# Patient Record
Sex: Female | Born: 1975
Health system: Southern US, Community
[De-identification: ages and names within clinical notes are randomized; demographics above are authoritative.]

## PROBLEM LIST (undated history)

## (undated) DIAGNOSIS — F329 Major depressive disorder, single episode, unspecified: Secondary | ICD-10-CM

## (undated) DIAGNOSIS — E538 Deficiency of other specified B group vitamins: Secondary | ICD-10-CM

## (undated) DIAGNOSIS — E669 Obesity, unspecified: Secondary | ICD-10-CM

## (undated) DIAGNOSIS — K209 Esophagitis, unspecified without bleeding: Secondary | ICD-10-CM

## (undated) DIAGNOSIS — G43909 Migraine, unspecified, not intractable, without status migrainosus: Secondary | ICD-10-CM

## (undated) DIAGNOSIS — M797 Fibromyalgia: Secondary | ICD-10-CM

## (undated) DIAGNOSIS — F32A Depression, unspecified: Secondary | ICD-10-CM

## (undated) DIAGNOSIS — G629 Polyneuropathy, unspecified: Secondary | ICD-10-CM

## (undated) DIAGNOSIS — E559 Vitamin D deficiency, unspecified: Secondary | ICD-10-CM

## (undated) DIAGNOSIS — F419 Anxiety disorder, unspecified: Secondary | ICD-10-CM

## (undated) DIAGNOSIS — Z9289 Personal history of other medical treatment: Secondary | ICD-10-CM

## (undated) DIAGNOSIS — T7840XA Allergy, unspecified, initial encounter: Secondary | ICD-10-CM

## (undated) DIAGNOSIS — IMO0002 Reserved for concepts with insufficient information to code with codable children: Secondary | ICD-10-CM

## (undated) DIAGNOSIS — G90523 Complex regional pain syndrome I of lower limb, bilateral: Secondary | ICD-10-CM

## (undated) DIAGNOSIS — E785 Hyperlipidemia, unspecified: Secondary | ICD-10-CM

## (undated) DIAGNOSIS — I2699 Other pulmonary embolism without acute cor pulmonale: Secondary | ICD-10-CM

## (undated) DIAGNOSIS — M1990 Unspecified osteoarthritis, unspecified site: Secondary | ICD-10-CM

## (undated) DIAGNOSIS — M48061 Spinal stenosis, lumbar region without neurogenic claudication: Secondary | ICD-10-CM

## (undated) DIAGNOSIS — K219 Gastro-esophageal reflux disease without esophagitis: Secondary | ICD-10-CM

## (undated) DIAGNOSIS — F1190 Opioid use, unspecified, uncomplicated: Secondary | ICD-10-CM

## (undated) DIAGNOSIS — I499 Cardiac arrhythmia, unspecified: Secondary | ICD-10-CM

## (undated) DIAGNOSIS — Z87442 Personal history of urinary calculi: Secondary | ICD-10-CM

## (undated) DIAGNOSIS — E282 Polycystic ovarian syndrome: Secondary | ICD-10-CM

## (undated) HISTORY — DX: Personal history of other medical treatment: Z92.89

## (undated) HISTORY — DX: Fibromyalgia: M79.7

## (undated) HISTORY — DX: Gastro-esophageal reflux disease without esophagitis: K21.9

## (undated) HISTORY — DX: Obesity, unspecified: E66.9

## (undated) HISTORY — DX: Depression, unspecified: F32.A

## (undated) HISTORY — PX: TONSILLECTOMY: SUR1361

## (undated) HISTORY — DX: Major depressive disorder, single episode, unspecified: F32.9

## (undated) HISTORY — PX: APPENDECTOMY: SHX54

## (undated) HISTORY — PX: OVARIAN CYST REMOVAL: SHX89

## (undated) HISTORY — DX: Allergy, unspecified, initial encounter: T78.40XA

## (undated) HISTORY — PX: VAGINAL HYSTERECTOMY: SUR661

## (undated) HISTORY — PX: WRIST GANGLION EXCISION: SUR520

## (undated) HISTORY — DX: Polycystic ovarian syndrome: E28.2

## (undated) HISTORY — DX: Deficiency of other specified B group vitamins: E53.8

## (undated) HISTORY — PX: OOPHORECTOMY: SHX86

## (undated) HISTORY — DX: Complex regional pain syndrome i of lower limb, bilateral: G90.523

## (undated) HISTORY — DX: Polyneuropathy, unspecified: G62.9

## (undated) HISTORY — DX: Hyperlipidemia, unspecified: E78.5

## (undated) HISTORY — PX: RECTOCELE REPAIR: SHX761

## (undated) HISTORY — PX: TONSILLECTOMY: SHX5217

## (undated) HISTORY — DX: Anxiety disorder, unspecified: F41.9

---

## 1997-08-21 ENCOUNTER — Other Ambulatory Visit: Admission: RE | Admit: 1997-08-21 | Discharge: 1997-08-21 | Payer: Self-pay | Admitting: *Deleted

## 1998-07-05 ENCOUNTER — Inpatient Hospital Stay (HOSPITAL_COMMUNITY): Admission: EM | Admit: 1998-07-05 | Discharge: 1998-07-06 | Payer: Self-pay | Admitting: Emergency Medicine

## 1998-07-05 ENCOUNTER — Encounter: Payer: Self-pay | Admitting: Emergency Medicine

## 1998-10-15 ENCOUNTER — Other Ambulatory Visit: Admission: RE | Admit: 1998-10-15 | Discharge: 1998-10-15 | Payer: Self-pay | Admitting: *Deleted

## 1999-02-02 ENCOUNTER — Inpatient Hospital Stay (HOSPITAL_COMMUNITY): Admission: AD | Admit: 1999-02-02 | Discharge: 1999-02-02 | Payer: Self-pay | Admitting: Obstetrics and Gynecology

## 1999-02-07 ENCOUNTER — Inpatient Hospital Stay (HOSPITAL_COMMUNITY): Admission: AD | Admit: 1999-02-07 | Discharge: 1999-02-07 | Payer: Self-pay | Admitting: *Deleted

## 1999-02-11 ENCOUNTER — Ambulatory Visit (HOSPITAL_COMMUNITY): Admission: RE | Admit: 1999-02-11 | Discharge: 1999-02-11 | Payer: Self-pay | Admitting: Obstetrics and Gynecology

## 1999-03-06 ENCOUNTER — Encounter: Payer: Self-pay | Admitting: Obstetrics and Gynecology

## 1999-03-06 ENCOUNTER — Inpatient Hospital Stay (HOSPITAL_COMMUNITY): Admission: AD | Admit: 1999-03-06 | Discharge: 1999-03-06 | Payer: Self-pay | Admitting: Obstetrics and Gynecology

## 1999-04-04 ENCOUNTER — Inpatient Hospital Stay (HOSPITAL_COMMUNITY): Admission: AD | Admit: 1999-04-04 | Discharge: 1999-04-04 | Payer: Self-pay | Admitting: Obstetrics and Gynecology

## 1999-04-06 ENCOUNTER — Inpatient Hospital Stay (HOSPITAL_COMMUNITY): Admission: AD | Admit: 1999-04-06 | Discharge: 1999-04-06 | Payer: Self-pay | Admitting: *Deleted

## 1999-04-08 ENCOUNTER — Inpatient Hospital Stay (HOSPITAL_COMMUNITY): Admission: AD | Admit: 1999-04-08 | Discharge: 1999-04-08 | Payer: Self-pay | Admitting: Obstetrics and Gynecology

## 1999-04-16 ENCOUNTER — Inpatient Hospital Stay (HOSPITAL_COMMUNITY): Admission: AD | Admit: 1999-04-16 | Discharge: 1999-04-19 | Payer: Self-pay | Admitting: *Deleted

## 1999-04-24 ENCOUNTER — Inpatient Hospital Stay (HOSPITAL_COMMUNITY): Admission: AD | Admit: 1999-04-24 | Discharge: 1999-04-24 | Payer: Self-pay | Admitting: Obstetrics and Gynecology

## 1999-05-29 ENCOUNTER — Other Ambulatory Visit: Admission: RE | Admit: 1999-05-29 | Discharge: 1999-05-29 | Payer: Self-pay | Admitting: *Deleted

## 1999-07-03 ENCOUNTER — Encounter (INDEPENDENT_AMBULATORY_CARE_PROVIDER_SITE_OTHER): Payer: Self-pay | Admitting: Specialist

## 1999-07-03 ENCOUNTER — Inpatient Hospital Stay (HOSPITAL_COMMUNITY): Admission: AD | Admit: 1999-07-03 | Discharge: 1999-07-05 | Payer: Self-pay | Admitting: *Deleted

## 1999-09-28 ENCOUNTER — Encounter: Payer: Self-pay | Admitting: Emergency Medicine

## 1999-09-28 ENCOUNTER — Emergency Department (HOSPITAL_COMMUNITY): Admission: EM | Admit: 1999-09-28 | Discharge: 1999-09-28 | Payer: Self-pay | Admitting: Emergency Medicine

## 1999-12-30 ENCOUNTER — Ambulatory Visit (HOSPITAL_COMMUNITY): Admission: RE | Admit: 1999-12-30 | Discharge: 1999-12-30 | Payer: Self-pay | Admitting: *Deleted

## 2000-06-28 ENCOUNTER — Other Ambulatory Visit: Admission: RE | Admit: 2000-06-28 | Discharge: 2000-06-28 | Payer: Self-pay | Admitting: *Deleted

## 2001-07-18 ENCOUNTER — Inpatient Hospital Stay (HOSPITAL_COMMUNITY): Admission: AD | Admit: 2001-07-18 | Discharge: 2001-07-18 | Payer: Self-pay | Admitting: Obstetrics and Gynecology

## 2001-07-18 ENCOUNTER — Encounter: Payer: Self-pay | Admitting: Obstetrics and Gynecology

## 2001-08-30 ENCOUNTER — Other Ambulatory Visit: Admission: RE | Admit: 2001-08-30 | Discharge: 2001-08-30 | Payer: Self-pay | Admitting: *Deleted

## 2002-04-30 ENCOUNTER — Ambulatory Visit (HOSPITAL_COMMUNITY): Admission: RE | Admit: 2002-04-30 | Discharge: 2002-04-30 | Payer: Self-pay | Admitting: Otolaryngology

## 2002-04-30 ENCOUNTER — Encounter: Payer: Self-pay | Admitting: Otolaryngology

## 2002-05-30 ENCOUNTER — Ambulatory Visit: Admission: RE | Admit: 2002-05-30 | Discharge: 2002-05-30 | Payer: Self-pay | Admitting: Neurology

## 2003-06-12 ENCOUNTER — Emergency Department (HOSPITAL_COMMUNITY): Admission: AD | Admit: 2003-06-12 | Discharge: 2003-06-12 | Payer: Self-pay | Admitting: Family Medicine

## 2003-07-06 ENCOUNTER — Other Ambulatory Visit: Admission: RE | Admit: 2003-07-06 | Discharge: 2003-07-06 | Payer: Self-pay | Admitting: *Deleted

## 2004-02-29 ENCOUNTER — Ambulatory Visit (HOSPITAL_COMMUNITY): Admission: RE | Admit: 2004-02-29 | Discharge: 2004-02-29 | Payer: Self-pay | Admitting: Unknown Physician Specialty

## 2004-09-17 ENCOUNTER — Emergency Department (HOSPITAL_COMMUNITY): Admission: EM | Admit: 2004-09-17 | Discharge: 2004-09-18 | Payer: Self-pay | Admitting: Emergency Medicine

## 2004-09-18 ENCOUNTER — Ambulatory Visit (HOSPITAL_COMMUNITY): Admission: RE | Admit: 2004-09-18 | Discharge: 2004-09-18 | Payer: Self-pay | Admitting: Emergency Medicine

## 2005-03-04 ENCOUNTER — Encounter (INDEPENDENT_AMBULATORY_CARE_PROVIDER_SITE_OTHER): Payer: Self-pay | Admitting: *Deleted

## 2005-03-04 ENCOUNTER — Ambulatory Visit (HOSPITAL_COMMUNITY): Admission: RE | Admit: 2005-03-04 | Discharge: 2005-03-04 | Payer: Self-pay | Admitting: Obstetrics and Gynecology

## 2006-01-16 ENCOUNTER — Emergency Department (HOSPITAL_COMMUNITY): Admission: EM | Admit: 2006-01-16 | Discharge: 2006-01-16 | Payer: Self-pay | Admitting: Emergency Medicine

## 2006-06-24 ENCOUNTER — Emergency Department (HOSPITAL_COMMUNITY): Admission: EM | Admit: 2006-06-24 | Discharge: 2006-06-24 | Payer: Self-pay | Admitting: Family Medicine

## 2006-07-28 ENCOUNTER — Ambulatory Visit (HOSPITAL_COMMUNITY): Admission: RE | Admit: 2006-07-28 | Discharge: 2006-07-28 | Payer: Self-pay | Admitting: Neurology

## 2006-08-17 ENCOUNTER — Emergency Department (HOSPITAL_COMMUNITY): Admission: EM | Admit: 2006-08-17 | Discharge: 2006-08-17 | Payer: Self-pay | Admitting: Family Medicine

## 2006-08-30 ENCOUNTER — Ambulatory Visit: Payer: Self-pay | Admitting: Gastroenterology

## 2006-09-01 ENCOUNTER — Ambulatory Visit: Payer: Self-pay | Admitting: Family Medicine

## 2006-09-02 ENCOUNTER — Ambulatory Visit: Payer: Self-pay | Admitting: Gastroenterology

## 2006-09-20 ENCOUNTER — Emergency Department (HOSPITAL_COMMUNITY): Admission: EM | Admit: 2006-09-20 | Discharge: 2006-09-20 | Payer: Self-pay | Admitting: Family Medicine

## 2006-09-27 ENCOUNTER — Ambulatory Visit: Payer: Self-pay | Admitting: Family Medicine

## 2006-09-27 LAB — CONVERTED CEMR LAB
ALT: 13 units/L (ref 0–40)
AST: 15 units/L (ref 0–37)
Albumin: 4.2 g/dL (ref 3.5–5.2)
Alkaline Phosphatase: 49 units/L (ref 39–117)
BUN: 13 mg/dL (ref 6–23)
Basophils Absolute: 0 10*3/uL (ref 0.0–0.1)
Basophils Relative: 0.4 % (ref 0.0–1.0)
Bilirubin, Direct: 0.1 mg/dL (ref 0.0–0.3)
CO2: 32 meq/L (ref 19–32)
Calcium: 9.2 mg/dL (ref 8.4–10.5)
Chloride: 109 meq/L (ref 96–112)
Cholesterol: 218 mg/dL (ref 0–200)
Creatinine, Ser: 0.9 mg/dL (ref 0.4–1.2)
Direct LDL: 163.5 mg/dL
Eosinophils Absolute: 0.2 10*3/uL (ref 0.0–0.6)
Eosinophils Relative: 3.3 % (ref 0.0–5.0)
GFR calc Af Amer: 93 mL/min
GFR calc non Af Amer: 77 mL/min
Glucose, Bld: 88 mg/dL (ref 70–99)
HCT: 42 % (ref 36.0–46.0)
HDL: 36.5 mg/dL — ABNORMAL LOW (ref >39.0)
Hemoglobin: 14.5 g/dL (ref 12.0–15.0)
Lymphocytes Relative: 33.4 % (ref 12.0–46.0)
MCHC: 34.5 g/dL (ref 30.0–36.0)
MCV: 90.1 fL (ref 78.0–100.0)
Monocytes Absolute: 0.3 10*3/uL (ref 0.2–0.7)
Monocytes Relative: 5.7 % (ref 3.0–11.0)
Neutro Abs: 3.3 10*3/uL (ref 1.4–7.7)
Neutrophils Relative %: 57.2 % (ref 43.0–77.0)
Platelets: 236 10*3/uL (ref 150–400)
Potassium: 4.3 meq/L (ref 3.5–5.1)
RBC: 4.66 M/uL (ref 3.87–5.11)
RDW: 11.3 % — ABNORMAL LOW (ref 11.5–14.6)
Sodium: 145 meq/L (ref 135–145)
TSH: 0.49 microintl units/mL (ref 0.35–5.50)
Total Bilirubin: 0.7 mg/dL (ref 0.3–1.2)
Total CHOL/HDL Ratio: 6
Total Protein: 6.8 g/dL (ref 6.0–8.3)
Triglycerides: 112 mg/dL (ref 0–149)
VLDL: 22 mg/dL (ref 0–40)
WBC: 5.7 10*3/uL (ref 4.5–10.5)

## 2006-10-11 ENCOUNTER — Ambulatory Visit: Payer: Self-pay | Admitting: Family Medicine

## 2006-10-13 ENCOUNTER — Encounter: Admission: RE | Admit: 2006-10-13 | Discharge: 2006-10-13 | Payer: Self-pay | Admitting: Family Medicine

## 2006-11-20 ENCOUNTER — Emergency Department (HOSPITAL_COMMUNITY): Admission: EM | Admit: 2006-11-20 | Discharge: 2006-11-20 | Payer: Self-pay | Admitting: Emergency Medicine

## 2006-12-14 HISTORY — PX: SINUSOTOMY: SHX291

## 2007-01-04 ENCOUNTER — Ambulatory Visit: Payer: Self-pay | Admitting: Family Medicine

## 2007-01-04 DIAGNOSIS — K219 Gastro-esophageal reflux disease without esophagitis: Secondary | ICD-10-CM | POA: Insufficient documentation

## 2007-01-04 DIAGNOSIS — M1710 Unilateral primary osteoarthritis, unspecified knee: Secondary | ICD-10-CM

## 2007-01-04 DIAGNOSIS — R51 Headache: Secondary | ICD-10-CM

## 2007-01-04 DIAGNOSIS — IMO0002 Reserved for concepts with insufficient information to code with codable children: Secondary | ICD-10-CM | POA: Insufficient documentation

## 2007-01-04 DIAGNOSIS — R519 Headache, unspecified: Secondary | ICD-10-CM | POA: Insufficient documentation

## 2007-01-04 DIAGNOSIS — J309 Allergic rhinitis, unspecified: Secondary | ICD-10-CM | POA: Insufficient documentation

## 2007-01-04 DIAGNOSIS — Z87442 Personal history of urinary calculi: Secondary | ICD-10-CM | POA: Insufficient documentation

## 2007-01-04 DIAGNOSIS — F411 Generalized anxiety disorder: Secondary | ICD-10-CM | POA: Insufficient documentation

## 2007-02-07 ENCOUNTER — Ambulatory Visit: Payer: Self-pay | Admitting: Family Medicine

## 2007-04-04 ENCOUNTER — Ambulatory Visit: Payer: Self-pay | Admitting: Family Medicine

## 2007-04-04 DIAGNOSIS — N3000 Acute cystitis without hematuria: Secondary | ICD-10-CM | POA: Insufficient documentation

## 2007-04-04 LAB — CONVERTED CEMR LAB
Bilirubin Urine: NEGATIVE
Blood in Urine, dipstick: NEGATIVE
Glucose, Urine, Semiquant: NEGATIVE
Ketones, urine, test strip: NEGATIVE
Nitrite: NEGATIVE
Protein, U semiquant: NEGATIVE
Specific Gravity, Urine: 1.02
Urobilinogen, UA: 0.2
WBC Urine, dipstick: NEGATIVE
pH: 7.5

## 2007-04-07 ENCOUNTER — Ambulatory Visit: Payer: Self-pay | Admitting: Family Medicine

## 2007-04-07 ENCOUNTER — Telehealth: Payer: Self-pay | Admitting: Family Medicine

## 2007-04-22 ENCOUNTER — Telehealth: Payer: Self-pay | Admitting: Family Medicine

## 2007-05-12 ENCOUNTER — Telehealth: Payer: Self-pay | Admitting: Family Medicine

## 2007-07-10 ENCOUNTER — Emergency Department (HOSPITAL_COMMUNITY): Admission: EM | Admit: 2007-07-10 | Discharge: 2007-07-10 | Payer: Self-pay | Admitting: Family Medicine

## 2007-08-22 ENCOUNTER — Ambulatory Visit: Payer: Self-pay | Admitting: Family Medicine

## 2007-08-22 DIAGNOSIS — K5900 Constipation, unspecified: Secondary | ICD-10-CM | POA: Insufficient documentation

## 2007-08-30 ENCOUNTER — Ambulatory Visit (HOSPITAL_COMMUNITY): Admission: RE | Admit: 2007-08-30 | Discharge: 2007-08-30 | Payer: Self-pay | Admitting: Sports Medicine

## 2007-09-21 ENCOUNTER — Telehealth: Payer: Self-pay | Admitting: Family Medicine

## 2007-11-19 ENCOUNTER — Emergency Department (HOSPITAL_COMMUNITY): Admission: EM | Admit: 2007-11-19 | Discharge: 2007-11-19 | Payer: Self-pay | Admitting: Emergency Medicine

## 2007-12-23 ENCOUNTER — Encounter: Payer: Self-pay | Admitting: Family Medicine

## 2007-12-27 ENCOUNTER — Telehealth: Payer: Self-pay | Admitting: Family Medicine

## 2008-01-04 ENCOUNTER — Encounter: Admission: RE | Admit: 2008-01-04 | Discharge: 2008-01-04 | Payer: Self-pay | Admitting: Sports Medicine

## 2008-01-23 ENCOUNTER — Telehealth: Payer: Self-pay | Admitting: Family Medicine

## 2008-01-25 ENCOUNTER — Encounter: Admission: RE | Admit: 2008-01-25 | Discharge: 2008-01-25 | Payer: Self-pay | Admitting: Sports Medicine

## 2008-02-27 ENCOUNTER — Ambulatory Visit: Payer: Self-pay | Admitting: Family Medicine

## 2008-02-27 DIAGNOSIS — G609 Hereditary and idiopathic neuropathy, unspecified: Secondary | ICD-10-CM | POA: Insufficient documentation

## 2008-02-27 DIAGNOSIS — R609 Edema, unspecified: Secondary | ICD-10-CM | POA: Insufficient documentation

## 2008-03-01 ENCOUNTER — Telehealth: Payer: Self-pay | Admitting: Family Medicine

## 2008-03-01 LAB — CONVERTED CEMR LAB
Basophils Absolute: 0 10*3/uL (ref 0.0–0.1)
Basophils Relative: 0 % (ref 0.0–3.0)
Eosinophils Absolute: 0.2 10*3/uL (ref 0.0–0.7)
Eosinophils Relative: 2.2 % (ref 0.0–5.0)
HCT: 40.5 % (ref 36.0–46.0)
Hemoglobin: 14.3 g/dL (ref 12.0–15.0)
Lymphocytes Relative: 34.6 % (ref 12.0–46.0)
MCHC: 35.3 g/dL (ref 30.0–36.0)
MCV: 90.3 fL (ref 78.0–100.0)
Monocytes Absolute: 0.2 10*3/uL (ref 0.1–1.0)
Monocytes Relative: 2.9 % — ABNORMAL LOW (ref 3.0–12.0)
Neutro Abs: 4.9 10*3/uL (ref 1.4–7.7)
Neutrophils Relative %: 60.3 % (ref 43.0–77.0)
Platelets: 271 10*3/uL (ref 150–400)
RBC: 4.48 M/uL (ref 3.87–5.11)
RDW: 12.5 % (ref 11.5–14.6)
Vitamin B-12: 195 pg/mL — ABNORMAL LOW (ref 211–911)
WBC: 8.1 10*3/uL (ref 4.5–10.5)

## 2008-03-02 ENCOUNTER — Telehealth: Payer: Self-pay | Admitting: Family Medicine

## 2008-03-09 ENCOUNTER — Ambulatory Visit: Payer: Self-pay | Admitting: Family Medicine

## 2008-03-20 ENCOUNTER — Telehealth: Payer: Self-pay | Admitting: Family Medicine

## 2008-03-23 ENCOUNTER — Ambulatory Visit: Payer: Self-pay | Admitting: Family Medicine

## 2008-03-23 DIAGNOSIS — E538 Deficiency of other specified B group vitamins: Secondary | ICD-10-CM | POA: Insufficient documentation

## 2008-05-23 ENCOUNTER — Telehealth: Payer: Self-pay | Admitting: Family Medicine

## 2008-05-25 ENCOUNTER — Emergency Department (HOSPITAL_BASED_OUTPATIENT_CLINIC_OR_DEPARTMENT_OTHER): Admission: EM | Admit: 2008-05-25 | Discharge: 2008-05-26 | Payer: Self-pay | Admitting: Emergency Medicine

## 2008-06-20 ENCOUNTER — Ambulatory Visit: Payer: Self-pay | Admitting: Family Medicine

## 2008-06-20 DIAGNOSIS — R5383 Other fatigue: Secondary | ICD-10-CM

## 2008-06-20 DIAGNOSIS — R5381 Other malaise: Secondary | ICD-10-CM | POA: Insufficient documentation

## 2008-06-20 DIAGNOSIS — G4700 Insomnia, unspecified: Secondary | ICD-10-CM | POA: Insufficient documentation

## 2008-06-22 ENCOUNTER — Telehealth: Payer: Self-pay | Admitting: Family Medicine

## 2008-06-22 LAB — CONVERTED CEMR LAB
ALT: 17 units/L (ref 0–35)
AST: 15 units/L (ref 0–37)
Albumin: 3.9 g/dL (ref 3.5–5.2)
Alkaline Phosphatase: 50 units/L (ref 39–117)
BUN: 15 mg/dL (ref 6–23)
Basophils Absolute: 0 10*3/uL (ref 0.0–0.1)
Basophils Relative: 0.6 % (ref 0.0–3.0)
Bilirubin, Direct: 0.1 mg/dL (ref 0.0–0.3)
CO2: 31 meq/L (ref 19–32)
Calcium: 8.9 mg/dL (ref 8.4–10.5)
Chloride: 108 meq/L (ref 96–112)
Creatinine, Ser: 0.8 mg/dL (ref 0.4–1.2)
Eosinophils Absolute: 0.2 10*3/uL (ref 0.0–0.7)
Eosinophils Relative: 2.4 % (ref 0.0–5.0)
GFR calc Af Amer: 106 mL/min
GFR calc non Af Amer: 88 mL/min
Glucose, Bld: 96 mg/dL (ref 70–99)
HCT: 42.5 % (ref 36.0–46.0)
Hemoglobin: 14.9 g/dL (ref 12.0–15.0)
Lymphocytes Relative: 36.5 % (ref 12.0–46.0)
MCHC: 35.1 g/dL (ref 30.0–36.0)
MCV: 90.5 fL (ref 78.0–100.0)
Monocytes Absolute: 0.4 10*3/uL (ref 0.1–1.0)
Monocytes Relative: 6.6 % (ref 3.0–12.0)
Neutro Abs: 3.6 10*3/uL (ref 1.4–7.7)
Neutrophils Relative %: 53.9 % (ref 43.0–77.0)
Platelets: 269 10*3/uL (ref 150–400)
Potassium: 4.2 meq/L (ref 3.5–5.1)
RBC: 4.69 M/uL (ref 3.87–5.11)
RDW: 11.7 % (ref 11.5–14.6)
Sodium: 142 meq/L (ref 135–145)
TSH: 1.16 microintl units/mL (ref 0.35–5.50)
Total Bilirubin: 0.7 mg/dL (ref 0.3–1.2)
Total Protein: 7 g/dL (ref 6.0–8.3)
Vitamin B-12: 1117 pg/mL — ABNORMAL HIGH (ref 211–911)
WBC: 6.6 10*3/uL (ref 4.5–10.5)

## 2008-08-09 ENCOUNTER — Telehealth: Payer: Self-pay | Admitting: Family Medicine

## 2008-08-10 ENCOUNTER — Telehealth: Payer: Self-pay | Admitting: Family Medicine

## 2008-08-10 ENCOUNTER — Ambulatory Visit: Payer: Self-pay | Admitting: Family Medicine

## 2008-08-10 DIAGNOSIS — L708 Other acne: Secondary | ICD-10-CM | POA: Insufficient documentation

## 2008-08-10 DIAGNOSIS — E669 Obesity, unspecified: Secondary | ICD-10-CM | POA: Insufficient documentation

## 2008-08-13 ENCOUNTER — Encounter: Payer: Self-pay | Admitting: Family Medicine

## 2008-08-14 ENCOUNTER — Telehealth: Payer: Self-pay | Admitting: Family Medicine

## 2008-09-12 ENCOUNTER — Encounter: Payer: Self-pay | Admitting: Family Medicine

## 2008-10-11 ENCOUNTER — Ambulatory Visit: Payer: Self-pay | Admitting: Family Medicine

## 2008-10-11 DIAGNOSIS — M791 Myalgia, unspecified site: Secondary | ICD-10-CM | POA: Insufficient documentation

## 2008-11-09 ENCOUNTER — Telehealth: Payer: Self-pay | Admitting: Family Medicine

## 2009-01-23 ENCOUNTER — Telehealth: Payer: Self-pay | Admitting: Family Medicine

## 2009-02-11 ENCOUNTER — Telehealth: Payer: Self-pay | Admitting: Family Medicine

## 2009-04-05 ENCOUNTER — Telehealth: Payer: Self-pay | Admitting: Family Medicine

## 2009-04-08 ENCOUNTER — Ambulatory Visit: Payer: Self-pay | Admitting: Family Medicine

## 2009-04-08 DIAGNOSIS — F329 Major depressive disorder, single episode, unspecified: Secondary | ICD-10-CM

## 2009-04-08 DIAGNOSIS — F32A Depression, unspecified: Secondary | ICD-10-CM | POA: Insufficient documentation

## 2009-06-07 ENCOUNTER — Telehealth: Payer: Self-pay | Admitting: Family Medicine

## 2009-06-21 ENCOUNTER — Ambulatory Visit: Payer: Self-pay | Admitting: Family Medicine

## 2009-06-21 DIAGNOSIS — J209 Acute bronchitis, unspecified: Secondary | ICD-10-CM | POA: Insufficient documentation

## 2009-07-11 ENCOUNTER — Telehealth: Payer: Self-pay | Admitting: Family Medicine

## 2009-08-09 ENCOUNTER — Telehealth: Payer: Self-pay | Admitting: Family Medicine

## 2009-10-09 ENCOUNTER — Ambulatory Visit (HOSPITAL_COMMUNITY): Admission: RE | Admit: 2009-10-09 | Discharge: 2009-10-09 | Payer: Self-pay | Admitting: Sports Medicine

## 2009-10-18 ENCOUNTER — Encounter: Payer: Self-pay | Admitting: Family Medicine

## 2009-10-21 ENCOUNTER — Ambulatory Visit: Payer: Self-pay | Admitting: Family Medicine

## 2009-10-25 ENCOUNTER — Telehealth: Payer: Self-pay | Admitting: Family Medicine

## 2009-10-31 ENCOUNTER — Ambulatory Visit: Payer: Self-pay | Admitting: Internal Medicine

## 2009-10-31 DIAGNOSIS — R300 Dysuria: Secondary | ICD-10-CM | POA: Insufficient documentation

## 2009-10-31 DIAGNOSIS — N390 Urinary tract infection, site not specified: Secondary | ICD-10-CM | POA: Insufficient documentation

## 2009-11-04 ENCOUNTER — Telehealth: Payer: Self-pay | Admitting: Internal Medicine

## 2009-11-08 ENCOUNTER — Encounter: Payer: Self-pay | Admitting: Family Medicine

## 2009-11-11 ENCOUNTER — Telehealth: Payer: Self-pay | Admitting: Internal Medicine

## 2009-11-15 ENCOUNTER — Telehealth: Payer: Self-pay | Admitting: Family Medicine

## 2009-11-20 ENCOUNTER — Telehealth: Payer: Self-pay | Admitting: Family Medicine

## 2009-12-17 ENCOUNTER — Telehealth: Payer: Self-pay | Admitting: Family Medicine

## 2009-12-23 ENCOUNTER — Ambulatory Visit: Payer: Self-pay | Admitting: Family Medicine

## 2010-01-01 ENCOUNTER — Encounter: Payer: Self-pay | Admitting: Family Medicine

## 2010-01-28 ENCOUNTER — Ambulatory Visit: Payer: Self-pay | Admitting: Family Medicine

## 2010-01-28 ENCOUNTER — Telehealth: Payer: Self-pay | Admitting: Family Medicine

## 2010-01-28 DIAGNOSIS — H6120 Impacted cerumen, unspecified ear: Secondary | ICD-10-CM

## 2010-02-02 ENCOUNTER — Emergency Department (HOSPITAL_BASED_OUTPATIENT_CLINIC_OR_DEPARTMENT_OTHER): Admission: EM | Admit: 2010-02-02 | Discharge: 2010-02-02 | Payer: Self-pay | Admitting: Emergency Medicine

## 2010-02-12 ENCOUNTER — Encounter: Payer: Self-pay | Admitting: Family Medicine

## 2010-03-04 ENCOUNTER — Telehealth: Payer: Self-pay | Admitting: Family Medicine

## 2010-04-02 ENCOUNTER — Ambulatory Visit: Payer: Self-pay | Admitting: Family Medicine

## 2010-05-15 ENCOUNTER — Telehealth: Payer: Self-pay | Admitting: Family Medicine

## 2010-06-01 HISTORY — PX: OTHER SURGICAL HISTORY: SHX169

## 2010-06-01 HISTORY — PX: BACK SURGERY: SHX140

## 2010-06-22 ENCOUNTER — Encounter: Payer: Self-pay | Admitting: Sports Medicine

## 2010-07-01 NOTE — Progress Notes (Signed)
Summary: phenergan requested  Phone Note Call from Patient   Caller: Patient Call For: [name] MD Summary of Call: Pt is having a migraine and would like a RX for oral phenergan [phone] Initial call taken by: Lynann Beaver CMA,  August 09, 2008 10:41 AM  Follow-up for Phone Call        please call her and see how she is doing this am Follow-up by: [name] MD,  August 10, 2008 8:38 AM  Additional Follow-up for Phone Call Additional follow up Details #1::        Called the patient.   She still has the headace.  Does have migraine headache med.  The nausea still there.  It comes & goes.  The Phenergan you gave her before helped with nausea.  [facility]  NK[facility]. Additional Follow-up by: Rudy Jew, RN,  August 10, 2008 9:32 AM    Additional Follow-up for Phone Call Additional follow up Details #2::    call in Phenergan 25 mg tabs q 4 hours as needed nausea, #60 with 5 rf Follow-up by: [name] MD,  August 10, 2008 10:24 AM  Additional Follow-up for Phone Call Additional follow up Details #3:: Details for Additional Follow-up Action Taken: Done & aware.  She has made appt this pm for sinus infection, she thinks. Additional Follow-up by: Rudy Jew, RN,  August 10, 2008 11:01 AM  New/Updated Medications: PROMETHAZINE HCL 25 MG TABS (PROMETHAZINE HCL) One every 4 hours as needed nausea   Prescriptions: PROMETHAZINE HCL 25 MG TABS (PROMETHAZINE HCL) One every 4 hours as needed nausea  #60 x 5   Entered by:   Rudy Jew, RN   Authorized by:   [name] MD   Signed by:   Rudy Jew, RN on 08/10/2008   Method used:   [facility]ronically to        [facility] [phone][facility] (retail)       [address]       [place], [place]  [address]       Ph: [phone] or [phone]       Fax: [phone]   RxID:   [id number]

## 2010-07-01 NOTE — Progress Notes (Signed)
Summary: Pt req status of ENT referral. Pls call asap  Phone Note Call from Patient Call back at Home Phone [phone]   Caller: Patient Summary of Call: Pt called and is req status of ENT referral. Pts ear is hurting really bad. Pls call asap.   Initial call taken by: Lucy Antigua,  January 28, 2010 4:47 PM  Follow-up for Phone Call        refer to [facility]NT for left ear pain, needs to be seen TODAY  Follow-up by: [name] MD,  January 29, 2010 8:40 AM

## 2010-07-01 NOTE — Progress Notes (Signed)
Summary: refill vicodin  Phone Note Refill Request Message from:  Fax from Pharmacy on December 17, 2009 10:36 AM  Refills Requested: Medication #1:  VICODIN 5-500 MG  TABS three times a day as needed pain   Last Refilled: 11/04/2009  Method Requested: Fax to Local Pharmacy Initial call taken by: Raechel Ache, RN,  December 17, 2009 10:37 AM [name] [phone][facility]  Follow-up for Phone Call        call in #60 with 2 rf Follow-up by: [name] MD,  December 17, 2009 3:47 PM    Prescriptions: VICODIN 5-500 MG  TABS (HYDROCODONE-ACETAMINOPHEN) three times a day as needed pain  #60 x 2   Entered by:   Raechel Ache, RN   Authorized by:   [name] MD   Signed by:   Raechel Ache, RN on 12/17/2009   Method used:   Historical   RxID:   [id number]

## 2010-07-01 NOTE — Assessment & Plan Note (Signed)
Summary: MEDICATION CONCERNS // RS   Vital Signs:  Patient profile:   35 year old female Weight:      286 pounds BMI:     41.19 BP sitting:   118 / 100  (left arm) Cuff size:   large  Vitals Entered By: Raechel Ache, RN (Oct 21, 2009 1:11 PM) CC: Talk about depression.   History of Present Illness: here to discuss her Celexa. She has been on it for several years, and it seems to have lost its effectiveness. She gets very depressed and tearful at times. She has also been having trouble losing weight, despite attending Weight Watchers and exercising 3 days a week. She thinks the Celexa is interfewring with this.   Allergies: No Known Drug Allergies  Past History:  Past Medical History: Reviewed history from 04/08/2009 and no changes required. Allergic rhinitis Headache GERD polycystic ovaries syndrome B12 deficiency peripheral neuropathy sees Dr. Henderson Cloud for GYN exams fibromyalgia Depression  Review of Systems  The patient denies anorexia, fever, weight loss, weight gain, vision loss, decreased hearing, hoarseness, chest pain, syncope, dyspnea on exertion, peripheral edema, prolonged cough, headaches, hemoptysis, abdominal pain, melena, hematochezia, severe indigestion/heartburn, hematuria, incontinence, genital sores, muscle weakness, suspicious skin lesions, transient blindness, difficulty walking, unusual weight change, abnormal bleeding, enlarged lymph nodes, angioedema, breast masses, and testicular masses.    Physical Exam  General:  overweight-appearing.   Psych:  Oriented X3, memory intact for recent and remote, normally interactive, good eye contact, depressed affect, and tearful.     Impression & Recommendations:  Problem # 1:  DEPRESSION (ICD-311)  The following medications were removed from the medication list:    Celexa 40 Mg Tabs (Citalopram hydrobromide) ..... Once daily Her updated medication list for this problem includes:    Alprazolam 0.5 Mg Tabs  (Alprazolam) .Marland Kitchen... Three times a day as needed anxiety    Wellbutrin Xl 150 Mg Xr24h-tab (Bupropion hcl) ..... Once daily  Problem # 2:  MORBID OBESITY (ICD-278.01)  Complete Medication List: 1)  Alprazolam 0.5 Mg Tabs (Alprazolam) .... Three times a day as needed anxiety 2)  Vicodin 5-500 Mg Tabs (Hydrocodone-acetaminophen) .... Three times a day as needed pain 3)  Promethazine Hcl 25 Mg Tabs (Promethazine hcl) .... One every 4 hours as needed nausea 4)  Hydrochlorothiazide 12.5 Mg Tabs (Hydrochlorothiazide) .... Take 1 tab  by mouth every morning 5)  Flexeril 10 Mg Tabs (Cyclobenzaprine hcl) .... Three times a day as needed spasm 6)  Pantoprazole Sodium 40 Mg Tbec (Pantoprazole sodium) .Marland Kitchen.. 1 by mouth once daily 7)  Ambien Cr 12.5 Mg Cr-tabs (Zolpidem tartrate) .... At bedtime 8)  Celebrex 200 Mg Caps (Celecoxib) .Marland Kitchen.. 1 once daily 9)  Wellbutrin Xl 150 Mg Xr24h-tab (Bupropion hcl) .... Once daily  Patient Instructions: 1)  Switch to Wwllbutrin.  2)  Please schedule a follow-up appointment in 1 month.  Prescriptions: WELLBUTRIN XL 150 MG XR24H-TAB (BUPROPION HCL) once daily  #30 x 2   Entered and Authorized by:   [name] MD   Signed by:   [name] MD on 10/21/2009   Method used:   Electronically to        [facility] [phone][facility] (retail)       [address]       [place], [place]  [address]       Ph: [phone] or [phone]       Fax: [phone]   RxID:   [id number]

## 2010-07-01 NOTE — Assessment & Plan Note (Signed)
Summary: problem with feet/jls   Vital Signs:  Patient Profile:   35 Years Old Female Weight:      268 pounds Temp:     98.4 degrees F oral Pulse rate:   100 / minute BP sitting:   122 / 84  (left arm) Cuff size:   large  Vitals Entered By: [name], CMA (February 27, 2008 3:30 PM)                 Chief Complaint:  sensations in feet.  History of Present Illness: For several months has had intermittent numbness or burning or tingling in both feet. Her back feels fine after she had the steroid shots, no more pain. Had fairly complete labs at Dr. Huel Coventry office in July which were normal including an A1c and a full thyroid panel. She has had trouble with swelling inher feet for many years.     Current Allergies: No known allergies   Past Medical History:    Reviewed history from 01/04/2007 and no changes required:       Allergic rhinitis       Headache       GERD       polycystic ovaries syndrome     Review of Systems  The patient denies anorexia, fever, weight loss, weight gain, vision loss, decreased hearing, hoarseness, chest pain, syncope, dyspnea on exertion, peripheral edema, prolonged cough, headaches, hemoptysis, abdominal pain, melena, hematochezia, severe indigestion/heartburn, hematuria, incontinence, genital sores, muscle weakness, suspicious skin lesions, transient blindness, difficulty walking, depression, unusual weight change, abnormal bleeding, enlarged lymph nodes, angioedema, breast masses, and testicular masses.     Physical Exam  General:     overweight-appearing.   Pulses:     pulses full in feet Extremities:     1+ left pedal edema and 1+ right pedal edema.   Neurologic:     cranial nerves II-XII intact, strength normal in all extremities, and sensation intact to light touch.      Impression & Recommendations:  Problem # 1:  PERIPHERAL NEUROPATHY (ICD-356.9)  Orders: Venipuncture (43329) TLB-CBC Platelet - w/Differential  (85025-CBCD) TLB-B12, Serum-Total ONLY (51884-Z66)   Problem # 2:  LEG EDEMA (ICD-782.3)  Her updated medication list for this problem includes:    Hydrochlorothiazide 12.5 Mg Tabs (Hydrochlorothiazide) ..... Once daily   Complete Medication List: 1)  Zyrtec Allergy 10 Mg Tabs (Cetirizine hcl) .... As needed 2)  Alprazolam 0.5 Mg Tabs (Alprazolam) .... Three times a day as needed anxiety 3)  Vicodin 5-500 Mg Tabs (Hydrocodone-acetaminophen) .... Three times a day as needed pain 4)  Fluconazole 150 Mg Tabs (Fluconazole) .Marland Kitchen.. 1 as needed 5)  Celexa 10 Mg Tabs (Citalopram hydrobromide) .... Once daily 6)  Pantoprazole Sodium 40 Mg Tbec (Pantoprazole sodium) .Marland Kitchen.. 1 each day 30 minutes before meal 7)  Hydrochlorothiazide 12.5 Mg Tabs (Hydrochlorothiazide) .... Once daily   Patient Instructions: 1)  Checl labs. Try low dose diuretic. Add a multivitamin.    Prescriptions: HYDROCHLOROTHIAZIDE 12.5 MG TABS (HYDROCHLOROTHIAZIDE) once daily  #30 x 11   Entered and Authorized by:   [name] MD   Signed by:   [name] MD on 02/27/2008   Method used:   Electronically to        [facility] [phone]* (retail)       [address]       [place], [place]  [address]       Ph: [phone] or [phone]  Fax: [phone]   RxID:   [id number]  ]

## 2010-07-01 NOTE — Progress Notes (Signed)
Summary: refill Vicodin  Phone Note Refill Request Message from:  Fax from Pharmacy on November 04, 2009 3:37 PM  Refills Requested: Medication #1:  VICODIN 5-500 MG  TABS three times a day as needed pain   Dosage confirmed as above?Dosage Confirmed   Last Refilled: 10/15/2009  Method Requested: Fax to Local Pharmacy Initial call taken by: Raechel Ache, RN,  November 04, 2009 3:38 PM [name] [phone][facility]  Follow-up for Phone Call        can refill  x 1 only  # 60  Vicodin  . Dr fry to do further refills  Follow-up by: Madelin Headings MD,  November 04, 2009 3:52 PM    Prescriptions: VICODIN 5-500 MG  TABS (HYDROCODONE-ACETAMINOPHEN) three times a day as needed pain  #60 x 0   Entered by:   Raechel Ache, RN   Authorized by:   Madelin Headings MD   Signed by:   Raechel Ache, RN on 11/04/2009   Method used:   Historical   RxID:   [id number]

## 2010-07-01 NOTE — Progress Notes (Signed)
Summary: refill xanax and hydrocodone  Phone Note From Pharmacy   [name] [phone][facility] Call For: [name]  Summary of Call: refill xanax 0.5mg  take 1 by mouth three times a day as needed for anxiety and hydrocodone 5/500 1 by mouth tid as needed for pain Initial call taken by: [name], CMA,  April 05, 2009 10:44 AM  Follow-up for Phone Call        call in Xanax #90 with 5 rf and Vicodin #60 with 3 rf Follow-up by: [name] MD,  April 05, 2009 2:40 PM  Additional Follow-up for Phone Call Additional follow up Details #1::        Phone call completed, Pharmacist called Additional Follow-up by: [name], CMA,  April 05, 2009 3:01 PM    Prescriptions: ALPRAZOLAM 0.5 MG  TABS (ALPRAZOLAM) three times a day as needed anxiety  #90 x 5   Entered by:   [name], CMA   Authorized by:   [name] MD   Signed by:   [name], CMA on 04/05/2009   Method used:   Telephoned to ...       [id number] (retail)       [address]       [place], [place]  [address]       Ph: [phone] or [phone]       Fax: [phone]   RxID:   [id number] VICODIN 5-500 MG  TABS (HYDROCODONE-ACETAMINOPHEN) three times a day as needed pain  #60 x 3   Entered by:   [name], CMA   Authorized by:   [name] MD   Signed by:   [name], CMA on 04/05/2009   Method used:   Telephoned to ...       [facility] [address] (retail)       [address]       [place], [place]  [address]       Ph: [phone] or [phone]       Fax: [phone]   RxID:   [id number]

## 2010-07-01 NOTE — Letter (Signed)
Summary: [facility]  [facility]   Imported By: Maryln Gottron 11/20/2009 13:35:27  _____________________________________________________________________  External Attachment:    Type:   Image     Comment:   External Document

## 2010-07-01 NOTE — Consult Note (Signed)
Summary: [facility]  [facility]   Imported By: Maryln Gottron 08/24/2008 14:58:08  _____________________________________________________________________  External Attachment:    Type:   Image     Comment:   External Document

## 2010-07-01 NOTE — Progress Notes (Signed)
Summary: pain in feet  Phone Note Call from Patient Call back at [phone]   Summary of Call: still has pain in feet, Vit B12 was a little high.  She has gotten all 12 shots and I told her to go down to once a month now but she is still having pain in her feet Initial call taken by: [name], CMA,  June 22, 2008 1:43 PM  Follow-up for Phone Call        we discussed how she may have fibromyalgia. refer to Dr. Pollyann Savoy to evaluate myalgias Follow-up by: [name] MD,  June 22, 2008 3:23 PM  Additional Follow-up for Phone Call Additional follow up Details #1::        Phone Call Completed Additional Follow-up by: [name], CMA,  June 22, 2008 5:31 PM

## 2010-07-01 NOTE — Progress Notes (Signed)
Summary: med question  Phone Note Call from Patient   Caller: Patient Call For: [name] MD Summary of Call: Pt would like to stop Phentermine, and wants to know if she has to wean off? [phone] Initial call taken by: [facility] CMA,  June 07, 2009 11:13 AM  Follow-up for Phone Call        no weaning is needed, simply stop it Follow-up by: [name] MD,  June 07, 2009 3:57 PM  Additional Follow-up for Phone Call Additional follow up Details #1::        Pt. notified. Additional Follow-up by: Lynann Beaver CMA,  June 10, 2009 8:04 AM

## 2010-07-01 NOTE — Progress Notes (Signed)
Summary: fatigue  Phone Note Call from Patient   Caller: Patient Call For: Dr Clent Ridges Summary of Call: Pt does not think the oral B12 is helping.  She is still very fatigued, and bilateral leg pain is worse.  Her insurance does not cover B12 injections. Does Dr. Clent Ridges have any suggestions? [phone] Initial call taken by: Lynann Beaver CMA,  March 20, 2008 12:53 PM  Follow-up for Phone Call        she can buy a vial of B12 (enough for 10 weeks of shots) at [facility] for $12 . We can give her rx for syringes and show her how to do it. Follow-up by: [name] MD,  March 20, 2008 4:38 PM  Additional Follow-up for Phone Call Additional follow up Details #1::        Spoke to pt and appt scheduled for B12 instructions. Additional Follow-up by: Lynann Beaver CMA,  March 21, 2008 11:57 AM

## 2010-07-01 NOTE — Assessment & Plan Note (Signed)
Summary: fu on bp/njr pt rsc due to stomach virus/njr   Vital Signs:  Patient profile:   35 year old female Weight:      278 pounds BMI:     40.03 BP sitting:   112 / 98  (left arm) Cuff size:   large  Vitals Entered By: Raechel Ache, RN (December 23, 2009 10:55 AM) CC: F/u BP.   History of Present Illness: Here to follow up on depression, sleep,and weight issues. She still struggles with some depression, and she would like to get back on celexa. She is attending Weight Watchers, and she has lost 8 lbs. Also for the past week she has had sinus pressure, HAs, PND, ST. and blowing green mucus from the nose. No cough or fever.   Allergies (verified): No Known Drug Allergies  Past History:  Past Medical History: Reviewed history from 04/08/2009 and no changes required. Allergic rhinitis Headache GERD polycystic ovaries syndrome B12 deficiency peripheral neuropathy sees Dr. Henderson Cloud for GYN exams fibromyalgia Depression  Review of Systems  The patient denies anorexia, fever, weight gain, vision loss, decreased hearing, hoarseness, chest pain, syncope, dyspnea on exertion, peripheral edema, prolonged cough, headaches, hemoptysis, abdominal pain, melena, hematochezia, severe indigestion/heartburn, hematuria, incontinence, genital sores, muscle weakness, suspicious skin lesions, transient blindness, difficulty walking, unusual weight change, abnormal bleeding, enlarged lymph nodes, angioedema, breast masses, and testicular masses.    Physical Exam  General:  overweight-appearing.   Neck:  No deformities, masses, or tenderness noted. Lungs:  Normal respiratory effort, chest expands symmetrically. Lungs are clear to auscultation, no crackles or wheezes. Heart:  Normal rate and regular rhythm. S1 and S2 normal without gallop, murmur, click, rub or other extra sounds. Psych:  Cognition and judgment appear intact. Alert and cooperative with normal attention span and concentration. No  apparent delusions, illusions, hallucinations   Impression & Recommendations:  Problem # 1:  DEPRESSION (ICD-311)  The following medications were removed from the medication list:    Wellbutrin Xl 150 Mg Xr24h-tab (Bupropion hcl) ..... Once daily Her updated medication list for this problem includes:    Alprazolam 0.5 Mg Tabs (Alprazolam) .Marland Kitchen... Three times a day as needed anxiety    Celexa 20 Mg Tabs (Citalopram hydrobromide) ..... Once daily  Problem # 2:  MORBID OBESITY (ICD-278.01)  Problem # 3:  SINUSITIS, ACUTE FRONTAL (ICD-461.1)  The following medications were removed from the medication list:    Ciprofloxacin Hcl 500 Mg Tabs (Ciprofloxacin hcl) ..... One twice daily Her updated medication list for this problem includes:    Levaquin 500 Mg Tabs (Levofloxacin) ..... Once daily  Complete Medication List: 1)  Alprazolam 0.5 Mg Tabs (Alprazolam) .... Three times a day as needed anxiety 2)  Vicodin 5-500 Mg Tabs (Hydrocodone-acetaminophen) .... Three times a day as needed pain 3)  Promethazine Hcl 25 Mg Tabs (Promethazine hcl) .... One every 4 hours as needed nausea 4)  Hydrochlorothiazide 12.5 Mg Tabs (Hydrochlorothiazide) .... Take 1 tab  by mouth every morning 5)  Flexeril 10 Mg Tabs (Cyclobenzaprine hcl) .... Three times a day as needed spasm 6)  Pantoprazole Sodium 40 Mg Tbec (Pantoprazole sodium) .Marland Kitchen.. 1 by mouth once daily 7)  Phentermine Hcl 37.5 Mg Caps (Phentermine hcl) .Marland Kitchen.. 1 once daily 8)  Levaquin 500 Mg Tabs (Levofloxacin) .... Once daily 9)  Fluconazole 150 Mg Tabs (Fluconazole) .... As needed 10)  Celexa 20 Mg Tabs (Citalopram hydrobromide) .... Once daily 11)  Zolpidem Tartrate 10 Mg Tabs (Zolpidem tartrate) .... At bedtime  Patient Instructions: 1)  Please schedule a follow-up appointment in 3 months .  Prescriptions: ZOLPIDEM TARTRATE 10 MG TABS (ZOLPIDEM TARTRATE) at bedtime  #90 x 1   Entered and Authorized by:   [name] MD   Signed by:   [name] MD on 12/23/2009   Method used:   Print then Give to Patient   RxID:   [id number] AMBIEN CR 12.5 MG CR-TABS (ZOLPIDEM TARTRATE) at bedtime  #90 x 1   Entered and Authorized by:   [name] MD   Signed by:   [name] MD on 12/23/2009   Method used:   Print then Give to Patient   RxID:   [id number] CELEXA 20 MG TABS (CITALOPRAM HYDROBROMIDE) once daily  #30 x 11   Entered and Authorized by:   [name] MD   Signed by:   [name] MD on 12/23/2009   Method used:   Electronically to        [facility] [phone]* (retail)       [address]       [place], [place]  [address]       Ph: [phone] or [phone]       Fax: [phone]   RxID:   [id number] FLUCONAZOLE 150 MG TABS (FLUCONAZOLE) as needed  #2 x 5   Entered and Authorized by:   [name] MD   Signed by:   [name] MD on 12/23/2009   Method used:   Electronically to        [facility] [phone]* (retail)       [address]       [place], [place]  [address]       Ph: [phone] or [phone]       Fax: [phone]   RxID:   [id number] LEVAQUIN 500 MG TABS (LEVOFLOXACIN) once daily  #10 x 0   Entered and Authorized by:   [name] MD   Signed by:   [name] MD on 12/23/2009   Method used:   Electronically to        [facility] [phone]* (retail)       [address]       [place], [place]  [address]       Ph: [phone] or [phone]       Fax: [phone]   RxID:   [id number]

## 2010-07-01 NOTE — Letter (Signed)
Summary: [facility]  [facility]   Imported By: Maryln Gottron 09/18/2008 13:44:07  _____________________________________________________________________  External Attachment:    Type:   Image     Comment:   External Document

## 2010-07-01 NOTE — Progress Notes (Signed)
Summary: refill xanax  Phone Note From Pharmacy   [name] Call For: [name]  Summary of Call: refill xanax 0.5mg  1 by mouth three times a day as needed for anxiety Initial call taken by: [name], CMA,  August 14, 2008 12:36 PM  Follow-up for Phone Call        we already did this on 08-10-08 Follow-up by: [name] MD,  August 14, 2008 12:51 PM

## 2010-07-01 NOTE — Assessment & Plan Note (Signed)
Summary: fu on meds/njr  Medications Added PROTONIX 40 MG TBEC (PANTOPRAZOLE SODIUM) Take 1 tablet by mouth once a day as needed CELEXA 20 MG TABS (CITALOPRAM HYDROBROMIDE) 1 by mouth once daily ZYRTEC ALLERGY 10 MG  TABS (CETIRIZINE HCL) as needed CELEXA 40 MG  TABS (CITALOPRAM HYDROBROMIDE) once daily ALPRAZOLAM 0.5 MG  TABS (ALPRAZOLAM) three times a day as needed anxiety VICODIN 5-500 MG  TABS (HYDROCODONE-ACETAMINOPHEN) three times a day as needed pain      Allergies Added: NKDA  Vital Signs:  Patient Profile:   35 Years Old Female Weight:      249 pounds (113.18 kg) Temp:     98.8 degrees F (37.11 degrees C) oral Pulse rate:   72 / minute Pulse rhythm:   regular BP sitting:   130 / 82  (left arm)  Vitals Entered By: [name], CMA (January 04, 2007 1:36 PM)               Chief Complaint:  Depression.  History of Present Illness: Has been on Celexa since late April. Had sinus surgery on 12-14-06 in [place]. Seems to have gone well.  Celexa worked well at first but now seems to not be as effective. Feels depressed and anxious, overwhelmed, hard to sleep.  Current Allergies: No known allergies   Past Medical History:    Allergic rhinitis    Headache    GERD    polycystic ovaries syndrome  Past Surgical History:    Appendectomy    Hysterectomy    Oophorectomy    Tonsillectomy     Review of Systems      See HPI   Physical Exam  General:     Well-developed,well-nourished,in no acute distress; alert,appropriate and cooperative throughout examination Psych:     depressed affect.      Impression & Recommendations:  Problem # 1:  ANXIETY DISORDER, GENERALIZED (ICD-300.02)  The following medications were removed from the medication list:    Celexa 20 Mg Tabs (Citalopram hydrobromide) .Marland Kitchen... 1 by mouth once daily  Her updated medication list for this problem includes:    Celexa 40 Mg Tabs (Citalopram hydrobromide) ..... Once daily  Alprazolam 0.5 Mg Tabs (Alprazolam) .Marland Kitchen... Three times a day as needed anxiety   Problem # 2:  HEADACHE (ICD-784.0)  Her updated medication list for this problem includes:    Vicodin 5-500 Mg Tabs (Hydrocodone-acetaminophen) .Marland Kitchen... Three times a day as needed pain   Complete Medication List: 1)  Protonix 40 Mg Tbec (Pantoprazole sodium) .... Take 1 tablet by mouth once a day as needed 2)  Zyrtec Allergy 10 Mg Tabs (Cetirizine hcl) .... As needed 3)  Celexa 40 Mg Tabs (Citalopram hydrobromide) .... Once daily 4)  Alprazolam 0.5 Mg Tabs (Alprazolam) .... Three times a day as needed anxiety 5)  Vicodin 5-500 Mg Tabs (Hydrocodone-acetaminophen) .... Three times a day as needed pain   Patient Instructions: 1)  Please schedule a follow-up appointment as needed.    Prescriptions: VICODIN 5-500 MG  TABS (HYDROCODONE-ACETAMINOPHEN) three times a day as needed pain  #60 x 0   Entered and Authorized by:   [name] MD   Signed by:   [name] MD on 01/04/2007   Method used:   Print then Give to Patient   RxID:   [id number] ALPRAZOLAM 0.5 MG  TABS (ALPRAZOLAM) three times a day as needed anxiety  #60 x 2   Entered and Authorized by:   [name]  MD   Signed by:   [name] MD on 01/04/2007   Method used:   Print then Give to Patient   RxID:   [id number] CELEXA 40 MG  TABS (CITALOPRAM HYDROBROMIDE) once daily  #30 x 11   Entered and Authorized by:   [name] MD   Signed by:   [name] MD on 01/04/2007   Method used:   [facility] ...       [facility] [address]       [address]       [place], [place]  [address]       Ph: [phone] or [phone]       Fax: [phone]   RxID:   [id number]      [facility] Q&E-CCC]

## 2010-07-01 NOTE — Letter (Signed)
Summary: [facility]  [facility]   Imported By: Maryln Gottron 11/25/2009 14:01:06  _____________________________________________________________________  External Attachment:    Type:   Image     Comment:   External Document

## 2010-07-01 NOTE — Progress Notes (Signed)
Summary: refill hydrocodone  Phone Note From Pharmacy   [name] Call For: [name]  Summary of Call: refill hydrocodone 5/500 1 by mouth three times a day as needed for pain Initial call taken by: [name], CMA,  August 09, 2009 2:14 PM  Follow-up for Phone Call        call in #60 with 3 rf Follow-up by: [name] MD,  August 09, 2009 3:05 PM  Additional Follow-up for Phone Call Additional follow up Details #1::        Phone call completed, Pharmacist called Additional Follow-up by: [name], CMA,  August 09, 2009 3:12 PM    Prescriptions: VICODIN 5-500 MG  TABS (HYDROCODONE-ACETAMINOPHEN) three times a day as needed pain  #60 x 3   Entered by:   [name], CMA   Authorized by:   [name] MD   Signed by:   [name], CMA on 08/09/2009   Method used:   Telephoned to ...       [facility] [address] (retail)       [address]       [place], [place]  [address]       Ph: [phone] or [phone]       Fax: [phone]   RxID:   [id number]

## 2010-07-01 NOTE — Progress Notes (Signed)
Summary: date of tetanus  Phone Note Call from Patient   Caller: Patient Call For: Dr. Clent Ridges Summary of Call: Pt stepped on a hanger, and called Urgent Care at [facility] to see if she got a Tetanus last year when she had a foot injury.  They would not give her the info.   Suggested she call back and speak to charge nurse, and if she still cannot get this information.......call us back. Recent injury was yesterday. Initial call taken by: Lynann Beaver CMA,  April 07, 2007 10:14 AM  Follow-up for Phone Call        cALL FROM PATIENT:  No Tetanus at [facility] and that she knows of none in her adult life otherwise.  Out of country tomorrow.  Tetanus inj ASAP today with Dr. Lenard Lance. Follow-up by: Rudy Jew, RN,  April 07, 2007 11:34 AM  Additional Follow-up for Phone Call Additional follow up Details #1::        Phone Call Completed, Appt Scheduled Today Additional Follow-up by: [name], CMA,  April 07, 2007 12:29 PM

## 2010-07-01 NOTE — Letter (Signed)
Summary: [facility]  [facility]   Imported By: Maryln Gottron 02/27/2010 13:25:23  _____________________________________________________________________  External Attachment:    Type:   Image     Comment:   External Document

## 2010-07-01 NOTE — Assessment & Plan Note (Signed)
Summary: sinus inf/cough/headache/cjr   Vital Signs:  Patient profile:   35 year old female Weight:      273 pounds BMI:     39.31 Temp:     97.9 degrees F oral BP sitting:   130 / 110  (left arm) Cuff size:   large  Vitals Entered By: [name], CMA (June 21, 2009 4:05 PM) CC: sinus inf?   History of Present Illness: Here for one week of chest pressure, burning, and a dry ocugh. No fever.   Allergies: No Known Drug Allergies  Past History:  Past Medical History: Reviewed history from 04/08/2009 and no changes required. Allergic rhinitis Headache GERD polycystic ovaries syndrome B12 deficiency peripheral neuropathy sees Dr. Henderson Cloud for GYN exams fibromyalgia Depression  Review of Systems  The patient denies anorexia, fever, weight loss, weight gain, vision loss, decreased hearing, hoarseness, chest pain, syncope, dyspnea on exertion, peripheral edema, headaches, hemoptysis, abdominal pain, melena, hematochezia, severe indigestion/heartburn, hematuria, incontinence, genital sores, muscle weakness, suspicious skin lesions, transient blindness, difficulty walking, depression, unusual weight change, abnormal bleeding, enlarged lymph nodes, angioedema, breast masses, and testicular masses.    Physical Exam  General:  Well-developed,well-nourished,in no acute distress; alert,appropriate and cooperative throughout examination Head:  Normocephalic and atraumatic without obvious abnormalities. No apparent alopecia or balding. Eyes:  No corneal or conjunctival inflammation noted. EOMI. Perrla. Funduscopic exam benign, without hemorrhages, exudates or papilledema. Vision grossly normal. Ears:  External ear exam shows no significant lesions or deformities.  Otoscopic examination reveals clear canals, tympanic membranes are intact bilaterally without bulging, retraction, inflammation or discharge. Hearing is grossly normal bilaterally. Nose:  External nasal examination shows no  deformity or inflammation. Nasal mucosa are pink and moist without lesions or exudates. Mouth:  Oral mucosa and oropharynx without lesions or exudates.  Teeth in good repair. Neck:  No deformities, masses, or tenderness noted. Lungs:  scattered rhonchi   Impression & Recommendations:  Problem # 1:  ACUTE BRONCHITIS (ICD-466.0)  Her updated medication list for this problem includes:    Hydromet 5-1.5 Mg/27ml Syrp (Hydrocodone-homatropine) .Marland Kitchen... 1 tsp q 4 hours as needed cough    Zithromax Z-pak 250 Mg Tabs (Azithromycin) .Marland Kitchen... As directed  Complete Medication List: 1)  Alprazolam 0.5 Mg Tabs (Alprazolam) .... Three times a day as needed anxiety 2)  Vicodin 5-500 Mg Tabs (Hydrocodone-acetaminophen) .... Three times a day as needed pain 3)  Bd Integra Syringe 25g X 1" 3 Ml Misc (Syringe/needle (disp)) .... As directed 4)  Promethazine Hcl 25 Mg Tabs (Promethazine hcl) .... One every 4 hours as needed nausea 5)  Benzaclin 1-5 % Gel (Clindamycin phos-benzoyl perox) .... Apply two times a day 6)  Phentermine Hcl 37.5 Mg Caps (Phentermine hcl) .... Once daily 7)  Hydrochlorothiazide 12.5 Mg Tabs (Hydrochlorothiazide) .... Take 1 tab  by mouth every morning 8)  Flexeril 10 Mg Tabs (Cyclobenzaprine hcl) .... Three times a day as needed spasm 9)  Pantoprazole Sodium 40 Mg Tbec (Pantoprazole sodium) .Marland Kitchen.. 1 by mouth once daily 10)  Ambien Cr 12.5 Mg Cr-tabs (Zolpidem tartrate) .... At bedtime 11)  Celexa 40 Mg Tabs (Citalopram hydrobromide) .... Once daily 12)  Diflucan 150 Mg Tabs (Fluconazole) .... Once daily 13)  Hydromet 5-1.5 Mg/43ml Syrp (Hydrocodone-homatropine) .Marland Kitchen.. 1 tsp q 4 hours as needed cough 14)  Zithromax Z-pak 250 Mg Tabs (Azithromycin) .... As directed  Patient Instructions: 1)  Please schedule a follow-up appointment as needed .  Prescriptions: ZITHROMAX Z-PAK 250 MG TABS (AZITHROMYCIN) as directed  #  1 x 0   Entered and Authorized by:   [name] MD   Signed by:   [name] MD on 06/21/2009   Method used:   Print then Give to Patient   RxID:   [id number] HYDROMET 5-1.5 MG/5ML SYRP (HYDROCODONE-HOMATROPINE) 1 tsp q 4 hours as needed cough  #240 x 0   Entered and Authorized by:   [name] MD   Signed by:   [name] MD on 06/21/2009   Method used:   Print then Give to Patient   RxID:   [id number]

## 2010-07-01 NOTE — Progress Notes (Signed)
Summary: rx quanity  Phone Note From Pharmacy   [name][facility] Summary of Call: need quanity on rx for fluconazole Initial call taken by: Duard Brady LPN,  November 11, 2009 11:36 AM    Prescriptions: FLUCONAZOLE 150 MG TABS (FLUCONAZOLE) one single dose  #1 x 0   Entered by:   Duard Brady LPN   Authorized by:   Gordy Savers  MD   Signed by:   Duard Brady LPN on 96/08/5407   Method used:   Faxed to ...       [facility] [address]* (retail)       [address]       [place], [place]  [address]       Ph: [phone] or [phone]       Fax: [phone]   RxID:   [id number]

## 2010-07-01 NOTE — Progress Notes (Signed)
  Phone Note Call from Patient Call back at Home Phone [phone]   Caller: Patient-Voicemail Summary of Call: Pt calling in regards to B-12 injections that Dr. Clent Ridges ordered her to get for 12 wks.  States she called her insurance and they will not cover B12. She states she can not afford to pay out of pocket for this injection every week for 12 weeks.  Wants to know if there is an alternative treatment plan, such as pills she can take instead of getting injections weekly. Initial call taken by: Trixie Dredge,  March 01, 2008 12:24 PM  Follow-up for Phone Call        call her and tell her I Emailed a rx for a b12 pill to take daily Follow-up by: [name] MD,  March 01, 2008 12:44 PM  Additional Follow-up for Phone Call Additional follow up Details #1::        Phone Call Completed Additional Follow-up by: [name], CMA,  March 02, 2008 11:03 AM    New/Updated Medications: VITAMIN B-12 2000 MCG CR-TABS (CYANOCOBALAMIN) once daily   Prescriptions: VITAMIN B-12 2000 MCG CR-TABS (CYANOCOBALAMIN) once daily  #30 x 11   Entered and Authorized by:   [name] MD   Signed by:   [name] MD on 03/01/2008   Method used:   Electronically to        [facility] [phone]* (retail)       [address]       [place], [place]  [address]       Ph: [phone] or [phone]       Fax: [phone]   RxID:   [id number]

## 2010-07-01 NOTE — Assessment & Plan Note (Signed)
Summary: stomach swollen and hurts/mhf   Vital Signs:  Patient Profile:   35 Years Old Female Weight:      257 pounds Temp:     98.1 degrees F oral BP sitting:   140 / 80  (left arm) Cuff size:   large  Vitals Entered By: [name], CMA (August 22, 2007 4:14 PM)                 Chief Complaint:  stomach swollen.  History of Present Illness: 2 weeks ago developed pain in lower back and down right  leg. No trauma recently. No urinary sx. Saw Dr. Farris Has who thought she she had a disc problem in her spine. Put her on Prednisone and Oxycodone. Back feels better, but she stopped having BM's and her abdomen began to swell up. Now is off the meds , but is still swollen and her abdomen hurts. Has been passing liquid stools for the past 2 days. Is nauseated but has not vomited. No fever.     Current Allergies (reviewed today): No known allergies   Past Medical History:    Reviewed history from 01/04/2007 and no changes required:       Allergic rhinitis       Headache       GERD       polycystic ovaries syndrome     Review of Systems      See HPI   Physical Exam  General:     overweight-appearing.   Abdomen:     soft, normal bowel sounds, no distention, no masses, no guarding, no rigidity, no rebound tenderness, no abdominal hernia, no hepatomegaly, and no splenomegaly.  Diffuse mild tenderness.    Impression & Recommendations:  Problem # 1:  CONSTIPATION (ICD-564.00)  Complete Medication List: 1)  Zyrtec Allergy 10 Mg Tabs (Cetirizine hcl) .... As needed 2)  Alprazolam 0.5 Mg Tabs (Alprazolam) .... Three times a day as needed anxiety 3)  Vicodin 5-500 Mg Tabs (Hydrocodone-acetaminophen) .... Three times a day as needed pain 4)  Fluconazole 150 Mg Tabs (Fluconazole) .Marland Kitchen.. 1 as needed 5)  Celexa 10 Mg Tabs (Citalopram hydrobromide) .... Once daily   Patient Instructions: 1)  I agreed with her that she is probably constipated from th epain meds she was taking.  Stop them all now except Flexeril if she can. Drink plenty of water. Try magnesium citrate, take 2 bottles this evening. Use one Fleet enema. Begin Miralax one capfull in water two times a day . Call me back tomorrow.    ]

## 2010-07-01 NOTE — Progress Notes (Signed)
Summary: will the B 12 pills be as effective as the shots   Phone Note Call from Patient Call back at Home Phone [phone]   Caller: pt live Call For: Clent Ridges  Summary of Call: She could not afford the B12 injections.  Will the pills do the same thing?  If not maybe she could afford to give herself the shots.   Initial call taken by: [name],  March 02, 2008 2:07 PM  Follow-up for Phone Call        Pt will try the oral meds first. Follow-up by: Lynann Beaver CMA,  March 02, 2008 2:16 PM  Additional Follow-up for Phone Call Additional follow up Details #1::        noted Additional Follow-up by: [name] MD,  March 02, 2008 2:36 PM

## 2010-07-01 NOTE — Progress Notes (Signed)
Summary: refill hydrocodone  Phone Note From Pharmacy   [name] Call For: fry  Summary of Call: refill hydrocodone 5/500 1 by mouth three times a day as needed  Initial call taken by: [name], CMA,  September 21, 2007 10:28 AM  Follow-up for Phone Call        call in #60 with 2 rf Follow-up by: [name] MD,  September 21, 2007 2:58 PM  Additional Follow-up for Phone Call Additional follow up Details #1::        Phone call completed, Pharmacist called Additional Follow-up by: [name], CMA,  September 21, 2007 3:31 PM      Prescriptions: VICODIN 5-500 MG  TABS (HYDROCODONE-ACETAMINOPHEN) three times a day as needed pain  #60 x 2   Entered by:   [name], CMA   Authorized by:   [name] MD   Signed by:   [name], CMA on 09/21/2007   Method used:   Telephoned to ...       [facility]  [address] [id number]       [address]       [place], [place]  [address]       Ph: [phone] or [phone]       Fax: [phone]   RxID:   [id number]

## 2010-07-01 NOTE — Progress Notes (Signed)
Summary: Celexa?  Phone Note Call from Patient   Caller: Patient Call For: Hannah Salisbury MD Summary of Call: Pt would like to start back on Celexa.  Husband lost his job, and she is crying all the time, and very sad.  Would like  samples if possible. [facility]) [phone] Initial call taken by: Lynann Beaver CMA,  February 11, 2009 1:26 PM  Follow-up for Phone Call        call in Celexa 20 mg once daily , #30 with 5 rf Follow-up by: Hannah Salisbury MD,  February 12, 2009 10:43 AM  Additional Follow-up for Phone Call Additional follow up Details #1::        Phone Call Completed, Rx Called In Additional Follow-up by: [name], CMA,  February 12, 2009 11:38 AM    New/Updated Medications: CELEXA 20 MG TABS (CITALOPRAM HYDROBROMIDE) 1 by mouth once daily Prescriptions: CELEXA 20 MG TABS (CITALOPRAM HYDROBROMIDE) 1 by mouth once daily  #30 x 5   Entered by:   [name], CMA   Authorized by:   Hannah Salisbury MD   Signed by:   [name], CMA on 02/12/2009   Method used:   Electronically to        [facility] [phone]* (retail)       [address]       [place], [place]  [address]       Ph: [phone] or [phone]       Fax: [phone]   RxID:   [id number]

## 2010-07-01 NOTE — Progress Notes (Signed)
Summary: refill alprazolam  Phone Note From Pharmacy   Caller[name] Call For: fry  Summary of Call: refill alprazolam 0.5mg  1 by mouth three times a day as needed for anxiety Initial call taken by: [name], CMA,  January 23, 2008 11:58 AM  Follow-up for Phone Call        call in #90 with 5 rf Follow-up by: [name] MD,  January 23, 2008 3:00 PM  Additional Follow-up for Phone Call Additional follow up Details #1::        Phone call completed, Pharmacist called Additional Follow-up by: [name], CMA,  January 23, 2008 4:09 PM      Prescriptions: ALPRAZOLAM 0.5 MG  TABS (ALPRAZOLAM) three times a day as needed anxiety  #90 x 5   Entered by:   [name], CMA   Authorized by:   [name] MD   Signed by:   [name], CMA on 01/23/2008   Method used:   Telephoned to ...       [facility] [phone]* (retail)       [address]       [place], [place]  [address]       Ph: [phone] or [phone]       Fax: [phone]   RxID:   [id number]

## 2010-07-01 NOTE — Assessment & Plan Note (Signed)
Summary: HA/ST/NJR   Vital Signs:  Patient Profile:   35 Years Old Female Weight:      249 pounds (113.18 kg) Temp:     98.5 degrees F (36.94 degrees C) oral Pulse rate:   68 / minute Pulse rhythm:   regular BP sitting:   134 / 82  (left arm) Cuff size:   large  Vitals Entered By: [name], CMA (February 07, 2007 2:50 PM)                 Chief Complaint:  st x 1 wk and h/a.  History of Present Illness: For past 2 weeks has had daily sinus congestion, HA, PND, ans ST. Some fevers. No cough. On Mucinex.  Current Allergies: No known allergies   Past Medical History:    Reviewed history from 01/04/2007 and no changes required:       Allergic rhinitis       Headache       GERD       polycystic ovaries syndrome  Past Surgical History:    Reviewed history from 01/04/2007 and no changes required:       Appendectomy       Hysterectomy       Oophorectomy       Tonsillectomy       Sinus surgery 12-14-06      Physical Exam  General:     Well-developed,well-nourished,in no acute distress; alert,appropriate and cooperative throughout examination Head:     Normocephalic and atraumatic without obvious abnormalities. No apparent alopecia or balding. Eyes:     No corneal or conjunctival inflammation noted. EOMI. Perrla. Funduscopic exam benign, without hemorrhages, exudates or papilledema. Vision grossly normal. Ears:     External ear exam shows no significant lesions or deformities.  Otoscopic examination reveals clear canals, tympanic membranes are intact bilaterally without bulging, retraction, inflammation or discharge. Hearing is grossly normal bilaterally. Nose:     External nasal examination shows no deformity or inflammation. Nasal mucosa are pink and moist without lesions or exudates. Mouth:     Oral mucosa and oropharynx without lesions or exudates.  Teeth in good repair. Neck:     No deformities, masses, or tenderness noted.    Impression &  Recommendations:  Problem # 1:  SINUSITIS, ACUTE FRONTAL (ICD-461.1)  Her updated medication list for this problem includes:    Augmentin 875-125 Mg Tabs (Amoxicillin-pot clavulanate) .Marland Kitchen..Marland Kitchen Two times a day   Complete Medication List: 1)  Zyrtec Allergy 10 Mg Tabs (Cetirizine hcl) .... As needed 2)  Celexa 40 Mg Tabs (Citalopram hydrobromide) .... Once daily 3)  Alprazolam 0.5 Mg Tabs (Alprazolam) .... Three times a day as needed anxiety 4)  Vicodin 5-500 Mg Tabs (Hydrocodone-acetaminophen) .... Three times a day as needed pain 5)  Augmentin 875-125 Mg Tabs (Amoxicillin-pot clavulanate) .... Two times a day 6)  Fluconazole 150 Mg Tabs (Fluconazole) .Marland Kitchen.. 1 as needed   Patient Instructions: 1)  Please schedule a follow-up appointment as needed.    Prescriptions: FLUCONAZOLE 150 MG  TABS (FLUCONAZOLE) 1 as needed  #1 x 5   Entered and Authorized by:   [name] MD   Signed by:   [name] MD on 02/07/2007   Method used:   Electronically sent to ...       [id number]       [address]       [place], [place]  [address]  Ph: [phone] or [phone]       Fax: [phone]   RxID:   [id number] VICODIN 5-500 MG  TABS (HYDROCODONE-ACETAMINOPHEN) three times a day as needed pain  #60 x 2   Entered and Authorized by:   [name] MD   Signed by:   [name] MD on 02/07/2007   Method used:   Print then Give to Patient   RxID:   [id number] AUGMENTIN 875-125 MG  TABS (AMOXICILLIN-POT CLAVULANATE) two times a day  #20 x 0   Entered and Authorized by:   [name] MD   Signed by:   [name] MD on 02/07/2007   Method used:   Electronically sent to ...       [facility] [address]       [address]       [place], [place]  [address]       Ph: [phone] or [phone]       Fax: [phone]   RxID:   [id number]

## 2010-07-01 NOTE — Progress Notes (Signed)
Summary: refill phentermine  Phone Note From Pharmacy   [name] Call For: Currie Dennin  Reason for Call: Needs renewal Summary of Call: refill phentermine 37.5mg  1 by mouth once daily Initial call taken by: [name], CMA,  January 23, 2009 4:06 PM  Follow-up for Phone Call        call in #30 with 5 rf Follow-up by: [name] MD,  January 23, 2009 4:59 PM  Additional Follow-up for Phone Call Additional follow up Details #1::        Phone call completed, Pharmacist called Additional Follow-up by: [name], CMA,  January 23, 2009 5:00 PM    Prescriptions: PHENTERMINE HCL 37.5 MG CAPS (PHENTERMINE HCL) once daily  #30 x 5   Entered by:   [name], CMA   Authorized by:   [name] MD   Signed by:   [name], CMA on 01/23/2009   Method used:   Telephoned to ...       [facility] [address] (retail)       [address]       [place], [place]  [address]       Ph: [phone] or [phone]       Fax: [phone]   RxID:   [id number]

## 2010-07-01 NOTE — Progress Notes (Signed)
Summary: wellbutrin  Phone Note Call from Patient Call back at Home Phone [phone]   Summary of Call: On Wellbutrin feel very angry & sad.  Don't think it's working.  Should I go back to Celexa.  Very loud interference call like driving.   [facility]  Initial call taken by: Rudy Jew, RN,  Oct 25, 2009 3:11 PM  Follow-up for Phone Call        actually I would have her start back on Celexa but take Wellbutrin along with it. The combination might be good for her. Follow up in 3 weeks Follow-up by: [name] MD,  Oct 25, 2009 3:20 PM  Additional Follow-up for Phone Call Additional follow up Details #1::        [facility] Additional Follow-up by: Raechel Ache, RN,  Oct 25, 2009 3:25 PM

## 2010-07-01 NOTE — Assessment & Plan Note (Signed)
Summary: fu on meds/njr   Vital Signs:  Patient profile:   35 year old female Weight:      270 pounds Temp:     98.1 degrees F oral Pulse rate:   107 / minute BP sitting:   124 / 84  (left arm) Cuff size:   large  Vitals Entered By: [name], CMA (April 08, 2009 11:12 AM) CC: renew meds   History of Present Illness: Here for refills. She would like to try controlled release Ambien to see if it last longer. She currently sleeps only about 4-5 hours a night. Also she has beem under a lot of stress. Her husband lost his job, and things are tough around the house.  Current Medications (verified): 1)  Alprazolam 0.5 Mg  Tabs (Alprazolam) .... Three Times A Day As Needed Anxiety 2)  Vicodin 5-500 Mg  Tabs (Hydrocodone-Acetaminophen) .... Three Times A Day As Needed Pain 3)  Bd Integra Syringe 25g X 1" 3 Ml Misc (Syringe/needle (Disp)) .... As Directed 4)  Ambien 10 Mg Tabs (Zolpidem Tartrate) .... At Bedtime 5)  Promethazine Hcl 25 Mg Tabs (Promethazine Hcl) .... One Every 4 Hours As Needed Nausea 6)  Benzaclin 1-5 % Gel (Clindamycin Phos-Benzoyl Perox) .... Apply Two Times A Day 7)  Phentermine Hcl 37.5 Mg Caps (Phentermine Hcl) .... Once Daily 8)  Hydrochlorothiazide 12.5 Mg  Tabs (Hydrochlorothiazide) .... Take 1 Tab  By Mouth Every Morning 9)  Flexeril 10 Mg Tabs (Cyclobenzaprine Hcl) .... Three Times A Day As Needed Spasm 10)  Pantoprazole Sodium 40 Mg Tbec (Pantoprazole Sodium) .Marland Kitchen.. 1 By Mouth Once Daily 11)  Celexa 20 Mg Tabs (Citalopram Hydrobromide) .Marland Kitchen.. 1 By Mouth Once Daily  Allergies (verified): No Known Drug Allergies  Past History:  Past Medical History: Allergic rhinitis Headache GERD polycystic ovaries syndrome B12 deficiency peripheral neuropathy sees Dr. Henderson Cloud for GYN exams fibromyalgia Depression  Review of Systems  The patient denies anorexia, fever, weight loss, weight gain, vision loss, decreased hearing, hoarseness, chest pain, syncope,  dyspnea on exertion, peripheral edema, prolonged cough, headaches, hemoptysis, abdominal pain, melena, hematochezia, severe indigestion/heartburn, hematuria, incontinence, genital sores, muscle weakness, suspicious skin lesions, transient blindness, difficulty walking, depression, unusual weight change, abnormal bleeding, enlarged lymph nodes, angioedema, breast masses, and testicular masses.    Physical Exam  General:  Well-developed,well-nourished,in no acute distress; alert,appropriate and cooperative throughout examination Psych:  Cognition and judgment appear intact. Alert and cooperative with normal attention span and concentration. No apparent delusions, illusions, hallucinations   Impression & Recommendations:  Problem # 1:  DEPRESSION (ICD-311) Assessment Deteriorated  The following medications were removed from the medication list:    Celexa 20 Mg Tabs (Citalopram hydrobromide) .Marland Kitchen... 1 by mouth once daily Her updated medication list for this problem includes:    Alprazolam 0.5 Mg Tabs (Alprazolam) .Marland Kitchen... Three times a day as needed anxiety    Celexa 40 Mg Tabs (Citalopram hydrobromide) ..... Once daily  Problem # 2:  FIBROMYALGIA (ICD-729.1) Assessment: Unchanged  Her updated medication list for this problem includes:    Vicodin 5-500 Mg Tabs (Hydrocodone-acetaminophen) .Marland Kitchen... Three times a day as needed pain    Flexeril 10 Mg Tabs (Cyclobenzaprine hcl) .Marland Kitchen... Three times a day as needed spasm  Problem # 3:  ANXIETY DISORDER, GENERALIZED (ICD-300.02) Assessment: Deteriorated  The following medications were removed from the medication list:    Celexa 20 Mg Tabs (Citalopram hydrobromide) .Marland Kitchen... 1 by mouth once daily Her updated medication list for this problem  includes:    Alprazolam 0.5 Mg Tabs (Alprazolam) .Marland Kitchen... Three times a day as needed anxiety    Celexa 40 Mg Tabs (Citalopram hydrobromide) ..... Once daily  Problem # 4:  INSOMNIA (ICD-780.52) Assessment:  Deteriorated  Complete Medication List: 1)  Alprazolam 0.5 Mg Tabs (Alprazolam) .... Three times a day as needed anxiety 2)  Vicodin 5-500 Mg Tabs (Hydrocodone-acetaminophen) .... Three times a day as needed pain 3)  Bd Integra Syringe 25g X 1" 3 Ml Misc (Syringe/needle (disp)) .... As directed 4)  Promethazine Hcl 25 Mg Tabs (Promethazine hcl) .... One every 4 hours as needed nausea 5)  Benzaclin 1-5 % Gel (Clindamycin phos-benzoyl perox) .... Apply two times a day 6)  Phentermine Hcl 37.5 Mg Caps (Phentermine hcl) .... Once daily 7)  Hydrochlorothiazide 12.5 Mg Tabs (Hydrochlorothiazide) .... Take 1 tab  by mouth every morning 8)  Flexeril 10 Mg Tabs (Cyclobenzaprine hcl) .... Three times a day as needed spasm 9)  Pantoprazole Sodium 40 Mg Tbec (Pantoprazole sodium) .Marland Kitchen.. 1 by mouth once daily 10)  Ambien Cr 12.5 Mg Cr-tabs (Zolpidem tartrate) .... At bedtime 11)  Celexa 40 Mg Tabs (Citalopram hydrobromide) .... Once daily  Patient Instructions: 1)  Change meds as above.  Prescriptions: PANTOPRAZOLE SODIUM 40 MG TBEC (PANTOPRAZOLE SODIUM) 1 by mouth once daily  #90 x 11   Entered and Authorized by:   [name] MD   Signed by:   [name] MD on 04/08/2009   Method used:   Print then Give to Patient   RxID:   [id number] HYDROCHLOROTHIAZIDE 12.5 MG  TABS (HYDROCHLOROTHIAZIDE) Take 1 tab  by mouth every morning  #903 x 0   Entered and Authorized by:   [name] MD   Signed by:   [name] MD on 04/08/2009   Method used:   Print then Give to Patient   RxID:   [id number] FLEXERIL 10 MG TABS (CYCLOBENZAPRINE HCL) three times a day as needed spasm  #270 x 1   Entered and Authorized by:   [name] MD   Signed by:   [name] MD on 04/08/2009   Method used:   Print then Give to Patient   RxID:   [id number] CELEXA 40 MG TABS (CITALOPRAM HYDROBROMIDE) once daily  #30 x 22   Entered and Authorized by:   [name] MD   Signed by:   [name] MD  on 04/08/2009   Method used:   Print then Give to Patient   RxID:   [id number] AMBIEN CR 12.5 MG CR-TABS (ZOLPIDEM TARTRATE) at bedtime  #90 x 1   Entered and Authorized by:   [name] MD   Signed by:   [name] MD on 04/08/2009   Method used:   Print then Give to Patient   RxID:   [id number]

## 2010-07-01 NOTE — Assessment & Plan Note (Signed)
Summary: discuss B1 injections and teach/dm   Vital Signs:  Patient Profile:   35 Years Old Female Weight:      257 pounds Temp:     97.9 degrees F oral Pulse rate:   109 / minute BP sitting:   102 / 80  (left arm) Cuff size:   large  Vitals Entered By: [name], CMA (March 23, 2008 10:59 AM)                 Chief Complaint:  discuss b12 shot.  History of Present Illness: Here to get a B12 shot and to teach her and her husband how to do this at home. Still has burning in the feet.    Current Allergies: No known allergies   Past Medical History:    Reviewed history from 01/04/2007 and no changes required:       Allergic rhinitis       Headache       GERD       polycystic ovaries syndrome       B12 deficiency       peripheral neuropathy     Review of Systems  The patient denies anorexia, fever, weight loss, weight gain, vision loss, decreased hearing, hoarseness, chest pain, syncope, dyspnea on exertion, peripheral edema, prolonged cough, headaches, hemoptysis, abdominal pain, melena, hematochezia, severe indigestion/heartburn, hematuria, incontinence, genital sores, muscle weakness, suspicious skin lesions, transient blindness, difficulty walking, depression, unusual weight change, abnormal bleeding, enlarged lymph nodes, angioedema, breast masses, and testicular masses.     Physical Exam  General:     overweight-appearing.      Impression & Recommendations:  Problem # 1:  LEG EDEMA (ICD-782.3)  Her updated medication list for this problem includes:    Hydrochlorothiazide 12.5 Mg Tabs (Hydrochlorothiazide) ..... Once daily   Problem # 2:  PERIPHERAL NEUROPATHY (ICD-356.9)  Complete Medication List: 1)  Zyrtec Allergy 10 Mg Tabs (Cetirizine hcl) .... As needed 2)  Alprazolam 0.5 Mg Tabs (Alprazolam) .... Three times a day as needed anxiety 3)  Vicodin 5-500 Mg Tabs (Hydrocodone-acetaminophen) .... Three times a day as needed pain 4)   Hydrochlorothiazide 12.5 Mg Tabs (Hydrochlorothiazide) .... Once daily 5)  Cyanocobalamin 1000 Mcg/ml Soln (Cyanocobalamin) .... One ml per week 6)  Bd Integra Syringe 25g X 1" 3 Ml Misc (Syringe/needle (disp)) .... As directed  Other Orders: Vit B12 1000 mcg (J3420) Admin of Therapeutic Inj  intramuscular or subcutaneous (16109)   Patient Instructions: 1)  we will check another B12 level in 12 weeks   Prescriptions: BD INTEGRA SYRINGE 25G X 1" 3 ML MISC (SYRINGE/NEEDLE (DISP)) as directed  #12 x 5   Entered and Authorized by:   [name] MD   Signed by:   [name] MD on 03/23/2008   Method used:   Print then Give to Patient   RxID:   [id number] CYANOCOBALAMIN 1000 MCG/ML SOLN (CYANOCOBALAMIN) one ml per week  #10 ml x 5   Entered and Authorized by:   [name] MD   Signed by:   [name] MD on 03/23/2008   Method used:   Print then Give to Patient   RxID:   [id number]  ]  Medication Administration  Injection # 1:    Medication: Vit B12 1000 mcg    Diagnosis: VITAMIN B12 DEFICIENCY (ICD-266.2)    Route: IM    Site: L deltoid    Exp Date: 4/11    Lot #: [id number]  Mfr: [employer]    Patient tolerated injection without complications    Given by: [name], CMA (March 23, 2008 11:50 AM)  Orders Added: 1)  Est. Patient Level III [57846] 2)  Vit B12 1000 mcg [J3420] 3)  Admin of Therapeutic Inj  intramuscular or subcutaneous [96295]

## 2010-07-01 NOTE — Assessment & Plan Note (Signed)
Summary: chest hurting,head hurt,earpain/jls   Vital Signs:  Patient Profile:   35 Years Old Female Weight:      260 pounds Temp:     98.6 degrees F oral BP sitting:   118 / 82  Vitals Entered By: Lynann Beaver CMA (March 09, 2008 3:12 PM)                 Chief Complaint:  headache, ear pain, and sore throat.  History of Present Illness: 3 days of stuffy head, sinus pressure, ST, and dry cough. No fever. On Sudafed PE.     Current Allergies (reviewed today): No known allergies   Past Medical History:    Reviewed history from 01/04/2007 and no changes required:       Allergic rhinitis       Headache       GERD       polycystic ovaries syndrome     Review of Systems  The patient denies anorexia, fever, weight loss, weight gain, vision loss, decreased hearing, hoarseness, chest pain, syncope, dyspnea on exertion, peripheral edema, hemoptysis, abdominal pain, melena, hematochezia, severe indigestion/heartburn, hematuria, incontinence, genital sores, muscle weakness, suspicious skin lesions, transient blindness, difficulty walking, depression, unusual weight change, abnormal bleeding, enlarged lymph nodes, angioedema, breast masses, and testicular masses.     Physical Exam  General:     Well-developed,well-nourished,in no acute distress; alert,appropriate and cooperative throughout examination Head:     Normocephalic and atraumatic without obvious abnormalities. No apparent alopecia or balding. Eyes:     No corneal or conjunctival inflammation noted. EOMI. Perrla. Funduscopic exam benign, without hemorrhages, exudates or papilledema. Vision grossly normal. Ears:     External ear exam shows no significant lesions or deformities.  Otoscopic examination reveals clear canals, tympanic membranes are intact bilaterally without bulging, retraction, inflammation or discharge. Hearing is grossly normal bilaterally. Nose:     External nasal examination shows no deformity or  inflammation. Nasal mucosa are pink and moist without lesions or exudates. Mouth:     Oral mucosa and oropharynx without lesions or exudates.  Teeth in good repair. Neck:     No deformities, masses, or tenderness noted. Lungs:     Normal respiratory effort, chest expands symmetrically. Lungs are clear to auscultation, no crackles or wheezes.    Impression & Recommendations:  Problem # 1:  SINUSITIS, ACUTE FRONTAL (ICD-461.1)  Her updated medication list for this problem includes:    Zithromax Z-pak 250 Mg Tabs (Azithromycin) .Marland Kitchen... As directed   Complete Medication List: 1)  Zyrtec Allergy 10 Mg Tabs (Cetirizine hcl) .... As needed 2)  Alprazolam 0.5 Mg Tabs (Alprazolam) .... Three times a day as needed anxiety 3)  Vicodin 5-500 Mg Tabs (Hydrocodone-acetaminophen) .... Three times a day as needed pain 4)  Hydrochlorothiazide 12.5 Mg Tabs (Hydrochlorothiazide) .... Once daily 5)  Vitamin B-12 2000 Mcg Cr-tabs (Cyanocobalamin) .... Once daily 6)  Zithromax Z-pak 250 Mg Tabs (Azithromycin) .... As directed   Patient Instructions: 1)  Please schedule a follow-up appointment as needed.   Prescriptions: ZITHROMAX Z-PAK 250 MG TABS (AZITHROMYCIN) as directed  #1 x 0   Entered and Authorized by:   [name] MD   Signed by:   [name] MD on 03/09/2008   Method used:   [facility]        [phone] ([facility]ail[facility]       [address]       [place], [place]  [address]  Ph: [phone] or [phone]       Fax: [phone]   RxID:   [id number]  ]

## 2010-07-01 NOTE — Assessment & Plan Note (Signed)
Summary: very tired,feet hurt/jls   Vital Signs:  Patient Profile:   35 Years Old Female Weight:      269 pounds Temp:     98.3 degrees F oral BP sitting:   116 / 88  (left arm) Cuff size:   large  Vitals Entered By: [name], CMA (June 20, 2008 10:09 AM)                 Chief Complaint:  pain in feet, fatigue, achy, check vit b12 level, and discuss h/a.  History of Present Illness: Here to get her B12 level checked but also to discuss other issues. She has trouble sleeping, often takes several hours befor eshe can get to sleep. Used Ambien in the past, and would like to try it now. also still complains about constant fatigue that does not improve. She has been  on B12 shots, but she feels no different than when we started. She also mentions diffuse muscle aches that come and go. Her mother has been diagnosed with fibromyalgia.     Current Allergies: No known allergies   Past Medical History:    Reviewed history from 03/23/2008 and no changes required:       Allergic rhinitis       Headache       GERD       polycystic ovaries syndrome       B12 deficiency       peripheral neuropathy       sees Dr. Henderson Cloud for GYN exams  Past Surgical History:    Reviewed history from 02/07/2007 and no changes required:       Appendectomy       Hysterectomy       Oophorectomy       Tonsillectomy       Sinus surgery 12-14-06   Family History:    Reviewed history and no changes required:       mother has fibromyalgia     Physical Exam  General:     overweight-appearing.   Neck:     No deformities, masses, or tenderness noted. Lungs:     Normal respiratory effort, chest expands symmetrically. Lungs are clear to auscultation, no crackles or wheezes. Heart:     Normal rate and regular rhythm. S1 and S2 normal without gallop, murmur, click, rub or other extra sounds. Psych:     Cognition and judgment appear intact. Alert and cooperative with normal attention span and  concentration. No apparent delusions, illusions, hallucinations    Impression & Recommendations:  Problem # 1:  VITAMIN B12 DEFICIENCY (ICD-266.2)  Orders: TLB-B12, Serum-Total ONLY (09983-J82)   Problem # 2:  WEAKNESS (ICD-780.79)  Orders: Venipuncture (50539) TLB-BMP (Basic Metabolic Panel-BMET) (80048-METABOL) TLB-CBC Platelet - w/Differential (85025-CBCD) TLB-Hepatic/Liver Function Pnl (80076-HEPATIC) TLB-TSH (Thyroid Stimulating Hormone) (84443-TSH)   Problem # 3:  INSOMNIA (ICD-780.52)  Her updated medication list for this problem includes:    Ambien 10 Mg Tabs (Zolpidem tartrate) .Marland Kitchen... At bedtime   Complete Medication List: 1)  Zyrtec Allergy 10 Mg Tabs (Cetirizine hcl) .... As needed 2)  Alprazolam 0.5 Mg Tabs (Alprazolam) .... Three times a day as needed anxiety 3)  Vicodin 5-500 Mg Tabs (Hydrocodone-acetaminophen) .... Three times a day as needed pain 4)  Hydrochlorothiazide 12.5 Mg Tabs (Hydrochlorothiazide) .... Once daily 5)  Cyanocobalamin 1000 Mcg/ml Soln (Cyanocobalamin) .... One ml per week 6)  Bd Integra Syringe 25g X 1" 3 Ml Misc (Syringe/needle (disp)) .... As directed 7)  Ambien  10 Mg Tabs (Zolpidem tartrate) .... At bedtime   Patient Instructions: 1)  Get labs today including a follow up B12. Try Ambien for sleep. If these are unrevealing, we may give more thought to fibromyalgia as a possible cause for her symptoms.  2)  It is important that you exercise regularly at least 20 minutes 5 times a week. If you develop chest pain, have severe difficulty breathing, or feel very tired , stop exercising immediately and seek medical attention.   Prescriptions: AMBIEN 10 MG TABS (ZOLPIDEM TARTRATE) at bedtime  #30 x 5   Entered and Authorized by:   [name] MD   Signed by:   [name] MD on 06/20/2008   Method used:   Print then Give to Patient   RxID:   [id number]

## 2010-07-01 NOTE — Assessment & Plan Note (Signed)
Summary: TETANUS/PS  Nurse Visit    Prior Medications: ZYRTEC ALLERGY 10 MG  TABS (CETIRIZINE HCL) as needed ALPRAZOLAM 0.5 MG  TABS (ALPRAZOLAM) three times a day as needed anxiety VICODIN 5-500 MG  TABS (HYDROCODONE-ACETAMINOPHEN) three times a day as needed pain FLUCONAZOLE 150 MG  TABS (FLUCONAZOLE) 1 as needed CELEXA 10 MG  TABS (CITALOPRAM HYDROBROMIDE) once daily CIPROFLOXACIN HCL 500 MG  TABS (CIPROFLOXACIN HCL) two times a day Current Allergies: No known allergies    Tetanus/Td Vaccine    Vaccine Type: Tdap    Site: right deltoid    Mfr: [employer]    Dose: 0.5 ml    Route: IM    Given by: [name], CMA    Exp. Date: 07/29/2008    Lot #: [id number]   Orders Added: 1)  Tdap => 61yrs IM [90715] 2)  Admin 1st Vaccine [email or web address]    ]

## 2010-07-01 NOTE — Assessment & Plan Note (Signed)
Summary: sinus infection,shortness of breath/jls   Vital Signs:  Patient profile:   35 year old female Height:      70 inches Weight:      272 pounds BMI:     39.17 Temp:     98.2 degrees F oral BP sitting:   120 / 80  (left arm) Cuff size:   regular  Vitals Entered By: Kern Reap CMA (August 10, 2008 3:15 PM)   History of Present Illness: Here for several problems. First for the past week has had sinus pressure, HA, ST, and a dry cough. No fever. Also, has had facial acne for the past 6 months despite using medicated cleansers OTC daily. Last she asks for help in losing weight. Tries to exercise but finds it diificult to do so. Her appetite can be voracious despite all her efforts to eat well.   Allergies (verified): No Known Drug Allergies  Past Medical History:    Reviewed history from 06/20/2008 and no changes required:       Allergic rhinitis       Headache       GERD       polycystic ovaries syndrome       B12 deficiency       peripheral neuropathy       sees Dr. Henderson Cloud for GYN exams  Review of Systems  The patient denies anorexia, fever, weight loss, weight gain, vision loss, decreased hearing, hoarseness, chest pain, syncope, peripheral edema, hemoptysis, abdominal pain, melena, hematochezia, severe indigestion/heartburn, hematuria, incontinence, genital sores, muscle weakness, suspicious skin lesions, transient blindness, difficulty walking, depression, unusual weight change, abnormal bleeding, enlarged lymph nodes, angioedema, breast masses, and testicular masses.    Physical Exam  General:  overweight-appearing.   Head:  Normocephalic and atraumatic without obvious abnormalities. No apparent alopecia or balding. Eyes:  No corneal or conjunctival inflammation noted. EOMI. Perrla. Funduscopic exam benign, without hemorrhages, exudates or papilledema. Vision grossly normal. Ears:  External ear exam shows no significant lesions or deformities.  Otoscopic examination  reveals clear canals, tympanic membranes are intact bilaterally without bulging, retraction, inflammation or discharge. Hearing is grossly normal bilaterally. Nose:  External nasal examination shows no deformity or inflammation. Nasal mucosa are pink and moist without lesions or exudates. Mouth:  Oral mucosa and oropharynx without lesions or exudates.  Teeth in good repair. Neck:  No deformities, masses, or tenderness noted. Lungs:  Normal respiratory effort, chest expands symmetrically. Lungs are clear to auscultation, no crackles or wheezes. Heart:  Normal rate and regular rhythm. S1 and S2 normal without gallop, murmur, click, rub or other extra sounds. Skin:  papular acne over the face   Impression & Recommendations:  Problem # 1:  ACNE VULGARIS (ICD-706.1)  Her updated medication list for this problem includes:    Benzaclin 1-5 % Gel (Clindamycin phos-benzoyl perox) .Marland Kitchen... Apply two times a day    Augmentin 875-125 Mg Tabs (Amoxicillin-pot clavulanate) .Marland Kitchen..Marland Kitchen Two times a day  Problem # 2:  MORBID OBESITY (ICD-278.01)  Problem # 3:  SINUSITIS, ACUTE FRONTAL (ICD-461.1)  Her updated medication list for this problem includes:    Augmentin 875-125 Mg Tabs (Amoxicillin-pot clavulanate) .Marland Kitchen..Marland Kitchen Two times a day  Complete Medication List: 1)  Alprazolam 0.5 Mg Tabs (Alprazolam) .... Three times a day as needed anxiety 2)  Vicodin 5-500 Mg Tabs (Hydrocodone-acetaminophen) .... Three times a day as needed pain 3)  Hydrochlorothiazide 12.5 Mg Tabs (Hydrochlorothiazide) .... Once daily 4)  Cyanocobalamin 1000 Mcg/ml Soln (Cyanocobalamin) .Marland KitchenMarland KitchenMarland Kitchen  One ml per month 5)  Bd Integra Syringe 25g X 1" 3 Ml Misc (Syringe/needle (disp)) .... As directed 6)  Ambien 10 Mg Tabs (Zolpidem tartrate) .... At bedtime 7)  Promethazine Hcl 25 Mg Tabs (Promethazine hcl) .... One every 4 hours as needed nausea 8)  Benzaclin 1-5 % Gel (Clindamycin phos-benzoyl perox) .... Apply two times a day 9)  Augmentin 875-125 Mg  Tabs (Amoxicillin-pot clavulanate) .... Two times a day 10)  Phentermine Hcl 37.5 Mg Caps (Phentermine hcl) .... Once daily 11)  Fluconazole 150 Mg Tabs (Fluconazole) .... As needed  Patient Instructions: 1)  Please schedule a follow-up appointment as needed . Add Mucinex D. Prescriptions: FLUCONAZOLE 150 MG TABS (FLUCONAZOLE) as needed  #1 x 5   Entered and Authorized by:   [name] MD   Signed by:   [name] MD on 08/10/2008   Method used:   Print then Give to Patient   RxID:   [id number] PHENTERMINE HCL 37.5 MG CAPS (PHENTERMINE HCL) once daily  #30 x 5   Entered and Authorized by:   [name] MD   Signed by:   [name] MD on 08/10/2008   Method used:   Print then Give to Patient   RxID:   [id number] AUGMENTIN 875-125 MG TABS (AMOXICILLIN-POT CLAVULANATE) two times a day  #20 x 0   Entered and Authorized by:   [name] MD   Signed by:   [name] MD on 08/10/2008   Method used:   Print then Give to Patient   RxID:   [id number] BENZACLIN 1-5 % GEL (CLINDAMYCIN PHOS-BENZOYL PEROX) apply two times a day  #30 days x 11   Entered and Authorized by:   [name] MD   Signed by:   [name] MD on 08/10/2008   Method used:   Print then Give to Patient   RxID:   [id number]

## 2010-07-01 NOTE — Progress Notes (Signed)
Summary: refill hydrocodone  Phone Note From Pharmacy   [name] Call For: [name]  Summary of Call: refill hydrocodone 5/500 1 by mouth three times a day as needed for pain Initial call taken by: [name], CMA,  November 09, 2008 10:07 AM  Follow-up for Phone Call        call in #60 with 2 rf Follow-up by: [name] MD,  November 09, 2008 12:41 PM  Additional Follow-up for Phone Call Additional follow up Details #1::        Phone call completed, Pharmacist called Additional Follow-up by: [name], CMA,  November 09, 2008 1:25 PM      Prescriptions: VICODIN 5-500 MG  TABS (HYDROCODONE-ACETAMINOPHEN) three times a day as needed pain  #60 x 2   Entered by:   [name], CMA   Authorized by:   [name] MD   Signed by:   [name], CMA on 11/09/2008   Method used:   Telephoned to ...       [facility] [address] (retail)       [address]       [place], [place]  [address]       Ph: [phone] or [phone]       Fax: [phone]   RxID:   [id number]

## 2010-07-01 NOTE — Progress Notes (Signed)
Summary: refill Alprazolam  Phone Note Refill Request Message from:  Fax from Pharmacy on November 15, 2009 11:22 AM  Refills Requested: Medication #1:  ALPRAZOLAM 0.5 MG  TABS three times a day as needed anxiety   Dosage confirmed as above?Dosage Confirmed   Last Refilled: 09/05/2009  Method Requested: Fax to Local Pharmacy Initial call taken by: Raechel Ache, RN,  November 15, 2009 11:22 AM [name] [phone][facility]  Follow-up for Phone Call        call in #990 with 5 rf Follow-up by: [name] MD,  November 15, 2009 5:10 PM  Additional Follow-up for Phone Call Additional follow up Details #1::        Rx faxed to pharmacy Additional Follow-up by: Raechel Ache, RN,  November 15, 2009 5:12 PM    Prescriptions: ALPRAZOLAM 0.5 MG  TABS (ALPRAZOLAM) three times a day as needed anxiety  #90 x 5   Entered by:   Raechel Ache, RN   Authorized by:   [name] MD   Signed by:   Raechel Ache, RN on 11/15/2009   Method used:   Historical   RxID:   [id number]

## 2010-07-01 NOTE — Progress Notes (Signed)
Summary: refill  Phone Note Call from Patient Call back at Home Phone [phone]   Caller: [facility] triage message Call For: Melissaann Dizdarevic Summary of Call: refill  xanax  Initial call taken by: [name],  May 12, 2007 4:18 PM  Follow-up for Phone Call        call in Xanax 0.5 mg three times a day as needed anxiety, #60 with 5 rf Follow-up by: [name] MD,  May 13, 2007 8:46 AM  Additional Follow-up for Phone Call Additional follow up Details #1::        [facility] Additional Follow-up by: Lynann Beaver CMA,  May 13, 2007 9:49 AM      Prescriptions: ALPRAZOLAM 0.5 MG  TABS (ALPRAZOLAM) three times a day as needed anxiety  #60 x 5   Entered by:   Lynann Beaver CMA   Authorized by:   [name] MD   Signed by:   Lynann Beaver CMA on 05/13/2007   Method used:   Telephoned to ...       [facility]  [address] [id number]       [address]       [place], [place]  [address]       Ph: [phone] or [phone]       Fax: [phone]   RxID:   [id number]  Pt.  notified.

## 2010-07-01 NOTE — Assessment & Plan Note (Signed)
Summary: left foot swelling/njr   Vital Signs:  Patient profile:   35 year old female Weight:      262 pounds Temp:     97.8 degrees F oral Pulse rate:   118 / minute BP sitting:   122 / 84  (left arm) Cuff size:   large  Vitals Entered By: [name], CMA (Oct 11, 2008 9:17 AM) CC: lt foot swollen   History of Present Illness: Here to follow up fibromyalgia, neuropathy, and LE edema. She saw Dr. Corliss Skains recently, but her insurance did not cover this. Of course she does not want to go back to see her. Her main problem now is feet swelling, with the left being worse than the right. They are quite swollen and painful at the end of the day, but are not too bad in the am. Taking HCTZ as usual. No calf pain, no chest pain, no SOB.   Allergies: No Known Drug Allergies  Past History:  Past Medical History:    Allergic rhinitis    Headache    GERD    polycystic ovaries syndrome    B12 deficiency    peripheral neuropathy    sees Dr. Henderson Cloud for GYN exams    fibromyalgia  Past Surgical History:    Reviewed history from 02/07/2007 and no changes required:    Appendectomy    Hysterectomy    Oophorectomy    Tonsillectomy    Sinus surgery 12-14-06  Review of Systems  The patient denies anorexia, fever, weight loss, weight gain, vision loss, decreased hearing, hoarseness, chest pain, syncope, dyspnea on exertion, prolonged cough, headaches, hemoptysis, abdominal pain, melena, hematochezia, severe indigestion/heartburn, hematuria, incontinence, genital sores, muscle weakness, suspicious skin lesions, transient blindness, difficulty walking, depression, unusual weight change, abnormal bleeding, enlarged lymph nodes, angioedema, breast masses, and testicular masses.    Physical Exam  General:  Well-developed,well-nourished,in no acute distress; alert,appropriate and cooperative throughout examination Lungs:  Normal respiratory effort, chest expands symmetrically. Lungs are clear to  auscultation, no crackles or wheezes. Heart:  Normal rate and regular rhythm. S1 and S2 normal without gallop, murmur, click, rub or other extra sounds. Extremities:  2+ edema in the right foot and 3+ edema in the left foot. No erythema or warmth. No calf tenderness. Psych:  Cognition and judgment appear intact. Alert and cooperative with normal attention span and concentration. No apparent delusions, illusions, hallucinations   Impression & Recommendations:  Problem # 1:  LEG EDEMA (ICD-782.3)  The following medications were removed from the medication list:    Hydrochlorothiazide 12.5 Mg Tabs (Hydrochlorothiazide) ..... Once daily Her updated medication list for this problem includes:    Furosemide 20 Mg Tabs (Furosemide) ..... Once daily  Problem # 2:  PERIPHERAL NEUROPATHY (ICD-356.9)  Problem # 3:  ANXIETY DISORDER, GENERALIZED (ICD-300.02)  Her updated medication list for this problem includes:    Alprazolam 0.5 Mg Tabs (Alprazolam) .Marland Kitchen... Three times a day as needed anxiety  Problem # 4:  FIBROMYALGIA (ICD-729.1)  Her updated medication list for this problem includes:    Vicodin 5-500 Mg Tabs (Hydrocodone-acetaminophen) .Marland Kitchen... Three times a day as needed pain    Flexeril 10 Mg Tabs (Cyclobenzaprine hcl) .Marland Kitchen... Three times a day as needed spasm  Complete Medication List: 1)  Alprazolam 0.5 Mg Tabs (Alprazolam) .... Three times a day as needed anxiety 2)  Vicodin 5-500 Mg Tabs (Hydrocodone-acetaminophen) .... Three times a day as needed pain 3)  Cyanocobalamin 1000 Mcg/ml Soln (Cyanocobalamin) .... One ml  per month 4)  Bd Integra Syringe 25g X 1" 3 Ml Misc (Syringe/needle (disp)) .... As directed 5)  Ambien 10 Mg Tabs (Zolpidem tartrate) .... At bedtime 6)  Promethazine Hcl 25 Mg Tabs (Promethazine hcl) .... One every 4 hours as needed nausea 7)  Benzaclin 1-5 % Gel (Clindamycin phos-benzoyl perox) .... Apply two times a day 8)  Phentermine Hcl 37.5 Mg Caps (Phentermine hcl)  .... Once daily 9)  Fluconazole 150 Mg Tabs (Fluconazole) .... As needed 10)  Furosemide 20 Mg Tabs (Furosemide) .... Once daily 11)  Flexeril 10 Mg Tabs (Cyclobenzaprine hcl) .... Three times a day as needed spasm  Patient Instructions: 1)  Switch to Lasix for the edema. She will contact her insurance company to see what rheumatologists are covered in this area.  Prescriptions: FLEXERIL 10 MG TABS (CYCLOBENZAPRINE HCL) three times a day as needed spasm  #90 x 5   Entered and Authorized by:   [name] MD   Signed by:   [name] MD on 10/11/2008   Method used:   Electronically to        [facility] [phone]* (retail)       [address]       [place], [place]  [address]       Ph: [phone] or [phone]       Fax: [phone]   RxID:   [id number] FUROSEMIDE 20 MG TABS (FUROSEMIDE) once daily  #30 x 5   Entered and Authorized by:   [name] MD   Signed by:   [name] MD on 10/11/2008   Method used:   Electronically to        [facility] [phone]* (retail)       [address]       [place], [place]  [address]       Ph: [phone] or [phone]       Fax: [phone]   RxID:   [id number]

## 2010-07-01 NOTE — Assessment & Plan Note (Signed)
Summary: UA CHECK/UTI SYMPTOMS/PS   Vital Signs:  Patient Profile:   35 Years Old Female Weight:      257 pounds Temp:     98.2 degrees F oral Pulse rate:   90 / minute BP sitting:   140 / 100  (right arm) Cuff size:   large  Vitals Entered By: [name], CMA (April 04, 2007 3:48 PM)                 Chief Complaint:  uti.  History of Present Illness: 4 days of urgency to urinate and burning. Took Azostandard and Macrodantin for 3 days with no improvement. No fever. Has been nauseated but no vomiting. Having low back and pelvic pains. Wanted to cut back on her dose of Celexa due to anorgasmia.   Current Allergies: No known allergies   Past Medical History:    Reviewed history from 01/04/2007 and no changes required:       Allergic rhinitis       Headache       GERD       polycystic ovaries syndrome  Past Surgical History:    Reviewed history from 02/07/2007 and no changes required:       Appendectomy       Hysterectomy       Oophorectomy       Tonsillectomy       Sinus surgery 12-14-06     Review of Systems      See HPI   Physical Exam  General:     Well-developed,well-nourished,in no acute distress; alert,appropriate and cooperative throughout examination    Impression & Recommendations:  Problem # 1:  ANXIETY DISORDER, GENERALIZED (ICD-300.02)  The following medications were removed from the medication list:    Celexa 40 Mg Tabs (Citalopram hydrobromide) ..... Once daily  Her updated medication list for this problem includes:    Alprazolam 0.5 Mg Tabs (Alprazolam) .Marland Kitchen... Three times a day as needed anxiety    Celexa 10 Mg Tabs (Citalopram hydrobromide) ..... Once daily   Problem # 2:  ACUTE CYSTITIS (ICD-595.0)  Her updated medication list for this problem includes:    Ciprofloxacin Hcl 500 Mg Tabs (Ciprofloxacin hcl) .Marland Kitchen..Marland Kitchen Two times a day  Orders: UA Dipstick w/o Micro (81002)   Complete Medication List: 1)  Zyrtec Allergy 10 Mg  Tabs (Cetirizine hcl) .... As needed 2)  Alprazolam 0.5 Mg Tabs (Alprazolam) .... Three times a day as needed anxiety 3)  Vicodin 5-500 Mg Tabs (Hydrocodone-acetaminophen) .... Three times a day as needed pain 4)  Fluconazole 150 Mg Tabs (Fluconazole) .Marland Kitchen.. 1 as needed 5)  Celexa 10 Mg Tabs (Citalopram hydrobromide) .... Once daily 6)  Ciprofloxacin Hcl 500 Mg Tabs (Ciprofloxacin hcl) .... Two times a day   Patient Instructions: 1)  Please schedule a follow-up appointment in 1 month.    Prescriptions: FLUCONAZOLE 150 MG  TABS (FLUCONAZOLE) 1 as needed  #1 x 11   Entered and Authorized by:   [name] MD   Signed by:   [name] MD on 04/04/2007   Method used:   Electronically sent to ...       [facility] [id number]       [address]       [place], [place]  [address]       Ph: [phone] or [phone]       Fax: [phone]   RxID:   [id number] CIPROFLOXACIN HCL 500 MG  TABS (  CIPROFLOXACIN HCL) two times a day  #14 x 0   Entered and Authorized by:   [name] MD   Signed by:   [name] MD on 04/04/2007   Method used:   Electronically sent to ...       [facility]  [address] [id number]       [address]       [place], [place]  [address]       Ph: [phone] or [phone]       Fax: [phone]   RxID:   [id number] CELEXA 10 MG  TABS (CITALOPRAM HYDROBROMIDE) once daily  #30 x 11   Entered and Authorized by:   [name] MD   Signed by:   [name] MD on 04/04/2007   Method used:   Electronically sent to ...       [facility]  [address] [id number]       [address]       [place], [place]  [address]       Ph: [phone] or [phone]       Fax: [phone]   RxID:   [id number]  ] Laboratory Results   Urine Tests  Date/Time Recieved: April 04, 2007 3:51 PM Date/Time Reported: April 04, 2007 3:51 PM  Routine Urinalysis   Color: yellow Appearance: Clear Glucose: negative   (Normal Range: Negative) Bilirubin: negative   (Normal  Range: Negative) Ketone: negative   (Normal Range: Negative) Spec. Gravity: 1.020   (Normal Range: 1.003-1.035) Blood: negative   (Normal Range: Negative) pH: 7.5   (Normal Range: 5.0-8.0) Protein: negative   (Normal Range: Negative) Urobilinogen: 0.2   (Normal Range: 0-1) Nitrite: negative   (Normal Range: Negative) Leukocyte Esterace: negative   (Normal Range: Negative)    Comments: ..................................................................Marland KitchenAlfred Levins, CMA  April 04, 2007 3:51 PM

## 2010-07-01 NOTE — Assessment & Plan Note (Signed)
Summary: sinus inf/ear ache/headache/pain and pressure in face/cjr   Vital Signs:  Patient profile:   35 year old female Weight:      272 pounds O2 Sat:      98 % Temp:     98.3 degrees F BP sitting:   130 / 104  (left arm) Cuff size:   large  Vitals Entered By: Pura Spice, RN (January 28, 2010 9:43 AM) CC: [name] infection  left ear painful  needs refill pantoprazole.   History of Present Illness: Here for several days of sinus pressure, HA, ST, and pain in the left ear. No cough or fever. On Motrin and sudafed.   Allergies (verified): No Known Drug Allergies  Past History:  Past Medical History: Reviewed history from 04/08/2009 and no changes required. Allergic rhinitis Headache GERD polycystic ovaries syndrome B12 deficiency peripheral neuropathy sees Dr. Henderson Cloud for GYN exams fibromyalgia Depression  Past Surgical History: Reviewed history from 02/07/2007 and no changes required. Appendectomy Hysterectomy Oophorectomy Tonsillectomy Sinus surgery 12-14-06  Review of Systems  The patient denies anorexia, fever, weight loss, weight gain, vision loss, decreased hearing, hoarseness, chest pain, syncope, dyspnea on exertion, peripheral edema, prolonged cough, hemoptysis, abdominal pain, melena, hematochezia, severe indigestion/heartburn, hematuria, incontinence, genital sores, muscle weakness, suspicious skin lesions, transient blindness, difficulty walking, depression, unusual weight change, abnormal bleeding, enlarged lymph nodes, angioedema, breast masses, and testicular masses.    Physical Exam  General:  Well-developed,well-nourished,in no acute distress; alert,appropriate and cooperative throughout examination Head:  Normocephalic and atraumatic without obvious abnormalities. No apparent alopecia or balding. Eyes:  No corneal or conjunctival inflammation noted. EOMI. Perrla. Funduscopic exam benign, without hemorrhages, exudates or papilledema. Vision grossly  normal. Ears:  left ear canal is full of cerumen and it is inflamed and tender. The right ear is clear Nose:  External nasal examination shows no deformity or inflammation. Nasal mucosa are pink and moist without lesions or exudates. Mouth:  Oral mucosa and oropharynx without lesions or exudates.  Teeth in good repair. Neck:  No deformities, masses, or tenderness noted. Lungs:  Normal respiratory effort, chest expands symmetrically. Lungs are clear to auscultation, no crackles or wheezes.   Impression & Recommendations:  Problem # 1:  CERUMEN IMPACTION (ICD-380.4)  Problem # 2:  SINUSITIS, ACUTE FRONTAL (ICD-461.1)  Her updated medication list for this problem includes:    Levaquin 500 Mg Tabs (Levofloxacin) ..... Once daily  Orders: Admin of Therapeutic Inj  intramuscular or subcutaneous ([address])  Complete Medication List: 1)  Alprazolam 0.5 Mg Tabs (Alprazolam) .... Three times a day as needed anxiety 2)  Vicodin 5-500 Mg Tabs (Hydrocodone-acetaminophen) .... Three times a day as needed pain 3)  Promethazine Hcl 25 Mg Tabs (Promethazine hcl) .... One every 4 hours as needed nausea 4)  Hydrochlorothiazide 12.5 Mg Tabs (Hydrochlorothiazide) .... Take 1 tab  by mouth every morning 5)  Flexeril 10 Mg Tabs (Cyclobenzaprine hcl) .... Three times a day as needed spasm 6)  Pantoprazole Sodium 40 Mg Tbec (Pantoprazole sodium) .Marland Kitchen.. 1 by mouth once daily 7)  Phentermine Hcl 37.5 Mg Caps (Phentermine hcl) .Marland Kitchen.. 1 once daily 8)  Fluconazole 150 Mg Tabs (Fluconazole) .... As needed 9)  Celexa 20 Mg Tabs (Citalopram hydrobromide) .... Once daily 10)  Zolpidem Tartrate 10 Mg Tabs (Zolpidem tartrate) .... At bedtime 11)  Levaquin 500 Mg Tabs (Levofloxacin) .... Once daily  Patient Instructions: 1)  Will refer to ENT to remove the cerumen Prescriptions: PANTOPRAZOLE SODIUM 40 MG TBEC (PANTOPRAZOLE SODIUM) 1  by mouth once daily  #30 x 11   Entered and Authorized by:   [name] MD   Signed by:    [name] MD on 01/28/2010   Method used:   [facility]ronically to        [facility] [phone]* (retail)       [address]       [place], [place]  [address]       Ph: [phone] or [phone]       Fax: [phone]   RxID:   [id number] LEVAQUIN 500 MG TABS (LEVOFLOXACIN) once daily  #10 x 0   Entered and Authorized by:   [name] MD   Signed by:   [name] MD on 01/28/2010   Method used:   Electronically to        [facility] [phone]* (retail)       [address]       [place], [place]  [address]       Ph: [phone] or [phone]       Fax: [phone]   RxID:   [id number]    Medication Administration  Injection # 1:    Medication: Depo- Medrol 80mg     Diagnosis: SINUSITIS, ACUTE FRONTAL (ICD-461.1)    Route: IM    Site: RUOQ gluteus    Exp Date: 08/2012    Lot #: [id number]    Mfr: [employer]    Comments: 120mg  given    Patient tolerated injection without complications    Given by: Raechel Ache, RN (January 28, 2010 10:21 AM)  Orders Added: 1)  Est. Patient Level IV [09326] 2)  Admin of Therapeutic Inj  intramuscular or subcutaneous [71245]

## 2010-07-01 NOTE — Assessment & Plan Note (Signed)
Summary: sinuses//ccm   Vital Signs:  Patient profile:   35 year old female Weight:      255 pounds O2 Sat:      98 % Temp:     98.5 degrees F Pulse rate:   116 / minute BP sitting:   140 / 96  (left arm) Cuff size:   large  Vitals Entered By: Pura Spice, RN (April 02, 2010 1:08 PM) CC: sinus inf using OTC meds    History of Present Illness: Here for one week of sinus pressure, HA, PND, ST, and a dry cough. No fever. On Mucinex D.   Allergies (verified): No Known Drug Allergies  Past History:  Past Medical History: Reviewed history from 04/08/2009 and no changes required. Allergic rhinitis Headache GERD polycystic ovaries syndrome B12 deficiency peripheral neuropathy sees Dr. Henderson Cloud for GYN exams fibromyalgia Depression  Review of Systems  The patient denies anorexia, fever, weight loss, weight gain, vision loss, decreased hearing, hoarseness, chest pain, syncope, dyspnea on exertion, peripheral edema, hemoptysis, abdominal pain, melena, hematochezia, severe indigestion/heartburn, hematuria, incontinence, genital sores, muscle weakness, suspicious skin lesions, transient blindness, difficulty walking, depression, unusual weight change, abnormal bleeding, enlarged lymph nodes, angioedema, breast masses, and testicular masses.    Physical Exam  General:  Well-developed,well-nourished,in no acute distress; alert,appropriate and cooperative throughout examination Head:  Normocephalic and atraumatic without obvious abnormalities. No apparent alopecia or balding. Eyes:  No corneal or conjunctival inflammation noted. EOMI. Perrla. Funduscopic exam benign, without hemorrhages, exudates or papilledema. Vision grossly normal. Ears:  External ear exam shows no significant lesions or deformities.  Otoscopic examination reveals clear canals, tympanic membranes are intact bilaterally without bulging, retraction, inflammation or discharge. Hearing is grossly normal  bilaterally. Nose:  External nasal examination shows no deformity or inflammation. Nasal mucosa are pink and moist without lesions or exudates. Mouth:  Oral mucosa and oropharynx without lesions or exudates.  Teeth in good repair. Neck:  No deformities, masses, or tenderness noted. Lungs:  Normal respiratory effort, chest expands symmetrically. Lungs are clear to auscultation, no crackles or wheezes.   Impression & Recommendations:  Problem # 1:  SINUSITIS, ACUTE FRONTAL (ICD-461.1)  Her updated medication list for this problem includes:    Levaquin 500 Mg Tabs (Levofloxacin) ..... Once daily  Orders: Depo- Medrol 80mg  (J1040) Depo- Medrol 40mg  (J1030) Admin of Therapeutic Inj  intramuscular or subcutaneous (18841)  Complete Medication List: 1)  Alprazolam 0.5 Mg Tabs (Alprazolam) .... Three times a day as needed anxiety 2)  Vicodin 5-500 Mg Tabs (Hydrocodone-acetaminophen) .... Three times a day as needed pain 3)  Promethazine Hcl 25 Mg Tabs (Promethazine hcl) .... One every 4 hours as needed nausea 4)  Hydrochlorothiazide 12.5 Mg Tabs (Hydrochlorothiazide) .... Take 1 tab  by mouth every morning 5)  Flexeril 10 Mg Tabs (Cyclobenzaprine hcl) .... Three times a day as needed spasm 6)  Pantoprazole Sodium 40 Mg Tbec (Pantoprazole sodium) .Marland Kitchen.. 1 by mouth once daily 7)  Phentermine Hcl 37.5 Mg Caps (Phentermine hcl) .Marland Kitchen.. 1 once daily 8)  Fluconazole 150 Mg Tabs (Fluconazole) .... As needed 9)  Celexa 20 Mg Tabs (Citalopram hydrobromide) .... Once daily 10)  Zolpidem Tartrate 10 Mg Tabs (Zolpidem tartrate) .... At bedtime 11)  Levaquin 500 Mg Tabs (Levofloxacin) .... Once daily 12)  Lortab 7.5-500 Mg Tabs (Hydrocodone-acetaminophen) .Marland Kitchen.. 1 q 6 hours as needed for pain  Patient Instructions: 1)  Please schedule a follow-up appointment as needed .  Prescriptions: LORTAB 7.5-500 MG TABS (HYDROCODONE-ACETAMINOPHEN)  1 q 6 hours as needed for pain  #60 x 2   Entered and Authorized by:    [name] MD   Signed by:   [name] MD on 04/02/2010   Method used:   Print then Give to Patient   RxID:   [id number] LEVAQUIN 500 MG TABS (LEVOFLOXACIN) once daily  #7 x 0   Entered and Authorized by:   [name] MD   Signed by:   [name] MD on 04/02/2010   Method used:   Print then Give to Patient   RxID:   [id number]    Medication Administration  Injection # 1:    Medication: Depo- Medrol 80mg     Diagnosis: SINUSITIS, ACUTE FRONTAL (ICD-461.1)    Route: IM    Site: RUOQ gluteus    Exp Date: 10/2012    Lot #: [id number]    Mfr: [employer]    Patient tolerated injection without complications    Given by: Pura Spice, RN (April 02, 2010 1:46 PM)  Injection # 2:    Medication: Depo- Medrol 40mg     Diagnosis: SINUSITIS, ACUTE FRONTAL (ICD-461.1)    Route: IM    Site: RUOQ gluteus    Exp Date: 10/2012    Lot #: [id number]    Mfr: [employer]    Patient tolerated injection without complications    Given by: Pura Spice, RN (April 02, 2010 1:46 PM)  Orders Added: 1)  Est. Patient Level IV [69678] 2)  Depo- Medrol 80mg  [J1040] 3)  Depo- Medrol 40mg  [J1030] 4)  Admin of Therapeutic Inj  intramuscular or subcutaneous [93810]

## 2010-07-01 NOTE — Progress Notes (Signed)
Summary: REFILL  Phone Note Call from Patient Call back at [phone]   Caller: patient ( live) Call For: [name] Summary of Call: CAN HE CALL HER IN SOME PHENEGREN  HAVE HAD THE PHARMACY SEND OVER A REFILL REQUEST FOR HER MIGRAINE PAIN MEDS  SHE GETS  VERY NAUSEATED WHEN SHE GETS A MIGRAIN AND LIKES TO KEEP THE PHENEGREN ON HAND JUST IN CASE [facility] also wants a refill on hydrocodone 5/500 1 by mouth three times a day as needed  Initial call taken by: [name],  April 22, 2007 11:47 AM  Follow-up for Phone Call        call in Vicodin 5/500 as needed pain ,#60 with 2 rf ,also Phenergan 25 mg every 4 hours as needed nausea, #60 with 2 rf Follow-up by: [name] MD,  April 22, 2007 4:17 PM  Additional Follow-up for Phone Call Additional follow up Details #1::        Phone Call Completed, Rx Called In Additional Follow-up by: [name], CMA,  April 22, 2007 4:31 PM

## 2010-07-01 NOTE — Progress Notes (Signed)
Summary: refill hydrocodone  Phone Note From Pharmacy   [name] Call For: Minie Roadcap  Summary of Call: refill hydrocodone 5/500 1 by mouth three times a day as needed Initial call taken by: [name], CMA,  December 27, 2007 9:47 AM  Follow-up for Phone Call        call in #60 with 3 rf Follow-up by: [name] MD,  December 27, 2007 5:17 PM  Additional Follow-up for Phone Call Additional follow up Details #1::        Phone call completed, Pharmacist called Additional Follow-up by: [name], CMA,  December 28, 2007 7:47 AM      Prescriptions: VICODIN 5-500 MG  TABS (HYDROCODONE-ACETAMINOPHEN) three times a day as needed pain  #60 x 3   Entered by:   [name], CMA   Authorized by:   [name] MD   Signed by:   [name], CMA on 12/28/2007   Method used:   [facility]ed to ...       [facility] [facility] [id number]       [address]       [place], [place]  [address]       Ph: [phone] or [phone]       Fax: [phone]   RxID:   [id number]

## 2010-07-01 NOTE — Letter (Signed)
Summary: [facility]  [facility]   Imported By: Maryln Gottron 01/23/2010 15:22:14  _____________________________________________________________________  External Attachment:    Type:   Image     Comment:   External Document

## 2010-07-01 NOTE — Progress Notes (Signed)
Summary: sinus infection  Phone Note Call from Patient   Caller: Patient Call For: [name] MD Summary of Call: Pt took the Zpack for a sinus infection, and it was better for a while, but it is back.  Usually takes Levaquin and it takes care of it. [facility] [phone] Initial call taken by: Lynann Beaver CMA,  July 11, 2009 2:40 PM  Follow-up for Phone Call        call in Levaquin 500 mg once daily for 10 days Follow-up by: [name] MD,  July 12, 2009 10:11 AM  Additional Follow-up for Phone Call Additional follow up Details #1::        let him know has been escripted. thanks Additional Follow-up by: Pura Spice, RN,  July 12, 2009 11:13 AM    New/Updated Medications: LEVAQUIN 500 MG TABS (LEVOFLOXACIN) 1 by mouth once daily for 10 days Prescriptions: LEVAQUIN 500 MG TABS (LEVOFLOXACIN) 1 by mouth once daily for 10 days  #10 x 0   Entered by:   Pura Spice, RN   Authorized by:   [name] MD   Signed by:   Pura Spice, RN on 07/12/2009   Method used:   Electronically to        [facility] [phone] (retail)       [address]       [place], [place]  [address]       Ph: [phone] or [phone]       Fax: [phone]   RxID:   [id number]  Pt notified.

## 2010-07-01 NOTE — Assessment & Plan Note (Signed)
Summary: ?bladder inf/njr   Vital Signs:  Patient profile:   35 year old female Weight:      289 pounds Temp:     98.0 degrees F oral  Vitals Entered By: Duard Brady LPN (November 01, 979 4:11 PM)  History of Present Illness: 35 year old patient who had the abrupt onset of urinary frequency, dysuria and urgency today.  She has had some occasional urinary tract infections in the past.  She is concerned about any antibiotic associated vaginal yeast infection.  She has been treated with Cipro in the past, which she has tolerated well  Allergies (verified): No Known Drug Allergies  Past History:  Past Medical History: Reviewed history from 04/08/2009 and no changes required. Allergic rhinitis Headache GERD polycystic ovaries syndrome B12 deficiency peripheral neuropathy sees Dr. Henderson Cloud for GYN exams fibromyalgia Depression  Physical Exam  General:  overweight-appearing.  normal blood pressure, afebrile Abdomen:  no abdominal pain, or CVA tenderness   Impression & Recommendations:  Problem # 1:  DYSURIA (ICD-788.1)  Her updated medication list for this problem includes:    Ciprofloxacin Hcl 500 Mg Tabs (Ciprofloxacin hcl) ..... One twice daily  Orders: UA Dipstick w/o Micro (manual) (19147)  Problem # 2:  UTI (ICD-599.0)  Her updated medication list for this problem includes:    Ciprofloxacin Hcl 500 Mg Tabs (Ciprofloxacin hcl) ..... One twice daily  Complete Medication List: 1)  Alprazolam 0.5 Mg Tabs (Alprazolam) .... Three times a day as needed anxiety 2)  Vicodin 5-500 Mg Tabs (Hydrocodone-acetaminophen) .... Three times a day as needed pain 3)  Promethazine Hcl 25 Mg Tabs (Promethazine hcl) .... One every 4 hours as needed nausea 4)  Hydrochlorothiazide 12.5 Mg Tabs (Hydrochlorothiazide) .... Take 1 tab  by mouth every morning 5)  Flexeril 10 Mg Tabs (Cyclobenzaprine hcl) .... Three times a day as needed spasm 6)  Pantoprazole Sodium 40 Mg Tbec  (Pantoprazole sodium) .Marland Kitchen.. 1 by mouth once daily 7)  Ambien Cr 12.5 Mg Cr-tabs (Zolpidem tartrate) .... At bedtime 8)  Celebrex 200 Mg Caps (Celecoxib) .Marland Kitchen.. 1 once daily 9)  Wellbutrin Xl 150 Mg Xr24h-tab (Bupropion hcl) .... Once daily 10)  Ciprofloxacin Hcl 500 Mg Tabs (Ciprofloxacin hcl) .... One twice daily 11)  Fluconazole 150 Mg Tabs (Fluconazole) .... One single dose  Patient Instructions: 1)  Drink as much fluid as you can tolerate for the next few days. 2)  Take your antibiotic as prescribed until ALL of it is gone, but stop if you develop a rash or swelling and contact our office as soon as possible. Prescriptions: FLUCONAZOLE 150 MG TABS (FLUCONAZOLE) one single dose  #0 x 0   Entered and Authorized by:   Gordy Savers  MD   Signed by:   Gordy Savers  MD on 10/31/2009   Method used:   Print then Give to Patient   RxID:   [id number] CIPROFLOXACIN HCL 500 MG TABS (CIPROFLOXACIN HCL) one twice daily  #14 x 0   Entered and Authorized by:   Gordy Savers  MD   Signed by:   Gordy Savers  MD on 10/31/2009   Method used:   Electronically to        [facility] [phone]* (retail)       [address]       [place], [place]  [address]       Ph: [phone] or [phone]       Fax: [phone]   RxID:  [id number]   Appended Document: ?bladder inf/njr     Allergies: No Known Drug Allergies   Complete Medication List: 1)  Alprazolam 0.5 Mg Tabs (Alprazolam) .... Three times a day as needed anxiety 2)  Vicodin 5-500 Mg Tabs (Hydrocodone-acetaminophen) .... Three times a day as needed pain 3)  Promethazine Hcl 25 Mg Tabs (Promethazine hcl) .... One every 4 hours as needed nausea 4)  Hydrochlorothiazide 12.5 Mg Tabs (Hydrochlorothiazide) .... Take 1 tab  by mouth every morning 5)  Flexeril 10 Mg Tabs (Cyclobenzaprine hcl) .... Three times a day as needed spasm 6)  Pantoprazole Sodium 40 Mg Tbec (Pantoprazole sodium) .Marland Kitchen.. 1 by mouth once  daily 7)  Ambien Cr 12.5 Mg Cr-tabs (Zolpidem tartrate) .... At bedtime 8)  Celebrex 200 Mg Caps (Celecoxib) .Marland Kitchen.. 1 once daily 9)  Wellbutrin Xl 150 Mg Xr24h-tab (Bupropion hcl) .... Once daily 10)  Ciprofloxacin Hcl 500 Mg Tabs (Ciprofloxacin hcl) .... One twice daily 11)  Fluconazole 150 Mg Tabs (Fluconazole) .... One single dose   Laboratory Results   Urine Tests    Routine Urinalysis   Color: orange Appearance: Clear Blood: moderate   (Normal Range: Negative)    Comments: pt ahd taken Azo standard prior to coming to office appt. KIK

## 2010-07-01 NOTE — Progress Notes (Signed)
Summary: refill xanax  Phone Note From Pharmacy   [name] Call For: [name]  Summary of Call: refill xanax 0.5mg  1 by mouth three times a day as needed for anxiety Initial call taken by: [name], CMA,  August 10, 2008 11:13 AM  Follow-up for Phone Call        call in #90 with 5 rf Follow-up by: [name] MD,  August 10, 2008 12:36 PM  Additional Follow-up for Phone Call Additional follow up Details #1::        Pharmacist called Additional Follow-up by: Kern Reap CMA,  August 10, 2008 4:39 PM

## 2010-07-01 NOTE — Progress Notes (Signed)
Summary: rx hydrocodone   Phone Note From Pharmacy   [name] Reason for Call: Needs renewal Summary of Call: refill hydrocodone 5-500  Initial call taken by: Pura Spice, RN,  March 04, 2010 5:02 PM  Follow-up for Phone Call        call in #60 with 2 rf Follow-up by: [name] MD,  March 05, 2010 1:02 PM  Additional Follow-up for Phone Call Additional follow up Details #1::        [facility] Additional Follow-up by: Kathrynn Speed CMA,  March 05, 2010 3:41 PM    Prescriptions: VICODIN 5-500 MG  TABS (HYDROCODONE-ACETAMINOPHEN) three times a day as needed pain  #60 x 2   Entered by:   Kathrynn Speed CMA   Authorized by:   [name] MD   Signed by:   Kathrynn Speed CMA on 03/05/2010   Method used:   Telephoned to ...       [facility] [address] (retail)       [address]       [place], [place]  [address]       Ph: [phone] or [phone]       Fax: [phone]   RxID:   [id number]

## 2010-07-01 NOTE — Progress Notes (Signed)
Summary: Rx for Phentermine  Phone Note From Pharmacy   [name] Reason for Call: Needs renewal Summary of Call: refill Phentermine  last fillde 05/07/09 Initial call taken by: Raechel Ache, RN,  November 20, 2009 1:55 PM  Follow-up for Phone Call        call in Phentermine 37.5 mg once daily , #30 with 5 rf Follow-up by: [name] MD,  November 20, 2009 4:56 PM    New/Updated Medications: PHENTERMINE HCL 37.5 MG CAPS (PHENTERMINE HCL) 1 once daily Prescriptions: PHENTERMINE HCL 37.5 MG CAPS (PHENTERMINE HCL) 1 once daily  #30 x 5   Entered by:   Raechel Ache, RN   Authorized by:   [name] MD   Signed by:   Raechel Ache, RN on 11/20/2009   Method used:   Historical   RxID:   [id number]

## 2010-07-01 NOTE — Progress Notes (Signed)
Summary: refill hydrocodone  Phone Note From Pharmacy   [name] Call For: [name]  Summary of Call: refill hydrocodone 5/500 1 by mouth tid as needed for pain Initial call taken by: [name], CMA,  May 23, 2008 1:13 PM  Follow-up for Phone Call        call in #60 with 5 rf Follow-up by: [name] MD,  May 23, 2008 3:17 PM  Additional Follow-up for Phone Call Additional follow up Details #1::        Phone call completed, Pharmacist called Additional Follow-up by: [name], CMA,  May 23, 2008 3:24 PM      Prescriptions: VICODIN 5-500 MG  TABS (HYDROCODONE-ACETAMINOPHEN) three times a day as needed pain  #60 x 3   Entered by:   [name], CMA   Authorized by:   [name] MD   Signed by:   [name], CMA on 05/23/2008   Method used:   Telephoned to ...       [facility] [phone]* (retail)       [address]       [place], [place]  [address]       Ph: [phone] or [phone]       Fax: [phone]   RxID:   [id number]

## 2010-07-03 NOTE — Progress Notes (Signed)
Summary: refill  Phone Note From Pharmacy   [name] Reason for Call: Needs renewal Details for Reason: phentermine 37.5 Summary of Call: do not refill. We need to take a break from this for awhile. Call us back in 6 months   [facility] Initial call taken by: Romualdo Bolk, CMA Duncan Dull),  May 15, 2010 4:27 PM  Follow-up for Phone Call        Notified pt. Follow-up by: Lynann Beaver CMA AAMA,  May 16, 2010 4:41 PM

## 2010-07-11 ENCOUNTER — Other Ambulatory Visit: Payer: Self-pay

## 2010-07-11 DIAGNOSIS — R52 Pain, unspecified: Secondary | ICD-10-CM

## 2010-07-11 DIAGNOSIS — F419 Anxiety disorder, unspecified: Secondary | ICD-10-CM

## 2010-07-11 MED ORDER — ALPRAZOLAM 0.5 MG PO TABS: 0.5000 mg | ORAL_TABLET | Freq: Three times a day (TID) | ORAL | Status: DC | PRN

## 2010-07-11 MED ORDER — HYDROCODONE-ACETAMINOPHEN 7.5-500 MG PO TABS: 1.0000 | ORAL_TABLET | Freq: Four times a day (QID) | ORAL | Status: DC | PRN

## 2010-07-11 NOTE — Telephone Encounter (Signed)
[id number]  rx for alprazolam and and hydrocodone 7.5-500

## 2010-07-14 ENCOUNTER — Ambulatory Visit (INDEPENDENT_AMBULATORY_CARE_PROVIDER_SITE_OTHER): Payer: BC Managed Care – PPO | Admitting: Family Medicine

## 2010-07-14 ENCOUNTER — Encounter: Payer: Self-pay | Admitting: Family Medicine

## 2010-07-14 VITALS — BP 130/100 | Temp 99.1°F | Wt 253.0 lb

## 2010-07-14 DIAGNOSIS — J329 Chronic sinusitis, unspecified: Secondary | ICD-10-CM

## 2010-07-14 MED ORDER — LEVOFLOXACIN 500 MG PO TABS: 500.0000 mg | ORAL_TABLET | Freq: Every day | ORAL | Status: AC

## 2010-07-14 MED ORDER — FLUCONAZOLE 100 MG PO TABS: 100.0000 mg | ORAL_TABLET | Freq: Every day | ORAL | Status: AC | PRN

## 2010-07-14 NOTE — Progress Notes (Signed)
  Subjective:    Patient ID: [name], female    DOB: [birth year], 35 y.o.   MRN: [id number]  HPI Here for one week of sinus pressure, HA, PND, and ST. No fever or cough.    Review of Systems  Constitutional: Negative.   HENT: Positive for congestion, sore throat, postnasal drip and sinus pressure. Negative for ear pain, nosebleeds and rhinorrhea.   Eyes: Negative.   Respiratory: Negative.        Objective:   Physical Exam  Constitutional: She appears well-developed and well-nourished. No distress.  HENT:  Head: Normocephalic and atraumatic.  Right Ear: External ear normal.  Left Ear: External ear normal.  Nose: Nose normal.  Mouth/Throat: Oropharynx is clear and moist. No oropharyngeal exudate.  Eyes: Conjunctivae and EOM are normal. Pupils are equal, round, and reactive to light. Left eye exhibits no discharge.  Neck: Normal range of motion. Neck supple.  Pulmonary/Chest: Effort normal and breath sounds normal. No respiratory distress. She has no wheezes. She has no rales. She exhibits no tenderness.  Lymphadenopathy:    She has no cervical adenopathy.          Assessment & Plan:  Recurrent sinusitis

## 2010-08-14 LAB — DIFFERENTIAL
Basophils Absolute: 0 10*3/uL (ref 0.0–0.1)
Basophils Relative: 1 % (ref 0–1)
Eosinophils Absolute: 0.1 10*3/uL (ref 0.0–0.7)
Eosinophils Relative: 1 % (ref 0–5)
Lymphocytes Relative: 40 % (ref 12–46)
Lymphs Abs: 3.6 10*3/uL (ref 0.7–4.0)
Monocytes Absolute: 0.5 10*3/uL (ref 0.1–1.0)
Monocytes Relative: 6 % (ref 3–12)
Neutro Abs: 4.9 10*3/uL (ref 1.7–7.7)
Neutrophils Relative %: 53 % (ref 43–77)

## 2010-08-14 LAB — COMPREHENSIVE METABOLIC PANEL
ALT: 13 U/L (ref 0–35)
AST: 18 U/L (ref 0–37)
Albumin: 4.2 g/dL (ref 3.5–5.2)
Alkaline Phosphatase: 73 U/L (ref 39–117)
BUN: 19 mg/dL (ref 6–23)
CO2: 24 mEq/L (ref 19–32)
Calcium: 9.1 mg/dL (ref 8.4–10.5)
Chloride: 106 mEq/L (ref 96–112)
Creatinine, Ser: 0.9 mg/dL (ref 0.4–1.2)
GFR calc Af Amer: >60 mL/min (ref >60)
GFR calc non Af Amer: >60 mL/min (ref >60)
Glucose, Bld: 73 mg/dL (ref 70–99)
Potassium: 4.2 mEq/L (ref 3.5–5.1)
Sodium: 143 mEq/L (ref 135–145)
Total Bilirubin: 0.5 mg/dL (ref 0.3–1.2)
Total Protein: 7.4 g/dL (ref 6.0–8.3)

## 2010-08-14 LAB — CBC
HCT: 40.8 % (ref 36.0–46.0)
Hemoglobin: 14.1 g/dL (ref 12.0–15.0)
MCH: 31 pg (ref 26.0–34.0)
MCHC: 34.7 g/dL (ref 30.0–36.0)
MCV: 89.4 fL (ref 78.0–100.0)
Platelets: 314 10*3/uL (ref 150–400)
RBC: 4.57 MIL/uL (ref 3.87–5.11)
RDW: 11.9 % (ref 11.5–15.5)
WBC: 9.1 10*3/uL (ref 4.0–10.5)

## 2010-08-14 LAB — URINALYSIS, ROUTINE W REFLEX MICROSCOPIC
Bilirubin Urine: NEGATIVE
Glucose, UA: NEGATIVE mg/dL
Hgb urine dipstick: NEGATIVE
Ketones, ur: NEGATIVE mg/dL
Nitrite: NEGATIVE
Protein, ur: NEGATIVE mg/dL
Specific Gravity, Urine: 1.031 — ABNORMAL HIGH (ref 1.005–1.030)
Urobilinogen, UA: 0.2 mg/dL (ref 0.0–1.0)
pH: 6 (ref 5.0–8.0)

## 2010-08-14 LAB — LIPASE, BLOOD: Lipase: 92 U/L (ref 23–300)

## 2010-08-27 ENCOUNTER — Other Ambulatory Visit: Payer: Self-pay

## 2010-08-28 ENCOUNTER — Other Ambulatory Visit: Payer: Self-pay

## 2010-08-28 MED ORDER — PHENTERMINE HCL 37.5 MG PO CAPS: 37.5000 mg | ORAL_CAPSULE | ORAL | Status: DC

## 2010-08-28 NOTE — Telephone Encounter (Signed)
Call in Phentermine 37.5 mg q day, #30 with 5 rf

## 2010-08-28 NOTE — Telephone Encounter (Signed)
rx phentermine called [facility]

## 2010-08-28 NOTE — Telephone Encounter (Signed)
phetermine rx ok 'd by dr fry and faxed and called   in to [facility] and

## 2010-10-03 ENCOUNTER — Encounter: Payer: Self-pay | Admitting: Family Medicine

## 2010-10-03 ENCOUNTER — Ambulatory Visit (INDEPENDENT_AMBULATORY_CARE_PROVIDER_SITE_OTHER): Payer: BC Managed Care – PPO | Admitting: Family Medicine

## 2010-10-03 VITALS — BP 120/84 | Temp 98.6°F | Wt 241.0 lb

## 2010-10-03 DIAGNOSIS — J329 Chronic sinusitis, unspecified: Secondary | ICD-10-CM

## 2010-10-03 MED ORDER — FLUTICASONE PROPIONATE 50 MCG/ACT NA SUSP: 2.0000 | Freq: Every day | NASAL | Status: DC

## 2010-10-03 MED ORDER — LEVOFLOXACIN 500 MG PO TABS: 500.0000 mg | ORAL_TABLET | Freq: Every day | ORAL | Status: DC

## 2010-10-03 MED ORDER — METHYLPREDNISOLONE ACETATE 80 MG/ML IJ SUSP
120.0000 mg | Freq: Once | INTRAMUSCULAR | Status: AC
  Administered 2010-10-03: 120 mg via INTRAMUSCULAR

## 2010-10-03 MED ORDER — HYDROCHLOROTHIAZIDE 12.5 MG PO CAPS: 12.5000 mg | ORAL_CAPSULE | Freq: Every day | ORAL | Status: DC

## 2010-10-03 NOTE — Progress Notes (Signed)
  Subjective:    Patient ID: [name], female    DOB: Oct 18, 1975, 35 y.o.   MRN: [id number]  HPI Here for another sinus infection. For 4 days she had had a HA, sinus pressure, PND, ST and a dry cough. No fever.    Review of Systems  Constitutional: Negative.   HENT: Positive for congestion, postnasal drip and sinus pressure.   Eyes: Negative.   Respiratory: Positive for cough.        Objective:   Physical Exam  Constitutional: She appears well-developed and well-nourished.  HENT:  Right Ear: External ear normal.  Left Ear: External ear normal.  Nose: Nose normal.  Mouth/Throat: Oropharynx is clear and moist. No oropharyngeal exudate.  Eyes: Conjunctivae are normal. Pupils are equal, round, and reactive to light.  Neck: Normal range of motion. Neck supple.  Pulmonary/Chest: Effort normal and breath sounds normal.  Lymphadenopathy:    She has no cervical adenopathy.          Assessment & Plan:  Refilled Flonase. Add Mucinex prn

## 2010-10-17 NOTE — Op Note (Signed)
NAMEZURISADAI, HELMINIAK              ACCOUNT NO.:  [id number]   MEDICAL RECORD NO.:  [id number]          PATIENT TYPE:  AMB   LOCATION:  SDC                           FACILITY:  [facility]   PHYSICIAN:  Guy Sandifer. Henderson Cloud, M.D. DATE OF BIRTH:  May 31, 1976   DATE OF PROCEDURE:  DATE OF DISCHARGE:                                 OPERATIVE REPORT   PREOPERATIVE DIAGNOSIS:  Pelvic pain.   POSTOPERATIVE DIAGNOSES:  1.  Right ovarian cyst.  2.  Abdominal adhesions.   PROCEDURE:  Laparoscopy with biopsy of right ovary and lysis of adhesions,  and resection of a nevus from mons pubis.   SURGEON:  Guy Sandifer. Henderson Cloud, M.D.   ANESTHESIA:  General with endotracheal intubation.   ESTIMATED BLOOD LOSS:  Minimal.   INDICATIONS AND CONSENT:  This patient is a 35 year old married white  female, G2, P2, status post TVH and subsequent LSO with right lower quadrant  pain now generalized to both lower quadrant. Details dictated in the history  and physical.  Laparoscopy, possible right ovarian cystectomy, possible  right salpingo-oophorectomy and removal of the nevus has was then discussed  preoperatively.  Potential risks and complications have been reviewed  preoperatively including but limited to infection, bowel, bladder, ureteral  damage, bleeding requiring transfusion of blood products, possible  transfusion reaction, HIV and hepatitis acquisition, DVT, PE, pneumonia and  recurrent pain.  All questions were answered and consent signed on the  chart.   FINDINGS:  Her abdomen is grossly normal.  At the level of the right pelvic  brim, the bowel was adherent to the anterior abdominal wall.  The pelvis,  posterior cul-de-sac, anterior cul-de-sac and left pelvic sidewall are  clean.  The right ovary contains approximately 1.5 to 2 cm cyst.  The  ovaries are otherwise smooth and without adhesions.   DESCRIPTION OF PROCEDURE:  The patient is taken to the operating room where  she is identified, placed in  the dorsal supine position and general  anesthesia was induced via endotracheal intubation.  She was  then placed in  the dorsal lithotomy position where she is prepped abdominally and  vaginally.  Bladder straight catheterized, and she is draped in the sterile  fashion. A small infraumbilical incision was made, and a disposable Veress  needle was placed.  The syringe and drop test were normal.  Two liters of  gas were insufflated under low pressure with good tympany in the right upper  quadrant.  The Veress needle was then removed and a 10/11 XL disposable  bladeless trocar sleeve was placed using the direct visualization method  with a diagnostic laparoscopic.  Trocars were removed, and reinspection with  the operative laparoscopic reveals proper placement.  No damage to  surrounding structures is  noted.  A small suprapubic and later left lower  quadrant incision was made after careful transillumination and two mm  bladeless XL trocar sleeves were placed under direct visualization without  difficulty.  The above findings were noted.  Attempts at aspirating the cyst  on the right ovary produced no fluid.  The harmonic scalpel was then  used to  biopsy the right ovary, in essence, removing the cystic structure from the  ovary.  Good hemostasis was obtained with the harmonic scalpel and bipolar  cautery. Then putting a gentle traction on the bowel to keep the well clear  of area dissection, the Harmonic  scalpel was used to take down the  adhesions of the bowel to the anterior abdominal wall. Good hemostasis was  maintained throughout.  Copious irrigation was then carried out and careful  inspection at all points reveal good hemostasis all around.  Interceed was  back loaded to the laparoscopic and placed over the area dissection in the  right pelvic brim as well as over the right ovary.  Excess fluid was  removed.  Inferior trocar sleeves were removed and good hemostasis was  noted.   Pneumoperitoneum was completely reduced, and the umbilical trocar  sleeve was removed.  The nevus on the superior aspect of the mons pubis was  then resected in a simple elliptical fashion. A 3-0 Vicryl suture was then  used to close the umbilical incision.  The subcuticular layers with care  being taken not to pick up any underlying structures.  A 3-0 Vicryl was also  used to close the point of nevus removal in a simple fashion.  All incisions  were injected with 0.5% plain Marcaine, and Dermabond was applied to all of  the incisions.  All counts correct.  The patient is awakened and taken to  recovery room in stable condition.      Guy Sandifer Henderson Cloud, M.D.  Electronically Signed     JET/MEDQ  D:  03/04/2005  T:  03/04/2005  Job:  [id number]

## 2010-10-17 NOTE — Op Note (Signed)
[facility]  Patient:    [name], [name]                     MRN: [id number] Proc. Date: 12/30/99 Adm. Date:  [id number] Disc. Date: [id number] Attending:  Donne Hazel                           Operative Report  PREOPERATIVE DIAGNOSIS:       Chronic and acute pelvic pain.  Left ovarian cyst.  POSTOPERATIVE DIAGNOSIS:      Chronic and acute pelvic pain.  Left ovarian cyst.  Two benign ovarian cysts noted.  OPERATION:                    Laparoscopy.  Lysis of adhesions - minor.  Left ovarian cystectomy x 2.  SURGEON:                      Willey Blade, M.D.  ASSISTANT:  ANESTHESIA:                   General endotracheal.  ESTIMATED BLOOD LOSS:         Less than 20 cc.  COMPLICATIONS:                None.  FINDINGS:                     At time of surgery, the left ovary was noted to be enlarged with two dominant ovarian cysts.  These were both clear fluid filled benign appearing ovarian cysts.  One was 4 cm x 4 cm in size, and the other 2 x 2 cm in size.  There were also some minor pelvic adhesions around the left ovary, most notably to the left pelvic side wall.  The right ovary was visualized and noted to be normal.  The uterus was surgically absent.  The patient from her previous appendectomy had adhesions from the right anther abdominal wall to the sacral region of the colon.  The upper abdomen was visualized briefly and noted to be normal.  The bowel was also normal as well.  DESCRIPTION OF PROCEDURE:     The patient was taken to the operating room where general endotracheal anesthetic was administered.  The patient was placed on the operating table in the dorsal lithotomy position, the abdomen, perineum and vagina were prepped and draped in the usual sterile fashion with Betadine and sterile drapes.  The bladder was entered with a red rubber catheter.  A sponge forceps was then placed in the vagina to denote the vaginal cuff.   A small vertical infraumbilical skin incision was made through which a Veress needle was inserted atraumatically into abdominal cavity.  A pneumoperitoneum was then created with 2.5 L of carbon dioxide gas.  The Veress needle was removed and a disposable laparoscopic trocar was inserted atraumatically into the abdominal cavity.  The laparoscope was then placed with the above noted findings.  A secondary incision was made two finger breadths above the pubic symphysis and in the midline.  Through this a 5 mm trocar was placed atraumatically.  First, the left ovary was freed from the adhesions to the left pelvic side wall.  This was done with blunt and sharp dissection.  This was done mainly with sharp dissection with the laparoscopic scissors.  Bipolar cautery was used when necessary.  The two ovarian cysts were very benign in appearance, and were therefore not biopsied.  These areas were cauterized, opened and drained.  This was done also with the bipolar cautery and sharp dissection.  The base of the ovarian cyst was cauterized with the bipolar cautery.  The pelvis was then thoroughly irrigated with copious amounts of irrigant.  Good hemostasis from the operative areas was noted.  Approximately 300 cc of Lactated Ringers left inside the abdomen at the end of the operation.  All vaginal instruments and abdominal instruments were then removed.  The gas was allowed to escape fully from the abdomen. Attention was then turned to closure.  The infraumbilical skin incision was closed, first with three interrupted sutures of 0 Vicryl in the muscle fascia. The skin was reapproximated with multiple interrupted sutures of 3-0 Vicryl rapide.  The patient was then awakened, extubated and taken to the recovery room in good condition.  There were no perioperative complications. DD:  01/19/00 TD:  01/19/00 Job: [id number]

## 2010-10-17 NOTE — Assessment & Plan Note (Signed)
[facility] OFFICE NOTE   NAME:[name], CITLALLI WEIKEL                     MRN:          [id number]  DATE:09/01/2006                            DOB:          [birth year]    This is a 35 year old woman here to establish with our practice with a  long list of complaints and a very complicated history. She has not had  a primary care physician in many years, but has seen numerous  specialists as outlined below. One of her chief ongoing problems is  chronic headaches. She has been seeing Dr. Rene Kocher for the past  four years for her headaches which he feels are primarily due to  migraine headaches. She does think that a lot of her headaches are  migraines which generalized to the entire top of her head, pounding with  a severe headache. She has light and sound sensitivity and often gets  very nauseated and occasionally vomits with these headaches. However,  she also has daily headaches of a different nature, which she thinks are  due to her sinuses. These include achy pressure type pains across the  forehead, in both cheeks and even making both sides of her upper teeth  and jaws hurt. She says this is a distinctly different type of a  headache than the severe headaches that she feels are migraine  headaches. Of course, Dr. Clarisse Gouge tells her there is no such thing as a  sinus headache and that all of these headaches are due to migraines.  She has been tried on various medications over the years including  Imitrex, which sometimes helps and sometimes does not. She was recently  tried on daily prophylactic therapy with Topamax. However, she had to  stop taking it after about two weeks due to severe side effects  including poor concentration and sedation and making me crazy. He has  also given her ketoprofen and Maxalt recently to try on an as needed  basis, but she has not gotten around to trying these yet. At any event,  because she is convinced that her sinuses are the basis of most of her  headaches, she has set herself up an appointment with an ear, nose and  throat physician in [place] next week. Interestingly, at some point  during her workup for headaches over the past few years, someone  obtained an MRI scan of her head that showed some scattered lesions on  the brain that at first were thought to be possibly indicating multiple  sclerosis or other demyelinating disease. Eventually, these were felt to  simply reflect chronic changes due to her migraine headaches per Dr.  Clarisse Gouge.   Other than her headaches, she basically has no other neurologic symptoms  elsewhere in her body at all fortunately.   Another ongoing problem is chronic right lower quadrant abdominal pain.  This has been going on for about the past year and eventually led up to  her being set up for a colonoscopy per Dr. Arlyce Dice in our [facility]  suite tomorrow April 3.   Her history includes:  1. Total abdominal hysterectomy in 2001, due to endometriosis.  2. She then had a left salpingo-oophorectomy performed due to ovarian      cysts. She still has a right ovary in place, but Dr. Huntley Dec, who is      her gynecologist, feels that this has nothing to do with her      chronic right lower quadrant abdominal pain. In fact, he suggested      that she have a colonoscopy to investigate GI sources of her pain.  3. She is status post an appendectomy many years ago.   Other past medical history includes:  1. Frequent kidney stones.  2. She has acid reflux disease and was recently started on Protonix      for that.  3. She has degenerative arthritis in both of her knees.  4. She has had a tonsillectomy and adenoidectomy as a young child.  5. She struggled with obesity throughout her entire life. However, the      past year and a half she has been working hard with diet and      exercise and is proud to tell me that she has dropped her  weight      from 305 pounds one and a half years ago to her current weight of      224.   REVIEW OF SYSTEMS:  Is otherwise positive for numerous stressors in her  life. She admits to being somewhat depressed and very stressed out  especially over the past few months. She says that her sister is a drug  addict and because of her inability to raise her two young children,  Chalese and her husband have taken in her two young nieces to raise in  their home in addition to her own two children ages 7 years and 10  years. The recent addition of two young children in her home no more  than about six weeks ago has been a tremendous source of turmoil. She  gets little sleep and understandably she feels this has been a big  contributor to her headache problem.   ALLERGIES:  None.   CURRENT MEDICATIONS:  1. Protonix 40 mg daily.  2. Ketoprofen to use on an as needed basis.  3. Maxalt to use on an as needed basis.   HABITS:  She does not use alcohol. She smoked cigarettes at some point  in the past, but has stopped for years.   SOCIAL HISTORY:  She is married. She is a Futures trader.   FAMILY HISTORY:  Is remarkable for high cholesterol, hypertension, and  diabetes.   OBJECTIVE:  Height is 5 feet, 9 inches. Weight is 224, blood pressure  124/78, pulse 72 and regular.  In general, she is quite overweight. She appears to be quite anxious  today and gets tearful at times during her interview. She is quite  appropriate however and has good eye contact. She is neatly dressed and  has an infant niece with her in the room today.   ASSESSMENT/PLAN:  1. Chronic headaches. I think these are probably multi-factorial      including tension headaches as well as migraine headaches. There      may be a sinus component to this as well. She will keep her ear,      nose and throat appointment next week and have these records sent     to me. She will continue to followup with Dr. Clarisse Gouge as well.  2. Right  lower quadrant  pain. From her history, I suspect this may be      due to adhesions from her history of endometriosis and numerous      abdominal surgeries. However, she is scheduled for colonoscopy      tomorrow and will see what this shows.  3. Anxiety. I spent along time talking to [facility] about how this is      contributing to her a lot of      her problems and how I think it would be important to get on      treatment for it. She agreed and I gave her samples to begin      Lexapro 10 mg once daily and I will see her back for followup visit      in three to four weeks.     Tera Mater. Clent Ridges, MD  Electronically Signed    SAF/MedQ  DD: 09/01/2006  DT: 09/01/2006  Job #: [id number]

## 2010-10-17 NOTE — Assessment & Plan Note (Signed)
[facility]                         GASTROENTEROLOGY OFFICE NOTE   NAME:[name], [name]                     MRN:          [id number]  DATE:08/30/2006                            DOB:          1975/08/21    REASON FOR CONSULTATION:  Abdominal pain.   In brief, [name] is a pleasant 35 year old white female referred  through the physicians from Urgent Care for evaluation.  She has been  referred by Urgent Care physicians and Dr. Henderson Cloud for evaluation.  She  has been complaining of severe right lower quadrant pain.  She has a  history of ovarian cyst, though apparently recent ultrasound did not  demonstrate any abnormalities in the right lower quadrant.  She  underwent a laparoscopy several months ago which demonstrated adhesions  between her bowel and stomach.  She has had a left oophorectomy.  Concurrent with her pain, she reports a significant change in bowel  habits.  She formerly would typically have 4-5 loose stools a day.  She  is now having stools approximately every other day which are hard and of  decreased caliber.  She does note improvement in her lower abdominal  pain with a bowel movement.  She states that her pain also radiates  across her lower back and is relieved with bowel movements.  There is no  history of melena or hematochezia.   PAST MEDICAL HISTORY:  Pertinent for:  1. Kidney stones and chronic headaches.  2. She is status post hysterectomy.   FAMILY HISTORY:  Pertinent for diabetes in both parents.   MEDICATIONS:  She is on no medications.   SHE HAS NO ALLERGIES.   She neither smokes nor drinks.  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   REVIEW OF SYSTEMS:  Is positive for feet swelling, insomnia, back pain  and fatigue.  She also claims to have frequent indigestion and chest  discomfort that is relieved with eructation.   PHYSICAL EXAMINATION:  Pulse 72, blood pressure 118/72, weight  227.  HEENT:  EOMI. PERRLA. Sclerae are anicteric.  Conjunctivae are pink.  NECK:  Supple without thyromegaly, adenopathy or carotid bruits.  CHEST:  Clear to auscultation and percussion without adventitious  sounds.  CARDIAC:  Regular rhythm; normal S1 S2.  There are no murmurs, gallops  or rubs.  ABDOMEN:  She has right lower quadrant tenderness without guarding and  rebound.  There are no abdominal masses or organomegaly.  EXTREMITIES:  Full range of motion.  No cyanosis, clubbing or edema.  RECTAL:  There are no masses.  Stool is Hemoccult negative.   IMPRESSION:  1. Right lower quadrant pain coincident with change in bowel habits.      I believe her pain is related to constipation.  The etiology of      constipation has to be determined.  Possibilities include      structural lesions in the colon and small bowel including      inflammatory bowel disease and, less likely, neoplasm.  2. Probable gastroesophageal reflux disease.   RECOMMENDATION:  1. Colonoscopy.  If negative,  I will attempt to examine the terminal      ileum.  2. Trial of Protonix 40 mg a day.     Barbette Hair. Arlyce Dice, MD,FACG  Electronically Signed    RDK/MedQ  DD: 08/30/2006  DT: 08/30/2006  Job #: [id number]   cc:   Jeannett Senior A. Clent Ridges, MD  Guy Sandifer Henderson Cloud, M.D.

## 2010-10-17 NOTE — H&P (Signed)
NAMEHEND, [name]              ACCOUNT NO.:  [id number]   MEDICAL RECORD NO.:  [id number]          PATIENT TYPE:  AMB   LOCATION:                                 FACILITY:   PHYSICIAN:  Guy Sandifer. Henderson Cloud, M.D. DATE OF BIRTH:  [birth year]   DATE OF ADMISSION:  03/04/2005  DATE OF DISCHARGE:  03/04/2005                                HISTORY & PHYSICAL   CHIEF COMPLAINT:  Pelvic pain.   HISTORY OF PRESENT ILLNESS:  This patient is a 35 year old married white  female, G2, P2, status post total vaginal hysterectomy and subsequent left  salpingo-oophorectomy, with a history of pelvic adhesions and endometriosis,  who has recurrent right lower quadrant pain.  The pain is now basically  generalized to the bilateral lower quadrants.  Ultrasound on February 04, 2005, revealed a 2.3 cm cyst on the remaining right ovary with a question of  a daughter cyst.  After discussion of the options, she is being admitted for  laparoscopy, possible ovarian cystectomy, possible right salpingo-  oophorectomy.  She would like to retain a portion of the ovary if possible.  She understands the risks of surgery and the possibility of recurrent pain,  which had been reviewed preoperatively.   PAST MEDICAL HISTORY:  History of mild depression.   PAST SURGICAL HISTORY:  1.  Total vaginal hysterectomy.  2.  Left salpingo-oophorectomy.  3.  Appendectomy.  4.  Tonsillectomy.  5.  Laparoscopy with laser vaporization of endometriosis.  6.  Wrist surgery.   PAST OBSTETRICAL HISTORY:  Vaginal delivery x2.   MEDICATIONS:  Darvocet and Vicodin p.r.n.   No known drug allergies.   SOCIAL HISTORY:  Denies tobacco, alcohol or drug abuse.   FAMILY HISTORY:  Pacemaker in paternal grandfather, chronic hypertension in  father.  Diabetes in mother, father, paternal grandfather, maternal  grandfather.  Leukemia in paternal grandmother.   REVIEW OF SYSTEMS:  NEUROLOGIC:  Denies headache.  CARDIAC:  Denies chest  pain.  PULMONARY:  Denies shortness of breath.  GASTROINTESTINAL:  Denies  recent changes in bowel habits.   PHYSICAL EXAMINATION:  VITAL SIGNS:  Height 5 feet 11 inches, weight 249  pounds.  Blood pressure 110/80.  HEENT:  Without thyromegaly.  CHEST:  Lungs clear to auscultation.  CARDIAC:  Regular rate and rhythm.  BACK:  Without CVA tenderness.  BREASTS:  Without mass, retraction or discharge.  ABDOMEN:  Soft, nontender, without masses.  PELVIC:  Vulva and vagina without lesion.  Adnexa mildly tender without  masses palpated bilaterally.  EXTREMITIES:  Grossly within normal limits.  NEUROLOGIC:  Grossly within normal limits.   ASSESSMENT:  Pelvic pain.   PLAN:  Laparoscopy, possible right ovarian cystectomy with possible right  salpingo-oophorectomy.      Guy Sandifer Henderson Cloud, M.D.  Electronically Signed     JET/MEDQ  D:  03/02/2005  T:  03/02/2005  Job:  [id number]

## 2010-10-17 NOTE — Assessment & Plan Note (Signed)
[facility]                                 ON-CALL NOTE   NAME:[name], ALVIE SPELTZ                     MRN:          [id number]  DATE:09/01/2006                            DOB:          1975/10/01    TIME OF TELEPHONE CALL:  7:05 p.m.   PHONE NUMBER:  [phone].   GASTROENTEROLOGIST:  Barbette Hair. Arlyce Dice, M.D., [facility]   Ms. Lanyon calls this evening complaining of one episode of nausea and  vomiting while trying to complete her MiraLax and Gatorade bowel  preparation for a colonoscopy tomorrow.  She has completed about half of  the preparation and she has not yet had a bowel movement.  She does have  problems with constipation.  I advised her to take a one-hour break and  then restart the preparation.  If she has additional problems with  nausea and vomiting, she will obtain two bottles of magnesium citrate  and take each bottle about a half an hour apart and discontinue the  MiraLax/Gatorade preparation.  She is to take the scheduled Dulcolax  tablets as recommended.  If she has additional problems she will contact  me this evening or call the office tomorrow.     [name]. Russella Dar, MD, [facility]  Electronically Signed    MTS/MedQ  DD: 09/02/2006  DT: 09/02/2006  Job #: [id number]   cc:   Barbette Hair. Arlyce Dice, MD,FACG

## 2010-10-17 NOTE — Letter (Signed)
August 30, 2006     RE:  [name], [name]  MRN:  [id number]  /  DOB:  Jan 15, 1976   Dear Ms. Hirschi:   It is my pleasure to have treated you recently as a new patient in my  office.  I appreciate your confidence and the opportunity to participate  in your care.   Since I do have a busy inpatient endoscopy schedule and office schedule,  my office hours vary weekly.  I am, however, available for emergency  calls every day through my office.  If I cannot promptly meet an urgent  office appointment, another one of our gastroenterologists will be able  to assist you.   My well-trained staff are prepared to help you at all times.  For  emergencies after office hours, a physician from our gastroenterology  section is always available through my 24-hour answering service.   While you are under my care, I encourage discussion of your questions  and concerns, and I will be happy to return your calls as soon as I am  available.   Once again, I welcome you as a new patient and I look forward to a happy  and healthy relationship.    Sincerely,      Barbette Hair. Arlyce Dice, MD,FACG  Electronically Signed    RDK/MedQ  DD: 08/30/2006  DT: 08/30/2006  Job #: [id number]

## 2010-10-17 NOTE — Op Note (Signed)
[facility]  Patient:    [name]                      MRN: [id number] Proc. Date: 07/03/99 Adm. Date:  [id number] Attending:  Donne Hazel                           Operative Report  PREOPERATIVE DIAGNOSES:       1. Left ovarian cyst.                               2. Probable endometriosis, recurrence.                               3. History of pelvic pain.                               4. History of dysmenorrhea.  POSTOPERATIVE DIAGNOSES:      1. Left ovarian cyst.                               2. Probable endometriosis, recurrence.                               3. History of pelvic pain.                               4. History of dysmenorrhea.  OPERATION:                    1. Laparoscopic assisted vaginal hysterectomy.                               2. Left ovarian cystectomy.  SURGEON:                      Willey Blade, M.D.  ASSISTANT:                    Trevor Iha, M.D.  ANESTHESIA:                   General endotracheal  ESTIMATED BLOOD LOSS:         250 cc  COMPLICATIONS:                None.  FINDINGS:                     At time of surgery, a large left ovarian growth was encountered.  This was about a 10 cm sized benign appearing ovarian cyst.  A representative biopsy was obtained from this.  The base of the cyst was thoroughly cauterized with the Bovie cautery.  Also, the uterus was mildly enlarged consistent with adenomyosis. There was one area of endometriosis noted very well organized 1 x 1 cm incised in the inferior right cul-de-sac.  There was also a left tubal cyst 4 x 4 cm in size.  A right tubal cyst 1 cm was also noted and removed.  The appendix was surgically absent.  The upper abdomen was visualized briefly and noted to be normal.  INDICATIONS:  DESCRIPTION OF PROCEDURE:     The patient was taken to the operating room where a general endotracheal anesthetic was administered.  The  patient was placed on the operating table in the dorsal lithotomy position.  The abdomen, perineum and vagina was prepped and draped in the usual sterile fashion with Betadine and sterile drapes.  A Foley catheter was inserted.  A Hulka tenaculum was placed inside the intrauterine cavity.  Next, a small vertical infraumbilical skin incision was made through which a disposable laparoscopic trocar was inserted atraumatically after 2 liters of carbon dioxide gas was insufflated with a Verres needle. This was done atraumatically.  A laparoscope was then inserted with the above noted findings. A secondary incision was made two fingersbreadth above the pubic symphysis in the  midline.  Through this, a 5 mm trocar was then placed atraumatically and a blunt probe placed for pelvic assistance with pelvic surgery.  The left ovarian cyst as removed first.  This was done by cauterizing an approximate 2 x 2 cm segment of the ovary with the bipolar cautery.  The area was then entered and the cyst deflated. A portion of this was removed about 2 cm x 2 cm in size when the vaginal portion of the procedure was then performed.  The base of the left ovarian cyst was then thoroughly cauterized with the Bovie cautery again, this was done at the vaginal portion of the procedure.  Next, attention was turned to laparoscopic assisted vaginal hysterectomy.  This was done in a burn and cut technique.  The uterine ovarian ligaments were cauterized thoroughly bilaterally and the pedicles after  thorough cauterization were dissected down and into the broad ligament hugging ery close to the uterus until just superiorly to the uterine artery pedicles. Attention was then turned to the vaginal portion of this procedure.  The Hulka tenaculum was removed.  A weighted speculum was placed in the posterior fornix of the vagina and the cervix was grasped with a Jacobs tenaculum.  The posterior cul-de-sac was  sharply entered.  Next, the uterosacral ligaments were  clamped bilaterally with curved Heaney clamps.  The pedicles were sharply curetted and suture ligated with a transfixing suture of 0 Vicryl.  A circumferential incision was then made around the anterior portion of the cervix.  The bladder as dissected anteriorly and a bladder blade placed behind the bladder.  Successive  bites were then carried up the body of the uterus using curved Heaney clamps. he vascular pedicles were all sharply created and suture ligated with 0 Vicryl suture. The anterior cul-de-sac was entered and a bladder blade once again placed behind the bladder.  The uterine artery pedicles were isolated bilaterally, clamped with a curved Heaney clamp.  The vascular pedicle was sharply curetted and suture ligated using 0 Vicryl suture.  The operative margins along the uterus were then met and the uterus dissected free.  The vascular pedicles were all suture ligated with  suture of 0 Vicryl.  The uterus was removed.  Good hemostasis was noted of the operative areas and the attention was then turned to closure.  The posterior cul-de-sac was obliterated by transfixing the uterosacral ligaments bilaterally with a transfixing suture of 0 Vicryl.  This was done times two to plicate and obliterate the posterior cul-de-sac.  The uterosacral ligaments were then tied together in the midline and the vagina cuff was reapproximated in the  midline  with multiple of figure-of-eight sutures of 0 Vicryl. Good hemostasis was noted.  The bladder was emptying clear fluid at the end of the procedure. After completion of the closure of the vaginal cuff. All vaginal instruments were removed and the laparoscope once again inserted and the operative areas visualized. At his time, a right tubal cyst was noted 1 x 2 cm in size and this was cauterized thoroughly.  Good hemostasis was noted.  The pelvis was then  thoroughly irrigated with copious amounts of irrigant with good hemostasis achieved as mentioned. About 300 cc of lactated Ringers was left inside the abdomen to hopefully prevent adhesions.  All abdominal instruments were removed and the gas was allowed to  escape from the abdomen.  The infraumbilical skin incision was closed first with 0 Vicryl times two interrupted sutures on the muscle fascia.  The skin was reapproximated with multiple interrupted sutures of 4-0 Vicryl repeat.  The patient did receive Cefotan intraoperatively.  There were no perioperative complications. Blood loss was 250 cc.  This went well.  Final sponge, needle and instrument count was correct times three.  There were o perioperative complications. DD:  07/03/99 TD:  07/03/99 Job: [id number]

## 2010-10-17 NOTE — Discharge Summary (Signed)
[facility]  PatientARNETTE [name]                      MRN: [id number] Adm. Date:  [id number] Disc. Date: [id number] Attending:  Donne Hazel                           Discharge Summary  HISTORY OF PRESENT ILLNESS:   For complete details of history and physical, please see clinic admission history and physical.  Briefly, Mrs. [name] is a 35 year old married female, gravida 2 para 2, admitted for laparoscopy for left ovarian cystectomy for a persistent left ovarian cyst.  She also requests permanent tubal sterilization.  The patient has a long history of severe pelvic pain, severe dysmenorrhea, and dyspareunia.  She requested hysterectomy if endometriosis was  present.  MEDICAL HISTORY:              History of mild depression.  SURGICAL HISTORY:             1. Appendectomy.                               2. Tonsillectomy.  OBSTETRICAL HISTORY:          Normal spontaneous vaginal delivery x 2 at term.  CURRENT MEDICATIONS:          Darvocet and Prozac.  ALLERGIES:                    None known.  PHYSICAL EXAMINATION:         Please see clinic admission history and physical.  ADMITTING DIAGNOSES:          1. Left ovarian cyst.                               2. Requests permanent, voluntary sterilization.                               3. History of severe dysmenorrhea and pelvic pain.                               4. Patient requests hysterectomy.  HOSPITAL COURSE:              The patient was admitted on same day of surgery, July 03, 1999, where she underwent a laparoscopy with left ovarian cystectomy. Endometriosis was present in the right cul-de-sac.  Therefore, due to discussion and preoperative consultation, the patient underwent laparoscopically-assisted vaginal hysterectomy.  This went well, with blood loss 250 cc.                                The patients postoperative course was uneventful.  She was slowly advanced  to a regular diet, which she was tolerating well prior o discharge.  She quickly resumed normal bowel and bladder function.  Her hemoglobin stabilized at 11.0.  She met all goals in the postoperative period.  Is discharged routinely postoperative day #2.  DISCHARGE DIAGNOSES:          1. Left ovarian cyst.  2. Endometriosis.                               3. History of pelvic pain and dysmenorrhea.                               4. Status post laparoscopically-assisted vaginal                                  hysterectomy.  PLAN:                         1. Home.                               2. Tylox #40.                               3. Motrin #30.                               4. Phenergan #20.                               5. Routine postoperative instructions given.                               6. Follow-up in the office in one week for further                                  evaluation. DD:  07/23/99 TD:  07/23/99 Job: [id number]

## 2010-11-24 ENCOUNTER — Encounter (HOSPITAL_COMMUNITY)
Admission: RE | Admit: 2010-11-24 | Discharge: 2010-11-24 | Disposition: A | Discharge status: Home / Self Care | Payer: BC Managed Care – PPO | Source: Ambulatory Visit | Attending: Obstetrics and Gynecology | Admitting: Obstetrics and Gynecology

## 2010-11-24 ENCOUNTER — Other Ambulatory Visit (HOSPITAL_COMMUNITY): Payer: BC Managed Care – PPO

## 2010-11-24 LAB — BASIC METABOLIC PANEL
BUN: 14 mg/dL (ref 6–23)
CO2: 26 mEq/L (ref 19–32)
Calcium: 9.5 mg/dL (ref 8.4–10.5)
Chloride: 101 mEq/L (ref 96–112)
Creatinine, Ser: 0.73 mg/dL (ref 0.50–1.10)
GFR calc Af Amer: >60 mL/min (ref >60)
GFR calc non Af Amer: >60 mL/min (ref >60)
Glucose, Bld: 104 mg/dL — ABNORMAL HIGH (ref 70–99)
Potassium: 3.4 mEq/L — ABNORMAL LOW (ref 3.5–5.1)
Sodium: 138 mEq/L (ref 135–145)

## 2010-11-24 LAB — CBC
HCT: 44.1 % (ref 36.0–46.0)
Hemoglobin: 15.4 g/dL — ABNORMAL HIGH (ref 12.0–15.0)
MCH: 31.1 pg (ref 26.0–34.0)
MCHC: 34.9 g/dL (ref 30.0–36.0)
MCV: 89.1 fL (ref 78.0–100.0)
Platelets: 268 10*3/uL (ref 150–400)
RBC: 4.95 MIL/uL (ref 3.87–5.11)
RDW: 12.7 % (ref 11.5–15.5)
WBC: 12 10*3/uL — ABNORMAL HIGH (ref 4.0–10.5)

## 2010-11-28 NOTE — H&P (Signed)
  [name], [name]              ACCOUNT NO.:  [id number]  MEDICAL RECORD NO.:  [id number]  LOCATION:                                 FACILITY:  PHYSICIAN:  Guy Sandifer. Henderson Cloud, M.D. DATE OF BIRTH:  13-May-1976  DATE OF ADMISSION:  12/04/2010 DATE OF DISCHARGE:                             HISTORY & PHYSICAL   CHIEF COMPLAINT:  Pelvic relaxation.  HISTORY OF PRESENT ILLNESS:  This patient is a 35 year old married white female G2, P2 status post laparoscopically-assisted vaginal hysterectomy who complains of discomfort at the vaginal opening with wiping and discomfort with intercourse.  She had complaints of occasional leaking with coughing and sneezing.  However urodynamics failed to reveal any genuine stress urinary incontinence.  After careful discussion of options, she is being admitted for anterior-posterior colporrhaphy and sacrospinous ligament suspension.  Potential risks, complications, success and failure rates have been carefully discussed with the patient preoperatively.  PAST MEDICAL HISTORY: 1. History of abnormal Pap. 2. History of migraine headaches. 3. History of arthritis. 4. History of pain from a bulging lumbar disk.  PAST SURGICAL HISTORY: 1. Appendectomy. 2. Tonsillectomy. 3. Multiple laparoscopies for lysis of adhesions and ablation of     endometriosis. 4. Laparoscopic left salpingo-oophorectomy. 5. Laparoscopically-assisted vaginal hysterectomy. 6. Removal of the cyst on her wrist.  OBSTETRICAL HISTORY:  Vaginal delivery x2.  MEDICATIONS: 1. Xanax 0.5 mg p.r.n. 2. Vicodin 7.5 mg p.r.n. 3. Flexeril p.r.n. 4. Ibuprofen p.r.n. (the patient instructed to stop this 2 weeks     preoperatively).  ALLERGIES:  No known drug allergies.  SOCIAL HISTORY:  Denies tobacco, alcohol, or drug abuse.  FAMILY HISTORY:  Positive for heart disease, asthma, thyroid disease, gallbladder disease, urinary tract infection, arthritis, diabetes, hypertension, and  thyroid cancer.  REVIEW OF SYSTEMS:  NEURO:  Complains of headache as above.  CARDIAC: Denies chest pain.  PULMONARY:  Denies shortness of breath.  PHYSICAL EXAMINATION:  VITAL SIGNS:  Height 5 feet 9 inches, weight 244 pounds. LUNGS:  Clear to auscultation. HEART:  Regular rate and rhythm. ABDOMEN:  Soft, nontender without masses. PELVIC:  Vulva and vagina without lesion.  The anterior vaginal wall is just inside the vaginal introitus.  Adnexa nontender without masses. RECTAL:  Good rectal sphincter tone and lax rectovaginal septum.  ASSESSMENT:  Pelvic relaxation.  PLAN:  Anterior-posterior colporrhaphy with sacrospinous ligament suspension.     Guy Sandifer Henderson Cloud, M.D.     JET/MEDQ  D:  11/24/2010  T:  11/25/2010  Job:  [id number]  Electronically Signed by Harold Hedge M.D. on 11/28/2010 09:22:25 AM

## 2010-12-04 ENCOUNTER — Telehealth: Payer: Self-pay | Admitting: *Deleted

## 2010-12-04 ENCOUNTER — Ambulatory Visit (HOSPITAL_COMMUNITY)
Admission: RE | Admit: 2010-12-04 | Discharge: 2010-12-05 | Disposition: A | Discharge status: Home / Self Care | Payer: BC Managed Care – PPO | Source: Ambulatory Visit | Attending: Obstetrics and Gynecology | Admitting: Obstetrics and Gynecology

## 2010-12-04 DIAGNOSIS — N993 Prolapse of vaginal vault after hysterectomy: Secondary | ICD-10-CM | POA: Insufficient documentation

## 2010-12-04 DIAGNOSIS — Z01812 Encounter for preprocedural laboratory examination: Secondary | ICD-10-CM | POA: Insufficient documentation

## 2010-12-04 DIAGNOSIS — Z01818 Encounter for other preprocedural examination: Secondary | ICD-10-CM | POA: Insufficient documentation

## 2010-12-04 LAB — COMPREHENSIVE METABOLIC PANEL
ALT: 21 U/L (ref 0–35)
AST: 19 U/L (ref 0–37)
Albumin: 3.9 g/dL (ref 3.5–5.2)
Alkaline Phosphatase: 55 U/L (ref 39–117)
BUN: 10 mg/dL (ref 6–23)
CO2: 26 mEq/L (ref 19–32)
Calcium: 9.3 mg/dL (ref 8.4–10.5)
Chloride: 104 mEq/L (ref 96–112)
Creatinine, Ser: 0.67 mg/dL (ref 0.50–1.10)
GFR calc Af Amer: >60 mL/min (ref >60)
GFR calc non Af Amer: >60 mL/min (ref >60)
Glucose, Bld: 93 mg/dL (ref 70–99)
Potassium: 3.8 mEq/L (ref 3.5–5.1)
Sodium: 140 mEq/L (ref 135–145)
Total Bilirubin: 0.3 mg/dL (ref 0.3–1.2)
Total Protein: 7.1 g/dL (ref 6.0–8.3)

## 2010-12-04 NOTE — Telephone Encounter (Signed)
Refill on hydrocodone/apap 7.5/500 # 60 Last filled 10/30/2010 LOV 10/03/10

## 2010-12-05 ENCOUNTER — Telehealth: Payer: Self-pay | Admitting: Family Medicine

## 2010-12-05 LAB — CBC
HCT: 36.2 % (ref 36.0–46.0)
Hemoglobin: 12 g/dL (ref 12.0–15.0)
MCH: 30.5 pg (ref 26.0–34.0)
MCHC: 33.1 g/dL (ref 30.0–36.0)
MCV: 91.9 fL (ref 78.0–100.0)
Platelets: 263 10*3/uL (ref 150–400)
RBC: 3.94 MIL/uL (ref 3.87–5.11)
RDW: 12.5 % (ref 11.5–15.5)
WBC: 13 10*3/uL — ABNORMAL HIGH (ref 4.0–10.5)

## 2010-12-05 MED ORDER — HYDROCODONE-ACETAMINOPHEN 7.5-500 MG PO TABS: 1.0000 | ORAL_TABLET | Freq: Four times a day (QID) | ORAL | Status: DC | PRN

## 2010-12-05 NOTE — Telephone Encounter (Signed)
Call in #60 with 5 rf 

## 2010-12-05 NOTE — Telephone Encounter (Signed)
I voided the script, [facility].

## 2010-12-06 ENCOUNTER — Inpatient Hospital Stay (HOSPITAL_COMMUNITY)
Admission: AD | Admit: 2010-12-06 | Discharge: 2010-12-06 | Disposition: A | Discharge status: Home / Self Care | Payer: BC Managed Care – PPO | Source: Ambulatory Visit | Attending: Obstetrics and Gynecology | Admitting: Obstetrics and Gynecology

## 2010-12-06 DIAGNOSIS — N9989 Other postprocedural complications and disorders of genitourinary system: Secondary | ICD-10-CM | POA: Insufficient documentation

## 2010-12-06 DIAGNOSIS — Y838 Other surgical procedures as the cause of abnormal reaction of the patient, or of later complication, without mention of misadventure at the time of the procedure: Secondary | ICD-10-CM | POA: Insufficient documentation

## 2010-12-06 DIAGNOSIS — IMO0002 Reserved for concepts with insufficient information to code with codable children: Secondary | ICD-10-CM

## 2010-12-06 DIAGNOSIS — R339 Retention of urine, unspecified: Secondary | ICD-10-CM

## 2010-12-10 NOTE — Op Note (Signed)
[name], [name]              ACCOUNT NO.:  [id number]  MEDICAL RECORD NO.:  [id number]  LOCATION:  9304                          FACILITY:  [facility]  PHYSICIAN:  Guy Sandifer. Henderson Cloud, M.D. DATE OF BIRTH:  [birth year]  DATE OF PROCEDURE:  12/04/2010 DATE OF DISCHARGE:                              OPERATIVE REPORT   PREOPERATIVE DIAGNOSIS:  Pelvic relaxation.  POSTOPERATIVE DIAGNOSIS:  Pelvic relaxation.  PROCEDURES:  Anterior colporrhaphy, posterior colporrhaphy, and sacrospinous ligament suspension.  SURGEON:  Guy Sandifer. Henderson Cloud, MD  ANESTHESIA:  General with endotracheal intubation.  ESTIMATED BLOOD LOSS:  Minimal.  SPECIMENS:  None.  INDICATIONS AND CONSENT:  The patient is a 35 year old married white female G2, P2 status post LAVH who has symptomatic pelvic relaxation. Details are dictated in history and physical.  Anterior and posterior colporrhaphy, sacrospinous ligament suspension has been discussed preoperatively.  Potential risks and complications were discussed preoperatively including but not limited to infection, organ damage, bleeding requiring transfusion of blood products with HIV and hepatitis acquisition, DVT, PE, pneumonia, fistula formation, recurrent pelvic relaxation, postoperative irritative voiding symptoms or stress incontinence, pelvic pain, painful intercourse.  All questions have been answered and consent is signed on the chart.  PROCEDURE IN DETAILS:  The patient was taken to the operating room where she was identified, placed in dorsal supine position and general anesthesia was induced via endotracheal intubation.  She was placed in dorsal lithotomy position.  Time-out was undertaken.  She was prepped, bladder straight catheterized and draped in a sterile fashion.  Weighted speculum was placed.  The vaginal cuff was identified and held at each apex with Allis clamps.  The anterior vaginal mucosa was injected submucosally with 0.5% lidocaine with  1:200,00 epinephrine.  An inverted T-shaped incision was made in the mucosa and the vaginal mucosa was incised in the midline to approximately 1-2 cm below the level of the urethral meatus.  Mucosa was dissected sharply and bluntly bilaterally totally freeing up the base of the bladder.  The vesicovaginal fascia was reapproximated in the midline with interrupted 0 Monocryl suture. This gives good support anteriorly.  Excess mucosa was trimmed and the anterior vaginal mucosa was closed in running fashion with a 2-0 Monocryl suture.  The posterior vaginal mucosa was injected with a same solution of local with epinephrine.  A small diamond-shaped wedge of tissue was removed from the posterior peritoneal body.  The posterior vaginal mucosa was then dissected in the midline from the underlying rectum which was also dissected out bilaterally sharply and bluntly. Dissection was carried out to the right ischial spine and the sacrospinous ligament was identified well.  A 0 Vicryl suture was then placed with a Capio needle driver at least 1 fingerbreadth medial to the ischial spine through the sacrospinous ligament.  Then using a free needle for the suture, it was attached to the backside of the vaginal mucosa just to the right of midline.  It was then held.  The rectovaginal fascia was then reapproximated in the midline with 0 Monocryl figure-of-eights.  Excess vaginal mucosa was trimmed and the posterior vaginal mucosa was closed in a running locking fashion with 2- 0 Monocryl suture for about half the  length of closure.  The sacrospinous suture was then tied down which does a good job of elevating the vaginal cuff without deviating the axis of the vagina. Remainder the vaginal mucosa was trimmed.  A single 2-0 Vicryl suture was used to reapproximate the perineal body and then the 2-0 running Monocryl suture was used to close the remainder of the skin over the perineal body in a standard  episiotomy-type fashion.  Vaginal packing with Estrace cream was then placed in the vagina.  A Foley catheter was placed in the bladder and clear urine was noted.  All counts were correct.  The patient was awakened, taken to recovery room in stable condition.     Guy Sandifer Henderson Cloud, M.D.     JET/MEDQ  D:  12/04/2010  T:  12/04/2010  Job:  [id number]  Electronically Signed by Harold Hedge M.D. on 12/10/2010 08:03:22 AM

## 2010-12-11 ENCOUNTER — Inpatient Hospital Stay (HOSPITAL_COMMUNITY)
Admission: AD | Admit: 2010-12-11 | Discharge: 2010-12-12 | Disposition: A | Discharge status: Home / Self Care | Payer: BC Managed Care – PPO | Source: Ambulatory Visit | Attending: Obstetrics and Gynecology | Admitting: Obstetrics and Gynecology

## 2010-12-11 DIAGNOSIS — R339 Retention of urine, unspecified: Secondary | ICD-10-CM | POA: Insufficient documentation

## 2010-12-11 DIAGNOSIS — Z87891 Personal history of nicotine dependence: Secondary | ICD-10-CM | POA: Insufficient documentation

## 2010-12-11 MED ORDER — CIPROFLOXACIN HCL 250 MG PO TABS: 250.0000 mg | ORAL_TABLET | Freq: Two times a day (BID) | ORAL | Status: AC

## 2010-12-11 NOTE — Progress Notes (Signed)
Pt reports she had surgery last week, unable to urinate and had indwelling cath put in on 07/06 , that cath was removed yesterday. Today has been unable to void today, feels full and has pain.denies fever

## 2010-12-11 NOTE — Consult Note (Signed)
Discussed patient with Dr. Henderson Cloud. Requested cath urine culture be sent. Foley to remain in place. Pt to return to MAU on Saturday for removal and voiding trial. Rx Cipro 250mg  BID x 7 days. Page Henderson Cloud on Saturday when pt returns to MAU.

## 2010-12-11 NOTE — ED Provider Notes (Signed)
History   Chief Complaint:  Post-op Problem Pt had Anterior colporrhaphy, posterior colporrhaphy, and sacrospinous ligament suspension on 12/04/10. Patient statess she had surgery of 5th and cath was taken out on hte 6th, pt kept the cath from 6th until 1Oth then after removal on the 11th and voiding all yesterday,pt states she has been voiding small amounts of urine trickles today.Pt states she feel s and the urge to pass urine  Greg P Kimery is  35 y.o. No obstetric history on file..  No LMP recorded..  Her pregnancy status is negative.  She presents complaining of Post-op Problem . Onset is described as ongoing since 12/05/10.   OB History    Grav Para Term Preterm Abortions TAB SAB Ect Mult Living                   Past Medical History  Diagnosis Date  . Allergy   . Headache   . GERD (gastroesophageal reflux disease)   . Polycystic ovaries   . B12 deficiency   . Peripheral neuropathy   . Fibromyalgia   . Depression     Past Surgical History  Procedure Date  . Appendectomy   . Abdominal hysterectomy   . Tonsillectomy   . Sinusotomy 12/14/06    Family History  Problem Relation Age of Onset  . Fibromyalgia Mother     History  Substance Use Topics  . Smoking status: Former Smoker    Quit date: 06/01/1998  . Smokeless tobacco: Not on file  . Alcohol Use: Not on file    Allergies:  Allergies  Allergen Reactions  . Oxycodone Base Nausea And Vomiting    No prescriptions prior to admission    Review of Systems - History obtained from the patient General ROS: negative Genito-Urinary ROS: + urinary retention  Physical Exam   Blood pressure 133/88, pulse 101, temperature 98.3 F (36.8 C), temperature source Oral, resp. rate 20, height 5\' 9"  (1.753 m), weight 243 lb (110.224 kg).  General: General appearance - alert, well appearing, and in no distress, oriented to person, place, and time and overweight Mental status - alert, oriented to person, place, and  time, normal mood, behavior, speech, dress, motor activity, and thought processes Abdomen - soft, nontender, nondistended, no masses or organomegaly no CVA tenderness Back exam - full range of motion, no tenderness, palpable spasm or pain on motion Skin - normal coloration and turgor, no rashes, no suspicious skin lesions noted Focused Gynecological Exam: examination not indicated  Labs: No results found for this or any previous visit (from the past 24 hour(s)). Urine C&S: pending  Assessment: Patient Active Problem List  Diagnoses  . VITAMIN B12 DEFICIENCY  . MORBID OBESITY  . ANXIETY DISORDER, GENERALIZED  . DEPRESSION  . PERIPHERAL NEUROPATHY  . CERUMEN IMPACTION  . SINUSITIS, ACUTE FRONTAL  . ACUTE BRONCHITIS  . ALLERGIC RHINITIS  . GERD  . CONSTIPATION  . ACUTE CYSTITIS  . UTI  . ACNE VULGARIS  . DEGENERATIVE JOINT DISEASE, KNEES, BILATERAL  . FIBROMYALGIA  . INSOMNIA  . WEAKNESS  . LEG EDEMA  . HEADACHE  . DYSURIA  . RENAL CALCULUS, HX OF  . Urinary retention     Plan: Foley to remain; pt to return to MAU on Saturday 12/13/10 for d/c and voiding trial (Deairra Halleck to be called) Rx Cipro 250mg  po BID x 7 days  SHORES,SUZANNE E. 12/12/2010, 2:23 AM

## 2010-12-13 ENCOUNTER — Encounter (HOSPITAL_COMMUNITY): Payer: Self-pay | Admitting: *Deleted

## 2010-12-13 ENCOUNTER — Inpatient Hospital Stay (HOSPITAL_COMMUNITY)
Admission: AD | Admit: 2010-12-13 | Discharge: 2010-12-13 | Disposition: A | Discharge status: Home / Self Care | Payer: BC Managed Care – PPO | Source: Ambulatory Visit | Attending: Obstetrics and Gynecology | Admitting: Obstetrics and Gynecology

## 2010-12-13 DIAGNOSIS — Z466 Encounter for fitting and adjustment of urinary device: Secondary | ICD-10-CM | POA: Insufficient documentation

## 2010-12-13 LAB — URINE CULTURE
Colony Count: >=100000
Culture  Setup Time: 201207130520
Special Requests: NORMAL

## 2010-12-13 NOTE — Progress Notes (Signed)
Notified pt is here for cath removal orders received.

## 2010-12-13 NOTE — Progress Notes (Signed)
Had had bladder tacked for vaginal prolapse has urinary catheter here today for removal.

## 2010-12-13 NOTE — Progress Notes (Signed)
Pt here to have foley cath removed after surgery. Has had it placed 3 times

## 2010-12-13 NOTE — ED Provider Notes (Signed)
Patient here for foley catheter removal. Catheter removed by RN. Patient able to void after removal and states she feels much better. Will d/c home to follow up in the office. If she has recurrent urinary retention she will return.  [place], [place] 12/13/10 1159

## 2010-12-29 ENCOUNTER — Telehealth: Payer: Self-pay | Admitting: Family Medicine

## 2010-12-29 NOTE — Telephone Encounter (Signed)
Refill request for Hydro-Ace, pt last here on 10/03/10 and script last filled on 12/05/10.

## 2010-12-30 ENCOUNTER — Telehealth: Payer: Self-pay

## 2010-12-30 NOTE — Telephone Encounter (Signed)
What is "hydro-ace" ?

## 2010-12-30 NOTE — Telephone Encounter (Signed)
Sorry, its Lortab.

## 2010-12-30 NOTE — Telephone Encounter (Signed)
Faxed refill request from [facility]. Our records show that it was phoned in to [facility] on 12/05/10, however the pharmacy has no record of it being called in. Pharmacist did notify me that an rx was filled for the pt for Vicodin 7.5/750 mg by Dr[facility] Henderson [facility] [address].   Spoke with pt. She states that the rx from Dr. Henderson Cloud was due to an emergency surgery that she had and was only a 15 day supply. She says that she does need the new requested rx. Please advise

## 2010-12-31 MED ORDER — HYDROCODONE-ACETAMINOPHEN 7.5-500 MG PO TABS: 1.0000 | ORAL_TABLET | Freq: Four times a day (QID) | ORAL | Status: DC | PRN

## 2010-12-31 NOTE — Telephone Encounter (Signed)
Per Dr. Clent Ridges, disregard his response from this message. Please view other phone note

## 2010-12-31 NOTE — Telephone Encounter (Signed)
Please ignore my answer on the other message. Call in #60 with 2 rf

## 2010-12-31 NOTE — Telephone Encounter (Signed)
done

## 2010-12-31 NOTE — Telephone Encounter (Signed)
NO refills, she is using too much of this. We gave her #60 with 5 rf just 3 weeks ago

## 2011-01-25 ENCOUNTER — Emergency Department (HOSPITAL_COMMUNITY)
Admission: EM | Admit: 2011-01-25 | Discharge: 2011-01-25 | Disposition: A | Discharge status: Home / Self Care | Payer: BC Managed Care – PPO | Attending: Emergency Medicine | Admitting: Emergency Medicine

## 2011-01-25 DIAGNOSIS — M79609 Pain in unspecified limb: Secondary | ICD-10-CM | POA: Insufficient documentation

## 2011-01-25 DIAGNOSIS — IMO0002 Reserved for concepts with insufficient information to code with codable children: Secondary | ICD-10-CM | POA: Insufficient documentation

## 2011-02-26 ENCOUNTER — Other Ambulatory Visit: Payer: Self-pay | Admitting: Family Medicine

## 2011-02-26 LAB — POCT URINALYSIS DIP (DEVICE)
Bilirubin Urine: NEGATIVE
Glucose, UA: NEGATIVE
Hgb urine dipstick: NEGATIVE
Ketones, ur: NEGATIVE
Nitrite: NEGATIVE
Operator id: 282151
Protein, ur: NEGATIVE
Specific Gravity, Urine: 1.025
Urobilinogen, UA: 0.2
pH: 6

## 2011-02-26 LAB — POCT PREGNANCY, URINE
Operator id: 282151
Preg Test, Ur: NEGATIVE

## 2011-02-26 LAB — WET PREP, GENITAL
Clue Cells Wet Prep HPF POC: NONE SEEN
Trich, Wet Prep: NONE SEEN
Yeast Wet Prep HPF POC: NONE SEEN

## 2011-02-26 NOTE — Telephone Encounter (Signed)
Pt is requesting additional refills of vicodin due to bulging/broken disc in back. Pt has neurosugeon Dr Phoebe Perch.[facility] [phone]

## 2011-02-26 NOTE — Telephone Encounter (Signed)
Last filled on 02/09/11 #60

## 2011-02-27 NOTE — Telephone Encounter (Signed)
Spoke with pt

## 2011-02-27 NOTE — Telephone Encounter (Signed)
Left a message for pt to return call 

## 2011-02-27 NOTE — Telephone Encounter (Signed)
NO it is too soon for refills. She should be getting these from Dr. Phoebe Perch anyway

## 2011-03-04 ENCOUNTER — Other Ambulatory Visit: Payer: Self-pay

## 2011-03-04 NOTE — Telephone Encounter (Signed)
Rx request for hydrocoone-acetaminophen 7.5-500 #60. Pt last seen 10/03/10. Pls advise.

## 2011-03-06 MED ORDER — HYDROCODONE-ACETAMINOPHEN 7.5-500 MG PO TABS: 1.0000 | ORAL_TABLET | Freq: Four times a day (QID) | ORAL | Status: DC | PRN

## 2011-03-06 NOTE — Telephone Encounter (Signed)
Call in #60 with 2 rf 

## 2011-03-06 NOTE — Telephone Encounter (Signed)
Script called in

## 2011-03-06 NOTE — Telephone Encounter (Signed)
Addended by: Aniceto Boss A on: 03/06/2011 11:04 AM   Modules accepted: Orders

## 2011-05-13 ENCOUNTER — Other Ambulatory Visit: Payer: Self-pay

## 2011-05-13 NOTE — Telephone Encounter (Signed)
Last OV 10/03/10. Last fill date 04/14/11

## 2011-05-14 MED ORDER — HYDROCODONE-ACETAMINOPHEN 7.5-500 MG PO TABS: 1.0000 | ORAL_TABLET | Freq: Four times a day (QID) | ORAL | Status: DC | PRN

## 2011-05-14 NOTE — Telephone Encounter (Signed)
Script called in

## 2011-05-14 NOTE — Telephone Encounter (Signed)
Call in #60 with 2 rf 

## 2011-05-23 ENCOUNTER — Other Ambulatory Visit: Payer: Self-pay | Admitting: Family Medicine

## 2011-07-09 ENCOUNTER — Other Ambulatory Visit (HOSPITAL_COMMUNITY): Payer: Self-pay | Admitting: Neurosurgery

## 2011-07-09 DIAGNOSIS — M5106 Intervertebral disc disorders with myelopathy, lumbar region: Secondary | ICD-10-CM

## 2011-07-10 ENCOUNTER — Other Ambulatory Visit: Payer: Self-pay

## 2011-07-10 NOTE — Telephone Encounter (Signed)
DO NOT refill. It is too soon. She is going through too much narcotics the past 6 months. Have her come in to see me about this

## 2011-07-10 NOTE — Telephone Encounter (Signed)
Left voice message, pt needs to schedule a office visit.

## 2011-07-10 NOTE — Telephone Encounter (Signed)
Fax refill request from [facility] for lortab Last seen 09/2010  Last written 05/14/11  #[phone] Please advise

## 2011-07-13 ENCOUNTER — Inpatient Hospital Stay (HOSPITAL_COMMUNITY): Admission: RE | Admit: 2011-07-13 | Payer: BC Managed Care – PPO | Source: Ambulatory Visit

## 2011-07-22 ENCOUNTER — Other Ambulatory Visit: Payer: Self-pay | Admitting: Family Medicine

## 2011-07-22 ENCOUNTER — Other Ambulatory Visit (HOSPITAL_COMMUNITY): Payer: BC Managed Care – PPO

## 2011-07-29 ENCOUNTER — Encounter: Payer: Self-pay | Admitting: Family Medicine

## 2011-07-29 ENCOUNTER — Ambulatory Visit (INDEPENDENT_AMBULATORY_CARE_PROVIDER_SITE_OTHER): Payer: BC Managed Care – PPO | Admitting: Family Medicine

## 2011-07-29 ENCOUNTER — Ambulatory Visit (HOSPITAL_COMMUNITY): Admission: RE | Admit: 2011-07-29 | Payer: BC Managed Care – PPO | Source: Ambulatory Visit

## 2011-07-29 VITALS — BP 120/76 | HR 108 | Temp 98.1°F | Wt 257.0 lb

## 2011-07-29 DIAGNOSIS — M545 Low back pain, unspecified: Secondary | ICD-10-CM

## 2011-07-29 DIAGNOSIS — R519 Headache, unspecified: Secondary | ICD-10-CM

## 2011-07-29 DIAGNOSIS — R51 Headache: Secondary | ICD-10-CM

## 2011-07-29 MED ORDER — HYDROCODONE-ACETAMINOPHEN 7.5-500 MG PO TABS: 1.0000 | ORAL_TABLET | Freq: Four times a day (QID) | ORAL | Status: DC | PRN

## 2011-07-29 MED ORDER — PROMETHAZINE HCL 25 MG PO TABS: 25.0000 mg | ORAL_TABLET | Freq: Four times a day (QID) | ORAL | Status: DC | PRN

## 2011-07-29 MED ORDER — HYDROCHLOROTHIAZIDE 12.5 MG PO CAPS: 12.5000 mg | ORAL_CAPSULE | Freq: Every day | ORAL | Status: DC | PRN

## 2011-07-29 NOTE — Progress Notes (Signed)
  Subjective:    Patient ID: [name], female    DOB: 1975/06/10, 36 y.o.   MRN: [id number]  HPI Here for med refills and for advice. On 04-02-11 she had a laminectomy and discectomy per Dr. Phoebe Perch, and she has been dealing with lower back pain for months. The pain is better after the surgery, but she still has pain in the back and down the left leg. She has been using the Vicodin we prescribed for her for this back pain and also for the tension HAs we have been treating her for. Thus she is going through it much mor quickly than before. She has not been getting any pain meds through Dr. Phoebe Perch at all. She is due for another lumbar MRI today and will see him next week.    Review of Systems  Constitutional: Negative.   Musculoskeletal: Positive for back pain.  Neurological: Positive for headaches.       Objective:   Physical Exam  Constitutional: She is oriented to person, place, and time. She appears well-developed and well-nourished.  Eyes: Pupils are equal, round, and reactive to light.  Neurological: She is alert and oriented to person, place, and time. No cranial nerve deficit.          Assessment & Plan:  We refilled her pain meds. I asked her to be sure that Dr. Phoebe Perch keeps me informed of her procedures and office visits.

## 2011-08-27 ENCOUNTER — Telehealth: Payer: Self-pay | Admitting: Family Medicine

## 2011-08-27 MED ORDER — PHENTERMINE HCL 37.5 MG PO CAPS: 37.5000 mg | ORAL_CAPSULE | ORAL | Status: DC

## 2011-08-27 NOTE — Telephone Encounter (Signed)
Call in #[phone] with 5 rf 

## 2011-08-27 NOTE — Telephone Encounter (Signed)
Script called in

## 2011-08-27 NOTE — Telephone Encounter (Signed)
Refill request for Phentermine 37.5 mg take 1 po qd and pt last here on 07/29/11.

## 2011-09-25 ENCOUNTER — Other Ambulatory Visit (HOSPITAL_COMMUNITY): Payer: Self-pay | Admitting: Neurosurgery

## 2011-09-25 DIAGNOSIS — M5106 Intervertebral disc disorders with myelopathy, lumbar region: Secondary | ICD-10-CM

## 2011-09-30 ENCOUNTER — Ambulatory Visit (HOSPITAL_COMMUNITY)
Admission: RE | Admit: 2011-09-30 | Discharge: 2011-09-30 | Disposition: A | Discharge status: Home / Self Care | Payer: Managed Care, Other (non HMO) | Source: Ambulatory Visit | Attending: Neurosurgery | Admitting: Neurosurgery

## 2011-09-30 DIAGNOSIS — M5106 Intervertebral disc disorders with myelopathy, lumbar region: Secondary | ICD-10-CM

## 2011-09-30 MED ORDER — GADOBENATE DIMEGLUMINE 529 MG/ML IV SOLN
20.0000 mL | Freq: Once | INTRAVENOUS | Status: AC
  Administered 2011-09-30: 20 mL via INTRAVENOUS

## 2011-09-30 MED ORDER — DIPHENHYDRAMINE HCL 50 MG PO CAPS
ORAL_CAPSULE | ORAL | Status: AC
  Filled 2011-09-30: qty 1

## 2011-09-30 MED ORDER — DIPHENHYDRAMINE HCL 50 MG/ML IJ SOLN
INTRAMUSCULAR | Status: AC
  Filled 2011-09-30: qty 1

## 2011-09-30 MED ORDER — DIPHENHYDRAMINE HCL 50 MG PO CAPS: 50.0000 mg | ORAL_CAPSULE | Freq: Once | ORAL | Status: DC

## 2011-12-07 ENCOUNTER — Telehealth: Payer: Self-pay | Admitting: Family Medicine

## 2011-12-07 NOTE — Telephone Encounter (Signed)
Refill request for Hydrocodon/Acetamnophn 7.5-500 mg take 1 po q6hrs prn and pt last here on 07/29/11.

## 2011-12-08 MED ORDER — HYDROCODONE-ACETAMINOPHEN 7.5-500 MG PO TABS: 1.0000 | ORAL_TABLET | Freq: Four times a day (QID) | ORAL | Status: DC | PRN

## 2011-12-08 NOTE — Telephone Encounter (Signed)
I called in script 

## 2011-12-08 NOTE — Telephone Encounter (Signed)
Call in #60 with 5 rf 

## 2012-01-15 ENCOUNTER — Emergency Department (HOSPITAL_BASED_OUTPATIENT_CLINIC_OR_DEPARTMENT_OTHER): Payer: Managed Care, Other (non HMO)

## 2012-01-15 ENCOUNTER — Emergency Department (HOSPITAL_BASED_OUTPATIENT_CLINIC_OR_DEPARTMENT_OTHER)
Admission: EM | Admit: 2012-01-15 | Discharge: 2012-01-15 | Disposition: A | Discharge status: Home / Self Care | Payer: Managed Care, Other (non HMO) | Attending: Emergency Medicine | Admitting: Emergency Medicine

## 2012-01-15 ENCOUNTER — Encounter (HOSPITAL_BASED_OUTPATIENT_CLINIC_OR_DEPARTMENT_OTHER): Payer: Self-pay | Admitting: Emergency Medicine

## 2012-01-15 DIAGNOSIS — Z9089 Acquired absence of other organs: Secondary | ICD-10-CM | POA: Insufficient documentation

## 2012-01-15 DIAGNOSIS — Z90710 Acquired absence of both cervix and uterus: Secondary | ICD-10-CM | POA: Insufficient documentation

## 2012-01-15 DIAGNOSIS — Z87891 Personal history of nicotine dependence: Secondary | ICD-10-CM | POA: Insufficient documentation

## 2012-01-15 DIAGNOSIS — IMO0001 Reserved for inherently not codable concepts without codable children: Secondary | ICD-10-CM | POA: Insufficient documentation

## 2012-01-15 DIAGNOSIS — F3289 Other specified depressive episodes: Secondary | ICD-10-CM | POA: Insufficient documentation

## 2012-01-15 DIAGNOSIS — IMO0002 Reserved for concepts with insufficient information to code with codable children: Secondary | ICD-10-CM | POA: Insufficient documentation

## 2012-01-15 DIAGNOSIS — F329 Major depressive disorder, single episode, unspecified: Secondary | ICD-10-CM | POA: Insufficient documentation

## 2012-01-15 DIAGNOSIS — M5416 Radiculopathy, lumbar region: Secondary | ICD-10-CM

## 2012-01-15 DIAGNOSIS — K219 Gastro-esophageal reflux disease without esophagitis: Secondary | ICD-10-CM | POA: Insufficient documentation

## 2012-01-15 MED ORDER — OXYCODONE-ACETAMINOPHEN 5-325 MG PO TABS
1.0000 | ORAL_TABLET | Freq: Once | ORAL | Status: AC
  Administered 2012-01-15: 1 via ORAL
  Filled 2012-01-15 (×2): qty 1

## 2012-01-15 MED ORDER — FENTANYL CITRATE 0.05 MG/ML IJ SOLN
200.0000 ug | Freq: Once | INTRAMUSCULAR | Status: AC
  Administered 2012-01-15: 200 ug via INTRAMUSCULAR

## 2012-01-15 MED ORDER — FENTANYL CITRATE 0.05 MG/ML IJ SOLN
200.0000 ug | Freq: Once | INTRAMUSCULAR | Status: DC
  Filled 2012-01-15: qty 4

## 2012-01-15 NOTE — ED Provider Notes (Signed)
History     CSN: [id number]  Arrival date & time 01/15/12  8295   First MD Initiated Contact with Patient 01/15/12 0530      Chief Complaint  Patient presents with  . Leg Pain    (Consider location/radiation/quality/duration/timing/severity/associated sxs/prior treatment) HPI  this is a 36 year old white female with a history of chronic pain and paresthesias in her left lower extremity in about the L3 and L4 dermatomes due to known radiculopathy. She recently took a trip to the beach and the facility where she stayed in vault climbing stairs frequently, which she does not normally do. She also had been sleeping in an uncomfortable bed. Yesterday she developed worsening pain and paresthesias which she feels like her in the entire leg. She states that this does not feel like an exacerbation of her chronic neuropathy and is having difficulty further characterizing it. She states her left leg feels cold. There is no edema. She has not had any injury. There is no motor dysfunction. There is no bowel or bladder changes. There is no saddle anesthesia. The symptoms are made her very anxious, worried that she may have a blood clot. She has taken her normal medications without relief. She also has a history of episodic pallor and her toes which she has never been worked up.  Past Medical History  Diagnosis Date  . Allergy   . Headache   . GERD (gastroesophageal reflux disease)   . Polycystic ovaries   . B12 deficiency   . Peripheral neuropathy   . Fibromyalgia   . Depression     Past Surgical History  Procedure Date  . Appendectomy   . Abdominal hysterectomy   . Tonsillectomy   . Sinusotomy 12/14/06  . Wrist ganglion excision   . Ovarian cyst removal   . Oophorectomy     left overy  . Rectocele repair     Family History  Problem Relation Age of Onset  . Fibromyalgia Mother     History  Substance Use Topics  . Smoking status: Former Smoker    Quit date: 06/01/1998  . Smokeless  tobacco: Never Used  . Alcohol Use: No    OB History    Grav Para Term Preterm Abortions TAB SAB Ect Mult Living   2 2  2  0     2      Review of Systems  All other systems reviewed and are negative.    Allergies  Gadolinium derivatives  Home Medications   Current Outpatient Rx  Name Route Sig Dispense Refill  . GABAPENTIN 300 MG PO CAPS Oral Take 300 mg by mouth 3 (three) times daily.    . CELECOXIB 200 MG PO CAPS Oral Take 200 mg by mouth 2 (two) times daily.    . CYCLOBENZAPRINE HCL 10 MG PO TABS Oral Take 10 mg by mouth every 8 (eight) hours as needed. PATIENT TAKES FOR MUSCLE SPASMS     . FLUTICASONE PROPIONATE 50 MCG/ACT NA SUSP Nasal 2 sprays by Nasal route daily. 16 g 11  . HYDROCHLOROTHIAZIDE 12.5 MG PO CAPS Oral Take 1 capsule (12.5 mg total) by mouth daily as needed (fluid ). swelling 30 capsule 11  . HYDROCODONE-ACETAMINOPHEN 7.5-500 MG PO TABS Oral Take 1 tablet by mouth every 6 (six) hours as needed for pain. 60 tablet 5  . MULTI-VITAMIN GUMMIES PO Oral Take 1 tablet by mouth daily.      Marland Kitchen PANTOPRAZOLE SODIUM 40 MG PO TBEC      .  PHENTERMINE HCL 37.5 MG PO CAPS Oral Take 1 capsule (37.5 mg total) by mouth every morning. 30 capsule 5  . PHENTERMINE HCL 37.5 MG PO CAPS Oral Take 1 capsule (37.5 mg total) by mouth every morning. 30 capsule 5  . PROMETHAZINE HCL 25 MG PO TABS Oral Take 1 tablet (25 mg total) by mouth every 6 (six) hours as needed for nausea. PATIENT TAKES FOR NAUSEA 60 tablet 5    BP 141/104  Pulse 110  Temp 98.5 F (36.9 C) (Oral)  Resp 18  SpO2 100%  LMP 12/13/1999  Physical Exam General: Well-developed, well-nourished female in no acute distress; appearance consistent with age of record HENT: normocephalic, atraumatic Eyes: Normal appearance Neck: supple Heart: regular rate and rhythm Lungs: clear to auscultation bilaterally Abdomen: soft; nondistended Extremities: No deformity; full range of motion; pulses normal; no edema; no calf  tenderness; the skin is of toes, left foot greater than right Neurologic: Awake, alert and oriented; motor function intact in all extremities and symmetric; no facial droop; decreased sensation in the left lower extremity, notable in the L3-L4 dermatomes but most prominent in the L5 dermatome; normal coordination and speech; no saddle anesthesia Skin: Warm and dry Psychiatric: Anxious; careful    ED Course  Procedures (including critical care time)     MDM  7:04 AM Awaiting Doppler ultrasound of the left lower extremity. Dr. Karma Ganja will make disposition.        Hanley Seamen, MD 01/15/12 [phone]

## 2012-01-15 NOTE — ED Notes (Signed)
Reports previous l5 surgery in November of 2012 that caused leg numbness, has chronic pain to left leg, states she was walking up and down steps yesterday sightseeing at beach and felt like pain changed all of the sudden, pt is not use to walking up and down steps, now complaining of numbness and sensation changes to left leg

## 2012-01-21 ENCOUNTER — Other Ambulatory Visit: Payer: Self-pay | Admitting: Family Medicine

## 2012-02-05 ENCOUNTER — Ambulatory Visit: Payer: BC Managed Care – PPO | Admitting: Family Medicine

## 2012-02-08 ENCOUNTER — Ambulatory Visit (INDEPENDENT_AMBULATORY_CARE_PROVIDER_SITE_OTHER): Payer: Managed Care, Other (non HMO) | Admitting: Family Medicine

## 2012-02-08 ENCOUNTER — Encounter: Payer: Self-pay | Admitting: Family Medicine

## 2012-02-08 VITALS — BP 126/90 | HR 102 | Temp 98.4°F | Wt 256.0 lb

## 2012-02-08 DIAGNOSIS — R1013 Epigastric pain: Secondary | ICD-10-CM

## 2012-02-08 MED ORDER — ALPRAZOLAM 0.5 MG PO TABS: 0.5000 mg | ORAL_TABLET | Freq: Three times a day (TID) | ORAL | Status: DC | PRN

## 2012-02-08 MED ORDER — GI COCKTAIL ~~LOC~~: 30.0000 mL | Freq: Three times a day (TID) | ORAL | Status: DC

## 2012-02-08 NOTE — Progress Notes (Signed)
  Subjective:    Patient ID: [name], female    DOB: [birth year], 36 y.o.   MRN: [id number]  HPI Here for several episodes recently of severe sharp epigastric pains that radiate through the back to the right shoulder blade. These cause nausea but she has not vomitied. No fever. No change in BMs. No fevers. Taking Protonix bid.   Review of Systems  Constitutional: Negative.   Respiratory: Negative.   Cardiovascular: Negative.   Gastrointestinal: Positive for nausea and abdominal pain. Negative for vomiting, diarrhea, constipation, blood in stool and abdominal distention.       Objective:   Physical Exam  Constitutional: She appears well-developed and well-nourished.  Cardiovascular: Normal rate, regular rhythm, normal heart sounds and intact distal pulses.   Pulmonary/Chest: Effort normal.  Abdominal: Soft. Bowel sounds are normal. She exhibits no distension and no mass. There is no rebound and no guarding.       Mildly tender in the epigastrium           Assessment & Plan:  Sounds like gallbladder disease so we will set up an [place] soon. Avoid fatty foods.

## 2012-02-11 ENCOUNTER — Other Ambulatory Visit: Payer: Managed Care, Other (non HMO)

## 2012-02-12 ENCOUNTER — Other Ambulatory Visit: Payer: Managed Care, Other (non HMO)

## 2012-02-12 ENCOUNTER — Ambulatory Visit
Admission: RE | Admit: 2012-02-12 | Discharge: 2012-02-12 | Disposition: A | Discharge status: Home / Self Care | Payer: Managed Care, Other (non HMO) | Source: Ambulatory Visit | Attending: Family Medicine | Admitting: Family Medicine

## 2012-02-12 DIAGNOSIS — R1013 Epigastric pain: Secondary | ICD-10-CM

## 2012-02-12 NOTE — Progress Notes (Signed)
Quick Note:  I spoke with pt ______ 

## 2012-04-04 ENCOUNTER — Encounter (HOSPITAL_BASED_OUTPATIENT_CLINIC_OR_DEPARTMENT_OTHER): Payer: Self-pay | Admitting: *Deleted

## 2012-04-04 ENCOUNTER — Emergency Department (HOSPITAL_BASED_OUTPATIENT_CLINIC_OR_DEPARTMENT_OTHER)
Admission: EM | Admit: 2012-04-04 | Discharge: 2012-04-05 | Disposition: A | Discharge status: Home / Self Care | Payer: Managed Care, Other (non HMO) | Attending: Emergency Medicine | Admitting: Emergency Medicine

## 2012-04-04 DIAGNOSIS — IMO0001 Reserved for inherently not codable concepts without codable children: Secondary | ICD-10-CM | POA: Insufficient documentation

## 2012-04-04 DIAGNOSIS — G43909 Migraine, unspecified, not intractable, without status migrainosus: Secondary | ICD-10-CM | POA: Insufficient documentation

## 2012-04-04 DIAGNOSIS — F3289 Other specified depressive episodes: Secondary | ICD-10-CM | POA: Insufficient documentation

## 2012-04-04 DIAGNOSIS — Z79899 Other long term (current) drug therapy: Secondary | ICD-10-CM | POA: Insufficient documentation

## 2012-04-04 DIAGNOSIS — B3731 Acute candidiasis of vulva and vagina: Secondary | ICD-10-CM | POA: Insufficient documentation

## 2012-04-04 DIAGNOSIS — K219 Gastro-esophageal reflux disease without esophagitis: Secondary | ICD-10-CM | POA: Insufficient documentation

## 2012-04-04 DIAGNOSIS — B373 Candidiasis of vulva and vagina: Secondary | ICD-10-CM

## 2012-04-04 DIAGNOSIS — Z87891 Personal history of nicotine dependence: Secondary | ICD-10-CM | POA: Insufficient documentation

## 2012-04-04 DIAGNOSIS — G609 Hereditary and idiopathic neuropathy, unspecified: Secondary | ICD-10-CM | POA: Insufficient documentation

## 2012-04-04 DIAGNOSIS — F329 Major depressive disorder, single episode, unspecified: Secondary | ICD-10-CM | POA: Insufficient documentation

## 2012-04-04 DIAGNOSIS — Z8742 Personal history of other diseases of the female genital tract: Secondary | ICD-10-CM | POA: Insufficient documentation

## 2012-04-04 LAB — URINALYSIS, ROUTINE W REFLEX MICROSCOPIC
Bilirubin Urine: NEGATIVE
Glucose, UA: NEGATIVE mg/dL
Hgb urine dipstick: NEGATIVE
Ketones, ur: NEGATIVE mg/dL
Leukocytes, UA: NEGATIVE
Nitrite: NEGATIVE
Protein, ur: NEGATIVE mg/dL
Specific Gravity, Urine: 1.025 (ref 1.005–1.030)
Urobilinogen, UA: 0.2 mg/dL (ref 0.0–1.0)
pH: 6 (ref 5.0–8.0)

## 2012-04-04 LAB — WET PREP, GENITAL
Clue Cells Wet Prep HPF POC: NONE SEEN
Trich, Wet Prep: NONE SEEN
Yeast Wet Prep HPF POC: NONE SEEN

## 2012-04-04 MED ORDER — HYDROMORPHONE HCL PF 1 MG/ML IJ SOLN
1.0000 mg | Freq: Once | INTRAMUSCULAR | Status: AC
  Administered 2012-04-05: 1 mg via INTRAVENOUS
  Filled 2012-04-04: qty 1

## 2012-04-04 MED ORDER — METOCLOPRAMIDE HCL 5 MG/ML IJ SOLN
10.0000 mg | Freq: Once | INTRAMUSCULAR | Status: AC
  Administered 2012-04-05: 10 mg via INTRAVENOUS
  Filled 2012-04-04: qty 2

## 2012-04-04 MED ORDER — DIPHENHYDRAMINE HCL 50 MG/ML IJ SOLN
25.0000 mg | Freq: Once | INTRAMUSCULAR | Status: AC
  Administered 2012-04-05: 25 mg via INTRAVENOUS
  Filled 2012-04-04: qty 1

## 2012-04-04 MED ORDER — SODIUM CHLORIDE 0.9 % IV SOLN
INTRAVENOUS | Status: DC
  Administered 2012-04-05: via INTRAVENOUS

## 2012-04-04 MED ORDER — DEXAMETHASONE SODIUM PHOSPHATE 10 MG/ML IJ SOLN
10.0000 mg | Freq: Once | INTRAMUSCULAR | Status: AC
  Administered 2012-04-05: 10 mg via INTRAVENOUS
  Filled 2012-04-04: qty 1

## 2012-04-04 NOTE — ED Notes (Signed)
Pt c/o vaginal itching  X 5 days

## 2012-04-04 NOTE — ED Provider Notes (Signed)
History     CSN: [id number]  Arrival date & time 04/04/12  2320   First MD Initiated Contact with Patient 04/04/12 2337      Chief Complaint  Patient presents with  . Migraine  . Vaginal Itching    (Consider location/radiation/quality/duration/timing/severity/associated sxs/prior treatment) HPI This 36 year old several hours typical gradual onset of left-sided stabbing headache like she's had many times before with her migraines, she is no focal neurologic symptoms along with it, she is no change in speech vision swallowing or understanding, she is no weakness numbness or incoordination, usually goes away with her hydrocodone but did not tonight, she is leaving for vacation in [place] in the morning and once today with a headache before she leaves town, she is no trauma, there is no fever, this is just like her prior headaches, this is not the worst headache she's ever had, she's had good relief with injections in the ED before for her headaches, she also complains of a suspected vaginal yeast infection for several days like she's had before as well, she tried an over-the-counter yeast cream for a few days as well as one Diflucan tablet, she also tried in over-the-counter vaginal refresher solution without relief of her vaginal dryness sensation. She is no dysuria. She does have a history of chronic abdominal pain but does not have abdominal pain today, she does have stable chronic low back pain, she also has stable chronic generalized myalgias and arthralgias from her fibromyalgia which is unchanged, she is no other concerns. Past Medical History  Diagnosis Date  . Allergy   . Headache   . GERD (gastroesophageal reflux disease)   . Polycystic ovaries   . B12 deficiency   . Peripheral neuropathy   . Fibromyalgia   . Depression     Past Surgical History  Procedure Date  . Appendectomy   . Abdominal hysterectomy   . Tonsillectomy   . Sinusotomy 12/14/06  . Wrist ganglion  excision   . Ovarian cyst removal   . Oophorectomy     left overy  . Rectocele repair     Family History  Problem Relation Age of Onset  . Fibromyalgia Mother     History  Substance Use Topics  . Smoking status: Former Smoker    Quit date: 06/01/1998  . Smokeless tobacco: Never Used  . Alcohol Use: No    OB History    Grav Para Term Preterm Abortions TAB SAB Ect Mult Living   2 2  2  0     2      Review of Systems 10 Systems reviewed and are negative for acute change except as noted in the HPI. Allergies  Gadolinium derivatives  Home Medications   Current Outpatient Rx  Name  Route  Sig  Dispense  Refill  . ALPRAZOLAM 0.5 MG PO TABS   Oral   Take 1 tablet (0.5 mg total) by mouth 3 (three) times daily as needed for anxiety.   90 tablet   2   . GI COCKTAIL [place]   Oral   Take 30 mLs by mouth 3 (three) times daily. Shake well.   300 mL   5   . CELECOXIB 200 MG PO CAPS   Oral   Take 200 mg by mouth 2 (two) times daily.         . CYCLOBENZAPRINE HCL 10 MG PO TABS   Oral   Take 10 mg by mouth every 8 (eight) hours as needed. PATIENT  TAKES FOR MUSCLE SPASMS          . FLUCONAZOLE 150 MG PO TABS   Oral   Take 1 tablet (150 mg total) by mouth once.   4 tablet   0   . FLUTICASONE PROPIONATE 50 MCG/ACT NA SUSP      2 SPRAYS IN NASAL ROUTE DAILY.   16 g   9   . GABAPENTIN 300 MG PO CAPS   Oral   Take 300 mg by mouth 3 (three) times daily.         Marland Kitchen HYDROCHLOROTHIAZIDE 12.5 MG PO CAPS   Oral   Take 1 capsule (12.5 mg total) by mouth daily as needed (fluid ). swelling   30 capsule   11   . HYDROCODONE-ACETAMINOPHEN 7.5-500 MG PO TABS   Oral   Take 1 tablet by mouth every 6 (six) hours as needed for pain.   60 tablet   5   . MULTI-VITAMIN GUMMIES PO   Oral   Take 1 tablet by mouth daily.           Marland Kitchen PANTOPRAZOLE SODIUM 40 MG PO TBEC      TAKE 1 TABLET BY MOUTH EVERY DAY   30 tablet   9   . PHENTERMINE HCL 37.5 MG PO CAPS    Oral   Take 1 capsule (37.5 mg total) by mouth every morning.   30 capsule   5   . PHENTERMINE HCL 37.5 MG PO CAPS   Oral   Take 1 capsule (37.5 mg total) by mouth every morning.   30 capsule   5   . PROMETHAZINE HCL 25 MG PO TABS   Oral   Take 1 tablet (25 mg total) by mouth every 6 (six) hours as needed for nausea. PATIENT TAKES FOR NAUSEA   60 tablet   5     BP 133/90  Pulse 94  Temp 98.8 F (37.1 C) (Oral)  Resp 16  Ht 5\' 9"  (1.753 m)  Wt 249 lb (112.946 kg)  BMI 36.77 kg/m2  SpO2 99%  LMP 12/13/1999  Physical Exam  Nursing note and vitals reviewed. Constitutional:       Awake, alert, nontoxic appearance with baseline speech for patient.  HENT:  Head: Atraumatic.  Mouth/Throat: No oropharyngeal exudate.  Eyes: EOM are normal. Pupils are equal, round, and reactive to light. Right eye exhibits no discharge. Left eye exhibits no discharge.  Neck: Neck supple.  Cardiovascular: Normal rate and regular rhythm.   No murmur heard. Pulmonary/Chest: Effort normal and breath sounds normal. No stridor. No respiratory distress. She has no wheezes. She has no rales. She exhibits no tenderness.  Abdominal: Soft. Bowel sounds are normal. She exhibits no mass. There is no tenderness. There is no rebound.  Genitourinary:       Chaperone present for pelvic examination revealing thick white cottage cheese vaginal discharge no vaginal wall erythema, she has had a prior hysterectomy, bimanual examination is nontender, there is no purulent vaginal discharge no foul odor  Musculoskeletal: She exhibits no tenderness.       Baseline ROM, moves extremities with no obvious new focal weakness. Baseline mild lumbar and paralumbar tenderness.  Lymphadenopathy:    She has no cervical adenopathy.  Neurological: She is alert.       Awake, alert, cooperative and aware of situation; motor strength bilaterally; sensation normal to light touch bilaterally; peripheral visual fields full to  confrontation; no facial asymmetry; tongue midline; major cranial nerves  appear intact; no pronator drift, normal finger to nose bilaterally, baseline gait without new ataxia.  Skin: No rash noted.  Psychiatric: She has a normal mood and affect.    ED Course  Procedures (including critical care time)  Labs Reviewed  WET PREP, GENITAL - Abnormal; Notable for the following:    WBC, Wet Prep HPF POC FEW (*)     All other components within normal limits  URINALYSIS, ROUTINE W REFLEX MICROSCOPIC  MISCELLANEOUS TEST   No results found.   1. Migraine   2. Candida vaginitis       MDM  Pt stable in ED with no significant deterioration in condition.  Patient / Family / Caregiver informed of clinical course, understand medical decision-making process, and agree with plan.  I doubt any other EMC precluding discharge at this time including, but not necessarily limited to the following:SAH, CVA, SBI.        Hurman Horn, MD 04/05/12 [phone]

## 2012-04-05 MED ORDER — FLUCONAZOLE 150 MG PO TABS: 150.0000 mg | ORAL_TABLET | Freq: Once | ORAL | Status: DC

## 2012-04-05 NOTE — ED Notes (Signed)
Family at bedside. 

## 2012-04-05 NOTE — ED Notes (Signed)
Pt. Is in no distress and reports she is going out of state in am and needs relief from the vaginal itching and from the headache she reports.

## 2012-04-07 LAB — GC/CHLAMYDIA PROBE AMP
CT Probe RNA: NEGATIVE
GC Probe RNA: NEGATIVE

## 2012-04-08 LAB — MISCELLANEOUS TEST

## 2012-04-25 ENCOUNTER — Encounter (INDEPENDENT_AMBULATORY_CARE_PROVIDER_SITE_OTHER): Payer: Self-pay | Admitting: Surgery

## 2012-04-25 ENCOUNTER — Ambulatory Visit (INDEPENDENT_AMBULATORY_CARE_PROVIDER_SITE_OTHER): Payer: Private Health Insurance - Indemnity | Admitting: Surgery

## 2012-04-25 VITALS — BP 142/102 | HR 130 | Temp 97.6°F | Resp 20 | Ht 69.0 in | Wt 257.0 lb

## 2012-04-25 DIAGNOSIS — K649 Unspecified hemorrhoids: Secondary | ICD-10-CM | POA: Insufficient documentation

## 2012-04-25 MED ORDER — HYDROCORTISONE ACE-PRAMOXINE 1-1 % RE FOAM: 1.0000 | Freq: Two times a day (BID) | RECTAL | Status: DC

## 2012-04-25 NOTE — Patient Instructions (Addendum)
High-Fiber Diet Fiber is found in fruits, vegetables, and grains. A high-fiber diet encourages the addition of more whole grains, legumes, fruits, and vegetables in your diet. The recommended amount of fiber for adult males is 38 g per day. For adult females, it is 25 g per day. Pregnant and lactating women should get 28 g of fiber per day. If you have a digestive or bowel problem, ask your caregiver for advice before adding high-fiber foods to your diet. Eat a variety of high-fiber foods instead of only a select few type of foods.  PURPOSE  To increase stool bulk.  To make bowel movements more regular to prevent constipation.  To lower cholesterol.  To prevent overeating. WHEN IS THIS DIET USED?  It may be used if you have constipation and hemorrhoids.  It may be used if you have uncomplicated diverticulosis (intestine condition) and irritable bowel syndrome.  It may be used if you need help with weight management.  It may be used if you want to add it to your diet as a protective measure against atherosclerosis, diabetes, and cancer. SOURCES OF FIBER  Whole-grain breads and cereals.  Fruits, such as apples, oranges, bananas, berries, prunes, and pears.  Vegetables, such as green peas, carrots, sweet potatoes, beets, broccoli, cabbage, spinach, and artichokes.  Legumes, such split peas, soy, lentils.  Almonds. FIBER CONTENT IN FOODS Starches and Grains / Dietary Fiber (g)  Cheerios, 1 cup / 3 g  Corn Flakes cereal, 1 cup / 0.7 g  Rice crispy treat cereal, 1 cup / 0.3 g  Instant oatmeal (cooked),  cup / 2 g  Frosted wheat cereal, 1 cup / 5.1 g  Brown, long-grain rice (cooked), 1 cup / 3.5 g  White, long-grain rice (cooked), 1 cup / 0.6 g  Enriched macaroni (cooked), 1 cup / 2.5 g Legumes / Dietary Fiber (g)  Baked beans (canned, plain, or vegetarian),  cup / 5.2 g  Kidney beans (canned),  cup / 6.8 g  Pinto beans (cooked),  cup / 5.5 g Breads and Crackers  / Dietary Fiber (g)  Plain or honey graham crackers, 2 squares / 0.7 g  Saltine crackers, 3 squares / 0.3 g  Plain, salted pretzels, 10 pieces / 1.8 g  Whole-wheat bread, 1 slice / 1.9 g  White bread, 1 slice / 0.7 g  Raisin bread, 1 slice / 1.2 g  Plain bagel, 3 oz / 2 g  Flour tortilla, 1 oz / 0.9 g  Corn tortilla, 1 small / 1.5 g  Hamburger or hotdog bun, 1 small / 0.9 g Fruits / Dietary Fiber (g)  Apple with skin, 1 medium / 4.4 g  Sweetened applesauce,  cup / 1.5 g  Banana,  medium / 1.5 g  Grapes, 10 grapes / 0.4 g  Orange, 1 small / 2.3 g  Raisin, 1.5 oz / 1.6 g  Melon, 1 cup / 1.4 g Vegetables / Dietary Fiber (g)  Green beans (canned),  cup / 1.3 g  Carrots (cooked),  cup / 2.3 g  Broccoli (cooked),  cup / 2.8 g  Peas (cooked),  cup / 4.4 g  Mashed potatoes,  cup / 1.6 g  Lettuce, 1 cup / 0.5 g  Corn (canned),  cup / 1.6 g  Tomato,  cup / 1.1 g Document Released: 05/18/2005 Document Revised: 11/17/2011 Document Reviewed: 08/20/2011 [employer] Patient Information 2013 [employer]. Hemorrhoids Hemorrhoids are enlarged (dilated) veins around the rectum. There are 2 types of hemorrhoids, and   the type of hemorrhoid is determined by its location. Internal hemorrhoids occur in the veins just inside the rectum.They are usually not painful, but they may bleed.However, they may poke through to the outside and become irritated and painful. External hemorrhoids involve the veins outside the anus and can be felt as a painful swelling or hard lump near the anus.They are often itchy and may crack and bleed. Sometimes clots will form in the veins. This makes them swollen and painful. These are called thrombosed hemorrhoids. CAUSES Causes of hemorrhoids include:  Pregnancy. This increases the pressure in the hemorrhoidal veins.  Constipation.  Straining to have a bowel movement.  Obesity.  Heavy lifting or other activity that caused you to  strain. TREATMENT Most of the time hemorrhoids improve in 1 to 2 weeks. However, if symptoms do not seem to be getting better or if you have a lot of rectal bleeding, your caregiver may perform a procedure to help make the hemorrhoids get smaller or remove them completely.Possible treatments include:  Rubber band ligation. A rubber band is placed at the base of the hemorrhoid to cut off the circulation.  Sclerotherapy. A chemical is injected to shrink the hemorrhoid.  Infrared light therapy. Tools are used to burn the hemorrhoid.  Hemorrhoidectomy. This is surgical removal of the hemorrhoid. HOME CARE INSTRUCTIONS   Increase fiber in your diet. Ask your caregiver about using fiber supplements.  Drink enough water and fluids to keep your urine clear or pale yellow.  Exercise regularly.  Go to the bathroom when you have the urge to have a bowel movement. Do not wait.  Avoid straining to have bowel movements.  Keep the anal area dry and clean.  Only take over-the-counter or prescription medicines for pain, discomfort, or fever as directed by your caregiver. If your hemorrhoids are thrombosed:  Take warm sitz baths for 20 to 30 minutes, 3 to 4 times per day.  If the hemorrhoids are very tender and swollen, place ice packs on the area as tolerated. Using ice packs between sitz baths may be helpful. Fill a plastic bag with ice. Place a towel between the bag of ice and your skin.  Medicated creams and suppositories may be used or applied as directed.  Do not use a donut-shaped pillow or sit on the toilet for long periods. This increases blood pooling and pain. SEEK MEDICAL CARE IF:   You have increasing pain and swelling that is not controlled with your medicine.  You have uncontrolled bleeding.  You have difficulty or you are unable to have a bowel movement.  You have pain or inflammation outside the area of the hemorrhoids.  You have chills or an oral temperature above 102 F  (38.9 C). MAKE SURE YOU:   Understand these instructions.  Will watch your condition.  Will get help right away if you are not doing well or get worse. Document Released: 05/15/2000 Document Revised: 08/10/2011 Document Reviewed: 04/28/2010 [employer] Patient Information 2013 [employer].  

## 2012-04-25 NOTE — Progress Notes (Signed)
Patient ID: [name], female   DOB: January 16, 1976, 36 y.o.   MRN: [id number]  Chief Complaint  Patient presents with  . New Evaluation    hems    HPI [name] is a 36 y.o. female.  Patient sent at the request of Dr. Clent Ridges for hemorrhoids. She's had hemorrhoid tissue for 16 years since the birth of her last child. Over last 2 months she's had more burning, itching and bleeding. The symptoms are not severe but are persistent. No severe pain. She does have occasional bleeding. HPI  Past Medical History  Diagnosis Date  . Allergy   . Headache   . GERD (gastroesophageal reflux disease)   . Polycystic ovaries   . B12 deficiency   . Peripheral neuropathy   . Fibromyalgia   . Depression     Past Surgical History  Procedure Date  . Appendectomy   . Abdominal hysterectomy   . Tonsillectomy   . Sinusotomy 12/14/06  . Wrist ganglion excision   . Ovarian cyst removal   . Oophorectomy     left overy  . Rectocele repair     Family History  Problem Relation Age of Onset  . Fibromyalgia Mother   . Diabetes Mother   . Cancer Mother     thyrod  . Liver disease Mother   . Neuropathy Mother   . Hypertension Father   . Diabetes Father   . Heart disease Father     Social History History  Substance Use Topics  . Smoking status: Former Smoker    Quit date: 06/01/1998  . Smokeless tobacco: Never Used  . Alcohol Use: No    Allergies  Allergen Reactions  . Gadolinium Derivatives Swelling and Other (See Comments)    Arm swelling, tingling, & redness. Radiologist Dr. Grace Isaac recommends an injection of steroids if patient needs a gadolinium based contrast in the future.    Current Outpatient Prescriptions  Medication Sig Dispense Refill  . ALPRAZolam (XANAX) 0.5 MG tablet Take 1 tablet (0.5 mg total) by mouth 3 (three) times daily as needed for anxiety.  90 tablet  2  . Alum & Mag Hydroxide-Simeth (GI COCKTAIL) SUSP suspension Take 30 mLs by mouth 3 (three) times daily. Shake  well.  300 mL  5  . celecoxib (CELEBREX) 200 MG capsule Take 200 mg by mouth 2 (two) times daily.      . cyclobenzaprine (FLEXERIL) 10 MG tablet Take 10 mg by mouth every 8 (eight) hours as needed. PATIENT TAKES FOR MUSCLE SPASMS       . fluconazole (DIFLUCAN) 150 MG tablet Take 1 tablet (150 mg total) by mouth once.  4 tablet  0  . fluticasone (FLONASE) 50 MCG/ACT nasal spray 2 SPRAYS IN NASAL ROUTE DAILY.  16 g  9  . gabapentin (NEURONTIN) 300 MG capsule Take 300 mg by mouth 3 (three) times daily.      . hydrochlorothiazide (MICROZIDE) 12.5 MG capsule Take 1 capsule (12.5 mg total) by mouth daily as needed (fluid ). swelling  30 capsule  11  . HYDROcodone-acetaminophen (LORTAB) 7.5-500 MG per tablet Take 1 tablet by mouth every 6 (six) hours as needed for pain.  60 tablet  5  . Multiple Vitamins-Minerals (MULTI-VITAMIN GUMMIES PO) Take 1 tablet by mouth daily.        . pantoprazole (PROTONIX) 40 MG tablet TAKE 1 TABLET BY MOUTH EVERY DAY  30 tablet  9  . promethazine (PHENERGAN) 25 MG tablet Take 1 tablet (25 mg total)  by mouth every 6 (six) hours as needed for nausea. PATIENT TAKES FOR NAUSEA  60 tablet  5  . hydrocortisone-pramoxine (PROCTOFOAM HC) rectal foam Place 1 applicator rectally 2 (two) times daily.  10 g  0  . phentermine 37.5 MG capsule Take 1 capsule (37.5 mg total) by mouth every morning.  30 capsule  5  . phentermine 37.5 MG capsule Take 1 capsule (37.5 mg total) by mouth every morning.  30 capsule  5    Review of Systems Review of Systems  Constitutional: Negative for fever, chills and unexpected weight change.  HENT: Negative for hearing loss, congestion, sore throat, trouble swallowing and voice change.   Eyes: Negative for visual disturbance.  Respiratory: Negative for cough and wheezing.   Cardiovascular: Negative for chest pain, palpitations and leg swelling.  Gastrointestinal: Negative for nausea, vomiting, abdominal pain, diarrhea, constipation, blood in stool,  abdominal distention and anal bleeding.  Genitourinary: Negative for hematuria, vaginal bleeding and difficulty urinating.  Musculoskeletal: Negative for arthralgias.  Skin: Negative for rash and wound.  Neurological: Negative for seizures, syncope and headaches.  Hematological: Negative for adenopathy. Does not bruise/bleed easily.  Psychiatric/Behavioral: Negative for confusion.    Blood pressure 142/102, pulse 130, temperature 97.6 F (36.4 C), temperature source Oral, resp. rate 20, height 5\' 9"  (1.753 m), weight 257 lb (116.574 kg), last menstrual period 12/13/1999.  Physical Exam Physical Exam  Constitutional: She appears well-developed and well-nourished.  HENT:  Head: Normocephalic and atraumatic.  Eyes: EOM are normal. Pupils are equal, round, and reactive to light.  Neck: Normal range of motion. Neck supple.  Abdominal:       Anoscopy shows grade 2 internal hemorrhoid left lateral  Genitourinary:         Assessment    Grade 2 internal external hemorrhoids left lateral column    Plan    Discussed options of medical versus surgical care at this point in time. Risks benefits and alternative therapies discussed. Discussed role of banding and sclerotherapy as well as hemorrhoidectomy and the role of medical treatment which include a high-fiber diet and topical treatment. After lengthy discussion medical treatment was chosen. Will return in 6 weeks for reassessment. If no better, banding and sclerotherapy can be done. She only has one column disease at this point and is quite minimal.       Lurie Mullane A. 04/25/2012, 4:04 PM

## 2012-04-26 ENCOUNTER — Other Ambulatory Visit: Payer: Self-pay | Admitting: Family Medicine

## 2012-04-26 NOTE — Telephone Encounter (Signed)
Call in #60 with 5 rf 

## 2012-05-03 ENCOUNTER — Ambulatory Visit (INDEPENDENT_AMBULATORY_CARE_PROVIDER_SITE_OTHER): Payer: Managed Care, Other (non HMO) | Admitting: Family Medicine

## 2012-05-03 ENCOUNTER — Encounter: Payer: Self-pay | Admitting: Family Medicine

## 2012-05-03 VITALS — BP 128/92 | HR 122 | Temp 98.4°F | Wt 264.0 lb

## 2012-05-03 DIAGNOSIS — J329 Chronic sinusitis, unspecified: Secondary | ICD-10-CM

## 2012-05-03 MED ORDER — METHYLPREDNISOLONE ACETATE 80 MG/ML IJ SUSP
120.0000 mg | Freq: Once | INTRAMUSCULAR | Status: AC
  Administered 2012-05-03: 120 mg via INTRAMUSCULAR

## 2012-05-03 MED ORDER — FLUCONAZOLE 150 MG PO TABS: 150.0000 mg | ORAL_TABLET | Freq: Once | ORAL | Status: DC

## 2012-05-03 MED ORDER — HYDROCHLOROTHIAZIDE 25 MG PO TABS: 25.0000 mg | ORAL_TABLET | Freq: Every day | ORAL | Status: DC

## 2012-05-03 MED ORDER — LEVOFLOXACIN 500 MG PO TABS: 500.0000 mg | ORAL_TABLET | Freq: Every day | ORAL | Status: DC

## 2012-05-03 NOTE — Progress Notes (Signed)
  Subjective:    Patient ID: [name], female    DOB: 18-Nov-1975, 36 y.o.   MRN: [id number]  HPI Here for 3 weeks of sinus pressure PND, HAs , and ST. No fever or cough.    Review of Systems  Constitutional: Negative.   HENT: Positive for congestion, postnasal drip and sinus pressure.   Eyes: Negative.   Respiratory: Negative.        Objective:   Physical Exam  Constitutional: She appears well-developed and well-nourished.  HENT:  Right Ear: External ear normal.  Left Ear: External ear normal.  Nose: Nose normal.  Mouth/Throat: Oropharynx is clear and moist. No oropharyngeal exudate.  Eyes: Conjunctivae normal are normal.  Pulmonary/Chest: Effort normal and breath sounds normal.  Lymphadenopathy:    She has no cervical adenopathy.          Assessment & Plan:  Recheck prn

## 2012-05-03 NOTE — Addendum Note (Signed)
Addended by: Aniceto Boss A on: 05/03/2012 12:27 PM   Modules accepted: Orders

## 2012-05-30 ENCOUNTER — Other Ambulatory Visit: Payer: Self-pay | Admitting: Family Medicine

## 2012-06-03 NOTE — Telephone Encounter (Signed)
Call in #90 with 5 rf 

## 2012-06-06 ENCOUNTER — Encounter (INDEPENDENT_AMBULATORY_CARE_PROVIDER_SITE_OTHER): Payer: Private Health Insurance - Indemnity | Admitting: Surgery

## 2012-06-06 ENCOUNTER — Other Ambulatory Visit: Payer: Self-pay | Admitting: Family Medicine

## 2012-08-01 ENCOUNTER — Telehealth: Payer: Self-pay | Admitting: Family Medicine

## 2012-08-01 NOTE — Telephone Encounter (Signed)
Refill request for Lortab and change dose.

## 2012-08-02 MED ORDER — HYDROCODONE-ACETAMINOPHEN 7.5-325 MG PO TABS: 1.0000 | ORAL_TABLET | Freq: Four times a day (QID) | ORAL | Status: DC | PRN

## 2012-08-02 NOTE — Telephone Encounter (Signed)
Rx changed and sent to pharmacy.  °

## 2012-08-02 NOTE — Telephone Encounter (Signed)
Change Lortab to 7.5/325 to use q 6 hours prn pain, call in #60 with 5 rf

## 2012-08-29 ENCOUNTER — Other Ambulatory Visit: Payer: Self-pay | Admitting: Family Medicine

## 2012-09-26 ENCOUNTER — Telehealth: Payer: Self-pay | Admitting: Family Medicine

## 2012-09-26 NOTE — Telephone Encounter (Signed)
Refill request for HCTZ 25 mg and a 90 day supply.

## 2012-09-27 ENCOUNTER — Other Ambulatory Visit: Payer: Self-pay | Admitting: *Deleted

## 2012-09-27 MED ORDER — HYDROCHLOROTHIAZIDE 25 MG PO TABS: 25.0000 mg | ORAL_TABLET | Freq: Every day | ORAL | Status: DC

## 2012-10-23 ENCOUNTER — Emergency Department (HOSPITAL_BASED_OUTPATIENT_CLINIC_OR_DEPARTMENT_OTHER)
Admission: EM | Admit: 2012-10-23 | Discharge: 2012-10-23 | Disposition: A | Discharge status: Home / Self Care | Payer: Managed Care, Other (non HMO) | Attending: Emergency Medicine | Admitting: Emergency Medicine

## 2012-10-23 ENCOUNTER — Encounter (HOSPITAL_BASED_OUTPATIENT_CLINIC_OR_DEPARTMENT_OTHER): Payer: Self-pay | Admitting: *Deleted

## 2012-10-23 DIAGNOSIS — G609 Hereditary and idiopathic neuropathy, unspecified: Secondary | ICD-10-CM | POA: Insufficient documentation

## 2012-10-23 DIAGNOSIS — Z79899 Other long term (current) drug therapy: Secondary | ICD-10-CM | POA: Insufficient documentation

## 2012-10-23 DIAGNOSIS — K219 Gastro-esophageal reflux disease without esophagitis: Secondary | ICD-10-CM | POA: Insufficient documentation

## 2012-10-23 DIAGNOSIS — Z87891 Personal history of nicotine dependence: Secondary | ICD-10-CM | POA: Insufficient documentation

## 2012-10-23 DIAGNOSIS — G43909 Migraine, unspecified, not intractable, without status migrainosus: Secondary | ICD-10-CM | POA: Insufficient documentation

## 2012-10-23 DIAGNOSIS — F329 Major depressive disorder, single episode, unspecified: Secondary | ICD-10-CM | POA: Insufficient documentation

## 2012-10-23 DIAGNOSIS — Z8742 Personal history of other diseases of the female genital tract: Secondary | ICD-10-CM | POA: Insufficient documentation

## 2012-10-23 DIAGNOSIS — IMO0001 Reserved for inherently not codable concepts without codable children: Secondary | ICD-10-CM | POA: Insufficient documentation

## 2012-10-23 DIAGNOSIS — R110 Nausea: Secondary | ICD-10-CM | POA: Insufficient documentation

## 2012-10-23 DIAGNOSIS — Z791 Long term (current) use of non-steroidal anti-inflammatories (NSAID): Secondary | ICD-10-CM | POA: Insufficient documentation

## 2012-10-23 DIAGNOSIS — F3289 Other specified depressive episodes: Secondary | ICD-10-CM | POA: Insufficient documentation

## 2012-10-23 DIAGNOSIS — Z8639 Personal history of other endocrine, nutritional and metabolic disease: Secondary | ICD-10-CM | POA: Insufficient documentation

## 2012-10-23 DIAGNOSIS — IMO0002 Reserved for concepts with insufficient information to code with codable children: Secondary | ICD-10-CM | POA: Insufficient documentation

## 2012-10-23 HISTORY — DX: Migraine, unspecified, not intractable, without status migrainosus: G43.909

## 2012-10-23 MED ORDER — DIPHENHYDRAMINE HCL 50 MG/ML IJ SOLN
25.0000 mg | Freq: Once | INTRAMUSCULAR | Status: AC
  Administered 2012-10-23: 25 mg via INTRAVENOUS
  Filled 2012-10-23: qty 1

## 2012-10-23 MED ORDER — PROMETHAZINE HCL 25 MG/ML IJ SOLN
12.5000 mg | Freq: Once | INTRAMUSCULAR | Status: AC
  Administered 2012-10-23: 12.5 mg via INTRAVENOUS
  Filled 2012-10-23: qty 1

## 2012-10-23 MED ORDER — PROMETHAZINE HCL 25 MG RE SUPP: 25.0000 mg | Freq: Four times a day (QID) | RECTAL | Status: DC | PRN

## 2012-10-23 MED ORDER — KETOROLAC TROMETHAMINE 30 MG/ML IJ SOLN
30.0000 mg | Freq: Once | INTRAMUSCULAR | Status: AC
  Administered 2012-10-23: 30 mg via INTRAVENOUS
  Filled 2012-10-23: qty 1

## 2012-10-23 MED ORDER — METOCLOPRAMIDE HCL 5 MG/ML IJ SOLN
10.0000 mg | Freq: Once | INTRAMUSCULAR | Status: AC
  Administered 2012-10-23: 10 mg via INTRAVENOUS
  Filled 2012-10-23: qty 2

## 2012-10-23 MED ORDER — DEXAMETHASONE SODIUM PHOSPHATE 10 MG/ML IJ SOLN
10.0000 mg | Freq: Once | INTRAMUSCULAR | Status: AC
  Administered 2012-10-23: 10 mg via INTRAVENOUS
  Filled 2012-10-23: qty 1

## 2012-10-23 MED ORDER — SODIUM CHLORIDE 0.9 % IV BOLUS (SEPSIS)
1000.0000 mL | Freq: Once | INTRAVENOUS | Status: AC
  Administered 2012-10-23: 1000 mL via INTRAVENOUS

## 2012-10-23 NOTE — ED Notes (Signed)
Migraine headache x 3 days- hx of same- nausea

## 2012-10-23 NOTE — ED Provider Notes (Signed)
History     CSN: [id number]  Arrival date & time 10/23/12  0222   First MD Initiated Contact with Patient 10/23/12 0244      Chief Complaint  Patient presents with  . Migraine    (Consider location/radiation/quality/duration/timing/severity/associated sxs/prior treatment) Patient is a 37 y.o. female presenting with migraines. The history is provided by the patient.  Migraine This is a recurrent problem. The current episode started more than 2 days ago. The problem occurs constantly. The problem has not changed since onset.Pertinent negatives include no chest pain, no abdominal pain and no shortness of breath. Nothing aggravates the symptoms. Nothing relieves the symptoms. Treatments tried: norco. The treatment provided no relief.  tope right head, sharp, not sudden onset not the worst HA of her life this is a typical migraine for the patient  Past Medical History  Diagnosis Date  . Allergy   . Headache   . GERD (gastroesophageal reflux disease)   . Polycystic ovaries   . B12 deficiency   . Peripheral neuropathy   . Fibromyalgia   . Depression   . Migraine     Past Surgical History  Procedure Laterality Date  . Appendectomy    . Abdominal hysterectomy    . Tonsillectomy    . Sinusotomy  12/14/06  . Wrist ganglion excision    . Ovarian cyst removal    . Oophorectomy      left overy  . Rectocele repair    . Bladder tack    . Back surgery      Family History  Problem Relation Age of Onset  . Fibromyalgia Mother   . Diabetes Mother   . Cancer Mother     thyrod  . Liver disease Mother   . Neuropathy Mother   . Hypertension Father   . Diabetes Father   . Heart disease Father     History  Substance Use Topics  . Smoking status: Former Smoker    Quit date: 06/01/1998  . Smokeless tobacco: Never Used  . Alcohol Use: No    OB History   Grav Para Term Preterm Abortions TAB SAB Ect Mult Living   2 2  2  0     2      Review of Systems  Constitutional:  Negative for fever.  HENT: Negative for neck pain and neck stiffness.   Respiratory: Negative for shortness of breath.   Cardiovascular: Negative for chest pain.  Gastrointestinal: Positive for nausea. Negative for abdominal pain.  Skin: Negative for rash.  Neurological: Negative for speech difficulty, weakness and light-headedness.  All other systems reviewed and are negative.    Allergies  Gadolinium derivatives  Home Medications   Current Outpatient Rx  Name  Route  Sig  Dispense  Refill  . ALPRAZolam (XANAX) 0.5 MG tablet      TAKE ONE TABLET BY MOUTH 3 TIMES DAILY AS NEEDED FOR ANXIETY   90 tablet   5   . Alum & Mag Hydroxide-Simeth (GI COCKTAIL) SUSP suspension   Oral   Take 30 mLs by mouth 3 (three) times daily. Shake well.   300 mL   5   . celecoxib (CELEBREX) 200 MG capsule   Oral   Take 200 mg by mouth 2 (two) times daily.         . cyclobenzaprine (FLEXERIL) 10 MG tablet   Oral   Take 10 mg by mouth every 8 (eight) hours as needed. PATIENT TAKES FOR MUSCLE SPASMS          .  fluticasone (FLONASE) 50 MCG/ACT nasal spray      2 SPRAYS IN NASAL ROUTE DAILY.   16 g   9   . gabapentin (NEURONTIN) 300 MG capsule   Oral   Take 300 mg by mouth 3 (three) times daily.         . hydrochlorothiazide (HYDRODIURIL) 25 MG tablet   Oral   Take 1 tablet (25 mg total) by mouth daily.   90 tablet   3   . HYDROcodone-acetaminophen (NORCO) 7.5-325 MG per tablet   Oral   Take 1 tablet by mouth every 6 (six) hours as needed for pain.   60 tablet   5   . Multiple Vitamins-Minerals (MULTI-VITAMIN GUMMIES PO)   Oral   Take 1 tablet by mouth daily.           . pantoprazole (PROTONIX) 40 MG tablet      TAKE 1 TABLET BY MOUTH EVERY DAY   30 tablet   9   . promethazine (PHENERGAN) 25 MG tablet   Oral   Take 1 tablet (25 mg total) by mouth every 6 (six) hours as needed for nausea. PATIENT TAKES FOR NAUSEA   60 tablet   5   . fluconazole (DIFLUCAN) 150 MG  tablet      TAKE AS DIRECTED   1 tablet   5   . hydrocortisone-pramoxine (PROCTOFOAM HC) rectal foam   Rectal   Place 1 applicator rectally 2 (two) times daily.   10 g   0   . levofloxacin (LEVAQUIN) 500 MG tablet      TAKE 1 TABLET EVERY DAY   10 tablet   0     BP 136/95  Pulse 111  Temp(Src) 98.2 F (36.8 C) (Oral)  Resp 20  Ht 5\' 9"  (1.753 m)  Wt 257 lb (116.574 kg)  BMI 37.93 kg/m2  SpO2 98%  LMP 12/13/1999  Physical Exam  Constitutional: She is oriented to person, place, and time. She appears well-developed and well-nourished. No distress.  HENT:  Head: Normocephalic and atraumatic.  Mouth/Throat: Oropharynx is clear and moist.  No temporal bulging nor tenderness  Eyes: Conjunctivae and EOM are normal. Pupils are equal, round, and reactive to light.  Neck: Normal range of motion. Neck supple.  Cardiovascular: Normal rate, regular rhythm and intact distal pulses.   Pulmonary/Chest: Effort normal and breath sounds normal. She has no wheezes. She has no rales.  Abdominal: Soft. Bowel sounds are normal. There is no tenderness. There is no rebound and no guarding.  Musculoskeletal: Normal range of motion. She exhibits no edema.  Neurological: She is alert and oriented to person, place, and time. She has normal reflexes. No cranial nerve deficit. She exhibits normal muscle tone.  Skin: Skin is warm and dry.  Psychiatric: She has a normal mood and affect.    ED Course  Procedures (including critical care time)  Labs Reviewed - No data to display No results found.   No diagnosis found.    MDM  Migraine, typical for patient.  No atypia.  No indication for imaging nor LP at this time.  Feeling markedly improved post medication.  Will prescribe phenergan suppositories.  Return for worsening symptoms       Brigido Mera K Bowen Kia-Rasch, MD 10/23/12 [phone]

## 2012-10-26 ENCOUNTER — Other Ambulatory Visit: Payer: Self-pay | Admitting: Family Medicine

## 2012-10-27 NOTE — Telephone Encounter (Signed)
Okay for 6 months 

## 2012-12-07 ENCOUNTER — Other Ambulatory Visit: Payer: Self-pay | Admitting: Family Medicine

## 2012-12-07 NOTE — Telephone Encounter (Signed)
Pt was last seen on 05/03/12. It looks like it is too soon on the pain medications but Xanax is due.

## 2012-12-08 ENCOUNTER — Telehealth: Payer: Self-pay | Admitting: Family Medicine

## 2012-12-08 MED ORDER — PANTOPRAZOLE SODIUM 40 MG PO TBEC: 40.0000 mg | DELAYED_RELEASE_TABLET | Freq: Every day | ORAL | Status: DC

## 2012-12-08 NOTE — Telephone Encounter (Signed)
Too early for  Opiate  Hydrocodone as per note review  Denied . Dr fry can address next week. When he returns   Can rx  aprazolam  40 #  Enough to get through to  Have dr fry do further refills .

## 2012-12-08 NOTE — Telephone Encounter (Signed)
Refill request for Pantoprazole 40 mg take 1 po qd and a 90 day supply, which I did send e-scribe.

## 2012-12-09 MED ORDER — ALPRAZOLAM 0.5 MG PO TABS: 0.5000 mg | ORAL_TABLET | Freq: Three times a day (TID) | ORAL | Status: DC | PRN

## 2012-12-09 NOTE — Addendum Note (Signed)
Addended by: Aniceto Boss A on: 12/09/2012 01:42 PM   Modules accepted: Orders

## 2012-12-30 ENCOUNTER — Other Ambulatory Visit: Payer: Self-pay | Admitting: Family Medicine

## 2012-12-30 NOTE — Telephone Encounter (Signed)
Call in #60 with 5 rf 

## 2013-01-04 ENCOUNTER — Ambulatory Visit (INDEPENDENT_AMBULATORY_CARE_PROVIDER_SITE_OTHER): Payer: Managed Care, Other (non HMO) | Admitting: Family Medicine

## 2013-01-04 ENCOUNTER — Encounter: Payer: Self-pay | Admitting: Family Medicine

## 2013-01-04 VITALS — BP 140/90 | HR 98 | Temp 98.6°F | Wt 265.0 lb

## 2013-01-04 DIAGNOSIS — R9082 White matter disease, unspecified: Secondary | ICD-10-CM

## 2013-01-04 DIAGNOSIS — R930 Abnormal findings on diagnostic imaging of skull and head, not elsewhere classified: Secondary | ICD-10-CM

## 2013-01-04 DIAGNOSIS — G43909 Migraine, unspecified, not intractable, without status migrainosus: Secondary | ICD-10-CM

## 2013-01-04 DIAGNOSIS — I10 Essential (primary) hypertension: Secondary | ICD-10-CM

## 2013-01-04 MED ORDER — LISINOPRIL-HYDROCHLOROTHIAZIDE 20-25 MG PO TABS
1.0000 | ORAL_TABLET | Freq: Every day | ORAL | Status: DC
Start: 2013-01-04 — End: 2013-02-21

## 2013-01-04 NOTE — Progress Notes (Signed)
  Subjective:    Patient ID: [name], female    DOB: Nov 20, 1975, 37 y.o.   MRN: [id number]  HPI Here to follow up on HTN and migraines, and she has some other questions. Her migraines have been under good control, but her BP has been running high when she checks it at home or at the pharmacy. She often gets 140s to 160s over 90s to 110s. No chest pain or SOB. She is still exercising and dieting but her weight has not changed. She also asks about the possibility of repeating a brain MRI to follow diffuse white matter lesions that were seen in 2005 and again in 2008. She was seeing Dr. Vela Prose at that time for the migrianes. It was unclear as to the etiology of these lesions, with possibilities including demyelinating disease, hypertensive changes, vasculitis, or simply the migraines. No change was noted from the first scan to the second. Other than the HAs she has had no other neurologic symptoms.    Review of Systems  Constitutional: Negative.   Respiratory: Negative.   Cardiovascular: Negative.   Neurological: Positive for headaches. Negative for dizziness, tremors, seizures, syncope, facial asymmetry, speech difficulty, weakness, light-headedness and numbness.       Objective:   Physical Exam  Constitutional: She is oriented to person, place, and time. She appears well-developed and well-nourished.  Neck: No thyromegaly present.  Cardiovascular: Normal rate, regular rhythm, normal heart sounds and intact distal pulses.   Pulmonary/Chest: Effort normal and breath sounds normal.  Lymphadenopathy:    She has no cervical adenopathy.  Neurological: She is alert and oriented to person, place, and time. She has normal reflexes. No cranial nerve deficit. She exhibits normal muscle tone. Coordination normal.          Assessment & Plan:  We will switch from HCTZ to Lisinopril HCT to get the BP down. We will recheck in one month. Will refer her to [facility] to evaluate and determine  if it would be a good idea to repeat a brain MRI.

## 2013-02-01 ENCOUNTER — Encounter: Payer: Self-pay | Admitting: Family Medicine

## 2013-02-01 ENCOUNTER — Ambulatory Visit (INDEPENDENT_AMBULATORY_CARE_PROVIDER_SITE_OTHER): Payer: BC Managed Care – PPO | Admitting: Family Medicine

## 2013-02-01 VITALS — BP 88/60 | HR 115 | Temp 98.4°F | Wt 262.0 lb

## 2013-02-01 DIAGNOSIS — I10 Essential (primary) hypertension: Secondary | ICD-10-CM

## 2013-02-01 NOTE — Progress Notes (Signed)
  Subjective:    Patient ID: [name], female    DOB: 1975/07/22, 37 y.o.   MRN: [id number]  HPI Here to follow up on BP 4 weeks after starting on Lisinopril HCT. After 2 weeks she felt good and her BP was perfect, around 118/74 or so. Then over the next 2 weeks she started feeling very tired and fatigued, and she gets lightheaded when she stands up. No chest pain or SOB.    Review of Systems  Constitutional: Positive for fatigue.  Respiratory: Negative.   Cardiovascular: Negative.   Neurological: Positive for dizziness and light-headedness. Negative for headaches.       Objective:   Physical Exam  Constitutional: She appears well-developed and well-nourished.  She does not appear to feel well, alert   Neck: No thyromegaly present.  Cardiovascular: Normal rate, regular rhythm, normal heart sounds and intact distal pulses.   Pulmonary/Chest: Effort normal and breath sounds normal.  Lymphadenopathy:    She has no cervical adenopathy.  Neurological: She is alert.          Assessment & Plan:  Hypotension due to medication. She will stop taking Lisinopril HCT and take nothing for 3 days. Then this weekend she will start back on HCTZ alone. Recheck 2 weeks after that

## 2013-02-06 ENCOUNTER — Ambulatory Visit: Payer: Managed Care, Other (non HMO) | Admitting: Neurology

## 2013-02-09 ENCOUNTER — Other Ambulatory Visit: Payer: Self-pay | Admitting: Family Medicine

## 2013-02-09 MED ORDER — FLUTICASONE PROPIONATE 50 MCG/ACT NA SUSP: NASAL | Status: DC

## 2013-02-09 MED ORDER — ALPRAZOLAM 0.5 MG PO TABS: 0.5000 mg | ORAL_TABLET | Freq: Three times a day (TID) | ORAL | Status: DC | PRN

## 2013-02-09 NOTE — Telephone Encounter (Signed)
Call in Xanax #90 with 5 rf, also Fluticasone for a year

## 2013-02-14 ENCOUNTER — Telehealth: Payer: Self-pay | Admitting: Family Medicine

## 2013-02-14 MED ORDER — FLUCONAZOLE 150 MG PO TABS: 150.0000 mg | ORAL_TABLET | Freq: Once | ORAL | Status: DC

## 2013-02-14 NOTE — Telephone Encounter (Signed)
Call in #1 with 11 rf 

## 2013-02-14 NOTE — Telephone Encounter (Signed)
I sent script e-scribe. 

## 2013-02-14 NOTE — Telephone Encounter (Signed)
Refill request for Fluconazole 150 mg, this came from the pharmacy.

## 2013-02-21 ENCOUNTER — Ambulatory Visit (INDEPENDENT_AMBULATORY_CARE_PROVIDER_SITE_OTHER): Payer: BC Managed Care – PPO | Admitting: Neurology

## 2013-02-21 ENCOUNTER — Encounter: Payer: Self-pay | Admitting: Neurology

## 2013-02-21 VITALS — BP 118/88 | HR 82 | Temp 98.0°F | Ht 69.0 in | Wt 262.0 lb

## 2013-02-21 DIAGNOSIS — G43109 Migraine with aura, not intractable, without status migrainosus: Secondary | ICD-10-CM

## 2013-02-21 DIAGNOSIS — R51 Headache: Secondary | ICD-10-CM

## 2013-02-21 DIAGNOSIS — G4440 Drug-induced headache, not elsewhere classified, not intractable: Secondary | ICD-10-CM

## 2013-02-21 DIAGNOSIS — R519 Headache, unspecified: Secondary | ICD-10-CM

## 2013-02-21 MED ORDER — SUMATRIPTAN SUCCINATE 25 MG PO TABS: ORAL_TABLET | ORAL | Status: DC

## 2013-02-21 MED ORDER — NORTRIPTYLINE HCL 10 MG PO CAPS: 10.0000 mg | ORAL_CAPSULE | Freq: Every day | ORAL | Status: DC

## 2013-02-21 NOTE — Patient Instructions (Signed)
1.  Nortriptyline 10mg  at bedtime daily. Side effects include sleepiness and dizziness.  Call in 3 weeks with update. 2.  For moderate to severe attacks, take Sumatriptan 25mg  at immediate onset of migraine.  May repeat in 2 hours if headache persists or recurs.  Do not take more than 10 days out of the month. 3.  Limit use of pain relievers to no more than 10 days out of the month.  These medications include acetaminophen, ibuprofen, triptans and narcotics.  This will help reduce risk of rebound headaches. 4.  Keep a headache diary. 5.  Stay adequately hydrated. 6.  Maintain good sleep hygiene. 7.  Maintain proper stress management. 8.  Follow up in 3 months.

## 2013-02-21 NOTE — Progress Notes (Signed)
NEUROLOGY CONSULTATION NOTE  [name] MRN: [id number] DOB: 03-28-1976   Referring provider: Dr. Clent Ridges Primary care provider: Dr. Clent Ridges  Reason for consult:  Migraine.  HISTORY OF PRESENT ILLNESS: [name] is a 37 year old right-handed woman with hypertension, GERD, and fibromyalgia who presents for evaluation of migraine.  Records and images were personally reviewed where available.    Onset:  10 years ago Location:  Top of head, sometimes radiating down either side of face to the cheek Quality:  Pressure-like, sometimes pounding Intensity:  4-8/10 Aura:  Severe attacks preceded by scotomas and imbalance, x 30 minutes Associated symptoms:  Nausea, osmophobia, photophobia, phonophobia, hyperacusis, sometimes vomiting with severe ones Duration:  From 2 days to 8 days Frequency:  Almost daily Activity:  Needs to lay down Triggers/exacerbating factors:  Change in weather, stress Relieving factors:  Resting, applying pressure to head  Past abortive therapy:  Ibuprofen (avoids due to daily celebrex), tylenol (somewhat helpful), tried Imitrex shot once (helpful) Past preventative therapy:  topiramate (didn't feel well)  Current abortive therapy:  Phenergan/benadryl combo, followed by hydrocodone in 4 hours, if needed (takes hydrocodone usually for back pain). Current preventative therapy:  none  Frequency of medication use:  Almost daily Alcohol: no Caffeine:  A couple of sodas daily Smoking:  no Sleep hygiene:  Poor sleep (about 3 hours a night) Stress/depression:  Yes, raising her children with husband now out of town often for work Family history of headache:  Aunt with headaches.  Another aunt with headaches and aneurysm rupture.  She saw a neurologist 10 years ago for headache.  MRI performed revealed nonspecific white matter hyperintensities.  LP performed, which was unremarkable.  She presented to the ED on 10/23/12 with prolonged episode, lasting more than 2 days.   She tried norco, which was ineffective.  She was given Phenergan suppositories and discharged.  Her blood pressure hasn't been quite controlled as of late (140s-160s/90s-110s).    She inquired about a repeat MRI of the brain, given that two previous studies revealed white matter changes.  04/30/02 MRI Brain w/wo: nonspecific supratentorial white matter disease. 02/29/04 MRI Brain w/wo: numerous nonspecific punctate and patchy white matter changes in the subcortical and periventricular white matter.  No abnormal enhancement.  Similar to prior exam from 2003. 02/29/04 MRA Head: no large vessel occlusion or aneurysm identified. 07/28/06 MRI Brain w/wo: nonspecific punctate and patchy subcortical white matter hyperintensities in the subcortical and periventricular white matter, stable compared to prior study in 2005.  No abnormal enhancement. 07/28/06 MRA Head: Focal decreased caliber of the anterior cerebral arteries proximally with relative decrease in the caliber of the A1 segments compared to the A2 segments bilaterally.   Mild focal decrease in the caliber of the right middle cerebral artery at its origin.    PAST MEDICAL HISTORY: Past Medical History  Diagnosis Date  . Allergy   . Headache(784.0)   . GERD (gastroesophageal reflux disease)   . Polycystic ovaries   . B12 deficiency   . Peripheral neuropathy   . Fibromyalgia   . Depression   . Migraine     PAST SURGICAL HISTORY: Past Surgical History  Procedure Laterality Date  . Appendectomy    . Abdominal hysterectomy    . Tonsillectomy    . Sinusotomy  12/14/06  . Wrist ganglion excision    . Ovarian cyst removal    . Oophorectomy      left overy  . Rectocele repair    . Bladder tack  2012  . Back surgery  2012    MEDICATIONS: Current Outpatient Prescriptions on File Prior to Visit  Medication Sig Dispense Refill  . ALPRAZolam (XANAX) 0.5 MG tablet Take 1 tablet (0.5 mg total) by mouth 3 (three) times daily as needed.  90  tablet  5  . Alum & Mag Hydroxide-Simeth (GI COCKTAIL) SUSP suspension Take 30 mLs by mouth 3 (three) times daily. Shake well.  300 mL  5  . celecoxib (CELEBREX) 200 MG capsule Take 200 mg by mouth 2 (two) times daily.      . cyclobenzaprine (FLEXERIL) 10 MG tablet Take 10 mg by mouth every 8 (eight) hours as needed. PATIENT TAKES FOR MUSCLE SPASMS       . fluconazole (DIFLUCAN) 150 MG tablet Take 1 tablet (150 mg total) by mouth once.  1 tablet  11  . fluticasone (FLONASE) 50 MCG/ACT nasal spray 2 SPRAYS IN NASAL ROUTE DAILY.  16 g  9  . gabapentin (NEURONTIN) 300 MG capsule Take 300 mg by mouth 2 (two) times daily.       Marland Kitchen HYDROcodone-acetaminophen (NORCO) 7.5-325 MG per tablet TAKE 1 TABLET EVERY 6 HOURS AS NEEDED FOR PAIN  60 tablet  5  . Multiple Vitamins-Minerals (MULTI-VITAMIN GUMMIES PO) Take 1 tablet by mouth daily.        . pantoprazole (PROTONIX) 40 MG tablet Take 1 tablet (40 mg total) by mouth daily.  90 tablet  1  . promethazine (PHENERGAN) 25 MG tablet Take 1 tablet (25 mg total) by mouth every 6 (six) hours as needed for nausea. PATIENT TAKES FOR NAUSEA  60 tablet  5  . phentermine (ADIPEX-P) 37.5 MG tablet TAKE 1 TABLET EVERY DAY  30 tablet  5   No current facility-administered medications on file prior to visit.    ALLERGIES: Allergies  Allergen Reactions  . Gadolinium Derivatives Swelling and Other (See Comments)    Arm swelling, tingling, & redness. Radiologist Dr. Grace Isaac recommends an injection of steroids if patient needs a gadolinium based contrast in the future.    FAMILY HISTORY: Family History  Problem Relation Age of Onset  . Fibromyalgia Mother   . Diabetes Mother   . Cancer Mother     thyrod  . Liver disease Mother   . Neuropathy Mother   . Hypertension Father   . Diabetes Father   . Heart disease Father     SOCIAL HISTORY: History   Social History  . Marital Status: Married    Spouse Name: N/A    Number of Children: N/A  . Years of Education:  N/A   Occupational History  . Not on file.   Social History Main Topics  . Smoking status: Former Smoker    Quit date: 06/01/1998  . Smokeless tobacco: Never Used  . Alcohol Use: No  . Drug Use: No  . Sexual Activity: Yes    Birth Control/ Protection: Surgical   Other Topics Concern  . Not on file   Social History Narrative  . No narrative on file    REVIEW OF SYSTEMS: Constitutional: No fevers, chills, or sweats, no generalized fatigue, change in appetite Eyes: No visual changes, double vision, eye pain Ear, nose and throat: No hearing loss, ear pain, nasal congestion, sore throat Cardiovascular: No chest pain, palpitations Respiratory:  No shortness of breath at rest or with exertion, wheezes GastrointestinaI: No nausea, vomiting, diarrhea, abdominal pain, fecal incontinence Genitourinary:  No dysuria, urinary retention or frequency Musculoskeletal:  No neck pain,  back pain Integumentary: No rash, pruritus, skin lesions Neurological: as above Psychiatric: No depression, insomnia, anxiety Endocrine: No palpitations, fatigue, diaphoresis, mood swings, change in appetite, change in weight, increased thirst Hematologic/Lymphatic:  No anemia, purpura, petechiae. Allergic/Immunologic: no itchy/runny eyes, nasal congestion, recent allergic reactions, rashes  PHYSICAL EXAM: Filed Vitals:   02/21/13 1350  BP: 118/88  Pulse: 82  Temp: 98 F (36.7 C)   General: No acute distress Head:  Normocephalic/atraumatic Neck: supple, no paraspinal tenderness, full range of motion Back: No paraspinal tenderness Heart: regular rate and rhythm Lungs: Clear to auscultation bilaterally. Vascular: No carotid bruits. Neurological Exam: Mental status: alert and oriented to person, place, and time, speech fluent and not dysarthric, language intact. Cranial nerves: CN I: not tested CN II: pupils equal, round and reactive to light, visual fields intact, fundi unremarkable. CN III, IV, VI:   full range of motion, no nystagmus, no ptosis CN V: facial sensation intact CN VII: upper and lower face symmetric CN VIII: hearing intact CN IX, X: gag intact, uvula midline CN XI: sternocleidomastoid and trapezius muscles intact CN XII: tongue midline Bulk & Tone: normal, no fasciculations. Motor: 5/5 throughout Sensation: temperature and vibration intact Deep Tendon Reflexes: 2+ throughout, toes down Finger to nose testing: normal Heel to shin: normal Gait: normal stance, a little off balance when walking in tandem. Romberg with very mild sway.  IMPRESSION & PLAN: Chronic migraines complicated by medication-overuse headaches, abnormal MRI 1.  Will start nortriptyline 10mg  qhs.  Side effects discussed.  Instructed to call in 3 weeks with update (or sooner if needed). 2.  For moderate-severe headaches, sumatriptan 25mg . Refrain from using narcotics to treat headache. 3.  Limit use of pain relievers to no more than 10 days out of the month to prevent rebound 4.  Headache diary 5.  Stay hydrated, stress management, proper sleep hygiene, exercise 6.  Given that the MRI findings are non-specific, and stable among three studies over a 5 year period, I do not think further imaging is warranted. 7.  Follow up in 3 months.  Thank you for allowing me to take part in the care of this patient.  Shon Millet, DO  CC:  Gershon Crane, MD

## 2013-02-22 ENCOUNTER — Ambulatory Visit (INDEPENDENT_AMBULATORY_CARE_PROVIDER_SITE_OTHER): Payer: BC Managed Care – PPO | Admitting: Family Medicine

## 2013-02-22 ENCOUNTER — Encounter: Payer: Self-pay | Admitting: Family Medicine

## 2013-02-22 VITALS — BP 108/84 | HR 110 | Temp 98.6°F | Wt 264.0 lb

## 2013-02-22 DIAGNOSIS — E538 Deficiency of other specified B group vitamins: Secondary | ICD-10-CM

## 2013-02-22 DIAGNOSIS — I10 Essential (primary) hypertension: Secondary | ICD-10-CM

## 2013-02-22 DIAGNOSIS — IMO0001 Reserved for inherently not codable concepts without codable children: Secondary | ICD-10-CM

## 2013-02-22 DIAGNOSIS — G609 Hereditary and idiopathic neuropathy, unspecified: Secondary | ICD-10-CM

## 2013-02-22 DIAGNOSIS — R51 Headache: Secondary | ICD-10-CM

## 2013-02-22 LAB — CBC WITH DIFFERENTIAL/PLATELET
Basophils Absolute: 0 10*3/uL (ref 0.0–0.1)
Basophils Relative: 0.3 % (ref 0.0–3.0)
Eosinophils Absolute: 0.1 10*3/uL (ref 0.0–0.7)
Eosinophils Relative: 1.2 % (ref 0.0–5.0)
HCT: 41.9 % (ref 36.0–46.0)
Hemoglobin: 14.5 g/dL (ref 12.0–15.0)
Lymphocytes Relative: 29.6 % (ref 12.0–46.0)
Lymphs Abs: 2.7 10*3/uL (ref 0.7–4.0)
MCHC: 34.6 g/dL (ref 30.0–36.0)
MCV: 88.8 fl (ref 78.0–100.0)
Monocytes Absolute: 0.5 10*3/uL (ref 0.1–1.0)
Monocytes Relative: 5.6 % (ref 3.0–12.0)
Neutro Abs: 5.9 10*3/uL (ref 1.4–7.7)
Neutrophils Relative %: 63.3 % (ref 43.0–77.0)
Platelets: 309 10*3/uL (ref 150.0–400.0)
RBC: 4.72 Mil/uL (ref 3.87–5.11)
RDW: 13.7 % (ref 11.5–14.6)
WBC: 9.3 10*3/uL (ref 4.5–10.5)

## 2013-02-22 LAB — POCT URINALYSIS DIPSTICK
Bilirubin, UA: NEGATIVE
Blood, UA: NEGATIVE
Glucose, UA: NEGATIVE
Ketones, UA: NEGATIVE
Leukocytes, UA: NEGATIVE
Nitrite, UA: NEGATIVE
Protein, UA: NEGATIVE
Spec Grav, UA: 1.025
Urobilinogen, UA: 0.2
pH, UA: 6

## 2013-02-22 LAB — LIPID PANEL
Cholesterol: 236 mg/dL — ABNORMAL HIGH (ref 0–200)
HDL: 34.4 mg/dL — ABNORMAL LOW (ref >39.00)
Total CHOL/HDL Ratio: 7
Triglycerides: 180 mg/dL — ABNORMAL HIGH (ref 0.0–149.0)
VLDL: 36 mg/dL (ref 0.0–40.0)

## 2013-02-22 LAB — HEPATIC FUNCTION PANEL
ALT: 16 U/L (ref 0–35)
AST: 15 U/L (ref 0–37)
Albumin: 4 g/dL (ref 3.5–5.2)
Alkaline Phosphatase: 45 U/L (ref 39–117)
Bilirubin, Direct: 0.1 mg/dL (ref 0.0–0.3)
Total Bilirubin: 0.6 mg/dL (ref 0.3–1.2)
Total Protein: 7.2 g/dL (ref 6.0–8.3)

## 2013-02-22 LAB — BASIC METABOLIC PANEL
BUN: 13 mg/dL (ref 6–23)
CO2: 32 mEq/L (ref 19–32)
Calcium: 8.7 mg/dL (ref 8.4–10.5)
Chloride: 101 mEq/L (ref 96–112)
Creatinine, Ser: 0.8 mg/dL (ref 0.4–1.2)
GFR: 83.82 mL/min (ref >60.00)
Glucose, Bld: 100 mg/dL — ABNORMAL HIGH (ref 70–99)
Potassium: 3.3 mEq/L — ABNORMAL LOW (ref 3.5–5.1)
Sodium: 140 mEq/L (ref 135–145)

## 2013-02-22 LAB — TSH: TSH: 0.74 u[IU]/mL (ref 0.35–5.50)

## 2013-02-22 LAB — VITAMIN B12: Vitamin B-12: 185 pg/mL — ABNORMAL LOW (ref 211–911)

## 2013-02-22 LAB — LDL CHOLESTEROL, DIRECT: Direct LDL: 175 mg/dL

## 2013-02-22 NOTE — Progress Notes (Signed)
  Subjective:    Patient ID: [name], female    DOB: 1976-02-15, 37 y.o.   MRN: [id number]  HPI Here to follow up on HTN. At our last visit we stopped Lisinorpil and kept her only on HCTZ. She feels better and her BP is stable. She saw Dr. Everlena Cooper yesterday for her migraines and he started her on Nortriptyline.    Review of Systems  Constitutional: Negative.   Respiratory: Negative.   Cardiovascular: Negative.   Neurological: Negative.        Objective:   Physical Exam  Constitutional: She appears well-developed and well-nourished.  Cardiovascular: Normal rate, regular rhythm, normal heart sounds and intact distal pulses.   Pulmonary/Chest: Effort normal and breath sounds normal.          Assessment & Plan:  Her BP is well controlled. Stay on current meds.

## 2013-02-23 LAB — VITAMIN D 25 HYDROXY (VIT D DEFICIENCY, FRACTURES): Vit D, 25-Hydroxy: 30 ng/mL (ref 30–89)

## 2013-02-27 MED ORDER — POTASSIUM CHLORIDE ER 10 MEQ PO TBCR: 10.0000 meq | EXTENDED_RELEASE_TABLET | Freq: Every day | ORAL | Status: DC

## 2013-02-27 NOTE — Addendum Note (Signed)
Addended by: Aniceto Boss A on: 02/27/2013 02:27 PM   Modules accepted: Orders

## 2013-02-27 NOTE — Progress Notes (Signed)
Quick Note:  I spoke with pt and sent script e-scribe. Pt wants to know if she needs to be back on Vitamin B 12 injections and what about some Vitamin D ? ______

## 2013-03-01 MED ORDER — SYRINGE DISPOSABLE 3 ML MISC: Status: DC

## 2013-03-01 MED ORDER — CYANOCOBALAMIN 1000 MCG/ML IJ SOLN: 1000.0000 ug | INTRAMUSCULAR | Status: DC

## 2013-03-01 NOTE — Progress Notes (Signed)
Quick Note:  I spoke with pt and sent script e-scribe & also for syringes. ______

## 2013-03-01 NOTE — Addendum Note (Signed)
Addended by: Aniceto Boss A on: 03/01/2013 04:28 PM   Modules accepted: Orders

## 2013-03-23 ENCOUNTER — Telehealth: Payer: Self-pay | Admitting: Family Medicine

## 2013-03-23 MED ORDER — HYDROCODONE-ACETAMINOPHEN 7.5-325 MG PO TABS: ORAL_TABLET | ORAL | Status: DC

## 2013-03-23 NOTE — Telephone Encounter (Signed)
Pt needs new rx norco

## 2013-03-23 NOTE — Telephone Encounter (Signed)
done

## 2013-03-24 NOTE — Telephone Encounter (Signed)
Script is ready for pick up and I spoke with pt.  

## 2013-05-29 ENCOUNTER — Ambulatory Visit: Payer: BC Managed Care – PPO | Admitting: Neurology

## 2013-06-06 ENCOUNTER — Encounter: Payer: Self-pay | Admitting: Neurology

## 2013-06-06 ENCOUNTER — Ambulatory Visit (INDEPENDENT_AMBULATORY_CARE_PROVIDER_SITE_OTHER): Payer: BC Managed Care – PPO | Admitting: Neurology

## 2013-06-06 VITALS — BP 120/80 | HR 86 | Temp 97.7°F | Ht 69.0 in | Wt 269.0 lb

## 2013-06-06 DIAGNOSIS — G43709 Chronic migraine without aura, not intractable, without status migrainosus: Secondary | ICD-10-CM

## 2013-06-06 MED ORDER — NORTRIPTYLINE HCL 25 MG PO CAPS: 25.0000 mg | ORAL_CAPSULE | Freq: Every day | ORAL | Status: DC

## 2013-06-06 NOTE — Progress Notes (Signed)
NEUROLOGY FOLLOW UP OFFICE NOTE  [name] [id number]  HISTORY OF PRESENT ILLNESS: [name] is a 38 year old right-handed woman with hypertension, GERD, and fibromyalgia who presents for chronic migraine and medication-overuse headaches.  Records and images were personally reviewed where available.    Onset:  10 years ago Location:  Top of head, sometimes radiating down either side of face to the cheek Quality:  Pressure-like, sometimes pounding Intensity:  4-8/10 Aura:  Severe attacks preceded by scotomas and imbalance, x 30 minutes Associated symptoms:  Nausea, osmophobia, photophobia, phonophobia, hyperacusis, sometimes vomiting with severe ones Duration:  Was 2-8 days.  Now:  1-2 days. Frequency:  Almost daily.  Now, she has about 5-7 headache-free days a month.  Half of headache days are migraine. Activity:  Needs to lay down Triggers/exacerbating factors:  Change in weather, stress Relieving factors:  Resting, applying pressure to head  Past abortive therapy:  Ibuprofen (avoids due to celebrex), tylenol (somewhat helpful), tried Imitrex shot once (helpfu but afraid to administer) Past preventative therapy:  topiramate (didn't feel well)  Current abortive therapy:  sumatriptan 25mg  (helps but does not abort completely and does not always take at earliest onset because concerned about taking it with hydrocodone which she takes for back pain).  Phenergan Current preventative therapy:  nortriptyline 10mg  daily.  Concerned about weight gain. Other medications include hydrocodone and celebrex for back pain (no longer takes celebrex every day).  Alcohol: no Caffeine:  A couple of sodas daily Smoking:  no Sleep hygiene:  Poor sleep (about 3 hours a night) Stress/depression:  Yes, raising her children with husband now out of town often for work Family history of headache:  Aunt with headaches.  Another aunt with headaches and aneurysm rupture.  History of abnormal brain  MRIs with nonspecific punctate and patchy white matter hyperintensities, stable over 5 years. 04/30/02 MRI Brain w/wo: nonspecific supratentorial white matter disease. 02/29/04 MRI Brain w/wo: numerous nonspecific punctate and patchy white matter changes in the subcortical and periventricular white matter.  No abnormal enhancement. Similar to prior exam from 2003. 02/29/04 MRA Head: no large vessel occlusion or aneurysm identified. 07/28/06 MRI Brain w/wo: nonspecific punctate and patchy subcortical white matter hyperintensities in the subcortical and periventricular white matter, stable compared to prior study in 2005.  No abnormal enhancement. 07/28/06 MRA Head: Focal decreased caliber of the anterior cerebral arteries proximally with relative decrease in the caliber of the A1 segments compared to the A2 segments bilaterally.   Mild focal decrease in the caliber of the right middle cerebral artery at its origin.  PAST MEDICAL HISTORY: Past Medical History  Diagnosis Date  . Allergy   . Headache(784.0)   . GERD (gastroesophageal reflux disease)   . Polycystic ovaries   . B12 deficiency   . Peripheral neuropathy   . Fibromyalgia   . Depression   . Migraine   . Anxiety     MEDICATIONS: Current Outpatient Prescriptions on File Prior to Visit  Medication Sig Dispense Refill  . ALPRAZolam (XANAX) 0.5 MG tablet Take 1 tablet (0.5 mg total) by mouth 3 (three) times daily as needed.  90 tablet  5  . Alum & Mag Hydroxide-Simeth (GI COCKTAIL) SUSP suspension Take 30 mLs by mouth 3 (three) times daily. Shake well.  300 mL  5  . celecoxib (CELEBREX) 200 MG capsule Take 200 mg by mouth 2 (two) times daily.      . cyanocobalamin (,VITAMIN B-12,) 1000 MCG/ML injection Inject 1 mL (  1,000 mcg total) into the muscle every 30 (thirty) days.  3 mL  1  . cyclobenzaprine (FLEXERIL) 10 MG tablet Take 10 mg by mouth every 8 (eight) hours as needed. PATIENT TAKES FOR MUSCLE SPASMS       . fluconazole (DIFLUCAN)  150 MG tablet Take 1 tablet (150 mg total) by mouth once.  1 tablet  11  . fluticasone (FLONASE) 50 MCG/ACT nasal spray 2 SPRAYS IN NASAL ROUTE DAILY.  16 g  9  . gabapentin (NEURONTIN) 300 MG capsule Take 300 mg by mouth 2 (two) times daily.       . hydrochlorothiazide (HYDRODIURIL) 25 MG tablet Take 25 mg by mouth daily.       Marland Kitchen HYDROcodone-acetaminophen (NORCO) 7.5-325 MG per tablet TAKE 1 TABLET EVERY 6 HOURS AS NEEDED FOR PAIN  60 tablet  0  . Multiple Vitamins-Minerals (MULTI-VITAMIN GUMMIES PO) Take 1 tablet by mouth daily.        . pantoprazole (PROTONIX) 40 MG tablet Take 1 tablet (40 mg total) by mouth daily.  90 tablet  1  . phentermine (ADIPEX-P) 37.5 MG tablet TAKE 1 TABLET EVERY DAY  30 tablet  5  . potassium chloride (KLOR-CON 10) 10 MEQ tablet Take 1 tablet (10 mEq total) by mouth daily.  30 tablet  11  . promethazine (PHENERGAN) 25 MG tablet Take 1 tablet (25 mg total) by mouth every 6 (six) hours as needed for nausea. PATIENT TAKES FOR NAUSEA  60 tablet  5  . SUMAtriptan (IMITREX) 25 MG tablet Take 1 pill at immediate onset of headache.  May repeat in 2 hours x1 if headache persists or recurs.  9 tablet  3  . SYRINGE DISPOSABLE 3CC 3 ML MISC Inject 1 ml once a month intramuscular  3 each  1   No current facility-administered medications on file prior to visit.    ALLERGIES: Allergies  Allergen Reactions  . Gadolinium Derivatives Swelling and Other (See Comments)    Arm swelling, tingling, & redness. Radiologist Dr. Grace Isaac recommends an injection of steroids if patient needs a gadolinium based contrast in the future.    FAMILY HISTORY: Family History  Problem Relation Age of Onset  . Fibromyalgia Mother   . Diabetes Mother   . Cancer Mother     thyrod  . Liver disease Mother   . Neuropathy Mother   . Hypertension Father   . Diabetes Father   . Heart disease Father     SOCIAL HISTORY: History   Social History  . Marital Status: Married    Spouse Name: N/A     Number of Children: N/A  . Years of Education: N/A   Occupational History  . Not on file.   Social History Main Topics  . Smoking status: Former Smoker    Quit date: 06/01/1998  . Smokeless tobacco: Never Used  . Alcohol Use: No  . Drug Use: No  . Sexual Activity: Yes    Birth Control/ Protection: Surgical   Other Topics Concern  . Not on file   Social History Narrative  . No narrative on file    REVIEW OF SYSTEMS: Constitutional: No fevers, chills, or sweats, no generalized fatigue, change in appetite Eyes: No visual changes, double vision, eye pain Ear, nose and throat: No hearing loss, ear pain, nasal congestion, sore throat Cardiovascular: No chest pain, palpitations Respiratory:  No shortness of breath at rest or with exertion, wheezes GastrointestinaI: No nausea, vomiting, diarrhea, abdominal pain, fecal incontinence Genitourinary:  No dysuria, urinary retention or frequency Musculoskeletal:  No neck pain, back pain Integumentary: No rash, pruritus, skin lesions Neurological: as above Psychiatric: No depression, insomnia, anxiety Endocrine: No palpitations, fatigue, diaphoresis, mood swings, change in appetite, change in weight, increased thirst Hematologic/Lymphatic:  No anemia, purpura, petechiae. Allergic/Immunologic: no itchy/runny eyes, nasal congestion, recent allergic reactions, rashes  PHYSICAL EXAM: Filed Vitals:   06/06/13 1110  BP: 120/80  Pulse: 86  Temp: 97.7 F (36.5 C)   General: No acute distress Head:  Normocephalic/atraumatic  IMPRESSION: Chronic migraine  PLAN: 1.  Will increase nortriptyline to 25mg  at bedtime.  Not sure if weight gain is related to nortriptyline or due to having just had the holidays.  Also, she reports that she was gaining weight prior to starting nortriptyline. 2.  Take imitrex at earliest onset and repeat in 2 hours if needed.  If in middle of migraine, consider naproxen (if haven't taken a celebrex that day) 3.   Must discuss with Dr. Clent RidgesFry regarding addressing stress and anxiety as well as insomnia, as they contribute to headaches. 4.  Exercise and balanced diet. 5.  Follow up in 3 months.  30 minutes spent with patient, 100% spent counseling and coordinating care.  Shon MilletAdam Justan Gaede, DO  CC:  Gershon CraneStephen Fry, MD

## 2013-06-06 NOTE — Patient Instructions (Addendum)
Migraine Recommendations: 1.  Take the Imitrex at immediate onset of migraine.  May repeat in 2 hours if needed.  If in middle of migraine, try taking with naproxen 2.  Limit use of pain relievers to no more than 2 to 3 days out of the week.  These medications include acetaminophen, ibuprofen, triptans and narcotics.  This will help reduce risk of rebound headaches. 3.  Keep a headache diary. 4.  Stay adequately hydrated. 5.  Maintain good sleep hygiene. 6.  Maintain proper stress management. 7.  Increase nortriptyline to 25mg  at bedtime. 8.  Call in 4 weeks with update and follow up in 3 months.

## 2013-06-20 ENCOUNTER — Telehealth: Payer: Self-pay | Admitting: Family Medicine

## 2013-06-20 MED ORDER — HYDROCODONE-ACETAMINOPHEN 7.5-325 MG PO TABS
ORAL_TABLET | ORAL | Status: DC
Start: 2013-06-20 — End: 2013-07-21

## 2013-06-20 NOTE — Telephone Encounter (Signed)
Pt needs new rx hydrocodone °

## 2013-06-20 NOTE — Telephone Encounter (Signed)
Done for one month only, she needs testing and a contract  

## 2013-06-22 NOTE — Telephone Encounter (Signed)
Script is ready for pick up, contract printed and I spoke with pt. 

## 2013-07-17 ENCOUNTER — Encounter: Payer: Self-pay | Admitting: Family Medicine

## 2013-07-21 ENCOUNTER — Telehealth: Payer: Self-pay | Admitting: Family Medicine

## 2013-07-21 MED ORDER — HYDROCODONE-ACETAMINOPHEN 7.5-325 MG PO TABS: ORAL_TABLET | ORAL | Status: DC

## 2013-07-21 NOTE — Telephone Encounter (Signed)
Done for 3 months

## 2013-07-21 NOTE — Telephone Encounter (Signed)
Pt needs new rx hydrocodone °

## 2013-07-21 NOTE — Telephone Encounter (Signed)
Script is ready for pick up and I left a voice message for pt. 

## 2013-07-24 ENCOUNTER — Other Ambulatory Visit: Payer: Self-pay | Admitting: Family Medicine

## 2013-07-24 NOTE — Telephone Encounter (Signed)
Can we refill this? 

## 2013-08-09 ENCOUNTER — Ambulatory Visit (INDEPENDENT_AMBULATORY_CARE_PROVIDER_SITE_OTHER): Payer: BC Managed Care – PPO | Admitting: Family Medicine

## 2013-08-09 ENCOUNTER — Encounter: Payer: Self-pay | Admitting: Family Medicine

## 2013-08-09 VITALS — BP 120/88 | HR 130 | Temp 99.4°F | Ht 69.0 in | Wt 274.0 lb

## 2013-08-09 DIAGNOSIS — IMO0002 Reserved for concepts with insufficient information to code with codable children: Secondary | ICD-10-CM

## 2013-08-09 DIAGNOSIS — E538 Deficiency of other specified B group vitamins: Secondary | ICD-10-CM

## 2013-08-09 DIAGNOSIS — R51 Headache: Secondary | ICD-10-CM

## 2013-08-09 DIAGNOSIS — M1710 Unilateral primary osteoarthritis, unspecified knee: Secondary | ICD-10-CM

## 2013-08-09 DIAGNOSIS — G609 Hereditary and idiopathic neuropathy, unspecified: Secondary | ICD-10-CM

## 2013-08-09 MED ORDER — GABAPENTIN 300 MG PO CAPS: 300.0000 mg | ORAL_CAPSULE | Freq: Three times a day (TID) | ORAL | Status: DC

## 2013-08-09 MED ORDER — CYANOCOBALAMIN 1000 MCG/ML IJ SOLN: 1000.0000 ug | INTRAMUSCULAR | Status: DC

## 2013-08-09 MED ORDER — GI COCKTAIL ~~LOC~~: 30.0000 mL | Freq: Three times a day (TID) | ORAL | Status: DC

## 2013-08-09 NOTE — Progress Notes (Signed)
Pre visit review using our clinic review tool, if applicable. No additional management support is needed unless otherwise documented below in the visit note. 

## 2013-08-09 NOTE — Progress Notes (Signed)
   Subjective:    Patient ID: [name], female    DOB: 12/26/1975, 38 y.o.   MRN: [id number]  HPI Here for med refills. She is doing well in general. She had been seeing Dr. Phoebe PerchHirsch for her chronic back pain and he had supplied her with Flexeril, Gabapentin, and Celebrex. He recently moved out of state and she asks if we can take over prescribing these.    Review of Systems  Constitutional: Negative.   Musculoskeletal: Positive for back pain.  Neurological: Positive for headaches.       Objective:   Physical Exam  Constitutional: She appears well-developed and well-nourished.  Cardiovascular: Normal rate, regular rhythm, normal heart sounds and intact distal pulses.   Pulmonary/Chest: Effort normal and breath sounds normal.  Musculoskeletal: Normal range of motion. She exhibits no edema and no tenderness.          Assessment & Plan:  We will stay on her current regimen. Meds were refilled.

## 2013-08-10 ENCOUNTER — Telehealth: Payer: Self-pay | Admitting: Family Medicine

## 2013-08-10 NOTE — Telephone Encounter (Signed)
[facility] called and needs the ratio for GI cocktail. I will call the [facility] to see if we can get this.

## 2013-08-10 NOTE — Telephone Encounter (Signed)
I called [facility] and was given the standard hospital ratio, which is Malox= 2 part, Vicous Lidocaine= 2 part, Donnatal= 1 part. [facility] called [facility] and gave this information.

## 2013-08-17 ENCOUNTER — Telehealth: Payer: Self-pay | Admitting: Family Medicine

## 2013-08-17 ENCOUNTER — Telehealth: Payer: Self-pay | Admitting: Neurology

## 2013-08-17 NOTE — Telephone Encounter (Signed)
PT STATES THAT SAME PT WOULD LIKE TO SWITCH TO TOPAMAX IF THAT  IT IS OK IN FEB SHE STATES THAT SHE HAD 21DAYS OF  MIGRAINES PLEASE CALL  [phone]

## 2013-08-17 NOTE — Telephone Encounter (Signed)
Pt was seen last week and stated she forgot to ask dr. Clent RidgesFry if she can start taking it again phentermine (ADIPEX-P) 37.5 MG tablet.

## 2013-08-18 MED ORDER — PHENTERMINE HCL 37.5 MG PO TABS: ORAL_TABLET | ORAL | Status: DC

## 2013-08-18 NOTE — Telephone Encounter (Signed)
I don't recommend that.  She had a positive response on the nortripyline.  Just one bad month shouldn't change that, particularly since we haven't optimized the dose.  I would increase to 50mg  at bedtime and get an update in 3 weeks.

## 2013-08-18 NOTE — Telephone Encounter (Signed)
Rx called in and patient is aware 

## 2013-08-18 NOTE — Telephone Encounter (Signed)
Please advise 

## 2013-08-18 NOTE — Telephone Encounter (Signed)
Yes, call in #[phone] with 5 rf

## 2013-08-18 NOTE — Telephone Encounter (Signed)
Left message for patient to call office regarding medication

## 2013-08-30 ENCOUNTER — Telehealth: Payer: Self-pay | Admitting: Family Medicine

## 2013-08-30 NOTE — Telephone Encounter (Signed)
Call in Levaquin 500 mg daily for 10 days  

## 2013-08-30 NOTE — Telephone Encounter (Signed)
Pt states she was in on 3/11 and was begining to feel like she had sinus issue. Dr fry told her to cb if this developed into anything. Pt states she would like the levofloxacin (LEVAQUIN) 500 MG tablet called into pharm [facility]: [facility]

## 2013-08-31 MED ORDER — LEVOFLOXACIN 500 MG PO TABS: 500.0000 mg | ORAL_TABLET | Freq: Every day | ORAL | Status: DC

## 2013-08-31 NOTE — Telephone Encounter (Signed)
I sent script e-scribe and spoke with pt. 

## 2013-09-05 ENCOUNTER — Ambulatory Visit: Payer: BC Managed Care – PPO | Admitting: Neurology

## 2013-09-12 ENCOUNTER — Ambulatory Visit (INDEPENDENT_AMBULATORY_CARE_PROVIDER_SITE_OTHER): Payer: BC Managed Care – PPO | Admitting: Neurology

## 2013-09-12 ENCOUNTER — Encounter: Payer: Self-pay | Admitting: Neurology

## 2013-09-12 VITALS — BP 124/70 | HR 78 | Temp 97.9°F | Resp 18 | Ht 70.0 in | Wt 265.8 lb

## 2013-09-12 DIAGNOSIS — G43709 Chronic migraine without aura, not intractable, without status migrainosus: Secondary | ICD-10-CM

## 2013-09-12 MED ORDER — NORTRIPTYLINE HCL 50 MG PO CAPS: 50.0000 mg | ORAL_CAPSULE | Freq: Every day | ORAL | Status: DC

## 2013-09-12 NOTE — Patient Instructions (Signed)
1.  Start nortriptyline 50mg  at bedtime.  Call in 4 weeks with update. 2.  For your next migraine, take the last 2 sumatriptan together at earliest onset of headache.  If the spots mean that you will have a headache, then take it then.  If ineffective, call and I will prescribe sumatriptan 100mg  3.  Limit use of pain relievers to no more than 2 days out of month.  If possible, use hydrocodone less. 4.  Refer to Biofeedback 5.  Follow up in 3 months.

## 2013-09-12 NOTE — Progress Notes (Signed)
NEUROLOGY FOLLOW UP OFFICE NOTE  [name] [id number]  HISTORY OF PRESENT ILLNESS: [name] is a 38 year old right-handed woman with hypertension, GERD, and fibromyalgia who presents for chronic migraine and medication-overuse headaches.  Records and images were personally reviewed where available.    UPDATE: Intensity:  4-8/10 Duration:  1-2 days (7 days once last month) Frequency:  25 headache days  Current abortive therapy:  sumatriptan 25mg  (takes when headache begins but not when aura begins).  Takes with phenergan and benadryl Current preventative therapy:  nortriptyline 25mg  (was supposed to increase to 50mg  last month but didn't get the message) Other medications:  For arthritic pain, takes Celebrex and hydrocodone 3 to 4 days out of the week.  HISTORY: Onset:  10 years ago Location:  Top of head, sometimes radiating down either side of face to the cheek Quality:  Pressure-like, sometimes pounding Initial intensity:  4-8/10 Aura:  Severe attacks preceded by scotomas and imbalance, x 30 minutes Associated symptoms:  Nausea, osmophobia, photophobia, phonophobia, hyperacusis, sometimes vomiting with severe ones Initial Duration:  Was 2-8 days.  Now:  1-2 days. Initial Frequency:  Almost daily.  Now, she has about 5-7 headache-free days a month.  Half of headache days are migraine. Activity:  Needs to lay down Triggers/exacerbating factors:  Change in weather, stress, oranges, hot dogs Relieving factors:  Resting, applying pressure to head   Past abortive therapy:  Ibuprofen (avoids due to celebrex), tylenol (somewhat helpful), tried Imitrex shot once (helpfu but afraid to administer) Past preventative therapy:  topiramate (didn't feel well) Other medications include hydrocodone and celebrex for back pain (no longer takes celebrex every day).  Alcohol: no Caffeine:  A couple of sodas daily Smoking:  no Sleep hygiene:  Poor sleep (about 3 hours a  night) Stress/depression:  Yes, raising her children with husband now out of town often for work Family history of headache:  Aunt with headaches.  Another aunt with headaches and aneurysm rupture.  History of abnormal brain MRIs with nonspecific punctate and patchy white matter hyperintensities, stable over 5 years. 04/30/02 MRI Brain w/wo: nonspecific supratentorial white matter disease. 02/29/04 MRI Brain w/wo: numerous nonspecific punctate and patchy white matter changes in the subcortical and periventricular white matter.  No abnormal enhancement. Similar to prior exam from 2003. 02/29/04 MRA Head: no large vessel occlusion or aneurysm identified. 07/28/06 MRI Brain w/wo: nonspecific punctate and patchy subcortical white matter hyperintensities in the subcortical and periventricular white matter, stable compared to prior study in 2005.  No abnormal enhancement. 07/28/06 MRA Head: Focal decreased caliber of the anterior cerebral arteries proximally with relative decrease in the caliber of the A1 segments compared to the A2 segments bilaterally.   Mild focal decrease in the caliber of the right middle cerebral artery at its origin.  PAST MEDICAL HISTORY: Past Medical History  Diagnosis Date  . Allergy   . Headache(784.0)   . GERD (gastroesophageal reflux disease)   . Polycystic ovaries   . B12 deficiency   . Peripheral neuropathy   . Fibromyalgia   . Depression   . Migraine   . Anxiety     MEDICATIONS: Current Outpatient Prescriptions on File Prior to Visit  Medication Sig Dispense Refill  . ALPRAZolam (XANAX) 0.5 MG tablet Take 1 tablet (0.5 mg total) by mouth 3 (three) times daily as needed.  90 tablet  5  . Alum & Mag Hydroxide-Simeth (GI COCKTAIL) SUSP suspension Take 30 mLs by mouth 3 (three) times daily. Shake  well.  300 mL  5  . celecoxib (CELEBREX) 200 MG capsule Take 200 mg by mouth 2 (two) times daily.      . cyanocobalamin (,VITAMIN B-12,) 1000 MCG/ML injection Inject 1 mL  (1,000 mcg total) into the muscle every 30 (thirty) days.  3 mL  5  . cyclobenzaprine (FLEXERIL) 10 MG tablet Take 10 mg by mouth every 8 (eight) hours as needed. PATIENT TAKES FOR MUSCLE SPASMS       . fluconazole (DIFLUCAN) 150 MG tablet TAKE AS DIRECTED  1 tablet  11  . fluticasone (FLONASE) 50 MCG/ACT nasal spray 2 SPRAYS IN NASAL ROUTE DAILY.  16 g  9  . gabapentin (NEURONTIN) 300 MG capsule Take 1 capsule (300 mg total) by mouth 3 (three) times daily.  90 capsule  5  . hydrochlorothiazide (HYDRODIURIL) 25 MG tablet Take 25 mg by mouth daily.       Marland Kitchen. HYDROcodone-acetaminophen (NORCO) 7.5-325 MG per tablet TAKE 1 TABLET EVERY 6 HOURS AS NEEDED FOR PAIN  60 tablet  0  . levofloxacin (LEVAQUIN) 500 MG tablet Take 1 tablet (500 mg total) by mouth daily.  10 tablet  0  . Multiple Vitamins-Minerals (MULTI-VITAMIN GUMMIES PO) Take 1 tablet by mouth daily.        . pantoprazole (PROTONIX) 40 MG tablet Take 1 tablet (40 mg total) by mouth daily.  90 tablet  1  . phentermine (ADIPEX-P) 37.5 MG tablet TAKE 1 TABLET EVERY DAY  30 tablet  5  . potassium chloride (KLOR-CON 10) 10 MEQ tablet Take 1 tablet (10 mEq total) by mouth daily.  30 tablet  11  . promethazine (PHENERGAN) 25 MG tablet Take 1 tablet (25 mg total) by mouth every 6 (six) hours as needed for nausea. PATIENT TAKES FOR NAUSEA  60 tablet  5  . SUMAtriptan (IMITREX) 25 MG tablet Take 1 pill at immediate onset of headache.  May repeat in 2 hours x1 if headache persists or recurs.  9 tablet  3  . SYRINGE DISPOSABLE 3CC 3 ML MISC Inject 1 ml once a month intramuscular  3 each  1   No current facility-administered medications on file prior to visit.    ALLERGIES: Allergies  Allergen Reactions  . Gadolinium Derivatives Swelling and Other (See Comments)    Arm swelling, tingling, & redness. Radiologist Dr. Grace IsaacWatts recommends an injection of steroids if patient needs a gadolinium based contrast in the future.    FAMILY HISTORY: Family History   Problem Relation Age of Onset  . Fibromyalgia Mother   . Diabetes Mother   . Cancer Mother     thyrod  . Liver disease Mother   . Neuropathy Mother   . Hypertension Father   . Diabetes Father   . Heart disease Father     SOCIAL HISTORY: History   Social History  . Marital Status: Married    Spouse Name: N/A    Number of Children: N/A  . Years of Education: N/A   Occupational History  . Not on file.   Social History Main Topics  . Smoking status: Former Smoker    Quit date: 06/01/1998  . Smokeless tobacco: Never Used  . Alcohol Use: No  . Drug Use: No  . Sexual Activity: Yes    Birth Control/ Protection: Surgical   Other Topics Concern  . Not on file   Social History Narrative  . No narrative on file    REVIEW OF SYSTEMS: Constitutional: No fevers, chills, or  sweats, no generalized fatigue, change in appetite Eyes: No visual changes, double vision, eye pain Ear, nose and throat: No hearing loss, ear pain, nasal congestion, sore throat Cardiovascular: No chest pain, palpitations Respiratory:  No shortness of breath at rest or with exertion, wheezes GastrointestinaI: No nausea, vomiting, diarrhea, abdominal pain, fecal incontinence Genitourinary:  No dysuria, urinary retention or frequency Musculoskeletal:  No neck pain, back pain Integumentary: No rash, pruritus, skin lesions Neurological: as above Psychiatric: No depression, insomnia, anxiety Endocrine: No palpitations, fatigue, diaphoresis, mood swings, change in appetite, change in weight, increased thirst Hematologic/Lymphatic:  No anemia, purpura, petechiae. Allergic/Immunologic: no itchy/runny eyes, nasal congestion, recent allergic reactions, rashes  PHYSICAL EXAM: Filed Vitals:   09/12/13 1137  BP: 124/70  Pulse: 78  Temp: 97.9 F (36.6 C)  Resp: 18   General: No acute distress Head:  Normocephalic/atraumatic  IMPRESSION: Transformed migraine  PLAN: 1.  Increase nortriptyline to 50mg  at  bedtime.  Call in 4 weeks with update. 2.  Take 50mg  sumatriptan when get visual aura.  If ineffective, call and we can prescribe sumatriptan 100mg  3.  Try to limit use of all pain relievers to no more than 2 days out of the week (try using hydrocodone less if possible). 4.  Refer for Biofeedback 5.  Follow up in 3 months.  30 minutes spent with patient, over 50% spent counseling and coordinating care.  Shon Millet, DO  CC:  Gershon Crane, MD

## 2013-09-26 ENCOUNTER — Other Ambulatory Visit: Payer: Self-pay | Admitting: Family Medicine

## 2013-10-12 ENCOUNTER — Encounter: Payer: Self-pay | Admitting: Family Medicine

## 2013-10-12 ENCOUNTER — Ambulatory Visit (INDEPENDENT_AMBULATORY_CARE_PROVIDER_SITE_OTHER): Payer: BC Managed Care – PPO | Admitting: Family Medicine

## 2013-10-12 VITALS — BP 115/97 | HR 99 | Temp 98.4°F | Ht 70.0 in | Wt 262.0 lb

## 2013-10-12 DIAGNOSIS — R51 Headache: Secondary | ICD-10-CM

## 2013-10-12 DIAGNOSIS — F3289 Other specified depressive episodes: Secondary | ICD-10-CM

## 2013-10-12 DIAGNOSIS — E538 Deficiency of other specified B group vitamins: Secondary | ICD-10-CM

## 2013-10-12 DIAGNOSIS — IMO0001 Reserved for inherently not codable concepts without codable children: Secondary | ICD-10-CM

## 2013-10-12 DIAGNOSIS — R739 Hyperglycemia, unspecified: Secondary | ICD-10-CM

## 2013-10-12 DIAGNOSIS — F329 Major depressive disorder, single episode, unspecified: Secondary | ICD-10-CM

## 2013-10-12 DIAGNOSIS — R7309 Other abnormal glucose: Secondary | ICD-10-CM

## 2013-10-12 LAB — HEMOGLOBIN A1C: Hgb A1c MFr Bld: 5.4 % (ref 4.6–6.5)

## 2013-10-12 MED ORDER — PANTOPRAZOLE SODIUM 40 MG PO TBEC: 40.0000 mg | DELAYED_RELEASE_TABLET | Freq: Two times a day (BID) | ORAL | Status: DC

## 2013-10-12 MED ORDER — CYANOCOBALAMIN 1000 MCG/ML IJ SOLN: 1000.0000 ug | INTRAMUSCULAR | Status: DC

## 2013-10-12 MED ORDER — PROMETHAZINE HCL 25 MG PO TABS: 25.0000 mg | ORAL_TABLET | Freq: Four times a day (QID) | ORAL | Status: DC | PRN

## 2013-10-12 MED ORDER — CYANOCOBALAMIN 1000 MCG/ML IJ SOLN
1000.0000 ug | Freq: Once | INTRAMUSCULAR | Status: AC
  Administered 2013-10-12: 1000 ug via INTRAMUSCULAR

## 2013-10-12 NOTE — Progress Notes (Signed)
Pre visit review using our clinic review tool, if applicable. No additional management support is needed unless otherwise documented below in the visit note. 

## 2013-10-13 ENCOUNTER — Telehealth: Payer: Self-pay | Admitting: Neurology

## 2013-10-13 ENCOUNTER — Telehealth: Payer: Self-pay | Admitting: Family Medicine

## 2013-10-13 NOTE — Telephone Encounter (Signed)
RX for Nortriptyline 50 mg # 30 1 HS called to [facility] [address]

## 2013-10-13 NOTE — Progress Notes (Signed)
   Subjective:    Patient ID: [name], female    DOB: 03/25/1976, 38 y.o.   MRN: [id number]  HPI Here to discuss overall fatigue for the past few months. She gets plenty of sleep but feels tired all the time. She has had borderline glucoses in the past and she asks if we can rule out diabetes as a problem. No other specific complaints. She had been giving herself monthly B12 shots but she stopped this 4 months ago thinking she may not need them any more.    Review of Systems  Constitutional: Positive for fatigue. Negative for activity change, appetite change and unexpected weight change.  Respiratory: Negative.   Cardiovascular: Negative.        Objective:   Physical Exam  Constitutional: She is oriented to person, place, and time. She appears well-developed and well-nourished.  Neck: No thyromegaly present.  Cardiovascular: Normal rate, regular rhythm, normal heart sounds and intact distal pulses.   Pulmonary/Chest: Effort normal and breath sounds normal.  Neurological: She is alert and oriented to person, place, and time.          Assessment & Plan:  I think her main problem is stopping the B2 shots. She will resume the B12 shots but will take these once a week for the next 12 weeks, then we will check a level. Get an A1c today

## 2013-10-13 NOTE — Telephone Encounter (Signed)
Pt states that she is doing better but still has headache but they are better but does not want to go up on the mg she wants to stay where she is at [phone]  She needs a rx for the nortiptyline 50mg  [facility] [address]

## 2013-10-13 NOTE — Telephone Encounter (Signed)
I left a voice message with results. 

## 2013-10-13 NOTE — Telephone Encounter (Signed)
pls call pt w/ lab results done yesterday

## 2013-10-18 ENCOUNTER — Telehealth: Payer: Self-pay | Admitting: Family Medicine

## 2013-10-18 MED ORDER — SYRINGE DISPOSABLE 3 ML MISC: Status: DC

## 2013-10-18 MED ORDER — HYDROCODONE-ACETAMINOPHEN 7.5-325 MG PO TABS: ORAL_TABLET | ORAL | Status: DC

## 2013-10-18 NOTE — Telephone Encounter (Signed)
I spoke with pt, scripts are ready for pick up and I sent sent e-scribe for syringes.

## 2013-10-18 NOTE — Telephone Encounter (Signed)
done

## 2013-10-18 NOTE — Telephone Encounter (Signed)
Pt is needing new rx HYDROcodone-acetaminophen (NORCO) 7.5-325 MG per tablet, please call when available for pick up. Also pt states that the pharmacy informed her that she needs an rx for the needles to start taking the b12 shots.

## 2013-10-19 ENCOUNTER — Other Ambulatory Visit: Payer: Self-pay | Admitting: *Deleted

## 2013-10-19 MED ORDER — "SYRINGE/NEEDLE (DISP) 25G X 1"" 3 ML MISC": 1.0000 | Status: DC

## 2013-10-21 ENCOUNTER — Encounter (HOSPITAL_BASED_OUTPATIENT_CLINIC_OR_DEPARTMENT_OTHER): Payer: Self-pay | Admitting: Emergency Medicine

## 2013-10-21 ENCOUNTER — Emergency Department (HOSPITAL_BASED_OUTPATIENT_CLINIC_OR_DEPARTMENT_OTHER)
Admission: EM | Admit: 2013-10-21 | Discharge: 2013-10-21 | Disposition: A | Discharge status: Home / Self Care | Payer: BC Managed Care – PPO | Attending: Emergency Medicine | Admitting: Emergency Medicine

## 2013-10-21 DIAGNOSIS — IMO0002 Reserved for concepts with insufficient information to code with codable children: Secondary | ICD-10-CM | POA: Insufficient documentation

## 2013-10-21 DIAGNOSIS — IMO0001 Reserved for inherently not codable concepts without codable children: Secondary | ICD-10-CM | POA: Insufficient documentation

## 2013-10-21 DIAGNOSIS — Z791 Long term (current) use of non-steroidal anti-inflammatories (NSAID): Secondary | ICD-10-CM | POA: Insufficient documentation

## 2013-10-21 DIAGNOSIS — M545 Low back pain, unspecified: Secondary | ICD-10-CM | POA: Insufficient documentation

## 2013-10-21 DIAGNOSIS — M549 Dorsalgia, unspecified: Secondary | ICD-10-CM

## 2013-10-21 DIAGNOSIS — F3289 Other specified depressive episodes: Secondary | ICD-10-CM | POA: Insufficient documentation

## 2013-10-21 DIAGNOSIS — Z87891 Personal history of nicotine dependence: Secondary | ICD-10-CM | POA: Insufficient documentation

## 2013-10-21 DIAGNOSIS — G43909 Migraine, unspecified, not intractable, without status migrainosus: Secondary | ICD-10-CM | POA: Insufficient documentation

## 2013-10-21 DIAGNOSIS — Z8742 Personal history of other diseases of the female genital tract: Secondary | ICD-10-CM | POA: Insufficient documentation

## 2013-10-21 DIAGNOSIS — E538 Deficiency of other specified B group vitamins: Secondary | ICD-10-CM | POA: Insufficient documentation

## 2013-10-21 DIAGNOSIS — Z79899 Other long term (current) drug therapy: Secondary | ICD-10-CM | POA: Insufficient documentation

## 2013-10-21 DIAGNOSIS — K219 Gastro-esophageal reflux disease without esophagitis: Secondary | ICD-10-CM | POA: Insufficient documentation

## 2013-10-21 DIAGNOSIS — Z8739 Personal history of other diseases of the musculoskeletal system and connective tissue: Secondary | ICD-10-CM | POA: Insufficient documentation

## 2013-10-21 DIAGNOSIS — G8929 Other chronic pain: Secondary | ICD-10-CM | POA: Insufficient documentation

## 2013-10-21 DIAGNOSIS — F411 Generalized anxiety disorder: Secondary | ICD-10-CM | POA: Insufficient documentation

## 2013-10-21 DIAGNOSIS — F329 Major depressive disorder, single episode, unspecified: Secondary | ICD-10-CM | POA: Insufficient documentation

## 2013-10-21 MED ORDER — PREDNISONE 50 MG PO TABS
60.0000 mg | ORAL_TABLET | Freq: Once | ORAL | Status: AC
  Administered 2013-10-21: 60 mg via ORAL
  Filled 2013-10-21 (×2): qty 1

## 2013-10-21 MED ORDER — OXYCODONE-ACETAMINOPHEN 5-325 MG PO TABS: 1.0000 | ORAL_TABLET | ORAL | Status: DC | PRN

## 2013-10-21 MED ORDER — PREDNISONE 20 MG PO TABS: 60.0000 mg | ORAL_TABLET | Freq: Every day | ORAL | Status: DC

## 2013-10-21 MED ORDER — HYDROMORPHONE HCL PF 2 MG/ML IJ SOLN
2.0000 mg | Freq: Once | INTRAMUSCULAR | Status: AC
  Administered 2013-10-21: 2 mg via INTRAMUSCULAR
  Filled 2013-10-21: qty 1

## 2013-10-21 NOTE — Discharge Instructions (Signed)
Back Pain, Adult Low back pain is very common. About 1 in 5 people have back pain.The cause of low back pain is rarely dangerous. The pain often gets better over time.About half of people with a sudden onset of back pain feel better in just 2 weeks. About 8 in 10 people feel better by 6 weeks.  CAUSES Some common causes of back pain include:  Strain of the muscles or ligaments supporting the spine.  Wear and tear (degeneration) of the spinal discs.  Arthritis.  Direct injury to the back. DIAGNOSIS Most of the time, the direct cause of low back pain is not known.However, back pain can be treated effectively even when the exact cause of the pain is unknown.Answering your caregiver's questions about your overall health and symptoms is one of the most accurate ways to make sure the cause of your pain is not dangerous. If your caregiver needs more information, he or she may order lab work or imaging tests (X-rays or MRIs).However, even if imaging tests show changes in your back, this usually does not require surgery. HOME CARE INSTRUCTIONS For many people, back pain returns.Since low back pain is rarely dangerous, it is often a condition that people can learn to manageon their own.   Remain active. It is stressful on the back to sit or stand in one place. Do not sit, drive, or stand in one place for more than 30 minutes at a time. Take short walks on level surfaces as soon as pain allows.Try to increase the length of time you walk each day.  Do not stay in bed.Resting more than 1 or 2 days can delay your recovery.  Do not avoid exercise or work.Your body is made to move.It is not dangerous to be active, even though your back may hurt.Your back will likely heal faster if you return to being active before your pain is gone.  Pay attention to your body when you bend and lift. Many people have less discomfortwhen lifting if they bend their knees, keep the load close to their bodies,and  avoid twisting. Often, the most comfortable positions are those that put less stress on your recovering back.  Find a comfortable position to sleep. Use a firm mattress and lie on your side with your knees slightly bent. If you lie on your back, put a pillow under your knees.  Only take over-the-counter or prescription medicines as directed by your caregiver. Over-the-counter medicines to reduce pain and inflammation are often the most helpful.Your caregiver may prescribe muscle relaxant drugs.These medicines help dull your pain so you can more quickly return to your normal activities and healthy exercise.  Put ice on the injured area.  Put ice in a plastic bag.  Place a towel between your skin and the bag.  Leave the ice on for 15-20 minutes, 03-04 times a day for the first 2 to 3 days. After that, ice and heat may be alternated to reduce pain and spasms.  Ask your caregiver about trying back exercises and gentle massage. This may be of some benefit.  Avoid feeling anxious or stressed.Stress increases muscle tension and can worsen back pain.It is important to recognize when you are anxious or stressed and learn ways to manage it.Exercise is a great option. SEEK MEDICAL CARE IF:  You have pain that is not relieved with rest or medicine.  You have pain that does not improve in 1 week.  You have new symptoms.  You are generally not feeling well. SEEK   IMMEDIATE MEDICAL CARE IF:   You have pain that radiates from your back into your legs.  You develop new bowel or bladder control problems.  You have unusual weakness or numbness in your arms or legs.  You develop nausea or vomiting.  You develop abdominal pain.  You feel faint. Document Released: 05/18/2005 Document Revised: 11/17/2011 Document Reviewed: 10/06/2010 [employer] Patient Information 2014 [employer].  

## 2013-10-21 NOTE — ED Provider Notes (Signed)
CSN: [id number]     Arrival date & time 10/21/13  1722 History   First MD Initiated Contact with Patient 10/21/13 1829     Chief Complaint  Patient presents with  . Back Pain     (Consider location/radiation/quality/duration/timing/severity/associated sxs/prior Treatment) Patient is a 38 y.o. female presenting with back pain. The history is provided by the patient. No language interpreter was used.  Back Pain Associated symptoms: no abdominal pain, no chest pain, no fever, no numbness and no weakness   Associated symptoms comment:  The patient reports severe lower left back pain, radiating to left hip and into left lower extremity. She has chronic back pain she states is usually tolerable and radiation that usually stops at the hip. She denies urinary or bowel incontinence. She is not aware of any injury or strain to the back that would cause such a severe pain. She has pain medication at home that is not helping. She take Celebrex, and Flexeril as well. Last MRI was last year.   Past Medical History  Diagnosis Date  . Allergy   . Headache(784.0)   . GERD (gastroesophageal reflux disease)   . Polycystic ovaries   . B12 deficiency   . Peripheral neuropathy   . Fibromyalgia   . Depression   . Migraine   . Anxiety    Past Surgical History  Procedure Laterality Date  . Appendectomy    . Abdominal hysterectomy    . Tonsillectomy    . Sinusotomy  12/14/06  . Wrist ganglion excision    . Ovarian cyst removal    . Oophorectomy      left overy  . Rectocele repair    . Bladder tack  2012  . Back surgery  2012   Family History  Problem Relation Age of Onset  . Fibromyalgia Mother   . Diabetes Mother   . Cancer Mother     thyrod  . Liver disease Mother   . Neuropathy Mother   . Hypertension Father   . Diabetes Father   . Heart disease Father    History  Substance Use Topics  . Smoking status: Former Smoker    Quit date: 06/01/1998  . Smokeless tobacco: Never Used  .  Alcohol Use: No   OB History   Grav Para Term Preterm Abortions TAB SAB Ect Mult Living   2 2  2  0     2     Review of Systems  Constitutional: Negative for fever.  Respiratory: Negative for shortness of breath.   Cardiovascular: Negative for chest pain.  Gastrointestinal: Negative for nausea, vomiting and abdominal pain.  Musculoskeletal: Positive for back pain.  Skin: Negative for color change.  Neurological: Negative for weakness and numbness.       Radicular left LE pain      Allergies  Gadolinium derivatives  Home Medications   Prior to Admission medications   Medication Sig Start Date End Date Taking? Authorizing Provider  ALPRAZolam [name](XANAX) 0.5 MG tablet Take 1 tablet (0.5 mg total) by mouth 3 (three) times daily as needed. 02/09/13   Nelwyn SalisburyStephen A Fry, MD  Alum & Mag Hydroxide-Simeth (GI COCKTAIL) SUSP suspension Take 30 mLs by mouth 3 (three) times daily. Shake well. 08/09/13   Nelwyn SalisburyStephen A Fry, MD  celecoxib (CELEBREX) 200 MG capsule Take 200 mg by mouth 2 (two) times daily.    Historical Provider, MD  cyanocobalamin (,VITAMIN B-12,) 1000 MCG/ML injection Inject 1 mL (1,000 mcg total) into the muscle every  7 (seven) days. 10/12/13   [name], MD  cyclobenzaprine (FLEXERIL) 10 MG tablet Take 10 mg by mouth every 8 (eight) hours as needed. PATIENT TAKES FOR MUSCLE SPASMS     Historical Provider, MD  fluconazole (DIFLUCAN) 150 MG tablet TAKE AS DIRECTED    [name], MD  fluticasone (FLONASE) 50 MCG/ACT nasal spray 2 SPRAYS IN NASAL ROUTE DAILY. 02/09/13   [name], MD  gabapentin (NEURONTIN) 300 MG capsule Take 1 capsule (300 mg total) by mouth 3 (three) times daily. 08/09/13   [name], MD  hydrochlorothiazide (HYDRODIURIL) 25 MG tablet Take 25 mg by mouth daily.  02/08/13   Historical Provider, MD  HYDROcodone-acetaminophen (NORCO) 7.5-325 MG per tablet TAKE 1 TABLET EVERY 6 HOURS AS NEEDED FOR PAIN 10/18/13   [name], MD  Multiple Vitamins-Minerals  (MULTI-VITAMIN GUMMIES PO) Take 1 tablet by mouth daily.      Historical Provider, MD  nortriptyline (PAMELOR) 50 MG capsule Take 1 capsule (50 mg total) by mouth at bedtime. 09/12/13   Adam Gus Rankin, DO  pantoprazole (PROTONIX) 40 MG tablet Take 1 tablet (40 mg total) by mouth 2 (two) times daily. 10/12/13   [name], MD  phentermine (ADIPEX-P) 37.5 MG tablet TAKE 1 TABLET EVERY DAY 08/18/13   [name], MD  potassium chloride (KLOR-CON 10) 10 MEQ tablet Take 1 tablet (10 mEq total) by mouth daily. 02/27/13   [name], MD  promethazine (PHENERGAN) 25 MG tablet Take 1 tablet (25 mg total) by mouth every 6 (six) hours as needed for nausea. PATIENT TAKES FOR NAUSEA 10/12/13   [name], MD  SUMAtriptan (IMITREX) 25 MG tablet Take 1 pill at immediate onset of headache.  May repeat in 2 hours x1 if headache persists or recurs. 02/21/13   Adam Gus Rankin, DO  SYRINGE DISPOSABLE 3CC 3 ML MISC Inject 1 ml once a month intramuscular 10/18/13   [name], MD  SYRINGE-NEEDLE, DISP, 3 ML (BD INTEGRA SYRINGE) 25G X 1" 3 ML MISC 1 each by Does not apply route once a week. 10/19/13   [name], MD   BP 129/87  Pulse 60  Temp(Src) 98.8 F (37.1 C)  Resp 18  Ht 5\' 9"  (1.753 m)  Wt 262 lb (118.842 kg)  BMI 38.67 kg/m2  SpO2 97%  LMP 12/13/1999 Physical Exam  Constitutional: She is oriented to person, place, and time. She appears well-developed and well-nourished. No distress.  Cardiovascular: Intact distal pulses.   Musculoskeletal: She exhibits no edema.  Significant tenderness to left lower back. No swelling or discoloration. Tenderness extends to hip. FROM all joints. No strength deficits of lower extremity. No swelling or discoloration.   Neurological: She is alert and oriented to person, place, and time. She has normal reflexes.    ED Course  Procedures (including critical care time) Labs Review Labs Reviewed - No data to display  Imaging Review No results found.   EKG  Interpretation None      MDM   Final diagnoses:  None    1. Lumbar radiculopathy  Pain is improved with IM Dilaudid. Outpatient MRI scheduled for Tuesday, May 26th. Results to be sent to Dr. Clent Ridges for follow up. Patient and husband comfortable with discharge home.     Arnoldo Hooker, PA-C 10/22/13 1623

## 2013-10-21 NOTE — ED Notes (Signed)
C/o low back pain, radiating down left hip and leg.  No known injury.  Onset was this week, worse yesterday.  Denies bladder/bowel issues.  Able to ambulate, just hindered d/t pain.

## 2013-10-22 NOTE — ED Provider Notes (Signed)
Medical screening examination/treatment/procedure(s) were performed by non-physician practitioner and as supervising physician I was immediately available for consultation/collaboration.   EKG Interpretation None        Calypso Hagarty, MD 10/22/13 1700 

## 2013-10-24 MED ORDER — NORTRIPTYLINE HCL 50 MG PO CAPS: 50.0000 mg | ORAL_CAPSULE | Freq: Every day | ORAL | Status: DC

## 2013-10-24 MED ORDER — OXYCODONE-ACETAMINOPHEN 10-325 MG PO TABS: 1.0000 | ORAL_TABLET | Freq: Three times a day (TID) | ORAL | Status: DC | PRN

## 2013-10-24 NOTE — Telephone Encounter (Signed)
I actually made the ER followup appointment for Tuesday, 10/31/13 at 3:15 pm because the patient stated that she was told that the results should be in by 2 pm that afternoon and she didn't want to wait any longer.  cj

## 2013-10-24 NOTE — Telephone Encounter (Signed)
done

## 2013-10-24 NOTE — Telephone Encounter (Signed)
Patient was seen at the ER on Saturday, 10/21/13 for back and leg pain and was given oxyCODONE-acetaminophen (PERCOCET/ROXICET) 5-325 MG per tablet and she has 3 tablets left and Prednisone(DELASONE) 20 MG tablet - 3 tablets per day and she is only have enough left for today and the MRI is scheduled for next Tuesday, 10/31/13. The ER physician ask that she be seen by her PCP a day after MRI. I am scheduling the followup appointment, but she desperately needs a refill on medication as soon as possible. Patient's telephone number has been verified.  cj

## 2013-10-27 ENCOUNTER — Telehealth: Payer: Self-pay | Admitting: Family Medicine

## 2013-10-27 MED ORDER — PREDNISONE 20 MG PO TABS: 20.0000 mg | ORAL_TABLET | Freq: Two times a day (BID) | ORAL | Status: DC

## 2013-10-27 NOTE — Addendum Note (Signed)
Addended by: Aniceto Boss A on: 10/27/2013 03:55 PM   Modules accepted: Orders

## 2013-10-27 NOTE — Telephone Encounter (Signed)
Pt is having MRI done on 6/2 at 10:00, pt has appt with dr. Clent Ridges at 3:15 on 6/2 pt is concerned that the results may not be available for dr. Clent Ridges to review, wants to know if she should moved her appt with dr. Clent Ridges until 6/3, if so ok to use sda? Pt states she will keep the appt if dr. Clent Ridges thinks the results will be available for him to review.

## 2013-10-27 NOTE — Telephone Encounter (Signed)
Call in Prednisone 20 mg to take bid until the results are back, #30 with no rf

## 2013-10-27 NOTE — Telephone Encounter (Signed)
appt scheduled fo pt

## 2013-10-27 NOTE — Telephone Encounter (Signed)
I spoke with pt and she will reschedule this appointment for Dr. Clent Ridges. Pt now would like to know if she needs to continue on the Prednisone, she stated that it did help with the back pain.

## 2013-10-27 NOTE — Telephone Encounter (Signed)
I sent script e-scribe and spoke with pt. 

## 2013-10-31 ENCOUNTER — Ambulatory Visit (HOSPITAL_COMMUNITY)
Admission: RE | Admit: 2013-10-31 | Discharge: 2013-10-31 | Disposition: A | Discharge status: Home / Self Care | Payer: BC Managed Care – PPO | Source: Ambulatory Visit | Attending: Emergency Medicine | Admitting: Emergency Medicine

## 2013-10-31 ENCOUNTER — Ambulatory Visit: Payer: BC Managed Care – PPO | Admitting: Family Medicine

## 2013-10-31 ENCOUNTER — Ambulatory Visit (HOSPITAL_COMMUNITY): Admission: RE | Admit: 2013-10-31 | Payer: BC Managed Care – PPO | Source: Ambulatory Visit

## 2013-10-31 DIAGNOSIS — M5126 Other intervertebral disc displacement, lumbar region: Secondary | ICD-10-CM | POA: Insufficient documentation

## 2013-11-01 ENCOUNTER — Ambulatory Visit (INDEPENDENT_AMBULATORY_CARE_PROVIDER_SITE_OTHER): Payer: BC Managed Care – PPO | Admitting: Family Medicine

## 2013-11-01 ENCOUNTER — Encounter: Payer: Self-pay | Admitting: Family Medicine

## 2013-11-01 ENCOUNTER — Telehealth: Payer: Self-pay | Admitting: Family Medicine

## 2013-11-01 VITALS — BP 120/93 | HR 114 | Temp 98.2°F | Wt 261.0 lb

## 2013-11-01 DIAGNOSIS — M545 Low back pain, unspecified: Secondary | ICD-10-CM

## 2013-11-01 MED ORDER — FLUCONAZOLE 150 MG PO TABS: ORAL_TABLET | ORAL | Status: DC

## 2013-11-01 NOTE — Progress Notes (Signed)
Pre visit review using our clinic review tool, if applicable. No additional management support is needed unless otherwise documented below in the visit note. 

## 2013-11-01 NOTE — Progress Notes (Signed)
   Subjective:    Patient ID: [name], female    DOB: [birth year], 38 y.o.   MRN: [id number]  HPI Here to follow up an ER visit on 10-24-13 for sudden worsening of low back pain. She has had chronic low level back pain ever since her laminectomy in 2012, but about 10 days ago this suddenly became much worse. The pain radiates down the left leg and the leg gets weak. She went to the ER and an MRI was arranged. This shows a herniated disc at L4-5 (the level below her previous surgery site) which is pressuring the left nerve root. She is taking Prednisone and Percocet, and these make the pain a bit more tolerable.    Review of Systems  Constitutional: Negative.   Musculoskeletal: Positive for back pain.       Objective:   Physical Exam  Constitutional: She appears well-developed and well-nourished.  Limping, in pain   Musculoskeletal:  Tender in the left lower back with spasm and reduced ROM          Assessment & Plan:  We will refer her back to Neurosurgery to evaluate.

## 2013-11-01 NOTE — Telephone Encounter (Signed)
Pt would like to know if you would like pt to continue taking the predniSONE (DELTASONE) 20 MG tablet  until she sees the neuro surgeon-do you want her to take all 30 or stop a certain time. Pt states she just forgot to ask how long she should take this med.  Pls advise

## 2013-11-02 MED ORDER — PREDNISONE 20 MG PO TABS: 20.0000 mg | ORAL_TABLET | Freq: Two times a day (BID) | ORAL | Status: DC

## 2013-11-02 NOTE — Telephone Encounter (Signed)
Per Dr. Clent Ridges, send in Prednisone 20 mg take 1 po bid # 60 with 0 refills. I sent script e-scribe and spoke with pt.

## 2013-11-02 NOTE — Telephone Encounter (Signed)
Keep taking the 20 mg tablet BID until she sees the neurosurgeon

## 2013-11-08 ENCOUNTER — Other Ambulatory Visit (HOSPITAL_COMMUNITY): Payer: BC Managed Care – PPO

## 2013-11-11 ENCOUNTER — Encounter (HOSPITAL_BASED_OUTPATIENT_CLINIC_OR_DEPARTMENT_OTHER): Payer: Self-pay | Admitting: Emergency Medicine

## 2013-11-11 ENCOUNTER — Emergency Department (HOSPITAL_BASED_OUTPATIENT_CLINIC_OR_DEPARTMENT_OTHER)
Admission: EM | Admit: 2013-11-11 | Discharge: 2013-11-11 | Disposition: A | Discharge status: Home / Self Care | Payer: BC Managed Care – PPO | Attending: Emergency Medicine | Admitting: Emergency Medicine

## 2013-11-11 DIAGNOSIS — K219 Gastro-esophageal reflux disease without esophagitis: Secondary | ICD-10-CM | POA: Insufficient documentation

## 2013-11-11 DIAGNOSIS — F411 Generalized anxiety disorder: Secondary | ICD-10-CM | POA: Insufficient documentation

## 2013-11-11 DIAGNOSIS — G609 Hereditary and idiopathic neuropathy, unspecified: Secondary | ICD-10-CM | POA: Insufficient documentation

## 2013-11-11 DIAGNOSIS — M545 Low back pain, unspecified: Secondary | ICD-10-CM

## 2013-11-11 DIAGNOSIS — R269 Unspecified abnormalities of gait and mobility: Secondary | ICD-10-CM | POA: Insufficient documentation

## 2013-11-11 DIAGNOSIS — F329 Major depressive disorder, single episode, unspecified: Secondary | ICD-10-CM | POA: Insufficient documentation

## 2013-11-11 DIAGNOSIS — M519 Unspecified thoracic, thoracolumbar and lumbosacral intervertebral disc disorder: Secondary | ICD-10-CM

## 2013-11-11 DIAGNOSIS — Z87891 Personal history of nicotine dependence: Secondary | ICD-10-CM | POA: Insufficient documentation

## 2013-11-11 DIAGNOSIS — Z79899 Other long term (current) drug therapy: Secondary | ICD-10-CM | POA: Insufficient documentation

## 2013-11-11 DIAGNOSIS — G43909 Migraine, unspecified, not intractable, without status migrainosus: Secondary | ICD-10-CM | POA: Insufficient documentation

## 2013-11-11 DIAGNOSIS — E538 Deficiency of other specified B group vitamins: Secondary | ICD-10-CM | POA: Insufficient documentation

## 2013-11-11 DIAGNOSIS — Z9089 Acquired absence of other organs: Secondary | ICD-10-CM | POA: Insufficient documentation

## 2013-11-11 DIAGNOSIS — F3289 Other specified depressive episodes: Secondary | ICD-10-CM | POA: Insufficient documentation

## 2013-11-11 DIAGNOSIS — IMO0002 Reserved for concepts with insufficient information to code with codable children: Secondary | ICD-10-CM | POA: Insufficient documentation

## 2013-11-11 MED ORDER — KETOROLAC TROMETHAMINE 60 MG/2ML IM SOLN
60.0000 mg | Freq: Once | INTRAMUSCULAR | Status: AC
  Administered 2013-11-11: 60 mg via INTRAMUSCULAR
  Filled 2013-11-11: qty 2

## 2013-11-11 MED ORDER — HYDROMORPHONE HCL PF 2 MG/ML IJ SOLN
2.0000 mg | Freq: Once | INTRAMUSCULAR | Status: AC
  Administered 2013-11-11: 2 mg via INTRAMUSCULAR
  Filled 2013-11-11: qty 1

## 2013-11-11 NOTE — ED Provider Notes (Signed)
CSN: [id number]     Arrival date & time 11/11/13  1606 History  This chart was scribed for Hannah Lyonsouglas Iviona Hole, MD by [name], ED Scribe. This patient was seen in room MH03/MH03 and the patient's care was started at 4:27 PM.    Chief Complaint  Patient presents with  . Leg Pain   Patient is a 38 y.o. female presenting with leg pain. The history is provided by the patient. No language interpreter was used.  Leg Pain Associated symptoms: back pain    HPI Comments: [name] is a 38 y.o. female who presents to the Emergency Department complaining of back pain onset may 31,2015. She has associated pain in her back. She states she was [name] She states she has been having slicing pain. She states that the pain is radiating down her leg. She states she has been taking percocet with no improvement. She states that she has been applying ice with no improvement. She states that she has been having trouble ambulating due to the pain. She states that from her previous back surgery she has a pinched nerve that she has to live with.   She states she has h/o back surgery. She states she has had surgery on her L3-L4 spine.   Past Medical History  Diagnosis Date  . Allergy   . Headache(784.0)   . GERD (gastroesophageal reflux disease)   . Polycystic ovaries   . B12 deficiency   . Peripheral neuropathy   . Fibromyalgia   . Depression   . Migraine   . Anxiety    Past Surgical History  Procedure Laterality Date  . Appendectomy    . Abdominal hysterectomy    . Tonsillectomy    . Sinusotomy  12/14/06  . Wrist ganglion excision    . Ovarian cyst removal    . Oophorectomy      left overy  . Rectocele repair    . Bladder tack  2012  . Back surgery  2012    left laminectomy at L3-4 per [name]    Family History  Problem Relation Age of Onset  . Fibromyalgia Mother   . Diabetes Mother   . Cancer Mother     thyrod  . Liver  disease Mother   . Neuropathy Mother   . Hypertension Father   . Diabetes Father   . Heart disease Father    History  Substance Use Topics  . Smoking status: Former Smoker    Quit date: 06/01/1998  . Smokeless tobacco: Never Used  . Alcohol Use: No   OB History   Grav Para Term Preterm Abortions TAB SAB Ect Mult Living   2 2  2  0     2     Review of Systems  Musculoskeletal: Positive for arthralgias, back pain and gait problem.   A complete 10 system review of systems was obtained and all systems are negative except as noted in the HPI and PMH.    Allergies  Gadolinium derivatives  Home Medications   Prior to Admission medications   Medication Sig Start Date End Date Taking? Authorizing Provider  ALPRAZolam [name](XANAX) 0.5 MG tablet Take 1 tablet (0.5 mg total) by mouth 3 (three) times daily as needed. 02/09/13   Nelwyn SalisburyStephen A Fry, MD  Alum & Mag Hydroxide-Simeth (GI COCKTAIL) SUSP suspension Take 30 mLs by mouth 3 (three) times daily. Shake well. 08/09/13   Nelwyn SalisburyStephen A Fry, MD  celecoxib (CELEBREX) 200 MG capsule Take 200 mg by mouth 2 (two) times daily.    Historical Provider, MD  cyanocobalamin (,VITAMIN B-12,) 1000 MCG/ML injection Inject 1 mL (1,000 mcg total) into the muscle every 7 (seven) days. 10/12/13   Nelwyn SalisburyStephen A Fry, MD  cyclobenzaprine (FLEXERIL) 10 MG tablet Take 10 mg by mouth every 8 (eight) hours as needed. PATIENT TAKES FOR MUSCLE SPASMS     Historical Provider, MD  fluconazole (DIFLUCAN) 150 MG tablet TAKE AS DIRECTED 11/01/13   Nelwyn SalisburyStephen A Fry, MD  fluticasone (FLONASE) 50 MCG/ACT nasal spray 2 SPRAYS IN NASAL ROUTE DAILY. 02/09/13   Nelwyn SalisburyStephen A Fry, MD  gabapentin (NEURONTIN) 300 MG capsule Take 1 capsule (300 mg total) by mouth 3 (three) times daily. 08/09/13   Nelwyn SalisburyStephen A Fry, MD  hydrochlorothiazide (HYDRODIURIL) 25 MG tablet Take 25 mg by mouth daily.  02/08/13   Historical Provider, MD  HYDROcodone-acetaminophen (NORCO) 7.5-325 MG per tablet TAKE 1 TABLET EVERY 6 HOURS AS  NEEDED FOR PAIN 10/18/13   Nelwyn SalisburyStephen A Fry, MD  Multiple Vitamins-Minerals (MULTI-VITAMIN GUMMIES PO) Take 1 tablet by mouth daily.      Historical Provider, MD  nortriptyline (PAMELOR) 50 MG capsule Take 1 capsule (50 mg total) by mouth at bedtime. 10/24/13   Nelwyn SalisburyStephen A Fry, MD  oxyCODONE-acetaminophen (PERCOCET) 10-325 MG per tablet Take 1 tablet by mouth every 8 (eight) hours as needed for pain. 10/24/13   Nelwyn SalisburyStephen A Fry, MD  pantoprazole (PROTONIX) 40 MG tablet Take 1 tablet (40 mg total) by mouth 2 (two) times daily. 10/12/13   Nelwyn SalisburyStephen A Fry, MD  phentermine (ADIPEX-P) 37.5 MG tablet TAKE 1 TABLET EVERY DAY 08/18/13   Nelwyn SalisburyStephen A Fry, MD  potassium chloride (KLOR-CON 10) 10 MEQ tablet Take 1 tablet (10 mEq total) by mouth daily. 02/27/13   Nelwyn SalisburyStephen A Fry, MD  predniSONE (DELTASONE) 20 MG tablet Take 3 tablets (60 mg total) by mouth daily. 10/21/13   Shari A Upstill, PA-C  predniSONE (DELTASONE) 20 MG tablet Take 1 tablet (20 mg total) by mouth 2 (two) times daily. 11/02/13   Nelwyn SalisburyStephen A Fry, MD  promethazine (PHENERGAN) 25 MG tablet Take 1 tablet (25 mg total) by mouth every 6 (six) hours as needed for nausea. PATIENT TAKES FOR NAUSEA 10/12/13   Nelwyn SalisburyStephen A Fry, MD  SUMAtriptan (IMITREX) 25 MG tablet Take 1 pill at immediate onset of headache.  May repeat in 2 hours x1 if headache persists or recurs. 02/21/13   Adam Gus Rankinobert Jaffe, DO  SYRINGE DISPOSABLE 3CC 3 ML MISC Inject 1 ml once a month intramuscular 10/18/13   Nelwyn SalisburyStephen A Fry, MD  SYRINGE-NEEDLE, DISP, 3 ML (BD INTEGRA SYRINGE) 25G X 1" 3 ML MISC 1 each by Does not apply route once a week. 10/19/13   Nelwyn SalisburyStephen A Fry, MD   Triage Vitals: BP 161/132  Pulse 111  Temp(Src) 98.5 F (36.9 C) (Oral)  Resp 24  Ht 5\' 9"  (1.753 m)  Wt 260 lb (117.935 kg)  BMI 38.38 kg/m2  SpO2 98%  LMP 12/13/1999  Physical Exam  Nursing note and vitals reviewed. Constitutional: She is oriented to person, place, and time. She appears well-developed and well-nourished. No distress.   HENT:  Head: Normocephalic and atraumatic.  Eyes: Conjunctivae and EOM are normal.  Neck: Normal range of motion. No tracheal deviation present.  Cardiovascular: Normal rate.   Pulmonary/Chest: Effort normal. No respiratory distress.  Musculoskeletal: Normal range of motion. She exhibits tenderness.  tenderness to palpation in  the left lumbar region and buttock. Strength 5/5 in bilateral lower extremities. She is able to ambulate, with discomfort  Neurological: She is alert and oriented to person, place, and time.  Skin: Skin is warm and dry.  Psychiatric: She has a normal mood and affect. Her behavior is normal.    ED Course  Procedures (including critical care time) DIAGNOSTIC STUDIES: Oxygen Saturation is 98% on RA, normal by my interpretation.    COORDINATION OF CARE: 4:40 PM-Discussed treatment plan which includes Dilaudid and Toradol injection with pt at bedside and pt agreed to plan.     Labs Review Labs Reviewed - No data to display  Imaging Review No results found.   EKG Interpretation None      MDM   Final diagnoses:  None    Patient presents with complaints of back pain. She recently had an MRI that revealed 2 slipped discs. She has a TENS unit and medication at home which has not helped. She denies any bowel or bladder complaints. She denies any new injury or trauma.  Her physical examination is remarkable for tenderness to palpation in the left lumbar soft tissues, however there is no bony tenderness and no step-offs. She has 5 out of 5 strength in both lower extremities and reflexes are symmetrical and intact. She is feeling better after medication administered in the ER and I believe is appropriate for discharge. She had an MRI 2 weeks ago and I do not feel as though another is indicated. She will be discharged to home and advised to continue her medications as before and    I personally performed the services described in this documentation, which was  scribed in my presence. The recorded information has been reviewed and is accurate.       Hannah Lyons, MD 11/11/13 [id number]

## 2013-11-11 NOTE — ED Notes (Signed)
C/o low back problems- has been dealing with left leg pain since [facility] Day.  Reports slicing, burning pain down her left leg.  Has a TENS unit on her lower back.

## 2013-11-11 NOTE — ED Notes (Signed)
MD at bedside. 

## 2013-11-11 NOTE — Discharge Instructions (Signed)
Continue your pain medication as before.  Followup with your spine Dr. as scheduled.   Back Pain, Adult Low back pain is very common. About 1 in 5 people have back pain.The cause of low back pain is rarely dangerous. The pain often gets better over time.About half of people with a sudden onset of back pain feel better in just 2 weeks. About 8 in 10 people feel better by 6 weeks.  CAUSES Some common causes of back pain include:  Strain of the muscles or ligaments supporting the spine.  Wear and tear (degeneration) of the spinal discs.  Arthritis.  Direct injury to the back. DIAGNOSIS Most of the time, the direct cause of low back pain is not known.However, back pain can be treated effectively even when the exact cause of the pain is unknown.Answering your caregiver's questions about your overall health and symptoms is one of the most accurate ways to make sure the cause of your pain is not dangerous. If your caregiver needs more information, he or she may order lab work or imaging tests (X-rays or MRIs).However, even if imaging tests show changes in your back, this usually does not require surgery. HOME CARE INSTRUCTIONS For many people, back pain returns.Since low back pain is rarely dangerous, it is often a condition that people can learn to [facility] their own.   Remain active. It is stressful on the back to sit or stand in one place. Do not sit, drive, or stand in one place for more than 30 minutes at a time. Take short walks on level surfaces as soon as pain allows.Try to increase the length of time you walk each day.  Do not stay in bed.Resting more than 1 or 2 days can delay your recovery.  Do not avoid exercise or work.Your body is made to move.It is not dangerous to be active, even though your back may hurt.Your back will likely heal faster if you return to being active before your pain is gone.  Pay attention to your body when you bend and lift. Many people have less  discomfortwhen lifting if they bend their knees, keep the load close to their bodies,and avoid twisting. Often, the most comfortable positions are those that put less stress on your recovering back.  Find a comfortable position to sleep. Use a firm mattress and lie on your side with your knees slightly bent. If you lie on your back, put a pillow under your knees.  Only take over-the-counter or prescription medicines as directed by your caregiver. Over-the-counter medicines to reduce pain and inflammation are often the most helpful.Your caregiver may prescribe muscle relaxant drugs.These medicines help dull your pain so you can more quickly return to your normal activities and healthy exercise.  Put ice on the injured area.  Put ice in a plastic bag.  Place a towel between your skin and the bag.  Leave the ice on for 15-20 minutes, 03-04 times a day for the first 2 to 3 days. After that, ice and heat may be alternated to reduce pain and spasms.  Ask your caregiver about trying back exercises and gentle massage. This may be of some benefit.  Avoid feeling anxious or stressed.Stress increases muscle tension and can worsen back pain.It is important to recognize when you are anxious or stressed and learn ways to manage it.Exercise is a great option. SEEK MEDICAL CARE IF:  You have pain that is not relieved with rest or medicine.  You have pain that does not improve  in 1 week.  You have new symptoms.  You are generally not feeling well. SEEK IMMEDIATE MEDICAL CARE IF:   You have pain that radiates from your back into your legs.  You develop new bowel or bladder control problems.  You have unusual weakness or numbness in your arms or legs.  You develop nausea or vomiting.  You develop abdominal pain.  You feel faint. Document Released: 05/18/2005 Document Revised: 11/17/2011 Document Reviewed: 10/06/2010 [facility] Patient Information 2014 [place], [place].

## 2013-11-12 ENCOUNTER — Emergency Department (HOSPITAL_COMMUNITY)
Admission: EM | Admit: 2013-11-12 | Discharge: 2013-11-12 | Disposition: A | Discharge status: Home / Self Care | Payer: BC Managed Care – PPO | Attending: Emergency Medicine | Admitting: Emergency Medicine

## 2013-11-12 ENCOUNTER — Encounter (HOSPITAL_COMMUNITY): Payer: Self-pay | Admitting: Emergency Medicine

## 2013-11-12 DIAGNOSIS — G43909 Migraine, unspecified, not intractable, without status migrainosus: Secondary | ICD-10-CM | POA: Insufficient documentation

## 2013-11-12 DIAGNOSIS — Z87891 Personal history of nicotine dependence: Secondary | ICD-10-CM | POA: Insufficient documentation

## 2013-11-12 DIAGNOSIS — F329 Major depressive disorder, single episode, unspecified: Secondary | ICD-10-CM | POA: Insufficient documentation

## 2013-11-12 DIAGNOSIS — Z79899 Other long term (current) drug therapy: Secondary | ICD-10-CM | POA: Insufficient documentation

## 2013-11-12 DIAGNOSIS — Z9889 Other specified postprocedural states: Secondary | ICD-10-CM | POA: Insufficient documentation

## 2013-11-12 DIAGNOSIS — F411 Generalized anxiety disorder: Secondary | ICD-10-CM | POA: Insufficient documentation

## 2013-11-12 DIAGNOSIS — M5416 Radiculopathy, lumbar region: Secondary | ICD-10-CM

## 2013-11-12 DIAGNOSIS — K219 Gastro-esophageal reflux disease without esophagitis: Secondary | ICD-10-CM | POA: Insufficient documentation

## 2013-11-12 DIAGNOSIS — Z862 Personal history of diseases of the blood and blood-forming organs and certain disorders involving the immune mechanism: Secondary | ICD-10-CM | POA: Insufficient documentation

## 2013-11-12 DIAGNOSIS — Z8639 Personal history of other endocrine, nutritional and metabolic disease: Secondary | ICD-10-CM | POA: Insufficient documentation

## 2013-11-12 DIAGNOSIS — F3289 Other specified depressive episodes: Secondary | ICD-10-CM | POA: Insufficient documentation

## 2013-11-12 DIAGNOSIS — Z8742 Personal history of other diseases of the female genital tract: Secondary | ICD-10-CM | POA: Insufficient documentation

## 2013-11-12 DIAGNOSIS — IMO0002 Reserved for concepts with insufficient information to code with codable children: Secondary | ICD-10-CM | POA: Insufficient documentation

## 2013-11-12 MED ORDER — HYDROMORPHONE HCL PF 1 MG/ML IJ SOLN
1.0000 mg | Freq: Once | INTRAMUSCULAR | Status: AC
  Administered 2013-11-12: 1 mg via INTRAVENOUS
  Filled 2013-11-12: qty 1

## 2013-11-12 MED ORDER — KETOROLAC TROMETHAMINE 30 MG/ML IJ SOLN
30.0000 mg | Freq: Once | INTRAMUSCULAR | Status: AC
  Administered 2013-11-12: 30 mg via INTRAVENOUS
  Filled 2013-11-12: qty 1

## 2013-11-12 MED ORDER — DIAZEPAM 5 MG PO TABS
5.0000 mg | ORAL_TABLET | Freq: Once | ORAL | Status: AC
  Administered 2013-11-12: 5 mg via ORAL
  Filled 2013-11-12: qty 1

## 2013-11-12 MED ORDER — DIAZEPAM 5 MG/ML IJ SOLN
2.5000 mg | Freq: Once | INTRAMUSCULAR | Status: AC
  Administered 2013-11-12: 2.5 mg via INTRAVENOUS
  Filled 2013-11-12: qty 2

## 2013-11-12 MED ORDER — GABAPENTIN 300 MG PO CAPS
600.0000 mg | ORAL_CAPSULE | Freq: Once | ORAL | Status: AC
  Administered 2013-11-12: 600 mg via ORAL
  Filled 2013-11-12: qty 2

## 2013-11-12 MED ORDER — OXYCODONE HCL 5 MG PO TABS: 5.0000 mg | ORAL_TABLET | ORAL | Status: DC | PRN

## 2013-11-12 MED ORDER — HYDROMORPHONE HCL PF 1 MG/ML IJ SOLN
1.0000 mg | INTRAMUSCULAR | Status: AC | PRN
  Administered 2013-11-12 (×2): 1 mg via INTRAVENOUS
  Filled 2013-11-12 (×2): qty 1

## 2013-11-12 MED ORDER — DIAZEPAM 5 MG PO TABS: 5.0000 mg | ORAL_TABLET | Freq: Three times a day (TID) | ORAL | Status: DC | PRN

## 2013-11-12 MED ORDER — HYDROMORPHONE HCL PF 1 MG/ML IJ SOLN: 1.0000 mg | Freq: Once | INTRAMUSCULAR | Status: DC

## 2013-11-12 NOTE — Discharge Instructions (Signed)
Back Exercises Back exercises help treat and prevent back injuries. The goal of back exercises is to increase the strength of your abdominal and back muscles and the flexibility of your back. These exercises should be started when you no longer have back pain. Back exercises include:  Pelvic Tilt. Lie on your back with your knees bent. Tilt your pelvis until the lower part of your back is against the floor. Hold this position 5 to 10 sec and repeat 5 to 10 times.  Knee to Chest. Pull first 1 knee up against your chest and hold for 20 to 30 seconds, repeat this with the other knee, and then both knees. This may be done with the other leg straight or bent, whichever feels better.  Sit-Ups or Curl-Ups. Bend your knees 90 degrees. Start with tilting your pelvis, and do a partial, slow sit-up, lifting your trunk only 30 to 45 degrees off the floor. Take at least 2 to 3 seconds for each sit-up. Do not do sit-ups with your knees out straight. If partial sit-ups are difficult, simply do the above but with only tightening your abdominal muscles and holding it as directed.  Hip-Lift. Lie on your back with your knees flexed 90 degrees. Push down with your feet and shoulders as you raise your hips a couple inches off the floor; hold for 10 seconds, repeat 5 to 10 times.  Back arches. Lie on your stomach, propping yourself up on bent elbows. Slowly press on your hands, causing an arch in your low back. Repeat 3 to 5 times. Any initial stiffness and discomfort should lessen with repetition over time.  Shoulder-Lifts. Lie face down with arms beside your body. Keep hips and torso pressed to floor as you slowly lift your head and shoulders off the floor. Do not overdo your exercises, especially in the beginning. Exercises may cause you some mild back discomfort which lasts for a few minutes; however, if the pain is more severe, or lasts for more than 15 minutes, do not continue exercises until you see your caregiver.  Improvement with exercise therapy for back problems is slow.  See your caregivers for assistance with developing a proper back exercise program. Document Released: 06/25/2004 Document Revised: 08/10/2011 Document Reviewed: 03/19/2011 [employer] Patient Information 2014 [employer].  

## 2013-11-12 NOTE — ED Notes (Signed)
Pt reports chronic back pain and issues. Recently got an MRI. Was seen at Med center yesterday, got a little bit of pain relief, but pain came back with ambulation before leaving med center. Pt reports pain 10/10 in left leg. Pt tearful.

## 2013-11-12 NOTE — ED Notes (Signed)
Pt ambulated to BR w/o assistance and with little difficulty. Pt rates pain 8/10.

## 2013-11-12 NOTE — ED Provider Notes (Addendum)
CSN: [id number]     Arrival date & time 11/12/13  1246 History   First MD Initiated Contact with Patient 11/12/13 1342     Chief Complaint  Patient presents with  . Leg Pain    HPI Patient presents emergency department because of increasing left leg pain with radiation from her left back.  She's been seen in the emergency department by her primary care physician for this.  She had an MRI several days ago demonstrating a new herniated disc at L4-5 which is one level lower than her prior laminectomy.  She's been on Percocet and Celebrex and Neurontin without improvement in her symptoms.  She was seen in the emergency department yesterday and her pain was treated.  She states when she returned home her pain worsened again.  No fevers or chills.  Pain is severe in severity.  Any weakness of her left leg.  No urinary bladder complaints.    Past Medical History  Diagnosis Date  . Allergy   . Headache(784.0)   . GERD (gastroesophageal reflux disease)   . Polycystic ovaries   . B12 deficiency   . Peripheral neuropathy   . Fibromyalgia   . Depression   . Migraine   . Anxiety    Past Surgical History  Procedure Laterality Date  . Appendectomy    . Abdominal hysterectomy    . Tonsillectomy    . Sinusotomy  12/14/06  . Wrist ganglion excision    . Ovarian cyst removal    . Oophorectomy      left overy  . Rectocele repair    . Bladder tack  2012  . Back surgery  2012    left laminectomy at L3-4 per Dr. Phoebe Perch    Family History  Problem Relation Age of Onset  . Fibromyalgia Mother   . Diabetes Mother   . Cancer Mother     thyrod  . Liver disease Mother   . Neuropathy Mother   . Hypertension Father   . Diabetes Father   . Heart disease Father    History  Substance Use Topics  . Smoking status: Former Smoker    Quit date: 06/01/1998  . Smokeless tobacco: Never Used  . Alcohol Use: No   OB History   Grav Para Term Preterm Abortions TAB SAB Ect Mult Living   2 2  2  0     2      Review of Systems  All other systems reviewed and are negative.     Allergies  Gadolinium derivatives  Home Medications   Prior to Admission medications   Medication Sig Start Date End Date Taking? Authorizing Provider  ALPRAZolam [name]) 0.5 MG tablet Take 1 tablet (0.5 mg total) by mouth 3 (three) times daily as needed. 02/09/13  Yes [name], MD  celecoxib (CELEBREX) 200 MG capsule Take 200 mg by mouth 2 (two) times daily.   Yes Historical Provider, MD  cyanocobalamin (,VITAMIN B-12,) 1000 MCG/ML injection Inject 1 mL (1,000 mcg total) into the muscle every 7 (seven) days. 10/12/13  Yes [name], MD  cyclobenzaprine (FLEXERIL) 10 MG tablet Take 10 mg by mouth every 8 (eight) hours as needed for muscle spasms. PATIENT TAKES FOR MUSCLE SPASMS   Yes Historical Provider, MD  fluticasone (FLONASE) 50 MCG/ACT nasal spray Place 2 sprays into both nostrils daily.   Yes Historical Provider, MD  gabapentin (NEURONTIN) 300 MG capsule Take 1 capsule (300 mg total) by mouth 3 (three) times daily. 08/09/13  Yes Nelwyn SalisburyStephen A Fry, MD  hydrochlorothiazide (HYDRODIURIL) 25 MG tablet Take 25 mg by mouth daily.  02/08/13  Yes Historical Provider, MD  Multiple Vitamins-Minerals (MULTI-VITAMIN GUMMIES PO) Take 2 tablets by mouth daily.    Yes Historical Provider, MD  nortriptyline (PAMELOR) 50 MG capsule Take 1 capsule (50 mg total) by mouth at bedtime. 10/24/13  Yes Nelwyn SalisburyStephen A Fry, MD  oxyCODONE-acetaminophen (PERCOCET) 10-325 MG per tablet Take 1 tablet by mouth every 8 (eight) hours as needed for pain. 10/24/13  Yes Nelwyn SalisburyStephen A Fry, MD  pantoprazole (PROTONIX) 40 MG tablet Take 1 tablet (40 mg total) by mouth 2 (two) times daily. 10/12/13  Yes Nelwyn SalisburyStephen A Fry, MD  phentermine (ADIPEX-P) 37.5 MG tablet Take 37.5 mg by mouth daily before breakfast.   Yes Historical Provider, MD  potassium chloride (KLOR-CON 10) 10 MEQ tablet Take 1 tablet (10 mEq total) by mouth daily. 02/27/13  Yes Nelwyn SalisburyStephen A Fry, MD   predniSONE (DELTASONE) 20 MG tablet Take 1 tablet (20 mg total) by mouth 2 (two) times daily. 11/02/13  Yes Nelwyn SalisburyStephen A Fry, MD  promethazine (PHENERGAN) 25 MG tablet Take 1 tablet (25 mg total) by mouth every 6 (six) hours as needed for nausea. PATIENT TAKES FOR NAUSEA 10/12/13  Yes Nelwyn SalisburyStephen A Fry, MD  SUMAtriptan (IMITREX) 25 MG tablet Take 25 mg by mouth every 2 (two) hours as needed for migraine or headache. May repeat in 2 hours if headache persists or recurs.   Yes Historical Provider, MD  fluconazole (DIFLUCAN) 150 MG tablet Take 150 mg by mouth daily.    Historical Provider, MD   BP 162/103  Pulse 126  Temp(Src) 98.2 F (36.8 C) (Oral)  Resp 18  SpO2 98%  LMP 12/13/1999 Physical Exam  Nursing note and vitals reviewed. Constitutional: She is oriented to person, place, and time. She appears well-developed and well-nourished. No distress.  HENT:  Head: Normocephalic and atraumatic.  Eyes: EOM are normal.  Neck: Normal range of motion.  Cardiovascular: Normal rate, regular rhythm and normal heart sounds.   Pulmonary/Chest: Effort normal and breath sounds normal.  Abdominal: Soft. She exhibits no distension. There is no tenderness.  Musculoskeletal: Normal range of motion.  5 out of 5 strength in bilateral lower extremity major muscle groups.  Normal pulses her left foot.  No unilateral leg swelling of left as compared to right  Neurological: She is alert and oriented to person, place, and time.  Skin: Skin is warm and dry.  Psychiatric: She has a normal mood and affect. Judgment normal.    ED Course  Procedures (including critical care time)   MRI lumbar spine 10/31/2013 FINDINGS:  Normal signal is present in the conus medullaris which terminates at  T12-L1, within normal limits. Marrow signal, vertebral body heights,  and alignment are normal.  Limited imaging of the abdomen is unremarkable. The paraspinous  musculature is within normal limits.  The disc levels at L2-3 and  above are normal.  L3-4: The patient is status post left laminectomy. Residual or  recurrent broad-based disc protrusion is asymmetric to the left.  Facet hypertrophy is worse on the left. Mild left lateral recess  narrowing is similar to the prior study. The foramina are patent.  L4-5: A leftward disc protrusion is present. Mild facet hypertrophy  is evident. Mild to moderate left lateral recess stenosis has  progressed since the prior exam. The foramina are patent.  L5-S1: Moderate facet hypertrophy is present bilaterally. A shallow  a rightward disc  protrusion is evident. There is no significant  stenosis.  IMPRESSION:  1. Progressive leftward disc protrusion at L4-5 with mild to  moderate left lateral recess stenosis.  2. Status post left laminectomy at L3-4. Left lateral recess  narrowing at L3-4 is stable.  3. Facet hypertrophy and a shallow rightward disc protrusion at  L5-S1 is stable without significant stenosis.   Labs Review Labs Reviewed - No data to display       MDM   Final diagnoses:  None    3:40 PM We'll continue to attempt to control patient's pain emergency department.  Once we get her pain improved I will send her home with a prescription for when necessary oxycodone without Tylenol to take in addition to her Percocet as prescribed by her primary care physician.  This will be a short course only so she can followup with her Dr. and the neurosurgery team this week.  No bowel or bladder complaints.  Doubt cauda equina.  Doubt spinal epidural abscess.  3:41 PM Care to Dr Wilkie AyeHorton to follow up on pain control  Lyanne CoKevin M Kasondra Junod, MD 11/12/13 1541  Lyanne CoKevin M Dalana Pfahler, MD 11/12/13 [id number]

## 2013-11-12 NOTE — ED Notes (Addendum)
Pt has had recent MRI d/t chronic back pain. Pt is unable to see her neuro surgeon until June 25th. Pt is having sudden onset increased, severe back pain that radiates up and down posterior L leg. Pt states that she is unable to sleep, perform ADL's. Pt is A&O and in NAD. Pt significant other at bedside. Pt denies heat, redness, swelling to L leg

## 2013-11-12 NOTE — ED Provider Notes (Signed)
Patient signed out from Dr. Patria Maneampos.  Patient with history of chronic back pain and recent MRI with herniated disc. Patient has had persistent pain. No evidence of cauda equina on exam. On recheck, patient still reporting 8/10 pain following multiple doses of Dilaudid, Valium. Patient was given additional Dilaudid as well as 600 mg of gabapentin.  She was also given IV Valium for muscle relaxant.  7:27 PM On recheck, patient reported her pain 5/10. Discussed with patient that I would not likely be able to get her pain to 0. We'll add by mouth Valium for muscle relaxant at home. Patient continued Percocet, anti-inflammatories, prednisone, and gabapentin at home as already prescribed. Patient was provided a breakthrough oxycodone prescription from Dr. Patria Maneampos.  Stressed to the patient the importance of follow up closely with her neurosurgeon. She's not to drive or operate machinery while under the influence of narcotics or benzodiazepines.  After history, exam, and medical workup I feel the patient has been appropriately medically screened and is safe for discharge home. Pertinent diagnoses were discussed with the patient. Patient was given return precautions.     Hannah Batonourtney F Helen Cuff, MD 11/12/13 [id number]

## 2013-11-13 ENCOUNTER — Telehealth: Payer: Self-pay | Admitting: Family Medicine

## 2013-11-13 NOTE — Telephone Encounter (Signed)
Pt was seen at [facility] yesterday for leg pain. Pt has appt with NS on 11-17-13. Pt was given some ?oxycodone at er and hus stating med is not working.  Pt hus is requesting something stronger

## 2013-11-14 MED ORDER — DIAZEPAM 5 MG PO TABS: 5.0000 mg | ORAL_TABLET | Freq: Three times a day (TID) | ORAL | Status: DC | PRN

## 2013-11-14 MED ORDER — OXYCODONE-ACETAMINOPHEN 10-325 MG PO TABS: 1.0000 | ORAL_TABLET | Freq: Three times a day (TID) | ORAL | Status: DC | PRN

## 2013-11-14 NOTE — Telephone Encounter (Signed)
done

## 2013-11-14 NOTE — Telephone Encounter (Signed)
Pt calling back about refill.  °

## 2013-11-14 NOTE — Telephone Encounter (Signed)
Refill is ready  

## 2013-11-14 NOTE — Telephone Encounter (Signed)
Patient notified to pick up both prescriptions at the front desk.

## 2013-11-14 NOTE — Telephone Encounter (Signed)
That is as strong a medication as I ever write for

## 2013-11-14 NOTE — Telephone Encounter (Signed)
Pt would like to know if Dr. Clent RidgesFry will give her enough Valium for muscle spasms until her appt with the neurosurgereon.

## 2013-11-25 ENCOUNTER — Other Ambulatory Visit: Payer: Self-pay | Admitting: Family Medicine

## 2013-11-27 ENCOUNTER — Telehealth: Payer: Self-pay | Admitting: Family Medicine

## 2013-11-27 DIAGNOSIS — M545 Low back pain: Secondary | ICD-10-CM

## 2013-11-27 NOTE — Telephone Encounter (Signed)
Pt is needing new rx celecoxib (CELEBREX) 200 MG capsule and oxyodone-acetaminophen (percocet) 10-325, pt states dr. Glee Arvinkram advised her to take 2 every 4 to 6 hrs, however dr. Clent RidgesFry had written on the rx 1 every 6 hrs. Pt was taking 2 every 4 to 6 hrs because she is still in a lot of pain, however she will run out tomorrow and need another refill on tomorrow.

## 2013-11-28 NOTE — Telephone Encounter (Signed)
Pt requesting a short term refill for Percocet. I explained that if there was any dosage changes that Dr. Clent RidgesFry would take a look at when he returns, however pt will need refill before that.

## 2013-11-28 NOTE — Telephone Encounter (Signed)
I called and requested office note and they will fax over to us.

## 2013-11-28 NOTE — Telephone Encounter (Signed)
Can disp 20 only PCP NA.  Get copy of last note or advice from Dr Glee ArvinKram.

## 2013-11-28 NOTE — Telephone Encounter (Signed)
Also pt said that Protonix did not go through because pt is taking twice a day. We need to check into this and let pt know.

## 2013-11-29 MED ORDER — OXYCODONE-ACETAMINOPHEN 10-325 MG PO TABS: 1.0000 | ORAL_TABLET | Freq: Three times a day (TID) | ORAL | Status: DC | PRN

## 2013-11-29 NOTE — Telephone Encounter (Signed)
After Dr. Fabian SharpPanosh reviewed office note from Dr. Penelope GalasBarko, she decided that she could write for # 4 tablets of the Percocet to last until Dr. Clent RidgesFry returns to office on 11/30/13.

## 2013-11-29 NOTE — Telephone Encounter (Signed)
I spoke pt and printed script. I will forward the request for Celebrex and refill on Percocet. Pt is going to contact pharmacy about the Protonix and have another prior authorization filled out. Also pt needs to wean off of the Prednisone, please advise?

## 2013-11-30 ENCOUNTER — Telehealth: Payer: Self-pay | Admitting: Family Medicine

## 2013-11-30 MED ORDER — PREDNISONE 10 MG PO TABS
10.0000 mg | ORAL_TABLET | Freq: Two times a day (BID) | ORAL | Status: DC
Start: 2013-11-30 — End: 2014-01-10

## 2013-11-30 MED ORDER — OXYCODONE-ACETAMINOPHEN 10-325 MG PO TABS: 1.0000 | ORAL_TABLET | Freq: Four times a day (QID) | ORAL | Status: DC | PRN

## 2013-11-30 MED ORDER — CELECOXIB 200 MG PO CAPS: 200.0000 mg | ORAL_CAPSULE | Freq: Two times a day (BID) | ORAL | Status: DC

## 2013-11-30 NOTE — Telephone Encounter (Signed)
I have reduced the prednisone to 10 mg bid for now. I wrote for #60 of Percocet temporarily. I formally referred her to Dr. Murray HodgkinsBartko and have asked him to take over her pain medication

## 2013-11-30 NOTE — Telephone Encounter (Signed)
I spoke with pt and went over the below information. 

## 2013-11-30 NOTE — Telephone Encounter (Signed)
Pt wants to know about pain medication and how to stop her steroid medication?? She would like a callback.

## 2013-11-30 NOTE — Telephone Encounter (Signed)
Take the Prednisone 10 mg tablets a whole tab bid for 3 days, then take 1/2 tab bid for 3 days, then 1/2 tab once a day for 3 days, then stop

## 2013-11-30 NOTE — Telephone Encounter (Signed)
This is a duplicate note, please see previous one.

## 2013-11-30 NOTE — Telephone Encounter (Signed)
Script is ready for pick up, Celebrex and [facility]one was sent to [facility]. Pt said that she had her 1st injection on 11/29/13 with Dr. Murray HodgkinsBartko and she is scheduled to have 2nd injection in 2 weeks. Per pt and Dr. Murray HodgkinsBartko, pt must be weaned off of the Prednisone so that these injections will be helpful. Please advise? Pt stated that yesterdays injection did not help at all and in fact she is in worse pain now.

## 2013-12-11 ENCOUNTER — Other Ambulatory Visit: Payer: Self-pay | Admitting: *Deleted

## 2013-12-11 MED ORDER — NORTRIPTYLINE HCL 50 MG PO CAPS: 50.0000 mg | ORAL_CAPSULE | Freq: Every day | ORAL | Status: DC

## 2013-12-19 ENCOUNTER — Ambulatory Visit: Payer: BC Managed Care – PPO | Admitting: Neurology

## 2014-01-10 ENCOUNTER — Encounter (HOSPITAL_COMMUNITY): Payer: Self-pay | Admitting: Emergency Medicine

## 2014-01-10 ENCOUNTER — Telehealth: Payer: Self-pay | Admitting: Family Medicine

## 2014-01-10 ENCOUNTER — Inpatient Hospital Stay (HOSPITAL_COMMUNITY)
Admission: EM | Admit: 2014-01-10 | Discharge: 2014-01-13 | DRG: 552 | Disposition: A | Discharge status: Home / Self Care | Payer: BC Managed Care – PPO | Attending: Internal Medicine | Admitting: Internal Medicine

## 2014-01-10 DIAGNOSIS — G609 Hereditary and idiopathic neuropathy, unspecified: Secondary | ICD-10-CM | POA: Diagnosis present

## 2014-01-10 DIAGNOSIS — Z87891 Personal history of nicotine dependence: Secondary | ICD-10-CM

## 2014-01-10 DIAGNOSIS — M549 Dorsalgia, unspecified: Secondary | ICD-10-CM | POA: Diagnosis present

## 2014-01-10 DIAGNOSIS — M5430 Sciatica, unspecified side: Secondary | ICD-10-CM | POA: Diagnosis not present

## 2014-01-10 DIAGNOSIS — M5126 Other intervertebral disc displacement, lumbar region: Principal | ICD-10-CM | POA: Diagnosis present

## 2014-01-10 DIAGNOSIS — K219 Gastro-esophageal reflux disease without esophagitis: Secondary | ICD-10-CM | POA: Diagnosis present

## 2014-01-10 DIAGNOSIS — IMO0001 Reserved for inherently not codable concepts without codable children: Secondary | ICD-10-CM | POA: Diagnosis present

## 2014-01-10 DIAGNOSIS — M5432 Sciatica, left side: Secondary | ICD-10-CM

## 2014-01-10 DIAGNOSIS — Z6838 Body mass index (BMI) 38.0-38.9, adult: Secondary | ICD-10-CM

## 2014-01-10 DIAGNOSIS — IMO0002 Reserved for concepts with insufficient information to code with codable children: Secondary | ICD-10-CM | POA: Diagnosis present

## 2014-01-10 DIAGNOSIS — I10 Essential (primary) hypertension: Secondary | ICD-10-CM | POA: Diagnosis present

## 2014-01-10 DIAGNOSIS — M545 Low back pain: Secondary | ICD-10-CM

## 2014-01-10 DIAGNOSIS — F411 Generalized anxiety disorder: Secondary | ICD-10-CM | POA: Diagnosis present

## 2014-01-10 DIAGNOSIS — E669 Obesity, unspecified: Secondary | ICD-10-CM | POA: Diagnosis present

## 2014-01-10 DIAGNOSIS — D72829 Elevated white blood cell count, unspecified: Secondary | ICD-10-CM | POA: Diagnosis present

## 2014-01-10 DIAGNOSIS — Z79899 Other long term (current) drug therapy: Secondary | ICD-10-CM

## 2014-01-10 DIAGNOSIS — G43909 Migraine, unspecified, not intractable, without status migrainosus: Secondary | ICD-10-CM | POA: Diagnosis present

## 2014-01-10 DIAGNOSIS — M791 Myalgia, unspecified site: Secondary | ICD-10-CM | POA: Diagnosis present

## 2014-01-10 DIAGNOSIS — T380X5A Adverse effect of glucocorticoids and synthetic analogues, initial encounter: Secondary | ICD-10-CM | POA: Diagnosis present

## 2014-01-10 HISTORY — DX: Reserved for concepts with insufficient information to code with codable children: IMO0002

## 2014-01-10 MED ORDER — OXYCODONE-ACETAMINOPHEN 5-325 MG PO TABS
2.0000 | ORAL_TABLET | Freq: Once | ORAL | Status: AC
  Administered 2014-01-10: 2 via ORAL
  Filled 2014-01-10: qty 2

## 2014-01-10 MED ORDER — HYDROMORPHONE HCL PF 2 MG/ML IJ SOLN
2.0000 mg | Freq: Once | INTRAMUSCULAR | Status: AC
  Administered 2014-01-10: 2 mg via INTRAMUSCULAR
  Filled 2014-01-10: qty 1

## 2014-01-10 MED ORDER — PREDNISONE 20 MG PO TABS
60.0000 mg | ORAL_TABLET | Freq: Once | ORAL | Status: AC
  Administered 2014-01-10: 60 mg via ORAL
  Filled 2014-01-10: qty 3

## 2014-01-10 MED ORDER — METHOCARBAMOL 500 MG PO TABS
500.0000 mg | ORAL_TABLET | Freq: Once | ORAL | Status: AC
  Administered 2014-01-10: 500 mg via ORAL
  Filled 2014-01-10: qty 1

## 2014-01-10 MED ORDER — DIAZEPAM 5 MG PO TABS
10.0000 mg | ORAL_TABLET | Freq: Once | ORAL | Status: AC
  Administered 2014-01-10: 10 mg via ORAL
  Filled 2014-01-10: qty 2

## 2014-01-10 NOTE — ED Provider Notes (Signed)
CSN: [id number]     Arrival date & time 01/10/14  1611 History   First MD Initiated Contact with Patient 01/10/14 1909     Chief Complaint  Patient presents with  . Back Pain  . Leg Pain     (Consider location/radiation/quality/duration/timing/severity/associated sxs/prior Treatment) HPI 38 year old female with history of fibromyalgia, herniated disc at the level of L4-5 who presents for evaluation of low back pain that radiates to her left leg. Patient has been having recurrent low back pain and left leg pain requiring pain medication, muscle relaxant, and steroids in the past. She had a steroid injection a week ago that provide relief for approximately 2 days but now the pain is worsened. She described the pain as a sharp shooting pain from her low back, radiates to her left buttock and down to the lateral aspects of the thigh extended to her feet. Pain is worsened with certain movement. She has to sit in a certain position to alleviate her pain. She is having difficulty walking secondary to the pain, this has been for the past several months. No fever, chills, abdominal pain, dysuria, no urinary or bowel incontinence, or saddle paresthesia.  No history of IV drug use or cancer. Report pins/needle sensation throughout her L leg from her calf down to the feet. She has ran out of her pain and muscle relaxant medication since yesterday. She called the neurosurgeon for an outpatient followup or not be seen until the end of this month. No recent injury.  Sts she has been mostly bedbound because walking is very difficult.  Pt has been having trouble ambulating for several months.      Past Medical History  Diagnosis Date  . Allergy   . Headache(784.0)   . GERD (gastroesophageal reflux disease)   . Polycystic ovaries   . B12 deficiency   . Peripheral neuropathy   . Fibromyalgia   . Depression   . Migraine   . Anxiety    Past Surgical History  Procedure Laterality Date  . Appendectomy     . Abdominal hysterectomy    . Tonsillectomy    . Sinusotomy  12/14/06  . Wrist ganglion excision    . Ovarian cyst removal    . Oophorectomy      left overy  . Rectocele repair    . Bladder tack  2012  . Back surgery  2012    left laminectomy at L3-4 per Dr. Phoebe PerchHirsch    Family History  Problem Relation Age of Onset  . Fibromyalgia Mother   . Diabetes Mother   . Cancer Mother     thyrod  . Liver disease Mother   . Neuropathy Mother   . Hypertension Father   . Diabetes Father   . Heart disease Father    History  Substance Use Topics  . Smoking status: Former Smoker    Quit date: 06/01/1998  . Smokeless tobacco: Never Used  . Alcohol Use: No   OB History   Grav Para Term Preterm Abortions TAB SAB Ect Mult Living   2 2  2  0     2     Review of Systems  Constitutional: Negative for fever.  Musculoskeletal: Positive for back pain.  Skin: Negative for rash and wound.  Neurological: Positive for numbness.      Allergies  Gadolinium derivatives  Home Medications   Prior to Admission medications   Medication Sig Start Date End Date Taking? Authorizing Provider  ALPRAZolam [name](XANAX) 0.5 MG tablet  Take 1 tablet (0.5 mg total) by mouth 3 (three) times daily as needed. 02/09/13   [name], MD  celecoxib (CELEBREX) 200 MG capsule Take 1 capsule (200 mg total) by mouth 2 (two) times daily. 11/30/13   [name], MD  cyanocobalamin (,VITAMIN B-12,) 1000 MCG/ML injection Inject 1 mL (1,000 mcg total) into the muscle every 7 (seven) days. 10/12/13   [name], MD  cyclobenzaprine (FLEXERIL) 10 MG tablet Take 10 mg by mouth every 8 (eight) hours as needed for muscle spasms. PATIENT TAKES FOR MUSCLE SPASMS    Historical Provider, MD  diazepam (VALIUM) 5 MG tablet Take 1 tablet (5 mg total) by mouth every 8 (eight) hours as needed for muscle spasms. 11/14/13   [name], MD  fluticasone (FLONASE) 50 MCG/ACT nasal spray Place 2 sprays into both nostrils daily.    Historical  Provider, MD  gabapentin (NEURONTIN) 300 MG capsule Take 1 capsule (300 mg total) by mouth 3 (three) times daily. 08/09/13   [name], MD  hydrochlorothiazide (HYDRODIURIL) 25 MG tablet Take 25 mg by mouth daily.  02/08/13   Historical Provider, MD  Multiple Vitamins-Minerals (MULTI-VITAMIN GUMMIES PO) Take 2 tablets by mouth daily.     Historical Provider, MD  nortriptyline (PAMELOR) 50 MG capsule Take 1 capsule (50 mg total) by mouth at bedtime. 12/11/13   Adam Gus Rankin, DO  oxyCODONE-acetaminophen (PERCOCET) 10-325 MG per tablet Take 1 tablet by mouth every 6 (six) hours as needed for pain. 11/30/13   [name], MD  pantoprazole (PROTONIX) 40 MG tablet Take 1 tablet (40 mg total) by mouth 2 (two) times daily. 10/12/13   [name], MD  phentermine (ADIPEX-P) 37.5 MG tablet Take 37.5 mg by mouth daily before breakfast.    Historical Provider, MD  potassium chloride (KLOR-CON 10) 10 MEQ tablet Take 1 tablet (10 mEq total) by mouth daily. 02/27/13   [name], MD  promethazine (PHENERGAN) 25 MG tablet Take 1 tablet (25 mg total) by mouth every 6 (six) hours as needed for nausea. PATIENT TAKES FOR NAUSEA 10/12/13   [name], MD  SUMAtriptan (IMITREX) 25 MG tablet Take 25 mg by mouth every 2 (two) hours as needed for migraine or headache. May repeat in 2 hours if headache persists or recurs.    Historical Provider, MD   BP 168/102  Pulse 115  Temp(Src) 98.9 F (37.2 C) (Oral)  Resp 16  SpO2 96%  LMP 12/13/1999 Physical Exam  Nursing note and vitals reviewed. Constitutional: She appears well-developed and well-nourished. No distress.  Moderately obese caucasian female, tearful, sitting on the side of the bed with her L leg dangle on the side of the bed.    HENT:  Head: Atraumatic.  Eyes: Conjunctivae are normal.  Neck: Neck supple.  Cardiovascular:  Mild tachycardia without M/R/G  Pulmonary/Chest: Effort normal and breath sounds normal.  Abdominal: Soft. There is no  tenderness.  Musculoskeletal: She exhibits no edema.  Tenderness to lspine and paralumbar region on palpation, positive straight leg raise, L>R.  Patella DTR 2+, pedal pulse palpable bilaterally, cap refill 3sec on L foot, sluggish as compare to R foot.  No foot drops  Neurological: She is alert.  Skin: No rash noted.  Psychiatric: She has a normal mood and affect.    ED Course  Procedures (including critical care time)  Pt with acute on chronic lower back pain with L radicular leg pain suspicious of sciatica.  Pt is NVI.  No red flags.  Having difficulty ambulating but able to ambulate.  Pain is reproducible. Known hx of herniated disc.  Will provide sxs treatment.    12:00 AM Despite multiple doses of medications (4mg  dilaudid, 2 percocets, 10mg  valium, 60mg  prednisone, 500mg  robaxin)  in attempt to provide pain relief, pt continues to endorse moderate pain, unable to ambulate, burning sensation to her LLE.  She request to be admitted for further management of her pain.    1:09 AM I have consulted with [facility], Dr. Izola Price who request for UDS and will admit pt to med surg, team 8, under her care.    Labs Review Labs Reviewed  CBC WITH DIFFERENTIAL - Abnormal; Notable for the following:    WBC 16.9 (*)    Hemoglobin 15.2 (*)    Neutrophils Relative % 91 (*)    Neutro Abs 15.4 (*)    Lymphocytes Relative 8 (*)    Monocytes Relative 1 (*)    All other components within normal limits  I-STAT CHEM 8, ED - Abnormal; Notable for the following:    Glucose, Bld 134 (*)    All other components within normal limits  URINALYSIS, ROUTINE W REFLEX MICROSCOPIC  POC URINE PREG, ED    Imaging Review No results found.   EKG Interpretation None      MDM   Final diagnoses:  Sciatica, left    BP 122/94  Pulse 108  Temp(Src) 98 F (36.7 C) (Oral)  Resp 20  SpO2 100%  LMP 12/13/1999   I have reviewed nursing notes and vital signs. I reviewed available ER/hospitalization  records thought the EMR      Fayrene Helper, [place] 01/11/14 0123

## 2014-01-10 NOTE — ED Notes (Signed)
She states she has had on-going issues with back pain since ~[place] Day this year.  She states she has benefited from some recent epidural injections until this Mon. When her pain recurred "more than ever".  She also states this pain is so bad that she "can barely walk".  She also has noted a change in the pain in that it "shoots up my back--and that's new it's never done that before". CMS intact all toes bilat.

## 2014-01-10 NOTE — Telephone Encounter (Signed)
Call in #60 with 5 rf 

## 2014-01-10 NOTE — Telephone Encounter (Signed)
[id number]- [place], [place] - [address] is requesting re-fill on diazepam (VALIUM) 5 MG tablet

## 2014-01-10 NOTE — ED Notes (Signed)
Pt reports L low back pain radiating down to her L leg, unable to ambulated.  Pt reports having an epidural injection x 1 week ago, got relief x 2 days and pain recurred.  Pt reports callling her neuro surgeon today and is still waiting for a call back.

## 2014-01-11 ENCOUNTER — Encounter (HOSPITAL_COMMUNITY): Payer: Self-pay | Admitting: Internal Medicine

## 2014-01-11 ENCOUNTER — Inpatient Hospital Stay (HOSPITAL_COMMUNITY): Payer: BC Managed Care – PPO

## 2014-01-11 DIAGNOSIS — Z79899 Other long term (current) drug therapy: Secondary | ICD-10-CM | POA: Diagnosis not present

## 2014-01-11 DIAGNOSIS — Z6838 Body mass index (BMI) 38.0-38.9, adult: Secondary | ICD-10-CM | POA: Diagnosis not present

## 2014-01-11 DIAGNOSIS — D72829 Elevated white blood cell count, unspecified: Secondary | ICD-10-CM | POA: Diagnosis present

## 2014-01-11 DIAGNOSIS — F411 Generalized anxiety disorder: Secondary | ICD-10-CM | POA: Diagnosis present

## 2014-01-11 DIAGNOSIS — M5430 Sciatica, unspecified side: Secondary | ICD-10-CM

## 2014-01-11 DIAGNOSIS — G43909 Migraine, unspecified, not intractable, without status migrainosus: Secondary | ICD-10-CM | POA: Diagnosis present

## 2014-01-11 DIAGNOSIS — M549 Dorsalgia, unspecified: Secondary | ICD-10-CM | POA: Diagnosis present

## 2014-01-11 DIAGNOSIS — I10 Essential (primary) hypertension: Secondary | ICD-10-CM | POA: Diagnosis present

## 2014-01-11 DIAGNOSIS — IMO0001 Reserved for inherently not codable concepts without codable children: Secondary | ICD-10-CM | POA: Diagnosis present

## 2014-01-11 DIAGNOSIS — Z87891 Personal history of nicotine dependence: Secondary | ICD-10-CM | POA: Diagnosis not present

## 2014-01-11 DIAGNOSIS — M5126 Other intervertebral disc displacement, lumbar region: Principal | ICD-10-CM

## 2014-01-11 DIAGNOSIS — G609 Hereditary and idiopathic neuropathy, unspecified: Secondary | ICD-10-CM | POA: Diagnosis present

## 2014-01-11 DIAGNOSIS — IMO0002 Reserved for concepts with insufficient information to code with codable children: Secondary | ICD-10-CM | POA: Diagnosis present

## 2014-01-11 DIAGNOSIS — K219 Gastro-esophageal reflux disease without esophagitis: Secondary | ICD-10-CM | POA: Diagnosis present

## 2014-01-11 DIAGNOSIS — T380X5A Adverse effect of glucocorticoids and synthetic analogues, initial encounter: Secondary | ICD-10-CM | POA: Diagnosis present

## 2014-01-11 HISTORY — DX: Reserved for concepts with insufficient information to code with codable children: IMO0002

## 2014-01-11 LAB — I-STAT CHEM 8, ED
BUN: 22 mg/dL (ref 6–23)
Calcium, Ion: 1.12 mmol/L (ref 1.12–1.23)
Chloride: 100 mEq/L (ref 96–112)
Creatinine, Ser: 0.9 mg/dL (ref 0.50–1.10)
Glucose, Bld: 134 mg/dL — ABNORMAL HIGH (ref 70–99)
HCT: 44 % (ref 36.0–46.0)
Hemoglobin: 15 g/dL (ref 12.0–15.0)
Potassium: 4.2 mEq/L (ref 3.7–5.3)
Sodium: 138 mEq/L (ref 137–147)
TCO2: 24 mmol/L (ref 0–100)

## 2014-01-11 LAB — CBC WITH DIFFERENTIAL/PLATELET
Basophils Absolute: 0 10*3/uL (ref 0.0–0.1)
Basophils Relative: 0 % (ref 0–1)
Eosinophils Absolute: 0 10*3/uL (ref 0.0–0.7)
Eosinophils Relative: 0 % (ref 0–5)
HCT: 43.8 % (ref 36.0–46.0)
Hemoglobin: 15.2 g/dL — ABNORMAL HIGH (ref 12.0–15.0)
Lymphocytes Relative: 8 % — ABNORMAL LOW (ref 12–46)
Lymphs Abs: 1.3 10*3/uL (ref 0.7–4.0)
MCH: 31.3 pg (ref 26.0–34.0)
MCHC: 34.7 g/dL (ref 30.0–36.0)
MCV: 90.1 fL (ref 78.0–100.0)
Monocytes Absolute: 0.2 10*3/uL (ref 0.1–1.0)
Monocytes Relative: 1 % — ABNORMAL LOW (ref 3–12)
Neutro Abs: 15.4 10*3/uL — ABNORMAL HIGH (ref 1.7–7.7)
Neutrophils Relative %: 91 % — ABNORMAL HIGH (ref 43–77)
Platelets: 351 10*3/uL (ref 150–400)
RBC: 4.86 MIL/uL (ref 3.87–5.11)
RDW: 13.4 % (ref 11.5–15.5)
WBC: 16.9 10*3/uL — ABNORMAL HIGH (ref 4.0–10.5)

## 2014-01-11 LAB — CBC
HCT: 43.9 % (ref 36.0–46.0)
Hemoglobin: 15.1 g/dL — ABNORMAL HIGH (ref 12.0–15.0)
MCH: 31 pg (ref 26.0–34.0)
MCHC: 34.4 g/dL (ref 30.0–36.0)
MCV: 90.1 fL (ref 78.0–100.0)
Platelets: 371 10*3/uL (ref 150–400)
RBC: 4.87 MIL/uL (ref 3.87–5.11)
RDW: 13.4 % (ref 11.5–15.5)
WBC: 14.4 10*3/uL — ABNORMAL HIGH (ref 4.0–10.5)

## 2014-01-11 LAB — URINALYSIS, ROUTINE W REFLEX MICROSCOPIC
Bilirubin Urine: NEGATIVE
Glucose, UA: NEGATIVE mg/dL
Hgb urine dipstick: NEGATIVE
Ketones, ur: NEGATIVE mg/dL
Leukocytes, UA: NEGATIVE
Nitrite: NEGATIVE
Protein, ur: NEGATIVE mg/dL
Specific Gravity, Urine: 1.029 (ref 1.005–1.030)
Urobilinogen, UA: 0.2 mg/dL (ref 0.0–1.0)
pH: 6 (ref 5.0–8.0)

## 2014-01-11 LAB — RAPID URINE DRUG SCREEN, HOSP PERFORMED
Amphetamines: NOT DETECTED
Barbiturates: NOT DETECTED
Benzodiazepines: POSITIVE — AB
Cocaine: NOT DETECTED
Opiates: POSITIVE — AB
Tetrahydrocannabinol: NOT DETECTED

## 2014-01-11 LAB — BASIC METABOLIC PANEL
Anion gap: 12 (ref 5–15)
BUN: 23 mg/dL (ref 6–23)
CO2: 29 mEq/L (ref 19–32)
Calcium: 9.5 mg/dL (ref 8.4–10.5)
Chloride: 96 mEq/L (ref 96–112)
Creatinine, Ser: 0.84 mg/dL (ref 0.50–1.10)
GFR calc Af Amer: >90 mL/min (ref >90)
GFR calc non Af Amer: 86 mL/min — ABNORMAL LOW (ref >90)
Glucose, Bld: 154 mg/dL — ABNORMAL HIGH (ref 70–99)
Potassium: 4.4 mEq/L (ref 3.7–5.3)
Sodium: 137 mEq/L (ref 137–147)

## 2014-01-11 LAB — POC URINE PREG, ED: Preg Test, Ur: NEGATIVE

## 2014-01-11 MED ORDER — PANTOPRAZOLE SODIUM 40 MG PO TBEC
40.0000 mg | DELAYED_RELEASE_TABLET | Freq: Two times a day (BID) | ORAL | Status: DC
  Administered 2014-01-11 – 2014-01-13 (×5): 40 mg via ORAL
  Filled 2014-01-11 (×6): qty 1

## 2014-01-11 MED ORDER — HYDROMORPHONE HCL PF 1 MG/ML IJ SOLN
1.0000 mg | INTRAMUSCULAR | Status: DC | PRN
  Administered 2014-01-11: 1 mg via INTRAVENOUS
  Filled 2014-01-11: qty 1

## 2014-01-11 MED ORDER — HYDROMORPHONE HCL PF 1 MG/ML IJ SOLN
1.0000 mg | INTRAMUSCULAR | Status: DC | PRN
  Administered 2014-01-11 (×5): 1 mg via INTRAVENOUS
  Filled 2014-01-11 (×5): qty 1

## 2014-01-11 MED ORDER — CYCLOBENZAPRINE HCL 10 MG PO TABS
10.0000 mg | ORAL_TABLET | Freq: Three times a day (TID) | ORAL | Status: DC | PRN
  Administered 2014-01-11 – 2014-01-13 (×4): 10 mg via ORAL
  Filled 2014-01-11 (×5): qty 1

## 2014-01-11 MED ORDER — HYDROCHLOROTHIAZIDE 25 MG PO TABS
25.0000 mg | ORAL_TABLET | Freq: Every day | ORAL | Status: DC
  Administered 2014-01-11: 25 mg via ORAL
  Filled 2014-01-11 (×2): qty 1

## 2014-01-11 MED ORDER — GABAPENTIN 300 MG PO CAPS
300.0000 mg | ORAL_CAPSULE | Freq: Three times a day (TID) | ORAL | Status: DC
  Administered 2014-01-11 – 2014-01-13 (×8): 300 mg via ORAL
  Filled 2014-01-11 (×10): qty 1

## 2014-01-11 MED ORDER — DIAZEPAM 5 MG PO TABS
5.0000 mg | ORAL_TABLET | Freq: Four times a day (QID) | ORAL | Status: DC | PRN
  Administered 2014-01-11 – 2014-01-13 (×9): 5 mg via ORAL
  Filled 2014-01-11 (×9): qty 1

## 2014-01-11 MED ORDER — OXYCODONE-ACETAMINOPHEN 5-325 MG PO TABS
1.0000 | ORAL_TABLET | Freq: Four times a day (QID) | ORAL | Status: DC | PRN
  Administered 2014-01-11: 1 via ORAL
  Filled 2014-01-11: qty 1

## 2014-01-11 MED ORDER — ENOXAPARIN SODIUM 40 MG/0.4ML ~~LOC~~ SOLN: 40.0000 mg | SUBCUTANEOUS | Status: DC

## 2014-01-11 MED ORDER — HYDROMORPHONE HCL PF 2 MG/ML IJ SOLN
2.0000 mg | Freq: Once | INTRAMUSCULAR | Status: AC
  Administered 2014-01-11: 2 mg via INTRAVENOUS
  Filled 2014-01-11: qty 1

## 2014-01-11 MED ORDER — ALPRAZOLAM 1 MG PO TABS
1.0000 mg | ORAL_TABLET | Freq: Once | ORAL | Status: AC
  Administered 2014-01-11: 1 mg via ORAL
  Filled 2014-01-11: qty 1

## 2014-01-11 MED ORDER — KETOROLAC TROMETHAMINE 30 MG/ML IJ SOLN
30.0000 mg | Freq: Four times a day (QID) | INTRAMUSCULAR | Status: DC
  Administered 2014-01-11 – 2014-01-13 (×9): 30 mg via INTRAVENOUS
  Filled 2014-01-11 (×13): qty 1

## 2014-01-11 MED ORDER — PROMETHAZINE HCL 25 MG PO TABS: 25.0000 mg | ORAL_TABLET | Freq: Four times a day (QID) | ORAL | Status: DC | PRN

## 2014-01-11 MED ORDER — PREDNISONE 20 MG PO TABS
40.0000 mg | ORAL_TABLET | Freq: Every day | ORAL | Status: DC
  Administered 2014-01-12 – 2014-01-13 (×2): 40 mg via ORAL
  Filled 2014-01-11 (×3): qty 2

## 2014-01-11 MED ORDER — HYDROMORPHONE HCL 2 MG PO TABS
1.0000 mg | ORAL_TABLET | ORAL | Status: DC | PRN
  Administered 2014-01-12 – 2014-01-13 (×2): 2 mg via ORAL
  Filled 2014-01-11 (×3): qty 1

## 2014-01-11 MED ORDER — NORTRIPTYLINE HCL 25 MG PO CAPS
50.0000 mg | ORAL_CAPSULE | Freq: Every day | ORAL | Status: DC
  Administered 2014-01-11 – 2014-01-12 (×3): 50 mg via ORAL
  Filled 2014-01-11 (×4): qty 2

## 2014-01-11 MED ORDER — OXYCODONE HCL 5 MG PO TABS
5.0000 mg | ORAL_TABLET | Freq: Four times a day (QID) | ORAL | Status: DC | PRN
  Administered 2014-01-11 (×2): 5 mg via ORAL
  Filled 2014-01-11 (×2): qty 1

## 2014-01-11 MED ORDER — OXYCODONE-ACETAMINOPHEN 10-325 MG PO TABS: 1.0000 | ORAL_TABLET | Freq: Four times a day (QID) | ORAL | Status: DC | PRN

## 2014-01-11 MED ORDER — OXYCODONE HCL 5 MG PO TABS
5.0000 mg | ORAL_TABLET | ORAL | Status: DC | PRN
  Administered 2014-01-11 – 2014-01-13 (×7): 5 mg via ORAL
  Filled 2014-01-11 (×8): qty 1

## 2014-01-11 MED ORDER — HYDROMORPHONE HCL PF 2 MG/ML IJ SOLN
2.0000 mg | INTRAMUSCULAR | Status: DC | PRN
Start: 2014-01-11 — End: 2014-01-13
  Administered 2014-01-11 – 2014-01-13 (×10): 2 mg via INTRAVENOUS
  Filled 2014-01-11 (×11): qty 1

## 2014-01-11 MED ORDER — FLUTICASONE PROPIONATE 50 MCG/ACT NA SUSP
2.0000 | Freq: Every day | NASAL | Status: DC
  Administered 2014-01-12 – 2014-01-13 (×2): 2 via NASAL
  Filled 2014-01-11 (×2): qty 16

## 2014-01-11 MED ORDER — ALPRAZOLAM 0.5 MG PO TABS
0.5000 mg | ORAL_TABLET | Freq: Three times a day (TID) | ORAL | Status: DC | PRN
  Administered 2014-01-11: 0.5 mg via ORAL
  Filled 2014-01-11: qty 1

## 2014-01-11 MED ORDER — DIAZEPAM 5 MG PO TABS: 5.0000 mg | ORAL_TABLET | Freq: Three times a day (TID) | ORAL | Status: DC | PRN

## 2014-01-11 MED ORDER — OXYCODONE-ACETAMINOPHEN 5-325 MG PO TABS
1.0000 | ORAL_TABLET | ORAL | Status: DC | PRN
  Administered 2014-01-11 – 2014-01-13 (×7): 1 via ORAL
  Filled 2014-01-11 (×7): qty 1

## 2014-01-11 MED ORDER — ONDANSETRON HCL 4 MG/2ML IJ SOLN
4.0000 mg | Freq: Three times a day (TID) | INTRAMUSCULAR | Status: DC | PRN
  Administered 2014-01-11: 4 mg via INTRAVENOUS
  Filled 2014-01-11: qty 2

## 2014-01-11 MED ORDER — POTASSIUM CHLORIDE ER 10 MEQ PO TBCR
10.0000 meq | EXTENDED_RELEASE_TABLET | Freq: Every day | ORAL | Status: DC
  Administered 2014-01-11 – 2014-01-13 (×2): 10 meq via ORAL
  Filled 2014-01-11 (×3): qty 1

## 2014-01-11 MED ORDER — ENOXAPARIN SODIUM 60 MG/0.6ML ~~LOC~~ SOLN
60.0000 mg | Freq: Every day | SUBCUTANEOUS | Status: DC
  Administered 2014-01-11 – 2014-01-13 (×3): 60 mg via SUBCUTANEOUS
  Filled 2014-01-11 (×3): qty 0.6

## 2014-01-11 NOTE — H&P (Addendum)
[facility] History and Physical  [name] ZOX:[id number] DOB: 1975/06/12 DOA: 01/10/2014  Referring physician: ED physician PCP: [name], MD   Chief Complaint: back pain   HPI:  38 year old female with history of fibromyalgia, herniated disc at the level of L4-5 presented to [facility] ED with persistent low back pain, radiating to the left buttock area and lateral aspect of the left lower extremity, sharp and shooting, 10/10 in severity and worse with ambulation, associated with "pins and needles" sensation, no specific alleviating factors. Pt reports having been diagnosed with sciatica in the past but she has never had an episode as bad as this one. No fevers, chills, no drug use.  In ED, pt noted to be in pain and difficult to control on oral meds. [facility] asked to admit for pain control   Assessment and Plan: Active Problems:   Sciatica - admit to medical floor - place on IV analgesia and readjust the regimen as clinically indicated - has had recent MRI of the low back so will hold off on repeating imaging for now - consider re imaging if symptoms do not improve  - pt is on multiple sedating medications valium and ativan, also high dose narcotics, muscle relaxants - obtain UDS - place on Dilaudid 1 mg as needed  - pt wants to have her valium continued as she says it help with muscle spasms    HTN - continue home medical regimen   Leukocytosis - no clear infectious etiology - afebrile - repeat CBC in AM   DVT prophylaxis: Lovenox   Radiological Exams on Admission: No results found.   Code Status: Full Family Communication: Pt at bedside Disposition Plan: Admit for further evaluation     Review of Systems:  Constitutional: Negative for fever, chills and malaise/fatigue. Negative for diaphoresis.  HENT: Negative for hearing loss, ear pain, nosebleeds, congestion, sore throat, neck pain, tinnitus and ear discharge.   Eyes: Negative for blurred vision, double  vision, photophobia, pain, discharge and redness.  Respiratory: Negative for cough, hemoptysis, sputum production, shortness of breath, wheezing and stridor.   Cardiovascular: Negative for chest pain, palpitations, orthopnea, claudication and leg swelling.  Gastrointestinal: Negative for nausea, vomiting and abdominal pain. Negative for heartburn, constipation, blood in stool and melena.  Genitourinary: Negative for dysuria, urgency, frequency, hematuria and flank pain.  Musculoskeletal: Negative for myalgias, back pain, joint pain and falls.  Skin: Negative for itching and rash.  Neurological: Negative for dizziness and weakness. Endo/Heme/Allergies: Negative for environmental allergies and polydipsia. Does not bruise/bleed easily.  Psychiatric/Behavioral: Negative for suicidal ideas. The patient is not nervous/anxious.      Past Medical History  Diagnosis Date  . Allergy   . Headache(784.0)   . GERD (gastroesophageal reflux disease)   . Polycystic ovaries   . B12 deficiency   . Peripheral neuropathy   . Fibromyalgia   . Depression   . Migraine   . Anxiety     Past Surgical History  Procedure Laterality Date  . Appendectomy    . Abdominal hysterectomy    . Tonsillectomy    . Sinusotomy  12/14/06  . Wrist ganglion excision    . Ovarian cyst removal    . Oophorectomy      left overy  . Rectocele repair    . Bladder tack  2012  . Back surgery  2012    left laminectomy at L3-4 per Dr. Phoebe Perch     Social History:  reports that she quit smoking about 15  years ago. She has never used smokeless tobacco. She reports that she does not drink alcohol or use illicit drugs.  Allergies  Allergen Reactions  . Gadolinium Derivatives Swelling and Other (See Comments)    Arm swelling, tingling, & redness. Radiologist Dr. Grace IsaacWatts recommends an injection of steroids if patient needs a gadolinium based contrast in the future.    Family History  Problem Relation Age of Onset  . Fibromyalgia  Mother   . Diabetes Mother   . Cancer Mother     thyrod  . Liver disease Mother   . Neuropathy Mother   . Hypertension Father   . Diabetes Father   . Heart disease Father     Prior to Admission medications   Medication Sig Start Date End Date Taking? Authorizing Provider  ALPRAZolam [name](XANAX) 0.5 MG tablet Take 1 tablet (0.5 mg total) by mouth 3 (three) times daily as needed. 02/09/13  Yes Nelwyn SalisburyStephen A Fry, MD  celecoxib (CELEBREX) 200 MG capsule Take 1 capsule (200 mg total) by mouth 2 (two) times daily. 11/30/13  Yes Nelwyn SalisburyStephen A Fry, MD  Cholecalciferol (VITAMIN D3) 2000 UNITS TABS Take 1 tablet by mouth daily.   Yes Historical Provider, MD  cyanocobalamin (,VITAMIN B-12,) 1000 MCG/ML injection Inject 1 mL (1,000 mcg total) into the muscle every 7 (seven) days. 10/12/13  Yes Nelwyn SalisburyStephen A Fry, MD  cyclobenzaprine (FLEXERIL) 10 MG tablet Take 10 mg by mouth every 8 (eight) hours as needed for muscle spasms. PATIENT TAKES FOR MUSCLE SPASMS   Yes Historical Provider, MD  diazepam (VALIUM) 5 MG tablet Take 1 tablet (5 mg total) by mouth every 8 (eight) hours as needed for muscle spasms. 11/14/13  Yes Nelwyn SalisburyStephen A Fry, MD  fluticasone (FLONASE) 50 MCG/ACT nasal spray Place 2 sprays into both nostrils daily.   Yes Historical Provider, MD  gabapentin (NEURONTIN) 300 MG capsule Take 1 capsule (300 mg total) by mouth 3 (three) times daily. 08/09/13  Yes Nelwyn SalisburyStephen A Fry, MD  hydrochlorothiazide (HYDRODIURIL) 25 MG tablet Take 25 mg by mouth daily.  02/08/13  Yes Historical Provider, MD  Multiple Vitamins-Minerals (MULTI-VITAMIN GUMMIES PO) Take 2 tablets by mouth daily.    Yes Historical Provider, MD  nortriptyline (PAMELOR) 50 MG capsule Take 1 capsule (50 mg total) by mouth at bedtime. 12/11/13  Yes Adam Gus Rankinobert Jaffe, DO  oxyCODONE-acetaminophen (PERCOCET) 10-325 MG per tablet Take 1 tablet by mouth every 6 (six) hours as needed for pain. 11/30/13  Yes Nelwyn SalisburyStephen A Fry, MD  pantoprazole (PROTONIX) 40 MG tablet Take 1 tablet  (40 mg total) by mouth 2 (two) times daily. 10/12/13  Yes Nelwyn SalisburyStephen A Fry, MD  phentermine (ADIPEX-P) 37.5 MG tablet Take 37.5 mg by mouth daily before breakfast.   Yes Historical Provider, MD  potassium chloride (KLOR-CON 10) 10 MEQ tablet Take 1 tablet (10 mEq total) by mouth daily. 02/27/13  Yes Nelwyn SalisburyStephen A Fry, MD  promethazine (PHENERGAN) 25 MG tablet Take 1 tablet (25 mg total) by mouth every 6 (six) hours as needed for nausea. PATIENT TAKES FOR NAUSEA 10/12/13  Yes Nelwyn SalisburyStephen A Fry, MD  SUMAtriptan (IMITREX) 25 MG tablet Take 25 mg by mouth every 2 (two) hours as needed for migraine or headache. May repeat in 2 hours if headache persists or recurs.   Yes Historical Provider, MD    Physical Exam: Filed Vitals:   01/10/14 1633 01/10/14 1909 01/10/14 2241  BP: 141/111 168/102 122/94  Pulse: 116 115 108  Temp: 98.7 F (37.1 C) 98.9  F (37.2 C) 98 F (36.7 C)  TempSrc: Oral Oral Oral  Resp: 20 16 20   SpO2: 98% 96% 100%    Physical Exam  Constitutional: Appears well-developed and well-nourished. In mild distress due to pain.   HENT: Normocephalic. External right and left ear normal. Oropharynx is clear and moist.  Eyes: Conjunctivae and EOM are normal. PERRLA, no scleral icterus.  Neck: Normal ROM. Neck supple. No JVD. No tracheal deviation. No thyromegaly.  CVS: RRR, S1/S2 +, no murmurs, no gallops, no carotid bruit.  Pulmonary: Effort and breath sounds normal, no stridor, rhonchi, wheezes, rales.  Abdominal: Soft. BS +,  no distension, tenderness, rebound or guarding.  Musculoskeletal: paraspinal tenderness in lumbar area, straight leg raise test difficult to perform due to significant pain, pt unable to stand up and bear weight on exam. Lymphadenopathy: No lymphadenopathy noted, cervical, inguinal. Neuro: Alert. Normal reflexes, muscle tone coordination. No cranial nerve deficit. Skin: Skin is warm and dry. No rash noted. Not diaphoretic. No erythema. No pallor.  Psychiatric: Normal mood  and affect. Behavior, judgment, thought content normal.   Labs on Admission:  Basic Metabolic Panel:  Recent Labs Lab 01/11/14 0057  NA 138  K 4.2  CL 100  GLUCOSE 134*  BUN 22  CREATININE 0.90   CBC:  Recent Labs Lab 01/11/14 0048 01/11/14 0057  WBC 16.9*  --   NEUTROABS 15.4*  --   HGB 15.2* 15.0  HCT 43.8 44.0  MCV 90.1  --   PLT 351  --    EKG: Normal sinus rhythm, no ST/T wave changes  Debbora Presto, MD  [facility] Pager [phone]  If 7PM-7AM, please contact night-coverage [email or web address] Password [email or web address] 01/11/2014, 1:32 AM

## 2014-01-11 NOTE — Progress Notes (Signed)
[facility] PROGRESS NOTE  [name] ZOX:[id number] DOB: 07/07/1975 DOA: 01/10/2014 PCP: Nelwyn SalisburyFRY,[name], MD  Assessment/Plan: #1 low back pain/left lower extremity right radiculopathy/probable sciatica Patient with complaints of left lower extremity pain with pain radiating from the lower back down the left leg with numbness tingling and inability to ambulate. Patient does have a prior history of laminectomy at L3-L4. MRI of the L-spine done on 10/31/2013 showed progressive leftward disc protrusion at L4-L5 with mild to moderate lateral left recess stenosis. Concern for worsening disc disease. Will order MRI of the L-spine. Place on prednisone. Pain management. Continue Neurontin. Continue scheduled IV Toradol. Spoke with Dr. Thelma BargeKreitzer of neurosurgery who recommended pain management and close outpatient followup with Dr. Wynetta Emeryram of neurosurgery for further evaluation and management as outpatient.  #2 hypertension  stable. Continue HCTZ.  #3 leukocytosis Likely steroid induced. Patient stated recently just finished oral steroids. Urinalysis is negative. Chest x-ray is negative. Patient is currently afebrile. Monitor.  #4 gastroesophageal reflux disease PPI.  #5 history of fibromyalgia  #6 anxiety Anxiolytics as needed.  #7 prophylaxis PPI for GI prophylaxis. Lovenox for DVT prophylaxis.   Code Status: Full Family Communication: Updated patient at bedside. Disposition Plan: Remain inpatient.    Consultants:  None  Procedures:  CXR 01/11/14  Antibiotics:  None  HPI/Subjective: Patient c/o left hip pain, and pain shooting down left leg. Patient states unable to ambulate. Patient c/o LE numbness and tingling.  Objective: Filed Vitals:   01/11/14 1319  BP: 150/99  Pulse: 90  Temp: 98.4 F (36.9 C)  Resp: 20    Intake/Output Summary (Last 24 hours) at 01/11/14 1403 Last data filed at 01/11/14 1320  Gross per 24 hour  Intake    360 ml  Output      0 ml  Net     360 ml   Filed Weights   01/11/14 0234  Weight: 122.471 kg (270 lb)    Exam:   General:  NAD  Cardiovascular: RRR  Respiratory: CTAB  Abdomen: Soft/NT/ND/+BS  Musculoskeletal: No c/c/e   Data Reviewed: Basic Metabolic Panel:  Recent Labs Lab 01/11/14 0057 01/11/14 0430  NA 138 137  K 4.2 4.4  CL 100 96  CO2  --  29  GLUCOSE 134* 154*  BUN 22 23  CREATININE 0.90 0.84  CALCIUM  --  9.5   Liver Function Tests: No results found for this basename: AST, ALT, ALKPHOS, BILITOT, PROT, ALBUMIN,  in the last 168 hours No results found for this basename: LIPASE, AMYLASE,  in the last 168 hours No results found for this basename: AMMONIA,  in the last 168 hours CBC:  Recent Labs Lab 01/11/14 0048 01/11/14 0057 01/11/14 0430  WBC 16.9*  --  14.4*  NEUTROABS 15.4*  --   --   HGB 15.2* 15.0 15.1*  HCT 43.8 44.0 43.9  MCV 90.1  --  90.1  PLT 351  --  371   Cardiac Enzymes: No results found for this basename: CKTOTAL, CKMB, CKMBINDEX, TROPONINI,  in the last 168 hours BNP (last 3 results) No results found for this basename: PROBNP,  in the last 8760 hours CBG: No results found for this basename: GLUCAP,  in the last 168 hours  No results found for this or any previous visit (from the past 240 hour(s)).   Studies: Dg Chest Port 1 View  01/11/2014   CLINICAL DATA:  Shortness of breath  EXAM: PORTABLE CHEST - 1 VIEW  COMPARISON:  09/20/2006  FINDINGS: Cardiac shadow is within normal limits. The lungs are incompletely expanded with some mild basilar atelectasis particularly on the left. No focal confluent infiltrate is seen. No sizable effusion is noted. No bony abnormality is seen.  IMPRESSION: Mild left basilar atelectasis which may be related to poor inspiratory effort.   Electronically Signed   By: Alcide Clever M.D.   On: 01/11/2014 09:33    Scheduled Meds: . enoxaparin (LOVENOX) injection  60 mg Subcutaneous Daily  . fluticasone  2 spray Each Nare Daily  .  gabapentin  300 mg Oral TID  . hydrochlorothiazide  25 mg Oral Daily  . ketorolac  30 mg Intravenous 4 times per day  . nortriptyline  50 mg Oral QHS  . pantoprazole  40 mg Oral BID  . potassium chloride  10 mEq Oral Daily   Continuous Infusions:   Principal Problem:   Sciatica Active Problems:   Back pain   Morbid obesity   ANXIETY DISORDER, GENERALIZED   GERD   FIBROMYALGIA   HTN (hypertension)   Leukocytosis, unspecified   Intervertebral disc protrusion    Time spent: 35 mins    [facility] MD [facility] Pager [phone]. If 7PM-7AM, please contact night-coverage at [email or web address], password [facility] 01/11/2014, 2:03 PM  LOS: 1 day

## 2014-01-11 NOTE — Progress Notes (Signed)
Rx Brief Lovenox note Wt=122 kg BMI=38.7 Adjusted Lovenox to 60mg  (~0.5mg /kg) in pt with BMI>30 Tx Lorenza EvangelistGreen, Joeseph Verville R 01/11/2014 2:49 AM

## 2014-01-11 NOTE — Telephone Encounter (Signed)
Called to the pharmacy and left on voicemail. 

## 2014-01-12 ENCOUNTER — Inpatient Hospital Stay (HOSPITAL_COMMUNITY): Payer: BC Managed Care – PPO

## 2014-01-12 DIAGNOSIS — M545 Low back pain, unspecified: Secondary | ICD-10-CM

## 2014-01-12 LAB — CBC
HCT: 39.3 % (ref 36.0–46.0)
Hemoglobin: 13.2 g/dL (ref 12.0–15.0)
MCH: 31.2 pg (ref 26.0–34.0)
MCHC: 33.6 g/dL (ref 30.0–36.0)
MCV: 92.9 fL (ref 78.0–100.0)
Platelets: 321 10*3/uL (ref 150–400)
RBC: 4.23 MIL/uL (ref 3.87–5.11)
RDW: 13.8 % (ref 11.5–15.5)
WBC: 16 10*3/uL — ABNORMAL HIGH (ref 4.0–10.5)

## 2014-01-12 LAB — BASIC METABOLIC PANEL
Anion gap: 11 (ref 5–15)
BUN: 28 mg/dL — ABNORMAL HIGH (ref 6–23)
CO2: 30 mEq/L (ref 19–32)
Calcium: 9.2 mg/dL (ref 8.4–10.5)
Chloride: 98 mEq/L (ref 96–112)
Creatinine, Ser: 1.12 mg/dL — ABNORMAL HIGH (ref 0.50–1.10)
GFR calc Af Amer: 71 mL/min — ABNORMAL LOW (ref >90)
GFR calc non Af Amer: 61 mL/min — ABNORMAL LOW (ref >90)
Glucose, Bld: 97 mg/dL (ref 70–99)
Potassium: 3.9 mEq/L (ref 3.7–5.3)
Sodium: 139 mEq/L (ref 137–147)

## 2014-01-12 MED ORDER — HYDROMORPHONE HCL PF 2 MG/ML IJ SOLN
2.0000 mg | Freq: Once | INTRAMUSCULAR | Status: AC
  Administered 2014-01-12: 2 mg via INTRAVENOUS

## 2014-01-12 MED ORDER — SODIUM CHLORIDE 0.9 % IV SOLN
INTRAVENOUS | Status: DC
  Administered 2014-01-12 – 2014-01-13 (×2): via INTRAVENOUS

## 2014-01-12 MED ORDER — AMLODIPINE BESYLATE 5 MG PO TABS
5.0000 mg | ORAL_TABLET | Freq: Every day | ORAL | Status: DC
  Filled 2014-01-12: qty 1

## 2014-01-12 MED ORDER — SODIUM CHLORIDE 0.9 % IV SOLN
INTRAVENOUS | Status: DC
  Administered 2014-01-12: 08:00:00 via INTRAVENOUS
  Filled 2014-01-12 (×2): qty 1000

## 2014-01-12 MED ORDER — DOCUSATE SODIUM 100 MG PO CAPS
100.0000 mg | ORAL_CAPSULE | Freq: Two times a day (BID) | ORAL | Status: DC
  Administered 2014-01-12 – 2014-01-13 (×3): 100 mg via ORAL

## 2014-01-12 MED ORDER — LIDOCAINE 5 % EX PTCH
1.0000 | MEDICATED_PATCH | Freq: Every day | CUTANEOUS | Status: DC
  Administered 2014-01-12 – 2014-01-13 (×2): 1 via TRANSDERMAL
  Filled 2014-01-12 (×3): qty 1

## 2014-01-12 NOTE — ED Provider Notes (Signed)
Medical screening examination/treatment/procedure(s) were performed by non-physician practitioner and as supervising physician I was immediately available for consultation/collaboration.  Toy CookeyMegan Giorgia Wahler, MD 01/12/14 2010

## 2014-01-12 NOTE — Progress Notes (Signed)
OT Cancellation Note  Patient Details Name: Osborne OmanMyranda Burack MRN: [id number] DOB: 07/27/1975   Cancelled Treatment:    Reason Eval/Treat Not Completed: Other (comment) Note plan for MRI of L spine. Will await results prior to OT eval.  Lennox LaityStone, Alvah Lagrow Stafford [phone] 01/12/2014, 12:04 PM

## 2014-01-12 NOTE — Progress Notes (Signed)
[facility] PROGRESS NOTE  Osborne OmanMyranda Jablonowski ZOX:[id number] DOB: 06/10/1975 DOA: 01/10/2014 PCP: Nelwyn SalisburyFRY,[name], MD  Assessment/Plan: #1 low back pain/left lower extremity right radiculopathy/probable sciatica Patient with complaints of left lower extremity pain with pain radiating from the lower back down the left leg with numbness tingling and inability to ambulate. Patient does have a prior history of laminectomy at L3-L4. MRI of the L-spine done on 10/31/2013 showed progressive leftward disc protrusion at L4-L5 with mild to moderate lateral left recess stenosis. Concern for worsening disc disease. Unable to tolerate MRI of the L-spine yesterday. Unable to get MRI under conscious sedation to be scheduled. Continue prednisone. Pain management. Continue Neurontin. Continue scheduled IV Toradol. Spoke with Dr. Thelma BargeKreitzer of neurosurgery who recommended pain management and close outpatient followup with Dr. Wynetta Emeryram of neurosurgery for further evaluation and management as outpatient.  #2 hypertension  stable. D/C HCTZ secondary to elevated creatinine.  #3 leukocytosis Likely steroid induced. Patient stated recently just finished oral steroids. Urinalysis is negative. Chest x-ray is negative. Patient has been started back on steriods for #1.  Monitor.  #4 gastroesophageal reflux disease PPI.  #5 history of fibromyalgia  #6 anxiety Anxiolytics as needed.  #7 prophylaxis PPI for GI prophylaxis. Lovenox for DVT prophylaxis.   Code Status: Full Family Communication: Updated patient at bedside. Disposition Plan: Remain inpatient.    Consultants:  None  Procedures:  CXR 01/11/14  Antibiotics:  None  HPI/Subjective: Patient c/o left hip pain, and pain shooting down left leg. Patient states some improvement with leg pain after being started on lidoderm patch.  Patient c/o LE numbness and tingling.  Objective: Filed Vitals:   01/12/14 0501  BP: 117/77  Pulse: 84  Temp: 98 F (36.7  C)  Resp: 16    Intake/Output Summary (Last 24 hours) at 01/12/14 0835 Last data filed at 01/11/14 2359  Gross per 24 hour  Intake    840 ml  Output      0 ml  Net    840 ml   Filed Weights   01/11/14 0234  Weight: 122.471 kg (270 lb)    Exam:   General:  NAD  Cardiovascular: RRR  Respiratory: CTAB  Abdomen: Soft/NT/ND/+BS  Musculoskeletal: No c/c/e   Data Reviewed: Basic Metabolic Panel:  Recent Labs Lab 01/11/14 0057 01/11/14 0430 01/12/14 0425  NA 138 137 139  K 4.2 4.4 3.9  CL 100 96 98  CO2  --  29 30  GLUCOSE 134* 154* 97  BUN 22 23 28*  CREATININE 0.90 0.84 1.12*  CALCIUM  --  9.5 9.2   Liver Function Tests: No results found for this basename: AST, ALT, ALKPHOS, BILITOT, PROT, ALBUMIN,  in the last 168 hours No results found for this basename: LIPASE, AMYLASE,  in the last 168 hours No results found for this basename: AMMONIA,  in the last 168 hours CBC:  Recent Labs Lab 01/11/14 0048 01/11/14 0057 01/11/14 0430 01/12/14 0425  WBC 16.9*  --  14.4* 16.0*  NEUTROABS 15.4*  --   --   --   HGB 15.2* 15.0 15.1* 13.2  HCT 43.8 44.0 43.9 39.3  MCV 90.1  --  90.1 92.9  PLT 351  --  371 321   Cardiac Enzymes: No results found for this basename: CKTOTAL, CKMB, CKMBINDEX, TROPONINI,  in the last 168 hours BNP (last 3 results) No results found for this basename: PROBNP,  in the last 8760 hours CBG: No results found for this basename: GLUCAP,  in the last 168 hours  No results found for this or any previous visit (from the past 240 hour(s)).   Studies: Dg Chest Port 1 View  01/11/2014   CLINICAL DATA:  Shortness of breath  EXAM: PORTABLE CHEST - 1 VIEW  COMPARISON:  09/20/2006  FINDINGS: Cardiac shadow is within normal limits. The lungs are incompletely expanded with some mild basilar atelectasis particularly on the left. No focal confluent infiltrate is seen. No sizable effusion is noted. No bony abnormality is seen.  IMPRESSION: Mild left  basilar atelectasis which may be related to poor inspiratory effort.   Electronically Signed   By: Alcide Clever M.D.   On: 01/11/2014 09:33    Scheduled Meds: . amLODipine  5 mg Oral Daily  . enoxaparin (LOVENOX) injection  60 mg Subcutaneous Daily  . fluticasone  2 spray Each Nare Daily  . gabapentin  300 mg Oral TID  . ketorolac  30 mg Intravenous 4 times per day  . lidocaine  1 patch Transdermal Daily  . nortriptyline  50 mg Oral QHS  . pantoprazole  40 mg Oral BID  . potassium chloride  10 mEq Oral Daily  . predniSONE  40 mg Oral QAC breakfast   Continuous Infusions: . sodium chloride 0.9 % 1,000 mL infusion 100 mL/hr at 01/12/14 0825    Principal Problem:   Sciatica Active Problems:   Back pain   Morbid obesity   ANXIETY DISORDER, GENERALIZED   GERD   FIBROMYALGIA   HTN (hypertension)   Leukocytosis, unspecified   Intervertebral disc protrusion    Time spent: 35 mins    [facility] MD [facility] Pager [phone]. If 7PM-7AM, please contact night-coverage at [email or web address], password [facility] 01/12/2014, 8:35 AM  LOS: 2 days

## 2014-01-12 NOTE — Progress Notes (Signed)
PT Cancellation Note  Patient Details Name: [name] MRN: [id number] DOB: 06/08/1975   Cancelled Treatment:    Reason Eval/Treat Not Completed: Medical issues which prohibited therapy (awaiting MRI)   Rada HayHill, Mariene Dickerman Elizabeth 01/12/2014, 1:26 PM Blanchard KelchKaren Johnryan Sao PT [phone]

## 2014-01-12 NOTE — Evaluation (Signed)
Physical Therapy Evaluation Patient Details Name: [name] MRN: [id number] DOB: 06/05/1975 Today's Date: 01/12/2014   History of Present Illness  back pain/left lower extremity right radiculopathy/probable sciatica, uncontrolled .Awaiting  sedated MRI.  Clinical Impression  Pt has severely impaired gait, pain after maximum medication per RN. Pt will benefit from PT to address problems listed in chart.    Follow Up Recommendations No PT follow up (until a diagnosis is made.)    Equipment Recommendations  Rolling walker with 5" wheels;3in1 (PT) (pt to decide prior to DC.)    Recommendations for Other Services       Precautions / Restrictions Precautions Precautions: Fall      Mobility  Bed Mobility Overal bed mobility: Needs Assistance Bed Mobility: Supine to Sit;Sit to Supine     Supine to sit: Mod assist Sit to supine: Mod assist   General bed mobility comments: spouse assisting with bed mobility, HOB raised , use of rail.  Transfers Overall transfer level: Needs assistance Equipment used: Rolling walker (2 wheeled) Transfers: Sit to/from Stand Sit to Stand: Max assist;From elevated surface         General transfer comment: pt holds onto spouses arm  Ambulation/Gait Ambulation/Gait assistance: +2 physical assistance;+2 safety/equipment;Max assist;Mod assist Ambulation Distance (Feet): 50 Feet (then 20') Assistive device: Rolling walker (2 wheeled) Gait Pattern/deviations: Step-to pattern;Decreased step length - left;Decreased stance time - left;Trunk flexed;Shuffle     General Gait Details: pt stops frequently and bends forward and raises L leg from floor with c/o increased burning of shin. Pt had to sit and rest then continued to walk, very bent forward,   Stairs            Wheelchair Mobility    Modified Rankin (Stroke Patients Only)       Balance                                             Pertinent Vitals/Pain Pain  Assessment: 0-10 Pain Score: 10-Worst pain ever Pain Location: back, L shin, L hip Pain Descriptors / Indicators: Aching;Burning;Cramping;Moaning;Crying;Numbness;Pressure;Radiating;Sharp;Shooting;Squeezing;Stabbing Pain Intervention(s): Limited activity within patient's tolerance;Monitored during session;Premedicated before session;Heat applied    Home Living Family/patient expects to be discharged to:: Private residence Living Arrangements: Spouse/significant other Available Help at Discharge: Family;Available 24 hours/day Type of Home: House Home Access: Stairs to enter   Entergy CorporationEntrance Stairs-Number of Steps: 1 Home Layout: One level Home Equipment: None      Prior Function Level of Independence: Needs assistance   Gait / Transfers Assistance Needed: has recently needed  assist for mobility, holds onto walls and furniture.           Hand Dominance        Extremity/Trunk Assessment   Upper Extremity Assessment: Defer to OT evaluation           Lower Extremity Assessment: LLE deficits/detail   LLE Deficits / Details: pt drags L leg during mobilituy, pt is unable to get L leg  to midline, legs tend to migrate  into abduction., Pain increases in back with attempts to adduct L leg.  Cervical / Trunk Assessment: Other exceptions  Communication      Cognition Arousal/Alertness: Awake/alert Behavior During Therapy: WFL for tasks assessed/performed Overall Cognitive Status: Within Functional Limits for tasks assessed  General Comments      Exercises        Assessment/Plan    PT Assessment Patient needs continued PT services  PT Diagnosis Difficulty walking;Acute pain   PT Problem List Decreased range of motion;Decreased activity tolerance;Decreased mobility;Decreased knowledge of use of DME;Decreased safety awareness;Cardiopulmonary status limiting activity;Decreased knowledge of precautions;Impaired sensation;Pain;Obesity  PT  Treatment Interventions DME instruction;Gait training;Functional mobility training;Therapeutic activities;Patient/family education   PT Goals (Current goals can be found in the Care Plan section) Acute Rehab PT Goals Patient Stated Goal: To be free of pain,, find out what is wrong. PT Goal Formulation: With patient/family Time For Goal Achievement: 01/19/14 Potential to Achieve Goals: Fair    Frequency Min 5X/week   Barriers to discharge        Co-evaluation               End of Session   Activity Tolerance: Patient limited by pain Patient left: in bed;with call bell/phone within reach;with family/visitor present Nurse Communication: Mobility status         Time: 7829-5621 PT Time Calculation (min): 25 min   Charges:   PT Evaluation $Initial PT Evaluation Tier I: 1 Procedure PT Treatments $Gait Training: 23-37 mins   PT G Codes:          Rada Hay 01/12/2014, 5:50 PM

## 2014-01-12 NOTE — Progress Notes (Signed)
Episode of severe anterior lower leg pain occurred after activity; self-rated #10( scale 0-10). Dilaudid IV given once  with minimal relief. Pain management continued as patient requested.

## 2014-01-12 NOTE — Progress Notes (Signed)
CARE MANAGEMENT NOTE 01/12/2014  Patient:  [name],[name]   Account Number:  [id number]  Date Initiated:  01/12/2014  Documentation initiated by:  Taron Mondor  Subjective/Objective Assessment:   admitted as observation and upgraded to inpatient on [id number] due to increased pain and inability to bear wt on legs/iv Toradol infusion     Action/Plan:   home when stable /neurosurgery consult pending   Anticipated DC Date:  01/15/2014   Anticipated DC Plan:  HOME/SELF CARE  In-house referral  NA      DC Planning Services  CM consult      PAC Choice  NA   Choice offered to / List presented to:  NA      DME agency  NA     HH arranged  NA      HH agency  NA   Status of service:  In process, will continue to follow Medicare Important Message given?  NA - LOS <3 / Initial given by admissions (If response is "NO", the following Medicare IM given date fields will be blank) Date Medicare IM given:   Medicare IM given by:   Date Additional Medicare IM given:   Additional Medicare IM given by:    Discharge Disposition:    Per UR Regulation:  Reviewed for med. necessity/level of care/duration of stay  If discussed at Long Length of Stay Meetings, dates discussed:    Comments:  Bjorn LoserRhonda Lameka Disla,RN,BSN,CCM

## 2014-01-13 LAB — BASIC METABOLIC PANEL
Anion gap: 11 (ref 5–15)
BUN: 28 mg/dL — ABNORMAL HIGH (ref 6–23)
CO2: 25 mEq/L (ref 19–32)
Calcium: 8.9 mg/dL (ref 8.4–10.5)
Chloride: 103 mEq/L (ref 96–112)
Creatinine, Ser: 0.91 mg/dL (ref 0.50–1.10)
GFR calc Af Amer: >90 mL/min (ref >90)
GFR calc non Af Amer: 78 mL/min — ABNORMAL LOW (ref >90)
Glucose, Bld: 89 mg/dL (ref 70–99)
Potassium: 4.6 mEq/L (ref 3.7–5.3)
Sodium: 139 mEq/L (ref 137–147)

## 2014-01-13 MED ORDER — HYDROMORPHONE HCL 2 MG PO TABS: 1.0000 mg | ORAL_TABLET | ORAL | Status: DC | PRN

## 2014-01-13 MED ORDER — LIDOCAINE 5 % EX PTCH: 1.0000 | MEDICATED_PATCH | Freq: Every day | CUTANEOUS | Status: DC

## 2014-01-13 MED ORDER — PREDNISONE 10 MG PO TABS: 10.0000 mg | ORAL_TABLET | Freq: Every day | ORAL | Status: AC

## 2014-01-13 MED ORDER — PREDNISONE 20 MG PO TABS: 40.0000 mg | ORAL_TABLET | Freq: Every day | ORAL | Status: DC

## 2014-01-13 MED ORDER — DSS 100 MG PO CAPS
100.0000 mg | ORAL_CAPSULE | Freq: Two times a day (BID) | ORAL | Status: DC
Start: 2014-01-13 — End: 2015-01-07

## 2014-01-13 NOTE — Progress Notes (Signed)
CARE MANAGEMENT NOTE 01/13/2014  Patient:  [name],[name]   Account Number:  [id number]  Date Initiated:  01/12/2014  Documentation initiated by:  DAVIS,RHONDA  Subjective/Objective Assessment:   admitted as observation and upgraded to inpatient on [id number] due to increased pain and inability to bear wt on legs/iv Toradol infusion     Action/Plan:   home when stable /neurosurgery consult pending   Anticipated DC Date:  01/15/2014   Anticipated DC Plan:  HOME/SELF CARE  In-house referral  NA      DC Planning Services  CM consult      PAC Choice  NA   Choice offered to / List presented to:  NA   DME arranged  3-N-1  WALKER - ROLLING      DME agency  [facility]     HH arranged  NA      HH agency  NA   Status of service:  Completed, signed off Medicare Important Message given?  NA - LOS <3 / Initial given by admissions (If response is "NO", the following Medicare IM given date fields will be blank) Date Medicare IM given:   Medicare IM given by:   Date Additional Medicare IM given:   Additional Medicare IM given by:    Discharge Disposition:  HOME/SELF CARE  Per UR Regulation:  Reviewed for med. necessity/level of care/duration of stay  If discussed at Long Length of Stay Meetings, dates discussed:    Comments:  01/13/2014 1500  Notified [facility] for RW and 3n1 for home. Dominiq Fontaine RN CCM Case Mgmt phone [phone]  Rhonda Davis,RN,BSN,CCM   

## 2014-01-13 NOTE — Progress Notes (Signed)
Physical Therapy Treatment Patient Details Name: Ekaterina Denise MRN: [id number] DOB: [birth year] Today's Date: 01/13/2014    History of Present Illness back pain/left lower extremity right radiculopathy/probable sciatica, uncontrolled.  Pt reports being unable to tolerate positioning for MRI a couple days ago and awaiting sedated MRI which may happen as outpatient?    PT Comments    Pt assisted to bathroom and then ambulated in hallway.  Pt appears to be mobilizing better today then yesterday however still reports increased pain.  Discussed back precautions to possibly assist with pain control and pillow positioning as pt is currently unable to sit/lay on L buttock due to intense stabbing pain.   Follow Up Recommendations  No PT follow up (pending plan of care)     Equipment Recommendations  Rolling walker with 5" wheels    Recommendations for Other Services       Precautions / Restrictions Precautions Precautions: Fall;Back Precaution Comments: discussed back precautions since pt has had previous back surgery and may assist with pain control    Mobility  Bed Mobility Overal bed mobility: Needs Assistance Bed Mobility: Supine to Sit     Supine to sit: Supervision;HOB elevated        Transfers Overall transfer level: Needs assistance Equipment used: Rolling walker (2 wheeled) Transfers: Sit to/from Stand Sit to Stand: Min guard         General transfer comment: verbal cues for positioning  Ambulation/Gait Ambulation/Gait assistance: Min assist;+2 safety/equipment Ambulation Distance (Feet): 80 Feet Assistive device: Rolling walker (2 wheeled) Gait Pattern/deviations: Step-to pattern;Decreased stance time - left;Trunk flexed;Wide base of support Gait velocity: decr   General Gait Details: a few rest breaks due to burning pain down L LE, tends to rest with forward flexion as pt reports increasing into trunk extension creates more pain, pt assisted to bathroom to  void then into hallway   Stairs            Wheelchair Mobility    Modified Rankin (Stroke Patients Only)       Balance                                    Cognition Arousal/Alertness: Awake/alert Behavior During Therapy: WFL for tasks assessed/performed Overall Cognitive Status: Within Functional Limits for tasks assessed                      Exercises      General Comments        Pertinent Vitals/Pain Pain Assessment: 0-10 Pain Score: 9  Pain Location: back, L buttock, radiating down L LE Pain Descriptors / Indicators: Burning;Grimacing;Sharp;Shooting;Tingling;Stabbing Pain Intervention(s): Limited activity within patient's tolerance;Repositioned;RN gave pain meds during session    Home Living                      Prior Function            PT Goals (current goals can now be found in the care plan section) Progress towards PT goals: Progressing toward goals    Frequency  Min 5X/week    PT Plan Current plan remains appropriate    Co-evaluation             End of Session   Activity Tolerance: Patient limited by pain Patient left: with call bell/phone within reach;in chair;with family/visitor present     Time: 1137-1204 PT Time Calculation (min): 27 min  Charges:  $Gait Training: 23-37 mins                    G Codes:      Charae Depaolis,KATHrine E 01/13/2014, 1:56 PM Zenovia JarredKati Zurich Carreno, PT, DPT 01/13/2014 Pager: [phone]

## 2014-01-13 NOTE — Progress Notes (Signed)
Discharged from floor via w/c, spouse with pt. No changes in assessment. [name]   

## 2014-01-13 NOTE — Progress Notes (Signed)
OT Cancellation Note  Patient Details Name: [name] MRN: [id number] DOB: 09/24/1975   Cancelled Treatment:    Reason Eval/Treat Not Completed: Other (comment) (await MRI results)  Taiwo Fish A 01/13/2014, 8:28 AM

## 2014-01-13 NOTE — Discharge Summary (Signed)
Physician Discharge Summary  [name] ZOX:[id number] DOB: May 28, 1976 DOA: 01/10/2014  PCP: Hannah Salisbury, MD  Admit date: 01/10/2014 Discharge date: 01/13/2014  Time spent: 65 minutes  Recommendations for Outpatient Follow-up:  1. Followup with Dr. Wynetta [name] of neurosurgery as scheduled on Tuesday, 01/16/2014. Patient will need an MRI of the L-spine done to rule out disc disease. 2. Followup with FRY,[name], MD in 1 week. On follow up patient will need a basic metabolic profile done to followup on electrolytes and renal function.  Discharge Diagnoses:  Principal Problem:   Sciatica Active Problems:   Back pain   Morbid obesity   ANXIETY DISORDER, GENERALIZED   GERD   FIBROMYALGIA   HTN (hypertension)   Leukocytosis, unspecified   Intervertebral disc protrusion   Discharge Condition: STABLE  Diet recommendation: regular  Filed Weights   01/11/14 0234  Weight: 122.471 kg (270 lb)    History of present illness:  38 year old female with history of fibromyalgia, herniated disc at the level of L4-5 presented to [facility] ED with persistent low back pain, radiating to the left buttock area and lateral aspect of the left lower extremity, sharp and shooting, 10/10 in severity and worse with ambulation, associated with "pins and needles" sensation, no specific alleviating factors. Pt reported having been diagnosed with sciatica in the past but she has never had an episode as bad as this one. No fevers, chills, no drug use.  In ED, pt noted to be in pain and difficult to control on oral meds. TRH asked to admit for pain control      Hospital Course:  #1 low back pain/left lower extremity right radiculopathy/probable sciatica  Patient with complaints of left lower extremity pain with pain radiating from the lower back down the left leg with numbness tingling and inability to ambulate. Patient does have a prior history of laminectomy at L3-L4. MRI of the L-spine done on 10/31/2013 showed  progressive leftward disc protrusion at L4-L5 with mild to moderate lateral left recess stenosis. Concern for worsening disc disease. Unable to tolerate MRI of the L-spine. Unable to get MRI under conscious sedation to be scheduled. Patient was started on prednisone and pain management. Patient was continued on her home regimen of Neurontin. Spoke with Dr. Thelma Barge of neurosurgery who recommended pain management and close outpatient followup with Dr. Wynetta [name] of neurosurgery for further evaluation and management as outpatient. Appointment has been made for patient to follow up with Dr. Wynetta [name] of neurosurgery on Tuesday, 01/16/2014.  #2 hypertension  stable. D/C'd HCTZ secondary to elevated creatinine. Patient placed on IVF. Bp remained stable. #3 leukocytosis  Likely steroid induced. Patient stated recently just finished oral steroids. Urinalysis is negative. Chest x-ray is negative. Patient has been started back on steriods for #1.  #4 gastroesophageal reflux disease  Patient was maintained on PPI.  #5 history of fibromyalgia  #6 anxiety  Anxiolytics as needed.   Procedures: CXR 01/11/14   Consultations:  None  Discharge Exam: Filed Vitals:   01/13/14 0525  BP: 135/92  Pulse: 99  Temp: 98.5 F (36.9 C)  Resp: 16    General: NAD Cardiovascular: RRR Respiratory: CTAB  Discharge Instructions You were cared for by a hospitalist during your hospital stay. If you have any questions about your discharge medications or the care you received while you were in the hospital after you are discharged, you can call the unit and asked to speak with the hospitalist on call if the hospitalist that took care of you is  not available. Once you are discharged, your primary care physician will handle any further medical issues. Please note that NO REFILLS for any discharge medications will be authorized once you are discharged, as it is imperative that you return to your primary care physician (or establish a  relationship with a primary care physician if you do not have one) for your aftercare needs so that they can reassess your need for medications and monitor your lab values.  Discharge Instructions   Diet general    Complete by:  As directed      Discharge instructions    Complete by:  As directed   Follow up with Dr [name] as scheduled on Tuesday 01/16/14. Follow up with FRY,[name], MD in 1-2 weeks.     Increase activity slowly    Complete by:  As directed             Medication List         ALPRAZolam 0.5 MG tablet  Commonly known as:  XANAX  Take 1 tablet (0.5 mg total) by mouth 3 (three) times daily as needed.     celecoxib 200 MG capsule  Commonly known as:  CELEBREX  Take 1 capsule (200 mg total) by mouth 2 (two) times daily.     cyanocobalamin 1000 MCG/ML injection  Commonly known as:  (VITAMIN B-12)  Inject 1 mL (1,000 mcg total) into the muscle every 7 (seven) days.     cyclobenzaprine 10 MG tablet  Commonly known as:  FLEXERIL  Take 10 mg by mouth every 8 (eight) hours as needed for muscle spasms. PATIENT TAKES FOR MUSCLE SPASMS     diazepam 5 MG tablet  Commonly known as:  VALIUM  Take 1 tablet (5 mg total) by mouth every 8 (eight) hours as needed for muscle spasms.     DSS 100 MG Caps  Take 100 mg by mouth 2 (two) times daily.     fluticasone 50 MCG/ACT nasal spray  Commonly known as:  FLONASE  Place 2 sprays into both nostrils daily.     gabapentin 300 MG capsule  Commonly known as:  NEURONTIN  Take 1 capsule (300 mg total) by mouth 3 (three) times daily.     hydrochlorothiazide 25 MG tablet  Commonly known as:  HYDRODIURIL  Take 25 mg by mouth daily.     HYDROmorphone 2 MG tablet  Commonly known as:  DILAUDID  Take 0.5-1 tablets (1-2 mg total) by mouth every 4 (four) hours as needed for moderate pain or severe pain.     lidocaine 5 %  Commonly known as:  LIDODERM  Place 1 patch onto the skin daily. Remove & Discard patch within 12 hours or as  directed by MD     MULTI-VITAMIN GUMMIES PO  Take 2 tablets by mouth daily.     nortriptyline 50 MG capsule  Commonly known as:  PAMELOR  Take 1 capsule (50 mg total) by mouth at bedtime.     oxyCODONE-acetaminophen 10-325 MG per tablet  Commonly known as:  PERCOCET  Take 1 tablet by mouth every 6 (six) hours as needed for pain.     pantoprazole 40 MG tablet  Commonly known as:  PROTONIX  Take 1 tablet (40 mg total) by mouth 2 (two) times daily.     phentermine 37.5 MG tablet  Commonly known as:  ADIPEX-P  Take 37.5 mg by mouth daily before breakfast.     potassium chloride 10 MEQ tablet  Commonly  known as:  KLOR-CON 10  Take 1 tablet (10 mEq total) by mouth daily.     predniSONE 20 MG tablet  Commonly known as:  DELTASONE  Take 2 tablets (40 mg total) by mouth daily before breakfast. Take for 5 days, then stop     promethazine 25 MG tablet  Commonly known as:  PHENERGAN  Take 1 tablet (25 mg total) by mouth every 6 (six) hours as needed for nausea. PATIENT TAKES FOR NAUSEA     SUMAtriptan 25 MG tablet  Commonly known as:  IMITREX  Take 25 mg by mouth every 2 (two) hours as needed for migraine or headache. May repeat in 2 hours if headache persists or recurs.     Vitamin D3 2000 UNITS Tabs  Take 1 tablet by mouth daily.       Allergies  Allergen Reactions  . Gadolinium Derivatives Swelling and Other (See Comments)    Arm swelling, tingling, & redness. Radiologist Dr. Grace IsaacWatts recommends an injection of steroids if patient needs a gadolinium based contrast in the future.       Follow-up Information   Follow up with FRY,[name], MD. Schedule an appointment as soon as possible for a visit in 1 week. (f/u in 1- 2 weeks)    Specialty:  Family Medicine   Contact information:   [address] [place] [address] [phone]       Follow up with Mariam DollarRAM,GARY P, MD On 01/16/2014.   Specialty:  Neurosurgery   Contact information:   [address] [place] [address] [phone]        The results of significant diagnostics from this hospitalization (including imaging, microbiology, ancillary and laboratory) are listed below for reference.    Significant Diagnostic Studies: Dg Chest Port 1 View  01/11/2014   CLINICAL DATA:  Shortness of breath  EXAM: PORTABLE CHEST - 1 VIEW  COMPARISON:  09/20/2006  FINDINGS: Cardiac shadow is within normal limits. The lungs are incompletely expanded with some mild basilar atelectasis particularly on the left. No focal confluent infiltrate is seen. No sizable effusion is noted. No bony abnormality is seen.  IMPRESSION: Mild left basilar atelectasis which may be related to poor inspiratory effort.   Electronically Signed   By: Alcide CleverMark  Lukens M.D.   On: 01/11/2014 09:33    Microbiology: No results found for this or any previous visit (from the past 240 hour(s)).   Labs: Basic Metabolic Panel:  Recent Labs Lab 01/11/14 0057 01/11/14 0430 01/12/14 0425 01/13/14 0530  NA 138 137 139 139  K 4.2 4.4 3.9 4.6  CL 100 96 98 103  CO2  --  29 30 25   GLUCOSE 134* 154* 97 89  BUN 22 23 28* 28*  CREATININE 0.90 0.84 1.12* 0.91  CALCIUM  --  9.5 9.2 8.9   Liver Function Tests: No results found for this basename: AST, ALT, ALKPHOS, BILITOT, PROT, ALBUMIN,  in the last 168 hours No results found for this basename: LIPASE, AMYLASE,  in the last 168 hours No results found for this basename: AMMONIA,  in the last 168 hours CBC:  Recent Labs Lab 01/11/14 0048 01/11/14 0057 01/11/14 0430 01/12/14 0425  WBC 16.9*  --  14.4* 16.0*  NEUTROABS 15.4*  --   --   --   HGB 15.2* 15.0 15.1* 13.2  HCT 43.8 44.0 43.9 39.3  MCV 90.1  --  90.1 92.9  PLT 351  --  371 321  Cardiac Enzymes: No results found for this basename: CKTOTAL, CKMB, CKMBINDEX, TROPONINI,  in the last 168 hours BNP: BNP (last 3 results) No results found for this basename: PROBNP,  in the last 8760 hours CBG: No results found  for this basename: GLUCAP,  in the last 168 hours     Signed:  [facility] MD [facility] 01/13/2014, 1:37 PM

## 2014-01-19 ENCOUNTER — Ambulatory Visit: Payer: BC Managed Care – PPO | Admitting: Neurology

## 2014-01-19 ENCOUNTER — Telehealth: Payer: Self-pay | Admitting: Neurology

## 2014-01-19 NOTE — Telephone Encounter (Signed)
Pt resch appt from today to until 02-14-14

## 2014-01-31 HISTORY — PX: LUMBAR DISC SURGERY: SHX700

## 2014-02-09 ENCOUNTER — Other Ambulatory Visit: Payer: Self-pay | Admitting: Family Medicine

## 2014-02-14 ENCOUNTER — Ambulatory Visit: Payer: BC Managed Care – PPO | Admitting: Neurology

## 2014-02-21 ENCOUNTER — Encounter: Payer: Self-pay | Admitting: Family Medicine

## 2014-02-21 ENCOUNTER — Ambulatory Visit (INDEPENDENT_AMBULATORY_CARE_PROVIDER_SITE_OTHER): Payer: BC Managed Care – PPO | Admitting: Family Medicine

## 2014-02-21 VITALS — BP 132/94 | HR 123 | Temp 99.0°F | Ht 70.0 in | Wt 289.0 lb

## 2014-02-21 DIAGNOSIS — I10 Essential (primary) hypertension: Secondary | ICD-10-CM

## 2014-02-21 DIAGNOSIS — F411 Generalized anxiety disorder: Secondary | ICD-10-CM

## 2014-02-21 DIAGNOSIS — R51 Headache: Secondary | ICD-10-CM

## 2014-02-21 MED ORDER — METOPROLOL SUCCINATE ER 50 MG PO TB24: 50.0000 mg | ORAL_TABLET | Freq: Every day | ORAL | Status: DC

## 2014-02-21 MED ORDER — ALPRAZOLAM 0.5 MG PO TABS: 0.5000 mg | ORAL_TABLET | Freq: Three times a day (TID) | ORAL | Status: DC | PRN

## 2014-02-21 NOTE — Progress Notes (Signed)
Pre visit review using our clinic review tool, if applicable. No additional management support is needed unless otherwise documented below in the visit note. 

## 2014-02-21 NOTE — Progress Notes (Signed)
   Subjective:    Patient ID: [name], female    DOB: Oct 12, 1975, 38 y.o.   MRN: [id number]  HPI Here for anxiety, headaches, weight gain, and HTN. She has had a rough several months with low back pain and she ended up having surgery per Dr. [name] on 01-31-14. Prior to that she took a lot of prednisone and gained 40 lbs of weight. She has had headaches and feels very stressed, and she asks for refills on Xanax. Her BP at home has been running in the 160s over 100s. She is still on Maxzide.    Review of Systems  Constitutional: Negative.   Respiratory: Negative.   Cardiovascular: Negative.   Neurological: Positive for headaches.  Psychiatric/Behavioral: Negative for dysphoric mood. The patient is nervous/anxious.        Objective:   Physical Exam  Constitutional: She is oriented to person, place, and time. She appears well-developed and well-nourished.  Cardiovascular: Normal rate, regular rhythm, normal heart sounds and intact distal pulses.   Pulmonary/Chest: Effort normal and breath sounds normal.  Neurological: She is alert and oriented to person, place, and time.          Assessment & Plan:  We will refill the Xanax. The steroids have caused her BP to go up so we will add Metoprolol succinate 50 mg daily to her Maxzide. She will work on losing the weight. Recheck one month

## 2014-02-25 ENCOUNTER — Other Ambulatory Visit: Payer: Self-pay | Admitting: Family Medicine

## 2014-03-09 ENCOUNTER — Encounter: Payer: Self-pay | Admitting: Neurology

## 2014-03-09 ENCOUNTER — Ambulatory Visit (INDEPENDENT_AMBULATORY_CARE_PROVIDER_SITE_OTHER): Payer: BC Managed Care – PPO | Admitting: Neurology

## 2014-03-09 VITALS — BP 138/82 | HR 76 | Ht 69.0 in | Wt 285.0 lb

## 2014-03-09 DIAGNOSIS — G4440 Drug-induced headache, not elsewhere classified, not intractable: Secondary | ICD-10-CM

## 2014-03-09 DIAGNOSIS — M5416 Radiculopathy, lumbar region: Secondary | ICD-10-CM

## 2014-03-09 DIAGNOSIS — G43109 Migraine with aura, not intractable, without status migrainosus: Secondary | ICD-10-CM

## 2014-03-09 DIAGNOSIS — G4441 Drug-induced headache, not elsewhere classified, intractable: Secondary | ICD-10-CM

## 2014-03-09 MED ORDER — SUMATRIPTAN SUCCINATE 50 MG PO TABS: ORAL_TABLET | ORAL | Status: DC

## 2014-03-09 NOTE — Patient Instructions (Signed)
1.  Continue nortriptyline 50mg  at bedtime 2.  For acute migraines, take sumatriptan 50mg  at earliest onset of migraine.  May repeat once in 2 hours if needed.  Do not take sumatriptan more than 2 days out of the week. 3.  Follow up in 2 months or as needed.

## 2014-03-09 NOTE — Progress Notes (Signed)
NEUROLOGY FOLLOW UP OFFICE NOTE  [name] [id number]  HISTORY OF PRESENT ILLNESS: [name] is a 38 year old right-handed woman with hypertension, GERD, lumbar disc disease (L4-5) and fibromyalgia who presents for chronic migraine and medication-overuse headaches.  Records and images were personally reviewed where available.    UPDATE: She had been experiencing low back pain.  MRI of the lumbar spine was performed on 10/31/13, revealing progressed leftward disc protrusion at L4-5 with mild to moderate left lateral recess stenosis.  She was admitted to [facility] on 01/10/14 for exacerbation of low back pain radiating down the left leg associated with numbness and tingling.  She was started on prednisone and pain management.  She saw Dr. [name] of neurosurgery and underwent surgery last month.  She had previously been on steroids for the pain.  Since the surgery, she needs hydrocodone for the back pain.  She has been experiencing headaches daily.  She felt the nortriptyline was helping a little prior to these turn of events.  She may need another surgery in the near future.  Current abortive therapy:  sumatriptan 25mg .   Current preventative therapy:  nortriptyline 50mg  Celebrex, hydrocodone, gabapentin  HISTORY: Onset:  10 years ago Location:  Top of head, sometimes radiating down either side of face to the cheek Quality:  Pressure-like, sometimes pounding Initial intensity:  4-8/10; April 4-8/10 Aura:  Severe attacks preceded by scotomas and imbalance, x 30 minutes Associated symptoms:  Nausea, osmophobia, photophobia, phonophobia, hyperacusis, sometimes vomiting with severe ones Initial Duration:  Was 2-8 days.  April: 1-2 days Initial Frequency:  Almost daily.  April: she has about 5-7 headache-free days a month.  Half of headache days are migraine. Activity:  Needs to lay down Triggers/exacerbating factors:  Change in weather, stress, oranges, hot dogs Relieving factors:   Resting, applying pressure to head  Past abortive therapy:  Ibuprofen (avoids due to celebrex), tylenol (somewhat helpful), tried Imitrex shot once (helpfu but afraid to administer) Past preventative therapy:  topiramate (didn't feel well) Other medications include hydrocodone and celebrex for back pain (no longer takes celebrex every day).  Alcohol: no Caffeine:  A couple of sodas daily Smoking:  no Sleep hygiene:  Poor sleep (about 3 hours a night) Stress/depression:  Yes, raising her children with husband now out of town often for work Family history of headache:  Aunt with headaches.  Another aunt with headaches and aneurysm rupture.  History of abnormal brain MRIs with nonspecific punctate and patchy white matter hyperintensities, stable over 5 years. 04/30/02 MRI Brain w/wo: nonspecific supratentorial white matter disease. 02/29/04 MRI Brain w/wo: numerous nonspecific punctate and patchy white matter changes in the subcortical and periventricular white matter.  No abnormal enhancement. Similar to prior exam from 2003. 02/29/04 MRA Head: no large vessel occlusion or aneurysm identified. 07/28/06 MRI Brain w/wo: nonspecific punctate and patchy subcortical white matter hyperintensities in the subcortical and periventricular white matter, stable compared to prior study in 2005.  No abnormal enhancement. 07/28/06 MRA Head: Focal decreased caliber of the anterior cerebral arteries proximally with relative decrease in the caliber of the A1 segments compared to the A2 segments bilaterally.   Mild focal decrease in the caliber of the right middle cerebral artery at its origin.  PAST MEDICAL HISTORY: Past Medical History  Diagnosis Date  . Allergy   . Headache(784.0)   . GERD (gastroesophageal reflux disease)   . Polycystic ovaries   . B12 deficiency   . Peripheral neuropathy   .  Fibromyalgia   . Depression   . Migraine   . Anxiety   . Intervertebral disc protrusion 01/11/2014     MEDICATIONS: Current Outpatient Prescriptions on File Prior to Visit  Medication Sig Dispense Refill  . ALPRAZolam (XANAX) 0.5 MG tablet Take 1 tablet (0.5 mg total) by mouth 3 (three) times daily as needed.  90 tablet  5  . celecoxib (CELEBREX) 200 MG capsule Take 1 capsule (200 mg total) by mouth 2 (two) times daily.  60 capsule  11  . Cholecalciferol (VITAMIN D3) 2000 UNITS TABS Take 1 tablet by mouth daily.      . cyanocobalamin (,VITAMIN B-12,) 1000 MCG/ML injection Inject 1 mL (1,000 mcg total) into the muscle every 7 (seven) days.  12 mL  11  . cyclobenzaprine (FLEXERIL) 10 MG tablet Take 10 mg by mouth every 8 (eight) hours as needed for muscle spasms. PATIENT TAKES FOR MUSCLE SPASMS      . docusate sodium 100 MG CAPS Take 100 mg by mouth 2 (two) times daily.  10 capsule  0  . fluticasone (FLONASE) 50 MCG/ACT nasal spray Place 2 sprays into both nostrils daily.      Marland Kitchen. gabapentin (NEURONTIN) 300 MG capsule Take 1 capsule (300 mg total) by mouth 3 (three) times daily.  90 capsule  5  . hydrochlorothiazide (HYDRODIURIL) 25 MG tablet TAKE 1 TABLET BY MOUTH EVERY DAY  90 tablet  3  . metoprolol succinate (TOPROL-XL) 50 MG 24 hr tablet Take 1 tablet (50 mg total) by mouth daily. Take with or immediately following a meal.  30 tablet  3  . Multiple Vitamins-Minerals (MULTI-VITAMIN GUMMIES PO) Take 2 tablets by mouth daily.       . nortriptyline (PAMELOR) 50 MG capsule Take 1 capsule (50 mg total) by mouth at bedtime.  30 capsule  11  . oxyCODONE-acetaminophen (PERCOCET) 10-325 MG per tablet Take 1 tablet by mouth every 6 (six) hours as needed for pain.  60 tablet  0  . pantoprazole (PROTONIX) 40 MG tablet Take 1 tablet (40 mg total) by mouth 2 (two) times daily.  180 tablet  3  . potassium chloride (KLOR-CON 10) 10 MEQ tablet Take 1 tablet (10 mEq total) by mouth daily.  30 tablet  11  . promethazine (PHENERGAN) 25 MG tablet Take 1 tablet (25 mg total) by mouth every 6 (six) hours as needed  for nausea. PATIENT TAKES FOR NAUSEA  90 tablet  5  . diazepam (VALIUM) 5 MG tablet Take 1 tablet (5 mg total) by mouth every 8 (eight) hours as needed for muscle spasms.  60 tablet  5   No current facility-administered medications on file prior to visit.    ALLERGIES: Allergies  Allergen Reactions  . Gadolinium Derivatives Swelling and Other (See Comments)    Arm swelling, tingling, & redness. Radiologist Dr. Grace IsaacWatts recommends an injection of steroids if patient needs a gadolinium based contrast in the future.    FAMILY HISTORY: Family History  Problem Relation Age of Onset  . Fibromyalgia Mother   . Diabetes Mother   . Cancer Mother     thyrod  . Liver disease Mother   . Neuropathy Mother   . Hypertension Father   . Diabetes Father   . Heart disease Father     SOCIAL HISTORY: History   Social History  . Marital Status: Married    Spouse Name: N/A    Number of Children: N/A  . Years of Education: N/A  Occupational History  . Not on file.   Social History Main Topics  . Smoking status: Former Smoker    Quit date: 06/01/1998  . Smokeless tobacco: Never Used  . Alcohol Use: No  . Drug Use: No  . Sexual Activity: Yes    Birth Control/ Protection: Surgical   Other Topics Concern  . Not on file   Social History Narrative  . No narrative on file    REVIEW OF SYSTEMS: Constitutional: fatigue Eyes: No visual changes, double vision, eye pain Ear, nose and throat: No hearing loss, ear pain, nasal congestion, sore throat Cardiovascular: No chest pain, palpitations Respiratory:  No shortness of breath at rest or with exertion, wheezes GastrointestinaI: No nausea, vomiting, diarrhea, abdominal pain, fecal incontinence Genitourinary:  No dysuria, urinary retention or frequency Musculoskeletal:  Back pain Integumentary: No rash, pruritus, skin lesions Neurological: as above Psychiatric: Depression Endocrine: weight gain Hematologic/Lymphatic:  No anemia, purpura,  petechiae. Allergic/Immunologic: no itchy/runny eyes, nasal congestion, recent allergic reactions, rashes  PHYSICAL EXAM: Filed Vitals:   03/09/14 1530  BP: 138/82  Pulse: 76   General: No acute distress Head:  Normocephalic/atraumatic Neck: supple, no paraspinal tenderness, full range of motion Heart:  Regular rate and rhythm Lungs:  Clear to auscultation bilaterally Back: No paraspinal tenderness Neurological Exam: alert and oriented to person, place, and time. Attention span and concentration intact, recent and remote memory intact, fund of knowledge intact.  Speech fluent and not dysarthric, language intact.  CN II-XII intact. Fundoscopic exam unremarkable without vessel changes, exudates, hemorrhages or papilledema.  Bulk and tone normal, muscle strength 5/5 throughout.  Sensation to pinprick reduced on left foot and lateral lower leg.  Vibration intact.  Deep tendon reflexes 2+ throughout, toes downgoing.  Finger to nose intact.  Gait with limp.  IMPRESSION: Chronic migraine with aura Medication-overuse headache Lumbar radiculopathy  PLAN: Unfortunately, her headaches will be compromised due to need for chronic pain management regarding her back.  We will continue nortriptyline 50mg  daily for now.  We will wait and see how she does, once the pain is under better control and she no longer requires chronic pain medications.  In the meantime, I increased sumatriptan to 50mg  tablets, not to be taken more than 2x/day, no more than 2 days out of the week.  Follow up in 2 weeks.  Shon MilletAdam Jaffe, DO  CC:  Gershon CraneStephen Fry, MD

## 2014-04-02 ENCOUNTER — Encounter: Payer: Self-pay | Admitting: Family Medicine

## 2014-04-02 ENCOUNTER — Ambulatory Visit (INDEPENDENT_AMBULATORY_CARE_PROVIDER_SITE_OTHER): Payer: BC Managed Care – PPO | Admitting: Family Medicine

## 2014-04-02 ENCOUNTER — Ambulatory Visit: Payer: BC Managed Care – PPO | Admitting: Family Medicine

## 2014-04-02 VITALS — BP 120/94 | HR 104 | Temp 98.9°F | Ht 70.0 in

## 2014-04-02 DIAGNOSIS — I10 Essential (primary) hypertension: Secondary | ICD-10-CM

## 2014-04-02 DIAGNOSIS — R000 Tachycardia, unspecified: Secondary | ICD-10-CM

## 2014-04-02 MED ORDER — METOPROLOL SUCCINATE ER 100 MG PO TB24: 100.0000 mg | ORAL_TABLET | Freq: Every day | ORAL | Status: DC

## 2014-04-02 NOTE — Progress Notes (Signed)
   Subjective:    Patient ID: [name], female    DOB: 12/30/1975, 38 y.o.   MRN: [id number]  HPI Here to discuss her rapid heart rates and her HTN. She has been on Metoprolol 50 mg a day for some time. For the last month or two her rate has been 100-120 at rest and her BP has been in the 140s-150s over 90s. She feels tired but denies SOB or chest pain or HAs.    Review of Systems  Constitutional: Negative.   Respiratory: Negative.   Cardiovascular: Negative.   Neurological: Negative.        Objective:   Physical Exam  Constitutional: She appears well-developed and well-nourished.  Cardiovascular: Regular rhythm, normal heart sounds and intact distal pulses.   Rapid rate   Pulmonary/Chest: Effort normal and breath sounds normal.          Assessment & Plan:  We will increase the Metoprolol succinate to 100 mg daily. Get labs to check for metabolic disturbances.

## 2014-04-02 NOTE — Progress Notes (Signed)
Pre visit review using our clinic review tool, if applicable. No additional management support is needed unless otherwise documented below in the visit note. 

## 2014-04-03 ENCOUNTER — Encounter: Payer: Self-pay | Admitting: Family Medicine

## 2014-04-03 LAB — BASIC METABOLIC PANEL
BUN: 15 mg/dL (ref 6–23)
CO2: 28 mEq/L (ref 19–32)
Calcium: 9.7 mg/dL (ref 8.4–10.5)
Chloride: 103 mEq/L (ref 96–112)
Creatinine, Ser: 0.9 mg/dL (ref 0.4–1.2)
GFR: 71.06 mL/min (ref >60.00)
Glucose, Bld: 102 mg/dL — ABNORMAL HIGH (ref 70–99)
Potassium: 4.4 mEq/L (ref 3.5–5.1)
Sodium: 147 mEq/L — ABNORMAL HIGH (ref 135–145)

## 2014-04-03 LAB — CBC WITH DIFFERENTIAL/PLATELET
Basophils Absolute: 0.1 10*3/uL (ref 0.0–0.1)
Basophils Relative: 1 % (ref 0.0–3.0)
Eosinophils Absolute: 0.2 10*3/uL (ref 0.0–0.7)
Eosinophils Relative: 1.5 % (ref 0.0–5.0)
HCT: 44.8 % (ref 36.0–46.0)
Hemoglobin: 15 g/dL (ref 12.0–15.0)
Lymphocytes Relative: 33.5 % (ref 12.0–46.0)
Lymphs Abs: 3.5 10*3/uL (ref 0.7–4.0)
MCHC: 33.4 g/dL (ref 30.0–36.0)
MCV: 93.3 fl (ref 78.0–100.0)
Monocytes Absolute: 0.5 10*3/uL (ref 0.1–1.0)
Monocytes Relative: 4.6 % (ref 3.0–12.0)
Neutro Abs: 6.2 10*3/uL (ref 1.4–7.7)
Neutrophils Relative %: 59.4 % (ref 43.0–77.0)
Platelets: 374 10*3/uL (ref 150.0–400.0)
RBC: 4.81 Mil/uL (ref 3.87–5.11)
RDW: 13 % (ref 11.5–15.5)
WBC: 10.4 10*3/uL (ref 4.0–10.5)

## 2014-04-03 LAB — SEDIMENTATION RATE: Sed Rate: 47 mm/hr — ABNORMAL HIGH (ref 0–22)

## 2014-04-03 LAB — TSH: TSH: 1.06 u[IU]/mL (ref 0.35–4.50)

## 2014-04-03 LAB — T4, FREE: Free T4: 0.89 ng/dL (ref 0.60–1.60)

## 2014-04-03 LAB — T3, FREE: T3, Free: 4.1 pg/mL (ref 2.3–4.2)

## 2014-04-04 ENCOUNTER — Encounter: Payer: Self-pay | Admitting: Family Medicine

## 2014-04-04 DIAGNOSIS — R000 Tachycardia, unspecified: Secondary | ICD-10-CM | POA: Insufficient documentation

## 2014-04-05 ENCOUNTER — Telehealth: Payer: Self-pay | Admitting: Family Medicine

## 2014-04-05 NOTE — Telephone Encounter (Signed)
Pt would like the results to her lab work.  She is aware that today was your half day and will not see this message until tomorrow.

## 2014-04-06 NOTE — Telephone Encounter (Signed)
I spoke with pt and we are still waiting on results. I advised pt to follow up on results beginning of next week.

## 2014-04-09 ENCOUNTER — Other Ambulatory Visit: Payer: Self-pay | Admitting: Obstetrics and Gynecology

## 2014-04-09 ENCOUNTER — Encounter: Payer: Self-pay | Admitting: Family Medicine

## 2014-04-10 LAB — CYTOLOGY - PAP

## 2014-04-10 NOTE — Telephone Encounter (Signed)
This indicates an elevated level of inflammation in the body but it is not specific and not high enough to warrant a detailed evaluation

## 2014-04-25 ENCOUNTER — Other Ambulatory Visit: Payer: Self-pay | Admitting: Family Medicine

## 2014-05-10 ENCOUNTER — Other Ambulatory Visit: Payer: Self-pay | Admitting: Neurosurgery

## 2014-05-10 DIAGNOSIS — M5416 Radiculopathy, lumbar region: Secondary | ICD-10-CM

## 2014-05-11 ENCOUNTER — Emergency Department (HOSPITAL_COMMUNITY): Payer: BC Managed Care – PPO

## 2014-05-11 ENCOUNTER — Other Ambulatory Visit (INDEPENDENT_AMBULATORY_CARE_PROVIDER_SITE_OTHER): Payer: Self-pay | Admitting: Surgery

## 2014-05-11 ENCOUNTER — Emergency Department (HOSPITAL_COMMUNITY)
Admission: EM | Admit: 2014-05-11 | Discharge: 2014-05-11 | Disposition: A | Discharge status: Home / Self Care | Payer: BC Managed Care – PPO | Attending: Emergency Medicine | Admitting: Emergency Medicine

## 2014-05-11 ENCOUNTER — Telehealth: Payer: Self-pay | Admitting: Neurology

## 2014-05-11 ENCOUNTER — Encounter (HOSPITAL_COMMUNITY): Payer: Self-pay | Admitting: Emergency Medicine

## 2014-05-11 ENCOUNTER — Other Ambulatory Visit (INDEPENDENT_AMBULATORY_CARE_PROVIDER_SITE_OTHER): Payer: Self-pay

## 2014-05-11 ENCOUNTER — Ambulatory Visit: Payer: BC Managed Care – PPO | Admitting: Neurology

## 2014-05-11 DIAGNOSIS — Z8639 Personal history of other endocrine, nutritional and metabolic disease: Secondary | ICD-10-CM | POA: Diagnosis not present

## 2014-05-11 DIAGNOSIS — R0600 Dyspnea, unspecified: Secondary | ICD-10-CM | POA: Insufficient documentation

## 2014-05-11 DIAGNOSIS — G43909 Migraine, unspecified, not intractable, without status migrainosus: Secondary | ICD-10-CM | POA: Insufficient documentation

## 2014-05-11 DIAGNOSIS — R079 Chest pain, unspecified: Secondary | ICD-10-CM | POA: Diagnosis not present

## 2014-05-11 DIAGNOSIS — M25519 Pain in unspecified shoulder: Secondary | ICD-10-CM | POA: Diagnosis not present

## 2014-05-11 DIAGNOSIS — R1013 Epigastric pain: Secondary | ICD-10-CM | POA: Insufficient documentation

## 2014-05-11 DIAGNOSIS — G629 Polyneuropathy, unspecified: Secondary | ICD-10-CM | POA: Diagnosis not present

## 2014-05-11 DIAGNOSIS — R110 Nausea: Secondary | ICD-10-CM | POA: Diagnosis present

## 2014-05-11 DIAGNOSIS — F329 Major depressive disorder, single episode, unspecified: Secondary | ICD-10-CM | POA: Diagnosis not present

## 2014-05-11 DIAGNOSIS — M797 Fibromyalgia: Secondary | ICD-10-CM | POA: Diagnosis not present

## 2014-05-11 DIAGNOSIS — Z7951 Long term (current) use of inhaled steroids: Secondary | ICD-10-CM | POA: Diagnosis not present

## 2014-05-11 DIAGNOSIS — R000 Tachycardia, unspecified: Secondary | ICD-10-CM | POA: Insufficient documentation

## 2014-05-11 DIAGNOSIS — Z90710 Acquired absence of both cervix and uterus: Secondary | ICD-10-CM | POA: Diagnosis not present

## 2014-05-11 DIAGNOSIS — M549 Dorsalgia, unspecified: Secondary | ICD-10-CM | POA: Insufficient documentation

## 2014-05-11 DIAGNOSIS — K219 Gastro-esophageal reflux disease without esophagitis: Secondary | ICD-10-CM | POA: Diagnosis not present

## 2014-05-11 DIAGNOSIS — Z79899 Other long term (current) drug therapy: Secondary | ICD-10-CM | POA: Diagnosis not present

## 2014-05-11 DIAGNOSIS — D519 Vitamin B12 deficiency anemia, unspecified: Secondary | ICD-10-CM | POA: Insufficient documentation

## 2014-05-11 DIAGNOSIS — L723 Sebaceous cyst: Secondary | ICD-10-CM

## 2014-05-11 DIAGNOSIS — Z791 Long term (current) use of non-steroidal anti-inflammatories (NSAID): Secondary | ICD-10-CM | POA: Insufficient documentation

## 2014-05-11 DIAGNOSIS — F419 Anxiety disorder, unspecified: Secondary | ICD-10-CM | POA: Diagnosis not present

## 2014-05-11 DIAGNOSIS — R109 Unspecified abdominal pain: Secondary | ICD-10-CM

## 2014-05-11 DIAGNOSIS — R0602 Shortness of breath: Secondary | ICD-10-CM | POA: Insufficient documentation

## 2014-05-11 DIAGNOSIS — R1011 Right upper quadrant pain: Secondary | ICD-10-CM | POA: Diagnosis not present

## 2014-05-11 DIAGNOSIS — R52 Pain, unspecified: Secondary | ICD-10-CM

## 2014-05-11 DIAGNOSIS — Z9049 Acquired absence of other specified parts of digestive tract: Secondary | ICD-10-CM | POA: Diagnosis not present

## 2014-05-11 LAB — CBC WITH DIFFERENTIAL/PLATELET
Basophils Absolute: 0 10*3/uL (ref 0.0–0.1)
Basophils Relative: 0 % (ref 0–1)
Eosinophils Absolute: 0.1 10*3/uL (ref 0.0–0.7)
Eosinophils Relative: 1 % (ref 0–5)
HCT: 43.9 % (ref 36.0–46.0)
Hemoglobin: 14.8 g/dL (ref 12.0–15.0)
Lymphocytes Relative: 30 % (ref 12–46)
Lymphs Abs: 2.8 10*3/uL (ref 0.7–4.0)
MCH: 29.8 pg (ref 26.0–34.0)
MCHC: 33.7 g/dL (ref 30.0–36.0)
MCV: 88.3 fL (ref 78.0–100.0)
Monocytes Absolute: 0.5 10*3/uL (ref 0.1–1.0)
Monocytes Relative: 5 % (ref 3–12)
Neutro Abs: 5.9 10*3/uL (ref 1.7–7.7)
Neutrophils Relative %: 64 % (ref 43–77)
Platelets: 371 10*3/uL (ref 150–400)
RBC: 4.97 MIL/uL (ref 3.87–5.11)
RDW: 12.3 % (ref 11.5–15.5)
WBC: 9.3 10*3/uL (ref 4.0–10.5)

## 2014-05-11 LAB — COMPREHENSIVE METABOLIC PANEL
ALT: 16 U/L (ref 0–35)
AST: 18 U/L (ref 0–37)
Albumin: 3.7 g/dL (ref 3.5–5.2)
Alkaline Phosphatase: 64 U/L (ref 39–117)
Anion gap: 16 — ABNORMAL HIGH (ref 5–15)
BUN: 17 mg/dL (ref 6–23)
CO2: 27 mEq/L (ref 19–32)
Calcium: 9.5 mg/dL (ref 8.4–10.5)
Chloride: 97 mEq/L (ref 96–112)
Creatinine, Ser: 0.9 mg/dL (ref 0.50–1.10)
GFR calc Af Amer: >90 mL/min (ref >90)
GFR calc non Af Amer: 80 mL/min — ABNORMAL LOW (ref >90)
Glucose, Bld: 123 mg/dL — ABNORMAL HIGH (ref 70–99)
Potassium: 3.8 mEq/L (ref 3.7–5.3)
Sodium: 140 mEq/L (ref 137–147)
Total Bilirubin: 0.4 mg/dL (ref 0.3–1.2)
Total Protein: 7.6 g/dL (ref 6.0–8.3)

## 2014-05-11 LAB — URINALYSIS, ROUTINE W REFLEX MICROSCOPIC
Glucose, UA: NEGATIVE mg/dL
Hgb urine dipstick: NEGATIVE
Ketones, ur: NEGATIVE mg/dL
Leukocytes, UA: NEGATIVE
Nitrite: NEGATIVE
Protein, ur: NEGATIVE mg/dL
Specific Gravity, Urine: 1.029 (ref 1.005–1.030)
Urobilinogen, UA: 0.2 mg/dL (ref 0.0–1.0)
pH: 6 (ref 5.0–8.0)

## 2014-05-11 LAB — TROPONIN I
Troponin I: <0.3 ng/mL (ref <0.30)
Troponin I: <0.3 ng/mL (ref <0.30)

## 2014-05-11 LAB — D-DIMER, QUANTITATIVE: D-Dimer, Quant: <0.27 ug/mL-FEU (ref 0.00–0.48)

## 2014-05-11 LAB — LIPASE, BLOOD: Lipase: 22 U/L (ref 11–59)

## 2014-05-11 LAB — I-STAT TROPONIN, ED: Troponin i, poc: 0 ng/mL (ref 0.00–0.08)

## 2014-05-11 MED ORDER — METHYLPREDNISOLONE SODIUM SUCC 125 MG IJ SOLR
125.0000 mg | Freq: Once | INTRAMUSCULAR | Status: AC
  Administered 2014-05-11: 125 mg via INTRAVENOUS
  Filled 2014-05-11: qty 2

## 2014-05-11 MED ORDER — HYDROMORPHONE HCL 1 MG/ML IJ SOLN
1.0000 mg | Freq: Once | INTRAMUSCULAR | Status: AC
  Administered 2014-05-11: 1 mg via INTRAVENOUS
  Filled 2014-05-11: qty 1

## 2014-05-11 MED ORDER — FAMOTIDINE IN NACL 20-0.9 MG/50ML-% IV SOLN
20.0000 mg | Freq: Once | INTRAVENOUS | Status: AC
  Administered 2014-05-11: 20 mg via INTRAVENOUS
  Filled 2014-05-11: qty 50

## 2014-05-11 MED ORDER — IOHEXOL 350 MG/ML SOLN
100.0000 mL | Freq: Once | INTRAVENOUS | Status: AC | PRN
  Administered 2014-05-11: 100 mL via INTRAVENOUS

## 2014-05-11 MED ORDER — MORPHINE SULFATE 4 MG/ML IJ SOLN
4.0000 mg | Freq: Once | INTRAMUSCULAR | Status: AC
  Administered 2014-05-11: 4 mg via INTRAVENOUS
  Filled 2014-05-11: qty 1

## 2014-05-11 MED ORDER — OXYCODONE-ACETAMINOPHEN 5-325 MG PO TABS: 2.0000 | ORAL_TABLET | ORAL | Status: DC | PRN

## 2014-05-11 MED ORDER — SUCRALFATE 1 G PO TABS: 1.0000 g | ORAL_TABLET | Freq: Four times a day (QID) | ORAL | Status: DC

## 2014-05-11 MED ORDER — GI COCKTAIL ~~LOC~~
30.0000 mL | Freq: Once | ORAL | Status: AC
  Administered 2014-05-11: 30 mL via ORAL
  Filled 2014-05-11: qty 30

## 2014-05-11 MED ORDER — ONDANSETRON HCL 4 MG/2ML IJ SOLN
4.0000 mg | Freq: Once | INTRAMUSCULAR | Status: AC
  Administered 2014-05-11: 4 mg via INTRAVENOUS
  Filled 2014-05-11: qty 2

## 2014-05-11 NOTE — ED Provider Notes (Signed)
CSN: [id number]     Arrival date & time 05/11/14  1301 History   First MD Initiated Contact with Patient 05/11/14 1326     Chief Complaint  Patient presents with  . Nausea  . Abdominal Pain     (Consider location/radiation/quality/duration/timing/severity/associated sxs/prior Treatment) HPI Comments: Pt comes in with cc of abd pain, chest pain. Pt has hx of PCOS, GERD. Pt reports that she started has been having some nausea and epigastric abd discomfort starting Monday night. Starting yday, she has also been having some chest discomfort, midsternal - radiating to the right shoulder. No lower abdominal pain. Pt also has family hx of blood clots and personal hx of recent surgery - September.  Patient is a 38 y.o. female presenting with abdominal pain. The history is provided by the patient.  Abdominal Pain Associated symptoms: chest pain, nausea and shortness of breath   Associated symptoms: no constipation, no cough, no diarrhea, no hematuria and no vomiting     Past Medical History  Diagnosis Date  . Allergy   . Headache(784.0)   . GERD (gastroesophageal reflux disease)   . Polycystic ovaries   . B12 deficiency   . Peripheral neuropathy   . Fibromyalgia   . Depression   . Migraine   . Anxiety   . Intervertebral disc protrusion 01/11/2014   Past Surgical History  Procedure Laterality Date  . Appendectomy    . Abdominal hysterectomy    . Tonsillectomy    . Sinusotomy  12/14/06  . Wrist ganglion excision    . Ovarian cyst removal    . Oophorectomy      left overy  . Rectocele repair    . Bladder tack  2012  . Back surgery  2012    left laminectomy at L3-4 per Dr. Phoebe PerchHirsch   . Lumbar disc surgery  01-31-14    per Dr. Wynetta Emeryram    Family History  Problem Relation Age of Onset  . Fibromyalgia Mother   . Diabetes Mother   . Cancer Mother     thyrod  . Liver disease Mother   . Neuropathy Mother   . Hypertension Father   . Diabetes Father   . Heart disease Father     History  Substance Use Topics  . Smoking status: Former Smoker    Quit date: 06/01/1998  . Smokeless tobacco: Never Used  . Alcohol Use: No   OB History    Gravida Para Term Preterm AB TAB SAB Ectopic Multiple Living   2 2  2  0     2     Review of Systems  Constitutional: Negative for activity change.  HENT: Negative for facial swelling.   Respiratory: Positive for shortness of breath. Negative for cough and wheezing.   Cardiovascular: Positive for chest pain.  Gastrointestinal: Positive for nausea and abdominal pain. Negative for vomiting, diarrhea, constipation, blood in stool and abdominal distention.  Genitourinary: Negative for hematuria and difficulty urinating.  Musculoskeletal: Negative for neck pain.  Skin: Negative for color change.  Neurological: Negative for speech difficulty.  Hematological: Does not bruise/bleed easily.  Psychiatric/Behavioral: Negative for confusion.      Allergies  Gadolinium derivatives  Home Medications   Prior to Admission medications   Medication Sig Start Date End Date Taking? Authorizing Provider  ALPRAZolam [name](XANAX) 0.5 MG tablet Take 1 tablet (0.5 mg total) by mouth 3 (three) times daily as needed. Patient taking differently: Take 0.5 mg by mouth 3 (three) times daily as needed for  anxiety.  02/21/14  Yes [name], MD  calcium carbonate (TUMS - DOSED IN MG ELEMENTAL CALCIUM) 500 MG chewable tablet Chew 1 tablet by mouth 2 (two) times daily as needed for indigestion or heartburn.   Yes Historical Provider, MD  celecoxib (CELEBREX) 200 MG capsule Take 1 capsule (200 mg total) by mouth 2 (two) times daily. 11/30/13  Yes [name], MD  Cholecalciferol (VITAMIN D3) 2000 UNITS TABS Take 1 tablet by mouth daily.   Yes Historical Provider, MD  cyanocobalamin (,VITAMIN B-12,) 1000 MCG/ML injection Inject 1 mL (1,000 mcg total) into the muscle every 7 (seven) days. 10/12/13  Yes [name], MD  cyclobenzaprine (FLEXERIL) 10 MG tablet  Take 10 mg by mouth every 8 (eight) hours as needed for muscle spasms. PATIENT TAKES FOR MUSCLE SPASMS   Yes Historical Provider, MD  diazepam (VALIUM) 5 MG tablet Take 1 tablet (5 mg total) by mouth every 8 (eight) hours as needed for muscle spasms. 01/11/14  Yes [name], MD  fluconazole (DIFLUCAN) 150 MG tablet TAKE AS DIRECTED Patient taking differently: TAKE EVERY OTHER DAY AS NEEDED FOR YEAST INFECTION FROM ANTIBIOTIC 04/25/14  Yes [name], MD  fluticasone [facility]) 50 MCG/ACT nasal spray Place 2 sprays into both nostrils daily.   Yes Historical Provider, MD  gabapentin (NEURONTIN) 300 MG capsule Take 1 capsule (300 mg total) by mouth 3 (three) times daily. Patient taking differently: Take 300 mg by mouth 2 (two) times daily.  08/09/13  Yes [name], MD  hydrochlorothiazide (HYDRODIURIL) 25 MG tablet TAKE 1 TABLET BY MOUTH EVERY DAY 02/27/14  Yes [name], MD  metoprolol succinate (TOPROL-XL) 100 MG 24 hr tablet Take 1 tablet (100 mg total) by mouth daily. Take with or immediately following a meal. 04/02/14  Yes [name], MD  Multiple Vitamins-Minerals (MULTI-VITAMIN GUMMIES PO) Take 2 tablets by mouth daily.    Yes Historical Provider, MD  nortriptyline (PAMELOR) 50 MG capsule Take 1 capsule (50 mg total) by mouth at bedtime. 12/11/13  Yes Adam Gus Rankin, DO  oxyCODONE-acetaminophen (PERCOCET) 10-325 MG per tablet Take 1 tablet by mouth every 6 (six) hours as needed for pain. 11/30/13  Yes [name], MD  pantoprazole (PROTONIX) 40 MG tablet Take 1 tablet (40 mg total) by mouth 2 (two) times daily. 10/12/13  Yes [name], MD  potassium chloride (KLOR-CON 10) 10 MEQ tablet Take 1 tablet (10 mEq total) by mouth daily. Patient taking differently: Take 10 mEq by mouth every other day.  02/27/13  Yes [name], MD  promethazine (PHENERGAN) 25 MG tablet Take 1 tablet (25 mg total) by mouth every 6 (six) hours as needed for nausea. PATIENT TAKES FOR NAUSEA 10/12/13  Yes  [name], MD  simethicone (MYLICON) 80 MG chewable tablet Chew 160 mg by mouth every 6 (six) hours as needed (gas pain).   Yes Historical Provider, MD  SUMAtriptan (IMITREX) 50 MG tablet Take 1tab at earliest onset of headache.  May repeat once in 2 hours if headache persists or recurs. Patient taking differently: Take 50 mg by mouth every 2 (two) hours as needed for migraine. Take 1tab at earliest onset of headache.  May repeat once in 2 hours if headache persists or recurs. 03/09/14  Yes Adam Gus Rankin, DO  docusate sodium 100 MG CAPS Take 100 mg by mouth 2 (two) times daily. Patient not taking: Reported on 05/11/2014 01/13/14   Ramiro Harvest  V, MD   BP 120/86 mmHg  Pulse 100  Temp(Src) 97.7 F (36.5 C) (Oral)  Resp 18  SpO2 99%  LMP 12/13/1999 Physical Exam  Constitutional: She is oriented to person, place, and time. She appears well-developed and well-nourished.  HENT:  Head: Normocephalic and atraumatic.  Eyes: EOM are normal. Pupils are equal, round, and reactive to light.  Neck: Neck supple.  Cardiovascular: Normal rate, regular rhythm and normal heart sounds.   No murmur heard. Pulmonary/Chest: Effort normal. No respiratory distress.  Abdominal: Soft. She exhibits no distension. There is tenderness. There is no rebound and no guarding.  Epigastric tenderness - no guarding  Neurological: She is alert and oriented to person, place, and time.  Skin: Skin is warm and dry.  Nursing note and vitals reviewed.   ED Course  Procedures (including critical care time) Labs Review Labs Reviewed  COMPREHENSIVE METABOLIC PANEL - Abnormal; Notable for the following:    Glucose, Bld 123 (*)    GFR calc non Af Amer 80 (*)    Anion gap 16 (*)    All other components within normal limits  URINALYSIS, ROUTINE W REFLEX MICROSCOPIC - Abnormal; Notable for the following:    Color, Urine AMBER (*)    APPearance CLOUDY (*)    Bilirubin Urine SMALL (*)    All other components within  normal limits  CBC WITH DIFFERENTIAL  LIPASE, BLOOD  D-DIMER, QUANTITATIVE  TROPONIN I  TROPONIN I  Rosezena Sensor, ED    Imaging Review Dg Chest 2 View  05/11/2014   CLINICAL DATA:  Upper abdominal pain. Nausea. Chest pain. Shortness of breath.  EXAM: CHEST  2 VIEW  COMPARISON:  01/11/2014  FINDINGS: The heart size and mediastinal contours are within normal limits. Both lungs are clear. The visualized skeletal structures are unremarkable.  IMPRESSION: No active cardiopulmonary disease.   Electronically Signed   By: Herbie Baltimore M.D.   On: 05/11/2014 15:38   US Abdomen Limited Ruq  05/11/2014   CLINICAL DATA:  Five day history of epigastric and right upper quadrant abdominal pain associated with nausea and vomiting.  EXAM: US ABDOMEN LIMITED - RIGHT UPPER QUADRANT  COMPARISON:  Abdominal ultrasound 02/12/2012, 01/16/2006.  FINDINGS: Gallbladder:  No shadowing gallstones or echogenic sludge. No gallbladder wall thickening or pericholecystic fluid. Negative sonographic Murphy sign according to the ultrasound technologist.  Common bile duct:  Diameter: Approximately 2-3 mm.  Liver:  Diffusely increased and coarsened echotexture without focal hepatic parenchymal abnormality. Patent portal vein with hepatopetal flow.  IMPRESSION: 1. Diffuse hepatic steatosis and/or hepatocellular disease without focal hepatic parenchymal abnormality, unchanged since September, 2013. 2. Otherwise normal examination.   Electronically Signed   By: Hulan Saas M.D.   On: 05/11/2014 15:05     EKG Interpretation   Date/Time:  Friday May 11 2014 13:47:28 EST Ventricular Rate:  86 PR Interval:  131 QRS Duration: 91 QT Interval:  377 QTC Calculation: 451 R Axis:   56 Text Interpretation:  Sinus rhythm Abnormal R-wave progression, early  transition no acute findings Confirmed by Johnedward Brodrick, MD, Janey Genta [phone]) on  05/11/2014 2:18:17 PM      MDM   Final diagnoses:  Abdominal pain  Chest pain   Dyspnea    Pt comes in with chest pain, abd pain and shoulder pain. Chest pain is pleuritic in nature - pt has PE risk factors. WELLs score is elevated- as she is tachycardic, has tisk factors for PE (and thus PE is high on the  ddx), and thus CT PE will be ordered.  Also, pt has PCOS. With nausea, back pain and RUQ pain - US was ordered and is neg for gall stones - ovarian studies have been ordered.  Will sign out care to Dr. Freida BusmanAllen, as the imaging results are pending.  Derwood KaplanAnkit Jessie Schrieber, MD 05/11/14 [phone]

## 2014-05-11 NOTE — ED Provider Notes (Addendum)
Patient's albumin Dr. Rhunette Croftnanavati and patient's scans reviewed. Patient Dr. troponin negative. Patient's symptoms consistent with esophagitis. States is worse when she swallows. Patient with likely gastritis. She is already on a PPI. Will give patient pain medication here in GI referral  Toy BakerAnthony T Erynne Kealey, MD 05/11/14 2042  Toy BakerAnthony T Lakia Gritton, MD 05/11/14 2053

## 2014-05-11 NOTE — ED Notes (Signed)
Pt c/o nausea and abd pain that has been going on since late Monday night/Tuesday.  Pt believes it's her gallbladder bc she will have severe pain that after belching will get little relief. Pt states that pain is in epigastric that will radiate to her back on right side. Pt states that she hasnt been able to eat the past 3 days.

## 2014-05-11 NOTE — Discharge Instructions (Signed)
Abdominal Pain, Women °Abdominal (stomach, pelvic, or belly) pain can be caused by many things. It is important to tell your doctor: °· The location of the pain. °· Does it come and go or is it present all the time? °· Are there things that start the pain (eating certain foods, exercise)? °· Are there other symptoms associated with the pain (fever, nausea, vomiting, diarrhea)? °All of this is helpful to know when trying to find the cause of the pain. °CAUSES  °· Stomach: virus or bacteria infection, or ulcer. °· Intestine: appendicitis (inflamed appendix), regional ileitis (Crohn's disease), ulcerative colitis (inflamed colon), irritable bowel syndrome, diverticulitis (inflamed diverticulum of the colon), or cancer of the stomach or intestine. °· Gallbladder disease or stones in the gallbladder. °· Kidney disease, kidney stones, or infection. °· Pancreas infection or cancer. °· Fibromyalgia (pain disorder). °· Diseases of the female organs: °· Uterus: fibroid (non-cancerous) tumors or infection. °· Fallopian tubes: infection or tubal pregnancy. °· Ovary: cysts or tumors. °· Pelvic adhesions (scar tissue). °· Endometriosis (uterus lining tissue growing in the pelvis and on the pelvic organs). °· Pelvic congestion syndrome (female organs filling up with blood just before the menstrual period). °· Pain with the menstrual period. °· Pain with ovulation (producing an egg). °· Pain with an IUD (intrauterine device, birth control) in the uterus. °· Cancer of the female organs. °· Functional pain (pain not caused by a disease, may improve without treatment). °· Psychological pain. °· Depression. °DIAGNOSIS  °Your doctor will decide the seriousness of your pain by doing an examination. °· Blood tests. °· X-rays. °· Ultrasound. °· CT scan (computed tomography, special type of X-ray). °· MRI (magnetic resonance imaging). °· Cultures, for infection. °· Barium enema (dye inserted in the large intestine, to better view it with  X-rays). °· Colonoscopy (looking in intestine with a lighted tube). °· Laparoscopy (minor surgery, looking in abdomen with a lighted tube). °· Major abdominal exploratory surgery (looking in abdomen with a large incision). °TREATMENT  °The treatment will depend on the cause of the pain.  °· Many cases can be observed and treated at home. °· Over-the-counter medicines recommended by your caregiver. °· Prescription medicine. °· Antibiotics, for infection. °· Birth control pills, for painful periods or for ovulation pain. °· Hormone treatment, for endometriosis. °· Nerve blocking injections. °· Physical therapy. °· Antidepressants. °· Counseling with a psychologist or psychiatrist. °· Minor or major surgery. °HOME CARE INSTRUCTIONS  °· Do not take laxatives, unless directed by your caregiver. °· Take over-the-counter pain medicine only if ordered by your caregiver. Do not take aspirin because it can cause an upset stomach or bleeding. °· Try a clear liquid diet (broth or water) as ordered by your caregiver. Slowly move to a bland diet, as tolerated, if the pain is related to the stomach or intestine. °· Have a thermometer and take your temperature several times a day, and record it. °· Bed rest and sleep, if it helps the pain. °· Avoid sexual intercourse, if it causes pain. °· Avoid stressful situations. °· Keep your follow-up appointments and tests, as your caregiver orders. °· If the pain does not go away with medicine or surgery, you may try: °· Acupuncture. °· Relaxation exercises (yoga, meditation). °· Group therapy. °· Counseling. °SEEK MEDICAL CARE IF:  °· You notice certain foods cause stomach pain. °· Your home care treatment is not helping your pain. °· You need stronger pain medicine. °· You want your IUD removed. °· You feel faint or   lightheaded.  You develop nausea and vomiting.  You develop a rash.  You are having side effects or an allergy to your medicine. SEEK IMMEDIATE MEDICAL CARE IF:   Your  pain does not go away or gets worse.  You have a fever.  Your pain is felt only in portions of the abdomen. The right side could possibly be appendicitis. The left lower portion of the abdomen could be colitis or diverticulitis.  You are passing blood in your stools (bright red or black tarry stools, with or without vomiting).  You have blood in your urine.  You develop chills, with or without a fever.  You pass out. MAKE SURE YOU:   Understand these instructions.  Will watch your condition.  Will get help right away if you are not doing well or get worse. Document Released: 03/15/2007 Document Revised: 10/02/2013 Document Reviewed: 04/04/2009 [facility] Patient Information 2015 [place], [place]. This information is not intended to replace advice given to you by your health care provider. Make sure you discuss any questions you have with your health care provider. Esophagitis Esophagitis is inflammation of the esophagus. It can involve swelling, soreness, and pain in the esophagus. This condition can make it difficult and painful to swallow. CAUSES  Most causes of esophagitis are not serious. Many different factors can cause esophagitis, including:  Gastroesophageal reflux disease (GERD). This is when acid from your stomach flows up into the esophagus.  Recurrent vomiting.  An allergic-type reaction.  Certain medicines, especially those that come in large pills.  Ingestion of harmful chemicals, such as household cleaning products.  Heavy alcohol use.  An infection of the esophagus.  Radiation treatment for cancer.  Certain diseases such as sarcoidosis, Crohn's disease, and scleroderma. These diseases may cause recurrent esophagitis. SYMPTOMS   Trouble swallowing.  Painful swallowing.  Chest pain.  Difficulty breathing.  Nausea.  Vomiting.  Abdominal pain. DIAGNOSIS  Your caregiver will take your history and do a physical exam. Depending upon what your  caregiver finds, certain tests may also be done, including:  Barium X-ray. You will drink a solution that coats the esophagus, and X-rays will be taken.  Endoscopy. A lighted tube is put down the esophagus so your caregiver can examine the area.  Allergy tests. These can sometimes be arranged through follow-up visits. TREATMENT  Treatment will depend on the cause of your esophagitis. In some cases, steroids or other medicines may be given to help relieve your symptoms or to treat the underlying cause of your condition. Medicines that may be recommended include:  Viscous lidocaine, to soothe the esophagus.  Antacids.  Acid reducers.  Proton pump inhibitors.  Antiviral medicines for certain viral infections of the esophagus.  Antifungal medicines for certain fungal infections of the esophagus.  Antibiotic medicines, depending on the cause of the esophagitis. HOME CARE INSTRUCTIONS   Avoid foods and drinks that seem to make your symptoms worse.  Eat small, frequent meals instead of large meals.  Avoid eating for the 3 hours prior to your bedtime.  If you have trouble taking pills, use a pill splitter to decrease the size and likelihood of the pill getting stuck or injuring the esophagus on the way down. Drinking water after taking a pill also helps.  Stop smoking if you smoke.  Maintain a healthy weight.  Wear loose-fitting clothing. Do not wear anything tight around your waist that causes pressure on your stomach.  Raise the head of your bed 6 to 8 inches with wood  blocks to help you sleep. Extra pillows will not help.  Only take over-the-counter or prescription medicines as directed by your caregiver. SEEK IMMEDIATE MEDICAL CARE IF:  You have severe chest pain that radiates into your arm, neck, or jaw.  You feel sweaty, dizzy, or lightheaded.  You have shortness of breath.  You vomit blood.  You have difficulty or pain with swallowing.  You have bloody or black,  tarry stools.  You have a fever.  You have a burning sensation in the chest more than 3 times a week for more than 2 weeks.  You cannot swallow, drink, or eat.  You drool because you cannot swallow your saliva. MAKE SURE YOU:  Understand these instructions.  Will watch your condition.  Will get help right away if you are not doing well or get worse. Document Released: 06/25/2004 Document Revised: 08/10/2011 Document Reviewed: 01/16/2011 [facility]ExitCare Patient Information 2015 [place], [place]. This information is not intended to replace advice given to you by your health care provider. Make sure you discuss any questions you have with your health care provider.

## 2014-05-11 NOTE — Telephone Encounter (Signed)
Pt is having last min surgery today and will not be able to come to appt and will call back to resch

## 2014-05-11 NOTE — ED Notes (Signed)
US at bedside

## 2014-05-12 ENCOUNTER — Emergency Department (HOSPITAL_BASED_OUTPATIENT_CLINIC_OR_DEPARTMENT_OTHER)
Admission: EM | Admit: 2014-05-12 | Discharge: 2014-05-12 | Disposition: A | Discharge status: Home / Self Care | Payer: BC Managed Care – PPO | Attending: Emergency Medicine | Admitting: Emergency Medicine

## 2014-05-12 ENCOUNTER — Encounter (HOSPITAL_BASED_OUTPATIENT_CLINIC_OR_DEPARTMENT_OTHER): Payer: Self-pay

## 2014-05-12 DIAGNOSIS — Z791 Long term (current) use of non-steroidal anti-inflammatories (NSAID): Secondary | ICD-10-CM | POA: Diagnosis not present

## 2014-05-12 DIAGNOSIS — Z7951 Long term (current) use of inhaled steroids: Secondary | ICD-10-CM | POA: Diagnosis not present

## 2014-05-12 DIAGNOSIS — G43809 Other migraine, not intractable, without status migrainosus: Secondary | ICD-10-CM | POA: Insufficient documentation

## 2014-05-12 DIAGNOSIS — K219 Gastro-esophageal reflux disease without esophagitis: Secondary | ICD-10-CM | POA: Diagnosis not present

## 2014-05-12 DIAGNOSIS — Z79899 Other long term (current) drug therapy: Secondary | ICD-10-CM | POA: Diagnosis not present

## 2014-05-12 DIAGNOSIS — M797 Fibromyalgia: Secondary | ICD-10-CM | POA: Diagnosis not present

## 2014-05-12 DIAGNOSIS — D519 Vitamin B12 deficiency anemia, unspecified: Secondary | ICD-10-CM | POA: Diagnosis not present

## 2014-05-12 DIAGNOSIS — F419 Anxiety disorder, unspecified: Secondary | ICD-10-CM | POA: Insufficient documentation

## 2014-05-12 DIAGNOSIS — Z8742 Personal history of other diseases of the female genital tract: Secondary | ICD-10-CM | POA: Diagnosis not present

## 2014-05-12 DIAGNOSIS — Z87891 Personal history of nicotine dependence: Secondary | ICD-10-CM | POA: Insufficient documentation

## 2014-05-12 DIAGNOSIS — G9009 Other idiopathic peripheral autonomic neuropathy: Secondary | ICD-10-CM | POA: Diagnosis not present

## 2014-05-12 DIAGNOSIS — F329 Major depressive disorder, single episode, unspecified: Secondary | ICD-10-CM | POA: Diagnosis not present

## 2014-05-12 DIAGNOSIS — R51 Headache: Secondary | ICD-10-CM | POA: Diagnosis present

## 2014-05-12 MED ORDER — NAPROXEN 500 MG PO TABS: 500.0000 mg | ORAL_TABLET | Freq: Once | ORAL | Status: DC

## 2014-05-12 MED ORDER — METOCLOPRAMIDE HCL 5 MG/ML IJ SOLN
10.0000 mg | Freq: Once | INTRAMUSCULAR | Status: AC
  Administered 2014-05-12: 10 mg via INTRAVENOUS
  Filled 2014-05-12: qty 2

## 2014-05-12 MED ORDER — SUCRALFATE 1 G PO TABS
1.0000 g | ORAL_TABLET | Freq: Once | ORAL | Status: AC
  Administered 2014-05-12: 1 g via ORAL
  Filled 2014-05-12: qty 1

## 2014-05-12 MED ORDER — METOCLOPRAMIDE HCL 10 MG PO TABS: 10.0000 mg | ORAL_TABLET | Freq: Once | ORAL | Status: DC

## 2014-05-12 MED ORDER — SODIUM CHLORIDE 0.9 % IV BOLUS (SEPSIS)
1000.0000 mL | Freq: Once | INTRAVENOUS | Status: AC
  Administered 2014-05-12: 1000 mL via INTRAVENOUS

## 2014-05-12 MED ORDER — KETOROLAC TROMETHAMINE 30 MG/ML IJ SOLN
30.0000 mg | Freq: Once | INTRAMUSCULAR | Status: AC
  Administered 2014-05-12: 30 mg via INTRAVENOUS
  Filled 2014-05-12: qty 1

## 2014-05-12 MED ORDER — DIPHENHYDRAMINE HCL 50 MG/ML IJ SOLN
25.0000 mg | Freq: Once | INTRAMUSCULAR | Status: AC
  Administered 2014-05-12: 25 mg via INTRAVENOUS
  Filled 2014-05-12: qty 1

## 2014-05-12 MED ORDER — DIPHENHYDRAMINE HCL 25 MG PO TABS: 25.0000 mg | ORAL_TABLET | Freq: Once | ORAL | Status: DC

## 2014-05-12 NOTE — Discharge Instructions (Signed)

## 2014-05-12 NOTE — ED Provider Notes (Signed)
CSN: [id number]     Arrival date & time 05/12/14  7829 History   First MD Initiated Contact with Patient 05/12/14 [phone]     Chief Complaint  Patient presents with  . 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 Waking this morning with migraine headache and nausea. No chest pain this is resolved. No atypical features in that it is more diffuse and frontal. Typically left sided. Throbbing intermittent with associated photophobia and phonophobia. No fevers no chills no neck pain.  Past Medical History  Diagnosis Date  . Allergy   . Headache(784.0)   . GERD (gastroesophageal reflux disease)   . Polycystic ovaries   . B12 deficiency   . Peripheral neuropathy   . Fibromyalgia   . Depression   . Migraine   . Anxiety   . Intervertebral disc protrusion 01/11/2014   Past Surgical History  Procedure Laterality Date  . Appendectomy    . Abdominal hysterectomy    . Tonsillectomy    . Sinusotomy  12/14/06  . Wrist ganglion excision    . Ovarian cyst removal    . Oophorectomy      left overy  . Rectocele repair    . Bladder tack  2012  . Back surgery  2012    left laminectomy at L3-4 per Dr. Phoebe Perch   . Lumbar disc surgery  01-31-14    per Dr. [name]    Family History  Problem Relation Age of Onset  . Fibromyalgia Mother   . Diabetes Mother   . Cancer Mother     thyrod  . Liver disease Mother   . Neuropathy Mother   . Hypertension Father   . Diabetes Father   . Heart disease Father    History  Substance Use Topics  . Smoking status: Former Smoker    Quit date: 06/01/1998  . Smokeless tobacco: Never Used  . Alcohol Use: No   OB History    Gravida Para Term Preterm AB TAB SAB Ectopic Multiple Living   2 2  2  0     2     Review of Systems  Constitutional: Negative for fever, chills, diaphoresis, appetite change and fatigue.  HENT: Negative for mouth sores,  sore throat and trouble swallowing.   Eyes: Positive for photophobia and visual disturbance.  Respiratory: Negative for cough, chest tightness, shortness of breath and wheezing.   Cardiovascular: Negative for chest pain.  Gastrointestinal: Positive for nausea. Negative for vomiting, abdominal pain, diarrhea and abdominal distention.  Endocrine: Negative for polydipsia, polyphagia and polyuria.  Genitourinary: Negative for dysuria, frequency and hematuria.  Musculoskeletal: Negative for gait problem.  Skin: Negative for color change, pallor and rash.  Neurological: Positive for headaches. Negative for dizziness, syncope and light-headedness.  Hematological: Does not bruise/bleed easily.  Psychiatric/Behavioral: Negative for behavioral problems and confusion.      Allergies  Gadolinium derivatives  Home Medications   Prior to Admission medications   Medication Sig Start Date End Date Taking? Authorizing Provider  ALPRAZolam [name]) 0.5 MG tablet Take 1 tablet (0.5 mg total) by mouth 3 (three) times daily as needed. Patient taking differently: Take 0.5 mg by mouth 3 (three) times daily as needed for anxiety.  02/21/14   [name], MD  calcium carbonate (TUMS - DOSED IN MG ELEMENTAL CALCIUM) 500 MG chewable tablet Chew 1 tablet  by mouth 2 (two) times daily as needed for indigestion or heartburn.    Historical Provider, MD  celecoxib (CELEBREX) 200 MG capsule Take 1 capsule (200 mg total) by mouth 2 (two) times daily. 11/30/13   Nelwyn SalisburyStephen A Fry, MD  Cholecalciferol (VITAMIN D3) 2000 UNITS TABS Take 1 tablet by mouth daily.    Historical Provider, MD  cyanocobalamin (,VITAMIN B-12,) 1000 MCG/ML injection Inject 1 mL (1,000 mcg total) into the muscle every 7 (seven) days. 10/12/13   Nelwyn SalisburyStephen A Fry, MD  cyclobenzaprine (FLEXERIL) 10 MG tablet Take 10 mg by mouth every 8 (eight) hours as needed for muscle spasms. PATIENT TAKES FOR MUSCLE SPASMS    Historical Provider, MD  diazepam (VALIUM) 5 MG  tablet Take 1 tablet (5 mg total) by mouth every 8 (eight) hours as needed for muscle spasms. 01/11/14   Nelwyn SalisburyStephen A Fry, MD  diphenhydrAMINE (BENADRYL) 25 MG tablet Take 1 tablet (25 mg total) by mouth once. 05/12/14   Rolland PorterMark Leva Baine, MD  docusate sodium 100 MG CAPS Take 100 mg by mouth 2 (two) times daily. Patient not taking: Reported on 05/11/2014 01/13/14   Rodolph Bonganiel Thompson V, MD  fluconazole (DIFLUCAN) 150 MG tablet TAKE AS DIRECTED Patient taking differently: TAKE EVERY OTHER DAY AS NEEDED FOR YEAST INFECTION FROM ANTIBIOTIC 04/25/14   Nelwyn SalisburyStephen A Fry, MD  fluticasone [facility](FLONASE) 50 MCG/ACT nasal spray Place 2 sprays into both nostrils daily.    Historical Provider, MD  gabapentin (NEURONTIN) 300 MG capsule Take 1 capsule (300 mg total) by mouth 3 (three) times daily. Patient taking differently: Take 300 mg by mouth 2 (two) times daily.  08/09/13   Nelwyn SalisburyStephen A Fry, MD  hydrochlorothiazide (HYDRODIURIL) 25 MG tablet TAKE 1 TABLET BY MOUTH EVERY DAY 02/27/14   Nelwyn SalisburyStephen A Fry, MD  metoCLOPramide (REGLAN) 10 MG tablet Take 1 tablet (10 mg total) by mouth once. 05/12/14   Rolland PorterMark Laurence Crofford, MD  metoprolol succinate (TOPROL-XL) 100 MG 24 hr tablet Take 1 tablet (100 mg total) by mouth daily. Take with or immediately following a meal. 04/02/14   Nelwyn SalisburyStephen A Fry, MD  Multiple Vitamins-Minerals (MULTI-VITAMIN GUMMIES PO) Take 2 tablets by mouth daily.     Historical Provider, MD  naproxen (NAPROSYN) 500 MG tablet Take 1 tablet (500 mg total) by mouth once. 05/12/14   Rolland PorterMark Kellee Sittner, MD  nortriptyline (PAMELOR) 50 MG capsule Take 1 capsule (50 mg total) by mouth at bedtime. 12/11/13   Adam Gus Rankinobert Jaffe, DO  oxyCODONE-acetaminophen (PERCOCET) 10-325 MG per tablet Take 1 tablet by mouth every 6 (six) hours as needed for pain. 11/30/13   Nelwyn SalisburyStephen A Fry, MD  oxyCODONE-acetaminophen (PERCOCET/ROXICET) 5-325 MG per tablet Take 2 tablets by mouth every 4 (four) hours as needed for severe pain. 05/11/14   Toy BakerAnthony T Allen, MD  pantoprazole  (PROTONIX) 40 MG tablet Take 1 tablet (40 mg total) by mouth 2 (two) times daily. 10/12/13   Nelwyn SalisburyStephen A Fry, MD  potassium chloride (KLOR-CON 10) 10 MEQ tablet Take 1 tablet (10 mEq total) by mouth daily. Patient taking differently: Take 10 mEq by mouth every other day.  02/27/13   Nelwyn SalisburyStephen A Fry, MD  promethazine (PHENERGAN) 25 MG tablet Take 1 tablet (25 mg total) by mouth every 6 (six) hours as needed for nausea. PATIENT TAKES FOR NAUSEA 10/12/13   Nelwyn SalisburyStephen A Fry, MD  simethicone (MYLICON) 80 MG chewable tablet Chew 160 mg by mouth every 6 (six) hours as needed (gas pain).    Historical Provider, MD  sucralfate (CARAFATE) 1 G tablet Take 1 tablet (1 g total) by mouth 4 (four) times daily. 05/11/14   Toy Baker, MD  SUMAtriptan (IMITREX) 50 MG tablet Take 1tab at earliest onset of headache.  May repeat once in 2 hours if headache persists or recurs. Patient taking differently: Take 50 mg by mouth every 2 (two) hours as needed for migraine. Take 1tab at earliest onset of headache.  May repeat once in 2 hours if headache persists or recurs. 03/09/14   Adam Gus Rankin, DO   BP 113/69 mmHg  Pulse 98  Temp(Src) 97.9 F (36.6 C) (Oral)  Resp 18  Ht 5\' 9"  (1.753 m)  Wt 277 lb (125.646 kg)  BMI 40.89 kg/m2  SpO2 97%  LMP 12/13/1999 Physical Exam  Constitutional: She is oriented to person, place, and time. She appears well-developed and well-nourished. No distress.  HENT:  Head: Normocephalic.  Eyes: Conjunctivae are normal. Pupils are equal, round, and reactive to light. No scleral icterus.  Neck: Normal range of motion. Neck supple. No thyromegaly present.  Cardiovascular: Normal rate and regular rhythm.  Exam reveals no gallop and no friction rub.   No murmur heard. Pulmonary/Chest: Effort normal and breath sounds normal. No respiratory distress. She has no wheezes. She has no rales.  Abdominal: Soft. Bowel sounds are normal. She exhibits no distension. There is no tenderness. There is no  rebound.  Musculoskeletal: Normal range of motion.  Neurological: She is alert and oriented to person, place, and time.  Age arise from the light. Normal plantar exam. Normal cranial nerves. Normal peripheral neurological exam. Supple neck.  Skin: Skin is warm and dry. No rash noted.  Psychiatric: She has a normal mood and affect. Her behavior is normal.    ED Course  Procedures (including critical care time) Labs Review Labs Reviewed - No data to display  Imaging Review Dg Chest 2 View  05/11/2014   CLINICAL DATA:  Upper abdominal pain. Nausea. Chest pain. Shortness of breath.  EXAM: CHEST  2 VIEW  COMPARISON:  01/11/2014  FINDINGS: The heart size and mediastinal contours are within normal limits. Both lungs are clear. The visualized skeletal structures are unremarkable.  IMPRESSION: No active cardiopulmonary disease.   Electronically Signed   By: Herbie Baltimore M.D.   On: 05/11/2014 15:38   Ct Angio Chest Pe W/cm &/or Wo Cm  05/11/2014   CLINICAL DATA:  Shortness of breath and mid chest pain. Evaluate for pulmonary embolism.  EXAM: CT ANGIOGRAPHY CHEST WITH CONTRAST  TECHNIQUE: Multidetector CT imaging of the chest was performed using the standard protocol during bolus administration of intravenous contrast. Multiplanar CT image reconstructions and MIPs were obtained to evaluate the vascular anatomy.  CONTRAST:  OMNIPAQUE IOHEXOL 350 MG/ML SOLN  COMPARISON:  Chest x-ray today.  FINDINGS: Lungs are well inflated with subtle hazy dependent bibasilar opacification likely atelectasis. No focal consolidation or effusion. Airways are within normal. Heart is normal in size. Pulmonary arterial system is well opacified without evidence of emboli. There is no evidence of mediastinal or hilar adenopathy. Remaining mediastinal structures are within normal.  Images through the upper abdomen are unremarkable. There are mild degenerative changes of the spine.  Review of the MIP images confirms the  above findings.  IMPRESSION: No evidence of pulmonary embolism. No acute cardiopulmonary disease.   Electronically Signed   By: Elberta Fortis M.D.   On: 05/11/2014 18:47   US Transvaginal Non-ob  05/11/2014   CLINICAL DATA:  Pelvic  pain  EXAM: TRANSABDOMINAL AND TRANSVAGINAL ULTRASOUND OF PELVIS  TECHNIQUE: Both transabdominal and transvaginal ultrasound examinations of the pelvis were performed. Transabdominal technique was performed for global imaging of the pelvis including uterus, ovaries, adnexal regions, and pelvic cul-de-sac. It was necessary to proceed with endovaginal exam following the transabdominal exam to visualize the ovaries.  COMPARISON:  None  FINDINGS: Uterus  Surgically removed  Right ovary  Measurements: 3.9 x 3.0 x 2.5 cm. Multiple cysts are identified. The largest of these measures 3.5 cm in greatest dimension.  Left ovary  Surgically removed.  Other findings  No free fluid.  IMPRESSION: Right ovarian cysts.  No acute abnormality is noted.  Status post hysterectomy and left oophorectomy.   Electronically Signed   By: Alcide CleverMark  Lukens M.D.   On: 05/11/2014 19:58   [place] Pelvis Complete  05/11/2014   CLINICAL DATA:  Pelvic pain  EXAM: TRANSABDOMINAL AND TRANSVAGINAL ULTRASOUND OF PELVIS  TECHNIQUE: Both transabdominal and transvaginal ultrasound examinations of the pelvis were performed. Transabdominal technique was performed for global imaging of the pelvis including uterus, ovaries, adnexal regions, and pelvic cul-de-sac. It was necessary to proceed with endovaginal exam following the transabdominal exam to visualize the ovaries.  COMPARISON:  None  FINDINGS: Uterus  Surgically removed  Right ovary  Measurements: 3.9 x 3.0 x 2.5 cm. Multiple cysts are identified. The largest of these measures 3.5 cm in greatest dimension.  Left ovary  Surgically removed.  Other findings  No free fluid.  IMPRESSION: Right ovarian cysts.  No acute abnormality is noted.  Status post hysterectomy and left  oophorectomy.   Electronically Signed   By: Alcide CleverMark  Lukens M.D.   On: 05/11/2014 19:58   [place] Abdomen Limited Ruq  05/11/2014   CLINICAL DATA:  Five day history of epigastric and right upper quadrant abdominal pain associated with nausea and vomiting.  EXAM: US ABDOMEN LIMITED - RIGHT UPPER QUADRANT  COMPARISON:  Abdominal ultrasound 02/12/2012, 01/16/2006.  FINDINGS: Gallbladder:  No shadowing gallstones or echogenic sludge. No gallbladder wall thickening or pericholecystic fluid. Negative sonographic Murphy sign according to the ultrasound technologist.  Common bile duct:  Diameter: Approximately 2-3 mm.  Liver:  Diffusely increased and coarsened echotexture without focal hepatic parenchymal abnormality. Patent portal vein with hepatopetal flow.  IMPRESSION: 1. Diffuse hepatic steatosis and/or hepatocellular disease without focal hepatic parenchymal abnormality, unchanged since September, 2013. 2. Otherwise normal examination.   Electronically Signed   By: Hulan Saashomas  Lawrence M.D.   On: 05/11/2014 15:05     EKG Interpretation None      MDM   Final diagnoses:  Other migraine without status migrainosus, not intractable    On reevaluation patient sleeping states her headache is gone. Discharged with prescription for Benadryl/Reglan, naproxen for any migraine headaches refractory to her home Imitrex use.    Rolland PorterMark Navil Kole, MD 05/12/14 [phone]

## 2014-05-12 NOTE — ED Notes (Signed)
Patient reports that she developed migraine headache this am 645am. Took her imitrex and phenergan with minimal relief,. History of same. Denies trauma. [facility] and wonders if the headache is a result of the meds prescribed for her yesterday

## 2014-05-14 NOTE — Telephone Encounter (Signed)
See previous documentation, appt marked as no show b/c pt provided less than 24 hours notice but a no show letter will not be sent / Sherri S.

## 2014-05-17 ENCOUNTER — Ambulatory Visit (INDEPENDENT_AMBULATORY_CARE_PROVIDER_SITE_OTHER): Payer: BC Managed Care – PPO | Admitting: Physician Assistant

## 2014-05-17 ENCOUNTER — Encounter: Payer: Self-pay | Admitting: Physician Assistant

## 2014-05-17 VITALS — BP 110/70 | HR 84 | Ht 69.0 in | Wt 278.4 lb

## 2014-05-17 DIAGNOSIS — K209 Esophagitis, unspecified without bleeding: Secondary | ICD-10-CM

## 2014-05-17 DIAGNOSIS — R1013 Epigastric pain: Secondary | ICD-10-CM

## 2014-05-17 MED ORDER — SUCRALFATE 1 GM/10ML PO SUSP: 1.0000 g | Freq: Three times a day (TID) | ORAL | Status: DC

## 2014-05-17 NOTE — Progress Notes (Signed)
Patient ID: [name], female   DOB: 09-Nov-1975, 38 y.o.   MRN: [id number]   Subjective:    Patient ID: [name], female    DOB: [birth year], 38 y.o.   MRN: [id number]  HPI [name] is a pleasant 38 year old white female known to Dr. Arlyce Dice remotely. She has history of chronic GERD, polycystic ovarian disease, fibromyalgia, lumbar disc disease for which she underwent laminectomy in September 2015, and morbid obesity. Patient had an ER visit on 05/11/2014 with complaints of chest pain and nausea. She had workup there to include CT Marylene Land of the chest which was negative for pulmonary embolus, baseline labs which were unremarkable and also had undergone a pelvic ultrasound which showed multiple right ovarian cysts she had upper abdominal ultrasound which showed no gallstones or wall thickening normal common bile duct and diffuse hepatic steatosis. Patient states that she had onset about 10 days ago with acute's of his subxiphoid and epigastric discomfort radiating into her chest. She says she developed a lot of burping and belching and the belching seemed to relieve some of her symptoms temporarily. She also had some odynophagia decrease in appetite and nausea. She says she felt bad all overlie she had some sort of a viral syndrome. After workup in the emergency room she has been taking Prilosec twice daily and had Carafate added to her regimen. He says she is feeling better this week but still has some discomfort. On further questioning she had taken several doses of Celebrex just prior to the onset of her symptoms. She had also been taking some potassium supplement. Says she has taken Celebrex in the past without difficulty she's not had any over the past week or so. Currently she is able to eat a bland diet is not really having any further odynophagia. She still having some belching and discomfort in the subxiphoid area.  Review of Systems Pertinent positive and negative review of systems were noted  in the above HPI section.  All other review of systems was otherwise negative.  Outpatient Encounter Prescriptions as of 05/17/2014  Medication Sig  . ALPRAZolam (XANAX) 0.5 MG tablet Take 1 tablet (0.5 mg total) by mouth 3 (three) times daily as needed. (Patient taking differently: Take 0.5 mg by mouth 3 (three) times daily as needed for anxiety. )  . calcium carbonate (TUMS - DOSED IN MG ELEMENTAL CALCIUM) 500 MG chewable tablet Chew 1 tablet by mouth 2 (two) times daily as needed for indigestion or heartburn.  . celecoxib (CELEBREX) 200 MG capsule Take 1 capsule (200 mg total) by mouth 2 (two) times daily.  . Cholecalciferol (VITAMIN D3) 2000 UNITS TABS Take 1 tablet by mouth daily.  . cyanocobalamin (,VITAMIN B-12,) 1000 MCG/ML injection Inject 1 mL (1,000 mcg total) into the muscle every 7 (seven) days.  . cyclobenzaprine (FLEXERIL) 10 MG tablet Take 10 mg by mouth every 8 (eight) hours as needed for muscle spasms. PATIENT TAKES FOR MUSCLE SPASMS  . diazepam (VALIUM) 5 MG tablet Take 1 tablet (5 mg total) by mouth every 8 (eight) hours as needed for muscle spasms.  . diphenhydrAMINE (BENADRYL) 25 MG tablet Take 1 tablet (25 mg total) by mouth once. (Patient taking differently: Take 25 mg by mouth once. Use if has migraine for homemade cocktail)  . docusate sodium 100 MG CAPS Take 100 mg by mouth 2 (two) times daily.  . fluconazole (DIFLUCAN) 150 MG tablet TAKE AS DIRECTED (Patient taking differently: TAKE EVERY OTHER DAY AS NEEDED FOR YEAST INFECTION FROM ANTIBIOTIC)  .  fluticasone (FLONASE) 50 MCG/ACT nasal spray Place 2 sprays into both nostrils daily.  Marland Kitchen. gabapentin (NEURONTIN) 300 MG capsule Take 1 capsule (300 mg total) by mouth 3 (three) times daily. (Patient taking differently: Take 300 mg by mouth 2 (two) times daily. )  . hydrochlorothiazide (HYDRODIURIL) 25 MG tablet TAKE 1 TABLET BY MOUTH EVERY DAY  . metoCLOPramide (REGLAN) 10 MG tablet Take 1 tablet (10 mg total) by mouth once.  (Patient taking differently: Take 10 mg by mouth once. Use if has migraine for homemade cocktail)  . metoprolol succinate (TOPROL-XL) 100 MG 24 hr tablet Take 1 tablet (100 mg total) by mouth daily. Take with or immediately following a meal.  . Multiple Vitamins-Minerals (MULTI-VITAMIN GUMMIES PO) Take 2 tablets by mouth daily.   . naproxen (NAPROSYN) 500 MG tablet Take 1 tablet (500 mg total) by mouth once. (Patient taking differently: Take 500 mg by mouth once. Use if has migraine for homemade cocktail)  . nortriptyline (PAMELOR) 50 MG capsule Take 1 capsule (50 mg total) by mouth at bedtime.  Marland Kitchen. oxyCODONE-acetaminophen (PERCOCET) 10-325 MG per tablet Take 1 tablet by mouth every 6 (six) hours as needed for pain.  Marland Kitchen. oxyCODONE-acetaminophen (PERCOCET/ROXICET) 5-325 MG per tablet Take 2 tablets by mouth every 4 (four) hours as needed for severe pain.  . pantoprazole (PROTONIX) 40 MG tablet Take 1 tablet (40 mg total) by mouth 2 (two) times daily.  . potassium chloride (KLOR-CON 10) 10 MEQ tablet Take 1 tablet (10 mEq total) by mouth daily. (Patient taking differently: Take 10 mEq by mouth every other day. Doesn't take on a regular basis)  . promethazine (PHENERGAN) 25 MG tablet Take 1 tablet (25 mg total) by mouth every 6 (six) hours as needed for nausea. PATIENT TAKES FOR NAUSEA  . simethicone (MYLICON) 80 MG chewable tablet Chew 160 mg by mouth every 6 (six) hours as needed (gas pain).  . SUMAtriptan (IMITREX) 50 MG tablet Take 1tab at earliest onset of headache.  May repeat once in 2 hours if headache persists or recurs. (Patient taking differently: Take 50 mg by mouth every 2 (two) hours as needed for migraine. Take 1tab at earliest onset of headache.  May repeat once in 2 hours if headache persists or recurs.)  . [DISCONTINUED] sucralfate (CARAFATE) 1 G tablet Take 1 tablet (1 g total) by mouth 4 (four) times daily.  . sucralfate (CARAFATE) 1 GM/10ML suspension Take 10 mLs (1 g total) by mouth 4  (four) times daily -  with meals and at bedtime.   Allergies  Allergen Reactions  . Gadolinium Derivatives Swelling and Other (See Comments)    Arm swelling, tingling, & redness. Radiologist Dr. Grace IsaacWatts recommends an injection of steroids if patient needs a gadolinium based contrast in the future.   Patient Active Problem List   Diagnosis Date Noted  . Chronic tachycardia 04/04/2014  . Sciatica 01/11/2014  . Back pain 01/11/2014  . Leukocytosis, unspecified 01/11/2014  . Intervertebral disc protrusion 01/11/2014  . HTN (hypertension) 01/04/2013  . Hemorrhoids 04/25/2012  . Urinary retention 12/11/2010  . CERUMEN IMPACTION 01/28/2010  . UTI 10/31/2009  . DYSURIA 10/31/2009  . ACUTE BRONCHITIS 06/21/2009  . DEPRESSION 04/08/2009  . FIBROMYALGIA 10/11/2008  . Morbid obesity 08/10/2008  . ACNE VULGARIS 08/10/2008  . INSOMNIA 06/20/2008  . WEAKNESS 06/20/2008  . VITAMIN B12 DEFICIENCY 03/23/2008  . PERIPHERAL NEUROPATHY 02/27/2008  . LEG EDEMA 02/27/2008  . CONSTIPATION 08/22/2007  . ACUTE CYSTITIS 04/04/2007  . SINUSITIS, ACUTE FRONTAL 02/07/2007  .  ANXIETY DISORDER, GENERALIZED 01/04/2007  . ALLERGIC RHINITIS 01/04/2007  . GERD 01/04/2007  . DEGENERATIVE JOINT DISEASE, KNEES, BILATERAL 01/04/2007  . HEADACHE 01/04/2007  . RENAL CALCULUS, HX OF 01/04/2007   History   Social History  . Marital Status: Married    Spouse Name: N/A    Number of Children: 2  . Years of Education: N/A   Occupational History  . housewife    Social History Main Topics  . Smoking status: Former Smoker    Quit date: 06/01/1998  . Smokeless tobacco: Never Used  . Alcohol Use: No  . Drug Use: No  . Sexual Activity: Yes    Birth Control/ Protection: Surgical   Other Topics Concern  . Not on file   Social History Narrative    Ms. Watrous's family history includes Cirrhosis in her mother; Diabetes in her father and mother; Fibromyalgia in her mother; Heart attack in her father; Heart  disease in her father; Hypertension in her father; Liver disease in her mother; Neuropathy in her mother; Prostate cancer in her father; Pulmonary embolism in her mother; Thyroid cancer in her mother. There is no history of Colon cancer.      Objective:    Filed Vitals:   05/17/14 0928  BP: 110/70  Pulse: 84    Physical Exam   well-developed white female in no acute distress, pleasant height 5 foot 9 weight 278. HEENT; nontraumatic normocephalic EOMI PERRLA sclera anicteric, Supple; no JVD, Cardiovascular; regular rate and rhythm with S1-S2 no murmur rub or gallop, Pulmonary; clear bilaterally, Abdomen ;obese soft and basically nontender there is no palpable mass or hepatosplenomegaly bowel sounds are present, Rectal exam not done, Ext; knees no clubbing cyanosis or edema skin warm and dry, Psych; mood and affect appropriate    Assessment & Plan:   #1  38 yo female with probable acute esophagitis. This may be medication induced with recent Celebrex and potassium use. She is improved with twice a day PPI and Carafate. #2 history of chronic GERD #3 fibromyalgia Number for polycystic ovarian disease #5 chronic anxiety #6 diffuse hepatic steatosis on ultrasound, recent normal LFTs   Plan; continue twice daily Protonix 1 month then if asymptomatic decrease to once daily Continue Carafate for 2 more weeks will send prescription for Carafate suspension 1 g 4 times daily between meals and at bedtime  Elevate head of bed 45  bland diet  I do not think that endoscopy will change her management at this time She will follow up with Dr. Arlyce DiceKaplan or myself as needed. We will need to have follow-up discussions regarding diffuse hepatic steatosis.    Coolidge Gossard S Byran Bilotti PA-C 05/17/2014

## 2014-05-17 NOTE — Patient Instructions (Addendum)
  Food Choices for Gastroesophageal Reflux Disease When you have gastroesophageal reflux disease (GERD), the foods you eat and your eating habits are very important. Choosing the right foods can help ease the discomfort of GERD. WHAT GENERAL GUIDELINES DO I NEED TO FOLLOW?  Choose fruits, vegetables, whole grains, low-fat dairy products, and low-fat meat, fish, and poultry.  Limit fats such as oils, salad dressings, butter, nuts, and avocado.  Keep a food diary to identify foods that cause symptoms.  Avoid foods that cause reflux. These may be different for different people.  Eat frequent small meals instead of three large meals each day.  Eat your meals slowly, in a relaxed setting.  Limit fried foods.  Cook foods using methods other than frying.  Avoid drinking alcohol.  Avoid drinking large amounts of liquids with your meals.  Avoid bending over or lying down until 2-3 hours after eating. WHAT FOODS ARE NOT RECOMMENDED? The following are some foods and drinks that may worsen your symptoms: Vegetables Tomatoes. Tomato juice. Tomato and spaghetti sauce. Chili peppers. Onion and garlic. Horseradish. Fruits Oranges, grapefruit, and lemon (fruit and juice). Meats High-fat meats, fish, and poultry. This includes hot dogs, ribs, ham, sausage, salami, and bacon. Dairy Whole milk and chocolate milk. Sour cream. Cream. Butter. Ice cream. Cream cheese.  Beverages Coffee and tea, with or without caffeine. Carbonated beverages or energy drinks. Condiments Hot sauce. Barbecue sauce.  Sweets/Desserts Chocolate and cocoa. Donuts. Peppermint and spearmint. Fats and Oils High-fat foods, including [place] fries and potato chips. Other Vinegar. Strong spices, such as black pepper, white pepper, red pepper, cayenne, curry powder, cloves, ginger, and chili powder. The items listed above may not be a complete list of foods and beverages to avoid. Contact your dietitian for more  information. Document Released: 05/18/2005 Document Revised: 05/23/2013 Document Reviewed: 03/22/2013 [facility] Patient Information 2015 [employer] [place]. This information is not intended to replace advice given to you by your health care provider. Make sure you discuss any questions you have with your health care provider.   We have given you information on Reflux. Elevate the head of your bed 45 degrees. Try to eat a bland diet for now. Continue the Protonix .

## 2014-05-21 ENCOUNTER — Ambulatory Visit (INDEPENDENT_AMBULATORY_CARE_PROVIDER_SITE_OTHER): Payer: BC Managed Care – PPO | Admitting: Family Medicine

## 2014-05-21 ENCOUNTER — Encounter: Payer: Self-pay | Admitting: Family Medicine

## 2014-05-21 VITALS — BP 138/96 | Temp 98.0°F | Ht 69.0 in | Wt 278.0 lb

## 2014-05-21 DIAGNOSIS — I10 Essential (primary) hypertension: Secondary | ICD-10-CM

## 2014-05-21 DIAGNOSIS — L659 Nonscarring hair loss, unspecified: Secondary | ICD-10-CM | POA: Insufficient documentation

## 2014-05-21 DIAGNOSIS — R5383 Other fatigue: Secondary | ICD-10-CM

## 2014-05-21 NOTE — Progress Notes (Signed)
   Subjective:    Patient ID: [name], female    DOB: 07/12/1975, 38 y.o.   MRN: [id number]  HPI Here asking for advice about getting off Metoprolol. She has been taking 100 mg daily for a few months and she does not like how she feels. This really has not lowered her BP or her heart rate very much, even though we have increased the dose. She feels tired all the time and her hair has been falling out for a month or so. She read that these can be side effects of the medication and she wants to stop it. She was in the ER on 05-11-14 and again on 05-12-14 for GERD and then for a migraine.    Review of Systems  Constitutional: Positive for fatigue.  Respiratory: Negative.   Cardiovascular: Negative.        Objective:   Physical Exam  Constitutional: She appears well-developed and well-nourished.  Neck: No thyromegaly present.  Cardiovascular: Regular rhythm, normal heart sounds and intact distal pulses.   Rapid rate   Pulmonary/Chest: Effort normal and breath sounds normal. No respiratory distress. She has no wheezes. She has no rales. She exhibits no tenderness.  Lymphadenopathy:    She has no cervical adenopathy.          Assessment & Plan:  We agreed to wean off Metoprolol. She will take 50 mg a day of this for one week and then take this every other day for one week, and then stop. She will stay on HCTZ. Recheck here in 4 weeks

## 2014-05-21 NOTE — Progress Notes (Signed)
Pre visit review using our clinic review tool, if applicable. No additional management support is needed unless otherwise documented below in the visit note. 

## 2014-05-22 ENCOUNTER — Ambulatory Visit
Admission: RE | Admit: 2014-05-22 | Discharge: 2014-05-22 | Disposition: A | Discharge status: Home / Self Care | Payer: BC Managed Care – PPO | Source: Ambulatory Visit | Attending: Neurosurgery | Admitting: Neurosurgery

## 2014-05-22 ENCOUNTER — Other Ambulatory Visit: Payer: Self-pay | Admitting: *Deleted

## 2014-05-22 DIAGNOSIS — M5416 Radiculopathy, lumbar region: Secondary | ICD-10-CM

## 2014-05-22 MED ORDER — GABAPENTIN 300 MG PO CAPS: 300.0000 mg | ORAL_CAPSULE | Freq: Three times a day (TID) | ORAL | Status: DC

## 2014-06-25 ENCOUNTER — Other Ambulatory Visit: Payer: Self-pay | Admitting: Neurology

## 2014-06-29 ENCOUNTER — Encounter: Payer: Self-pay | Admitting: Family Medicine

## 2014-06-29 ENCOUNTER — Ambulatory Visit (INDEPENDENT_AMBULATORY_CARE_PROVIDER_SITE_OTHER): Payer: BLUE CROSS/BLUE SHIELD | Admitting: Family Medicine

## 2014-06-29 VITALS — BP 108/84 | Temp 98.6°F | Ht 69.0 in | Wt 278.0 lb

## 2014-06-29 DIAGNOSIS — I10 Essential (primary) hypertension: Secondary | ICD-10-CM

## 2014-06-29 DIAGNOSIS — R21 Rash and other nonspecific skin eruption: Secondary | ICD-10-CM

## 2014-06-29 NOTE — Progress Notes (Signed)
   Subjective:    Patient ID: [name], female    DOB: 03/24/1976, 39 y.o.   MRN: [id number]  HPI Here to follow up on HTN and for other issues. We recently decided to taper off Metoprolol, but she actually just stopped taking it altogether about 10 days ago. Her BP has remained quite stable since then in the range of 120s or 130s over 80s. She feels better in general. Also she asks to be tested for lupus. Her mother has lupus and [name] has had intermittent red butterfly rashes on her face for several years. She also has Raynauds phenomenon, and she knows these can all be related.    Review of Systems  Constitutional: Negative.   Respiratory: Negative.   Cardiovascular: Negative.   Skin: Positive for rash.  Neurological: Negative.        Objective:   Physical Exam  Constitutional: She appears well-developed and well-nourished.  Cardiovascular: Normal rate, regular rhythm, normal heart sounds and intact distal pulses.   Pulmonary/Chest: Effort normal and breath sounds normal.  Skin:  No rash is visible today           Assessment & Plan:  Her BP is stable right now with taking only HCTZ. She will stay on current regimen and watch her BPs. We will get some labs today to screen for SLE.

## 2014-06-29 NOTE — Progress Notes (Signed)
Pre visit review using our clinic review tool, if applicable. No additional management support is needed unless otherwise documented below in the visit note. 

## 2014-07-02 LAB — ANGIOTENSIN CONVERTING ENZYME: Angiotensin-Converting Enzyme: 1 U/L — ABNORMAL LOW (ref 8–52)

## 2014-07-02 LAB — ANTI-DNA ANTIBODY, DOUBLE-STRANDED: ds DNA Ab: <1 IU/mL

## 2014-07-03 ENCOUNTER — Telehealth: Payer: Self-pay | Admitting: Family Medicine

## 2014-07-03 NOTE — Telephone Encounter (Signed)
emmi emailed °

## 2014-07-13 ENCOUNTER — Telehealth: Payer: Self-pay | Admitting: Family Medicine

## 2014-07-13 ENCOUNTER — Encounter: Payer: Self-pay | Admitting: Family Medicine

## 2014-07-13 ENCOUNTER — Ambulatory Visit (INDEPENDENT_AMBULATORY_CARE_PROVIDER_SITE_OTHER): Payer: BLUE CROSS/BLUE SHIELD | Admitting: Family Medicine

## 2014-07-13 VITALS — BP 130/90 | HR 112 | Temp 98.8°F | Ht 69.0 in | Wt 277.0 lb

## 2014-07-13 DIAGNOSIS — E538 Deficiency of other specified B group vitamins: Secondary | ICD-10-CM

## 2014-07-13 DIAGNOSIS — J0100 Acute maxillary sinusitis, unspecified: Secondary | ICD-10-CM

## 2014-07-13 MED ORDER — CELECOXIB 100 MG PO CAPS: 100.0000 mg | ORAL_CAPSULE | Freq: Two times a day (BID) | ORAL | Status: DC

## 2014-07-13 MED ORDER — FLUCONAZOLE 150 MG PO TABS: ORAL_TABLET | ORAL | Status: DC

## 2014-07-13 MED ORDER — CYANOCOBALAMIN 1000 MCG/ML IJ SOLN
1000.0000 ug | Freq: Once | INTRAMUSCULAR | Status: AC
  Administered 2014-07-13: 1000 ug via INTRAMUSCULAR

## 2014-07-13 MED ORDER — LEVOFLOXACIN 500 MG PO TABS: 500.0000 mg | ORAL_TABLET | Freq: Every day | ORAL | Status: AC

## 2014-07-13 MED ORDER — METHYLPREDNISOLONE ACETATE 80 MG/ML IJ SUSP
120.0000 mg | Freq: Once | INTRAMUSCULAR | Status: AC
  Administered 2014-07-13: 120 mg via INTRAMUSCULAR

## 2014-07-13 NOTE — Telephone Encounter (Signed)
Pt was seen today and would like to know can she take sudafed with all other med that was prescribed

## 2014-07-13 NOTE — Progress Notes (Signed)
   Subjective:    Patient ID: [name], female    DOB: 04/25/1976, 39 y.o.   MRN: [id number]  HPI Here for 3 days of sinus pressure, PND, and a dry cough. Her ears hurt. Using Mucinex.    Review of Systems  Constitutional: Negative.   HENT: Positive for congestion, ear pain, postnasal drip and sinus pressure.   Eyes: Negative.   Respiratory: Positive for cough.        Objective:   Physical Exam  Constitutional: She appears well-developed and well-nourished.  HENT:  Right Ear: External ear normal.  Left Ear: External ear normal.  Nose: Nose normal.  Mouth/Throat: Oropharynx is clear and moist.  Eyes: Conjunctivae are normal.  Pulmonary/Chest: Effort normal and breath sounds normal.  Lymphadenopathy:    She has no cervical adenopathy.          Assessment & Plan:  Given a steroid shot and Levaquin.

## 2014-07-13 NOTE — Addendum Note (Signed)
Addended by: Aniceto BossNIMMONS, Choya Tornow A on: 07/13/2014 02:38 PM   Modules accepted: Orders

## 2014-07-13 NOTE — Progress Notes (Signed)
Pre visit review using our clinic review tool, if applicable. No additional management support is needed unless otherwise documented below in the visit note. 

## 2014-07-13 NOTE — Telephone Encounter (Signed)
Per Dr. Clent RidgesFry, no sudafed and send in Diflucan to dispense # 3 with 5 refills. I did speak with pt and sent script e-scribe.

## 2014-07-25 ENCOUNTER — Encounter: Payer: Self-pay | Admitting: Family Medicine

## 2014-07-25 ENCOUNTER — Ambulatory Visit (INDEPENDENT_AMBULATORY_CARE_PROVIDER_SITE_OTHER): Payer: BLUE CROSS/BLUE SHIELD | Admitting: Family Medicine

## 2014-07-25 VITALS — BP 130/82 | Temp 98.9°F | Ht 69.0 in

## 2014-07-25 DIAGNOSIS — R000 Tachycardia, unspecified: Secondary | ICD-10-CM

## 2014-07-25 DIAGNOSIS — I10 Essential (primary) hypertension: Secondary | ICD-10-CM

## 2014-07-25 NOTE — Progress Notes (Signed)
   Subjective:    Patient ID: Hannah OmanMyranda Ma, female    DOB: 10/15/1975, 39 y.o.   MRN: [id number]  HPI Here to follow up on labile BPs and high heart rates. We have looked at this for the past 6 months or so and it has not improved. We tried her on Metorprolol but she did not tolerate this and it seemed to make things worse. Her labs have been unremarkable. She has chronic back pain and I an sure this adds to the problem.    Review of Systems  Constitutional: Negative.   Respiratory: Negative.   Cardiovascular: Negative.        Objective:   Physical Exam  Constitutional: She is oriented to person, place, and time. She appears well-developed and well-nourished.  Neck: No thyromegaly present.  Cardiovascular: Regular rhythm, normal heart sounds and intact distal pulses.   No murmur heard. Rapid rate   Pulmonary/Chest: Effort normal and breath sounds normal. No respiratory distress. She has no wheezes. She has no rales.  Lymphadenopathy:    She has no cervical adenopathy.  Neurological: She is alert and oriented to person, place, and time.          Assessment & Plan:  We will refer her to Cardiology to evaluate further

## 2014-07-25 NOTE — Progress Notes (Signed)
Pre visit review using our clinic review tool, if applicable. No additional management support is needed unless otherwise documented below in the visit note. 

## 2014-08-21 NOTE — Progress Notes (Signed)
Patient ID: [name], female   DOB: Jun 05, 1975, 39 y.o.   MRN: [id number]     Cardiology Office Note   Date:  08/21/2014   ID:  [name], DOB 17-Jun-1975, MRN [id number]  PCP:  Hannah Morale, MD  Cardiologist:   Hannah Rouge, MD   No chief complaint on file.     History of Present Illness: [name] is a 39 y.o. female who presents for evaluation of labile BP and tachycardia.  On diuretic for HTN and edema.  Tried on beta blocker but felt worse No evaluation of EF.  Labs from 2015 reviewed and normal TSH,T3, T4 not anemic  04/02/14  ESR 47 negative w/u connective tissue disease ANA ACE levels CTA 06/11/13  No PE atelectasis of lungs  Review of scan shows elevated right hemidiaphragm  No coronary calcium and normal aortic root  Depression and anxiety on BZ's and flexeril for fibromyalgia    Issues with BP started over a year ago when her back was bad.  Was on steroids for a long time. Still sedentary from back issues.  Was on metoprolol but felt poorly and lost a lot of hair.  She feels that her HR doesn't go down at night and BP is labile  Was too low when ACe with diuretic.      Past Medical History  Diagnosis Date  . Allergy   . GERD (gastroesophageal reflux disease)   . Polycystic ovaries   . B12 deficiency   . Peripheral neuropathy   . Fibromyalgia   . Depression   . Migraine   . Anxiety   . Intervertebral disc protrusion 01/11/2014    Past Surgical History  Procedure Laterality Date  . Appendectomy    . Vaginal hysterectomy    . Tonsillectomy    . Sinusotomy  12/14/06  . Wrist ganglion excision    . Ovarian cyst removal    . Oophorectomy      left overy  . Rectocele repair    . Bladder tack  2012  . Back surgery  2012    left laminectomy at L3-4 per Dr. Luiz Ochoa   . Lumbar disc surgery  01-31-14    per Dr. Saintclair Halsted      Current Outpatient Prescriptions  Medication Sig Dispense Refill  . ALPRAZolam (XANAX) 0.5 MG tablet Take 1 tablet (0.5 mg total) by  mouth 3 (three) times daily as needed. (Patient taking differently: Take 0.5 mg by mouth 3 (three) times daily as needed for anxiety. ) 90 tablet 5  . calcium carbonate (TUMS - DOSED IN MG ELEMENTAL CALCIUM) 500 MG chewable tablet Chew 1 tablet by mouth 2 (two) times daily as needed for indigestion or heartburn.    . celecoxib (CELEBREX) 100 MG capsule Take 1 capsule (100 mg total) by mouth 2 (two) times daily. 60 capsule 11  . Cholecalciferol (VITAMIN D3) 2000 UNITS TABS Take 1 tablet by mouth daily.    . cyanocobalamin (,VITAMIN B-12,) 1000 MCG/ML injection Inject 1 mL (1,000 mcg total) into the muscle every 7 (seven) days. 12 mL 11  . cyclobenzaprine (FLEXERIL) 10 MG tablet Take 10 mg by mouth every 8 (eight) hours as needed for muscle spasms. PATIENT TAKES FOR MUSCLE SPASMS    . diazepam (VALIUM) 5 MG tablet Take 1 tablet (5 mg total) by mouth every 8 (eight) hours as needed for muscle spasms. 60 tablet 5  . diphenhydrAMINE (BENADRYL) 25 MG tablet Take 1 tablet (25 mg total) by mouth once. (Patient  not taking: Reported on 07/13/2014) 10 tablet 0  . docusate sodium 100 MG CAPS Take 100 mg by mouth 2 (two) times daily. 10 capsule 0  . fluconazole (DIFLUCAN) 150 MG tablet TAKE AS needed (Patient not taking: Reported on 07/25/2014) 3 tablet 5  . fluticasone (FLONASE) 50 MCG/ACT nasal spray Place 2 sprays into both nostrils daily.    Marland Kitchen gabapentin (NEURONTIN) 300 MG capsule Take 1 capsule (300 mg total) by mouth 3 (three) times daily. 90 capsule 5  . hydrochlorothiazide (HYDRODIURIL) 25 MG tablet TAKE 1 TABLET BY MOUTH EVERY DAY 90 tablet 3  . metoCLOPramide (REGLAN) 10 MG tablet Take 1 tablet (10 mg total) by mouth once. (Patient not taking: Reported on 07/13/2014) 20 tablet 0  . Multiple Vitamins-Minerals (MULTI-VITAMIN GUMMIES PO) Take 2 tablets by mouth daily.     . naproxen (NAPROSYN) 500 MG tablet Take 1 tablet (500 mg total) by mouth once. (Patient not taking: Reported on 07/13/2014) 10 tablet 0  .  nortriptyline (PAMELOR) 50 MG capsule Take 1 capsule (50 mg total) by mouth at bedtime. 30 capsule 11  . oxyCODONE-acetaminophen (PERCOCET) 10-325 MG per tablet Take 1 tablet by mouth every 6 (six) hours as needed for pain. 60 tablet 0  . pantoprazole (PROTONIX) 40 MG tablet Take 1 tablet (40 mg total) by mouth 2 (two) times daily. 180 tablet 3  . promethazine (PHENERGAN) 25 MG tablet Take 1 tablet (25 mg total) by mouth every 6 (six) hours as needed for nausea. PATIENT TAKES FOR NAUSEA (Patient not taking: Reported on 07/13/2014) 90 tablet 5  . simethicone (MYLICON) 80 MG chewable tablet Chew 160 mg by mouth every 6 (six) hours as needed (gas pain).    . sucralfate (CARAFATE) 1 GM/10ML suspension Take 10 mLs (1 g total) by mouth 4 (four) times daily -  with meals and at bedtime. 420 mL 1  . SUMAtriptan (IMITREX) 50 MG tablet TAKE 1 TABLET BY MOUTH AT ONSET OF HEADACHE MAY REPEAT ONCE IN 2 HOURS IF PERSISTS OR RECURS 9 tablet 2   No current facility-administered medications for this visit.    Allergies:   Gadolinium derivatives    Social History:  The patient  reports that she quit smoking about 16 years ago. She has never used smokeless tobacco. She reports that she does not drink alcohol or use illicit drugs.   Family History:  The patient's family history includes Cirrhosis in her mother; Diabetes in her father and mother; Fibromyalgia in her mother; Heart attack in her father; Heart disease in her father; Hypertension in her father; Liver disease in her mother; Neuropathy in her mother; Prostate cancer in her father; Pulmonary embolism in her mother; Thyroid cancer in her mother. There is no history of Colon cancer.    ROS:  Please see the history of present illness.   Otherwise, review of systems are positive for none.   All other systems are reviewed and negative.    PHYSICAL EXAM: VS:  LMP 12/13/1999 , BMI There is no weight on file to calculate BMI. Obese white female  HEENT:  normal Neck: no JVD, carotid bruits, or masses Cardiac:  RRR; no murmurs, rubs, or gallops,no edema  Respiratory:  clear to auscultation bilaterally, normal work of breathing GI: soft, nontender, nondistended, + BS MS: no deformity or atrophy Skin: warm and dry, no rash Neuro:  Strength and sensation are intact Psych: euthymic mood, full affect   EKG:  05/13/14 SR rate 86 normal  Recent Labs: 04/02/2014: TSH 1.06 05/11/2014: ALT 16; BUN 17; Creatinine 0.90; Hemoglobin 14.8; Platelets 371; Potassium 3.8; Sodium 140    Lipid Panel    Component Value Date/Time   CHOL 236* 02/22/2013 1159   TRIG 180.0* 02/22/2013 1159   HDL 34.40* 02/22/2013 1159   CHOLHDL 7 02/22/2013 1159   VLDL 36.0 02/22/2013 1159   LDLDIRECT 175.0 02/22/2013 1159      Wt Readings from Last 3 Encounters:  07/13/14 277 lb (125.646 kg)  06/29/14 278 lb (126.1 kg)  05/21/14 278 lb (126.1 kg)      Other studies Reviewed: Additional studies/ records that were reviewed today include: Epic records and labs see data in HPI.    ASSESSMENT AND PLAN:  1.  HTN:  Etiology not clear.  Will get 24 hr BP monitor to see if she has normal circadian variation.  With tachycardia need to r/o pheo/paraganglioma   Will get 24 hr urine metanephrines.  At some point may need Abdominal CT and renal duplex 2. Tachycardia- she has anxiety and is overweight but HR seems disproportionately high  Check echo to assess RV/LV EF r/o coarctation   Current medicines are reviewed at length with the patient today.  The patient does not have concerns regarding medicines.  The following changes have been made:  no change  Labs/ tests ordered today include:  Echo Urine for metanephrines 24 hr BP monitor   No orders of the defined types were placed in this encounter.     Disposition:   FU with after tests next available      Signed, Hannah Rouge, MD  08/21/2014 7:23 PM    [facility] [address],  [place], [place]  [address] Phone: [phone]; Fax: [phone]

## 2014-08-22 ENCOUNTER — Ambulatory Visit (INDEPENDENT_AMBULATORY_CARE_PROVIDER_SITE_OTHER): Payer: BLUE CROSS/BLUE SHIELD | Admitting: Cardiovascular Disease

## 2014-08-22 ENCOUNTER — Encounter: Payer: Self-pay | Admitting: Cardiovascular Disease

## 2014-08-22 VITALS — BP 163/108 | HR 117 | Ht 69.0 in | Wt 273.0 lb

## 2014-08-22 DIAGNOSIS — R000 Tachycardia, unspecified: Secondary | ICD-10-CM

## 2014-08-22 DIAGNOSIS — I159 Secondary hypertension, unspecified: Secondary | ICD-10-CM | POA: Diagnosis not present

## 2014-08-22 NOTE — Patient Instructions (Signed)
Your physician recommends that you schedule a follow-up appointment in: NEXT  AVAILABLE  WITH   DR Eden EmmsNISHAN  AFTER  TESTS ARE  DONE   Your physician recommends that you continue on your current medications as directed. Please refer to the Current Medication list given to you today.  Your physician recommends that you return for lab work in:  24 HOUR  URINE  METANEPHRINES  Your physician has requested that you have an echocardiogram. Echocardiography is a painless test that uses sound waves to create images of your heart. It provides your doctor with information about the size and shape of your heart and how well your heart's chambers and valves are working. This procedure takes approximately one hour. There are no restrictions for this procedure.    Your physician has requested that you regularly monitor and record your blood pressure readings at home. Please use the same machine at the same time of day to check your readings and record them to bring to your follow-up visit. /B/P  MONITORING

## 2014-08-24 ENCOUNTER — Other Ambulatory Visit (INDEPENDENT_AMBULATORY_CARE_PROVIDER_SITE_OTHER): Payer: BLUE CROSS/BLUE SHIELD | Admitting: *Deleted

## 2014-08-24 DIAGNOSIS — I10 Essential (primary) hypertension: Secondary | ICD-10-CM | POA: Diagnosis not present

## 2014-08-24 NOTE — Addendum Note (Signed)
Addended by: Keval Nam K on: 08/24/2014 01:06 PM   Modules accepted: Orders  

## 2014-08-24 NOTE — Addendum Note (Signed)
Addended by: Tonita PhoenixBOWDEN, ROBIN K on: 08/24/2014 01:06 PM   Modules accepted: Orders

## 2014-08-28 LAB — METANEPHRINES, URINE, 24 HOUR
Metaneph Total, Ur: 842 mcg/24 h — ABNORMAL HIGH (ref 182–739)
Metanephrines, Ur: 181 mcg/24 h (ref 58–203)
Normetanephrine, 24H Ur: 661 mcg/24 h — ABNORMAL HIGH (ref 88–649)

## 2014-08-29 ENCOUNTER — Encounter: Payer: Self-pay | Admitting: *Deleted

## 2014-08-29 ENCOUNTER — Encounter: Payer: Self-pay | Admitting: Cardiovascular Disease

## 2014-08-29 ENCOUNTER — Ambulatory Visit (HOSPITAL_COMMUNITY): Payer: BLUE CROSS/BLUE SHIELD | Attending: Cardiovascular Disease

## 2014-08-29 ENCOUNTER — Encounter (INDEPENDENT_AMBULATORY_CARE_PROVIDER_SITE_OTHER): Payer: BLUE CROSS/BLUE SHIELD

## 2014-08-29 DIAGNOSIS — I159 Secondary hypertension, unspecified: Secondary | ICD-10-CM | POA: Diagnosis not present

## 2014-08-29 DIAGNOSIS — R000 Tachycardia, unspecified: Secondary | ICD-10-CM | POA: Diagnosis not present

## 2014-08-29 NOTE — Progress Notes (Signed)
Patient ID: [name], female   DOB: 12/02/1975, 39 y.o.   MRN: [id number] 24 hour ambulatory blood pressure monitor applied to patient.

## 2014-08-29 NOTE — Progress Notes (Signed)
2D Echo completed. 08/29/2014 

## 2014-08-31 ENCOUNTER — Encounter: Payer: Self-pay | Admitting: Cardiovascular Disease

## 2014-09-03 ENCOUNTER — Telehealth: Payer: Self-pay | Admitting: *Deleted

## 2014-09-03 DIAGNOSIS — R7989 Other specified abnormal findings of blood chemistry: Secondary | ICD-10-CM

## 2014-09-03 DIAGNOSIS — I159 Secondary hypertension, unspecified: Secondary | ICD-10-CM

## 2014-09-03 NOTE — Telephone Encounter (Signed)
PT  AWARE OF  ECHO RESULTS  AS WELL AS  24 HOUR URINE DISCUSSED  WITH  LISA  IN  CT  NEED TO ORDER  WITH AND WITHOUT  CONTRAST  OF  ABD .

## 2014-09-03 NOTE — Telephone Encounter (Signed)
-----   Message from Wendall StadePeter C Nishan, MD sent at 08/30/2014  7:43 PM EDT ----- Needs abdominal CT of adrenals to r/o pheochromocytoma  Can ask tech Misty StanleyLisa if scan needs to be with contrast or not

## 2014-09-05 ENCOUNTER — Other Ambulatory Visit: Payer: Self-pay | Admitting: *Deleted

## 2014-09-05 NOTE — Progress Notes (Signed)
HAS  BEEN  DONE  ABD CT  SCHEDULED ./CY

## 2014-09-05 NOTE — Telephone Encounter (Signed)
Call in #90 with 5 rf 

## 2014-09-06 ENCOUNTER — Other Ambulatory Visit: Payer: Self-pay | Admitting: Cardiovascular Disease

## 2014-09-06 ENCOUNTER — Telehealth: Payer: Self-pay | Admitting: *Deleted

## 2014-09-06 ENCOUNTER — Ambulatory Visit (INDEPENDENT_AMBULATORY_CARE_PROVIDER_SITE_OTHER)
Admission: RE | Admit: 2014-09-06 | Discharge: 2014-09-06 | Disposition: A | Discharge status: Home / Self Care | Payer: BLUE CROSS/BLUE SHIELD | Source: Ambulatory Visit | Attending: Cardiovascular Disease | Admitting: Cardiovascular Disease

## 2014-09-06 DIAGNOSIS — R799 Abnormal finding of blood chemistry, unspecified: Secondary | ICD-10-CM | POA: Diagnosis not present

## 2014-09-06 DIAGNOSIS — I159 Secondary hypertension, unspecified: Secondary | ICD-10-CM

## 2014-09-06 DIAGNOSIS — R7989 Other specified abnormal findings of blood chemistry: Secondary | ICD-10-CM

## 2014-09-06 MED ORDER — ALPRAZOLAM 0.5 MG PO TABS: 0.5000 mg | ORAL_TABLET | Freq: Three times a day (TID) | ORAL | Status: DC | PRN

## 2014-09-06 NOTE — Telephone Encounter (Signed)
rx called in

## 2014-09-06 NOTE — Telephone Encounter (Signed)
PT  AWARE OF B/P REPORT  PER  DR NISHAN  OVERALL B/P OK  HR  ELEVATED  INTOL  TO BETA  BLOCKER IN PAST .Zack Seal/CY

## 2014-09-14 NOTE — Progress Notes (Addendum)
Patient ID: [name], female   DOB: 11-Nov-1975, 39 y.o.   MRN: [id number]     Cardiology Office Note   Date:  09/14/2014   ID:  [name], DOB 28-Oct-1975, MRN [id number]  PCP:  Laurey Morale, MD  Cardiologist:   Jenkins Rouge, MD   No chief complaint on file.     History of Present Illness: [name] is a 39 y.o. female who presents for evaluation of labile BP and tachycardia.  On diuretic for HTN and edema.  Tried on beta blocker but felt worse No evaluation of EF.  Labs from 2015 reviewed and normal TSH,T3, T4 not anemic  04/02/14  ESR 47 negative w/u connective tissue disease ANA ACE levels CTA 06/11/13  No PE atelectasis of lungs  Review of scan shows elevated right hemidiaphragm  No coronary calcium and normal aortic root  Depression and anxiety on BZ's and flexeril for fibromyalgia    Issues with BP started over a year ago when her back was bad.  Was on steroids for a long time. Still sedentary from back issues.  Was on metoprolol but felt poorly and lost a lot of hair.  She feels that her HR doesn't go down at night and BP is labile  Was too low when ACe with diuretic.   F/U urine metanephrines were minimally elevated  CT with no adrenal abnormalities   Echo:  EF normal 60-65%  Mild LAE    BP monitor reviewed average BP 13/85  Average HR 97    Discussed findings with her  Her BP is really not an issue making Pheo less likely in conjunction with negative CT.  She indicates hair following out and ineffectiveness of bystolic and metoprolol.    I discussed possible role of Corlanor and referral to EP  In mean time will try atenolol as alternative BB   Past Medical History  Diagnosis Date  . Allergy   . GERD (gastroesophageal reflux disease)   . Polycystic ovaries   . B12 deficiency   . Peripheral neuropathy   . Fibromyalgia   . Depression   . Migraine   . Anxiety   . Intervertebral disc protrusion 01/11/2014    Past Surgical History  Procedure Laterality  Date  . Appendectomy    . Vaginal hysterectomy    . Tonsillectomy    . Sinusotomy  12/14/06  . Wrist ganglion excision    . Ovarian cyst removal    . Oophorectomy      left overy  . Rectocele repair    . Bladder tack  2012  . Back surgery  2012    left laminectomy at L3-4 per Dr. Luiz Ochoa   . Lumbar disc surgery  01-31-14    per Dr. Saintclair Halsted      Current Outpatient Prescriptions  Medication Sig Dispense Refill  . ALPRAZolam (XANAX) 0.5 MG tablet Take 1 tablet (0.5 mg total) by mouth 3 (three) times daily as needed. 90 tablet 5  . calcium carbonate (TUMS - DOSED IN MG ELEMENTAL CALCIUM) 500 MG chewable tablet Chew 1 tablet by mouth 2 (two) times daily as needed for indigestion or heartburn.    . celecoxib (CELEBREX) 100 MG capsule Take 1 capsule (100 mg total) by mouth 2 (two) times daily. 60 capsule 11  . Cholecalciferol (VITAMIN D3) 2000 UNITS TABS Take 1 tablet by mouth daily.    . cyanocobalamin (,VITAMIN B-12,) 1000 MCG/ML injection Inject 1 mL (1,000 mcg total) into the muscle every 7 (seven)  days. 12 mL 11  . cyclobenzaprine (FLEXERIL) 10 MG tablet Take 10 mg by mouth every 8 (eight) hours as needed for muscle spasms. PATIENT TAKES FOR MUSCLE SPASMS    . diazepam (VALIUM) 5 MG tablet Take 1 tablet (5 mg total) by mouth every 8 (eight) hours as needed for muscle spasms. (Patient not taking: Reported on 08/22/2014) 60 tablet 5  . diphenhydrAMINE (BENADRYL) 25 MG tablet Take 1 tablet (25 mg total) by mouth once. (Patient not taking: Reported on 07/13/2014) 10 tablet 0  . docusate sodium 100 MG CAPS Take 100 mg by mouth 2 (two) times daily. (Patient not taking: Reported on 08/22/2014) 10 capsule 0  . fluticasone (FLONASE) 50 MCG/ACT nasal spray Place 2 sprays into both nostrils daily.    Marland Kitchen gabapentin (NEURONTIN) 300 MG capsule Take 1 capsule (300 mg total) by mouth 3 (three) times daily. 90 capsule 5  . hydrochlorothiazide (HYDRODIURIL) 25 MG tablet TAKE 1 TABLET BY MOUTH EVERY DAY 90 tablet 3    . metoCLOPramide (REGLAN) 10 MG tablet Take 1 tablet (10 mg total) by mouth once. 20 tablet 0  . Multiple Vitamins-Minerals (MULTI-VITAMIN GUMMIES PO) Take 2 tablets by mouth daily.     . naproxen (NAPROSYN) 500 MG tablet Take 1 tablet (500 mg total) by mouth once. (Patient not taking: Reported on 07/13/2014) 10 tablet 0  . nortriptyline (PAMELOR) 50 MG capsule Take 1 capsule (50 mg total) by mouth at bedtime. 30 capsule 11  . oxyCODONE-acetaminophen (PERCOCET) 10-325 MG per tablet Take 1 tablet by mouth every 6 (six) hours as needed for pain. (Patient not taking: Reported on 08/22/2014) 60 tablet 0  . pantoprazole (PROTONIX) 40 MG tablet Take 1 tablet (40 mg total) by mouth 2 (two) times daily. 180 tablet 3  . promethazine (PHENERGAN) 25 MG tablet Take 1 tablet (25 mg total) by mouth every 6 (six) hours as needed for nausea. PATIENT TAKES FOR NAUSEA (Patient not taking: Reported on 07/13/2014) 90 tablet 5  . simethicone (MYLICON) 80 MG chewable tablet Chew 160 mg by mouth every 6 (six) hours as needed (gas pain).    . sucralfate (CARAFATE) 1 GM/10ML suspension Take 10 mLs (1 g total) by mouth 4 (four) times daily -  with meals and at bedtime. 420 mL 1  . SUMAtriptan (IMITREX) 50 MG tablet TAKE 1 TABLET BY MOUTH AT ONSET OF HEADACHE MAY REPEAT ONCE IN 2 HOURS IF PERSISTS OR RECURS 9 tablet 2   No current facility-administered medications for this visit.    Allergies:   Gadolinium derivatives    Social History:  The patient  reports that she quit smoking about 16 years ago. She has never used smokeless tobacco. She reports that she does not drink alcohol or use illicit drugs.   Family History:  The patient's family history includes Cirrhosis in her mother; Diabetes in her father and mother; Fibromyalgia in her mother; Heart attack in her father; Heart disease in her father; Hypertension in her father; Liver disease in her mother; Neuropathy in her mother; Prostate cancer in her father; Pulmonary  embolism in her mother; Thyroid cancer in her mother. There is no history of Colon cancer.    ROS:  Please see the history of present illness.   Otherwise, review of systems are positive for none.   All other systems are reviewed and negative.    PHYSICAL EXAM: VS:  LMP 12/13/1999 , BMI There is no weight on file to calculate BMI. Obese white female  HEENT: normal Neck: no JVD, carotid bruits, or masses Cardiac:  RRR; no murmurs, rubs, or gallops,no edema  Respiratory:  clear to auscultation bilaterally, normal work of breathing GI: soft, nontender, nondistended, + BS MS: no deformity or atrophy Skin: warm and dry, no rash Neuro:  Strength and sensation are intact Psych: euthymic mood, full affect   EKG:  05/13/14 SR rate 86 normal   09/17/14  Sinus tachycardia rate 123  Otherwise normal ECG    Recent Labs: 04/02/2014: TSH 1.06 05/11/2014: ALT 16; BUN 17; Creatinine 0.90; Hemoglobin 14.8; Platelets 371; Potassium 3.8; Sodium 140    Lipid Panel    Component Value Date/Time   CHOL 236* 02/22/2013 1159   TRIG 180.0* 02/22/2013 1159   HDL 34.40* 02/22/2013 1159   CHOLHDL 7 02/22/2013 1159   VLDL 36.0 02/22/2013 1159   LDLDIRECT 175.0 02/22/2013 1159      Wt Readings from Last 3 Encounters:  08/22/14 273 lb (123.832 kg)  07/13/14 277 lb (125.646 kg)  06/29/14 278 lb (126.1 kg)      Other studies Reviewed: Additional studies/ records that were reviewed today include: Epic records and labs see data in HPI.    ASSESSMENT AND PLAN:  1.  HTN:  Doubt pheo echo ok with no LVH average BP on monitor 130/85  Continue diuretic 2. IST:  Start atenolol  Refer to EP  I think if affordable Corlanor 5 bid would be best option No structural Heart disease by echo Lack of normal drop in night time HR  Although average HR on monitor only 90's Indicating some supratentorial component to sinus tachycardia and relation to chronic back pain   Current medicines are reviewed at length with  the patient today.  The patient does not have concerns regarding medicines.  The following changes have been made: Atenolol 50 mg   Labs/ tests none   No orders of the defined types were placed in this encounter.     Disposition:   FU with EP next available     Signed, Jenkins Rouge, MD  09/14/2014 12:15 PM    [facility] [address], [place], [place]  [address] Phone: [phone]; Fax: [phone]

## 2014-09-17 ENCOUNTER — Encounter: Payer: Self-pay | Admitting: Cardiovascular Disease

## 2014-09-17 ENCOUNTER — Ambulatory Visit (INDEPENDENT_AMBULATORY_CARE_PROVIDER_SITE_OTHER): Payer: BLUE CROSS/BLUE SHIELD | Admitting: Cardiovascular Disease

## 2014-09-17 VITALS — BP 146/120 | HR 130 | Ht 69.0 in | Wt 273.1 lb

## 2014-09-17 DIAGNOSIS — R000 Tachycardia, unspecified: Secondary | ICD-10-CM | POA: Diagnosis not present

## 2014-09-17 MED ORDER — ATENOLOL 50 MG PO TABS: 50.0000 mg | ORAL_TABLET | Freq: Every day | ORAL | Status: DC

## 2014-09-17 NOTE — Progress Notes (Signed)
BOTH TESTS  WERE IN    EPIC  HARD  TO LOCATE .Zack Seal/CY

## 2014-09-17 NOTE — Patient Instructions (Signed)
Medication Instructions:  START ATENOLOL  50 MG   EVERY DAY   Labwork: NONE  Testing/Procedures:NONE  Follow-Up: You have been referred to EP  NEXT  AVAILABLE  FOR INAPPROPIATE SINUS  TACHY 6 MONTHS  WITH DR Eden EmmsNISHAN  Any Other Special Instructions Will Be Listed Below (If Applicable).

## 2014-10-04 ENCOUNTER — Ambulatory Visit (INDEPENDENT_AMBULATORY_CARE_PROVIDER_SITE_OTHER): Payer: BLUE CROSS/BLUE SHIELD | Admitting: Internal Medicine

## 2014-10-04 ENCOUNTER — Encounter: Payer: Self-pay | Admitting: Internal Medicine

## 2014-10-04 VITALS — BP 134/99 | HR 99 | Ht 69.0 in | Wt 269.0 lb

## 2014-10-04 DIAGNOSIS — Z79899 Other long term (current) drug therapy: Secondary | ICD-10-CM

## 2014-10-04 DIAGNOSIS — R000 Tachycardia, unspecified: Secondary | ICD-10-CM | POA: Diagnosis not present

## 2014-10-04 MED ORDER — SPIRONOLACTONE 25 MG PO TABS: 12.5000 mg | ORAL_TABLET | Freq: Every day | ORAL | Status: DC

## 2014-10-04 NOTE — Progress Notes (Signed)
ELECTROPHYSIOLOGY CONSULT NOTE  Patient ID: [name], MRN: [id number], DOB/AGE: 09/06/1975 39 y.o. Admit date: (Not on file) Date of Consult: 10/04/2014  Primary Physician: [name], MD Primary Cardiologist: PN   Chief Complaint: tachycardia   HPI [name] is a 39 y.o. female  Referred because of persistence in sinus tachycardia in the context of hypertension.  This came to her attention last summer following the development of back pain treated with steroids for about 4 months; she was ultimately found to have a fractured disc and underwent surgery. In the wake of that, these abnormalities were noted. She was wearing a FitBit and an apple watch and was then aware of her heart rate.   She's noted some problems with increased heart rates at night. This may be because of her monitoring technology. Reviewing all the office notes and ED notes which I had access back to 2008, there is only one or the heart rate was listed below 90 bpm. Over the last few years there've been multiple recordings of heart rates in the 120 and 30s as well as blood pressures in the 140-150 range. She notes that she has never had her blood pressure treated.   She has a history of labile blood pressure. She was treated with diuretics and  beta blockers.  More remotely, i.e. 3 or 4 years ago, she was on spironolactone. She is not aware why she was prescribed at her why it was discontinued.  Metoprolol ultimately was not tolerated because of alopecia.   Some  combination with a diuretic was associated with some degree of hypotension.  Because of the aforementioned abnormalities. Urine metanephrines which were minimally elevated; a CT was negative for renal abnormalities.  Echocardiogram demonstrated normal left ventricular function and left atrial enlargement.  24 ambulatory blood pressure monitor demonstrated a pressure 130/90 range her heart rate also in the 90-95 range.  She was put on atenolol.  She has tolerated thus far. She has had some issues with bradycardia with detected heart rates of 40 in the evening associated with dizziness. Her resting heart rates have been in the 90s instead of the 130s. She has felt some better.  She's been noted to have an elevated sedimentation rate with a negative workup for connective tissue disease.  Past Medical History  Diagnosis Date  . Allergy   . GERD (gastroesophageal reflux disease)   . Polycystic ovaries   . B12 deficiency   . Peripheral neuropathy   . Fibromyalgia   . Depression   . Migraine   . Anxiety   . Intervertebral disc protrusion 01/11/2014      Surgical History:  Past Surgical History  Procedure Laterality Date  . Appendectomy    . Vaginal hysterectomy    . Tonsillectomy    . Sinusotomy  12/14/06  . Wrist ganglion excision    . Ovarian cyst removal    . Oophorectomy      left overy  . Rectocele repair    . Bladder tack  2012  . Back surgery  2012    left laminectomy at L3-4 per Dr. Phoebe Perch   . Lumbar disc surgery  01-31-14    per Dr. [name]      Home Meds: Prior to Admission medications   Medication Sig Start Date End Date Taking? Authorizing Provider  ALPRAZolam [name]) 0.5 MG tablet Take 1 tablet (0.5 mg total) by mouth 3 (three) times daily as needed. 09/06/14  Yes [name], MD  atenolol (  TENORMIN) 50 MG tablet Take 1 tablet (50 mg total) by mouth daily. 09/17/14  Yes Wendall StadePeter C Nishan, MD  calcium carbonate (TUMS - DOSED IN MG ELEMENTAL CALCIUM) 500 MG chewable tablet Chew 1 tablet by mouth 2 (two) times daily as needed for indigestion or heartburn.   Yes Historical Provider, MD  Cholecalciferol (VITAMIN D3) 2000 UNITS TABS Take 1 tablet by mouth daily.   Yes Historical Provider, MD  cyanocobalamin (,VITAMIN B-12,) 1000 MCG/ML injection Inject 1 mL (1,000 mcg total) into the muscle every 7 (seven) days. 10/12/13  Yes Nelwyn SalisburyStephen A Fry, MD  cyclobenzaprine (FLEXERIL) 10 MG tablet Take 10 mg by mouth every 8 (eight)  hours as needed for muscle spasms. PATIENT TAKES FOR MUSCLE SPASMS   Yes Historical Provider, MD  diazepam (VALIUM) 5 MG tablet Take 1 tablet (5 mg total) by mouth every 8 (eight) hours as needed for muscle spasms. 01/11/14  Yes Nelwyn SalisburyStephen A Fry, MD  docusate sodium 100 MG CAPS Take 100 mg by mouth 2 (two) times daily. 01/13/14  Yes Rodolph Bonganiel Thompson V, MD  fluticasone (FLONASE) 50 MCG/ACT nasal spray Place 2 sprays into both nostrils daily.   Yes Historical Provider, MD  gabapentin (NEURONTIN) 300 MG capsule Take 1 capsule (300 mg total) by mouth 3 (three) times daily. 05/22/14  Yes Nelwyn SalisburyStephen A Fry, MD  hydrochlorothiazide (HYDRODIURIL) 25 MG tablet TAKE 1 TABLET BY MOUTH EVERY DAY 02/27/14  Yes Nelwyn SalisburyStephen A Fry, MD  Multiple Vitamins-Minerals (MULTI-VITAMIN GUMMIES PO) Take 2 tablets by mouth daily.    Yes Historical Provider, MD  nortriptyline (PAMELOR) 50 MG capsule Take 1 capsule (50 mg total) by mouth at bedtime. 12/11/13  Yes Adam Mliss Fritz Jaffe, DO  oxyCODONE-acetaminophen (PERCOCET) 10-325 MG per tablet Take 1 tablet by mouth every 6 (six) hours as needed for pain. 11/30/13  Yes Nelwyn SalisburyStephen A Fry, MD  pantoprazole (PROTONIX) 40 MG tablet Take 1 tablet (40 mg total) by mouth 2 (two) times daily. 10/12/13  Yes Nelwyn SalisburyStephen A Fry, MD  promethazine (PHENERGAN) 25 MG tablet Take 1 tablet (25 mg total) by mouth every 6 (six) hours as needed for nausea. PATIENT TAKES FOR NAUSEA 10/12/13  Yes Nelwyn SalisburyStephen A Fry, MD  simethicone (MYLICON) 80 MG chewable tablet Chew 160 mg by mouth every 6 (six) hours as needed (gas pain).   Yes Historical Provider, MD  sucralfate (CARAFATE) 1 GM/10ML suspension Take 10 mLs (1 g total) by mouth 4 (four) times daily -  with meals and at bedtime. 05/17/14  Yes Amy S Esterwood, PA-C  SUMAtriptan (IMITREX) 50 MG tablet TAKE 1 TABLET BY MOUTH AT ONSET OF HEADACHE MAY REPEAT ONCE IN 2 HOURS IF PERSISTS OR RECURS 06/25/14  Yes Drema DallasAdam R Jaffe, DO  naproxen (NAPROSYN) 500 MG tablet Take 1 tablet (500 mg total) by mouth  once. Patient not taking: Reported on 10/04/2014 05/12/14   Rolland PorterMark James, MD      Allergies:  Allergies  Allergen Reactions  . Gadolinium Derivatives Swelling and Other (See Comments)    Arm swelling, tingling, & redness. Radiologist Dr. Grace IsaacWatts recommends an injection of steroids if patient needs a gadolinium based contrast in the future.    History   Social History  . Marital Status: Married    Spouse Name: N/A  . Number of Children: 2  . Years of Education: N/A   Occupational History  . housewife    Social History Main Topics  . Smoking status: Former Smoker    Quit date: 06/01/1998  .  Smokeless tobacco: Never Used  . Alcohol Use: No  . Drug Use: No  . Sexual Activity: Yes    Birth Control/ Protection: Surgical   Other Topics Concern  . Not on file   Social History Narrative     Family History  Problem Relation Age of Onset  . Fibromyalgia Mother   . Diabetes Mother   . Thyroid cancer Mother   . Liver disease Mother   . Neuropathy Mother   . Hypertension Father   . Diabetes Father   . Heart disease Father   . Colon cancer Neg Hx   . Prostate cancer Father   . Cirrhosis Mother     non-alcoholic  . Pulmonary embolism Mother     both lungs  . Heart attack Father     x 2     ROS:  Please see the history of present illness.     All other systems reviewed and negative.    Physical Exam:   Blood pressure 134/99, pulse 99, height  (1.753 m), weight 269 lb (122.018 kg), last menstrual period 12/13/1999. General: Well developed, Morbidly obese   female in no acute distress. Head: Normocephalic, atraumatic, sclera non-icteric, no xanthomas, nares are without discharge. EENT: normal Lymph Nodes:  none Back: without scoliosis/kyphosis  no CVA tendersness Neck: Negative for carotid bruits. JVD not elevated. Lungs: Clear bilaterally to auscultation without wheezes, rales, or rhonchi. Breathing is unlabored. Heart: RRR with S1 S2. No murmur , rubs, or gallops  appreciated. Abdomen: Soft, non-tender, non-distended with normoactive bowel sounds. No hepatomegaly. No rebound/guarding. No obvious abdominal masses. Msk:  Strength and tone appear normal for age. Extremities: No clubbing or cyanosis. No edema.  Distal pedal pulses are 2+ and equal bilaterally. Skin: Warm and Dry Neuro: Alert and oriented X 3. CN III-XII intact Grossly normal sensory and motor function . Psych:  Responds to questions appropriately with a normal affect.      Labs: Cardiac Enzymes No results for input(s): CKTOTAL, CKMB, TROPONINI in the last 72 hours. CBC Lab Results  Component Value Date   WBC 9.3 05/11/2014   HGB 14.8 05/11/2014   HCT 43.9 05/11/2014   MCV 88.3 05/11/2014   PLT 371 05/11/2014   PROTIME: No results for input(s): LABPROT, INR in the last 72 hours. Chemistry No results for input(s): NA, K, CL, CO2, BUN, CREATININE, CALCIUM, PROT, BILITOT, ALKPHOS, ALT, AST, GLUCOSE in the last 168 hours.  Invalid input(s): LABALBU Lipids Lab Results  Component Value Date   CHOL 236* 02/22/2013   HDL 34.40* 02/22/2013   TRIG 180.0* 02/22/2013   BNP No results found for: PROBNP Thyroid Function Tests: No results for input(s): TSH, T4TOTAL, T3FREE, THYROIDAB in the last 72 hours.  Invalid input(s): FREET3    Miscellaneous Lab Results  Component Value Date   DDIMER <0.27 05/11/2014    Radiology/Studies:  Ct Abd Limited W/o Cm  Oct 04, 2014   CLINICAL DATA:  Hypertension, elevated plasma metanephrines, evaluate for pheochromocytoma  EXAM: CT ABDOMEN WITHOUT CONTRAST  TECHNIQUE: Multidetector CT imaging of the abdomen was performed following the standard protocol without IV contrast.  COMPARISON:  None.  FINDINGS: Motion degraded images.  Lower chest:  Linear scarring in the right lower lobe.  Hepatobiliary: Unenhanced liver is unremarkable.  Gallbladder is unremarkable. No intrahepatic or extrahepatic ductal dilatation.  Pancreas: Within normal limits.   Spleen: Within normal limits.  Adrenals/Urinary Tract: Adrenal glands are within normal limits. No nodule or mass is seen.  Kidneys are  within normal limits. No renal calculi or hydronephrosis.  Stomach/Bowel: Stomach and visualized bowel are unremarkable.  Vascular/Lymphatic: No evidence of abdominal aortic aneurysm.  No suspicious abdominal lymphadenopathy.  Other: No abdominal ascites.  Musculoskeletal: Mild degenerative changes of the visualized thoracolumbar spine.  IMPRESSION: No adrenal nodule or mass is seen.  Negative CT abdomen.  Please note that the pelvis was not imaged.   Electronically Signed   By: Charline BillsSriyesh  Krishnan M.D.   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 Hypokalemia has been noted in the old medical records; however, she attributes this to being on diuretics. That having been said, the only diuretics I saw then were spironolactone.  Workup for  pheochromocytoma has been on illuminating; I don't know of the sensitivity and The specificity of this I will have to defer this to Dr. Fabio BeringNishan's expertise.  I think it's hard to call this inappropriate sinus tachycardia given the concomitant hypertension. Furthermore, the latter complicates the potential use of Ivabradine.  The atenolol has significantly attenuated her blood pressure and her tachycardia. I'm not sure of the mechanism of the "slow heart rate". I suggested that we use AliveCor monitor and we will review this in 4 weeks. For now I suggested she continue her medications.  It is my understanding that alopecia is a class effect of all beta blockers is noted in one. I have warned the patient of this.Sherryl Manges.     Tad Fancher

## 2014-10-04 NOTE — Patient Instructions (Signed)
Medication Instructions:  Your physician has recommended you make the following change in your medication:  1) STOP Hydrochlorothiazide 2) START Aldactone 12.5 mg daily  Labwork: Your physician recommends that you return for lab work in: 2 weeks for a follow up BMET after starting Aldactone  Testing/Procedures: None ordered  Follow-Up: Your physician recommends that you schedule a follow-up appointment in: 4 weeks with Dr. Graciela HusbandsKlein-  On 11/07/14 at 11:45 am   Any Other Special Instructions Will Be Listed Below (If Applicable). Use AliveCor monitor

## 2014-10-18 ENCOUNTER — Other Ambulatory Visit: Payer: BLUE CROSS/BLUE SHIELD

## 2014-10-22 ENCOUNTER — Other Ambulatory Visit: Payer: Self-pay | Admitting: Family Medicine

## 2014-10-22 ENCOUNTER — Other Ambulatory Visit (INDEPENDENT_AMBULATORY_CARE_PROVIDER_SITE_OTHER): Payer: BLUE CROSS/BLUE SHIELD | Admitting: *Deleted

## 2014-10-22 DIAGNOSIS — Z79899 Other long term (current) drug therapy: Secondary | ICD-10-CM | POA: Diagnosis not present

## 2014-10-22 NOTE — Addendum Note (Signed)
Addended by: Tonita PhoenixBOWDEN, ROBIN K on: 10/22/2014 04:13 PM   Modules accepted: Orders

## 2014-10-23 ENCOUNTER — Telehealth: Payer: Self-pay | Admitting: Cardiovascular Disease

## 2014-10-23 LAB — BASIC METABOLIC PANEL
BUN: 13 mg/dL (ref 6–23)
CO2: 33 mEq/L — ABNORMAL HIGH (ref 19–32)
Calcium: 8.9 mg/dL (ref 8.4–10.5)
Chloride: 100 mEq/L (ref 96–112)
Creatinine, Ser: 0.8 mg/dL (ref 0.40–1.20)
GFR: 84.31 mL/min (ref >60.00)
Glucose, Bld: 93 mg/dL (ref 70–99)
Potassium: 3.8 mEq/L (ref 3.5–5.1)
Sodium: 139 mEq/L (ref 135–145)

## 2014-10-23 NOTE — Telephone Encounter (Signed)
Last B12 level  02/22/2013

## 2014-10-23 NOTE — Telephone Encounter (Signed)
Walk in pt form-pt has questions about meds-Christine back Wednesday (5.25.16)

## 2014-10-23 NOTE — Telephone Encounter (Signed)
Refill for one year 

## 2014-10-24 ENCOUNTER — Telehealth: Payer: Self-pay | Admitting: *Deleted

## 2014-10-24 NOTE — Telephone Encounter (Signed)
Sent to the pharmacy by e-scribe. 

## 2014-10-24 NOTE — Telephone Encounter (Addendum)
SPOKE WITH PT  RE  WALK IN FORM PT   HAD  LABS  DONE ON Monday AND  HAS  QUESTIONS RE   RECENT MED  CHANGE  PER  PT  DR Graciela HusbandsKLEIN RECENTLY DC'D HCTZ  25 MG   AND STARTED  ALDACTONE 12.5 MG   OVER  THE   WEEKEND  PT  WENT  BACK ON  HCTZ  DUE  TO NEW  MED  NOT  WORKING  WANTING  TO  KNOW IF  COULD TRY  TAKING  25 MG   OF  ALDACTONE  WILL FORWARD  TO DR Graciela HusbandsKLEIN  FOR  REVIEW./CY

## 2014-10-27 NOTE — Telephone Encounter (Signed)
Yes that would work  Another option is maxzide which woould give her the option of HCTZ plus potassium sparing maybe try and help her figure out cost

## 2014-10-30 NOTE — Telephone Encounter (Signed)
LM MESSAGE TO CALL BACK  .Zack Seal/CY

## 2014-10-30 NOTE — Telephone Encounter (Signed)
PT  WILL CHECK  ON COST  OF  MAXIDE  WHILE   DISCUSSING   STATED THAT  SINCE  STARTING ATENOLOL  50 MG   FEELS  BREATHING  IS  STRANGE  FEELS  LIKE HAS TO CATCH BREATH  JUST  WHILE  SPEAKING WOULD LIKE TO TRY  AND  TAKE  25 MG  OF  ATENOLOL  AND  SEE IF THAT  HELPS  DISCUSSED WITH   DR Eden EmmsNISHAN  AND  THAT IS OKAY  TO  TRY PER  DR Eden EmmsNISHAN  PT  AWARE .Zack Seal/CY

## 2014-10-31 ENCOUNTER — Telehealth: Payer: Self-pay

## 2014-10-31 NOTE — Telephone Encounter (Signed)
  Patient wants to know if she can keep taking her hydrochlorothiazide, since her labs were normal. Patient has not made the change to Aldactone that was suppose to start at her last office visit.  Patient does not want to change unless she has to. Will forward request to Dr. Graciela HusbandsKlein.

## 2014-10-31 NOTE — Telephone Encounter (Signed)
Patient is asking if she can stay on HCTZ since her lab work came back normal.  She wanted to know if Dr. Graciela HusbandsKlein is ok with this. Patient advised will forward to him to address and that he is not in the office this week.  Patient aware it may be next week before receiving an answer.

## 2014-11-05 NOTE — Telephone Encounter (Signed)
Yes, but we will run the risk of low K again  We can keep close eye on the K

## 2014-11-06 ENCOUNTER — Telehealth: Payer: Self-pay | Admitting: Internal Medicine

## 2014-11-06 NOTE — Telephone Encounter (Signed)
Call Documentation      Silas SacramentoDonna M Teria Khachatryan at 11/06/2014 4:08 PM     Status: Signed       Expand All Collapse All   New message      Returning Katrina Brosh's call

## 2014-11-06 NOTE — Telephone Encounter (Signed)
New message ° ° ° ° °Returning Sherri's call °

## 2014-11-07 ENCOUNTER — Ambulatory Visit (INDEPENDENT_AMBULATORY_CARE_PROVIDER_SITE_OTHER): Payer: BLUE CROSS/BLUE SHIELD | Admitting: Internal Medicine

## 2014-11-07 ENCOUNTER — Encounter: Payer: Self-pay | Admitting: Internal Medicine

## 2014-11-07 VITALS — BP 110/80 | HR 87 | Ht 69.0 in | Wt 272.8 lb

## 2014-11-07 DIAGNOSIS — R000 Tachycardia, unspecified: Secondary | ICD-10-CM | POA: Diagnosis not present

## 2014-11-07 MED ORDER — TRIAMTERENE-HCTZ 37.5-25 MG PO TABS: 1.0000 | ORAL_TABLET | Freq: Every day | ORAL | Status: DC

## 2014-11-07 MED ORDER — VERAPAMIL HCL ER 120 MG PO TBCR: 120.0000 mg | EXTENDED_RELEASE_TABLET | Freq: Every day | ORAL | Status: DC

## 2014-11-07 NOTE — Patient Instructions (Addendum)
Medication Instructions:  Your physician has recommended you make the following change in your medication:  1) STOP Atenolol 2) START Verapamil 120 mg daily 3) STOP Spironolactone 4) START Maxzide - 1 tablet daily  Labwork: None ordered  Testing/Procedures: Your physician has recommended that you have a sleep study. This test records several body functions during sleep, including: brain activity, eye movement, oxygen and carbon dioxide blood levels, heart rate and rhythm, breathing rate and rhythm, the flow of air through your mouth and nose, snoring, body muscle movements, and chest and belly movement.  Follow-Up: Your physician recommends that you schedule a follow-up appointment in: 2 months with Gypsy BalsamAmber Seiler, NP.  Your physician wants you to follow-up in: 6 months with Dr. Graciela HusbandsKlein. You will receive a reminder letter in the mail two months in advance. If you don't receive a letter, please call our office to schedule the follow-up appointment.  Thank you for choosing [facility]!!

## 2014-11-07 NOTE — Telephone Encounter (Signed)
PER PT IS  WAITING IN LOBBY   AT THIS TIME  WILL DISCUSS WITH DR Graciela HusbandsKLEIN  AT TODAY'S OFFICE  VISIT./CY

## 2014-11-07 NOTE — Progress Notes (Signed)
Patient Care Team: Nelwyn SalisburyStephen A Fry, MD as PCP - General   HPI  [name] is a 39 y.o. female Seen in follow-up for tachycardia in the context of hypertension.  She's been poorly tolerant of beta blockers in the past and we tried atenolol instead of metoprolol. It has had a beneficial impact on blood pressure and heart rate; however, it is associated with dyspnea as well as recurrent alopecia.  She is extremely fatigued. She has not had a sleep study.  Evaluation for Pheo positive metanephrines and a negative CT scan Dr. Jamse MeadPN is continuing to work on this. She has trace edema  Past Medical History  Diagnosis Date  . Allergy   . GERD (gastroesophageal reflux disease)   . Polycystic ovaries   . B12 deficiency   . Peripheral neuropathy   . Fibromyalgia   . Depression   . Migraine   . Anxiety   . Intervertebral disc protrusion 01/11/2014    Past Surgical History  Procedure Laterality Date  . Appendectomy    . Vaginal hysterectomy    . Tonsillectomy    . Sinusotomy  12/14/06  . Wrist ganglion excision    . Ovarian cyst removal    . Oophorectomy      left overy  . Rectocele repair    . Bladder tack  2012  . Back surgery  2012    left laminectomy at L3-4 per Dr. Phoebe PerchHirsch   . Lumbar disc surgery  01-31-14    per Dr. Wynetta Emeryram     Current Outpatient Prescriptions  Medication Sig Dispense Refill  . ALPRAZolam (XANAX) 0.5 MG tablet Take 0.5 mg by mouth 3 (three) times daily as needed for anxiety.    Marland Kitchen. atenolol (TENORMIN) 50 MG tablet Take 1 tablet (50 mg total) by mouth daily. 30 tablet 11  . BD INTEGRA SYRINGE 25G X 1" 3 ML MISC USE AS DIRECTED 50 each 1  . calcium carbonate (TUMS - DOSED IN MG ELEMENTAL CALCIUM) 500 MG chewable tablet Chew 1 tablet by mouth 2 (two) times daily as needed for indigestion or heartburn.    . Cholecalciferol (VITAMIN D3) 2000 UNITS TABS Take 1 tablet by mouth daily.    . cyanocobalamin (,VITAMIN B-12,) 1000 MCG/ML injection INJECT 1 ML (1,000  MCG TOTAL) INTO THE MUSCLE EVERY 7 DAYS. 12 mL 3  . cyclobenzaprine (FLEXERIL) 10 MG tablet Take 10 mg by mouth every 8 (eight) hours as needed for muscle spasms. PATIENT TAKES FOR MUSCLE SPASMS    . diazepam (VALIUM) 5 MG tablet Take 1 tablet (5 mg total) by mouth every 8 (eight) hours as needed for muscle spasms. 60 tablet 5  . docusate sodium 100 MG CAPS Take 100 mg by mouth 2 (two) times daily. 10 capsule 0  . fluticasone (FLONASE) 50 MCG/ACT nasal spray Place 2 sprays into both nostrils daily.    Marland Kitchen. gabapentin (NEURONTIN) 300 MG capsule Take 1 capsule (300 mg total) by mouth 3 (three) times daily. 90 capsule 5  . Multiple Vitamins-Minerals (MULTI-VITAMIN GUMMIES PO) Take 2 tablets by mouth daily.     . naproxen (NAPROSYN) 500 MG tablet Take 1 tablet (500 mg total) by mouth once. 10 tablet 0  . nortriptyline (PAMELOR) 50 MG capsule Take 1 capsule (50 mg total) by mouth at bedtime. 30 capsule 11  . oxyCODONE-acetaminophen (PERCOCET) 10-325 MG per tablet Take 1 tablet by mouth every 6 (six) hours as needed for pain. 60 tablet 0  .  pantoprazole (PROTONIX) 40 MG tablet Take 1 tablet (40 mg total) by mouth 2 (two) times daily. 180 tablet 3  . promethazine (PHENERGAN) 25 MG tablet Take 1 tablet (25 mg total) by mouth every 6 (six) hours as needed for nausea. PATIENT TAKES FOR NAUSEA 90 tablet 5  . simethicone (MYLICON) 80 MG chewable tablet Chew 160 mg by mouth every 6 (six) hours as needed (gas pain).    Marland Kitchen spironolactone (ALDACTONE) 25 MG tablet Take 0.5 tablets (12.5 mg total) by mouth daily. 45 tablet 3  . sucralfate (CARAFATE) 1 GM/10ML suspension Take 10 mLs (1 g total) by mouth 4 (four) times daily -  with meals and at bedtime. 420 mL 1  . SUMAtriptan (IMITREX) 50 MG tablet TAKE 1 TABLET BY MOUTH AT ONSET OF HEADACHE MAY REPEAT ONCE IN 2 HOURS IF PERSISTS OR RECURS 9 tablet 2   No current facility-administered medications for this visit.    Allergies  Allergen Reactions  . Gadolinium  Derivatives Swelling and Other (See Comments)    Arm swelling, tingling, & redness. Radiologist Dr. Grace Isaac recommends an injection of steroids if patient needs a gadolinium based contrast in the future.    Review of Systems negative except from HPI and PMH  Physical Exam BP 110/80 mmHg  Pulse 87  Ht  (1.753 m)  Wt 272 lb 12.8 oz (123.741 kg)  BMI 40.27 kg/m2  LMP 12/13/1999 Well developed and well nourished in no acute distress HENT normal E scleral and icterus clear Neck Supple JVP flat; carotids brisk and full Clear to ausculation  Regular rate and rhythm, no murmurs gallops or rub Soft with active bowel sounds No clubbing cyanosis Trace Edema Alert and oriented, grossly normal motor and sensory function Skin Warm and Dry    Assessment and  Plan  Tachycardia  Hypertension   edema  Alopecia The patient's tachycardia is better the blood pressure is better. However,not tolerating BB  Will try verapamil  We will try her on Maxide as she does not tolerate the aldactone  She needs sleep study  Hopefully, the verapamil will attenuate her heart rate. In the event that we need to try something like Ivabradine, I would worry about the impact on blood pressure.

## 2014-11-08 NOTE — Telephone Encounter (Signed)
Patient was seen by Dr. Graciela Husbands yesterday in office

## 2014-11-10 ENCOUNTER — Other Ambulatory Visit: Payer: Self-pay | Admitting: Family Medicine

## 2014-11-16 ENCOUNTER — Ambulatory Visit (INDEPENDENT_AMBULATORY_CARE_PROVIDER_SITE_OTHER): Payer: BLUE CROSS/BLUE SHIELD | Admitting: Family Medicine

## 2014-11-16 ENCOUNTER — Encounter: Payer: Self-pay | Admitting: Family Medicine

## 2014-11-16 VITALS — HR 127 | Temp 98.0°F | Ht 69.0 in | Wt 270.0 lb

## 2014-11-16 DIAGNOSIS — J018 Other acute sinusitis: Secondary | ICD-10-CM | POA: Diagnosis not present

## 2014-11-16 DIAGNOSIS — R000 Tachycardia, unspecified: Secondary | ICD-10-CM

## 2014-11-16 DIAGNOSIS — R7989 Other specified abnormal findings of blood chemistry: Secondary | ICD-10-CM

## 2014-11-16 DIAGNOSIS — R799 Abnormal finding of blood chemistry, unspecified: Secondary | ICD-10-CM

## 2014-11-16 MED ORDER — LEVOFLOXACIN 500 MG PO TABS: 500.0000 mg | ORAL_TABLET | Freq: Every day | ORAL | Status: AC

## 2014-11-16 MED ORDER — FLUCONAZOLE 150 MG PO TABS: 150.0000 mg | ORAL_TABLET | Freq: Once | ORAL | Status: DC

## 2014-11-16 NOTE — Progress Notes (Signed)
   Subjective:    Patient ID: [name], female    DOB: 11/20/1975, 39 y.o.   MRN: [id number]  HPI Here for several things. First for one week she has had sinus pressure, PND, and blowing green mucus from te nose. No cough or fever. Also she asks me to review her recent cardiology visits. She was recently found to hav elevated urine metanephrines and this could possibly play into her resting tachycardia. She had a CT of the adrenals which was normal.   Review of Systems  Constitutional: Negative.   HENT: Positive for congestion, postnasal drip and sinus pressure.   Eyes: Negative.   Respiratory: Negative.   Cardiovascular: Negative.   Endocrine: Negative.        Objective:   Physical Exam  Constitutional: She appears well-developed and well-nourished.  HENT:  Right Ear: External ear normal.  Left Ear: External ear normal.  Nose: Nose normal.  Mouth/Throat: Oropharynx is clear and moist.  Eyes: Conjunctivae are normal.  Neck: No thyromegaly present.  Cardiovascular: Normal rate, regular rhythm, normal heart sounds and intact distal pulses.   Pulmonary/Chest: Effort normal and breath sounds normal.  Lymphadenopathy:    She has no cervical adenopathy.          Assessment & Plan:  Treat the sinusitis with Levaquin. Refer to Endocrine to evaluate the elevated metanephrines.

## 2014-11-16 NOTE — Progress Notes (Signed)
Pre visit review using our clinic review tool, if applicable. No additional management support is needed unless otherwise documented below in the visit note. 

## 2014-11-27 ENCOUNTER — Telehealth: Payer: Self-pay | Admitting: *Deleted

## 2014-11-27 NOTE — Telephone Encounter (Signed)
lmtcb  (need to inform patient of Dr. Odessa FlemingKlein's order for sleep study)

## 2014-11-29 ENCOUNTER — Telehealth: Payer: Self-pay | Admitting: *Deleted

## 2014-11-29 NOTE — Telephone Encounter (Addendum)
Patient reports difficulty having bowel movement since starting Verapamil.  Also complains of swelling in bilateral legs and feet. Reports HR 110, sitting.  States she has not been tracking BPs recently -- instructed to take some today and in morning.  Aware I will call her in morning to get BP numbers. She is aware I will review with Dr. Graciela HusbandsKlein possible alternative medication and call her back.  She is agreeable to plan.

## 2014-11-30 MED ORDER — DILTIAZEM HCL ER COATED BEADS 120 MG PO CP24: 120.0000 mg | ORAL_CAPSULE | Freq: Every day | ORAL | Status: DC

## 2014-11-30 NOTE — Telephone Encounter (Signed)
Advised patient to stop Verapamil.  Start Diltiazem 120 mg daily. Rx sent to [facility] per request.

## 2014-12-10 ENCOUNTER — Encounter: Payer: Self-pay | Admitting: Endocrinology

## 2014-12-10 ENCOUNTER — Telehealth: Payer: Self-pay | Admitting: Internal Medicine

## 2014-12-10 NOTE — Telephone Encounter (Signed)
Reports feeling "puffy". States switched to Diltiazem and now swelling worse.  Hands and feet swollen, pedal edema worse that last week. States muscles in my bones starting hurting after starting Diltiazem. She has not started ordered Maxzide because she only wanted to start one new medication at a time secondary to side effects. Will review with DOD tomorrow to see recommendations as Dr. Graciela HusbandsKlein is not back in office until next week. Patient verbalized understanding and agreeable to plan.

## 2014-12-10 NOTE — Telephone Encounter (Signed)
New message    Patient calling    Pt C/O medication issue:  1. Name of Medication: diltiazem  120 mg 24 hour er  2. How are you currently taking this medication (dosage and times per day)? Once a day at night   3. Are you having a reaction (difficulty breathing--STAT)? Hand swelling , feet's are tingling, muscle pain    4. What is your medication issue? Is this normal

## 2014-12-11 ENCOUNTER — Telehealth: Payer: Self-pay | Admitting: Family Medicine

## 2014-12-11 NOTE — Telephone Encounter (Addendum)
BPs 147/97, 137/99.  HRs 90s-110s.     HR averaging in 110s at rest. Reviewed with DOD, Dr. Katrinka BlazingSmith - recommendations for patient to begin the Maxzide to see if improvement in edema. Will continue Diltiazem for now and review with Dr. Graciela HusbandsKlein, when he returns next week, on options for possible medication change. Patient verbalized understanding and agreeable to plan.

## 2014-12-11 NOTE — Telephone Encounter (Signed)
Pt called wondering status of referral for Endocrinologist. Pt was informed that we were unable to do anything, and that [facility], would have to be the one to schedule the appt.

## 2014-12-20 MED ORDER — DILTIAZEM HCL ER COATED BEADS 180 MG PO CP24: 180.0000 mg | ORAL_CAPSULE | Freq: Every day | ORAL | Status: DC

## 2014-12-20 NOTE — Telephone Encounter (Signed)
Reports heart rates at rest 107-110s, with activity 120s. BPs upper 130s/90s. Developed stomach cramps and muscle aches/pains.  Discussed that possibly be related to Maxzide (stomach) and Diltiazem (muscle). She does state that edema is improved, but hands/feet are swollen by evening time - but states this is manageable. Reports losing more fluid on Maxzide, but not sure if can continue with stomach cramp issues. Discussed HR with Gypsy Balsam, NP - order received to increase Diltiazem to 180.  Rx sent to [facility] per pt request. Advised to contact office if muscle/stomach pain increases. She understands I will call her Tuesday to follow up and determine if need to re-address current plan.

## 2014-12-22 ENCOUNTER — Other Ambulatory Visit: Payer: Self-pay | Admitting: Neurology

## 2014-12-25 NOTE — Telephone Encounter (Signed)
lmtcb

## 2014-12-28 ENCOUNTER — Other Ambulatory Visit: Payer: Self-pay | Admitting: Neurosurgery

## 2014-12-28 DIAGNOSIS — M5126 Other intervertebral disc displacement, lumbar region: Secondary | ICD-10-CM

## 2015-01-02 ENCOUNTER — Ambulatory Visit
Admission: RE | Admit: 2015-01-02 | Discharge: 2015-01-02 | Disposition: A | Discharge status: Home / Self Care | Payer: BLUE CROSS/BLUE SHIELD | Source: Ambulatory Visit | Attending: Neurosurgery | Admitting: Neurosurgery

## 2015-01-02 DIAGNOSIS — M5126 Other intervertebral disc displacement, lumbar region: Secondary | ICD-10-CM

## 2015-01-04 ENCOUNTER — Other Ambulatory Visit: Payer: Self-pay | Admitting: *Deleted

## 2015-01-06 NOTE — Progress Notes (Signed)
Electrophysiology Office Note Date: 01/07/2015  ID:  [name], DOB 10-31-1975, MRN [id number]  PCP: [name], MD Electrophysiologist: Graciela Husbands  CC: follow up on tachycardia  [name] is a 39 y.o. female seen today for Dr Graciela Husbands.  She is seen in follow up for sinus tachycardia and hypertension.  Her BB was discontinued 2/2 fatigue and she was placed on Verapamil which caused constipation.  She was subsequently started on Diltiazem with improvement in heart rates.  Urine metanepherines were elevated and she underwent CT scanning which did not demonstrate any adrenal nodules or masses. She is undergoing workup by endocrine.  She presents today for routine electrophysiology followup.  Since last being seen in our clinic, the patient reports doing reasonably well.  She has persistent fatigue.  Her heart rates are better controlled and she feels that she is tolerating the Diltiazem better than she has any other rate controlling medications. Her blood pressure is much improved. She has not yet had sleep study. She is pending back surgery with Dr [name] in a couple of months and is asking for surgical clearance today.   She denies chest pain, palpitations, PND, orthopnea, nausea, vomiting, dizziness, syncope.  Past Medical History  Diagnosis Date  . Allergy   . GERD (gastroesophageal reflux disease)   . Polycystic ovaries   . B12 deficiency   . Peripheral neuropathy   . Fibromyalgia   . Depression   . Migraine   . Anxiety   . Intervertebral disc protrusion 01/11/2014   Past Surgical History  Procedure Laterality Date  . Appendectomy    . Vaginal hysterectomy    . Tonsillectomy    . Sinusotomy  12/14/06  . Wrist ganglion excision    . Ovarian cyst removal    . Oophorectomy      left overy  . Rectocele repair    . Bladder tack  2012  . Back surgery  2012    left laminectomy at L3-4 per Dr. Phoebe Perch   . Lumbar disc surgery  01-31-14    per Dr. [name]     Current Outpatient  Prescriptions  Medication Sig Dispense Refill  . ALPRAZolam (XANAX) 0.5 MG tablet Take 0.5 mg by mouth 3 (three) times daily as needed for anxiety.    . BD INTEGRA SYRINGE 25G X 1" 3 ML MISC USE AS DIRECTED 50 each 1  . calcium carbonate (TUMS - DOSED IN MG ELEMENTAL CALCIUM) 500 MG chewable tablet Chew 1 tablet by mouth 2 (two) times daily as needed for indigestion or heartburn.    . Cholecalciferol (VITAMIN D3) 2000 UNITS TABS Take 1 tablet by mouth daily.    . cyanocobalamin (,VITAMIN B-12,) 1000 MCG/ML injection INJECT 1 ML (1,000 MCG TOTAL) INTO THE MUSCLE EVERY 7 DAYS. 12 mL 3  . cyclobenzaprine (FLEXERIL) 10 MG tablet Take 10 mg by mouth every 8 (eight) hours as needed for muscle spasms. PATIENT TAKES FOR MUSCLE SPASMS    . diltiazem (CARDIZEM CD) 180 MG 24 hr capsule Take 1 capsule (180 mg total) by mouth daily. 30 capsule 1  . fluticasone (FLONASE) 50 MCG/ACT nasal spray Place 2 sprays into both nostrils daily.    Marland Kitchen gabapentin (NEURONTIN) 300 MG capsule Take 1 capsule (300 mg total) by mouth 3 (three) times daily. 90 capsule 5  . Multiple Vitamins-Minerals (MULTI-VITAMIN GUMMIES PO) Take 2 tablets by mouth daily.     . nortriptyline (PAMELOR) 50 MG capsule TAKE 1 CAPSULE BY MOUTH AT BEDTIME 30  capsule 11  . oxyCODONE-acetaminophen (PERCOCET) 10-325 MG per tablet Take 1 tablet by mouth every 6 (six) hours as needed for pain. 60 tablet 0  . pantoprazole (PROTONIX) 40 MG tablet TAKE 1 TABLET BY MOUTH TWICE A DAY 180 tablet 1  . promethazine (PHENERGAN) 25 MG tablet Take 1 tablet (25 mg total) by mouth every 6 (six) hours as needed for nausea. PATIENT TAKES FOR NAUSEA 90 tablet 5  . simethicone (MYLICON) 80 MG chewable tablet Chew 160 mg by mouth every 6 (six) hours as needed (gas pain).    . sucralfate (CARAFATE) 1 GM/10ML suspension Take 10 mLs (1 g total) by mouth 4 (four) times daily -  with meals and at bedtime. 420 mL 1  . SUMAtriptan (IMITREX) 50 MG tablet TAKE 1 TABLET BY MOUTH AT  ONSET OF HEADACHE MAY REPEAT ONCE IN 2 HOURS IF PERSISTS OR RECURS 9 tablet 2  . triamterene-hydrochlorothiazide (MAXZIDE-25) 37.5-25 MG per tablet Take 1 tablet by mouth daily. 30 tablet 6   No current facility-administered medications for this visit.    Allergies:   Gadolinium derivatives   Social History: History   Social History  . Marital Status: Married    Spouse Name: N/A  . Number of Children: 2  . Years of Education: N/A   Occupational History  . housewife    Social History Main Topics  . Smoking status: Former Smoker    Quit date: 06/01/1998  . Smokeless tobacco: Never Used  . Alcohol Use: No  . Drug Use: No  . Sexual Activity: Yes    Birth Control/ Protection: Surgical   Other Topics Concern  . Not on file   Social History Narrative    Family History: Family History  Problem Relation Age of Onset  . Fibromyalgia Mother   . Diabetes Mother   . Thyroid cancer Mother   . Liver disease Mother   . Neuropathy Mother   . Hypertension Father   . Diabetes Father   . Heart disease Father   . Colon cancer Neg Hx   . Prostate cancer Father   . Cirrhosis Mother     non-alcoholic  . Pulmonary embolism Mother     both lungs  . Heart attack Father     x 2    Review of Systems: All other systems reviewed and are otherwise negative except as noted above.   Physical Exam: VS:  BP 128/90 mmHg  Pulse 93  Ht 5\' 9"  (1.753 m)  Wt 270 lb (122.471 kg)  BMI 39.85 kg/m2  SpO2 98%  LMP 12/13/1999 , BMI Body mass index is 39.85 kg/(m^2). Wt Readings from Last 3 Encounters:  01/07/15 270 lb (122.471 kg)  11/16/14 270 lb (122.471 kg)  11/07/14 272 lb 12.8 oz (123.741 kg)    GEN- The patient is obese appearing, alert and oriented x 3 today.   HEENT: normocephalic, atraumatic; sclera clear, conjunctiva pink; hearing intact; oropharynx clear; neck supple  Lymph- no cervical lymphadenopathy Lungs- Clear to ausculation bilaterally, normal work of breathing.  No  wheezes, rales, rhonchi Heart- Regular rate and rhythm  GI- obese, non-tender, non-distended, bowel sounds present  Extremities- no clubbing, cyanosis, 1+BLE edema; DP/PT/radial pulses 1+ bilaterally MS- no significant deformity or atrophy Skin- warm and dry, no rash or lesion  Psych- euthymic mood, full affect Neuro- strength and sensation are intact   EKG:  EKG is not ordered today.  Recent Labs: 04/02/2014: TSH 1.06 05/11/2014: ALT 16; Hemoglobin 14.8; Platelets 371 10/22/2014:  BUN 13; Creatinine, Ser 0.80; Potassium 3.8; Sodium 139    Other studies Reviewed: Additional studies/ records that were reviewed today include: Dr Odessa [facility] office notes  Assessment and Plan: 1.  Sinus tachycardia Rates improved on Diltiazem - will continue for now Continue endocrine evaluation  2.  Morbid obesity Body mass index is 39.85 kg/(m^2). Weight loss encouraged   3.  HTN Stable No change required today  4.  Daytime somnolence/fatigue Will pursue sleep study  Current medicines are reviewed at length with the patient today.   The patient does not have concerns regarding her medicines.  The following changes were made today:  none  Labs/ tests ordered today include: none   Disposition:   Follow up with Dr Graciela Husbands in 6 months   Signed, Gypsy Balsam, NP 01/07/2015 1:01 PM   [facility] [address] [place] [address] [phone] (office) [phone] (fax

## 2015-01-07 ENCOUNTER — Encounter: Payer: Self-pay | Admitting: Nurse Practitioner

## 2015-01-07 ENCOUNTER — Ambulatory Visit (INDEPENDENT_AMBULATORY_CARE_PROVIDER_SITE_OTHER): Payer: BLUE CROSS/BLUE SHIELD | Admitting: Nurse Practitioner

## 2015-01-07 VITALS — BP 128/90 | HR 93 | Ht 69.0 in | Wt 270.0 lb

## 2015-01-07 DIAGNOSIS — I471 Supraventricular tachycardia: Secondary | ICD-10-CM | POA: Diagnosis not present

## 2015-01-07 DIAGNOSIS — R5383 Other fatigue: Secondary | ICD-10-CM | POA: Diagnosis not present

## 2015-01-07 DIAGNOSIS — R000 Tachycardia, unspecified: Secondary | ICD-10-CM

## 2015-01-07 DIAGNOSIS — G4710 Hypersomnia, unspecified: Secondary | ICD-10-CM

## 2015-01-07 DIAGNOSIS — R400 Somnolence: Secondary | ICD-10-CM

## 2015-01-07 DIAGNOSIS — I10 Essential (primary) hypertension: Secondary | ICD-10-CM

## 2015-01-07 NOTE — Patient Instructions (Addendum)
Medication Instructions:   Your physician recommends that you continue on your current medications as directed. Please refer to the Current Medication list given to you today.    Labwork:  NONE ORDER TODAY   Testing/Procedures:  Your physician has recommended that you have a sleep study. This test records several body functions during sleep, including: brain activity, eye movement, oxygen and carbon dioxide blood levels, heart rate and rhythm, breathing rate and rhythm, the flow of air through your mouth and nose, snoring, body muscle movements, and chest and belly movement.   Follow-Up:  Your physician wants you to follow-up in:  IN  6  MONTHS WITH DR Logan Bores will receive a reminder letter in the mail two months in advance. If you don't receive a letter, please call our office to schedule the follow-up appointment.      Any Other Special Instructions Will Be Listed Below (If Applicable).

## 2015-01-10 NOTE — Telephone Encounter (Signed)
lmtcb

## 2015-01-15 ENCOUNTER — Other Ambulatory Visit: Payer: Self-pay | Admitting: Neurology

## 2015-01-22 ENCOUNTER — Ambulatory Visit (INDEPENDENT_AMBULATORY_CARE_PROVIDER_SITE_OTHER): Payer: BLUE CROSS/BLUE SHIELD | Admitting: Endocrinology

## 2015-01-22 ENCOUNTER — Encounter: Payer: Self-pay | Admitting: Endocrinology

## 2015-01-22 ENCOUNTER — Telehealth: Payer: Self-pay | Admitting: Endocrinology

## 2015-01-22 VITALS — BP 132/84 | HR 114 | Temp 97.8°F | Ht 69.0 in | Wt 269.0 lb

## 2015-01-22 DIAGNOSIS — R51 Headache: Secondary | ICD-10-CM | POA: Diagnosis not present

## 2015-01-22 DIAGNOSIS — R519 Headache, unspecified: Secondary | ICD-10-CM

## 2015-01-22 NOTE — Progress Notes (Signed)
Subjective:    Patient ID: [name], female    DOB: 04-21-1976, 39 y.o.   MRN: [id number]  HPI Pt states 1 year of intermittent moderate palpitations in the chest, and assoc fatigue.  She was dx'ed with HTN in early 2016.  She takes nortriptyline for migraine (Dr Everlena Cooper).  Past Medical History  Diagnosis Date  . Allergy   . GERD (gastroesophageal reflux disease)   . Polycystic ovaries   . B12 deficiency   . Peripheral neuropathy   . Fibromyalgia   . Depression   . Migraine   . Anxiety   . Intervertebral disc protrusion 01/11/2014    Past Surgical History  Procedure Laterality Date  . Appendectomy    . Vaginal hysterectomy    . Tonsillectomy    . Sinusotomy  12/14/06  . Wrist ganglion excision    . Ovarian cyst removal    . Oophorectomy      left overy  . Rectocele repair    . Bladder tack  2012  . Back surgery  2012    left laminectomy at L3-4 per Dr. Phoebe Perch   . Lumbar disc surgery  01-31-14    per Dr. [name]     Social History   Social History  . Marital Status: Married    Spouse Name: N/A  . Number of Children: 2  . Years of Education: N/A   Occupational History  . housewife    Social History Main Topics  . Smoking status: Former Smoker    Quit date: 06/01/1998  . Smokeless tobacco: Never Used  . Alcohol Use: No  . Drug Use: No  . Sexual Activity: Yes    Birth Control/ Protection: Surgical   Other Topics Concern  . Not on file   Social History Narrative    Current Outpatient Prescriptions on File Prior to Visit  Medication Sig Dispense Refill  . ALPRAZolam (XANAX) 0.5 MG tablet Take 0.5 mg by mouth 3 (three) times daily as needed for anxiety.    . BD INTEGRA SYRINGE 25G X 1" 3 ML MISC USE AS DIRECTED 50 each 1  . calcium carbonate (TUMS - DOSED IN MG ELEMENTAL CALCIUM) 500 MG chewable tablet Chew 1 tablet by mouth 2 (two) times daily as needed for indigestion or heartburn.    . Cholecalciferol (VITAMIN D3) 2000 UNITS TABS Take 1 tablet by mouth  daily.    . cyanocobalamin (,VITAMIN B-12,) 1000 MCG/ML injection INJECT 1 ML (1,000 MCG TOTAL) INTO THE MUSCLE EVERY 7 DAYS. 12 mL 3  . cyclobenzaprine (FLEXERIL) 10 MG tablet Take 10 mg by mouth every 8 (eight) hours as needed for muscle spasms. PATIENT TAKES FOR MUSCLE SPASMS    . diltiazem (CARDIZEM CD) 180 MG 24 hr capsule Take 1 capsule (180 mg total) by mouth daily. 30 capsule 1  . fluticasone (FLONASE) 50 MCG/ACT nasal spray Place 2 sprays into both nostrils daily.    Marland Kitchen gabapentin (NEURONTIN) 300 MG capsule Take 1 capsule (300 mg total) by mouth 3 (three) times daily. 90 capsule 5  . Multiple Vitamins-Minerals (MULTI-VITAMIN GUMMIES PO) Take 2 tablets by mouth daily.     Marland Kitchen oxyCODONE-acetaminophen (PERCOCET) 10-325 MG per tablet Take 1 tablet by mouth every 6 (six) hours as needed for pain. 60 tablet 0  . pantoprazole (PROTONIX) 40 MG tablet TAKE 1 TABLET BY MOUTH TWICE A DAY 180 tablet 1  . promethazine (PHENERGAN) 25 MG tablet Take 1 tablet (25 mg total) by mouth every 6 (six)  hours as needed for nausea. PATIENT TAKES FOR NAUSEA 90 tablet 5  . simethicone (MYLICON) 80 MG chewable tablet Chew 160 mg by mouth every 6 (six) hours as needed (gas pain).    . SUMAtriptan (IMITREX) 50 MG tablet TAKE 1 TABLET BY MOUTH AT ONSET OF HEADACHE MAY REPEAT ONCE IN 2 HOURS IF PERSISTS OR RECURS 9 tablet 2  . triamterene-hydrochlorothiazide (MAXZIDE-25) 37.5-25 MG per tablet Take 1 tablet by mouth daily. 30 tablet 6  . sucralfate (CARAFATE) 1 GM/10ML suspension Take 10 mLs (1 g total) by mouth 4 (four) times daily -  with meals and at bedtime. (Patient not taking: Reported on 01/22/2015) 420 mL 1   No current facility-administered medications on file prior to visit.    Allergies  Allergen Reactions  . Gadolinium Derivatives Swelling and Other (See Comments)    Arm swelling, tingling, & redness. Radiologist Dr. Grace Isaac recommends an injection of steroids if patient needs a gadolinium based contrast in the  future.    Family History  Problem Relation Age of Onset  . Fibromyalgia Mother   . Diabetes Mother   . Thyroid cancer Mother   . Liver disease Mother   . Neuropathy Mother   . Hypertension Father   . Diabetes Father   . Heart disease Father   . Colon cancer Neg Hx   . Prostate cancer Father   . Cirrhosis Mother     non-alcoholic  . Pulmonary embolism Mother     both lungs  . Heart attack Father     x 2  . Other Neg Hx     pheochromocytoma    BP 132/84 mmHg  Pulse 114  Temp(Src) 97.8 F (36.6 C) (Oral)  Ht 5\' 9"  (1.753 m)  Wt 269 lb (122.018 kg)  BMI 39.71 kg/m2  SpO2 94%  LMP 12/13/1999  Review of Systems denies pallor, n/v, syncope, chest pain, sob, anxiety, visual loss, fever, and arthralgias.  She has excessive diaphoresis, leg edema, rhinorrhea, daily flushing, weight gain, daily headache, and easy bruising.      Objective:   Physical Exam VS: see vs page GEN: no distress HEAD: head: no deformity eyes: no periorbital swelling, no proptosis external nose and ears are normal mouth: no lesion seen.  No neurofibromata. NECK: supple, thyroid is not enlarged CHEST WALL: no deformity LUNGS:  Clear to auscultation.   CV: reg rate and rhythm, no murmur ABD: abdomen is soft, nontender.  no hepatosplenomegaly.  not distended.  no hernia MUSCULOSKELETAL: muscle bulk and strength are grossly normal.  no obvious joint swelling.  gait is normal and steady EXTEMITIES: no deformity.  no ulcer on the feet.  feet are of normal color and temp.  no edema PULSES: dorsalis pedis intact bilat.  no carotid bruit NEURO:  cn 2-12 grossly intact.   readily moves all 4's.  sensation is intact to touch on the feet SKIN:  Normal texture and temperature.  No rash or suspicious lesion is visible.  No cafe-au-lait spots. NODES:  None palpable at the neck PSYCH: alert, well-oriented.  Does not appear anxious nor depressed.   outside test results are reviewed: 24-hr urine catechols and  metanephrines are slightly high  Radiol: MRI of adrenals: no nodule  i personally reviewed electrocardiogram tracing (10/05/14): normal    Assessment & Plan:  Tachycardia/HTN, new.  Unlikely due to pheochromocytoma. Abnormal 24-hr urines catecholamine excretion, prob due to nortriptyline. FHX pos for thyroid cancer, uncertain type.    Patient is advised the  following: Patient Instructions  i have sent a message to Dr Everlena Cooper, to see if you can go off the nortriptyline, as this can interfere with the 24-HR urine test. If we get the approval from him, we'll recheck the urine test off it.   Please let me know what type of thyroid cancer your mother has.   It is unlikely that you have the condition called "pheochromocytoma."   Addendum: Dr Everlena Cooper responds that pt can d/c nortriptyline, so pt will redo urines in 1 week

## 2015-01-22 NOTE — Patient Instructions (Signed)
i have sent a message to Dr Everlena Cooper, to see if you can go off the nortriptyline, as this can interfere with the 24-HR urine test. If we get the approval from him, we'll recheck the urine test off it.   Please let me know what type of thyroid cancer your mother has.   It is unlikely that you have the condition called "pheochromocytoma."

## 2015-01-22 NOTE — Telephone Encounter (Signed)
please call patient: Dr Everlena Cooper says you can stop the nortriptyline. Please d/c it, and come to the lab in 1 week, to pick up a urine container

## 2015-01-23 NOTE — Telephone Encounter (Signed)
Left voicemail advising of note below. Requested call back if the pt would like to discuss.  

## 2015-01-24 ENCOUNTER — Telehealth: Payer: Self-pay | Admitting: Endocrinology

## 2015-01-24 ENCOUNTER — Telehealth: Payer: Self-pay | Admitting: Neurology

## 2015-01-24 NOTE — Telephone Encounter (Signed)
Pt called about d/c nortriptyline. Pt wanted to verify if she should ween her self off this medication or if she can stop it completely for 1 week. Also, pt stated the type of thyroid cancer her mother had was papillary micro carcinoma   Please advise, thanks!

## 2015-01-24 NOTE — Telephone Encounter (Signed)
Pt called about stopping a medication , please call pt back at [phone]

## 2015-01-24 NOTE — Telephone Encounter (Signed)
Pt not understanding about making an appt/ call @ [phone]

## 2015-01-24 NOTE — Telephone Encounter (Signed)
Pt called about med/" Nortritilyne?"/ can she come off of this med? [phone]

## 2015-01-24 NOTE — Telephone Encounter (Signed)
Called patient back for clarification. She states she is having heart trouble and needs to have a test done for heart clearance for a back surgery. Patient has to be off Nortriptyline for one week for this test. She was given the okay, but is making sure she did not need to wean off of it since she is taking 50 mg. Please advise. She does not want to stop this permanently since she is still taking a lot of pain medication for back issues and wants to know if she needs to wean back on it when it is restarted.

## 2015-01-24 NOTE — Telephone Encounter (Signed)
You can either take 1/2 qd x 1 week, then stop, or just stop--your choice. This is not the hereditary type of thyroid cancer--good

## 2015-01-24 NOTE — Telephone Encounter (Signed)
Patient made aware she missed her last appt and hasn't been seen since October. If she wants to discuss medication changes she really needs to make a follow up appt. She is to call with any other questions.

## 2015-01-24 NOTE — Telephone Encounter (Signed)
  is still a small dose, but she can take  daily for 1 week and then stop.  When she is ready to resume, she can take  again at bedtime for 7 days, and then increase it back to  at bedtime.  If the headaches are controlled and she wishes to have the nortriptyline prescribed by me, she will need to see me at least once a year.  Otherwise, her PCP can refill it if they are comfortable with that.

## 2015-01-25 MED ORDER — NORTRIPTYLINE HCL 25 MG PO CAPS: 25.0000 mg | ORAL_CAPSULE | Freq: Every day | ORAL | Status: DC

## 2015-01-25 NOTE — Telephone Encounter (Signed)
I contacted the pt and left a voicemail advising of note below. Requested call back if the pt would like to discuss.  

## 2015-01-25 NOTE — Telephone Encounter (Signed)
Patient made aware. Two weeks of Nortriptyline 25 mg sent to pharmacy.

## 2015-01-29 ENCOUNTER — Telehealth: Payer: Self-pay | Admitting: *Deleted

## 2015-01-29 NOTE — Telephone Encounter (Signed)
Left message to call back regarding setting up Home Sleep Study

## 2015-01-30 ENCOUNTER — Other Ambulatory Visit: Payer: Self-pay | Admitting: Neurosurgery

## 2015-02-06 ENCOUNTER — Ambulatory Visit (INDEPENDENT_AMBULATORY_CARE_PROVIDER_SITE_OTHER): Payer: BLUE CROSS/BLUE SHIELD | Admitting: Podiatry

## 2015-02-06 ENCOUNTER — Encounter: Payer: Self-pay | Admitting: Podiatry

## 2015-02-06 ENCOUNTER — Ambulatory Visit (INDEPENDENT_AMBULATORY_CARE_PROVIDER_SITE_OTHER): Payer: BLUE CROSS/BLUE SHIELD

## 2015-02-06 VITALS — BP 109/68 | HR 86 | Resp 16 | Ht 69.0 in | Wt 265.0 lb

## 2015-02-06 DIAGNOSIS — R609 Edema, unspecified: Secondary | ICD-10-CM | POA: Diagnosis not present

## 2015-02-06 DIAGNOSIS — M779 Enthesopathy, unspecified: Secondary | ICD-10-CM

## 2015-02-06 DIAGNOSIS — M79671 Pain in right foot: Secondary | ICD-10-CM

## 2015-02-06 DIAGNOSIS — M79672 Pain in left foot: Secondary | ICD-10-CM | POA: Diagnosis not present

## 2015-02-06 NOTE — Progress Notes (Signed)
   Subjective:    Patient ID: [name], female    DOB: December 30, 1975, 39 y.o.   MRN: [id number]  HPI Patient presents with toe pain in their Right foot, Toes 3-5. Pt stated, "hit gym equipment on July 2016".  Pt also present with foot pain in their left foot, swelling. This has been going on for the past 4-5 years.  Pt also presents with a nail problem on their Right foot, 2nd toe, discoloration of nail (looks black); This has been going on for the past 4-5 months.     Review of Systems  Constitutional: Positive for fatigue.  HENT: Positive for sinus pressure.   Cardiovascular: Positive for palpitations and leg swelling.  Musculoskeletal: Positive for back pain and arthralgias.  Neurological: Positive for headaches.  Hematological: Bruises/bleeds easily.  All other systems reviewed and are negative.      Objective:   Physical Exam        Assessment & Plan:

## 2015-02-06 NOTE — Progress Notes (Signed)
Subjective:     Patient ID: [name], female   DOB: 11-26-1975, 39 y.o.   MRN: [id number]  HPI patient presents with pain around the fourth MPJ right and moderate pain in the third and second MPJ right over left with edema in the left forefoot and ankle that's been present for a long time. She states that she's been traumatizing her foot against a piece of workout equipment in her bedroom   Review of Systems     Objective:   Physical Exam Neurovascular status intact muscle strength adequate with inflammation and discomfort around the lesser MPJs right and swelling left over right foot with moderate discomfort also in the left foot also noted to have nail disease second right secondary to trauma    Assessment:     Inflammatory capsulitis of the right over left foot with trauma noted and probable lymphedema left along with nail disease    Plan:     H&P and condition reviewed. I went ahead today and I dispensed pads for the right foot reviewed x-rays and anklet for the left with possibility injection treatment of the right if symptoms were to persist. Patient is due for a back fusion and we cannot use anti-inflammatory at this time

## 2015-02-11 ENCOUNTER — Other Ambulatory Visit: Payer: Self-pay | Admitting: Family Medicine

## 2015-02-11 NOTE — Telephone Encounter (Signed)
Should pt be taking this?

## 2015-02-14 ENCOUNTER — Telehealth: Payer: Self-pay | Admitting: *Deleted

## 2015-02-14 NOTE — Telephone Encounter (Signed)
I have left 2 messages for patient to call regarding sleep study setup.

## 2015-02-19 ENCOUNTER — Telehealth: Payer: Self-pay | Admitting: Internal Medicine

## 2015-02-19 DIAGNOSIS — Z79899 Other long term (current) drug therapy: Secondary | ICD-10-CM

## 2015-02-19 MED ORDER — POTASSIUM CHLORIDE CRYS ER 20 MEQ PO TBCR: 20.0000 meq | EXTENDED_RELEASE_TABLET | Freq: Every day | ORAL | Status: DC

## 2015-02-19 NOTE — Telephone Encounter (Signed)
Returned patients call,  Patient has stopped Maxide because of abd cramps and skin problems.  Wants to be started back on HCTZ.  Has also d/c'd diltiazem because she had "bone pain", says she feels better.   Asked about latest B/P's, during last week range of 127/89 to 133/92.  When she stopped Maxide, she took one HCTZ and her leg started hurting but she thinks that was coincidental.

## 2015-02-19 NOTE — Telephone Encounter (Signed)
Called patient, advised that Dr Graciela Husbands says it is ok to go back on HCTZ and Potassium and for her to stop diltiazem.  Wants her to have a BMET in 3 weeks.   Patient replied that since taliking to me she would like to get an appt in about 3 weeks due to an upcoming spinal surgery in Oct.  Made appt with Gypsy Balsam and scheduled bloodwork 03-13-15@3 :20.

## 2015-02-19 NOTE — Telephone Encounter (Signed)
New Message  Pt c/o medication issue:  1. Name of Medication: Maxzide   2. How are you currently taking this medication (dosage and times per day)? Once a day every morning   3. Are you having a reaction (difficulty breathing--STAT)? She doesn't think she is. HAs taken it for two months. She just forgot to take it and had the old medication (the plain HCTZ) but her leg started hurting.    4. What is your medication issue? Pt wants to see if she begin to take the HCTZ again because she really doesn't like to take the maxzide. She is not sure if the leg pain is related to the medicatio. She simply want to switch. Please call back to discuss.

## 2015-02-21 ENCOUNTER — Telehealth: Payer: Self-pay | Admitting: Endocrinology

## 2015-02-27 NOTE — Telephone Encounter (Signed)
Patient is calling for the results of her labs °

## 2015-02-28 NOTE — Telephone Encounter (Signed)
Pt has questions about the results of the 24 hr urine catch, see it says future was it not collected?

## 2015-02-28 NOTE — Telephone Encounter (Signed)
I contacted the pt and advised we are working on figuring out why her test has not been resulted in epic yet. Pt was advised she would receive a call back once we know the status of the test.

## 2015-03-01 ENCOUNTER — Encounter: Payer: Self-pay | Admitting: Family Medicine

## 2015-03-01 ENCOUNTER — Ambulatory Visit (INDEPENDENT_AMBULATORY_CARE_PROVIDER_SITE_OTHER): Payer: BLUE CROSS/BLUE SHIELD | Admitting: Family Medicine

## 2015-03-01 VITALS — BP 128/84 | Temp 98.2°F | Ht 69.0 in | Wt 271.0 lb

## 2015-03-01 DIAGNOSIS — N3000 Acute cystitis without hematuria: Secondary | ICD-10-CM

## 2015-03-01 DIAGNOSIS — E785 Hyperlipidemia, unspecified: Secondary | ICD-10-CM | POA: Diagnosis not present

## 2015-03-01 LAB — POCT URINALYSIS DIPSTICK
Bilirubin, UA: NEGATIVE
Blood, UA: NEGATIVE
Glucose, UA: NEGATIVE
Ketones, UA: NEGATIVE
Leukocytes, UA: NEGATIVE
Nitrite, UA: NEGATIVE
Protein, UA: NEGATIVE
Spec Grav, UA: >=1.03
Urobilinogen, UA: 0.2
pH, UA: 5.5

## 2015-03-01 MED ORDER — CIPROFLOXACIN HCL 500 MG PO TABS: 500.0000 mg | ORAL_TABLET | Freq: Two times a day (BID) | ORAL | Status: DC

## 2015-03-01 MED ORDER — FLUCONAZOLE 150 MG PO TABS: 150.0000 mg | ORAL_TABLET | Freq: Every day | ORAL | Status: DC

## 2015-03-01 MED ORDER — METHYLPREDNISOLONE ACETATE 80 MG/ML IJ SUSP
120.0000 mg | Freq: Once | INTRAMUSCULAR | Status: AC
  Administered 2015-03-01: 120 mg via INTRAMUSCULAR

## 2015-03-01 NOTE — Progress Notes (Signed)
Pre visit review using our clinic review tool, if applicable. No additional management support is needed unless otherwise documented below in the visit note. 

## 2015-03-01 NOTE — Addendum Note (Signed)
Addended by: Aniceto Boss A on: 03/01/2015 02:40 PM   Modules accepted: Orders

## 2015-03-01 NOTE — Addendum Note (Signed)
Addended by: Gershon Crane A on: 03/01/2015 02:31 PM   Modules accepted: Orders

## 2015-03-01 NOTE — Progress Notes (Signed)
   Subjective:    Patient ID: [name], female    DOB: [birth year], 39 y.o.   MRN: [id number]  HPI Here for 2 days of urinary urgency and tingling when she urinates. No fever.    Review of Systems  Constitutional: Negative.   Genitourinary: Positive for dysuria, urgency and frequency. Negative for hematuria and flank pain.       Objective:   Physical Exam  Constitutional: She appears well-developed and well-nourished.  Pulmonary/Chest: Effort normal and breath sounds normal.  Abdominal: Soft. Bowel sounds are normal. She exhibits no distension and no mass. There is no tenderness. There is no rebound and no guarding.          Assessment & Plan:  Early UTI. Treat with Cipro. Drink plenty of water. We did not have enough sample to send for culture.

## 2015-03-02 HISTORY — PX: OTHER SURGICAL HISTORY: SHX169

## 2015-03-04 ENCOUNTER — Encounter: Payer: Self-pay | Admitting: Neurology

## 2015-03-04 ENCOUNTER — Ambulatory Visit (INDEPENDENT_AMBULATORY_CARE_PROVIDER_SITE_OTHER): Payer: BLUE CROSS/BLUE SHIELD | Admitting: Neurology

## 2015-03-04 VITALS — BP 120/84 | HR 72 | Ht 69.0 in | Wt 271.4 lb

## 2015-03-04 DIAGNOSIS — G43709 Chronic migraine without aura, not intractable, without status migrainosus: Secondary | ICD-10-CM

## 2015-03-04 NOTE — Progress Notes (Signed)
NEUROLOGY FOLLOW UP OFFICE NOTE  [name] [id number]  HISTORY OF PRESENT ILLNESS: [name] is a 39 year old right-handed woman with hypertension, morbid obesity, GERD, lumbar disc disease (L4-5) and fibromyalgia (treated with hydrocodone) who presents for chronic migraine and medication-overuse headaches.    UPDATE: She is scheduled for lumbar surgery at the end of the month.  In the meantime, she is taking percocet for back pain.  She underwent workup for hypertension (140s-160s/100s-110) and tachycardia (140 bpm).  Nortriptyline was discontinued for testing catecholamines.  When she stopped the nortriptyline, the blood pressure and heart rate normalized.  She remains off of nortriptyline but the headaches have gotten worse and are daily again.  Sumatriptan  helps.  HISTORY: Onset:  10 years ago Location:  Top of head, sometimes radiating down either side of face to the cheek Quality:  Pressure-like, sometimes pounding Initial intensity:  4-8/10; April 4-8/10 Aura:  Severe attacks preceded by scotomas and imbalance, x 30 minutes Associated symptoms:  Nausea, osmophobia, photophobia, phonophobia, hyperacusis, sometimes vomiting with severe ones Initial Duration:  Was 2-8 days.  April: 1-2 days Initial Frequency:  Almost daily.  April: she has about 5-7 headache-free days a month.  Half of headache days are migraine. Activity:  Needs to lay down Triggers/exacerbating factors:  Change in weather, stress, oranges, hot dogs Relieving factors:  Resting, applying pressure to head  Past abortive therapy:  Ibuprofen (avoids due to celebrex), tylenol (somewhat helpful), tried Imitrex shot once (helpfu but afraid to administer) Past preventative therapy:  topiramate (didn't feel well) Other medications include hydrocodone and celebrex for back pain (no longer takes celebrex every day).  Alcohol: no Caffeine:  A couple of sodas daily Smoking:  no Sleep hygiene:  Poor sleep  (about 3 hours a night) Stress/depression:  Yes, raising her children with husband now out of town often for work Family history of headache:  Aunt with headaches.  Another aunt with headaches and aneurysm rupture.  History of abnormal brain MRIs with nonspecific punctate and patchy white matter hyperintensities, stable over 5 years. 04/30/02 MRI Brain w/wo: nonspecific supratentorial white matter disease. 02/29/04 MRI Brain w/wo: numerous nonspecific punctate and patchy white matter changes in the subcortical and periventricular white matter.  No abnormal enhancement. Similar to prior exam from 2003. 02/29/04 MRA Head: no large vessel occlusion or aneurysm identified. 07/28/06 MRI Brain w/wo: nonspecific punctate and patchy subcortical white matter hyperintensities in the subcortical and periventricular white matter, stable compared to prior study in 2005.  No abnormal enhancement. 07/28/06 MRA Head: Focal decreased caliber of the anterior cerebral arteries proximally with relative decrease in the caliber of the A1 segments compared to the A2 segments bilaterally.   Mild focal decrease in the caliber of the right middle cerebral artery at its origin.  PAST MEDICAL HISTORY: Past Medical History  Diagnosis Date  . Allergy   . GERD (gastroesophageal reflux disease)   . Polycystic ovaries   . B12 deficiency   . Peripheral neuropathy (HCC)   . Fibromyalgia   . Depression   . Migraine   . Anxiety   . Intervertebral disc protrusion 01/11/2014    MEDICATIONS: Current Outpatient Prescriptions on File Prior to Visit  Medication Sig Dispense Refill  . ALPRAZolam (XANAX) 0.5 MG tablet Take 0.5 mg by mouth 3 (three) times daily as needed for anxiety.    . BD INTEGRA SYRINGE 25G X 1" 3 ML MISC USE AS DIRECTED 50 each 1  . calcium carbonate (TUMS -  DOSED IN MG ELEMENTAL CALCIUM) 500 MG chewable tablet Chew 1 tablet by mouth 2 (two) times daily as needed for indigestion or heartburn.    . Cholecalciferol  (VITAMIN D3) 2000 UNITS TABS Take 1 tablet by mouth daily.    . ciprofloxacin (CIPRO) 500 MG tablet Take 1 tablet (500 mg total) by mouth 2 (two) times daily. 14 tablet 0  . cyanocobalamin (,VITAMIN B-12,) 1000 MCG/ML injection INJECT 1 ML (1,000 MCG TOTAL) INTO THE MUSCLE EVERY 7 DAYS. 12 mL 3  . cyclobenzaprine (FLEXERIL) 10 MG tablet Take 10 mg by mouth every 8 (eight) hours as needed for muscle spasms. PATIENT TAKES FOR MUSCLE SPASMS    . diltiazem (CARDIZEM CD) 180 MG 24 hr capsule Take 1 capsule (180 mg total) by mouth daily. (Patient not taking: Reported on 03/01/2015) 30 capsule 1  . fluconazole (DIFLUCAN) 150 MG tablet Take 1 tablet (150 mg total) by mouth daily. 5 tablet 5  . fluticasone (FLONASE) 50 MCG/ACT nasal spray Place 2 sprays into both nostrils daily.    Marland Kitchen gabapentin (NEURONTIN) 300 MG capsule Take 1 capsule (300 mg total) by mouth 3 (three) times daily. 90 capsule 5  . hydrochlorothiazide (HYDRODIURIL) 25 MG tablet TAKE 1 TABLET BY MOUTH EVERY DAY 90 tablet 3  . Multiple Vitamins-Minerals (MULTI-VITAMIN GUMMIES PO) Take 2 tablets by mouth daily.     Marland Kitchen oxyCODONE-acetaminophen (PERCOCET) 10-325 MG per tablet Take 1 tablet by mouth every 6 (six) hours as needed for pain. 60 tablet 0  . pantoprazole (PROTONIX) 40 MG tablet TAKE 1 TABLET BY MOUTH TWICE A DAY 180 tablet 1  . potassium chloride SA (KLOR-CON M20) 20 MEQ tablet Take 1 tablet (20 mEq total) by mouth daily. 90 tablet 2  . promethazine (PHENERGAN) 25 MG tablet Take 1 tablet (25 mg total) by mouth every 6 (six) hours as needed for nausea. PATIENT TAKES FOR NAUSEA 90 tablet 5  . simethicone (MYLICON) 80 MG chewable tablet Chew 160 mg by mouth every 6 (six) hours as needed (gas pain).    . sucralfate (CARAFATE) 1 GM/10ML suspension Take 10 mLs (1 g total) by mouth 4 (four) times daily -  with meals and at bedtime. 420 mL 1  . SUMAtriptan (IMITREX) 50 MG tablet TAKE 1 TABLET BY MOUTH AT ONSET OF HEADACHE MAY REPEAT ONCE IN 2 HOURS  IF PERSISTS OR RECURS 9 tablet 2  . triamterene-hydrochlorothiazide (MAXZIDE-25) 37.5-25 MG per tablet Take 1 tablet by mouth daily. (Patient not taking: Reported on 03/01/2015) 30 tablet 6   No current facility-administered medications on file prior to visit.    ALLERGIES: Allergies  Allergen Reactions  . Gadolinium Derivatives Swelling and Other (See Comments)    Arm swelling, tingling, & redness. Radiologist Dr. Grace Isaac recommends an injection of steroids if patient needs a gadolinium based contrast in the future.    FAMILY HISTORY: Family History  Problem Relation Age of Onset  . Fibromyalgia Mother   . Diabetes Mother   . Thyroid cancer Mother   . Liver disease Mother   . Neuropathy Mother   . Hypertension Father   . Diabetes Father   . Heart disease Father   . Colon cancer Neg Hx   . Prostate cancer Father   . Cirrhosis Mother     non-alcoholic  . Pulmonary embolism Mother     both lungs  . Heart attack Father     x 2  . Other Neg Hx     pheochromocytoma  SOCIAL HISTORY: Social History   Social History  . Marital Status: Married    Spouse Name: N/A  . Number of Children: 2  . Years of Education: N/A   Occupational History  . housewife    Social History Main Topics  . Smoking status: Former Smoker    Quit date: 06/01/1998  . Smokeless tobacco: Never Used  . Alcohol Use: No  . Drug Use: No  . Sexual Activity: Yes    Birth Control/ Protection: Surgical   Other Topics Concern  . Not on file   Social History Narrative    REVIEW OF SYSTEMS: Constitutional: No fevers, chills, or sweats, no generalized fatigue, change in appetite Eyes: No visual changes, double vision, eye pain Ear, nose and throat: No hearing loss, ear pain, nasal congestion, sore throat Cardiovascular: No chest pain, palpitations Respiratory:  No shortness of breath at rest or with exertion, wheezes GastrointestinaI: No nausea, vomiting, diarrhea, abdominal pain, fecal  incontinence Genitourinary:  No dysuria, urinary retention or frequency Musculoskeletal:  No neck pain, back pain Integumentary: No rash, pruritus, skin lesions Neurological: as above Psychiatric: No depression, insomnia, anxiety Endocrine: No palpitations, fatigue, diaphoresis, mood swings, change in appetite, change in weight, increased thirst Hematologic/Lymphatic:  No anemia, purpura, petechiae. Allergic/Immunologic: no itchy/runny eyes, nasal congestion, recent allergic reactions, rashes  PHYSICAL EXAM: Filed Vitals:   03/04/15 1241  BP: 120/84  Pulse: 72   General: No acute distress.  Head:  Normocephalic/atraumatic Eyes:  Fundoscopic exam unremarkable without vessel changes, exudates, hemorrhages or papilledema. Neck: supple, no paraspinal tenderness, full range of motion Heart:  Regular rate and rhythm Lungs:  Clear to auscultation bilaterally Back: bilateral paraspinal tenderness Neurological Exam: alert and oriented to person, place, and time. Attention span and concentration intact, recent and remote memory intact, fund of knowledge intact.  Speech fluent and not dysarthric, language intact.  CN II-XII intact. Fundoscopic exam unremarkable without vessel changes, exudates, hemorrhages or papilledema.  Bulk and tone normal, muscle strength 5/5 throughout.  Sensation to light touch, temperature and vibration intact.  Deep tendon reflexes 2+ throughout, toes downgoing.  Finger to nose and heel to shin testing intact.  Ambulates with limp.  IMPRESSION: Chronic migraine  PLAN: I suggested starting another preventative such as venlafaxine or sertraline.  She tried topiramate several years ago for a short time.  She said it made her feel quiet and a little withdrawn.  I suggested we can try this again and see if she tolerates it better.  She will contact me regarding if she would like to start another agent.  Follow up in 3 months.  Recommend weight loss as well.  31 minutes spent  face to face with patient, over 50% spent discussing management.  Shon Millet, DO  CC:  Gershon Crane, MD

## 2015-03-05 ENCOUNTER — Other Ambulatory Visit: Payer: Self-pay

## 2015-03-05 DIAGNOSIS — G43709 Chronic migraine without aura, not intractable, without status migrainosus: Secondary | ICD-10-CM

## 2015-03-05 NOTE — Telephone Encounter (Signed)
See note below. I contacted the pt and advised the lab confirmed the specimen was never delivered to [facility] to be results. Pt is going to come and pick another 24 urine container up next week and do the test again. Orders have been placed.

## 2015-03-13 ENCOUNTER — Other Ambulatory Visit (INDEPENDENT_AMBULATORY_CARE_PROVIDER_SITE_OTHER): Payer: BLUE CROSS/BLUE SHIELD

## 2015-03-13 ENCOUNTER — Encounter: Payer: Self-pay | Admitting: Nurse Practitioner

## 2015-03-13 ENCOUNTER — Ambulatory Visit (INDEPENDENT_AMBULATORY_CARE_PROVIDER_SITE_OTHER): Payer: BLUE CROSS/BLUE SHIELD | Admitting: Nurse Practitioner

## 2015-03-13 VITALS — BP 128/80 | HR 88 | Ht 69.0 in | Wt 268.0 lb

## 2015-03-13 DIAGNOSIS — Z79899 Other long term (current) drug therapy: Secondary | ICD-10-CM | POA: Diagnosis not present

## 2015-03-13 DIAGNOSIS — R000 Tachycardia, unspecified: Secondary | ICD-10-CM | POA: Diagnosis not present

## 2015-03-13 DIAGNOSIS — E669 Obesity, unspecified: Secondary | ICD-10-CM

## 2015-03-13 LAB — BASIC METABOLIC PANEL
BUN: 16 mg/dL (ref 7–25)
CO2: 31 mmol/L (ref 20–31)
Calcium: 9.3 mg/dL (ref 8.6–10.2)
Chloride: 101 mmol/L (ref 98–110)
Creat: 0.77 mg/dL (ref 0.50–1.10)
Glucose, Bld: 67 mg/dL (ref 65–99)
Potassium: 4 mmol/L (ref 3.5–5.3)
Sodium: 141 mmol/L (ref 135–146)

## 2015-03-13 NOTE — Patient Instructions (Signed)
Medication Instructions: Your physician recommends that you continue on your current medications as directed. Please refer to the Current Medication list given to you today.  [name].  Labwork: BMET TODAY    Testing/Procedures: NONE ORDER TODAY    Follow-Up:  Your physician wants you to follow-up in: ONE YEAR WITH SEILER NP   You will receive a reminder letter in the mail two months in advance. If you don't receive a letter, please call our office to schedule the follow-up appointment.     Any Other Special Instructions Will Be Listed Below (If Applicable).

## 2015-03-13 NOTE — Progress Notes (Signed)
Electrophysiology Office Note Date: 03/13/2015  ID:  [name], DOB 07/12/1975, MRN [id number]  PCP: Nelwyn SalisburyFRY,[name], MD Electrophysiologist: Graciela HusbandsKlein  CC: follow up on tachycardia  [name] is a 39 y.o. female seen today for Dr Graciela HusbandsKlein.  She is seen in follow up for sinus tachycardia and hypertension.  Her BB was discontinued 2/2 fatigue and she was placed on Verapamil which caused constipation.  She was subsequently started on Diltiazem with improvement in heart rates.  Urine metanepherines were elevated and she underwent CT scanning which did not demonstrate any adrenal nodules or masses. She stopped her Nortriptyline for her 24 hour urine tests with endocrine and her symptoms resolved.  She had been on Nortriptyline for chronic migraines. She has had recurrent headaches, but states she is much better with the headaches than she was with the tachycardia. She has discontinued her Diltiazem and has had no recurrent tachycardia. She is sleeping better and underwent home sleep study recently (results pending).  She denies chest pain, palpitations, PND, orthopnea, nausea, vomiting, dizziness, syncope.  Past Medical History  Diagnosis Date  . Allergy   . GERD (gastroesophageal reflux disease)   . Polycystic ovaries   . B12 deficiency   . Peripheral neuropathy (HCC)   . Fibromyalgia   . Depression   . Migraine   . Anxiety   . Intervertebral disc protrusion 01/11/2014   Past Surgical History  Procedure Laterality Date  . Appendectomy    . Vaginal hysterectomy    . Tonsillectomy    . Sinusotomy  12/14/06  . Wrist ganglion excision    . Ovarian cyst removal    . Oophorectomy      left overy  . Rectocele repair    . Bladder tack  2012  . Back surgery  2012    left laminectomy at L3-4 per Dr. Phoebe PerchHirsch   . Lumbar disc surgery  01-31-14    per Dr. Wynetta Emeryram     Current Outpatient Prescriptions  Medication Sig Dispense Refill  . ALPRAZolam (XANAX) 0.5 MG tablet Take 0.5 mg by mouth 3  (three) times daily as needed for anxiety.    . BD INTEGRA SYRINGE 25G X 1" 3 ML MISC USE AS DIRECTED 50 each 1  . calcium carbonate (TUMS - DOSED IN MG ELEMENTAL CALCIUM) 500 MG chewable tablet Chew 1 tablet by mouth 2 (two) times daily as needed for indigestion or heartburn.    . Cholecalciferol (VITAMIN D3) 2000 UNITS TABS Take 1 tablet by mouth daily.    . ciprofloxacin (CIPRO) 500 MG tablet Take 1 tablet (500 mg total) by mouth 2 (two) times daily. 14 tablet 0  . cyanocobalamin (,VITAMIN B-12,) 1000 MCG/ML injection INJECT 1 ML (1,000 MCG TOTAL) INTO THE MUSCLE EVERY 7 DAYS. 12 mL 3  . cyclobenzaprine (FLEXERIL) 10 MG tablet Take 10 mg by mouth every 8 (eight) hours as needed for muscle spasms. PATIENT TAKES FOR MUSCLE SPASMS    . fluconazole (DIFLUCAN) 150 MG tablet Take 1 tablet (150 mg total) by mouth daily. 5 tablet 5  . fluticasone (FLONASE) 50 MCG/ACT nasal spray Place 2 sprays into both nostrils daily.    Marland Kitchen. gabapentin (NEURONTIN) 300 MG capsule Take 1 capsule (300 mg total) by mouth 3 (three) times daily. 90 capsule 5  . hydrochlorothiazide (HYDRODIURIL) 25 MG tablet TAKE 1 TABLET BY MOUTH EVERY DAY 90 tablet 3  . Multiple Vitamins-Minerals (MULTI-VITAMIN GUMMIES PO) Take 2 tablets by mouth daily.     .Marland Kitchen  oxyCODONE-acetaminophen (PERCOCET) 10-325 MG per tablet Take 1 tablet by mouth every 6 (six) hours as needed for pain. 60 tablet 0  . pantoprazole (PROTONIX) 40 MG tablet TAKE 1 TABLET BY MOUTH TWICE A DAY 180 tablet 1  . potassium chloride SA (KLOR-CON M20) 20 MEQ tablet Take 1 tablet (20 mEq total) by mouth daily. 90 tablet 2  . promethazine (PHENERGAN) 25 MG tablet Take 1 tablet (25 mg total) by mouth every 6 (six) hours as needed for nausea. PATIENT TAKES FOR NAUSEA 90 tablet 5  . simethicone (MYLICON) 80 MG chewable tablet Chew 160 mg by mouth every 6 (six) hours as needed (gas pain).    . sucralfate (CARAFATE) 1 GM/10ML suspension Take 10 mLs (1 g total) by mouth 4 (four) times  daily -  with meals and at bedtime. 420 mL 1  . SUMAtriptan (IMITREX) 50 MG tablet TAKE 1 TABLET BY MOUTH AT ONSET OF HEADACHE MAY REPEAT ONCE IN 2 HOURS IF PERSISTS OR RECURS 9 tablet 2   No current facility-administered medications for this visit.    Allergies:   Gadolinium derivatives   Social History: Social History   Social History  . Marital Status: Married    Spouse Name: N/A  . Number of Children: 2  . Years of Education: N/A   Occupational History  . housewife    Social History Main Topics  . Smoking status: Former Smoker    Quit date: 06/01/1998  . Smokeless tobacco: Never Used  . Alcohol Use: No  . Drug Use: No  . Sexual Activity: Yes    Birth Control/ Protection: Surgical   Other Topics Concern  . Not on file   Social History Narrative    Family History: Family History  Problem Relation Age of Onset  . Fibromyalgia Mother   . Diabetes Mother   . Thyroid cancer Mother   . Liver disease Mother   . Neuropathy Mother   . Hypertension Father   . Diabetes Father   . Heart disease Father   . Colon cancer Neg Hx   . Prostate cancer Father   . Cirrhosis Mother     non-alcoholic  . Pulmonary embolism Mother     both lungs  . Heart attack Father     x 2  . Other Neg Hx     pheochromocytoma    Review of Systems: All other systems reviewed and are otherwise negative except as noted above.   Physical Exam: VS:  BP 128/80 mmHg  Pulse 88  Ht  (1.753 m)  Wt 268 lb (121.564 kg)  BMI 39.56 kg/m2  SpO2 95%  LMP 12/13/1999 , BMI Body mass index is 39.56 kg/(m^2). Wt Readings from Last 3 Encounters:  03/13/15 268 lb (121.564 kg)  03/04/15 271 lb 6 oz (123.095 kg)  03/01/15 271 lb (122.925 kg)    GEN- The patient is obese appearing, alert and oriented x 3 today.   HEENT: normocephalic, atraumatic; sclera clear, conjunctiva pink; hearing intact; oropharynx clear; neck supple  Lymph- no cervical lymphadenopathy Lungs- Clear to ausculation  bilaterally, normal work of breathing.  No wheezes, rales, rhonchi Heart- Regular rate and rhythm  GI- obese, non-tender, non-distended, bowel sounds present  Extremities- no clubbing, cyanosis, no edema; DP/PT/radial pulses 1+ bilaterally MS- no significant deformity or atrophy Skin- warm and dry, no rash or lesion  Psych- euthymic mood, full affect Neuro- strength and sensation are intact   EKG:  EKG is not ordered today.  Recent  Labs: 04/02/2014: TSH 1.06 05/11/2014: ALT 16; Hemoglobin 14.8; Platelets 371 10/22/2014: BUN 13; Creatinine, Ser 0.80; Potassium 3.8; Sodium 139    Other studies Reviewed: Additional studies/ records that were reviewed today include: Dr Odessa Fleming office notes  Assessment and Plan: 1.  Sinus tachycardia Improved following discontinuation of Nortriptyline She is working with neurology to find alternative treatement for chronic migraine   2.  Morbid obesity Body mass index is 39.56 kg/(m^2). Weight loss encouraged   3.  HTN Stable No change required today  4.  Daytime somnolence/fatigue Sleep study done - results pending, although patient has had significant improvement in symptoms with discontinuing Nortriptyline  Current medicines are reviewed at length with the patient today.   The patient does not have concerns regarding her medicines.  The following changes were made today:  none  Labs/ tests ordered today include: none   Disposition:   Follow up with me in 1 year, sooner if needed   Signed, Gypsy Balsam, NP 03/13/2015 8:17 PM   [facility] [address] [place] [address] [phone] (office) [phone] (fax)

## 2015-03-18 ENCOUNTER — Other Ambulatory Visit (HOSPITAL_COMMUNITY): Payer: BLUE CROSS/BLUE SHIELD

## 2015-03-21 ENCOUNTER — Telehealth: Payer: Self-pay | Admitting: *Deleted

## 2015-03-21 NOTE — Telephone Encounter (Signed)
-----   Message from Marily LenteAmber K Seiler, NP sent at 03/14/2015  7:53 AM EDT ----- Please notify patient of stable labs. No changes needed

## 2015-03-23 NOTE — Pre-Procedure Instructions (Addendum)
[name]  03/23/2015     Your procedure is scheduled on October 31.  [facility] at 7:30 A.M.  Call this number if you have problems the morning of surgery:  [phone]   Remember:  Do not eat food or drink liquids after midnight.  Take these medicines the morning of surgery with A SIP OF WATER Xanax (if needed), Flonase, Gabapentin, Oxycodone (if needed), Pantoprazole, Promethazine (if needed), Sumatriptan (if needed)   STOP Multiple Vitamins, B12, Vitamin D, today   STOP/ Do not take Aspirin, Aleve, Naproxen, Advil, Ibuprofen, Motrin, Vitamins, Herbs, or Supplements starting today   Do not wear jewelry, make-up or nail polish.  Do not wear lotions, powders, or perfumes.  You may wear deodorant.  Do not shave 48 hours prior to surgery.  Men may shave face and neck.  Do not bring valuables to the hospital.  [facility] is not responsible for any belongings or valuables.  Contacts, dentures or bridgework may not be worn into surgery.  Leave your suitcase in the car.  After surgery it may be brought to your room.  For patients admitted to the hospital, discharge time will be determined by your treatment team.  Patients discharged the day of surgery will not be allowed to drive home.   [facility] - Preparing for Surgery  Before surgery, you can play an important role.  Because skin is not sterile, your skin needs to be as free of germs as possible.  You can reduce the number of germs on you skin by washing with CHG (chlorahexidine gluconate) soap before surgery.  CHG is an antiseptic cleaner which kills germs and bonds with the skin to continue killing germs even after washing.  Please DO NOT use if you have an allergy to CHG or antibacterial soaps.  If your skin becomes reddened/irritated stop using the CHG and inform your nurse when you arrive at Short Stay.  Do not shave (including legs and underarms) for at least 48 hours prior to the  first CHG shower.  You may shave your face.  Please follow these instructions carefully:   1.  Shower with CHG Soap the night before surgery and the morning of Surgery.  2.  If you choose to wash your hair, wash your hair first as usual with your normal shampoo.  3.  After you shampoo, rinse your hair and body thoroughly to remove the shampoo.  4.  Use CHG as you would any other liquid soap.  You can apply CHG directly to the skin and wash gently with scrungie or a clean washcloth.  5.  Apply the CHG Soap to your body ONLY FROM THE NECK DOWN.  Do not use on open wounds or open sores.  Avoid contact with your eyes, ears, mouth and genitals (private parts).  Wash genitals (private parts) with your normal soap.  6.  Wash thoroughly, paying special attention to the area where your surgery will be performed.  7.  Thoroughly rinse your body with warm water from the neck down.  8.  DO NOT shower/wash with your normal soap after using and rinsing off the CHG Soap.  9.  Pat yourself dry with a clean towel.            10.  Wear clean pajamas.            11.  Place clean sheets on your bed the night of your first shower and do not  sleep with pets.  Day of Surgery  Do not apply any lotions the morning of surgery.  Please wear clean clothes to the hospital/surgery center.   Please read over the following fact sheets that you were given. Pain Booklet, Coughing and Deep Breathing, Blood Transfusion Information and Surgical Site Infection Prevention

## 2015-03-25 ENCOUNTER — Encounter (HOSPITAL_COMMUNITY): Payer: Self-pay

## 2015-03-25 ENCOUNTER — Telehealth: Payer: Self-pay | Admitting: Family Medicine

## 2015-03-25 ENCOUNTER — Encounter (HOSPITAL_COMMUNITY)
Admission: RE | Admit: 2015-03-25 | Discharge: 2015-03-25 | Disposition: A | Discharge status: Home / Self Care | Payer: BLUE CROSS/BLUE SHIELD | Source: Ambulatory Visit | Attending: Neurosurgery | Admitting: Neurosurgery

## 2015-03-25 DIAGNOSIS — Z79899 Other long term (current) drug therapy: Secondary | ICD-10-CM | POA: Diagnosis not present

## 2015-03-25 DIAGNOSIS — Z01812 Encounter for preprocedural laboratory examination: Secondary | ICD-10-CM | POA: Insufficient documentation

## 2015-03-25 DIAGNOSIS — K219 Gastro-esophageal reflux disease without esophagitis: Secondary | ICD-10-CM | POA: Insufficient documentation

## 2015-03-25 DIAGNOSIS — Z0183 Encounter for blood typing: Secondary | ICD-10-CM | POA: Diagnosis not present

## 2015-03-25 DIAGNOSIS — Z87891 Personal history of nicotine dependence: Secondary | ICD-10-CM | POA: Diagnosis not present

## 2015-03-25 DIAGNOSIS — Z01818 Encounter for other preprocedural examination: Secondary | ICD-10-CM | POA: Insufficient documentation

## 2015-03-25 HISTORY — DX: Cardiac arrhythmia, unspecified: I49.9

## 2015-03-25 HISTORY — DX: Esophagitis, unspecified without bleeding: K20.90

## 2015-03-25 HISTORY — DX: Esophagitis, unspecified: K20.9

## 2015-03-25 HISTORY — DX: Personal history of urinary calculi: Z87.442

## 2015-03-25 LAB — SURGICAL PCR SCREEN
MRSA, PCR: NEGATIVE
Staphylococcus aureus: NEGATIVE

## 2015-03-25 LAB — BASIC METABOLIC PANEL
Anion gap: 10 (ref 5–15)
BUN: 16 mg/dL (ref 6–20)
CO2: 28 mmol/L (ref 22–32)
Calcium: 9 mg/dL (ref 8.9–10.3)
Chloride: 104 mmol/L (ref 101–111)
Creatinine, Ser: 0.78 mg/dL (ref 0.44–1.00)
GFR calc Af Amer: >60 mL/min (ref >60)
GFR calc non Af Amer: >60 mL/min (ref >60)
Glucose, Bld: 101 mg/dL — ABNORMAL HIGH (ref 65–99)
Potassium: 3.7 mmol/L (ref 3.5–5.1)
Sodium: 142 mmol/L (ref 135–145)

## 2015-03-25 LAB — CBC
HCT: 41.6 % (ref 36.0–46.0)
Hemoglobin: 14.1 g/dL (ref 12.0–15.0)
MCH: 30.3 pg (ref 26.0–34.0)
MCHC: 33.9 g/dL (ref 30.0–36.0)
MCV: 89.5 fL (ref 78.0–100.0)
Platelets: 302 10*3/uL (ref 150–400)
RBC: 4.65 MIL/uL (ref 3.87–5.11)
RDW: 13.4 % (ref 11.5–15.5)
WBC: 9.7 10*3/uL (ref 4.0–10.5)

## 2015-03-25 LAB — ABO/RH: ABO/RH(D): O POS

## 2015-03-25 LAB — TYPE AND SCREEN
ABO/RH(D): O POS
Antibody Screen: NEGATIVE

## 2015-03-25 NOTE — Telephone Encounter (Signed)
Patient said the pharmacy has faxed over an authorization for a refill on her Xanax but they have not heard anything yet.  She wants to know if there was a reason why this prescription has not been processed.

## 2015-03-26 MED ORDER — ALPRAZOLAM 0.5 MG PO TABS: 0.5000 mg | ORAL_TABLET | Freq: Three times a day (TID) | ORAL | Status: DC | PRN

## 2015-03-26 NOTE — Progress Notes (Signed)
Anesthesia Chart Review:  Pt is 39 year old female scheduled for L3-4, L4-5 PLIF on 04/01/2015 with Dr. Wynetta Emeryram.   PMH includes: sinus tachycardia, B12 deficiency, GERD. Former smoker. BMI 40.   Medications include: hctz, protonix.   Preoperative labs reviewed.    Chest x-ray 05/11/2014 reviewed. No active cardiopulmonary disease.   EKG 10/04/2014: NSR.   Echo 08/29/2014:  - Left ventricle: The cavity size was normal. Wall thickness was normal. Systolic function was normal. The estimated ejection fraction was in the range of 60% to 65%. Wall motion was normal; there were no regional wall motion abnormalities. - Left atrium: The atrium was mildly dilated.  Pt was being followed by Dr. Sherryl MangesSteven Klein at the EP cardiology clinic for tachycardia. Treated with various CV meds until pt's nortriptyline (for migraines) was discontinued and pt's tachycardia resolved. No longer on any med to control HR. Last office visit with Uvaldo RisingA. Seiler, NP was 03/13/15, follow up recommended with EP clinic in 1 year. Pt also saw Dr. Everardo AllEllison with endocrinology for tachycardia, no adrenal problems identified.   If no changes, I anticipate pt can proceed with surgery as scheduled.   Rica Mastngela Kaneshia Cater, FNP-BC [facility]/Anesthesiology Phone: [phone] 03/26/2015 2:13 PM

## 2015-03-26 NOTE — Telephone Encounter (Signed)
I called in script and spoke with pt. 

## 2015-03-26 NOTE — Telephone Encounter (Signed)
Because the pharmacy NEVER sent us such a fax! Anyway call in Xanax #[phone] with 5 rf

## 2015-03-27 ENCOUNTER — Other Ambulatory Visit: Payer: BLUE CROSS/BLUE SHIELD

## 2015-03-27 ENCOUNTER — Other Ambulatory Visit (INDEPENDENT_AMBULATORY_CARE_PROVIDER_SITE_OTHER): Payer: BLUE CROSS/BLUE SHIELD

## 2015-03-27 DIAGNOSIS — E785 Hyperlipidemia, unspecified: Secondary | ICD-10-CM | POA: Diagnosis not present

## 2015-03-27 LAB — LIPID PANEL
Cholesterol: 224 mg/dL — ABNORMAL HIGH (ref 0–200)
HDL: 36.3 mg/dL — ABNORMAL LOW (ref >39.00)
NonHDL: 188.03
Total CHOL/HDL Ratio: 6
Triglycerides: 238 mg/dL — ABNORMAL HIGH (ref 0.0–149.0)
VLDL: 47.6 mg/dL — ABNORMAL HIGH (ref 0.0–40.0)

## 2015-03-27 LAB — LDL CHOLESTEROL, DIRECT: Direct LDL: 162 mg/dL

## 2015-03-28 ENCOUNTER — Encounter: Payer: Self-pay | Admitting: Family Medicine

## 2015-03-29 NOTE — Telephone Encounter (Signed)
Yes I recommend she start on a medication to get the lipids under control. Call in Lipitor 20 mg daily, 90 day supply. And recheck a lipid panel and liver panel in 90 days

## 2015-03-31 MED ORDER — CEFAZOLIN SODIUM-DEXTROSE 2-3 GM-% IV SOLR
2.0000 g | INTRAVENOUS | Status: DC
  Filled 2015-03-31: qty 50

## 2015-04-01 ENCOUNTER — Inpatient Hospital Stay (HOSPITAL_COMMUNITY): Payer: BLUE CROSS/BLUE SHIELD | Admitting: Emergency Medicine

## 2015-04-01 ENCOUNTER — Inpatient Hospital Stay (HOSPITAL_COMMUNITY): Payer: BLUE CROSS/BLUE SHIELD

## 2015-04-01 ENCOUNTER — Inpatient Hospital Stay (HOSPITAL_COMMUNITY)
Admission: RE | Admit: 2015-04-01 | Discharge: 2015-04-10 | DRG: 460 | Disposition: A | Discharge status: Home Health | Payer: BLUE CROSS/BLUE SHIELD | Source: Ambulatory Visit | Attending: Neurosurgery | Admitting: Neurosurgery

## 2015-04-01 ENCOUNTER — Encounter (HOSPITAL_COMMUNITY): Payer: Self-pay | Admitting: Certified Registered Nurse Anesthetist

## 2015-04-01 ENCOUNTER — Inpatient Hospital Stay (HOSPITAL_COMMUNITY): Payer: BLUE CROSS/BLUE SHIELD | Admitting: Certified Registered Nurse Anesthetist

## 2015-04-01 ENCOUNTER — Encounter (HOSPITAL_COMMUNITY)
Admission: RE | Disposition: A | Discharge status: Home Health | Payer: BLUE CROSS/BLUE SHIELD | Source: Ambulatory Visit | Attending: Neurosurgery

## 2015-04-01 DIAGNOSIS — H6120 Impacted cerumen, unspecified ear: Secondary | ICD-10-CM

## 2015-04-01 DIAGNOSIS — L708 Other acne: Secondary | ICD-10-CM

## 2015-04-01 DIAGNOSIS — M79605 Pain in left leg: Secondary | ICD-10-CM | POA: Diagnosis present

## 2015-04-01 DIAGNOSIS — Z419 Encounter for procedure for purposes other than remedying health state, unspecified: Secondary | ICD-10-CM

## 2015-04-01 DIAGNOSIS — M4806 Spinal stenosis, lumbar region: Secondary | ICD-10-CM | POA: Diagnosis present

## 2015-04-01 DIAGNOSIS — Z981 Arthrodesis status: Secondary | ICD-10-CM | POA: Diagnosis not present

## 2015-04-01 DIAGNOSIS — Z87442 Personal history of urinary calculi: Secondary | ICD-10-CM

## 2015-04-01 DIAGNOSIS — R339 Retention of urine, unspecified: Secondary | ICD-10-CM

## 2015-04-01 DIAGNOSIS — K648 Other hemorrhoids: Secondary | ICD-10-CM

## 2015-04-01 DIAGNOSIS — G43709 Chronic migraine without aura, not intractable, without status migrainosus: Secondary | ICD-10-CM

## 2015-04-01 DIAGNOSIS — M5432 Sciatica, left side: Secondary | ICD-10-CM

## 2015-04-01 DIAGNOSIS — R51 Headache: Secondary | ICD-10-CM | POA: Diagnosis not present

## 2015-04-01 DIAGNOSIS — F411 Generalized anxiety disorder: Secondary | ICD-10-CM

## 2015-04-01 DIAGNOSIS — I10 Essential (primary) hypertension: Secondary | ICD-10-CM

## 2015-04-01 DIAGNOSIS — R509 Fever, unspecified: Secondary | ICD-10-CM

## 2015-04-01 DIAGNOSIS — M545 Low back pain: Secondary | ICD-10-CM

## 2015-04-01 DIAGNOSIS — R000 Tachycardia, unspecified: Secondary | ICD-10-CM

## 2015-04-01 DIAGNOSIS — L659 Nonscarring hair loss, unspecified: Secondary | ICD-10-CM

## 2015-04-01 DIAGNOSIS — J0110 Acute frontal sinusitis, unspecified: Secondary | ICD-10-CM

## 2015-04-01 DIAGNOSIS — R7989 Other specified abnormal findings of blood chemistry: Secondary | ICD-10-CM

## 2015-04-01 DIAGNOSIS — K219 Gastro-esophageal reflux disease without esophagitis: Secondary | ICD-10-CM

## 2015-04-01 DIAGNOSIS — G4700 Insomnia, unspecified: Secondary | ICD-10-CM

## 2015-04-01 DIAGNOSIS — M48061 Spinal stenosis, lumbar region without neurogenic claudication: Secondary | ICD-10-CM

## 2015-04-01 SURGERY — POSTERIOR LUMBAR FUSION 2 LEVEL
Anesthesia: General | Site: Back

## 2015-04-01 MED ORDER — CEFAZOLIN SODIUM 10 G IJ SOLR
3.0000 g | INTRAMUSCULAR | Status: DC | PRN
  Administered 2015-04-01 (×2): 3 g via INTRAVENOUS

## 2015-04-01 MED ORDER — MIDAZOLAM HCL 2 MG/2ML IJ SOLN
INTRAMUSCULAR | Status: AC
  Filled 2015-04-01: qty 4

## 2015-04-01 MED ORDER — HYDROMORPHONE HCL 1 MG/ML IJ SOLN
INTRAMUSCULAR | Status: AC
  Filled 2015-04-01: qty 2

## 2015-04-01 MED ORDER — CYCLOBENZAPRINE HCL 10 MG PO TABS
ORAL_TABLET | ORAL | Status: AC
  Filled 2015-04-01: qty 1

## 2015-04-01 MED ORDER — OXYCODONE-ACETAMINOPHEN 10-325 MG PO TABS
1.0000 | ORAL_TABLET | Freq: Four times a day (QID) | ORAL | Status: DC | PRN
  Filled 2015-04-01: qty 1

## 2015-04-01 MED ORDER — ACETAMINOPHEN 650 MG RE SUPP
650.0000 mg | RECTAL | Status: DC | PRN
Start: 2015-04-01 — End: 2015-04-10

## 2015-04-01 MED ORDER — MEPERIDINE HCL 25 MG/ML IJ SOLN: 6.2500 mg | INTRAMUSCULAR | Status: DC | PRN

## 2015-04-01 MED ORDER — CYCLOBENZAPRINE HCL 10 MG PO TABS: 10.0000 mg | ORAL_TABLET | Freq: Three times a day (TID) | ORAL | Status: DC | PRN

## 2015-04-01 MED ORDER — ROCURONIUM BROMIDE 50 MG/5ML IV SOLN
INTRAVENOUS | Status: AC
  Filled 2015-04-01: qty 1

## 2015-04-01 MED ORDER — SODIUM CHLORIDE 0.9 % IV SOLN
250.0000 mL | INTRAVENOUS | Status: DC
  Administered 2015-04-03: 250 mL via INTRAVENOUS

## 2015-04-01 MED ORDER — SUGAMMADEX SODIUM 500 MG/5ML IV SOLN
INTRAVENOUS | Status: AC
  Filled 2015-04-01: qty 5

## 2015-04-01 MED ORDER — FENTANYL CITRATE (PF) 250 MCG/5ML IJ SOLN
INTRAMUSCULAR | Status: AC
  Filled 2015-04-01: qty 5

## 2015-04-01 MED ORDER — OXYCODONE HCL 5 MG PO TABS
ORAL_TABLET | ORAL | Status: AC
  Administered 2015-04-01: 10 mg
  Filled 2015-04-01: qty 2

## 2015-04-01 MED ORDER — VANCOMYCIN HCL 1000 MG IV SOLR
INTRAVENOUS | Status: DC | PRN
  Administered 2015-04-01: 1000 mg

## 2015-04-01 MED ORDER — SODIUM CHLORIDE 0.9 % IR SOLN
Status: DC | PRN
  Administered 2015-04-01: 10:00:00

## 2015-04-01 MED ORDER — CALCIUM CARBONATE ANTACID 500 MG PO CHEW
1.0000 | CHEWABLE_TABLET | Freq: Two times a day (BID) | ORAL | Status: DC | PRN
  Filled 2015-04-01: qty 1

## 2015-04-01 MED ORDER — DEXMEDETOMIDINE HCL IN NACL 200 MCG/50ML IV SOLN
INTRAVENOUS | Status: AC
  Filled 2015-04-01: qty 50

## 2015-04-01 MED ORDER — PANTOPRAZOLE SODIUM 40 MG PO TBEC
40.0000 mg | DELAYED_RELEASE_TABLET | Freq: Two times a day (BID) | ORAL | Status: DC
  Administered 2015-04-01: 40 mg via ORAL
  Filled 2015-04-01: qty 1

## 2015-04-01 MED ORDER — ONDANSETRON HCL 4 MG/2ML IJ SOLN
INTRAMUSCULAR | Status: DC | PRN
  Administered 2015-04-01: 4 mg via INTRAVENOUS

## 2015-04-01 MED ORDER — DEXMEDETOMIDINE HCL IN NACL 200 MCG/50ML IV SOLN
INTRAVENOUS | Status: DC | PRN
  Administered 2015-04-01 (×15): 8 ug via INTRAVENOUS

## 2015-04-01 MED ORDER — ACETAMINOPHEN 10 MG/ML IV SOLN
INTRAVENOUS | Status: AC
  Administered 2015-04-01: 1000 mg via INTRAVENOUS
  Filled 2015-04-01: qty 100

## 2015-04-01 MED ORDER — 0.9 % SODIUM CHLORIDE (POUR BTL) OPTIME
TOPICAL | Status: DC | PRN
  Administered 2015-04-01: 1000 mL

## 2015-04-01 MED ORDER — FENTANYL CITRATE (PF) 100 MCG/2ML IJ SOLN
INTRAMUSCULAR | Status: DC | PRN
  Administered 2015-04-01: 50 ug via INTRAVENOUS
  Administered 2015-04-01 (×2): 100 ug via INTRAVENOUS
  Administered 2015-04-01 (×2): 50 ug via INTRAVENOUS

## 2015-04-01 MED ORDER — SODIUM CHLORIDE 0.9 % IJ SOLN
3.0000 mL | Freq: Two times a day (BID) | INTRAMUSCULAR | Status: DC
  Administered 2015-04-01 – 2015-04-09 (×11): 3 mL via INTRAVENOUS

## 2015-04-01 MED ORDER — CYCLOBENZAPRINE HCL 10 MG PO TABS
10.0000 mg | ORAL_TABLET | Freq: Three times a day (TID) | ORAL | Status: DC | PRN
  Administered 2015-04-01: 10 mg via ORAL
  Filled 2015-04-01 (×2): qty 1

## 2015-04-01 MED ORDER — MIDAZOLAM HCL 5 MG/5ML IJ SOLN
INTRAMUSCULAR | Status: DC | PRN
  Administered 2015-04-01: 2 mg via INTRAVENOUS

## 2015-04-01 MED ORDER — EPHEDRINE SULFATE 50 MG/ML IJ SOLN
INTRAMUSCULAR | Status: AC
  Filled 2015-04-01: qty 1

## 2015-04-01 MED ORDER — DEXAMETHASONE SODIUM PHOSPHATE 10 MG/ML IJ SOLN: 10.0000 mg | INTRAMUSCULAR | Status: DC

## 2015-04-01 MED ORDER — ROCURONIUM BROMIDE 100 MG/10ML IV SOLN
INTRAVENOUS | Status: DC | PRN
  Administered 2015-04-01: 10 mg via INTRAVENOUS
  Administered 2015-04-01 (×2): 50 mg via INTRAVENOUS
  Administered 2015-04-01: 40 mg via INTRAVENOUS
  Administered 2015-04-01: 10 mg via INTRAVENOUS

## 2015-04-01 MED ORDER — PROMETHAZINE HCL 25 MG PO TABS
25.0000 mg | ORAL_TABLET | Freq: Four times a day (QID) | ORAL | Status: DC | PRN
  Administered 2015-04-08 – 2015-04-09 (×2): 25 mg via ORAL
  Filled 2015-04-01 (×2): qty 1

## 2015-04-01 MED ORDER — ALPRAZOLAM 0.5 MG PO TABS
0.5000 mg | ORAL_TABLET | Freq: Three times a day (TID) | ORAL | Status: DC | PRN
  Administered 2015-04-01 – 2015-04-03 (×4): 0.5 mg via ORAL
  Filled 2015-04-01 (×4): qty 1

## 2015-04-01 MED ORDER — MENTHOL 3 MG MT LOZG
1.0000 | LOZENGE | OROMUCOSAL | Status: DC | PRN
Start: 2015-04-01 — End: 2015-04-10

## 2015-04-01 MED ORDER — ONDANSETRON HCL 4 MG/2ML IJ SOLN
4.0000 mg | INTRAMUSCULAR | Status: DC | PRN
  Administered 2015-04-01 – 2015-04-10 (×17): 4 mg via INTRAVENOUS
  Filled 2015-04-01 (×17): qty 2

## 2015-04-01 MED ORDER — POLYETHYLENE GLYCOL 3350 17 G PO PACK: 17.0000 g | PACK | Freq: Every day | ORAL | Status: DC | PRN

## 2015-04-01 MED ORDER — HYDROMORPHONE HCL 1 MG/ML IJ SOLN
INTRAMUSCULAR | Status: AC
  Filled 2015-04-01: qty 1

## 2015-04-01 MED ORDER — HYDROMORPHONE HCL 1 MG/ML IJ SOLN
0.2500 mg | INTRAMUSCULAR | Status: AC | PRN
  Administered 2015-04-01 (×8): 0.5 mg via INTRAVENOUS

## 2015-04-01 MED ORDER — SUCCINYLCHOLINE CHLORIDE 20 MG/ML IJ SOLN
INTRAMUSCULAR | Status: DC | PRN
  Administered 2015-04-01: 100 mg via INTRAVENOUS

## 2015-04-01 MED ORDER — OXYCODONE-ACETAMINOPHEN 5-325 MG PO TABS
2.0000 | ORAL_TABLET | ORAL | Status: DC | PRN
  Administered 2015-04-01 – 2015-04-02 (×2): 2 via ORAL
  Filled 2015-04-01 (×2): qty 2

## 2015-04-01 MED ORDER — FLUTICASONE PROPIONATE 50 MCG/ACT NA SUSP
2.0000 | Freq: Every day | NASAL | Status: DC
  Administered 2015-04-02 – 2015-04-10 (×8): 2 via NASAL
  Filled 2015-04-01 (×3): qty 16

## 2015-04-01 MED ORDER — VITAMIN D 1000 UNITS PO TABS
2000.0000 [IU] | ORAL_TABLET | Freq: Every day | ORAL | Status: DC
  Administered 2015-04-01 – 2015-04-10 (×10): 2000 [IU] via ORAL
  Filled 2015-04-01 (×10): qty 2

## 2015-04-01 MED ORDER — CEFAZOLIN SODIUM-DEXTROSE 2-3 GM-% IV SOLR
2.0000 g | Freq: Four times a day (QID) | INTRAVENOUS | Status: AC
  Administered 2015-04-01 – 2015-04-03 (×8): 2 g via INTRAVENOUS
  Filled 2015-04-01 (×10): qty 50

## 2015-04-01 MED ORDER — DOCUSATE SODIUM 100 MG PO CAPS
100.0000 mg | ORAL_CAPSULE | Freq: Two times a day (BID) | ORAL | Status: DC
  Administered 2015-04-01 – 2015-04-10 (×18): 100 mg via ORAL
  Filled 2015-04-01 (×18): qty 1

## 2015-04-01 MED ORDER — SODIUM CHLORIDE 0.9 % IJ SOLN
3.0000 mL | INTRAMUSCULAR | Status: DC | PRN
  Administered 2015-04-04: 3 mL via INTRAVENOUS
  Filled 2015-04-01: qty 3

## 2015-04-01 MED ORDER — VANCOMYCIN HCL 1000 MG IV SOLR
INTRAVENOUS | Status: AC
  Filled 2015-04-01: qty 1000

## 2015-04-01 MED ORDER — BUPIVACAINE LIPOSOME 1.3 % IJ SUSP
20.0000 mL | Freq: Once | INTRAMUSCULAR | Status: DC
  Filled 2015-04-01: qty 20

## 2015-04-01 MED ORDER — HYDROCHLOROTHIAZIDE 25 MG PO TABS
25.0000 mg | ORAL_TABLET | Freq: Every day | ORAL | Status: DC
  Administered 2015-04-01 – 2015-04-10 (×9): 25 mg via ORAL
  Filled 2015-04-01 (×9): qty 1

## 2015-04-01 MED ORDER — SIMETHICONE 80 MG PO CHEW
160.0000 mg | CHEWABLE_TABLET | Freq: Four times a day (QID) | ORAL | Status: DC | PRN
  Filled 2015-04-01 (×2): qty 2

## 2015-04-01 MED ORDER — DIAZEPAM 5 MG PO TABS
5.0000 mg | ORAL_TABLET | Freq: Four times a day (QID) | ORAL | Status: DC | PRN
  Administered 2015-04-01: 5 mg via ORAL
  Administered 2015-04-02 – 2015-04-04 (×8): 10 mg via ORAL
  Administered 2015-04-04 (×2): 5 mg via ORAL
  Administered 2015-04-04: 10 mg via ORAL
  Administered 2015-04-04: 5 mg via ORAL
  Administered 2015-04-05 – 2015-04-06 (×4): 10 mg via ORAL
  Administered 2015-04-06: 5 mg via ORAL
  Administered 2015-04-06 (×2): 10 mg via ORAL
  Administered 2015-04-06: 5 mg via ORAL
  Administered 2015-04-07 (×2): 10 mg via ORAL
  Administered 2015-04-07: 5 mg via ORAL
  Administered 2015-04-07 – 2015-04-10 (×7): 10 mg via ORAL
  Filled 2015-04-01: qty 1
  Filled 2015-04-01 (×2): qty 2
  Filled 2015-04-01 (×3): qty 1
  Filled 2015-04-01 (×9): qty 2
  Filled 2015-04-01: qty 1
  Filled 2015-04-01 (×14): qty 2
  Filled 2015-04-01: qty 1

## 2015-04-01 MED ORDER — DEXAMETHASONE SODIUM PHOSPHATE 4 MG/ML IJ SOLN
INTRAMUSCULAR | Status: AC
  Filled 2015-04-01: qty 1

## 2015-04-01 MED ORDER — SUGAMMADEX SODIUM 200 MG/2ML IV SOLN
INTRAVENOUS | Status: DC | PRN
  Administered 2015-04-01: 500 mg via INTRAVENOUS

## 2015-04-01 MED ORDER — LACTATED RINGERS IV SOLN
INTRAVENOUS | Status: DC
  Administered 2015-04-01 (×3): via INTRAVENOUS

## 2015-04-01 MED ORDER — SUCRALFATE 1 GM/10ML PO SUSP
1.0000 g | Freq: Three times a day (TID) | ORAL | Status: DC
  Administered 2015-04-02 – 2015-04-10 (×30): 1 g via ORAL
  Filled 2015-04-01 (×36): qty 10

## 2015-04-01 MED ORDER — PROPOFOL 10 MG/ML IV BOLUS
INTRAVENOUS | Status: DC | PRN
  Administered 2015-04-01: 200 mg via INTRAVENOUS

## 2015-04-01 MED ORDER — SURGIFOAM 100 EX MISC
CUTANEOUS | Status: DC | PRN
  Administered 2015-04-01 (×2): via TOPICAL

## 2015-04-01 MED ORDER — DEXAMETHASONE SODIUM PHOSPHATE 4 MG/ML IJ SOLN
INTRAMUSCULAR | Status: DC | PRN
  Administered 2015-04-01: 4 mg via INTRAVENOUS

## 2015-04-01 MED ORDER — PROPOFOL 10 MG/ML IV BOLUS
INTRAVENOUS | Status: AC
  Filled 2015-04-01: qty 20

## 2015-04-01 MED ORDER — ALUM & MAG HYDROXIDE-SIMETH 200-200-20 MG/5ML PO SUSP: 30.0000 mL | Freq: Four times a day (QID) | ORAL | Status: DC | PRN

## 2015-04-01 MED ORDER — DEXAMETHASONE SODIUM PHOSPHATE 10 MG/ML IJ SOLN
INTRAMUSCULAR | Status: AC
  Filled 2015-04-01: qty 1

## 2015-04-01 MED ORDER — PROMETHAZINE HCL 25 MG/ML IJ SOLN: 6.2500 mg | INTRAMUSCULAR | Status: DC | PRN

## 2015-04-01 MED ORDER — ADULT MULTIVITAMIN W/MINERALS CH
1.0000 | ORAL_TABLET | Freq: Every day | ORAL | Status: DC
  Administered 2015-04-02 – 2015-04-08 (×7): 1 via ORAL
  Filled 2015-04-01 (×8): qty 1

## 2015-04-01 MED ORDER — PHENOL 1.4 % MT LIQD: 1.0000 | OROMUCOSAL | Status: DC | PRN

## 2015-04-01 MED ORDER — SUCCINYLCHOLINE CHLORIDE 20 MG/ML IJ SOLN
INTRAMUSCULAR | Status: AC
  Filled 2015-04-01: qty 1

## 2015-04-01 MED ORDER — HYDROMORPHONE HCL 1 MG/ML IJ SOLN
0.5000 mg | INTRAMUSCULAR | Status: DC | PRN
  Administered 2015-04-01 – 2015-04-09 (×42): 1 mg via INTRAVENOUS
  Filled 2015-04-01 (×43): qty 1

## 2015-04-01 MED ORDER — GABAPENTIN 300 MG PO CAPS
300.0000 mg | ORAL_CAPSULE | Freq: Three times a day (TID) | ORAL | Status: DC
  Administered 2015-04-01 – 2015-04-10 (×27): 300 mg via ORAL
  Filled 2015-04-01 (×29): qty 1

## 2015-04-01 MED ORDER — ACETAMINOPHEN 325 MG PO TABS
650.0000 mg | ORAL_TABLET | ORAL | Status: DC | PRN
  Administered 2015-04-04 – 2015-04-10 (×4): 650 mg via ORAL
  Filled 2015-04-01 (×4): qty 2

## 2015-04-01 MED ORDER — OXYCODONE-ACETAMINOPHEN 5-325 MG PO TABS
ORAL_TABLET | ORAL | Status: DC
Start: 2015-04-01 — End: 2015-04-01
  Filled 2015-04-01: qty 1

## 2015-04-01 MED ORDER — STERILE WATER FOR INJECTION IJ SOLN
INTRAMUSCULAR | Status: AC
  Filled 2015-04-01: qty 10

## 2015-04-01 MED ORDER — LIDOCAINE-EPINEPHRINE 1 %-1:100000 IJ SOLN
INTRAMUSCULAR | Status: DC | PRN
  Administered 2015-04-01: 10 mL

## 2015-04-01 SURGICAL SUPPLY — 83 items
BAG DECANTER FOR FLEXI CONT (MISCELLANEOUS) ×2 IMPLANT
BENZOIN TINCTURE PRP APPL 2/3 (GAUZE/BANDAGES/DRESSINGS) ×2 IMPLANT
BIT DRILL 5.0/4.0 (BIT) ×1 IMPLANT
BLADE CLIPPER SURG (BLADE) IMPLANT
BLADE SURG 11 STRL SS (BLADE) ×2 IMPLANT
BONE ALLOSTEM MORSELIZED 5CC (Bone Implant) ×4 IMPLANT
BRUSH SCRUB EZ PLAIN DRY (MISCELLANEOUS) ×2 IMPLANT
BUR MATCHSTICK NEURO 3.0 LAGG (BURR) ×2 IMPLANT
BUR PRECISION FLUTE 6.0 (BURR) ×2 IMPLANT
CAGE RISE 11-17-15 10X26 (Cage) ×4 IMPLANT
CANISTER SUCT 3000ML PPV (MISCELLANEOUS) ×2 IMPLANT
CAP LOCKING (Cap) ×6 IMPLANT
CAP LOCKING 5.5 CREO (Cap) ×6 IMPLANT
CONT SPEC 4OZ CLIKSEAL STRL BL (MISCELLANEOUS) ×2 IMPLANT
COVER BACK TABLE 60X90IN (DRAPES) ×2 IMPLANT
COVER TABLE BACK 60X90 (DRAPES) ×4 IMPLANT
DECANTER SPIKE VIAL GLASS SM (MISCELLANEOUS) ×2 IMPLANT
DRAPE C-ARM 42X72 X-RAY (DRAPES) ×2 IMPLANT
DRAPE C-ARMOR (DRAPES) ×2 IMPLANT
DRAPE LAPAROTOMY 100X72X124 (DRAPES) ×2 IMPLANT
DRAPE POUCH INSTRU U-SHP 10X18 (DRAPES) ×2 IMPLANT
DRAPE PROXIMA HALF (DRAPES) IMPLANT
DRAPE SURG 17X23 STRL (DRAPES) ×2 IMPLANT
DRILL 5.0/4.0 (BIT) ×2
DRSG OPSITE 4X5.5 SM (GAUZE/BANDAGES/DRESSINGS) ×2 IMPLANT
DRSG OPSITE POSTOP 4X8 (GAUZE/BANDAGES/DRESSINGS) ×2 IMPLANT
DURAPREP 26ML APPLICATOR (WOUND CARE) ×2 IMPLANT
DURASEAL APPLICATOR TIP (TIP) ×2 IMPLANT
DURASEAL SPINE SEALANT 3ML (MISCELLANEOUS) ×2 IMPLANT
ELECT BLADE 4.0 EZ CLEAN MEGAD (MISCELLANEOUS) ×2
ELECT REM PT RETURN 9FT ADLT (ELECTROSURGICAL) ×2
ELECTRODE BLDE 4.0 EZ CLN MEGD (MISCELLANEOUS) ×1 IMPLANT
ELECTRODE REM PT RTRN 9FT ADLT (ELECTROSURGICAL) ×1 IMPLANT
EVACUATOR 3/16  PVC DRAIN (DRAIN) ×1
EVACUATOR 3/16 PVC DRAIN (DRAIN) ×1 IMPLANT
GAUZE SPONGE 4X4 12PLY STRL (GAUZE/BANDAGES/DRESSINGS) ×2 IMPLANT
GAUZE SPONGE 4X4 16PLY XRAY LF (GAUZE/BANDAGES/DRESSINGS) ×2 IMPLANT
GLOVE BIO SURGEON STRL SZ8 (GLOVE) ×4 IMPLANT
GLOVE ECLIPSE 9.0 STRL (GLOVE) ×2 IMPLANT
GLOVE EXAM NITRILE LRG STRL (GLOVE) IMPLANT
GLOVE EXAM NITRILE MD LF STRL (GLOVE) IMPLANT
GLOVE EXAM NITRILE XL STR (GLOVE) IMPLANT
GLOVE EXAM NITRILE XS STR PU (GLOVE) IMPLANT
GLOVE INDICATOR 7.5 STRL GRN (GLOVE) ×10 IMPLANT
GLOVE INDICATOR 8.5 STRL (GLOVE) ×4 IMPLANT
GLOVE SURG SS PI 7.0 STRL IVOR (GLOVE) ×8 IMPLANT
GOWN STRL REUS W/ TWL LRG LVL3 (GOWN DISPOSABLE) IMPLANT
GOWN STRL REUS W/ TWL XL LVL3 (GOWN DISPOSABLE) ×4 IMPLANT
GOWN STRL REUS W/TWL 2XL LVL3 (GOWN DISPOSABLE) IMPLANT
GOWN STRL REUS W/TWL LRG LVL3 (GOWN DISPOSABLE)
GOWN STRL REUS W/TWL XL LVL3 (GOWN DISPOSABLE) ×4
GRAFT DURAGEN MATRIX 1WX1L (Tissue) ×2 IMPLANT
IMPL SPINE RISE 8-15-10-26M (Neuro Prosthesis/Implant) ×2 IMPLANT
IMPLANT SPINE RISE 8-15-10-26M (Neuro Prosthesis/Implant) ×4 IMPLANT
KIT BASIN OR (CUSTOM PROCEDURE TRAY) ×2 IMPLANT
KIT ROOM TURNOVER OR (KITS) ×2 IMPLANT
LIQUID BAND (GAUZE/BANDAGES/DRESSINGS) ×2 IMPLANT
NEEDLE HYPO 21X1.5 SAFETY (NEEDLE) ×2 IMPLANT
NEEDLE HYPO 25X1 1.5 SAFETY (NEEDLE) ×2 IMPLANT
NS IRRIG 1000ML POUR BTL (IV SOLUTION) ×2 IMPLANT
PACK LAMINECTOMY NEURO (CUSTOM PROCEDURE TRAY) ×2 IMPLANT
PAD ARMBOARD 7.5X6 YLW CONV (MISCELLANEOUS) ×6 IMPLANT
PATTIES SURGICAL .5 X.5 (GAUZE/BANDAGES/DRESSINGS) ×2 IMPLANT
PATTIES SURGICAL 1X1 (DISPOSABLE) ×2 IMPLANT
ROD 65MM SPINAL (Rod) ×2 IMPLANT
ROD SPNL 5.5 CREO TI 65 (Rod) ×2 IMPLANT
SCREW MOD 6.0-5.0X35MM (Screw) ×4 IMPLANT
SCREW PREASSEMBLY 6.0-5.0X35 (Screw) ×8 IMPLANT
SPONGE LAP 4X18 X RAY DECT (DISPOSABLE) IMPLANT
SPONGE SURGIFOAM ABS GEL 100 (HEMOSTASIS) ×4 IMPLANT
STRIP CLOSURE SKIN 1/2X4 (GAUZE/BANDAGES/DRESSINGS) ×2 IMPLANT
SUT PROLENE 6 0 BV (SUTURE) ×2 IMPLANT
SUT VIC AB 0 CT1 18XCR BRD8 (SUTURE) ×1 IMPLANT
SUT VIC AB 0 CT1 8-18 (SUTURE) ×1
SUT VIC AB 2-0 CT1 18 (SUTURE) ×4 IMPLANT
SUT VIC AB 4-0 PS2 27 (SUTURE) ×2 IMPLANT
SUT VICRYL 4-0 PS2 18IN ABS (SUTURE) IMPLANT
SYR 20CC LL (SYRINGE) ×2 IMPLANT
TOWEL OR 17X24 6PK STRL BLUE (TOWEL DISPOSABLE) ×2 IMPLANT
TOWEL OR 17X26 10 PK STRL BLUE (TOWEL DISPOSABLE) ×2 IMPLANT
TRAY FOLEY W/METER SILVER 14FR (SET/KITS/TRAYS/PACK) ×2 IMPLANT
TULIP CREP AMP 5.5MM (Orthopedic Implant) ×4 IMPLANT
WATER STERILE IRR 1000ML POUR (IV SOLUTION) ×2 IMPLANT

## 2015-04-01 NOTE — Transfer of Care (Signed)
Immediate Anesthesia Transfer of Care Note  Patient: [name]  Procedure(s) Performed: Procedure(s): POSTERIOR LUMBAR INTERBODY FUSION LUMBAR THREE-FOUR, LUMBAR FOUR-FIVE (N/A)  Patient Location: PACU  Anesthesia Type:General  Level of Consciousness: awake, alert  and oriented  Airway & Oxygen Therapy: Patient Spontanous Breathing and Patient connected to nasal cannula oxygen  Post-op Assessment: Report given to RN and Post -op Vital signs reviewed and stable  Post vital signs: Reviewed and stable  Last Vitals:  Filed Vitals:   04/01/15 1438  BP:   Pulse:   Temp: 36.6 C  Resp:     Complications: No apparent anesthesia complications

## 2015-04-01 NOTE — H&P (Signed)
[name] is an 39 y.o. female.   Chief Complaint: Back and left leg pain HPI: Patient is a very pleasant 39 year old female has had long-standing back and left leg pain is undergone 2 previous laminectomies and discectomies at L3-4 and L4-5 respectively initially did very well however is gotten progressively worse with back pain greater than leg pain and persistent left L4 and L5 radiculopathies. Workup has revealed progressive degenerative collapse severe stenosis and recurrent disc bulges at both levels. Due to patient progressive clinical syndrome imaging findings and failure conservative treatment I recommended interbody fusions at L3-4 and L4-5. I have extensively reviewed the risks and benefits of the operation with the patient as well as perioperative course expectations of outcome and alternatives of surgery she understands and agrees to proceed forward.  Past Medical History  Diagnosis Date  . Allergy   . GERD (gastroesophageal reflux disease)   . Polycystic ovaries   . B12 deficiency   . Peripheral neuropathy (HCC)   . Fibromyalgia   . Depression   . Migraine   . Anxiety   . Intervertebral disc protrusion 01/11/2014  . Dysrhythmia     hx tachy  . Esophagitis     rx  . History of kidney stones     Past Surgical History  Procedure Laterality Date  . Appendectomy    . Vaginal hysterectomy    . Tonsillectomy    . Sinusotomy  12/14/06  . Wrist ganglion excision Left   . Ovarian cyst removal    . Oophorectomy      left overy  . Rectocele repair    . Bladder tack  2012  . Back surgery  2012    left laminectomy at L3-4 per Dr. Phoebe PerchHirsch   . Lumbar disc surgery  01-31-14    per Dr. Wynetta Emeryram     Family History  Problem Relation Age of Onset  . Fibromyalgia Mother   . Diabetes Mother   . Thyroid cancer Mother   . Liver disease Mother   . Neuropathy Mother   . Hypertension Father   . Diabetes Father   . Heart disease Father   . Colon cancer Neg Hx   . Prostate cancer  Father   . Cirrhosis Mother     non-alcoholic  . Pulmonary embolism Mother     both lungs  . Heart attack Father     x 2  . Other Neg Hx     pheochromocytoma   Social History:  reports that she quit smoking about 16 years ago. Her smoking use included Cigarettes. She has never used smokeless tobacco. She reports that she does not drink alcohol or use illicit drugs.  Allergies:  Allergies  Allergen Reactions  . Gadolinium Derivatives Swelling and Other (See Comments)    Arm swelling, tingling, & redness. Radiologist Dr. Grace IsaacWatts recommends an injection of steroids if patient needs a gadolinium based contrast in the future.    Medications Prior to Admission  Medication Sig Dispense Refill  . ALPRAZolam (XANAX) 0.5 MG tablet Take 1 tablet (0.5 mg total) by mouth 3 (three) times daily as needed for anxiety. 90 tablet 5  . BD INTEGRA SYRINGE 25G X 1" 3 ML MISC USE AS DIRECTED 50 each 1  . Cholecalciferol (VITAMIN D3) 2000 UNITS TABS Take 1 tablet by mouth daily.    . cyanocobalamin (,VITAMIN B-12,) 1000 MCG/ML injection INJECT 1 ML (1,000 MCG TOTAL) INTO THE MUSCLE EVERY 7 DAYS. 12 mL 3  . cyclobenzaprine (FLEXERIL) 10  MG tablet Take 10 mg by mouth every 8 (eight) hours as needed for muscle spasms. PATIENT TAKES FOR MUSCLE SPASMS    . fluticasone (FLONASE) 50 MCG/ACT nasal spray Place 2 sprays into both nostrils daily.    Marland Kitchen gabapentin (NEURONTIN) 300 MG capsule Take 1 capsule (300 mg total) by mouth 3 (three) times daily. (Patient taking differently: Take 300 mg by mouth 2 (two) times daily. ) 90 capsule 5  . hydrochlorothiazide (HYDRODIURIL) 25 MG tablet TAKE 1 TABLET BY MOUTH EVERY DAY 90 tablet 3  . Multiple Vitamins-Minerals (MULTI-VITAMIN GUMMIES PO) Take 2 tablets by mouth daily.     Marland Kitchen oxyCODONE-acetaminophen (PERCOCET) 10-325 MG per tablet Take 1 tablet by mouth every 6 (six) hours as needed for pain. 60 tablet 0  . pantoprazole (PROTONIX) 40 MG tablet TAKE 1 TABLET BY MOUTH TWICE A DAY  180 tablet 1  . promethazine (PHENERGAN) 25 MG tablet Take 1 tablet (25 mg total) by mouth every 6 (six) hours as needed for nausea. PATIENT TAKES FOR NAUSEA 90 tablet 5  . simethicone (MYLICON) 80 MG chewable tablet Chew 160 mg by mouth every 6 (six) hours as needed (gas pain).    . sucralfate (CARAFATE) 1 GM/10ML suspension Take 10 mLs (1 g total) by mouth 4 (four) times daily -  with meals and at bedtime. (Patient taking differently: Take 1 g by mouth 3 (three) times daily as needed. ) 420 mL 1  . SUMAtriptan (IMITREX) 50 MG tablet TAKE 1 TABLET BY MOUTH AT ONSET OF HEADACHE MAY REPEAT ONCE IN 2 HOURS IF PERSISTS OR RECURS 9 tablet 2  . calcium carbonate (TUMS - DOSED IN MG ELEMENTAL CALCIUM) 500 MG chewable tablet Chew 1 tablet by mouth 2 (two) times daily as needed for indigestion or heartburn.      No results found for this or any previous visit (from the past 48 hour(s)). No results found.  Review of Systems  Constitutional: Negative.   HENT: Negative.   Eyes: Negative.   Respiratory: Negative.   Cardiovascular: Negative.   Gastrointestinal: Negative.   Genitourinary: Negative.   Musculoskeletal: Positive for myalgias and back pain.  Skin: Negative.   Neurological: Positive for tingling and sensory change.  Psychiatric/Behavioral: Negative.     Blood pressure 138/107, pulse 99, temperature 98.4 F (36.9 C), temperature source Oral, resp. rate 16, height  (1.753 m), weight 122.471 kg (270 lb), last menstrual period 12/13/1999, SpO2 94 %. Physical Exam  Constitutional: She is oriented to person, place, and time. She appears well-developed and well-nourished.  HENT:  Head: Normocephalic.  Eyes: Pupils are equal, round, and reactive to light.  Neck: Normal range of motion.  Respiratory: Effort normal and breath sounds normal.  GI: Bowel sounds are normal.  Neurological: She is alert and oriented to person, place, and time. She has normal strength. GCS eye subscore is 4. GCS  verbal subscore is 5. GCS motor subscore is 6.  Strength is 5 out of 5 in her iliopsoas, quads, hip she's, gastric, and tibialis, and EHL.  Skin: Skin is warm and dry.     Assessment/Plan 39 year old female presents for decompression stabilization procedure at L3-4 and L4-5.  Laxmi Choung P 04/01/2015, 9:30 AM

## 2015-04-01 NOTE — Anesthesia Procedure Notes (Signed)
Procedure Name: Intubation Date/Time: 04/01/2015 9:45 AM Performed by: Reine JustFLOWERS, Goran Olden T Pre-anesthesia Checklist: Patient identified, Emergency Drugs available, Suction available, Patient being monitored and Timeout performed Patient Re-evaluated:Patient Re-evaluated prior to inductionOxygen Delivery Method: Circle system utilized and Simple face mask Preoxygenation: Pre-oxygenation with 100% oxygen Intubation Type: IV induction Ventilation: Mask ventilation without difficulty Laryngoscope Size: Mac and 4 Grade View: Grade I Tube type: Oral Tube size: 7.5 mm Number of attempts: 1 Airway Equipment and Method: Patient positioned with wedge pillow and Stylet Placement Confirmation: ETT inserted through vocal cords under direct vision,  positive ETCO2 and breath sounds checked- equal and bilateral Secured at: 23 cm Tube secured with: Tape Dental Injury: Teeth and Oropharynx as per pre-operative assessment

## 2015-04-01 NOTE — Anesthesia Preprocedure Evaluation (Addendum)
Anesthesia Evaluation  Patient identified by MRN, date of birth, ID band Patient awake    Reviewed: Allergy & Precautions, NPO status , Patient's Chart, lab work & pertinent test results  History of Anesthesia Complications (+) PONV  Airway Mallampati: II  TM Distance: >3 FB Neck ROM: Full    Dental no notable dental hx.    Pulmonary neg pulmonary ROS, former smoker,    Pulmonary exam normal breath sounds clear to auscultation       Cardiovascular hypertension, Pt. on medications Normal cardiovascular exam+ dysrhythmias  Rhythm:Regular Rate:Normal  Echo 07/2014 - Left ventricle: The cavity size was normal. Wall thickness wasnormal. Systolic function was normal. The estimated ejectionfraction was in the range of 60% to 65%. Wall motion was normal;there were no regional wall motion abnormalities. - Left atrium: The atrium was mildly dilated.    Neuro/Psych  Headaches, PSYCHIATRIC DISORDERS Anxiety Depression    GI/Hepatic Neg liver ROS, GERD  Medicated,  Endo/Other  Morbid obesity  Renal/GU negative Renal ROS     Musculoskeletal  (+) Arthritis , Fibromyalgia -  Abdominal (+) + obese,   Peds  Hematology negative hematology ROS (+)   Anesthesia Other Findings   Reproductive/Obstetrics negative OB ROS                            Anesthesia Physical Anesthesia Plan  ASA: III  Anesthesia Plan: General   Post-op Pain Management:    Induction: Intravenous  Airway Management Planned: Oral ETT  Additional Equipment: None  Intra-op Plan:   Post-operative Plan: Extubation in OR  Informed Consent: I have reviewed the patients History and Physical, chart, labs and discussed the procedure including the risks, benefits and alternatives for the proposed anesthesia with the patient or authorized representative who has indicated his/her understanding and acceptance.   Dental advisory  given  Plan Discussed with: CRNA  Anesthesia Plan Comments:         Anesthesia Quick Evaluation

## 2015-04-01 NOTE — Op Note (Signed)
Preoperative diagnosis: Lumbar spinal stenosis L3-4 L4-5 recurrent disc herniation L4-5 on the left degenerative scoliosis and severe degenerative disc disease with L4 and L5 radiculopathies  Postoperative diagnosis: Same  Procedure: Redo decompressive lumbar laminectomies L3-4 and L4-5 in excess and requiring more work to would be needed with a standard interbody fusion with complete facetectomies radical foraminotomies of the L3, L4, and L5 nerve roots bilaterally  #2 posterior lumbar interbody fusion L3-4 L4-5 using the globus rise expandable cage system packed with locally harvested autograft mixed with allostem morsels  #3 cortical screw fixation L3-L5 using the globusCreo modular cortical screws Set  Surgeon: Jillyn Hidden Cameran Ahmed  Asst.: Sherilyn Cooter pool  Anesthesia: Gen.  EBL: Minimal  History of present illness: Patient is a 39 year old female undergone previous L3-4 and L4-5 laminectomies and discectomies remotely. Patient is a progressive worsening back and left leg pain and workup has revealed recurrent discoloration L4-5 causing severe L4 and L5 nerve root and foraminal stenosis as well as marked degenerative collapse and degenerative scoliosis at L3-4 and L4-5. With severe lateral recess stenosis of both levels. Due to patient's failed conservative treatment imaging findings and progression of clinical syndrome I recommended decompression sterilization procedure I extensively went over the risks and benefits of the operation with the patient as well as perioperative course expectations of outcome and alternatives of surgery and she understood and agreed to proceed forward.   Operative procedure: Patient brought into the or was induced under general anesthesia positioned prone on the [facility] table back was prepped and draped in routine sterile fashion her old incision was opened up and extended cephalad caudally after infiltrating with a cc lidocaine with epi. Scar tissues dissected free and  subperiosteal dissections care lamina of L3, L4, and L5. Interoperative x-ray confirmed the location appropriate level side identified the pars and the entry points of both L3 pedicles using AP and lateral fluoroscopy pilot holes were drilled and then using the guide a drill 30 mm holes were drilled and L3 probed and tapped probed again into 60-50 by 35 mm screws were inserted at L3 bilaterally. At this point I removed the spinous process of L3 and L4 began central decompression first working on the right-sided never been operated on and perform the complete medial facetectomies radical foraminotomies the L3, L4 and L5 nerve roots there was a disc herniation on the right just underneath the L4 nerve root coming off the 45 disc space teased away with a nerve hook the then working on the left work into the scar tissue at both L3-4 and L4-5 with interbody the lateral dura identified the L3-L4 and L5 nerve roots in performed radical facetectomies and foraminotomies decompressing and exposing the lateral aspect the disc spaces of both placed. Dense scar tissue recurrent disc herniation on the left at L4-5 all this was teased off the dura dissecting it with gentle dissection. Laminotomy I identified a pinhole the dura that was well away from where was working at the time that looked like it may have been the sequela of an epidural injection is partially granulated but it was leaking a little bit of spinal fluid side over sewed primarily with 6-0 Prolene. Then working on both sides of a cleaned out the disc spaces of both levels. The endplates using sequential distraction at L3-4 with a 9 distractor in place I inserted 12-1408 lordotic cages 26.1 m in length. All cages were packed with the local autograft mixed and the autograft was also packed centrally and the disc space.  After all the cage in place stents taken the placement of the other 4 cortical screws again using AP lateral fluoroscopy for screws are placed all  screws and purchase in all screws and implants were confirmed be in good position by x-ray. Then the wound scope was irrigated meticulous hemostasis was maintained Gelfoam and DuraSeal was overlaid top of the dura I attached the head to the shanks of the L3 cortical screws 65 mm rods were placed all nuts were anchored in place all the foraminal reinspected prior Gelfoam and DuraSeal placement to confirm patency. Then placed a drain after Gelfoam and the DuraSeal and closed the wound in layers with interrupted Vicryl running 4 subcuticular. Dermabond benzo and Steri-Strips and sterile dressing applied patient recovered in stable condition. At the end of the case all needle, sponge cuts were correct.

## 2015-04-02 MED ORDER — PANTOPRAZOLE SODIUM 40 MG PO TBEC
80.0000 mg | DELAYED_RELEASE_TABLET | Freq: Two times a day (BID) | ORAL | Status: DC
  Administered 2015-04-02 – 2015-04-10 (×17): 80 mg via ORAL
  Filled 2015-04-02 (×20): qty 2

## 2015-04-02 MED ORDER — DEXAMETHASONE SODIUM PHOSPHATE 10 MG/ML IJ SOLN
10.0000 mg | Freq: Four times a day (QID) | INTRAMUSCULAR | Status: AC
  Administered 2015-04-02 (×2): 10 mg via INTRAVENOUS
  Filled 2015-04-02 (×2): qty 1

## 2015-04-02 MED ORDER — HYDROMORPHONE HCL 2 MG PO TABS
4.0000 mg | ORAL_TABLET | ORAL | Status: DC | PRN
  Administered 2015-04-02 – 2015-04-10 (×38): 4 mg via ORAL
  Filled 2015-04-02 (×39): qty 2

## 2015-04-02 MED FILL — Sodium Chloride IV Soln 0.9%: INTRAVENOUS | Qty: 2000 | Status: AC

## 2015-04-02 MED FILL — Heparin Sodium (Porcine) Inj 1000 Unit/ML: INTRAMUSCULAR | Qty: 30 | Status: AC

## 2015-04-02 NOTE — Anesthesia Postprocedure Evaluation (Signed)
Anesthesia Post Note  Patient: [name]  Procedure(s) Performed: Procedure(s) (LRB): POSTERIOR LUMBAR INTERBODY FUSION LUMBAR THREE-FOUR, LUMBAR FOUR-FIVE (N/A)  Anesthesia type: General  Patient location: PACU  Post pain: Pain level controlled  Post assessment: Post-op Vital signs reviewed  Last Vitals: BP 117/69 mmHg  Pulse 62  Temp(Src) 37.1 C (Oral)  Resp 16  Ht 5\' 9"  (1.753 m)  Wt 270 lb (122.471 kg)  BMI 39.85 kg/m2  SpO2 99%  LMP 12/13/1999  Post vital signs: Reviewed  Level of consciousness: sedated  Complications: No apparent anesthesia complications

## 2015-04-02 NOTE — Telephone Encounter (Signed)
I spoke with pt and she is in the hospital right now. She will send a my chart message when she gets home and decide whether or not she wants to try the Lipitor. Dr. Clent RidgesFry is aware of this

## 2015-04-02 NOTE — Progress Notes (Signed)
PT Cancellation Note  Patient Details Name: Osborne OmanMyranda Payne MRN: [id number] DOB: 08/08/1975   Cancelled Treatment:    Reason Eval/Treat Not Completed: Patient not medically ready. Pt currently on bedrest and MD requesting that therapy start 04/03/15.    Conni SlipperKirkman, Kaine Mcquillen 04/02/2015, 8:36 AM   Conni SlipperLaura Camryn Lampson, PT, DPT Acute Rehabilitation Services Pager: [phone]

## 2015-04-02 NOTE — Progress Notes (Signed)
Subjective: Patient reports Condition of a lot of back pain a lot of anterior quad and hip pain  Objective: Vital signs in last 24 hours: Temp:  [97.5 F (36.4 C)-98.2 F (36.8 C)] 97.9 F (36.6 C) (11/01 0330) Pulse Rate:  [62-103] 68 (11/01 0330) Resp:  [0-18] 18 (11/01 0330) BP: (108-149)/(67-91) 108/71 mmHg (11/01 0330) SpO2:  [95 %-98 %] 98 % (11/01 0330) Weight:  [122.471 kg (270 lb)] 122.471 kg (270 lb) (10/31 0825)  Intake/Output from previous day: 10/31 0701 - 11/01 0700 In: 2740 [I.V.:2600; Blood:140] Out: 2790 [Urine:2125; Drains:215; Blood:450] Intake/Output this shift:    Strength 5 out of 5 wound clean dry and intact  Lab Results: No results for input(s): WBC, HGB, HCT, PLT in the last 72 hours. BMET No results for input(s): NA, K, CL, CO2, GLUCOSE, BUN, CREATININE, CALCIUM in the last 72 hours.  Studies/Results: Dg Lumbar Spine 2-3 Views  04/01/2015  CLINICAL DATA:  Posterior fusion. EXAM: DG C-ARM 61-120 MIN; LUMBAR SPINE - 2-3 VIEW COMPARISON:  Lumbar spine CT 01/02/2015. FINDINGS: Posterior pedicle screws at L3, L4, L5. Inter disc fusion device is noted L3-L4 and L4-L5. Lumbar vertebra numbered with the lowest segmented appearing vertebra as L5. IMPRESSION: Postsurgical changes L3 through 5 as described above. Electronically Signed   By: Maisie Fushomas  Register   On: 04/01/2015 14:05   Dg C-arm 1-60 Min  04/01/2015  CLINICAL DATA:  Posterior fusion. EXAM: DG C-ARM 61-120 MIN; LUMBAR SPINE - 2-3 VIEW COMPARISON:  Lumbar spine CT 01/02/2015. FINDINGS: Posterior pedicle screws at L3, L4, L5. Inter disc fusion device is noted L3-L4 and L4-L5. Lumbar vertebra numbered with the lowest segmented appearing vertebra as L5. IMPRESSION: Postsurgical changes L3 through 5 as described above. Electronically Signed   By: Maisie Fushomas  Register   On: 04/01/2015 14:05    Assessment/Plan: DC drain keep her flat this morning start mobilizing her later on this morning early part of the  afternoon change her pain medication around at a couple doses of Decadron with Protonix value as a muscle relaxer change the pills to Dilaudid oral  LOS: 1 day     Brailyn Delman P 04/02/2015, 8:12 AM

## 2015-04-02 NOTE — Progress Notes (Signed)
OT Cancellation Note  Patient Details Name: Hannah OmanMyranda Carrigan MRN: [id number] DOB: 11/17/1975   Cancelled Treatment:    Reason Eval/Treat Not Completed: Patient not medically ready (BR- DR Wynetta Emeryram asked to hold ) Spoke directly with Dr Wynetta Emeryram and requesting therapy start 04/03/15  Felecia ShellingJones, Cleo Santucci B   Kaydense Rizo, Brynn   OTR/L Pager: [phone] Office: [phone] .  04/02/2015, 8:16 AM

## 2015-04-03 LAB — CATECHOLAMINES, FRACTIONATED, URINE, 24 HOUR
Calculated Total (E+NE): 42 mcg/24 h (ref 26–121)
Creatinine, Urine mg/day-CATEUR: 1.27 g/(24.h) (ref 0.63–2.50)
Dopamine, 24 hr Urine: 231 mcg/24 h (ref 52–480)
Norepinephrine, 24 hr Ur: 42 mcg/24 h (ref 15–100)
Total Volume - CF 24Hr U: 1050 mL

## 2015-04-03 LAB — METANEPHRINES, URINE, 24 HOUR
Metaneph Total, Ur: 375 mcg/24 h (ref 182–739)
Metanephrines, Ur: 92 mcg/24 h (ref 58–203)
Normetanephrine, 24H Ur: 283 mcg/24 h (ref 88–649)

## 2015-04-03 NOTE — Progress Notes (Signed)
When pt got up to chair she began to have a headache. Placed her back in bed and laid her flat. MD in earlier and stated to keep pt flat to prevent spinal headache. Pt. Is lying flat on left side at this time

## 2015-04-03 NOTE — Progress Notes (Signed)
Pt arrived to unit via bed from [facility] and assisted to new bed by staff.  Pt alert and oriented to staff and plan of care.  Pt complaining of back pain 7-10. Vitals and assessment stable, see flow sheets.

## 2015-04-03 NOTE — Care Management Note (Addendum)
Case Management Note  Patient Details  Name: Osborne OmanMyranda Fullwood MRN: [id number] Date of Birth: 12/20/1975  Subjective/Objective:                    Action/Plan: Patient admitted with spinal stenosis of lumbar region. Pt underwent a L3-5 PLIF. Patient lives at home with her husband. Awaiting PT/OT recommendations for discharge disposition. CM will continue to follow for discharge needs.   Expected Discharge Date:                  Expected Discharge Plan:  Home/Self Care  In-House Referral:     Discharge planning Services     Post Acute Care Choice:    Choice offered to:     DME Arranged:    DME Agency:     HH Arranged:    HH Agency:     Status of Service:  In process, will continue to follow  Medicare Important Message Given:    Date Medicare IM Given:    Medicare IM give by:    Date Additional Medicare IM Given:    Additional Medicare Important Message give by:     If discussed at Long Length of Stay Meetings, dates discussed:    Additional Comments:  Kermit BaloKelli F Ryana Montecalvo, RN 04/03/2015, 10:57 AM

## 2015-04-03 NOTE — Progress Notes (Signed)
PT Cancellation Note  Patient Details Name: Hannah OmanMyranda Casselman MRN: [id number] DOB: 06/17/1975   Cancelled Treatment:    Reason Eval/Treat Not Completed: Patient not medically ready.  Pt on bedrest today.  Will see as able 11/3. 04/03/2015  Crosby BingKen Lotus Gover, PT [phone]  (pager)   Laritza Vokes, Eliseo GumKenneth V 04/03/2015, 12:41 PM

## 2015-04-03 NOTE — Progress Notes (Addendum)
Patient stated she was not feeling well, stated she had cramping headache. Patient was returned from chair to bed. Patient stated that her headache had resolved once returned to flat bed. VSS. Dressing is clean, dry, and intact. Foley still in place. RN to make MD aware.   MD aware. Per MD, keep patient on flat bedrest for 24 hours. Patient aware, voiced understanding. Will continue to log roll. Will continue to monitor closely.

## 2015-04-03 NOTE — Progress Notes (Signed)
OT Cancellation Note  Patient Details Name: [name] MRN: [id number] DOB: 05/19/1976   Cancelled Treatment:    Reason Eval/Treat Not Completed: Patient not medically ready. Per conversation with nurse, pt remains on bedrest today. Will reattempt OT evaluation tomorrow.   [name], Koda Routon H 04/03/2015, 10:13 AM

## 2015-04-03 NOTE — Progress Notes (Signed)
Patient ID: [name], female   DOB: 09/14/1975, 39 y.o.   MRN: [id number] Had some difficulty yesterday when she got up started having a postural headache so put her back to bed overnight as morning she was having a lot of back pain but no radicular symptoms. And no headache  Strength out of 5 wound clean dry and intact  We'll continue flat bedrest overnight mobilize again tomorrow

## 2015-04-04 MED ORDER — DEXAMETHASONE SODIUM PHOSPHATE 10 MG/ML IJ SOLN
10.0000 mg | Freq: Four times a day (QID) | INTRAMUSCULAR | Status: AC
  Administered 2015-04-04 (×2): 10 mg via INTRAVENOUS
  Filled 2015-04-04 (×2): qty 1

## 2015-04-04 MED ORDER — SENNA 8.6 MG PO TABS
1.0000 | ORAL_TABLET | Freq: Two times a day (BID) | ORAL | Status: DC
  Administered 2015-04-04 – 2015-04-08 (×7): 8.6 mg via ORAL
  Filled 2015-04-04 (×9): qty 1

## 2015-04-04 NOTE — Evaluation (Signed)
Physical Therapy Evaluation Patient Details Name: [name] MRN: [id number] DOB: 01/23/1976 Today's Date: 04/04/2015   History of Present Illness  Adm 04/01/15 for L3-L5 laminectomies and fusion. Post-op spinal headached with bedrest until 11/3. PMHx- back surgery x 2; anxiety, fibromyalgiz    Clinical Impression  Patient is s/p above surgery resulting in the deficits listed below (see PT Problem List). Patient is very anxious, tearful, and afraid to move/hurt. Anticipate slow progress due to prolonged bedrest and anxiety. Patient will benefit from skilled PT to increase their independence and safety with mobility (while adhering to their precautions) to allow discharge to the venue listed below.     Follow Up Recommendations Home health PT;Supervision for mobility/OOB (although anticipate slow progress)    Equipment Recommendations  None recommended by PT    Recommendations for Other Services OT consult     Precautions / Restrictions Precautions Precautions: Back Precaution Booklet Issued: Yes (comment) Required Braces or Orthoses: Spinal Brace Spinal Brace: Lumbar corset;Applied in sitting position      Mobility  Bed Mobility Overal bed mobility: Needs Assistance Bed Mobility: Rolling;Sidelying to Sit Rolling: Supervision Sidelying to sit: Min assist       General bed mobility comments: + heavy use of rail; vc for technique and assist to maintian back precautions; nearly 25 minutes from supine to sit to stand due to anxiety  Transfers Overall transfer level: Needs assistance Equipment used: Rolling walker (2 wheeled) Transfers: Sit to/from Stand Sit to Stand: Mod assist;+2 physical assistance;+2 safety/equipment;From elevated surface         General transfer comment: slow to extend bil hips and knees; incr reliance on UEs on RW  Ambulation/Gait Ambulation/Gait assistance: Mod assist;+2 physical assistance;+2 safety/equipment Ambulation Distance (Feet): 2  Feet Assistive device: Rolling walker (2 wheeled) Gait Pattern/deviations: Step-to pattern;Shuffle   Gait velocity interpretation: <1.8 ft/sec, indicative of risk for recurrent falls General Gait Details: initially side step to Lt/HOB and then pivotal steps to chair; incr time due to anxiety; tearful; assist to move/advance RLE at times  Stairs            Wheelchair Mobility    Modified Rankin (Stroke Patients Only)       Balance                                             Pertinent Vitals/Pain Pain Assessment: 0-10 Pain Score: 8  Pain Location: back; Lt posterior hip/thigh Pain Descriptors / Indicators: Burning;Aching;Operative site guarding Pain Intervention(s): Limited activity within patient's tolerance;Monitored during session;Premedicated before session;Repositioned;Patient requesting pain meds-RN notified;Relaxation    Home Living Family/patient expects to be discharged to:: Private residence Living Arrangements: Spouse/significant other Available Help at Discharge: Family Type of Home: House Home Access: Stairs to enter Entrance Stairs-Rails: None Entrance Stairs-Number of Steps: 1+1  Home Layout: One level Home Equipment: Environmental consultantWalker - 2 wheels;Bedside commode Additional Comments: reports has a tall bed she has to get on her tiptoes; husband plans to lower 3-4" vs step stool    Prior Function Level of Independence: Needs assistance   Gait / Transfers Assistance Needed: Ambulating with LLE pain           Hand Dominance        Extremity/Trunk Assessment   Upper Extremity Assessment: Overall WFL for tasks assessed;Defer to OT evaluation           Lower Extremity  Assessment: Generalized weakness;RLE deficits/detail;LLE deficits/detail      Cervical / Trunk Assessment: Other exceptions  Communication   Communication: No difficulties  Cognition Arousal/Alertness: Awake/alert Behavior During Therapy: Anxious (tearful) Overall  Cognitive Status: Within Functional Limits for tasks assessed                      General Comments General comments (skin integrity, edema, etc.): Husband present throughout. Pt noted slight incr in headache on initial sitting. After 7 minutes, headache unchanged and pt wanted to stand    Exercises        Assessment/Plan    PT Assessment Patient needs continued PT services  PT Diagnosis Difficulty walking;Acute pain   PT Problem List Decreased strength;Decreased activity tolerance;Decreased mobility;Decreased knowledge of use of DME;Decreased knowledge of precautions;Obesity;Pain  PT Treatment Interventions DME instruction;Gait training;Functional mobility training;Therapeutic activities;Patient/family education   PT Goals (Current goals can be found in the Care Plan section) Acute Rehab PT Goals Patient Stated Goal: decr pain PT Goal Formulation: With patient/family Time For Goal Achievement: 04/09/15 Potential to Achieve Goals: Good    Frequency Min 5X/week   Barriers to discharge        Co-evaluation               End of Session Equipment Utilized During Treatment: Gait belt;Back brace Activity Tolerance: Patient limited by pain;Other (comment) (anxiety) Patient left: in chair;with call bell/phone within reach;with family/visitor present Nurse Communication: Mobility status;Patient requests pain meds         Time: 1341-1430 PT Time Calculation (min) (ACUTE ONLY): 49 min   Charges:   PT Evaluation $Initial PT Evaluation Tier I: 1 Procedure PT Treatments $Therapeutic Activity: 23-37 mins   PT G Codes:        Abb Gobert 2015/04/30, 2:55 PM Pager [phone]

## 2015-04-04 NOTE — Progress Notes (Signed)
OT Cancellation Note  Patient Details Name: [name] MRN: [id number] DOB: 10/19/1975   Cancelled Treatment:    Reason Eval/Treat Not Completed: Other (comment) (Pt had just finished with Pt.) OT to reattempt as schedule permits.  [name], [name] 04/04/2015, 2:37 PM

## 2015-04-04 NOTE — Progress Notes (Signed)
Raise HOB slowly per MD request this AM. Patient able to tolerated with no complaints.  Sim BoastHavy, RN

## 2015-04-04 NOTE — Progress Notes (Addendum)
Pt c/o pain after sitting and standing. She wanted me to call Dr she states pain is like her muscle is ripping out of her leg.  Paged MD, RN spoke to Dr. Venetia MaxonStern, MD ok to administered PO dilaudid at this time. Will continue to monitor.   Sim BoastHavy, RN

## 2015-04-04 NOTE — Progress Notes (Signed)
Subjective: Patient reports Still severe back pain but slowly improving no headache  Objective: Vital signs in last 24 hours: Temp:  [97.7 F (36.5 C)-98.4 F (36.9 C)] 98.2 F (36.8 C) (11/03 0436) Pulse Rate:  [63-101] 71 (11/03 0436) Resp:  [16-18] 16 (11/03 0436) BP: (104-142)/(54-97) 136/93 mmHg (11/03 0436) SpO2:  [94 %-97 %] 95 % (11/03 0436)  Intake/Output from previous day: 11/02 0701 - 11/03 0700 In: 720 [P.O.:720] Out: 700 [Urine:700] Intake/Output this shift:    Strength out of 5 wound clean dry and intact  Lab Results: No results for input(s): WBC, HGB, HCT, PLT in the last 72 hours. BMET No results for input(s): NA, K, CL, CO2, GLUCOSE, BUN, CREATININE, CALCIUM in the last 72 hours.  Studies/Results: No results found.  Assessment/Plan: Slowly raise her head of her bed this morning may get out of bed after lunch. We'll give her a couple additional doses of Decadron this morning  LOS: 3 days     Zayden Hahne P 04/04/2015, 7:34 AM

## 2015-04-05 NOTE — Progress Notes (Signed)
Subjective: Patient reports Still struggles with pain management having a little bit of lateral left quad pain this may represent bursitis or TFL irritation  Objective: Vital signs in last 24 hours: Temp:  [98.2 F (36.8 C)-98.8 F (37.1 C)] 98.2 F (36.8 C) (11/04 0555) Pulse Rate:  [72-94] 72 (11/04 0555) Resp:  [18-20] 18 (11/04 0555) BP: (117-151)/(67-95) 144/95 mmHg (11/04 0555) SpO2:  [94 %-97 %] 97 % (11/04 0555)  Intake/Output from previous day: 11/03 0701 - 11/04 0700 In: 300 [P.O.:300] Out: 2350 [Urine:2350] Intake/Output this shift:    Strength out of 5  Lab Results: No results for input(s): WBC, HGB, HCT, PLT in the last 72 hours. BMET No results for input(s): NA, K, CL, CO2, GLUCOSE, BUN, CREATININE, CALCIUM in the last 72 hours.  Studies/Results: No results found.  Assessment/Plan: Mobilized day with physical outpatient therapy DC her Foley after lunch.  LOS: 4 days     Proctor Carriker P 04/05/2015, 7:35 AM

## 2015-04-05 NOTE — Progress Notes (Signed)
Foley discontinued. 500 ml of clear, yellow urine emptied. Pt denies discomfort. Will continue to monitor.

## 2015-04-05 NOTE — Evaluation (Signed)
Occupational Therapy Evaluation PatRanya [name] Name: [name]: [id number] DOB: Mar 30, 1976 Today's Date: 04/05/2015    History of Present Illness Adm 04/01/15 for L3-L5 laminectomies and fusion. Post-op spinal headached with bedrest until 11/3. PMHx- back surgery x 2; anxiety, fibromyalgia   Clinical Impression   Limited evaluation today due to pt's pain and wanting to return to bed. Pt very focused on her pain and narrating her pain with each movement.  Will need to ask pt about bathroom set up for home.  Moves slowly, but only required min guard assist to perform pivot transfer.  Will follow acutely.    Follow Up Recommendations  No OT follow up    Equipment Recommendations  Other (comment) (to be determined)    Recommendations for Other Services       Precautions / Restrictions Precautions Precautions: Back;Fall Precaution Comments: instructed in back precautions, pt well aware from past OPPT Required Braces or Orthoses: Spinal Brace Spinal Brace: Lumbar corset;Applied in sitting position Restrictions Weight Bearing Restrictions: No      Mobility Bed Mobility Overal bed mobility: Needs Assistance Bed Mobility: Sit to Sidelying   Sit to sidelying: Mod assist General bed mobility comments: assisted with LEs back into bed  Transfers Overall transfer level: Needs assistance Equipment used: Rolling walker (2 wheeled) Transfers: Sit to/from [employer] Sit to Stand: Min guard Stand pivot transfers: Min guard       General transfer comment: slow to extend bil hips and knees; incr reliance on UEs on RW    Balance Overall balance assessment: Needs assistance Sitting-balance support: Feet supported Sitting balance-Leahy Scale: Fair       Standing balance-Leahy Scale: Poor Standing balance comment: heavy reliance on UEs                            ADL Overall ADL's : Needs assistance/impaired Eating/Feeding:  Independent;Sitting   Grooming: Wash/dry hands;Wash/dry face;Sitting;Set up   Upper Body Bathing: Sitting;Set up   Lower Body Bathing: Maximal assistance;Sit to/from stand   Upper Body Dressing : Set up;Sitting   Lower Body Dressing: Maximal assistance;Sit to/from stand   Toilet Transfer: Min guard;Stand-pivot;RW   Toileting- Architect and Hygiene: Maximal assistance;Sit to/from stand               Vision     Perception     Praxis      Pertinent Vitals/Pain Pain Assessment: 0-10 Pain Score: 8  Faces Pain Scale: Hurts whole lot Pain Location: back, buttocks Pain Descriptors / Indicators: Aching;Constant Pain Intervention(s): Limited activity within patient's tolerance;Monitored during session;Repositioned;Patient requesting pain meds-RN notified;Ice applied     Hand Dominance Right   Extremity/Trunk Assessment Upper Extremity Assessment Upper Extremity Assessment: Overall WFL for tasks assessed   Lower Extremity Assessment Lower Extremity Assessment: Defer to PT evaluation   Cervical / Trunk Assessment Cervical / Trunk Exceptions: needs cues for upright posture   Communication Communication Communication: No difficulties   Cognition Arousal/Alertness: Awake/alert Behavior During Therapy: Anxious Overall Cognitive Status: Within Functional Limits for tasks assessed                     General Comments       Exercises       Shoulder Instructions      Home Living Family/patient expects to be discharged to:: Private residence Living Arrangements: Spouse/significant other Available Help at Discharge: Family;Available 24 hours/day Type of Home: House Home Access: Stairs  to enter Entrance Stairs-Number of Steps: 1+1  Entrance Stairs-Rails: None Home Layout: One level               Home Equipment: Walker - 2 wheels;Bedside commode   Additional Comments: reports has a tall bed she has to get on her tiptoes; husband plans to  lower 3-4" vs step stool      Prior Functioning/Environment Level of Independence: Needs assistance  Gait / Transfers Assistance Needed: Ambulating with LLE pain          OT Diagnosis: Generalized weakness;Acute pain   OT Problem List: Decreased strength;Decreased activity tolerance;Impaired balance (sitting and/or standing);Decreased knowledge of use of DME or AE;Decreased knowledge of precautions;Obesity;Pain   OT Treatment/Interventions: Self-care/ADL training;DME and/or AE instruction;Patient/family education;Balance training    OT Goals(Current goals can be found in the care plan section) Acute Rehab OT Goals Patient Stated Goal: decrease pain OT Goal Formulation: With patient Time For Goal Achievement: 04/12/15 Potential to Achieve Goals: Good ADL Goals Pt Will Perform Grooming: with supervision;standing Pt Will Perform Lower Body Bathing: with supervision;with adaptive equipment;sit to/from stand Pt Will Perform Lower Body Dressing: with supervision;with adaptive equipment;sit to/from stand Pt Will Transfer to Toilet: with supervision;ambulating;bedside commode (over toilet) Pt Will Perform Toileting - Clothing Manipulation and hygiene: with supervision;with adaptive equipment;sit to/from stand Additional ADL Goal #1: Pt will generalize back precautions in ADL and mobility independently.  OT Frequency: Min 2X/week   Barriers to D/C:            Co-evaluation              End of Session Equipment Utilized During Treatment: Gait belt;Rolling walker;Back brace Nurse Communication: Mobility status;Patient requests pain meds (RN observed session)  Activity Tolerance: Patient limited by pain Patient left: in bed;with call bell/phone within reach;with bed alarm set;with family/visitor present   Time: 1240-1300 OT Time Calculation (min): 20 min Charges:  OT General Charges $OT Visit: 1 Procedure OT Evaluation $Initial OT Evaluation Tier I: 1 Procedure G-Codes:     Evern BioMayberry, Cadience Bradfield Lynn 04/05/2015, 1:35 PM [phone]

## 2015-04-05 NOTE — Progress Notes (Signed)
Patient would like to keep foley until working with PT/OT. Will continue to monitor. Suzy Bouchardhompson, Iveliz Garay E, RN 04/05/2015 6:18 AM

## 2015-04-05 NOTE — Progress Notes (Signed)
Physical Therapy Treatment Patient Details Name: Osborne OmanMyranda Hazan MRN: [id number] DOB: 06/01/1975 Today's Date: 04/05/2015    History of Present Illness Adm 04/01/15 for L3-L5 laminectomies and fusion. Post-op spinal headached with bedrest until 11/3. PMHx- back surgery x 2; anxiety, fibromyalgia    PT Comments    Patient moving slightly faster today (required nearly 1 hr for supine through to walking 11 feet). Tried to distract pt (she focuses on every little movement she needs to make and every twinge of pain she feels). Did best when asked her to walk as far as she could in 2 minutes. Only requiring min assist. Needs to be up OOB more than 1 time per day and discussed with RN.   Follow Up Recommendations  Home health PT;Supervision for mobility/OOB (slow progress; ? CIR)     Equipment Recommendations  None recommended by PT    Recommendations for Other Services OT consult     Precautions / Restrictions Precautions Precautions: Back Precaution Comments: pt able to state and maintain Required Braces or Orthoses: Spinal Brace Spinal Brace: Lumbar corset;Applied in sitting position    Mobility  Bed Mobility Overal bed mobility: Needs Assistance Bed Mobility: Rolling;Sidelying to Sit Rolling: Supervision Sidelying to sit: Min guard;HOB elevated (slightly elevated with rail)       General bed mobility comments: moving a bit more quickly  Transfers Overall transfer level: Needs assistance Equipment used: Rolling walker (2 wheeled) Transfers: Sit to/from Stand Sit to Stand: +2 safety/equipment;From elevated surface;Min assist         General transfer comment: slow to extend bil hips and knees; incr reliance on UEs on RW  Ambulation/Gait Ambulation/Gait assistance: Min assist;+2 safety/equipment Ambulation Distance (Feet): 11 Feet Assistive device: Rolling walker (2 wheeled) Gait Pattern/deviations: Step-through pattern;Decreased stride length;Decreased dorsiflexion -  right;Decreased dorsiflexion - left;Shuffle;Decreased weight shift to left (hip hike with knees extended) Gait velocity: VERY slow and anxious; ultimately responded better to "walk as far as you can in 2 minutes" Gait velocity interpretation: <1.8 ft/sec, indicative of risk for recurrent falls General Gait Details: initially side step to Lt/HOB and then pivotal steps to chair; incr time due to anxiety; tearful; assist to move/advance RLE at times   Stairs            Wheelchair Mobility    Modified Rankin (Stroke Patients Only)       Balance                                    Cognition Arousal/Alertness: Awake/alert Behavior During Therapy: Anxious (tearful) Overall Cognitive Status: Within Functional Limits for tasks assessed                      Exercises      General Comments General comments (skin integrity, edema, etc.): Husband arrived at end of session. Inguiring re: DME needs or home improvements he may need to make. He was considering handrails for hallway to bedroom (discouraged); plans to remove wheels from bed to make it shorter and easier to get into; discussed her RW doesn't slide on their carpet well (tennis balls vs plastic "skis" from DME store. He also plans to get bedrail for their bed from DME store      Pertinent Vitals/Pain Pain Assessment: Faces Faces Pain Scale: Hurts whole lot Pain Location: Lt buttock and posterior thigh Pain Descriptors / Indicators: Constant;Aching Pain Intervention(s): Limited activity within patient's  tolerance;Monitored during session;Premedicated before session;Repositioned    Home Living                      Prior Function            PT Goals (current goals can now be found in the care plan section) Acute Rehab PT Goals Patient Stated Goal: decr pain Time For Goal Achievement: 04/09/15 Progress towards PT goals: Progressing toward goals    Frequency  Min 5X/week    PT Plan  Current plan remains appropriate    Co-evaluation             End of Session Equipment Utilized During Treatment: Gait belt;Back brace Activity Tolerance: Patient limited by pain;Other (comment) (anxiety) Patient left: in chair;with call bell/phone within reach;with family/visitor present;with nursing/sitter in room     Time: 1610-9604 PT Time Calculation (min) (ACUTE ONLY): 60 min  Charges:  $Gait Training: 53-67 mins                    G Codes:      Daisha Filosa 2015/05/02, 12:52 PM  Pager [phone]

## 2015-04-05 NOTE — Progress Notes (Signed)
Patient upset about not receiving dilaudid and valium together. Administered valium and in hour and a half administered 4mg  PO dilaudid. Patient began crying and hysterical about pain being uncontrolled and educated patient that I would be in her room every two hours with pain medicine or muscle relaxer. Will continue to monitor and make as comfortable as possible. Suzy Bouchardhompson, Kaylan Yates E, [place]RN 04/04/2015 2131

## 2015-04-06 NOTE — Progress Notes (Signed)
Patient ID: [name], female   DOB: 09/14/1975, 39 y.o.   MRN: [id number] Afeb, vss No new neuro issues SLOWLY increasing activity. Wound clean and dry. Home in a few days per patient and Dr Wynetta Emeryram.

## 2015-04-06 NOTE — Progress Notes (Signed)
Physical Therapy Treatment Patient Details Name: Hannah OmanMyranda [name] MRN: [id number] DOB: 12/10/1975 Today's Date: 04/06/2015    History of Present Illness Adm 04/01/15 for L3-L5 laminectomies and fusion. Post-op spinal headached with bedrest until 11/3. PMHx- back surgery x 2; anxiety, fibromyalgiz    PT Comments    Pt continues to be very anxious about mobility but remains motivated to reach mod I level of independence for return home. She ambulated to sink to wash hands this session w/ min assist to achieve foot clearance w/ Lt LE.  Pt will benefit from continued skilled PT services to increase functional independence and safety.   Follow Up Recommendations  Home health PT;Supervision for mobility/OOB (slow progress; ? CIR)     Equipment Recommendations  None recommended by PT    Recommendations for Other Services       Precautions / Restrictions Precautions Precautions: Back Precaution Comments: reminders provided to avoid bending or leaning on sink counter Required Braces or Orthoses: Spinal Brace Spinal Brace: Lumbar corset;Applied in sitting position Restrictions Weight Bearing Restrictions: No    Mobility  Bed Mobility Overal bed mobility: Needs Assistance Bed Mobility: Rolling;Sidelying to Sit Rolling: Supervision (use of rail) Sidelying to sit: Min guard;HOB elevated (no rail)       General bed mobility comments: Pt performed rolling using bed rail and HOB elevated despite PT asking pt to wait until bed flat. No use of rail and bed flat for sidelying to sit.  Transfers Overall transfer level: Needs assistance Equipment used: Rolling walker (2 wheeled) Transfers: Sit to/from [employer] CorporationStand;Stand Pivot Transfers Sit to Stand: +2 safety/equipment;From elevated surface;Min assist Stand pivot transfers: Min guard;+2 safety/equipment       General transfer comment: slow to extend bil hips and knees; incr reliance on UEs on RW.  Cues for hand placement as pt attempts to stand  w/ Bil hands on RW.  Ambulation/Gait Ambulation/Gait assistance: Min assist;+2 safety/equipment Ambulation Distance (Feet): 10 Feet Assistive device: Rolling walker (2 wheeled) Gait Pattern/deviations: Step-through pattern;Shuffle;Antalgic;Trunk flexed;Decreased dorsiflexion - left Gait velocity: VERY slow and anxious; was motivated by the chance to wash her hands if she ambulated to the sink Gait velocity interpretation: <1.8 ft/sec, indicative of risk for recurrent falls General Gait Details: Pt shuffling feet initially.  Required assist achieving Lt knee flexion and clearing floor when stepping w/ Lt LE.  Pt then begins initiating Bil knee flexion foot clearance, although still minimal.  Pt maintains flexed trunk and relies heavily on RW.   Stairs            Wheelchair Mobility    Modified Rankin (Stroke Patients Only)       Balance Overall balance assessment: Needs assistance Sitting-balance support: Feet supported;Bilateral upper extremity supported Sitting balance-Leahy Scale: Fair     Standing balance support: Bilateral upper extremity supported;During functional activity Standing balance-Leahy Scale: Poor Standing balance comment: Pt becomes very anxious after standing at sink for a few minutes and begins shaking and requesting to sit                    Cognition Arousal/Alertness: Awake/alert Behavior During Therapy: Anxious (tearful) Overall Cognitive Status: Within Functional Limits for tasks assessed                      Exercises General Exercises - Lower Extremity Ankle Circles/Pumps: AROM;Both;10 reps;Seated    General Comments General comments (skin integrity, edema, etc.): Pt reports her husband may install railings on her bed at home  to make rolling easier      Pertinent Vitals/Pain Pain Assessment: Faces Faces Pain Scale: Hurts whole lot Pain Location: back, Lt post hip/thigh Pain Descriptors / Indicators:  Aching;Grimacing;Guarding;Constant;Burning Pain Intervention(s): Limited activity within patient's tolerance;Monitored during session;Repositioned    Home Living Family/patient expects to be discharged to:: Private residence Living Arrangements: Spouse/significant other                  Prior Function            PT Goals (current goals can now be found in the care plan section) Acute Rehab PT Goals Patient Stated Goal: decr pain and to be able to do things Independently PT Goal Formulation: With patient Time For Goal Achievement: 04/09/15 Potential to Achieve Goals: Good Progress towards PT goals: Progressing toward goals (very modestly)    Frequency  Min 5X/week    PT Plan Current plan remains appropriate    Co-evaluation             End of Session Equipment Utilized During Treatment: Gait belt;Back brace Activity Tolerance: Patient limited by pain;Other (comment) (anxiety) Patient left: in chair;with call bell/phone within reach;with family/visitor present;with nursing/sitter in room     Time: 8413-2440 PT Time Calculation (min) (ACUTE ONLY): 38 min  Charges:  $Gait Training: 8-22 mins $Therapeutic Activity: 23-37 mins                    G Codes:      Michail Jewels PT, [place] [phone] Pager: [phone] 04/06/2015, 2:59 PM

## 2015-04-06 NOTE — Progress Notes (Signed)
Occupational Therapy Treatment Patient Details Name: [name] MRN: [id number] DOB: 08/14/1975 Today's Date: 04/06/2015    History of present illness Adm 04/01/15 for L3-L5 laminectomies and fusion. Post-op spinal headached with bedrest until 11/3. PMHx- back surgery x 2; anxiety, fibromyalgiz   OT comments  Pt. Making gains this session.  Able to manage anxiety better during functional mobility.  States she does best "if people just tell me what to expect".    States she needs step by step instructions with explanation of what the outcome will be.  Able to complete bed mobility and bsc toilet transfer.  Focus of next session is UB/LB ADLS.  Pt. Is very motivated as she will not have 24/7 at home.   Follow Up Recommendations  No OT follow up    Equipment Recommendations  Other (comment)    Recommendations for Other Services      Precautions / Restrictions Precautions Precautions: Back;Fall Precaution Comments: instructed in back precautions, pt well aware from past OPPT Required Braces or Orthoses: Spinal Brace Spinal Brace: Lumbar corset;Applied in sitting position       Mobility Bed Mobility Overal bed mobility: Needs Assistance Bed Mobility: Rolling;Sidelying to Sit Rolling: Supervision Sidelying to sit: Min guard       General bed mobility comments: hob flat, exits on left side of bed, bed height elevated to height of bed at home-will need to practice back into bed at the same hieght  Transfers Overall transfer level: Needs assistance Equipment used: Rolling walker (2 wheeled) Transfers: Sit to/from [employer]Stand;Stand Pivot Transfers Sit to Stand: Min guard Stand pivot transfers: Min guard       General transfer comment: slow to extend bil hips and knees; incr reliance on UEs on RW    Balance                                   ADL Overall ADL's : Needs assistance/impaired     Grooming: Wash/dry hands;Sitting;Set up           Upper Body  Dressing : Set up;Sitting Upper Body Dressing Details (indicate cue type and reason): donned back brace in sitting     Toilet Transfer: Minimal assistance;Stand-pivot;BSC;RW Toilet Transfer Details (indicate cue type and reason): pt. feels more comfortable transferring to the left due to c/o "tingling" in r le Toileting- Clothing Manipulation and Hygiene: Min guard;Sit to/from stand Toileting - Clothing Manipulation Details (indicate cue type and reason): partial sit/stand with ue support on rw     Functional mobility during ADLs: Minimal assistance General ADL Comments: able to complete bed mobility, bsc transfer      Vision                     Perception     Praxis      Cognition   Behavior During Therapy: Anxious Overall Cognitive Status: Within Functional Limits for tasks assessed                       Extremity/Trunk Assessment               Exercises     Shoulder Instructions       General Comments      Pertinent Vitals/ Pain       Pain Assessment: No/denies pain Pain Intervention(s): Premedicated before session  Home Living  Prior Functioning/Environment              Frequency Min 2X/week     Progress Toward Goals  OT Goals(current goals can now be found in the care plan section)  Progress towards OT goals: Progressing toward goals     Plan Discharge plan remains appropriate    Co-evaluation                 End of Session Equipment Utilized During Treatment: Rolling walker;Gait belt;Back brace   Activity Tolerance Patient tolerated treatment well   Patient Left in chair;with call bell/phone within reach   Nurse Communication          Time: 0817-0910 OT Time Calculation (min): 53 min  Charges: OT Treatments $Self Care/Home Management : 53-67 mins  Robet Leu, COTA/L 04/06/2015, 9:14 AM

## 2015-04-07 MED ORDER — BISACODYL 10 MG RE SUPP
10.0000 mg | Freq: Every day | RECTAL | Status: DC | PRN
  Administered 2015-04-07 – 2015-04-09 (×2): 10 mg via RECTAL
  Filled 2015-04-07 (×2): qty 1

## 2015-04-07 NOTE — Progress Notes (Signed)
No acute events. Complaining of right thigh pain since physical therapy yesterday. AVSS Full strength bilateral lower extremities Incision clean, dry, intact Stable Continue pain control Continue therapy

## 2015-04-07 NOTE — Progress Notes (Signed)
Occupational Therapy Treatment Patient Details Name: [name] MRN: [id number] DOB: 1975/06/22 Today's Date: 04/07/2015    History of present illness Adm 04/01/15 for L3-L5 laminectomies and fusion. Post-op spinal headached with bedrest until 11/3. PMHx- back surgery x 2; anxiety, fibromyalgiz   OT comments  Introduced and had pt. Return demo of use of A/E for LB ADLS.  Reviewed peri-care A/E options also.  Discussed plans for tub transfer with use of 3N1 and will attempt this next session.  Pt. Remains motivated and eager to progress with therapy to promote I upon return home.    Follow Up Recommendations  No OT follow up    Equipment Recommendations  Other (comment)    Recommendations for Other Services      Precautions / Restrictions Precautions Precautions: Back Precaution Comments: able to recall 3/3 back precautions Required Braces or Orthoses: Spinal Brace Spinal Brace: Lumbar corset;Applied in sitting position       Mobility Bed Mobility                  Transfers                      Balance                                   ADL Overall ADL's : Needs assistance/impaired             Lower Body Bathing: With adaptive equipment Lower Body Bathing Details (indicate cue type and reason): showed and demonstrated use of LH sponge for LB bathing     Lower Body Dressing: With adaptive equipment;Cueing for sequencing;Cueing for back precautions Lower Body Dressing Details (indicate cue type and reason): able to return demo of use of A/E for LB dressing of socks with use of reacher and sock-aide     Toileting- Clothing Manipulation and Hygiene: With adaptive equipment Toileting - Clothing Manipulation Details (indicate cue type and reason): provided examples of kitchen tongs vs toilet aide for peri-care and reviewed standing methods and also use of flushable wet toilet paper as needed    Tub/Shower Transfer Details (indicate cue  type and reason): reviewed options and transfer tech. for tub transfer ie: side step vs backing up to 3n1 in/out of tub, or tub bench, pt. interested in using the 3n1 in/out of tub and backing up to it until she feels safe stepping over tub (attempt for pt. to try this transfer next session)   General ADL Comments: introduction and use of A/E including toileting options, also reviewed tub transfer options      Vision                     Perception     Praxis      Cognition   Behavior During Therapy: Anxious Overall Cognitive Status: Within Functional Limits for tasks assessed                       Extremity/Trunk Assessment               Exercises     Shoulder Instructions       General Comments      Pertinent Vitals/ Pain       Pain Assessment:  (did not rate, pt. was recieving pain meds upon arrival into room) Pain Intervention(s): RN gave pain meds during session  Home Living  Prior Functioning/Environment              Frequency Min 2X/week     Progress Toward Goals  OT Goals(current goals can now be found in the care plan section)        Plan Discharge plan remains appropriate    Co-evaluation                 End of Session Equipment Utilized During Treatment: Other (comment) (A/E)   Activity Tolerance Patient tolerated treatment well   Patient Left in chair;with call bell/phone within reach;with family/visitor present   Nurse Communication          Time: 1541-1610 OT Time Calculation (min): 29 min  Charges: OT General Charges $OT Visit: 1 Procedure OT Treatments $Self Care/Home Management : 23-37 mins  Robet LeuMorris, Lissett Favorite Lorraine, COTA/L 04/07/2015, 4:25 PM

## 2015-04-08 ENCOUNTER — Inpatient Hospital Stay (HOSPITAL_COMMUNITY): Payer: BLUE CROSS/BLUE SHIELD

## 2015-04-08 LAB — CBC WITH DIFFERENTIAL/PLATELET
Basophils Absolute: 0 10*3/uL (ref 0.0–0.1)
Basophils Relative: 0 %
Eosinophils Absolute: 0.3 10*3/uL (ref 0.0–0.7)
Eosinophils Relative: 3 %
HCT: 39.1 % (ref 36.0–46.0)
Hemoglobin: 13 g/dL (ref 12.0–15.0)
Lymphocytes Relative: 29 %
Lymphs Abs: 3.2 10*3/uL (ref 0.7–4.0)
MCH: 30.2 pg (ref 26.0–34.0)
MCHC: 33.2 g/dL (ref 30.0–36.0)
MCV: 90.7 fL (ref 78.0–100.0)
Monocytes Absolute: 0.7 10*3/uL (ref 0.1–1.0)
Monocytes Relative: 6 %
Neutro Abs: 6.9 10*3/uL (ref 1.7–7.7)
Neutrophils Relative %: 62 %
Platelets: 325 10*3/uL (ref 150–400)
RBC: 4.31 MIL/uL (ref 3.87–5.11)
RDW: 13 % (ref 11.5–15.5)
WBC: 11.2 10*3/uL — ABNORMAL HIGH (ref 4.0–10.5)

## 2015-04-08 LAB — URINALYSIS, ROUTINE W REFLEX MICROSCOPIC
Bilirubin Urine: NEGATIVE
Glucose, UA: NEGATIVE mg/dL
Hgb urine dipstick: NEGATIVE
Ketones, ur: NEGATIVE mg/dL
Leukocytes, UA: NEGATIVE
Nitrite: NEGATIVE
Protein, ur: NEGATIVE mg/dL
Specific Gravity, Urine: 1.02 (ref 1.005–1.030)
Urobilinogen, UA: 0.2 mg/dL (ref 0.0–1.0)
pH: 7 (ref 5.0–8.0)

## 2015-04-08 LAB — BASIC METABOLIC PANEL
Anion gap: 11 (ref 5–15)
BUN: 12 mg/dL (ref 6–20)
CO2: 31 mmol/L (ref 22–32)
Calcium: 8.5 mg/dL — ABNORMAL LOW (ref 8.9–10.3)
Chloride: 97 mmol/L — ABNORMAL LOW (ref 101–111)
Creatinine, Ser: 0.75 mg/dL (ref 0.44–1.00)
GFR calc Af Amer: >60 mL/min (ref >60)
GFR calc non Af Amer: >60 mL/min (ref >60)
Glucose, Bld: 111 mg/dL — ABNORMAL HIGH (ref 65–99)
Potassium: 3.7 mmol/L (ref 3.5–5.1)
Sodium: 139 mmol/L (ref 135–145)

## 2015-04-08 LAB — URINE MICROSCOPIC-ADD ON

## 2015-04-08 MED ORDER — NYSTATIN 100000 UNIT/ML MT SUSP
10.0000 mL | Freq: Four times a day (QID) | OROMUCOSAL | Status: DC
  Administered 2015-04-08 – 2015-04-10 (×8): 1000000 [IU] via ORAL
  Filled 2015-04-08 (×9): qty 10

## 2015-04-08 NOTE — Progress Notes (Signed)
Subjective: Patient reports Still with a lot of back and left hip pain centered around her greater trochanteric bursa  Objective: Vital signs in last 24 hours: Temp:  [97.9 F (36.6 C)-100.8 F (38.2 C)] 98.2 F (36.8 C) (11/07 1005) Pulse Rate:  [99-117] 99 (11/07 1005) Resp:  [17-18] 18 (11/07 1005) BP: (91-126)/(59-88) 113/71 mmHg (11/07 1005) SpO2:  [95 %-100 %] 95 % (11/07 1005)  Intake/Output from previous day: 11/06 0701 - 11/07 0700 In: -  Out: 400 [Urine:400] Intake/Output this shift:    Point tenderness around the greater trochanteric bursa some headache and low-grade temperatures.  Lab Results: No results for input(s): WBC, HGB, HCT, PLT in the last 72 hours. BMET No results for input(s): NA, K, CL, CO2, GLUCOSE, BUN, CREATININE, CALCIUM in the last 72 hours.  Studies/Results: No results found.  Assessment/Plan: Continue to mobilize with physical and occupational therapy. We'll check a CT scan of her lumbar spine today. We'll check chest x-ray CBC and BMP. We'll encourage incentive spirometry.  LOS: 7 days     Tayvin Preslar P 04/08/2015, 10:09 AM

## 2015-04-08 NOTE — Progress Notes (Signed)
Occupational Therapy Treatment Patient Details Name: Osborne OmanMyranda Ice MRN: [id number] DOB: 05/16/1976 Today's Date: 04/08/2015    History of present illness Adm 04/01/15 for L3-L5 laminectomies and fusion. Post-op spinal headached with bedrest until 11/3. PMHx- back surgery x 2; anxiety, fibromyalgiz   OT comments  Patient making slow progress towards OT goals. Will continue plan of care for now and would like to see her again tomorrow. Pt limited today due to complaints of nausea, lightheadedness, and being clammy. Therapist gave patient cold wash cloth, put her in a more reclined position, and notified RN of these complaints. Pt will need 24/7 supervision/assistance post acute d/c for her safety.    Follow Up Recommendations  No OT follow up    Equipment Recommendations  Other (comment);Tub/shower bench (AE - reacher, sock aid, LH sponge, LH shoe horn,)    Recommendations for Other Services  None at this time   Precautions / Restrictions Precautions Precautions: Back Precaution Comments: able to recall 3/3 back precautions Required Braces or Orthoses: Spinal Brace Spinal Brace: Lumbar corset;Applied in sitting position Restrictions Weight Bearing Restrictions: No     Mobility Bed Mobility General bed mobility comments: Pt found seated in recliner upon OT entering/exiting room.  Transfers General transfer comment: Pt unable to participate in transfer due to complaints of nausea, lightheadedness, and being clamy.       ADL Overall ADL's : Needs assistance/impaired General ADL Comments: Reviewed tub/shower transfers with patient using 3-in-1. Pt states she does not feel like she can physically step over shower. Educated pt on how to get in/out of shower; putting BSC half in/half out of tub & sitting on edge (keeping feet out b/c pt feels she cannot swing legs over threshold). Also discussed use of tub transfer bench, discussed with patient that she would have to pay out of pocket for  a bench. Pt unable to actively or physically participate in session due to not feeling well. Pt with complaints of nausea, lightheadedness, and being clammy. Asked pt if she wanted to get back to bed and she reported she did not because she recently got up and is wanting to sit up longer. Notified RN of all of this. Pt aware to call for all assistance and mobility needs. Pt's husband present in room entire session.     Cognition   Behavior During Therapy: Anxious Overall Cognitive Status: Within Functional Limits for tasks assessed                 Pertinent Vitals/ Pain       Pain Assessment: Faces Faces Pain Scale: Hurts whole lot Pain Location: back Pain Descriptors / Indicators: Discomfort;Grimacing;Aching Pain Intervention(s): Limited activity within patient's tolerance;Patient requesting pain meds-RN notified   Frequency Min 2X/week     Progress Toward Goals  OT Goals(current goals can now befound in the care plan section)  Progress towards OT goals: Progressing toward goals     Plan Discharge plan remains appropriate    Activity Tolerance Treatment limited secondary to medical complications (Comment)  Patient Left in chair;with call bell/phone within reach;with family/visitor present  Nurse Communication Other (comment) (pt's complaints of nausea, lightheadedness, and being clammy)    Time: 8657-84691420-1437 OT Time Calculation (min): 17 min  Charges: OT General Charges $OT Visit: 1 Procedure OT Treatments $Self Care/Home Management : 8-22 mins  Kenslie Abbruzzese , MS, OTR/L, CLT  04/08/2015, 2:49 PM

## 2015-04-08 NOTE — Care Management Note (Signed)
Case Management Note  Patient Details  Name: [name] MRN: [id number] Date of Birth: 06/16/1975  Subjective/Objective:                    Action/Plan: Patient continues to have issues with pain control and had a low grade fever this am. PT recommending home health PT. CM will continue to follow for discharge needs.   Expected Discharge Date:                  Expected Discharge Plan:  Home/Self Care  In-House Referral:     Discharge planning Services     Post Acute Care Choice:    Choice offered to:     DME Arranged:    DME Agency:     HH Arranged:    HH Agency:     Status of Service:  In process, will continue to follow  Medicare Important Message Given:    Date Medicare IM Given:    Medicare IM give by:    Date Additional Medicare IM Given:    Additional Medicare Important Message give by:     If discussed at Long Length of Stay Meetings, dates discussed:    Additional Comments:  Kermit BaloKelli F Avanna Sowder, RN 04/08/2015, 3:45 PM

## 2015-04-08 NOTE — Progress Notes (Signed)
Physical Therapy Treatment Patient Details Name: [name] MRN: [id number] DOB: 14-Aug-1975 Today's Date: 04/08/2015    History of Present Illness Adm 04/01/15 for L3-L5 laminectomies and fusion. Post-op spinal headached with bedrest until 11/3. PMHx- back surgery x 2; anxiety, fibromyalgia.    PT Comments    Patient continues to perseverate on concerns, issues with pain control, minutia of her daily routine which eats into her therapy sessions.  Feel she has valid concerns, but needs some control over her anxiety of all these issues to make more marked progress.  Also needs precise timing of meds prior to therapies to improve tolerance.  Will work more next session on bed mobility and present options for progressing to step training as has one step to enter her home.    Follow Up Recommendations  Home health PT;Supervision for mobility/OOB     Equipment Recommendations  None recommended by PT    Recommendations for Other Services       Precautions / Restrictions Precautions Precautions: Back Precaution Comments: able to recall 3/3 back precautions Required Braces or Orthoses: Spinal Brace Spinal Brace: Lumbar corset;Applied in sitting position Restrictions Weight Bearing Restrictions: No    Mobility  Bed Mobility Overal bed mobility: Needs Assistance     Sidelying to sit: Min guard (with rail)       General bed mobility comments: pt using UE's for side to sit, but reports only can get so far, then gets stuck and uses rail; Demonstrated sitting to sidelying in bed with cues for fulcrum lowering head at same time as lifting legs to assist with legs into bed  Transfers Overall transfer level: Needs assistance Equipment used: Rolling walker (2 wheeled) Transfers: Sit to/from Stand Sit to Stand: Min assist         General transfer comment: assist for balance, LE's buckling as reaching up to  walker  Ambulation/Gait Ambulation/Gait assistance: Min assist;Min  guard Ambulation Distance (Feet): 12 Feet (x 2) Assistive device: Rolling walker (2 wheeled) Gait Pattern/deviations: Step-to pattern;Shuffle;Antalgic;Trunk flexed     General Gait Details: able to walk initially with lifting feet, the reverted to shuffling, cues for continuing to try and clear feet from floor despite fatigue and pain; hands red from pressure on walker with ambulation; raised walker for pt's height   Stairs            Wheelchair Mobility    Modified Rankin (Stroke Patients Only)       Balance   Sitting-balance support: Bilateral upper extremity supported         Standing balance-Leahy Scale: Poor Standing balance comment: heavy reliance on walker                    Cognition Arousal/Alertness: Awake/alert Behavior During Therapy: Anxious Overall Cognitive Status: Within Functional Limits for tasks assessed                      Exercises      General Comments        Pertinent Vitals/Pain Pain Assessment: Faces Faces Pain Scale: Hurts whole lot Pain Location: back and legs Pain Descriptors / Indicators: Burning;Aching Pain Intervention(s): Limited activity within patient's tolerance;Monitored during session;Premedicated before session;Ice applied    Home Living                      Prior Function            PT Goals (current goals can now be  found in the care plan section) Progress towards PT goals: Progressing toward goals    Frequency  Min 5X/week    PT Plan Current plan remains appropriate    Co-evaluation             End of Session Equipment Utilized During Treatment: Gait belt;Back brace Activity Tolerance: Patient limited by pain;Other (comment) Patient left: in chair;with family/visitor present;with call bell/phone within reach     Time: [phone] PT Time Calculation (min) (ACUTE ONLY): 42 min  Charges:  $Gait Training: 8-22 mins $Therapeutic Activity: 8-22 mins $Self Care/Home  Management: 8-22                    G Codes:      WYNN,CYNDI 04/08/2015, 3:31 PM  Sheran Lawlessyndi Wynn, PT [phone] 04/08/2015

## 2015-04-09 NOTE — Progress Notes (Signed)
Patient ID: [name], female   DOB: 10/12/1975, 39 y.o.   MRN: [id number] Hinda KehrMaranda seems to be doing better but still was not completed her training with physical therapy.  Strength out of 5 wound clean dry and intact  Continue physical outpatient therapy to possible discharge later today or tomorrow.

## 2015-04-09 NOTE — Progress Notes (Signed)
Physical Therapy Treatment Patient Details Name: [name] MRN: [id number] DOB: Nov 01, 1975 Today's Date: 04/09/2015    History of Present Illness Adm 04/01/15 for L3-L5 laminectomies and fusion. Post-op spinal headached with bedrest until 11/3. PMHx- back surgery x 2; anxiety, fibromyalgia.    PT Comments    Patient able to achieve all planned activities for today including practice sit to sidelying, one step for home entry as well as increased gait distance down hallway.  Feel continued PT at home still indicated, but pt much more independent with mobilization this date and would be safe for d/c home with intermittent help and HHPT.  Follow Up Recommendations  Home health PT;Supervision for mobility/OOB     Equipment Recommendations  None recommended by PT    Recommendations for Other Services       Precautions / Restrictions Precautions Precautions: Back Precaution Comments: able to recall 3/3 back precautions Required Braces or Orthoses: Spinal Brace Spinal Brace: Lumbar corset;Applied in sitting position    Mobility  Bed Mobility   Bed Mobility: Sidelying to Sit Rolling: Supervision Sidelying to sit: Supervision     Sit to sidelying: Min assist General bed mobility comments: no rail and HOB flat to come up to sit; to supine difficutly getting feet in bed even with blanket to help lift; discussed possibly using a stool at home to ease transition  Transfers   Equipment used: Rolling walker (2 wheeled) Transfers: Sit to/from Stand Sit to Stand: Supervision         General transfer comment: multiple practice from bed, BSC in bathroom still occasional cues for technique  Ambulation/Gait Ambulation/Gait assistance: Supervision;Min guard Ambulation Distance (Feet): 60 Feet (and 12') Assistive device: Rolling walker (2 wheeled) Gait Pattern/deviations: Step-through pattern;Narrow base of support;Shuffle;Trunk flexed;Antalgic     General Gait Details: cues and  assist at times for upright posture, for relaxing shoulders and for continuing despite dizziness and fatigue.    Stairs Stairs: Yes Stairs assistance: Min assist Stair Management: Backwards;With walker Number of Stairs: 1 General stair comments: repeated x 2 for practice with right foot up first  Wheelchair Mobility    Modified Rankin (Stroke Patients Only)       Balance           Standing balance support: Bilateral upper extremity supported Standing balance-Leahy Scale: Poor Standing balance comment: able to reduce UE reliance at times when standing more upright during standing rest breaks during gait                    Cognition Arousal/Alertness: Awake/alert Behavior During Therapy: WFL for tasks assessed/performed Overall Cognitive Status: Within Functional Limits for tasks assessed                      Exercises      General Comments General comments (skin integrity, edema, etc.): reviewed options for car transfers both to sedan and truck with running boards      Pertinent Vitals/Pain Faces Pain Scale: Hurts even more Pain Location: back and legs  Pain Descriptors / Indicators: Sore Pain Intervention(s): Monitored during session;Repositioned;Ice applied    Home Living                      Prior Function            PT Goals (current goals can now be found in the care plan section) Progress towards PT goals: Progressing toward goals    Frequency  Min 5X/week  PT Plan Current plan remains appropriate    Co-evaluation             End of Session Equipment Utilized During Treatment: Gait belt;Back brace Activity Tolerance: Patient tolerated treatment well Patient left: in bed;with call bell/phone within reach     Time: 0910-1010 PT Time Calculation (min) (ACUTE ONLY): 60 min  Charges:  $Gait Training: 38-52 mins $Therapeutic Activity: 8-22 mins                    G Codes:      [name],CYNDI 04/09/2015, 11:51 AM   Sheran Lawlessyndi Zailynn Brandel, PT [phone] 04/09/2015

## 2015-04-09 NOTE — Progress Notes (Signed)
Occupational Therapy Treatment Patient Details Name: [name] MRN: [id number] DOB: [birth year] Today's Date: 04/09/2015    History of present illness Adm 04/01/15 for L3-L5 laminectomies and fusion. Post-op spinal headached with bedrest until 11/3. PMHx- back surgery x 2; anxiety, fibromyalgia.   OT comments  Pt progressing towards acute OT goals. Focus of session was toilet transfer and pericare, grooming at sink, and bed mobility completed as detailed below. Pt premedicated prior to session. Reviewed ADL education. D/c plan remains appropriate.   Follow Up Recommendations  No OT follow up    Equipment Recommendations  Other (comment);Tub/shower bench (AE - reacher, sock aid, LH sponge, LH shoe horn,))    Recommendations for Other Services      Precautions / Restrictions Precautions Precautions: Back Precaution Comments: able to recall 3/3 back precautions Required Braces or Orthoses: Spinal Brace Spinal Brace: Lumbar corset;Applied in sitting position Restrictions Weight Bearing Restrictions: No       Mobility Bed Mobility Overal bed mobility: Needs Assistance Bed Mobility: Sidelying to Sit;Rolling;Sit to Sidelying Rolling: Supervision Sidelying to sit: Min guard     Sit to sidelying: Min assist General bed mobility comments: min guard at trunk to ensure adherance to precautions. Light min A to advance BLE onto bed.   Transfers Overall transfer level: Needs assistance Equipment used: Rolling walker (2 wheeled) Transfers: Sit to/from Stand Sit to Stand: Supervision         General transfer comment: from EOB, BSC cues for technique at times for hand placement and upright posture    Balance Overall balance assessment: Needs assistance         Standing balance support: Bilateral upper extremity supported;During functional activity Standing balance-Leahy Scale: Poor Standing balance comment: single extremity support during handwashing                   ADL Overall ADL's : Needs assistance/impaired     Grooming: Wash/dry hands;Min guard;Standing Grooming Details (indicate cue type and reason): washed each hand in isolation due to need for external support                 Toilet Transfer: Min guard;Ambulation;BSC;RW Toilet Transfer Details (indicate cue type and reason): simulated home setup Toileting- Clothing Manipulation and Hygiene: Minimal assistance;Sit to/from stand Toileting - Clothing Manipulation Details (indicate cue type and reason): reviewed AE option, min A for balance and to maintain precautions     Functional mobility during ADLs: Min guard;Rolling walker General ADL Comments: Pt moves slowly and cautiously during functional mobility. Bed mobility, toilet transfer, pericare, grooming completed during session as detailed above. Discussed stratagies, AE and family assist as needed for ADLs.       Vision                     Perception     Praxis      Cognition   Behavior During Therapy: [facility] for tasks assessed/performed;Anxious Overall Cognitive Status: Within Functional Limits for tasks assessed                       Extremity/Trunk Assessment               Exercises     Shoulder Instructions       General Comments      Pertinent Vitals/ Pain       Pain Assessment: 0-10 Pain Score: 7  Faces Pain Scale: Hurts even more Pain Location: back and legs Pain Descriptors /  Indicators: Aching;Sore;Tingling Pain Intervention(s): Limited activity within patient's tolerance;Premedicated before session;Repositioned;Monitored during session  Home Living                                          Prior Functioning/Environment              Frequency Min 2X/week     Progress Toward Goals  OT Goals(current goals can now be found in the care plan section)  Progress towards OT goals: Progressing toward goals  Acute Rehab OT Goals Patient Stated Goal: decr  pain and to be able to do things Independently OT Goal Formulation: With patient Time For Goal Achievement: 04/12/15 Potential to Achieve Goals: Good ADL Goals Pt Will Perform Grooming: with supervision;standing Pt Will Perform Lower Body Bathing: with supervision;with adaptive equipment;sit to/from stand Pt Will Perform Lower Body Dressing: with supervision;with adaptive equipment;sit to/from stand Pt Will Transfer to Toilet: with supervision;ambulating;bedside commode Pt Will Perform Toileting - Clothing Manipulation and hygiene: with supervision;with adaptive equipment;sit to/from stand Pt Will Perform Tub/Shower Transfer: with supervision;3 in 1 Additional ADL Goal #1: Pt will generalize back precautions in ADL and mobility independently.  Plan Discharge plan remains appropriate    Co-evaluation                 End of Session Equipment Utilized During Treatment: Gait belt;Rolling walker;Back brace   Activity Tolerance Patient tolerated treatment well   Patient Left in bed;with call bell/phone within reach;with family/visitor present   Nurse Communication          Time: 4098-11911144-1220 OT Time Calculation (min): 36 min  Charges: OT General Charges $OT Visit: 1 Procedure OT Treatments $Self Care/Home Management : 23-37 mins  [name], [name] 04/09/2015, 12:37 PM

## 2015-04-10 ENCOUNTER — Encounter: Payer: Self-pay | Admitting: *Deleted

## 2015-04-10 MED ORDER — ONDANSETRON 4 MG PO TBDP: 4.0000 mg | ORAL_TABLET | Freq: Three times a day (TID) | ORAL | Status: DC | PRN

## 2015-04-10 MED ORDER — HYDROMORPHONE HCL 4 MG PO TABS: 4.0000 mg | ORAL_TABLET | ORAL | Status: DC | PRN

## 2015-04-10 NOTE — Progress Notes (Signed)
Patient ID: [name], female   DOB: 03/24/1976, 39 y.o.   MRN: [id number] Doing well minimal headache  Neurologically stable wound clean dry and intact  Discharge home

## 2015-04-10 NOTE — Discharge Instructions (Signed)
No lifting no bending no twisting no driving °

## 2015-04-10 NOTE — Progress Notes (Signed)
PT Cancellation Note  Patient Details Name: Osborne OmanMyranda Sesma MRN: [id number] DOB: 04/24/1976   Cancelled Treatment:    Reason Eval/Treat Not Completed: Other (comment) (Pt is preparing for d/c).   Michail JewelsAshley Parr PT, DPT [phone] Pager: [phone] 04/10/2015, 3:07 PM

## 2015-04-10 NOTE — Care Management Note (Signed)
Case Management Note  Patient Details  Name: [name] MRN: [id number] Date of Birth: 06-18-1975  Subjective/Objective:                    Action/Plan: Patient being discharged home today with orders for home health PT orders. CM met with the patient and her husband and provided them a list of home health agencies in the [facility] area. They selected [place]. Stephanie with [facility] notified and accepted the referral. Patient was also ordered a tub bench. [employer] will not cover this and the patient was informed but would still like to receive the equipment. Jermaine with [facility] DME notified and is going to deliver the equipment to the patients room. Updated bedside RN.   Expected Discharge Date:                  Expected Discharge Plan:  [place]  In-House Referral:     Discharge planning Services  CM Consult  Post Acute Care Choice:    Choice offered to:  Patient, Spouse  DME Arranged:  Tub bench DME Agency:  [place]:  PT [place] Agency:  [place]  Status of Service:  Completed, signed off  Medicare Important Message Given:    Date Medicare IM Given:    Medicare IM give by:    Date Additional Medicare IM Given:    Additional Medicare Important Message give by:     If discussed at [place] of Stay Meetings, dates discussed:    Additional Comments:  Pollie Friar, RN 04/10/2015, 2:12 PM

## 2015-04-10 NOTE — Discharge Summary (Signed)
Physician Discharge Summary  Patient ID: [name] MRN: [id number] DOB/AGE: 02/18/1976 39 y.o.  Admit date: 04/01/2015 Discharge date: 04/10/2015  Admission Diagnoses: Lumbar degenerative disease spinal stenosis and instability L3-4 L4-5  Discharge Diagnoses: Same Active Problems:   Spinal stenosis of lumbar region   Discharged Condition: good  Hospital Course: Patient is admitted hospital underwent decompressive laminectomy and fusion L3-4 L4-5 postoperative is barely well was kept flat for the first 48 hours mobilized had some headache had a lot of pain control issues had a lot of anxiety issues all this contributed to a prolonged hospitalization without any definite compensating features. She never developed a sudden dramatic CSF leak she has some radiculitis controlled with steroids she was progressively mobilize in physical therapy and stable for discharge home on postoperative day 10.  Consults: Significant Diagnostic Studies: Treatments: Decompression stabilization procedure L3-L5 Discharge Exam: Blood pressure 115/75, pulse 114, temperature 98.1 F (36.7 C), temperature source Oral, resp. rate 20, height  (1.753 m), weight 122.471 kg (270 lb), last menstrual period 12/13/1999, SpO2 98 %. Strength out of 5 wound clean dry and intact  Disposition: Home  Discharge Instructions    Face-to-face encounter (required for Medicare/Medicaid patients)    Complete by:  As directed   I Demetreus Lothamer P certify that this patient is under my care and that I, or a nurse practitioner or physician's assistant working with me, had a face-to-face encounter that meets the physician face-to-face encounter requirements with this patient on 04/10/2015. The encounter with the patient was in whole, or in part for the following medical condition(s) which is the primary reason for home health care (List medical condition): Lumbar degenerative disc disease spinal stenosis  The encounter with the  patient was in whole, or in part, for the following medical condition, which is the primary reason for home health care:  Lumbar degenerative disc disease and spinal stenosis  I certify that, based on my findings, the following services are medically necessary home health services:  Physical therapy  Reason for Medically Necessary Home Health Services:  Therapy- Home Adaptation to Facilitate Safety  My clinical findings support the need for the above services:  Pain interferes with ambulation/mobility  Further, I certify that my clinical findings support that this patient is homebound due to:  Pain interferes with ambulation/mobility     Home Health    Complete by:  As directed   To provide the following care/treatments:  PT     Shower chair    Complete by:  As directed             Medication List    TAKE these medications        ALPRAZolam 0.5 MG tablet  Commonly known as:  XANAX  Take 1 tablet (0.5 mg total) by mouth 3 (three) times daily as needed for anxiety.     BD INTEGRA SYRINGE 25G X 1" 3 ML Misc  Generic drug:  SYRINGE-NEEDLE (DISP) 3 ML  USE AS DIRECTED     calcium carbonate 500 MG chewable tablet  Commonly known as:  TUMS - dosed in mg elemental calcium  Chew 1 tablet by mouth 2 (two) times daily as needed for indigestion or heartburn.     cyanocobalamin 1000 MCG/ML injection  Commonly known as:  (VITAMIN B-12)  INJECT 1 ML (1,000 MCG TOTAL) INTO THE MUSCLE EVERY 7 DAYS.     cyclobenzaprine 10 MG tablet  Commonly known as:  FLEXERIL  Take 10 mg by mouth every  8 (eight) hours as needed for muscle spasms. PATIENT TAKES FOR MUSCLE SPASMS     fluticasone 50 MCG/ACT nasal spray  Commonly known as:  FLONASE  Place 2 sprays into both nostrils daily.     gabapentin 300 MG capsule  Commonly known as:  NEURONTIN  Take 1 capsule (300 mg total) by mouth 3 (three) times daily.     hydrochlorothiazide 25 MG tablet  Commonly known as:  HYDRODIURIL  TAKE 1 TABLET BY MOUTH  EVERY DAY     HYDROmorphone 4 MG tablet  Commonly known as:  DILAUDID  Take 1 tablet (4 mg total) by mouth every 3 (three) hours as needed for severe pain.     MULTI-VITAMIN GUMMIES PO  Take 2 tablets by mouth daily.     oxyCODONE-acetaminophen 10-325 MG tablet  Commonly known as:  PERCOCET  Take 1 tablet by mouth every 6 (six) hours as needed for pain.     pantoprazole 40 MG tablet  Commonly known as:  PROTONIX  TAKE 1 TABLET BY MOUTH TWICE A DAY     promethazine 25 MG tablet  Commonly known as:  PHENERGAN  Take 1 tablet (25 mg total) by mouth every 6 (six) hours as needed for nausea. PATIENT TAKES FOR NAUSEA     simethicone 80 MG chewable tablet  Commonly known as:  MYLICON  Chew 160 mg by mouth every 6 (six) hours as needed (gas pain).     sucralfate 1 GM/10ML suspension  Commonly known as:  CARAFATE  Take 10 mLs (1 g total) by mouth 4 (four) times daily -  with meals and at bedtime.     SUMAtriptan 50 MG tablet  Commonly known as:  IMITREX  TAKE 1 TABLET BY MOUTH AT ONSET OF HEADACHE MAY REPEAT ONCE IN 2 HOURS IF PERSISTS OR RECURS     Vitamin D3 2000 UNITS Tabs  Take 1 tablet by mouth daily.           Follow-up Information    [facility],Jessicca Stitzer P, MD.   Specialty:  Neurosurgery   Contact information:   [address] [place] [address] [phone]       Signed: Mariam DollarCRAM,Major [place]ter[place] P 04/10/2015, 12:45 PM

## 2015-04-10 NOTE — Progress Notes (Signed)
Discharge orders received. Pt educated on discharge instructions. Pt verbalized understanding and given discharge packet and prescriptions. Pt and belongings taken downstairs by staff via wheelchair.

## 2015-04-22 ENCOUNTER — Other Ambulatory Visit: Payer: Self-pay | Admitting: Family Medicine

## 2015-04-22 ENCOUNTER — Encounter: Payer: Self-pay | Admitting: Internal Medicine

## 2015-05-06 ENCOUNTER — Telehealth: Payer: Self-pay | Admitting: Family Medicine

## 2015-05-06 NOTE — Telephone Encounter (Signed)
Pt would like to know if dr fry will accept her 40 yr old son. Pt has aged out of pediatrician. Can I sch? Pt son is on ADD med

## 2015-05-07 NOTE — Telephone Encounter (Signed)
Pt son has been sch °

## 2015-05-07 NOTE — Telephone Encounter (Signed)
Yes I can see him  ?

## 2015-05-15 ENCOUNTER — Ambulatory Visit (HOSPITAL_COMMUNITY)
Admission: RE | Admit: 2015-05-15 | Discharge: 2015-05-15 | Disposition: A | Discharge status: Home / Self Care | Payer: BLUE CROSS/BLUE SHIELD | Source: Ambulatory Visit | Attending: Vascular Surgery | Admitting: Vascular Surgery

## 2015-05-15 ENCOUNTER — Other Ambulatory Visit (HOSPITAL_COMMUNITY): Payer: Self-pay | Admitting: Neurosurgery

## 2015-05-15 DIAGNOSIS — M7989 Other specified soft tissue disorders: Secondary | ICD-10-CM

## 2015-05-15 DIAGNOSIS — M79604 Pain in right leg: Secondary | ICD-10-CM | POA: Insufficient documentation

## 2015-05-15 DIAGNOSIS — M79605 Pain in left leg: Secondary | ICD-10-CM | POA: Diagnosis not present

## 2015-05-27 ENCOUNTER — Encounter: Payer: Self-pay | Admitting: Podiatry

## 2015-05-27 ENCOUNTER — Ambulatory Visit (INDEPENDENT_AMBULATORY_CARE_PROVIDER_SITE_OTHER): Payer: BLUE CROSS/BLUE SHIELD | Admitting: Podiatry

## 2015-05-27 VITALS — BP 116/72 | HR 68 | Resp 16

## 2015-05-27 DIAGNOSIS — B351 Tinea unguium: Secondary | ICD-10-CM

## 2015-05-27 DIAGNOSIS — L600 Ingrowing nail: Secondary | ICD-10-CM | POA: Diagnosis not present

## 2015-05-29 ENCOUNTER — Telehealth: Payer: Self-pay | Admitting: *Deleted

## 2015-05-29 NOTE — Telephone Encounter (Signed)
Called patient at [phone] (Cell #) to check to see how they were doing from their ingrown toenail procedure that was performed on Monday, May 27, 2015. Pt stated, "Everything is good and in no pain".

## 2015-05-30 NOTE — Progress Notes (Signed)
Subjective:     Patient ID: [name], female   DOB: 02/27/1976, 39 y.o.   MRN: [id number]  HPI patient presents stating that her big toenail with splitting and she was concerned about infection or damage   Review of Systems     Objective:   Physical Exam Neurovascular status intact negative Homans sign was noted with patient having a distal nail bed right that is split across the distal two thirds with no current drainage redness or discoloration noted    Assessment:     Traumatized right hallux nail with irritation of the distal bed    Plan:     Carefully remove the distal bed with sterile instrumentation and applied sterile dressing. It looks clean underneath and patient will be seen back as needed

## 2015-06-15 ENCOUNTER — Other Ambulatory Visit: Payer: Self-pay | Admitting: Family Medicine

## 2015-06-20 ENCOUNTER — Ambulatory Visit: Payer: BLUE CROSS/BLUE SHIELD | Admitting: Neurology

## 2015-07-01 ENCOUNTER — Ambulatory Visit: Payer: BLUE CROSS/BLUE SHIELD | Admitting: Neurology

## 2015-07-15 ENCOUNTER — Other Ambulatory Visit: Payer: Self-pay | Admitting: Family Medicine

## 2015-07-29 ENCOUNTER — Encounter: Payer: Self-pay | Admitting: Family Medicine

## 2015-07-29 ENCOUNTER — Other Ambulatory Visit: Payer: Self-pay | Admitting: Family Medicine

## 2015-07-29 ENCOUNTER — Telehealth: Payer: Self-pay | Admitting: Family Medicine

## 2015-07-29 NOTE — Telephone Encounter (Signed)
Patient has a bladder infection, she is using the bathroom very frequently would Dr Clent Ridges call in an antibiotic to her pharmacy, [facility] on [address]

## 2015-07-29 NOTE — Telephone Encounter (Signed)
Call in Cipro 500 mg bid for 7 days  

## 2015-07-30 NOTE — Telephone Encounter (Signed)
Rx called in as directed.   

## 2015-07-31 ENCOUNTER — Other Ambulatory Visit: Payer: Self-pay | Admitting: Family Medicine

## 2015-08-06 ENCOUNTER — Other Ambulatory Visit: Payer: Self-pay | Admitting: Neurosurgery

## 2015-08-06 DIAGNOSIS — M5137 Other intervertebral disc degeneration, lumbosacral region: Secondary | ICD-10-CM

## 2015-08-20 ENCOUNTER — Ambulatory Visit
Admission: RE | Admit: 2015-08-20 | Discharge: 2015-08-20 | Disposition: A | Discharge status: Home / Self Care | Payer: BLUE CROSS/BLUE SHIELD | Source: Ambulatory Visit | Attending: Neurosurgery | Admitting: Neurosurgery

## 2015-08-20 DIAGNOSIS — M5137 Other intervertebral disc degeneration, lumbosacral region: Secondary | ICD-10-CM

## 2015-08-28 ENCOUNTER — Encounter (HOSPITAL_COMMUNITY): Payer: Self-pay | Admitting: *Deleted

## 2015-08-28 ENCOUNTER — Emergency Department (HOSPITAL_COMMUNITY): Payer: BLUE CROSS/BLUE SHIELD

## 2015-08-28 ENCOUNTER — Emergency Department (HOSPITAL_COMMUNITY)
Admission: EM | Admit: 2015-08-28 | Discharge: 2015-08-28 | Disposition: A | Discharge status: Home / Self Care | Payer: BLUE CROSS/BLUE SHIELD | Attending: Emergency Medicine | Admitting: Emergency Medicine

## 2015-08-28 DIAGNOSIS — R1010 Upper abdominal pain, unspecified: Secondary | ICD-10-CM | POA: Diagnosis not present

## 2015-08-28 DIAGNOSIS — R079 Chest pain, unspecified: Secondary | ICD-10-CM | POA: Insufficient documentation

## 2015-08-28 DIAGNOSIS — Z87442 Personal history of urinary calculi: Secondary | ICD-10-CM | POA: Insufficient documentation

## 2015-08-28 DIAGNOSIS — Z79899 Other long term (current) drug therapy: Secondary | ICD-10-CM | POA: Diagnosis not present

## 2015-08-28 DIAGNOSIS — K219 Gastro-esophageal reflux disease without esophagitis: Secondary | ICD-10-CM | POA: Diagnosis not present

## 2015-08-28 DIAGNOSIS — F419 Anxiety disorder, unspecified: Secondary | ICD-10-CM | POA: Diagnosis not present

## 2015-08-28 DIAGNOSIS — Z8679 Personal history of other diseases of the circulatory system: Secondary | ICD-10-CM | POA: Diagnosis not present

## 2015-08-28 DIAGNOSIS — G43909 Migraine, unspecified, not intractable, without status migrainosus: Secondary | ICD-10-CM | POA: Insufficient documentation

## 2015-08-28 DIAGNOSIS — Z87891 Personal history of nicotine dependence: Secondary | ICD-10-CM | POA: Insufficient documentation

## 2015-08-28 DIAGNOSIS — G629 Polyneuropathy, unspecified: Secondary | ICD-10-CM | POA: Diagnosis not present

## 2015-08-28 DIAGNOSIS — M797 Fibromyalgia: Secondary | ICD-10-CM | POA: Diagnosis not present

## 2015-08-28 DIAGNOSIS — E538 Deficiency of other specified B group vitamins: Secondary | ICD-10-CM | POA: Diagnosis not present

## 2015-08-28 DIAGNOSIS — F329 Major depressive disorder, single episode, unspecified: Secondary | ICD-10-CM | POA: Insufficient documentation

## 2015-08-28 LAB — BASIC METABOLIC PANEL
Anion gap: 11 (ref 5–15)
BUN: 16 mg/dL (ref 6–20)
CO2: 27 mmol/L (ref 22–32)
Calcium: 9.3 mg/dL (ref 8.9–10.3)
Chloride: 103 mmol/L (ref 101–111)
Creatinine, Ser: 1.05 mg/dL — ABNORMAL HIGH (ref 0.44–1.00)
GFR calc Af Amer: >60 mL/min (ref >60)
GFR calc non Af Amer: >60 mL/min (ref >60)
Glucose, Bld: 157 mg/dL — ABNORMAL HIGH (ref 65–99)
Potassium: 3.4 mmol/L — ABNORMAL LOW (ref 3.5–5.1)
Sodium: 141 mmol/L (ref 135–145)

## 2015-08-28 LAB — CBC
HCT: 43.4 % (ref 36.0–46.0)
Hemoglobin: 14.8 g/dL (ref 12.0–15.0)
MCH: 28.8 pg (ref 26.0–34.0)
MCHC: 34.1 g/dL (ref 30.0–36.0)
MCV: 84.6 fL (ref 78.0–100.0)
Platelets: 384 10*3/uL (ref 150–400)
RBC: 5.13 MIL/uL — ABNORMAL HIGH (ref 3.87–5.11)
RDW: 13.1 % (ref 11.5–15.5)
WBC: 12.7 10*3/uL — ABNORMAL HIGH (ref 4.0–10.5)

## 2015-08-28 LAB — I-STAT TROPONIN, ED: Troponin i, poc: 0 ng/mL (ref 0.00–0.08)

## 2015-08-28 LAB — HEPATIC FUNCTION PANEL
ALT: 40 U/L (ref 14–54)
AST: 102 U/L — ABNORMAL HIGH (ref 15–41)
Albumin: 4.2 g/dL (ref 3.5–5.0)
Alkaline Phosphatase: 67 U/L (ref 38–126)
Bilirubin, Direct: 0.2 mg/dL (ref 0.1–0.5)
Indirect Bilirubin: 0.4 mg/dL (ref 0.3–0.9)
Total Bilirubin: 0.6 mg/dL (ref 0.3–1.2)
Total Protein: 7.7 g/dL (ref 6.5–8.1)

## 2015-08-28 LAB — LIPASE, BLOOD: Lipase: 65 U/L — ABNORMAL HIGH (ref 11–51)

## 2015-08-28 MED ORDER — SUCRALFATE 1 GM/10ML PO SUSP: 1.0000 g | Freq: Three times a day (TID) | ORAL | Status: DC

## 2015-08-28 MED ORDER — ONDANSETRON HCL 4 MG/2ML IJ SOLN
4.0000 mg | Freq: Once | INTRAMUSCULAR | Status: AC
  Administered 2015-08-28: 4 mg via INTRAVENOUS
  Filled 2015-08-28: qty 2

## 2015-08-28 MED ORDER — LORAZEPAM 1 MG PO TABS: 1.0000 mg | ORAL_TABLET | Freq: Three times a day (TID) | ORAL | Status: DC | PRN

## 2015-08-28 MED ORDER — OXYCODONE-ACETAMINOPHEN 5-325 MG PO TABS: 1.0000 | ORAL_TABLET | ORAL | Status: DC | PRN

## 2015-08-28 MED ORDER — HYDROMORPHONE HCL 1 MG/ML IJ SOLN
1.0000 mg | Freq: Once | INTRAMUSCULAR | Status: AC
Start: 2015-08-28 — End: 2015-08-28
  Administered 2015-08-28: 1 mg via INTRAVENOUS
  Filled 2015-08-28: qty 1

## 2015-08-28 MED ORDER — GI COCKTAIL ~~LOC~~
30.0000 mL | Freq: Once | ORAL | Status: AC
  Administered 2015-08-28: 30 mL via ORAL
  Filled 2015-08-28: qty 30

## 2015-08-28 MED ORDER — IOHEXOL 300 MG/ML  SOLN
25.0000 mL | Freq: Once | INTRAMUSCULAR | Status: AC | PRN
  Administered 2015-08-28: 25 mL via ORAL

## 2015-08-28 MED ORDER — HYDROMORPHONE HCL 1 MG/ML IJ SOLN
1.0000 mg | Freq: Once | INTRAMUSCULAR | Status: AC
  Administered 2015-08-28: 1 mg via INTRAVENOUS
  Filled 2015-08-28: qty 1

## 2015-08-28 MED ORDER — IOPAMIDOL (ISOVUE-300) INJECTION 61%
100.0000 mL | Freq: Once | INTRAVENOUS | Status: AC | PRN
  Administered 2015-08-28: 100 mL via INTRAVENOUS

## 2015-08-28 NOTE — ED Notes (Addendum)
Pt wanted verification that pain med would relieve all of her pain. I explained that based on diagnosis , pain relief could not be guaranteed but meds were given to help.  Explained that MD ruled out emergent reasons for chest pain at this time. Medication scripts were given to help with pain.  Recommended patient to follow up with gastroenterology.  Pt stated that ER MD felt pain related to hx of esophagitis. Explained that if pain continues and not relieved by meds, come back to ER.  Pt continued to question RN multiple times although instructions had been given.  Pt d/c with instructions from MD.

## 2015-08-28 NOTE — ED Notes (Signed)
Pt upset with RN at d/c.  Spoke with Victorino DikeJennifer AD regarding why pt was upset and circumstances. Victorino DikeJennifer had also been in room regarding patient care.

## 2015-08-28 NOTE — Discharge Instructions (Signed)
Nonspecific Chest Pain  °Chest pain can be caused by many different conditions. There is always a chance that your pain could be related to something serious, such as a heart attack or a blood clot in your lungs. Chest pain can also be caused by conditions that are not life-threatening. If you have chest pain, it is very important to follow up with your health care provider. °CAUSES  °Chest pain can be caused by: °· Heartburn. °· Pneumonia or bronchitis. °· Anxiety or stress. °· Inflammation around your heart (pericarditis) or lung (pleuritis or pleurisy). °· A blood clot in your lung. °· A collapsed lung (pneumothorax). It can develop suddenly on its own (spontaneous pneumothorax) or from trauma to the chest. °· Shingles infection (varicella-zoster virus). °· Heart attack. °· Damage to the bones, muscles, and cartilage that make up your chest wall. This can include: °¨ Bruised bones due to injury. °¨ Strained muscles or cartilage due to frequent or repeated coughing or overwork. °¨ Fracture to one or more ribs. °¨ Sore cartilage due to inflammation (costochondritis). °RISK FACTORS  °Risk factors for chest pain may include: °· Activities that increase your risk for trauma or injury to your chest. °· Respiratory infections or conditions that cause frequent coughing. °· Medical conditions or overeating that can cause heartburn. °· Heart disease or family history of heart disease. °· Conditions or health behaviors that increase your risk of developing a blood clot. °· Having had chicken pox (varicella zoster). °SIGNS AND SYMPTOMS °Chest pain can feel like: °· Burning or tingling on the surface of your chest or deep in your chest. °· Crushing, pressure, aching, or squeezing pain. °· Dull or sharp pain that is worse when you move, cough, or take a deep breath. °· Pain that is also felt in your back, neck, shoulder, or arm, or pain that spreads to any of these areas. °Your chest pain may come and go, or it may stay  constant. °DIAGNOSIS °Lab tests or other studies may be needed to find the cause of your pain. Your health care provider may have you take a test called an ambulatory ECG (electrocardiogram). An ECG records your heartbeat patterns at the time the test is performed. You may also have other tests, such as: °· Transthoracic echocardiogram (TTE). During echocardiography, sound waves are used to create a picture of all of the heart structures and to look at how blood flows through your heart. °· Transesophageal echocardiogram (TEE). This is a more advanced imaging test that obtains images from inside your body. It allows your health care provider to see your heart in finer detail. °· Cardiac monitoring. This allows your health care provider to monitor your heart rate and rhythm in real time. °· Holter monitor. This is a portable device that records your heartbeat and can help to diagnose abnormal heartbeats. It allows your health care provider to track your heart activity for several days, if needed. °· Stress tests. These can be done through exercise or by taking medicine that makes your heart beat more quickly. °· Blood tests. °· Imaging tests. °TREATMENT  °Your treatment depends on what is causing your chest pain. Treatment may include: °· Medicines. These may include: °¨ Acid blockers for heartburn. °¨ Anti-inflammatory medicine. °¨ Pain medicine for inflammatory conditions. °¨ Antibiotic medicine, if an infection is present. °¨ Medicines to dissolve blood clots. °¨ Medicines to treat coronary artery disease. °· Supportive care for conditions that do not require medicines. This may include: °¨ Resting. °¨ Applying heat   or cold packs to injured areas. °¨ Limiting activities until pain decreases. °HOME CARE INSTRUCTIONS °· If you were prescribed an antibiotic medicine, finish it all even if you start to feel better. °· Avoid any activities that bring on chest pain. °· Do not use any tobacco products, including  cigarettes, chewing tobacco, or electronic cigarettes. If you need help quitting, ask your health care provider. °· Do not drink alcohol. °· Take medicines only as directed by your health care provider. °· Keep all follow-up visits as directed by your health care provider. This is important. This includes any further testing if your chest pain does not go away. °· If heartburn is the cause for your chest pain, you may be told to keep your head raised (elevated) while sleeping. This reduces the chance that acid will go from your stomach into your esophagus. °· Make lifestyle changes as directed by your health care provider. These may include: °¨ Getting regular exercise. Ask your health care provider to suggest some activities that are safe for you. °¨ Eating a heart-healthy diet. A registered dietitian can help you to learn healthy eating options. °¨ Maintaining a healthy weight. °¨ Managing diabetes, if necessary. °¨ Reducing stress. °SEEK MEDICAL CARE IF: °· Your chest pain does not go away after treatment. °· You have a rash with blisters on your chest. °· You have a fever. °SEEK IMMEDIATE MEDICAL CARE IF:  °· Your chest pain is worse. °· You have an increasing cough, or you cough up blood. °· You have severe abdominal pain. °· You have severe weakness. °· You faint. °· You have chills. °· You have sudden, unexplained chest discomfort. °· You have sudden, unexplained discomfort in your arms, back, neck, or jaw. °· You have shortness of breath at any time. °· You suddenly start to sweat, or your skin gets clammy. °· You feel nauseous or you vomit. °· You suddenly feel light-headed or dizzy. °· Your heart begins to beat quickly, or it feels like it is skipping beats. °These symptoms may represent a serious problem that is an emergency. Do not wait to see if the symptoms will go away. Get medical help right away. Call your local emergency services (911 in the [place] Do not drive yourself to the hospital. °  °This  information is not intended to replace advice given to you by your health care provider. Make sure you discuss any questions you have with your health care provider. °  °Document Released: 02/25/2005 Document Revised: 06/08/2014 Document Reviewed: 12/22/2013 °Elsevier Interactive Patient Education ©2016 [employer] ° °

## 2015-08-28 NOTE — ED Notes (Signed)
Patient transported to CT 

## 2015-08-28 NOTE — ED Notes (Signed)
Pt states that she ate around 20:30pm and began having substernal chest pain around 2100; pt states that she has taken Pepcid and carafate and 2 Percocet 10 / 325mg  without relief of symptoms; pt cryng and tearful in triage; pt denies shortness of breath or N/V

## 2015-08-28 NOTE — ED Notes (Addendum)
Writer had two unsuccessful attempt for blood draws.  

## 2015-08-28 NOTE — ED Provider Notes (Addendum)
CSN: [id number]     Arrival date & time 08/28/15  0023 History   By signing my name below, I, Arlan Organ, attest that this documentation has been prepared under the direction and in the presence of Gilda Crease, MD.  Electronically Signed: Arlan Organ, ED Scribe. 08/28/2015. 3:02 AM.   Chief Complaint  Patient presents with  . Chest Pain   The history is provided by the patient. No language interpreter was used.    HPI Comments: Sahira Cataldi is a 40 y.o. female with a PMHx of GERD who presents to the Emergency Department complaining of constant, ongoing substernal chest pain that radiates to the back onset 20:30 PM after eating. Pain is described as burning and sharp. No aggravating or alleviating factors reported. OTC Pepcid and Carafate attempted at home without any improvement. 2 Percocet also attempted with no relief. Pt was then given a GI cocktail with some improvement while in triage. No recent fever, chills, nausea, vomiting, abdominal pain, diarrhea, or shortness of breath. Pt has a PMHx of esophagitis and states today's discomfort feels similar.  PCP: [name], MD    Past Medical History  Diagnosis Date  . Allergy   . GERD (gastroesophageal reflux disease)   . Polycystic ovaries   . B12 deficiency   . Peripheral neuropathy (HCC)   . Fibromyalgia   . Depression   . Migraine   . Anxiety   . Intervertebral disc protrusion 01/11/2014  . Dysrhythmia     hx tachy  . Esophagitis     rx  . History of kidney stones    Past Surgical History  Procedure Laterality Date  . Appendectomy    . Vaginal hysterectomy    . Tonsillectomy    . Sinusotomy  12/14/06  . Wrist ganglion excision Left   . Ovarian cyst removal    . Oophorectomy      left overy  . Rectocele repair    . Bladder tack  2012  . Back surgery  2012    left laminectomy at L3-4 per Dr. Phoebe Perch   . Lumbar disc surgery  01-31-14    per Dr. [name]    Family History  Problem Relation Age of Onset  .  Fibromyalgia Mother   . Diabetes Mother   . Thyroid cancer Mother   . Liver disease Mother   . Neuropathy Mother   . Hypertension Father   . Diabetes Father   . Heart disease Father   . Colon cancer Neg Hx   . Prostate cancer Father   . Cirrhosis Mother     non-alcoholic  . Pulmonary embolism Mother     both lungs  . Heart attack Father     x 2  . Other Neg Hx     pheochromocytoma   Social History  Substance Use Topics  . Smoking status: Former Smoker    Types: Cigarettes    Quit date: 06/01/1998  . Smokeless tobacco: Never Used  . Alcohol Use: No   OB History    Gravida Para Term Preterm AB TAB SAB Ectopic Multiple Living   0     2     Review of Systems  Constitutional: Negative for fever and chills.  Respiratory: Negative for cough and shortness of breath.   Cardiovascular: Positive for chest pain.  Gastrointestinal: Negative for nausea, vomiting and abdominal pain.  Neurological: Negative for headaches.  Psychiatric/Behavioral: Negative for confusion.  All other systems reviewed and are  negative.     Allergies  Gadolinium derivatives  Home Medications   Prior to Admission medications   Medication Sig Start Date End Date Taking? Authorizing Provider  BD INTEGRA SYRINGE 25G X 1" 3 ML MISC USE AS DIRECTED 10/24/14  Yes [name], MD  calcium carbonate (TUMS - DOSED IN MG ELEMENTAL CALCIUM) 500 MG chewable tablet Chew 1 tablet by mouth 2 (two) times daily as needed for indigestion or heartburn.   Yes Historical Provider, MD  Cholecalciferol (VITAMIN D3) 2000 UNITS TABS Take 2,000 Units by mouth daily.    Yes Historical Provider, MD  cyanocobalamin (,VITAMIN B-12,) 1000 MCG/ML injection INJECT 1 ML (1,000 MCG TOTAL) INTO THE MUSCLE EVERY 7 DAYS. 10/24/14  Yes [name], MD  diazepam (VALIUM) 5 MG tablet Take 5-10 mg by mouth every 6 (six) hours as needed for muscle spasms.  06/20/15  Yes Historical Provider, MD  fluticasone (FLONASE) 50 MCG/ACT nasal  spray Place 2 sprays into both nostrils daily as needed for allergies.    Yes Historical Provider, MD  gabapentin (NEURONTIN) 100 MG capsule Take 100 mg by mouth 3 (three) times daily. 08/06/15  Yes Historical Provider, MD  hydrochlorothiazide (HYDRODIURIL) 25 MG tablet TAKE 1 TABLET BY MOUTH EVERY DAY Patient taking differently: TAKE 25 MG BY MOUTH EVERY DAY 02/12/15  Yes [name], MD  Multiple Vitamins-Minerals (MULTI-VITAMIN GUMMIES PO) Take 2 tablets by mouth daily.    Yes Historical Provider, MD  ondansetron (ZOFRAN ODT) 4 MG disintegrating tablet Take 1 tablet (4 mg total) by mouth every 8 (eight) hours as needed for nausea or vomiting. 04/10/15  Yes Donalee Citrin, MD  oxyCODONE-acetaminophen (PERCOCET) 10-325 MG per tablet Take 1 tablet by mouth every 6 (six) hours as needed for pain. 11/30/13  Yes [name], MD  pantoprazole (PROTONIX) 40 MG tablet TAKE 1 TABLET BY MOUTH TWICE A DAY Patient taking differently: TAKE 40 MG BY MOUTH TWICE A DAY 06/17/15  Yes [name], MD  promethazine (PHENERGAN) 25 MG tablet TAKE 1 TABLET BY MOUTH EVERY 6 HOURS AS NEEDED FOR NAUSEA Patient taking differently: TAKE 25 MG BY MOUTH EVERY 6 HOURS AS NEEDED FOR NAUSEA 07/15/15  Yes [name], MD  simethicone (MYLICON) 80 MG chewable tablet Chew 160 mg by mouth every 6 (six) hours as needed (gas pain).   Yes Historical Provider, MD  sucralfate (CARAFATE) 1 GM/10ML suspension Take 10 mLs (1 g total) by mouth 4 (four) times daily -  with meals and at bedtime. Patient taking differently: Take 1 g by mouth 3 (three) times daily as needed (stomach pain).  05/17/14  Yes Amy S Esterwood, PA-C  SUMAtriptan (IMITREX) 50 MG tablet TAKE 1 TABLET BY MOUTH AT ONSET OF HEADACHE MAY REPEAT ONCE IN 2 HOURS IF PERSISTS OR RECURS Patient taking differently: TAKE 150 MG BY MOUTH AT ONSET OF HEADACHE MAY REPEAT ONCE IN 2 HOURS IF PERSISTS OR RECURS 01/15/15  Yes [name], DO  ALPRAZolam (XANAX) 0.5 MG tablet Take 1 tablet (0.5  mg total) by mouth 3 (three) times daily as needed for anxiety. Patient not taking: Reported on 08/28/2015 03/26/15   [name], MD  ciprofloxacin (CIPRO) 500 MG tablet TAKE 1 TABLET BY MOUTH TWICE A DAY Patient not taking: Reported on 08/28/2015 07/30/15   [name], MD  gabapentin (NEURONTIN) 300 MG capsule TAKE ONE CAPSULE BY MOUTH 3 TIMES A DAY Patient not taking: Reported on 08/28/2015 07/31/15  [name], MD   Triage Vitals: BP 144/99 mmHg  Pulse 96  Temp(Src) 98.7 F (37.1 C) (Oral)  Resp 14  SpO2 97%  LMP 12/13/1999   Physical Exam  Constitutional: She is oriented to person, place, and time. She appears well-developed and well-nourished. No distress.  HENT:  Head: Normocephalic and atraumatic.  Right Ear: Hearing normal.  Left Ear: Hearing normal.  Nose: Nose normal.  Mouth/Throat: Oropharynx is clear and moist and mucous membranes are normal.  Eyes: Conjunctivae and EOM are normal. Pupils are equal, round, and reactive to light.  Neck: Normal range of motion. Neck supple.  Cardiovascular: Regular rhythm, S1 normal and S2 normal.  Exam reveals no gallop and no friction rub.   No murmur heard. Pulmonary/Chest: Effort normal and breath sounds normal. No respiratory distress. She exhibits no tenderness.  Abdominal: Soft. Normal appearance and bowel sounds are normal. There is no hepatosplenomegaly. There is no tenderness. There is no rebound, no guarding, no tenderness at McBurney's point and negative Murphy's sign. No hernia.  Musculoskeletal: Normal range of motion.  Neurological: She is alert and oriented to person, place, and time. She has normal strength. No cranial nerve deficit or sensory deficit. Coordination normal. GCS eye subscore is 4. GCS verbal subscore is 5. GCS motor subscore is 6.  Skin: Skin is warm, dry and intact. No rash noted. No cyanosis.  Psychiatric: Her speech is normal and behavior is normal. Thought content normal. Her mood appears anxious.   Tearful  Nursing note and vitals reviewed.   ED Course  Procedures (including critical care time)  DIAGNOSTIC STUDIES: Oxygen Saturation is 97% on RA, Normal by my interpretation.    COORDINATION OF CARE: 2:57 AM- Will order blood work, EKG, and CXR. Will give Dilaudid, Zofran, and GI cocktail. Discussed treatment plan with pt at bedside and pt agreed to plan.     Labs Review Labs Reviewed  BASIC METABOLIC PANEL  CBC  I-STAT TROPOININ, ED    Imaging Review Dg Chest 2 View  08/28/2015  CLINICAL DATA:  40 year old female with sudden onset of chest pain and of breath EXAM: CHEST  2 VIEW COMPARISON:  Chest radiograph dated 04/08/2015 FINDINGS: The heart size and mediastinal contours are within normal limits. Both lungs are clear. The visualized skeletal structures are unremarkable. IMPRESSION: No active cardiopulmonary disease. Electronically Signed   By: Elgie Collard M.D.   On: 08/28/2015 01:25   I have personally reviewed and evaluated these images and lab results as part of my medical decision-making.   EKG Interpretation   Date/Time:  Wednesday August 28 2015 00:29:40 EDT Ventricular Rate:  93 PR Interval:  129 QRS Duration: 82 QT Interval:  349 QTC Calculation: 434 R Axis:   31 Text Interpretation:  Sinus rhythm Baseline wander in lead(s) V1 V4 V5 No  significant change since last tracing Confirmed by POLLINA  MD,  CHRISTOPHER ([id number]) on 08/28/2015 12:47:34 AM      MDM   Final diagnoses:  None  chest pain  Presents to the ER for evaluation of chest pain. Patient reports intermittent sharp stabbing pain in the center of her chest.She does not have any cardiac history. Symptoms do not seem consistent with cardiac etiology. She does have a history of esophagitis. She had some improvement with GI cocktail arrival. Lab work did reveal a slightly elevated lipase. This is nonspecific, but CT scan performed to rule out gallbladder disease, choledocholithiasis, pancreas  lesion, etc. CT scan was unremarkable. Patient likely  experiencing esophageal spasm.She was prescribed Ativan for this. She is out of the Valium that she normally takes, will not take both.  I personally performed the services described in this documentation, which was scribed in my presence. The recorded information has been reviewed and is accurate.   Gilda Creasehristopher J Pollina, MD 08/28/15 [id number]  Gilda Creasehristopher J Pollina, MD 08/28/15 0800

## 2015-08-28 NOTE — ED Notes (Signed)
This writer had and unsuccessful lab sticks x 2.

## 2015-08-29 ENCOUNTER — Ambulatory Visit (INDEPENDENT_AMBULATORY_CARE_PROVIDER_SITE_OTHER): Payer: BLUE CROSS/BLUE SHIELD | Admitting: Physician Assistant

## 2015-08-29 ENCOUNTER — Other Ambulatory Visit (INDEPENDENT_AMBULATORY_CARE_PROVIDER_SITE_OTHER): Payer: BLUE CROSS/BLUE SHIELD

## 2015-08-29 ENCOUNTER — Encounter: Payer: Self-pay | Admitting: Physician Assistant

## 2015-08-29 VITALS — BP 132/86 | HR 80 | Ht 69.75 in | Wt 258.4 lb

## 2015-08-29 DIAGNOSIS — K209 Esophagitis, unspecified without bleeding: Secondary | ICD-10-CM

## 2015-08-29 DIAGNOSIS — R0789 Other chest pain: Secondary | ICD-10-CM

## 2015-08-29 LAB — CBC WITH DIFFERENTIAL/PLATELET
Basophils Absolute: 0 10*3/uL (ref 0.0–0.1)
Basophils Relative: 0.4 % (ref 0.0–3.0)
Eosinophils Absolute: 0.1 10*3/uL (ref 0.0–0.7)
Eosinophils Relative: 1.3 % (ref 0.0–5.0)
HCT: 45.6 % (ref 36.0–46.0)
Hemoglobin: 15.6 g/dL — ABNORMAL HIGH (ref 12.0–15.0)
Lymphocytes Relative: 39.3 % (ref 12.0–46.0)
Lymphs Abs: 3.1 10*3/uL (ref 0.7–4.0)
MCHC: 34.3 g/dL (ref 30.0–36.0)
MCV: 84.4 fl (ref 78.0–100.0)
Monocytes Absolute: 0.4 10*3/uL (ref 0.1–1.0)
Monocytes Relative: 4.6 % (ref 3.0–12.0)
Neutro Abs: 4.3 10*3/uL (ref 1.4–7.7)
Neutrophils Relative %: 54.4 % (ref 43.0–77.0)
Platelets: 372 10*3/uL (ref 150.0–400.0)
RBC: 5.4 Mil/uL — ABNORMAL HIGH (ref 3.87–5.11)
RDW: 13.5 % (ref 11.5–15.5)
WBC: 7.9 10*3/uL (ref 4.0–10.5)

## 2015-08-29 LAB — HEPATIC FUNCTION PANEL
ALT: 23 U/L (ref 0–35)
AST: 18 U/L (ref 0–37)
Albumin: 4.4 g/dL (ref 3.5–5.2)
Alkaline Phosphatase: 63 U/L (ref 39–117)
Bilirubin, Direct: 0 mg/dL (ref 0.0–0.3)
Total Bilirubin: 0.5 mg/dL (ref 0.2–1.2)
Total Protein: 7.7 g/dL (ref 6.0–8.3)

## 2015-08-29 LAB — LIPASE: Lipase: 16 U/L (ref 11.0–59.0)

## 2015-08-29 MED ORDER — SUCRALFATE 1 GM/10ML PO SUSP: 1.0000 g | Freq: Three times a day (TID) | ORAL | Status: DC

## 2015-08-29 MED ORDER — HYOSCYAMINE SULFATE 0.125 MG SL SUBL: SUBLINGUAL_TABLET | SUBLINGUAL | Status: DC

## 2015-08-29 NOTE — Patient Instructions (Addendum)
Please go to the basement level to have your labs drawn.   ncrease protonix 40 mg top twice daily. We faxed a prescription for the sulcrafate liquid. Take 1 gram between meals and at bedtime. We also sent one for Levsin SL tablets to dissolve under the tongue  Every 4-6 hours.   Drink a full liquid and a very soft bland diet.   You have been scheduled for an endoscopy and flexible sigmoidoscopy. Please follow the written instructions given to you at your visit today. If you use inhalers (even only as needed), please bring them with you on the day of your procedure. Your physician has requested that you go to www.startemmi.com and enter the access code given to you at your visit today. This web site gives a general overview about your procedure. However, you should still follow specific instructions given to you by our office regarding your preparation for the procedure.  You have been [facility] abdominal [facility]1st floor of hospital) on Tuesday 09-03-2015 at 9:30 am . Please arrive at 9:15 minutes prior to your appointment for registration. Make certain not to have anything to eat or drink after midnight . Should you need to reschedule your appointment, please contact radiology at [phone]. This test typically takes about 30 minutes to perform.            If you are age 41 or younger, your body mass index should be between 19-25. Your Body mass index is 37.32 kg/(m^2). If this is out of the aformentioned range listed, please consider follow up with your Primary Care Provider.

## 2015-08-29 NOTE — Progress Notes (Signed)
Patient ID: [name], female   DOB: 04/09/1976, 40 y.o.   MRN: [id number]   Subjective:    Patient ID: [name], female    DOB: 10/03/1975, 40 y.o.   MRN: [id number]  HPI  [name] is a very nice 40 year old white female previous patient of Dr. Arlyce DiceKaplan, seen by myself in 2015 with an episode of acute esophagitis. She comes in today as a work in after an ER visit 2 nights ago with severe chest pain. Patient says she underwent a lumbar fusion about 4 months ago and has been undergoing physical therapy. She has been using Percocet on the days that she has physical therapy. She is on proton X 40 mg daily for acid reflux the time of her surgery was taking the medication twice daily but over the past few months realized she had only been taking it once a day. She says she was feeling fine on Tuesday went to physical therapy, came home a dinner and took a couple of Percocet for her back pain and about 45 minutes later developed a stabbing pain between her breasts which was constant and unrelenting.'s had some radiation into her back. She had belching and burping which did not relieve her symptoms and some nausea but no vomiting. She took an extra proton X then wound up taking about 6 Tums without any relief. She remembered she had some Carafate left over and took some Carafate liquid and then about 2 hours later when she had no relief she went to the emergency room. She was given a GI cocktail which did help but did not resolve her symptoms and the pain finally subsided after she had some IV pain medication.  Workup in the emergency room with CT of the abdomen and pelvis was unremarkable labs showed a WBC of 12.7 hemoglobin 14.8 LFTs normal with the exception of an AST of 102 and lipase was mildly elevated at 65. Since then she's been taking Protonix twice daily and says she's about 50% better but has been afraid to eat. She says she feels full and uncomfortable in her chest and has a tight feeling at the  end of her esophagus. She's not having any abdominal pain. She feels as if her food "doesn't want to go down". Had not been on any aspirin or NSAIDs no recent antibiotics.  Review of Systems Pertinent positive and negative review of systems were noted in the above HPI section.  All other review of systems was otherwise negative.  Outpatient Encounter Prescriptions as of 08/29/2015  Medication Sig  . ALPRAZolam (XANAX) 0.5 MG tablet Take 1 tablet (0.5 mg total) by mouth 3 (three) times daily as needed for anxiety.  . BD INTEGRA SYRINGE 25G X 1" 3 ML MISC USE AS DIRECTED  . calcium carbonate (TUMS - DOSED IN MG ELEMENTAL CALCIUM) 500 MG chewable tablet Chew 1 tablet by mouth 2 (two) times daily as needed for indigestion or heartburn.  . Cholecalciferol (VITAMIN D3) 2000 UNITS TABS Take 2,000 Units by mouth daily.   . cyanocobalamin (,VITAMIN B-12,) 1000 MCG/ML injection INJECT 1 ML (1,000 MCG TOTAL) INTO THE MUSCLE EVERY 7 DAYS.  Marland Kitchen. diazepam (VALIUM) 5 MG tablet Take 5-10 mg by mouth every 6 (six) hours as needed for muscle spasms.   . fluticasone (FLONASE) 50 MCG/ACT nasal spray Place 2 sprays into both nostrils daily as needed for allergies.   Marland Kitchen. gabapentin (NEURONTIN) 100 MG capsule Take 100 mg by mouth 3 (three) times daily as needed.   .Marland Kitchen  gabapentin (NEURONTIN) 300 MG capsule TAKE ONE CAPSULE BY MOUTH 3 TIMES A DAY  . hydrochlorothiazide (HYDRODIURIL) 25 MG tablet TAKE 1 TABLET BY MOUTH EVERY DAY (Patient taking differently: TAKE 25 MG BY MOUTH EVERY DAY)  . LORazepam (ATIVAN) 1 MG tablet Take 1 tablet (1 mg total) by mouth every 8 (eight) hours as needed (esophageal spasm pain).  . Multiple Vitamins-Minerals (MULTI-VITAMIN GUMMIES PO) Take 2 tablets by mouth daily.   . ondansetron (ZOFRAN ODT) 4 MG disintegrating tablet Take 1 tablet (4 mg total) by mouth every 8 (eight) hours as needed for nausea or vomiting.  Marland Kitchen oxyCODONE-acetaminophen (PERCOCET) 10-325 MG per tablet Take 1 tablet by mouth every  6 (six) hours as needed for pain.  Marland Kitchen oxyCODONE-acetaminophen (PERCOCET) 5-325 MG tablet Take 1-2 tablets by mouth every 4 (four) hours as needed.  . pantoprazole (PROTONIX) 40 MG tablet TAKE 1 TABLET BY MOUTH TWICE A DAY (Patient taking differently: TAKE 40 MG BY MOUTH TWICE A DAY)  . promethazine (PHENERGAN) 25 MG tablet TAKE 1 TABLET BY MOUTH EVERY 6 HOURS AS NEEDED FOR NAUSEA (Patient taking differently: TAKE 25 MG BY MOUTH EVERY 6 HOURS AS NEEDED FOR NAUSEA)  . simethicone (MYLICON) 80 MG chewable tablet Chew 160 mg by mouth every 6 (six) hours as needed (gas pain).  . sucralfate (CARAFATE) 1 GM/10ML suspension Take 10 mLs (1 g total) by mouth 4 (four) times daily -  with meals and at bedtime.  . SUMAtriptan (IMITREX) 50 MG tablet TAKE 1 TABLET BY MOUTH AT ONSET OF HEADACHE MAY REPEAT ONCE IN 2 HOURS IF PERSISTS OR RECURS (Patient taking differently: TAKE 150 MG BY MOUTH AT ONSET OF HEADACHE MAY REPEAT ONCE IN 2 HOURS IF PERSISTS OR RECURS)  . [DISCONTINUED] sucralfate (CARAFATE) 1 GM/10ML suspension Take 10 mLs (1 g total) by mouth 4 (four) times daily -  with meals and at bedtime.  . hyoscyamine (LEVSIN/SL) 0.125 MG SL tablet Take 1 tab under the tongue every 4-6 hours.  . [DISCONTINUED] ciprofloxacin (CIPRO) 500 MG tablet TAKE 1 TABLET BY MOUTH TWICE A DAY (Patient not taking: Reported on 08/28/2015)  . [DISCONTINUED] sucralfate (CARAFATE) 1 GM/10ML suspension Take 10 mLs (1 g total) by mouth 4 (four) times daily -  with meals and at bedtime. (Patient taking differently: Take 1 g by mouth 3 (three) times daily as needed (stomach pain). )   No facility-administered encounter medications on file as of 08/29/2015.   Allergies  Allergen Reactions  . Gadolinium Derivatives Swelling and Other (See Comments)    Arm swelling, tingling, & redness. Radiologist Dr. Grace Isaac recommends an injection of steroids if patient needs a gadolinium based contrast in the future.   Patient Active Problem List    Diagnosis Date Noted  . Spinal stenosis of lumbar region 04/01/2015  . Chronic migraine without aura without status migrainosus, not intractable 03/04/2015  . Elevated plasma metanephrines 11/16/2014  . Alopecia 05/21/2014  . Chronic tachycardia 04/04/2014  . Sciatica 01/11/2014  . Back pain 01/11/2014  . Intervertebral disc protrusion 01/11/2014  . HTN (hypertension) 01/04/2013  . Hemorrhoids 04/25/2012  . Urinary retention 12/11/2010  . CERUMEN IMPACTION 01/28/2010  . DEPRESSION 04/08/2009  . FIBROMYALGIA 10/11/2008  . Morbid obesity (HCC) 08/10/2008  . ACNE VULGARIS 08/10/2008  . INSOMNIA 06/20/2008  . VITAMIN B12 DEFICIENCY 03/23/2008  . PERIPHERAL NEUROPATHY 02/27/2008  . CONSTIPATION 08/22/2007  . SINUSITIS, ACUTE FRONTAL 02/07/2007  . ANXIETY DISORDER, GENERALIZED 01/04/2007  . GERD 01/04/2007  . DEGENERATIVE JOINT DISEASE,  KNEES, BILATERAL 01/04/2007  . RENAL CALCULUS, HX OF 01/04/2007   Social History   Social History  . Marital Status: Married    Spouse Name: N/A  . Number of Children: 2  . Years of Education: N/A   Occupational History  . housewife    Social History Main Topics  . Smoking status: Former Smoker    Types: Cigarettes    Quit date: 06/01/1998  . Smokeless tobacco: Never Used  . Alcohol Use: No  . Drug Use: No  . Sexual Activity: Yes    Birth Control/ Protection: Surgical   Other Topics Concern  . Not on file   Social History Narrative    Ms. Steenbergen's family history includes Cirrhosis in her mother; Diabetes in her father and mother; Fibromyalgia in her mother; Heart attack in her father; Heart disease in her father; Hypertension in her father; Liver disease in her mother; Neuropathy in her mother; Prostate cancer in her father; Pulmonary embolism in her mother; Thyroid cancer in her mother. There is no history of Colon cancer or Other.      Objective:    Filed Vitals:   08/29/15 1528  BP: 132/86  Pulse: 80    Physical Exam   well-developed white female in no acute distress, pleasant blood pressure 132/86 pulse 80 height 5 foot 9 weight 258, BMI 37.2. HEENT ;nontraumatic normocephalic EOMI PERRLA sclera anicteric, Cardiovascular; regular rate and rhythm with S1-S2 no murmur or gallop, Pulmonary; clear bilaterally no significant chest wall tenderness, Abdomen; soft basically nontender nondistended no palpable mass or hepatosplenomegaly bowel sounds are present, Rectal ;exam not done, Neuropsych; mood and affect appropriate     Assessment & Plan:   #1 40 yo female With acute severe prolonged episode of subxiphoid chest pain onset about 48 hours ago. ER workup negative for cardiac etiology, CT of the abdomen and pelvis unremarkable. Interesting that lipase is mildly elevated and AST mildly elevated as was WBC. I history her symptoms are more consistent with an acute esophagitis question pill esophagitis or possibly esophageal spasm ( DES or nutcracker esophagus). Need to rule out biliary etiology though negative CT  #2 prior history of esophagitis 2015 and history of chronic GERD #3 obesity #4 anxiety disorder #5 history of uretero lithiasis  #6 fibromyalgia #7 status post lumbar fusion 2016 October  Plan; Will repeat CBC, hepatic panel and lipase today Continue Protonix 40 mg by mouth twice a day  add Carafate suspension 1 g between meals and at bedtime 3 weeks Trial of Levsin sublingual every 4-6 hours when necessary for worsening episodes of pain/spasm We'll schedule for upper abdominal ultrasound Have scheduled for EGD with Dr. Adela Lank. Procedure discussed in detail with patient and she is agreeable to proceed. Depending on results of above, patient may ultimately require manometry.   Amy Oswald Hillock PA-C 08/29/2015   Cc: [name], MD

## 2015-08-30 ENCOUNTER — Telehealth: Payer: Self-pay | Admitting: Physician Assistant

## 2015-08-30 NOTE — Progress Notes (Signed)
Agree with assessment and plan as outlined.  

## 2015-08-30 NOTE — Telephone Encounter (Signed)
Called the patient and LM for her that [facility] and called in the Sulcrafate tablets, Take 1 tab with meals and at bedtime.  # 120 with 2 refills. [facility] and called in the prescription .  Also Lm for the patient to crush 1 tablet on a spoon and add water to it to make a slurry and she can swallow it that way.

## 2015-09-03 ENCOUNTER — Other Ambulatory Visit: Payer: Self-pay

## 2015-09-03 ENCOUNTER — Ambulatory Visit: Payer: BLUE CROSS/BLUE SHIELD | Admitting: Neurology

## 2015-09-03 ENCOUNTER — Ambulatory Visit (HOSPITAL_COMMUNITY)
Admission: RE | Admit: 2015-09-03 | Discharge: 2015-09-03 | Disposition: A | Discharge status: Home / Self Care | Payer: BLUE CROSS/BLUE SHIELD | Source: Ambulatory Visit | Attending: Physician Assistant | Admitting: Physician Assistant

## 2015-09-03 DIAGNOSIS — K8020 Calculus of gallbladder without cholecystitis without obstruction: Secondary | ICD-10-CM | POA: Diagnosis not present

## 2015-09-03 DIAGNOSIS — R932 Abnormal findings on diagnostic imaging of liver and biliary tract: Secondary | ICD-10-CM | POA: Insufficient documentation

## 2015-09-03 DIAGNOSIS — R1010 Upper abdominal pain, unspecified: Secondary | ICD-10-CM

## 2015-09-03 DIAGNOSIS — M5137 Other intervertebral disc degeneration, lumbosacral region: Secondary | ICD-10-CM | POA: Diagnosis not present

## 2015-09-03 DIAGNOSIS — K209 Esophagitis, unspecified without bleeding: Secondary | ICD-10-CM

## 2015-09-03 DIAGNOSIS — R0789 Other chest pain: Secondary | ICD-10-CM

## 2015-09-04 ENCOUNTER — Ambulatory Visit (AMBULATORY_SURGERY_CENTER): Payer: BLUE CROSS/BLUE SHIELD | Admitting: Gastroenterology

## 2015-09-04 ENCOUNTER — Encounter: Payer: Self-pay | Admitting: Gastroenterology

## 2015-09-04 VITALS — BP 118/84 | HR 74 | Temp 97.8°F | Resp 14 | Ht 69.75 in | Wt 258.0 lb

## 2015-09-04 DIAGNOSIS — K209 Esophagitis, unspecified: Secondary | ICD-10-CM | POA: Diagnosis not present

## 2015-09-04 DIAGNOSIS — R079 Chest pain, unspecified: Secondary | ICD-10-CM

## 2015-09-04 DIAGNOSIS — K317 Polyp of stomach and duodenum: Secondary | ICD-10-CM | POA: Diagnosis not present

## 2015-09-04 DIAGNOSIS — K2950 Unspecified chronic gastritis without bleeding: Secondary | ICD-10-CM | POA: Diagnosis not present

## 2015-09-04 MED ORDER — SODIUM CHLORIDE 0.9 % IV SOLN: 500.0000 mL | INTRAVENOUS | Status: DC

## 2015-09-04 NOTE — Progress Notes (Signed)
Called to room to assist during endoscopic procedure.  Patient ID and intended procedure confirmed with present staff. Received instructions for my participation in the procedure from the performing physician.  

## 2015-09-04 NOTE — Progress Notes (Signed)
To pacu vss patent aw report to rn 

## 2015-09-04 NOTE — Progress Notes (Signed)
No egg or soy allergy known to patient  No issues with past sedation with any surgeries  or procedures, no intubation problems  No diet pills per patient No home 02 use per patient  No blood thinners per patient  Pt denies issues with constipation   

## 2015-09-04 NOTE — Op Note (Signed)
[facility] Patient Name: [name] Procedure Date: 09/04/2015 10:13 AM MRN: [id number] Endoscopist: Viviann SpareSteven P. Adela LankArmbruster , MD Age: 40 Referring MD:  Date of Birth: 02/23/1976 Gender: Female Procedure:                Upper GI endoscopy Indications:              Epigastric abdominal pain, substernal discomfort,                            history of esophagitis Medicines:                Monitored Anesthesia Care Procedure:                Pre-Anesthesia Assessment:                           - Prior to the procedure, a History and Physical                            was performed, and patient medications and                            allergies were reviewed. The patient's tolerance of                            previous anesthesia was also reviewed. The risks                            and benefits of the procedure and the sedation                            options and risks were discussed with the patient.                            All questions were answered, and informed consent                            was obtained. Prior Anticoagulants: The patient has                            taken no previous anticoagulant or antiplatelet                            agents. ASA Grade Assessment: III - A patient with                            severe systemic disease. After reviewing the risks                            and benefits, the patient was deemed in                            satisfactory condition to undergo the procedure.  After obtaining informed consent, the endoscope was                            passed under direct vision. Throughout the                            procedure, the patient's blood pressure, pulse, and                            oxygen saturations were monitored continuously. The                            Model GIF-HQ190 [id number] scope was introduced                            through the mouth, and advanced to the second  part                            of duodenum. The upper GI endoscopy was                            accomplished without difficulty. The patient                            tolerated the procedure well. Scope In: Scope Out: Findings:      Esophagogastric landmarks were identified: the Z-line was found at 37       cm, the gastroesophageal junction was found at 37 cm and the site of       hiatal narrowing was found at 37 cm from the incisors.      The exam of the esophagus was otherwise normal. No evidence of       esophagitis or inflammation      Patchy mild inflammation characterized by erythema was found in the       gastric body and in the gastric antrum. No ulcerations or erosions.       Biopsies were taken with a cold forceps for histology and to rule out H       pylori.      A few 2 to 4 mm sessile polyps were found in the gastric fundus and in       the gastric body. One polyp was removed with a cold biopsy forceps as a       representative sample. Resection and retrieval were complete.      The exam of the stomach was otherwise normal.      The duodenal bulb and second portion of the duodenum were normal. Complications:            No immediate complications. Estimated blood loss:                            Minimal. Estimated Blood Loss:     Estimated blood loss was minimal. Impression:               - Esophagogastric landmarks identified.                           -  Gastritis. Biopsied.                           - A few gastric polyps. Resected and retrieved.                           - Normal duodenal bulb and second portion of the                            duodenum. Recommendation:           - Patient has a contact number available for                            emergencies. The signs and symptoms of potential                            delayed complications were discussed with the                            patient. Return to normal activities tomorrow.                             Written discharge instructions were provided to the                            patient.                           - Resume previous diet.                           - Continue present medications.                           - No aspirin, ibuprofen, naproxen, or other                            non-steroidal anti-inflammatory drugs.                           - Await pathology results.                           - No repeat upper endoscopy.                           - Consider general surgery consultation for                            possible cholecystectomy given US findings,                            symptoms could have been biliary colic but will                            await pathology results. Viviann Spare P. Locke Barrell, MD 09/04/2015 10:39:36  AM This report has been signed electronically. Number of Addenda: 0 Referring MD:      Gailen Shelter, MD

## 2015-09-04 NOTE — Patient Instructions (Signed)
YOU HAD AN ENDOSCOPIC PROCEDURE TODAY AT THE [facility]:   Refer to the procedure report that was given to you for any specific questions about what was found during the examination.  If the procedure report does not answer your questions, please call your gastroenterologist to clarify.  If you requested that your care partner not be given the details of your procedure findings, then the procedure report has been included in a sealed envelope for you to review at your convenience later.  YOU SHOULD EXPECT: Some feelings of bloating in the abdomen. Passage of more gas than usual.  Walking can help get rid of the air that was put into your GI tract during the procedure and reduce the bloating. If you had a lower endoscopy (such as a colonoscopy or flexible sigmoidoscopy) you may notice spotting of blood in your stool or on the toilet paper. If you underwent a bowel prep for your procedure, you may not have a normal bowel movement for a few days.  Please Note:  You might notice some irritation and congestion in your nose or some drainage.  This is from the oxygen used during your procedure.  There is no need for concern and it should clear up in a day or so.  SYMPTOMS TO REPORT IMMEDIATELY:     Following upper endoscopy (EGD)  Vomiting of blood or coffee ground material  New chest pain or pain under the shoulder blades  Painful or persistently difficult swallowing  New shortness of breath  Fever of 100F or higher  Black, tarry-looking stools  For urgent or emergent issues, a gastroenterologist can be reached at any hour by calling [phone].   DIET: Your first meal following the procedure should be a small meal and then it is ok to progress to your normal diet. Heavy or fried foods are harder to digest and may make you feel nauseous or bloated.  Likewise, meals heavy in dairy and vegetables can increase bloating.  Drink plenty of fluids but you should avoid alcoholic beverages  for 24 hours.  ACTIVITY:  You should plan to take it easy for the rest of today and you should NOT DRIVE or use heavy machinery until tomorrow (because of the sedation medicines used during the test).    FOLLOW UP: Our staff will call the number listed on your records the next business day following your procedure to check on you and address any questions or concerns that you may have regarding the information given to you following your procedure. If we do not reach you, we will leave a message.  However, if you are feeling well and you are not experiencing any problems, there is no need to return our call.  We will assume that you have returned to your regular daily activities without incident.  If any biopsies were taken you will be contacted by phone or by letter within the next 1-3 weeks.  Please call us at [phone] if you have not heard about the biopsies in 3 weeks.    SIGNATURES/CONFIDENTIALITY: You and/or your care partner have signed paperwork which will be entered into your electronic medical record.  These signatures attest to the fact that that the information above on your After Visit Summary has been reviewed and is understood.  Full responsibility of the confidentiality of this discharge information lies with you and/or your care-partner.   No aspirin or ant inflammatory products   Continue other usual medications  Consider general surgery consult  for possible gall bladder removal  Await biopsy results per letter from Dr. Adela LankArmbruster  Information on gastritis given to you today

## 2015-09-05 ENCOUNTER — Telehealth: Payer: Self-pay

## 2015-09-05 NOTE — Telephone Encounter (Signed)
  Follow up Call-  Call back number 09/04/2015  Post procedure Call Back phone  # [phone]  Permission to leave phone message Yes    Patient was called for follow up after her procedure on 09/04/2015. No answer at the number given for follow up phone call. A message was left on the answering machine.

## 2015-09-09 ENCOUNTER — Ambulatory Visit: Payer: Self-pay | Admitting: General Surgery

## 2015-09-09 DIAGNOSIS — K8020 Calculus of gallbladder without cholecystitis without obstruction: Secondary | ICD-10-CM | POA: Diagnosis not present

## 2015-09-12 ENCOUNTER — Telehealth: Payer: Self-pay | Admitting: Gastroenterology

## 2015-09-12 ENCOUNTER — Other Ambulatory Visit: Payer: Self-pay | Admitting: Family Medicine

## 2015-09-12 NOTE — Telephone Encounter (Signed)
Is she having a yeast infection?

## 2015-09-12 NOTE — Telephone Encounter (Signed)
I just sent you a result note with details to contact the patient with the results. Thanks

## 2015-09-12 NOTE — Telephone Encounter (Signed)
OK to refill one pill, with one refill

## 2015-09-12 NOTE — Telephone Encounter (Signed)
See results note. 

## 2015-09-12 NOTE — Telephone Encounter (Signed)
Patient is calling for path results. Please, advise. 

## 2015-09-12 NOTE — Telephone Encounter (Signed)
Yes she was in antibiotic last week and now shes have yeast

## 2015-09-12 NOTE — Telephone Encounter (Signed)
Ok to refill it. 

## 2015-09-16 ENCOUNTER — Ambulatory Visit: Payer: BLUE CROSS/BLUE SHIELD | Admitting: Neurology

## 2015-09-25 ENCOUNTER — Other Ambulatory Visit (HOSPITAL_COMMUNITY): Payer: Self-pay | Admitting: *Deleted

## 2015-09-25 ENCOUNTER — Encounter (HOSPITAL_COMMUNITY): Payer: Self-pay

## 2015-09-25 ENCOUNTER — Encounter (HOSPITAL_COMMUNITY)
Admission: RE | Admit: 2015-09-25 | Discharge: 2015-09-25 | Disposition: A | Discharge status: Home / Self Care | Payer: BLUE CROSS/BLUE SHIELD | Source: Ambulatory Visit | Attending: General Surgery | Admitting: General Surgery

## 2015-09-25 DIAGNOSIS — Z01818 Encounter for other preprocedural examination: Secondary | ICD-10-CM | POA: Diagnosis not present

## 2015-09-25 DIAGNOSIS — Z79899 Other long term (current) drug therapy: Secondary | ICD-10-CM | POA: Diagnosis not present

## 2015-09-25 DIAGNOSIS — E538 Deficiency of other specified B group vitamins: Secondary | ICD-10-CM | POA: Insufficient documentation

## 2015-09-25 DIAGNOSIS — K811 Chronic cholecystitis: Secondary | ICD-10-CM | POA: Insufficient documentation

## 2015-09-25 DIAGNOSIS — Z01812 Encounter for preprocedural laboratory examination: Secondary | ICD-10-CM | POA: Insufficient documentation

## 2015-09-25 DIAGNOSIS — E785 Hyperlipidemia, unspecified: Secondary | ICD-10-CM | POA: Insufficient documentation

## 2015-09-25 DIAGNOSIS — Z87891 Personal history of nicotine dependence: Secondary | ICD-10-CM | POA: Diagnosis not present

## 2015-09-25 DIAGNOSIS — K219 Gastro-esophageal reflux disease without esophagitis: Secondary | ICD-10-CM | POA: Insufficient documentation

## 2015-09-25 LAB — COMPREHENSIVE METABOLIC PANEL
ALT: 14 U/L (ref 14–54)
AST: 15 U/L (ref 15–41)
Albumin: 3.7 g/dL (ref 3.5–5.0)
Alkaline Phosphatase: 60 U/L (ref 38–126)
Anion gap: 12 (ref 5–15)
BUN: 12 mg/dL (ref 6–20)
CO2: 28 mmol/L (ref 22–32)
Calcium: 9 mg/dL (ref 8.9–10.3)
Chloride: 98 mmol/L — ABNORMAL LOW (ref 101–111)
Creatinine, Ser: 0.93 mg/dL (ref 0.44–1.00)
GFR calc Af Amer: >60 mL/min (ref >60)
GFR calc non Af Amer: >60 mL/min (ref >60)
Glucose, Bld: 134 mg/dL — ABNORMAL HIGH (ref 65–99)
Potassium: 3.5 mmol/L (ref 3.5–5.1)
Sodium: 138 mmol/L (ref 135–145)
Total Bilirubin: 0.3 mg/dL (ref 0.3–1.2)
Total Protein: 6.7 g/dL (ref 6.5–8.1)

## 2015-09-25 LAB — CBC WITH DIFFERENTIAL/PLATELET
Basophils Absolute: 0 10*3/uL (ref 0.0–0.1)
Basophils Relative: 0 %
Eosinophils Absolute: 0.1 10*3/uL (ref 0.0–0.7)
Eosinophils Relative: 2 %
HCT: 42.8 % (ref 36.0–46.0)
Hemoglobin: 14.5 g/dL (ref 12.0–15.0)
Lymphocytes Relative: 35 %
Lymphs Abs: 3 10*3/uL (ref 0.7–4.0)
MCH: 28.5 pg (ref 26.0–34.0)
MCHC: 33.9 g/dL (ref 30.0–36.0)
MCV: 84.1 fL (ref 78.0–100.0)
Monocytes Absolute: 0.5 10*3/uL (ref 0.1–1.0)
Monocytes Relative: 5 %
Neutro Abs: 5 10*3/uL (ref 1.7–7.7)
Neutrophils Relative %: 58 %
Platelets: 326 10*3/uL (ref 150–400)
RBC: 5.09 MIL/uL (ref 3.87–5.11)
RDW: 13.3 % (ref 11.5–15.5)
WBC: 8.6 10*3/uL (ref 4.0–10.5)

## 2015-09-25 NOTE — Progress Notes (Signed)
Cardiologist Dr Estrella MyrtleNishen.  She was seen by him due to racing heart beat. Stated that any cardiac symptoms were related to her Nortriptyline for her migraines and when she stopped these she no longer had any problems.  Also was ordered a sleep study by Dr Graciela HusbandsKlein (or Estrella MyrtleNishen?) for the same but does not have sleep apnea.   [facility]  [facility]ers[facility]

## 2015-09-25 NOTE — Pre-Procedure Instructions (Signed)
    [name]  09/25/2015      [id number] [place], [place] - [address] [place] [id number] Phone: [phone] Fax: [phone]    Your procedure is scheduled on Thursday May 4th  [facility] at 12:45 pm  Call this number if you have problems the morning of surgery: [phone]  If questions prior to surgical date call: [phone] between 8 & 4   Remember:  Do not eat food or drink liquids after midnight.  Take these medicines the morning of surgery with A SIP OF WATER: Xanax if needed, Valium if needed, Diflucan (fluconazole), gabapentin (neurontin), hyoscyamine (Levsin) if needed, Lorazepam (ativan) if needed, Ondansetron (Zofran) if needed, Oxycodone (percocet) if needed, Protonix (pantoprazole), phenergan (promethazine) if needed, imitrex (sumatriptan) if needed  Stop taking: all vitamin, mineral and herbal supplements. Aspirin or aspirin containing products, Advil, Aleve, Ibuprofen, Naproxen, Motrin.   Do not wear jewelry, make-up or nail polish.  Do not wear lotions, powders, or perfumes.  You may wear deodorant.  Do not shave 48 hours prior to surgery.   Do not bring valuables to the hospital.  [facility] is not responsible for any belongings or valuables.  Contacts, dentures or bridgework may not be worn into surgery.  Leave your suitcase in the car.  After surgery it may be brought to your room.  Patients discharged the day of surgery will not be allowed to drive home.   Special instructions:  Shower with CHG the night before and morning of surgery as instructed  Please read over the following fact sheets that you were given. Pain Booklet, Coughing and Deep Breathing and Surgical Site Infection Prevention

## 2015-09-26 NOTE — Progress Notes (Signed)
Anesthesia Chart Review:  Pt is a 40 year old female scheduled for laparoscopic cholecystectomy on 10/03/2015 with Dr. Sheliah HatchKinsinger.   PMH includes:  sinus tachycardia, hyperlipidemia, B12 deficiency, GERD. Former smoker. BMI 38. S/p PLIF 04/01/15. S/p hysterectomy.   Medications include: hctz, protonix.   Preoperative labs reviewed.   Chest x-ray 08/28/15 reviewed. No active cardiopulmonary disease.   EKG 08/28/15: Sinus rhythm. Baseline wander in lead(s) V1 V4 V5   Echo 08/29/2014:  - Left ventricle: The cavity size was normal. Wall thickness was normal. Systolic function was normal. The estimated ejection fraction was in the range of 60% to 65%. Wall motion was normal; there were no regional wall motion abnormalities. - Left atrium: The atrium was mildly dilated.  Pt was being followed by Dr. Sherryl MangesSteven Klein at the EP cardiology clinic for tachycardia. Treated with various CV meds until pt's nortriptyline (for migraines) was discontinued and pt's tachycardia resolved. No longer on any med to control HR. Last office visit with Uvaldo RisingA. Seiler, NP was 03/13/15, follow up recommended with EP clinic in 1 year. Pt also saw Dr. Everardo AllEllison with endocrinology for tachycardia, no adrenal problems identified.   If no changes, I anticipate pt can proceed with surgery as scheduled.   Rica Mastngela Braylee Lal, FNP-BC [facility]Anesthesiology Phone: [phone] 09/26/2015 3:56 PM

## 2015-10-03 ENCOUNTER — Ambulatory Visit (HOSPITAL_COMMUNITY): Payer: BLUE CROSS/BLUE SHIELD | Admitting: Anesthesiology

## 2015-10-03 ENCOUNTER — Encounter (HOSPITAL_COMMUNITY): Payer: Self-pay | Admitting: Surgery

## 2015-10-03 ENCOUNTER — Ambulatory Visit (HOSPITAL_COMMUNITY): Payer: BLUE CROSS/BLUE SHIELD | Admitting: Emergency Medicine

## 2015-10-03 ENCOUNTER — Encounter (HOSPITAL_COMMUNITY)
Admission: RE | Disposition: A | Discharge status: Home / Self Care | Payer: Self-pay | Source: Ambulatory Visit | Attending: General Surgery

## 2015-10-03 ENCOUNTER — Ambulatory Visit (HOSPITAL_COMMUNITY)
Admission: RE | Admit: 2015-10-03 | Discharge: 2015-10-03 | Disposition: A | Discharge status: Home / Self Care | Payer: BLUE CROSS/BLUE SHIELD | Source: Ambulatory Visit | Attending: General Surgery | Admitting: General Surgery

## 2015-10-03 DIAGNOSIS — I10 Essential (primary) hypertension: Secondary | ICD-10-CM | POA: Diagnosis not present

## 2015-10-03 DIAGNOSIS — K219 Gastro-esophageal reflux disease without esophagitis: Secondary | ICD-10-CM | POA: Diagnosis not present

## 2015-10-03 DIAGNOSIS — K8020 Calculus of gallbladder without cholecystitis without obstruction: Secondary | ICD-10-CM | POA: Diagnosis present

## 2015-10-03 DIAGNOSIS — Z87891 Personal history of nicotine dependence: Secondary | ICD-10-CM | POA: Diagnosis not present

## 2015-10-03 DIAGNOSIS — L918 Other hypertrophic disorders of the skin: Secondary | ICD-10-CM | POA: Insufficient documentation

## 2015-10-03 DIAGNOSIS — M797 Fibromyalgia: Secondary | ICD-10-CM | POA: Diagnosis not present

## 2015-10-03 DIAGNOSIS — E785 Hyperlipidemia, unspecified: Secondary | ICD-10-CM | POA: Insufficient documentation

## 2015-10-03 DIAGNOSIS — K811 Chronic cholecystitis: Secondary | ICD-10-CM | POA: Diagnosis not present

## 2015-10-03 HISTORY — PX: CHOLECYSTECTOMY: SHX55

## 2015-10-03 HISTORY — PX: EXCISION OF SKIN TAG: SHX6270

## 2015-10-03 SURGERY — LAPAROSCOPIC CHOLECYSTECTOMY
Anesthesia: General | Site: Abdomen

## 2015-10-03 MED ORDER — ACETAMINOPHEN 10 MG/ML IV SOLN
INTRAVENOUS | Status: AC
  Administered 2015-10-03: 1000 mg
  Filled 2015-10-03: qty 100

## 2015-10-03 MED ORDER — HEPARIN SODIUM (PORCINE) 5000 UNIT/ML IJ SOLN
5000.0000 [IU] | Freq: Once | INTRAMUSCULAR | Status: AC
  Administered 2015-10-03: 5000 [IU] via SUBCUTANEOUS

## 2015-10-03 MED ORDER — LIDOCAINE 2% (20 MG/ML) 5 ML SYRINGE
INTRAMUSCULAR | Status: AC
  Filled 2015-10-03: qty 5

## 2015-10-03 MED ORDER — PROPOFOL 10 MG/ML IV BOLUS
INTRAVENOUS | Status: AC
Start: 2015-10-03 — End: 2015-10-03
  Filled 2015-10-03: qty 20

## 2015-10-03 MED ORDER — BUPIVACAINE-EPINEPHRINE 0.25% -1:200000 IJ SOLN
INTRAMUSCULAR | Status: DC | PRN
  Administered 2015-10-03: 20 mL

## 2015-10-03 MED ORDER — ONDANSETRON HCL 4 MG/2ML IJ SOLN
INTRAMUSCULAR | Status: DC | PRN
  Administered 2015-10-03: 4 mg via INTRAVENOUS

## 2015-10-03 MED ORDER — MEPERIDINE HCL 25 MG/ML IJ SOLN: 6.2500 mg | INTRAMUSCULAR | Status: DC | PRN

## 2015-10-03 MED ORDER — OXYCODONE HCL 5 MG PO TABS
5.0000 mg | ORAL_TABLET | Freq: Once | ORAL | Status: AC
  Administered 2015-10-03: 5 mg via ORAL

## 2015-10-03 MED ORDER — SUCCINYLCHOLINE CHLORIDE 20 MG/ML IJ SOLN
INTRAMUSCULAR | Status: DC | PRN
  Administered 2015-10-03: 160 mg via INTRAVENOUS

## 2015-10-03 MED ORDER — HYDROMORPHONE HCL 1 MG/ML IJ SOLN
INTRAMUSCULAR | Status: AC
  Administered 2015-10-03: 0.5 mg via INTRAVENOUS
  Filled 2015-10-03: qty 1

## 2015-10-03 MED ORDER — OXYCODONE HCL 5 MG PO TABS
ORAL_TABLET | ORAL | Status: AC
  Administered 2015-10-03: 10 mg
  Filled 2015-10-03: qty 2

## 2015-10-03 MED ORDER — FENTANYL CITRATE (PF) 100 MCG/2ML IJ SOLN
INTRAMUSCULAR | Status: DC | PRN
  Administered 2015-10-03: 100 ug via INTRAVENOUS
  Administered 2015-10-03 (×3): 50 ug via INTRAVENOUS
  Administered 2015-10-03 (×2): 100 ug via INTRAVENOUS

## 2015-10-03 MED ORDER — SUGAMMADEX SODIUM 500 MG/5ML IV SOLN
INTRAVENOUS | Status: AC
  Filled 2015-10-03: qty 5

## 2015-10-03 MED ORDER — CEFOTETAN DISODIUM-DEXTROSE 2-2.08 GM-% IV SOLR
2.0000 g | INTRAVENOUS | Status: AC
  Administered 2015-10-03: 2 g via INTRAVENOUS

## 2015-10-03 MED ORDER — LACTATED RINGERS IV SOLN
INTRAVENOUS | Status: DC | PRN
  Administered 2015-10-03 (×3): via INTRAVENOUS

## 2015-10-03 MED ORDER — DEXAMETHASONE SODIUM PHOSPHATE 4 MG/ML IJ SOLN
INTRAMUSCULAR | Status: DC | PRN
  Administered 2015-10-03: 10 mg via INTRAVENOUS

## 2015-10-03 MED ORDER — MIDAZOLAM HCL 2 MG/2ML IJ SOLN
INTRAMUSCULAR | Status: AC
  Administered 2015-10-03: 1 mg
  Filled 2015-10-03: qty 2

## 2015-10-03 MED ORDER — LACTATED RINGERS IV SOLN: INTRAVENOUS | Status: DC

## 2015-10-03 MED ORDER — KETOROLAC TROMETHAMINE 30 MG/ML IJ SOLN
INTRAMUSCULAR | Status: AC
  Administered 2015-10-03: 30 mg via INTRAVENOUS
  Filled 2015-10-03: qty 1

## 2015-10-03 MED ORDER — PHENYLEPHRINE HCL 10 MG/ML IJ SOLN
INTRAMUSCULAR | Status: DC | PRN
  Administered 2015-10-03 (×2): 80 ug via INTRAVENOUS

## 2015-10-03 MED ORDER — ROCURONIUM BROMIDE 50 MG/5ML IV SOLN
INTRAVENOUS | Status: AC
  Filled 2015-10-03: qty 1

## 2015-10-03 MED ORDER — SODIUM CHLORIDE 0.9 % IR SOLN
Status: DC | PRN
  Administered 2015-10-03: 1000 mL

## 2015-10-03 MED ORDER — LIDOCAINE HCL (CARDIAC) 20 MG/ML IV SOLN
INTRAVENOUS | Status: DC | PRN
  Administered 2015-10-03: 100 mg via INTRAVENOUS

## 2015-10-03 MED ORDER — OXYCODONE-ACETAMINOPHEN 5-325 MG PO TABS
1.0000 | ORAL_TABLET | Freq: Once | ORAL | Status: AC
  Administered 2015-10-03: 1 via ORAL

## 2015-10-03 MED ORDER — CEFOTETAN DISODIUM-DEXTROSE 2-2.08 GM-% IV SOLR
INTRAVENOUS | Status: DC
Start: 2015-10-03 — End: 2015-10-04
  Filled 2015-10-03: qty 50

## 2015-10-03 MED ORDER — HEPARIN SODIUM (PORCINE) 5000 UNIT/ML IJ SOLN
INTRAMUSCULAR | Status: AC
Start: 2015-10-03 — End: 2015-10-03
  Administered 2015-10-03: 5000 [IU] via SUBCUTANEOUS
  Filled 2015-10-03: qty 1

## 2015-10-03 MED ORDER — OXYCODONE HCL 10 MG PO TABS: 10.0000 mg | ORAL_TABLET | ORAL | Status: DC | PRN

## 2015-10-03 MED ORDER — LACTATED RINGERS IV SOLN
INTRAVENOUS | Status: DC
  Administered 2015-10-03: 14:00:00 via INTRAVENOUS

## 2015-10-03 MED ORDER — 0.9 % SODIUM CHLORIDE (POUR BTL) OPTIME
TOPICAL | Status: DC | PRN
  Administered 2015-10-03: 1000 mL

## 2015-10-03 MED ORDER — OXYCODONE-ACETAMINOPHEN 5-325 MG PO TABS
ORAL_TABLET | ORAL | Status: AC
  Administered 2015-10-03: 1 via ORAL
  Filled 2015-10-03: qty 1

## 2015-10-03 MED ORDER — PROPOFOL 10 MG/ML IV BOLUS
INTRAVENOUS | Status: DC | PRN
  Administered 2015-10-03: 200 mg via INTRAVENOUS

## 2015-10-03 MED ORDER — PROCHLORPERAZINE EDISYLATE 5 MG/ML IJ SOLN
10.0000 mg | INTRAMUSCULAR | Status: DC | PRN
  Administered 2015-10-03: 10 mg via INTRAVENOUS

## 2015-10-03 MED ORDER — HYDROMORPHONE HCL 1 MG/ML IJ SOLN
0.5000 mg | INTRAMUSCULAR | Status: DC | PRN
  Administered 2015-10-03: 0.25 mg via INTRAVENOUS
  Administered 2015-10-03: 0.5 mg via INTRAVENOUS

## 2015-10-03 MED ORDER — KETOROLAC TROMETHAMINE 30 MG/ML IJ SOLN
30.0000 mg | Freq: Once | INTRAMUSCULAR | Status: AC
  Administered 2015-10-03: 30 mg via INTRAVENOUS

## 2015-10-03 MED ORDER — OXYCODONE HCL 5 MG PO TABS
ORAL_TABLET | ORAL | Status: AC
  Administered 2015-10-03: 5 mg via ORAL
  Filled 2015-10-03: qty 1

## 2015-10-03 MED ORDER — ROCURONIUM BROMIDE 100 MG/10ML IV SOLN
INTRAVENOUS | Status: DC | PRN
  Administered 2015-10-03: 30 mg via INTRAVENOUS

## 2015-10-03 MED ORDER — SUGAMMADEX SODIUM 200 MG/2ML IV SOLN
INTRAVENOUS | Status: DC | PRN
  Administered 2015-10-03: 400 mg via INTRAVENOUS

## 2015-10-03 MED ORDER — FENTANYL CITRATE (PF) 250 MCG/5ML IJ SOLN
INTRAMUSCULAR | Status: AC
  Filled 2015-10-03: qty 5

## 2015-10-03 MED ORDER — MIDAZOLAM HCL 5 MG/5ML IJ SOLN
INTRAMUSCULAR | Status: DC | PRN
  Administered 2015-10-03: 2 mg via INTRAVENOUS

## 2015-10-03 MED ORDER — ONDANSETRON HCL 4 MG/2ML IJ SOLN
INTRAMUSCULAR | Status: AC
  Filled 2015-10-03: qty 2

## 2015-10-03 MED ORDER — MIDAZOLAM HCL 2 MG/2ML IJ SOLN
INTRAMUSCULAR | Status: AC
  Filled 2015-10-03: qty 2

## 2015-10-03 MED ORDER — PROCHLORPERAZINE EDISYLATE 5 MG/ML IJ SOLN
INTRAMUSCULAR | Status: AC
  Administered 2015-10-03: 10 mg via INTRAVENOUS
  Filled 2015-10-03: qty 2

## 2015-10-03 MED ORDER — HYDROMORPHONE HCL 1 MG/ML IJ SOLN
0.2500 mg | INTRAMUSCULAR | Status: DC | PRN
  Administered 2015-10-03 (×4): 0.5 mg via INTRAVENOUS

## 2015-10-03 SURGICAL SUPPLY — 34 items
BLADE SURG ROTATE 9660 (MISCELLANEOUS) IMPLANT
CANISTER SUCTION 2500CC (MISCELLANEOUS) ×3 IMPLANT
CHLORAPREP W/TINT 26ML (MISCELLANEOUS) ×3 IMPLANT
CLIP LIGATING HEMO LOK XL GOLD (MISCELLANEOUS) ×6 IMPLANT
COVER SURGICAL LIGHT HANDLE (MISCELLANEOUS) ×3 IMPLANT
ELECT REM PT RETURN 9FT ADLT (ELECTROSURGICAL) ×3
ELECTRODE REM PT RTRN 9FT ADLT (ELECTROSURGICAL) ×2 IMPLANT
GLOVE BIOGEL PI IND STRL 6.5 (GLOVE) ×4 IMPLANT
GLOVE BIOGEL PI IND STRL 7.0 (GLOVE) ×2 IMPLANT
GLOVE BIOGEL PI INDICATOR 6.5 (GLOVE) ×2
GLOVE BIOGEL PI INDICATOR 7.0 (GLOVE) ×1
GLOVE ECLIPSE 6.5 STRL STRAW (GLOVE) ×6 IMPLANT
GLOVE SURG SS PI 7.0 STRL IVOR (GLOVE) ×3 IMPLANT
GOWN STRL REUS W/ TWL LRG LVL3 (GOWN DISPOSABLE) ×6 IMPLANT
GOWN STRL REUS W/TWL LRG LVL3 (GOWN DISPOSABLE) ×3
GRASPER SUT TROCAR 14GX15 (MISCELLANEOUS) ×3 IMPLANT
KIT BASIN OR (CUSTOM PROCEDURE TRAY) ×3 IMPLANT
KIT ROOM TURNOVER OR (KITS) ×3 IMPLANT
LIQUID BAND (GAUZE/BANDAGES/DRESSINGS) ×3 IMPLANT
NS IRRIG 1000ML POUR BTL (IV SOLUTION) ×3 IMPLANT
PAD ARMBOARD 7.5X6 YLW CONV (MISCELLANEOUS) ×3 IMPLANT
POUCH RETRIEVAL ECOSAC 10 (ENDOMECHANICALS) ×2 IMPLANT
POUCH RETRIEVAL ECOSAC 10MM (ENDOMECHANICALS) ×1
SCISSORS LAP 5X35 DISP (ENDOMECHANICALS) ×3 IMPLANT
SET IRRIG TUBING LAPAROSCOPIC (IRRIGATION / IRRIGATOR) ×3 IMPLANT
SLEEVE ENDOPATH XCEL 5M (ENDOMECHANICALS) ×6 IMPLANT
SPECIMEN JAR SMALL (MISCELLANEOUS) ×3 IMPLANT
SUT MNCRL AB 4-0 PS2 18 (SUTURE) ×3 IMPLANT
TOWEL OR 17X24 6PK STRL BLUE (TOWEL DISPOSABLE) ×3 IMPLANT
TOWEL OR 17X26 10 PK STRL BLUE (TOWEL DISPOSABLE) ×3 IMPLANT
TRAY LAPAROSCOPIC MC (CUSTOM PROCEDURE TRAY) ×3 IMPLANT
TROCAR XCEL 12X100 BLDLESS (ENDOMECHANICALS) ×3 IMPLANT
TROCAR XCEL NON-BLD 5MMX100MML (ENDOMECHANICALS) ×3 IMPLANT
TUBING INSUFFLATION (TUBING) ×3 IMPLANT

## 2015-10-03 NOTE — Transfer of Care (Signed)
Immediate Anesthesia Transfer of Care Note  Patient: [name]  Procedure(s) Performed: Procedure(s): LAPAROSCOPIC CHOLECYSTECTOMY (N/A) EXCISION OF SKIN TAG  Patient Location: PACU  Anesthesia Type:General  Level of Consciousness: awake, alert  and oriented  Airway & Oxygen Therapy: Patient Spontanous Breathing and Patient connected to nasal cannula oxygen  Post-op Assessment: Report given to RN, Post -op Vital signs reviewed and stable and Patient moving all extremities X 4  Post vital signs: Reviewed and stable  Last Vitals:  Filed Vitals:   10/03/15 1315 10/03/15 1700  BP: 127/81 144/97  Pulse: 112 103  Temp: 37.1 C 36.9 C  Resp: 20 23    Last Pain:  Filed Vitals:   10/03/15 1713  PainSc: 10-Worst pain ever         Complications: No apparent anesthesia complications

## 2015-10-03 NOTE — Anesthesia Postprocedure Evaluation (Signed)
Anesthesia Post Note  Patient: [name]  Procedure(s) Performed: Procedure(s) (LRB): LAPAROSCOPIC CHOLECYSTECTOMY (N/A) EXCISION OF SKIN TAG  Patient location during evaluation: PACU Anesthesia Type: General Level of consciousness: awake, awake and alert and oriented Pain management: pain level controlled Vital Signs Assessment: post-procedure vital signs reviewed and stable Respiratory status: spontaneous breathing, nonlabored ventilation, respiratory function stable and patient connected to nasal cannula oxygen Cardiovascular status: blood pressure returned to baseline Anesthetic complications: no    Last Vitals:  Filed Vitals:   10/03/15 1745 10/03/15 1800  BP: 89/54 87/55  Pulse: 88 77  Temp:    Resp: 16 14    Last Pain:  Filed Vitals:   10/03/15 1806  PainSc: 8                  Catharine Kettlewell COKER

## 2015-10-03 NOTE — OR Nursing (Signed)
Pt. Stood with the assistance of two and pivoted to the recliner.

## 2015-10-03 NOTE — OR Nursing (Signed)
Pt ambulated around the pacu and taken to the bathroom.  Pt. Voided.  Pt. Placed back in chair.

## 2015-10-03 NOTE — H&P (Signed)
[name] is an 40 y.o. female.   Chief Complaint: abdominal pain HPI: 40 yo female with multiple episodes of epigastric pain radiating to the right shoulder. [name] has undergone endoscopy and multiple other tests and diagnosed with symptomatic gallstones.  Past Medical History  Diagnosis Date  . Allergy   . GERD (gastroesophageal reflux disease)   . Polycystic ovaries   . B12 deficiency   . Peripheral neuropathy (HCC)   . Fibromyalgia   . Migraine   . Anxiety   . Intervertebral disc protrusion 01/11/2014  . Dysrhythmia     hx tachy-was from medication nortriptylline  . Esophagitis     rx  . History of kidney stones   . Hyperlipidemia   . Depression     not currently     Past Surgical History  Procedure Laterality Date  . Appendectomy    . Vaginal hysterectomy    . Tonsillectomy    . Sinusotomy  12/14/06  . Wrist ganglion excision Left   . Ovarian cyst removal    . Oophorectomy      left overy  . Rectocele repair    . Bladder tack  2012  . Back surgery  2012    left laminectomy at L3-4 per Dr. Phoebe Perch   . Lumbar disc surgery  01-31-14    per Dr. [name]   . Spinal fusion  2016/10  . Tonsillectomy      Family History  Problem Relation Age of Onset  . Fibromyalgia Mother   . Diabetes Mother   . Thyroid cancer Mother   . Liver disease Mother   . Neuropathy Mother   . Cirrhosis Mother     non-alcoholic  . Pulmonary embolism Mother     both lungs  . Hypertension Father   . Diabetes Father   . Heart disease Father   . Prostate cancer Father   . Heart attack Father     x 2  . Colon cancer Neg Hx   . Other Neg Hx     pheochromocytoma  . Colon polyps Neg Hx    Social History:  reports that [name] quit smoking about 17 years ago. Her smoking use included Cigarettes. [name] has never used smokeless tobacco. [name] reports that [name] does not drink alcohol or use illicit drugs.  Allergies:  Allergies  Allergen Reactions  . Gadolinium Derivatives Swelling and Other (See  Comments)    Arm swelling, tingling, & redness. Radiologist Dr. Grace Isaac recommends an injection of steroids if patient needs a gadolinium based contrast in the future.    Medications Prior to Admission  Medication Sig Dispense Refill  . ALPRAZolam (XANAX) 0.5 MG tablet Take 1 tablet (0.5 mg total) by mouth 3 (three) times daily as needed for anxiety. 90 tablet 5  . BD INTEGRA SYRINGE 25G X 1" 3 ML MISC USE AS DIRECTED 50 each 1  . calcium carbonate (TUMS - DOSED IN MG ELEMENTAL CALCIUM) 500 MG chewable tablet Chew 1 tablet by mouth 2 (two) times daily as needed for indigestion or heartburn.    . Cholecalciferol (VITAMIN D3) 2000 UNITS TABS Take 2,000 Units by mouth daily.     . cyanocobalamin (,VITAMIN B-12,) 1000 MCG/ML injection INJECT 1 ML (1,000 MCG TOTAL) INTO THE MUSCLE EVERY 7 DAYS. 12 mL 3  . diazepam (VALIUM) 5 MG tablet Take 5-10 mg by mouth every 6 (six) hours as needed for muscle spasms.   0  . fluticasone (FLONASE) 50 MCG/ACT nasal spray Place 2 sprays  into both nostrils daily as needed for allergies.     Marland Kitchen gabapentin (NEURONTIN) 100 MG capsule Take 100-200 mg by mouth 3 (three) times daily as needed.   0  . gabapentin (NEURONTIN) 300 MG capsule Take 300 mg by mouth 2 (two) times daily.    . hydrochlorothiazide (HYDRODIURIL) 25 MG tablet TAKE 1 TABLET BY MOUTH EVERY DAY (Patient taking differently: TAKE 25 MG BY MOUTH EVERY DAY) 90 tablet 3  . hyoscyamine (LEVSIN/SL) 0.125 MG SL tablet Take 1 tab under the tongue every 4-6 hours. (Patient taking differently: Place 0.125 mg under the tongue every 4 (four) hours as needed for cramping. ) 40 tablet 0  . Multiple Vitamins-Minerals (MULTI-VITAMIN GUMMIES PO) Take 2 tablets by mouth daily.     . ondansetron (ZOFRAN ODT) 4 MG disintegrating tablet Take 1 tablet (4 mg total) by mouth every 8 (eight) hours as needed for nausea or vomiting. 20 tablet 0  . oxyCODONE-acetaminophen (PERCOCET) 10-325 MG per tablet Take 1 tablet by mouth every 6 (six)  hours as needed for pain. 60 tablet 0  . oxyCODONE-acetaminophen (PERCOCET) 5-325 MG tablet Take 1-2 tablets by mouth every 4 (four) hours as needed. 15 tablet 0  . pantoprazole (PROTONIX) 40 MG tablet TAKE 1 TABLET BY MOUTH TWICE A DAY (Patient taking differently: TAKE 40 MG BY MOUTH TWICE A DAY) 180 tablet 1  . promethazine (PHENERGAN) 25 MG tablet TAKE 1 TABLET BY MOUTH EVERY 6 HOURS AS NEEDED FOR NAUSEA (Patient taking differently: TAKE 25 MG BY MOUTH EVERY 6 HOURS AS NEEDED FOR NAUSEA) 90 tablet 3  . simethicone (MYLICON) 80 MG chewable tablet Chew 160 mg by mouth every 6 (six) hours as needed (gas pain).    . sucralfate (CARAFATE) 1 GM/10ML suspension Take 10 mLs (1 g total) by mouth 4 (four) times daily -  with meals and at bedtime. 420 mL 2  . SUMAtriptan (IMITREX) 50 MG tablet TAKE 1 TABLET BY MOUTH AT ONSET OF HEADACHE MAY REPEAT ONCE IN 2 HOURS IF PERSISTS OR RECURS (Patient taking differently: TAKE 150 MG BY MOUTH AT ONSET OF HEADACHE MAY REPEAT ONCE IN 2 HOURS IF PERSISTS OR RECURS) 9 tablet 2  . fluconazole (DIFLUCAN) 150 MG tablet TAKE 1 TABLET (150 MG TOTAL) BY MOUTH DAILY. 2 tablet 1  . LORazepam (ATIVAN) 1 MG tablet Take 1 tablet (1 mg total) by mouth every 8 (eight) hours as needed (esophageal spasm pain). (Patient not taking: Reported on 09/04/2015) 15 tablet 0    No results found for this or any previous visit (from the past 48 hour(s)). No results found.  Review of Systems  Constitutional: Negative for fever and chills.  HENT: Negative for hearing loss.   Eyes: Negative for blurred vision and double vision.  Respiratory: Negative for cough and hemoptysis.   Cardiovascular: Negative for chest pain and palpitations.  Gastrointestinal: Positive for nausea and abdominal pain. Negative for vomiting.  Genitourinary: Negative for dysuria and urgency.  Musculoskeletal: Positive for back pain. Negative for myalgias and neck pain.  Skin: Negative for itching and rash.  Neurological:  Negative for dizziness, tingling and headaches.  Endo/Heme/Allergies: Does not bruise/bleed easily.  Psychiatric/Behavioral: Negative for depression and suicidal ideas.    Blood pressure 127/81, pulse 112, temperature 98.7 F (37.1 C), temperature source Oral, resp. rate 20, weight 117.482 kg (259 lb), last menstrual period 12/13/1999, SpO2 96 %. Physical Exam  Vitals reviewed. Constitutional: [name] is oriented to person, place, and time. [name] appears well-developed and  well-nourished.  HENT:  Head: Normocephalic and atraumatic.  Eyes: Conjunctivae and EOM are normal. Pupils are equal, round, and reactive to light.  Neck: Normal range of motion. Neck supple.  Cardiovascular: Normal rate and regular rhythm.   Respiratory: Effort normal and breath sounds normal.  GI: Soft. Bowel sounds are normal. [name] exhibits no distension. There is tenderness.  Musculoskeletal: Normal range of motion.  Neurological: [name] is alert and oriented to person, place, and time.  Skin: Skin is warm and dry.  Psychiatric: [name] has a normal mood and affect. Her behavior is normal.     Assessment/Plan 40 yo female with symptomatic gallstones -lap chole  Rodman PickleLuke Aaron Flossie Wexler, MD 10/03/2015, 2:37 PM

## 2015-10-03 NOTE — Progress Notes (Signed)
Dr. Noreene LarssonJoslin at bedside to evaluate patient for pain control. Pt states she is still 10/10 pain, crying, with stabbing pain after 100 mcg Fentanyl and 1 mg Dilaudid.  Versed ordered and given and Dr. Noreene LarssonJoslin returned to bedside to push 80 mcg IV Precedex.

## 2015-10-03 NOTE — OR Nursing (Signed)
Pt ambulated around the unit twice, passed flatus, and ate graham crackers.  Pt and husband stated that prescription was ordered to be given in conjunction with percocet 10 mg.  Pt stated that her pain was tolerable and she would go home.

## 2015-10-03 NOTE — Anesthesia Procedure Notes (Signed)
Procedure Name: Intubation Date/Time: 10/03/2015 4:00 PM Performed by: Reine JustFLOWERS, Aimar Borghi T Pre-anesthesia Checklist: Patient identified, Emergency Drugs available, Suction available, Patient being monitored and Timeout performed Patient Re-evaluated:Patient Re-evaluated prior to inductionOxygen Delivery Method: Circle system utilized and Simple face mask Preoxygenation: Pre-oxygenation with 100% oxygen Intubation Type: IV induction Ventilation: Mask ventilation without difficulty Laryngoscope Size: Miller and 2 Grade View: Grade I Tube type: Oral Tube size: 7.5 mm Number of attempts: 1 Airway Equipment and Method: Patient positioned with wedge pillow and Stylet Placement Confirmation: ETT inserted through vocal cords under direct vision,  positive ETCO2 and breath sounds checked- equal and bilateral Secured at: 21 cm Tube secured with: Tape Dental Injury: Teeth and Oropharynx as per pre-operative assessment

## 2015-10-03 NOTE — OR Nursing (Signed)
Pt c/o 8/10 abd pain.  Pt has voided and is tolerating clear liquids.  Dr. Derrell Lollingamirez notified of pts continued pain despite pain meds and ambulation.  Order received for Percocet 10mg  po x1.  Will update Dr. Derrell Lollingamirez of result 30 mins after dose is given.

## 2015-10-03 NOTE — Anesthesia Preprocedure Evaluation (Addendum)
Anesthesia Evaluation  Patient identified by MRN, date of birth, ID band Patient awake    Reviewed: Allergy & Precautions, NPO status , Patient's Chart, lab work & pertinent test results  Airway Mallampati: III  TM Distance: >3 FB Neck ROM: Full    Dental  (+) Teeth Intact, Dental Advisory Given   Pulmonary neg pulmonary ROS, former smoker,    breath sounds clear to auscultation       Cardiovascular hypertension, + dysrhythmias  Rhythm:Regular Rate:Normal     Neuro/Psych  Headaches, PSYCHIATRIC DISORDERS Anxiety Depression  Neuromuscular disease    GI/Hepatic Neg liver ROS, GERD  Medicated,  Endo/Other  negative endocrine ROS  Renal/GU negative Renal ROS  negative genitourinary   Musculoskeletal  (+) Arthritis , Fibromyalgia -  Abdominal   Peds negative pediatric ROS (+)  Hematology negative hematology ROS (+)   Anesthesia Other Findings   Reproductive/Obstetrics                           Lab Results  Component Value Date   WBC 8.6 09/25/2015   HGB 14.5 09/25/2015   HCT 42.8 09/25/2015   MCV 84.1 09/25/2015   PLT 326 09/25/2015   Lab Results  Component Value Date   CREATININE 0.93 09/25/2015   BUN 12 09/25/2015   NA 138 09/25/2015   K 3.5 09/25/2015   CL 98* 09/25/2015   CO2 28 09/25/2015   No results found for: INR, PROTIME  07/2015 EKG: normal sinus rhythm.   Anesthesia Physical Anesthesia Plan  ASA: III  Anesthesia Plan: General   Post-op Pain Management:    Induction: Intravenous  Airway Management Planned: Oral ETT  Additional Equipment:   Intra-op Plan:   Post-operative Plan: Extubation in OR  Informed Consent: I have reviewed the patients History and Physical, chart, labs and discussed the procedure including the risks, benefits and alternatives for the proposed anesthesia with the patient or authorized representative who has indicated his/her understanding  and acceptance.   Dental advisory given  Plan Discussed with: CRNA  Anesthesia Plan Comments:         Anesthesia Quick Evaluation

## 2015-10-03 NOTE — Op Note (Signed)
Preoperative diagnosis: symptomatic gallstones  Postoperative diagnosis: Same   Procedure: laparoscopic cholecystectomy, removal of skin tag from left lower chest  Surgeon: Feliciana RossettiLuke Kinsinger, M.D.  Asst: none  Anesthesia: Gen.   Indications for procedure: Osborne OmanMyranda Keir is a 40 y.o. female with symptoms of Abdominal pain and RUQ pain consistent with gallbladder disease, Confirmed by Ultrasound.  Description of procedure: The patient was brought into the operative suite, placed supine. Anesthesia was administered with endotracheal tube. Patient was strapped in place and foot board was secured. All pressure points were offloaded by foam padding. The patient was prepped and draped in the usual sterile fashion.  A small incision was made to the right of the umbilicus. A 5mm trocar was inserted into the peritoneal cavity with optical entry. Pneumoperitoneum was applied with high flow low pressure. 2 5mm trocars were placed in the RUQ. A 12mm trocar was placed in the subxiphoid space. All trocars sites were first anesthesized with 0.25% marcaine with epinephrine in the subcutaneous and preperitoneal layers. Next the patient was placed in reverse trendelenberg. The infundibulum of the gallbladder was surrounded by a sheet of fat. The liver looked healthy, the stomach had no external abnormalities.  The gallbladder was retracted cephalad and lateral. The peritoneum was reflected off the infundibulum working lateral to medial. The cystic duct and cystic artery were identified and further dissection revealed a critical view.  The cystic duct and cystic artery were doubly clipped and ligated.   The gallbladder was removed off the liver bed with cautery. The gallbladder was entered in this process, all bile was removed with suction. The Gallbladder was placed in a specimen bag. The gallbladder fossa was irrigated and hemostasis was applied with cautery. The gallbladder was removed via the 12mm trocar. No  dilation occurred, so the fascia was not closed. Pneumoperitoneum was removed, all trocar were removed. All incisions were closed with 4-0 monocryl subcuticular stitch. Liquiband was put in place for dressing.  The skin tag over the left chest was cut off with a knife and the area was closed with liquiband.  The patient woke from anesthesia and was brought to PACU in stable condition.  Findings: The infundibulum of the gallbladder was surrounded by a sheet of fat. The liver looked healthy, the stomach had no external abnormalities.  Specimen: gallbldadder  Blood loss: <5950ml  Local anesthesia: 20ml 0.25% marcaine w epi  Complications: none  Feliciana RossettiLuke Kinsinger, M.D. General, Bariatric, & Minimally Invasive Surgery [facility]

## 2015-10-04 ENCOUNTER — Encounter (HOSPITAL_COMMUNITY): Payer: Self-pay | Admitting: General Surgery

## 2015-10-07 ENCOUNTER — Emergency Department (HOSPITAL_BASED_OUTPATIENT_CLINIC_OR_DEPARTMENT_OTHER)
Admission: EM | Admit: 2015-10-07 | Discharge: 2015-10-07 | Disposition: A | Discharge status: Home / Self Care | Payer: BLUE CROSS/BLUE SHIELD | Attending: Emergency Medicine | Admitting: Emergency Medicine

## 2015-10-07 ENCOUNTER — Encounter (HOSPITAL_BASED_OUTPATIENT_CLINIC_OR_DEPARTMENT_OTHER): Payer: Self-pay | Admitting: *Deleted

## 2015-10-07 DIAGNOSIS — E785 Hyperlipidemia, unspecified: Secondary | ICD-10-CM | POA: Insufficient documentation

## 2015-10-07 DIAGNOSIS — Z87891 Personal history of nicotine dependence: Secondary | ICD-10-CM | POA: Diagnosis not present

## 2015-10-07 DIAGNOSIS — I809 Phlebitis and thrombophlebitis of unspecified site: Secondary | ICD-10-CM | POA: Diagnosis not present

## 2015-10-07 DIAGNOSIS — F329 Major depressive disorder, single episode, unspecified: Secondary | ICD-10-CM | POA: Insufficient documentation

## 2015-10-07 DIAGNOSIS — I82621 Acute embolism and thrombosis of deep veins of right upper extremity: Secondary | ICD-10-CM | POA: Insufficient documentation

## 2015-10-07 DIAGNOSIS — M79631 Pain in right forearm: Secondary | ICD-10-CM | POA: Diagnosis present

## 2015-10-07 MED ORDER — CEPHALEXIN 500 MG PO CAPS: 500.0000 mg | ORAL_CAPSULE | Freq: Four times a day (QID) | ORAL | Status: DC

## 2015-10-07 MED ORDER — HYDROCODONE-ACETAMINOPHEN 5-325 MG PO TABS: 1.0000 | ORAL_TABLET | Freq: Four times a day (QID) | ORAL | Status: DC | PRN

## 2015-10-07 NOTE — ED Notes (Signed)
Pt verbalizes understanding of d/c instructions and denies any further needs at this time. 

## 2015-10-07 NOTE — ED Notes (Signed)
MD at bedside. 

## 2015-10-07 NOTE — Discharge Instructions (Signed)
Keflex as prescribed.  Ibuprofen 600 mg 3 times daily for the next several days. Hydrocodone as prescribed as needed for pain not relieved with ibuprofen.  Apply a heating pad or warm soaks as frequently as possible for the next several days.  Return to the ER if symptoms significantly worsen or change.   Phlebitis Phlebitis is soreness and swelling (inflammation) of a vein. This can occur in your arms, legs, or torso (trunk), as well as deeper inside your body. Phlebitis is usually not serious when it occurs close to the surface of the body. However, it can cause serious problems when it occurs in a vein deeper inside the body. CAUSES  Phlebitis can be triggered by various things, including:   Reduced blood flow through your veins. This can happen with:  Bed rest over a long period.  Long-distance travel.  Injury.  Surgery.  Being overweight (obese) or pregnant.  Having an IV tube put in the vein and getting certain medicines through the vein.  Cancer and cancer treatment.  Use of illegal drugs taken through the vein.  Inflammatory diseases.  Inherited (genetic) diseases that increase the risk of blood clots.  Hormone therapy, such as birth control pills. SIGNS AND SYMPTOMS   Red, tender, swollen, and painful area on your skin. Usually, the area will be long and narrow.  Firmness along the center of the affected area. This can indicate that a blood clot has formed.  Low-grade fever. DIAGNOSIS  A health care provider can usually diagnose phlebitis by examining the affected area and asking about your symptoms. To check for infection or blood clots, your health care provider may order blood tests or an ultrasound exam of the area. Blood tests and your family history may also indicate if you have an underlying genetic disease that causes blood clots. Occasionally, a piece of tissue is taken from the body (biopsy sample) if an unusual cause of phlebitis is  suspected. TREATMENT  Treatment will vary depending on the severity of the condition and the area of the body affected. Treatment may include:  Use of a warm compress or heating pad.  Use of compression stockings or bandages.  Anti-inflammatory medicines.  Removal of any IV tube that may be causing the problem.  Medicines that kill germs (antibiotics) if an infection is present.  Blood-thinning medicines if a blood clot is suspected or present.  In rare cases, surgery may be needed to remove damaged sections of vein. HOME CARE INSTRUCTIONS   Only take over-the-counter or prescription medicines as directed by your health care provider. Take all medicines exactly as prescribed.  Raise (elevate) the affected area above the level of your heart as directed by your health care provider.  Apply a warm compress or heating pad to the affected area as directed by your health care provider. Do not sleep with the heating pad.  Use compression stockings or bandages as directed. These will speed healing and prevent the condition from coming back.  If you are on blood thinners:  Get follow-up blood tests as directed by your health care provider.  Check with your health care provider before using any new medicines.  Carry a medical alert card or wear your medical alert jewelry to show that you are on blood thinners.  For phlebitis in the legs:  Avoid prolonged standing or bed rest.  Keep your legs moving. Raise your legs when sitting or lying.  Do not smoke.  Women, particularly those over the age of 41,  should consider the risks and benefits of taking the contraceptive pill. This kind of hormone treatment can increase your risk for blood clots.  Follow up with your health care provider as directed. SEEK MEDICAL CARE IF:   You have unusual bruising or any bleeding problems.  Your swelling or pain in the affected area is not improving.  You are on anti-inflammatory medicine, and  you develop belly (abdominal) pain. SEEK IMMEDIATE MEDICAL CARE IF:   You have a sudden onset of chest pain or difficulty breathing.  You have a fever or persistent symptoms for more than 2-3 days.  You have a fever and your symptoms suddenly get worse. MAKE SURE YOU:  Understand these instructions.  Will watch your condition.  Will get help right away if you are not doing well or get worse.   This information is not intended to replace advice given to you by your health care provider. Make sure you discuss any questions you have with your health care provider.   Document Released: 05/12/2001 Document Revised: 03/08/2013 Document Reviewed: 01/23/2013 Elsevier Interactive Patient Education [employer]

## 2015-10-07 NOTE — ED Provider Notes (Signed)
CSN: [id number]     Arrival date & time 10/07/15  1831 History  By signing my name below, I, Linus GalasMaharshi Patel, attest that this documentation has been prepared under the direction and in the presence of Geoffery Lyonsouglas Anika Shore, MD. Electronically Signed: Linus GalasMaharshi Patel, ED Scribe. 10/07/2015. 7:22 PM.   Chief Complaint  Patient presents with  . Arm Pain   The history is provided by the patient. No language interpreter was used.   HPI Comments: [name] is a 40 y.o. female who presents to the Emergency Department with no pertinent PMHx complaining of right forearm pain that began 4 days ago. Pt states she had a cholecystectomy 4 days ago and had an IV placed near her right wrist. Since then, she notes pain upon palpation over the IV site. She also notes swelling and a "red line down her arm from the IV site with a knot at the end. Pt denies any abdominal pain, numbness, weakness, or any other symptoms at this time.   Past Medical History  Diagnosis Date  . Allergy   . GERD (gastroesophageal reflux disease)   . Polycystic ovaries   . B12 deficiency   . Peripheral neuropathy (HCC)   . Fibromyalgia   . Migraine   . Anxiety   . Intervertebral disc protrusion 01/11/2014  . Dysrhythmia     hx tachy-was from medication nortriptylline  . Esophagitis     rx  . History of kidney stones   . Hyperlipidemia   . Depression     not currently    Past Surgical History  Procedure Laterality Date  . Appendectomy    . Vaginal hysterectomy    . Tonsillectomy    . Sinusotomy  12/14/06  . Wrist ganglion excision Left   . Ovarian cyst removal    . Oophorectomy      left overy  . Rectocele repair    . Bladder tack  2012  . Back surgery  2012    left laminectomy at L3-4 per Dr. Phoebe PerchHirsch   . Lumbar disc surgery  01-31-14    per Dr. Wynetta Emeryram   . Spinal fusion  2016/10  . Tonsillectomy    . Cholecystectomy N/A 10/03/2015    Procedure: LAPAROSCOPIC CHOLECYSTECTOMY;  Surgeon: Rodman PickleLuke Aaron Kinsinger, MD;  [facility];  Service: General;  Laterality: N/A;  . Excision of skin tag  10/03/2015    Procedure: EXCISION OF SKIN TAG;  Surgeon: De BlanchLuke Aaron Kinsinger, MD;  [facility];  Service: General;;   Family History  Problem Relation Age of Onset  . Fibromyalgia Mother   . Diabetes Mother   . Thyroid cancer Mother   . Liver disease Mother   . Neuropathy Mother   . Cirrhosis Mother     non-alcoholic  . Pulmonary embolism Mother     both lungs  . Hypertension Father   . Diabetes Father   . Heart disease Father   . Prostate cancer Father   . Heart attack Father     x 2  . Colon cancer Neg Hx   . Other Neg Hx     pheochromocytoma  . Colon polyps Neg Hx    Social History  Substance Use Topics  . Smoking status: Former Smoker    Types: Cigarettes    Quit date: 06/01/1998  . Smokeless tobacco: Never Used  . Alcohol Use: No   OB History    Gravida Para Term Preterm AB TAB SAB Ectopic Multiple Living   2  2  2 0     2     Review of Systems  A complete 10 system review of systems was obtained and all systems are negative except as noted in the HPI and PMH.   Allergies  Gadolinium derivatives  Home Medications   Prior to Admission medications   Medication Sig Start Date End Date Taking? Authorizing Provider  ALPRAZolam [name]) 0.5 MG tablet Take 1 tablet (0.5 mg total) by mouth 3 (three) times daily as needed for anxiety. 03/26/15   [name], MD  BD INTEGRA SYRINGE 25G X 1" 3 ML MISC USE AS DIRECTED 10/24/14   [name], MD  calcium carbonate (TUMS - DOSED IN MG ELEMENTAL CALCIUM) 500 MG chewable tablet Chew 1 tablet by mouth 2 (two) times daily as needed for indigestion or heartburn.    Historical Provider, MD  Cholecalciferol (VITAMIN D3) 2000 UNITS TABS Take 2,000 Units by mouth daily.     Historical Provider, MD  cyanocobalamin (,VITAMIN B-12,) 1000 MCG/ML injection INJECT 1 ML (1,000 MCG TOTAL) INTO THE MUSCLE EVERY 7 DAYS. 10/24/14   [name], MD  diazepam (VALIUM) 5 MG  tablet Take 5-10 mg by mouth every 6 (six) hours as needed for muscle spasms.  06/20/15   Historical Provider, MD  fluticasone (FLONASE) 50 MCG/ACT nasal spray Place 2 sprays into both nostrils daily as needed for allergies.     Historical Provider, MD  gabapentin (NEURONTIN) 100 MG capsule Take 100-200 mg by mouth 3 (three) times daily as needed.  08/06/15   Historical Provider, MD  gabapentin (NEURONTIN) 300 MG capsule Take 300 mg by mouth 2 (two) times daily.    Historical Provider, MD  hydrochlorothiazide (HYDRODIURIL) 25 MG tablet TAKE 1 TABLET BY MOUTH EVERY DAY Patient taking differently: TAKE 25 MG BY MOUTH EVERY DAY 02/12/15   [name], MD  hyoscyamine (LEVSIN/SL) 0.125 MG SL tablet Take 1 tab under the tongue every 4-6 hours. Patient taking differently: Place 0.125 mg under the tongue every 4 (four) hours as needed for cramping.  08/29/15   Amy S Esterwood, PA-C  Multiple Vitamins-Minerals (MULTI-VITAMIN GUMMIES PO) Take 2 tablets by mouth daily.     Historical Provider, MD  ondansetron (ZOFRAN ODT) 4 MG disintegrating tablet Take 1 tablet (4 mg total) by mouth every 8 (eight) hours as needed for nausea or vomiting. 04/10/15   Donalee Citrin, MD  Oxycodone HCl 10 MG TABS Take 1 tablet (10 mg total) by mouth every 4 (four) hours as needed (for pain). 10/03/15   Rodman Pickle, MD  oxyCODONE-acetaminophen (PERCOCET) 10-325 MG per tablet Take 1 tablet by mouth every 6 (six) hours as needed for pain. 11/30/13   [name], MD  pantoprazole (PROTONIX) 40 MG tablet TAKE 1 TABLET BY MOUTH TWICE A DAY Patient taking differently: TAKE 40 MG BY MOUTH TWICE A DAY 06/17/15   [name], MD  promethazine (PHENERGAN) 25 MG tablet TAKE 1 TABLET BY MOUTH EVERY 6 HOURS AS NEEDED FOR NAUSEA Patient taking differently: TAKE 25 MG BY MOUTH EVERY 6 HOURS AS NEEDED FOR NAUSEA 07/15/15   [name], MD  simethicone (MYLICON) 80 MG chewable tablet Chew 160 mg by mouth every 6 (six) hours as needed (gas pain).     Historical Provider, MD  sucralfate (CARAFATE) 1 GM/10ML suspension Take 10 mLs (1 g total) by mouth 4 (four) times daily -  with meals and at bedtime. 08/29/15   Amy S  Esterwood, PA-C  SUMAtriptan (IMITREX) 50 MG tablet TAKE 1 TABLET BY MOUTH AT ONSET OF HEADACHE MAY REPEAT ONCE IN 2 HOURS IF PERSISTS OR RECURS Patient taking differently: TAKE 150 MG BY MOUTH AT ONSET OF HEADACHE MAY REPEAT ONCE IN 2 HOURS IF PERSISTS OR RECURS 01/15/15   [name], DO   BP 149/100 mmHg  Pulse 125  Temp(Src) 98.5 F (36.9 C) (Oral)  Resp 18  Ht 5' 9.5" (1.765 m)  Wt 259 lb (117.482 kg)  BMI 37.71 kg/m2  SpO2 97%  LMP 12/13/1999   Physical Exam  Constitutional: She is oriented to person, place, and time. She appears well-developed and well-nourished.  HENT:  Head: Normocephalic and atraumatic.  Cardiovascular: Normal rate.   Pulmonary/Chest: Effort normal.  Musculoskeletal:  Volar aspect of the right forearm has a small area of lymphangitic streaking. Mild warmth and tenderness to touch.   Neurological: She is alert and oriented to person, place, and time.  Skin: Skin is warm and dry.  Psychiatric: She has a normal mood and affect.  Nursing note and vitals reviewed.   ED Course  Procedures DIAGNOSTIC STUDIES: Oxygen Saturati6n is 97% on room air, normal by my interpretation.    COORDINATION OF CARE: 7:19 PM Discussed treatment plan with pt at bedside and pt agreed to plan.  MDM   Final diagnoses:  None    This Appears to be superficial phlebitis and possibly mild cellulitis. She will be treated with antibiotics, anti-inflammatories, and when necessary return.  I personally performed the services described in this documentation, which was scribed in my presence. The recorded information has been reviewed and is accurate.       Geoffery Lyons, MD 10/08/15 2223

## 2015-10-07 NOTE — ED Notes (Signed)
Right arm is swollen, and painful over an IV site. She had gallbladder surgery 4 days ago.

## 2015-10-07 NOTE — ED Notes (Signed)
Discussed heart rate with Dr. Judd Lienelo who stated that it was ok to discharge the patient

## 2015-10-08 ENCOUNTER — Telehealth: Payer: Self-pay | Admitting: Family Medicine

## 2015-10-08 NOTE — Telephone Encounter (Signed)
FYI.  Pt seen in ED  [facility] - Client TELEPHONE ADVICE RECORD [facility] Patient Name: [name] Gender: Female DOB: 10/17/1975 Age: [age] Y 3 M 3 D Return Phone Number: [phone] (Primary) Address: City/State/Zip: [place] Client [facility] - Client Client Site [facility] Physician Gershon CraneFry, Stephen - MD Contact Type Call Who Is Calling Patient / Member / Family / Caregiver Call Type Triage / Clinical Caller Name Deion Relationship To Patient Self Return Phone Number [phone] (Primary) Chief Complaint Swelling (generalized) Reason for Call Symptomatic / Request for Health Information Initial Comment Caller states I had surgery on Thursday I had a IV done and it looks swollen and red now Appointment Disposition EMR Appointment Not Necessary Info pasted into Epic No PreDisposition Call Doctor Translation No Nurse Assessment Nurse: Lucianne LeiGreenawalt, RN, Lanora ManisElizabeth Date/Time (Eastern Time): 10/07/2015 4:27:44 PM Confirm and document reason for call. If symptomatic, describe symptoms. You must click the next button to save text entered. ---Patient states she had an IV on her right wrist when she had surgery 4 days ago. Now the area is red and tender. Has the patient traveled out of the country within the last 30 days? ---Not Applicable Does the patient have any new or worsening symptoms? ---Yes Will a triage be completed? ---Yes Related visit to physician within the last 2 weeks? ---No Does the PT have any chronic conditions? (i.e. diabetes, asthma, etc.) ---No Is the patient pregnant or possibly pregnant? (Ask all females between the ages of 6012-55) ---No Is this a behavioral health or substance abuse call? ---No Guidelines Guideline Title Affirmed Question Affirmed Notes Nurse Date/Time (Eastern Time) IV Site (Skin) Symptoms [1] Red streak at IV site AND [2] longer than 1 inch  (2.5 cm) Greenawalt, RN, Lanora ManisElizabeth 10/07/2015 4:31:10 PM Disp. Time Lamount Cohen(Eastern Time) Disposition Final User PLEASE NOTE: All timestamps contained within this report are represented as [place] Standard Time. CONFIDENTIALTY NOTICE: This fax transmission is intended only for the addressee. It contains information that is legally privileged, confidential or otherwise protected from use or disclosure. If you are not the intended recipient, you are strictly prohibited from reviewing, disclosing, copying using or disseminating any of this information or taking any action in reliance on or regarding this information. If you have received this fax in error, please notify us immediately by telephone so that we can arrange for its return to us. Phone: [phone], Toll-Free: [phone], Fax: [phone] Page: 2 of 2 Call Id: [id number] 10/07/2015 4:33:38 PM See Physician within 4 Hours (or PCP triage) Yes Greenawalt, RN, Heloise PurpuraElizabeth Caller Understands: Yes Disagree/Comply: Comply Care Advice Given Per Guideline SEE PHYSICIAN WITHIN 4 HOURS (or PCP triage): * IF OFFICE WILL BE OPEN: You need to be seen within the next 3 or 4 hours. Call your doctor's office now or as soon as it opens. * IF OFFICE WILL BE CLOSED AND NO PCP TRIAGE: You need to be seen within the next 3 or 4 hours. A nearby Urgent Care Center is often a good source of care. Another choice is to go to the ER. Go sooner if you become worse. PAIN MEDICINES: * For pain relief, take acetaminophen, ibuprofen, or naproxen. * Use the lowest amount that makes your pain feel better. CALL BACK IF: * You become worse. Comments User: Larrie KassElizabeth, Greenawalt, RN Date/Time Lamount Cohen(Eastern Time): 10/07/2015 4:34:41 PM Patient is taking pain medication after surgery, no additional medication recommended. Referrals GO TO FACILITY UNDECIDED

## 2015-10-15 DIAGNOSIS — R030 Elevated blood-pressure reading, without diagnosis of hypertension: Secondary | ICD-10-CM | POA: Diagnosis not present

## 2015-10-15 DIAGNOSIS — M5137 Other intervertebral disc degeneration, lumbosacral region: Secondary | ICD-10-CM | POA: Diagnosis not present

## 2015-10-15 DIAGNOSIS — Z6836 Body mass index (BMI) 36.0-36.9, adult: Secondary | ICD-10-CM | POA: Diagnosis not present

## 2015-10-16 ENCOUNTER — Other Ambulatory Visit: Payer: Self-pay | Admitting: General Surgery

## 2015-10-25 ENCOUNTER — Other Ambulatory Visit: Payer: Self-pay | Admitting: Family Medicine

## 2015-11-11 DIAGNOSIS — R202 Paresthesia of skin: Secondary | ICD-10-CM | POA: Diagnosis not present

## 2015-11-11 DIAGNOSIS — M545 Low back pain: Secondary | ICD-10-CM | POA: Diagnosis not present

## 2015-11-11 DIAGNOSIS — M6249 Contracture of muscle, multiple sites: Secondary | ICD-10-CM | POA: Diagnosis not present

## 2015-11-11 DIAGNOSIS — M6281 Muscle weakness (generalized): Secondary | ICD-10-CM | POA: Diagnosis not present

## 2015-11-13 DIAGNOSIS — M6281 Muscle weakness (generalized): Secondary | ICD-10-CM | POA: Diagnosis not present

## 2015-11-13 DIAGNOSIS — M6249 Contracture of muscle, multiple sites: Secondary | ICD-10-CM | POA: Diagnosis not present

## 2015-11-13 DIAGNOSIS — M545 Low back pain: Secondary | ICD-10-CM | POA: Diagnosis not present

## 2015-11-13 DIAGNOSIS — R202 Paresthesia of skin: Secondary | ICD-10-CM | POA: Diagnosis not present

## 2015-11-14 DIAGNOSIS — M5137 Other intervertebral disc degeneration, lumbosacral region: Secondary | ICD-10-CM | POA: Diagnosis not present

## 2015-11-14 DIAGNOSIS — M542 Cervicalgia: Secondary | ICD-10-CM | POA: Diagnosis not present

## 2015-11-18 DIAGNOSIS — R202 Paresthesia of skin: Secondary | ICD-10-CM | POA: Diagnosis not present

## 2015-11-18 DIAGNOSIS — M6281 Muscle weakness (generalized): Secondary | ICD-10-CM | POA: Diagnosis not present

## 2015-11-18 DIAGNOSIS — M545 Low back pain: Secondary | ICD-10-CM | POA: Diagnosis not present

## 2015-11-18 DIAGNOSIS — M6249 Contracture of muscle, multiple sites: Secondary | ICD-10-CM | POA: Diagnosis not present

## 2015-11-21 DIAGNOSIS — M545 Low back pain: Secondary | ICD-10-CM | POA: Diagnosis not present

## 2015-11-21 DIAGNOSIS — M6249 Contracture of muscle, multiple sites: Secondary | ICD-10-CM | POA: Diagnosis not present

## 2015-11-21 DIAGNOSIS — R202 Paresthesia of skin: Secondary | ICD-10-CM | POA: Diagnosis not present

## 2015-11-21 DIAGNOSIS — M6281 Muscle weakness (generalized): Secondary | ICD-10-CM | POA: Diagnosis not present

## 2015-11-23 ENCOUNTER — Other Ambulatory Visit: Payer: Self-pay | Admitting: Family Medicine

## 2015-11-26 ENCOUNTER — Encounter: Payer: Self-pay | Admitting: Family Medicine

## 2015-11-26 NOTE — Telephone Encounter (Signed)
I spoke with pt and this is the first message I have received concerning refills. Pt also needs Vitamin B 12 vial for injections, she is not sure when we needed to draw labs on this? Also request refill on Xanax.

## 2015-11-28 DIAGNOSIS — M6249 Contracture of muscle, multiple sites: Secondary | ICD-10-CM | POA: Diagnosis not present

## 2015-11-28 DIAGNOSIS — M6281 Muscle weakness (generalized): Secondary | ICD-10-CM | POA: Diagnosis not present

## 2015-11-28 DIAGNOSIS — R202 Paresthesia of skin: Secondary | ICD-10-CM | POA: Diagnosis not present

## 2015-11-28 DIAGNOSIS — M545 Low back pain: Secondary | ICD-10-CM | POA: Diagnosis not present

## 2015-11-28 NOTE — Telephone Encounter (Signed)
Call in Xanax #90 with 5 rf. Also refill B12 for one year, but she is due for a level check. Set her up for a B12 level soon

## 2015-11-29 ENCOUNTER — Ambulatory Visit (INDEPENDENT_AMBULATORY_CARE_PROVIDER_SITE_OTHER): Payer: BLUE CROSS/BLUE SHIELD | Admitting: Family Medicine

## 2015-11-29 ENCOUNTER — Encounter: Payer: Self-pay | Admitting: Family Medicine

## 2015-11-29 VITALS — BP 111/81 | HR 96 | Temp 99.0°F | Ht 69.5 in | Wt 261.0 lb

## 2015-11-29 DIAGNOSIS — J019 Acute sinusitis, unspecified: Secondary | ICD-10-CM

## 2015-11-29 MED ORDER — LEVOFLOXACIN 500 MG PO TABS: 500.0000 mg | ORAL_TABLET | Freq: Every day | ORAL | Status: AC

## 2015-11-29 MED ORDER — ALPRAZOLAM 0.5 MG PO TABS: 0.5000 mg | ORAL_TABLET | Freq: Three times a day (TID) | ORAL | Status: DC | PRN

## 2015-11-29 MED ORDER — CYANOCOBALAMIN 1000 MCG/ML IJ SOLN: INTRAMUSCULAR | Status: DC

## 2015-11-29 NOTE — Progress Notes (Signed)
Pre visit review using our clinic review tool, if applicable. No additional management support is needed unless otherwise documented below in the visit note. 

## 2015-11-29 NOTE — Telephone Encounter (Signed)
Pt is here for a office visit will go over below information.

## 2015-11-29 NOTE — Progress Notes (Signed)
   Subjective:    Patient ID: [name], female    DOB: 12/20/1975, 40 y.o.   MRN: [id number]  HPI Here for one week of sinus pressure, teeth pain, blowing yellow mucus from the nose and a ST. No fever or cough.    Review of Systems  Constitutional: Negative.   HENT: Positive for congestion, postnasal drip, sinus pressure and sore throat. Negative for ear pain.   Eyes: Negative.   Respiratory: Negative.        Objective:   Physical Exam  Constitutional: She appears well-developed and well-nourished.  HENT:  Right Ear: External ear normal.  Left Ear: External ear normal.  Nose: Nose normal.  Mouth/Throat: Oropharynx is clear and moist.  Eyes: Conjunctivae are normal.  Neck: No thyromegaly present.  Pulmonary/Chest: Effort normal and breath sounds normal.  Lymphadenopathy:    She has no cervical adenopathy.          Assessment & Plan:  Sinusitis, treat with Levaquin.  Nelwyn SalisburyFRY,[name], MD

## 2015-12-04 DIAGNOSIS — M6281 Muscle weakness (generalized): Secondary | ICD-10-CM | POA: Diagnosis not present

## 2015-12-04 DIAGNOSIS — M545 Low back pain: Secondary | ICD-10-CM | POA: Diagnosis not present

## 2015-12-04 DIAGNOSIS — R202 Paresthesia of skin: Secondary | ICD-10-CM | POA: Diagnosis not present

## 2015-12-04 DIAGNOSIS — M6249 Contracture of muscle, multiple sites: Secondary | ICD-10-CM | POA: Diagnosis not present

## 2015-12-08 ENCOUNTER — Other Ambulatory Visit: Payer: Self-pay | Admitting: Family Medicine

## 2015-12-09 DIAGNOSIS — M6249 Contracture of muscle, multiple sites: Secondary | ICD-10-CM | POA: Diagnosis not present

## 2015-12-09 DIAGNOSIS — R202 Paresthesia of skin: Secondary | ICD-10-CM | POA: Diagnosis not present

## 2015-12-09 DIAGNOSIS — M6281 Muscle weakness (generalized): Secondary | ICD-10-CM | POA: Diagnosis not present

## 2015-12-09 DIAGNOSIS — M545 Low back pain: Secondary | ICD-10-CM | POA: Diagnosis not present

## 2015-12-09 NOTE — Telephone Encounter (Signed)
Refill sent to pharmacy.   

## 2015-12-11 DIAGNOSIS — M542 Cervicalgia: Secondary | ICD-10-CM | POA: Diagnosis not present

## 2015-12-11 DIAGNOSIS — M5137 Other intervertebral disc degeneration, lumbosacral region: Secondary | ICD-10-CM | POA: Diagnosis not present

## 2015-12-11 DIAGNOSIS — M4326 Fusion of spine, lumbar region: Secondary | ICD-10-CM | POA: Diagnosis not present

## 2015-12-17 ENCOUNTER — Other Ambulatory Visit: Payer: Self-pay | Admitting: Family Medicine

## 2015-12-18 DIAGNOSIS — R202 Paresthesia of skin: Secondary | ICD-10-CM | POA: Diagnosis not present

## 2015-12-18 DIAGNOSIS — M6249 Contracture of muscle, multiple sites: Secondary | ICD-10-CM | POA: Diagnosis not present

## 2015-12-18 DIAGNOSIS — M6281 Muscle weakness (generalized): Secondary | ICD-10-CM | POA: Diagnosis not present

## 2015-12-18 DIAGNOSIS — M545 Low back pain: Secondary | ICD-10-CM | POA: Diagnosis not present

## 2015-12-20 DIAGNOSIS — M542 Cervicalgia: Secondary | ICD-10-CM | POA: Diagnosis not present

## 2015-12-20 DIAGNOSIS — M5137 Other intervertebral disc degeneration, lumbosacral region: Secondary | ICD-10-CM | POA: Diagnosis not present

## 2015-12-24 ENCOUNTER — Other Ambulatory Visit: Payer: Self-pay | Admitting: Neurosurgery

## 2015-12-24 DIAGNOSIS — M542 Cervicalgia: Secondary | ICD-10-CM

## 2015-12-25 DIAGNOSIS — M6281 Muscle weakness (generalized): Secondary | ICD-10-CM | POA: Diagnosis not present

## 2015-12-25 DIAGNOSIS — R202 Paresthesia of skin: Secondary | ICD-10-CM | POA: Diagnosis not present

## 2015-12-25 DIAGNOSIS — M6249 Contracture of muscle, multiple sites: Secondary | ICD-10-CM | POA: Diagnosis not present

## 2015-12-25 DIAGNOSIS — M545 Low back pain: Secondary | ICD-10-CM | POA: Diagnosis not present

## 2015-12-26 ENCOUNTER — Other Ambulatory Visit: Payer: Self-pay

## 2015-12-27 DIAGNOSIS — M6281 Muscle weakness (generalized): Secondary | ICD-10-CM | POA: Diagnosis not present

## 2015-12-27 DIAGNOSIS — M6249 Contracture of muscle, multiple sites: Secondary | ICD-10-CM | POA: Diagnosis not present

## 2015-12-27 DIAGNOSIS — R202 Paresthesia of skin: Secondary | ICD-10-CM | POA: Diagnosis not present

## 2015-12-27 DIAGNOSIS — M545 Low back pain: Secondary | ICD-10-CM | POA: Diagnosis not present

## 2015-12-30 DIAGNOSIS — M545 Low back pain: Secondary | ICD-10-CM | POA: Diagnosis not present

## 2015-12-30 DIAGNOSIS — R202 Paresthesia of skin: Secondary | ICD-10-CM | POA: Diagnosis not present

## 2015-12-30 DIAGNOSIS — M6281 Muscle weakness (generalized): Secondary | ICD-10-CM | POA: Diagnosis not present

## 2015-12-30 DIAGNOSIS — M6249 Contracture of muscle, multiple sites: Secondary | ICD-10-CM | POA: Diagnosis not present

## 2015-12-31 DIAGNOSIS — M545 Low back pain: Secondary | ICD-10-CM | POA: Diagnosis not present

## 2015-12-31 DIAGNOSIS — R202 Paresthesia of skin: Secondary | ICD-10-CM | POA: Diagnosis not present

## 2015-12-31 DIAGNOSIS — M6281 Muscle weakness (generalized): Secondary | ICD-10-CM | POA: Diagnosis not present

## 2015-12-31 DIAGNOSIS — M6249 Contracture of muscle, multiple sites: Secondary | ICD-10-CM | POA: Diagnosis not present

## 2016-01-03 ENCOUNTER — Other Ambulatory Visit: Payer: Self-pay | Admitting: Neurosurgery

## 2016-01-03 ENCOUNTER — Ambulatory Visit
Admission: RE | Admit: 2016-01-03 | Discharge: 2016-01-03 | Disposition: A | Discharge status: Home / Self Care | Payer: BLUE CROSS/BLUE SHIELD | Source: Ambulatory Visit | Attending: Neurosurgery | Admitting: Neurosurgery

## 2016-01-03 DIAGNOSIS — M545 Low back pain, unspecified: Secondary | ICD-10-CM

## 2016-01-03 DIAGNOSIS — M5440 Lumbago with sciatica, unspecified side: Secondary | ICD-10-CM

## 2016-01-03 DIAGNOSIS — M79605 Pain in left leg: Secondary | ICD-10-CM

## 2016-01-03 DIAGNOSIS — M4326 Fusion of spine, lumbar region: Secondary | ICD-10-CM | POA: Diagnosis not present

## 2016-01-03 DIAGNOSIS — M542 Cervicalgia: Secondary | ICD-10-CM

## 2016-01-06 DIAGNOSIS — M6281 Muscle weakness (generalized): Secondary | ICD-10-CM | POA: Diagnosis not present

## 2016-01-06 DIAGNOSIS — R202 Paresthesia of skin: Secondary | ICD-10-CM | POA: Diagnosis not present

## 2016-01-06 DIAGNOSIS — M6249 Contracture of muscle, multiple sites: Secondary | ICD-10-CM | POA: Diagnosis not present

## 2016-01-06 DIAGNOSIS — M545 Low back pain: Secondary | ICD-10-CM | POA: Diagnosis not present

## 2016-01-16 ENCOUNTER — Ambulatory Visit (INDEPENDENT_AMBULATORY_CARE_PROVIDER_SITE_OTHER): Payer: BLUE CROSS/BLUE SHIELD | Admitting: Licensed Clinical Social Worker

## 2016-01-16 DIAGNOSIS — F331 Major depressive disorder, recurrent, moderate: Secondary | ICD-10-CM | POA: Diagnosis not present

## 2016-01-16 DIAGNOSIS — R202 Paresthesia of skin: Secondary | ICD-10-CM | POA: Diagnosis not present

## 2016-01-16 DIAGNOSIS — M6281 Muscle weakness (generalized): Secondary | ICD-10-CM | POA: Diagnosis not present

## 2016-01-16 DIAGNOSIS — M6249 Contracture of muscle, multiple sites: Secondary | ICD-10-CM | POA: Diagnosis not present

## 2016-01-16 DIAGNOSIS — M545 Low back pain: Secondary | ICD-10-CM | POA: Diagnosis not present

## 2016-01-17 DIAGNOSIS — M79605 Pain in left leg: Secondary | ICD-10-CM | POA: Diagnosis not present

## 2016-01-17 DIAGNOSIS — M961 Postlaminectomy syndrome, not elsewhere classified: Secondary | ICD-10-CM | POA: Diagnosis not present

## 2016-01-17 DIAGNOSIS — R200 Anesthesia of skin: Secondary | ICD-10-CM | POA: Diagnosis not present

## 2016-01-20 ENCOUNTER — Ambulatory Visit (INDEPENDENT_AMBULATORY_CARE_PROVIDER_SITE_OTHER): Payer: BLUE CROSS/BLUE SHIELD | Admitting: Licensed Clinical Social Worker

## 2016-01-20 DIAGNOSIS — F331 Major depressive disorder, recurrent, moderate: Secondary | ICD-10-CM

## 2016-01-23 DIAGNOSIS — M5137 Other intervertebral disc degeneration, lumbosacral region: Secondary | ICD-10-CM | POA: Diagnosis not present

## 2016-01-24 ENCOUNTER — Ambulatory Visit: Payer: Self-pay | Admitting: Family Medicine

## 2016-01-24 ENCOUNTER — Encounter: Payer: Self-pay | Admitting: Family Medicine

## 2016-01-24 ENCOUNTER — Ambulatory Visit (INDEPENDENT_AMBULATORY_CARE_PROVIDER_SITE_OTHER): Payer: BLUE CROSS/BLUE SHIELD | Admitting: Family Medicine

## 2016-01-24 VITALS — BP 106/80 | Temp 98.2°F | Ht 69.5 in | Wt 260.0 lb

## 2016-01-24 DIAGNOSIS — M6281 Muscle weakness (generalized): Secondary | ICD-10-CM | POA: Diagnosis not present

## 2016-01-24 DIAGNOSIS — M6249 Contracture of muscle, multiple sites: Secondary | ICD-10-CM | POA: Diagnosis not present

## 2016-01-24 DIAGNOSIS — F329 Major depressive disorder, single episode, unspecified: Secondary | ICD-10-CM

## 2016-01-24 DIAGNOSIS — F411 Generalized anxiety disorder: Secondary | ICD-10-CM | POA: Diagnosis not present

## 2016-01-24 DIAGNOSIS — M545 Low back pain: Secondary | ICD-10-CM | POA: Diagnosis not present

## 2016-01-24 DIAGNOSIS — F32A Depression, unspecified: Secondary | ICD-10-CM

## 2016-01-24 DIAGNOSIS — R202 Paresthesia of skin: Secondary | ICD-10-CM | POA: Diagnosis not present

## 2016-01-24 MED ORDER — SERTRALINE HCL 50 MG PO TABS: 50.0000 mg | ORAL_TABLET | Freq: Every day | ORAL | 3 refills | Status: DC

## 2016-01-24 NOTE — Progress Notes (Signed)
   Subjective:    Patient ID: [name], female    DOB: 03/15/1976, 40 y.o.   MRN: [id number]  HPI Here to discuss depression and anxiety. She has dealt with these for years and she has tried meds like Nortyiptyline, Celexa, Wellbutrin, and Cymbalta. These either did not work or caused side effects. She has been having a tough time lately with the depression and her chronic pain has been worse. She has trouble sleeping.     Review of Systems  Constitutional: Negative.   Respiratory: Negative.   Cardiovascular: Negative.   Neurological: Negative.   Psychiatric/Behavioral: Positive for decreased concentration, dysphoric mood and sleep disturbance. Negative for agitation, behavioral problems, confusion, hallucinations, self-injury and suicidal ideas. The patient is nervous/anxious.        Objective:   Physical Exam  Constitutional: She is oriented to person, place, and time. She appears well-developed and well-nourished.  Cardiovascular: Normal rate, regular rhythm, normal heart sounds and intact distal pulses.   Pulmonary/Chest: Effort normal and breath sounds normal.  Neurological: She is alert and oriented to person, place, and time.  Psychiatric: Her behavior is normal. Thought content normal.  tearful          Assessment & Plan:  Depression. She will try Zoloft 50 mg daily. She is meeting with a therapist named Berniece AndreasJulie Whitt. Recheck in 3 weeks.Nelwyn SalisburyFRY,Bracen Schum A, MD

## 2016-01-24 NOTE — Progress Notes (Signed)
Pre visit review using our clinic review tool, if applicable. No additional management support is needed unless otherwise documented below in the visit note. 

## 2016-01-29 DIAGNOSIS — R202 Paresthesia of skin: Secondary | ICD-10-CM | POA: Diagnosis not present

## 2016-01-29 DIAGNOSIS — M545 Low back pain: Secondary | ICD-10-CM | POA: Diagnosis not present

## 2016-01-29 DIAGNOSIS — M6249 Contracture of muscle, multiple sites: Secondary | ICD-10-CM | POA: Diagnosis not present

## 2016-01-29 DIAGNOSIS — M6281 Muscle weakness (generalized): Secondary | ICD-10-CM | POA: Diagnosis not present

## 2016-01-30 ENCOUNTER — Ambulatory Visit (INDEPENDENT_AMBULATORY_CARE_PROVIDER_SITE_OTHER): Payer: BLUE CROSS/BLUE SHIELD | Admitting: Licensed Clinical Social Worker

## 2016-01-30 DIAGNOSIS — F331 Major depressive disorder, recurrent, moderate: Secondary | ICD-10-CM

## 2016-01-30 DIAGNOSIS — Z6837 Body mass index (BMI) 37.0-37.9, adult: Secondary | ICD-10-CM | POA: Diagnosis not present

## 2016-01-30 DIAGNOSIS — Z01419 Encounter for gynecological examination (general) (routine) without abnormal findings: Secondary | ICD-10-CM | POA: Diagnosis not present

## 2016-01-30 DIAGNOSIS — Z1231 Encounter for screening mammogram for malignant neoplasm of breast: Secondary | ICD-10-CM | POA: Diagnosis not present

## 2016-02-04 DIAGNOSIS — M47816 Spondylosis without myelopathy or radiculopathy, lumbar region: Secondary | ICD-10-CM | POA: Diagnosis not present

## 2016-02-07 DIAGNOSIS — M6281 Muscle weakness (generalized): Secondary | ICD-10-CM | POA: Diagnosis not present

## 2016-02-07 DIAGNOSIS — M545 Low back pain: Secondary | ICD-10-CM | POA: Diagnosis not present

## 2016-02-07 DIAGNOSIS — M6249 Contracture of muscle, multiple sites: Secondary | ICD-10-CM | POA: Diagnosis not present

## 2016-02-07 DIAGNOSIS — R202 Paresthesia of skin: Secondary | ICD-10-CM | POA: Diagnosis not present

## 2016-02-09 ENCOUNTER — Other Ambulatory Visit: Payer: Self-pay | Admitting: Family Medicine

## 2016-02-10 NOTE — Telephone Encounter (Signed)
Last filled on 11/1015, last OV 01/22/16. Ok to refill?

## 2016-02-11 ENCOUNTER — Encounter: Payer: Self-pay | Admitting: Family Medicine

## 2016-02-11 NOTE — Telephone Encounter (Signed)
Call in #90 with 5 rf 

## 2016-02-12 DIAGNOSIS — M545 Low back pain: Secondary | ICD-10-CM | POA: Diagnosis not present

## 2016-02-12 DIAGNOSIS — M6249 Contracture of muscle, multiple sites: Secondary | ICD-10-CM | POA: Diagnosis not present

## 2016-02-12 DIAGNOSIS — R202 Paresthesia of skin: Secondary | ICD-10-CM | POA: Diagnosis not present

## 2016-02-12 DIAGNOSIS — M6281 Muscle weakness (generalized): Secondary | ICD-10-CM | POA: Diagnosis not present

## 2016-02-12 NOTE — Telephone Encounter (Signed)
I would stay with the Zoloft for 2 more weeks and see if this does not settle down. If not, we may need to change it

## 2016-02-17 ENCOUNTER — Other Ambulatory Visit: Payer: Self-pay | Admitting: Family Medicine

## 2016-02-17 ENCOUNTER — Encounter: Payer: Self-pay | Admitting: Family Medicine

## 2016-02-17 ENCOUNTER — Ambulatory Visit (INDEPENDENT_AMBULATORY_CARE_PROVIDER_SITE_OTHER): Payer: BLUE CROSS/BLUE SHIELD | Admitting: Family Medicine

## 2016-02-17 VITALS — BP 117/91 | HR 92 | Temp 98.6°F | Ht 69.5 in | Wt 259.0 lb

## 2016-02-17 DIAGNOSIS — J0191 Acute recurrent sinusitis, unspecified: Secondary | ICD-10-CM

## 2016-02-17 DIAGNOSIS — Z13820 Encounter for screening for osteoporosis: Secondary | ICD-10-CM | POA: Diagnosis not present

## 2016-02-17 MED ORDER — LEVOFLOXACIN 500 MG PO TABS: 500.0000 mg | ORAL_TABLET | Freq: Every day | ORAL | 0 refills | Status: AC

## 2016-02-17 NOTE — Progress Notes (Signed)
Pre visit review using our clinic review tool, if applicable. No additional management support is needed unless otherwise documented below in the visit note. 

## 2016-02-17 NOTE — Progress Notes (Signed)
   Subjective:    Patient ID: Osborne OmanMyranda Goltz, female    DOB: 11/14/1975, 40 y.o.   MRN: [id number]  HPI Here for recurrent symptoms of sinus pressure, PND, headache, and ST. No cough. She took 10 days of Levaquin in June for a sinusitis and it helped a great deal.    Review of Systems  Constitutional: Negative.   HENT: Positive for congestion, postnasal drip, sinus pressure and sore throat.   Eyes: Negative.   Respiratory: Negative.        Objective:   Physical Exam  Constitutional: She appears well-developed and well-nourished.  HENT:  Right Ear: External ear normal.  Left Ear: External ear normal.  Nose: Nose normal.  Mouth/Throat: Oropharynx is clear and moist.  Eyes: Conjunctivae are normal.  Neck: No thyromegaly present.  Pulmonary/Chest: Effort normal and breath sounds normal.  Lymphadenopathy:    She has no cervical adenopathy.          Assessment & Plan:  Sinusitis, treat with 14 days of Levaquin.  Nelwyn SalisburyFRY,[name], MD

## 2016-02-20 ENCOUNTER — Encounter: Payer: Self-pay | Admitting: Family Medicine

## 2016-02-21 DIAGNOSIS — M545 Low back pain: Secondary | ICD-10-CM | POA: Diagnosis not present

## 2016-02-21 DIAGNOSIS — M6281 Muscle weakness (generalized): Secondary | ICD-10-CM | POA: Diagnosis not present

## 2016-02-21 DIAGNOSIS — M6249 Contracture of muscle, multiple sites: Secondary | ICD-10-CM | POA: Diagnosis not present

## 2016-02-21 DIAGNOSIS — R202 Paresthesia of skin: Secondary | ICD-10-CM | POA: Diagnosis not present

## 2016-02-21 NOTE — Telephone Encounter (Signed)
Tell her that she can simply stop the Zoloft all at once. As for another medication to try, I really don't know what else to do. I suggest she see a Psychiatrist to help her find the right one for her

## 2016-02-21 NOTE — Telephone Encounter (Signed)
I spoke with pt and went over below information. She will consult with Psychiatrist.

## 2016-02-24 ENCOUNTER — Encounter: Payer: Self-pay | Admitting: Family Medicine

## 2016-02-24 NOTE — Telephone Encounter (Signed)
Getting the name brand would make no difference because it is the exact same chemical as generic.

## 2016-03-01 ENCOUNTER — Other Ambulatory Visit: Payer: Self-pay | Admitting: Family Medicine

## 2016-03-02 DIAGNOSIS — M6281 Muscle weakness (generalized): Secondary | ICD-10-CM | POA: Diagnosis not present

## 2016-03-02 DIAGNOSIS — M6249 Contracture of muscle, multiple sites: Secondary | ICD-10-CM | POA: Diagnosis not present

## 2016-03-02 DIAGNOSIS — M545 Low back pain: Secondary | ICD-10-CM | POA: Diagnosis not present

## 2016-03-02 DIAGNOSIS — R202 Paresthesia of skin: Secondary | ICD-10-CM | POA: Diagnosis not present

## 2016-03-04 DIAGNOSIS — R030 Elevated blood-pressure reading, without diagnosis of hypertension: Secondary | ICD-10-CM | POA: Diagnosis not present

## 2016-03-04 DIAGNOSIS — M47816 Spondylosis without myelopathy or radiculopathy, lumbar region: Secondary | ICD-10-CM | POA: Diagnosis not present

## 2016-03-05 ENCOUNTER — Ambulatory Visit (INDEPENDENT_AMBULATORY_CARE_PROVIDER_SITE_OTHER): Payer: BLUE CROSS/BLUE SHIELD | Admitting: Licensed Clinical Social Worker

## 2016-03-05 DIAGNOSIS — F331 Major depressive disorder, recurrent, moderate: Secondary | ICD-10-CM

## 2016-03-09 DIAGNOSIS — M6249 Contracture of muscle, multiple sites: Secondary | ICD-10-CM | POA: Diagnosis not present

## 2016-03-09 DIAGNOSIS — M6281 Muscle weakness (generalized): Secondary | ICD-10-CM | POA: Diagnosis not present

## 2016-03-09 DIAGNOSIS — M545 Low back pain: Secondary | ICD-10-CM | POA: Diagnosis not present

## 2016-03-09 DIAGNOSIS — R202 Paresthesia of skin: Secondary | ICD-10-CM | POA: Diagnosis not present

## 2016-03-11 DIAGNOSIS — M6281 Muscle weakness (generalized): Secondary | ICD-10-CM | POA: Diagnosis not present

## 2016-03-11 DIAGNOSIS — R202 Paresthesia of skin: Secondary | ICD-10-CM | POA: Diagnosis not present

## 2016-03-11 DIAGNOSIS — M6249 Contracture of muscle, multiple sites: Secondary | ICD-10-CM | POA: Diagnosis not present

## 2016-03-11 DIAGNOSIS — M545 Low back pain: Secondary | ICD-10-CM | POA: Diagnosis not present

## 2016-03-17 ENCOUNTER — Encounter: Payer: Self-pay | Admitting: Family Medicine

## 2016-03-17 DIAGNOSIS — M5137 Other intervertebral disc degeneration, lumbosacral region: Secondary | ICD-10-CM | POA: Diagnosis not present

## 2016-03-18 NOTE — Telephone Encounter (Signed)
I am fine with trying Effexor XR. Call in 75 mg to take once a day, #30 with 2 rf.

## 2016-03-20 ENCOUNTER — Ambulatory Visit (INDEPENDENT_AMBULATORY_CARE_PROVIDER_SITE_OTHER): Payer: BLUE CROSS/BLUE SHIELD | Admitting: Licensed Clinical Social Worker

## 2016-03-20 ENCOUNTER — Telehealth: Payer: Self-pay | Admitting: Family Medicine

## 2016-03-20 DIAGNOSIS — F331 Major depressive disorder, recurrent, moderate: Secondary | ICD-10-CM

## 2016-03-20 MED ORDER — VENLAFAXINE HCL ER 75 MG PO CP24: 75.0000 mg | ORAL_CAPSULE | Freq: Every day | ORAL | 2 refills | Status: DC

## 2016-03-20 NOTE — Telephone Encounter (Signed)
Pt scheduled  

## 2016-03-20 NOTE — Telephone Encounter (Signed)
I spoke with pt, sent new script e[facility]scribe to [facility] and updated medication list.

## 2016-03-20 NOTE — Telephone Encounter (Signed)
Can you call pt to schedule a early morning CPE, can put 2 same days together if needed?

## 2016-03-24 ENCOUNTER — Encounter: Payer: Self-pay | Admitting: Family Medicine

## 2016-03-24 ENCOUNTER — Other Ambulatory Visit (INDEPENDENT_AMBULATORY_CARE_PROVIDER_SITE_OTHER): Payer: BLUE CROSS/BLUE SHIELD

## 2016-03-24 ENCOUNTER — Ambulatory Visit (INDEPENDENT_AMBULATORY_CARE_PROVIDER_SITE_OTHER): Payer: BLUE CROSS/BLUE SHIELD | Admitting: Family Medicine

## 2016-03-24 VITALS — BP 122/84 | Temp 98.2°F | Ht 69.5 in | Wt 268.0 lb

## 2016-03-24 DIAGNOSIS — E559 Vitamin D deficiency, unspecified: Secondary | ICD-10-CM

## 2016-03-24 DIAGNOSIS — Z0000 Encounter for general adult medical examination without abnormal findings: Secondary | ICD-10-CM

## 2016-03-24 LAB — POC URINALSYSI DIPSTICK (AUTOMATED)
Bilirubin, UA: NEGATIVE
Blood, UA: NEGATIVE
Glucose, UA: NEGATIVE
Ketones, UA: NEGATIVE
Leukocytes, UA: NEGATIVE
Nitrite, UA: NEGATIVE
Protein, UA: NEGATIVE
Spec Grav, UA: 1.02
Urobilinogen, UA: 0.2
pH, UA: 6

## 2016-03-24 LAB — BASIC METABOLIC PANEL
BUN: 19 mg/dL (ref 6–23)
CO2: 27 mEq/L (ref 19–32)
Calcium: 9.1 mg/dL (ref 8.4–10.5)
Chloride: 103 mEq/L (ref 96–112)
Creatinine, Ser: 0.85 mg/dL (ref 0.40–1.20)
GFR: 78.06 mL/min (ref >60.00)
Glucose, Bld: 106 mg/dL — ABNORMAL HIGH (ref 70–99)
Potassium: 3.4 mEq/L — ABNORMAL LOW (ref 3.5–5.1)
Sodium: 140 mEq/L (ref 135–145)

## 2016-03-24 LAB — CBC WITH DIFFERENTIAL/PLATELET
Basophils Absolute: 0 10*3/uL (ref 0.0–0.1)
Basophils Relative: 0.4 % (ref 0.0–3.0)
Eosinophils Absolute: 0.2 10*3/uL (ref 0.0–0.7)
Eosinophils Relative: 1.8 % (ref 0.0–5.0)
HCT: 43.6 % (ref 36.0–46.0)
Hemoglobin: 14.7 g/dL (ref 12.0–15.0)
Lymphocytes Relative: 40.3 % (ref 12.0–46.0)
Lymphs Abs: 4.1 10*3/uL — ABNORMAL HIGH (ref 0.7–4.0)
MCHC: 33.7 g/dL (ref 30.0–36.0)
MCV: 88 fl (ref 78.0–100.0)
Monocytes Absolute: 0.6 10*3/uL (ref 0.1–1.0)
Monocytes Relative: 5.5 % (ref 3.0–12.0)
Neutro Abs: 5.3 10*3/uL (ref 1.4–7.7)
Neutrophils Relative %: 52 % (ref 43.0–77.0)
Platelets: 347 10*3/uL (ref 150.0–400.0)
RBC: 4.96 Mil/uL (ref 3.87–5.11)
RDW: 13.6 % (ref 11.5–15.5)
WBC: 10.1 10*3/uL (ref 4.0–10.5)

## 2016-03-24 LAB — LIPID PANEL
Cholesterol: 248 mg/dL — ABNORMAL HIGH (ref 0–200)
HDL: 38.5 mg/dL — ABNORMAL LOW (ref >39.00)
LDL Cholesterol: 170 mg/dL — ABNORMAL HIGH (ref 0–99)
NonHDL: 209.52
Total CHOL/HDL Ratio: 6
Triglycerides: 196 mg/dL — ABNORMAL HIGH (ref 0.0–149.0)
VLDL: 39.2 mg/dL (ref 0.0–40.0)

## 2016-03-24 LAB — HEPATIC FUNCTION PANEL
ALT: 28 U/L (ref 0–35)
AST: 24 U/L (ref 0–37)
Albumin: 4.2 g/dL (ref 3.5–5.2)
Alkaline Phosphatase: 58 U/L (ref 39–117)
Bilirubin, Direct: 0 mg/dL (ref 0.0–0.3)
Total Bilirubin: 0.4 mg/dL (ref 0.2–1.2)
Total Protein: 7.3 g/dL (ref 6.0–8.3)

## 2016-03-24 LAB — VITAMIN D 25 HYDROXY (VIT D DEFICIENCY, FRACTURES): VITD: 13.23 ng/mL — ABNORMAL LOW (ref 30.00–100.00)

## 2016-03-24 LAB — TSH: TSH: 2.14 u[IU]/mL (ref 0.35–4.50)

## 2016-03-24 LAB — VITAMIN B12: Vitamin B-12: 1136 pg/mL — ABNORMAL HIGH (ref 211–911)

## 2016-03-24 NOTE — Telephone Encounter (Signed)
I put in the orders and these will be run

## 2016-03-24 NOTE — Progress Notes (Signed)
Pre visit review using our clinic review tool, if applicable. No additional management support is needed unless otherwise documented below in the visit note. 

## 2016-03-24 NOTE — Progress Notes (Signed)
   Subjective:    Patient ID: [name], female    DOB: 12/04/1975, 40 y.o.   MRN: [id number]  HPI 40 yr old female for a well exam. Her main concerns are her chronic low back pain and her depression/anxiety. She does pool therapy twice a week for her back. She sees Dr. Berniece AndreasJulie Whitt for psychotherapy. She started on Effexor XR last weekend, and we are hopeful this will help. She sees her GYN regularly.    Review of Systems  Constitutional: Negative.   HENT: Negative.   Eyes: Negative.   Respiratory: Negative.   Cardiovascular: Negative.   Gastrointestinal: Negative.   Genitourinary: Negative for decreased urine volume, difficulty urinating, dyspareunia, dysuria, enuresis, flank pain, frequency, hematuria, pelvic pain and urgency.  Musculoskeletal: Positive for back pain. Negative for arthralgias, gait problem, joint swelling, myalgias, neck pain and neck stiffness.  Skin: Negative.   Neurological: Negative.   Psychiatric/Behavioral: Positive for dysphoric mood. Negative for agitation, behavioral problems, confusion, decreased concentration, hallucinations, self-injury, sleep disturbance and suicidal ideas. The patient is nervous/anxious. The patient is not hyperactive.        Objective:   Physical Exam  Constitutional: She is oriented to person, place, and time. She appears well-developed and well-nourished. No distress.  HENT:  Head: Normocephalic and atraumatic.  Right Ear: External ear normal.  Left Ear: External ear normal.  Nose: Nose normal.  Mouth/Throat: Oropharynx is clear and moist. No oropharyngeal exudate.  Eyes: Conjunctivae and EOM are normal. Pupils are equal, round, and reactive to light. No scleral icterus.  Neck: Normal range of motion. Neck supple. No JVD present. No thyromegaly present.  Cardiovascular: Normal rate, regular rhythm, normal heart sounds and intact distal pulses.  Exam reveals no gallop and no friction rub.   No murmur heard. Pulmonary/Chest:  Effort normal and breath sounds normal. No respiratory distress. She has no wheezes. She has no rales. She exhibits no tenderness.  Abdominal: Soft. Bowel sounds are normal. She exhibits no distension and no mass. There is no tenderness. There is no rebound and no guarding.  Musculoskeletal: Normal range of motion. She exhibits no edema or tenderness.  Lymphadenopathy:    She has no cervical adenopathy.  Neurological: She is alert and oriented to person, place, and time. She has normal reflexes. No cranial nerve deficit. She exhibits normal muscle tone. Coordination normal.  Skin: Skin is warm and dry. No rash noted. No erythema.  Psychiatric: She has a normal mood and affect. Her behavior is normal. Judgment and thought content normal.          Assessment & Plan:  Well exam. We discussed diet and exercise. We will get fasting labs today. Hopefully the Effexor XR will help her depression and anxiety, but it may take another 7-10 days for this to kick in.  Nelwyn SalisburyFRY,Xzayvier Fagin A, MD  .

## 2016-03-24 NOTE — Addendum Note (Signed)
Addended by: Gershon Crane A on: 03/24/2016 02:12 PM   Modules accepted: Orders

## 2016-03-25 ENCOUNTER — Encounter: Payer: Self-pay | Admitting: Family Medicine

## 2016-03-25 DIAGNOSIS — M6281 Muscle weakness (generalized): Secondary | ICD-10-CM | POA: Diagnosis not present

## 2016-03-25 DIAGNOSIS — R202 Paresthesia of skin: Secondary | ICD-10-CM | POA: Diagnosis not present

## 2016-03-25 DIAGNOSIS — M6249 Contracture of muscle, multiple sites: Secondary | ICD-10-CM | POA: Diagnosis not present

## 2016-03-25 DIAGNOSIS — M545 Low back pain: Secondary | ICD-10-CM | POA: Diagnosis not present

## 2016-03-26 ENCOUNTER — Other Ambulatory Visit: Payer: Self-pay | Admitting: Family Medicine

## 2016-03-26 MED ORDER — VITAMIN D (ERGOCALCIFEROL) 1.25 MG (50000 UNIT) PO CAPS: 50000.0000 [IU] | ORAL_CAPSULE | ORAL | 3 refills | Status: DC

## 2016-03-26 MED ORDER — POTASSIUM CHLORIDE ER 10 MEQ PO TBCR: 10.0000 meq | EXTENDED_RELEASE_TABLET | Freq: Every day | ORAL | 3 refills | Status: DC

## 2016-03-26 NOTE — Telephone Encounter (Signed)
Yes I think getting on a lipid lowering medication is a good idea. Call in Lipitor 20 mg daily, #90 with 3 rf. Recheck lipids and a liver panel in 90 days

## 2016-03-27 ENCOUNTER — Other Ambulatory Visit: Payer: Self-pay | Admitting: Family Medicine

## 2016-03-27 MED ORDER — ATORVASTATIN CALCIUM 20 MG PO TABS: 20.0000 mg | ORAL_TABLET | Freq: Every day | ORAL | 3 refills | Status: DC

## 2016-03-27 NOTE — Telephone Encounter (Signed)
I spoke with pt, [facility]. 

## 2016-04-07 DIAGNOSIS — M2241 Chondromalacia patellae, right knee: Secondary | ICD-10-CM | POA: Diagnosis not present

## 2016-04-09 ENCOUNTER — Encounter: Payer: Self-pay | Admitting: Family Medicine

## 2016-04-09 NOTE — Telephone Encounter (Signed)
Advise her to wean off this because it can make you feel bad if you stop suddenly. Take a pill every other day for one week and then stop it

## 2016-04-10 ENCOUNTER — Ambulatory Visit: Payer: BLUE CROSS/BLUE SHIELD | Admitting: Licensed Clinical Social Worker

## 2016-04-10 NOTE — Telephone Encounter (Signed)
I answered this yesterday.

## 2016-04-27 ENCOUNTER — Encounter: Payer: Self-pay | Admitting: Family Medicine

## 2016-04-28 ENCOUNTER — Ambulatory Visit (INDEPENDENT_AMBULATORY_CARE_PROVIDER_SITE_OTHER): Payer: BLUE CROSS/BLUE SHIELD | Admitting: Licensed Clinical Social Worker

## 2016-04-28 DIAGNOSIS — F331 Major depressive disorder, recurrent, moderate: Secondary | ICD-10-CM | POA: Diagnosis not present

## 2016-04-30 ENCOUNTER — Encounter: Payer: Self-pay | Admitting: Gastroenterology

## 2016-04-30 ENCOUNTER — Ambulatory Visit (INDEPENDENT_AMBULATORY_CARE_PROVIDER_SITE_OTHER): Payer: BLUE CROSS/BLUE SHIELD | Admitting: Gastroenterology

## 2016-04-30 VITALS — BP 108/90 | HR 80 | Ht 69.5 in | Wt 264.2 lb

## 2016-04-30 DIAGNOSIS — R0989 Other specified symptoms and signs involving the circulatory and respiratory systems: Secondary | ICD-10-CM

## 2016-04-30 DIAGNOSIS — K219 Gastro-esophageal reflux disease without esophagitis: Secondary | ICD-10-CM | POA: Diagnosis not present

## 2016-04-30 DIAGNOSIS — Z6839 Body mass index (BMI) 39.0-39.9, adult: Secondary | ICD-10-CM

## 2016-04-30 DIAGNOSIS — R0789 Other chest pain: Secondary | ICD-10-CM | POA: Diagnosis not present

## 2016-04-30 DIAGNOSIS — R109 Unspecified abdominal pain: Secondary | ICD-10-CM

## 2016-04-30 DIAGNOSIS — E669 Obesity, unspecified: Secondary | ICD-10-CM

## 2016-04-30 DIAGNOSIS — K760 Fatty (change of) liver, not elsewhere classified: Secondary | ICD-10-CM

## 2016-04-30 DIAGNOSIS — R198 Other specified symptoms and signs involving the digestive system and abdomen: Secondary | ICD-10-CM

## 2016-04-30 DIAGNOSIS — F458 Other somatoform disorders: Secondary | ICD-10-CM

## 2016-04-30 NOTE — Patient Instructions (Signed)
You have been scheduled for an esophageal manometry at [facility] on      at        . Please arrive 30 minutes prior to your procedure for registration. You will need to go to outpatient registration (1st floor of the hospital) first. Make certain to bring your insurance cards as well as a complete list of medications.  Please remember the following:  1) Do not take any muscle relaxants, xanax (alprazolam) or ativan for 1 day prior to your test as well as the day of the test.  2) Nothing to eat or drink after 12:00 midnight on the night before your test.  3) Hold all diabetic medications/insulin the morning of the test. You may eat and take your medications after the test.  It will take at least 2 weeks to receive the results of this test from your physician. ------------------------------------------ ABOUT ESOPHAGEAL MANOMETRY Esophageal manometry (muh-NOM-uh-tree) is a test that gauges how well your esophagus works. Your esophagus is the long, muscular tube that connects your throat to your stomach. Esophageal manometry measures the rhythmic muscle contractions (peristalsis) that occur in your esophagus when you swallow. Esophageal manometry also measures the coordination and force exerted by the muscles of your esophagus.  During esophageal manometry, a thin, flexible tube (catheter) that contains sensors is passed through your nose, down your esophagus and into your stomach. Esophageal manometry can be helpful in diagnosing some mostly uncommon disorders that affect your esophagus.  Why it's done Esophageal manometry is used to evaluate the movement (motility) of food through the esophagus and into the stomach. The test measures how well the circular bands of muscle (sphincters) at the top and bottom of your esophagus open and close, as well as the pressure, strength and pattern of the wave of esophageal muscle contractions that moves food along.  What you can expect Esophageal  manometry is an outpatient procedure done without sedation. Most people tolerate it well. You may be asked to change into a hospital gown before the test starts.  During esophageal manometry  While you are sitting up, a member of your health care team sprays your throat with a numbing medication or puts numbing gel in your nose or both.  A catheter is guided through your nose into your esophagus. The catheter may be sheathed in a water-filled sleeve. It doesn't interfere with your breathing. However, your eyes may water, and you may gag. You may have a slight nosebleed from irritation.  After the catheter is in place, you may be asked to lie on your back on an exam table, or you may be asked to remain seated.  You then swallow small sips of water. As you do, a computer connected to the catheter records the pressure, strength and pattern of your esophageal muscle contractions.  During the test, you'll be asked to breathe slowly and smoothly, remain as still as possible, and swallow only when you're asked to do so.  A member of your health care team may move the catheter down into your stomach while the catheter continues its measurements.  The catheter then is slowly withdrawn. The test usually lasts 20 to 30 minutes.  After esophageal manometry  When your esophageal manometry is complete, you may return to your normal activities  This test typically takes 30-45 minutes to complete. ________________________________________________________________________________   Gaviscon.  As needed.  Capcaisin cream. As needed  Weight Loss- We will refer you to a nutritionist.

## 2016-04-30 NOTE — Progress Notes (Signed)
HPI :  40 year old female with a history of reflux and abdominal pain here for a follow-up visit.  Since the last visit she had an EGD 09/04/15 - esophagus normal. Mild gastritis, benign gastric polyps, normal duodenum  US on 09/03/15 showed diffuse hepatic steatosis and gallstones. She subsequently was seen by surgery and had cholecystectomy in May.   She reports she had relief of some of her severe upper symptoms with cholecysectomy, but still having some other symptoms which bother her which have changed. She reports some chronic epigastric discomfort since the surgery. She has extreme sensitivity to the surgical scar and epigastric area to the touch and tender. She thinks ongoing since the cholecystectomy. She endorses pain present 24/7 and present most of the time.   She has some globus otherwise which bothers her in her mid chest and sternal notch.She denies dysphagia to solids. She denies liquids. Rarely rice can cause mild dysphagia. She denies much heartburn. She has occasional regurgitation. She has intermittent chest discomfort which can come and go, often when she lies in her bed when sleeping. She is taking protonix 40mg  BID, and taking carafate liquid form PRN which can help. She takes gabapentin for chronic leg pain, secondary to lower back pain.   Mother had cirrhosis. She denies alcohol use. She has not seen a nutritionist for weight loss in the past. Hard time exercising due to chronic pains. BMI 38.    Past Medical History:  Diagnosis Date  . Allergy   . Anxiety   . B12 deficiency   . Depression    not currently   . Dysrhythmia    hx tachy-was from medication nortriptylline  . Esophagitis    rx  . Fibromyalgia   . GERD (gastroesophageal reflux disease)   . History of kidney stones   . Hyperlipidemia   . Intervertebral disc protrusion 01/11/2014  . Migraine   . Peripheral neuropathy (HCC)   . Polycystic ovaries      Past Surgical History:  Procedure Laterality  Date  . APPENDECTOMY    . BACK SURGERY  2012   left laminectomy at L3-4 per Dr. Phoebe PerchHirsch   . bladder tack  2012  . CHOLECYSTECTOMY N/A 10/03/2015   Procedure: LAPAROSCOPIC CHOLECYSTECTOMY;  Surgeon: Rodman PickleLuke Aaron Kinsinger, MD;  [facility];  Service: General;  Laterality: N/A;  . EXCISION OF SKIN TAG  10/03/2015   Procedure: EXCISION OF SKIN TAG;  Surgeon: De BlanchLuke Aaron Kinsinger, MD;  [facility];  Service: General;;  . LUMBAR DISC SURGERY  01-31-14   per Dr. Wynetta Emeryram   . OOPHORECTOMY     left overy  . OVARIAN CYST REMOVAL    . RECTOCELE REPAIR    . SINUSOTOMY  12/14/06  . spinal fusion  2016/10  . TONSILLECTOMY    . TONSILLECTOMY    . VAGINAL HYSTERECTOMY    . WRIST GANGLION EXCISION Left    Family History  Problem Relation Age of Onset  . Fibromyalgia Mother   . Diabetes Mother   . Thyroid cancer Mother   . Liver disease Mother   . Neuropathy Mother   . Cirrhosis Mother     non-alcoholic  . Pulmonary embolism Mother     both lungs  . Hypertension Father   . Diabetes Father   . Heart disease Father   . Prostate cancer Father   . Heart attack Father     x 2  . Colon cancer Neg Hx   . Other Neg Hx  pheochromocytoma  . Colon polyps Neg Hx    Social History  Substance Use Topics  . Smoking status: Former Smoker    Types: Cigarettes    Quit date: 06/01/1998  . Smokeless tobacco: Never Used  . Alcohol use No   Current Outpatient Prescriptions  Medication Sig Dispense Refill  . ALPRAZolam (XANAX) 0.5 MG tablet Take 1 tablet (0.5 mg total) by mouth 3 (three) times daily as needed for anxiety. 90 tablet 5  . atorvastatin (LIPITOR) 20 MG tablet Take 1 tablet (20 mg total) by mouth daily. 90 tablet 3  . BD INTEGRA SYRINGE 25G X 1" 3 ML MISC USE AS DIRECTED 50 each 1  . calcium carbonate (TUMS - DOSED IN MG ELEMENTAL CALCIUM) 500 MG chewable tablet Chew 1 tablet by mouth 2 (two) times daily as needed for indigestion or heartburn.    . Cholecalciferol (VITAMIN D3) 2000 UNITS  TABS Take 2,000 Units by mouth daily.     . cyanocobalamin (,VITAMIN B-12,) 1000 MCG/ML injection INJECT 1 ML (1,000 MCG TOTAL) INTO THE MUSCLE EVERY 7 DAYS. 12 mL 3  . fluconazole (DIFLUCAN) 150 MG tablet TAKE 1 TABLET (150 MG TOTAL) BY MOUTH DAILY. 5 tablet 5  . fluticasone (FLONASE) 50 MCG/ACT nasal spray Place 2 sprays into both nostrils daily as needed for allergies.     Marland Kitchen. gabapentin (NEURONTIN) 100 MG capsule Take 100-200 mg by mouth 3 (three) times daily as needed.   0  . gabapentin (NEURONTIN) 300 MG capsule TAKE ONE CAPSULE BY MOUTH 3 TIMES A DAY 90 capsule 5  . hydrochlorothiazide (HYDRODIURIL) 25 MG tablet TAKE 1 TABLET BY MOUTH EVERY DAY 90 tablet 3  . Multiple Vitamins-Minerals (MULTI-VITAMIN GUMMIES PO) Take 2 tablets by mouth daily.     Marland Kitchen. oxyCODONE-acetaminophen (PERCOCET) 10-325 MG per tablet Take 1 tablet by mouth every 6 (six) hours as needed for pain. 60 tablet 0  . pantoprazole (PROTONIX) 40 MG tablet TAKE 1 TABLET BY MOUTH TWICE A DAY 180 tablet 1  . potassium chloride (KLOR-CON 10) 10 MEQ tablet Take 1 tablet (10 mEq total) by mouth daily. 90 tablet 3  . promethazine (PHENERGAN) 25 MG tablet TAKE 1 TABLET BY MOUTH EVERY 6 HOURS AS NEEDED FOR NAUSEA 90 tablet 3  . simethicone (MYLICON) 80 MG chewable tablet Chew 160 mg by mouth every 6 (six) hours as needed (gas pain).    . sucralfate (CARAFATE) 1 GM/10ML suspension Take 10 mLs (1 g total) by mouth 4 (four) times daily -  with meals and at bedtime. 420 mL 2  . SUMAtriptan (IMITREX) 50 MG tablet TAKE 1 TABLET BY MOUTH AT ONSET OF HEADACHE MAY REPEAT ONCE IN 2 HOURS IF PERSISTS OR RECURS (Patient taking differently: TAKE 150 MG BY MOUTH AT ONSET OF HEADACHE MAY REPEAT ONCE IN 2 HOURS IF PERSISTS OR RECURS) 9 tablet 2  . Vitamin D, Ergocalciferol, (DRISDOL) 50000 units CAPS capsule Take 1 capsule (50,000 Units total) by mouth every 7 (seven) days. 12 capsule 3   No current facility-administered medications for this visit.     Allergies  Allergen Reactions  . Gadolinium Derivatives Swelling and Other (See Comments)    Arm swelling, tingling, & redness. Radiologist Dr. Grace IsaacWatts recommends an injection of steroids if patient needs a gadolinium based contrast in the future.  Marland Kitchen. Zoloft [Sertraline Hcl]     Hair loss     Review of Systems: All systems reviewed and negative except where noted in HPI.   Lab Results  Component Value Date   WBC 10.1 03/24/2016   HGB 14.7 03/24/2016   HCT 43.6 03/24/2016   MCV 88.0 03/24/2016   PLT 347.0 03/24/2016    Lab Results  Component Value Date   CREATININE 0.85 03/24/2016   BUN 19 03/24/2016   NA 140 03/24/2016   K 3.4 (L) 03/24/2016   CL 103 03/24/2016   CO2 27 03/24/2016    Lab Results  Component Value Date   ALT 28 03/24/2016   AST 24 03/24/2016   ALKPHOS 58 03/24/2016   BILITOT 0.4 03/24/2016      Physical Exam: BP 108/90   Pulse 80   Ht 5' 9.5" (1.765 m)   Wt 264 lb 4 oz (119.9 kg)   LMP 12/13/1999   BMI 38.46 kg/m  Constitutional: Pleasant,well-developed, female in no acute distress. HEENT: Normocephalic and atraumatic. Conjunctivae are normal. No scleral icterus. Neck supple.  Cardiovascular: Normal rate, regular rhythm.  Pulmonary/chest: Effort normal and breath sounds normal. No wheezing, rales or rhonchi. Abdominal: Soft, protuberant epigastric surgical scar (+) Carnett sign.  There are no masses palpable. No hepatomegaly. Extremities: no edema Lymphadenopathy: No cervical adenopathy noted. Neurological: Alert and oriented to person place and time. Skin: Skin is warm and dry. No rashes noted. Psychiatric: Normal mood and affect. Behavior is normal.   ASSESSMENT AND PLAN: 40 year old female here for reassessment of the following issues:  Abdominal wall pain - status post cholecystectomy, pain along surgical incision and epigastric area, chronic pain 24/7, positive Carnett sign. We'll treat initially with topical capsaicin cream a few  times daily. She is already on gabapentin. If symptoms fail to improve will refer for trigger point injection.  Atypical chest pain / GERD / globus - prior EGD without abnormality. Symptoms persist on Protonix twice daily, although she does get some relief with Carafate. She could be having nonacid reflux versus esophageal spasm or esophageal hypersensitivity. Recommend esophageal manometry along with 24-hour pH impedance testing on PPI to further evaluate. She agreed, further recommendations with results.  Fatty liver - normal LFTs. I counseled her on fatty liver in general and long-term risks of Elita Boone and cirrhosis. She should have her liver functions checked at least once yearly. Recommend diet and exercise for weight loss, as well as routine coffee intake. I'll refer her to a nutritionist for further advice on diet.  Obesity - we discussed diet and referral to nutritionist. If she truly can't lose weight with dieting and should consider other medical therapy and perhaps bariatric surgery.   Ileene Patrick, MD [facility] Pager [phone]

## 2016-04-30 NOTE — Telephone Encounter (Signed)
That would be okay to do. Call in Phentermine 37.5 mg daily, #30 with 5 rf

## 2016-05-01 ENCOUNTER — Other Ambulatory Visit: Payer: Self-pay

## 2016-05-01 ENCOUNTER — Telehealth: Payer: Self-pay

## 2016-05-01 DIAGNOSIS — E669 Obesity, unspecified: Secondary | ICD-10-CM

## 2016-05-01 NOTE — Telephone Encounter (Signed)
Pt is scheduled for 24h ph study with manometry 05/13/16 at 8:30am. [facility] at 8[facility] for testing.

## 2016-05-05 ENCOUNTER — Other Ambulatory Visit: Payer: Self-pay | Admitting: Family Medicine

## 2016-05-05 MED ORDER — PHENTERMINE HCL 37.5 MG PO CAPS: 37.5000 mg | ORAL_CAPSULE | ORAL | 5 refills | Status: DC

## 2016-05-12 ENCOUNTER — Telehealth: Payer: Self-pay | Admitting: Physician Assistant

## 2016-05-14 ENCOUNTER — Ambulatory Visit (INDEPENDENT_AMBULATORY_CARE_PROVIDER_SITE_OTHER): Payer: BLUE CROSS/BLUE SHIELD | Admitting: Licensed Clinical Social Worker

## 2016-05-14 DIAGNOSIS — F331 Major depressive disorder, recurrent, moderate: Secondary | ICD-10-CM

## 2016-05-21 ENCOUNTER — Other Ambulatory Visit: Payer: Self-pay | Admitting: Family Medicine

## 2016-05-27 ENCOUNTER — Encounter (HOSPITAL_COMMUNITY)
Admission: RE | Disposition: A | Discharge status: Home / Self Care | Payer: Self-pay | Source: Ambulatory Visit | Attending: Gastroenterology

## 2016-05-27 ENCOUNTER — Ambulatory Visit (HOSPITAL_COMMUNITY)
Admission: RE | Admit: 2016-05-27 | Discharge: 2016-05-27 | Disposition: A | Discharge status: Home / Self Care | Payer: BLUE CROSS/BLUE SHIELD | Source: Ambulatory Visit | Attending: Gastroenterology | Admitting: Gastroenterology

## 2016-05-27 ENCOUNTER — Encounter: Payer: Self-pay | Admitting: Gastroenterology

## 2016-05-27 DIAGNOSIS — R079 Chest pain, unspecified: Secondary | ICD-10-CM | POA: Insufficient documentation

## 2016-05-27 DIAGNOSIS — K219 Gastro-esophageal reflux disease without esophagitis: Secondary | ICD-10-CM

## 2016-05-27 DIAGNOSIS — R1310 Dysphagia, unspecified: Secondary | ICD-10-CM | POA: Diagnosis not present

## 2016-05-27 DIAGNOSIS — R0789 Other chest pain: Secondary | ICD-10-CM

## 2016-05-27 DIAGNOSIS — R12 Heartburn: Secondary | ICD-10-CM | POA: Insufficient documentation

## 2016-05-27 HISTORY — PX: 24 HOUR PH STUDY: SHX5419

## 2016-05-27 HISTORY — PX: ESOPHAGEAL MANOMETRY: SHX5429

## 2016-05-27 SURGERY — MONITORING, ESOPHAGEAL PH, 24 HOUR

## 2016-05-27 MED ORDER — LIDOCAINE VISCOUS 2 % MT SOLN
OROMUCOSAL | Status: AC
  Filled 2016-05-27: qty 15

## 2016-05-27 SURGICAL SUPPLY — 2 items
FACESHIELD LNG OPTICON STERILE (SAFETY) IMPLANT
GLOVE BIO SURGEON STRL SZ8 (GLOVE) ×4 IMPLANT

## 2016-05-27 NOTE — Progress Notes (Signed)
Manometry done 

## 2016-05-28 ENCOUNTER — Encounter (HOSPITAL_COMMUNITY): Payer: Self-pay | Admitting: Gastroenterology

## 2016-06-03 ENCOUNTER — Ambulatory Visit (INDEPENDENT_AMBULATORY_CARE_PROVIDER_SITE_OTHER): Payer: BLUE CROSS/BLUE SHIELD | Admitting: Licensed Clinical Social Worker

## 2016-06-03 DIAGNOSIS — F331 Major depressive disorder, recurrent, moderate: Secondary | ICD-10-CM

## 2016-06-04 ENCOUNTER — Encounter: Payer: Self-pay | Admitting: Family Medicine

## 2016-06-08 ENCOUNTER — Telehealth: Payer: Self-pay | Admitting: Gastroenterology

## 2016-06-08 ENCOUNTER — Other Ambulatory Visit: Payer: Self-pay | Admitting: Family Medicine

## 2016-06-08 MED ORDER — ROSUVASTATIN CALCIUM 10 MG PO TABS: 10.0000 mg | ORAL_TABLET | Freq: Every day | ORAL | 2 refills | Status: DC

## 2016-06-08 NOTE — Telephone Encounter (Signed)
Call in #90 with 5 rf 

## 2016-06-08 NOTE — Telephone Encounter (Signed)
Stop the Lipitor and call in Crestor 10 mg daily, #30 with 2 rf

## 2016-06-08 NOTE — Telephone Encounter (Signed)
Patient underwent esophageal manometry and 24 HR pH impedance testing while on PPI for evaluation of her symptoms.   PH testing on PPI shows no pathologic reflux - Demeester score is 8, indicating good control of reflux. Otherwise, symptom index was 0, none of her symptoms correlated with reflux.  I am awaiting the results of her manometry, once I have that back the patient will be contacted with the results of both of these exams with further recommendations

## 2016-06-09 NOTE — Telephone Encounter (Signed)
Rx was called in to [facility], patient was called and left a message to return a call at the clinic.

## 2016-06-16 DIAGNOSIS — M5137 Other intervertebral disc degeneration, lumbosacral region: Secondary | ICD-10-CM | POA: Diagnosis not present

## 2016-06-18 ENCOUNTER — Ambulatory Visit: Payer: BLUE CROSS/BLUE SHIELD | Admitting: Licensed Clinical Social Worker

## 2016-06-24 DIAGNOSIS — R0789 Other chest pain: Secondary | ICD-10-CM

## 2016-06-25 ENCOUNTER — Telehealth: Payer: Self-pay | Admitting: Gastroenterology

## 2016-06-25 NOTE — Telephone Encounter (Signed)
Esophageal manometry results returned, to be scanned into Epic.  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  Her EGD was normal in 08/2015. This could be related to some of her symptoms, and unclear if some of her narcotic use is related.   I will contact patient with results and discuss options. PH testing was normal.

## 2016-06-25 NOTE — Telephone Encounter (Signed)
Called and left a message, no answer. I will call back again later

## 2016-06-29 ENCOUNTER — Other Ambulatory Visit: Payer: Self-pay | Admitting: Gastroenterology

## 2016-06-29 DIAGNOSIS — R1310 Dysphagia, unspecified: Secondary | ICD-10-CM

## 2016-06-29 MED ORDER — DILTIAZEM HCL 60 MG PO TABS: 60.0000 mg | ORAL_TABLET | Freq: Two times a day (BID) | ORAL | 1 refills | Status: DC

## 2016-06-29 NOTE — Telephone Encounter (Signed)
Patient calling Dr.Armbruster back about results

## 2016-06-29 NOTE — Telephone Encounter (Signed)
Called patient and discussed results. She is taking percocet every 6 hours for chronic pain, could be causing or contributing to manometry findings, which I suspect are causing her symptoms given her PH testing was normal and reflux did not correlate with symptoms. I offered her a trial of short acting dilatiazem to see if this will porovide any relief, will start 60mg  BID to see if she tolerates it and she can titrate up as needed. She can call with questions in the interim and follow up with me in a few months in the office.

## 2016-06-30 ENCOUNTER — Ambulatory Visit (INDEPENDENT_AMBULATORY_CARE_PROVIDER_SITE_OTHER): Payer: BLUE CROSS/BLUE SHIELD | Admitting: Licensed Clinical Social Worker

## 2016-06-30 DIAGNOSIS — F331 Major depressive disorder, recurrent, moderate: Secondary | ICD-10-CM | POA: Diagnosis not present

## 2016-07-22 ENCOUNTER — Ambulatory Visit (INDEPENDENT_AMBULATORY_CARE_PROVIDER_SITE_OTHER): Payer: BLUE CROSS/BLUE SHIELD | Admitting: Licensed Clinical Social Worker

## 2016-07-22 DIAGNOSIS — F331 Major depressive disorder, recurrent, moderate: Secondary | ICD-10-CM | POA: Diagnosis not present

## 2016-07-23 ENCOUNTER — Other Ambulatory Visit: Payer: Self-pay | Admitting: Family Medicine

## 2016-07-23 DIAGNOSIS — F321 Major depressive disorder, single episode, moderate: Secondary | ICD-10-CM | POA: Diagnosis not present

## 2016-07-23 DIAGNOSIS — M797 Fibromyalgia: Secondary | ICD-10-CM | POA: Diagnosis not present

## 2016-07-23 DIAGNOSIS — F423 Hoarding disorder: Secondary | ICD-10-CM | POA: Diagnosis not present

## 2016-07-23 DIAGNOSIS — F418 Other specified anxiety disorders: Secondary | ICD-10-CM | POA: Diagnosis not present

## 2016-07-23 DIAGNOSIS — G8929 Other chronic pain: Secondary | ICD-10-CM | POA: Diagnosis not present

## 2016-08-04 ENCOUNTER — Ambulatory Visit (INDEPENDENT_AMBULATORY_CARE_PROVIDER_SITE_OTHER): Payer: BLUE CROSS/BLUE SHIELD | Admitting: Family Medicine

## 2016-08-04 ENCOUNTER — Encounter: Payer: Self-pay | Admitting: Family Medicine

## 2016-08-04 ENCOUNTER — Ambulatory Visit: Payer: BLUE CROSS/BLUE SHIELD | Admitting: Licensed Clinical Social Worker

## 2016-08-04 VITALS — BP 124/88 | Temp 98.5°F | Ht 69.5 in | Wt 262.0 lb

## 2016-08-04 DIAGNOSIS — Z87440 Personal history of urinary (tract) infections: Secondary | ICD-10-CM | POA: Diagnosis not present

## 2016-08-04 DIAGNOSIS — F324 Major depressive disorder, single episode, in partial remission: Secondary | ICD-10-CM | POA: Diagnosis not present

## 2016-08-04 DIAGNOSIS — F411 Generalized anxiety disorder: Secondary | ICD-10-CM | POA: Diagnosis not present

## 2016-08-04 LAB — POC URINALSYSI DIPSTICK (AUTOMATED)
Bilirubin, UA: NEGATIVE
Blood, UA: NEGATIVE
Glucose, UA: NEGATIVE
Ketones, UA: NEGATIVE
Leukocytes, UA: NEGATIVE
Nitrite, UA: NEGATIVE
Spec Grav, UA: 1.025
Urobilinogen, UA: 0.2
pH, UA: 6

## 2016-08-04 MED ORDER — FLUOXETINE HCL 20 MG PO TABS: 20.0000 mg | ORAL_TABLET | Freq: Every day | ORAL | 3 refills | Status: DC

## 2016-08-04 NOTE — Progress Notes (Signed)
Pre visit review using our clinic review tool, if applicable. No additional management support is needed unless otherwise documented below in the visit note. 

## 2016-08-04 NOTE — Addendum Note (Signed)
Addended by: Aniceto BossNIMMONS, Devina Bezold A on: 08/04/2016 12:18 PM   Modules accepted: Orders

## 2016-08-04 NOTE — Progress Notes (Signed)
   Subjective:    Patient ID: [name], female    DOB: 12/13/1975, 41 y.o.   MRN: [id number]  HPI Here for several issues. First she wants to check if she has a UTI. She has a hx of recurrent UTIs and last winter her GYN gave her Macrobid to take a dose whenever she has intercourse. She had not started this yet when 3 weeks ago she felt urinary urgency and burning. She took the Macrobid BID for 5 days, and all the symptoms went away. However she has had some mild urgency again the past 3 days. No fever or back pain. Also she asks me to review some labs results she recently obtained. She sees a "wellness provider" sometimes to get non-traditional medcial advice, and they suggested she get some testing to see what her genetic markers would show to be the best medications for her to try. She has suffered from depression and anxiety for years, and she has tried a number of antidepressants such as Zoloft, Effexor, Wellbutrin, and Lexapro. Each of these gave her a variety of side effects. As I review the lab work she brings with her today, I see various medications listed as the most and the least likely to be effective for her.    Review of Systems  Constitutional: Negative.   Respiratory: Negative.   Cardiovascular: Negative.   Gastrointestinal: Negative.   Genitourinary: Positive for frequency and urgency. Negative for dysuria, flank pain and hematuria.  Psychiatric/Behavioral: Positive for decreased concentration and dysphoric mood. Negative for agitation, behavioral problems, confusion, hallucinations, self-injury, sleep disturbance and suicidal ideas. The patient is nervous/anxious. The patient is not hyperactive.        Objective:   Physical Exam  Constitutional: She is oriented to person, place, and time. She appears well-developed and well-nourished.  Cardiovascular: Normal rate, regular rhythm, normal heart sounds and intact distal pulses.   Pulmonary/Chest: Effort normal and breath  sounds normal.  Abdominal: Soft. Bowel sounds are normal. She exhibits no distension and no mass. There is no tenderness. There is no rebound and no guarding.  Neurological: She is alert and oriented to person, place, and time.  Psychiatric: She has a normal mood and affect. Her behavior is normal. Thought content normal.          Assessment & Plan:  Her urine sample is clear. We will send this to the lab for a culture. It appears that her UTI is gone, but we will treat according to the culture results. For the depression and anxiety, we found Prozac on the list of meds that are most likely to help her. Se will start on 20 mg daily and recheck in 2-3 weeks.  Gershon CraneStephen Abie Cheek, MD

## 2016-08-06 ENCOUNTER — Ambulatory Visit: Payer: BLUE CROSS/BLUE SHIELD | Admitting: Licensed Clinical Social Worker

## 2016-08-06 LAB — URINE CULTURE

## 2016-08-07 ENCOUNTER — Encounter: Payer: Self-pay | Admitting: Family Medicine

## 2016-08-10 NOTE — Telephone Encounter (Signed)
This means there was no infection at the time this sample was collected. Either she had one earlier and it is now gone, or else she never had one.

## 2016-08-15 ENCOUNTER — Emergency Department (HOSPITAL_BASED_OUTPATIENT_CLINIC_OR_DEPARTMENT_OTHER)
Admission: EM | Admit: 2016-08-15 | Discharge: 2016-08-15 | Disposition: A | Discharge status: Home / Self Care | Payer: BLUE CROSS/BLUE SHIELD | Attending: Emergency Medicine | Admitting: Emergency Medicine

## 2016-08-15 ENCOUNTER — Encounter (HOSPITAL_BASED_OUTPATIENT_CLINIC_OR_DEPARTMENT_OTHER): Payer: Self-pay | Admitting: *Deleted

## 2016-08-15 DIAGNOSIS — T50995A Adverse effect of other drugs, medicaments and biological substances, initial encounter: Secondary | ICD-10-CM | POA: Diagnosis not present

## 2016-08-15 DIAGNOSIS — T43225A Adverse effect of selective serotonin reuptake inhibitors, initial encounter: Secondary | ICD-10-CM | POA: Diagnosis not present

## 2016-08-15 DIAGNOSIS — Z79899 Other long term (current) drug therapy: Secondary | ICD-10-CM | POA: Insufficient documentation

## 2016-08-15 DIAGNOSIS — R51 Headache: Secondary | ICD-10-CM | POA: Diagnosis not present

## 2016-08-15 DIAGNOSIS — T50905A Adverse effect of unspecified drugs, medicaments and biological substances, initial encounter: Secondary | ICD-10-CM

## 2016-08-15 DIAGNOSIS — R251 Tremor, unspecified: Secondary | ICD-10-CM | POA: Diagnosis not present

## 2016-08-15 DIAGNOSIS — M791 Myalgia: Secondary | ICD-10-CM | POA: Diagnosis not present

## 2016-08-15 DIAGNOSIS — Z87891 Personal history of nicotine dependence: Secondary | ICD-10-CM | POA: Diagnosis not present

## 2016-08-15 DIAGNOSIS — I10 Essential (primary) hypertension: Secondary | ICD-10-CM | POA: Insufficient documentation

## 2016-08-15 DIAGNOSIS — R5383 Other fatigue: Secondary | ICD-10-CM | POA: Diagnosis not present

## 2016-08-15 DIAGNOSIS — Z0389 Encounter for observation for other suspected diseases and conditions ruled out: Secondary | ICD-10-CM | POA: Diagnosis not present

## 2016-08-15 LAB — CBC WITH DIFFERENTIAL/PLATELET
Basophils Absolute: 0 10*3/uL (ref 0.0–0.1)
Basophils Relative: 0 %
Eosinophils Absolute: 0.2 10*3/uL (ref 0.0–0.7)
Eosinophils Relative: 2 %
HCT: 43.8 % (ref 36.0–46.0)
Hemoglobin: 15.5 g/dL — ABNORMAL HIGH (ref 12.0–15.0)
Lymphocytes Relative: 41 %
Lymphs Abs: 4.6 10*3/uL — ABNORMAL HIGH (ref 0.7–4.0)
MCH: 30.2 pg (ref 26.0–34.0)
MCHC: 35.4 g/dL (ref 30.0–36.0)
MCV: 85.4 fL (ref 78.0–100.0)
Monocytes Absolute: 0.7 10*3/uL (ref 0.1–1.0)
Monocytes Relative: 6 %
Neutro Abs: 5.9 10*3/uL (ref 1.7–7.7)
Neutrophils Relative %: 51 %
Platelets: 334 10*3/uL (ref 150–400)
RBC: 5.13 MIL/uL — ABNORMAL HIGH (ref 3.87–5.11)
RDW: 12.7 % (ref 11.5–15.5)
WBC: 11.4 10*3/uL — ABNORMAL HIGH (ref 4.0–10.5)

## 2016-08-15 LAB — COMPREHENSIVE METABOLIC PANEL
ALT: 15 U/L (ref 14–54)
AST: 20 U/L (ref 15–41)
Albumin: 3.9 g/dL (ref 3.5–5.0)
Alkaline Phosphatase: 59 U/L (ref 38–126)
Anion gap: 10 (ref 5–15)
BUN: 18 mg/dL (ref 6–20)
CO2: 25 mmol/L (ref 22–32)
Calcium: 8.6 mg/dL — ABNORMAL LOW (ref 8.9–10.3)
Chloride: 102 mmol/L (ref 101–111)
Creatinine, Ser: 0.89 mg/dL (ref 0.44–1.00)
GFR calc Af Amer: >60 mL/min (ref >60)
GFR calc non Af Amer: >60 mL/min (ref >60)
Glucose, Bld: 111 mg/dL — ABNORMAL HIGH (ref 65–99)
Potassium: 3.2 mmol/L — ABNORMAL LOW (ref 3.5–5.1)
Sodium: 137 mmol/L (ref 135–145)
Total Bilirubin: 0.6 mg/dL (ref 0.3–1.2)
Total Protein: 7.4 g/dL (ref 6.5–8.1)

## 2016-08-15 LAB — CK: Total CK: 85 U/L (ref 38–234)

## 2016-08-15 MED ORDER — SODIUM CHLORIDE 0.9 % IV BOLUS (SEPSIS)
1000.0000 mL | Freq: Once | INTRAVENOUS | Status: AC
  Administered 2016-08-15: 1000 mL via INTRAVENOUS

## 2016-08-15 MED ORDER — SODIUM CHLORIDE 0.9 % IV SOLN
INTRAVENOUS | Status: DC
  Administered 2016-08-15: 11:00:00 via INTRAVENOUS

## 2016-08-15 NOTE — ED Provider Notes (Signed)
[facility] Provider Note   CSN: [id number] Arrival date & time: 08/15/16  0741     History   Chief Complaint Chief Complaint  Patient presents with  . Medication Reaction    HPI [name] is a 41 y.o. female.  Patient has had long-standing history of depression. Patient has had difficulties with antidepressant medications in the past due to side effects. 10 days ago the patient's primary care doctor gave her a trial of Prozac. Taking 20 mg once a day. Patient has has symptoms that could very well be consistent with the medication. Patient has some headache some diaphoresis and some tremors muscle spasms in her legs and abdomen. An increased heart rate. Patient also 4 days ago had fever up to 102. Has not had a fever since. At her last primary care visit there was some reluctance to try another antidepressant because of the side effect problems she's had in the past but they went with the Prozac. If that did not work then they were planning to refer her to psychiatry for formal evaluation for the depression. Patient is followed by psychology therapist.  In addition patient feels she may be dehydrated. Not eating and drinking very much. Mouth feels dry.       Past Medical History:  Diagnosis Date  . Allergy   . Anxiety   . B12 deficiency   . Depression    not currently   . Dysrhythmia    hx tachy-was from medication nortriptylline  . Esophagitis    rx  . Fibromyalgia   . GERD (gastroesophageal reflux disease)   . History of kidney stones   . Hyperlipidemia   . Intervertebral disc protrusion 01/11/2014  . Migraine   . Obesity (BMI 35.0-39.9 without comorbidity)   . Peripheral neuropathy (HCC)   . Polycystic ovaries     Patient Active Problem List   Diagnosis Date Noted  . Atypical chest pain   . Spinal stenosis of lumbar region 04/01/2015  . Chronic migraine without aura without status migrainosus, not intractable 03/04/2015  . Elevated plasma  metanephrines 11/16/2014  . Alopecia 05/21/2014  . Chronic tachycardia 04/04/2014  . Sciatica 01/11/2014  . Back pain 01/11/2014  . Intervertebral disc protrusion 01/11/2014  . HTN (hypertension) 01/04/2013  . Hemorrhoids 04/25/2012  . Urinary retention 12/11/2010  . CERUMEN IMPACTION 01/28/2010  . Depression 04/08/2009  . FIBROMYALGIA 10/11/2008  . Morbid obesity (HCC) 08/10/2008  . ACNE VULGARIS 08/10/2008  . INSOMNIA 06/20/2008  . VITAMIN B12 DEFICIENCY 03/23/2008  . PERIPHERAL NEUROPATHY 02/27/2008  . CONSTIPATION 08/22/2007  . SINUSITIS, ACUTE FRONTAL 02/07/2007  . ANXIETY DISORDER, GENERALIZED 01/04/2007  . Gastroesophageal reflux disease 01/04/2007  . DEGENERATIVE JOINT DISEASE, KNEES, BILATERAL 01/04/2007  . RENAL CALCULUS, HX OF 01/04/2007    Past Surgical History:  Procedure Laterality Date  . 24 HOUR PH STUDY N/A 05/27/2016   Procedure: 24 HOUR PH STUDY;  Surgeon: Ruffin Frederick, MD;  Location: [facility];  Service: Gastroenterology;  Laterality: N/A;  . APPENDECTOMY    . BACK SURGERY  2012   left laminectomy at L3-4 per Dr. Phoebe Perch   . bladder tack  2012  . CHOLECYSTECTOMY N/A 10/03/2015   Procedure: LAPAROSCOPIC CHOLECYSTECTOMY;  Surgeon: Rodman Pickle, MD;  Location: [facility];  Service: General;  Laterality: N/A;  . ESOPHAGEAL MANOMETRY N/A 05/27/2016   Procedure: ESOPHAGEAL MANOMETRY (EM);  Surgeon: Ruffin Frederick, MD;  Location: [facility];  Service: Gastroenterology;  Laterality: N/A;  . EXCISION OF  SKIN TAG  10/03/2015   Procedure: EXCISION OF SKIN TAG;  Surgeon: De Blanch Kinsinger, MD;  Location: [facility] OR;  Service: General;;  . LUMBAR DISC SURGERY  01-31-14   per Dr. [name]   . OOPHORECTOMY     left overy  . OVARIAN CYST REMOVAL    . RECTOCELE REPAIR    . SINUSOTOMY  12/14/06  . spinal fusion  2016/10  . TONSILLECTOMY    . TONSILLECTOMY    . VAGINAL HYSTERECTOMY    . WRIST GANGLION EXCISION Left     OB History    Gravida Para Term  Preterm AB Living   0 2   SAB TAB Ectopic Multiple Live Births                   Home Medications    Prior to Admission medications   Medication Sig Start Date End Date Taking? Authorizing Provider  ALPRAZolam [name]) 0.5 MG tablet Take 1 tablet (0.5 mg total) by mouth 3 (three) times daily as needed for anxiety. 11/29/15   [name], MD  BD INTEGRA SYRINGE 25G X 1" 3 ML MISC USE AS DIRECTED 10/24/14   [name], MD  calcium carbonate (TUMS - DOSED IN MG ELEMENTAL CALCIUM) 500 MG chewable tablet Chew 1 tablet by mouth 2 (two) times daily as needed for indigestion or heartburn.    Historical Provider, MD  Cholecalciferol (VITAMIN D3) 2000 UNITS TABS Take 2,000 Units by mouth daily.     Historical Provider, MD  cyanocobalamin (,VITAMIN B-12,) 1000 MCG/ML injection INJECT 1 ML (1,000 MCG TOTAL) INTO THE MUSCLE EVERY 7 DAYS. 11/29/15   [name], MD  diltiazem (CARDIZEM) 60 MG tablet Take 1 tablet (60 mg total) by mouth 2 (two) times daily. 1 tablet 2 times daily. If tolerated can increase to three times daily as needed. Patient not taking: Reported on 08/04/2016 06/29/16   Ruffin Frederick, MD  fluconazole (DIFLUCAN) 150 MG tablet TAKE 1 TABLET (150 MG TOTAL) BY MOUTH DAILY. Patient not taking: Reported on 08/04/2016 03/03/16   [name], MD  FLUoxetine (PROZAC) 20 MG tablet Take 1 tablet (20 mg total) by mouth daily. 08/04/16   [name], MD  fluticasone (FLONASE) 50 MCG/ACT nasal spray Place 2 sprays into both nostrils daily as needed for allergies.     Historical Provider, MD  gabapentin (NEURONTIN) 100 MG capsule Take 100-200 mg by mouth 3 (three) times daily as needed.  08/06/15   Historical Provider, MD  gabapentin (NEURONTIN) 300 MG capsule TAKE ONE CAPSULE BY MOUTH 3 TIMES A DAY 07/23/16   [name], MD  hydrochlorothiazide (HYDRODIURIL) 25 MG tablet TAKE 1 TABLET BY MOUTH EVERY DAY 11/25/15   [name], MD  Multiple Vitamins-Minerals (MULTI-VITAMIN GUMMIES  PO) Take 2 tablets by mouth daily.     Historical Provider, MD  oxyCODONE-acetaminophen (PERCOCET) 10-325 MG per tablet Take 1 tablet by mouth every 6 (six) hours as needed for pain. 11/30/13   [name], MD  pantoprazole (PROTONIX) 40 MG tablet TAKE 1 TABLET BY MOUTH TWICE A DAY 05/21/16   [name], MD  phentermine 37.5 MG capsule Take 1 capsule (37.5 mg total) by mouth every morning. 05/05/16   [name], MD  potassium chloride (KLOR-CON 10) 10 MEQ tablet Take 1 tablet (10 mEq total) by mouth daily. 03/26/16   [name], MD  promethazine (PHENERGAN) 25 MG tablet  TAKE 1 TABLET BY MOUTH EVERY 6 HOURS AS NEEDED FOR NAUSEA 07/15/15   [name], MD  rosuvastatin (CRESTOR) 10 MG tablet Take 1 tablet (10 mg total) by mouth daily. Patient not taking: Reported on 08/04/2016 06/08/16   [name], MD  simethicone (MYLICON) 80 MG chewable tablet Chew 160 mg by mouth every 6 (six) hours as needed (gas pain).    Historical Provider, MD  sucralfate (CARAFATE) 1 GM/10ML suspension Take 10 mLs (1 g total) by mouth 4 (four) times daily -  with meals and at bedtime. 08/29/15   Amy S Esterwood, PA-C  SUMAtriptan (IMITREX) 50 MG tablet TAKE 1 TABLET BY MOUTH AT ONSET OF HEADACHE MAY REPEAT ONCE IN 2 HOURS IF PERSISTS OR RECURS Patient taking differently: TAKE 150 MG BY MOUTH AT ONSET OF HEADACHE MAY REPEAT ONCE IN 2 HOURS IF PERSISTS OR RECURS 01/15/15   [name], DO  Vitamin D, Ergocalciferol, (DRISDOL) 50000 units CAPS capsule Take 1 capsule (50,000 Units total) by mouth every 7 (seven) days. 03/26/16   [name], MD    Family History Family History  Problem Relation Age of Onset  . Fibromyalgia Mother   . Diabetes Mother   . Thyroid cancer Mother   . Liver disease Mother   . Neuropathy Mother   . Cirrhosis Mother     non-alcoholic  . Pulmonary embolism Mother     both lungs  . Hypertension Father   . Diabetes Father   . Heart disease Father   . Prostate cancer Father   . Heart  attack Father     x 2  . Colon cancer Neg Hx   . Other Neg Hx     pheochromocytoma  . Colon polyps Neg Hx     Social History Social History  Substance Use Topics  . Smoking status: Former Smoker    Types: Cigarettes    Quit date: 06/01/1998  . Smokeless tobacco: Never Used  . Alcohol use No     Allergies   Gadolinium derivatives and Zoloft [sertraline hcl]   Review of Systems Review of Systems  Constitutional: Positive for fatigue and fever.  HENT: Negative for congestion.   Eyes: Negative for photophobia.  Respiratory: Negative for shortness of breath.   Cardiovascular: Negative for chest pain.  Gastrointestinal: Negative for abdominal pain and vomiting.  Genitourinary: Negative for dysuria and hematuria.  Musculoskeletal: Positive for myalgias.  Neurological: Positive for tremors, weakness and headaches.  Hematological: Does not bruise/bleed easily.  Psychiatric/Behavioral: Negative for confusion.     Physical Exam Updated Vital Signs BP 134/82 (BP Location: Right Arm)   Pulse (!) 102   Temp 98.5 F (36.9 C) (Oral)   Resp 16   Ht 5' 9.5" (1.765 m)   Wt 116.6 kg   LMP 12/13/1999   SpO2 99%   BMI 37.41 kg/m   Physical Exam  Constitutional: She is oriented to person, place, and time. She appears well-developed and well-nourished. No distress.  HENT:  Head: Normocephalic and atraumatic.  Mucous membranes dry.  Eyes: Conjunctivae and EOM are normal. Pupils are equal, round, and reactive to light.  Neck: Normal range of motion. Neck supple.  Cardiovascular: Regular rhythm.   Tachycardic  Pulmonary/Chest: Effort normal and breath sounds normal. No respiratory distress.  Abdominal: Soft. Bowel sounds are normal. There is no tenderness.  Musculoskeletal: Normal range of motion. She exhibits no edema.  Neurological: She is oriented to person, place, and time. No cranial nerve  deficit or sensory deficit. She exhibits normal muscle tone. Coordination normal.    Skin: No rash noted.  Nursing note and vitals reviewed.    ED Treatments / Results  Labs (all labs ordered are listed, but only abnormal results are displayed) Labs Reviewed  COMPREHENSIVE METABOLIC PANEL - Abnormal; Notable for the following:       Result Value   Potassium 3.2 (*)    Glucose, Bld 111 (*)    Calcium 8.6 (*)    All other components within normal limits  CBC WITH DIFFERENTIAL/PLATELET - Abnormal; Notable for the following:    WBC 11.4 (*)    RBC 5.13 (*)    Hemoglobin 15.5 (*)    Lymphs Abs 4.6 (*)    All other components within normal limits  CK    EKG  EKG Interpretation  Date/Time:  Saturday August 15 2016 08:47:15 EDT Ventricular Rate:  109 PR Interval:    QRS Duration: 82 QT Interval:  337 QTC Calculation: 454 R Axis:   52 Text Interpretation:  Sinus tachycardia No significant change since last tracing Confirmed by Javarri Segal  MD, Emanuele Mcwhirter ([id number]) on 08/15/2016 9:02:15 AM       Radiology No results found.  Procedures Procedures (including critical care time)  Medications Ordered in ED Medications  0.9 %  sodium chloride infusion ( Intravenous Stopped 08/15/16 1110)  sodium chloride 0.9 % bolus 1,000 mL (0 mLs Intravenous Stopped 08/15/16 0944)  sodium chloride 0.9 % bolus 1,000 mL (0 mLs Intravenous Stopped 08/15/16 1045)     Initial Impression / Assessment and Plan / ED Course  I have reviewed the triage vital signs and the nursing notes.  Pertinent labs & imaging results that were available during my care of the patient were reviewed by me and considered in my medical decision making (see chart for details).     Patient's symptoms certainly could be related to side effects from the Prozac. Fever being present for days ago more suggestive of some sort of viral type illness. Does not seem to be consistent with what one would expect from neuroleptic malignant syndrome. In addition sellar symptoms could be mild serotonin syndrome. But really on  neuro exam no evidence of altered mental status no delirium. Does have some evidence of autonomic instability with some tachycardia and some slight hypertension. History of diaphoresis but none now. No significant myoclonia S ocular clonus or rigidity may be a slight tremor.   Clinically if serotonin syndrome very mild.  Labs without significant abnormalities. CK was normal this was done as a baseline.  The patient was significant improvement with 2 L of normal saline. Finally did void. Do feel there was a component of dehydration. Heart rate now is down to around the 100 and blood pressure has improved as well.  Patient currently nontoxic no acute distress. Would recommend stopping the Prozac. Patient was already on the lowest daily dose. She will discuss with her primary care doctor whether he would consider a every other day tapering. But based on the side effect problems she's having a thing is best just to stop. Additional workup and follow-up as per primary care doctor.  Patient given precautions to return for any new or worse symptoms at all.  Final Clinical Impressions(s) / ED Diagnoses   Final diagnoses:  Medication reaction, initial encounter    New Prescriptions New Prescriptions   No medications on file     Vanetta Mulders, MD 08/15/16 1145

## 2016-08-15 NOTE — ED Triage Notes (Signed)
Patient states she was started on Prozac 10 days ago.  States four days ago, she has had multiple symptoms, headache, sweating, shakes, fever, muscle spasms in her legs and abdomen, sob, increased heart rate.  States she felt like she had a virus, but last night her husband read the medication side effects and feels like all of her symptoms are related to the medication.  This is the ninth medication she has taken and has had side effects from all of the medications.  States also, had a genecology testing to determine which would be the best medication and the Prozac was one of the 4 best medications.

## 2016-08-15 NOTE — ED Notes (Signed)
ED Provider at bedside. 

## 2016-08-15 NOTE — Discharge Instructions (Signed)
Recommend stopping the Prozac. Contact your primary care doctor on Monday for further direction. Return here for any new or worse symptoms. Continue other current medications.

## 2016-08-18 ENCOUNTER — Ambulatory Visit: Payer: BLUE CROSS/BLUE SHIELD | Admitting: Licensed Clinical Social Worker

## 2016-08-21 ENCOUNTER — Other Ambulatory Visit: Payer: Self-pay | Admitting: Neurology

## 2016-08-21 ENCOUNTER — Other Ambulatory Visit: Payer: Self-pay | Admitting: Family Medicine

## 2016-08-21 ENCOUNTER — Telehealth: Payer: Self-pay | Admitting: Neurology

## 2016-08-21 NOTE — Telephone Encounter (Signed)
Patient asked could she have enough Sumatriptan 50 mg until she can get in on 10/12/16 to follow up with Dr. Everlena CooperJaffe. Thanks

## 2016-08-26 NOTE — Telephone Encounter (Signed)
Spoke to patient. Advised unable to Rx med b/c last visit was in  03/2015.

## 2016-08-31 ENCOUNTER — Ambulatory Visit (INDEPENDENT_AMBULATORY_CARE_PROVIDER_SITE_OTHER): Payer: BLUE CROSS/BLUE SHIELD | Admitting: Licensed Clinical Social Worker

## 2016-08-31 DIAGNOSIS — F331 Major depressive disorder, recurrent, moderate: Secondary | ICD-10-CM

## 2016-09-03 ENCOUNTER — Emergency Department (HOSPITAL_BASED_OUTPATIENT_CLINIC_OR_DEPARTMENT_OTHER)
Admission: EM | Admit: 2016-09-03 | Discharge: 2016-09-03 | Disposition: A | Discharge status: Home / Self Care | Payer: BLUE CROSS/BLUE SHIELD | Attending: Emergency Medicine | Admitting: Emergency Medicine

## 2016-09-03 ENCOUNTER — Encounter (HOSPITAL_BASED_OUTPATIENT_CLINIC_OR_DEPARTMENT_OTHER): Payer: Self-pay | Admitting: Emergency Medicine

## 2016-09-03 ENCOUNTER — Emergency Department (HOSPITAL_BASED_OUTPATIENT_CLINIC_OR_DEPARTMENT_OTHER): Payer: BLUE CROSS/BLUE SHIELD

## 2016-09-03 DIAGNOSIS — R002 Palpitations: Secondary | ICD-10-CM | POA: Insufficient documentation

## 2016-09-03 DIAGNOSIS — Z87891 Personal history of nicotine dependence: Secondary | ICD-10-CM | POA: Diagnosis not present

## 2016-09-03 DIAGNOSIS — I10 Essential (primary) hypertension: Secondary | ICD-10-CM | POA: Insufficient documentation

## 2016-09-03 DIAGNOSIS — R000 Tachycardia, unspecified: Secondary | ICD-10-CM | POA: Diagnosis not present

## 2016-09-03 DIAGNOSIS — R079 Chest pain, unspecified: Secondary | ICD-10-CM | POA: Diagnosis not present

## 2016-09-03 DIAGNOSIS — R0602 Shortness of breath: Secondary | ICD-10-CM | POA: Insufficient documentation

## 2016-09-03 DIAGNOSIS — R0789 Other chest pain: Secondary | ICD-10-CM | POA: Insufficient documentation

## 2016-09-03 DIAGNOSIS — Z79899 Other long term (current) drug therapy: Secondary | ICD-10-CM | POA: Insufficient documentation

## 2016-09-03 DIAGNOSIS — R05 Cough: Secondary | ICD-10-CM | POA: Diagnosis not present

## 2016-09-03 DIAGNOSIS — R0600 Dyspnea, unspecified: Secondary | ICD-10-CM | POA: Diagnosis not present

## 2016-09-03 HISTORY — DX: Spinal stenosis, lumbar region without neurogenic claudication: M48.061

## 2016-09-03 LAB — CBC WITH DIFFERENTIAL/PLATELET
Basophils Absolute: 0 10*3/uL (ref 0.0–0.1)
Basophils Relative: 0 %
Eosinophils Absolute: 0.1 10*3/uL (ref 0.0–0.7)
Eosinophils Relative: 2 %
HCT: 39.8 % (ref 36.0–46.0)
Hemoglobin: 14.1 g/dL (ref 12.0–15.0)
Lymphocytes Relative: 28 %
Lymphs Abs: 2.2 10*3/uL (ref 0.7–4.0)
MCH: 30.7 pg (ref 26.0–34.0)
MCHC: 35.4 g/dL (ref 30.0–36.0)
MCV: 86.5 fL (ref 78.0–100.0)
Monocytes Absolute: 0.7 10*3/uL (ref 0.1–1.0)
Monocytes Relative: 9 %
Neutro Abs: 5 10*3/uL (ref 1.7–7.7)
Neutrophils Relative %: 61 %
Platelets: 279 10*3/uL (ref 150–400)
RBC: 4.6 MIL/uL (ref 3.87–5.11)
RDW: 12.7 % (ref 11.5–15.5)
WBC: 8.1 10*3/uL (ref 4.0–10.5)

## 2016-09-03 LAB — RAPID URINE DRUG SCREEN, HOSP PERFORMED
Amphetamines: NOT DETECTED
Barbiturates: NOT DETECTED
Benzodiazepines: NOT DETECTED
Cocaine: NOT DETECTED
Opiates: POSITIVE — AB
Tetrahydrocannabinol: NOT DETECTED

## 2016-09-03 LAB — BASIC METABOLIC PANEL
Anion gap: 11 (ref 5–15)
BUN: 14 mg/dL (ref 6–20)
CO2: 26 mmol/L (ref 22–32)
Calcium: 8.4 mg/dL — ABNORMAL LOW (ref 8.9–10.3)
Chloride: 100 mmol/L — ABNORMAL LOW (ref 101–111)
Creatinine, Ser: 0.93 mg/dL (ref 0.44–1.00)
GFR calc Af Amer: >60 mL/min (ref >60)
GFR calc non Af Amer: >60 mL/min (ref >60)
Glucose, Bld: 136 mg/dL — ABNORMAL HIGH (ref 65–99)
Potassium: 3.4 mmol/L — ABNORMAL LOW (ref 3.5–5.1)
Sodium: 137 mmol/L (ref 135–145)

## 2016-09-03 LAB — TROPONIN I
Troponin I: <0.03 ng/mL (ref <0.03)
Troponin I: <0.03 ng/mL (ref <0.03)

## 2016-09-03 LAB — TSH: TSH: 2.022 u[IU]/mL (ref 0.350–4.500)

## 2016-09-03 MED ORDER — IOPAMIDOL (ISOVUE-370) INJECTION 76%
100.0000 mL | Freq: Once | INTRAVENOUS | Status: AC | PRN
  Administered 2016-09-03: 100 mL via INTRAVENOUS

## 2016-09-03 MED ORDER — OXYCODONE-ACETAMINOPHEN 5-325 MG PO TABS
2.0000 | ORAL_TABLET | Freq: Once | ORAL | Status: AC
  Administered 2016-09-03: 2 via ORAL
  Filled 2016-09-03: qty 2

## 2016-09-03 MED ORDER — ONDANSETRON HCL 4 MG/2ML IJ SOLN
4.0000 mg | Freq: Once | INTRAMUSCULAR | Status: AC
  Administered 2016-09-03: 4 mg via INTRAVENOUS
  Filled 2016-09-03: qty 2

## 2016-09-03 MED ORDER — SODIUM CHLORIDE 0.9 % IV BOLUS (SEPSIS)
1000.0000 mL | Freq: Once | INTRAVENOUS | Status: AC
  Administered 2016-09-03: 1000 mL via INTRAVENOUS

## 2016-09-03 MED ORDER — DILTIAZEM HCL 25 MG/5ML IV SOLN
15.0000 mg | Freq: Once | INTRAVENOUS | Status: AC
  Administered 2016-09-03: 15 mg via INTRAVENOUS
  Filled 2016-09-03: qty 5

## 2016-09-03 MED ORDER — MORPHINE SULFATE (PF) 4 MG/ML IV SOLN
4.0000 mg | Freq: Once | INTRAVENOUS | Status: AC
  Administered 2016-09-03: 4 mg via INTRAVENOUS
  Filled 2016-09-03: qty 1

## 2016-09-03 NOTE — ED Provider Notes (Signed)
[facility] Provider Note   CSN: [id number] Arrival date & time: 09/03/16  0408     History   Chief Complaint Chief Complaint  Patient presents with  . Shortness of Breath    HPI Fatim Vanderschaaf is a 41 y.o. female.  HPI  This is a 41 year old female with a history of dysrhythmia, GERD, neuropathy, anxiety who presents with chest pain and shortness of breath. Patient reports onset of symptoms at 10 PM. She states that she feels like she is having palpitations. She feels short of breath. Worse with exertion. Worse with going up steps. Denies orthopnea. Reports recent history of left leg swelling. She states that she normally has pain in that leg secondary to neuropathy but not swelling. No history of blood clots but does have a family history of blood clots. She reports sternal chest pain that radiates into her left arm. Current pain is 7 out of 10. Patient states that she took 2 Xanax when she noted that her heart was racing in an attempt to bring it down.  Past Medical History:  Diagnosis Date  . Allergy   . Anxiety   . B12 deficiency   . Depression    not currently   . Dysrhythmia    hx tachy-was from medication nortriptylline  . Esophagitis    rx  . Fibromyalgia   . GERD (gastroesophageal reflux disease)   . History of kidney stones   . Hyperlipidemia   . Intervertebral disc protrusion 01/11/2014  . Migraine   . Obesity (BMI 35.0-39.9 without comorbidity)   . Peripheral neuropathy (HCC)   . Polycystic ovaries   . Spinal stenosis, lumbar     Patient Active Problem List   Diagnosis Date Noted  . Atypical chest pain   . Spinal stenosis of lumbar region 04/01/2015  . Chronic migraine without aura without status migrainosus, not intractable 03/04/2015  . Elevated plasma metanephrines 11/16/2014  . Alopecia 05/21/2014  . Chronic tachycardia 04/04/2014  . Sciatica 01/11/2014  . Back pain 01/11/2014  . Intervertebral disc protrusion 01/11/2014  . HTN  (hypertension) 01/04/2013  . Hemorrhoids 04/25/2012  . Urinary retention 12/11/2010  . CERUMEN IMPACTION 01/28/2010  . Depression 04/08/2009  . FIBROMYALGIA 10/11/2008  . Morbid obesity (HCC) 08/10/2008  . ACNE VULGARIS 08/10/2008  . INSOMNIA 06/20/2008  . VITAMIN B12 DEFICIENCY 03/23/2008  . PERIPHERAL NEUROPATHY 02/27/2008  . CONSTIPATION 08/22/2007  . SINUSITIS, ACUTE FRONTAL 02/07/2007  . ANXIETY DISORDER, GENERALIZED 01/04/2007  . Gastroesophageal reflux disease 01/04/2007  . DEGENERATIVE JOINT DISEASE, KNEES, BILATERAL 01/04/2007  . RENAL CALCULUS, HX OF 01/04/2007    Past Surgical History:  Procedure Laterality Date  . 24 HOUR PH STUDY N/A 05/27/2016   Procedure: 24 HOUR PH STUDY;  Surgeon: Ruffin Frederick, MD;  Location: [facility];  Service: Gastroenterology;  Laterality: N/A;  . APPENDECTOMY    . BACK SURGERY  2012   left laminectomy at L3-4 per Dr. Phoebe Perch   . bladder tack  2012  . CHOLECYSTECTOMY N/A 10/03/2015   Procedure: LAPAROSCOPIC CHOLECYSTECTOMY;  Surgeon: Rodman Pickle, MD;  Location: [facility] OR;  Service: General;  Laterality: N/A;  . ESOPHAGEAL MANOMETRY N/A 05/27/2016   Procedure: ESOPHAGEAL MANOMETRY (EM);  Surgeon: Ruffin Frederick, MD;  Location: [facility];  Service: Gastroenterology;  Laterality: N/A;  . EXCISION OF SKIN TAG  10/03/2015   Procedure: EXCISION OF SKIN TAG;  Surgeon: De Blanch Kinsinger, MD;  Location: [facility] OR;  Service: General;;  . LUMBAR DISC SURGERY  01-31-14   per Dr. [name]   . OOPHORECTOMY     left overy  . OVARIAN CYST REMOVAL    . RECTOCELE REPAIR    . SINUSOTOMY  12/14/06  . spinal fusion  2016/10  . TONSILLECTOMY    . TONSILLECTOMY    . VAGINAL HYSTERECTOMY    . WRIST GANGLION EXCISION Left     OB History    Gravida Para Term Preterm AB Living   0 2   SAB TAB Ectopic Multiple Live Births                   Home Medications    Prior to Admission medications   Medication Sig Start Date End  Date Taking? Authorizing Provider  ALPRAZolam [name]) 0.5 MG tablet Take 1 tablet (0.5 mg total) by mouth 3 (three) times daily as needed for anxiety. 11/29/15   [name], MD  BD INTEGRA SYRINGE 25G X 1" 3 ML MISC USE AS DIRECTED 10/24/14   [name], MD  calcium carbonate (TUMS - DOSED IN MG ELEMENTAL CALCIUM) 500 MG chewable tablet Chew 1 tablet by mouth 2 (two) times daily as needed for indigestion or heartburn.    Historical Provider, MD  Cholecalciferol (VITAMIN D3) 2000 UNITS TABS Take 2,000 Units by mouth daily.     Historical Provider, MD  cyanocobalamin (,VITAMIN B-12,) 1000 MCG/ML injection INJECT 1 ML (1,000 MCG TOTAL) INTO THE MUSCLE EVERY 7 DAYS. 11/29/15   [name], MD  diltiazem (CARDIZEM) 60 MG tablet Take 1 tablet (60 mg total) by mouth 2 (two) times daily. 1 tablet 2 times daily. If tolerated can increase to three times daily as needed. Patient not taking: Reported on 08/04/2016 06/29/16   Ruffin Frederick, MD  fluconazole (DIFLUCAN) 150 MG tablet TAKE 1 TABLET (150 MG TOTAL) BY MOUTH DAILY. Patient not taking: Reported on 08/04/2016 03/03/16   [name], MD  FLUoxetine (PROZAC) 20 MG tablet Take 1 tablet (20 mg total) by mouth daily. 08/04/16   [name], MD  fluticasone (FLONASE) 50 MCG/ACT nasal spray Place 2 sprays into both nostrils daily as needed for allergies.     Historical Provider, MD  gabapentin (NEURONTIN) 100 MG capsule Take 100-200 mg by mouth 3 (three) times daily as needed.  08/06/15   Historical Provider, MD  gabapentin (NEURONTIN) 300 MG capsule TAKE ONE CAPSULE BY MOUTH 3 TIMES A DAY 07/23/16   [name], MD  hydrochlorothiazide (HYDRODIURIL) 25 MG tablet TAKE 1 TABLET BY MOUTH EVERY DAY 11/25/15   [name], MD  Multiple Vitamins-Minerals (MULTI-VITAMIN GUMMIES PO) Take 2 tablets by mouth daily.     Historical Provider, MD  oxyCODONE-acetaminophen (PERCOCET) 10-325 MG per tablet Take 1 tablet by mouth every 6 (six) hours as needed for pain.  11/30/13   [name], MD  pantoprazole (PROTONIX) 40 MG tablet TAKE 1 TABLET BY MOUTH TWICE A DAY 05/21/16   [name], MD  phentermine 37.5 MG capsule Take 1 capsule (37.5 mg total) by mouth every morning. 05/05/16   [name], MD  potassium chloride (KLOR-CON 10) 10 MEQ tablet Take 1 tablet (10 mEq total) by mouth daily. 03/26/16   [name], MD  promethazine (PHENERGAN) 25 MG tablet TAKE 1 TABLET BY MOUTH EVERY 6 HOURS AS NEEDED FOR NAUSEA 08/21/16   [name], MD  rosuvastatin (CRESTOR) 10 MG tablet Take 1 tablet (10 mg  total) by mouth daily. Patient not taking: Reported on 08/04/2016 06/08/16   [name], MD  simethicone (MYLICON) 80 MG chewable tablet Chew 160 mg by mouth every 6 (six) hours as needed (gas pain).    Historical Provider, MD  sucralfate (CARAFATE) 1 GM/10ML suspension Take 10 mLs (1 g total) by mouth 4 (four) times daily -  with meals and at bedtime. 08/29/15   Amy S Esterwood, PA-C  SUMAtriptan (IMITREX) 50 MG tablet TAKE 1 TABLET BY MOUTH AT ONSET OF HEADACHE MAY REPEAT ONCE IN 2 HOURS IF PERSISTS OR RECURS Patient taking differently: TAKE 150 MG BY MOUTH AT ONSET OF HEADACHE MAY REPEAT ONCE IN 2 HOURS IF PERSISTS OR RECURS 01/15/15   [name], DO  Vitamin D, Ergocalciferol, (DRISDOL) 50000 units CAPS capsule Take 1 capsule (50,000 Units total) by mouth every 7 (seven) days. 03/26/16   [name], MD    Family History Family History  Problem Relation Age of Onset  . Fibromyalgia Mother   . Diabetes Mother   . Thyroid cancer Mother   . Liver disease Mother   . Neuropathy Mother   . Cirrhosis Mother     non-alcoholic  . Pulmonary embolism Mother     both lungs  . Hypertension Father   . Diabetes Father   . Heart disease Father   . Prostate cancer Father   . Heart attack Father     x 2  . Colon cancer Neg Hx   . Other Neg Hx     pheochromocytoma  . Colon polyps Neg Hx     Social History Social History  Substance Use Topics  . Smoking  status: Former Smoker    Types: Cigarettes    Quit date: 06/01/1998  . Smokeless tobacco: Never Used  . Alcohol use No     Allergies   Gadolinium derivatives and Zoloft [sertraline hcl]   Review of Systems Review of Systems  Constitutional: Negative for fever.  Respiratory: Positive for shortness of breath. Negative for cough.   Cardiovascular: Positive for chest pain and leg swelling.  Gastrointestinal: Negative for abdominal pain, nausea and vomiting.  All other systems reviewed and are negative.    Physical Exam Updated Vital Signs BP 108/63   Pulse (!) 115   Temp 98.2 F (36.8 C) (Oral)   Resp 18   Ht  (1.753 m)   Wt 260 lb (117.9 kg)   LMP 12/13/1999 Comment: single oophorectomy//a.c.  SpO2 97%   BMI 38.40 kg/m   Physical Exam  Constitutional: She is oriented to person, place, and time. She appears well-developed and well-nourished.  Overweight  HENT:  Head: Normocephalic and atraumatic.  Cardiovascular: Regular rhythm and normal heart sounds.   Tachycardia  Pulmonary/Chest: Effort normal. No respiratory distress. She has no wheezes. She exhibits no tenderness.  Abdominal: Soft. Bowel sounds are normal. There is no tenderness. There is no guarding.  Musculoskeletal:  No objective asymmetric swelling of the left calf, no redness, erythema, specific tenderness  Neurological: She is alert and oriented to person, place, and time.  Skin: Skin is warm and dry.  Psychiatric: She has a normal mood and affect.  Nursing note and vitals reviewed.    ED Treatments / Results  Labs (all labs ordered are listed, but only abnormal results are displayed) Labs Reviewed  BASIC METABOLIC PANEL - Abnormal; Notable for the following:       Result Value   Potassium 3.4 (*)  Chloride 100 (*)    Glucose, Bld 136 (*)    Calcium 8.4 (*)    All other components within normal limits  RAPID URINE DRUG SCREEN, HOSP PERFORMED - Abnormal; Notable for the following:     Opiates POSITIVE (*)    All other components within normal limits  CBC WITH DIFFERENTIAL/PLATELET  TROPONIN I  TSH  TROPONIN I    EKG  EKG Interpretation  Date/Time:  Thursday September 03 2016 04:16:35 EDT Ventricular Rate:  144 PR Interval:    QRS Duration: 77 QT Interval:  286 QTC Calculation: 443 R Axis:   53 Text Interpretation:  Sinus tachycardia Baseline wander in lead(s) V2 Faster rate than prior Confirmed by HORTON  MD, COURTNEY ([id number]) on 09/03/2016 4:18:28 AM       Radiology Dg Chest 2 View  Result Date: 09/03/2016 CLINICAL DATA:  Nonproductive cough and left anterior chest pain for 2 days EXAM: CHEST  2 VIEW COMPARISON:  08/28/2015 FINDINGS: The lungs are clear. The pulmonary vasculature is normal. Heart size is normal. Hilar and mediastinal contours are unremarkable. There is no pleural effusion. IMPRESSION: No active cardiopulmonary disease. Electronically Signed   By: Ellery Plunk M.D.   On: 09/03/2016 05:02   Ct Angio Chest Pe W And/or Wo Contrast  Result Date: 09/03/2016 CLINICAL DATA:  Dyspnea for 2 days.  Nonproductive cough. EXAM: CT ANGIOGRAPHY CHEST WITH CONTRAST TECHNIQUE: Multidetector CT imaging of the chest was performed using the standard protocol during bolus administration of intravenous contrast. Multiplanar CT image reconstructions and MIPs were obtained to evaluate the vascular anatomy. CONTRAST:  100 mL Isovue 370 intravenous COMPARISON:  05/11/2014 FINDINGS: Cardiovascular: Satisfactory opacification of the pulmonary arteries to the segmental level. No evidence of pulmonary embolism. Normal heart size. No pericardial effusion. Mediastinum/Nodes: No enlarged mediastinal, hilar, or axillary lymph nodes. Thyroid gland, trachea, and esophagus demonstrate no significant findings. Lungs/Pleura: Lungs are clear. No pleural effusion or pneumothorax. Upper Abdomen: No acute abnormality. Musculoskeletal: No chest wall abnormality. No acute or significant osseous  findings. Review of the MIP images confirms the above findings. IMPRESSION: Negative for acute pulmonary embolism.  No significant abnormality. Electronically Signed   By: Ellery Plunk M.D.   On: 09/03/2016 05:40    Procedures Procedures (including critical care time)  Medications Ordered in ED Medications  morphine 4 MG/ML injection 4 mg (4 mg Intravenous Given 09/03/16 0438)  ondansetron (ZOFRAN) injection 4 mg (4 mg Intravenous Given 09/03/16 0438)  sodium chloride 0.9 % bolus 1,000 mL (0 mLs Intravenous Stopped 09/03/16 0551)  iopamidol (ISOVUE-370) 76 % injection 100 mL (100 mLs Intravenous Contrast Given 09/03/16 0518)  sodium chloride 0.9 % bolus 1,000 mL (1,000 mLs Intravenous New Bag/Given 09/03/16 0554)  diltiazem (CARDIZEM) injection 15 mg (15 mg Intravenous Given 09/03/16 0557)  oxyCODONE-acetaminophen (PERCOCET/ROXICET) 5-325 MG per tablet 2 tablet (2 tablets Oral Given 09/03/16 8119)     Initial Impression / Assessment and Plan / ED Course  I have reviewed the triage vital signs and the nursing notes.  Pertinent labs & imaging results that were available during my care of the patient were reviewed by me and considered in my medical decision making (see chart for details).  Clinical Course as of Sep 04 642  Thu Sep 03, 2016  0600 CT angio negative. On recheck, patient with persistent palpitations. Heart rate improved to the 120s. Repeat bolus ordered. Patient ordered 15 mg of diltiazem. Reports history of dysrhythmia while taking nortriptyline. This completely resolved after stopping  this medication. TSH and UDS added to workup.  [CH]  [phone] Patient heart rate now 110 after second liter of fluids and 50 mg diltiazem bolus. She is due to start diltiazem 60 mg twice a day for esophageal issues. This would likely help her tachycardia. Her chest pain is very atypical and initial troponin negative. She has pain since 10 PM. Will repeat troponin. TSH pending. UDS positive for opiates.  Interestingly, negative for benzodiazepine although the patient reports taking 2 Xanax prior to coming in.  [CH]    Clinical Course User Index [CH] Shon Baton, MD    Anner Crete criteria: 4.5, moderate  Final Clinical Impressions(s) / ED Diagnoses   Final diagnoses:  Tachycardia  Palpitations  Atypical chest pain    New Prescriptions New Prescriptions   No medications on file     Shon Baton, MD 09/03/16 [phone]

## 2016-09-03 NOTE — ED Notes (Signed)
Pt reports taking 2 xanax and a percocet prior to coming without relief of symptoms.

## 2016-09-03 NOTE — ED Triage Notes (Signed)
Pt reports shob since yesterday and left arm pain. Pt reports dry cough.

## 2016-09-03 NOTE — ED Notes (Signed)
Pt requesting more pain medication. Dr. Horton made aware. 

## 2016-09-03 NOTE — Discharge Instructions (Addendum)
Follow-up with cardiology for repeat evaluation. Start diltiazem as prescribed. If you have any new or worsening symptoms you need to be reevaluated.

## 2016-09-03 NOTE — ED Notes (Signed)
Pt ambulatory to d/c window. Family member present

## 2016-09-04 ENCOUNTER — Encounter: Payer: Self-pay | Admitting: Physician Assistant

## 2016-09-04 ENCOUNTER — Ambulatory Visit (INDEPENDENT_AMBULATORY_CARE_PROVIDER_SITE_OTHER): Payer: BLUE CROSS/BLUE SHIELD | Admitting: Family Medicine

## 2016-09-04 ENCOUNTER — Ambulatory Visit: Payer: Self-pay | Admitting: Physician Assistant

## 2016-09-04 ENCOUNTER — Ambulatory Visit (INDEPENDENT_AMBULATORY_CARE_PROVIDER_SITE_OTHER): Payer: BLUE CROSS/BLUE SHIELD | Admitting: Physician Assistant

## 2016-09-04 ENCOUNTER — Encounter: Payer: Self-pay | Admitting: Family Medicine

## 2016-09-04 VITALS — BP 120/80 | HR 121 | Ht 69.0 in | Wt 268.0 lb

## 2016-09-04 VITALS — BP 116/76 | HR 100 | Temp 98.4°F | Ht 69.0 in | Wt 269.3 lb

## 2016-09-04 DIAGNOSIS — R0789 Other chest pain: Secondary | ICD-10-CM | POA: Diagnosis not present

## 2016-09-04 DIAGNOSIS — I10 Essential (primary) hypertension: Secondary | ICD-10-CM | POA: Diagnosis not present

## 2016-09-04 DIAGNOSIS — B9789 Other viral agents as the cause of diseases classified elsewhere: Secondary | ICD-10-CM | POA: Diagnosis not present

## 2016-09-04 DIAGNOSIS — R000 Tachycardia, unspecified: Secondary | ICD-10-CM

## 2016-09-04 DIAGNOSIS — F411 Generalized anxiety disorder: Secondary | ICD-10-CM | POA: Diagnosis not present

## 2016-09-04 DIAGNOSIS — B349 Viral infection, unspecified: Secondary | ICD-10-CM

## 2016-09-04 DIAGNOSIS — J069 Acute upper respiratory infection, unspecified: Secondary | ICD-10-CM | POA: Diagnosis not present

## 2016-09-04 LAB — POC INFLUENZA A&B (BINAX/QUICKVUE)
Influenza A, POC: NEGATIVE
Influenza B, POC: POSITIVE — AB

## 2016-09-04 MED ORDER — DILTIAZEM HCL ER 60 MG PO CP12: 60.0000 mg | ORAL_CAPSULE | Freq: Three times a day (TID) | ORAL | 3 refills | Status: DC

## 2016-09-04 MED ORDER — BENZONATATE 100 MG PO CAPS: 100.0000 mg | ORAL_CAPSULE | Freq: Two times a day (BID) | ORAL | 0 refills | Status: DC

## 2016-09-04 MED ORDER — HYDROCHLOROTHIAZIDE 25 MG PO TABS: 12.5000 mg | ORAL_TABLET | Freq: Every day | ORAL | 3 refills | Status: DC

## 2016-09-04 MED ORDER — OSELTAMIVIR PHOSPHATE 75 MG PO CAPS: 75.0000 mg | ORAL_CAPSULE | Freq: Two times a day (BID) | ORAL | 0 refills | Status: DC

## 2016-09-04 NOTE — Patient Instructions (Addendum)
Take the tamiflu as instructed.  Plenty of fluids.  Tessalon for cough as needed.  Follow up with your heart doctor if any heart concerns and as planned.  I hope you are feeling better soon! Seek care immediately if worsening, new concerns or you are not improving with treatment.  Influenza, Adult Influenza ("the flu") is an infection in the lungs, nose, and throat (respiratory tract). It is caused by a virus. The flu causes many common cold symptoms, as well as a high fever and body aches. It can make you feel very sick. The flu spreads easily from person to person (is contagious). Getting a flu shot (influenza vaccination) every year is the best way to prevent the flu. Follow these instructions at home:  Take over-the-counter and prescription medicines only as told by your doctor.  Use a cool mist humidifier to add moisture (humidity) to the air in your home. This can make it easier to breathe.  Rest as needed.  Drink enough fluid to keep your pee (urine) clear or pale yellow.  Cover your mouth and nose when you cough or sneeze.  Wash your hands with soap and water often, especially after you cough or sneeze. If you cannot use soap and water, use hand sanitizer.  Stay home from work or school as told by your doctor. Unless you are visiting your doctor, try to avoid leaving home until your fever has been gone for 24 hours without the use of medicine.  Keep all follow-up visits as told by your doctor. This is important. How is this prevented?  Getting a yearly (annual) flu shot is the best way to avoid getting the flu. You may get the flu shot in late summer, fall, or winter. Ask your doctor when you should get your flu shot.  Wash your hands often or use hand sanitizer often.  Avoid contact with people who are sick during cold and flu season.  Eat healthy foods.  Drink plenty of fluids.  Get enough sleep.  Exercise regularly.  Get help right away if:  You start to be  short of breath or have trouble breathing.  Your skin or nails turn a bluish color.  You have very bad pain or stiffness in your neck.  You get a sudden headache.  You get sudden pain in your face or ear.  You cannot stop throwing up. This information is not intended to replace advice given to you by your health care provider. Make sure you discuss any questions you have with your health care provider. Document Released: 02/25/2008 Document Revised: 10/24/2015 Document Reviewed: 03/12/2015 Elsevier Interactive Patient Education  2017 [employer].

## 2016-09-04 NOTE — Progress Notes (Signed)
Cardiology Office Note:    Date:  09/04/2016   ID:  Nery Kalisz, DOB 1975-12-19, MRN [id number]  PCP:  Alysia Penna, MD  Cardiologist:  Dr. Jenkins Rouge   Electrophysiologist:  Dr. Virl Axe   Referring MD: Laurey Morale, MD   Chief Complaint  Patient presents with  . Tachycardia    History of Present Illness:    [name] is a 41 y.o. female with a hx of labile HTN, sinus tachycardia, depression, obesity.  She was initially evaluated by Dr. Jenkins Rouge in 2/16.  Prior workup demonstrated normal TSH, hemoglobin.  ESR was elevated but w/u for connective tissue disease was negative.  CTA was neg for pulmonary embolism.  She had side effects to beta-blocker (fatigue, hair loss).  Urine for catecholamines/metanephrines was obtained and was elevated but CT was neg for adrenal abnormalities.  FU urine demonstrated normal values.  Echo demonstrated normal ejection fraction.  24 hr BP monitor demonstrated avg BP 130/85 and HR 97.  She was referred to Dr. Virl Axe. Verapamil was tried but DC'd 2/2 constipation.  Last seen in clinic by Chanetta Marshall, NP in 10/16.  She was improved on Diltiazem and off of Nortriptyline.  Sleep study was positive for obstructive sleep apnea.    Patient was seen in the emergency room yesterday with palpitations and chest pain.   Heart rate was in the 140s. Hemoglobin and Creatinine were normal. Potassium was slightly low at 3.4. Troponin was negative. Chest x-ray was unremarkable. Chest CT was negative for pulmonary embolism. TSH was normal. Heart rate improved with IV diltiazem and IV fluids.  She presents today alone. She was put on Prozac at the beginning of March for depression. She had several symptoms felt to be side effects and this was discontinued after about 10 days of therapy. She was seen in the emergency room. There was a question whether or not she had serotonin syndrome. She had not really noticed her heart rate being elevated until 2 days ago  which prompted her visit to the emergency room. She had chest discomfort with her elevated heart rate as well as left arm symptoms. She describes pleuritic chest pain as well. She denies hemoptysis. She has noted a fever of 100.5 as well as chills, sore throat, congestion and otalgia. She feels exhausted and notes shortness of breath with any activity. Her exhaustion has significantly worsened since she went to the emergency room yesterday. She thinks that she may have the flu. She denies orthopnea, PND or significant edema. She denies syncope. She does not smoke.  Prior CV studies:   The following studies were reviewed today:  Echo 08/29/14 EF 60-65, normal wall motion, mild LAE  Past Medical History:  Diagnosis Date  . Allergy   . Anxiety   . B12 deficiency   . Depression    not currently   . Dysrhythmia    hx tachy-was from medication nortriptylline  . Esophagitis    rx  . Fibromyalgia   . GERD (gastroesophageal reflux disease)   . History of kidney stones   . Hyperlipidemia   . Intervertebral disc protrusion 01/11/2014  . Migraine   . Obesity (BMI 35.0-39.9 without comorbidity)   . Peripheral neuropathy ([facility])   . Polycystic ovaries   . Spinal stenosis, lumbar     Past Surgical History:  Procedure Laterality Date  . [place] STUDY N/A 05/27/2016   Procedure: [place] STUDY;  Surgeon: Manus Gunning, MD;  Location:  [facility];  Service: Gastroenterology;  Laterality: N/A;  . APPENDECTOMY    . BACK SURGERY  2012   left laminectomy at L3-4 per Dr. Luiz Ochoa   . bladder tack  2012  . CHOLECYSTECTOMY N/A 10/03/2015   Procedure: LAPAROSCOPIC CHOLECYSTECTOMY;  Surgeon: Mickeal Skinner, MD;  Location: [place] [facility];  Service: General;  Laterality: N/A;  . ESOPHAGEAL MANOMETRY N/A 05/27/2016   Procedure: ESOPHAGEAL MANOMETRY (EM);  Surgeon: Manus Gunning, MD;  Location: [facility];  Service: Gastroenterology;  Laterality: N/A;  . EXCISION OF SKIN TAG  10/03/2015    Procedure: EXCISION OF SKIN TAG;  Surgeon: Arta Bruce Kinsinger, MD;  Location: [place] [facility];  Service: General;;  . LUMBAR [place] SURGERY  01-31-14   per Dr. Saintclair Halsted   . OOPHORECTOMY     left overy  . OVARIAN CYST REMOVAL    . RECTOCELE REPAIR    . SINUSOTOMY  12/14/06  . spinal fusion  2016/10  . TONSILLECTOMY    . TONSILLECTOMY    . VAGINAL HYSTERECTOMY    . WRIST GANGLION EXCISION Left     Current Medications: Current Meds  Medication Sig  . ALPRAZolam (XANAX) 0.5 MG tablet Take 1 tablet (0.5 mg total) by mouth 3 (three) times daily as needed for anxiety.  . BD INTEGRA SYRINGE 25G X 1" 3 ML MISC USE AS DIRECTED  . calcium carbonate (TUMS - DOSED IN MG ELEMENTAL CALCIUM) 500 MG chewable tablet Chew 1 tablet by mouth 2 (two) times daily as needed for indigestion or heartburn.  . cyanocobalamin (,VITAMIN B-12,) 1000 MCG/ML injection INJECT 1 ML (1,000 MCG TOTAL) INTO THE MUSCLE EVERY 7 DAYS.  . fluconazole (DIFLUCAN) 150 MG tablet TAKE 1 TABLET (150 MG TOTAL) BY MOUTH DAILY.  . fluticasone (FLONASE) 50 MCG/ACT nasal spray Place 2 sprays into both nostrils daily as needed for allergies.   Marland Kitchen gabapentin (NEURONTIN) 100 MG capsule Take 100-200 mg by mouth 3 (three) times daily as needed.   . gabapentin (NEURONTIN) 300 MG capsule TAKE ONE CAPSULE BY MOUTH 3 TIMES A DAY  . Multiple Vitamins-Minerals (MULTI-VITAMIN GUMMIES PO) Take 2 tablets by mouth daily.   Marland Kitchen oxyCODONE-acetaminophen (PERCOCET) 10-325 MG per tablet Take 1 tablet by mouth every 6 (six) hours as needed for pain.  . phentermine 37.5 MG capsule Take 1 capsule (37.5 mg total) by mouth every morning.  . potassium chloride (KLOR-CON 10) 10 MEQ tablet Take 1 tablet (10 mEq total) by mouth daily.  . promethazine (PHENERGAN) 25 MG tablet TAKE 1 TABLET BY MOUTH EVERY 6 HOURS AS NEEDED FOR NAUSEA  . rosuvastatin (CRESTOR) 10 MG tablet Take 1 tablet (10 mg total) by mouth daily.  . simethicone (MYLICON) 80 MG chewable tablet Chew 160 mg by mouth  every 6 (six) hours as needed (gas pain).  . sucralfate (CARAFATE) 1 GM/10ML suspension Take 10 mLs (1 g total) by mouth 4 (four) times daily -  with meals and at bedtime.  . SUMAtriptan (IMITREX) 50 MG tablet TAKE 1 TABLET BY MOUTH AT ONSET OF HEADACHE MAY REPEAT ONCE IN 2 HOURS IF PERSISTS OR RECURS  . Vitamin D, Ergocalciferol, (DRISDOL) 50000 units CAPS capsule Take 1 capsule (50,000 Units total) by mouth every 7 (seven) days.  . [DISCONTINUED] diltiazem (CARDIZEM) 60 MG tablet Take 1 tablet (60 mg total) by mouth 2 (two) times daily. 1 tablet 2 times daily. If tolerated can increase to three times daily as needed.  . [DISCONTINUED] hydrochlorothiazide (HYDRODIURIL) 25 MG tablet TAKE 1  TABLET BY MOUTH EVERY DAY     Allergies:   Gadolinium derivatives and Zoloft [sertraline hcl]   Social History   Social History  . Marital status: Married    Spouse name: N/A  . Number of children: 2  . Years of education: N/A   Occupational History  . housewife    Social History Main Topics  . Smoking status: Former Smoker    Types: Cigarettes    Quit date: 06/01/1998  . Smokeless tobacco: Never Used  . Alcohol use No  . Drug use: No  . Sexual activity: Yes    Birth control/ protection: Surgical   Other Topics Concern  . None   Social History Narrative  . None     Family History  Problem Relation Age of Onset  . Fibromyalgia Mother   . Diabetes Mother   . Thyroid cancer Mother   . Liver disease Mother   . Neuropathy Mother   . Cirrhosis Mother     non-alcoholic  . Pulmonary embolism Mother     both lungs  . Hypertension Father   . Diabetes Father   . Heart disease Father   . Prostate cancer Father   . Heart attack Father     x 2  . Colon cancer Neg Hx   . Other Neg Hx     pheochromocytoma  . Colon polyps Neg Hx      ROS:   Please see the history of present illness.    ROS All other systems reviewed and are negative.   EKGs/Labs/Other Test Reviewed:    EKG:  EKG is   ordered today.  The ekg ordered today demonstrates Sinus tachycardia, HR 121, normal axis, QTc 465 ms, no significant ST-T wave changes  Recent Labs: 08/15/2016: ALT 15 09/03/2016: BUN 14; Creatinine, Ser 0.93; Hemoglobin 14.1; Platelets 279; Potassium 3.4; Sodium 137; TSH 2.022   Recent Lipid Panel    Component Value Date/Time   CHOL 248 (H) 03/24/2016 1014   TRIG 196.0 (H) 03/24/2016 1014   HDL 38.50 (L) 03/24/2016 1014   CHOLHDL 6 03/24/2016 1014   VLDL 39.2 03/24/2016 1014   LDLCALC 170 (H) 03/24/2016 1014   LDLDIRECT 162.0 03/27/2015 0953     Physical Exam:    VS:  BP 120/80   Pulse (!) 121   Ht _0  (1.753 m)   Wt 268 lb (121.6 kg)   LMP 12/13/1999 Comment: single oophorectomy//a.c.  BMI 39.58 kg/m     Wt Readings from Last 3 Encounters:  09/04/16 268 lb (121.6 kg)  09/03/16 260 lb (117.9 kg)  08/15/16 257 lb (116.6 kg)     Physical Exam  Constitutional: She is oriented to person, place, and time. She appears well-developed and well-nourished. No distress.  HENT:  Head: Normocephalic and atraumatic.  Eyes: No scleral icterus.  Neck: Normal range of motion. No JVD present. No thyromegaly present.  Cardiovascular: Regular rhythm, S1 normal, S2 normal and normal heart sounds.  Tachycardia present.   No murmur heard. Pulmonary/Chest: Breath sounds normal. She has no wheezes. She has no rhonchi. She has no rales.  Abdominal: Soft. There is no tenderness.  Musculoskeletal: She exhibits no edema.  Neurological: She is alert and oriented to person, place, and time.  Skin: Skin is warm and dry.  Psychiatric: She has a normal mood and affect.    ASSESSMENT:    1. Sinus tachycardia   2. Other chest pain   3. Essential hypertension   4. Viral  illness    PLAN:    In order of problems listed above:  1. Sinus tachycardia -  Her sinus tachycardia is likely multifactorial and related to recent side effects with SSRI and then discontinuing it suddenly as well as what  sounds like a viral illness exacerbated by daily thiazide diuretic use.  She previously improved off of her TCA (nortriptyline).  She is back on Diltiazem due to esophageal issues.  Recent Hgb, TSH normal and CT was neg for PE.    -  Refer to PCP for flu testing today  -  Increase Dilt to 60 mg TID.  She can reduce to 30 mg TID if BP runs low.  -  Decrease HCTZ to 12.5 mg QD for now to allow for hydration  -  Push fluids.   2. Other chest pain - Troponin neg yesterday.  ECG is unremarkable, aside from sinus tachycardia.  Chest pain is Atypical and I suspect symptomatic from sinus tachy.  Increase calcium channel blocker dose as noted.  Arrange echocardiogram.  FU in 3-4 weeks.  If chest pain continues, consider stress testing.   3. Essential hypertension - BP is controlled.   4. Viral illness - I am s/w concerned she may have the flu.  We do not have the test in our office.  Primary care can see her later today.  -  See PCP later today.    Dispo:  Return in about 4 weeks (around 10/02/2016) for Close Follow Up, w/ Richardson Dopp, PA-C.   Medication Adjustments/Labs and Tests Ordered: Current medicines are reviewed at length with the patient today.  Concerns regarding medicines are outlined above.  Medication changes, Labs and Tests ordered today are outlined in the Patient Instructions noted below. Patient Instructions  Medication Instructions:  1. DECREASE HCTZ TO 1/2 TAB = 12.5 MG DAILY UNTIL FEELING BETTER   2. INCREASE DILTIAZEM TO 60 MG EVERY 8 HOURS ; IF THIS MAKES YOU FEEL LIGHTHEADED, DIZZY OR IF YOUR BLOOD PRESSURE IS TOO LOW; THEN OK TO CHANGE TO TAKE 1/2 TABLET = 30 MG EVERY 8 HOURS  Labwork: NONE OREDERED  Testing/Procedures: Your physician has requested that you have an echocardiogram. Echocardiography is a painless test that uses sound waves to create images of your heart. It provides your doctor with information about the size and shape of your heart and how well your  heart's chambers and valves are working. This procedure takes approximately one hour. There are no restrictions for this procedure.  Follow-Up: 1. YOU HAVE AN APPT TODAY WITH DR. Maudie Mercury AT 2 PM 2. [place]onia Leap, PAC IN 3-4 WEEKS   Any Other Special Instructions Will Be Listed Below (If Applicable). MAKE SURE TO INCREASE WATER, GATORADE, JUICE INTAKE  If you need a refill on your cardiac medications before your next appointment, please call your pharmacy.  Signed, Richardson Dopp, PA-C  09/04/2016 1:40 PM    [facility] [address], [place], [place]  [address] Phone: [phone]; Fax: [phone]

## 2016-09-04 NOTE — Progress Notes (Signed)
Pre visit review using our clinic review tool, if applicable. No additional management support is needed unless otherwise documented below in the visit note. 

## 2016-09-04 NOTE — Patient Instructions (Addendum)
Medication Instructions:  1. DECREASE HCTZ TO 1/2 TAB = 12.5 MG DAILY UNTIL FEELING BETTER   2. INCREASE DILTIAZEM TO 60 MG EVERY 8 HOURS ; IF THIS MAKES YOU FEEL LIGHTHEADED, DIZZY OR IF YOUR BLOOD PRESSURE IS TOO LOW; THEN OK TO CHANGE TO TAKE 1/2 TABLET = 30 MG EVERY 8 HOURS  Labwork: NONE OREDERED  Testing/Procedures: Your physician has requested that you have an echocardiogram. Echocardiography is a painless test that uses sound waves to create images of your heart. It provides your doctor with information about the size and shape of your heart and how well your heart's chambers and valves are working. This procedure takes approximately one hour. There are no restrictions for this procedure.  Follow-Up: 1. YOU HAVE AN APPT TODAY WITH DR. Selena Batten AT 2 PM 2. SCOTT WEAVER, PAC IN 3-4 WEEKS   Any Other Special Instructions Will Be Listed Below (If Applicable). MAKE SURE TO INCREASE WATER, GATORADE, JUICE INTAKE  If you need a refill on your cardiac medications before your next appointment, please call your pharmacy.

## 2016-09-04 NOTE — Progress Notes (Signed)
HPI:   Acute visit for ? Flu: -symptoms started acutely 1.5 days ago -symptoms include: nasal congestion, PND, cough, fever (highest 100.5), nausea, fatigue -saw her cardiologist in follow up for palpitations today(note: known hx tachycardia) - she requested flu test there as wants tamiflu if positive, but they did not have the test so sent her here (of note extensive eval in hospital yesterday for palpitations and SOB - neg CXR and neg CT angio), TSH, CBC and trop neg -on dilt since yesterday for the tachycardia -no tick bite, SOB today, rash, vomiting, diarrhea, inability to tol fluids -anxious about all of this, hx anxiety -she stopped phentermine and SSRI recently due to side effects - reports sensitive to meds  ROS: See pertinent positives and negatives per HPI.  Past Medical History:  Diagnosis Date  . Allergy   . Anxiety   . B12 deficiency   . Depression    not currently   . Dysrhythmia    hx tachy-was from medication nortriptylline  . Esophagitis    rx  . Fibromyalgia   . GERD (gastroesophageal reflux disease)   . History of kidney stones   . Hyperlipidemia   . Intervertebral disc protrusion 01/11/2014  . Migraine   . Obesity (BMI 35.0-39.9 without comorbidity)   . Peripheral neuropathy ([facility])   . Polycystic ovaries   . Spinal stenosis, lumbar     Past Surgical History:  Procedure Laterality Date  . 24 HOUR PH STUDY N/A 05/27/2016   Procedure: 24 HOUR PH STUDY;  Surgeon: Ruffin Frederick, MD;  Location: [facility];  Service: Gastroenterology;  Laterality: N/A;  . APPENDECTOMY    . BACK SURGERY  2012   left laminectomy at L3-4 per Dr. Phoebe Perch   . bladder tack  2012  . CHOLECYSTECTOMY N/A 10/03/2015   Procedure: LAPAROSCOPIC CHOLECYSTECTOMY;  Surgeon: Rodman Pickle, MD;  Location: [facility];  Service: General;  Laterality: N/A;  . ESOPHAGEAL MANOMETRY N/A 05/27/2016   Procedure: ESOPHAGEAL MANOMETRY (EM);  Surgeon: Ruffin Frederick, MD;  Location:  [facility];  Service: Gastroenterology;  Laterality: N/A;  . EXCISION OF SKIN TAG  10/03/2015   Procedure: EXCISION OF SKIN TAG;  Surgeon: De Blanch Kinsinger, MD;  Location: [facility];  Service: General;;  . LUMBAR DISC SURGERY  01-31-14   per Dr. [name]   . OOPHORECTOMY     left overy  . OVARIAN CYST REMOVAL    . RECTOCELE REPAIR    . SINUSOTOMY  12/14/06  . spinal fusion  2016/10  . TONSILLECTOMY    . TONSILLECTOMY    . VAGINAL HYSTERECTOMY    . WRIST GANGLION EXCISION Left     Family History  Problem Relation Age of Onset  . Fibromyalgia Mother   . Diabetes Mother   . Thyroid cancer Mother   . Liver disease Mother   . Neuropathy Mother   . Cirrhosis Mother     non-alcoholic  . Pulmonary embolism Mother     both lungs  . Hypertension Father   . Diabetes Father   . Heart disease Father   . Prostate cancer Father   . Heart attack Father     x 2  . Colon cancer Neg Hx   . Other Neg Hx     pheochromocytoma  . Colon polyps Neg Hx     Social History   Social History  . Marital status: Married    Spouse name: N/A  . Number of children: 2  . Years  of education: N/A   Occupational History  . housewife    Social History Main Topics  . Smoking status: Former Smoker    Types: Cigarettes    Quit date: 06/01/1998  . Smokeless tobacco: Never Used  . Alcohol use No  . Drug use: No  . Sexual activity: Yes    Birth control/ protection: Surgical   Other Topics Concern  . None   Social History Narrative  . None     Current Outpatient Prescriptions:  .  ALPRAZolam (XANAX) 0.5 MG tablet, Take 1 tablet (0.5 mg total) by mouth 3 (three) times daily as needed for anxiety., Disp: 90 tablet, Rfl: 5 .  BD INTEGRA SYRINGE 25G X 1" 3 ML MISC, USE AS DIRECTED, Disp: 50 each, Rfl: 1 .  calcium carbonate (TUMS - DOSED IN MG ELEMENTAL CALCIUM) 500 MG chewable tablet, Chew 1 tablet by mouth 2 (two) times daily as needed for indigestion or heartburn., Disp: , Rfl:  .  cyanocobalamin  (,VITAMIN B-12,) 1000 MCG/ML injection, INJECT 1 ML (1,000 MCG TOTAL) INTO THE MUSCLE EVERY 7 DAYS., Disp: 12 mL, Rfl: 3 .  diltiazem (CARDIZEM SR) 60 MG 12 hr capsule, Take 1 capsule (60 mg total) by mouth 3 (three) times daily., Disp: 270 capsule, Rfl: 3 .  fluconazole (DIFLUCAN) 150 MG tablet, TAKE 1 TABLET (150 MG TOTAL) BY MOUTH DAILY., Disp: 5 tablet, Rfl: 5 .  fluticasone (FLONASE) 50 MCG/ACT nasal spray, Place 2 sprays into both nostrils daily as needed for allergies. , Disp: , Rfl:  .  gabapentin (NEURONTIN) 100 MG capsule, Take 100-200 mg by mouth 3 (three) times daily as needed. , Disp: , Rfl: 0 .  gabapentin (NEURONTIN) 300 MG capsule, TAKE ONE CAPSULE BY MOUTH 3 TIMES A DAY, Disp: 90 capsule, Rfl: 3 .  hydrochlorothiazide (HYDRODIURIL) 25 MG tablet, Take 0.5 tablets (12.5 mg total) by mouth daily., Disp: 90 tablet, Rfl: 3 .  Multiple Vitamins-Minerals (MULTI-VITAMIN GUMMIES PO), Take 2 tablets by mouth daily. , Disp: , Rfl:  .  oxyCODONE-acetaminophen (PERCOCET) 10-325 MG per tablet, Take 1 tablet by mouth every 6 (six) hours as needed for pain., Disp: 60 tablet, Rfl: 0 .  potassium chloride (KLOR-CON 10) 10 MEQ tablet, Take 1 tablet (10 mEq total) by mouth daily., Disp: 90 tablet, Rfl: 3 .  promethazine (PHENERGAN) 25 MG tablet, TAKE 1 TABLET BY MOUTH EVERY 6 HOURS AS NEEDED FOR NAUSEA, Disp: 90 tablet, Rfl: 1 .  rosuvastatin (CRESTOR) 10 MG tablet, Take 1 tablet (10 mg total) by mouth daily., Disp: 30 tablet, Rfl: 2 .  simethicone (MYLICON) 80 MG chewable tablet, Chew 160 mg by mouth every 6 (six) hours as needed (gas pain)., Disp: , Rfl:  .  sucralfate (CARAFATE) 1 GM/10ML suspension, Take 10 mLs (1 g total) by mouth 4 (four) times daily -  with meals and at bedtime., Disp: 420 mL, Rfl: 2 .  SUMAtriptan (IMITREX) 50 MG tablet, TAKE 1 TABLET BY MOUTH AT ONSET OF HEADACHE MAY REPEAT ONCE IN 2 HOURS IF PERSISTS OR RECURS, Disp: 9 tablet, Rfl: 2 .  Vitamin D, Ergocalciferol, (DRISDOL)  50000 units CAPS capsule, Take 1 capsule (50,000 Units total) by mouth every 7 (seven) days., Disp: 12 capsule, Rfl: 3 .  benzonatate (TESSALON) 100 MG capsule, Take 1 capsule (100 mg total) by mouth 2 (two) times daily., Disp: 20 capsule, Rfl: 0 .  oseltamivir (TAMIFLU) 75 MG capsule, Take 1 capsule (75 mg total) by mouth 2 (two) times daily.,  Disp: 10 capsule, Rfl: 0  EXAM:  Vitals:   09/04/16 1411  BP: 116/76  Pulse: 100  Temp: 98.4 F (36.9 C)    Body mass index is 39.77 kg/m.  GENERAL: vitals reviewed and listed above, alert, oriented, appears well hydrated and in no acute distress  HEENT: atraumatic, conjunttiva clear, no obvious abnormalities on inspection of external nose and ears, normal appearance of ear canals and TMs, clear nasal congestion, mild post oropharyngeal erythema with PND, no tonsillar edema or exudate, no sinus TTP  NECK: no obvious masses on inspection  LUNGS: clear to auscultation bilaterally, no wheezes, rales or rhonchi, good air movement  CV: HRRR, no peripheral edema  MS: moves all extremities without noticeable abnormality  PSYCH: pleasant and cooperative, no obvious depression or anxiety  ASSESSMENT AND PLAN:  Discussed the following assessment and plan:  Viral upper respiratory tract infection - Plan: POC Influenza A&B(BINAX/QUICKVUE)  Tachycardia  Chronic tachycardia  ANXIETY DISORDER, GENERALIZED  Ful test positive. We discussed risks/benefits/indications/best timing for tamiflu, likely course, transmission, potential complications, signs of developing a serious illness and return and emergency precuations. She wants to start tamiflu.  She is seeing the cardiologist about her tachycardia - seems like this is a chronic recurrent issue.  Advised f/u with PCP regarding anxiety and medication intolerances. Advises caution with any future use of phentermine and to discuss with PCP and cardiology prior to reinitiating.  -Patient advised to  return or notify a doctor immediately if symptoms worsen or persist or new concerns arise.  Patient Instructions  Take the tamiflu as instructed.  Plenty of fluids.  Tessalon for cough as needed.  Follow up with your heart doctor if any heart concerns and as planned.  I hope you are feeling better soon! Seek care immediately if worsening, new concerns or you are not improving with treatment.  Influenza, Adult Influenza ("the flu") is an infection in the lungs, nose, and throat (respiratory tract). It is caused by a virus. The flu causes many common cold symptoms, as well as a high fever and body aches. It can make you feel very sick. The flu spreads easily from person to person (is contagious). Getting a flu shot (influenza vaccination) every year is the best way to prevent the flu. Follow these instructions at home:  Take over-the-counter and prescription medicines only as told by your doctor.  Use a cool mist humidifier to add moisture (humidity) to the air in your home. This can make it easier to breathe.  Rest as needed.  Drink enough fluid to keep your pee (urine) clear or pale yellow.  Cover your mouth and nose when you cough or sneeze.  Wash your hands with soap and water often, especially after you cough or sneeze. If you cannot use soap and water, use hand sanitizer.  Stay home from work or school as told by your doctor. Unless you are visiting your doctor, try to avoid leaving home until your fever has been gone for 24 hours without the use of medicine.  Keep all follow-up visits as told by your doctor. This is important. How is this prevented?  Getting a yearly (annual) flu shot is the best way to avoid getting the flu. You may get the flu shot in late summer, fall, or winter. Ask your doctor when you should get your flu shot.  Wash your hands often or use hand sanitizer often.  Avoid contact with people who are sick during cold and flu season.  Eat  healthy  foods.  Drink plenty of fluids.  Get enough sleep.  Exercise regularly.  Get help right away if:  You start to be short of breath or have trouble breathing.  Your skin or nails turn a bluish color.  You have very bad pain or stiffness in your neck.  You get a sudden headache.  You get sudden pain in your face or ear.  You cannot stop throwing up. This information is not intended to replace advice given to you by your health care provider. Make sure you discuss any questions you have with your health care provider. Document Released: 02/25/2008 Document Revised: 10/24/2015 Document Reviewed: 03/12/2015 Elsevier Interactive Patient Education  [address].     Ramona, Dahlia Client R., DO

## 2016-09-08 ENCOUNTER — Encounter: Payer: Self-pay | Admitting: Physician Assistant

## 2016-09-08 ENCOUNTER — Ambulatory Visit: Payer: BLUE CROSS/BLUE SHIELD | Admitting: Licensed Clinical Social Worker

## 2016-09-14 ENCOUNTER — Other Ambulatory Visit: Payer: Self-pay | Admitting: Family Medicine

## 2016-09-17 ENCOUNTER — Ambulatory Visit (HOSPITAL_COMMUNITY): Payer: BLUE CROSS/BLUE SHIELD | Attending: Cardiology

## 2016-09-17 ENCOUNTER — Other Ambulatory Visit: Payer: Self-pay

## 2016-09-17 DIAGNOSIS — I10 Essential (primary) hypertension: Secondary | ICD-10-CM | POA: Diagnosis not present

## 2016-09-17 DIAGNOSIS — R000 Tachycardia, unspecified: Secondary | ICD-10-CM | POA: Insufficient documentation

## 2016-09-17 DIAGNOSIS — Z6839 Body mass index (BMI) 39.0-39.9, adult: Secondary | ICD-10-CM | POA: Diagnosis not present

## 2016-09-17 DIAGNOSIS — F329 Major depressive disorder, single episode, unspecified: Secondary | ICD-10-CM | POA: Insufficient documentation

## 2016-09-17 DIAGNOSIS — E669 Obesity, unspecified: Secondary | ICD-10-CM | POA: Diagnosis not present

## 2016-09-18 ENCOUNTER — Encounter: Payer: Self-pay | Admitting: Physician Assistant

## 2016-09-18 ENCOUNTER — Telehealth: Payer: Self-pay | Admitting: *Deleted

## 2016-09-18 NOTE — Telephone Encounter (Signed)
Pt notified of echo results by phone with verbal understanding. Pt then asked what is causing her heart rate to go fast sometimes. I advised pt to keep her appt next week Bing Neighbors. PA to discuss further. Pt did state to me that she has 1 mountain dew a day. I advised pt that is one of the very worst sodas to have, very high in caffeine. I asked pt to limit or stop mountain dew until her appt next week and keep a diary of her heart rate without any mountain dew. Pt is agreeable to this plan of care. Pt will bring readings to appt.

## 2016-09-18 NOTE — Telephone Encounter (Signed)
-----   Message from Beatrice Lecher, [place] sent at 09/18/2016  1:14 PM EDT ----- Please call the patient. The echocardiogram shows normal LV function and just mildly impaired relaxation. Continue current treatment plan. Please fax a copy of this study result to her PCP:  Gershon Crane, MD  Thanks! Tereso Newcomer, PA-C    09/18/2016 1:09 PM

## 2016-09-23 ENCOUNTER — Ambulatory Visit (INDEPENDENT_AMBULATORY_CARE_PROVIDER_SITE_OTHER): Payer: BLUE CROSS/BLUE SHIELD | Admitting: Licensed Clinical Social Worker

## 2016-09-23 DIAGNOSIS — F3341 Major depressive disorder, recurrent, in partial remission: Secondary | ICD-10-CM | POA: Diagnosis not present

## 2016-09-24 ENCOUNTER — Telehealth: Payer: Self-pay

## 2016-09-24 NOTE — Telephone Encounter (Signed)
She can take 5 days of Macrobid bid. If she still has symptoms at that point, she should have an OV

## 2016-09-24 NOTE — Telephone Encounter (Signed)
Patient called with c/o possible UTI. She reports pain, burning and sensation of frequency of urination. She denies f/n/v, discolored or blood in urine. She has Macrobid given by Gyn to take 1 tab prn post intercourse to avoid UTI's. She forgot to take it this past weekend and now is symptomatic. She has taken 2 Macrobid tablets since last night with mild improvement in symptoms. She would like to know if she should continue Macrobid (she has #10 left with refills of #30) or if there is something else she should take.   Dr. Clent Ridges - Please advise. Thanks!

## 2016-09-24 NOTE — Telephone Encounter (Signed)
Spoke with pt and advised. She will take Macrobid for 5 days and if still symptomatic will call office for appt. Nothing further needed at this time.

## 2016-09-25 ENCOUNTER — Encounter: Payer: Self-pay | Admitting: Physician Assistant

## 2016-09-25 ENCOUNTER — Ambulatory Visit (INDEPENDENT_AMBULATORY_CARE_PROVIDER_SITE_OTHER): Payer: BLUE CROSS/BLUE SHIELD | Admitting: Physician Assistant

## 2016-09-25 VITALS — BP 120/94 | HR 105 | Ht 69.0 in | Wt 269.0 lb

## 2016-09-25 DIAGNOSIS — I10 Essential (primary) hypertension: Secondary | ICD-10-CM | POA: Diagnosis not present

## 2016-09-25 DIAGNOSIS — M545 Low back pain: Secondary | ICD-10-CM

## 2016-09-25 DIAGNOSIS — M4807 Spinal stenosis, lumbosacral region: Secondary | ICD-10-CM | POA: Diagnosis not present

## 2016-09-25 DIAGNOSIS — M5137 Other intervertebral disc degeneration, lumbosacral region: Secondary | ICD-10-CM | POA: Diagnosis not present

## 2016-09-25 DIAGNOSIS — R000 Tachycardia, unspecified: Secondary | ICD-10-CM

## 2016-09-25 NOTE — Progress Notes (Signed)
Cardiology Office Note:    Date:  09/25/2016   ID:  [name], DOB [birth year], MRN [id number]  PCP:  Alysia Penna, MD  Cardiologist:  Dr. Jenkins Rouge           Electrophysiologist:  Dr. Virl Axe   Referring MD: Laurey Morale, MD   Chief Complaint  Patient presents with  . Palpitations    History of Present Illness:    [name] is a 41 y.o. female with a hx of labile HTN, sinus tachycardia, depression, obesity, OSA.  She was initially evaluated by Dr. Jenkins Rouge in 2/16.  Prior workup demonstrated normal TSH, hemoglobin.  ESR was elevated but w/u for connective tissue disease was negative.  CTA was neg for pulmonary embolism.  She had side effects to beta-blocker (fatigue, hair loss).  Urine for catecholamines/metanephrines was obtained and was elevated but CT was neg for adrenal abnormalities.  FU urine demonstrated normal values.  Echo demonstrated normal ejection fraction.  24 hr BP monitor demonstrated avg BP 130/85 and HR 97.  She was referred to Dr. Virl Axe. Verapamil was tried but DC'd 2/2 constipation.  She did improve after her PCP stopped her Nortriptyline.    I saw her earlier this month after going to the emergency room with palpitations and chest pain. Troponin was negative. CT was negative for PE. Symptoms seemed to have started after abruptly stopping Prozac.  She had symptoms of the flu at her visit with me and I had her see primary care the same day.  Flu test was +.    She returns for follow up. She is here with her husband.  She feels better.  She still has a lot of palpitations and a lot of concerns.  She struggles with back pain and has had spinal fusion in the past.  She was at the neurosurgeon's office today and they plan another ESI and nerve ablation.  She takes a lot of pain medication b/c of her back pain and leg pain.  She has felt better when she skips her calcium channel blocker.  It was given to her for esophageal spasm. She has not had  further chest pain other than feeling her HR elevation. She denies syncope.  She denies dyspnea on exertion but has been out of breath at times.    Prior CV studies:   The following studies were reviewed today:  Echo 09/17/16 Mild conc LVH, EF 65-70, Gr 1 DD  Echo 08/29/14 EF 60-65, normal wall motion, mild LAE  Past Medical History:  Diagnosis Date  . Allergy   . Anxiety   . B12 deficiency   . Depression    not currently   . Dysrhythmia    hx tachy-was from medication nortriptylline  . Esophagitis    rx  . Fibromyalgia   . GERD (gastroesophageal reflux disease)   . History of echocardiogram    Echo 4/18: Mild concentric LVH, vigorous LVF, EF 65-70, normal wall motion, grade 1 diastolic dysfunction  . History of kidney stones   . Hyperlipidemia   . Intervertebral disc protrusion 01/11/2014  . Migraine   . Obesity (BMI 35.0-39.9 without comorbidity)   . Peripheral neuropathy   . Polycystic ovaries   . Spinal stenosis, lumbar     Past Surgical History:  Procedure Laterality Date  . [place] STUDY N/A 05/27/2016   Procedure: [place] STUDY;  Surgeon: Manus Gunning, MD;  Location: [facility];  Service: Gastroenterology;  Laterality:  N/A;  . APPENDECTOMY    . BACK SURGERY  2012   left laminectomy at L3-4 per Dr. Luiz Ochoa   . bladder tack  2012  . CHOLECYSTECTOMY N/A 10/03/2015   Procedure: LAPAROSCOPIC CHOLECYSTECTOMY;  Surgeon: Mickeal Skinner, MD;  Location: [facility];  Service: General;  Laterality: N/A;  . ESOPHAGEAL MANOMETRY N/A 05/27/2016   Procedure: ESOPHAGEAL MANOMETRY (EM);  Surgeon: Manus Gunning, MD;  Location: [facility];  Service: Gastroenterology;  Laterality: N/A;  . EXCISION OF SKIN TAG  10/03/2015   Procedure: EXCISION OF SKIN TAG;  Surgeon: Arta Bruce Kinsinger, MD;  Location: [facility];  Service: General;;  . LUMBAR [place] SURGERY  01-31-14   per Dr. Saintclair Halsted   . OOPHORECTOMY     left overy  . OVARIAN CYST REMOVAL    . RECTOCELE REPAIR      . SINUSOTOMY  12/14/06  . spinal fusion  2016/10  . TONSILLECTOMY    . TONSILLECTOMY    . VAGINAL HYSTERECTOMY    . WRIST GANGLION EXCISION Left     Current Medications: Current Meds  Medication Sig  . ALPRAZolam (XANAX) 0.5 MG tablet Take 1 tablet (0.5 mg total) by mouth 3 (three) times daily as needed for anxiety.  . BD INTEGRA SYRINGE 25G X 1" 3 ML MISC USE AS DIRECTED  . calcium carbonate (TUMS - DOSED IN MG ELEMENTAL CALCIUM) 500 MG chewable tablet Chew 1 tablet by mouth 2 (two) times daily as needed for indigestion or heartburn.  . cyanocobalamin (,VITAMIN B-12,) 1000 MCG/ML injection INJECT 1 ML (1,000 MCG TOTAL) INTO THE MUSCLE EVERY 7 DAYS.  Marland Kitchen diltiazem (CARDIZEM SR) 60 MG 12 hr capsule Take 60 mg by mouth 2 (two) times daily.  . fluticasone (FLONASE) 50 MCG/ACT nasal spray Place 2 sprays into both nostrils daily as needed for allergies.   Marland Kitchen gabapentin (NEURONTIN) 100 MG capsule Take 100-200 mg by mouth 3 (three) times daily as needed.   . gabapentin (NEURONTIN) 300 MG capsule TAKE ONE CAPSULE BY MOUTH 3 TIMES A DAY  . hydrochlorothiazide (HYDRODIURIL) 25 MG tablet Take 0.5 tablets (12.5 mg total) by mouth daily.  . Multiple Vitamins-Minerals (MULTI-VITAMIN GUMMIES PO) Take 2 tablets by mouth daily.   Marland Kitchen oxyCODONE-acetaminophen (PERCOCET) 10-325 MG per tablet Take 1 tablet by mouth every 6 (six) hours as needed for pain.  . potassium chloride (KLOR-CON 10) 10 MEQ tablet Take 1 tablet (10 mEq total) by mouth daily.  . promethazine (PHENERGAN) 25 MG tablet TAKE 1 TABLET BY MOUTH EVERY 6 HOURS AS NEEDED FOR NAUSEA  . rosuvastatin (CRESTOR) 10 MG tablet Take 1 tablet (10 mg total) by mouth daily.  . simethicone (MYLICON) 80 MG chewable tablet Chew 160 mg by mouth every 6 (six) hours as needed (gas pain).  . sucralfate (CARAFATE) 1 GM/10ML suspension Take 10 mLs (1 g total) by mouth 4 (four) times daily -  with meals and at bedtime.  . SUMAtriptan (IMITREX) 50 MG tablet TAKE 1 TABLET  BY MOUTH AT ONSET OF HEADACHE MAY REPEAT ONCE IN 2 HOURS IF PERSISTS OR RECURS  . Vitamin D, Ergocalciferol, (DRISDOL) 50000 units CAPS capsule Take 1 capsule (50,000 Units total) by mouth every 7 (seven) days.     Allergies:   Gadolinium derivatives and Zoloft [sertraline hcl]   Social History   Social History  . Marital status: Married    Spouse name: N/A  . Number of children: 2  . Years of education: N/A   Occupational History  .  housewife    Social History Main Topics  . Smoking status: Former Smoker    Types: Cigarettes    Quit date: 06/01/1998  . Smokeless tobacco: Never Used  . Alcohol use No  . Drug use: No  . Sexual activity: Yes    Birth control/ protection: Surgical   Other Topics Concern  . None   Social History Narrative  . None     Family History  Problem Relation Age of Onset  . Fibromyalgia Mother   . Diabetes Mother   . Thyroid cancer Mother   . Liver disease Mother   . Neuropathy Mother   . Cirrhosis Mother     non-alcoholic  . Pulmonary embolism Mother     both lungs  . Hypertension Father   . Diabetes Father   . Heart disease Father   . Prostate cancer Father   . Heart attack Father     x 2  . Colon cancer Neg Hx   . Other Neg Hx     pheochromocytoma  . Colon polyps Neg Hx      ROS:   Please see the history of present illness.    ROS All other systems reviewed and are negative.   EKGs/Labs/Other Test Reviewed:    EKG:  EKG is  ordered today.  The ekg ordered today demonstrates sinus tachy, HR 105, normal axis, QTc 446 ms, no ST changes, no change from prior tracing.   Recent Labs: 08/15/2016: ALT 15 09/03/2016: BUN 14; Creatinine, Ser 0.93; Hemoglobin 14.1; Platelets 279; Potassium 3.4; Sodium 137; TSH 2.022   Recent Lipid Panel    Component Value Date/Time   CHOL 248 (H) 03/24/2016 1014   TRIG 196.0 (H) 03/24/2016 1014   HDL 38.50 (L) 03/24/2016 1014   CHOLHDL 6 03/24/2016 1014   VLDL 39.2 03/24/2016 1014   LDLCALC 170 (H)  03/24/2016 1014   LDLDIRECT 162.0 03/27/2015 0953     Physical Exam:    VS:  BP (!) 120/94   Pulse (!) 105   Ht '5\' 9"'$  (1.753 m)   Wt 269 lb (122 kg)   LMP 12/13/1999 Comment: single oophorectomy//a.c.  BMI 39.72 kg/m     Wt Readings from Last 3 Encounters:  09/25/16 269 lb (122 kg)  09/04/16 269 lb 4.8 oz (122.2 kg)  09/04/16 268 lb (121.6 kg)     Physical Exam  Constitutional: She is oriented to person, place, and time. She appears well-developed and well-nourished. No distress.  HENT:  Head: Normocephalic and atraumatic.  Eyes: No scleral icterus.  Neck: Normal range of motion. No JVD present.  Cardiovascular: Normal rate, regular rhythm, S1 normal, S2 normal and normal heart sounds.   No murmur heard. Pulmonary/Chest: Breath sounds normal. She has no wheezes. She has no rhonchi. She has no rales.  Abdominal: Soft. There is no tenderness.  Musculoskeletal: She exhibits no edema.  Neurological: She is alert and oriented to person, place, and time.  Skin: Skin is warm and dry.  Psychiatric: She has a normal mood and affect.    ASSESSMENT:    1. Sinus tachycardia   2. Essential hypertension   3. Low back pain, unspecified back pain laterality, unspecified chronicity, with sciatica presence unspecified    PLAN:    In order of problems listed above:  1. Sinus tachycardia - She continues to have faster HRs.  Tachycardia is improved with resolution of her flu.  She deals with a lot of pain from her back and I  suspect her HR is faster in response to this.  She feels better sometimes when she holds the Diltiazem.  She may have paradoxical increases with her HR with calcium channel blocker.  I am not sure if this is entirely possible.  She takes a lot of pain medication and there are interactions between narcotics and calcium channel blocker.  In any event, I have asked her to hold the Diltiazem for a few days to see if this helps.  I will go ahead and set her up for follow up  with Dr. Caryl Comes in case she continues to have issues with sinus tachycardia.   -  Hold Diltiazem x 3 days  -  If better >> follow up with GI for other recommendations for her esophageal issue  -  If worse symptoms >> resume the Diltiazem and call for earlier FU with Dr. Virl Axe.  2. Essential hypertension - Borderline control.  Continue to monitor.    3. Low back pain, unspecified back pain laterality, unspecified chronicity, with sciatica presence unspecified - s/p prior back surgery.  Continue FU with neurosurgeon.     Dispo:  Return in about 3 months (around 12/25/2016) for Routine Follow Up with Dr. Caryl Comes.   Medication Adjustments/Labs and Tests Ordered: Current medicines are reviewed at length with the patient today.  Concerns regarding medicines are outlined above.  Orders placed this visit:  Orders Placed This Encounter  Procedures  . EKG 12-Lead   Medication changes this visit: No orders of the defined types were placed in this encounter.   Signed, Richardson Dopp, PA-C  09/25/2016 1:32 PM    [facility] [place], [place], [place]  [address] Phone: [phone]; Fax: [phone]

## 2016-09-25 NOTE — Patient Instructions (Addendum)
Medication Instructions:  HOLD DILTIAZEM FOR 3 DAYS; RESUME IF FEELS WORSE AND IF SO CALL THE OFFICE SO THAT WE MAY SCHEDULE YOU WITH DR. Graciela Husbands  IF HOLDING THE DILTIAZEM FOR 3 DAYS FEELS BETTER THEN DISCUSS WITH GI  Labwork: NONE ORDERED   Testing/Procedures: NONE ORDERED  Follow-Up: DR. Graciela Husbands ON 12/29/16 @ 4 PM   Any Other Special Instructions Will Be Listed Below (If Applicable).  If you need a refill on your cardiac medications before your next appointment, please call your pharmacy.

## 2016-09-30 ENCOUNTER — Ambulatory Visit (INDEPENDENT_AMBULATORY_CARE_PROVIDER_SITE_OTHER): Payer: BLUE CROSS/BLUE SHIELD | Admitting: Licensed Clinical Social Worker

## 2016-09-30 DIAGNOSIS — F3341 Major depressive disorder, recurrent, in partial remission: Secondary | ICD-10-CM | POA: Diagnosis not present

## 2016-10-12 ENCOUNTER — Ambulatory Visit (INDEPENDENT_AMBULATORY_CARE_PROVIDER_SITE_OTHER): Payer: BLUE CROSS/BLUE SHIELD | Admitting: Neurology

## 2016-10-12 DIAGNOSIS — M5416 Radiculopathy, lumbar region: Secondary | ICD-10-CM | POA: Diagnosis not present

## 2016-10-12 DIAGNOSIS — M545 Low back pain: Secondary | ICD-10-CM | POA: Diagnosis not present

## 2016-10-14 ENCOUNTER — Encounter: Payer: Self-pay | Admitting: Neurology

## 2016-10-14 ENCOUNTER — Ambulatory Visit (INDEPENDENT_AMBULATORY_CARE_PROVIDER_SITE_OTHER): Payer: BLUE CROSS/BLUE SHIELD | Admitting: Neurology

## 2016-10-14 ENCOUNTER — Telehealth: Payer: Self-pay | Admitting: *Deleted

## 2016-10-14 VITALS — BP 122/74 | HR 94 | Ht 69.0 in | Wt 273.9 lb

## 2016-10-14 DIAGNOSIS — G43009 Migraine without aura, not intractable, without status migrainosus: Secondary | ICD-10-CM | POA: Diagnosis not present

## 2016-10-14 MED ORDER — SUMATRIPTAN SUCCINATE 100 MG PO TABS: ORAL_TABLET | ORAL | 2 refills | Status: DC

## 2016-10-14 NOTE — Telephone Encounter (Signed)
Morrie SheldonAshley, I put the referral papers on top of the desk in case you need to refax   In case you hear of when the appointment is you can document it in this note and close it for me thanks

## 2016-10-14 NOTE — Telephone Encounter (Signed)
Faxed over referral form with office note today to [facility]

## 2016-10-14 NOTE — Patient Instructions (Signed)
Migraine Recommendations: 1.  We will try to refer you for biofeedback 2.  Take sumatriptan 100mg  at earliest onset of headache.  May repeat dose once in 2 hours if needed.  Do not exceed two tablets in 24 hours. 3.  Limit use of pain relievers to no more than 2 days out of the week.  These medications include acetaminophen, ibuprofen, triptans and narcotics.  This will help reduce risk of rebound headaches. 4.  Be aware of common food triggers such as processed sweets, processed foods with nitrites (such as deli meat, hot dogs, sausages), foods with MSG, alcohol (such as wine), chocolate, certain cheeses, certain fruits (dried fruits, some citrus fruit), vinegar, diet soda. 4.  Avoid caffeine 5.  Routine exercise 6.  Proper sleep hygiene 7.  Stay adequately hydrated with water 8.  Keep a headache diary. 9.  Maintain proper stress management. 10.  Do not skip meals. 11.  Consider supplements:  Magnesium citrate 400mg  to 600mg  daily, riboflavin 400mg , Coenzyme Q 10 100mg  three times daily 12.  Follow up in 5 months

## 2016-10-14 NOTE — Progress Notes (Signed)
NEUROLOGY FOLLOW UP OFFICE NOTE  Levern Kalka [id number]  HISTORY OF PRESENT ILLNESS: [name] is a 41 year old right-handed woman with hypertension, morbid obesity, GERD, chronic lumbar disc disease (L4-5) status post surgery, and fibromyalgia (treated with hydrocodone) who presents for chronic migraine and medication-overuse headaches.    UPDATE: I have not seen Ms. Buschman since October 2016. Headaches are improved.  However, she continues to have chronic pain due to fibromyalgia and lumbar disc disease. Intensity:  8/10 Duration:  With sumatriptan 50mg , pain decreases quickly to a mangeable 3-4/10, which may last the rest of the day. Frequency:  10 days per month Frequency of abortive medication: For chronic pain, takes Percocet daily Current NSAIDS:  no Current analgesics:  Percocet Current triptans:  sumatriptan 50mg  Current anti-emetic:  promethazine 25mg  Current muscle relaxants:  no Current anti-anxiolytic:  alprazolam Current sleep aide:  no Current Antihypertensive medications:  HCTZ Current Antidepressant medications:  no Current Anticonvulsant medications:  gabapentin 300mg  three times daily Current Vitamins/Herbal/Supplements:  B12, D Current Antihistamines/Decongestants:  Flonase Other therapy:  no  She was started on Prozac, which was subsequently discontinued after she developed suspected serotonin syndrome with experiencing increased headache, tachycardia and diaphoresis.  She continued to have palpitations and then atypical chest pain.  She was evaluated in the ED.  CTA chest negative for PE.  Troponins were negative.  She followed up with cardiology.  She was found to have the flu.  Tachycardia improved with treatment of the flu but persisted.Marland Kitchen  HISTORY: Onset:  10 years ago Location:  Top of head, sometimes radiating down either side of face to the cheek Quality:  Pressure-like, sometimes pounding Initial intensity:  4-8/10; April 4-8/10 Aura:  Severe  attacks preceded by scotomas and imbalance, x 30 minutes Associated symptoms:  Nausea, osmophobia, photophobia, phonophobia, hyperacusis, sometimes vomiting with severe ones Initial Duration:  Was 2-8 days.  April: 1-2 days Initial Frequency:  Almost daily.  April: she has about 5-7 headache-free days a month.  Half of headache days are migraine. Activity:  Needs to lay down Triggers/exacerbating factors:  Change in weather, stress, oranges, hot dogs Relieving factors:  Resting, applying pressure to head  Past abortive therapy:  Ibuprofen (avoids due to celebrex), tylenol (somewhat helpful), tried Imitrex shot once (helpfu but afraid to administer) Past antihypertensive:  Metoprolol (increased pain), atenolol (hair loss) Past anticonvulsant:  topiramate (didn't feel well) Past antidepressant:  nortriptyline (elevated blood pressure), Paxil (headache, tremor, diaphoresis, questionable serotonin syndrome?), Prozac (possible serotonin syndrome), venlafaxine (side effects), Zoloft (side effects)  Alcohol: no Caffeine:  A couple of sodas daily Smoking:  no Sleep hygiene:  Poor sleep (about 3 hours a night) Stress/depression:  Pain.  Yes, raising her children with husband now out of town often for work Family history of headache:  Aunt with headaches.  Another aunt with headaches and aneurysm rupture.  Son and daughter with migraines.  History of abnormal brain MRIs with nonspecific punctate and patchy white matter hyperintensities, stable over 5 years. 04/30/02 MRI Brain w/wo: nonspecific supratentorial white matter disease. 02/29/04 MRI Brain w/wo: numerous nonspecific punctate and patchy white matter changes in the subcortical and periventricular white matter.  No abnormal enhancement. Similar to prior exam from 2003. 02/29/04 MRA Head: no large vessel occlusion or aneurysm identified. 07/28/06 MRI Brain w/wo: nonspecific punctate and patchy subcortical white matter hyperintensities in the  subcortical and periventricular white matter, stable compared to prior study in 2005.  No abnormal enhancement. 07/28/06 MRA Head: Focal decreased  caliber of the anterior cerebral arteries proximally with relative decrease in the caliber of the A1 segments compared to the A2 segments bilaterally.   Mild focal decrease in the caliber of the right middle cerebral artery at its origin.  PAST MEDICAL HISTORY: Past Medical History:  Diagnosis Date  . Allergy   . Anxiety   . B12 deficiency   . Depression    not currently   . Dysrhythmia    hx tachy-was from medication nortriptylline  . Esophagitis    rx  . Fibromyalgia   . GERD (gastroesophageal reflux disease)   . History of echocardiogram    Echo 4/18: Mild concentric LVH, vigorous LVF, EF 65-70, normal wall motion, grade 1 diastolic dysfunction  . History of kidney stones   . Hyperlipidemia   . Intervertebral disc protrusion 01/11/2014  . Migraine   . Obesity (BMI 35.0-39.9 without comorbidity)   . Peripheral neuropathy   . Polycystic ovaries   . Spinal stenosis, lumbar     MEDICATIONS: Current Outpatient Prescriptions on File Prior to Visit  Medication Sig Dispense Refill  . ALPRAZolam (XANAX) 0.5 MG tablet Take 1 tablet (0.5 mg total) by mouth 3 (three) times daily as needed for anxiety. 90 tablet 5  . BD INTEGRA SYRINGE 25G X 1" 3 ML MISC USE AS DIRECTED 50 each 1  . calcium carbonate (TUMS - DOSED IN MG ELEMENTAL CALCIUM) 500 MG chewable tablet Chew 1 tablet by mouth 2 (two) times daily as needed for indigestion or heartburn.    . cyanocobalamin (,VITAMIN B-12,) 1000 MCG/ML injection INJECT 1 ML (1,000 MCG TOTAL) INTO THE MUSCLE EVERY 7 DAYS. 12 mL 3  . fluticasone (FLONASE) 50 MCG/ACT nasal spray Place 2 sprays into both nostrils daily as needed for allergies.     Marland Kitchen gabapentin (NEURONTIN) 100 MG capsule Take 100-200 mg by mouth 3 (three) times daily as needed.   0  . gabapentin (NEURONTIN) 300 MG capsule TAKE ONE CAPSULE BY  MOUTH 3 TIMES A DAY 90 capsule 3  . hydrochlorothiazide (HYDRODIURIL) 25 MG tablet Take 0.5 tablets (12.5 mg total) by mouth daily. 90 tablet 3  . Multiple Vitamins-Minerals (MULTI-VITAMIN GUMMIES PO) Take 2 tablets by mouth daily.     Marland Kitchen oxyCODONE-acetaminophen (PERCOCET) 10-325 MG per tablet Take 1 tablet by mouth every 6 (six) hours as needed for pain. 60 tablet 0  . potassium chloride (KLOR-CON 10) 10 MEQ tablet Take 1 tablet (10 mEq total) by mouth daily. 90 tablet 3  . promethazine (PHENERGAN) 25 MG tablet TAKE 1 TABLET BY MOUTH EVERY 6 HOURS AS NEEDED FOR NAUSEA 90 tablet 1  . rosuvastatin (CRESTOR) 10 MG tablet Take 1 tablet (10 mg total) by mouth daily. 30 tablet 2  . simethicone (MYLICON) 80 MG chewable tablet Chew 160 mg by mouth every 6 (six) hours as needed (gas pain).    . sucralfate (CARAFATE) 1 GM/10ML suspension Take 10 mLs (1 g total) by mouth 4 (four) times daily -  with meals and at bedtime. 420 mL 2  . Vitamin D, Ergocalciferol, (DRISDOL) 50000 units CAPS capsule Take 1 capsule (50,000 Units total) by mouth every 7 (seven) days. 12 capsule 3  . diltiazem (CARDIZEM SR) 60 MG 12 hr capsule Take 60 mg by mouth 2 (two) times daily.     No current facility-administered medications on file prior to visit.     ALLERGIES: Allergies  Allergen Reactions  . Gadolinium Derivatives Swelling and Other (See Comments)    Arm  swelling, tingling, & redness. Radiologist Dr. Grace IsaacWatts recommends an injection of steroids if patient needs a gadolinium based contrast in the future.  Marland Kitchen. Zoloft [Sertraline Hcl]     Hair loss    FAMILY HISTORY: Family History  Problem Relation Age of Onset  . Fibromyalgia Mother   . Diabetes Mother   . Thyroid cancer Mother   . Liver disease Mother   . Neuropathy Mother   . Cirrhosis Mother        non-alcoholic  . Pulmonary embolism Mother        both lungs  . Hypertension Father   . Diabetes Father   . Heart disease Father   . Prostate cancer Father   .  Heart attack Father        x 2  . Colon cancer Neg Hx   . Other Neg Hx        pheochromocytoma  . Colon polyps Neg Hx     SOCIAL HISTORY: Social History   Social History  . Marital status: Married    Spouse name: N/A  . Number of children: 2  . Years of education: N/A   Occupational History  . housewife    Social History Main Topics  . Smoking status: Former Smoker    Types: Cigarettes    Quit date: 06/01/1998  . Smokeless tobacco: Never Used  . Alcohol use No  . Drug use: No  . Sexual activity: Yes    Birth control/ protection: Surgical   Other Topics Concern  . Not on file   Social History Narrative  . No narrative on file    REVIEW OF SYSTEMS: Constitutional: No fevers, chills, or sweats, no generalized fatigue, change in appetite Eyes: No visual changes, double vision, eye pain Ear, nose and throat: No hearing loss, ear pain, nasal congestion, sore throat Cardiovascular: No chest pain, palpitations Respiratory:  No shortness of breath at rest or with exertion, wheezes GastrointestinaI: No nausea, vomiting, diarrhea, abdominal pain, fecal incontinence Genitourinary:  No dysuria, urinary retention or frequency Musculoskeletal:  No neck pain, back pain Integumentary: No rash, pruritus, skin lesions Neurological: as above Psychiatric: No depression, insomnia, anxiety Endocrine: No palpitations, fatigue, diaphoresis, mood swings, change in appetite, change in weight, increased thirst Hematologic/Lymphatic:  No purpura, petechiae. Allergic/Immunologic: no itchy/runny eyes, nasal congestion, recent allergic reactions, rashes  PHYSICAL EXAM: Vitals:   10/14/16 0939  BP: 122/74  Pulse: 94   General: No acute distress.  Patient appears well-groomed.  Head:  Normocephalic/atraumatic Eyes:  Fundi examined but not visualized Neck: supple, no paraspinal tenderness, full range of motion Heart:  Regular rate and rhythm Lungs:  Clear to auscultation bilaterally Back:  No paraspinal tenderness Neurological Exam: alert and oriented to person, place, and time. Attention span and concentration intact, recent and remote memory intact, fund of knowledge intact.  Speech fluent and not dysarthric, language intact.  CN II-XII intact. Bulk and tone normal, muscle strength 5/5 throughout.  Sensation to light touch, temperature and vibration intact.  Deep tendon reflexes 2+ throughout, toes downgoing.  Finger to nose and heel to shin testing intact.  Gait normal, Romberg negative.  IMPRESSION: Migraine  PLAN: 1.  We will increase sumatriptan to 100mg  2.  We will refer her for biofeedback 3.  Follow up in 5 months.  26 minutes spent face to face with patient, over 50% spent discussing management.  Shon MilletAdam Levorn Oleski, DO  CC:  Gershon CraneStephen Fry, MD

## 2016-10-16 ENCOUNTER — Telehealth: Payer: Self-pay | Admitting: Family Medicine

## 2016-10-16 ENCOUNTER — Ambulatory Visit (INDEPENDENT_AMBULATORY_CARE_PROVIDER_SITE_OTHER): Payer: BLUE CROSS/BLUE SHIELD | Admitting: Family Medicine

## 2016-10-16 ENCOUNTER — Encounter: Payer: Self-pay | Admitting: Family Medicine

## 2016-10-16 VITALS — BP 102/84 | HR 122 | Temp 99.0°F | Ht 69.0 in | Wt 271.2 lb

## 2016-10-16 DIAGNOSIS — R350 Frequency of micturition: Secondary | ICD-10-CM | POA: Diagnosis not present

## 2016-10-16 LAB — POC URINALSYSI DIPSTICK (AUTOMATED)
Bilirubin, UA: NEGATIVE
Blood, UA: NEGATIVE
Glucose, UA: NEGATIVE
Ketones, UA: NEGATIVE
Leukocytes, UA: NEGATIVE
Nitrite, UA: NEGATIVE
Protein, UA: NEGATIVE
Spec Grav, UA: 1.025 (ref 1.010–1.025)
Urobilinogen, UA: 0.2 E.U./dL
pH, UA: 6 (ref 5.0–8.0)

## 2016-10-16 MED ORDER — CEPHALEXIN 500 MG PO CAPS: 500.0000 mg | ORAL_CAPSULE | Freq: Four times a day (QID) | ORAL | 0 refills | Status: DC

## 2016-10-16 NOTE — Telephone Encounter (Signed)
Pt is stating that the pharmacy advised her to call here to get a refill for her BD INTEGRA SYRINGE 25G X 1" 3 ML MISC.  She doesn't understand why it couldn't be filled.

## 2016-10-16 NOTE — Progress Notes (Signed)
Subjective:  [name] is a 41 y.o. year old very pleasant female patient who presents for/with See problem oriented charting ROS- no fever or chills. Some back pain, some lower abdominal pain.  No dysuria. Urinary frequency noted.   Past Medical History-  Patient Active Problem List   Diagnosis Date Noted  . Atypical chest pain   . Spinal stenosis of lumbar region 04/01/2015  . Chronic migraine without aura without status migrainosus, not intractable 03/04/2015  . Elevated plasma metanephrines 11/16/2014  . Alopecia 05/21/2014  . Chronic tachycardia 04/04/2014  . Sciatica 01/11/2014  . Back pain 01/11/2014  . Intervertebral disc protrusion 01/11/2014  . HTN (hypertension) 01/04/2013  . Hemorrhoids 04/25/2012  . Urinary retention 12/11/2010  . CERUMEN IMPACTION 01/28/2010  . Depression 04/08/2009  . FIBROMYALGIA 10/11/2008  . Morbid obesity (HCC) 08/10/2008  . ACNE VULGARIS 08/10/2008  . INSOMNIA 06/20/2008  . VITAMIN B12 DEFICIENCY 03/23/2008  . PERIPHERAL NEUROPATHY 02/27/2008  . CONSTIPATION 08/22/2007  . SINUSITIS, ACUTE FRONTAL 02/07/2007  . ANXIETY DISORDER, GENERALIZED 01/04/2007  . Gastroesophageal reflux disease 01/04/2007  . DEGENERATIVE JOINT DISEASE, KNEES, BILATERAL 01/04/2007  . RENAL CALCULUS, HX OF 01/04/2007    Medications- reviewed and updated Current Outpatient Prescriptions  Medication Sig Dispense Refill  . ALPRAZolam (XANAX) 0.5 MG tablet Take 1 tablet (0.5 mg total) by mouth 3 (three) times daily as needed for anxiety. 90 tablet 5  . BD INTEGRA SYRINGE 25G X 1" 3 ML MISC USE AS DIRECTED 50 each 1  . calcium carbonate (TUMS - DOSED IN MG ELEMENTAL CALCIUM) 500 MG chewable tablet Chew 1 tablet by mouth 2 (two) times daily as needed for indigestion or heartburn.    . cyanocobalamin (,VITAMIN B-12,) 1000 MCG/ML injection INJECT 1 ML (1,000 MCG TOTAL) INTO THE MUSCLE EVERY 7 DAYS. 12 mL 3  . diltiazem (CARDIZEM SR) 60 MG 12 hr capsule Take 60 mg by  mouth 2 (two) times daily.    . fluconazole (DIFLUCAN) 150 MG tablet Take 1 tablet by mouth daily as needed.  5  . fluticasone (FLONASE) 50 MCG/ACT nasal spray Place 2 sprays into both nostrils daily as needed for allergies.     Marland Kitchen gabapentin (NEURONTIN) 100 MG capsule Take 100-200 mg by mouth 3 (three) times daily as needed.   0  . gabapentin (NEURONTIN) 300 MG capsule TAKE ONE CAPSULE BY MOUTH 3 TIMES A DAY 90 capsule 3  . hydrochlorothiazide (HYDRODIURIL) 25 MG tablet Take 0.5 tablets (12.5 mg total) by mouth daily. 90 tablet 3  . Multiple Vitamin (MULTIVITAMIN) tablet Take 1 tablet by mouth daily.    . Multiple Vitamins-Minerals (MULTI-VITAMIN GUMMIES PO) Take 2 tablets by mouth daily.     . nitrofurantoin, macrocrystal-monohydrate, (MACROBID) 100 MG capsule Take 1 capsule by mouth as needed.  2  . oxyCODONE-acetaminophen (PERCOCET) 10-325 MG per tablet Take 1 tablet by mouth every 6 (six) hours as needed for pain. 60 tablet 0  . phentermine 37.5 MG capsule Take 37.5 mg by mouth every morning.    . potassium chloride (KLOR-CON 10) 10 MEQ tablet Take 1 tablet (10 mEq total) by mouth daily. 90 tablet 3  . promethazine (PHENERGAN) 25 MG tablet TAKE 1 TABLET BY MOUTH EVERY 6 HOURS AS NEEDED FOR NAUSEA 90 tablet 1  . simethicone (MYLICON) 80 MG chewable tablet Chew 160 mg by mouth every 6 (six) hours as needed (gas pain).    . sucralfate (CARAFATE) 1 GM/10ML suspension Take 10 mLs (1 g total) by mouth  4 (four) times daily -  with meals and at bedtime. 420 mL 2  . SUMAtriptan (IMITREX) 100 MG tablet Take 1 tablet earliest onset of migraine.  May repeat once in 2 hours if headache persists or recurs. 10 tablet 2  . Vitamin D, Ergocalciferol, (DRISDOL) 50000 units CAPS capsule Take 1 capsule (50,000 Units total) by mouth every 7 (seven) days. 12 capsule 3  . rosuvastatin (CRESTOR) 10 MG tablet Take 1 tablet (10 mg total) by mouth daily. (Patient not taking: Reported on 10/16/2016) 30 tablet 2   No  current facility-administered medications for this visit.     Objective: BP 102/84 (BP Location: Left Arm, Patient Position: Sitting, Cuff Size: Large)   Pulse (!) 122   Temp 99 F (37.2 C) (Oral)   Ht 5\' 9"  (1.753 m)   Wt 271 lb 3.2 oz (123 kg)   LMP 12/13/1999 Comment: single oophorectomy//a.c.  SpO2 96%   BMI 40.05 kg/m  Gen: NAD, resting comfortably CV: RRR no murmurs rubs or gallops Lungs: CTAB no crackles, wheeze, rhonchi Abdomen: soft/nontender/nondistended/normal bowel sounds. No rebound or guarding.  Ext: no edema Skin: warm, dry Neuro: grossly normal, moves all extremities  Results for orders placed or performed in visit on 10/16/16 (from the past 24 hour(s))  POCT Urinalysis Dipstick (Automated)     Status: None   Collection Time: 10/16/16  4:12 PM  Result Value Ref Range   Color, UA yellow    Clarity, UA clear    Glucose, UA N    Bilirubin, UA N    Ketones, UA N    Spec Grav, UA 1.025 1.010 - 1.025   Blood, UA N    pH, UA 6.0 5.0 - 8.0   Protein, UA N    Urobilinogen, UA 0.2 0.2 or 1.0 E.U./dL   Nitrite, UA N    Leukocytes, UA Negative Negative   Assessment/Plan:  Urinary frequency - Plan: POCT Urinalysis Dipstick (Automated) S: had a "bladder tack" a few years ago since that time was given macrobid to take before sex because tends to get UTI. Had sex 2-3 weeks ago- started having symptoms on a Thursday (had forgotten to take macrobid after sex). Had taken 2 macrobid and azo then called our office and reports being told to take 5 days twice a day macrobid. Felt better then 2 days started to feel some recurrence of symptoms- would take one more macrobid and then several days later would take one more. Mild pain in right low back- slightly higher than her regular low back pain (upcoming fusion). Having urinary frequency, incomplete emptying. No dysuria. Most recent macrobid was 2 days. Mild lower abdominal discomfort A/P: Suspect UTI. Urine normal but has been  taking intermittent macrobid so may mask. Will get urine culture to further evaluate. Treat with keflex 4x a day for 7 days. Discussed liekly 5 days treatment even if negative culture- if negative culture would have her not use anymore antibiotics then come back for repeat evaluation with urine culture.   We discussed the intermittent antibiotic use may make evaluation more difficult so discouraged this in the future (other than after sex)  Orders Placed This Encounter  Procedures  . Urine culture    [facility]  . POCT Urinalysis Dipstick (Automated)   Meds ordered this encounter  Medications  . fluconazole (DIFLUCAN) 150 MG tablet    Sig: Take 1 tablet by mouth daily as needed.    Refill:  5  . nitrofurantoin, macrocrystal-monohydrate, (MACROBID) 100  MG capsule    Sig: Take 1 capsule by mouth as needed.    Refill:  2  . phentermine 37.5 MG capsule    Sig: Take 37.5 mg by mouth every morning.  . Multiple Vitamin (MULTIVITAMIN) tablet    Sig: Take 1 tablet by mouth daily.    Return precautions advised.  Tana ConchStephen Haydan Mansouri, MD

## 2016-10-16 NOTE — Patient Instructions (Signed)
Antibiotic for 7 days planned- take 4x a day. If you miss one dose a day- at least make sure to get 3 in each day  We will get a urine culture and will adjust if needed. If no infection found- likely stop antibiotics then get culture if symptoms recur

## 2016-10-17 LAB — URINE CULTURE

## 2016-10-19 ENCOUNTER — Other Ambulatory Visit: Payer: Self-pay

## 2016-10-19 MED ORDER — "SYRINGE/NEEDLE (DISP) 25G X 1"" 3 ML MISC": 1 refills | Status: DC

## 2016-10-19 NOTE — Telephone Encounter (Signed)
Rx has been refilled as directed.  

## 2016-10-20 DIAGNOSIS — M4807 Spinal stenosis, lumbosacral region: Secondary | ICD-10-CM | POA: Diagnosis not present

## 2016-10-20 DIAGNOSIS — M47817 Spondylosis without myelopathy or radiculopathy, lumbosacral region: Secondary | ICD-10-CM | POA: Diagnosis not present

## 2016-10-20 DIAGNOSIS — M5127 Other intervertebral disc displacement, lumbosacral region: Secondary | ICD-10-CM | POA: Diagnosis not present

## 2016-10-23 ENCOUNTER — Telehealth: Payer: Self-pay

## 2016-10-23 ENCOUNTER — Telehealth: Payer: Self-pay | Admitting: Family Medicine

## 2016-10-23 DIAGNOSIS — M5137 Other intervertebral disc degeneration, lumbosacral region: Secondary | ICD-10-CM | POA: Diagnosis not present

## 2016-10-23 DIAGNOSIS — Z6838 Body mass index (BMI) 38.0-38.9, adult: Secondary | ICD-10-CM | POA: Diagnosis not present

## 2016-10-23 NOTE — Telephone Encounter (Signed)
Spoke with patient who verbalized understanding about her lab results.

## 2016-10-23 NOTE — Telephone Encounter (Signed)
Request from [facility] for Pantoprazole, when ordered for twice a day it will require prior authorization.

## 2016-10-23 NOTE — Telephone Encounter (Signed)
This is managed by Dr. Adela LankArmbruster in GI. Please ask them to take care of the PA

## 2016-10-27 ENCOUNTER — Other Ambulatory Visit: Payer: Self-pay | Admitting: Neurosurgery

## 2016-10-27 DIAGNOSIS — M5137 Other intervertebral disc degeneration, lumbosacral region: Secondary | ICD-10-CM

## 2016-10-27 NOTE — Telephone Encounter (Signed)
I sent pt a my chart message with this information.  

## 2016-10-28 ENCOUNTER — Ambulatory Visit: Payer: Self-pay | Admitting: Neurology

## 2016-10-29 ENCOUNTER — Ambulatory Visit (INDEPENDENT_AMBULATORY_CARE_PROVIDER_SITE_OTHER): Payer: BLUE CROSS/BLUE SHIELD | Admitting: Licensed Clinical Social Worker

## 2016-10-29 DIAGNOSIS — F3341 Major depressive disorder, recurrent, in partial remission: Secondary | ICD-10-CM | POA: Diagnosis not present

## 2016-11-02 ENCOUNTER — Ambulatory Visit
Admission: RE | Admit: 2016-11-02 | Discharge: 2016-11-02 | Disposition: A | Discharge status: Home / Self Care | Payer: BLUE CROSS/BLUE SHIELD | Source: Ambulatory Visit | Attending: Neurosurgery | Admitting: Neurosurgery

## 2016-11-02 DIAGNOSIS — M47817 Spondylosis without myelopathy or radiculopathy, lumbosacral region: Secondary | ICD-10-CM | POA: Diagnosis not present

## 2016-11-02 DIAGNOSIS — M5137 Other intervertebral disc degeneration, lumbosacral region: Secondary | ICD-10-CM

## 2016-11-02 MED ORDER — METHYLPREDNISOLONE ACETATE 40 MG/ML INJ SUSP (RADIOLOG
120.0000 mg | Freq: Once | INTRAMUSCULAR | Status: AC
  Administered 2016-11-02: 120 mg via EPIDURAL

## 2016-11-02 MED ORDER — IOPAMIDOL (ISOVUE-M 200) INJECTION 41%
1.0000 mL | Freq: Once | INTRAMUSCULAR | Status: AC
  Administered 2016-11-02: 1 mL via EPIDURAL

## 2016-11-02 NOTE — Discharge Instructions (Signed)

## 2016-11-05 ENCOUNTER — Telehealth: Payer: Self-pay | Admitting: Radiology

## 2016-11-05 NOTE — Telephone Encounter (Signed)
Pt called about being dizzy 3 days after her steroid injection. Explained the injection did not usual have that side effect. The pt does take several meds that do. Also, explained the injection could cause an increase in blood pressure that was self limiting.

## 2016-11-09 ENCOUNTER — Ambulatory Visit: Payer: BLUE CROSS/BLUE SHIELD | Admitting: Psychology

## 2016-11-11 ENCOUNTER — Ambulatory Visit (INDEPENDENT_AMBULATORY_CARE_PROVIDER_SITE_OTHER): Payer: BLUE CROSS/BLUE SHIELD | Admitting: Licensed Clinical Social Worker

## 2016-11-11 DIAGNOSIS — F3341 Major depressive disorder, recurrent, in partial remission: Secondary | ICD-10-CM

## 2016-11-13 ENCOUNTER — Inpatient Hospital Stay (HOSPITAL_COMMUNITY)
Admission: AD | Admit: 2016-11-13 | Discharge: 2016-11-13 | Disposition: A | Discharge status: Home / Self Care | Payer: BLUE CROSS/BLUE SHIELD | Source: Ambulatory Visit | Attending: Obstetrics and Gynecology | Admitting: Obstetrics and Gynecology

## 2016-11-13 ENCOUNTER — Encounter (HOSPITAL_COMMUNITY): Payer: Self-pay

## 2016-11-13 DIAGNOSIS — G629 Polyneuropathy, unspecified: Secondary | ICD-10-CM | POA: Diagnosis not present

## 2016-11-13 DIAGNOSIS — M797 Fibromyalgia: Secondary | ICD-10-CM | POA: Diagnosis not present

## 2016-11-13 DIAGNOSIS — N3090 Cystitis, unspecified without hematuria: Secondary | ICD-10-CM

## 2016-11-13 DIAGNOSIS — F329 Major depressive disorder, single episode, unspecified: Secondary | ICD-10-CM | POA: Diagnosis not present

## 2016-11-13 DIAGNOSIS — R3915 Urgency of urination: Secondary | ICD-10-CM

## 2016-11-13 DIAGNOSIS — E785 Hyperlipidemia, unspecified: Secondary | ICD-10-CM | POA: Diagnosis not present

## 2016-11-13 DIAGNOSIS — K219 Gastro-esophageal reflux disease without esophagitis: Secondary | ICD-10-CM | POA: Insufficient documentation

## 2016-11-13 DIAGNOSIS — Z87891 Personal history of nicotine dependence: Secondary | ICD-10-CM | POA: Diagnosis not present

## 2016-11-13 DIAGNOSIS — R109 Unspecified abdominal pain: Secondary | ICD-10-CM

## 2016-11-13 DIAGNOSIS — E538 Deficiency of other specified B group vitamins: Secondary | ICD-10-CM | POA: Insufficient documentation

## 2016-11-13 DIAGNOSIS — Z79899 Other long term (current) drug therapy: Secondary | ICD-10-CM | POA: Diagnosis not present

## 2016-11-13 DIAGNOSIS — E669 Obesity, unspecified: Secondary | ICD-10-CM | POA: Insufficient documentation

## 2016-11-13 DIAGNOSIS — F419 Anxiety disorder, unspecified: Secondary | ICD-10-CM | POA: Insufficient documentation

## 2016-11-13 DIAGNOSIS — Z6835 Body mass index (BMI) 35.0-35.9, adult: Secondary | ICD-10-CM | POA: Insufficient documentation

## 2016-11-13 DIAGNOSIS — N8110 Cystocele, unspecified: Secondary | ICD-10-CM

## 2016-11-13 LAB — URINALYSIS, ROUTINE W REFLEX MICROSCOPIC
Bilirubin Urine: NEGATIVE
Glucose, UA: NEGATIVE mg/dL
Hgb urine dipstick: NEGATIVE
Ketones, ur: NEGATIVE mg/dL
Leukocytes, UA: NEGATIVE
Nitrite: NEGATIVE
Protein, ur: NEGATIVE mg/dL
Specific Gravity, Urine: 1.028 (ref 1.005–1.030)
pH: 5 (ref 5.0–8.0)

## 2016-11-13 LAB — WET PREP, GENITAL
Clue Cells Wet Prep HPF POC: NONE SEEN
Sperm: NONE SEEN
Trich, Wet Prep: NONE SEEN
Yeast Wet Prep HPF POC: NONE SEEN

## 2016-11-13 MED ORDER — PHENAZOPYRIDINE HCL 200 MG PO TABS: 200.0000 mg | ORAL_TABLET | Freq: Three times a day (TID) | ORAL | 1 refills | Status: DC | PRN

## 2016-11-13 NOTE — MAU Provider Note (Signed)
Chief Complaint: pelvic pressure and possible bladder prolapse   First Provider Initiated Contact with Patient 11/13/16 2131      SUBJECTIVE HPI: [name] is a 41 y.o. G2P0202 who presents to maternity admissions reporting pelvic pressure/fullness and urinary urgency x 2 weeks with reported sensation of something coming out of her vagina today. She has a hx of vaginal hysterectomy followed several years later by bladder tack procedure done in 2012.  She reports the sensation of something coming out is similar to what she felt before her bladder procedure but she did not have the urinary urgency.  She is concerned she may have a UTI.  The bladder sensations are associated with a pain in both flanks, but greater in the left side that is sharp and intermittent. She takes Percocet daily for her chronic back pain but this pain is breaking through her medication.  There are no other associated symptoms. She denies vaginal bleeding, vaginal itching/burning, urinary symptoms, h/a, dizziness, n/v, or fever/chills.     HPI  Past Medical History:  Diagnosis Date  . Allergy   . Anxiety   . B12 deficiency   . Depression    not currently   . Dysrhythmia    hx tachy-was from medication nortriptylline  . Esophagitis    rx  . Fibromyalgia   . GERD (gastroesophageal reflux disease)   . History of echocardiogram    Echo 4/18: Mild concentric LVH, vigorous LVF, EF 65-70, normal wall motion, grade 1 diastolic dysfunction  . History of kidney stones   . Hyperlipidemia   . Intervertebral disc protrusion 01/11/2014  . Migraine   . Obesity (BMI 35.0-39.9 without comorbidity)   . Peripheral neuropathy   . Polycystic ovaries   . Spinal stenosis, lumbar    Past Surgical History:  Procedure Laterality Date  . 24 HOUR PH STUDY N/A 05/27/2016   Procedure: 24 HOUR PH STUDY;  Surgeon: Ruffin Frederick, MD;  Location: [facility];  Service: Gastroenterology;  Laterality: N/A;  . APPENDECTOMY    .  BACK SURGERY  2012   left laminectomy at L3-4 per Dr. Phoebe Perch   . bladder tack  2012  . CHOLECYSTECTOMY N/A 10/03/2015   Procedure: LAPAROSCOPIC CHOLECYSTECTOMY;  Surgeon: Rodman Pickle, MD;  Location: [facility];  Service: General;  Laterality: N/A;  . ESOPHAGEAL MANOMETRY N/A 05/27/2016   Procedure: ESOPHAGEAL MANOMETRY (EM);  Surgeon: Ruffin Frederick, MD;  Location: [facility];  Service: Gastroenterology;  Laterality: N/A;  . EXCISION OF SKIN TAG  10/03/2015   Procedure: EXCISION OF SKIN TAG;  Surgeon: De Blanch Kinsinger, MD;  Location: [facility] OR;  Service: General;;  . LUMBAR DISC SURGERY  01-31-14   per Dr. [name]   . OOPHORECTOMY     left overy  . OVARIAN CYST REMOVAL    . RECTOCELE REPAIR    . SINUSOTOMY  12/14/06  . spinal fusion  2016/10  . TONSILLECTOMY    . TONSILLECTOMY    . VAGINAL HYSTERECTOMY    . WRIST GANGLION EXCISION Left    Social History   Social History  . Marital status: Married    Spouse name: N/A  . Number of children: 2  . Years of education: N/A   Occupational History  . housewife    Social History Main Topics  . Smoking status: Former Smoker    Types: Cigarettes    Quit date: 06/01/1998  . Smokeless tobacco: Never Used  . Alcohol use No  . Drug use: No  .  Sexual activity: Yes    Birth control/ protection: Surgical   Other Topics Concern  . Not on file   Social History Narrative  . No narrative on file   No current facility-administered medications on file prior to encounter.    Current Outpatient Prescriptions on File Prior to Encounter  Medication Sig Dispense Refill  . ALPRAZolam (XANAX) 0.5 MG tablet Take 1 tablet (0.5 mg total) by mouth 3 (three) times daily as needed for anxiety. 90 tablet 5  . calcium carbonate (TUMS - DOSED IN MG ELEMENTAL CALCIUM) 500 MG chewable tablet Chew 1 tablet by mouth 2 (two) times daily as needed for indigestion or heartburn.    . fluticasone (FLONASE) 50 MCG/ACT nasal spray Place 2 sprays into both  nostrils daily as needed for allergies.     Marland Kitchen gabapentin (NEURONTIN) 100 MG capsule Take 100-200 mg by mouth 3 (three) times daily as needed (breakthrough pain).   0  . gabapentin (NEURONTIN) 300 MG capsule TAKE ONE CAPSULE BY MOUTH 3 TIMES A DAY 90 capsule 3  . hydrochlorothiazide (HYDRODIURIL) 25 MG tablet Take 0.5 tablets (12.5 mg total) by mouth daily. (Patient taking differently: Take 25 mg by mouth daily. ) 90 tablet 3  . Multiple Vitamins-Minerals (MULTI-VITAMIN GUMMIES PO) Take 2 tablets by mouth daily.     . nitrofurantoin, macrocrystal-monohydrate, (MACROBID) 100 MG capsule Take 1 capsule by mouth daily as needed (for uti prophylaxis, after intercourse).   2  . oxyCODONE-acetaminophen (PERCOCET) 10-325 MG per tablet Take 1 tablet by mouth every 6 (six) hours as needed for pain. 60 tablet 0  . phentermine 37.5 MG capsule Take 37.5 mg by mouth every morning.    . potassium chloride (KLOR-CON 10) 10 MEQ tablet Take 1 tablet (10 mEq total) by mouth daily. 90 tablet 3  . promethazine (PHENERGAN) 25 MG tablet TAKE 1 TABLET BY MOUTH EVERY 6 HOURS AS NEEDED FOR NAUSEA 90 tablet 1  . simethicone (MYLICON) 80 MG chewable tablet Chew 160 mg by mouth every 6 (six) hours as needed (gas pain).    . sucralfate (CARAFATE) 1 GM/10ML suspension Take 10 mLs (1 g total) by mouth 4 (four) times daily -  with meals and at bedtime. (Patient taking differently: Take 1 g by mouth 3 (three) times daily as needed (gi issues). ) 420 mL 2  . SUMAtriptan (IMITREX) 100 MG tablet Take 1 tablet earliest onset of migraine.  May repeat once in 2 hours if headache persists or recurs. 10 tablet 2  . cephALEXin (KEFLEX) 500 MG capsule Take 1 capsule (500 mg total) by mouth 4 (four) times daily. (Patient not taking: Reported on 11/13/2016) 28 capsule 0  . cyanocobalamin (,VITAMIN B-12,) 1000 MCG/ML injection INJECT 1 ML (1,000 MCG TOTAL) INTO THE MUSCLE EVERY 7 DAYS. 12 mL 3  . fluconazole (DIFLUCAN) 150 MG tablet Take 1 tablet by  mouth daily as needed (yeast infection).   5  . rosuvastatin (CRESTOR) 10 MG tablet Take 1 tablet (10 mg total) by mouth daily. (Patient not taking: Reported on 10/16/2016) 30 tablet 2  . SYRINGE-NEEDLE, DISP, 3 ML (BD INTEGRA SYRINGE) 25G X 1" 3 ML MISC USE AS DIRECTED 50 each 1  . Vitamin D, Ergocalciferol, (DRISDOL) 50000 units CAPS capsule Take 1 capsule (50,000 Units total) by mouth every 7 (seven) days. 12 capsule 3   Allergies  Allergen Reactions  . Gadolinium Derivatives Swelling and Other (See Comments)    Arm swelling, tingling, & redness. Radiologist Dr. Grace Isaac recommends  an injection of steroids if patient needs a gadolinium based contrast in the future.  Marland Kitchen. Zoloft [Sertraline Hcl]     Hair loss    ROS:  Review of Systems  Constitutional: Negative for chills, fatigue and fever.  Respiratory: Negative for shortness of breath.   Cardiovascular: Negative for chest pain.  Gastrointestinal: Negative for nausea and vomiting.  Genitourinary: Positive for dyspareunia, pelvic pain, urgency and vaginal pain. Negative for difficulty urinating, dysuria, flank pain, vaginal bleeding and vaginal discharge.  Musculoskeletal: Positive for back pain.  Neurological: Negative for dizziness and headaches.  Psychiatric/Behavioral: Negative.      I have reviewed patient's Past Medical Hx, Surgical Hx, Family Hx, Social Hx, medications and allergies.   Physical Exam  Patient Vitals for the past 24 hrs:  BP Temp Temp src Pulse Resp SpO2 Weight  11/13/16 2006 (!) 153/102 - - 96 - - -  11/13/16 1752 (!) 143/104 98.2 F (36.8 C) Oral 93 18 97 % 262 lb 12 oz (119.2 kg)   Constitutional: Well-developed, well-nourished female in no acute distress.  Cardiovascular: normal rate Respiratory: normal effort GI: Abd soft, non-tender. Pos BS x 4 MS: Extremities nontender, no edema, normal ROM Neurologic: Alert and oriented x 4.  GU: Neg CVAT.  PELVIC EXAM: On inspection, a mild cystocele is noted from  anterior wall of vagina, not coming to opening of introitus even with valsalva, mild erythema noted of vulvar area.  Wet prep collected by blind swab.   LAB RESULTS Results for orders placed or performed during the hospital encounter of 11/13/16 (from the past 24 hour(s))  Urinalysis, Routine w reflex microscopic     Status: None   Collection Time: 11/13/16  5:55 PM  Result Value Ref Range   Color, Urine YELLOW YELLOW   APPearance CLEAR CLEAR   Specific Gravity, Urine 1.028 1.005 - 1.030   pH 5.0 5.0 - 8.0   Glucose, UA NEGATIVE NEGATIVE mg/dL   Hgb urine dipstick NEGATIVE NEGATIVE   Bilirubin Urine NEGATIVE NEGATIVE   Ketones, ur NEGATIVE NEGATIVE mg/dL   Protein, ur NEGATIVE NEGATIVE mg/dL   Nitrite NEGATIVE NEGATIVE   Leukocytes, UA NEGATIVE NEGATIVE  Wet prep, genital     Status: Abnormal   Collection Time: 11/13/16  9:54 PM  Result Value Ref Range   Yeast Wet Prep HPF POC NONE SEEN NONE SEEN   Trich, Wet Prep NONE SEEN NONE SEEN   Clue Cells Wet Prep HPF POC NONE SEEN NONE SEEN   WBC, Wet Prep HPF POC MODERATE (A) NONE SEEN   Sperm NONE SEEN        IMAGING   MAU Management/MDM: Ordered labs and reviewed results.  With no evidence of UTI, no CVA tenderness, and minor cystocele noted, pain may be cystitis that is not infectious or neurological related to pt chronic spinal condition.  Wet prep also negative.  Consult Dr Marcelle OverlieHolland with presentation and findings.  Will treat cystitis with pyridium 200 mg TID PRN. Pt to follow up with Dr Henderson Cloudomblin to manage cystocele if desired.  F/u with Neurosurgery as planned next week.  Pt stable at time of discharge.  ASSESSMENT 1. Cystitis   2. Urinary urgency   3. Female bladder prolapse   4. Left flank pain     PLAN Discharge home  Allergies as of 11/13/2016      Reactions   Gadolinium Derivatives Swelling, Other (See Comments)   Arm swelling, tingling, & redness. Radiologist Dr. Grace IsaacWatts recommends an injection of  steroids if patient  needs a gadolinium based contrast in the future.   Zoloft [sertraline Hcl]    Hair loss      Medication List    STOP taking these medications   cephALEXin 500 MG capsule Commonly known as:  KEFLEX   rosuvastatin 10 MG tablet Commonly known as:  CRESTOR     TAKE these medications   ALPRAZolam 0.5 MG tablet Commonly known as:  XANAX Take 1 tablet (0.5 mg total) by mouth 3 (three) times daily as needed for anxiety.   calcium carbonate 500 MG chewable tablet Commonly known as:  TUMS - dosed in mg elemental calcium Chew 1 tablet by mouth 2 (two) times daily as needed for indigestion or heartburn.   cyanocobalamin 1000 MCG/ML injection Commonly known as:  (VITAMIN B-12) INJECT 1 ML (1,000 MCG TOTAL) INTO THE MUSCLE EVERY 7 DAYS.   fluconazole 150 MG tablet Commonly known as:  DIFLUCAN Take 1 tablet by mouth daily as needed (yeast infection).   fluticasone 50 MCG/ACT nasal spray Commonly known as:  FLONASE Place 2 sprays into both nostrils daily as needed for allergies.   gabapentin 100 MG capsule Commonly known as:  NEURONTIN Take 100-200 mg by mouth 3 (three) times daily as needed (breakthrough pain).   gabapentin 300 MG capsule Commonly known as:  NEURONTIN TAKE ONE CAPSULE BY MOUTH 3 TIMES A DAY   hydrochlorothiazide 25 MG tablet Commonly known as:  HYDRODIURIL Take 0.5 tablets (12.5 mg total) by mouth daily. What changed:  how much to take   MULTI-VITAMIN GUMMIES PO Take 2 tablets by mouth daily.   nitrofurantoin (macrocrystal-monohydrate) 100 MG capsule Commonly known as:  MACROBID Take 1 capsule by mouth daily as needed (for uti prophylaxis, after intercourse).   oxyCODONE-acetaminophen 10-325 MG tablet Commonly known as:  PERCOCET Take 1 tablet by mouth every 6 (six) hours as needed for pain.   pantoprazole 40 MG tablet Commonly known as:  PROTONIX Take 40 mg by mouth daily.   phenazopyridine 200 MG tablet Commonly known as:  PYRIDIUM Take 1 tablet  (200 mg total) by mouth 3 (three) times daily as needed for pain (urethral spasm).   phentermine 37.5 MG capsule Take 37.5 mg by mouth every morning.   potassium chloride 10 MEQ tablet Commonly known as:  KLOR-CON 10 Take 1 tablet (10 mEq total) by mouth daily.   promethazine 25 MG tablet Commonly known as:  PHENERGAN TAKE 1 TABLET BY MOUTH EVERY 6 HOURS AS NEEDED FOR NAUSEA   simethicone 80 MG chewable tablet Commonly known as:  MYLICON Chew 160 mg by mouth every 6 (six) hours as needed (gas pain).   sucralfate 1 GM/10ML suspension Commonly known as:  CARAFATE Take 10 mLs (1 g total) by mouth 4 (four) times daily -  with meals and at bedtime. What changed:  when to take this  reasons to take this   SUMAtriptan 100 MG tablet Commonly known as:  IMITREX Take 1 tablet earliest onset of migraine.  May repeat once in 2 hours if headache persists or recurs.   SYRINGE-NEEDLE (DISP) 3 ML 25G X 1" 3 ML Misc Commonly known as:  BD INTEGRA SYRINGE USE AS DIRECTED   Vitamin D (Ergocalciferol) 50000 units Caps capsule Commonly known as:  DRISDOL Take 1 capsule (50,000 Units total) by mouth every 7 (seven) days.      Follow-up Information    Harold Hedge, MD Follow up.   Specialty:  Obstetrics and Gynecology Why:  Call  for appointment as soon as possible, return to MAU as needed for gyn emergencies. Contact information: [address] [place] [address] [phone]        Neurosurgery Follow up.   Why:  As planned next week          Sharen Counter Certified Nurse-Midwife 11/13/2016  10:20 PM

## 2016-11-13 NOTE — MAU Note (Signed)
Pt is feeling something protruding from her vagina. Has a full bladder feeling/pressure. Feels like her bladder is full but it doesn't feel empty.

## 2016-11-13 NOTE — MAU Note (Signed)
Bladder is trying to come out of vagina.  Has been having a lot of back pain.  Has been getting frequent bladder infections and feeling of fullness, like it isn't emptying completely.  Painful sex.  Having pelvic pressure.  Can feel something protruding.  ( had hysterectomy in 2001)

## 2016-11-14 ENCOUNTER — Encounter: Payer: Self-pay | Admitting: Gastroenterology

## 2016-11-14 ENCOUNTER — Other Ambulatory Visit: Payer: Self-pay | Admitting: Family Medicine

## 2016-11-15 LAB — URINE CULTURE: Culture: <10000 — AB

## 2016-11-16 DIAGNOSIS — M4807 Spinal stenosis, lumbosacral region: Secondary | ICD-10-CM | POA: Diagnosis not present

## 2016-11-16 DIAGNOSIS — M5440 Lumbago with sciatica, unspecified side: Secondary | ICD-10-CM | POA: Diagnosis not present

## 2016-11-16 NOTE — Telephone Encounter (Signed)
Can we refill this? 

## 2016-11-16 NOTE — Telephone Encounter (Signed)
Call in #90 with 5 rf 

## 2016-11-17 ENCOUNTER — Other Ambulatory Visit: Payer: Self-pay | Admitting: Neurosurgery

## 2016-11-17 DIAGNOSIS — N760 Acute vaginitis: Secondary | ICD-10-CM | POA: Diagnosis not present

## 2016-11-17 DIAGNOSIS — R1031 Right lower quadrant pain: Secondary | ICD-10-CM | POA: Diagnosis not present

## 2016-11-17 DIAGNOSIS — M4807 Spinal stenosis, lumbosacral region: Secondary | ICD-10-CM

## 2016-11-18 ENCOUNTER — Other Ambulatory Visit: Payer: Self-pay

## 2016-11-18 MED ORDER — PANTOPRAZOLE SODIUM 40 MG PO TBEC: 40.0000 mg | DELAYED_RELEASE_TABLET | Freq: Every day | ORAL | 1 refills | Status: DC

## 2016-11-23 ENCOUNTER — Telehealth: Payer: Self-pay | Admitting: Family Medicine

## 2016-11-23 ENCOUNTER — Other Ambulatory Visit: Payer: Self-pay | Admitting: Family Medicine

## 2016-11-23 ENCOUNTER — Ambulatory Visit (INDEPENDENT_AMBULATORY_CARE_PROVIDER_SITE_OTHER): Payer: BLUE CROSS/BLUE SHIELD | Admitting: Licensed Clinical Social Worker

## 2016-11-23 DIAGNOSIS — F3341 Major depressive disorder, recurrent, in partial remission: Secondary | ICD-10-CM

## 2016-11-23 NOTE — Telephone Encounter (Signed)
Pt would like to know why the  phentermine 37.5 MG capsule Was refused.  Pt states she had refills left, but she never got it refilled and now they expired.  Pt would like a call back [id number] [place], [place] - [address]

## 2016-11-24 ENCOUNTER — Ambulatory Visit
Admission: RE | Admit: 2016-11-24 | Discharge: 2016-11-24 | Disposition: A | Discharge status: Home / Self Care | Payer: BLUE CROSS/BLUE SHIELD | Source: Ambulatory Visit | Attending: Neurosurgery | Admitting: Neurosurgery

## 2016-11-24 DIAGNOSIS — M545 Low back pain: Secondary | ICD-10-CM | POA: Diagnosis not present

## 2016-11-24 DIAGNOSIS — M4807 Spinal stenosis, lumbosacral region: Secondary | ICD-10-CM

## 2016-11-24 NOTE — Telephone Encounter (Signed)
I spoke with pt and she ask to just forward refill request to Dr. Clent RidgesFry.

## 2016-11-24 NOTE — Telephone Encounter (Signed)
I will have her follow up with Dr. Clent RidgesFry for this

## 2016-11-30 ENCOUNTER — Other Ambulatory Visit: Payer: Self-pay | Admitting: Family Medicine

## 2016-11-30 MED ORDER — PHENTERMINE HCL 37.5 MG PO CAPS: 37.5000 mg | ORAL_CAPSULE | ORAL | 5 refills | Status: DC

## 2016-11-30 NOTE — Telephone Encounter (Signed)
[facility] and sent pt a my chart message.  

## 2016-11-30 NOTE — Telephone Encounter (Signed)
Call in Phenetermine 37.5 mg daily, #30 with 5 rf

## 2016-12-07 ENCOUNTER — Ambulatory Visit (INDEPENDENT_AMBULATORY_CARE_PROVIDER_SITE_OTHER): Payer: BLUE CROSS/BLUE SHIELD | Admitting: Licensed Clinical Social Worker

## 2016-12-07 DIAGNOSIS — F3341 Major depressive disorder, recurrent, in partial remission: Secondary | ICD-10-CM | POA: Diagnosis not present

## 2016-12-08 ENCOUNTER — Inpatient Hospital Stay: Admission: RE | Admit: 2016-12-08 | Payer: Self-pay | Source: Ambulatory Visit

## 2016-12-15 ENCOUNTER — Encounter: Payer: Self-pay | Admitting: *Deleted

## 2016-12-21 ENCOUNTER — Ambulatory Visit (INDEPENDENT_AMBULATORY_CARE_PROVIDER_SITE_OTHER): Payer: BLUE CROSS/BLUE SHIELD | Admitting: Licensed Clinical Social Worker

## 2016-12-21 DIAGNOSIS — M797 Fibromyalgia: Secondary | ICD-10-CM | POA: Diagnosis not present

## 2016-12-21 DIAGNOSIS — F3341 Major depressive disorder, recurrent, in partial remission: Secondary | ICD-10-CM | POA: Diagnosis not present

## 2016-12-21 DIAGNOSIS — M545 Low back pain: Secondary | ICD-10-CM | POA: Diagnosis not present

## 2016-12-21 DIAGNOSIS — G43909 Migraine, unspecified, not intractable, without status migrainosus: Secondary | ICD-10-CM | POA: Diagnosis not present

## 2016-12-25 ENCOUNTER — Other Ambulatory Visit: Payer: Self-pay | Admitting: Family Medicine

## 2016-12-25 NOTE — Telephone Encounter (Signed)
Call in #90 with 5 rf 

## 2016-12-29 ENCOUNTER — Ambulatory Visit: Payer: Self-pay | Admitting: Internal Medicine

## 2016-12-29 ENCOUNTER — Other Ambulatory Visit: Payer: Self-pay | Admitting: Neurosurgery

## 2016-12-29 ENCOUNTER — Ambulatory Visit
Admission: RE | Admit: 2016-12-29 | Discharge: 2016-12-29 | Disposition: A | Discharge status: Home / Self Care | Payer: BLUE CROSS/BLUE SHIELD | Source: Ambulatory Visit | Attending: Neurosurgery | Admitting: Neurosurgery

## 2016-12-29 VITALS — BP 149/85 | HR 101 | Temp 98.1°F | Resp 15

## 2016-12-29 DIAGNOSIS — M4807 Spinal stenosis, lumbosacral region: Secondary | ICD-10-CM | POA: Diagnosis not present

## 2016-12-29 DIAGNOSIS — M48061 Spinal stenosis, lumbar region without neurogenic claudication: Secondary | ICD-10-CM

## 2016-12-29 MED ORDER — SODIUM CHLORIDE 0.9 % IV SOLN
Freq: Once | INTRAVENOUS | Status: AC
  Administered 2016-12-29: 08:00:00 via INTRAVENOUS

## 2016-12-29 MED ORDER — MIDAZOLAM HCL 2 MG/2ML IJ SOLN
1.0000 mg | INTRAMUSCULAR | Status: DC | PRN
  Administered 2016-12-29: 1 mg via INTRAVENOUS
  Administered 2016-12-29 (×2): 0.5 mg via INTRAVENOUS

## 2016-12-29 MED ORDER — FENTANYL CITRATE (PF) 100 MCG/2ML IJ SOLN: 25.0000 ug | INTRAMUSCULAR | Status: DC | PRN

## 2016-12-29 MED ORDER — KETOROLAC TROMETHAMINE 30 MG/ML IJ SOLN
30.0000 mg | Freq: Once | INTRAMUSCULAR | Status: AC
  Administered 2016-12-29: 30 mg via INTRAVENOUS

## 2016-12-29 NOTE — Discharge Instructions (Signed)
[facility] Procedure Discharge Instructions  1. May resume a regular diet and any medications that you routinely take (including pain medications). 2. No driving day of procedure. 3. Upon discharge go home and rest for at least 4 hours.  May use an ice pack as needed to injection sites on back. 4. Remove bandades later, today.    Please contact our office at [phone] for the following symptoms:   Fever greater than 100 degrees  Increased swelling, pain, or redness at injection site.   [facility].

## 2017-01-04 ENCOUNTER — Ambulatory Visit: Payer: BLUE CROSS/BLUE SHIELD | Admitting: Licensed Clinical Social Worker

## 2017-01-05 DIAGNOSIS — F331 Major depressive disorder, recurrent, moderate: Secondary | ICD-10-CM | POA: Diagnosis not present

## 2017-01-05 DIAGNOSIS — F411 Generalized anxiety disorder: Secondary | ICD-10-CM | POA: Diagnosis not present

## 2017-01-06 DIAGNOSIS — Z713 Dietary counseling and surveillance: Secondary | ICD-10-CM | POA: Diagnosis not present

## 2017-01-07 DIAGNOSIS — G43909 Migraine, unspecified, not intractable, without status migrainosus: Secondary | ICD-10-CM | POA: Diagnosis not present

## 2017-01-07 DIAGNOSIS — M545 Low back pain: Secondary | ICD-10-CM | POA: Diagnosis not present

## 2017-01-07 DIAGNOSIS — M797 Fibromyalgia: Secondary | ICD-10-CM | POA: Diagnosis not present

## 2017-01-11 DIAGNOSIS — F331 Major depressive disorder, recurrent, moderate: Secondary | ICD-10-CM | POA: Diagnosis not present

## 2017-01-11 DIAGNOSIS — F411 Generalized anxiety disorder: Secondary | ICD-10-CM | POA: Diagnosis not present

## 2017-01-12 DIAGNOSIS — M4807 Spinal stenosis, lumbosacral region: Secondary | ICD-10-CM | POA: Diagnosis not present

## 2017-01-12 DIAGNOSIS — M47816 Spondylosis without myelopathy or radiculopathy, lumbar region: Secondary | ICD-10-CM | POA: Diagnosis not present

## 2017-01-12 DIAGNOSIS — M961 Postlaminectomy syndrome, not elsewhere classified: Secondary | ICD-10-CM | POA: Diagnosis not present

## 2017-01-13 ENCOUNTER — Ambulatory Visit (INDEPENDENT_AMBULATORY_CARE_PROVIDER_SITE_OTHER): Payer: BLUE CROSS/BLUE SHIELD | Admitting: Internal Medicine

## 2017-01-13 VITALS — BP 122/90 | HR 104 | Ht 69.0 in | Wt 266.0 lb

## 2017-01-13 DIAGNOSIS — I10 Essential (primary) hypertension: Secondary | ICD-10-CM | POA: Diagnosis not present

## 2017-01-13 DIAGNOSIS — R000 Tachycardia, unspecified: Secondary | ICD-10-CM

## 2017-01-13 DIAGNOSIS — I951 Orthostatic hypotension: Secondary | ICD-10-CM | POA: Diagnosis not present

## 2017-01-13 DIAGNOSIS — Z01810 Encounter for preprocedural cardiovascular examination: Secondary | ICD-10-CM

## 2017-01-13 NOTE — Progress Notes (Signed)
Patient Care Team: [name], MD as PCP - General   HPI  [name] is a 41 y.o. female  Seen in follow-up after a hiatus of a couple of years. She is seen AS -NP SW-PA the interim. She has a history of sinus tachycardia  Evaluation has included a normal TSH, hemoglobin, surgery clinic chronic connective tissue evaluation was negative. Urine for catecholamines is abnormal but CT was negative. Follow. Studies were normal. Echocardiogram has been normal.   she has not tolerated beta blockers in the past his fatigue or loss. Including metoprolol succinate/atenolol  he has also been on verapamil and diltiazem the past.  Her echo ( As Below ) showed small chamber size>> she has heat intolerance, shower intolerance and some Orthostasis  Diet is fluid deplete, she is s/p hysterectomy  Records and Results Reviewed* Echocardiogram 4/18 shows normal LV function**and if anything small chamber size.  She has need for further back surgery-- she is able to climb stairs albeit slowly   Past Medical History:  Diagnosis Date  . Allergy   . Anxiety   . B12 deficiency   . Depression    not currently   . Dysrhythmia    hx tachy-was from medication nortriptylline  . Esophagitis    rx  . Fibromyalgia   . GERD (gastroesophageal reflux disease)   . History of echocardiogram    Echo 4/18: Mild concentric LVH, vigorous LVF, EF 65-70, normal wall motion, grade 1 diastolic dysfunction  . History of kidney stones   . Hyperlipidemia   . Intervertebral disc protrusion 01/11/2014  . Migraine   . Obesity (BMI 35.0-39.9 without comorbidity)   . Peripheral neuropathy   . Polycystic ovaries   . Spinal stenosis, lumbar     Past Surgical History:  Procedure Laterality Date  . 24 HOUR PH STUDY N/A 05/27/2016   Procedure: 24 HOUR PH STUDY;  Surgeon: Ruffin Frederick, MD;  Location: [facility];  Service: Gastroenterology;  Laterality: N/A;  . APPENDECTOMY    . BACK SURGERY  2012     left laminectomy at L3-4 per Dr. Phoebe Perch   . bladder tack  2012  . CHOLECYSTECTOMY N/A 10/03/2015   Procedure: LAPAROSCOPIC CHOLECYSTECTOMY;  Surgeon: Rodman Pickle, MD;  Location: [facility] OR;  Service: General;  Laterality: N/A;  . ESOPHAGEAL MANOMETRY N/A 05/27/2016   Procedure: ESOPHAGEAL MANOMETRY (EM);  Surgeon: Ruffin Frederick, MD;  Location: [facility];  Service: Gastroenterology;  Laterality: N/A;  . EXCISION OF SKIN TAG  10/03/2015   Procedure: EXCISION OF SKIN TAG;  Surgeon: De Blanch Kinsinger, MD;  Location: [facility] OR;  Service: General;;  . LUMBAR DISC SURGERY  01-31-14   per Dr. [name]   . OOPHORECTOMY     left overy  . OVARIAN CYST REMOVAL    . RECTOCELE REPAIR    . SINUSOTOMY  12/14/06  . spinal fusion  2016/10  . TONSILLECTOMY    . TONSILLECTOMY    . VAGINAL HYSTERECTOMY    . WRIST GANGLION EXCISION Left     Current Outpatient Prescriptions  Medication Sig Dispense Refill  . ALPRAZolam (XANAX) 0.5 MG tablet TAKE 1 TABLET BY MOUTH 3 TIMES A DAY AS NEEDED 90 tablet 5  . calcium carbonate (TUMS - DOSED IN MG ELEMENTAL CALCIUM) 500 MG chewable tablet Chew 1 tablet by mouth 2 (two) times daily as needed for indigestion or heartburn.    . cyanocobalamin (,VITAMIN B-12,) 1000 MCG/ML injection INJECT 1 ML (  1,000 MCG TOTAL) INTO THE MUSCLE EVERY 7 DAYS. 12 mL 3  . fluconazole (DIFLUCAN) 150 MG tablet Take 1 tablet by mouth daily as needed (yeast infection).   5  . fluticasone (FLONASE) 50 MCG/ACT nasal spray Place 2 sprays into both nostrils daily as needed for allergies.     Marland Kitchen. gabapentin (NEURONTIN) 100 MG capsule Take 100-200 mg by mouth 3 (three) times daily as needed (breakthrough pain).   0  . gabapentin (NEURONTIN) 300 MG capsule TAKE ONE CAPSULE BY MOUTH 3 TIMES A DAY 90 capsule 5  . Multiple Vitamins-Minerals (MULTI-VITAMIN GUMMIES PO) Take 2 tablets by mouth daily.     . nitrofurantoin, macrocrystal-monohydrate, (MACROBID) 100 MG capsule Take 1 capsule by mouth daily  as needed (for uti prophylaxis, after intercourse).   2  . oxyCODONE-acetaminophen (PERCOCET) 10-325 MG per tablet Take 1 tablet by mouth every 6 (six) hours as needed for pain. 60 tablet 0  . pantoprazole (PROTONIX) 40 MG tablet Take 1 tablet (40 mg total) by mouth daily. 30 tablet 1  . phenazopyridine (PYRIDIUM) 200 MG tablet Take 1 tablet (200 mg total) by mouth 3 (three) times daily as needed for pain (urethral spasm). 10 tablet 1  . phentermine 37.5 MG capsule Take 1 capsule (37.5 mg total) by mouth every morning. 30 capsule 5  . potassium chloride (KLOR-CON 10) 10 MEQ tablet Take 1 tablet (10 mEq total) by mouth daily. 90 tablet 3  . promethazine (PHENERGAN) 25 MG tablet TAKE 1 TABLET BY MOUTH EVERY 6 HOURS AS NEEDED FOR NAUSEA 90 tablet 1  . rosuvastatin (CRESTOR) 20 MG tablet Take 20 mg by mouth daily.    . simethicone (MYLICON) 80 MG chewable tablet Chew 160 mg by mouth every 6 (six) hours as needed (gas pain).    . sucralfate (CARAFATE) 1 GM/10ML suspension Take 10 mLs (1 g total) by mouth 4 (four) times daily -  with meals and at bedtime. (Patient taking differently: Take 1 g by mouth 3 (three) times daily as needed (gi issues). ) 420 mL 2  . SUMAtriptan (IMITREX) 100 MG tablet Take 1 tablet earliest onset of migraine.  May repeat once in 2 hours if headache persists or recurs. 10 tablet 2  . SYRINGE-NEEDLE, DISP, 3 ML (BD INTEGRA SYRINGE) 25G X 1" 3 ML MISC USE AS DIRECTED 50 each 1  . Vitamin D, Ergocalciferol, (DRISDOL) 50000 units CAPS capsule Take 1 capsule (50,000 Units total) by mouth every 7 (seven) days. 12 capsule 3  . hydrochlorothiazide (HYDRODIURIL) 25 MG tablet Take 0.5 tablets (12.5 mg total) by mouth daily. (Patient taking differently: Take 25 mg by mouth daily. ) 90 tablet 3   No current facility-administered medications for this visit.     Allergies  Allergen Reactions  . Gadolinium Derivatives Swelling and Other (See Comments)    Arm swelling, tingling, & redness.  Radiologist Dr. Grace IsaacWatts recommends an injection of steroids, or 13-hour prep if non-emergent, if patient needs a gadolinium based contrast in the future.  Marland Kitchen. Zoloft [Sertraline Hcl] Other (See Comments)    Hair loss      Review of Systems negative except from HPI and PMH  Physical Exam BP 122/90   Pulse (!) 104   Ht 5\' 9"  (1.753 m)   Wt 266 lb (120.7 kg)   LMP 12/13/1999 Comment: single oophorectomy//a.c.  BMI 39.28 kg/m  Well developed and nourished in no acute distress HENT normal Neck supple with JVP-flat Carotids brisk and full without bruits Clear  Regular rate and rhythm, no murmurs or gallops Abd-soft with active BS without hepatomegaly No Clubbing cyanosis edema Skin-warm and dry A & Oriented  Grossly normal sensory and motor function   ECG demonstrates sinus rhythm at 104 Intervals 05/08/34   Assessment and Plan  Tachycardia-sinus  Hypertension  Orthostatic intolerance  Stress/depression   Preop eval   She continues with sinus tach  Some objective evidence of OI today, and with symptoms wonder if not mixed component of orhtostasis and inappropriate sinus tach  Her small echo chamber size suggests that fluid repletion might be of benefit--she says "i dont like to drink" but will work on fluid repletion   Encouraged water exercise, in particular with back issue," I am not accountable to anyone to exercise as I am not paying for it."  Surgical risks should be acceptable  Attention is necessary for fluid status  More than 50% of 40 min was spent in counseling related to the above   Current medicines are reviewed at length with the patient today .  The patient does not have concerns regarding medicines.

## 2017-01-13 NOTE — Patient Instructions (Signed)
Your physician recommends that you continue on your current medications as directed. Please refer to the Current Medication list given to you today.  Your physician recommends that you schedule a follow-up appointment in: 3 months with Gypsy BalsamAmber Seiler, NP

## 2017-01-14 DIAGNOSIS — M545 Low back pain: Secondary | ICD-10-CM | POA: Diagnosis not present

## 2017-01-14 DIAGNOSIS — M797 Fibromyalgia: Secondary | ICD-10-CM | POA: Diagnosis not present

## 2017-01-14 DIAGNOSIS — G43909 Migraine, unspecified, not intractable, without status migrainosus: Secondary | ICD-10-CM | POA: Diagnosis not present

## 2017-01-20 ENCOUNTER — Ambulatory Visit: Payer: BLUE CROSS/BLUE SHIELD | Admitting: Licensed Clinical Social Worker

## 2017-01-27 DIAGNOSIS — F411 Generalized anxiety disorder: Secondary | ICD-10-CM | POA: Diagnosis not present

## 2017-01-27 DIAGNOSIS — F331 Major depressive disorder, recurrent, moderate: Secondary | ICD-10-CM | POA: Diagnosis not present

## 2017-01-28 DIAGNOSIS — G43909 Migraine, unspecified, not intractable, without status migrainosus: Secondary | ICD-10-CM | POA: Diagnosis not present

## 2017-01-28 DIAGNOSIS — F411 Generalized anxiety disorder: Secondary | ICD-10-CM | POA: Diagnosis not present

## 2017-01-28 DIAGNOSIS — M797 Fibromyalgia: Secondary | ICD-10-CM | POA: Diagnosis not present

## 2017-01-28 DIAGNOSIS — M545 Low back pain: Secondary | ICD-10-CM | POA: Diagnosis not present

## 2017-01-29 ENCOUNTER — Other Ambulatory Visit: Payer: Self-pay | Admitting: Neurosurgery

## 2017-02-02 DIAGNOSIS — F411 Generalized anxiety disorder: Secondary | ICD-10-CM | POA: Diagnosis not present

## 2017-02-02 DIAGNOSIS — F331 Major depressive disorder, recurrent, moderate: Secondary | ICD-10-CM | POA: Diagnosis not present

## 2017-02-03 ENCOUNTER — Telehealth: Payer: Self-pay | Admitting: Vascular Surgery

## 2017-02-03 ENCOUNTER — Ambulatory Visit (INDEPENDENT_AMBULATORY_CARE_PROVIDER_SITE_OTHER): Payer: BLUE CROSS/BLUE SHIELD | Admitting: Licensed Clinical Social Worker

## 2017-02-03 DIAGNOSIS — Z713 Dietary counseling and surveillance: Secondary | ICD-10-CM | POA: Diagnosis not present

## 2017-02-03 DIAGNOSIS — F3341 Major depressive disorder, recurrent, in partial remission: Secondary | ICD-10-CM

## 2017-02-03 NOTE — Telephone Encounter (Signed)
-----   Message from Phillips Odorarol S Pullins, RN sent at 02/02/2017  3:57 PM EDT ----- Regarding: needs appt. with Dr. Arbie CookeyEarly Please schedule for a new pt. Consult with Dr. Arbie CookeyEarly prior to ALIF on 03/17/17.  Please remind the pt. To bring copy of LS spine films to the appt.

## 2017-02-03 NOTE — Telephone Encounter (Signed)
Sched appt 03/02/17 at 8:30. Lm on cell#.

## 2017-02-10 ENCOUNTER — Other Ambulatory Visit: Payer: Self-pay

## 2017-02-15 DIAGNOSIS — M722 Plantar fascial fibromatosis: Secondary | ICD-10-CM | POA: Diagnosis not present

## 2017-02-15 DIAGNOSIS — M542 Cervicalgia: Secondary | ICD-10-CM | POA: Diagnosis not present

## 2017-02-15 DIAGNOSIS — M5440 Lumbago with sciatica, unspecified side: Secondary | ICD-10-CM | POA: Diagnosis not present

## 2017-02-16 ENCOUNTER — Other Ambulatory Visit: Payer: Self-pay

## 2017-02-16 ENCOUNTER — Encounter: Payer: Self-pay | Admitting: Gastroenterology

## 2017-02-16 MED ORDER — PANTOPRAZOLE SODIUM 40 MG PO TBEC: 40.0000 mg | DELAYED_RELEASE_TABLET | Freq: Two times a day (BID) | ORAL | 3 refills | Status: DC

## 2017-02-17 ENCOUNTER — Ambulatory Visit (INDEPENDENT_AMBULATORY_CARE_PROVIDER_SITE_OTHER): Payer: BLUE CROSS/BLUE SHIELD | Admitting: Licensed Clinical Social Worker

## 2017-02-17 ENCOUNTER — Other Ambulatory Visit: Payer: Self-pay | Admitting: Neurosurgery

## 2017-02-17 ENCOUNTER — Telehealth: Payer: Self-pay

## 2017-02-17 DIAGNOSIS — F3341 Major depressive disorder, recurrent, in partial remission: Secondary | ICD-10-CM | POA: Diagnosis not present

## 2017-02-17 DIAGNOSIS — M542 Cervicalgia: Secondary | ICD-10-CM

## 2017-02-17 NOTE — Telephone Encounter (Signed)
[facility]16[facility]17.  Received fax notification from [facility] that a Prior Authorization is required. Submitted request via CoverMyMeds on 02-17-17.

## 2017-02-18 ENCOUNTER — Encounter: Payer: Self-pay | Admitting: Family Medicine

## 2017-02-18 ENCOUNTER — Other Ambulatory Visit: Payer: Self-pay | Admitting: Family Medicine

## 2017-02-19 ENCOUNTER — Other Ambulatory Visit: Payer: Self-pay | Admitting: Family Medicine

## 2017-02-22 DIAGNOSIS — F411 Generalized anxiety disorder: Secondary | ICD-10-CM | POA: Diagnosis not present

## 2017-02-22 DIAGNOSIS — F331 Major depressive disorder, recurrent, moderate: Secondary | ICD-10-CM | POA: Diagnosis not present

## 2017-02-22 NOTE — Telephone Encounter (Signed)
Additional information requested and submitted to CoverMyMeds 02-22-17.

## 2017-02-22 NOTE — Telephone Encounter (Signed)
Prior Auth for pantoprazole  BID Approved: CoverMyMeds:  CaseId:[id number];Status:Approved;Review Type:Qty;Coverage Start Date:02/22/2017;Coverage End Date:02/22/2018  Called and notified [facility].  They said it went through.  Called and LM for patient regarding approval.

## 2017-03-01 ENCOUNTER — Ambulatory Visit
Admission: RE | Admit: 2017-03-01 | Discharge: 2017-03-01 | Disposition: A | Discharge status: Home / Self Care | Payer: BLUE CROSS/BLUE SHIELD | Source: Ambulatory Visit | Attending: Neurosurgery | Admitting: Neurosurgery

## 2017-03-01 DIAGNOSIS — M5126 Other intervertebral disc displacement, lumbar region: Secondary | ICD-10-CM | POA: Diagnosis not present

## 2017-03-01 DIAGNOSIS — M542 Cervicalgia: Secondary | ICD-10-CM | POA: Diagnosis not present

## 2017-03-02 ENCOUNTER — Encounter: Payer: Self-pay | Admitting: Vascular Surgery

## 2017-03-02 ENCOUNTER — Ambulatory Visit: Payer: Self-pay | Admitting: Neurology

## 2017-03-02 ENCOUNTER — Ambulatory Visit (INDEPENDENT_AMBULATORY_CARE_PROVIDER_SITE_OTHER): Payer: BLUE CROSS/BLUE SHIELD | Admitting: Vascular Surgery

## 2017-03-02 VITALS — BP 122/87 | HR 107 | Temp 97.2°F | Resp 16 | Ht 69.0 in | Wt 259.0 lb

## 2017-03-02 DIAGNOSIS — M5137 Other intervertebral disc degeneration, lumbosacral region: Secondary | ICD-10-CM

## 2017-03-02 NOTE — Progress Notes (Signed)
Vascular and Vein Specialist of [facility]  Patient name: [name] MRN: [id number] DOB: [birth year] Sex: female  REASON FOR CONSULT: Discussed my role in anterior exposure for L5-S1 disc disease  HPI: [name] is a 41 y.o. female, who is here today for discussion of upcoming anterior exposure for L5-S1 disc fusion with Dr.Cram.  She has a history of prior posterior approach back surgery with fusion. She's had persistent back pain and is now being considered for anterior exposure for L5-S1 fusion. She has failed conservative therapy with steroid injections. Reports that she is has constant low back pain and also left leg radicular symptoms. She has surgery scheduled for 03/17/2017. She is here today with her husband for discussion.  She has had multiple prior abdominal surgeries to include open appendectomy, laparoscopic cholecystectomy, multiple left ovarian cyst treatments and finally left oophorectomy and hysterectomy through a low transverse incision.  She has no history of cardiac disease. No history of peripheral vascular occlusive disease. She is morbidly obese. Past Medical History:  Diagnosis Date  . Allergy   . Anxiety   . B12 deficiency   . Depression    not currently   . Dysrhythmia    hx tachy-was from medication nortriptylline  . Esophagitis    rx  . Fibromyalgia   . GERD (gastroesophageal reflux disease)   . History of echocardiogram    Echo 4/18: Mild concentric LVH, vigorous LVF, EF 65-70, normal wall motion, grade 1 diastolic dysfunction  . History of kidney stones   . Hyperlipidemia   . Intervertebral disc protrusion 01/11/2014  . Migraine   . Obesity (BMI 35.0-39.9 without comorbidity)   . Peripheral neuropathy   . Polycystic ovaries   . Spinal stenosis, lumbar     Family History  Problem Relation Age of Onset  . Fibromyalgia Mother   . Diabetes Mother   . Thyroid cancer Mother   . Liver disease Mother   .  Neuropathy Mother   . Cirrhosis Mother        non-alcoholic  . Pulmonary embolism Mother        both lungs  . Hypertension Father   . Diabetes Father   . Heart disease Father   . Prostate cancer Father   . Heart attack Father        x 2  . Colon cancer Neg Hx   . Other Neg Hx        pheochromocytoma  . Colon polyps Neg Hx     SOCIAL HISTORY: Social History   Social History  . Marital status: Married    Spouse name: N/A  . Number of children: 2  . Years of education: N/A   Occupational History  . housewife    Social History Main Topics  . Smoking status: Former Smoker    Types: Cigarettes    Quit date: 06/01/1998  . Smokeless tobacco: Never Used  . Alcohol use No  . Drug use: No  . Sexual activity: Yes    Birth control/ protection: Surgical   Other Topics Concern  . Not on file   Social History Narrative  . No narrative on file    Allergies  Allergen Reactions  . Gadolinium Derivatives Swelling and Other (See Comments)    Arm swelling, tingling, & redness. Radiologist Dr. Grace Isaac recommends an injection of steroids, or 13-hour prep if non-emergent, if patient needs a gadolinium based contrast in the future.  Marland Kitchen Zoloft [Sertraline Hcl] Other (See Comments)  Hair loss    Current Outpatient Prescriptions  Medication Sig Dispense Refill  . ALPRAZolam (XANAX) 0.5 MG tablet TAKE 1 TABLET BY MOUTH 3 TIMES A DAY AS NEEDED 90 tablet 5  . calcium carbonate (TUMS - DOSED IN MG ELEMENTAL CALCIUM) 500 MG chewable tablet Chew 1 tablet by mouth 2 (two) times daily as needed for indigestion or heartburn.    . cyanocobalamin (,VITAMIN B-12,) 1000 MCG/ML injection INJECT 1 ML (1,000 MCG TOTAL) INTO THE MUSCLE EVERY 7 DAYS. 12 mL 3  . fluticasone (FLONASE) 50 MCG/ACT nasal spray Place 2 sprays into both nostrils daily as needed for allergies.     Marland Kitchen gabapentin (NEURONTIN) 100 MG capsule Take 100-200 mg by mouth 3 (three) times daily as needed (breakthrough pain).   0  .  gabapentin (NEURONTIN) 300 MG capsule TAKE ONE CAPSULE BY MOUTH 3 TIMES A DAY 90 capsule 5  . hydrochlorothiazide (HYDRODIURIL) 25 MG tablet Take 12.5 mg by mouth daily.  3  . methylPREDNISolone (MEDROL DOSEPAK) 4 MG TBPK tablet See admin instructions.  0  . Multiple Vitamins-Minerals (MULTI-VITAMIN GUMMIES PO) Take 2 tablets by mouth daily.     . nitrofurantoin, macrocrystal-monohydrate, (MACROBID) 100 MG capsule Take 1 capsule by mouth daily as needed (for uti prophylaxis, after intercourse).   2  . oxyCODONE-acetaminophen (PERCOCET) 10-325 MG per tablet Take 1 tablet by mouth every 6 (six) hours as needed for pain. 60 tablet 0  . pantoprazole (PROTONIX) 40 MG tablet Take 1 tablet (40 mg total) by mouth 2 (two) times daily. 180 tablet 3  . phenazopyridine (PYRIDIUM) 200 MG tablet Take 1 tablet (200 mg total) by mouth 3 (three) times daily as needed for pain (urethral spasm). 10 tablet 1  . phentermine 37.5 MG capsule Take 1 capsule (37.5 mg total) by mouth every morning. 30 capsule 5  . potassium chloride (KLOR-CON 10) 10 MEQ tablet Take 1 tablet (10 mEq total) by mouth daily. 90 tablet 3  . promethazine (PHENERGAN) 25 MG tablet TAKE 1 TABLET BY MOUTH EVERY 6 HOURS AS NEEDED FOR NAUSEA 90 tablet 1  . simethicone (MYLICON) 80 MG chewable tablet Chew 160 mg by mouth every 6 (six) hours as needed (gas pain).    . sucralfate (CARAFATE) 1 GM/10ML suspension Take 10 mLs (1 g total) by mouth 4 (four) times daily -  with meals and at bedtime. (Patient taking differently: Take 1 g by mouth 3 (three) times daily as needed (gi issues). ) 420 mL 2  . SUMAtriptan (IMITREX) 100 MG tablet Take 1 tablet earliest onset of migraine.  May repeat once in 2 hours if headache persists or recurs. 10 tablet 2  . SYRINGE-NEEDLE, DISP, 3 ML (BD INTEGRA SYRINGE) 25G X 1" 3 ML MISC USE AS DIRECTED 50 each 1  . Vitamin D, Ergocalciferol, (DRISDOL) 50000 units CAPS capsule Take 1 capsule (50,000 Units total) by mouth every 7  (seven) days. 12 capsule 3  . fluconazole (DIFLUCAN) 150 MG tablet Take 1 tablet by mouth daily as needed (yeast infection).   5  . fluconazole (DIFLUCAN) 150 MG tablet TAKE 1 TABLET (150 MG TOTAL) BY MOUTH DAILY. (Patient not taking: Reported on 03/02/2017) 5 tablet 5  . hydrochlorothiazide (HYDRODIURIL) 25 MG tablet Take 0.5 tablets (12.5 mg total) by mouth daily. (Patient taking differently: Take 25 mg by mouth daily. ) 90 tablet 3  . rosuvastatin (CRESTOR) 20 MG tablet Take 20 mg by mouth daily.     No current facility-administered medications for  this visit.     REVIEW OF SYSTEMS:   denotes positive finding,  denotes negative finding Cardiac  Comments:  Chest pain or chest pressure:    Shortness of breath upon exertion: x   Short of breath when lying flat:    Irregular heart rhythm: x       Vascular    Pain in calf, thigh, or hip brought on by ambulation: x   Pain in feet at night that wakes you up from your sleep:  x   Blood clot in your veins:    Leg swelling:         Pulmonary    Oxygen at home:    Productive cough:     Wheezing:         Neurologic    Sudden weakness in arms or legs:     Sudden numbness in arms or legs:     Sudden onset of difficulty speaking or slurred speech:    Temporary loss of vision in one eye:     Problems with dizziness:         Gastrointestinal    Blood in stool:     Vomited blood:         Genitourinary    Burning when urinating:     Blood in urine:        Psychiatric    Major depression:         Hematologic    Bleeding problems:    Problems with blood clotting too easily:        Skin    Rashes or ulcers:        Constitutional    Fever or chills:      PHYSICAL EXAM: Vitals:   03/02/17 0859  BP: 122/87  Pulse: (!) 107  Resp: 16  Temp: (!) 97.2 F (36.2 C)  SpO2: 97%  Weight: 259 lb (117.5 kg)  Height:  (1.753 m)    GENERAL: The patient is a well-nourished female, in no acute distress. The vital signs are  documented above. CARDIOVASCULAR: 2+ radial 2+ dorsalis pedis pulses bilaterally. PULMONARY: There is good air exchange  ABDOMEN: Soft and non-tender  MUSCULOSKELETAL: There are no major deformities or cyanosis. NEUROLOGIC: No focal weakness or paresthesias are detected. SKIN: There are no ulcers or rashes noted. PSYCHIATRIC: The patient has a normal affect.  DATA:  I did review her CT scan from yesterday. This was of her lumbar region. This shows no evidence of calcification of her aortoiliac segments  MEDICAL ISSUES:  Had long discussion with the patient and her husband present. I explained that Dr. [name] has recommended L5-S1 fusion for treatment of her back pain and has recommended anterior approach. I explained my role in exposure. Explain mobilization of the rectus muscle, intraperitoneal contents, left ureter and arterial venous structures overlying the spine. Also explained the potential for injury to all of these. I did explain that with her multiple prior abdominal surgeries that she may have a great deal of adhesions making exposure somewhat more difficult and also her obesity does admit to the difficulty the procedures well. I do not feel any of these are prohibitive risk for anterior exposure. She understands and is ready to proceed with surgery   Larina Earthly, MD [facility] Vascular and Vein Specialists of [facility] Tel [phone] Pager [phone]

## 2017-03-03 ENCOUNTER — Ambulatory Visit (INDEPENDENT_AMBULATORY_CARE_PROVIDER_SITE_OTHER): Payer: BLUE CROSS/BLUE SHIELD | Admitting: Licensed Clinical Social Worker

## 2017-03-03 DIAGNOSIS — F3341 Major depressive disorder, recurrent, in partial remission: Secondary | ICD-10-CM

## 2017-03-04 DIAGNOSIS — Z01419 Encounter for gynecological examination (general) (routine) without abnormal findings: Secondary | ICD-10-CM | POA: Diagnosis not present

## 2017-03-04 DIAGNOSIS — Z1231 Encounter for screening mammogram for malignant neoplasm of breast: Secondary | ICD-10-CM | POA: Diagnosis not present

## 2017-03-04 DIAGNOSIS — Z6837 Body mass index (BMI) 37.0-37.9, adult: Secondary | ICD-10-CM | POA: Diagnosis not present

## 2017-03-09 ENCOUNTER — Encounter (HOSPITAL_COMMUNITY): Payer: Self-pay

## 2017-03-09 ENCOUNTER — Encounter (HOSPITAL_COMMUNITY)
Admission: RE | Admit: 2017-03-09 | Discharge: 2017-03-09 | Disposition: A | Discharge status: Home / Self Care | Payer: BLUE CROSS/BLUE SHIELD | Source: Ambulatory Visit | Attending: Neurosurgery | Admitting: Neurosurgery

## 2017-03-09 DIAGNOSIS — Z79899 Other long term (current) drug therapy: Secondary | ICD-10-CM | POA: Diagnosis not present

## 2017-03-09 DIAGNOSIS — M545 Low back pain: Secondary | ICD-10-CM | POA: Insufficient documentation

## 2017-03-09 DIAGNOSIS — Z888 Allergy status to other drugs, medicaments and biological substances status: Secondary | ICD-10-CM | POA: Insufficient documentation

## 2017-03-09 DIAGNOSIS — F331 Major depressive disorder, recurrent, moderate: Secondary | ICD-10-CM | POA: Diagnosis not present

## 2017-03-09 DIAGNOSIS — Z79891 Long term (current) use of opiate analgesic: Secondary | ICD-10-CM | POA: Insufficient documentation

## 2017-03-09 DIAGNOSIS — Z87891 Personal history of nicotine dependence: Secondary | ICD-10-CM | POA: Diagnosis not present

## 2017-03-09 DIAGNOSIS — F411 Generalized anxiety disorder: Secondary | ICD-10-CM | POA: Diagnosis not present

## 2017-03-09 DIAGNOSIS — M48061 Spinal stenosis, lumbar region without neurogenic claudication: Secondary | ICD-10-CM | POA: Insufficient documentation

## 2017-03-09 DIAGNOSIS — Z01812 Encounter for preprocedural laboratory examination: Secondary | ICD-10-CM | POA: Insufficient documentation

## 2017-03-09 HISTORY — DX: Unspecified osteoarthritis, unspecified site: M19.90

## 2017-03-09 HISTORY — DX: Vitamin D deficiency, unspecified: E55.9

## 2017-03-09 LAB — CBC
HCT: 43.5 % (ref 36.0–46.0)
Hemoglobin: 14.9 g/dL (ref 12.0–15.0)
MCH: 30.2 pg (ref 26.0–34.0)
MCHC: 34.3 g/dL (ref 30.0–36.0)
MCV: 88.1 fL (ref 78.0–100.0)
Platelets: 332 10*3/uL (ref 150–400)
RBC: 4.94 MIL/uL (ref 3.87–5.11)
RDW: 12.6 % (ref 11.5–15.5)
WBC: 11.2 10*3/uL — ABNORMAL HIGH (ref 4.0–10.5)

## 2017-03-09 LAB — TYPE AND SCREEN
ABO/RH(D): O POS
Antibody Screen: NEGATIVE

## 2017-03-09 LAB — BASIC METABOLIC PANEL
Anion gap: 12 (ref 5–15)
BUN: 11 mg/dL (ref 6–20)
CO2: 26 mmol/L (ref 22–32)
Calcium: 9.5 mg/dL (ref 8.9–10.3)
Chloride: 101 mmol/L (ref 101–111)
Creatinine, Ser: 1.13 mg/dL — ABNORMAL HIGH (ref 0.44–1.00)
GFR calc Af Amer: >60 mL/min (ref >60)
GFR calc non Af Amer: 59 mL/min — ABNORMAL LOW (ref >60)
Glucose, Bld: 103 mg/dL — ABNORMAL HIGH (ref 65–99)
Potassium: 3.7 mmol/L (ref 3.5–5.1)
Sodium: 139 mmol/L (ref 135–145)

## 2017-03-09 LAB — SURGICAL PCR SCREEN
MRSA, PCR: NEGATIVE
Staphylococcus aureus: NEGATIVE

## 2017-03-09 NOTE — Progress Notes (Signed)
Cardiologist Dr. Thurston Hole       Cardiac Clearance in [facility]

## 2017-03-09 NOTE — Pre-Procedure Instructions (Signed)
Basya Krah  03/09/2017      [id number] [address] [place] [address] Phone: [phone] Fax: [phone]    Your procedure is scheduled on 03-17-3017 Wednesday   Report to [facility] Admitting at 6:30  A.M .  Call this number if you have problems the morning of surgery:  [phone]   Remember:  Do not eat food or drink liquids after midnight.   Take these medicines the morning of surgery with A SIP OF WATER Alprazolam(Xanax) if needed,Flonase nasal spray,Gabapentin(Neurontin),Oxycodone for pain if needed,pantoprazole(Protonix),promethazine(Phenergan) if needed   HOLD PHENTERMINE 5 TO 7 DAYS PRIOR TO SURGERY  STOP ASPIRIN,ANTIINFLAMATORIES (IBUPROFEN,ALEVE,MOTRIN,ADVIL,GOODY'S POWDERS),HERBAL SUPPLEMENTS,FISH OIL,AND VITAMINS 5-7 DAYS PRIOR TO SURGERY     Do not wear jewelry, make-up or nail polish.  Do not wear lotions, powders, or perfumes, or deoderant.  Do not shave 48 hours prior to surgery.  Men may shave face and neck.  Do not bring valuables to the hospital.  [facility] is not responsible for any belongings or valuables.  Contacts, dentures or bridgework may not be worn into surgery.  Leave your suitcase in the car.  After surgery it may be brought to your room.  For patients admitted to the hospital, discharge time will be determined by your treatment team.  Patients discharged the day of surgery will not be allowed to drive home.    Special Instructions: [place] - Preparing for Surgery  Before surgery, you can play an important role.  Because skin is not sterile, your skin needs to be as free of germs as possible.  You can reduce the number of germs on you skin by washing with CHG (chlorahexidine gluconate) soap before surgery.  CHG is an antiseptic cleaner which kills germs and bonds with the skin to continue killing germs even after washing.  Please DO NOT use if you have an allergy  to CHG or antibacterial soaps.  If your skin becomes reddened/irritated stop using the CHG and inform your nurse when you arrive at Short Stay.  Do not shave (including legs and underarms) for at least 48 hours prior to the first CHG shower.  You may shave your face.  Please follow these instructions carefully:   1.  Shower with CHG Soap the night before surgery and the   morning of Surgery.  2.  If you choose to wash your hair, wash your hair first as usual with your normal shampoo.  3.  After you shampoo, rinse your hair and body thoroughly to remove the  Shampoo.  4.  Use CHG as you would any other liquid soap.  You can apply chg directly  to the skin and wash gently with scrungie or a clean washcloth.  5.  Apply the CHG Soap to your body ONLY FROM THE NECK DOWN.   Do not use on open wounds or open sores.  Avoid contact with your eyes,  ears, mouth and genitals (private parts).  Wash genitals (private parts) with your normal soap.  6.  Wash thoroughly, paying special attention to the area where your surgery will be performed.  7.  Thoroughly rinse your body with warm water from the neck down.  8.  DO NOT shower/wash with your normal soap after using and rinsing o  the CHG Soap.  9.  Pat yourself dry with a clean towel.            10.  Wear clean  pajamas.            11.  Place clean sheets on your bed the night of your first shower and do not sleep with pets.  Day of Surgery  Do not apply any lotions/deodorants the morning of surgery.  Please wear clean clothes to the hospital/surgery center.   Please read over the following fact sheets that you were given. MRSA Information and Surgical Site Infection Prevention

## 2017-03-10 ENCOUNTER — Other Ambulatory Visit: Payer: Self-pay | Admitting: *Deleted

## 2017-03-15 ENCOUNTER — Other Ambulatory Visit: Payer: Self-pay | Admitting: *Deleted

## 2017-03-15 DIAGNOSIS — F331 Major depressive disorder, recurrent, moderate: Secondary | ICD-10-CM | POA: Diagnosis not present

## 2017-03-15 DIAGNOSIS — F411 Generalized anxiety disorder: Secondary | ICD-10-CM | POA: Diagnosis not present

## 2017-03-16 ENCOUNTER — Encounter (HOSPITAL_COMMUNITY): Payer: Self-pay | Admitting: Certified Registered"

## 2017-03-16 MED ORDER — CEFAZOLIN SODIUM 10 G IJ SOLR
3.0000 g | INTRAMUSCULAR | Status: DC
  Filled 2017-03-16: qty 3000

## 2017-03-17 ENCOUNTER — Encounter (HOSPITAL_COMMUNITY): Admission: RE | Payer: Self-pay | Source: Ambulatory Visit

## 2017-03-17 ENCOUNTER — Inpatient Hospital Stay (HOSPITAL_COMMUNITY): Admission: RE | Admit: 2017-03-17 | Payer: BLUE CROSS/BLUE SHIELD | Source: Ambulatory Visit | Admitting: Neurosurgery

## 2017-03-17 SURGERY — ANTERIOR LUMBAR FUSION 1 LEVEL
Anesthesia: General

## 2017-04-08 ENCOUNTER — Ambulatory Visit: Payer: BLUE CROSS/BLUE SHIELD | Admitting: Family Medicine

## 2017-04-08 ENCOUNTER — Encounter: Payer: Self-pay | Admitting: Family Medicine

## 2017-04-08 VITALS — BP 112/80 | Temp 98.2°F | Ht 69.0 in | Wt 264.8 lb

## 2017-04-08 DIAGNOSIS — L309 Dermatitis, unspecified: Secondary | ICD-10-CM

## 2017-04-08 DIAGNOSIS — K649 Unspecified hemorrhoids: Secondary | ICD-10-CM | POA: Diagnosis not present

## 2017-04-08 MED ORDER — CLOBETASOL PROPIONATE 0.05 % EX FOAM: Freq: Two times a day (BID) | CUTANEOUS | 0 refills | Status: DC

## 2017-04-08 NOTE — Progress Notes (Addendum)
HPI:  Acute visit for scalp irritation: -Started about 3-4 weeks ago after she started a new hair product -Scalp is been itchy and irritated -He has an appointment with a dermatologist about this but she did not wait -He does have a history of sensitive skin and scalp problems in the past -She also colored her hair right around when this started -has not had trouble with coloring products in the past  ROS: See pertinent positives and negatives per HPI.  Past Medical History:  Diagnosis Date  . Allergy   . Anxiety   . Arthritis   . B12 deficiency   . Depression    not currently   . Dysrhythmia    hx tachy-was from medication nortriptylline  . Esophagitis    rx  . Fibromyalgia   . GERD (gastroesophageal reflux disease)   . History of echocardiogram    Echo 4/18: Mild concentric LVH, vigorous LVF, EF 65-70, normal wall motion, grade 1 diastolic dysfunction  . History of kidney stones   . Hyperlipidemia   . Intervertebral disc protrusion 01/11/2014  . Migraine   . Obesity (BMI 35.0-39.9 without comorbidity)   . Peripheral neuropathy   . Polycystic ovaries   . Spinal stenosis, lumbar   . Vitamin D deficiency     Past Surgical History:  Procedure Laterality Date  . APPENDECTOMY    . BACK SURGERY  2012   left laminectomy at L3-4 per Dr. Phoebe Perch   . bladder tack  2012  . LUMBAR DISC SURGERY  01-31-14   per Dr. [name]   . OOPHORECTOMY     left overy  . OVARIAN CYST REMOVAL    . RECTOCELE REPAIR    . SINUSOTOMY  12/14/06  . spinal fusion  2016/10  . TONSILLECTOMY    . TONSILLECTOMY    . VAGINAL HYSTERECTOMY    . WRIST GANGLION EXCISION Left     Family History  Problem Relation Age of Onset  . Fibromyalgia Mother   . Diabetes Mother   . Thyroid cancer Mother   . Liver disease Mother   . Neuropathy Mother   . Cirrhosis Mother        non-alcoholic  . Pulmonary embolism Mother        both lungs  . Hypertension Father   . Diabetes Father   . Heart disease Father    . Prostate cancer Father   . Heart attack Father        x 2  . Colon cancer Neg Hx   . Other Neg Hx        pheochromocytoma  . Colon polyps Neg Hx     Social History   Socioeconomic History  . Marital status: Married    Spouse name: None  . Number of children: 2  . Years of education: None  . Highest education level: None  Social Needs  . Financial resource strain: None  . Food insecurity - worry: None  . Food insecurity - inability: None  . Transportation needs - medical: None  . Transportation needs - non-medical: None  Occupational History  . Occupation: housewife  Tobacco Use  . Smoking status: Former Smoker    Types: Cigarettes    Last attempt to quit: 1999    Years since quitting: 19.8  . Smokeless tobacco: Never Used  Substance and Sexual Activity  . Alcohol use: No    Alcohol/week: 0.0 oz  . Drug use: No  . Sexual activity: Yes    Birth  control/protection: Surgical  Other Topics Concern  . None  Social History Narrative  . None     Current Outpatient Medications:  .  ALPRAZolam (XANAX) 0.5 MG tablet, TAKE 1 TABLET BY MOUTH 3 TIMES A DAY AS NEEDED (Patient taking differently: Take 0.5 mg by mouth 3 times daily as needed for anxiety), Disp: 90 tablet, Rfl: 5 .  calcium carbonate (TUMS - DOSED IN MG ELEMENTAL CALCIUM) 500 MG chewable tablet, Chew 1 tablet by mouth 2 (two) times daily as needed for indigestion or heartburn., Disp: , Rfl:  .  cetirizine (ZYRTEC) 10 MG tablet, Take 10 mg by mouth daily., Disp: , Rfl:  .  cyanocobalamin (,VITAMIN B-12,) 1000 MCG/ML injection, INJECT 1 ML (1,000 MCG TOTAL) INTO THE MUSCLE EVERY 7 DAYS. (Patient taking differently: Inject 1,000 mcg into the muscle every Sunday. ), Disp: 12 mL, Rfl: 3 .  fluconazole (DIFLUCAN) 150 MG tablet, Take 1 tablet by mouth daily as needed (for yeast infection). , Disp: , Rfl: 5 .  fluticasone (FLONASE) 50 MCG/ACT nasal spray, Place 2 sprays into both nostrils daily. , Disp: , Rfl:  .   gabapentin (NEURONTIN) 300 MG capsule, TAKE ONE CAPSULE BY MOUTH 3 TIMES A DAY (Patient taking differently: Take 300 mg by mouth 3 times daily), Disp: 90 capsule, Rfl: 5 .  Multiple Vitamins-Minerals (MULTI-VITAMIN GUMMIES PO), Take 2 tablets by mouth daily. , Disp: , Rfl:  .  nitrofurantoin, macrocrystal-monohydrate, (MACROBID) 100 MG capsule, Take 100 mg by mouth daily as needed (for uti prophylaxis, after intercourse). , Disp: , Rfl: 2 .  oxyCODONE (ROXICODONE) 15 MG immediate release tablet, TAKE 1 TABLET BY MOUTH EVERY 4 HOURS, Disp: , Rfl: 0 .  oxyCODONE-acetaminophen (PERCOCET) 10-325 MG per tablet, Take 1 tablet by mouth every 6 (six) hours as needed for pain., Disp: 60 tablet, Rfl: 0 .  pantoprazole (PROTONIX) 40 MG tablet, Take 1 tablet (40 mg total) by mouth 2 (two) times daily., Disp: 180 tablet, Rfl: 3 .  phenazopyridine (PYRIDIUM) 200 MG tablet, Take 1 tablet (200 mg total) by mouth 3 (three) times daily as needed for pain (urethral spasm)., Disp: 10 tablet, Rfl: 1 .  phentermine 37.5 MG capsule, Take 1 capsule (37.5 mg total) by mouth every morning., Disp: 30 capsule, Rfl: 5 .  potassium chloride (KLOR-CON 10) 10 MEQ tablet, Take 1 tablet (10 mEq total) by mouth daily. (Patient taking differently: Take 10 mEq by mouth daily as needed (for potassium levels). ), Disp: 90 tablet, Rfl: 3 .  promethazine (PHENERGAN) 25 MG tablet, TAKE 1 TABLET BY MOUTH EVERY 6 HOURS AS NEEDED FOR NAUSEA (Patient taking differently: TAKE 25 MG BY MOUTH EVERY 6 HOURS AS NEEDED FOR NAUSEA), Disp: 90 tablet, Rfl: 1 .  simethicone (MYLICON) 80 MG chewable tablet, Chew 160 mg by mouth every 6 (six) hours as needed (gas pain)., Disp: , Rfl:  .  sucralfate (CARAFATE) 1 GM/10ML suspension, Take 10 mLs (1 g total) by mouth 4 (four) times daily -  with meals and at bedtime. (Patient taking differently: Take 1 g by mouth 3 (three) times daily as needed (gi issues). ), Disp: 420 mL, Rfl: 2 .  SUMAtriptan (IMITREX) 100 MG  tablet, Take 1 tablet earliest onset of migraine.  May repeat once in 2 hours if headache persists or recurs. (Patient taking differently: Take 100 mg by mouth every 2 (two) hours as needed for migraine or headache. ), Disp: 10 tablet, Rfl: 2 .  SYRINGE-NEEDLE, DISP, 3 ML (BD  INTEGRA SYRINGE) 25G X 1" 3 ML MISC, USE AS DIRECTED, Disp: 50 each, Rfl: 1 .  Vitamin D, Ergocalciferol, (DRISDOL) 50000 units CAPS capsule, Take 1 capsule (50,000 Units total) by mouth every 7 (seven) days. (Patient taking differently: Take 50,000 Units by mouth every Saturday. ), Disp: 12 capsule, Rfl: 3 .  clobetasol (OLUX) 0.05 % topical foam, Apply 2 (two) times daily topically., Disp: 50 g, Rfl: 0 .  hydrochlorothiazide (HYDRODIURIL) 25 MG tablet, Take 0.5 tablets (12.5 mg total) by mouth daily. (Patient taking differently: Take 25 mg by mouth daily. ), Disp: 90 tablet, Rfl: 3  EXAM:  Vitals:   04/08/17 1510  BP: 112/80  Temp: 98.2 F (36.8 C)    Body mass index is 39.1 kg/m.  GENERAL: vitals reviewed and listed above, alert, oriented, appears well hydrated and in no acute distress  HEENT: atraumatic, conjunttiva clear, no obvious abnormalities on inspection of external nose and ears, mild irritation and some mild flaking of the scalp and behind the ears  NECK: no obvious masses on inspection  MS: moves all extremities without noticeable abnormality  PSYCH: pleasant and cooperative, no obvious depression or anxiety  ASSESSMENT AND PLAN:  Discussed the following assessment and plan:  Dermatitis  -Looks like scalp irritation, likely a contact dermatitis versus seborrheic dermatitis -Advised that she eliminate the offending product, use Selsun Blue 1-2 times per week, olux once nightly for 1-2 weeks, follow-up with dermatologist if persists or worsens -Patient advised to return or notify a doctor immediately if symptoms worsen or persist or new concerns arise.  Patient Instructions  Try the olux once  nightly for 1-2 weeks.  Eliminate any new hair products that might be causing this problem.  Selsun Blue shampoo 1-2 times weekly.  Follow-up with your dermatologist or your doctor if symptoms persist or worsen. I hope you are feeling better soon!    Kriste BasqueKIM, Demon Volante R., DO

## 2017-04-08 NOTE — Patient Instructions (Signed)
Try the olux once nightly for 1-2 weeks.  Eliminate any new hair products that might be causing this problem.  Selsun Blue shampoo 1-2 times weekly.  Follow-up with your dermatologist or your doctor if symptoms persist or worsen. I hope you are feeling better soon!

## 2017-04-12 DIAGNOSIS — F331 Major depressive disorder, recurrent, moderate: Secondary | ICD-10-CM | POA: Diagnosis not present

## 2017-04-12 DIAGNOSIS — F411 Generalized anxiety disorder: Secondary | ICD-10-CM | POA: Diagnosis not present

## 2017-04-13 DIAGNOSIS — M47816 Spondylosis without myelopathy or radiculopathy, lumbar region: Secondary | ICD-10-CM | POA: Diagnosis not present

## 2017-04-13 DIAGNOSIS — M5440 Lumbago with sciatica, unspecified side: Secondary | ICD-10-CM | POA: Diagnosis not present

## 2017-04-20 DIAGNOSIS — F331 Major depressive disorder, recurrent, moderate: Secondary | ICD-10-CM | POA: Diagnosis not present

## 2017-04-20 DIAGNOSIS — F411 Generalized anxiety disorder: Secondary | ICD-10-CM | POA: Diagnosis not present

## 2017-04-29 DIAGNOSIS — M5126 Other intervertebral disc displacement, lumbar region: Secondary | ICD-10-CM | POA: Diagnosis not present

## 2017-05-04 DIAGNOSIS — F331 Major depressive disorder, recurrent, moderate: Secondary | ICD-10-CM | POA: Diagnosis not present

## 2017-05-04 DIAGNOSIS — F411 Generalized anxiety disorder: Secondary | ICD-10-CM | POA: Diagnosis not present

## 2017-05-11 ENCOUNTER — Other Ambulatory Visit: Payer: Self-pay | Admitting: Family Medicine

## 2017-05-11 ENCOUNTER — Other Ambulatory Visit: Payer: Self-pay | Admitting: Neurology

## 2017-05-12 ENCOUNTER — Ambulatory Visit: Payer: BLUE CROSS/BLUE SHIELD | Admitting: Licensed Clinical Social Worker

## 2017-05-19 ENCOUNTER — Ambulatory Visit: Payer: BLUE CROSS/BLUE SHIELD | Admitting: Licensed Clinical Social Worker

## 2017-05-19 DIAGNOSIS — F3341 Major depressive disorder, recurrent, in partial remission: Secondary | ICD-10-CM | POA: Diagnosis not present

## 2017-05-20 ENCOUNTER — Other Ambulatory Visit: Payer: Self-pay | Admitting: Family Medicine

## 2017-05-21 ENCOUNTER — Ambulatory Visit: Payer: BLUE CROSS/BLUE SHIELD | Admitting: Nurse Practitioner

## 2017-05-21 ENCOUNTER — Encounter: Payer: Self-pay | Admitting: Nurse Practitioner

## 2017-05-21 ENCOUNTER — Telehealth: Payer: Self-pay | Admitting: Internal Medicine

## 2017-05-21 VITALS — BP 142/98 | HR 102 | Ht 69.0 in | Wt 264.0 lb

## 2017-05-21 DIAGNOSIS — R000 Tachycardia, unspecified: Secondary | ICD-10-CM

## 2017-05-21 DIAGNOSIS — I10 Essential (primary) hypertension: Secondary | ICD-10-CM

## 2017-05-21 NOTE — Progress Notes (Signed)
Electrophysiology Office Note Date: 05/21/2017  ID:  Hannah OmanMyranda [name], DOB 04/07/1976, MRN [id number]  PCP: [name], Hannah A, MD Electrophysiologist: [name]  CC: follow up on tachycardia  [name] is [name] 41 y.o. female seen today for Dr [name].  She is seen in follow up for sinus tachycardia and hypertension.  Her BB was discontinued 2/2 fatigue and she was placed on Verapamil which caused constipation.  She was subsequently started on Diltiazem with improvement in heart rates.  Urine metanepherines were elevated and she underwent CT scanning which did not demonstrate any adrenal nodules or masses. She stopped her Nortriptyline for her 24 hour urine tests with endocrine and her symptoms resolved.  Since last being seen by Dr [name], she reports doing reasonably well.  Her faitgue is improved.  BP is elevated today in clinic but typically normal. She has no awareness of palpitations. She denies chest pain, palpitations, PND, orthopnea, nausea, vomiting, dizziness, syncope.  Past Medical History:  Diagnosis Date  . Allergy   . Anxiety   . Arthritis   . B12 deficiency   . Depression    not currently   . Dysrhythmia    hx tachy-was from medication nortriptylline  . Esophagitis    rx  . Fibromyalgia   . GERD (gastroesophageal reflux disease)   . History of echocardiogram    Echo 4/18: Mild concentric LVH, vigorous LVF, EF 65-70, normal wall motion, grade 1 diastolic dysfunction  . History of kidney stones   . Hyperlipidemia   . Intervertebral disc protrusion 01/11/2014  . Migraine   . Obesity (BMI 35.0-39.9 without comorbidity)   . Peripheral neuropathy   . Polycystic ovaries   . Spinal stenosis, lumbar   . Vitamin D deficiency    Past Surgical History:  Procedure Laterality Date  . 24 HOUR PH STUDY N/[name] 05/27/2016   Procedure: 24 HOUR PH STUDY;  Surgeon: Hannah FrederickSteven Paul Armbruster, MD;  Location: [facility];  Service: Gastroenterology;  Laterality: N/[name];  . APPENDECTOMY    . BACK  SURGERY  2012   left laminectomy at L3-4 per Dr. Phoebe [name]   . bladder tack  2012  . CHOLECYSTECTOMY N/[name] 10/03/2015   Procedure: LAPAROSCOPIC CHOLECYSTECTOMY;  Surgeon: Hannah PickleLuke Aaron Kinsinger, MD;  [facility];  Service: General;  Laterality: N/[name];  . ESOPHAGEAL MANOMETRY N/[name] 05/27/2016   Procedure: ESOPHAGEAL MANOMETRY (EM);  Surgeon: Hannah FrederickSteven Paul Armbruster, MD;  Location: [facility];  Service: Gastroenterology;  Laterality: N/[name];  . EXCISION OF SKIN TAG  10/03/2015   Procedure: EXCISION OF SKIN TAG;  Surgeon: De BlanchLuke Aaron Kinsinger, MD;  Location: [facility];  Service: General;;  . LUMBAR DISC SURGERY  01-31-14   per Dr. Wynetta [name]   . OOPHORECTOMY     left overy  . OVARIAN CYST REMOVAL    . RECTOCELE REPAIR    . SINUSOTOMY  12/14/06  . spinal fusion  2016/10  . TONSILLECTOMY    . TONSILLECTOMY    . VAGINAL HYSTERECTOMY    . WRIST GANGLION EXCISION Left     Current Outpatient Medications  Medication Sig Dispense Refill  . ALPRAZolam (XANAX) 0.5 MG tablet TAKE 1 TABLET BY MOUTH 3 TIMES [name] DAY AS NEEDED (Patient taking differently: Take 0.5 mg by mouth 3 times daily as needed for anxiety) 90 tablet 5  . calcium carbonate (TUMS - DOSED IN MG ELEMENTAL CALCIUM) 500 MG chewable tablet Chew 1 tablet by mouth 2 (two) times daily as needed for indigestion or heartburn.    .[name]  cetirizine (ZYRTEC) 10 MG tablet Take 10 mg by mouth daily.    . clobetasol (OLUX) 0.05 % topical foam Apply 2 (two) times daily topically. 50 g 0  . cyanocobalamin (,VITAMIN B-12,) 1000 MCG/ML injection INJECT 1 ML (1,000 MCG TOTAL) INTO THE MUSCLE EVERY 7 DAYS. (Patient taking differently: Inject 1,000 mcg into the muscle every Sunday. ) 12 mL 3  . fluconazole (DIFLUCAN) 150 MG tablet Take 1 tablet by mouth daily as needed (for yeast infection).   5  . fluticasone (FLONASE) 50 MCG/ACT nasal spray Place 2 sprays into both nostrils daily.     [name] gabapentin (NEURONTIN) 300 MG capsule TAKE ONE CAPSULE BY MOUTH 3 TIMES [name] DAY 90 capsule 0  .  hydrochlorothiazide (HYDRODIURIL) 25 MG tablet TAKE 1 TABLET BY MOUTH EVERY DAY 90 tablet 2  . Multiple Vitamins-Minerals (MULTI-VITAMIN GUMMIES PO) Take 2 tablets by mouth daily.     . nitrofurantoin, macrocrystal-monohydrate, (MACROBID) 100 MG capsule Take 100 mg by mouth daily as needed (for uti prophylaxis, after intercourse).   2  . oxyCODONE (ROXICODONE) 15 MG immediate release tablet TAKE 1 TABLET BY MOUTH EVERY 4 HOURS  0  . oxyCODONE-acetaminophen (PERCOCET) 10-325 MG per tablet Take 1 tablet by mouth every 6 (six) hours as needed for pain. 60 tablet 0  . pantoprazole (PROTONIX) 40 MG tablet Take 1 tablet (40 mg total) by mouth 2 (two) times daily. 180 tablet 3  . phenazopyridine (PYRIDIUM) 200 MG tablet Take 1 tablet (200 mg total) by mouth 3 (three) times daily as needed for pain (urethral spasm). 10 tablet 1  . phentermine 37.5 MG capsule Take 1 capsule (37.5 mg total) by mouth every morning. 30 capsule 5  . potassium chloride (KLOR-CON 10) 10 MEQ tablet Take 1 tablet (10 mEq total) by mouth daily. (Patient taking differently: Take 10 mEq by mouth daily as needed (for potassium levels). ) 90 tablet 3  . promethazine (PHENERGAN) 25 MG tablet TAKE 1 TABLET BY MOUTH EVERY 6 HOURS AS NEEDED FOR NAUSEA (Patient taking differently: TAKE 25 MG BY MOUTH EVERY 6 HOURS AS NEEDED FOR NAUSEA) 90 tablet 1  . simethicone (MYLICON) 80 MG chewable tablet Chew 160 mg by mouth every 6 (six) hours as needed (gas pain).    . sucralfate (CARAFATE) 1 GM/10ML suspension Take 10 mLs (1 g total) by mouth 4 (four) times daily -  with meals and at bedtime. (Patient taking differently: Take 1 g by mouth 3 (three) times daily as needed (gi issues). ) 420 mL 2  . SUMAtriptan (IMITREX) 100 MG tablet Take 1 tablet earliest onset of migraine.  May repeat once in 2 hours if headache persists or recurs. (Patient taking differently: Take 100 mg by mouth every 2 (two) hours as needed for migraine or headache. ) 10 tablet 2  .  SYRINGE-NEEDLE, DISP, 3 ML (BD INTEGRA SYRINGE) 25G X 1" 3 ML MISC USE AS DIRECTED 50 each 1  . Vitamin D, Ergocalciferol, (DRISDOL) 50000 units CAPS capsule Take 1 capsule (50,000 Units total) by mouth every 7 (seven) days. (Patient taking differently: Take 50,000 Units by mouth every Saturday. ) 12 capsule 3   No current facility-administered medications for this visit.     Allergies:   Gadolinium derivatives; Zoloft [sertraline hcl]; and Other   Social History: Social History   Socioeconomic History  . Marital status: Married    Spouse name: Not on file  . Number of children: 2  . Years of education: Not on  file  . Highest education level: Not on file  Social Needs  . Financial resource strain: Not on file  . Food insecurity - worry: Not on file  . Food insecurity - inability: Not on file  . Transportation needs - medical: Not on file  . Transportation needs - non-medical: Not on file  Occupational History  . Occupation: housewife  Tobacco Use  . Smoking status: Former Smoker    Types: Cigarettes    Last attempt to quit: 1999    Years since quitting: 19.9  . Smokeless tobacco: Never Used  Substance and Sexual Activity  . Alcohol use: No    Alcohol/week: 0.0 oz  . Drug use: No  . Sexual activity: Yes    Birth control/protection: Surgical  Other Topics Concern  . Not on file  Social History Narrative  . Not on file    Family History: Family History  Problem Relation Age of Onset  . Fibromyalgia Mother   . Diabetes Mother   . Thyroid cancer Mother   . Liver disease Mother   . Neuropathy Mother   . Cirrhosis Mother        non-alcoholic  . Pulmonary embolism Mother        both lungs  . Hypertension Father   . Diabetes Father   . Heart disease Father   . Prostate cancer Father   . Heart attack Father        x 2  . Colon cancer Neg Hx   . Other Neg Hx        pheochromocytoma  . Colon polyps Neg Hx     Review of Systems: All other systems reviewed and  are otherwise negative except as noted above.   Physical Exam: VS:  LMP 12/13/1999 Comment: single oophorectomy//[name].c. , BMI There is no height or weight on file to calculate BMI. Wt Readings from Last 3 Encounters:  04/08/17 264 lb 12.8 oz (120.1 kg)  03/09/17 262 lb (118.8 kg)  03/02/17 259 lb (117.5 kg)    GEN- The patient is obese appearing, alert and oriented x 3 today.   HEENT: normocephalic, atraumatic; sclera clear, conjunctiva pink; hearing intact; oropharynx clear; neck supple  Lymph- no cervical lymphadenopathy Lungs- Clear to ausculation bilaterally, normal work of breathing.  No wheezes, rales, rhonchi Heart- Regular rate and rhythm  GI- obese, non-tender, non-distended, bowel sounds present  Extremities- no clubbing, cyanosis, no edema; DP/PT/radial pulses 1+ bilaterally MS- no significant deformity or atrophy Skin- warm and dry, no rash or lesion  Psych- euthymic mood, full affect Neuro- strength and sensation are intact   EKG:  EKG is not ordered today.  Recent Labs: 08/15/2016: ALT 15 09/03/2016: TSH 2.022 03/09/2017: BUN 11; Creatinine, Ser 1.13; Hemoglobin 14.9; Platelets 332; Potassium 3.7; Sodium 139    Other studies Reviewed: Additional studies/ records that were reviewed today include: Dr Odessa Fleming office notes  Assessment and Plan: 1.  Sinus tachycardia Stable No change required today  2.  Morbid obesity There is no height or weight on file to calculate BMI. Weight loss encouraged   3.  HTN Stable No change required today   Current medicines are reviewed at length with the patient today.   The patient does not have concerns regarding her medicines.  The following changes were made today:  none  Labs/ tests ordered today include: none   Disposition:   Follow up with Dr Hannah Husbands in 1 year, sooner if needed   Signed, Gypsy Balsam, NP 05/21/2017 6:57  AM   [facility] [address] [place] [address] [phone]  (office) [phone] (fax)

## 2017-05-21 NOTE — Patient Instructions (Addendum)
Medication Instructions:     If you need a refill on your cardiac medications before your next appointment, please call your pharmacy.  Labwork: NONE ORDERED  TODAY     Testing/Procedures: NONE ORDERED  TODAY '  Follow-Up: Your physician wants you to follow-up in: ONE YEAR WITH Graciela HusbandsKLEIN  You will receive a reminder letter in the mail two months in advance. If you don't receive a letter, please call our office to schedule the follow-up appointment.     Any Other Special Instructions Will Be Listed Below (If Applicable).

## 2017-05-21 NOTE — Telephone Encounter (Signed)
Call routed to seiler for feedback

## 2017-05-21 NOTE — Telephone Encounter (Signed)
Pt called today forgot to ask about REYNAUDA syndrome her Neurologist wanted her to ask about treating this.    Pt would be more than willing to come in again since she forgot to ask about this at today's appt.   Or is a call back would take care of that question she would appreciate it.

## 2017-05-22 NOTE — Telephone Encounter (Signed)
Sherri,  Please call patient and see what specific question is. I can try to call back while in hospital Monday potentially  Thanks. Hospital doctorAmber

## 2017-05-24 NOTE — Telephone Encounter (Signed)
lmtcb

## 2017-05-27 ENCOUNTER — Other Ambulatory Visit: Payer: Self-pay | Admitting: Family Medicine

## 2017-05-28 NOTE — Telephone Encounter (Signed)
Sent to PCP does pt need to come to the office for lab work in order to get more refills?

## 2017-05-28 NOTE — Telephone Encounter (Signed)
Pt was last tested for B 12 and Vit D on 03/24/2016.

## 2017-05-28 NOTE — Telephone Encounter (Signed)
Last OV 08/04/2016. Vit D was last refilled 03/26/2016 disp 12 cap with 3 refills. Cyanocobalamin last refilled 11/29/2015 disp 12 ml with 3 refills.

## 2017-06-04 NOTE — Telephone Encounter (Signed)
lmtcb

## 2017-06-07 DIAGNOSIS — F411 Generalized anxiety disorder: Secondary | ICD-10-CM | POA: Diagnosis not present

## 2017-06-07 DIAGNOSIS — F331 Major depressive disorder, recurrent, moderate: Secondary | ICD-10-CM | POA: Diagnosis not present

## 2017-06-08 ENCOUNTER — Ambulatory Visit: Payer: Self-pay | Admitting: Family Medicine

## 2017-06-09 ENCOUNTER — Ambulatory Visit (INDEPENDENT_AMBULATORY_CARE_PROVIDER_SITE_OTHER): Payer: BLUE CROSS/BLUE SHIELD | Admitting: Licensed Clinical Social Worker

## 2017-06-09 DIAGNOSIS — F3341 Major depressive disorder, recurrent, in partial remission: Secondary | ICD-10-CM

## 2017-06-14 DIAGNOSIS — F411 Generalized anxiety disorder: Secondary | ICD-10-CM | POA: Diagnosis not present

## 2017-06-14 DIAGNOSIS — F331 Major depressive disorder, recurrent, moderate: Secondary | ICD-10-CM | POA: Diagnosis not present

## 2017-06-23 ENCOUNTER — Ambulatory Visit (INDEPENDENT_AMBULATORY_CARE_PROVIDER_SITE_OTHER): Payer: BLUE CROSS/BLUE SHIELD | Admitting: Licensed Clinical Social Worker

## 2017-06-23 DIAGNOSIS — F3341 Major depressive disorder, recurrent, in partial remission: Secondary | ICD-10-CM | POA: Diagnosis not present

## 2017-07-04 ENCOUNTER — Other Ambulatory Visit: Payer: Self-pay | Admitting: Family Medicine

## 2017-07-07 ENCOUNTER — Ambulatory Visit (INDEPENDENT_AMBULATORY_CARE_PROVIDER_SITE_OTHER): Payer: BLUE CROSS/BLUE SHIELD | Admitting: Licensed Clinical Social Worker

## 2017-07-07 DIAGNOSIS — F3341 Major depressive disorder, recurrent, in partial remission: Secondary | ICD-10-CM

## 2017-07-13 DIAGNOSIS — F331 Major depressive disorder, recurrent, moderate: Secondary | ICD-10-CM | POA: Diagnosis not present

## 2017-07-13 DIAGNOSIS — F411 Generalized anxiety disorder: Secondary | ICD-10-CM | POA: Diagnosis not present

## 2017-07-20 DIAGNOSIS — F331 Major depressive disorder, recurrent, moderate: Secondary | ICD-10-CM | POA: Diagnosis not present

## 2017-07-20 DIAGNOSIS — F411 Generalized anxiety disorder: Secondary | ICD-10-CM | POA: Diagnosis not present

## 2017-07-21 ENCOUNTER — Ambulatory Visit: Payer: BLUE CROSS/BLUE SHIELD | Admitting: Licensed Clinical Social Worker

## 2017-07-27 DIAGNOSIS — F331 Major depressive disorder, recurrent, moderate: Secondary | ICD-10-CM | POA: Diagnosis not present

## 2017-07-27 DIAGNOSIS — F411 Generalized anxiety disorder: Secondary | ICD-10-CM | POA: Diagnosis not present

## 2017-07-28 ENCOUNTER — Telehealth: Payer: BLUE CROSS/BLUE SHIELD | Admitting: Family

## 2017-07-28 DIAGNOSIS — J019 Acute sinusitis, unspecified: Secondary | ICD-10-CM

## 2017-07-28 MED ORDER — LEVOFLOXACIN 500 MG PO TABS: 500.0000 mg | ORAL_TABLET | Freq: Every day | ORAL | 0 refills | Status: DC

## 2017-07-28 NOTE — Progress Notes (Signed)

## 2017-08-01 ENCOUNTER — Other Ambulatory Visit: Payer: Self-pay | Admitting: Family Medicine

## 2017-08-02 DIAGNOSIS — F331 Major depressive disorder, recurrent, moderate: Secondary | ICD-10-CM | POA: Diagnosis not present

## 2017-08-02 DIAGNOSIS — F411 Generalized anxiety disorder: Secondary | ICD-10-CM | POA: Diagnosis not present

## 2017-08-04 ENCOUNTER — Other Ambulatory Visit: Payer: Self-pay | Admitting: Adult Health

## 2017-08-04 ENCOUNTER — Ambulatory Visit (INDEPENDENT_AMBULATORY_CARE_PROVIDER_SITE_OTHER): Payer: BLUE CROSS/BLUE SHIELD | Admitting: Licensed Clinical Social Worker

## 2017-08-04 DIAGNOSIS — F3341 Major depressive disorder, recurrent, in partial remission: Secondary | ICD-10-CM | POA: Diagnosis not present

## 2017-08-10 DIAGNOSIS — F331 Major depressive disorder, recurrent, moderate: Secondary | ICD-10-CM | POA: Diagnosis not present

## 2017-08-18 ENCOUNTER — Ambulatory Visit: Payer: BLUE CROSS/BLUE SHIELD | Admitting: Licensed Clinical Social Worker

## 2017-08-23 DIAGNOSIS — F331 Major depressive disorder, recurrent, moderate: Secondary | ICD-10-CM | POA: Diagnosis not present

## 2017-08-23 DIAGNOSIS — F411 Generalized anxiety disorder: Secondary | ICD-10-CM | POA: Diagnosis not present

## 2017-08-29 ENCOUNTER — Telehealth: Payer: BLUE CROSS/BLUE SHIELD | Admitting: Family

## 2017-08-29 DIAGNOSIS — J0191 Acute recurrent sinusitis, unspecified: Secondary | ICD-10-CM

## 2017-08-29 MED ORDER — AMOXICILLIN-POT CLAVULANATE 875-125 MG PO TABS: 1.0000 | ORAL_TABLET | Freq: Two times a day (BID) | ORAL | 0 refills | Status: DC

## 2017-08-29 NOTE — Progress Notes (Signed)

## 2017-08-31 DIAGNOSIS — F411 Generalized anxiety disorder: Secondary | ICD-10-CM | POA: Diagnosis not present

## 2017-08-31 DIAGNOSIS — F331 Major depressive disorder, recurrent, moderate: Secondary | ICD-10-CM | POA: Diagnosis not present

## 2017-09-01 ENCOUNTER — Ambulatory Visit (INDEPENDENT_AMBULATORY_CARE_PROVIDER_SITE_OTHER): Payer: BLUE CROSS/BLUE SHIELD | Admitting: Licensed Clinical Social Worker

## 2017-09-01 DIAGNOSIS — F3341 Major depressive disorder, recurrent, in partial remission: Secondary | ICD-10-CM | POA: Diagnosis not present

## 2017-09-14 DIAGNOSIS — F411 Generalized anxiety disorder: Secondary | ICD-10-CM | POA: Diagnosis not present

## 2017-09-14 DIAGNOSIS — F331 Major depressive disorder, recurrent, moderate: Secondary | ICD-10-CM | POA: Diagnosis not present

## 2017-09-15 ENCOUNTER — Ambulatory Visit (INDEPENDENT_AMBULATORY_CARE_PROVIDER_SITE_OTHER): Payer: BLUE CROSS/BLUE SHIELD | Admitting: Licensed Clinical Social Worker

## 2017-09-15 DIAGNOSIS — F3341 Major depressive disorder, recurrent, in partial remission: Secondary | ICD-10-CM | POA: Diagnosis not present

## 2017-09-20 DIAGNOSIS — F411 Generalized anxiety disorder: Secondary | ICD-10-CM | POA: Diagnosis not present

## 2017-09-20 DIAGNOSIS — F331 Major depressive disorder, recurrent, moderate: Secondary | ICD-10-CM | POA: Diagnosis not present

## 2017-09-20 DIAGNOSIS — G43909 Migraine, unspecified, not intractable, without status migrainosus: Secondary | ICD-10-CM | POA: Diagnosis not present

## 2017-09-20 DIAGNOSIS — M797 Fibromyalgia: Secondary | ICD-10-CM | POA: Diagnosis not present

## 2017-09-20 DIAGNOSIS — M545 Low back pain: Secondary | ICD-10-CM | POA: Diagnosis not present

## 2017-09-29 ENCOUNTER — Ambulatory Visit (INDEPENDENT_AMBULATORY_CARE_PROVIDER_SITE_OTHER): Payer: BLUE CROSS/BLUE SHIELD | Admitting: Licensed Clinical Social Worker

## 2017-09-29 DIAGNOSIS — F3341 Major depressive disorder, recurrent, in partial remission: Secondary | ICD-10-CM | POA: Diagnosis not present

## 2017-10-05 DIAGNOSIS — F411 Generalized anxiety disorder: Secondary | ICD-10-CM | POA: Diagnosis not present

## 2017-10-05 DIAGNOSIS — F331 Major depressive disorder, recurrent, moderate: Secondary | ICD-10-CM | POA: Diagnosis not present

## 2017-10-13 ENCOUNTER — Ambulatory Visit (INDEPENDENT_AMBULATORY_CARE_PROVIDER_SITE_OTHER): Payer: BLUE CROSS/BLUE SHIELD | Admitting: Licensed Clinical Social Worker

## 2017-10-13 DIAGNOSIS — F3341 Major depressive disorder, recurrent, in partial remission: Secondary | ICD-10-CM

## 2017-10-14 ENCOUNTER — Ambulatory Visit: Payer: BLUE CROSS/BLUE SHIELD | Admitting: Family Medicine

## 2017-10-14 ENCOUNTER — Encounter: Payer: Self-pay | Admitting: Family Medicine

## 2017-10-14 VITALS — BP 136/96 | HR 84 | Temp 97.8°F | Wt 256.0 lb

## 2017-10-14 DIAGNOSIS — J3089 Other allergic rhinitis: Secondary | ICD-10-CM

## 2017-10-14 DIAGNOSIS — J0191 Acute recurrent sinusitis, unspecified: Secondary | ICD-10-CM

## 2017-10-14 MED ORDER — METHYLPREDNISOLONE ACETATE 80 MG/ML IJ SUSP
120.0000 mg | Freq: Once | INTRAMUSCULAR | Status: AC
  Administered 2017-10-14: 120 mg via INTRAMUSCULAR

## 2017-10-14 MED ORDER — PHENTERMINE HCL 37.5 MG PO CAPS: 37.5000 mg | ORAL_CAPSULE | ORAL | 5 refills | Status: DC

## 2017-10-14 MED ORDER — CEFUROXIME AXETIL 500 MG PO TABS: 500.0000 mg | ORAL_TABLET | Freq: Two times a day (BID) | ORAL | 0 refills | Status: DC

## 2017-10-14 NOTE — Progress Notes (Signed)
   Subjective:    Patient ID: [name], female    DOB: 11-20-1975, 42 y.o.   MRN: [id number]  HPI Here for 8 weeks of sinus pressure, PND, headaches, and ST. No fever or cough. She had an e visit on 07-28-17 and was given Levaquin, with no response. She then had another e visit on 08-29-17 and was given Augmentin, again with no response. She is taking Zyrtec, Flonase, and Mucinex at home.    Review of Systems  Constitutional: Negative.   HENT: Positive for congestion, postnasal drip, sinus pressure and sinus pain. Negative for sore throat.   Eyes: Negative.   Respiratory: Negative.        Objective:   Physical Exam  Constitutional: She appears well-developed and well-nourished.  HENT:  Right Ear: External ear normal.  Left Ear: External ear normal.  Nose: Nose normal.  Mouth/Throat: Oropharynx is clear and moist.  Eyes: Conjunctivae are normal.  Neck: No thyromegaly present.  Pulmonary/Chest: Breath sounds normal. No stridor. No respiratory distress. She has no wheezes. She has no rales.  Lymphadenopathy:    She has no cervical adenopathy.          Assessment & Plan:  Sinusitis, treat with 14 days of Ceftin. Given a steroid shot.  Refer to Allergy for testing.  Gershon Crane, MD

## 2017-10-14 NOTE — Addendum Note (Signed)
Addended by: Carola Rhine on: 10/14/2017 12:22 PM   Modules accepted: Orders

## 2017-10-18 DIAGNOSIS — F331 Major depressive disorder, recurrent, moderate: Secondary | ICD-10-CM | POA: Diagnosis not present

## 2017-10-18 DIAGNOSIS — F411 Generalized anxiety disorder: Secondary | ICD-10-CM | POA: Diagnosis not present

## 2017-10-27 ENCOUNTER — Ambulatory Visit (INDEPENDENT_AMBULATORY_CARE_PROVIDER_SITE_OTHER): Payer: BLUE CROSS/BLUE SHIELD | Admitting: Licensed Clinical Social Worker

## 2017-10-27 DIAGNOSIS — F3341 Major depressive disorder, recurrent, in partial remission: Secondary | ICD-10-CM | POA: Diagnosis not present

## 2017-10-27 DIAGNOSIS — F332 Major depressive disorder, recurrent severe without psychotic features: Secondary | ICD-10-CM | POA: Diagnosis not present

## 2017-11-03 ENCOUNTER — Other Ambulatory Visit: Payer: Self-pay | Admitting: Family Medicine

## 2017-11-03 DIAGNOSIS — F332 Major depressive disorder, recurrent severe without psychotic features: Secondary | ICD-10-CM | POA: Diagnosis not present

## 2017-11-03 NOTE — Telephone Encounter (Signed)
Call in Fluconazole #5 with 5 rf, also Xanax #90 with 5 rf

## 2017-11-03 NOTE — Telephone Encounter (Signed)
Last OV 10/14/2017   Xanax last refilled 12/25/2016  Disp 90 with  5 refills   fluconazole is not listed on pt's medication list  Macrobid was last refilled 09/26/2016   Sent to PCP to advise

## 2017-11-05 DIAGNOSIS — F332 Major depressive disorder, recurrent severe without psychotic features: Secondary | ICD-10-CM | POA: Diagnosis not present

## 2017-11-08 DIAGNOSIS — F332 Major depressive disorder, recurrent severe without psychotic features: Secondary | ICD-10-CM | POA: Diagnosis not present

## 2017-11-09 DIAGNOSIS — F332 Major depressive disorder, recurrent severe without psychotic features: Secondary | ICD-10-CM | POA: Diagnosis not present

## 2017-11-10 ENCOUNTER — Ambulatory Visit: Payer: Self-pay | Admitting: Licensed Clinical Social Worker

## 2017-11-10 DIAGNOSIS — F332 Major depressive disorder, recurrent severe without psychotic features: Secondary | ICD-10-CM | POA: Diagnosis not present

## 2017-11-11 DIAGNOSIS — F332 Major depressive disorder, recurrent severe without psychotic features: Secondary | ICD-10-CM | POA: Diagnosis not present

## 2017-11-12 DIAGNOSIS — F332 Major depressive disorder, recurrent severe without psychotic features: Secondary | ICD-10-CM | POA: Diagnosis not present

## 2017-11-15 ENCOUNTER — Ambulatory Visit (INDEPENDENT_AMBULATORY_CARE_PROVIDER_SITE_OTHER): Payer: BLUE CROSS/BLUE SHIELD | Admitting: Licensed Clinical Social Worker

## 2017-11-15 DIAGNOSIS — F3341 Major depressive disorder, recurrent, in partial remission: Secondary | ICD-10-CM

## 2017-11-15 DIAGNOSIS — F332 Major depressive disorder, recurrent severe without psychotic features: Secondary | ICD-10-CM | POA: Diagnosis not present

## 2017-11-16 DIAGNOSIS — L218 Other seborrheic dermatitis: Secondary | ICD-10-CM | POA: Diagnosis not present

## 2017-11-16 DIAGNOSIS — F332 Major depressive disorder, recurrent severe without psychotic features: Secondary | ICD-10-CM | POA: Diagnosis not present

## 2017-11-16 DIAGNOSIS — D1801 Hemangioma of skin and subcutaneous tissue: Secondary | ICD-10-CM | POA: Diagnosis not present

## 2017-11-16 DIAGNOSIS — L821 Other seborrheic keratosis: Secondary | ICD-10-CM | POA: Diagnosis not present

## 2017-11-16 DIAGNOSIS — D225 Melanocytic nevi of trunk: Secondary | ICD-10-CM | POA: Diagnosis not present

## 2017-11-17 DIAGNOSIS — F332 Major depressive disorder, recurrent severe without psychotic features: Secondary | ICD-10-CM | POA: Diagnosis not present

## 2017-11-18 DIAGNOSIS — F332 Major depressive disorder, recurrent severe without psychotic features: Secondary | ICD-10-CM | POA: Diagnosis not present

## 2017-11-19 DIAGNOSIS — F332 Major depressive disorder, recurrent severe without psychotic features: Secondary | ICD-10-CM | POA: Diagnosis not present

## 2017-11-22 DIAGNOSIS — F332 Major depressive disorder, recurrent severe without psychotic features: Secondary | ICD-10-CM | POA: Diagnosis not present

## 2017-11-23 DIAGNOSIS — F332 Major depressive disorder, recurrent severe without psychotic features: Secondary | ICD-10-CM | POA: Diagnosis not present

## 2017-11-24 ENCOUNTER — Ambulatory Visit (INDEPENDENT_AMBULATORY_CARE_PROVIDER_SITE_OTHER): Payer: BLUE CROSS/BLUE SHIELD | Admitting: Licensed Clinical Social Worker

## 2017-11-24 DIAGNOSIS — F3341 Major depressive disorder, recurrent, in partial remission: Secondary | ICD-10-CM | POA: Diagnosis not present

## 2017-11-24 DIAGNOSIS — F332 Major depressive disorder, recurrent severe without psychotic features: Secondary | ICD-10-CM | POA: Diagnosis not present

## 2017-11-25 DIAGNOSIS — F332 Major depressive disorder, recurrent severe without psychotic features: Secondary | ICD-10-CM | POA: Diagnosis not present

## 2017-11-26 DIAGNOSIS — F332 Major depressive disorder, recurrent severe without psychotic features: Secondary | ICD-10-CM | POA: Diagnosis not present

## 2017-11-29 DIAGNOSIS — F332 Major depressive disorder, recurrent severe without psychotic features: Secondary | ICD-10-CM | POA: Diagnosis not present

## 2017-11-30 DIAGNOSIS — M797 Fibromyalgia: Secondary | ICD-10-CM | POA: Diagnosis not present

## 2017-11-30 DIAGNOSIS — F331 Major depressive disorder, recurrent, moderate: Secondary | ICD-10-CM | POA: Diagnosis not present

## 2017-11-30 DIAGNOSIS — G43909 Migraine, unspecified, not intractable, without status migrainosus: Secondary | ICD-10-CM | POA: Diagnosis not present

## 2017-11-30 DIAGNOSIS — F411 Generalized anxiety disorder: Secondary | ICD-10-CM | POA: Diagnosis not present

## 2017-11-30 DIAGNOSIS — M545 Low back pain: Secondary | ICD-10-CM | POA: Diagnosis not present

## 2017-12-01 DIAGNOSIS — F332 Major depressive disorder, recurrent severe without psychotic features: Secondary | ICD-10-CM | POA: Diagnosis not present

## 2017-12-03 DIAGNOSIS — F332 Major depressive disorder, recurrent severe without psychotic features: Secondary | ICD-10-CM | POA: Diagnosis not present

## 2017-12-06 DIAGNOSIS — F332 Major depressive disorder, recurrent severe without psychotic features: Secondary | ICD-10-CM | POA: Diagnosis not present

## 2017-12-07 DIAGNOSIS — F411 Generalized anxiety disorder: Secondary | ICD-10-CM | POA: Diagnosis not present

## 2017-12-07 DIAGNOSIS — F332 Major depressive disorder, recurrent severe without psychotic features: Secondary | ICD-10-CM | POA: Diagnosis not present

## 2017-12-07 DIAGNOSIS — M545 Low back pain: Secondary | ICD-10-CM | POA: Diagnosis not present

## 2017-12-07 DIAGNOSIS — F331 Major depressive disorder, recurrent, moderate: Secondary | ICD-10-CM | POA: Diagnosis not present

## 2017-12-07 DIAGNOSIS — M797 Fibromyalgia: Secondary | ICD-10-CM | POA: Diagnosis not present

## 2017-12-07 DIAGNOSIS — G43909 Migraine, unspecified, not intractable, without status migrainosus: Secondary | ICD-10-CM | POA: Diagnosis not present

## 2017-12-08 ENCOUNTER — Ambulatory Visit: Payer: BLUE CROSS/BLUE SHIELD | Admitting: Licensed Clinical Social Worker

## 2017-12-08 DIAGNOSIS — F332 Major depressive disorder, recurrent severe without psychotic features: Secondary | ICD-10-CM | POA: Diagnosis not present

## 2017-12-09 ENCOUNTER — Telehealth: Payer: Self-pay | Admitting: Family Medicine

## 2017-12-09 DIAGNOSIS — F332 Major depressive disorder, recurrent severe without psychotic features: Secondary | ICD-10-CM | POA: Diagnosis not present

## 2017-12-09 NOTE — Telephone Encounter (Signed)
Patient is requesting a call back to see if patient's husband can be a new patient to Dr Clent RidgesFry.  Patient's husband sees Dr Kirtland BouchardK.

## 2017-12-10 DIAGNOSIS — F332 Major depressive disorder, recurrent severe without psychotic features: Secondary | ICD-10-CM | POA: Diagnosis not present

## 2017-12-10 NOTE — Telephone Encounter (Signed)
Yes I can see him  ?

## 2017-12-10 NOTE — Telephone Encounter (Signed)
Sent to PCP to advise 

## 2017-12-10 NOTE — Telephone Encounter (Signed)
Called and left pt a detailed VM that Dr. Clent RidgesFry is will to accept her husband as a patient.

## 2017-12-13 DIAGNOSIS — F332 Major depressive disorder, recurrent severe without psychotic features: Secondary | ICD-10-CM | POA: Diagnosis not present

## 2017-12-14 DIAGNOSIS — F332 Major depressive disorder, recurrent severe without psychotic features: Secondary | ICD-10-CM | POA: Diagnosis not present

## 2017-12-15 DIAGNOSIS — F332 Major depressive disorder, recurrent severe without psychotic features: Secondary | ICD-10-CM | POA: Diagnosis not present

## 2017-12-16 DIAGNOSIS — F332 Major depressive disorder, recurrent severe without psychotic features: Secondary | ICD-10-CM | POA: Diagnosis not present

## 2017-12-17 ENCOUNTER — Other Ambulatory Visit: Payer: Self-pay | Admitting: Gastroenterology

## 2017-12-17 DIAGNOSIS — F332 Major depressive disorder, recurrent severe without psychotic features: Secondary | ICD-10-CM | POA: Diagnosis not present

## 2017-12-20 DIAGNOSIS — F332 Major depressive disorder, recurrent severe without psychotic features: Secondary | ICD-10-CM | POA: Diagnosis not present

## 2017-12-22 ENCOUNTER — Ambulatory Visit: Payer: Self-pay | Admitting: Licensed Clinical Social Worker

## 2017-12-22 DIAGNOSIS — F332 Major depressive disorder, recurrent severe without psychotic features: Secondary | ICD-10-CM | POA: Diagnosis not present

## 2017-12-23 ENCOUNTER — Encounter: Payer: Self-pay | Admitting: Family Medicine

## 2017-12-24 ENCOUNTER — Ambulatory Visit: Payer: BLUE CROSS/BLUE SHIELD | Admitting: Family Medicine

## 2017-12-24 ENCOUNTER — Encounter: Payer: Self-pay | Admitting: Family Medicine

## 2017-12-24 VITALS — BP 112/80 | HR 119 | Temp 98.5°F | Ht 69.0 in | Wt 256.2 lb

## 2017-12-24 DIAGNOSIS — F332 Major depressive disorder, recurrent severe without psychotic features: Secondary | ICD-10-CM | POA: Diagnosis not present

## 2017-12-24 DIAGNOSIS — M549 Dorsalgia, unspecified: Secondary | ICD-10-CM | POA: Diagnosis not present

## 2017-12-24 DIAGNOSIS — J0101 Acute recurrent maxillary sinusitis: Secondary | ICD-10-CM | POA: Diagnosis not present

## 2017-12-24 LAB — POCT URINALYSIS DIPSTICK
Bilirubin, UA: NEGATIVE
Blood, UA: NEGATIVE
Glucose, UA: NEGATIVE
Ketones, UA: NEGATIVE
Leukocytes, UA: NEGATIVE
Nitrite, UA: NEGATIVE
Protein, UA: NEGATIVE
Spec Grav, UA: 1.025 (ref 1.010–1.025)
Urobilinogen, UA: 0.2 E.U./dL
pH, UA: 6 (ref 5.0–8.0)

## 2017-12-24 MED ORDER — CLARITHROMYCIN 500 MG PO TABS: 500.0000 mg | ORAL_TABLET | Freq: Two times a day (BID) | ORAL | 0 refills | Status: DC

## 2017-12-24 MED ORDER — METHYLPREDNISOLONE ACETATE 80 MG/ML IJ SUSP
120.0000 mg | Freq: Once | INTRAMUSCULAR | Status: AC
  Administered 2017-12-24: 120 mg via INTRAMUSCULAR

## 2017-12-24 NOTE — Addendum Note (Signed)
Addended by: Gracelyn NurseBLACKWELL, Audric Venn P on: 12/24/2017 01:17 PM   Modules accepted: Orders

## 2017-12-24 NOTE — Progress Notes (Signed)
   Subjective:    Patient ID: Osborne OmanMyranda Shehata, female    DOB: 02/28/1976, 42 y.o.   MRN: [id number]  HPI Here for 2 weeks of pain in the left cheek, sinus pressure, and PND. No fever.    Review of Systems  Constitutional: Negative.   HENT: Positive for congestion, postnasal drip, sinus pressure and sinus pain. Negative for sore throat.   Eyes: Negative.   Respiratory: Negative.        Objective:   Physical Exam  Constitutional: She appears well-developed and well-nourished.  HENT:  Right Ear: External ear normal.  Left Ear: External ear normal.  Nose: Nose normal.  Mouth/Throat: Oropharynx is clear and moist.  Tender to percussion over the left cheek   Eyes: Conjunctivae are normal.  Neck: No thyromegaly present.  Pulmonary/Chest: Effort normal and breath sounds normal.  Lymphadenopathy:    She has no cervical adenopathy.          Assessment & Plan:  Sinusitis, treat with Biaxin and a steroid shot.  Gershon CraneStephen Fry, MD

## 2017-12-28 DIAGNOSIS — F332 Major depressive disorder, recurrent severe without psychotic features: Secondary | ICD-10-CM | POA: Diagnosis not present

## 2017-12-30 DIAGNOSIS — F332 Major depressive disorder, recurrent severe without psychotic features: Secondary | ICD-10-CM | POA: Diagnosis not present

## 2018-01-03 DIAGNOSIS — F332 Major depressive disorder, recurrent severe without psychotic features: Secondary | ICD-10-CM | POA: Diagnosis not present

## 2018-01-05 ENCOUNTER — Ambulatory Visit (INDEPENDENT_AMBULATORY_CARE_PROVIDER_SITE_OTHER): Payer: BLUE CROSS/BLUE SHIELD | Admitting: Licensed Clinical Social Worker

## 2018-01-05 DIAGNOSIS — F3341 Major depressive disorder, recurrent, in partial remission: Secondary | ICD-10-CM

## 2018-01-14 DIAGNOSIS — G039 Meningitis, unspecified: Secondary | ICD-10-CM | POA: Diagnosis not present

## 2018-01-14 DIAGNOSIS — I10 Essential (primary) hypertension: Secondary | ICD-10-CM | POA: Diagnosis not present

## 2018-01-14 DIAGNOSIS — M4807 Spinal stenosis, lumbosacral region: Secondary | ICD-10-CM | POA: Diagnosis not present

## 2018-01-14 DIAGNOSIS — M5416 Radiculopathy, lumbar region: Secondary | ICD-10-CM | POA: Diagnosis not present

## 2018-01-19 ENCOUNTER — Ambulatory Visit: Payer: BLUE CROSS/BLUE SHIELD | Admitting: Licensed Clinical Social Worker

## 2018-01-28 DIAGNOSIS — M25521 Pain in right elbow: Secondary | ICD-10-CM | POA: Diagnosis not present

## 2018-01-29 ENCOUNTER — Other Ambulatory Visit: Payer: Self-pay | Admitting: Family Medicine

## 2018-02-02 ENCOUNTER — Ambulatory Visit (INDEPENDENT_AMBULATORY_CARE_PROVIDER_SITE_OTHER): Payer: BLUE CROSS/BLUE SHIELD | Admitting: Licensed Clinical Social Worker

## 2018-02-02 DIAGNOSIS — F3341 Major depressive disorder, recurrent, in partial remission: Secondary | ICD-10-CM

## 2018-02-02 DIAGNOSIS — F332 Major depressive disorder, recurrent severe without psychotic features: Secondary | ICD-10-CM | POA: Diagnosis not present

## 2018-02-11 ENCOUNTER — Ambulatory Visit: Payer: BLUE CROSS/BLUE SHIELD | Admitting: Family Medicine

## 2018-02-11 ENCOUNTER — Encounter: Payer: Self-pay | Admitting: Family Medicine

## 2018-02-11 VITALS — BP 112/80 | HR 125 | Temp 98.8°F | Ht 69.0 in | Wt 263.4 lb

## 2018-02-11 DIAGNOSIS — J0191 Acute recurrent sinusitis, unspecified: Secondary | ICD-10-CM

## 2018-02-11 MED ORDER — METHYLPREDNISOLONE ACETATE 80 MG/ML IJ SUSP
120.0000 mg | Freq: Once | INTRAMUSCULAR | Status: AC
  Administered 2018-02-11: 120 mg via INTRAMUSCULAR

## 2018-02-11 MED ORDER — LEVOFLOXACIN 500 MG PO TABS: 500.0000 mg | ORAL_TABLET | Freq: Every day | ORAL | 0 refills | Status: AC

## 2018-02-11 NOTE — Progress Notes (Signed)
   Subjective:    Patient ID: [name], female    DOB: 09/21/1975, 42 y.o.   MRN: [id number]  HPI Here for recurrent sinus pressure and PND. No fever or cough. On Mucinex.    Review of Systems  Constitutional: Negative.   HENT: Positive for congestion, postnasal drip, sinus pressure and sinus pain. Negative for sore throat.   Respiratory: Negative for cough.        Objective:   Physical Exam  Constitutional: She appears well-developed and well-nourished.  HENT:  Right Ear: External ear normal.  Left Ear: External ear normal.  Nose: Nose normal.  Mouth/Throat: Oropharynx is clear and moist.  Eyes: Conjunctivae are normal.  Neck: No thyromegaly present.  Pulmonary/Chest: Effort normal and breath sounds normal.  Lymphadenopathy:    She has no cervical adenopathy.          Assessment & Plan:  Sinusitis, treat with Levaquin and a steroid shot.  Gershon CraneStephen Tremane Spurgeon, MD

## 2018-02-11 NOTE — Addendum Note (Signed)
Addended by: Gracelyn NurseBLACKWELL, SHELBY P on: 02/11/2018 01:19 PM   Modules accepted: Orders

## 2018-02-14 DIAGNOSIS — Z6838 Body mass index (BMI) 38.0-38.9, adult: Secondary | ICD-10-CM | POA: Diagnosis not present

## 2018-02-14 DIAGNOSIS — Z01419 Encounter for gynecological examination (general) (routine) without abnormal findings: Secondary | ICD-10-CM | POA: Diagnosis not present

## 2018-02-16 ENCOUNTER — Ambulatory Visit (INDEPENDENT_AMBULATORY_CARE_PROVIDER_SITE_OTHER): Payer: BLUE CROSS/BLUE SHIELD | Admitting: Licensed Clinical Social Worker

## 2018-02-16 DIAGNOSIS — F3341 Major depressive disorder, recurrent, in partial remission: Secondary | ICD-10-CM | POA: Diagnosis not present

## 2018-02-18 DIAGNOSIS — Z1231 Encounter for screening mammogram for malignant neoplasm of breast: Secondary | ICD-10-CM | POA: Diagnosis not present

## 2018-02-18 DIAGNOSIS — M1711 Unilateral primary osteoarthritis, right knee: Secondary | ICD-10-CM | POA: Diagnosis not present

## 2018-02-21 ENCOUNTER — Other Ambulatory Visit: Payer: Self-pay | Admitting: Obstetrics and Gynecology

## 2018-02-21 DIAGNOSIS — R928 Other abnormal and inconclusive findings on diagnostic imaging of breast: Secondary | ICD-10-CM

## 2018-02-23 ENCOUNTER — Other Ambulatory Visit: Payer: Self-pay | Admitting: Obstetrics and Gynecology

## 2018-02-23 ENCOUNTER — Other Ambulatory Visit: Payer: Self-pay | Admitting: Neurosurgery

## 2018-02-23 ENCOUNTER — Ambulatory Visit
Admission: RE | Admit: 2018-02-23 | Discharge: 2018-02-23 | Disposition: A | Discharge status: Home / Self Care | Payer: BLUE CROSS/BLUE SHIELD | Source: Ambulatory Visit | Attending: Obstetrics and Gynecology | Admitting: Obstetrics and Gynecology

## 2018-02-23 DIAGNOSIS — R928 Other abnormal and inconclusive findings on diagnostic imaging of breast: Secondary | ICD-10-CM | POA: Diagnosis not present

## 2018-02-23 DIAGNOSIS — N6489 Other specified disorders of breast: Secondary | ICD-10-CM | POA: Diagnosis not present

## 2018-02-25 ENCOUNTER — Telehealth: Payer: Self-pay | Admitting: Vascular Surgery

## 2018-02-25 ENCOUNTER — Other Ambulatory Visit: Payer: Self-pay | Admitting: *Deleted

## 2018-02-25 NOTE — Telephone Encounter (Signed)
sch appt lvm 05/03/18 1030am f/u MD

## 2018-02-28 DIAGNOSIS — F411 Generalized anxiety disorder: Secondary | ICD-10-CM | POA: Diagnosis not present

## 2018-02-28 DIAGNOSIS — F331 Major depressive disorder, recurrent, moderate: Secondary | ICD-10-CM | POA: Diagnosis not present

## 2018-03-01 ENCOUNTER — Other Ambulatory Visit: Payer: Self-pay | Admitting: Obstetrics and Gynecology

## 2018-03-01 ENCOUNTER — Ambulatory Visit
Admission: RE | Admit: 2018-03-01 | Discharge: 2018-03-01 | Disposition: A | Discharge status: Home / Self Care | Payer: BLUE CROSS/BLUE SHIELD | Source: Ambulatory Visit | Attending: Obstetrics and Gynecology | Admitting: Obstetrics and Gynecology

## 2018-03-01 DIAGNOSIS — N6324 Unspecified lump in the left breast, lower inner quadrant: Secondary | ICD-10-CM | POA: Diagnosis not present

## 2018-03-01 DIAGNOSIS — N6082 Other benign mammary dysplasias of left breast: Secondary | ICD-10-CM | POA: Diagnosis not present

## 2018-03-01 DIAGNOSIS — N6489 Other specified disorders of breast: Secondary | ICD-10-CM

## 2018-03-02 ENCOUNTER — Ambulatory Visit: Payer: BLUE CROSS/BLUE SHIELD | Admitting: Licensed Clinical Social Worker

## 2018-03-09 ENCOUNTER — Encounter: Payer: Self-pay | Admitting: Family Medicine

## 2018-03-09 ENCOUNTER — Ambulatory Visit: Payer: BLUE CROSS/BLUE SHIELD | Admitting: Family Medicine

## 2018-03-09 ENCOUNTER — Encounter: Payer: Self-pay | Admitting: *Deleted

## 2018-03-09 VITALS — BP 124/88 | HR 89 | Temp 98.6°F | Wt 266.3 lb

## 2018-03-09 DIAGNOSIS — M48061 Spinal stenosis, lumbar region without neurogenic claudication: Secondary | ICD-10-CM | POA: Diagnosis not present

## 2018-03-09 DIAGNOSIS — F1190 Opioid use, unspecified, uncomplicated: Secondary | ICD-10-CM

## 2018-03-09 MED ORDER — OXYCODONE HCL 15 MG PO TABS: 15.0000 mg | ORAL_TABLET | ORAL | 0 refills | Status: DC | PRN

## 2018-03-09 MED ORDER — OXYCODONE HCL 15 MG PO TABS: 15.0000 mg | ORAL_TABLET | ORAL | 0 refills | Status: AC | PRN

## 2018-03-09 NOTE — Addendum Note (Signed)
Addended by: Marcellus Scott on: 03/09/2018 10:43 AM   Modules accepted: Orders

## 2018-03-09 NOTE — Progress Notes (Signed)
   Subjective:    Patient ID: [name], female    DOB: Oct 12, 1975, 42 y.o.   MRN: [id number]  HPI Here for her first pain management visit. She has chronic low back pain and she sees Dr. Donalee Citrin for neurosurgical care. He had been supplying her pain medication, but we will be taking this over as of today.  Indication for chronic opioid: low back pain  Medication and dose: Oxycodone 15 mg  # pills per month: 180 Last UDS date: 03-09-18 Opioid Treatment Agreement signed (Y/N): 03-09-18 Opioid Treatment Agreement last reviewed with patient:  03-09-18 N[facility] reviewed this encounter (include red flags):  03-09-18    Review of Systems  Constitutional: Negative.   Respiratory: Negative.   Cardiovascular: Negative.   Musculoskeletal: Positive for back pain.  Neurological: Negative.        Objective:   Physical Exam  Constitutional: She is oriented to person, place, and time. She appears well-developed and well-nourished.  Cardiovascular: Normal rate, regular rhythm, normal heart sounds and intact distal pulses.  Pulmonary/Chest: Effort normal and breath sounds normal.  Neurological: She is alert and oriented to person, place, and time.          Assessment & Plan:  Pain management, meds were refilled.  Gershon Crane, MD

## 2018-03-12 LAB — PAIN MGMT, PROFILE 8 W/CONF, U
6 Acetylmorphine: NEGATIVE ng/mL (ref <10)
Alcohol Metabolites: NEGATIVE ng/mL (ref <500)
Amphetamines: NEGATIVE ng/mL (ref <500)
Benzodiazepines: NEGATIVE ng/mL (ref <100)
Buprenorphine, Urine: NEGATIVE ng/mL (ref <5)
Cocaine Metabolite: NEGATIVE ng/mL (ref <150)
Codeine: NEGATIVE ng/mL (ref <50)
Creatinine: 106.2 mg/dL
Hydrocodone: NEGATIVE ng/mL (ref <50)
Hydromorphone: NEGATIVE ng/mL (ref <50)
MDMA: NEGATIVE ng/mL (ref <500)
Marijuana Metabolite: NEGATIVE ng/mL (ref <20)
Morphine: NEGATIVE ng/mL (ref <50)
Norhydrocodone: NEGATIVE ng/mL (ref <50)
Noroxycodone: 9510 ng/mL — ABNORMAL HIGH (ref <50)
Opiates: NEGATIVE ng/mL (ref <100)
Oxidant: NEGATIVE ug/mL (ref <200)
Oxycodone: 5479 ng/mL — ABNORMAL HIGH (ref <50)
Oxycodone: POSITIVE ng/mL — AB (ref <100)
Oxymorphone: 11572 ng/mL — ABNORMAL HIGH (ref <50)
pH: 6.35 (ref 4.5–9.0)

## 2018-03-14 DIAGNOSIS — F331 Major depressive disorder, recurrent, moderate: Secondary | ICD-10-CM | POA: Diagnosis not present

## 2018-03-14 DIAGNOSIS — F411 Generalized anxiety disorder: Secondary | ICD-10-CM | POA: Diagnosis not present

## 2018-03-16 ENCOUNTER — Ambulatory Visit (INDEPENDENT_AMBULATORY_CARE_PROVIDER_SITE_OTHER): Payer: BLUE CROSS/BLUE SHIELD | Admitting: Licensed Clinical Social Worker

## 2018-03-16 DIAGNOSIS — F3341 Major depressive disorder, recurrent, in partial remission: Secondary | ICD-10-CM | POA: Diagnosis not present

## 2018-03-21 ENCOUNTER — Encounter: Payer: Self-pay | Admitting: Family Medicine

## 2018-03-21 DIAGNOSIS — J329 Chronic sinusitis, unspecified: Secondary | ICD-10-CM

## 2018-03-21 NOTE — Telephone Encounter (Signed)
Dr. Fry please advise. Thanks  

## 2018-03-22 NOTE — Telephone Encounter (Signed)
I did the referral to Dr. Lazarus Salines

## 2018-03-24 ENCOUNTER — Other Ambulatory Visit: Payer: Self-pay | Admitting: Family Medicine

## 2018-03-28 ENCOUNTER — Other Ambulatory Visit: Payer: Self-pay | Admitting: Family Medicine

## 2018-03-30 ENCOUNTER — Ambulatory Visit: Payer: BLUE CROSS/BLUE SHIELD | Admitting: Licensed Clinical Social Worker

## 2018-03-31 ENCOUNTER — Telehealth: Payer: Self-pay | Admitting: Family Medicine

## 2018-03-31 NOTE — Telephone Encounter (Signed)
Request for medical Consultation to be filled out; placed in dr's folder.  Fax to: [phone], attn: Dr Tonye Becket

## 2018-04-01 DIAGNOSIS — M1711 Unilateral primary osteoarthritis, right knee: Secondary | ICD-10-CM | POA: Diagnosis not present

## 2018-04-04 NOTE — Telephone Encounter (Signed)
Forms have been completed and faxed to Dr. Yolanda Bonine

## 2018-04-08 DIAGNOSIS — J343 Hypertrophy of nasal turbinates: Secondary | ICD-10-CM | POA: Diagnosis not present

## 2018-04-08 DIAGNOSIS — J329 Chronic sinusitis, unspecified: Secondary | ICD-10-CM | POA: Diagnosis not present

## 2018-04-08 DIAGNOSIS — Z9089 Acquired absence of other organs: Secondary | ICD-10-CM | POA: Diagnosis not present

## 2018-04-12 ENCOUNTER — Encounter: Payer: Self-pay | Admitting: Family Medicine

## 2018-04-12 ENCOUNTER — Other Ambulatory Visit: Payer: Self-pay | Admitting: Adult Health

## 2018-04-12 ENCOUNTER — Other Ambulatory Visit: Payer: Self-pay | Admitting: Family Medicine

## 2018-04-12 DIAGNOSIS — M5440 Lumbago with sciatica, unspecified side: Secondary | ICD-10-CM | POA: Diagnosis not present

## 2018-04-12 DIAGNOSIS — J069 Acute upper respiratory infection, unspecified: Secondary | ICD-10-CM | POA: Diagnosis not present

## 2018-04-12 MED ORDER — GABAPENTIN 300 MG PO CAPS: 300.0000 mg | ORAL_CAPSULE | Freq: Three times a day (TID) | ORAL | 3 refills | Status: DC

## 2018-04-19 ENCOUNTER — Encounter: Payer: Self-pay | Admitting: Vascular Surgery

## 2018-04-19 ENCOUNTER — Ambulatory Visit (INDEPENDENT_AMBULATORY_CARE_PROVIDER_SITE_OTHER): Payer: BLUE CROSS/BLUE SHIELD | Admitting: Vascular Surgery

## 2018-04-19 ENCOUNTER — Other Ambulatory Visit: Payer: Self-pay

## 2018-04-19 VITALS — BP 122/88 | HR 97 | Resp 18 | Ht 69.0 in | Wt 270.6 lb

## 2018-04-19 DIAGNOSIS — M5137 Other intervertebral disc degeneration, lumbosacral region: Secondary | ICD-10-CM | POA: Diagnosis not present

## 2018-04-19 DIAGNOSIS — M51379 Other intervertebral disc degeneration, lumbosacral region without mention of lumbar back pain or lower extremity pain: Secondary | ICD-10-CM

## 2018-04-19 NOTE — Progress Notes (Signed)
Vascular and Vein Specialist of [facility]  Patient name: [name] MRN: [id number] DOB: 10/10/1975 Sex: female  REASON FOR VISIT: Discuss anterior exposure for L5-S1 fusion with Dr. Wynetta Emeryram  HPI: Hannah OmanMyranda [name] is a 42 y.o. female here today with her husband to discuss anterior exposure for L5-S1 fusion.  I had seen her in October 2018.  At that time her insurance did not approve her surgery and it was canceled.  Tends to have severe low back and pain in both legs worse on the right than on the left.  This has been progressive and she is now scheduled for surgery.  She had had prior posterior fusion for L3-4 and 4 5.  She does have a history of open appendectomy and laparoscopic cholecystectomy.  Also had a low abdominal incision for hysterectomy.  No history of cardiac or peripheral vascular disease  Past Medical History:  Diagnosis Date  . Allergy   . Anxiety   . Arthritis   . B12 deficiency   . Depression    not currently   . Dysrhythmia    hx tachy-was from medication nortriptylline  . Esophagitis    rx  . Fibromyalgia   . GERD (gastroesophageal reflux disease)   . History of echocardiogram    Echo 4/18: Mild concentric LVH, vigorous LVF, EF 65-70, normal wall motion, grade 1 diastolic dysfunction  . History of kidney stones   . Hyperlipidemia   . Intervertebral disc protrusion 01/11/2014  . Migraine   . Obesity (BMI 35.0-39.9 without comorbidity)   . Peripheral neuropathy   . Polycystic ovaries   . Spinal stenosis, lumbar   . Vitamin D deficiency     Family History  Problem Relation Age of Onset  . Fibromyalgia Mother   . Diabetes Mother   . Thyroid cancer Mother   . Liver disease Mother   . Neuropathy Mother   . Cirrhosis Mother        non-alcoholic  . Pulmonary embolism Mother        both lungs  . Hypertension Father   . Diabetes Father   . Heart disease Father   . Prostate cancer Father   . Heart attack Father        x  2  . Colon cancer Neg Hx   . Other Neg Hx        pheochromocytoma  . Colon polyps Neg Hx     SOCIAL HISTORY: Social History   Tobacco Use  . Smoking status: Former Smoker    Types: Cigarettes    Last attempt to quit: 1999    Years since quitting: 20.8  . Smokeless tobacco: Never Used  Substance Use Topics  . Alcohol use: No    Alcohol/week: 0.0 standard drinks    Allergies  Allergen Reactions  . Gadolinium Derivatives Swelling and Other (See Comments)    Arm swelling, tingling, & redness. Radiologist Dr. Grace IsaacWatts recommends an injection of steroids, or 13-hour prep if non-emergent, if patient needs a gadolinium based contrast in the future.  Marland Kitchen. Zoloft [Sertraline Hcl] Other (See Comments)    Hair loss  . Other Rash and Other (See Comments)    Glue or tape to seal up wound    Current Outpatient Medications  Medication Sig Dispense Refill  . ALPRAZolam (XANAX) 0.5 MG tablet TAKE 1 TABLET BY MOUTH THREE TIMES A DAY AS NEEDED 90 tablet 5  . calcium carbonate (TUMS - DOSED IN MG ELEMENTAL CALCIUM) 500 MG chewable tablet Chew  1 tablet by mouth 2 (two) times daily as needed for indigestion or heartburn.    . cetirizine (ZYRTEC) 10 MG tablet Take 10 mg by mouth daily.    . cyanocobalamin (,VITAMIN B-12,) 1000 MCG/ML injection INJECT 1 ML (1,000 MCG TOTAL) INTO THE MUSCLE EVERY 7 DAYS. 12 mL 2  . fluticasone (FLONASE) 50 MCG/ACT nasal spray Place 2 sprays into both nostrils daily.     Marland Kitchen gabapentin (NEURONTIN) 300 MG capsule Take 1 capsule (300 mg total) by mouth 3 (three) times daily. 90 capsule 3  . hydrochlorothiazide (HYDRODIURIL) 25 MG tablet TAKE 1 TABLET BY MOUTH EVERY DAY 90 tablet 0  . Multiple Vitamins-Minerals (MULTI-VITAMIN GUMMIES PO) Take 2 tablets by mouth daily.     . nitrofurantoin, macrocrystal-monohydrate, (MACROBID) 100 MG capsule Take 100 mg by mouth daily as needed (for uti prophylaxis, after intercourse).   2  . [START ON 05/09/2018] oxyCODONE (ROXICODONE) 15 MG  immediate release tablet Take 1 tablet (15 mg total) by mouth every 4 (four) hours as needed for pain. 180 tablet 0  . pantoprazole (PROTONIX) 40 MG tablet TAKE 1 TABLET BY MOUTH TWICE A DAY **NEED PA FOR BID** 180 tablet 1  . potassium chloride (KLOR-CON 10) 10 MEQ tablet Take 1 tablet (10 mEq total) by mouth daily. (Patient taking differently: Take 10 mEq by mouth daily as needed (for potassium levels). ) 90 tablet 3  . promethazine (PHENERGAN) 25 MG tablet TAKE 1 TABLET BY MOUTH EVERY 6 HOURS AS NEEDED FOR NAUSEA (Patient taking differently: TAKE 25 MG BY MOUTH EVERY 6 HOURS AS NEEDED FOR NAUSEA) 90 tablet 1  . simethicone (MYLICON) 80 MG chewable tablet Chew 160 mg by mouth every 6 (six) hours as needed (gas pain).    . sucralfate (CARAFATE) 1 GM/10ML suspension Take 10 mLs (1 g total) by mouth 4 (four) times daily -  with meals and at bedtime. (Patient taking differently: Take 1 g by mouth 3 (three) times daily as needed (gi issues). ) 420 mL 2  . SYRINGE-NEEDLE, DISP, 3 ML (B-D 3CC LUER-LOK SYR 25GX1") 25G X 1" 3 ML MISC USE AS DIRECTED 100 each 0  . SYRINGE-NEEDLE, DISP, 3 ML (B-D 3CC LUER-LOK SYR 25GX1") 25G X 1" 3 ML MISC USE AS DIRECTED 9 each 11  . Vitamin D, Ergocalciferol, (DRISDOL) 50000 units CAPS capsule TAKE 1 CAPSULE (50,000 UNITS TOTAL) BY MOUTH EVERY 7 (SEVEN) DAYS. 12 capsule 3  . fluconazole (DIFLUCAN) 150 MG tablet TAKE 1 TABLET (150 MG TOTAL) BY MOUTH DAILY. (Patient not taking: Reported on 04/19/2018) 5 tablet 5  . phentermine 37.5 MG capsule Take 1 capsule (37.5 mg total) by mouth every morning. (Patient not taking: Reported on 04/19/2018) 30 capsule 5  . SUMAtriptan (IMITREX) 100 MG tablet Take 1 tablet earliest onset of migraine.  May repeat once in 2 hours if headache persists or recurs. (Patient not taking: Reported on 04/19/2018) 10 tablet 2   No current facility-administered medications for this visit.     REVIEW OF SYSTEMS:  [X]  denotes positive finding, [ ]  denotes  negative finding Cardiac  Comments:  Chest pain or chest pressure:    Shortness of breath upon exertion:    Short of breath when lying flat:    Irregular heart rhythm:        Vascular    Pain in calf, thigh, or hip brought on by ambulation:    Pain in feet at night that wakes you up from your sleep:  Blood clot in your veins:    Leg swelling:  x         PHYSICAL EXAM: Vitals:   04/19/18 1045  BP: 122/88  Pulse: 97  Resp: 18  SpO2: 94%  Weight: 270 lb 9.6 oz (122.7 kg)  Height: 5\' 9"  (1.753 m)    GENERAL: The patient is a well-nourished female, in no acute distress. The vital signs are documented above. CARDIOVASCULAR: 2+ radial and 2+ dorsalis pedis pulses bilaterally PULMONARY: There is good air exchange  MUSCULOSKELETAL: There are no major deformities or cyanosis. NEUROLOGIC: No focal weakness or paresthesias are detected. SKIN: There are no ulcers or rashes noted. PSYCHIATRIC: The patient has a normal affect.  DATA:  None  MEDICAL ISSUES: Discussed my role for anterior exposure.  Discussed the low incision from below her umbilicus to the left over the rectus muscle and mobilization of the rectus muscle, intraperitoneal contents, left ureter and arterial venous structures overlying the spine.  Discussed the injury of all these particularly risk of venous injury.  Patient understands and wished to proceed with surgery as scheduled for 05/04/2018    Larina Earthly, MD [facility] Vascular and Vein Specialists of [facility] Tel [phone] Pager [phone]

## 2018-04-20 ENCOUNTER — Ambulatory Visit (INDEPENDENT_AMBULATORY_CARE_PROVIDER_SITE_OTHER): Payer: BLUE CROSS/BLUE SHIELD | Admitting: Licensed Clinical Social Worker

## 2018-04-20 DIAGNOSIS — F3341 Major depressive disorder, recurrent, in partial remission: Secondary | ICD-10-CM

## 2018-04-26 ENCOUNTER — Encounter (HOSPITAL_COMMUNITY)
Admission: RE | Admit: 2018-04-26 | Discharge: 2018-04-26 | Disposition: A | Discharge status: Home / Self Care | Payer: BLUE CROSS/BLUE SHIELD | Source: Ambulatory Visit | Attending: Neurosurgery | Admitting: Neurosurgery

## 2018-04-26 ENCOUNTER — Encounter (HOSPITAL_COMMUNITY): Payer: Self-pay

## 2018-04-26 ENCOUNTER — Other Ambulatory Visit: Payer: Self-pay

## 2018-04-26 DIAGNOSIS — Z01818 Encounter for other preprocedural examination: Secondary | ICD-10-CM | POA: Diagnosis not present

## 2018-04-26 DIAGNOSIS — Z8679 Personal history of other diseases of the circulatory system: Secondary | ICD-10-CM | POA: Diagnosis not present

## 2018-04-26 LAB — CBC
HCT: 44.4 % (ref 36.0–46.0)
Hemoglobin: 14.6 g/dL (ref 12.0–15.0)
MCH: 29.3 pg (ref 26.0–34.0)
MCHC: 32.9 g/dL (ref 30.0–36.0)
MCV: 89.2 fL (ref 80.0–100.0)
Platelets: 314 10*3/uL (ref 150–400)
RBC: 4.98 MIL/uL (ref 3.87–5.11)
RDW: 12.3 % (ref 11.5–15.5)
WBC: 12.2 10*3/uL — ABNORMAL HIGH (ref 4.0–10.5)
nRBC: 0 % (ref 0.0–0.2)

## 2018-04-26 LAB — BASIC METABOLIC PANEL
Anion gap: 8 (ref 5–15)
BUN: 15 mg/dL (ref 6–20)
CO2: 25 mmol/L (ref 22–32)
Calcium: 9 mg/dL (ref 8.9–10.3)
Chloride: 106 mmol/L (ref 98–111)
Creatinine, Ser: 0.89 mg/dL (ref 0.44–1.00)
Glucose, Bld: 102 mg/dL — ABNORMAL HIGH (ref 70–99)
Potassium: 4.2 mmol/L (ref 3.5–5.1)
Sodium: 139 mmol/L (ref 135–145)

## 2018-04-26 LAB — SURGICAL PCR SCREEN
MRSA, PCR: NEGATIVE
Staphylococcus aureus: NEGATIVE

## 2018-04-26 LAB — TYPE AND SCREEN
ABO/RH(D): O POS
Antibody Screen: NEGATIVE

## 2018-04-26 NOTE — Progress Notes (Signed)
PCP: Gershon CraneStephen Fry  Cardiologist: Sherryl MangesSteven Klein  DM: denies  SA: denies  Pt has earring that cannot be removed in left ear for migraines.  Told pt to call Dr. Lonie Peakram's office for clarification on what to do about removing earring.   Pt denies SOB, cough, fever, chest pain  Pt stated understanding of instructions given for DOS.

## 2018-04-26 NOTE — Pre-Procedure Instructions (Signed)
Janissa P Purdom  04/26/2018      [id number] [place], [place] - [address] [place] [address] Phone: [phone] Fax: [phone]    Your procedure is scheduled on December 4th.  Report to [facility] Admitting at 6:30 A.M.  Call this number if you have problems the morning of surgery:  [phone]   Remember:  Do not eat or drink after midnight.     Take these medicines the morning of surgery with A SIP OF WATER   Alprazolam (Xanax)  Nasal Spray - if needed  Gabapentin (Neurontin)  Protonix  Promethazine (phenergan)   7 days prior to surgery STOP taking any Aspirin(unless otherwise instructed by your surgeon), Aleve, Naproxen, Ibuprofen, Motrin, Advil, Goody's, BC's, all herbal medications, fish oil, and all vitamins     Do not wear jewelry, make-up or nail polish.  Do not wear lotions, powders, or perfumes, or deodorant.  Do not shave 48 hours prior to surgery.  Men may shave face and neck.  Do not bring valuables to the hospital.  [facility] is not responsible for any belongings or valuables.   [place]- Preparing For Surgery  Before surgery, you can play an important role. Because skin is not sterile, your skin needs to be as free of germs as possible. You can reduce the number of germs on your skin by washing with CHG (chlorahexidine gluconate) Soap before surgery.  CHG is an antiseptic cleaner which kills germs and bonds with the skin to continue killing germs even after washing.    Oral Hygiene is also important to reduce your risk of infection.  Remember - BRUSH YOUR TEETH THE MORNING OF SURGERY WITH YOUR REGULAR TOOTHPASTE  Please do not use if you have an allergy to CHG or antibacterial soaps. If your skin becomes reddened/irritated stop using the CHG.  Do not shave (including legs and underarms) for at least 48 hours prior to first CHG shower. It is OK to shave your face.  Please follow these instructions  carefully.   1. Shower the NIGHT BEFORE SURGERY and the MORNING OF SURGERY with CHG.   2. If you chose to wash your hair, wash your hair first as usual with your normal shampoo.  3. After you shampoo, rinse your hair and body thoroughly to remove the shampoo.  4. Use CHG as you would any other liquid soap. You can apply CHG directly to the skin and wash gently with a scrungie or a clean washcloth.   5. Apply the CHG Soap to your body ONLY FROM THE NECK DOWN.  Do not use on open wounds or open sores. Avoid contact with your eyes, ears, mouth and genitals (private parts). Wash Face and genitals (private parts)  with your normal soap.  6. Wash thoroughly, paying special attention to the area where your surgery will be performed.  7. Thoroughly rinse your body with warm water from the neck down.  8. DO NOT shower/wash with your normal soap after using and rinsing off the CHG Soap.  9. Pat yourself dry with a CLEAN TOWEL.  10. Wear CLEAN PAJAMAS to bed the night before surgery, wear comfortable clothes the morning of surgery  11. Place CLEAN SHEETS on your bed the night of your first shower and DO NOT SLEEP WITH PETS.   Day of Surgery:  Do not apply any deodorants/lotions.  Please wear clean clothes to the hospital/surgery center.   Remember to brush your  teeth WITH YOUR REGULAR TOOTHPASTE.   Contacts, dentures or bridgework may not be worn into surgery.  Leave your suitcase in the car.  After surgery it may be brought to your room.  For patients admitted to the hospital, discharge time will be determined by your treatment team.  Patients discharged the day of surgery will not be allowed to drive home.

## 2018-05-02 ENCOUNTER — Other Ambulatory Visit: Payer: Self-pay | Admitting: Neurosurgery

## 2018-05-02 DIAGNOSIS — M5440 Lumbago with sciatica, unspecified side: Secondary | ICD-10-CM

## 2018-05-03 ENCOUNTER — Ambulatory Visit: Payer: Self-pay | Admitting: Vascular Surgery

## 2018-05-03 ENCOUNTER — Other Ambulatory Visit: Payer: Self-pay | Admitting: Family Medicine

## 2018-05-03 ENCOUNTER — Ambulatory Visit
Admission: RE | Admit: 2018-05-03 | Discharge: 2018-05-03 | Disposition: A | Discharge status: Home / Self Care | Payer: BLUE CROSS/BLUE SHIELD | Source: Ambulatory Visit | Attending: Neurosurgery | Admitting: Neurosurgery

## 2018-05-03 DIAGNOSIS — M5440 Lumbago with sciatica, unspecified side: Secondary | ICD-10-CM

## 2018-05-03 DIAGNOSIS — Z01818 Encounter for other preprocedural examination: Secondary | ICD-10-CM | POA: Diagnosis not present

## 2018-05-03 MED ORDER — DEXTROSE 5 % IV SOLN
3.0000 g | INTRAVENOUS | Status: AC
  Administered 2018-05-04 (×2): 3 g via INTRAVENOUS
  Filled 2018-05-03: qty 3

## 2018-05-03 NOTE — Anesthesia Preprocedure Evaluation (Addendum)
Anesthesia Evaluation  Patient identified by MRN, date of birth, ID band Patient awake    Reviewed: Allergy & Precautions, NPO status , Patient's Chart, lab work & pertinent test results  History of Anesthesia Complications Negative for: history of anesthetic complications  Airway Mallampati: I  TM Distance: >3 FB Neck ROM: Full    Dental  (+) Chipped, Dental Advisory Given, Caps   Pulmonary former smoker,    breath sounds clear to auscultation       Cardiovascular hypertension, Pt. on medications (-) angina Rhythm:Regular Rate:Normal  '18 ECHO: EF 65-70%, wall motion and valves OK   Neuro/Psych  Headaches, Anxiety Depression Chronic back pain    GI/Hepatic Neg liver ROS, GERD  Controlled and Medicated,  Endo/Other  Morbid obesitypolycystic ovarian  Renal/GU negative Renal ROS     Musculoskeletal  (+) Fibromyalgia -  Abdominal (+) + obese,   Peds  Hematology   Anesthesia Other Findings   Reproductive/Obstetrics S/p hysterectomy                            Anesthesia Physical Anesthesia Plan  ASA: III  Anesthesia Plan: General   Post-op Pain Management:    Induction: Intravenous  PONV Risk Score and Plan: 4 or greater and Scopolamine patch - Pre-op, Dexamethasone and Ondansetron  Airway Management Planned: Oral ETT  Additional Equipment: Arterial line  Intra-op Plan:   Post-operative Plan: Extubation in OR  Informed Consent: I have reviewed the patients History and Physical, chart, labs and discussed the procedure including the risks, benefits and alternatives for the proposed anesthesia with the patient or authorized representative who has indicated his/her understanding and acceptance.   Dental advisory given  Plan Discussed with: CRNA and Surgeon  Anesthesia Plan Comments: (Plan routine monitors, a-line, GETA)       Anesthesia Quick Evaluation

## 2018-05-04 ENCOUNTER — Inpatient Hospital Stay (HOSPITAL_COMMUNITY): Payer: BLUE CROSS/BLUE SHIELD

## 2018-05-04 ENCOUNTER — Encounter (HOSPITAL_COMMUNITY)
Admission: RE | Disposition: A | Discharge status: Home Health | Payer: Self-pay | Source: Ambulatory Visit | Attending: Neurosurgery

## 2018-05-04 ENCOUNTER — Inpatient Hospital Stay (HOSPITAL_COMMUNITY)
Admission: RE | Admit: 2018-05-04 | Discharge: 2018-05-10 | DRG: 454 | Disposition: A | Discharge status: Home Health | Payer: BLUE CROSS/BLUE SHIELD | Source: Ambulatory Visit | Attending: Neurosurgery | Admitting: Neurosurgery

## 2018-05-04 ENCOUNTER — Other Ambulatory Visit: Payer: Self-pay

## 2018-05-04 ENCOUNTER — Encounter (HOSPITAL_COMMUNITY): Payer: Self-pay | Admitting: Certified Registered Nurse Anesthetist

## 2018-05-04 DIAGNOSIS — M48061 Spinal stenosis, lumbar region without neurogenic claudication: Principal | ICD-10-CM | POA: Diagnosis present

## 2018-05-04 DIAGNOSIS — M5136 Other intervertebral disc degeneration, lumbar region: Secondary | ICD-10-CM | POA: Diagnosis present

## 2018-05-04 DIAGNOSIS — Z79899 Other long term (current) drug therapy: Secondary | ICD-10-CM | POA: Diagnosis not present

## 2018-05-04 DIAGNOSIS — Z87891 Personal history of nicotine dependence: Secondary | ICD-10-CM

## 2018-05-04 DIAGNOSIS — Z6841 Body Mass Index (BMI) 40.0 and over, adult: Secondary | ICD-10-CM

## 2018-05-04 DIAGNOSIS — G629 Polyneuropathy, unspecified: Secondary | ICD-10-CM | POA: Diagnosis not present

## 2018-05-04 DIAGNOSIS — I10 Essential (primary) hypertension: Secondary | ICD-10-CM | POA: Diagnosis present

## 2018-05-04 DIAGNOSIS — M4807 Spinal stenosis, lumbosacral region: Secondary | ICD-10-CM | POA: Diagnosis not present

## 2018-05-04 DIAGNOSIS — Z888 Allergy status to other drugs, medicaments and biological substances status: Secondary | ICD-10-CM

## 2018-05-04 DIAGNOSIS — B379 Candidiasis, unspecified: Secondary | ICD-10-CM | POA: Diagnosis not present

## 2018-05-04 DIAGNOSIS — M5117 Intervertebral disc disorders with radiculopathy, lumbosacral region: Secondary | ICD-10-CM | POA: Diagnosis not present

## 2018-05-04 DIAGNOSIS — Z91048 Other nonmedicinal substance allergy status: Secondary | ICD-10-CM

## 2018-05-04 DIAGNOSIS — M4326 Fusion of spine, lumbar region: Secondary | ICD-10-CM | POA: Diagnosis not present

## 2018-05-04 DIAGNOSIS — M5127 Other intervertebral disc displacement, lumbosacral region: Secondary | ICD-10-CM | POA: Diagnosis not present

## 2018-05-04 DIAGNOSIS — K219 Gastro-esophageal reflux disease without esophagitis: Secondary | ICD-10-CM | POA: Diagnosis not present

## 2018-05-04 DIAGNOSIS — M797 Fibromyalgia: Secondary | ICD-10-CM | POA: Diagnosis present

## 2018-05-04 DIAGNOSIS — Z419 Encounter for procedure for purposes other than remedying health state, unspecified: Secondary | ICD-10-CM

## 2018-05-04 HISTORY — PX: ANTERIOR LUMBAR FUSION: SHX1170

## 2018-05-04 HISTORY — PX: ABDOMINAL EXPOSURE: SHX5708

## 2018-05-04 SURGERY — ANTERIOR LUMBAR FUSION 1 LEVEL
Anesthesia: General | Site: Spine Lumbar

## 2018-05-04 MED ORDER — CEFAZOLIN SODIUM 1 G IJ SOLR
INTRAMUSCULAR | Status: AC
  Filled 2018-05-04: qty 30

## 2018-05-04 MED ORDER — KETAMINE HCL 50 MG/5ML IJ SOSY
PREFILLED_SYRINGE | INTRAMUSCULAR | Status: AC
  Filled 2018-05-04: qty 5

## 2018-05-04 MED ORDER — THROMBIN 20000 UNITS EX SOLR
CUTANEOUS | Status: AC
  Filled 2018-05-04: qty 20000

## 2018-05-04 MED ORDER — SUGAMMADEX SODIUM 200 MG/2ML IV SOLN
INTRAVENOUS | Status: DC | PRN
  Administered 2018-05-04: 250 mg via INTRAVENOUS

## 2018-05-04 MED ORDER — DEXAMETHASONE SODIUM PHOSPHATE 4 MG/ML IJ SOLN
INTRAMUSCULAR | Status: DC | PRN
  Administered 2018-05-04: 10 mg via INTRAVENOUS

## 2018-05-04 MED ORDER — SODIUM CHLORIDE 0.9 % IV SOLN
INTRAVENOUS | Status: DC | PRN
  Administered 2018-05-04: 09:00:00

## 2018-05-04 MED ORDER — HYDROMORPHONE HCL 1 MG/ML IJ SOLN
0.2500 mg | INTRAMUSCULAR | Status: DC | PRN
  Administered 2018-05-04 (×4): 0.5 mg via INTRAVENOUS

## 2018-05-04 MED ORDER — FLUTICASONE PROPIONATE 50 MCG/ACT NA SUSP
2.0000 | Freq: Every day | NASAL | Status: DC
  Administered 2018-05-05 – 2018-05-10 (×6): 2 via NASAL
  Filled 2018-05-04: qty 16

## 2018-05-04 MED ORDER — SODIUM CHLORIDE 0.9 % IV SOLN
INTRAVENOUS | Status: DC | PRN
  Administered 2018-05-04: 25 ug/min via INTRAVENOUS

## 2018-05-04 MED ORDER — NITROFURANTOIN MONOHYD MACRO 100 MG PO CAPS
100.0000 mg | ORAL_CAPSULE | Freq: Every day | ORAL | Status: DC | PRN
  Filled 2018-05-04: qty 1

## 2018-05-04 MED ORDER — DEXAMETHASONE SODIUM PHOSPHATE 10 MG/ML IJ SOLN
INTRAMUSCULAR | Status: AC
  Filled 2018-05-04: qty 1

## 2018-05-04 MED ORDER — ROCURONIUM BROMIDE 100 MG/10ML IV SOLN
INTRAVENOUS | Status: DC | PRN
  Administered 2018-05-04 (×3): 50 mg via INTRAVENOUS

## 2018-05-04 MED ORDER — BUPIVACAINE LIPOSOME 1.3 % IJ SUSP
20.0000 mL | INTRAMUSCULAR | Status: AC
  Administered 2018-05-04: 20 mL
  Filled 2018-05-04: qty 20

## 2018-05-04 MED ORDER — LACTATED RINGERS IV SOLN
INTRAVENOUS | Status: DC | PRN
  Administered 2018-05-04 (×3): via INTRAVENOUS

## 2018-05-04 MED ORDER — DIPHENHYDRAMINE HCL 50 MG/ML IJ SOLN: 12.5000 mg | Freq: Four times a day (QID) | INTRAMUSCULAR | Status: DC | PRN

## 2018-05-04 MED ORDER — LORATADINE 10 MG PO TABS
10.0000 mg | ORAL_TABLET | Freq: Every day | ORAL | Status: DC
  Administered 2018-05-05 – 2018-05-10 (×6): 10 mg via ORAL
  Filled 2018-05-04 (×6): qty 1

## 2018-05-04 MED ORDER — VANCOMYCIN HCL 1 G IV SOLR
INTRAVENOUS | Status: DC | PRN
  Administered 2018-05-04: 1000 mg

## 2018-05-04 MED ORDER — BUPIVACAINE HCL (PF) 0.25 % IJ SOLN
INTRAMUSCULAR | Status: AC
  Filled 2018-05-04: qty 30

## 2018-05-04 MED ORDER — PANTOPRAZOLE SODIUM 40 MG PO TBEC
40.0000 mg | DELAYED_RELEASE_TABLET | Freq: Every day | ORAL | Status: DC
  Administered 2018-05-05 – 2018-05-10 (×6): 40 mg via ORAL
  Filled 2018-05-04 (×6): qty 1

## 2018-05-04 MED ORDER — MENTHOL 3 MG MT LOZG: 1.0000 | LOZENGE | OROMUCOSAL | Status: DC | PRN

## 2018-05-04 MED ORDER — ONDANSETRON HCL 4 MG PO TABS
4.0000 mg | ORAL_TABLET | Freq: Four times a day (QID) | ORAL | Status: DC | PRN
  Administered 2018-05-08: 4 mg via ORAL
  Filled 2018-05-04: qty 1

## 2018-05-04 MED ORDER — HYDROMORPHONE HCL 1 MG/ML IJ SOLN
INTRAMUSCULAR | Status: AC
  Filled 2018-05-04: qty 1

## 2018-05-04 MED ORDER — FENTANYL CITRATE (PF) 100 MCG/2ML IJ SOLN
INTRAMUSCULAR | Status: DC | PRN
  Administered 2018-05-04 (×3): 50 ug via INTRAVENOUS
  Administered 2018-05-04: 200 ug via INTRAVENOUS
  Administered 2018-05-04 (×4): 50 ug via INTRAVENOUS

## 2018-05-04 MED ORDER — SODIUM CHLORIDE 0.9% FLUSH: 9.0000 mL | INTRAVENOUS | Status: DC | PRN

## 2018-05-04 MED ORDER — CHLORHEXIDINE GLUCONATE 4 % EX LIQD: 60.0000 mL | Freq: Once | CUTANEOUS | Status: DC

## 2018-05-04 MED ORDER — NALOXONE HCL 0.4 MG/ML IJ SOLN: 0.4000 mg | INTRAMUSCULAR | Status: DC | PRN

## 2018-05-04 MED ORDER — VITAMIN D (ERGOCALCIFEROL) 1.25 MG (50000 UNIT) PO CAPS
50000.0000 [IU] | ORAL_CAPSULE | ORAL | Status: DC
  Administered 2018-05-09: 50000 [IU] via ORAL
  Filled 2018-05-04: qty 1

## 2018-05-04 MED ORDER — MIDAZOLAM HCL 5 MG/5ML IJ SOLN
INTRAMUSCULAR | Status: DC | PRN
  Administered 2018-05-04: 2 mg via INTRAVENOUS

## 2018-05-04 MED ORDER — DIPHENHYDRAMINE HCL 12.5 MG/5ML PO ELIX: 12.5000 mg | ORAL_SOLUTION | Freq: Four times a day (QID) | ORAL | Status: DC | PRN

## 2018-05-04 MED ORDER — HYDROCHLOROTHIAZIDE 25 MG PO TABS
25.0000 mg | ORAL_TABLET | Freq: Every day | ORAL | Status: DC
  Administered 2018-05-05 – 2018-05-10 (×6): 25 mg via ORAL
  Filled 2018-05-04 (×6): qty 1

## 2018-05-04 MED ORDER — PROPOFOL 10 MG/ML IV BOLUS
INTRAVENOUS | Status: DC | PRN
  Administered 2018-05-04: 160 mg via INTRAVENOUS

## 2018-05-04 MED ORDER — THROMBIN 20000 UNITS EX SOLR
CUTANEOUS | Status: DC | PRN
  Administered 2018-05-04: 09:00:00

## 2018-05-04 MED ORDER — PROPOFOL 10 MG/ML IV BOLUS
INTRAVENOUS | Status: AC
  Filled 2018-05-04: qty 40

## 2018-05-04 MED ORDER — KETAMINE HCL 10 MG/ML IJ SOLN
INTRAMUSCULAR | Status: DC | PRN
  Administered 2018-05-04 (×2): 10 mg via INTRAVENOUS
  Administered 2018-05-04: 20 mg via INTRAVENOUS
  Administered 2018-05-04: 10 mg via INTRAVENOUS

## 2018-05-04 MED ORDER — HYDROMORPHONE HCL 1 MG/ML IJ SOLN
1.0000 mg | INTRAMUSCULAR | Status: DC | PRN
  Administered 2018-05-05 – 2018-05-10 (×30): 1 mg via INTRAVENOUS
  Filled 2018-05-04 (×30): qty 1

## 2018-05-04 MED ORDER — SODIUM CHLORIDE 0.9% FLUSH
3.0000 mL | Freq: Two times a day (BID) | INTRAVENOUS | Status: DC
  Administered 2018-05-04 – 2018-05-10 (×11): 3 mL via INTRAVENOUS

## 2018-05-04 MED ORDER — ONDANSETRON HCL 4 MG/2ML IJ SOLN
INTRAMUSCULAR | Status: AC
  Filled 2018-05-04: qty 2

## 2018-05-04 MED ORDER — ADULT MULTIVITAMIN W/MINERALS CH
1.0000 | ORAL_TABLET | Freq: Every day | ORAL | Status: DC
  Administered 2018-05-06 – 2018-05-09 (×2): 1 via ORAL
  Filled 2018-05-04 (×6): qty 1

## 2018-05-04 MED ORDER — ACETAMINOPHEN 10 MG/ML IV SOLN
INTRAVENOUS | Status: DC | PRN
  Administered 2018-05-04: 1000 mg via INTRAVENOUS

## 2018-05-04 MED ORDER — LIDOCAINE-EPINEPHRINE 1 %-1:100000 IJ SOLN
INTRAMUSCULAR | Status: AC
  Filled 2018-05-04: qty 1

## 2018-05-04 MED ORDER — ONDANSETRON HCL 4 MG/2ML IJ SOLN: 4.0000 mg | Freq: Four times a day (QID) | INTRAMUSCULAR | Status: DC | PRN

## 2018-05-04 MED ORDER — SCOPOLAMINE 1 MG/3DAYS TD PT72
MEDICATED_PATCH | TRANSDERMAL | Status: AC
  Filled 2018-05-04: qty 1

## 2018-05-04 MED ORDER — PHENYLEPHRINE HCL 10 MG/ML IJ SOLN
INTRAMUSCULAR | Status: DC | PRN
  Administered 2018-05-04: 80 ug via INTRAVENOUS

## 2018-05-04 MED ORDER — LACTATED RINGERS IV SOLN
INTRAVENOUS | Status: DC | PRN
  Administered 2018-05-04: 09:00:00 via INTRAVENOUS

## 2018-05-04 MED ORDER — DEXAMETHASONE SODIUM PHOSPHATE 10 MG/ML IJ SOLN
10.0000 mg | Freq: Once | INTRAMUSCULAR | Status: AC
  Administered 2018-05-04: 10 mg via INTRAVENOUS

## 2018-05-04 MED ORDER — CEFAZOLIN SODIUM-DEXTROSE 2-4 GM/100ML-% IV SOLN
2.0000 g | Freq: Three times a day (TID) | INTRAVENOUS | Status: AC
  Administered 2018-05-04 – 2018-05-06 (×6): 2 g via INTRAVENOUS
  Filled 2018-05-04 (×6): qty 100

## 2018-05-04 MED ORDER — PROMETHAZINE HCL 25 MG/ML IJ SOLN
6.2500 mg | INTRAMUSCULAR | Status: DC | PRN
  Administered 2018-05-04: 12.5 mg via INTRAVENOUS

## 2018-05-04 MED ORDER — CYCLOBENZAPRINE HCL 10 MG PO TABS
ORAL_TABLET | ORAL | Status: AC
  Filled 2018-05-04: qty 1

## 2018-05-04 MED ORDER — ROCURONIUM BROMIDE 50 MG/5ML IV SOSY
PREFILLED_SYRINGE | INTRAVENOUS | Status: AC
  Filled 2018-05-04: qty 15

## 2018-05-04 MED ORDER — MEPERIDINE HCL 50 MG/ML IJ SOLN: 6.2500 mg | INTRAMUSCULAR | Status: DC | PRN

## 2018-05-04 MED ORDER — ACETAMINOPHEN 325 MG PO TABS
650.0000 mg | ORAL_TABLET | ORAL | Status: DC | PRN
  Administered 2018-05-05 – 2018-05-09 (×2): 650 mg via ORAL
  Filled 2018-05-04 (×2): qty 2

## 2018-05-04 MED ORDER — PROMETHAZINE HCL 25 MG PO TABS
25.0000 mg | ORAL_TABLET | Freq: Four times a day (QID) | ORAL | Status: DC | PRN
  Administered 2018-05-04 – 2018-05-07 (×3): 25 mg via ORAL
  Filled 2018-05-04 (×3): qty 1

## 2018-05-04 MED ORDER — THROMBIN 5000 UNITS EX SOLR
OROMUCOSAL | Status: DC | PRN
  Administered 2018-05-04: 09:00:00

## 2018-05-04 MED ORDER — CHLORHEXIDINE GLUCONATE CLOTH 2 % EX PADS: 6.0000 | MEDICATED_PAD | Freq: Once | CUTANEOUS | Status: DC

## 2018-05-04 MED ORDER — ALUM & MAG HYDROXIDE-SIMETH 200-200-20 MG/5ML PO SUSP: 30.0000 mL | Freq: Four times a day (QID) | ORAL | Status: DC | PRN

## 2018-05-04 MED ORDER — CYCLOBENZAPRINE HCL 10 MG PO TABS
10.0000 mg | ORAL_TABLET | Freq: Three times a day (TID) | ORAL | Status: DC | PRN
  Administered 2018-05-04 – 2018-05-09 (×8): 10 mg via ORAL
  Filled 2018-05-04 (×7): qty 1

## 2018-05-04 MED ORDER — PROMETHAZINE HCL 25 MG/ML IJ SOLN
INTRAMUSCULAR | Status: AC
  Filled 2018-05-04: qty 1

## 2018-05-04 MED ORDER — OXYCODONE HCL 5 MG PO TABS
15.0000 mg | ORAL_TABLET | ORAL | Status: DC | PRN
  Administered 2018-05-04: 15 mg via ORAL

## 2018-05-04 MED ORDER — LIDOCAINE 2% (20 MG/ML) 5 ML SYRINGE
INTRAMUSCULAR | Status: AC
  Filled 2018-05-04: qty 5

## 2018-05-04 MED ORDER — ACETAMINOPHEN 650 MG RE SUPP
650.0000 mg | RECTAL | Status: DC | PRN
  Filled 2018-05-04: qty 1

## 2018-05-04 MED ORDER — GABAPENTIN 300 MG PO CAPS
300.0000 mg | ORAL_CAPSULE | Freq: Two times a day (BID) | ORAL | Status: DC
  Administered 2018-05-04 – 2018-05-06 (×4): 300 mg via ORAL
  Filled 2018-05-04 (×4): qty 1

## 2018-05-04 MED ORDER — OXYCODONE HCL 5 MG PO TABS
ORAL_TABLET | ORAL | Status: AC
  Filled 2018-05-04: qty 3

## 2018-05-04 MED ORDER — SODIUM CHLORIDE (PF) 0.9 % IJ SOLN
INTRAMUSCULAR | Status: AC
  Filled 2018-05-04: qty 10

## 2018-05-04 MED ORDER — MIDAZOLAM HCL 2 MG/2ML IJ SOLN
INTRAMUSCULAR | Status: AC
  Filled 2018-05-04: qty 2

## 2018-05-04 MED ORDER — PANTOPRAZOLE SODIUM 40 MG IV SOLR: 40.0000 mg | Freq: Every day | INTRAVENOUS | Status: DC

## 2018-05-04 MED ORDER — VECURONIUM BROMIDE 10 MG IV SOLR
INTRAVENOUS | Status: DC | PRN
  Administered 2018-05-04: 3 mg via INTRAVENOUS
  Administered 2018-05-04 (×2): 2 mg via INTRAVENOUS

## 2018-05-04 MED ORDER — SUMATRIPTAN SUCCINATE 100 MG PO TABS
100.0000 mg | ORAL_TABLET | Freq: Once | ORAL | Status: DC | PRN
  Filled 2018-05-04: qty 1

## 2018-05-04 MED ORDER — SCOPOLAMINE 1 MG/3DAYS TD PT72
1.0000 | MEDICATED_PATCH | Freq: Once | TRANSDERMAL | Status: AC
  Administered 2018-05-04: 1 via TRANSDERMAL

## 2018-05-04 MED ORDER — POTASSIUM CHLORIDE ER 10 MEQ PO TBCR
10.0000 meq | EXTENDED_RELEASE_TABLET | Freq: Every day | ORAL | Status: DC | PRN
  Filled 2018-05-04: qty 1

## 2018-05-04 MED ORDER — ONDANSETRON HCL 4 MG/2ML IJ SOLN
4.0000 mg | Freq: Four times a day (QID) | INTRAMUSCULAR | Status: DC | PRN
  Administered 2018-05-06 – 2018-05-10 (×6): 4 mg via INTRAVENOUS
  Filled 2018-05-04 (×6): qty 2

## 2018-05-04 MED ORDER — ALPRAZOLAM 0.5 MG PO TABS
0.5000 mg | ORAL_TABLET | Freq: Three times a day (TID) | ORAL | Status: DC | PRN
  Administered 2018-05-05 – 2018-05-10 (×10): 0.5 mg via ORAL
  Filled 2018-05-04 (×10): qty 1

## 2018-05-04 MED ORDER — ONDANSETRON HCL 4 MG/2ML IJ SOLN
INTRAMUSCULAR | Status: DC | PRN
  Administered 2018-05-04: 4 mg via INTRAVENOUS

## 2018-05-04 MED ORDER — SIMETHICONE 80 MG PO CHEW
160.0000 mg | CHEWABLE_TABLET | Freq: Four times a day (QID) | ORAL | Status: DC | PRN
  Administered 2018-05-06: 160 mg via ORAL
  Filled 2018-05-04: qty 2

## 2018-05-04 MED ORDER — SODIUM CHLORIDE 0.9 % IV SOLN: 250.0000 mL | INTRAVENOUS | Status: DC

## 2018-05-04 MED ORDER — ACETAMINOPHEN 10 MG/ML IV SOLN
INTRAVENOUS | Status: AC
  Filled 2018-05-04: qty 100

## 2018-05-04 MED ORDER — VECURONIUM BROMIDE 10 MG IV SOLR
INTRAVENOUS | Status: AC
  Filled 2018-05-04: qty 10

## 2018-05-04 MED ORDER — MIDAZOLAM HCL 2 MG/2ML IJ SOLN
0.5000 mg | Freq: Once | INTRAMUSCULAR | Status: AC | PRN
  Administered 2018-05-04: 2 mg via INTRAVENOUS

## 2018-05-04 MED ORDER — LIDOCAINE-EPINEPHRINE 1 %-1:100000 IJ SOLN
INTRAMUSCULAR | Status: DC | PRN
  Administered 2018-05-04: 10 mL

## 2018-05-04 MED ORDER — PHENOL 1.4 % MT LIQD: 1.0000 | OROMUCOSAL | Status: DC | PRN

## 2018-05-04 MED ORDER — VANCOMYCIN HCL 1000 MG IV SOLR
INTRAVENOUS | Status: AC
  Filled 2018-05-04: qty 1000

## 2018-05-04 MED ORDER — THROMBIN 5000 UNITS EX SOLR
CUTANEOUS | Status: AC
  Filled 2018-05-04: qty 5000

## 2018-05-04 MED ORDER — LIDOCAINE HCL (CARDIAC) PF 100 MG/5ML IV SOSY
PREFILLED_SYRINGE | INTRAVENOUS | Status: DC | PRN
  Administered 2018-05-04: 20 mg via INTRAVENOUS

## 2018-05-04 MED ORDER — SODIUM CHLORIDE 0.9% FLUSH
3.0000 mL | INTRAVENOUS | Status: DC | PRN
  Administered 2018-05-06: 3 mL via INTRAVENOUS
  Filled 2018-05-04: qty 3

## 2018-05-04 MED ORDER — FENTANYL CITRATE (PF) 250 MCG/5ML IJ SOLN
INTRAMUSCULAR | Status: AC
  Filled 2018-05-04: qty 5

## 2018-05-04 MED ORDER — MEPERIDINE HCL 50 MG/ML IJ SOLN
INTRAMUSCULAR | Status: AC
  Filled 2018-05-04: qty 1

## 2018-05-04 MED ORDER — HYDROMORPHONE 1 MG/ML IV SOLN
INTRAVENOUS | Status: DC
  Administered 2018-05-04: 25 mg via INTRAVENOUS
  Administered 2018-05-05 (×2): 3 mg via INTRAVENOUS
  Filled 2018-05-04: qty 25

## 2018-05-04 MED ORDER — CYANOCOBALAMIN 1000 MCG/ML IJ SOLN
1000.0000 ug | INTRAMUSCULAR | Status: DC
  Administered 2018-05-09: 1000 ug via INTRAMUSCULAR
  Filled 2018-05-04: qty 1

## 2018-05-04 MED ORDER — CALCIUM CARBONATE ANTACID 500 MG PO CHEW: 1.0000 | CHEWABLE_TABLET | Freq: Two times a day (BID) | ORAL | Status: DC | PRN

## 2018-05-04 MED ORDER — 0.9 % SODIUM CHLORIDE (POUR BTL) OPTIME
TOPICAL | Status: DC | PRN
  Administered 2018-05-04 (×2): 1000 mL

## 2018-05-04 SURGICAL SUPPLY — 107 items
ANCHOR LUMBAR 27 (Anchor) ×8 IMPLANT
ANCHOR LUMBAR MIS 30 (Anchor) ×4 IMPLANT
APPLIER CLIP 11 MED OPEN (CLIP) ×8
BAG DECANTER FOR FLEXI CONT (MISCELLANEOUS) ×4 IMPLANT
BENZOIN TINCTURE PRP APPL 2/3 (GAUZE/BANDAGES/DRESSINGS) ×4 IMPLANT
BIT DRILL 5.0/4.0 (BIT) ×3 IMPLANT
BLADE SURG 11 STRL SS (BLADE) ×4 IMPLANT
BONE VIVIGEN FORMABLE 5.4CC (Bone Implant) ×8 IMPLANT
BOOT SUTURE AID YELLOW STND (SUTURE) ×4 IMPLANT
BUR CUTTER 7.0 ROUND (BURR) ×4 IMPLANT
BUR MATCHSTICK NEURO 3.0 LAGG (BURR) ×4 IMPLANT
CANISTER SUCT 3000ML PPV (MISCELLANEOUS) ×8 IMPLANT
CAP LOCKING (Cap) ×8 IMPLANT
CAP LOCKING 5.5 CREO (Cap) ×24 IMPLANT
CARTRIDGE OIL MAESTRO DRILL (MISCELLANEOUS) ×3 IMPLANT
CLIP APPLIE 11 MED OPEN (CLIP) ×6 IMPLANT
CONT SPEC 4OZ CLIKSEAL STRL BL (MISCELLANEOUS) ×4 IMPLANT
COVER BACK TABLE 60X90IN (DRAPES) ×4 IMPLANT
COVER WAND RF STERILE (DRAPES) IMPLANT
DERMABOND ADVANCED (GAUZE/BANDAGES/DRESSINGS) ×2
DERMABOND ADVANCED .7 DNX12 (GAUZE/BANDAGES/DRESSINGS) ×6 IMPLANT
DIFFUSER DRILL AIR PNEUMATIC (MISCELLANEOUS) ×4 IMPLANT
DRAPE C-ARM 42X72 X-RAY (DRAPES) ×12 IMPLANT
DRAPE C-ARMOR (DRAPES) ×4 IMPLANT
DRAPE HALF SHEET 40X57 (DRAPES) IMPLANT
DRAPE LAPAROTOMY 100X72X124 (DRAPES) ×8 IMPLANT
DRAPE SURG 17X23 STRL (DRAPES) ×4 IMPLANT
DRILL 5.0/4.0 (BIT) ×4
DRSG OPSITE 4X5.5 SM (GAUZE/BANDAGES/DRESSINGS) ×4 IMPLANT
DRSG OPSITE POSTOP 4X6 (GAUZE/BANDAGES/DRESSINGS) ×4 IMPLANT
DRSG OPSITE POSTOP 4X8 (GAUZE/BANDAGES/DRESSINGS) ×4 IMPLANT
DURAPREP 26ML APPLICATOR (WOUND CARE) ×8 IMPLANT
ELECT BLADE 4.0 EZ CLEAN MEGAD (MISCELLANEOUS) ×4
ELECT BLADE 6.5 EXT (BLADE) ×4 IMPLANT
ELECT REM PT RETURN 9FT ADLT (ELECTROSURGICAL) ×8
ELECTRODE BLDE 4.0 EZ CLN MEGD (MISCELLANEOUS) ×3 IMPLANT
ELECTRODE REM PT RTRN 9FT ADLT (ELECTROSURGICAL) ×6 IMPLANT
EVACUATOR 3/16  PVC DRAIN (DRAIN)
EVACUATOR 3/16 PVC DRAIN (DRAIN) IMPLANT
GAUZE 4X4 16PLY RFD (DISPOSABLE) ×4 IMPLANT
GAUZE SPONGE 4X4 12PLY STRL (GAUZE/BANDAGES/DRESSINGS) IMPLANT
GLOVE BIO SURGEON STRL SZ7 (GLOVE) IMPLANT
GLOVE BIO SURGEON STRL SZ8 (GLOVE) ×16 IMPLANT
GLOVE BIOGEL PI IND STRL 7.0 (GLOVE) IMPLANT
GLOVE BIOGEL PI IND STRL 7.5 (GLOVE) ×24 IMPLANT
GLOVE BIOGEL PI IND STRL 8 (GLOVE) ×3 IMPLANT
GLOVE BIOGEL PI IND STRL 8.5 (GLOVE) ×3 IMPLANT
GLOVE BIOGEL PI INDICATOR 7.0 (GLOVE)
GLOVE BIOGEL PI INDICATOR 7.5 (GLOVE) ×8
GLOVE BIOGEL PI INDICATOR 8 (GLOVE) ×1
GLOVE BIOGEL PI INDICATOR 8.5 (GLOVE) ×1
GLOVE ECLIPSE 7.5 STRL STRAW (GLOVE) IMPLANT
GLOVE ECLIPSE 8.5 STRL (GLOVE) ×4 IMPLANT
GLOVE INDICATOR 8.5 STRL (GLOVE) ×12 IMPLANT
GLOVE SS BIOGEL STRL SZ 7.5 (GLOVE) IMPLANT
GLOVE SUPERSENSE BIOGEL SZ 7.5 (GLOVE)
GLOVE SURG SS PI 8.0 STRL IVOR (GLOVE) ×4 IMPLANT
GOWN STRL REUS W/ TWL LRG LVL3 (GOWN DISPOSABLE) ×6 IMPLANT
GOWN STRL REUS W/ TWL XL LVL3 (GOWN DISPOSABLE) ×9 IMPLANT
GOWN STRL REUS W/TWL 2XL LVL3 (GOWN DISPOSABLE) ×4 IMPLANT
GOWN STRL REUS W/TWL LRG LVL3 (GOWN DISPOSABLE) ×2
GOWN STRL REUS W/TWL XL LVL3 (GOWN DISPOSABLE) ×3
HEMOSTAT POWDER KIT SURGIFOAM (HEMOSTASIS) ×4 IMPLANT
KIT BASIN OR (CUSTOM PROCEDURE TRAY) ×8 IMPLANT
KIT TURNOVER KIT B (KITS) ×4 IMPLANT
LOOP VESSEL MAXI BLUE (MISCELLANEOUS) ×4 IMPLANT
LOOP VESSEL MINI RED (MISCELLANEOUS) ×4 IMPLANT
NEEDLE HYPO 25X1 1.5 SAFETY (NEEDLE) ×4 IMPLANT
NEEDLE SPNL 18GX3.5 QUINCKE PK (NEEDLE) ×4 IMPLANT
NS IRRIG 1000ML POUR BTL (IV SOLUTION) ×8 IMPLANT
OIL CARTRIDGE MAESTRO DRILL (MISCELLANEOUS) ×4
PACK LAMINECTOMY NEURO (CUSTOM PROCEDURE TRAY) ×8 IMPLANT
PAD ARMBOARD 7.5X6 YLW CONV (MISCELLANEOUS) ×20 IMPLANT
ROD CREO 100MM SPINAL (Rod) ×8 IMPLANT
SCREW CORT CREO AMP 6.0-5.0X25 (Screw) ×8 IMPLANT
SPACER INDEPEND 29X39MM 15D 15 (Spacer) ×4 IMPLANT
SPONGE INTESTINAL PEANUT (DISPOSABLE) ×8 IMPLANT
SPONGE LAP 18X18 X RAY DECT (DISPOSABLE) IMPLANT
SPONGE LAP 4X18 RFD (DISPOSABLE) IMPLANT
SPONGE SURGIFOAM ABS GEL 100 (HEMOSTASIS) ×4 IMPLANT
STAPLER VISISTAT 35W (STAPLE) ×4 IMPLANT
STRIP CLOSURE SKIN 1/2X4 (GAUZE/BANDAGES/DRESSINGS) ×4 IMPLANT
SUT PDS AB 1 CTX 36 (SUTURE) ×4 IMPLANT
SUT PROLENE 4 0 RB 1 (SUTURE)
SUT PROLENE 4-0 RB1 .5 CRCL 36 (SUTURE) IMPLANT
SUT PROLENE 5 0 CC1 (SUTURE) ×8 IMPLANT
SUT PROLENE 6 0 C 1 30 (SUTURE) IMPLANT
SUT PROLENE 6 0 CC (SUTURE) IMPLANT
SUT SILK 0 TIES 10X30 (SUTURE) IMPLANT
SUT SILK 2 0 TIES 10X30 (SUTURE) ×4 IMPLANT
SUT SILK 2 0SH CR/8 30 (SUTURE) IMPLANT
SUT SILK 3 0 SH CR/8 (SUTURE) IMPLANT
SUT SILK 3 0 TIES 10X30 (SUTURE) IMPLANT
SUT SILK 3 0SH CR/8 30 (SUTURE) IMPLANT
SUT VIC AB 0 CT1 18XCR BRD8 (SUTURE) ×6 IMPLANT
SUT VIC AB 0 CT1 27 (SUTURE) ×2
SUT VIC AB 0 CT1 27XBRD ANBCTR (SUTURE) ×6 IMPLANT
SUT VIC AB 0 CT1 27XBRD ANTBC (SUTURE) IMPLANT
SUT VIC AB 0 CT1 8-18 (SUTURE) ×2
SUT VIC AB 2-0 CT1 18 (SUTURE) ×8 IMPLANT
SUT VIC AB 4-0 PS2 18 (SUTURE) ×4 IMPLANT
SUT VIC AB 4-0 PS2 27 (SUTURE) ×4 IMPLANT
TOWEL GREEN STERILE (TOWEL DISPOSABLE) ×4 IMPLANT
TOWEL GREEN STERILE FF (TOWEL DISPOSABLE) ×8 IMPLANT
TRAY FOLEY MTR SLVR 16FR STAT (SET/KITS/TRAYS/PACK) ×4 IMPLANT
TULIP CREP AMP 5.5MM (Orthopedic Implant) ×8 IMPLANT
WATER STERILE IRR 1000ML POUR (IV SOLUTION) ×4 IMPLANT

## 2018-05-04 NOTE — Anesthesia Postprocedure Evaluation (Signed)
Anesthesia Post Note  Patient: [name]  Procedure(s) Performed: Lumbar Five Sacral One Anterior lumbar interbody fusion (N/A Spine Lumbar) POSTERIOR LUMBAR FUSION ONE LEVEL (N/A Back) ABDOMINAL EXPOSURE (N/A )     Patient location during evaluation: PACU Anesthesia Type: General Level of consciousness: sedated, oriented and patient cooperative Pain management: pain level controlled Vital Signs Assessment: post-procedure vital signs reviewed and stable Respiratory status: spontaneous breathing, nonlabored ventilation, respiratory function stable and patient connected to nasal cannula oxygen Cardiovascular status: blood pressure returned to baseline and stable Postop Assessment: no apparent nausea or vomiting Anesthetic complications: no    Last Vitals:  Vitals:   05/04/18 1710 05/04/18 1735  BP: 138/84 133/73  Pulse: (!) 113 (!) 109  Resp: 16   Temp:  37 C  SpO2: 97% 97%    Last Pain:  Vitals:   05/04/18 1735  TempSrc: Oral  PainSc:                  [name],E. Jacayla Nordell

## 2018-05-04 NOTE — H&P (Signed)
[name] is an 42 y.o. female.   Chief Complaint: back pain HPI: 42 year old female with long-standing back pain previously undergone L3-L5 fusion. That healed well and patient did very well however start experiencing progress worsening low back pain with radiation and legs consistent with L5 and S1 nerve root pattern. 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 Due to patient's failure conservative treatment imaging findings and progression of clinical syndrome I recommended anterior lumbar interbody fusion at L5-S1 with posterior augmentation with cortical screws. I extensively went over the risks and benefits of the procedure with her as well as perioperative course expectations of outcome and alternatives of surgery and she understood and agreed to proceed forward as well as back we would have to expose her old hardware and extend rods down to include the new screws at S1.  Past Medical History:  Diagnosis Date  . Allergy   . Anxiety   . Arthritis   . B12 deficiency   . Depression    not currently   . Dysrhythmia    hx tachy-was from medication nortriptylline  . Esophagitis    rx  . Fibromyalgia   . GERD (gastroesophageal reflux disease)   . History of echocardiogram    Echo 4/18: Mild concentric LVH, vigorous LVF, EF 65-70, normal wall motion, grade 1 diastolic dysfunction  . History of kidney stones   . Hyperlipidemia   . Intervertebral disc protrusion 01/11/2014  . Migraine   . Obesity (BMI 35.0-39.9 without comorbidity)   . Peripheral neuropathy   . Polycystic ovaries   . Spinal stenosis, lumbar   . Vitamin D deficiency     Past Surgical History:  Procedure Laterality Date  . 24 HOUR PH STUDY N/A 05/27/2016   Procedure: 24 HOUR PH STUDY;  Surgeon: Ruffin Frederick, MD;  Location: [facility];  Service: Gastroenterology;  Laterality: N/A;  .  APPENDECTOMY    . BACK SURGERY  2012   left laminectomy at L3-4 per Dr. Phoebe Perch   . bladder tack  2012  . CHOLECYSTECTOMY N/A 10/03/2015   Procedure: LAPAROSCOPIC CHOLECYSTECTOMY;  Surgeon: Rodman Pickle, MD;  Location[facility] OR;  Service: General;  Laterality: N/A;  . ESOPHAGEAL MANOMETRY N/A 05/27/2016   Procedure: ESOPHAGEAL MANOMETRY (EM);  Surgeon: Ruffin Frederick, MD;  Location: [facility];  Service: Gastroenterology;  Laterality: N/A;  . EXCISION OF SKIN TAG  10/03/2015   Procedure: EXCISION OF SKIN TAG;  Surgeon: De Blanch Kinsinger, MD;  Location: [facility] OR;  Service: General;;  . LUMBAR DISC SURGERY  01-31-14   per Dr. [name]   . OOPHORECTOMY     left overy  . OVARIAN CYST REMOVAL    . RECTOCELE REPAIR    . SINUSOTOMY  12/14/06  . spinal fusion  2016/10  . TONSILLECTOMY    . TONSILLECTOMY    . VAGINAL HYSTERECTOMY    . WRIST GANGLION EXCISION Left     Family History  Problem Relation Age of Onset  . Fibromyalgia Mother   . Diabetes Mother   . Thyroid cancer Mother   . Liver disease Mother   . Neuropathy Mother   . Cirrhosis Mother        non-alcoholic  . Pulmonary embolism Mother        both lungs  . Hypertension Father   . Diabetes Father   . Heart disease Father   . Prostate cancer Father   .  Heart attack Father        x 2  . Colon cancer Neg Hx   . Other Neg Hx        pheochromocytoma  . Colon polyps Neg Hx    Social History:  reports that she quit smoking about 20 years ago. Her smoking use included cigarettes. She has never used smokeless tobacco. She reports that she does not drink alcohol or use drugs.  Allergies:  Allergies  Allergen Reactions  . Gadolinium Derivatives Swelling and Other (See Comments)    Arm swelling, tingling, & redness. Radiologist Dr. Grace IsaacWatts recommends an injection of steroids, or 13-hour prep if non-emergent, if patient needs a gadolinium based contrast in the future.  . Other Rash and Other (See Comments)    Glue or tape to  seal up wound  . Zoloft [Sertraline Hcl] Other (See Comments)    Hair loss    Medications Prior to Admission  Medication Sig Dispense Refill  . ALPRAZolam (XANAX) 0.5 MG tablet TAKE 1 TABLET BY MOUTH THREE TIMES A DAY AS NEEDED (Patient taking differently: Take 0.5 mg by mouth 3 (three) times daily as needed for anxiety. ) 90 tablet 5  . calcium carbonate (TUMS - DOSED IN MG ELEMENTAL CALCIUM) 500 MG chewable tablet Chew 1 tablet by mouth 2 (two) times daily as needed for indigestion or heartburn.    . cetirizine (ZYRTEC) 10 MG tablet Take 10 mg by mouth daily.    . cyanocobalamin (,VITAMIN B-12,) 1000 MCG/ML injection INJECT 1 ML (1,000 MCG TOTAL) INTO THE MUSCLE EVERY 7 DAYS. (Patient taking differently: Inject 1,000 mcg into the muscle once a week. INJECT 1 ML (1,000 MCG TOTAL) INTO THE MUSCLE EVERY 7 DAYS.) 12 mL 2  . fluticasone (FLONASE) 50 MCG/ACT nasal spray Place 2 sprays into both nostrils daily.     Marland Kitchen. gabapentin (NEURONTIN) 300 MG capsule Take 1 capsule (300 mg total) by mouth 3 (three) times daily. (Patient taking differently: Take 300 mg by mouth 2 (two) times daily. ) 90 capsule 3  . hydrochlorothiazide (HYDRODIURIL) 25 MG tablet TAKE 1 TABLET BY MOUTH EVERY DAY 90 tablet 0  . Multiple Vitamins-Minerals (MULTI-VITAMIN GUMMIES PO) Take 2 tablets by mouth daily.     Melene Muller. [START ON 05/09/2018] oxyCODONE (ROXICODONE) 15 MG immediate release tablet Take 1 tablet (15 mg total) by mouth every 4 (four) hours as needed for pain. 180 tablet 0  . pantoprazole (PROTONIX) 40 MG tablet TAKE 1 TABLET BY MOUTH TWICE A DAY **NEED PA FOR BID** (Patient taking differently: Take 40 mg by mouth daily. ) 180 tablet 1  . potassium chloride (KLOR-CON 10) 10 MEQ tablet Take 1 tablet (10 mEq total) by mouth daily. (Patient taking differently: Take 10 mEq by mouth daily as needed (for potassium levels). ) 90 tablet 3  . promethazine (PHENERGAN) 25 MG tablet TAKE 1 TABLET BY MOUTH EVERY 6 HOURS AS NEEDED FOR NAUSEA  (Patient taking differently: TAKE 25 MG BY MOUTH EVERY 6 HOURS AS NEEDED FOR NAUSEA) 90 tablet 1  . simethicone (MYLICON) 80 MG chewable tablet Chew 160 mg by mouth every 6 (six) hours as needed (gas pain).    . SUMAtriptan (IMITREX) 100 MG tablet Take 1 tablet earliest onset of migraine.  May repeat once in 2 hours if headache persists or recurs. 10 tablet 2  . Vitamin D, Ergocalciferol, (DRISDOL) 50000 units CAPS capsule TAKE 1 CAPSULE (50,000 UNITS TOTAL) BY MOUTH EVERY 7 (SEVEN) DAYS. 12 capsule 3  .  nitrofurantoin, macrocrystal-monohydrate, (MACROBID) 100 MG capsule Take 100 mg by mouth daily as needed (for uti prophylaxis, after intercourse).   2  . SYRINGE-NEEDLE, DISP, 3 ML (B-D 3CC LUER-LOK SYR 25GX1") 25G X 1" 3 ML MISC USE AS DIRECTED 100 each 0  . SYRINGE-NEEDLE, DISP, 3 ML (B-D 3CC LUER-LOK SYR 25GX1") 25G X 1" 3 ML MISC USE AS DIRECTED 9 each 11    No results found for this or any previous visit (from the past 48 hour(s)). Ct Lumbar Spine Wo Contrast  Result Date: 05/03/2018 CLINICAL DATA:  Preop for 05/04/2018.  Back pain with sciatica. EXAM: CT LUMBAR SPINE WITHOUT CONTRAST TECHNIQUE: Multidetector CT imaging of the lumbar spine was performed without intravenous contrast administration. Multiplanar CT image reconstructions were also generated. COMPARISON:  04/29/2017 lumbar MRI.  Lumbar spine CT 03/01/2017 FINDINGS: Segmentation: 5 lumbar type vertebral bodies Alignment: Normal Vertebrae: No acute fracture or focal pathologic process. L3-4 and L4-5 PLIF with solid arthrodesis. No hardware failure. Paraspinal and other soft tissues: Negative Disc levels: T12- L1: Unremarkable L1-L2: Unremarkable. L2-L3: Mild facet spurring.  No evident impingement L3-L4: PLIF with solid arthrodesis.  No bony impingement L4-L5: PLIF with solid arthrodesis.  No bony impingement L5-S1:Disc narrowing and bulge with endplate spurring. Bilateral facet spurring. Right more than left foraminal narrowing with  probable mass effect on the right L5 nerve root. IMPRESSION: 1. L5-S1 facet and endplate spurring encroaching on the right L5 foramen. 2. L3-4 and L4-5 PLIF with solid arthrodesis. Electronically Signed   By: Marnee Spring M.D.   On: 05/03/2018 14:56    Review of Systems  Musculoskeletal: Positive for back pain and joint pain.  Neurological: Positive for sensory change.    Blood pressure (!) 146/86, pulse 100, temperature 98.7 F (37.1 C), resp. rate 18, last menstrual period 12/13/1999, SpO2 98 %. Physical Exam  Constitutional: She is oriented to person, place, and time. She appears well-developed and well-nourished. She appears lethargic.  HENT:  Head: Normocephalic.  Eyes: Pupils are equal, round, and reactive to light.  Neck: Normal range of motion.  Respiratory: Effort normal.  GI: Soft. Bowel sounds are normal.  Neurological: She is oriented to person, place, and time. She has normal strength. She appears lethargic. GCS eye subscore is 4. GCS verbal subscore is 5. GCS motor subscore is 6.  5 out of 5 iliopsoas, quads, and she's, gastrocs, anterior tibialis, EHL.     Assessment/Plan 42 year old female presents for an L5-S1 anterior lumbar interbody fusion with posterior augmentation with cortical screws.  Rashon Rezek P, MD 05/04/2018, 7:58 AM

## 2018-05-04 NOTE — Anesthesia Procedure Notes (Signed)
Arterial Line Insertion Start/End12/08/2017 8:05 AM Performed by: Jairo BenJackson, Carswell, MD, Tillman AbideHawkins, Kirtis Challis B, CRNA, CRNA  Patient location: Pre-op. Preanesthetic checklist: patient identified, IV checked, site marked, risks and benefits discussed, surgical consent, monitors and equipment checked, pre-op evaluation, timeout performed and anesthesia consent Patient sedated Right, radial was placed Catheter size: 20 G Hand hygiene performed  and maximum sterile barriers used   Attempts: 2 Procedure performed without using ultrasound guided technique. Following insertion, dressing applied and Biopatch. Post procedure assessment: normal  Patient tolerated the procedure well with no immediate complications.

## 2018-05-04 NOTE — Progress Notes (Signed)
Called Dr. Wynetta Emeryram about pain management, verbal order for PCA full dose dilaudid given, see orders.  Hermina BartersBOWMAN, Meher Kucinski M, RN

## 2018-05-04 NOTE — Op Note (Signed)
    OPERATIVE REPORT  DATE OF SURGERY: 05/04/2018  PATIENT: [name], [age] y.o. female MRN: [id number]  DOB: 06/01/1975  PRE-OPERATIVE DIAGNOSIS: Degenerative disc disease  POST-OPERATIVE DIAGNOSIS:  Same  PROCEDURE: Anterior exposure for L5-S1 interbody fusion  SURGEON:  Gretta Beganodd Devaughn Savant, M.D.  Co-surgeon for the exposure Dr. Donalee CitrinGary Cram  ANESTHESIA: General  EBL: per anesthesia record  Total I/O In: 2500 [I.V.:2500] Out: 400 [Urine:200; Blood:200]  BLOOD ADMINISTERED: none  DRAINS: none  SPECIMEN: none  COUNTS CORRECT:  YES  PATIENT DISPOSITION:  PACU - hemodynamically stable  PROCEDURE DETAILS: The patient was taken to the operating placed supine position where the area of the abdomen was prepped and draped in usual sterile fashion crosstable lateral C-arm was used identify the level of the L5-S1 disc.  An incision was made from the midline to the left and carried down through the subcutaneous fat with electrocautery.  Anterior rectus sheath was opened in line with the incision.  The rectus muscle was mobilized medially and the retroperitoneal space was entered bluntly.  The intraperitoneal contents were reflected to the right.  The left ureter was reflected to the right.  Dissection was carried down to the level of the iliac vessels.  Blunt dissection over the L5-S1 disc was continued.  The patient had a large middle sacral vein and this was ligated and divided and middle sacral artery was clipped and divided.  Kitner dissector was used to give exposure of the L5-S1 disc.  The Thompson retractor was brought onto the field and the reverse lip 200 blades were positioned to the right and left of the L5-S1 disc.  The malleable 190 blades were positioned for superior and inferior exposure.  A spinal needle was placed in the intervertebral disc and C-arm was brought back onto the field to confirm the location.  The remainder procedure be dictated as a separate note by Dr.  Sanjuan Dameram   Miquel Lamson F. Judah Carchi, M.D., [facility]trFACS 05/04/2018 12:30 PM

## 2018-05-04 NOTE — Op Note (Signed)
Preoperative diagnosis: Degenerative disc disease lumbar spinal stenosis L5-S1  Postoperative diagnosis: Same  Procedure: #1 anterior lumbar interbody fusion L5-S1 utilizing the globus anterior interbody cage packed with vivigen  Procedure #2 posterior augmentation with bilateral placement of sacral alar screws and tying in the previously construct from L3-L5 with posterior lateral arthrodesis with vivigen  Surgeon: Donalee CitrinGary Aizlyn Schifano  Co-surgeon for procedure #1 Dr. Tawanna Coolerodd early  Asst. For procedure #2 Dr. Barnett AbuHenry Elsner  Anesthesia: Gen.  EBL: Minimal  History of present illness: A 42 year old female with long-standing history with her back previous undergone L3-L5 fusion did very well however his progress to last several months and years with worsening back pain and lamina of L5 radiculopathy. Workup revealed marked facet arthropathy facet mediated back pain some degenerative collapse at L5-S1. Due to her progressive clinical syndrome imaging findings and a conservative treatment I recommended interbody fusion L5-S1 and due to the predominance of back pain and her previous history I recommended doing an anterior lumbar interbody fusion with indirect decompression of the L5 nerve roots with posterior augmentation with sacral screws. I extensively went over the risks and benefitsof the operation as well as perioperative course expectations of outcome and alternatives of surgery and she understood and agreed to proceed 4.  Operative procedure: Patient brought into the or was used on general anesthesia positioned supine for the first part of the procedure the exposure will be dictated by Dr. Tawanna Coolerodd early in a separate operative note but after adequate anterior exposure had been achieved for L5-S1 fluoroscopy confirmed the level this space was incised and cleanout pituitary rongeurs Kerrison punches comminution of upgoing and straight curettes. After adequate discectomy had confirmed With lateral and posterior  margins selected a 15 mm 15 implant impacted with the vivigen and inserted under fluoroscopy placing the 3 Shanks with one Shank and L5 into and S1. This significantly opened up the disc space and indirectly decompressing the L5 foramen. There was a cups irrigated meticulous he states was maintained the retractor was removed and the wounds closed in layers with running Prolene in the external oblique fascia and subcuticular layers and with Vicryl. Patient was then repositioned for the second part of the case. After the patient was repositioned prone the Wilson frame her old incision was opened up and extended inferiorly scar tissues dissected free identify the spinous process of S1 and identified the screws from L3-L5 and exposed everything. Her 3-5 fusion did appear to be solid identified the facet joints at L5-S1 and was very difficult under fluoroscopy to see the pedicles so I chose and elected to do sacral alar screws so dissected out the a lot chose my entry points with the fluoroscopy fluoroscopic assistance cannulated the a lot probed tapped it and placed 60/50 by 25 mm Creole modular screws and the sacral alar. I then assembled the heads and disconnected the old hardware and placed a longer rod reconnecting everything together. I aggressively drilled down the facet joints and decorticated and packed some additional DuraGen along the facet joints posterior laterally from L5-S1. Then the wound scope was irrigated meticulous in space was maintained I closed the wound in layers with interrupted Vicryl running 4 subcuticular Dermabond benzo and Steri-Strips and a sterile dressing was applied patient recovered in stable condition. At the end of case all needle counts sponge counts were correct.

## 2018-05-04 NOTE — Anesthesia Procedure Notes (Signed)
Procedure Name: Intubation Date/Time: 05/04/2018 8:46 AM Performed by: Oletta Lamas, CRNA Pre-anesthesia Checklist: Patient identified, Emergency Drugs available, Suction available and Patient being monitored Patient Re-evaluated:Patient Re-evaluated prior to induction Oxygen Delivery Method: Circle System Utilized Preoxygenation: Pre-oxygenation with 100% oxygen Induction Type: IV induction Ventilation: Mask ventilation without difficulty Laryngoscope Size: Mac and 3 Grade View: Grade I Tube type: Oral Tube size: 7.0 mm Number of attempts: 1 Airway Equipment and Method: Stylet and Oral airway Placement Confirmation: ETT inserted through vocal cords under direct vision,  positive ETCO2 and breath sounds checked- equal and bilateral Secured at: 23 cm Tube secured with: Tape Dental Injury: Teeth and Oropharynx as per pre-operative assessment

## 2018-05-04 NOTE — Transfer of Care (Signed)
Immediate Anesthesia Transfer of Care Note  Patient: [name]  Procedure(s) Performed: Lumbar Five Sacral One Anterior lumbar interbody fusion (N/A Spine Lumbar) POSTERIOR LUMBAR FUSION ONE LEVEL (N/A Back) ABDOMINAL EXPOSURE (N/A )  Patient Location: PACU  Anesthesia Type:General  Level of Consciousness: drowsy  Airway & Oxygen Therapy: Patient Spontanous Breathing and Patient connected to nasal cannula oxygen  Post-op Assessment: Report given to RN and Post -op Vital signs reviewed and stable  Post vital signs: Reviewed and stable  Last Vitals:  Vitals Value Taken Time  BP 150/95 05/04/2018  2:07 PM  Temp    Pulse 112 05/04/2018  2:12 PM  Resp 15 05/04/2018  2:12 PM  SpO2 97 % 05/04/2018  2:12 PM  Vitals shown include unvalidated device data.  Last Pain:  Vitals:   05/04/18 0739  PainSc: 7          Complications: No apparent anesthesia complications

## 2018-05-05 ENCOUNTER — Encounter (HOSPITAL_COMMUNITY): Payer: Self-pay | Admitting: Neurosurgery

## 2018-05-05 MED ORDER — SENNA 8.6 MG PO TABS
1.0000 | ORAL_TABLET | Freq: Every day | ORAL | Status: DC | PRN
  Administered 2018-05-06 – 2018-05-09 (×4): 8.6 mg via ORAL
  Filled 2018-05-05 (×4): qty 1

## 2018-05-05 MED ORDER — OXYCODONE HCL 5 MG PO TABS
30.0000 mg | ORAL_TABLET | ORAL | Status: DC | PRN
  Administered 2018-05-05 – 2018-05-10 (×26): 30 mg via ORAL
  Filled 2018-05-05 (×26): qty 6

## 2018-05-05 MED ORDER — DOCUSATE SODIUM 100 MG PO CAPS
100.0000 mg | ORAL_CAPSULE | Freq: Two times a day (BID) | ORAL | Status: DC
  Administered 2018-05-05 – 2018-05-10 (×10): 100 mg via ORAL
  Filled 2018-05-05 (×10): qty 1

## 2018-05-05 NOTE — Progress Notes (Signed)
Occupational Therapy Evaluation Patient Details Name: [name] MRN: [id number] DOB: 06/28/1975 Today's Date: 05/05/2018    History of Present Illness A 42 year old female with long-standing history with her back previous undergone L3-L5 fusion. s/p  PLIF 12/4.  PMH: anxiety, fibromyalgia,  multiple back surgeries   Clinical Impression   PTA, pt lived with her husband and was modified independent with limited mobility and required occasional A for LB ADL due to back pain. Pt pre-medicated. Required encouragement to progress OOB due to abdominal pain. Pt crying at times during session and appeared anxious. Encouraged relaxation techniques.  Required min A for mobility and max A for LB ADL @ RW level.  Will follow acutely for ADL and functional mobility education while adhering to back precaations to facilitate safe DC  Home with HHOT.     Follow Up Recommendations  Home health OT;Supervision - Intermittent    Equipment Recommendations  3 in 1 bedside commode;Other (comment)(RW)    Recommendations for Other Services       Precautions / Restrictions Precautions Precautions: Fall;Back Precaution Booklet Issued: Yes (comment) Required Braces or Orthoses: Spinal Brace Spinal Brace: Applied in sitting position;Lumbar corset Restrictions Weight Bearing Restrictions: No      Mobility Bed Mobility Overal bed mobility: Needs Assistance Bed Mobility: Rolling;Sidelying to Sit Rolling: Min assist;+2 for physical assistance Sidelying to sit: Mod assist;+2 for physical assistance       General bed mobility comments: Needed cues for log roll and assist due to significant pain in abdomen and back with pt crying   Transfers Overall transfer level: Needs assistance Equipment used: Rolling walker (2 wheeled) Transfers: Sit to/from Stand Sit to Stand: Min assist;+2 safety/equipment;From elevated surface         General transfer comment: Placed brace on with total assist. Bed  elevated with pt needing cues for hand placement and assist to stand.    Pt tearful taking incr time to stand and steady herself.      Balance Overall balance assessment: Needs assistance Sitting-balance support: No upper extremity supported;Feet supported Sitting balance-Leahy Scale: Poor Sitting balance - Comments: Pt requiring UE support for sitting EOB    Standing balance support: Bilateral upper extremity supported;During functional activity Standing balance-Leahy Scale: Poor Standing balance comment: Pt relies on UE support and external support.                            ADL either performed or assessed with clinical judgement   ADL Overall ADL's : Needs assistance/impaired     Grooming: Minimal assistance   Upper Body Bathing: Minimal assistance;Sitting   Lower Body Bathing: Maximal assistance;Sit to/from stand   Upper Body Dressing : Minimal assistance;Sitting   Lower Body Dressing: Maximal assistance;Sit to/from stand   Toilet Transfer: Minimal assistance;Ambulation;RW;BSC   Toileting- Clothing Manipulation and Hygiene: Maximal assistance;Sit to/from stand       Functional mobility during ADLs: Minimal assistance;+2 for safety/equipment;Rolling walker;Cueing for safety General ADL Comments: Began educatin gpt on compensatory techniques and use of AE for ADL; recommend pt purchase toilet tong     Vision Baseline Vision/History: Wears glasses       Perception     Praxis      Pertinent Vitals/Pain Pain Assessment: 0-10 Pain Score: 8  Pain Location: back Pain Descriptors / Indicators: Aching;Grimacing;Guarding;Numbness;Moaning Pain Intervention(s): Limited activity within patient's tolerance     Hand Dominance Right   Extremity/Trunk Assessment Upper Extremity Assessment Upper Extremity Assessment:  Overall WFL for tasks assessed   Lower Extremity Assessment Lower Extremity Assessment: Defer to PT evaluation LLE Sensation: decreased light  touch   Cervical / Trunk Assessment Cervical / Trunk Assessment: Kyphotic   Communication Communication Communication: No difficulties   Cognition Arousal/Alertness: Awake/alert Behavior During Therapy: Anxious(tearful) Overall Cognitive Status: Within Functional Limits for tasks assessed                                     General Comments       Exercises     Shoulder Instructions      Home Living Family/patient expects to be discharged to:: Private residence Living Arrangements: Spouse/significant other Available Help at Discharge: Family;Available 24 hours/day Type of Home: House Home Access: Stairs to enter Entergy CorporationEntrance Stairs-Number of Steps: 1+1 Entrance Stairs-Rails: None Home Layout: One level     Bathroom Shower/Tub: Chief Strategy OfficerTub/shower unit   Bathroom Toilet: Handicapped height Bathroom Accessibility: Yes How Accessible: Accessible via walker Home Equipment: Tub bench;Hand held shower head;Adaptive equipment(husband is looking to see if they have a RW and 3in1) Adaptive Equipment: Reacher Additional Comments: Adjustable bed      Prior Functioning/Environment Level of Independence: Needs assistance  Gait / Transfers Assistance Needed: Walked 5 min increments due to pain ADL's / Homemaking Assistance Needed: I with ADLS, husband helped with socks and shoes and shaving legs            OT Problem List: Decreased strength;Decreased activity tolerance;Decreased knowledge of use of DME or AE;Decreased knowledge of precautions;Obesity;Pain;Impaired sensation      OT Treatment/Interventions: Self-care/ADL training;DME and/or AE instruction;Therapeutic activities;Patient/family education    OT Goals(Current goals can be found in the care plan section) Acute Rehab OT Goals Patient Stated Goal: to go home OT Goal Formulation: With patient Time For Goal Achievement: 05/19/18 Potential to Achieve Goals: Good  OT Frequency: Min 3X/week   Barriers to D/C:             Co-evaluation PT/OT/SLP Co-Evaluation/Treatment: Yes(partial session) Reason for Co-Treatment: Necessary to address cognition/behavior during functional activity;To address functional/ADL transfers PT goals addressed during session: Mobility/safety with mobility OT goals addressed during session: ADL's and self-care      AM-PAC OT "6 Clicks" Daily Activity     Outcome Measure Help from another person eating meals?: None Help from another person taking care of personal grooming?: A Little Help from another person toileting, which includes using toliet, bedpan, or urinal?: A Lot Help from another person bathing (including washing, rinsing, drying)?: A Lot Help from another person to put on and taking off regular upper body clothing?: A Little Help from another person to put on and taking off regular lower body clothing?: A Lot 6 Click Score: 16   End of Session Equipment Utilized During Treatment: Gait belt;Back brace;Rolling walker Nurse Communication: Mobility status;Precautions  Activity Tolerance: Patient tolerated treatment well Patient left: in chair;with call bell/phone within reach;with family/visitor present  OT Visit Diagnosis: Other abnormalities of gait and mobility (R26.89);Muscle weakness (generalized) (M62.81);Pain Pain - part of body: (back)                Time: [id number] OT Time Calculation (min): 22 min Charges:  OT General Charges $OT Visit: 1 Visit OT Evaluation $OT Eval Moderate Complexity: 1 Mod  [name], OT/L   Acute OT Clinical Specialist Acute Rehabilitation Services Pager [phone] Office [phone]   [facility]portWARD,HILLARY 05/05/2018, 1:57 PM

## 2018-05-05 NOTE — Plan of Care (Signed)
  Problem: Education: Goal: Knowledge of General Education information will improve Description Including pain rating scale, medication(s)/side effects and non-pharmacologic comfort measures Outcome: Progressing   Problem: Education: Goal: Ability to verbalize activity precautions or restrictions will improve Outcome: Progressing Goal: Knowledge of the prescribed therapeutic regimen will improve Outcome: Progressing Goal: Understanding of discharge needs will improve Outcome: Progressing   Problem: Activity: Goal: Ability to avoid complications of mobility impairment will improve Outcome: Progressing Goal: Ability to tolerate increased activity will improve Outcome: Progressing Goal: Will remain free from falls Outcome: Progressing   Problem: Bowel/Gastric: Goal: Gastrointestinal status for postoperative course will improve Outcome: Progressing   Problem: Clinical Measurements: Goal: Ability to maintain clinical measurements within normal limits will improve Outcome: Progressing Goal: Postoperative complications will be avoided or minimized Outcome: Progressing Goal: Diagnostic test results will improve Outcome: Progressing   Problem: Pain Management: Goal: Pain level will decrease Outcome: Progressing   Problem: Skin Integrity: Goal: Will show signs of wound healing Outcome: Progressing   Problem: Health Behavior/Discharge Planning: Goal: Identification of resources available to assist in meeting health care needs will improve Outcome: Progressing   Problem: Bladder/Genitourinary: Goal: Urinary functional status for postoperative course will improve Outcome: Progressing   

## 2018-05-05 NOTE — Progress Notes (Signed)
Subjective: Patient reports doing well condition of back pain and incisional pain but improved leg pain  Objective: Vital signs in last 24 hours: Temp:  [97.9 F (36.6 C)-98.6 F (37 C)] 98.1 F (36.7 C) (12/05 0300) Pulse Rate:  [87-134] 109 (12/04 1735) Resp:  [12-23] 16 (12/05 0453) BP: (111-168)/(70-108) 111/84 (12/05 0300) SpO2:  [93 %-100 %] 99 % (12/05 0453) Arterial Line BP: (115-221)/(63-144) 142/78 (12/04 1710)  Intake/Output from previous day: 12/04 0701 - 12/05 0700 In: 4025 [I.V.:3825; IV Piggyback:200] Out: 2825 [Urine:2475; Blood:350] Intake/Output this shift: No intake/output data recorded.  strength out of 5 incisions clean dry and intact  Lab Results: No results for input(s): WBC, HGB, HCT, PLT in the last 72 hours. BMET No results for input(s): NA, K, CL, CO2, GLUCOSE, BUN, CREATININE, CALCIUM in the last 72 hours.  Studies/Results: Dg Lumbar Spine Complete  Result Date: 05/04/2018 CLINICAL DATA:  Anterior lumbar interbody fusion EXAM: DG C-ARM 61-120 MIN; LUMBAR SPINE - COMPLETE 4+ VIEW COMPARISON:  CT from yesterday FINDINGS: Fluoroscopy shows L5-S1 anterior lumbar interbody fusion. Graft positioning is unremarkable. Screws are also newly seen posteriorly at L5-S1. IMPRESSION: Fluoroscopy for L5-S1 ALIF and posterior-lateral arthrodesis. Electronically Signed   By: Marnee SpringJonathon  Watts M.D.   On: 05/04/2018 14:49   Ct Lumbar Spine Wo Contrast  Result Date: 05/03/2018 CLINICAL DATA:  Preop for 05/04/2018.  Back pain with sciatica. EXAM: CT LUMBAR SPINE WITHOUT CONTRAST TECHNIQUE: Multidetector CT imaging of the lumbar spine was performed without intravenous contrast administration. Multiplanar CT image reconstructions were also generated. COMPARISON:  04/29/2017 lumbar MRI.  Lumbar spine CT 03/01/2017 FINDINGS: Segmentation: 5 lumbar type vertebral bodies Alignment: Normal Vertebrae: No acute fracture or focal pathologic process. L3-4 and L4-5 PLIF with solid  arthrodesis. No hardware failure. Paraspinal and other soft tissues: Negative Disc levels: T12- L1: Unremarkable L1-L2: Unremarkable. L2-L3: Mild facet spurring.  No evident impingement L3-L4: PLIF with solid arthrodesis.  No bony impingement L4-L5: PLIF with solid arthrodesis.  No bony impingement L5-S1:Disc narrowing and bulge with endplate spurring. Bilateral facet spurring. Right more than left foraminal narrowing with probable mass effect on the right L5 nerve root. IMPRESSION: 1. L5-S1 facet and endplate spurring encroaching on the right L5 foramen. 2. L3-4 and L4-5 PLIF with solid arthrodesis. Electronically Signed   By: Marnee SpringJonathon  Watts M.D.   On: 05/03/2018 14:56   Dg C-arm 1-60 Min  Result Date: 05/04/2018 CLINICAL DATA:  Anterior lumbar interbody fusion EXAM: DG C-ARM 61-120 MIN; LUMBAR SPINE - COMPLETE 4+ VIEW COMPARISON:  CT from yesterday FINDINGS: Fluoroscopy shows L5-S1 anterior lumbar interbody fusion. Graft positioning is unremarkable. Screws are also newly seen posteriorly at L5-S1. IMPRESSION: Fluoroscopy for L5-S1 ALIF and posterior-lateral arthrodesis. Electronically Signed   By: Marnee SpringJonathon  Watts M.D.   On: 05/04/2018 14:49   Dg C-arm 1-60 Min  Result Date: 05/04/2018 CLINICAL DATA:  Anterior lumbar interbody fusion EXAM: DG C-ARM 61-120 MIN; LUMBAR SPINE - COMPLETE 4+ VIEW COMPARISON:  CT from yesterday FINDINGS: Fluoroscopy shows L5-S1 anterior lumbar interbody fusion. Graft positioning is unremarkable. Screws are also newly seen posteriorly at L5-S1. IMPRESSION: Fluoroscopy for L5-S1 ALIF and posterior-lateral arthrodesis. Electronically Signed   By: Marnee SpringJonathon  Watts M.D.   On: 05/04/2018 14:49   Dg Or Local Abdomen  Result Date: 05/04/2018 CLINICAL DATA:  Instrument count. EXAM: OR LOCAL ABDOMEN COMPARISON:  None. FINDINGS: Single portable radiograph obtained in the operating room was submitted. No evidence for suture needle or retained sponge. Spinal hardware from posterior  rod  fixation L3 through L5 identified. There has been interval anterior hardware fixation at L5-S1. IMPRESSION: No retained sponge or suture needles identified. Electronically Signed   By: Signa Kell M.D.   On: 05/04/2018 11:31    Assessment/Plan:   LOS: 1 day  postoperative day 1 some pain control issues PC is not working for will discontinue her back on orals and intermittent injections. Physical and occupational therapy.   Christopher Hink P 05/05/2018, 8:32 AM

## 2018-05-05 NOTE — Evaluation (Signed)
Physical Therapy Evaluation Patient Details Name: [name] MRN: [id number] DOB: 1976-03-22 Today's Date: 05/05/2018   History of Present Illness  A 42 year old female with long-standing history with her back previous undergone L3-L5 fusion did very well however his progress to last several months and years with worsening back pain and lamina of L5 radiculopathy. Workup revealed marked facet arthropathy facet mediated back pain some degenerative collapse at L5-S1.  PLIF 12/4.  PMH: anxiety, fibromyalgia, multiple back surgeries  Clinical Impression  Pt admitted with above diagnosis. Pt currently with functional limitations due to the deficits listed below (see PT Problem List). Pt was able to ambulate with RW with min to mod assist and cues with pain limiting pt significantly.  Needed max encouragement to ambulate.  Husband to provide care at home and is off currently to help pt.  Pt will benefit from skilled PT to increase their independence and safety with mobility to allow discharge to the venue listed below.      Follow Up Recommendations Home health PT;Supervision/Assistance - 24 hour    Equipment Recommendations  Other (comment)(Pt did have RW and 3N1 - to look and see if she has them)    Recommendations for Other Services       Precautions / Restrictions Precautions Precautions: Fall;Back Precaution Booklet Issued: Yes (comment) Required Braces or Orthoses: Spinal Brace Spinal Brace: Applied in sitting position;Lumbar corset Restrictions Weight Bearing Restrictions: No      Mobility  Bed Mobility Overal bed mobility: Needs Assistance Bed Mobility: Rolling;Sidelying to Sit Rolling: Min assist;+2 for physical assistance Sidelying to sit: Mod assist;+2 for physical assistance       General bed mobility comments: Needed cues for log roll and assist due to significant pain in abdomen and back with pt crying   Transfers Overall transfer level: Needs  assistance Equipment used: Rolling walker (2 wheeled) Transfers: Sit to/from Stand Sit to Stand: Min assist;+2 safety/equipment;From elevated surface         General transfer comment: Placed brace on with total assist. Bed elevated with pt needing cues for hand placement and assist to stand.    Pt tearful taking incr time to stand and steady herself.    Ambulation/Gait Ambulation/Gait assistance: Mod assist;Min assist;+2 safety/equipment Gait Distance (Feet): 65 Feet Assistive device: Rolling walker (2 wheeled) Gait Pattern/deviations: Step-to pattern;Decreased step length - right;Decreased step length - left;Decreased stride length;Shuffle;Antalgic;Trunk flexed;Wide base of support   Gait velocity interpretation: <1.31 ft/sec, indicative of household ambulator General Gait Details: Pt ambulated to bathroom andt then into hallway and back.  C/o "weird" sensations in buttocks and back.  Pt somewhat self limiting.    Stairs            Wheelchair Mobility    Modified Rankin (Stroke Patients Only)       Balance Overall balance assessment: Needs assistance Sitting-balance support: No upper extremity supported;Feet supported Sitting balance-Leahy Scale: Poor Sitting balance - Comments: Pt requiring UE support for sitting EOB    Standing balance support: Bilateral upper extremity supported;During functional activity Standing balance-Leahy Scale: Poor Standing balance comment: Pt relies on UE support and external support.                              Pertinent Vitals/Pain Pain Assessment: 0-10 Pain Score: 8  Pain Location: back Pain Descriptors / Indicators: Aching;Grimacing;Guarding;Numbness Pain Intervention(s): Limited activity within patient's tolerance;Monitored during session;Premedicated before session;Repositioned  VSS  Home Living  Family/patient expects to be discharged to:: Private residence Living Arrangements: Spouse/significant other Available  Help at Discharge: Family;Available 24 hours/day Type of Home: House Home Access: Stairs to enter Entrance Stairs-Rails: None Entrance Stairs-Number of Steps: 1+1 Home Layout: One level Home Equipment: Tub bench;Hand held shower head Additional Comments: Adjustable bed    Prior Function Level of Independence: Needs assistance   Gait / Transfers Assistance Needed: Walked 5 min increments due to pain  ADL's / Homemaking Assistance Needed: I with ADLS, husband helped with socks and shoes and shaving legs        Hand Dominance   Dominant Hand: Right    Extremity/Trunk Assessment   Upper Extremity Assessment Upper Extremity Assessment: Defer to OT evaluation    Lower Extremity Assessment Lower Extremity Assessment: LLE deficits/detail LLE Sensation: decreased light touch    Cervical / Trunk Assessment Cervical / Trunk Assessment: Kyphotic  Communication   Communication: No difficulties  Cognition Arousal/Alertness: Awake/alert Behavior During Therapy: WFL for tasks assessed/performed Overall Cognitive Status: Within Functional Limits for tasks assessed                                        General Comments      Exercises     Assessment/Plan    PT Assessment Patient needs continued PT services  PT Problem List Decreased activity tolerance;Decreased balance;Decreased mobility;Decreased knowledge of use of DME;Decreased safety awareness;Decreased knowledge of precautions;Pain       PT Treatment Interventions DME instruction;Gait training;Functional mobility training;Therapeutic activities;Therapeutic exercise;Balance training;Patient/family education;Stair training    PT Goals (Current goals can be found in the Care Plan section)  Acute Rehab PT Goals Patient Stated Goal: to go home PT Goal Formulation: With patient Time For Goal Achievement: 05/19/18 Potential to Achieve Goals: Good    Frequency Min 5X/week   Barriers to discharge         Co-evaluation PT/OT/SLP Co-Evaluation/Treatment: Yes Reason for Co-Treatment: For patient/therapist safety PT goals addressed during session: Mobility/safety with mobility         AM-PAC PT "6 Clicks" Mobility  Outcome Measure Help needed turning from your back to your side while in a flat bed without using bedrails?: A Lot Help needed moving from lying on your back to sitting on the side of a flat bed without using bedrails?: A Lot Help needed moving to and from a bed to a chair (including a wheelchair)?: A Little Help needed standing up from a chair using your arms (e.g., wheelchair or bedside chair)?: A Little Help needed to walk in hospital room?: A Little Help needed climbing 3-5 steps with a railing? : A Little 6 Click Score: 16    End of Session Equipment Utilized During Treatment: Gait belt;Back brace Activity Tolerance: Patient limited by fatigue;Patient limited by pain Patient left: in chair;with call bell/phone within reach;with family/visitor present Nurse Communication: Mobility status;Patient requests pain meds(IV loose) PT Visit Diagnosis: Unsteadiness on feet (R26.81);Muscle weakness (generalized) (M62.81);Pain Pain - part of body: (back)    Time: [id number] PT Time Calculation (min) (ACUTE ONLY): 52 min   Charges:   PT Evaluation $PT Eval Moderate Complexity: 1 Mod PT Treatments $Gait Training: 8-22 mins        [name],PT Acute Rehabilitation Services Pager:  [phone]  Office:  [phone]    [name] 05/05/2018, 1:46 PM

## 2018-05-05 NOTE — Social Work (Signed)
CSW acknowledging consult for SNF placement. Will follow for therapy recommendations.   Dennies Coate, MSW, LCSWA [place] Clinical Social Work [phone]   

## 2018-05-05 NOTE — Progress Notes (Signed)
Patient ID: [name], female   DOB: 09/12/1975, 42 y.o.   MRN: [id number] Comfortable currently.  Patient's husband in the room with her. Abdomen soft and nontender.  Palpable dorsalis pedis pulses bilaterally Stable status post exposure and interbody fusion.  Will not follow actively.  Please call if we can assist

## 2018-05-06 MED ORDER — DEXAMETHASONE SODIUM PHOSPHATE 10 MG/ML IJ SOLN
10.0000 mg | Freq: Four times a day (QID) | INTRAMUSCULAR | Status: AC
  Administered 2018-05-06 – 2018-05-07 (×4): 10 mg via INTRAVENOUS
  Filled 2018-05-06 (×4): qty 1

## 2018-05-06 MED ORDER — GABAPENTIN 300 MG PO CAPS
300.0000 mg | ORAL_CAPSULE | Freq: Three times a day (TID) | ORAL | Status: DC
  Administered 2018-05-06 – 2018-05-10 (×12): 300 mg via ORAL
  Filled 2018-05-06 (×12): qty 1

## 2018-05-06 MED ORDER — METHOCARBAMOL 1000 MG/10ML IJ SOLN: 1000.0000 mg | Freq: Four times a day (QID) | INTRAMUSCULAR | Status: DC

## 2018-05-06 MED ORDER — METHOCARBAMOL 1000 MG/10ML IJ SOLN
1000.0000 mg | Freq: Four times a day (QID) | INTRAVENOUS | Status: AC
  Administered 2018-05-06 (×3): 1000 mg via INTRAVENOUS
  Filled 2018-05-06 (×4): qty 10

## 2018-05-06 NOTE — Progress Notes (Signed)
Physical Therapy Treatment Patient Details Name: [name] MRN: [id number] DOB: 1976/01/06 Today's Date: 05/06/2018    History of Present Illness A 42 year old female with long-standing history with her back previous undergone L3-L5 fusion. s/p  PLIF 12/4.  PMH: anxiety, fibromyalgia,  multiple back surgeries    PT Comments    Patient tearful and crying today upon PT arrival. Reports increased pain from yesterday reporting it is "everywhere- LEs, butt, abdomen, across back." Lengthy discussion about pain management, importance of positioning, mobility etc. Pain is pt's biggest limiting factor. Only tolerated ambulation to/from bathroom due to sudden onset of nausea with movement. Of note, pt premedicated prior to session with PO and IV meds. MD present during session and working on adjusting meds. Able to recall 2/3back precautions. Will continue to follow.    Follow Up Recommendations  Home health PT;Supervision/Assistance - 24 hour     Equipment Recommendations  None recommended by PT(checking to see if she has DME)    Recommendations for Other Services       Precautions / Restrictions Precautions Precautions: Fall;Back Precaution Booklet Issued: No Precaution Comments: Reviewed back precautions. able to recall 2/3. Required Braces or Orthoses: Spinal Brace Spinal Brace: Applied in sitting position;Lumbar corset Restrictions Weight Bearing Restrictions: No    Mobility  Bed Mobility Overal bed mobility: Needs Assistance Bed Mobility: Rolling;Sidelying to Sit Rolling: Supervision Sidelying to sit: Supervision;HOB elevated       General bed mobility comments: Able to demonstrate log roll with use of rail and HOB elevated. Significant pain with bed mobility.   Transfers Overall transfer level: Needs assistance Equipment used: Rolling walker (2 wheeled) Transfers: Sit to/from Stand Sit to Stand: Min assist;From elevated surface         General transfer comment:  Cues for hand placement as pt pulling up on RW; Stood from EOB x1, from toilet x1 with increased time due to pain. transferred to chair post ambulation.  Ambulation/Gait Ambulation/Gait assistance: Min guard Gait Distance (Feet): 20 Feet(+ 12') Assistive device: Rolling walker (2 wheeled) Gait Pattern/deviations: Step-to pattern;Decreased step length - right;Decreased step length - left;Decreased stride length;Shuffle;Antalgic;Trunk flexed;Wide base of support;Step-through pattern Gait velocity: decreased   General Gait Details: Slow, guarded gait with cues to adhere to back precautions during mobility. Pain everywhere per pt report.    Stairs             Wheelchair Mobility    Modified Rankin (Stroke Patients Only)       Balance Overall balance assessment: Needs assistance Sitting-balance support: No upper extremity supported;Feet supported Sitting balance-Leahy Scale: Poor Sitting balance - Comments: Prefers UE support due to pain sitting EOB, total A to donn brace. ABle to attempt performing pericare but difficulty.    Standing balance support: During functional activity;Single extremity supported Standing balance-Leahy Scale: Fair Standing balance comment: ABle to stand at sink and wash hands with 1 UE support.                             Cognition Arousal/Alertness: Awake/alert Behavior During Therapy: Anxious Overall Cognitive Status: Within Functional Limits for tasks assessed                                 General Comments: Anxious and crying during session due to too much pain.       Exercises      General Comments  General comments (skin integrity, edema, etc.): Spouse stepped out fo room during session.      Pertinent Vitals/Pain Pain Assessment: 0-10 Pain Score: 10-Worst pain ever Pain Location: back, abdomen, LEs. Pain Descriptors / Indicators: Aching;Grimacing;Guarding;Numbness;Moaning;Burning;Operative site  guarding Pain Intervention(s): Monitored during session;Repositioned;Premedicated before session;Limited activity within patient's tolerance    Home Living                      Prior Function            PT Goals (current goals can now be found in the care plan section) Progress towards PT goals: Not progressing toward goals - comment(secondary to pain)    Frequency    Min 5X/week      PT Plan Current plan remains appropriate    Co-evaluation              AM-PAC PT "6 Clicks" Mobility   Outcome Measure  Help needed turning from your back to your side while in a flat bed without using bedrails?: A Little Help needed moving from lying on your back to sitting on the side of a flat bed without using bedrails?: A Little Help needed moving to and from a bed to a chair (including a wheelchair)?: A Little Help needed standing up from a chair using your arms (e.g., wheelchair or bedside chair)?: A Little Help needed to walk in hospital room?: A Little Help needed climbing 3-5 steps with a railing? : A Little 6 Click Score: 18    End of Session Equipment Utilized During Treatment: Gait belt;Back brace Activity Tolerance: Patient limited by pain Patient left: in chair;with call bell/phone within reach Nurse Communication: Mobility status;Other (comment)(nausea meds) PT Visit Diagnosis: Unsteadiness on feet (R26.81);Muscle weakness (generalized) (M62.81);Pain Pain - part of body: Leg(back, abdomen)     Time: [id number] PT Time Calculation (min) (ACUTE ONLY): 25 min  Charges:  $Gait Training: 8-22 mins $Therapeutic Activity: 8-22 mins                     Mylo RedShauna Valeria Boza, [place]PT, DPT Acute Rehabilitation Services Pager [phone] Office [phone]       Blake DivineShauna A Lanier EnsignHartshorne 05/06/2018, 9:51 AM

## 2018-05-06 NOTE — Progress Notes (Signed)
Subjective: Patient reports increased pain incisional area as well as anterior quads of both legs  this morning.  Objective: Vital signs in last 24 hours: Temp:  [97.7 F (36.5 C)-99.5 F (37.5 C)] 98.1 F (36.7 C) (12/06 0816) Pulse Rate:  [85-119] 119 (12/06 0816) BP: (119-140)/(72-91) 140/91 (12/06 0816) SpO2:  [95 %-98 %] 98 % (12/06 0816) Weight:  [124.6 kg] 124.6 kg (12/06 0824)  Intake/Output from previous day: 12/05 0701 - 12/06 0700 In: 920 [P.O.:720; IV Piggyback:200] Out: -  Intake/Output this shift: Total I/O In: 94.8 [IV Piggyback:94.8] Out: -   awake alert patient ambulating appears to have 5 out of 5 strength  Lab Results: No results for input(s): WBC, HGB, HCT, PLT in the last 72 hours. BMET No results for input(s): NA, K, CL, CO2, GLUCOSE, BUN, CREATININE, CALCIUM in the last 72 hours.  Studies/Results: Dg Lumbar Spine Complete  Result Date: 05/04/2018 CLINICAL DATA:  Anterior lumbar interbody fusion EXAM: DG C-ARM 61-120 MIN; LUMBAR SPINE - COMPLETE 4+ VIEW COMPARISON:  CT from yesterday FINDINGS: Fluoroscopy shows L5-S1 anterior lumbar interbody fusion. Graft positioning is unremarkable. Screws are also newly seen posteriorly at L5-S1. IMPRESSION: Fluoroscopy for L5-S1 ALIF and posterior-lateral arthrodesis. Electronically Signed   By: Marnee SpringJonathon  Watts M.D.   On: 05/04/2018 14:49   Dg C-arm 1-60 Min  Result Date: 05/04/2018 CLINICAL DATA:  Anterior lumbar interbody fusion EXAM: DG C-ARM 61-120 MIN; LUMBAR SPINE - COMPLETE 4+ VIEW COMPARISON:  CT from yesterday FINDINGS: Fluoroscopy shows L5-S1 anterior lumbar interbody fusion. Graft positioning is unremarkable. Screws are also newly seen posteriorly at L5-S1. IMPRESSION: Fluoroscopy for L5-S1 ALIF and posterior-lateral arthrodesis. Electronically Signed   By: Marnee SpringJonathon  Watts M.D.   On: 05/04/2018 14:49   Dg C-arm 1-60 Min  Result Date: 05/04/2018 CLINICAL DATA:  Anterior lumbar interbody fusion EXAM: DG  C-ARM 61-120 MIN; LUMBAR SPINE - COMPLETE 4+ VIEW COMPARISON:  CT from yesterday FINDINGS: Fluoroscopy shows L5-S1 anterior lumbar interbody fusion. Graft positioning is unremarkable. Screws are also newly seen posteriorly at L5-S1. IMPRESSION: Fluoroscopy for L5-S1 ALIF and posterior-lateral arthrodesis. Electronically Signed   By: Marnee SpringJonathon  Watts M.D.   On: 05/04/2018 14:49   Dg Or Local Abdomen  Result Date: 05/04/2018 CLINICAL DATA:  Instrument count. EXAM: OR LOCAL ABDOMEN COMPARISON:  None. FINDINGS: Single portable radiograph obtained in the operating room was submitted. No evidence for suture needle or retained sponge. Spinal hardware from posterior rod fixation L3 through L5 identified. There has been interval anterior hardware fixation at L5-S1. IMPRESSION: No retained sponge or suture needles identified. Electronically Signed   By: Signa Kellaylor  Stroud M.D.   On: 05/04/2018 11:31    Assessment/Plan: Postoperative day 2 anterior lumbar interbody fusion with posterior screw augmentation. Expected increased pain on postop day 2 will add Decadron increase her gabapentin and put her on IV Robaxin.  LOS: 2 days     [name] P 05/06/2018, 9:24 AM

## 2018-05-06 NOTE — Progress Notes (Signed)
Occupational Therapy Progress Note  Session limited today by lethargy from pain meds. Attempted to educate pt on AE for LB ADL, however pt unable to stay awake. Session focused on educating husband/caregiver on use of AE for LB ADL and hygiene after toileting. Discussed availability of AE. Husband verbalized understanding. Pt began talking about his concern for his wife and her depression due to her inability to do  "things she enjoys". He discussed the fear of "doing too much and messing up the hardware". Encouraged husband to discuss these concerns openly with Dr Wynetta Emeryram and have him guide the pt's level of activity to help her resume activities she enjoys in addition to helping with resources for weight loss (pt's goal).  Continue to recommend HHOT to facilitate independence with occupational performance.     05/06/18 1200  OT Visit Information  Last OT Received On 05/06/18  Assistance Needed +1  History of Present Illness A 42 year old female with long-standing history with her back previous undergone L3-L5 fusion. s/p  PLIF 12/4.  PMH: anxiety, fibromyalgia,  multiple back surgeries  Precautions  Precautions Fall;Back  Required Braces or Orthoses Spinal Brace  Spinal Brace Applied in sitting position;Lumbar corset  Pain Assessment  Pain Assessment Faces  Faces Pain Scale 2  Pain Location back, abdomen, LEs.  Pain Descriptors / Indicators Discomfort  Pain Intervention(s) Limited activity within patient's tolerance  Cognition  Arousal/Alertness Lethargic;Suspect due to medications  Behavior During Therapy [facility] for tasks assessed/performed  Overall Cognitive Status Difficult to assess  Difficult to assess due to Level of arousal  Upper Extremity Assessment  Upper Extremity Assessment Overall WFL for tasks assessed  ADL  General ADL Comments Attempted to educate pt on use of AE for ADL. Pt too lethargic and continued to fall asleep. Focus of session on family education (husband) on use and  availability fo AE. Educated husband on use of toilet tongs and flushable wipes. Husband verbalized understanding. Husband talked about his wife's depressionand how she has been dealing with her lack of activity and loss of the things she enjoys due to her back pain. Husband states they are expecting their first grndchild in the spring and this is the reason she is having her surgery - so she can hold her grandchild. Discussed need for pt/husband to communicate concerns with MD in order to guide further therapy/activity to increase pts activity level and resume joyful tasks. Husband states pt is frustrated with her wife becaurse she can't exercise in order to lose weight. Again, encouraged pt/husbnad to discuss these goals with her MD so pt can increase her activity level with the goal of losing weight.   OT - End of Session  Activity Tolerance Patient limited by lethargy;Other (comment) (due to pain meds)  Patient left in bed;with call bell/phone within reach;with family/visitor present  Nurse Communication Mobility status  OT Assessment/Plan  OT Plan Discharge plan remains appropriate  OT Visit Diagnosis Other abnormalities of gait and mobility (R26.89);Muscle weakness (generalized) (M62.81);Pain  Pain - part of body  (back)  OT Frequency (ACUTE ONLY) Min 3X/week  Follow Up Recommendations Home health OT;Supervision - Intermittent  OT Equipment 3 in 1 bedside commode;Other (comment)  AM-PAC OT "6 Clicks" Daily Activity Outcome Measure (Version 2)  Help from another person eating meals? 4  Help from another person taking care of personal grooming? 3  Help from another person toileting, which includes using toliet, bedpan, or urinal? 2  Help from another person bathing (including washing, rinsing, drying)? 2  Help from another person to put on and taking off regular upper body clothing? 3  Help from another person to put on and taking off regular lower body clothing? 2  6 Click Score 16  OT  Goal Progression  Progress towards OT goals Not progressing toward goals - comment (due to lethargy from pain meds)  Acute Rehab OT Goals  Patient Stated Goal to go home  OT Goal Formulation With patient  Time For Goal Achievement 05/19/18  Potential to Achieve Goals Good  ADL Goals  Pt Will Perform Lower Body Bathing with adaptive equipment;sit to/from stand;with modified independence  Pt Will Perform Lower Body Dressing with modified independence;sit to/from stand;with adaptive equipment  Pt Will Transfer to Toilet with modified independence;ambulating;bedside commode  Pt Will Perform Toileting - Clothing Manipulation and hygiene with modified independence;sit to/from stand;sitting/lateral leans;with adaptive equipment  Additional ADL Goal #1 Pt will independently verbalize 3 back precautions for ADL  OT Time Calculation  OT Start Time (ACUTE ONLY) 1150  OT Stop Time (ACUTE ONLY) 1205  OT Time Calculation (min) 15 min  OT General Charges  $OT Visit 1 Visit  OT Treatments  $Self Care/Home Management  8-22 mins  Luisa Dago, OT/L   Acute OT Clinical Specialist Acute Rehabilitation Services Pager [phone] Office [phone]

## 2018-05-07 MED ORDER — METHOCARBAMOL 1000 MG/10ML IJ SOLN
1000.0000 mg | Freq: Four times a day (QID) | INTRAVENOUS | Status: AC
  Administered 2018-05-07 – 2018-05-08 (×4): 1000 mg via INTRAVENOUS
  Filled 2018-05-07 (×4): qty 10

## 2018-05-07 NOTE — Progress Notes (Signed)
Subjective: Patient reports patient doing much better today significant improvement leg pain much better incisional soreness  Objective: Vital signs in last 24 hours: Temp:  [98.1 F (36.7 C)-99 F (37.2 C)] 98.2 F (36.8 C) (12/07 0300) Pulse Rate:  [73-120] 73 (12/07 0300) BP: (124-147)/(78-97) 124/84 (12/07 0300) SpO2:  [91 %-98 %] 92 % (12/07 0300) Weight:  [124.6 kg] 124.6 kg (12/06 0824)  Intake/Output from previous day: 12/06 0701 - 12/07 0700 In: 155 [IV Piggyback:155] Out: -  Intake/Output this shift: No intake/output data recorded.  strength 5 out of 5  Lab Results: No results for input(s): WBC, HGB, HCT, PLT in the last 72 hours. BMET No results for input(s): NA, K, CL, CO2, GLUCOSE, BUN, CREATININE, CALCIUM in the last 72 hours.  Studies/Results: No results found.  Assessment/Plan: Postop day 3 from anterior lumbar interbody fusion with posterior screw augmentation.Continue to mobilize today with physical occupational therapy work on pain management a full discharge tomorrow  LOS: 3 days     Kenyotta Dorfman P 05/07/2018, 7:58 AM

## 2018-05-07 NOTE — Progress Notes (Signed)
[facility] administered IV decad[facility] as ordered. At that time patient complained of pain at IV site. Noted slight swelling around IV site and tender to touch. IV removed. Attempted to start IV in right medial arm, unsuccessful. IV consult place for difficult IV stick, patient ordered multiple IV medications. Patient aware of plan.

## 2018-05-07 NOTE — Progress Notes (Signed)
Physical Therapy Treatment Patient Details Name: [name] MRN: [id number] DOB: 10/31/1975 Today's Date: 05/07/2018    History of Present Illness A 42 year old female with long-standing history with her back previous undergone L3-L5 fusion. s/p  PLIF 12/4.  PMH: anxiety, fibromyalgia,  multiple back surgeries    PT Comments    Pt admitted with above diagnosis. Pt currently with functional limitations due to the deficits listed below (see PT Problem List). Pt was able to ambulate with RW in hallway.  Pt very positive initially and as she walked she began to get anxious, c/o buttock and abdomen burning.  Pt needs max encouragement to complete tasks.  C/o pain once back in bed.  Acknowledged pain and told pt that it is normal to have some surgical pain with movement.  Pt was made comfortable once in bed.  Bed mobility improved with pt following precautions. Overall pt progressing but is highly anxious and self limiting.  Will follow acutely.  Pt will benefit from skilled PT to increase their independence and safety with mobility to allow discharge to the venue listed below.     Follow Up Recommendations  Home health PT;Supervision/Assistance - 24 hour     Equipment Recommendations  None recommended by PT(checking to see if she has DME)    Recommendations for Other Services       Precautions / Restrictions Precautions Precautions: Fall;Back Precaution Booklet Issued: No Precaution Comments: Reviewed back precautions Required Braces or Orthoses: Spinal Brace Spinal Brace: Applied in sitting position;Lumbar corset Restrictions Weight Bearing Restrictions: No Other Position/Activity Restrictions: reports she does not have to wear to/from bathroom     Mobility  Bed Mobility Overal bed mobility: Needs Assistance Bed Mobility: Rolling;Sidelying to Sit Rolling: Supervision Sidelying to sit: Min guard;Supervision       General bed mobility comments: Able to demonstrate log roll  without use of rail and HOB flat. Pt entered bed on the left side as she would at home and was able to successfully lie down 2 x with correct technique without cues.    Transfers Overall transfer level: Needs assistance Equipment used: Rolling walker (2 wheeled) Transfers: Sit to/from Stand Sit to Stand: From elevated surface;Min guard         General transfer comment: Cues for hand placement as pt pulling up on RW; Stood from EOB x1 with incr time due to pain.  Pt was able to don and doff brace without assist.   Ambulation/Gait Ambulation/Gait assistance: Min guard;Supervision Gait Distance (Feet): 110 Feet Assistive device: Rolling walker (2 wheeled) Gait Pattern/deviations: Step-to pattern;Decreased step length - right;Decreased step length - left;Decreased stride length;Shuffle;Antalgic;Trunk flexed;Wide base of support;Step-through pattern Gait velocity: decreased Gait velocity interpretation: <1.31 ft/sec, indicative of household ambulator General Gait Details: Slow, guarded gait with cues to adhere to back precautions during mobility. Pt with incr trunk flexion the further she ambulates and as she fatigues.  Pain and burning reported in left buttocks, abdoment and back per pt report.    Stairs             Wheelchair Mobility    Modified Rankin (Stroke Patients Only)       Balance Overall balance assessment: Needs assistance Sitting-balance support: No upper extremity supported;Feet supported Sitting balance-Leahy Scale: Poor Sitting balance - Comments: Prefers UE support due to pain sitting EOB.   Standing balance support: During functional activity;Bilateral upper extremity supported Standing balance-Leahy Scale: Poor Standing balance comment: Pt can take one hand off RW but overall relies  on RW especially as she fatigues.                             Cognition Arousal/Alertness: Awake/alert Behavior During Therapy: Anxious Overall Cognitive  Status: Within Functional Limits for tasks assessed                                 General Comments: anxious, distractable with fixation on everything that has happened in the past and everything she is currently feeling. repeating, revisiting each thing.  difficult to ease her anxiety and show how much she is actually able to do      Exercises      General Comments General comments (skin integrity, edema, etc.): Spouse was in room entire treatment.  He did ask if there was an overbed table that pt could use.  PT researched this and gave him an option.        Pertinent Vitals/Pain Pain Assessment: Faces Pain Score: 10-Worst pain ever Faces Pain Scale: Hurts even more Pain Location: back, abdomen, buttocks Pain Descriptors / Indicators: Discomfort Pain Intervention(s): Limited activity within patient's tolerance;Monitored during session;Premedicated before session;Repositioned    Home Living                      Prior Function            PT Goals (current goals can now be found in the care plan section) Acute Rehab PT Goals Patient Stated Goal: to go home Progress towards PT goals: Progressing toward goals    Frequency    Min 5X/week      PT Plan Current plan remains appropriate    Co-evaluation              AM-PAC PT "6 Clicks" Mobility   Outcome Measure  Help needed turning from your back to your side while in a flat bed without using bedrails?: A Little Help needed moving from lying on your back to sitting on the side of a flat bed without using bedrails?: A Little Help needed moving to and from a bed to a chair (including a wheelchair)?: A Little Help needed standing up from a chair using your arms (e.g., wheelchair or bedside chair)?: A Little Help needed to walk in hospital room?: A Little Help needed climbing 3-5 steps with a railing? : A Lot 6 Click Score: 17    End of Session Equipment Utilized During Treatment: Gait  belt;Back brace Activity Tolerance: Patient limited by pain;Patient limited by fatigue Patient left: with call bell/phone within reach;in bed;with family/visitor present Nurse Communication: Mobility status PT Visit Diagnosis: Unsteadiness on feet (R26.81);Muscle weakness (generalized) (M62.81);Pain Pain - part of body: Leg(back, abdomen)     Time: 1610-9604 PT Time Calculation (min) (ACUTE ONLY): 27 min  Charges:  $Gait Training: 8-22 mins $Therapeutic Activity: 8-22 mins                     Ranjit Ashurst,PT Acute Rehabilitation Services Pager:  [phone]  Office:  [phone]     Berline Lopes 05/07/2018, 11:01 AM

## 2018-05-07 NOTE — Progress Notes (Signed)
Occupational Therapy Treatment Patient Details Name: Jory EeMyranda P Pulsifer MRN: [id number] DOB: 01/13/1976 Today's Date: 05/07/2018    History of present illness A 42 year old female with long-standing history with her back previous undergone L3-L5 fusion. s/p  PLIF 12/4.  PMH: anxiety, fibromyalgia,  multiple back surgeries   OT comments  Pt. Seen for skilled OT session. Focus of session was completion of bed mobility and amb. To b.room for toileting tasks. Min guard A.  Noted mobility improvements, however states she remains limited by pain, anxiety also appears to be a factor.   Max encouragement required but has the ability and is motivated.  Will cont. With current POC with focus on increasing safety and independence with ADL completion.    Follow Up Recommendations  Home health OT;Supervision - Intermittent    Equipment Recommendations  3 in 1 bedside commode;Other (comment)    Recommendations for Other Services      Precautions / Restrictions Precautions Precautions: Fall;Back Precaution Comments: Reviewed back precautions Required Braces or Orthoses: Spinal Brace Spinal Brace: Applied in sitting position;Lumbar corset Restrictions Other Position/Activity Restrictions: reports she does not have to wear to/from b.room        Mobility Bed Mobility Overal bed mobility: Needs Assistance Bed Mobility: Rolling;Sidelying to Sit Rolling: Supervision Sidelying to sit: Min guard       General bed mobility comments: Able to demonstrate log roll with use of rail and HOB elevated. Significant pain with bed mobility but reports she has a bed with hospital functions at home. states no rail so i removed use of rail today. was able to complete but was concerned and states limitations from IV placement on her right wrist making use of R elbow difficult. however she was able to bring trunck upright while pushing through the elbow with max encouragement.   Transfers                       Balance                                           ADL either performed or assessed with clinical judgement   ADL Overall ADL's : Needs assistance/impaired                 Upper Body Dressing : Minimal assistance;Standing Upper Body Dressing Details (indicate cue type and reason): donned brace in standing at sink after using the b.room (pt. declined donning brace prior to amb. to b.room stating she doesnt have to wear it to/from b.room)     Toilet Transfer: Min guard;RW;Grab Production designer, theatre/television/filmb[employer];BSC;Regular Toilet Toilet Transfer Details (indicate cue type and reason): 3n1 over the commode Toileting- Clothing Manipulation and Hygiene: Supervision/safety;Sit to/from stand       Functional mobility during ADLs: Min guard;Rolling walker General ADL Comments: pt. demonstrates physical ability to complete desired tasks but is limited/distracted for full participation and completion of tasks without max cues, encouragement, and redirection.  becomes fixated on the past and whatever is currently hurting and bothering her that it empeeds her momentum during the task.      Vision       Perception     Praxis      Cognition Arousal/Alertness: Awake/alert Behavior During Therapy: Anxious Overall Cognitive Status: Within Functional Limits for tasks assessed  General Comments: anxious, distractable with fixation on everything that has happened in the past and everything she is currently feeling. repeating, revisiting each thing.  difficult to ease her anxiety and show how much she is actually able to do        Exercises     Shoulder Instructions       General Comments      Pertinent Vitals/ Pain       Pain Assessment: Faces Pain Score: 10-Worst pain ever Pain Location: back, abdomen, LEs, R hand at IV site Pain Descriptors / Indicators: Discomfort Pain Intervention(s): RN gave pain meds during session;Monitored during  session;Repositioned  Home Living                                          Prior Functioning/Environment              Frequency  Min 3X/week        Progress Toward Goals  OT Goals(current goals can now be found in the care plan section)  Progress towards OT goals: Progressing toward goals     Plan Discharge plan remains appropriate    Co-evaluation                 AM-PAC OT "6 Clicks" Daily Activity     Outcome Measure   Help from another person eating meals?: None Help from another person taking care of personal grooming?: A Little Help from another person toileting, which includes using toliet, bedpan, or urinal?: A Lot Help from another person bathing (including washing, rinsing, drying)?: A Lot Help from another person to put on and taking off regular upper body clothing?: A Little Help from another person to put on and taking off regular lower body clothing?: A Lot 6 Click Score: 16    End of Session Equipment Utilized During Treatment: Gait belt;Back brace;Rolling walker  OT Visit Diagnosis: Other abnormalities of gait and mobility (R26.89);Muscle weakness (generalized) (M62.81);Pain   Activity Tolerance Patient tolerated treatment well   Patient Left Other (comment)(amb. out of b.room with PT)   Nurse Communication          Time: 3016-0109 OT Time Calculation (min): 24 min  Charges: OT General Charges $OT Visit: 1 Visit OT Treatments $Self Care/Home Management : 23-37 mins  Robet Leu 05/07/2018, 10:47 AM

## 2018-05-08 MED ORDER — NYSTATIN 100000 UNIT/ML MT SUSP
5.0000 mL | Freq: Three times a day (TID) | OROMUCOSAL | Status: DC
  Administered 2018-05-08 – 2018-05-10 (×9): 500000 [IU] via ORAL
  Filled 2018-05-08 (×9): qty 5

## 2018-05-08 NOTE — Plan of Care (Signed)
  Problem: Education: Goal: Knowledge of General Education information will improve Description Including pain rating scale, medication(s)/side effects and non-pharmacologic comfort measures Outcome: Progressing   Problem: Education: Goal: Ability to verbalize activity precautions or restrictions will improve Outcome: Progressing Goal: Knowledge of the prescribed therapeutic regimen will improve Outcome: Progressing Goal: Understanding of discharge needs will improve Outcome: Progressing   Problem: Activity: Goal: Ability to avoid complications of mobility impairment will improve Outcome: Progressing Goal: Ability to tolerate increased activity will improve Outcome: Progressing Goal: Will remain free from falls Outcome: Progressing   Problem: Bowel/Gastric: Goal: Gastrointestinal status for postoperative course will improve Outcome: Progressing   Problem: Clinical Measurements: Goal: Ability to maintain clinical measurements within normal limits will improve Outcome: Progressing Goal: Postoperative complications will be avoided or minimized Outcome: Progressing Goal: Diagnostic test results will improve Outcome: Progressing   Problem: Skin Integrity: Goal: Will show signs of wound healing Outcome: Progressing   Problem: Health Behavior/Discharge Planning: Goal: Identification of resources available to assist in meeting health care needs will improve Outcome: Progressing   Problem: Bladder/Genitourinary: Goal: Urinary functional status for postoperative course will improve Outcome: Progressing   Problem: Pain Management: Goal: Pain level will decrease Outcome: Not Progressing Note:  Continues to call out for pain meds almost as often as they are available.

## 2018-05-08 NOTE — Progress Notes (Signed)
Subjective: Patient reports severe back and buttocks pain with some posterior leg pain. Tearful in the room  Objective: Vital signs in last 24 hours: Temp:  [97.6 F (36.4 C)-99 F (37.2 C)] 98.8 F (37.1 C) (12/08 0300) Pulse Rate:  [64-115] 66 (12/08 0300) BP: (106-140)/(68-95) 124/76 (12/08 0300) SpO2:  [93 %-97 %] 95 % (12/08 0300)  Intake/Output from previous day: 12/07 0701 - 12/08 0700 In: 630 [P.O.:380; I.V.:100; IV Piggyback:150] Out: -  Intake/Output this shift: No intake/output data recorded.  Neurologic: Grossly normal  Lab Results: Lab Results  Component Value Date   WBC 12.2 (H) 04/26/2018   HGB 14.6 04/26/2018   HCT 44.4 04/26/2018   MCV 89.2 04/26/2018   PLT 314 04/26/2018   No results found for: INR, PROTIME BMET Lab Results  Component Value Date   NA 139 04/26/2018   K 4.2 04/26/2018   CL 106 04/26/2018   CO2 25 04/26/2018   GLUCOSE 102 (H) 04/26/2018   BUN 15 04/26/2018   CREATININE 0.89 04/26/2018   CALCIUM 9.0 04/26/2018    Studies/Results: No results found.  Assessment/Plan: Postop day 4 from lumbar fusion. Still having severe pain. Goals to wean off of IV mediation and work with therapies. Rx for nystatin for thrush.    LOS: 4 days    [name] 05/08/2018, 7:59 AM

## 2018-05-09 MED ORDER — METHYLPREDNISOLONE 4 MG PO TBPK: 8.0000 mg | ORAL_TABLET | Freq: Every evening | ORAL | Status: DC

## 2018-05-09 MED ORDER — METHOCARBAMOL 500 MG PO TABS
750.0000 mg | ORAL_TABLET | Freq: Four times a day (QID) | ORAL | Status: DC | PRN
  Administered 2018-05-09 – 2018-05-10 (×4): 750 mg via ORAL
  Filled 2018-05-09 (×4): qty 2

## 2018-05-09 MED ORDER — METHYLPREDNISOLONE 4 MG PO TABS: 4.0000 mg | ORAL_TABLET | Freq: Three times a day (TID) | ORAL | Status: DC

## 2018-05-09 MED ORDER — METHYLPREDNISOLONE 4 MG PO TBPK
8.0000 mg | ORAL_TABLET | Freq: Every morning | ORAL | Status: DC
  Filled 2018-05-09: qty 21

## 2018-05-09 MED ORDER — METHYLPREDNISOLONE 4 MG PO TBPK: 4.0000 mg | ORAL_TABLET | ORAL | Status: DC

## 2018-05-09 MED ORDER — METHYLPREDNISOLONE 4 MG PO TABS: 8.0000 mg | ORAL_TABLET | Freq: Once | ORAL | Status: DC

## 2018-05-09 MED ORDER — METHYLPREDNISOLONE 4 MG PO TABS
8.0000 mg | ORAL_TABLET | Freq: Once | ORAL | Status: AC
  Administered 2018-05-09: 8 mg via ORAL
  Filled 2018-05-09: qty 2

## 2018-05-09 MED ORDER — METHYLPREDNISOLONE 4 MG PO TBPK: 4.0000 mg | ORAL_TABLET | Freq: Four times a day (QID) | ORAL | Status: DC

## 2018-05-09 MED ORDER — METHYLPREDNISOLONE 4 MG PO TABS
12.0000 mg | ORAL_TABLET | Freq: Once | ORAL | Status: AC
  Administered 2018-05-09: 12 mg via ORAL
  Filled 2018-05-09: qty 3

## 2018-05-09 MED ORDER — METHYLPREDNISOLONE 4 MG PO TABS
4.0000 mg | ORAL_TABLET | Freq: Three times a day (TID) | ORAL | Status: DC
  Administered 2018-05-10 (×2): 4 mg via ORAL
  Filled 2018-05-09 (×4): qty 1

## 2018-05-09 MED ORDER — METHYLPREDNISOLONE 4 MG PO TBPK: 4.0000 mg | ORAL_TABLET | Freq: Three times a day (TID) | ORAL | Status: DC

## 2018-05-09 MED FILL — Heparin Sodium (Porcine) Inj 1000 Unit/ML: INTRAMUSCULAR | Qty: 30 | Status: AC

## 2018-05-09 MED FILL — Sodium Chloride IV Soln 0.9%: INTRAVENOUS | Qty: 1000 | Status: AC

## 2018-05-09 NOTE — Progress Notes (Signed)
Occupational Therapy Treatment Patient Details Name: [name] MRN: [id number] DOB: 1975/11/29 Today's Date: 05/09/2018    History of present illness A 42 year old female with long-standing history with her back previous undergone L3-L5 fusion. s/p  PLIF 12/4.  PMH: anxiety, fibromyalgia,  multiple back surgeries   OT comments  Patient supine in bed and agreeable to OT, after receiving pain medication.  Patient requires cueing to adhere to spinal precautions during mobility and self care tasks, requires increased time and effort for all mobility.  Patient educated on AE (toileting aide/scrubbing bubbles wand) for toileting, and verbally reviewed AE for LB self care. Supervision for toileting, min guard for toilet transfers.  Bed mobility with supervision, removing rails and flattening bed upon return to supine to simulate home setup.  Continues to be limited by pain, anxiety.  Max encouragement, and will benefit from continued OT services while admitted to address mobility, precautions, review AE and increase safety/independence.    Follow Up Recommendations  Home health OT;Supervision - Intermittent    Equipment Recommendations  3 in 1 bedside commode;Other (comment)(RW)    Recommendations for Other Services      Precautions / Restrictions Precautions Precautions: Fall;Back Precaution Booklet Issued: No Precaution Comments: Reviewed back precautions Required Braces or Orthoses: Spinal Brace Spinal Brace: (pt declined use of brace for trip to bathroom ) Restrictions Weight Bearing Restrictions: No       Mobility Bed Mobility Overal bed mobility: Needs Assistance Bed Mobility: Rolling;Sidelying to Sit;Sit to Sidelying Rolling: Supervision Sidelying to sit: Supervision     Sit to sidelying: Supervision General bed mobility comments: HOB 20 degrees with sidelying to sit; flattened bed and removed rails to simulate home setup returning to supine with supervision    Transfers Overall transfer level: Needs assistance Equipment used: Rolling walker (2 wheeled) Transfers: Sit to/from Stand Sit to Stand: Min guard         General transfer comment: cueing for hand placement and safety, patient placing B UEs on walker     Balance Overall balance assessment: Needs assistance Sitting-balance support: No upper extremity supported;Feet supported Sitting balance-Leahy Scale: Poor Sitting balance - Comments: Prefers UE support due to pain sitting EOB.   Standing balance support: During functional activity;Single extremity supported Standing balance-Leahy Scale: Poor Standing balance comment: relaint on at least 1 UE support during self care tasks                            ADL either performed or assessed with clinical judgement   ADL Overall ADL's : Needs assistance/impaired     Grooming: Min guard;Standing;Wash/dry Electrical engineer Transfer: Min guard;Ambulation;RW;Grab bars;BSC;Comfort height toilet Toilet Transfer Details (indicate cue type and reason): 3n1 over the commode Toileting- Clothing Manipulation and Hygiene: Supervision/safety;Sit to/from stand Toileting - Clothing Manipulation Details (indicate cue type and reason): reviewed techniques and AE for toileting as patient concerned with hygiene after BM      Functional mobility during ADLs: Min guard;Rolling walker General ADL Comments: continued educated on safety, precautions, compensatory techniques; limited by pain     Vision       Perception     Praxis      Cognition Arousal/Alertness: Awake/alert Behavior During Therapy: Anxious Overall Cognitive Status: Within Functional Limits for tasks assessed  General Comments: anxious throughout session        Exercises     Shoulder Instructions       General Comments      Pertinent Vitals/ Pain       Pain Assessment: 0-10 Pain Score: 9   Pain Location: groin, back incision Pain Descriptors / Indicators: Burning;Discomfort;Guarding Pain Intervention(s): Limited activity within patient's tolerance;Repositioned;Premedicated before session  Home Living                                          Prior Functioning/Environment              Frequency  Min 3X/week        Progress Toward Goals  OT Goals(current goals can now be found in the care plan section)  Progress towards OT goals: Progressing toward goals  Acute Rehab OT Goals Patient Stated Goal: to go home OT Goal Formulation: With patient Time For Goal Achievement: 05/19/18 Potential to Achieve Goals: Good  Plan Discharge plan remains appropriate;Frequency remains appropriate    Co-evaluation                 AM-PAC OT "6 Clicks" Daily Activity     Outcome Measure   Help from another person eating meals?: None Help from another person taking care of personal grooming?: A Little Help from another person toileting, which includes using toliet, bedpan, or urinal?: A Little Help from another person bathing (including washing, rinsing, drying)?: A Little Help from another person to put on and taking off regular upper body clothing?: A Little Help from another person to put on and taking off regular lower body clothing?: A Lot 6 Click Score: 18    End of Session Equipment Utilized During Treatment: Gait belt;Back brace;Rolling walker  OT Visit Diagnosis: Other abnormalities of gait and mobility (R26.89);Muscle weakness (generalized) (M62.81);Pain Pain - part of body: (back)   Activity Tolerance Patient tolerated treatment well;Patient limited by pain   Patient Left in bed;with call bell/phone within reach;with family/visitor present   Nurse Communication Mobility status        Time: [id number] OT Time Calculation (min): 26 min  Charges: OT General Charges $OT Visit: 1 Visit OT Treatments $Self Care/Home Management :  23-37 mins  Chancy Milroyhristie S Ivi Griffith, OT Acute Rehabilitation Services Pager [phone] Office [phone]     Chancy MilroyChristie S Mikaeel Petrow 05/09/2018, 4:44 PM

## 2018-05-09 NOTE — Progress Notes (Signed)
OT Cancellation Note  Patient Details Name: [name] MRN: [id number] DOB: 05/19/1976   Cancelled Treatment:    Reason Eval/Treat Not Completed: Patient declined, no reason specified, patient reports just receiving pain medication and unable to participate in OT at this time d/t pain.  Will re-attempt as able.   Chancy Milroyhristie S Alga Southall, OT Acute Rehabilitation Services Pager [phone] Office [phone]   Chancy MilroyChristie S Sriya Kroeze 05/09/2018, 10:37 AM

## 2018-05-09 NOTE — Progress Notes (Signed)
Physical Therapy Treatment Patient Details Name: [name] MRN: [id number] DOB: 1975/09/21 Today's Date: 05/09/2018    History of Present Illness A 42 year old female with long-standing history with her back previous undergone L3-L5 fusion. s/p  PLIF 12/4.  PMH: anxiety, fibromyalgia,  multiple back surgeries    PT Comments    Pt pleasant and very willing to attempt gait and stairs this session. Pt with no physical assist to exit bed and get to toilet. PT began complaining of increased pain outside of room door despite denial to check on meds prior to session. PT able to increase gait distance although with slow speed and encouragement limited by incisional burning and anxiety. Pt will require assist to enter stairs at home but after practicing she states she will have assist of spouse and walker for home. Will continue to follow, Educated for sitting limitations and encouraged gait throughout the day.     Follow Up Recommendations  Home health PT;Supervision/Assistance - 24 hour     Equipment Recommendations  None recommended by PT    Recommendations for Other Services       Precautions / Restrictions Precautions Precautions: Fall;Back Precaution Comments: Reviewed back precautions Required Braces or Orthoses: Spinal Brace Spinal Brace: Applied in sitting position;Lumbar corset    Mobility  Bed Mobility Overal bed mobility: Needs Assistance Bed Mobility: Rolling;Sidelying to Sit Rolling: Supervision Sidelying to sit: Supervision       General bed mobility comments: HOB 20 degrees with reliance on rail to roll and rise from side.   Transfers Overall transfer level: Needs assistance   Transfers: Sit to/from Stand Sit to Stand: Min guard         General transfer comment: cues for hand placement as pt pushing up on RW to stand from bed, use of rail to stand from toilet, increased time  Ambulation/Gait Ambulation/Gait assistance: Min guard Gait Distance (Feet):  120 Feet Assistive device: Rolling walker (2 wheeled) Gait Pattern/deviations: Step-to pattern;Decreased stride length;Trunk flexed   Gait velocity interpretation: <1.31 ft/sec, indicative of household ambulator General Gait Details: pt with flexed hips throughout with cues to extend hips and trunk as well as increase stride. pt walked from room to stairs and despite burning pain refused to have seated rest in chair as she did not want to have to stand again. Slow gait with very short strides   Stairs Stairs: Yes Stairs assistance: Min assist Stair Management: One rail Left;Forwards Number of Stairs: 1 General stair comments: pt ascended with RLE with RUE HHA and left rail. PT with assist to lift RLE onto step. pt states stairs at home are separated by landing and she will use RW to step up the steps, Denied attempting backward with shoulders support of therapist   Wheelchair Mobility    Modified Rankin (Stroke Patients Only)       Balance                                            Cognition Arousal/Alertness: Awake/alert Behavior During Therapy: Anxious Overall Cognitive Status: Within Functional Limits for tasks assessed                                 General Comments: anxious, fixated on burning and that she can't fully stand      Exercises  General Comments        Pertinent Vitals/Pain Pain Score: 9  Pain Location: groin, back incision Pain Descriptors / Indicators: Burning Pain Intervention(s): Limited activity within patient's tolerance;Repositioned;Monitored during session;Patient requesting pain meds-RN notified;Premedicated before session;Ice applied    Home Living                      Prior Function            PT Goals (current goals can now be found in the care plan section) Progress towards PT goals: Progressing toward goals    Frequency    Min 5X/week      PT Plan Current plan remains  appropriate    Co-evaluation              AM-PAC PT "6 Clicks" Mobility   Outcome Measure  Help needed turning from your back to your side while in a flat bed without using bedrails?: A Little Help needed moving from lying on your back to sitting on the side of a flat bed without using bedrails?: A Little Help needed moving to and from a bed to a chair (including a wheelchair)?: A Little Help needed standing up from a chair using your arms (e.g., wheelchair or bedside chair)?: A Little Help needed to walk in hospital room?: A Little Help needed climbing 3-5 steps with a railing? : A Little 6 Click Score: 18    End of Session Equipment Utilized During Treatment: Back brace Activity Tolerance: Patient limited by pain;Patient limited by fatigue Patient left: in chair;with call bell/phone within reach;with family/visitor present Nurse Communication: Mobility status;Patient requests pain meds PT Visit Diagnosis: Unsteadiness on feet (R26.81);Muscle weakness (generalized) (M62.81);Pain     Time: [id number] PT Time Calculation (min) (ACUTE ONLY): 40 min  Charges:  $Gait Training: 23-37 mins $Therapeutic Activity: 8-22 mins                     Mallie Linnemann Abner Greenspanabor Sashia [name], PT Acute Rehabilitation Services Pager: [phone] Office: [phone]    Enedina FinnerMaija B Antwuan Eckley 05/09/2018, 10:57 AM

## 2018-05-09 NOTE — Progress Notes (Signed)
Subjective: Patient reports slight increased back and leg pain today  Objective: Vital signs in last 24 hours: Temp:  [97.6 F (36.4 C)-98.8 F (37.1 C)] 97.8 F (36.6 C) (12/09 1157) Pulse Rate:  [66-96] 96 (12/09 1157) Resp:  [16-18] 16 (12/08 1938) BP: (105-152)/(68-89) 115/75 (12/09 1157) SpO2:  [97 %-99 %] 98 % (12/09 0900)  Intake/Output from previous day: 12/08 0701 - 12/09 0700 In: 350 [P.O.:300; IV Piggyback:50] Out: -  Intake/Output this shift: Total I/O In: 360 [P.O.:360] Out: -   awake alert strength 5 out of 5 wound is clean dry and intact  Lab Results: No results for input(s): WBC, HGB, HCT, PLT in the last 72 hours. BMET No results for input(s): NA, K, CL, CO2, GLUCOSE, BUN, CREATININE, CALCIUM in the last 72 hours.  Studies/Results: No results found.  Assessment/Plan: Will change of her muscle relaxer had a steroid pack and progressively mobilizing  LOS: 5 days     Victoriano Campion P 05/09/2018, 1:26 PM

## 2018-05-10 MED ORDER — HYDROMORPHONE HCL 2 MG PO TABS: 1.0000 mg | ORAL_TABLET | Freq: Four times a day (QID) | ORAL | 0 refills | Status: DC | PRN

## 2018-05-10 MED ORDER — OXYCODONE-ACETAMINOPHEN 10-325 MG PO TABS: 1.0000 | ORAL_TABLET | ORAL | 0 refills | Status: DC | PRN

## 2018-05-10 MED ORDER — METHOCARBAMOL 750 MG PO TABS: 750.0000 mg | ORAL_TABLET | Freq: Four times a day (QID) | ORAL | 0 refills | Status: DC | PRN

## 2018-05-10 MED ORDER — HYDROMORPHONE HCL 2 MG PO TABS
2.0000 mg | ORAL_TABLET | ORAL | Status: AC
  Administered 2018-05-10: 2 mg via ORAL
  Filled 2018-05-10: qty 1

## 2018-05-10 NOTE — Progress Notes (Signed)
Occupational Therapy Treatment Patient Details Name: [name] MRN: [id number] DOB: 08/04/1975 Today's Date: 05/10/2018    History of present illness A [age] year old female with long-standing history with her back previous undergone L3-L5 fusion. s/p  PLIF 12/4.  PMH: anxiety, fibromyalgia,  multiple back surgeries   OT comments  Pt progressing towards established OT goals. Pt performing dressing with supervision for safety demonstrating good adherence to back precautions. Pt requiring to take several rest breaks due to pain. Educating pt on AE for toilet hygiene after BM. Pt verbalized understanding. Pt performing toilet transfer with supervision. Continue to recommend dc home with HHOT. Pt planning to dc later today and answered all questions in preparation.    Follow Up Recommendations  Home health OT;Supervision - Intermittent    Equipment Recommendations  3 in 1 bedside commode;Other (comment)(RW)    Recommendations for Other Services      Precautions / Restrictions Precautions Precautions: Fall;Back Precaution Comments: Reviewed back precautions Required Braces or Orthoses: Spinal Brace Spinal Brace: Applied in sitting position       Mobility Bed Mobility               General bed mobility comments: OOB upon arrival on toielt upon arrival  Transfers Overall transfer level: Needs assistance Equipment used: Rolling walker (2 wheeled) Transfers: Sit to/from Stand Sit to Stand: Supervision         General transfer comment: cueing for hand placement and safety    Balance Overall balance assessment: Needs assistance Sitting-balance support: No upper extremity supported;Feet supported Sitting balance-Leahy Scale: Good     Standing balance support: No upper extremity supported;During functional activity Standing balance-Leahy Scale: Fair Standing balance comment: Able to maintain standing without UE support                           ADL  either performed or assessed with clinical judgement   ADL Overall ADL's : Needs assistance/impaired                 Upper Body Dressing : Supervision/safety;Set up;Sitting Upper Body Dressing Details (indicate cue type and reason): donned shirt and brace with supervision Lower Body Dressing: Min guard;Sit to/from stand Lower Body Dressing Details (indicate cue type and reason): Min Guard A for safety. Pt able to drop underwear and pants down without bending forward to don; no use of AE. Toilet Transfer: Supervision/safety;Ambulation;RW;BSC     Toileting - Clothing Manipulation Details (indicate cue type and reason): Education pt on use of ae for hygiene after BM     Functional mobility during ADLs: Supervision/safety;Rolling walker General ADL Comments: Pt performing dressing at supervision-min guard A level.      Vision       Perception     Praxis      Cognition Arousal/Alertness: Awake/alert Behavior During Therapy: Anxious Overall Cognitive Status: Within Functional Limits for tasks assessed                                 General Comments: pt became anxious with pain last 50' of gait.         Exercises     Shoulder Instructions       General Comments Mother present throughout    Pertinent Vitals/ Pain       Pain Assessment: 0-10 Faces Pain Scale: Hurts even more Pain Location: left hip, incision, left buttock Pain  Descriptors / Indicators: Burning;Discomfort;Guarding;Crying Pain Intervention(s): Monitored during session;Limited activity within patient's tolerance;Repositioned  Home Living                                          Prior Functioning/Environment              Frequency  Min 3X/week        Progress Toward Goals  OT Goals(current goals can now be found in the care plan section)  Progress towards OT goals: Progressing toward goals  Acute Rehab OT Goals Patient Stated Goal: to go home OT Goal  Formulation: With patient Time For Goal Achievement: 05/19/18 Potential to Achieve Goals: Good ADL Goals Pt Will Perform Lower Body Bathing: with adaptive equipment;sit to/from stand;with modified independence Pt Will Perform Lower Body Dressing: with modified independence;sit to/from stand;with adaptive equipment Pt Will Transfer to Toilet: with modified independence;ambulating;bedside commode Pt Will Perform Toileting - Clothing Manipulation and hygiene: with modified independence;sit to/from stand;sitting/lateral leans;with adaptive equipment Additional ADL Goal #1: Pt will independently verbalize 3 back precautions for ADL  Plan Discharge plan remains appropriate    Co-evaluation                 AM-PAC OT "6 Clicks" Daily Activity     Outcome Measure   Help from another person eating meals?: None Help from another person taking care of personal grooming?: A Little Help from another person toileting, which includes using toliet, bedpan, or urinal?: A Little Help from another person bathing (including washing, rinsing, drying)?: A Little Help from another person to put on and taking off regular upper body clothing?: A Little Help from another person to put on and taking off regular lower body clothing?: A Little 6 Click Score: 19    End of Session Equipment Utilized During Treatment: Rolling walker;Back brace  OT Visit Diagnosis: Other abnormalities of gait and mobility (R26.89);Muscle weakness (generalized) (M62.81);Pain   Activity Tolerance Patient tolerated treatment well   Patient Left in chair;with call bell/phone within reach;with family/visitor present   Nurse Communication Mobility status        Time: 4098-1191 OT Time Calculation (min): 28 min  Charges: OT General Charges $OT Visit: 1 Visit OT Treatments $Self Care/Home Management : 23-37 mins  Samarah Hogle MSOT, OTR/L Acute Rehab Pager: [phone] Office: [phone]   Theodoro Grist  Brynnan Rodenbaugh 05/10/2018, 5:30 PM

## 2018-05-10 NOTE — Progress Notes (Signed)
Physical Therapy Treatment Patient Details Name: [name] MRN: [id number] DOB: 05/31/1976 Today's Date: 05/10/2018    History of Present Illness A 42 year old female with long-standing history with her back previous undergone L3-L5 fusion. s/p  PLIF 12/4.  PMH: anxiety, fibromyalgia,  multiple back surgeries    PT Comments    Pt pleasant and willing to mobilize after premedication this am. She performed transfers with increased ease and initiated gait with increased speed. Pt reported after 100' increased burning in left hip and buttock limiting her gait and function. Pt with education for precautions, posture and brace wear. Pt continues to be apprehensive about returning home without IV pain meds.   Follow Up Recommendations  Home health PT;Supervision/Assistance - 24 hour     Equipment Recommendations  None recommended by PT    Recommendations for Other Services       Precautions / Restrictions Precautions Precautions: Fall;Back Precaution Comments: Reviewed back precautions Required Braces or Orthoses: Spinal Brace Spinal Brace: Applied in sitting position    Mobility  Bed Mobility Overal bed mobility: Needs Assistance Bed Mobility: Rolling;Sidelying to Sit Rolling: Supervision Sidelying to sit: Supervision       General bed mobility comments: HOb 20 degrees with no rail, cues for sequence and increased time. Pt has adjustable bed at home  Transfers Overall transfer level: Needs assistance   Transfers: Sit to/from Stand Sit to Stand: Supervision         General transfer comment: cueing for hand placement and safety  Ambulation/Gait Ambulation/Gait assistance: Min guard Gait Distance (Feet): 150 Feet Assistive device: Rolling walker (2 wheeled) Gait Pattern/deviations: Step-to pattern;Decreased stride length;Trunk flexed Gait velocity: grossly 8 min to complete 150' Gait velocity interpretation: <1.31 ft/sec, indicative of household  ambulator General Gait Details: pt with flexed hips throughout with cues to extend hips and trunk as well as increase stride.  Slow gait with very short strides   Stairs             Wheelchair Mobility    Modified Rankin (Stroke Patients Only)       Balance                                            Cognition Arousal/Alertness: Awake/alert Behavior During Therapy: Anxious Overall Cognitive Status: Within Functional Limits for tasks assessed                                 General Comments: pt became anxious with pain last 50' of gait.       Exercises      General Comments        Pertinent Vitals/Pain Pain Score: 6  Pain Location: left hip, incision, left buttock Pain Descriptors / Indicators: Burning;Discomfort;Guarding Pain Intervention(s): Limited activity within patient's tolerance;Repositioned;Monitored during session;Premedicated before session    Home Living                      Prior Function            PT Goals (current goals can now be found in the care plan section) Progress towards PT goals: Progressing toward goals    Frequency           PT Plan Current plan remains appropriate    Co-evaluation  AM-PAC PT "6 Clicks" Mobility   Outcome Measure  Help needed turning from your back to your side while in a flat bed without using bedrails?: A Little Help needed moving from lying on your back to sitting on the side of a flat bed without using bedrails?: A Little Help needed moving to and from a bed to a chair (including a wheelchair)?: A Little Help needed standing up from a chair using your arms (e.g., wheelchair or bedside chair)?: A Little Help needed to walk in hospital room?: A Little Help needed climbing 3-5 steps with a railing? : A Little 6 Click Score: 18    End of Session Equipment Utilized During Treatment: Back brace Activity Tolerance: Patient limited by  pain Patient left: in chair;with call bell/phone within reach;with family/visitor present Nurse Communication: Mobility status PT Visit Diagnosis: Unsteadiness on feet (R26.81);Muscle weakness (generalized) (M62.81);Pain     Time: 5621-3086 PT Time Calculation (min) (ACUTE ONLY): 33 min  Charges:  $Gait Training: 8-22 mins $Therapeutic Activity: 8-22 mins                     Caterin Tabares Abner Greenspan, PT Acute Rehabilitation Services Pager: [phone] Office: [phone]    Norita Meigs B Jamile Sivils 05/10/2018, 11:03 AM

## 2018-05-10 NOTE — Discharge Summary (Signed)
Physician Discharge Summary  Patient ID: [name] MRN: [id number] DOB/AGE: 1975-12-23 42 y.o.  Admit date: 05/04/2018 Discharge date: 05/10/2018  Admission Diagnoses: Degenerative disc disease lumbar spinal stenosis L5-S1     Discharge Diagnoses: same   Discharged Condition: good  Hospital Course: The patient was admitted on 05/04/2018 and taken to the operating room where the patient underwent alif L5-S1 with posterior fixation. The patient tolerated the procedure well and was taken to the recovery room and then to the floor in stable condition. The hospital course was routine. There were no complications. The wound remained clean dry and intact. Pt had appropriate back soreness and some leg pain. new N/T/W. The patient remained afebrile with stable vital signs, and tolerated a regular diet. The patient continued to increase activities, and pain was well controlled with oral pain medications.   Consults: None  Significant Diagnostic Studies:  Results for orders placed or performed during the hospital encounter of 04/26/18  Surgical pcr screen  Result Value Ref Range   MRSA, PCR NEGATIVE NEGATIVE   Staphylococcus aureus NEGATIVE NEGATIVE  Basic metabolic panel  Result Value Ref Range   Sodium 139 135 - 145 mmol/L   Potassium 4.2 3.5 - 5.1 mmol/L   Chloride 106 98 - 111 mmol/L   CO2 25 22 - 32 mmol/L   Glucose, Bld 102 (H) 70 - 99 mg/dL   BUN 15 6 - 20 mg/dL   Creatinine, Ser 7.56 0.44 - 1.00 mg/dL   Calcium 9.0 8.9 - 43.3 mg/dL   GFR calc non Af Amer NOT CALCULATED >60 mL/min   GFR calc Af Amer NOT CALCULATED >60 mL/min   Anion gap 8 5 - 15  CBC  Result Value Ref Range   WBC 12.2 (H) 4.0 - 10.5 K/uL   RBC 4.98 3.87 - 5.11 MIL/uL   Hemoglobin 14.6 12.0 - 15.0 g/dL   HCT 29.5 18.8 - 41.6 %   MCV 89.2 80.0 - 100.0 fL   MCH 29.3 26.0 - 34.0 pg   MCHC 32.9 30.0 - 36.0 g/dL   RDW 60.6 30.1 - 60.1 %   Platelets 314 150 - 400 K/uL   nRBC 0.0 0.0 - 0.2 %  Type and  screen  Result Value Ref Range   ABO/RH(D) O POS    Antibody Screen NEG    Sample Expiration 05/10/2018    Extend sample reason      NO TRANSFUSIONS OR PREGNANCY IN THE PAST 3 MONTHS Performed at [facility], [address] [place], [place] [address]     Dg Lumbar Spine Complete  Result Date: 05/04/2018 CLINICAL DATA:  Anterior lumbar interbody fusion EXAM: DG C-ARM 61-120 MIN; LUMBAR SPINE - COMPLETE 4+ VIEW COMPARISON:  CT from yesterday FINDINGS: Fluoroscopy shows L5-S1 anterior lumbar interbody fusion. Graft positioning is unremarkable. Screws are also newly seen posteriorly at L5-S1. IMPRESSION: Fluoroscopy for L5-S1 ALIF and posterior-lateral arthrodesis. Electronically Signed   By: Marnee Spring M.D.   On: 05/04/2018 14:49   Ct Lumbar Spine Wo Contrast  Result Date: 05/03/2018 CLINICAL DATA:  Preop for 05/04/2018.  Back pain with sciatica. EXAM: CT LUMBAR SPINE WITHOUT CONTRAST TECHNIQUE: Multidetector CT imaging of the lumbar spine was performed without intravenous contrast administration. Multiplanar CT image reconstructions were also generated. COMPARISON:  04/29/2017 lumbar MRI.  Lumbar spine CT 03/01/2017 FINDINGS: Segmentation: 5 lumbar type vertebral bodies Alignment: Normal Vertebrae: No acute fracture or focal pathologic process. L3-4 and L4-5 PLIF with solid arthrodesis. No  hardware failure. Paraspinal and other soft tissues: Negative Disc levels: T12- L1: Unremarkable L1-L2: Unremarkable. L2-L3: Mild facet spurring.  No evident impingement L3-L4: PLIF with solid arthrodesis.  No bony impingement L4-L5: PLIF with solid arthrodesis.  No bony impingement L5-S1:Disc narrowing and bulge with endplate spurring. Bilateral facet spurring. Right more than left foraminal narrowing with probable mass effect on the right L5 nerve root. IMPRESSION: 1. L5-S1 facet and endplate spurring encroaching on the right L5 foramen. 2. L3-4 and L4-5 PLIF with solid arthrodesis. Electronically  Signed   By: Marnee SpringJonathon  Watts M.D.   On: 05/03/2018 14:56   Dg C-arm 1-60 Min  Result Date: 05/04/2018 CLINICAL DATA:  Anterior lumbar interbody fusion EXAM: DG C-ARM 61-120 MIN; LUMBAR SPINE - COMPLETE 4+ VIEW COMPARISON:  CT from yesterday FINDINGS: Fluoroscopy shows L5-S1 anterior lumbar interbody fusion. Graft positioning is unremarkable. Screws are also newly seen posteriorly at L5-S1. IMPRESSION: Fluoroscopy for L5-S1 ALIF and posterior-lateral arthrodesis. Electronically Signed   By: Marnee SpringJonathon  Watts M.D.   On: 05/04/2018 14:49   Dg C-arm 1-60 Min  Result Date: 05/04/2018 CLINICAL DATA:  Anterior lumbar interbody fusion EXAM: DG C-ARM 61-120 MIN; LUMBAR SPINE - COMPLETE 4+ VIEW COMPARISON:  CT from yesterday FINDINGS: Fluoroscopy shows L5-S1 anterior lumbar interbody fusion. Graft positioning is unremarkable. Screws are also newly seen posteriorly at L5-S1. IMPRESSION: Fluoroscopy for L5-S1 ALIF and posterior-lateral arthrodesis. Electronically Signed   By: Marnee SpringJonathon  Watts M.D.   On: 05/04/2018 14:49   Dg Or Local Abdomen  Result Date: 05/04/2018 CLINICAL DATA:  Instrument count. EXAM: OR LOCAL ABDOMEN COMPARISON:  None. FINDINGS: Single portable radiograph obtained in the operating room was submitted. No evidence for suture needle or retained sponge. Spinal hardware from posterior rod fixation L3 through L5 identified. There has been interval anterior hardware fixation at L5-S1. IMPRESSION: No retained sponge or suture needles identified. Electronically Signed   By: Signa Kellaylor  Stroud M.D.   On: 05/04/2018 11:31    Antibiotics:  Anti-infectives (From admission, onward)   Start     Dose/Rate Route Frequency Ordered Stop   05/04/18 1900  ceFAZolin (ANCEF) IVPB 2g/100 mL premix     2 g 200 mL/hr over 30 Minutes Intravenous Every 8 hours 05/04/18 1753 05/06/18 1109   05/04/18 1243  vancomycin (VANCOCIN) powder  Status:  Discontinued       As needed 05/04/18 1246 05/04/18 1422   05/04/18 0920   bacitracin 50,000 Units in sodium chloride 0.9 % 500 mL irrigation  Status:  Discontinued       As needed 05/04/18 0920 05/04/18 1422   05/04/18 0400  ceFAZolin (ANCEF) 3 g in dextrose 5 % 50 mL IVPB     3 g 100 mL/hr over 30 Minutes Intravenous On call to O.R. 05/03/18 1329 05/04/18 1128      Discharge Exam: Blood pressure 126/80, pulse 86, temperature 97.9 F (36.6 C), temperature source Oral, resp. rate 17, height 5\' 9"  (1.753 m), weight 124.6 kg, last menstrual period 12/13/1999, SpO2 98 %. Neurologic: Grossly normal Ambulating and voiding  Discharge Medications:   Allergies as of 05/10/2018      Reactions   Gadolinium Derivatives Swelling, Other (See Comments)   Arm swelling, tingling, & redness. Radiologist Dr. Grace IsaacWatts recommends an injection of steroids, or 13-hour prep if non-emergent, if patient needs a gadolinium based contrast in the future.   Other Rash, Other (See Comments)   Glue or tape to seal up wound   Zoloft [sertraline Hcl] Other (See Comments)  Hair loss      Medication List    TAKE these medications   ALPRAZolam 0.5 MG tablet Commonly known as:  XANAX TAKE 1 TABLET BY MOUTH THREE TIMES A DAY AS NEEDED What changed:  reasons to take this   calcium carbonate 500 MG chewable tablet Commonly known as:  TUMS - dosed in mg elemental calcium Chew 1 tablet by mouth 2 (two) times daily as needed for indigestion or heartburn.   cetirizine 10 MG tablet Commonly known as:  ZYRTEC Take 10 mg by mouth daily.   cyanocobalamin 1000 MCG/ML injection Commonly known as:  (VITAMIN B-12) INJECT 1 ML (1,000 MCG TOTAL) INTO THE MUSCLE EVERY 7 DAYS. What changed:  See the new instructions.   fluticasone 50 MCG/ACT nasal spray Commonly known as:  FLONASE Place 2 sprays into both nostrils daily.   gabapentin 300 MG capsule Commonly known as:  NEURONTIN Take 1 capsule (300 mg total) by mouth 3 (three) times daily. What changed:  when to take this   hydrochlorothiazide  25 MG tablet Commonly known as:  HYDRODIURIL TAKE 1 TABLET BY MOUTH EVERY DAY   HYDROmorphone 2 MG tablet Commonly known as:  DILAUDID Take 0.5 tablets (1 mg total) by mouth every 6 (six) hours as needed for severe pain.   methocarbamol 750 MG tablet Commonly known as:  ROBAXIN Take 1 tablet (750 mg total) by mouth every 6 (six) hours as needed for muscle spasms.   MULTI-VITAMIN GUMMIES PO Take 2 tablets by mouth daily.   nitrofurantoin (macrocrystal-monohydrate) 100 MG capsule Commonly known as:  MACROBID Take 100 mg by mouth daily as needed (for uti prophylaxis, after intercourse).   oxyCODONE 15 MG immediate release tablet Commonly known as:  ROXICODONE Take 1 tablet (15 mg total) by mouth every 4 (four) hours as needed for pain.   oxyCODONE-acetaminophen 10-325 MG tablet Commonly known as:  PERCOCET Take 1 tablet by mouth every 4 (four) hours as needed for pain.   pantoprazole 40 MG tablet Commonly known as:  PROTONIX TAKE 1 TABLET BY MOUTH TWICE A DAY **NEED PA FOR BID** What changed:  See the new instructions.   potassium chloride 10 MEQ tablet Commonly known as:  K-DUR Take 1 tablet (10 mEq total) by mouth daily. What changed:    when to take this  reasons to take this   promethazine 25 MG tablet Commonly known as:  PHENERGAN TAKE 1 TABLET BY MOUTH EVERY 6 HOURS AS NEEDED FOR NAUSEA What changed:    how much to take  how to take this  when to take this  additional instructions   simethicone 80 MG chewable tablet Commonly known as:  MYLICON Chew 160 mg by mouth every 6 (six) hours as needed (gas pain).   SUMAtriptan 100 MG tablet Commonly known as:  IMITREX Take 1 tablet earliest onset of migraine.  May repeat once in 2 hours if headache persists or recurs.   SYRINGE-NEEDLE (DISP) 3 ML 25G X 1" 3 ML Misc USE AS DIRECTED   SYRINGE-NEEDLE (DISP) 3 ML 25G X 1" 3 ML Misc USE AS DIRECTED   Vitamin D (Ergocalciferol) 1.25 MG (50000 UT) Caps  capsule Commonly known as:  DRISDOL TAKE 1 CAPSULE (50,000 UNITS TOTAL) BY MOUTH EVERY 7 (SEVEN) DAYS.            Durable Medical Equipment  (From admission, onward)         Start     Ordered   05/10/18 [phone]  DME 3-in-1  Once     05/10/18 0848          Disposition: home   Final Dx: alif L5-S1 with posterior fixation  Discharge Instructions    Call MD for:  difficulty breathing, headache or visual disturbances   Complete by:  As directed    Call MD for:  hives   Complete by:  As directed    Call MD for:  persistant dizziness or light-headedness   Complete by:  As directed    Call MD for:  persistant nausea and vomiting   Complete by:  As directed    Call MD for:  redness, tenderness, or signs of infection (pain, swelling, redness, odor or green/yellow discharge around incision site)   Complete by:  As directed    Call MD for:  severe uncontrolled pain   Complete by:  As directed    Call MD for:  temperature >100.4   Complete by:  As directed    Diet - low sodium heart healthy   Complete by:  As directed    Driving Restrictions   Complete by:  As directed    No driving for 2 weeks, no riding in the car for 1 week   Increase activity slowly   Complete by:  As directed    Lifting restrictions   Complete by:  As directed    No lifting more than 8 lbs   Remove dressing in 24 hours   Complete by:  As directed          Signed: Tiana Loft Marcedes Tech 05/10/2018, 8:50 AM

## 2018-05-10 NOTE — Progress Notes (Signed)
Pt being discharged home via wheelchair with family. Pt alert and oriented x4. VSS. Pt c/o no pain at this time. No signs of respiratory distress. Education complete and care plans resolved. IV removed with catheter intact and pt tolerated well. No further issues at this time. Pt to follow up with PCP. Zuhayr Deeney R, RN 

## 2018-05-10 NOTE — Care Management Note (Signed)
Case Management Note  Patient Details  Name: Jory EeMyranda P Carias MRN: [id number] Date of Birth: 11/02/1975  Subjective/Objective:  Pt admitted on 05/04/18 s/p anterior lumbar interbody fusion L5-S1 with screw placement.  PTA, pt needs assistance with ADLS; lives with spouse.                   Action/Plan: PT/OT recommending HH follow up, and pt agreeable to services.  [facility], per pt choice[facility]  [facility] and 3 in 1 BSC, to be delivered to room prior to dc home.    Expected Discharge Date:  05/10/18               Expected Discharge Plan:  Home w Home Health Services  In-House Referral:     Discharge planning Services  CM Consult  Post Acute Care Choice:  Home Health Choice offered to:  Patient  DME Arranged:  3-N-1, Walker rolling DME Agency:  [facility]  [facility]  [facility]  Status of Service:  Completed, signed off  If discussed at Long Length of Stay Meetings, dates discussed:    Additional Comments:  Quintella BatonJulie W. Tanija Germani, RN, BSN  Trauma/Neuro ICU Case Manager [phone]

## 2018-05-17 DIAGNOSIS — K5900 Constipation, unspecified: Secondary | ICD-10-CM | POA: Diagnosis not present

## 2018-05-17 DIAGNOSIS — E538 Deficiency of other specified B group vitamins: Secondary | ICD-10-CM | POA: Diagnosis not present

## 2018-05-17 DIAGNOSIS — F329 Major depressive disorder, single episode, unspecified: Secondary | ICD-10-CM | POA: Diagnosis not present

## 2018-05-17 DIAGNOSIS — Z87891 Personal history of nicotine dependence: Secondary | ICD-10-CM | POA: Diagnosis not present

## 2018-05-17 DIAGNOSIS — M170 Bilateral primary osteoarthritis of knee: Secondary | ICD-10-CM | POA: Diagnosis not present

## 2018-05-17 DIAGNOSIS — Z9181 History of falling: Secondary | ICD-10-CM | POA: Diagnosis not present

## 2018-05-17 DIAGNOSIS — G43709 Chronic migraine without aura, not intractable, without status migrainosus: Secondary | ICD-10-CM | POA: Diagnosis not present

## 2018-05-17 DIAGNOSIS — M48061 Spinal stenosis, lumbar region without neurogenic claudication: Secondary | ICD-10-CM | POA: Diagnosis not present

## 2018-05-17 DIAGNOSIS — G629 Polyneuropathy, unspecified: Secondary | ICD-10-CM | POA: Diagnosis not present

## 2018-05-17 DIAGNOSIS — M797 Fibromyalgia: Secondary | ICD-10-CM | POA: Diagnosis not present

## 2018-05-17 DIAGNOSIS — I10 Essential (primary) hypertension: Secondary | ICD-10-CM | POA: Diagnosis not present

## 2018-05-17 DIAGNOSIS — F411 Generalized anxiety disorder: Secondary | ICD-10-CM | POA: Diagnosis not present

## 2018-05-18 ENCOUNTER — Ambulatory Visit: Payer: BLUE CROSS/BLUE SHIELD | Admitting: Licensed Clinical Social Worker

## 2018-05-31 DIAGNOSIS — M797 Fibromyalgia: Secondary | ICD-10-CM | POA: Diagnosis not present

## 2018-05-31 DIAGNOSIS — F329 Major depressive disorder, single episode, unspecified: Secondary | ICD-10-CM | POA: Diagnosis not present

## 2018-05-31 DIAGNOSIS — G629 Polyneuropathy, unspecified: Secondary | ICD-10-CM | POA: Diagnosis not present

## 2018-05-31 DIAGNOSIS — Z87891 Personal history of nicotine dependence: Secondary | ICD-10-CM | POA: Diagnosis not present

## 2018-05-31 DIAGNOSIS — E538 Deficiency of other specified B group vitamins: Secondary | ICD-10-CM | POA: Diagnosis not present

## 2018-05-31 DIAGNOSIS — K5900 Constipation, unspecified: Secondary | ICD-10-CM | POA: Diagnosis not present

## 2018-05-31 DIAGNOSIS — G43709 Chronic migraine without aura, not intractable, without status migrainosus: Secondary | ICD-10-CM | POA: Diagnosis not present

## 2018-05-31 DIAGNOSIS — Z9181 History of falling: Secondary | ICD-10-CM | POA: Diagnosis not present

## 2018-05-31 DIAGNOSIS — I10 Essential (primary) hypertension: Secondary | ICD-10-CM | POA: Diagnosis not present

## 2018-05-31 DIAGNOSIS — M48061 Spinal stenosis, lumbar region without neurogenic claudication: Secondary | ICD-10-CM | POA: Diagnosis not present

## 2018-05-31 DIAGNOSIS — F411 Generalized anxiety disorder: Secondary | ICD-10-CM | POA: Diagnosis not present

## 2018-05-31 DIAGNOSIS — M170 Bilateral primary osteoarthritis of knee: Secondary | ICD-10-CM | POA: Diagnosis not present

## 2018-06-02 DIAGNOSIS — K5900 Constipation, unspecified: Secondary | ICD-10-CM

## 2018-06-02 DIAGNOSIS — M170 Bilateral primary osteoarthritis of knee: Secondary | ICD-10-CM | POA: Diagnosis not present

## 2018-06-02 DIAGNOSIS — Z87891 Personal history of nicotine dependence: Secondary | ICD-10-CM

## 2018-06-02 DIAGNOSIS — M48061 Spinal stenosis, lumbar region without neurogenic claudication: Secondary | ICD-10-CM | POA: Diagnosis not present

## 2018-06-02 DIAGNOSIS — F329 Major depressive disorder, single episode, unspecified: Secondary | ICD-10-CM

## 2018-06-02 DIAGNOSIS — G43709 Chronic migraine without aura, not intractable, without status migrainosus: Secondary | ICD-10-CM

## 2018-06-02 DIAGNOSIS — M797 Fibromyalgia: Secondary | ICD-10-CM | POA: Diagnosis not present

## 2018-06-02 DIAGNOSIS — Z9181 History of falling: Secondary | ICD-10-CM

## 2018-06-02 DIAGNOSIS — I10 Essential (primary) hypertension: Secondary | ICD-10-CM

## 2018-06-02 DIAGNOSIS — E538 Deficiency of other specified B group vitamins: Secondary | ICD-10-CM

## 2018-06-02 DIAGNOSIS — F411 Generalized anxiety disorder: Secondary | ICD-10-CM

## 2018-06-02 DIAGNOSIS — G629 Polyneuropathy, unspecified: Secondary | ICD-10-CM | POA: Diagnosis not present

## 2018-06-03 ENCOUNTER — Encounter: Payer: Self-pay | Admitting: Family Medicine

## 2018-06-03 NOTE — Telephone Encounter (Signed)
I could do this but it would need to be part of a PMV. She is actually due for one anyway. Set up a PMV for next week

## 2018-06-03 NOTE — Telephone Encounter (Signed)
Dr. Fry please advise. Thanks  

## 2018-06-06 ENCOUNTER — Emergency Department (HOSPITAL_BASED_OUTPATIENT_CLINIC_OR_DEPARTMENT_OTHER): Payer: BLUE CROSS/BLUE SHIELD

## 2018-06-06 ENCOUNTER — Other Ambulatory Visit: Payer: Self-pay

## 2018-06-06 ENCOUNTER — Encounter (HOSPITAL_BASED_OUTPATIENT_CLINIC_OR_DEPARTMENT_OTHER): Payer: Self-pay

## 2018-06-06 ENCOUNTER — Ambulatory Visit: Payer: Self-pay

## 2018-06-06 ENCOUNTER — Observation Stay (HOSPITAL_BASED_OUTPATIENT_CLINIC_OR_DEPARTMENT_OTHER)
Admission: EM | Admit: 2018-06-06 | Discharge: 2018-06-08 | Disposition: A | Discharge status: Home / Self Care | Payer: BLUE CROSS/BLUE SHIELD | Attending: Internal Medicine | Admitting: Internal Medicine

## 2018-06-06 DIAGNOSIS — I10 Essential (primary) hypertension: Secondary | ICD-10-CM | POA: Diagnosis not present

## 2018-06-06 DIAGNOSIS — K219 Gastro-esophageal reflux disease without esophagitis: Secondary | ICD-10-CM | POA: Diagnosis present

## 2018-06-06 DIAGNOSIS — R0602 Shortness of breath: Secondary | ICD-10-CM | POA: Diagnosis not present

## 2018-06-06 DIAGNOSIS — K5900 Constipation, unspecified: Secondary | ICD-10-CM | POA: Diagnosis not present

## 2018-06-06 DIAGNOSIS — I2694 Multiple subsegmental pulmonary emboli without acute cor pulmonale: Principal | ICD-10-CM | POA: Insufficient documentation

## 2018-06-06 DIAGNOSIS — M79605 Pain in left leg: Secondary | ICD-10-CM | POA: Insufficient documentation

## 2018-06-06 DIAGNOSIS — R000 Tachycardia, unspecified: Secondary | ICD-10-CM | POA: Diagnosis not present

## 2018-06-06 DIAGNOSIS — K760 Fatty (change of) liver, not elsewhere classified: Secondary | ICD-10-CM | POA: Insufficient documentation

## 2018-06-06 DIAGNOSIS — G43909 Migraine, unspecified, not intractable, without status migrainosus: Secondary | ICD-10-CM | POA: Diagnosis not present

## 2018-06-06 DIAGNOSIS — J9601 Acute respiratory failure with hypoxia: Secondary | ICD-10-CM | POA: Insufficient documentation

## 2018-06-06 DIAGNOSIS — Z6841 Body Mass Index (BMI) 40.0 and over, adult: Secondary | ICD-10-CM | POA: Diagnosis not present

## 2018-06-06 DIAGNOSIS — E538 Deficiency of other specified B group vitamins: Secondary | ICD-10-CM | POA: Diagnosis not present

## 2018-06-06 DIAGNOSIS — Z9049 Acquired absence of other specified parts of digestive tract: Secondary | ICD-10-CM | POA: Insufficient documentation

## 2018-06-06 DIAGNOSIS — M797 Fibromyalgia: Secondary | ICD-10-CM | POA: Diagnosis not present

## 2018-06-06 DIAGNOSIS — Z87442 Personal history of urinary calculi: Secondary | ICD-10-CM | POA: Diagnosis not present

## 2018-06-06 DIAGNOSIS — Z87891 Personal history of nicotine dependence: Secondary | ICD-10-CM | POA: Insufficient documentation

## 2018-06-06 DIAGNOSIS — M79604 Pain in right leg: Secondary | ICD-10-CM | POA: Insufficient documentation

## 2018-06-06 DIAGNOSIS — Z981 Arthrodesis status: Secondary | ICD-10-CM | POA: Insufficient documentation

## 2018-06-06 DIAGNOSIS — E559 Vitamin D deficiency, unspecified: Secondary | ICD-10-CM | POA: Diagnosis not present

## 2018-06-06 DIAGNOSIS — E785 Hyperlipidemia, unspecified: Secondary | ICD-10-CM | POA: Insufficient documentation

## 2018-06-06 DIAGNOSIS — F419 Anxiety disorder, unspecified: Secondary | ICD-10-CM | POA: Insufficient documentation

## 2018-06-06 DIAGNOSIS — E876 Hypokalemia: Secondary | ICD-10-CM | POA: Insufficient documentation

## 2018-06-06 DIAGNOSIS — Z9181 History of falling: Secondary | ICD-10-CM | POA: Diagnosis not present

## 2018-06-06 DIAGNOSIS — G43709 Chronic migraine without aura, not intractable, without status migrainosus: Secondary | ICD-10-CM | POA: Diagnosis not present

## 2018-06-06 DIAGNOSIS — F329 Major depressive disorder, single episode, unspecified: Secondary | ICD-10-CM | POA: Insufficient documentation

## 2018-06-06 DIAGNOSIS — G8929 Other chronic pain: Secondary | ICD-10-CM | POA: Insufficient documentation

## 2018-06-06 DIAGNOSIS — I2699 Other pulmonary embolism without acute cor pulmonale: Secondary | ICD-10-CM | POA: Diagnosis present

## 2018-06-06 DIAGNOSIS — M48061 Spinal stenosis, lumbar region without neurogenic claudication: Secondary | ICD-10-CM | POA: Diagnosis not present

## 2018-06-06 DIAGNOSIS — G629 Polyneuropathy, unspecified: Secondary | ICD-10-CM | POA: Diagnosis not present

## 2018-06-06 DIAGNOSIS — Z8249 Family history of ischemic heart disease and other diseases of the circulatory system: Secondary | ICD-10-CM | POA: Insufficient documentation

## 2018-06-06 DIAGNOSIS — Z888 Allergy status to other drugs, medicaments and biological substances status: Secondary | ICD-10-CM | POA: Diagnosis not present

## 2018-06-06 DIAGNOSIS — J9811 Atelectasis: Secondary | ICD-10-CM | POA: Insufficient documentation

## 2018-06-06 DIAGNOSIS — M170 Bilateral primary osteoarthritis of knee: Secondary | ICD-10-CM | POA: Diagnosis not present

## 2018-06-06 DIAGNOSIS — F411 Generalized anxiety disorder: Secondary | ICD-10-CM | POA: Diagnosis not present

## 2018-06-06 DIAGNOSIS — R262 Difficulty in walking, not elsewhere classified: Secondary | ICD-10-CM | POA: Insufficient documentation

## 2018-06-06 DIAGNOSIS — Z79899 Other long term (current) drug therapy: Secondary | ICD-10-CM | POA: Insufficient documentation

## 2018-06-06 LAB — COMPREHENSIVE METABOLIC PANEL
ALT: 19 U/L (ref 0–44)
AST: 27 U/L (ref 15–41)
Albumin: 3.6 g/dL (ref 3.5–5.0)
Alkaline Phosphatase: 69 U/L (ref 38–126)
Anion gap: 8 (ref 5–15)
BUN: 11 mg/dL (ref 6–20)
CO2: 27 mmol/L (ref 22–32)
Calcium: 8.6 mg/dL — ABNORMAL LOW (ref 8.9–10.3)
Chloride: 104 mmol/L (ref 98–111)
Creatinine, Ser: 1.01 mg/dL — ABNORMAL HIGH (ref 0.44–1.00)
GFR calc Af Amer: >60 mL/min (ref >60)
GFR calc non Af Amer: >60 mL/min (ref >60)
Glucose, Bld: 141 mg/dL — ABNORMAL HIGH (ref 70–99)
Potassium: 3.5 mmol/L (ref 3.5–5.1)
Sodium: 139 mmol/L (ref 135–145)
Total Bilirubin: 0.4 mg/dL (ref 0.3–1.2)
Total Protein: 6.8 g/dL (ref 6.5–8.1)

## 2018-06-06 LAB — CBC WITH DIFFERENTIAL/PLATELET
Abs Immature Granulocytes: 0.04 10*3/uL (ref 0.00–0.07)
Basophils Absolute: 0 10*3/uL (ref 0.0–0.1)
Basophils Relative: 0 %
Eosinophils Absolute: 0.4 10*3/uL (ref 0.0–0.5)
Eosinophils Relative: 4 %
HCT: 40.1 % (ref 36.0–46.0)
Hemoglobin: 13.2 g/dL (ref 12.0–15.0)
Immature Granulocytes: 0 %
Lymphocytes Relative: 32 %
Lymphs Abs: 3.1 10*3/uL (ref 0.7–4.0)
MCH: 29.2 pg (ref 26.0–34.0)
MCHC: 32.9 g/dL (ref 30.0–36.0)
MCV: 88.7 fL (ref 80.0–100.0)
Monocytes Absolute: 0.8 10*3/uL (ref 0.1–1.0)
Monocytes Relative: 8 %
Neutro Abs: 5.4 10*3/uL (ref 1.7–7.7)
Neutrophils Relative %: 56 %
Platelets: 338 10*3/uL (ref 150–400)
RBC: 4.52 MIL/uL (ref 3.87–5.11)
RDW: 12.4 % (ref 11.5–15.5)
WBC: 9.7 10*3/uL (ref 4.0–10.5)
nRBC: 0 % (ref 0.0–0.2)

## 2018-06-06 LAB — URINALYSIS, ROUTINE W REFLEX MICROSCOPIC
Bilirubin Urine: NEGATIVE
Glucose, UA: NEGATIVE mg/dL
Ketones, ur: NEGATIVE mg/dL
Leukocytes, UA: NEGATIVE
Nitrite: NEGATIVE
Protein, ur: NEGATIVE mg/dL
Specific Gravity, Urine: 1.015 (ref 1.005–1.030)
pH: 8.5 — ABNORMAL HIGH (ref 5.0–8.0)

## 2018-06-06 LAB — URINALYSIS, MICROSCOPIC (REFLEX)

## 2018-06-06 LAB — TROPONIN I: Troponin I: <0.03 ng/mL (ref <0.03)

## 2018-06-06 LAB — BRAIN NATRIURETIC PEPTIDE: B Natriuretic Peptide: 11.9 pg/mL (ref 0.0–100.0)

## 2018-06-06 LAB — PREGNANCY, URINE: Preg Test, Ur: NEGATIVE

## 2018-06-06 MED ORDER — OXYCODONE-ACETAMINOPHEN 10-325 MG PO TABS: 1.0000 | ORAL_TABLET | ORAL | Status: DC | PRN

## 2018-06-06 MED ORDER — SIMETHICONE 80 MG PO CHEW: 160.0000 mg | CHEWABLE_TABLET | Freq: Four times a day (QID) | ORAL | Status: DC | PRN

## 2018-06-06 MED ORDER — GABAPENTIN 300 MG PO CAPS
300.0000 mg | ORAL_CAPSULE | Freq: Two times a day (BID) | ORAL | Status: DC
  Administered 2018-06-07 (×2): 300 mg via ORAL
  Filled 2018-06-06 (×2): qty 1

## 2018-06-06 MED ORDER — OXYCODONE-ACETAMINOPHEN 5-325 MG PO TABS
1.0000 | ORAL_TABLET | ORAL | Status: DC | PRN
  Administered 2018-06-06 – 2018-06-08 (×8): 1 via ORAL
  Filled 2018-06-06 (×9): qty 1

## 2018-06-06 MED ORDER — HYDROCHLOROTHIAZIDE 25 MG PO TABS
25.0000 mg | ORAL_TABLET | Freq: Every day | ORAL | Status: DC
  Administered 2018-06-07 – 2018-06-08 (×2): 25 mg via ORAL
  Filled 2018-06-06 (×2): qty 1

## 2018-06-06 MED ORDER — LEVALBUTEROL HCL 1.25 MG/0.5ML IN NEBU
1.2500 mg | INHALATION_SOLUTION | Freq: Four times a day (QID) | RESPIRATORY_TRACT | Status: DC
  Administered 2018-06-06: 1.25 mg via RESPIRATORY_TRACT
  Filled 2018-06-06: qty 0.5

## 2018-06-06 MED ORDER — HEPARIN (PORCINE) 25000 UT/250ML-% IV SOLN
1750.0000 [IU]/h | INTRAVENOUS | Status: DC
  Administered 2018-06-06: 1500 [IU]/h via INTRAVENOUS
  Filled 2018-06-06 (×2): qty 250

## 2018-06-06 MED ORDER — MORPHINE SULFATE (PF) 4 MG/ML IV SOLN
4.0000 mg | Freq: Once | INTRAVENOUS | Status: AC
  Administered 2018-06-06: 4 mg via INTRAVENOUS
  Filled 2018-06-06: qty 1

## 2018-06-06 MED ORDER — LEVALBUTEROL HCL 1.25 MG/0.5ML IN NEBU
1.2500 mg | INHALATION_SOLUTION | Freq: Two times a day (BID) | RESPIRATORY_TRACT | Status: DC
  Administered 2018-06-07 – 2018-06-08 (×3): 1.25 mg via RESPIRATORY_TRACT
  Filled 2018-06-06 (×3): qty 0.5

## 2018-06-06 MED ORDER — FLUTICASONE PROPIONATE 50 MCG/ACT NA SUSP: 2.0000 | Freq: Every day | NASAL | Status: DC | PRN

## 2018-06-06 MED ORDER — DM-GUAIFENESIN ER 30-600 MG PO TB12
1.0000 | ORAL_TABLET | Freq: Two times a day (BID) | ORAL | Status: DC | PRN
  Filled 2018-06-06: qty 1

## 2018-06-06 MED ORDER — OXYCODONE HCL 5 MG PO TABS
30.0000 mg | ORAL_TABLET | ORAL | Status: DC | PRN
  Administered 2018-06-06 – 2018-06-07 (×2): 30 mg via ORAL
  Filled 2018-06-06 (×2): qty 6

## 2018-06-06 MED ORDER — LORATADINE 10 MG PO TABS: 10.0000 mg | ORAL_TABLET | Freq: Every day | ORAL | Status: DC | PRN

## 2018-06-06 MED ORDER — SUMATRIPTAN SUCCINATE 50 MG PO TABS
50.0000 mg | ORAL_TABLET | ORAL | Status: DC | PRN
  Filled 2018-06-06: qty 1

## 2018-06-06 MED ORDER — ADULT MULTIVITAMIN W/MINERALS CH
1.0000 | ORAL_TABLET | Freq: Every day | ORAL | Status: DC
  Filled 2018-06-06 (×2): qty 1

## 2018-06-06 MED ORDER — CALCIUM CARBONATE ANTACID 500 MG PO CHEW: 1.0000 | CHEWABLE_TABLET | Freq: Two times a day (BID) | ORAL | Status: DC | PRN

## 2018-06-06 MED ORDER — HYDROMORPHONE HCL 2 MG PO TABS
1.0000 mg | ORAL_TABLET | Freq: Four times a day (QID) | ORAL | Status: DC | PRN
  Administered 2018-06-07 – 2018-06-08 (×2): 1 mg via ORAL
  Filled 2018-06-06 (×3): qty 1

## 2018-06-06 MED ORDER — PANTOPRAZOLE SODIUM 40 MG PO TBEC
40.0000 mg | DELAYED_RELEASE_TABLET | Freq: Every day | ORAL | Status: DC
  Filled 2018-06-06: qty 1

## 2018-06-06 MED ORDER — METHOCARBAMOL 500 MG PO TABS
750.0000 mg | ORAL_TABLET | Freq: Four times a day (QID) | ORAL | Status: DC | PRN
  Administered 2018-06-06 – 2018-06-08 (×4): 750 mg via ORAL
  Filled 2018-06-06 (×4): qty 2

## 2018-06-06 MED ORDER — ALPRAZOLAM 0.5 MG PO TABS
0.5000 mg | ORAL_TABLET | Freq: Three times a day (TID) | ORAL | Status: DC | PRN
  Administered 2018-06-06 – 2018-06-07 (×2): 0.5 mg via ORAL
  Filled 2018-06-06 (×2): qty 1

## 2018-06-06 MED ORDER — PROMETHAZINE HCL 25 MG PO TABS
25.0000 mg | ORAL_TABLET | Freq: Four times a day (QID) | ORAL | Status: DC | PRN
  Administered 2018-06-07: 25 mg via ORAL
  Filled 2018-06-06: qty 1

## 2018-06-06 MED ORDER — OXYCODONE HCL 5 MG PO TABS
5.0000 mg | ORAL_TABLET | ORAL | Status: DC | PRN
  Administered 2018-06-07: 5 mg via ORAL
  Filled 2018-06-06 (×3): qty 1

## 2018-06-06 MED ORDER — HYDROMORPHONE HCL 1 MG/ML IJ SOLN
1.0000 mg | Freq: Once | INTRAMUSCULAR | Status: AC
  Administered 2018-06-06: 1 mg via INTRAVENOUS
  Filled 2018-06-06: qty 1

## 2018-06-06 MED ORDER — HEPARIN BOLUS VIA INFUSION
3000.0000 [IU] | Freq: Once | INTRAVENOUS | Status: AC
  Administered 2018-06-06: 3000 [IU] via INTRAVENOUS

## 2018-06-06 MED ORDER — SODIUM CHLORIDE 0.9 % IV BOLUS
1000.0000 mL | Freq: Once | INTRAVENOUS | Status: AC
  Administered 2018-06-06: 1000 mL via INTRAVENOUS

## 2018-06-06 MED ORDER — ONDANSETRON HCL 4 MG/2ML IJ SOLN
4.0000 mg | Freq: Once | INTRAMUSCULAR | Status: AC
  Administered 2018-06-06: 4 mg via INTRAVENOUS
  Filled 2018-06-06: qty 2

## 2018-06-06 MED ORDER — IOPAMIDOL (ISOVUE-370) INJECTION 76%
100.0000 mL | Freq: Once | INTRAVENOUS | Status: AC | PRN
  Administered 2018-06-06: 69 mL via INTRAVENOUS

## 2018-06-06 NOTE — Telephone Encounter (Signed)
Pt Physical therapist called to say Ms Currie is having SOB with any activity.  Her resting HR 118 and 120 today. Pt states that she has had a spinal fusion in December and has never had breathing issues before.  She states she has no other respiratory symptoms. She does have some sinus issues at present. No cough. Per protocol pt will be evaluated at the ED. Care advice read to patient Pt verbalized understanding of all instructions.  Reason for Disposition . [1] MODERATE difficulty breathing (e.g., speaks in phrases, SOB even at rest, pulse 100-120) AND [2] NEW-onset or WORSE than normal  Answer Assessment - Initial Assessment Questions 1. RESPIRATORY STATUS: "Describe your breathing?" (e.g., wheezing, shortness of breath, unable to speak, severe coughing)      Short of breath 2. ONSET: "When did this breathing problem begin?"      yesterday 3. PATTERN "Does the difficult breathing come and go, or has it been constant since it started?"      With activity 4. SEVERITY: "How bad is your breathing?" (e.g., mild, moderate, severe)    - MILD: No SOB at rest, mild SOB with walking, speaks normally in sentences, can lay down, no retractions, pulse < 100.    - MODERATE: SOB at rest, SOB with minimal exertion and prefers to sit, cannot lie down flat, speaks in phrases, mild retractions, audible wheezing, pulse 100-120.    - SEVERE: Very SOB at rest, speaks in single words, struggling to breathe, sitting hunched forward, retractions, pulse > 120     Moderate 5. RECURRENT SYMPTOM: "Have you had difficulty breathing before?" If so, ask: "When was the last time?" and "What happened that time?"     No 6. CARDIAC HISTORY: "Do you have any history of heart disease?" (e.g., heart attack, angina, bypass surgery, angioplasty)     no 7. LUNG HISTORY: "Do you have any history of lung disease?"  (e.g., pulmonary embolus, asthma, emphysema)     No 8. CAUSE: "What do you think is causing the breathing problem?"     No 9. OTHER SYMPTOMS: "Do you have any other symptoms? (e.g., dizziness, runny nose, cough, chest pain, fever)     Sinus stuffy 10. PREGNANCY: "Is there any chance you are pregnant?" "When was your last menstrual period?"       No histerectomy 11. TRAVEL: "Have you traveled out of the country in the last month?" (e.g., travel history, exposures)       No  Protocols used: BREATHING DIFFICULTY-A-AH

## 2018-06-06 NOTE — Progress Notes (Signed)
ANTICOAGULATION CONSULT NOTE - Initial Consult  Pharmacy Consult for Heparin Indication: pulmonary embolus  Allergies  Allergen Reactions  . Gadolinium Derivatives Swelling and Other (See Comments)    Arm swelling, tingling, & redness. Radiologist Dr. Grace Isaac recommends an injection of steroids, or 13-hour prep if non-emergent, if patient needs a gadolinium based contrast in the future.  . Other Rash and Other (See Comments)    Glue or tape to seal up wound  . Zoloft [Sertraline Hcl] Other (See Comments)    Hair loss    Patient Measurements: Height: 5\' 9"  (175.3 cm) Weight: 278 lb (126.1 kg) IBW/kg (Calculated) : 66.2 Heparin Dosing Weight: 95.8 kg  Vital Signs: Temp: 99.8 F (37.7 C) (01/06 1605) Temp Source: Oral (01/06 1605) BP: 131/91 (01/06 1543) Pulse Rate: 139 (01/06 1543)  Labs: Recent Labs    06/06/18 1605  HGB 13.2  HCT 40.1  PLT 338  CREATININE 1.01*  TROPONINI <0.03    Estimated Creatinine Clearance: 102.3 mL/min (A) (by C-G formula based on SCr of 1.01 mg/dL (H)).   Medical History: Past Medical History:  Diagnosis Date  . Allergy   . Anxiety   . Arthritis   . B12 deficiency   . Depression    not currently   . Dysrhythmia    hx tachy-was from medication nortriptylline  . Esophagitis    rx  . Fibromyalgia   . GERD (gastroesophageal reflux disease)   . History of echocardiogram    Echo 4/18: Mild concentric LVH, vigorous LVF, EF 65-70, normal wall motion, grade 1 diastolic dysfunction  . History of kidney stones   . Hyperlipidemia   . Intervertebral disc protrusion 01/11/2014  . Migraine   . Obesity (BMI 35.0-39.9 without comorbidity)   . Peripheral neuropathy   . Polycystic ovaries   . Spinal stenosis, lumbar   . Vitamin D deficiency     Assessment: 45 YOF who presented to the [facility] on 1[facility]6 with SOB and CTA showing PE with no R-heart strain. The patient was noted to have recent back surgery (anterior lumbar fusion) on 05/04/18.    Baseline CBC wnl, will give a reduced bolus due to recent back surgery.   Goal of Therapy:  Heparin level 0.3-0.7 units/ml Monitor platelets by anticoagulation protocol: Yes   Plan:  - Heparin 3000 unit bolus x 1 - Start Heparin at a rate of 1500 units/hr (15 ml/hr) - Will continue to monitor for any signs/symptoms of bleeding and will follow up with heparin level in 6 hours   Thank you for allowing pharmacy to be a part of this patient's care.  Georgina Pillion, PharmD, BCPS Clinical Pharmacist Please check AMION for all [facility] numbers 06/06/2018 6:18 PM

## 2018-06-06 NOTE — Progress Notes (Signed)
43 year old female status post spinal fusion around 1 month ago, still using walker presenting with 2 days history of shortness of breath was found to have right lung PE with no cor pulmonale.  Start IV heparin/accepted to telemetry .

## 2018-06-06 NOTE — ED Notes (Signed)
Patient transported to CT 

## 2018-06-06 NOTE — ED Provider Notes (Signed)
[facility] EMERGENCY DEPARTMENT Provider Note   CSN: [id number] Arrival date & time: 06/06/18  1535     History   Chief Complaint Chief Complaint  Patient presents with  . Shortness of Breath    HPI [name] is a 43 y.o. female.  Pt presents to the ED today with SOB.  She had back surgery 4 weeks ago and is still walking with a walker.  She started feeling SOB yesterday and does not feel well now.  She said she is sob just lying down which is not normal for her.  She said both legs have been hurting, but the right leg started hurting yesterday.  She has not had a fever or a cough.  No runny nose.  No known sick contacts.     Past Medical History:  Diagnosis Date  . Allergy   . Anxiety   . Arthritis   . B12 deficiency   . Depression    not currently   . Dysrhythmia    hx tachy-was from medication nortriptylline  . Esophagitis    rx  . Fibromyalgia   . GERD (gastroesophageal reflux disease)   . History of echocardiogram    Echo 4/18: Mild concentric LVH, vigorous LVF, EF 65-70, normal wall motion, grade 1 diastolic dysfunction  . History of kidney stones   . Hyperlipidemia   . Intervertebral disc protrusion 01/11/2014  . Migraine   . Obesity (BMI 35.0-39.9 without comorbidity)   . Peripheral neuropathy   . Polycystic ovaries   . Spinal stenosis, lumbar   . Vitamin D deficiency     Patient Active Problem List   Diagnosis Date Noted  . Pulmonary embolism (HCC) 06/06/2018  . DDD (degenerative disc disease), lumbar 05/04/2018  . Atypical chest pain   . Spinal stenosis of lumbar region 04/01/2015  . Chronic migraine without aura without status migrainosus, not intractable 03/04/2015  . Elevated plasma metanephrines 11/16/2014  . Alopecia 05/21/2014  . Chronic tachycardia 04/04/2014  . Sciatica 01/11/2014  . Back pain 01/11/2014  . Intervertebral disc protrusion 01/11/2014  . HTN (hypertension) 01/04/2013  . Hemorrhoids 04/25/2012  . Urinary  retention 12/11/2010  . CERUMEN IMPACTION 01/28/2010  . Depression 04/08/2009  . FIBROMYALGIA 10/11/2008  . Morbid obesity (HCC) 08/10/2008  . ACNE VULGARIS 08/10/2008  . INSOMNIA 06/20/2008  . VITAMIN B12 DEFICIENCY 03/23/2008  . PERIPHERAL NEUROPATHY 02/27/2008  . CONSTIPATION 08/22/2007  . ANXIETY DISORDER, GENERALIZED 01/04/2007  . Gastroesophageal reflux disease 01/04/2007  . DEGENERATIVE JOINT DISEASE, KNEES, BILATERAL 01/04/2007  . RENAL CALCULUS, HX OF 01/04/2007    Past Surgical History:  Procedure Laterality Date  . 24 HOUR PH STUDY N/A 05/27/2016   Procedure: 24 HOUR PH STUDY;  Surgeon: Ruffin Frederick, MD;  Location: [facility];  Service: Gastroenterology;  Laterality: N/A;  . ABDOMINAL EXPOSURE N/A 05/04/2018   Procedure: ABDOMINAL EXPOSURE;  Surgeon: Larina Earthly, MD;  Location: [facility] OR;  Service: Vascular;  Laterality: N/A;  . ANTERIOR LUMBAR FUSION N/A 05/04/2018   Procedure: Lumbar Five Sacral One Anterior lumbar interbody fusion;  Surgeon: Donalee Citrin, MD;  Location: [facility] OR;  Service: Neurosurgery;  Laterality: N/A;  Lumbar Five Sacral One Anterior lumbar interbody fusion  . APPENDECTOMY    . BACK SURGERY  2012   left laminectomy at L3-4 per Dr. Phoebe Perch   . bladder tack  2012  . CHOLECYSTECTOMY N/A 10/03/2015   Procedure: LAPAROSCOPIC CHOLECYSTECTOMY;  Surgeon: Rodman Pickle, MD;  Location: [facility] OR;  Service: General;  Laterality: N/A;  . ESOPHAGEAL MANOMETRY N/A 05/27/2016   Procedure: ESOPHAGEAL MANOMETRY (EM);  Surgeon: Ruffin FrederickSteven Paul Armbruster, MD;  Location: [facility];  Service: Gastroenterology;  Laterality: N/A;  . EXCISION OF SKIN TAG  10/03/2015   Procedure: EXCISION OF SKIN TAG;  Surgeon: De BlanchLuke Aaron Kinsinger, MD;  Location: [facility] OR;  Service: General;;  . LUMBAR DISC SURGERY  01-31-14   per Dr. Wynetta Emeryram   . OOPHORECTOMY     left overy  . OVARIAN CYST REMOVAL    . RECTOCELE REPAIR    . SINUSOTOMY  12/14/06  . spinal fusion  2016/10  . TONSILLECTOMY      . TONSILLECTOMY    . VAGINAL HYSTERECTOMY    . WRIST GANGLION EXCISION Left      OB History    Gravida  2   Para  2   Term      Preterm  2   AB  0   Living  2     SAB      TAB      Ectopic      Multiple      Live Births  2            Home Medications    Prior to Admission medications   Medication Sig Start Date End Date Taking? Authorizing Provider  ALPRAZolam (XANAX) 0.5 MG tablet TAKE 1 TABLET BY MOUTH THREE TIMES A DAY AS NEEDED Patient taking differently: Take 0.5 mg by mouth 3 (three) times daily as needed for anxiety.  11/03/17   Nelwyn SalisburyFry, [name], MD  calcium carbonate (TUMS - DOSED IN MG ELEMENTAL CALCIUM) 500 MG chewable tablet Chew 1 tablet by mouth 2 (two) times daily as needed for indigestion or heartburn.    [provider]  cetirizine (ZYRTEC) 10 MG tablet Take 10 mg by mouth daily.    [provider]  cyanocobalamin (,VITAMIN B-12,) 1000 MCG/ML injection INJECT 1 ML (1,000 MCG TOTAL) INTO THE MUSCLE EVERY 7 DAYS. Patient taking differently: Inject 1,000 mcg into the muscle once a week. INJECT 1 ML (1,000 MCG TOTAL) INTO THE MUSCLE EVERY 7 DAYS. 05/31/17   Nelwyn SalisburyFry, [name], MD  fluticasone (FLONASE) 50 MCG/ACT nasal spray Place 2 sprays into both nostrils daily.     [provider]  gabapentin (NEURONTIN) 300 MG capsule Take 1 capsule (300 mg total) by mouth 3 (three) times daily. Patient taking differently: Take 300 mg by mouth 2 (two) times daily.  04/12/18   Nelwyn SalisburyFry, [name], MD  hydrochlorothiazide (HYDRODIURIL) 25 MG tablet TAKE 1 TABLET BY MOUTH EVERY DAY 05/06/18   Nelwyn SalisburyFry, [name], MD  HYDROmorphone (DILAUDID) 2 MG tablet Take 0.5 tablets (1 mg total) by mouth every 6 (six) hours as needed for severe pain. 05/10/18 05/10/19  Meyran, Tiana LoftKimberly Hannah, NP  methocarbamol (ROBAXIN) 750 MG tablet Take 1 tablet (750 mg total) by mouth every 6 (six) hours as needed for muscle spasms. 05/10/18   Meyran, Tiana LoftKimberly Hannah, NP  Multiple  Vitamins-Minerals (MULTI-VITAMIN GUMMIES PO) Take 2 tablets by mouth daily.     [provider]  nitrofurantoin, macrocrystal-monohydrate, (MACROBID) 100 MG capsule Take 100 mg by mouth daily as needed (for uti prophylaxis, after intercourse).  09/26/16   [provider]  oxyCODONE (ROXICODONE) 15 MG immediate release tablet Take 1 tablet (15 mg total) by mouth every 4 (four) hours as needed for pain. 05/09/18 06/08/18  Nelwyn SalisburyFry, [name], MD  oxyCODONE-acetaminophen (PERCOCET) 10-325 MG tablet  Take 1 tablet by mouth every 4 (four) hours as needed for pain. 05/10/18 05/10/19  Meyran, Tiana Loft, NP  pantoprazole (PROTONIX) 40 MG tablet TAKE 1 TABLET BY MOUTH TWICE A DAY **NEED PA FOR BID** Patient taking differently: Take 40 mg by mouth daily.  12/17/17   Armbruster, Willaim Rayas, MD  potassium chloride (KLOR-CON 10) 10 MEQ tablet Take 1 tablet (10 mEq total) by mouth daily. Patient taking differently: Take 10 mEq by mouth daily as needed (for potassium levels).  03/26/16   [name], MD  promethazine (PHENERGAN) 25 MG tablet TAKE 1 TABLET BY MOUTH EVERY 6 HOURS AS NEEDED FOR NAUSEA Patient taking differently: TAKE 25 MG BY MOUTH EVERY 6 HOURS AS NEEDED FOR NAUSEA 02/19/17   [name], MD  simethicone (MYLICON) 80 MG chewable tablet Chew 160 mg by mouth every 6 (six) hours as needed (gas pain).    [provider]  SUMAtriptan (IMITREX) 100 MG tablet Take 1 tablet earliest onset of migraine.  May repeat once in 2 hours if headache persists or recurs. 10/14/16   Jaffe, Adam R, DO  SYRINGE-NEEDLE, DISP, 3 ML (B-D 3CC LUER-LOK SYR 25GX1") 25G X 1" 3 ML MISC USE AS DIRECTED 08/05/17   [name], MD  SYRINGE-NEEDLE, DISP, 3 ML (B-D 3CC LUER-LOK SYR 25GX1") 25G X 1" 3 ML MISC USE AS DIRECTED 04/15/18   [name], MD  Vitamin D, Ergocalciferol, (DRISDOL) 50000 units CAPS capsule TAKE 1 CAPSULE (50,000 UNITS TOTAL) BY MOUTH EVERY 7 (SEVEN) DAYS. 05/31/17   [name], MD     Family History Family History  Problem Relation Age of Onset  . Fibromyalgia Mother   . Diabetes Mother   . Thyroid cancer Mother   . Liver disease Mother   . Neuropathy Mother   . Cirrhosis Mother        non-alcoholic  . Pulmonary embolism Mother        both lungs  . Hypertension Father   . Diabetes Father   . Heart disease Father   . Prostate cancer Father   . Heart attack Father        x 2  . Colon cancer Neg Hx   . Other Neg Hx        pheochromocytoma  . Colon polyps Neg Hx     Social History Social History   Tobacco Use  . Smoking status: Former Smoker    Types: Cigarettes    Last attempt to quit: 1999    Years since quitting: 21.0  . Smokeless tobacco: Never Used  Substance Use Topics  . Alcohol use: No    Alcohol/week: 0.0 standard drinks  . Drug use: No     Allergies   Gadolinium derivatives; Other; and Zoloft [sertraline hcl]   Review of Systems Review of Systems  Respiratory: Positive for shortness of breath.   All other systems reviewed and are negative.    Physical Exam Updated Vital Signs BP (!) 131/91 (BP Location: Left Arm)   Pulse (!) 139   Temp 99.8 F (37.7 C) (Oral)   Resp 18   Ht 5\' 9"  (1.753 m)   Wt 126.1 kg   LMP 12/13/1999 Comment: single oophorectomy//a.c.  SpO2 98%   BMI 41.05 kg/m   Physical Exam Vitals signs and nursing note reviewed.  Constitutional:      Appearance: She is well-developed. She is obese.  HENT:     Head: Normocephalic and atraumatic.  Mouth/Throat:     Mouth: Mucous membranes are moist.  Eyes:     Extraocular Movements: Extraocular movements intact.     Pupils: Pupils are equal, round, and reactive to light.  Neck:     Musculoskeletal: Normal range of motion and neck supple.  Cardiovascular:     Rate and Rhythm: Tachycardia present.  Pulmonary:     Effort: Tachypnea present.  Abdominal:     General: Bowel sounds are normal.     Palpations: Abdomen is soft.  Musculoskeletal: Normal  range of motion.  Skin:    General: Skin is warm.     Capillary Refill: Capillary refill takes less than 2 seconds.  Neurological:     General: No focal deficit present.     Mental Status: She is alert and oriented to person, place, and time.  Psychiatric:        Mood and Affect: Mood normal.        Behavior: Behavior normal.      ED Treatments / Results  Labs (all labs ordered are listed, but only abnormal results are displayed) Labs Reviewed  COMPREHENSIVE METABOLIC PANEL - Abnormal; Notable for the following components:      Result Value   Glucose, Bld 141 (*)    Creatinine, Ser 1.01 (*)    Calcium 8.6 (*)    All other components within normal limits  URINALYSIS, ROUTINE W REFLEX MICROSCOPIC - Abnormal; Notable for the following components:   pH 8.5 (*)    Hgb urine dipstick TRACE (*)    All other components within normal limits  URINALYSIS, MICROSCOPIC (REFLEX) - Abnormal; Notable for the following components:   Bacteria, UA FEW (*)    All other components within normal limits  CBC WITH DIFFERENTIAL/PLATELET  TROPONIN I  BRAIN NATRIURETIC PEPTIDE  PREGNANCY, URINE    EKG EKG Interpretation  Date/Time:  Monday June 06 2018 15:48:18 EST Ventricular Rate:  138 PR Interval:  128 QRS Duration: 68 QT Interval:  292 QTC Calculation: 442 R Axis:   52 Text Interpretation:  Sinus tachycardia Otherwise normal ECG Since last tracing rate faster Confirmed by Jacalyn LefevreHaviland, Corben Auzenne [phone] on 06/06/2018 3:57:07 PM   Radiology Ct Angio Chest Pe W Or Wo Contrast  Result Date: 06/06/2018 CLINICAL DATA:  Shortness of breath and tachycardia EXAM: CT ANGIOGRAPHY CHEST WITH CONTRAST TECHNIQUE: Multidetector CT imaging of the chest was performed using the standard protocol during bolus administration of intravenous contrast. Multiplanar CT image reconstructions and MIPs were obtained to evaluate the vascular anatomy. CONTRAST:  69 mL ISOVUE-370 IOPAMIDOL (ISOVUE-370) INJECTION 76%  COMPARISON:  CT angiogram chest September 03, 2016 FINDINGS: Cardiovascular: There are incompletely obstructing pulmonary emboli in branches of the right lower lobe pulmonary artery. No more central pulmonary emboli evident. There is no appreciable right heart strain. There is no appreciable thoracic aortic aneurysm or dissection. Visualized great vessels appear unremarkable. There is no pericardial effusion or pericardial thickening evident. Mediastinum/Nodes: Visualized thyroid appears normal. There is no appreciable thoracic adenopathy. No esophageal lesions are appreciable. Lungs/Pleura: There is atelectatic change in the lower lobe regions. There is no edema or consolidation. No pleural effusion or pleural thickening evident. Upper Abdomen: There is hepatic steatosis. Visualized upper abdominal structures otherwise appear unremarkable. Musculoskeletal: There are foci of degenerative change in the thoracic spine. There are no appreciable blastic or lytic bone lesions. No chest wall lesions are evident. Review of the MIP images confirms the above findings. IMPRESSION: 1. Foci of incompletely obstructing pulmonary embolus in  lower lobe pulmonary artery branches on the right. No more central pulmonary embolus evident. No thoracic aortic aneurysm or dissection. No right heart strain. 2.  Mild bibasilar atelectasis.  No lung edema or consolidation. 3.  No appreciable thoracic adenopathy. 4.  Hepatic steatosis. Critical Value/emergent results were called by telephone at the time of interpretation on 06/06/2018 at 5:29 pm to Dr. Jacalyn Lefevre , who verbally acknowledged these results. Electronically Signed   By: Bretta Bang III M.D.   On: 06/06/2018 17:29    Procedures Procedures (including critical care time)  Medications Ordered in ED Medications  HYDROmorphone (DILAUDID) tablet 1 mg (has no administration in time range)  oxyCODONE (Oxy IR/ROXICODONE) immediate release tablet 30 mg (has no administration in  time range)  sodium chloride 0.9 % bolus 1,000 mL (1,000 mLs Intravenous New Bag/Given 06/06/18 1617)  morphine 4 MG/ML injection 4 mg (4 mg Intravenous Given 06/06/18 1630)  ondansetron (ZOFRAN) injection 4 mg (4 mg Intravenous Given 06/06/18 1627)  iopamidol (ISOVUE-370) 76 % injection 100 mL (69 mLs Intravenous Contrast Given 06/06/18 1708)     Initial Impression / Assessment and Plan / ED Course  I have reviewed the triage vital signs and the nursing notes.  Pertinent labs & imaging results that were available during my care of the patient were reviewed by me and considered in my medical decision making (see chart for details).     Pt was started on 2L oxygen.  She does have evidence of PE on CT angio.  She is very sob with ambulation.  The pt will be started on a heparin drip in the ED per pharmacy consult.  She was d/w Dr. Sharyon Medicus (triad) who will admit.  CRITICAL CARE Performed by: Jacalyn Lefevre   Total critical care time: 30 minutes  Critical care time was exclusive of separately billable procedures and treating other patients.  Critical care was necessary to treat or prevent imminent or life-threatening deterioration.  Critical care was time spent personally by me on the following activities: development of treatment plan with patient and/or surrogate as well as nursing, discussions with consultants, evaluation of patient's response to treatment, examination of patient, obtaining history from patient or surrogate, ordering and performing treatments and interventions, ordering and review of laboratory studies, ordering and review of radiographic studies, pulse oximetry and re-evaluation of patient's condition.   Final Clinical Impressions(s) / ED Diagnoses   Final diagnoses:  Multiple subsegmental pulmonary emboli without acute cor pulmonale    ED Discharge Orders    None       Jacalyn Lefevre, MD 06/06/18 1818

## 2018-06-06 NOTE — ED Triage Notes (Addendum)
C/o SOB, heart racing, "feeling shaky" x 2 days-pt to triage on walker-back surgery 4 weeks ago-DOE noted

## 2018-06-06 NOTE — H&P (Signed)
History and Physical    [name]:[id number] DOB: 04/26/1976 DOA: 06/06/2018  Referring MD/NP/PA:   PCP: [name], MD   Patient coming from:  The patient is coming from home.  At baseline, pt is independent for most of ADL.        Chief Complaint: SOB  HPI: [name] is a 43 y.o. female with medical history significant of hypertension, hyperlipidemia, GERD, depression, anxiety, fibromyalgia, obesity, migraine headaches, lumbar spinal stenosis ( s/p of lumbar fusion surgery 1 month ago), who presents with shortness breath.  Patient states that she had lumbar spine fusion surgery 1 month ago. Surgical sites have been healing well.  She developed SOB in the past 2 days, which has been progressively getting worse.  She also has heart racing and palpitation.  She has mild chest pressure in the central chest, but no active chest pain.  Denies fever or chills.  No cough.  She has bilateral upper leg pain, no tenderness in the calf areas.  Denies nausea, vomiting, diarrhea, abdominal pain, symptoms of UTI.  ED Course: pt was found to have acute PE without CT evidence of right heart straining, WBC 9.7, negative troponin, BNP 11.9, negative pregnancy test, urinalysis has squamous cell contamination, creatinine 1.01, temperature 99.8, tachycardia, tachypnea, oxygen saturation 98% on room air.  Patient is placed on telemetry bed for observation.  Review of Systems:   General: no fevers, chills, no body weight gain, has fatigue HEENT: no blurry vision, hearing changes or sore throat Respiratory: has dyspnea, no coughing, wheezing CV: has chest presssure, no palpitations GI: no nausea, vomiting, abdominal pain, diarrhea, constipation GU: no dysuria, burning on urination, increased urinary frequency, hematuria  Ext: no leg edema Neuro: no unilateral weakness, numbness, or tingling, no vision change or hearing loss Skin: no rash, no skin tear. MSK: has lower back pain and bilateral  upper leg pain Heme: No easy bruising.  Travel history: No recent long distant travel.  Allergy:  Allergies  Allergen Reactions  . Gadolinium Derivatives Swelling and Other (See Comments)    Arm swelling, tingling, & redness. Radiologist Dr. Grace Isaac recommends an injection of steroids, or 13-hour prep if non-emergent, if patient needs a gadolinium based contrast in the future.  . Other Rash and Other (See Comments)    Glue or tape to seal up wound  . Zoloft [Sertraline Hcl] Other (See Comments)    Hair loss    Past Medical History:  Diagnosis Date  . Allergy   . Anxiety   . Arthritis   . B12 deficiency   . Depression    not currently   . Dysrhythmia    hx tachy-was from medication nortriptylline  . Esophagitis    rx  . Fibromyalgia   . GERD (gastroesophageal reflux disease)   . History of echocardiogram    Echo 4/18: Mild concentric LVH, vigorous LVF, EF 65-70, normal wall motion, grade 1 diastolic dysfunction  . History of kidney stones   . Hyperlipidemia   . Intervertebral disc protrusion 01/11/2014  . Migraine   . Obesity (BMI 35.0-39.9 without comorbidity)   . Peripheral neuropathy   . Polycystic ovaries   . Spinal stenosis, lumbar   . Vitamin D deficiency     Past Surgical History:  Procedure Laterality Date  . 24 HOUR PH STUDY N/A 05/27/2016   Procedure: 24 HOUR PH STUDY;  Surgeon: Ruffin Frederick, MD;  Location: [facility];  Service: Gastroenterology;  Laterality: N/A;  . ABDOMINAL  EXPOSURE N/A 05/04/2018   Procedure: ABDOMINAL EXPOSURE;  Surgeon: Larina EarthlyEarly, Todd F, MD;  [facility];  Service: Vascular;  Laterality: N/A;  . ANTERIOR LUMBAR FUSION N/A 05/04/2018   Procedure: Lumbar Five Sacral One Anterior lumbar interbody fusion;  Surgeon: Donalee Citrinram, Gary, MD;  [facility];  Service: Neurosurgery;  Laterality: N/A;  Lumbar Five Sacral One Anterior lumbar interbody fusion  . APPENDECTOMY    . BACK SURGERY  2012   left laminectomy at L3-4 per Dr. Phoebe PerchHirsch     . bladder tack  2012  . CHOLECYSTECTOMY N/A 10/03/2015   Procedure: LAPAROSCOPIC CHOLECYSTECTOMY;  Surgeon: Rodman PickleLuke Aaron Kinsinger, MD;  [facility];  Service: General;  Laterality: N/A;  . ESOPHAGEAL MANOMETRY N/A 05/27/2016   Procedure: ESOPHAGEAL MANOMETRY (EM);  Surgeon: Ruffin FrederickSteven Paul Armbruster, MD;  Location: [facility];  Service: Gastroenterology;  Laterality: N/A;  . EXCISION OF SKIN TAG  10/03/2015   Procedure: EXCISION OF SKIN TAG;  Surgeon: De BlanchLuke Aaron Kinsinger, MD;  Location: [facility];  Service: General;;  . LUMBAR DISC SURGERY  01-31-14   per Dr. Wynetta Emeryram   . OOPHORECTOMY     left overy  . OVARIAN CYST REMOVAL    . RECTOCELE REPAIR    . SINUSOTOMY  12/14/06  . spinal fusion  2016/10  . TONSILLECTOMY    . TONSILLECTOMY    . VAGINAL HYSTERECTOMY    . WRIST GANGLION EXCISION Left     Social History:  reports that she quit smoking about 21 years ago. Her smoking use included cigarettes. She has never used smokeless tobacco. She reports that she does not drink alcohol or use drugs.  Family History:  Family History  Problem Relation Age of Onset  . Fibromyalgia Mother   . Diabetes Mother   . Thyroid cancer Mother   . Liver disease Mother   . Neuropathy Mother   . Cirrhosis Mother        non-alcoholic  . Pulmonary embolism Mother        both lungs  . Hypertension Father   . Diabetes Father   . Heart disease Father   . Prostate cancer Father   . Heart attack Father        x 2  . Colon cancer Neg Hx   . Other Neg Hx        pheochromocytoma  . Colon polyps Neg Hx      Prior to Admission medications   Medication Sig Start Date End Date Taking? Authorizing Provider  ALPRAZolam (XANAX) 0.5 MG tablet TAKE 1 TABLET BY MOUTH THREE TIMES A DAY AS NEEDED Patient taking differently: Take 0.5 mg by mouth 3 (three) times daily as needed for anxiety.  11/03/17   Nelwyn SalisburyFry, [name], MD  calcium carbonate (TUMS - DOSED IN MG ELEMENTAL CALCIUM) 500 MG chewable tablet Chew 1 tablet by mouth 2  (two) times daily as needed for indigestion or heartburn.    [provider]  cetirizine (ZYRTEC) 10 MG tablet Take 10 mg by mouth daily.    [provider]  cyanocobalamin (,VITAMIN B-12,) 1000 MCG/ML injection INJECT 1 ML (1,000 MCG TOTAL) INTO THE MUSCLE EVERY 7 DAYS. Patient taking differently: Inject 1,000 mcg into the muscle once a week. INJECT 1 ML (1,000 MCG TOTAL) INTO THE MUSCLE EVERY 7 DAYS. 05/31/17   Nelwyn SalisburyFry, [name], MD  fluticasone (FLONASE) 50 MCG/ACT nasal spray Place 2 sprays into both nostrils daily.     [provider]  gabapentin (NEURONTIN) 300  MG capsule Take 1 capsule (300 mg total) by mouth 3 (three) times daily. Patient taking differently: Take 300 mg by mouth 2 (two) times daily.  04/12/18   [name], MD  hydrochlorothiazide (HYDRODIURIL) 25 MG tablet TAKE 1 TABLET BY MOUTH EVERY DAY 05/06/18   [name], MD  HYDROmorphone (DILAUDID) 2 MG tablet Take 0.5 tablets (1 mg total) by mouth every 6 (six) hours as needed for severe pain. 05/10/18 05/10/19  Meyran, Tiana Loft, NP  methocarbamol (ROBAXIN) 750 MG tablet Take 1 tablet (750 mg total) by mouth every 6 (six) hours as needed for muscle spasms. 05/10/18   Meyran, Tiana Loft, NP  Multiple Vitamins-Minerals (MULTI-VITAMIN GUMMIES PO) Take 2 tablets by mouth daily.     [provider]  nitrofurantoin, macrocrystal-monohydrate, (MACROBID) 100 MG capsule Take 100 mg by mouth daily as needed (for uti prophylaxis, after intercourse).  09/26/16   [provider]  oxyCODONE (ROXICODONE) 15 MG immediate release tablet Take 1 tablet (15 mg total) by mouth every 4 (four) hours as needed for pain. 05/09/18 06/08/18  [name], MD  oxyCODONE-acetaminophen (PERCOCET) 10-325 MG tablet Take 1 tablet by mouth every 4 (four) hours as needed for pain. 05/10/18 05/10/19  Meyran, Tiana Loft, NP  pantoprazole (PROTONIX) 40 MG tablet TAKE 1 TABLET BY MOUTH TWICE A DAY **NEED PA FOR  BID** Patient taking differently: Take 40 mg by mouth daily.  12/17/17   Armbruster, Willaim Rayas, MD  potassium chloride (KLOR-CON 10) 10 MEQ tablet Take 1 tablet (10 mEq total) by mouth daily. Patient taking differently: Take 10 mEq by mouth daily as needed (for potassium levels).  03/26/16   [name], MD  promethazine (PHENERGAN) 25 MG tablet TAKE 1 TABLET BY MOUTH EVERY 6 HOURS AS NEEDED FOR NAUSEA Patient taking differently: TAKE 25 MG BY MOUTH EVERY 6 HOURS AS NEEDED FOR NAUSEA 02/19/17   [name], MD  simethicone (MYLICON) 80 MG chewable tablet Chew 160 mg by mouth every 6 (six) hours as needed (gas pain).    [provider]  SUMAtriptan (IMITREX) 100 MG tablet Take 1 tablet earliest onset of migraine.  May repeat once in 2 hours if headache persists or recurs. 10/14/16   Jaffe, Adam R, DO  SYRINGE-NEEDLE, DISP, 3 ML (B-D 3CC LUER-LOK SYR 25GX1") 25G X 1" 3 ML MISC USE AS DIRECTED 08/05/17   [name], MD  SYRINGE-NEEDLE, DISP, 3 ML (B-D 3CC LUER-LOK SYR 25GX1") 25G X 1" 3 ML MISC USE AS DIRECTED 04/15/18   [name], MD  Vitamin D, Ergocalciferol, (DRISDOL) 50000 units CAPS capsule TAKE 1 CAPSULE (50,000 UNITS TOTAL) BY MOUTH EVERY 7 (SEVEN) DAYS. 05/31/17   [name], MD    Physical Exam: Vitals:   06/06/18 1900 06/06/18 2000 06/06/18 2100 06/07/18 0347  BP: 123/90 103/60 124/83 111/74  Pulse: (!) 106 (!) 112 (!) 122 93  Resp:  20 20 19   Temp:   99.7 F (37.6 C) 98.1 F (36.7 C)  TempSrc:   Oral Oral  SpO2: 97% 99% 98% 98%  Weight:   127.2 kg   Height:       General: Not in acute distress HEENT:       Eyes: PERRL, EOMI, no scleral icterus.       ENT: No discharge from the ears and nose, no pharynx injection, no tonsillar enlargement.        Neck: No JVD, no bruit, no mass felt. Heme:  No neck lymph node enlargement. Cardiac: S1/S2, RRR, No murmurs, No gallops or rubs. Respiratory: No rales, wheezing, rhonchi or rubs. GI: Soft, nondistended,  nontender, no rebound pain, no organomegaly, BS present. GU: No hematuria Ext: No pitting leg edema bilaterally. 2+DP/PT pulse bilaterally. Musculoskeletal: has lower back tenderness. Skin: No rashes.  Neuro: Alert, oriented X3, cranial nerves II-XII grossly intact, moves all extremities normally. Psych: Patient is not psychotic, no suicidal or hemocidal ideation.  Labs on Admission: I have personally reviewed following labs and imaging studies  CBC: Recent Labs  Lab 06/06/18 1605 06/07/18 0316  WBC 9.7 8.1  NEUTROABS 5.4  --   HGB 13.2 11.9*  HCT 40.1 36.8  MCV 88.7 89.3  PLT 338 300   Basic Metabolic Panel: Recent Labs  Lab 06/06/18 1605 06/07/18 0316  NA 139 140  K 3.5 3.3*  CL 104 106  CO2 27 26  GLUCOSE 141* 137*  BUN 11 8  CREATININE 1.01* 1.04*  CALCIUM 8.6* 8.3*   GFR: Estimated Creatinine Clearance: 99.8 mL/min (A) (by C-G formula based on SCr of 1.04 mg/dL (H)). Liver Function Tests: Recent Labs  Lab 06/06/18 1605  AST 27  ALT 19  ALKPHOS 69  BILITOT 0.4  PROT 6.8  ALBUMIN 3.6   No results for input(s): LIPASE, AMYLASE in the last 168 hours. No results for input(s): AMMONIA in the last 168 hours. Coagulation Profile: No results for input(s): INR, PROTIME in the last 168 hours. Cardiac Enzymes: Recent Labs  Lab 06/06/18 1605  TROPONINI <0.03   BNP (last 3 results) No results for input(s): PROBNP in the last 8760 hours. HbA1C: No results for input(s): HGBA1C in the last 72 hours. CBG: No results for input(s): GLUCAP in the last 168 hours. Lipid Profile: No results for input(s): CHOL, HDL, LDLCALC, TRIG, CHOLHDL, LDLDIRECT in the last 72 hours. Thyroid Function Tests: No results for input(s): TSH, T4TOTAL, FREET4, T3FREE, THYROIDAB in the last 72 hours. Anemia Panel: No results for input(s): VITAMINB12, FOLATE, FERRITIN, TIBC, IRON, RETICCTPCT in the last 72 hours. Urine analysis:    Component Value Date/Time   COLORURINE YELLOW  06/06/2018 1619   APPEARANCEUR CLEAR 06/06/2018 1619   LABSPEC 1.015 06/06/2018 1619   PHURINE 8.5 (H) 06/06/2018 1619   GLUCOSEU NEGATIVE 06/06/2018 1619   HGBUR TRACE (A) 06/06/2018 1619   HGBUR negative 04/04/2007 1513   BILIRUBINUR NEGATIVE 06/06/2018 1619   BILIRUBINUR negative 12/24/2017 1316   KETONESUR NEGATIVE 06/06/2018 1619   PROTEINUR NEGATIVE 06/06/2018 1619   UROBILINOGEN 0.2 12/24/2017 1316   UROBILINOGEN 0.2 04/08/2015 1242   NITRITE NEGATIVE 06/06/2018 1619   LEUKOCYTESUR NEGATIVE 06/06/2018 1619   Sepsis Labs: @LABRCNTIP (procalcitonin:4,lacticidven:4) )No results found for this or any previous visit (from the past 240 hour(s)).   Radiological Exams on Admission: Ct Angio Chest Pe W Or Wo Contrast  Result Date: 06/06/2018 CLINICAL DATA:  Shortness of breath and tachycardia EXAM: CT ANGIOGRAPHY CHEST WITH CONTRAST TECHNIQUE: Multidetector CT imaging of the chest was performed using the standard protocol during bolus administration of intravenous contrast. Multiplanar CT image reconstructions and MIPs were obtained to evaluate the vascular anatomy. CONTRAST:  69 mL ISOVUE-370 IOPAMIDOL (ISOVUE-370) INJECTION 76% COMPARISON:  CT angiogram chest September 03, 2016 FINDINGS: Cardiovascular: There are incompletely obstructing pulmonary emboli in branches of the right lower lobe pulmonary artery. No more central pulmonary emboli evident. There is no appreciable right heart strain. There is no appreciable thoracic aortic aneurysm or dissection. Visualized great vessels appear unremarkable. There is  no pericardial effusion or pericardial thickening evident. Mediastinum/Nodes: Visualized thyroid appears normal. There is no appreciable thoracic adenopathy. No esophageal lesions are appreciable. Lungs/Pleura: There is atelectatic change in the lower lobe regions. There is no edema or consolidation. No pleural effusion or pleural thickening evident. Upper Abdomen: There is hepatic steatosis.  Visualized upper abdominal structures otherwise appear unremarkable. Musculoskeletal: There are foci of degenerative change in the thoracic spine. There are no appreciable blastic or lytic bone lesions. No chest wall lesions are evident. Review of the MIP images confirms the above findings. IMPRESSION: 1. Foci of incompletely obstructing pulmonary embolus in lower lobe pulmonary artery branches on the right. No more central pulmonary embolus evident. No thoracic aortic aneurysm or dissection. No right heart strain. 2.  Mild bibasilar atelectasis.  No lung edema or consolidation. 3.  No appreciable thoracic adenopathy. 4.  Hepatic steatosis. Critical Value/emergent results were called by telephone at the time of interpretation on 06/06/2018 at 5:29 pm to Dr. Jacalyn Lefevre , who verbally acknowledged these results. Electronically Signed   By: Bretta Bang III M.D.   On: 06/06/2018 17:29     EKG: Independently reviewed.  Sinus rhythm, tachycardia, QTC 442, early R wave progression.   Assessment/Plan Principal Problem:   Pulmonary embolism (HCC) Active Problems:   Gastroesophageal reflux disease   HTN (hypertension)   Spinal stenosis of lumbar region   Migraine   Pulmonary embolism [facility]): CTA of chest showed acute PE without right heart straining evidence.  This is most likely due to provoked PE due to recent lumbar spine surgery.  -Place on tele bed for obs -heparin drip initiated -2D echocardiogram ordered -LE dopplers ordered to evaluate for DVT -pain control -prn xopenex nebx and Mucinex   GERD: -Protonix  HTN:  -Continue home medications: HCTZ -IV hydralazine prn  Hx of Spinal stenosis of lumbar region: s/p of L spin fusion 1 month ago. -pain control and prn gabapentin Robaxin  Migraine headache: -As needed sumatriptan  DVT ppx: on IV Heparin   Code Status: Full code Family Communication:   Yes, patient's husband  at bed side Disposition Plan:  Anticipate discharge  back to previous home environment Consults called:  none Admission status: Obs / tele    Date of Service 06/07/2018    Lorretta Harp [facility] Pager [phone]  If 7PM-7AM, please contact night-coverage [email or web address] Password [email or web address] 06/07/2018, 7:12 AM

## 2018-06-07 ENCOUNTER — Observation Stay (HOSPITAL_BASED_OUTPATIENT_CLINIC_OR_DEPARTMENT_OTHER): Payer: BLUE CROSS/BLUE SHIELD

## 2018-06-07 ENCOUNTER — Ambulatory Visit: Payer: BLUE CROSS/BLUE SHIELD | Admitting: Family Medicine

## 2018-06-07 ENCOUNTER — Other Ambulatory Visit (HOSPITAL_COMMUNITY): Payer: Self-pay

## 2018-06-07 DIAGNOSIS — I2699 Other pulmonary embolism without acute cor pulmonale: Secondary | ICD-10-CM | POA: Diagnosis not present

## 2018-06-07 DIAGNOSIS — F419 Anxiety disorder, unspecified: Secondary | ICD-10-CM | POA: Diagnosis not present

## 2018-06-07 DIAGNOSIS — I2694 Multiple subsegmental pulmonary emboli without acute cor pulmonale: Secondary | ICD-10-CM | POA: Diagnosis not present

## 2018-06-07 DIAGNOSIS — M48061 Spinal stenosis, lumbar region without neurogenic claudication: Secondary | ICD-10-CM | POA: Diagnosis not present

## 2018-06-07 DIAGNOSIS — K760 Fatty (change of) liver, not elsewhere classified: Secondary | ICD-10-CM | POA: Diagnosis not present

## 2018-06-07 DIAGNOSIS — E785 Hyperlipidemia, unspecified: Secondary | ICD-10-CM | POA: Diagnosis not present

## 2018-06-07 DIAGNOSIS — M797 Fibromyalgia: Secondary | ICD-10-CM | POA: Diagnosis not present

## 2018-06-07 DIAGNOSIS — R262 Difficulty in walking, not elsewhere classified: Secondary | ICD-10-CM | POA: Diagnosis not present

## 2018-06-07 DIAGNOSIS — R000 Tachycardia, unspecified: Secondary | ICD-10-CM | POA: Diagnosis not present

## 2018-06-07 DIAGNOSIS — J9601 Acute respiratory failure with hypoxia: Secondary | ICD-10-CM | POA: Diagnosis not present

## 2018-06-07 DIAGNOSIS — I10 Essential (primary) hypertension: Secondary | ICD-10-CM | POA: Diagnosis not present

## 2018-06-07 DIAGNOSIS — R0602 Shortness of breath: Secondary | ICD-10-CM | POA: Diagnosis not present

## 2018-06-07 DIAGNOSIS — G43909 Migraine, unspecified, not intractable, without status migrainosus: Secondary | ICD-10-CM | POA: Diagnosis present

## 2018-06-07 DIAGNOSIS — F329 Major depressive disorder, single episode, unspecified: Secondary | ICD-10-CM | POA: Diagnosis not present

## 2018-06-07 DIAGNOSIS — K219 Gastro-esophageal reflux disease without esophagitis: Secondary | ICD-10-CM | POA: Diagnosis not present

## 2018-06-07 DIAGNOSIS — J9811 Atelectasis: Secondary | ICD-10-CM | POA: Diagnosis not present

## 2018-06-07 DIAGNOSIS — Z6841 Body Mass Index (BMI) 40.0 and over, adult: Secondary | ICD-10-CM | POA: Diagnosis not present

## 2018-06-07 LAB — CBC
HCT: 36.8 % (ref 36.0–46.0)
Hemoglobin: 11.9 g/dL — ABNORMAL LOW (ref 12.0–15.0)
MCH: 28.9 pg (ref 26.0–34.0)
MCHC: 32.3 g/dL (ref 30.0–36.0)
MCV: 89.3 fL (ref 80.0–100.0)
Platelets: 300 10*3/uL (ref 150–400)
RBC: 4.12 MIL/uL (ref 3.87–5.11)
RDW: 12.4 % (ref 11.5–15.5)
WBC: 8.1 10*3/uL (ref 4.0–10.5)
nRBC: 0 % (ref 0.0–0.2)

## 2018-06-07 LAB — BASIC METABOLIC PANEL
Anion gap: 8 (ref 5–15)
BUN: 8 mg/dL (ref 6–20)
CO2: 26 mmol/L (ref 22–32)
Calcium: 8.3 mg/dL — ABNORMAL LOW (ref 8.9–10.3)
Chloride: 106 mmol/L (ref 98–111)
Creatinine, Ser: 1.04 mg/dL — ABNORMAL HIGH (ref 0.44–1.00)
GFR calc Af Amer: >60 mL/min (ref >60)
GFR calc non Af Amer: >60 mL/min (ref >60)
Glucose, Bld: 137 mg/dL — ABNORMAL HIGH (ref 70–99)
Potassium: 3.3 mmol/L — ABNORMAL LOW (ref 3.5–5.1)
Sodium: 140 mmol/L (ref 135–145)

## 2018-06-07 LAB — HIV ANTIBODY (ROUTINE TESTING W REFLEX): HIV Screen 4th Generation wRfx: NONREACTIVE

## 2018-06-07 LAB — ECHOCARDIOGRAM COMPLETE
Height: 69 in
Weight: 4486.8 oz

## 2018-06-07 LAB — HEPARIN LEVEL (UNFRACTIONATED)
Heparin Unfractionated: <0.1 IU/mL — ABNORMAL LOW (ref 0.30–0.70)
Heparin Unfractionated: 0.23 IU/mL — ABNORMAL LOW (ref 0.30–0.70)

## 2018-06-07 MED ORDER — POTASSIUM CHLORIDE CRYS ER 20 MEQ PO TBCR
40.0000 meq | EXTENDED_RELEASE_TABLET | Freq: Once | ORAL | Status: AC
  Administered 2018-06-07: 40 meq via ORAL
  Filled 2018-06-07: qty 2

## 2018-06-07 MED ORDER — HEPARIN BOLUS VIA INFUSION
1000.0000 [IU] | Freq: Once | INTRAVENOUS | Status: AC
  Administered 2018-06-07: 1000 [IU] via INTRAVENOUS
  Filled 2018-06-07: qty 1000

## 2018-06-07 MED ORDER — HYDRALAZINE HCL 20 MG/ML IJ SOLN: 5.0000 mg | INTRAMUSCULAR | Status: DC | PRN

## 2018-06-07 MED ORDER — PANTOPRAZOLE SODIUM 40 MG PO TBEC
80.0000 mg | DELAYED_RELEASE_TABLET | Freq: Every day | ORAL | Status: DC
  Administered 2018-06-07 – 2018-06-08 (×2): 80 mg via ORAL
  Filled 2018-06-07 (×2): qty 2

## 2018-06-07 MED ORDER — OXYCODONE HCL 5 MG PO TABS
15.0000 mg | ORAL_TABLET | ORAL | Status: DC | PRN
  Administered 2018-06-07 – 2018-06-08 (×7): 15 mg via ORAL
  Filled 2018-06-07 (×7): qty 3

## 2018-06-07 MED ORDER — GABAPENTIN 300 MG PO CAPS
300.0000 mg | ORAL_CAPSULE | Freq: Three times a day (TID) | ORAL | Status: DC
  Administered 2018-06-07 – 2018-06-08 (×3): 300 mg via ORAL
  Filled 2018-06-07 (×3): qty 1

## 2018-06-07 MED ORDER — POLYETHYLENE GLYCOL 3350 17 G PO PACK: 17.0000 g | PACK | Freq: Every day | ORAL | Status: DC | PRN

## 2018-06-07 MED ORDER — HEPARIN (PORCINE) 25000 UT/250ML-% IV SOLN
2100.0000 [IU]/h | INTRAVENOUS | Status: AC
  Administered 2018-06-07: 1950 [IU]/h via INTRAVENOUS
  Administered 2018-06-08: 2100 [IU]/h via INTRAVENOUS
  Filled 2018-06-07 (×2): qty 250

## 2018-06-07 MED ORDER — ZOLPIDEM TARTRATE 5 MG PO TABS: 5.0000 mg | ORAL_TABLET | Freq: Every evening | ORAL | Status: DC | PRN

## 2018-06-07 MED ORDER — ACETAMINOPHEN 650 MG RE SUPP: 650.0000 mg | Freq: Four times a day (QID) | RECTAL | Status: DC | PRN

## 2018-06-07 MED ORDER — HEPARIN BOLUS VIA INFUSION
2000.0000 [IU] | Freq: Once | INTRAVENOUS | Status: AC
  Administered 2018-06-07: 2000 [IU] via INTRAVENOUS
  Filled 2018-06-07: qty 2000

## 2018-06-07 MED ORDER — ACETAMINOPHEN 325 MG PO TABS: 650.0000 mg | ORAL_TABLET | Freq: Four times a day (QID) | ORAL | Status: DC | PRN

## 2018-06-07 MED ORDER — MORPHINE SULFATE (PF) 2 MG/ML IV SOLN: 2.0000 mg | INTRAVENOUS | Status: DC | PRN

## 2018-06-07 NOTE — Plan of Care (Signed)
Care plans reviewed and patient is progressing.  

## 2018-06-07 NOTE — Progress Notes (Addendum)
PROGRESS NOTE    [name]  VOJ:[id number] DOB: June 17, 1975 DOA: 06/06/2018 PCP: [name], MD     Brief Narrative:  [name] is a 43 y.o. female with medical history significant of hypertension, hyperlipidemia, GERD, depression, anxiety, fibromyalgia, obesity, migraine headaches, lumbar spinal stenosis (s/p of lumbar fusion surgery 1 month ago), who presents with shortness breath.  On admission, she was diagnosed with pulmonary embolism.  New events last 24 hours / Subjective: On physical exam this morning, she continued to appear conversationally dyspneic, oxygen fell down to the 80s and recovered to the 90s throughout the examination.  She remained tachycardic with heart rate in the 100s.  She denies any chest pain today.  Assessment & Plan:   Principal Problem:   Pulmonary embolism (HCC) Active Problems:   Gastroesophageal reflux disease   HTN (hypertension)   Spinal stenosis of lumbar region   Migraine   Acute pulmonary embolism Echocardiogram RV poorly visualized, EF 60-65%  Venous ultrasound pending  Continue IV heparin  Spinal stenosis status post lumbar fusion Continue home pain medication  Hypokalemia Replace, trend  GERD Continue Protonix  Essential hypertension Continue HCTZ   DVT prophylaxis: IV heparin Code Status: Full code Family Communication: At bedside Disposition Plan: Continue IV heparin until vital signs improved.  Not ready for discharge, she is not at her baseline.  Conversationally dyspneic, oxygen fell down to the 80s even on nasal cannula O2, remains tachycardic. Possible discharge home 1/8 if vital signs improved   Consultants:   None  Procedures:   None   Antimicrobials:  Anti-infectives (From admission, onward)   None        Objective: Vitals:   06/07/18 0700 06/07/18 0732 06/07/18 0900 06/07/18 1100  BP:      Pulse: 94 97 97 97  Resp: 17 18 14 20   Temp:      TempSrc:      SpO2: 99% 98% 98% 95%    Weight:      Height:        Intake/Output Summary (Last 24 hours) at 06/07/2018 1307 Last data filed at 06/07/2018 0300 Gross per 24 hour  Intake 1277.11 ml  Output -  Net 1277.11 ml   Filed Weights   06/06/18 1543 06/06/18 2100  Weight: 126.1 kg 127.2 kg    Examination:  General exam: Appears calm  Respiratory system: Clear to auscultation. Respiratory effort normal, but increasingly conversationally dyspneic throughout exam  Cardiovascular system: S1 & S2 heard, tachycardic, regular rhythm, heart rate in the 100s on my examination No JVD, murmurs, rubs, gallops or clicks. No pedal edema. Gastrointestinal system: Abdomen is nondistended, soft and nontender. No organomegaly or masses felt. Normal bowel sounds heard. Central nervous system: Alert and oriented. No focal neurological deficits. Extremities: Symmetric 5 x 5 power. Skin: No rashes, lesions or ulcers Psychiatry: Judgement and insight appear normal. Mood & affect appropriate.   Data Reviewed: I have personally reviewed following labs and imaging studies  CBC: Recent Labs  Lab 06/06/18 1605 06/07/18 0316  WBC 9.7 8.1  NEUTROABS 5.4  --   HGB 13.2 11.9*  HCT 40.1 36.8  MCV 88.7 89.3  PLT 338 300   Basic Metabolic Panel: Recent Labs  Lab 06/06/18 1605 06/07/18 0316  NA 139 140  K 3.5 3.3*  CL 104 106  CO2 27 26  GLUCOSE 141* 137*  BUN 11 8  CREATININE 1.01* 1.04*  CALCIUM 8.6* 8.3*   GFR: Estimated Creatinine Clearance: 99.8 mL/min (  A) (by C-G formula based on SCr of 1.04 mg/dL (H)). Liver Function Tests: Recent Labs  Lab 06/06/18 1605  AST 27  ALT 19  ALKPHOS 69  BILITOT 0.4  PROT 6.8  ALBUMIN 3.6   No results for input(s): LIPASE, AMYLASE in the last 168 hours. No results for input(s): AMMONIA in the last 168 hours. Coagulation Profile: No results for input(s): INR, PROTIME in the last 168 hours. Cardiac Enzymes: Recent Labs  Lab 06/06/18 1605  TROPONINI <0.03   BNP (last 3  results) No results for input(s): PROBNP in the last 8760 hours. HbA1C: No results for input(s): HGBA1C in the last 72 hours. CBG: No results for input(s): GLUCAP in the last 168 hours. Lipid Profile: No results for input(s): CHOL, HDL, LDLCALC, TRIG, CHOLHDL, LDLDIRECT in the last 72 hours. Thyroid Function Tests: No results for input(s): TSH, T4TOTAL, FREET4, T3FREE, THYROIDAB in the last 72 hours. Anemia Panel: No results for input(s): VITAMINB12, FOLATE, FERRITIN, TIBC, IRON, RETICCTPCT in the last 72 hours. Sepsis Labs: No results for input(s): PROCALCITON, LATICACIDVEN in the last 168 hours.  No results found for this or any previous visit (from the past 240 hour(s)).     Radiology Studies: Ct Angio Chest Pe W Or Wo Contrast  Result Date: 06/06/2018 CLINICAL DATA:  Shortness of breath and tachycardia EXAM: CT ANGIOGRAPHY CHEST WITH CONTRAST TECHNIQUE: Multidetector CT imaging of the chest was performed using the standard protocol during bolus administration of intravenous contrast. Multiplanar CT image reconstructions and MIPs were obtained to evaluate the vascular anatomy. CONTRAST:  69 mL ISOVUE-370 IOPAMIDOL (ISOVUE-370) INJECTION 76% COMPARISON:  CT angiogram chest September 03, 2016 FINDINGS: Cardiovascular: There are incompletely obstructing pulmonary emboli in branches of the right lower lobe pulmonary artery. No more central pulmonary emboli evident. There is no appreciable right heart strain. There is no appreciable thoracic aortic aneurysm or dissection. Visualized great vessels appear unremarkable. There is no pericardial effusion or pericardial thickening evident. Mediastinum/Nodes: Visualized thyroid appears normal. There is no appreciable thoracic adenopathy. No esophageal lesions are appreciable. Lungs/Pleura: There is atelectatic change in the lower lobe regions. There is no edema or consolidation. No pleural effusion or pleural thickening evident. Upper Abdomen: There is  hepatic steatosis. Visualized upper abdominal structures otherwise appear unremarkable. Musculoskeletal: There are foci of degenerative change in the thoracic spine. There are no appreciable blastic or lytic bone lesions. No chest wall lesions are evident. Review of the MIP images confirms the above findings. IMPRESSION: 1. Foci of incompletely obstructing pulmonary embolus in lower lobe pulmonary artery branches on the right. No more central pulmonary embolus evident. No thoracic aortic aneurysm or dissection. No right heart strain. 2.  Mild bibasilar atelectasis.  No lung edema or consolidation. 3.  No appreciable thoracic adenopathy. 4.  Hepatic steatosis. Critical Value/emergent results were called by telephone at the time of interpretation on 06/06/2018 at 5:29 pm to Dr. Jacalyn LefevreJULIE HAVILAND , who verbally acknowledged these results. Electronically Signed   By: Bretta BangWilliam  Woodruff III M.D.   On: 06/06/2018 17:29      Scheduled Meds: . gabapentin  300 mg Oral BID  . hydrochlorothiazide  25 mg Oral Daily  . levalbuterol  1.25 mg Nebulization BID  . multivitamin with minerals  1 tablet Oral Daily  . pantoprazole  80 mg Oral Daily   Continuous Infusions: . heparin 1,750 Units/hr (06/07/18 [id number])     LOS: 1 day    Time spent: 35 minutes   Noralee StainJennifer Christropher Gintz, DO [facility]  [email or web address] Password [email or web address] 06/07/2018, 1:07 PM

## 2018-06-07 NOTE — Progress Notes (Signed)
ANTICOAGULATION CONSULT NOTE   Pharmacy Consult for Heparin Indication: pulmonary embolus  Allergies  Allergen Reactions  . Gadolinium Derivatives Swelling and Other (See Comments)    Arm swelling, tingling, & redness. Radiologist Dr. Grace Isaac recommends an injection of steroids, or 13-hour prep if non-emergent, if patient needs a gadolinium based contrast in the future.  . Other Rash and Other (See Comments)    Glue or tape to seal up wound  . Zoloft [Sertraline Hcl] Other (See Comments)    Hair loss    Patient Measurements: Height: 5\' 9"  (175.3 cm) Weight: 280 lb 6.8 oz (127.2 kg) IBW/kg (Calculated) : 66.2 Heparin Dosing Weight: 95.8 kg  Vital Signs: Temp: 98.1 F (36.7 C) (01/07 0347) Temp Source: Oral (01/07 0347) BP: 111/74 (01/07 0347) Pulse Rate: 93 (01/07 0347)  Labs: Recent Labs    06/06/18 1605 06/07/18 0316 06/07/18 0511  HGB 13.2 11.9*  --   HCT 40.1 36.8  --   PLT 338 300  --   HEPARINUNFRC  --   --  <0.10*  CREATININE 1.01* 1.04*  --   TROPONINI <0.03  --   --     Estimated Creatinine Clearance: 99.8 mL/min (A) (by C-G formula based on SCr of 1.04 mg/dL (H)).   Medical History: Past Medical History:  Diagnosis Date  . Allergy   . Anxiety   . Arthritis   . B12 deficiency   . Depression    not currently   . Dysrhythmia    hx tachy-was from medication nortriptylline  . Esophagitis    rx  . Fibromyalgia   . GERD (gastroesophageal reflux disease)   . History of echocardiogram    Echo 4/18: Mild concentric LVH, vigorous LVF, EF 65-70, normal wall motion, grade 1 diastolic dysfunction  . History of kidney stones   . Hyperlipidemia   . Intervertebral disc protrusion 01/11/2014  . Migraine   . Obesity (BMI 35.0-39.9 without comorbidity)   . Peripheral neuropathy   . Polycystic ovaries   . Spinal stenosis, lumbar   . Vitamin D deficiency     Assessment: 25 YOF who presented to [facility] with SOB and CTA showing PE with no R-heart  strain. The patient was noted to have recent back surgery (anterior lumbar fusion) on 05/04/18.   Baseline CBC wnl, will give a reduced bolus due to recent back surgery.   1/7 AM update: heparin level low, no issues per RN.   Goal of Therapy:  Heparin level 0.3-0.7 units/ml Monitor platelets by anticoagulation protocol: Yes   Plan:  Heparin 2000 units [employer] heparin to 1750 units/hr 1400 HL  Abran Duke, PharmD, BCPS Clinical Pharmacist Phone: [phone]

## 2018-06-07 NOTE — Progress Notes (Signed)
[name] became upset today about not having her gabapentin three times per day.  Information was sent to primary admitting physician and orders were received late this afternoon.  Explanation was given to the patient, that the pharmacy places the medications on a set schedule and we would have to administer them at the scheduled time.  [name] stated that she would take her own medication if we would not give her the Gabapentin now.  Earlier in the day, [name] made a similar statement about taking her own pain medications if she could not have the amount that she had been taking at home.  Discussed with [name] that the hospital policy states that she can not take her own medications in addition to the medications that we are giving, and if she has medications in the room we would need to take them and send them to the pharmacy until she was discharged.  I explained the potential danger in taking additional pain medications with the pain  medications that already have ordered and that she is receiving.  [name] denied having any medications in the room and stated she could get them here if she needed to. [name] was tearful and argumentative during this discussion.  Assurance was given that we would continue to work with her and her physicians to get the correct amount of medications that she needs to be comfortable.

## 2018-06-07 NOTE — Care Management Note (Signed)
Case Management Note  Patient Details  Name: [name] MRN: [id number] Date of Birth: 19-Dec-1975  Subjective/Objective:                    Action/Plan:   Expected Discharge Date:                  Expected Discharge Plan:     In-House Referral:     Discharge planning Services     Post Acute Care Choice:    Choice offered to:     DME Arranged:    DME Agency:     HH Arranged:    HH Agency:     Status of Service:     If discussed at [employer] of Stay Meetings, dates discussed:    Additional Comments:  Per Maureen/ [facility] [phone] both Eliquis and Xarelto are $40.00 30 day supply retail and $80.00 mail order for 90 day supply no prior Lars Mage 06/07/2018, 5:10 PM

## 2018-06-07 NOTE — Progress Notes (Signed)
  Echocardiogram 2D Echocardiogram has been performed.  [name] 06/07/2018, 9:57 AM

## 2018-06-07 NOTE — Progress Notes (Signed)
Bilateral lower extremity venous duplex has been completed. Preliminary results can be found in CV Proc through chart review.   06/07/18 2:50 PM Olen Cordial RVT

## 2018-06-07 NOTE — Progress Notes (Signed)
ANTICOAGULATION CONSULT NOTE   Pharmacy Consult for Heparin Indication: pulmonary embolus  Allergies  Allergen Reactions  . Gadolinium Derivatives Swelling and Other (See Comments)    Arm swelling, tingling, & redness. Radiologist Dr. Grace Isaac recommends an injection of steroids, or 13-hour prep if non-emergent, if patient needs a gadolinium based contrast in the future.  . Other Rash and Other (See Comments)    Glue or tape to seal up wound  . Zoloft [Sertraline Hcl] Other (See Comments)    Hair loss    Patient Measurements: Height: 5\' 9"  (175.3 cm) Weight: 280 lb 6.8 oz (127.2 kg) IBW/kg (Calculated) : 66.2 Heparin Dosing Weight: 95.8 kg  Vital Signs: Temp: 98.6 F (37 C) (01/07 0800) Temp Source: Oral (01/07 0347) BP: 111/74 (01/07 0347) Pulse Rate: 97 (01/07 1100)  Labs: Recent Labs    06/06/18 1605 06/07/18 0316 06/07/18 0511 06/07/18 1326  HGB 13.2 11.9*  --   --   HCT 40.1 36.8  --   --   PLT 338 300  --   --   HEPARINUNFRC  --   --  <0.10* 0.23*  CREATININE 1.01* 1.04*  --   --   TROPONINI <0.03  --   --   --     Estimated Creatinine Clearance: 99.8 mL/min (A) (by C-G formula based on SCr of 1.04 mg/dL (H)).   Medical History: Past Medical History:  Diagnosis Date  . Allergy   . Anxiety   . Arthritis   . B12 deficiency   . Depression    not currently   . Dysrhythmia    hx tachy-was from medication nortriptylline  . Esophagitis    rx  . Fibromyalgia   . GERD (gastroesophageal reflux disease)   . History of echocardiogram    Echo 4/18: Mild concentric LVH, vigorous LVF, EF 65-70, normal wall motion, grade 1 diastolic dysfunction  . History of kidney stones   . Hyperlipidemia   . Intervertebral disc protrusion 01/11/2014  . Migraine   . Obesity (BMI 35.0-39.9 without comorbidity)   . Peripheral neuropathy   . Polycystic ovaries   . Spinal stenosis, lumbar   . Vitamin D deficiency     Assessment: 8 YOF who presented [facility]  with SOB and CTA showing PE with no R-heart strain. The patient was noted to have recent back surgery (anterior lumbar fusion) on 05/04/18.  -heparin level= 0.22 on 1750 units/hr    Goal of Therapy:  Heparin level 0.3-0.7 units/ml Monitor platelets by anticoagulation protocol: Yes   Plan:  Heparin 1000 units bolus Increase heparin to 1950 units/hr Heparin level in 6 hours and daily wth CBC daily  Harland German, PharmD Clinical Pharmacist **Pharmacist phone directory can now be found on [email or web address] ([email or web address]).  Listed under [facility].

## 2018-06-07 NOTE — Progress Notes (Signed)
Pt arrived from [facility] to 4e02. A&Ox4. CHG bath given. Telemetry placed on box [id number]. CCMD notified. VSS. Complains of back and abdomen pain from surgery. On 2L O2 [place]. MD notified that pt has arrived. Husband at bedside. Call bell within reach. Will monitor.   Judithann Sheen, RN

## 2018-06-08 DIAGNOSIS — Z6841 Body Mass Index (BMI) 40.0 and over, adult: Secondary | ICD-10-CM | POA: Diagnosis not present

## 2018-06-08 DIAGNOSIS — I10 Essential (primary) hypertension: Secondary | ICD-10-CM | POA: Diagnosis not present

## 2018-06-08 DIAGNOSIS — E785 Hyperlipidemia, unspecified: Secondary | ICD-10-CM | POA: Diagnosis not present

## 2018-06-08 DIAGNOSIS — K219 Gastro-esophageal reflux disease without esophagitis: Secondary | ICD-10-CM | POA: Diagnosis not present

## 2018-06-08 DIAGNOSIS — J9811 Atelectasis: Secondary | ICD-10-CM | POA: Diagnosis not present

## 2018-06-08 DIAGNOSIS — J9601 Acute respiratory failure with hypoxia: Secondary | ICD-10-CM | POA: Diagnosis not present

## 2018-06-08 DIAGNOSIS — R262 Difficulty in walking, not elsewhere classified: Secondary | ICD-10-CM | POA: Diagnosis not present

## 2018-06-08 DIAGNOSIS — R000 Tachycardia, unspecified: Secondary | ICD-10-CM | POA: Diagnosis not present

## 2018-06-08 DIAGNOSIS — F329 Major depressive disorder, single episode, unspecified: Secondary | ICD-10-CM | POA: Diagnosis not present

## 2018-06-08 DIAGNOSIS — I2694 Multiple subsegmental pulmonary emboli without acute cor pulmonale: Secondary | ICD-10-CM | POA: Diagnosis not present

## 2018-06-08 DIAGNOSIS — M797 Fibromyalgia: Secondary | ICD-10-CM | POA: Diagnosis not present

## 2018-06-08 DIAGNOSIS — G43909 Migraine, unspecified, not intractable, without status migrainosus: Secondary | ICD-10-CM | POA: Diagnosis not present

## 2018-06-08 DIAGNOSIS — F419 Anxiety disorder, unspecified: Secondary | ICD-10-CM | POA: Diagnosis not present

## 2018-06-08 DIAGNOSIS — K760 Fatty (change of) liver, not elsewhere classified: Secondary | ICD-10-CM | POA: Diagnosis not present

## 2018-06-08 DIAGNOSIS — M48061 Spinal stenosis, lumbar region without neurogenic claudication: Secondary | ICD-10-CM | POA: Diagnosis not present

## 2018-06-08 LAB — CBC
HCT: 36.5 % (ref 36.0–46.0)
Hemoglobin: 12.2 g/dL (ref 12.0–15.0)
MCH: 29.7 pg (ref 26.0–34.0)
MCHC: 33.4 g/dL (ref 30.0–36.0)
MCV: 88.8 fL (ref 80.0–100.0)
Platelets: 322 10*3/uL (ref 150–400)
RBC: 4.11 MIL/uL (ref 3.87–5.11)
RDW: 12.3 % (ref 11.5–15.5)
WBC: 8.2 10*3/uL (ref 4.0–10.5)
nRBC: 0 % (ref 0.0–0.2)

## 2018-06-08 LAB — BASIC METABOLIC PANEL
Anion gap: 7 (ref 5–15)
BUN: 6 mg/dL (ref 6–20)
CO2: 30 mmol/L (ref 22–32)
Calcium: 8.5 mg/dL — ABNORMAL LOW (ref 8.9–10.3)
Chloride: 102 mmol/L (ref 98–111)
Creatinine, Ser: 1.18 mg/dL — ABNORMAL HIGH (ref 0.44–1.00)
GFR calc Af Amer: >60 mL/min (ref >60)
GFR calc non Af Amer: 56 mL/min — ABNORMAL LOW (ref >60)
Glucose, Bld: 109 mg/dL — ABNORMAL HIGH (ref 70–99)
Potassium: 3.8 mmol/L (ref 3.5–5.1)
Sodium: 139 mmol/L (ref 135–145)

## 2018-06-08 LAB — HEPARIN LEVEL (UNFRACTIONATED)
Heparin Unfractionated: 0.26 IU/mL — ABNORMAL LOW (ref 0.30–0.70)
Heparin Unfractionated: 0.36 IU/mL (ref 0.30–0.70)

## 2018-06-08 MED ORDER — PANTOPRAZOLE SODIUM 40 MG PO TBEC: 40.0000 mg | DELAYED_RELEASE_TABLET | Freq: Every day | ORAL | Status: DC

## 2018-06-08 MED ORDER — RIVAROXABAN 15 MG PO TABS
15.0000 mg | ORAL_TABLET | Freq: Two times a day (BID) | ORAL | Status: DC
  Filled 2018-06-08: qty 1

## 2018-06-08 MED ORDER — RIVAROXABAN (XARELTO) VTE STARTER PACK (15 & 20 MG): ORAL_TABLET | ORAL | 0 refills | Status: DC

## 2018-06-08 MED ORDER — RIVAROXABAN 15 MG PO TABS
15.0000 mg | ORAL_TABLET | Freq: Two times a day (BID) | ORAL | Status: DC
  Administered 2018-06-08: 15 mg via ORAL

## 2018-06-08 MED ORDER — RIVAROXABAN 20 MG PO TABS: 20.0000 mg | ORAL_TABLET | Freq: Every day | ORAL | Status: DC

## 2018-06-08 MED FILL — XARELTO STARTER PACK: 15 & 20 | 30 days supply | Qty: 51 | Fill #0

## 2018-06-08 NOTE — Progress Notes (Signed)
ANTICOAGULATION CONSULT NOTE   Pharmacy Consult for Heparin Indication: pulmonary embolus  Allergies  Allergen Reactions  . Gadolinium Derivatives Swelling and Other (See Comments)    Arm swelling, tingling, & redness. Radiologist Dr. Grace Isaac recommends an injection of steroids, or 13-hour prep if non-emergent, if patient needs a gadolinium based contrast in the future.  . Other Rash and Other (See Comments)    Glue or tape to seal up wound  . Zoloft [Sertraline Hcl] Other (See Comments)    Hair loss    Patient Measurements: Height: 5\' 9"  (175.3 cm) Weight: 280 lb 6.8 oz (127.2 kg) IBW/kg (Calculated) : 66.2 Heparin Dosing Weight: 95.8 kg  Vital Signs: Temp: 98.6 F (37 C) (01/07 2334) Temp Source: Oral (01/07 2334) BP: 125/90 (01/07 2334) Pulse Rate: 88 (01/07 2334)  Labs: Recent Labs    06/06/18 1605 06/07/18 0316 06/07/18 0511 06/07/18 1326 06/07/18 2345  HGB 13.2 11.9*  --   --   --   HCT 40.1 36.8  --   --   --   PLT 338 300  --   --   --   HEPARINUNFRC  --   --  <0.10* 0.23* 0.26*  CREATININE 1.01* 1.04*  --   --   --   TROPONINI <0.03  --   --   --   --     Estimated Creatinine Clearance: 99.8 mL/min (A) (by C-G formula based on SCr of 1.04 mg/dL (H)).   Medical History: Past Medical History:  Diagnosis Date  . Allergy   . Anxiety   . Arthritis   . B12 deficiency   . Depression    not currently   . Dysrhythmia    hx tachy-was from medication nortriptylline  . Esophagitis    rx  . Fibromyalgia   . GERD (gastroesophageal reflux disease)   . History of echocardiogram    Echo 4/18: Mild concentric LVH, vigorous LVF, EF 65-70, normal wall motion, grade 1 diastolic dysfunction  . History of kidney stones   . Hyperlipidemia   . Intervertebral disc protrusion 01/11/2014  . Migraine   . Obesity (BMI 35.0-39.9 without comorbidity)   . Peripheral neuropathy   . Polycystic ovaries   . Spinal stenosis, lumbar   . Vitamin D deficiency      Assessment: 49 YOF who presented [facility] with SOB and CTA showing PE with no R-heart strain. The patient was noted to have recent back surgery (anterior lumbar fusion) on 05/04/18.  Heparin level 0.26 units/ml  Goal of Therapy:  Heparin level 0.3-0.7 units/ml Monitor platelets by anticoagulation protocol: Yes   Plan:  Increase heparin 2100 units/hr Heparin level in 6 hours and daily wth CBC daily  Thanks for allowing pharmacy to be a part of this patient's care.  Talbert Cage, PharmD Clinical Pharmacist

## 2018-06-08 NOTE — Evaluation (Signed)
Physical Therapy Evaluation Patient Details Name: [name] MRN: [id number] DOB: 1975-10-23 Today's Date: 06/08/2018   History of Present Illness  43 year old female with long-standing history with her back previous undergone L3-L5 fusion. s/p  PLIF 12/4.  Now admitted for PE noted by home PT, due to SOB with lying supine.   PMH: anxiety, fibromyalgia,  multiple back surgeries, spinal stenosis lumbar spine, migraines,   Clinical Impression  Pt was seen for mobility after her O2 was removed from nasal cannula, and was at 94% sat at rest.  After walking was 95% and went higher to 99% with rest off cannula, so left off pt and informed nursing.  Pt is using correct body mechanics with pivot to get off bed since she is in a fully upright posture, and discouraged her from sitting unsupported on side of bed due to back pain being ongoing.  Follow acutely as needed to walk and work as tolerated on gentle LE ROM within her back precautions.  Pt is recommended to continue at home after hosp and will reinforce precautions, monitor O2 sats as are appropriate.    Follow Up Recommendations Home health PT;Supervision for mobility/OOB    Equipment Recommendations  None recommended by PT    Recommendations for Other Services       Precautions / Restrictions Precautions Precautions: Back;Fall Required Braces or Orthoses: Spinal Brace Spinal Brace: Applied in sitting position;Lumbar corset Restrictions Weight Bearing Restrictions: No      Mobility  Bed Mobility Overal bed mobility: Needs Assistance Bed Mobility: Rolling(sits upright with pillows and pivots to EOB) Rolling: Supervision         General bed mobility comments: using bed rail from sitting to slide legs to the floor and steady bedside, light headed initially  Transfers Overall transfer level: Needs assistance Equipment used: Rolling walker (2 wheeled);1 person hand held assist Transfers: Sit to/from Stand Sit to Stand: Min  guard         General transfer comment: min guard for safety and to monitor her ability to control initial standing balance  Ambulation/Gait Ambulation/Gait assistance: Min guard Gait Distance (Feet): 80 Feet Assistive device: Rolling walker (2 wheeled);1 person hand held assist Gait Pattern/deviations: Step-through pattern;Decreased stride length;Wide base of support(upright posture wiht RW support) Gait velocity: reduced Gait velocity interpretation: <1.31 ft/sec, indicative of household ambulator    Stairs Stairs: (pt declined as she is in a one plus one situation)          Wheelchair Mobility    Modified Rankin (Stroke Patients Only)       Balance Overall balance assessment: Needs assistance;History of Falls Sitting-balance support: Feet supported Sitting balance-Leahy Scale: Good     Standing balance support: Bilateral upper extremity supported;During functional activity Standing balance-Leahy Scale: Fair                               Pertinent Vitals/Pain Pain Assessment: 0-10 Pain Score: 9  Pain Location: back during walk Pain Descriptors / Indicators: Operative site guarding Pain Intervention(s): Limited activity within patient's tolerance;Premedicated before session    [place] expects to be discharged to:: Private residence Living Arrangements: Spouse/significant other Available Help at Discharge: Family;Available 24 hours/day Type of Home: House Home Access: Stairs to enter Entrance Stairs-Rails: None Entrance Stairs-Number of Steps: 1+1 Home Layout: One level Home Equipment: Walker - 2 wheels;Tub bench;Adaptive equipment;Hand held shower head Additional Comments: Adjustable bed    Prior Function  Level of Independence: Needs assistance   Gait / Transfers Assistance Needed: RW in her home  ADL's / Homemaking Assistance Needed: I with ADLS, husband helped with socks and shoes and shaving legs        Hand  Dominance   Dominant Hand: Right    Extremity/Trunk Assessment   Upper Extremity Assessment Upper Extremity Assessment: Overall WFL for tasks assessed    Lower Extremity Assessment Lower Extremity Assessment: Overall WFL for tasks assessed    Cervical / Trunk Assessment Cervical / Trunk Assessment: Other exceptions(new revised lumbar fusion with  anterior and post approach )  Communication   Communication: No difficulties  Cognition Arousal/Alertness: Awake/alert Behavior During Therapy: Anxious Overall Cognitive Status: Within Functional Limits for tasks assessed                                 General Comments: pt is very worried about the requirements for dc home, discussion with PT about whether she has met them      General Comments General comments (skin integrity, edema, etc.): pt was able to maintain O2 sats with no supplemental O2, at 95% during walk    Exercises     Assessment/Plan    PT Assessment Patient needs continued PT services  PT Problem List Decreased range of motion;Decreased activity tolerance;Decreased balance;Decreased mobility;Decreased safety awareness;Cardiopulmonary status limiting activity;Decreased skin integrity;Pain       PT Treatment Interventions DME instruction;Gait training;Stair training;Functional mobility training;Therapeutic activities;Therapeutic exercise;Balance training;Neuromuscular re-education;Patient/family education    PT Goals (Current goals can be found in the Care Plan section)  Acute Rehab PT Goals Patient Stated Goal: to walk on the hall with no O2 sat drops PT Goal Formulation: With patient Time For Goal Achievement: 06/15/18 Potential to Achieve Goals: Good    Frequency Min 3X/week   Barriers to discharge Other (comment)(has husband at home with stairs being one at a time)      Co-evaluation               AM-PAC PT "6 Clicks" Mobility  Outcome Measure Help needed turning from your back  to your side while in a flat bed without using bedrails?: None Help needed moving from lying on your back to sitting on the side of a flat bed without using bedrails?: A Little Help needed moving to and from a bed to a chair (including a wheelchair)?: A Little Help needed standing up from a chair using your arms (e.g., wheelchair or bedside chair)?: A Little Help needed to walk in hospital room?: A Little Help needed climbing 3-5 steps with a railing? : A Lot 6 Click Score: 18    End of Session Equipment Utilized During Treatment: Gait belt Activity Tolerance: Patient tolerated treatment well;Patient limited by fatigue;Other (comment)(asking for meds after gait) Patient left: in bed;with call bell/phone within reach Nurse Communication: Mobility status;Patient requests pain meds PT Visit Diagnosis: Pain;Difficulty in walking, not elsewhere classified (R26.2);Other (comment)(back precautions) Pain - Right/Left: (Bilateral) Pain - part of body: (back and upper thighs)    Time: 1125-1206 PT Time Calculation (min) (ACUTE ONLY): 41 min   Charges:   PT Evaluation $PT Eval Moderate Complexity: 1 Mod PT Treatments $Gait Training: 8-22 mins $Therapeutic Activity: 8-22 mins       Ramond Dial 06/08/2018, 12:52 PM   Mee Hives, PT MS Acute Rehab Dept. Number: [place] and [place]

## 2018-06-08 NOTE — Progress Notes (Signed)
ANTICOAGULATION CONSULT NOTE   Pharmacy Consult for Heparin Indication: pulmonary embolus  Allergies  Allergen Reactions  . Gadolinium Derivatives Swelling and Other (See Comments)    Arm swelling, tingling, & redness. Radiologist Dr. Grace IsaacWatts recommends an injection of steroids, or 13-hour prep if non-emergent, if patient needs a gadolinium based contrast in the future.  . Other Rash and Other (See Comments)    Glue or tape to seal up wound  . Zoloft [Sertraline Hcl] Other (See Comments)    Hair loss    Patient Measurements: Height: 5\' 9"  (175.3 cm) Weight: 280 lb 6.8 oz (127.2 kg) IBW/kg (Calculated) : 66.2 Heparin Dosing Weight: 95.8 kg  Vital Signs: Temp: 98.1 F (36.7 C) (01/08 0845) Temp Source: Oral (01/08 0845) BP: 130/81 (01/08 0845) Pulse Rate: 98 (01/08 0845)  Labs: Recent Labs    06/06/18 1605 06/07/18 0316  06/07/18 1326 06/07/18 2345 06/08/18 0753  HGB 13.2 11.9*  --   --   --  12.2  HCT 40.1 36.8  --   --   --  36.5  PLT 338 300  --   --   --  322  HEPARINUNFRC  --   --    < > 0.23* 0.26* 0.36  CREATININE 1.01* 1.04*  --   --   --  1.18*  TROPONINI <0.03  --   --   --   --   --    < > = values in this interval not displayed.    Estimated Creatinine Clearance: 87.9 mL/min (A) (by C-G formula based on SCr of 1.18 mg/dL (H)).   Medical History: Past Medical History:  Diagnosis Date  . Allergy   . Anxiety   . Arthritis   . B12 deficiency   . Depression    not currently   . Dysrhythmia    hx tachy-was from medication nortriptylline  . Esophagitis    rx  . Fibromyalgia   . GERD (gastroesophageal reflux disease)   . History of echocardiogram    Echo 4/18: Mild concentric LVH, vigorous LVF, EF 65-70, normal wall motion, grade 1 diastolic dysfunction  . History of kidney stones   . Hyperlipidemia   . Intervertebral disc protrusion 01/11/2014  . Migraine   . Obesity (BMI 35.0-39.9 without comorbidity)   . Peripheral neuropathy   . Polycystic  ovaries   . Spinal stenosis, lumbar   . Vitamin D deficiency     Assessment: [age] YOF who presented [facility] with SOB and CTA showing PE with no R-heart strain. The patient was noted to have recent back surgery (anterior lumbar fusion) on 05/04/18.  -heparin level= 0.36 on 2100 units/hr    Goal of Therapy:  Heparin level 0.3-0.7 units/ml Monitor platelets by anticoagulation protocol: Yes   Plan:  -No heparin changes needed -Daily heparin level and CBC -Will follow anticoagulation plans  Harland GermanAndrew Adriann Ballweg, PharmD Clinical Pharmacist **Pharmacist phone directory can now be found on [email or web address] ([email or web address]).  Listed under [facility].

## 2018-06-08 NOTE — Discharge Instructions (Addendum)
Please get your medications reviewed and adjusted by your Primary MD. ° °Please request your Primary MD to go over all Hospital Tests and Procedure/Radiological results at the follow up, please get all Hospital records sent to your Prim MD by signing hospital release before you go home. ° °If you had Pneumonia of Lung problems at the Hospital: °Please get a 2 view Chest X ray done in 6-8 weeks after hospital discharge or sooner if instructed by your Primary MD. ° °If you have Congestive Heart Failure: °Please call your Cardiologist or Primary MD anytime you have any of the following symptoms:  °1) 3 pound weight gain in 24 hours or 5 pounds in 1 week  °2) shortness of breath, with or without a dry hacking cough  °3) swelling in the hands, feet or stomach  °4) if you have to sleep on extra pillows at night in order to breathe ° °Follow cardiac low salt diet and 1.5 lit/day fluid restriction. ° °If you have diabetes °Accuchecks 4 times/day, Once in AM empty stomach and then before each meal. °Log in all results and show them to your primary doctor at your next visit. °If any glucose reading is under 80 or above 300 call your primary MD immediately. ° °If you have Seizure/Convulsions/Epilepsy: °Please do not drive, operate heavy machinery, participate in activities at heights or participate in high speed sports until you have seen by Primary MD or a Neurologist and advised to do so again. ° °If you had Gastrointestinal Bleeding: °Please ask your Primary MD to check a complete blood count within one week of discharge or at your next visit. Your endoscopic/colonoscopic biopsies that are pending at the time of discharge, will also need to followed by your Primary MD. ° °Get Medicines reviewed and adjusted. °Please take all your medications with you for your next visit with your Primary MD ° °Please request your Primary MD to go over all hospital tests and procedure/radiological results at the follow up, please ask your  Primary MD to get all Hospital records sent to his/her office. ° °If you experience worsening of your admission symptoms, develop shortness of breath, life threatening emergency, suicidal or homicidal thoughts you must seek medical attention immediately by calling 911 or calling your MD immediately  if symptoms less severe. ° °You must read complete instructions/literature along with all the possible adverse reactions/side effects for all the Medicines you take and that have been prescribed to you. Take any new Medicines after you have completely understood and accpet all the possible adverse reactions/side effects.  ° °Do not drive or operate heavy machinery when taking Pain medications.  ° °Do not take more than prescribed Pain, Sleep and Anxiety Medications ° °Special Instructions: If you have smoked or chewed Tobacco  in the last 2 yrs please stop smoking, stop any regular Alcohol  and or any Recreational drug use. ° °Wear Seat belts while driving. ° °Please note °You were cared for by a hospitalist during your hospital stay. If you have any questions about your discharge medications or the care you received while you were in the hospital after you are discharged, you can call the unit and asked to speak with the hospitalist on call if the hospitalist that took care of you is not available. Once you are discharged, your primary care physician will handle any further medical issues. Please note that NO REFILLS for any discharge medications will be authorized once you are discharged, as it is imperative that you   return to your primary care physician (or establish a relationship with a primary care physician if you do not have one) for your aftercare needs so that they can reassess your need for medications and monitor your lab values.  You can reach the hospitalist office at phone [phone] or fax [phone]   If you do not have a primary care physician, you can call [phone] for a physician  referral.  - - - - - - - - - - - - - - - - - - - - - - - - - - - - - - - - - - - - - - - - - - - - - - - - - - - - - - - - - - - - - - - - - - - - - - - - - - - - - - - - - - - - - - - - Information on my medicine - XARELTO (rivaroxaban)  WHY WAS XARELTO PRESCRIBED FOR YOU? Xarelto was prescribed to treat blood clots that may have been found in the veins of your legs (deep vein thrombosis) or in your lungs (pulmonary embolism) and to reduce the risk of them occurring again.  What do you need to know about Xarelto? The starting dose is one 15 mg tablet taken TWICE daily with food for the FIRST 21 DAYS then on 06/29/2018  the dose is changed to one 20 mg tablet taken ONCE A DAY with your evening meal.  DO NOT stop taking Xarelto without talking to the health care provider who prescribed the medication.  Refill your prescription for 20 mg tablets before you run out.  After discharge, you should have regular check-up appointments with your healthcare provider that is prescribing your Xarelto.  In the future your dose may need to be changed if your kidney function changes by a significant amount.  What do you do if you miss a dose? If you are taking Xarelto TWICE DAILY and you miss a dose, take it as soon as you remember. You may take two 15 mg tablets (total 30 mg) at the same time then resume your regularly scheduled 15 mg twice daily the next day.  If you are taking Xarelto ONCE DAILY and you miss a dose, take it as soon as you remember on the same day then continue your regularly scheduled once daily regimen the next day. Do not take two doses of Xarelto at the same time.   Important Safety Information Xarelto is a blood thinner medicine that can cause bleeding. You should call your healthcare provider right away if you experience any of the following: ? Bleeding from an injury or your nose that does not stop. ? Unusual colored urine (red or dark brown) or unusual colored stools (red  or black). ? Unusual bruising for unknown reasons. ? A serious fall or if you hit your head (even if there is no bleeding).  Some medicines may interact with Xarelto and might increase your risk of bleeding while on Xarelto. To help avoid this, consult your healthcare provider or pharmacist prior to using any new prescription or non-prescription medications, including herbals, vitamins, non-steroidal anti-inflammatory drugs (NSAIDs) and supplements.  This website has more information on Xarelto: VisitDestination.com.br.

## 2018-06-08 NOTE — Progress Notes (Signed)
Into discharge patient, patient c/o chest tightness. Patient appeared anxious. EKG done. BP 121/85, HR 98 and O2 99%. Dr. Waymon Amato notified. No new orders. Ok to discharge patient.  Ernestina Columbia, RN

## 2018-06-08 NOTE — Discharge Summary (Signed)
Physician Discharge Summary  [name] CWC:[id number] DOB: 20-Nov-1975  PCP: Laurey Morale, MD  Admit date: 06/06/2018 Discharge date: 06/08/2018  Recommendations for Outpatient Follow-up:  1. Dr. Alysia Penna, PCP in 1 week with repeat labs (CBC & BMP). 2. Dr. Kary Kos, Neurosurgery.   3. Consider outpatient hematology consultation to evaluate for hypercoagulable status for her PE. 4. Please reassess opioid polypharmacy during outpatient visit with PCP.  Home Health: Continue prior home health PT, patient was active with them. Equipment/Devices: None  Discharge Condition: Improved and stable CODE STATUS: Full Diet recommendation: Heart healthy diet.  Discharge Diagnoses:  Principal Problem:   Pulmonary embolism (HCC) Active Problems:   Gastroesophageal reflux disease   HTN (hypertension)   Spinal stenosis of lumbar region   Migraine   Brief Summary: Hannah P Bickaris a 43 y.o.femalewith medical history significant ofhypertension, hyperlipidemia, GERD, depression, anxiety, fibromyalgia, obesity, migraine headaches, lumbar spinal stenosis (s/p oflumbar fusion surgery 1 month ago, on 12/4), who presents with shortness breath.  On admission, she was diagnosed with pulmonary embolism.  Assessment & Plan:   Acute pulmonary embolism - Echocardiogram RV poorly visualized, EF 60-65%  - Bilateral lower extremity venous Dopplers negative for PE. - CTA chest showed right lower lobe PE without right heart strain. - She was started on IV heparin and has been on it since 1/6, almost 48 hours completed. - Etiology of her PE may be related to relative immobility over the last 2 years from lumbar spinal stenosis and associated chronic pain but more so after recent prolonged surgery (states that she had almost an hour surgery) and recuperation.  Patient reports that she has not been moving around much and has been mostly sedentary.  She however reports history of DVT in her mother.   Thereby recommend considering outpatient hematology consultation to consider evaluation for hypercoagulable status. - I discussed in detail with both patient and spouse at bedside regarding options of oral anticoagulation including warfarin, Eliquis & Xarelto and Pradaxa including and not limited to benefits, risks and side effects.  Subsequently pharmacist also met with them.  Patient eventually opted for Xarelto.  IV heparin infusion stopped, patient will receive her first dose of Xarelto prior to discharge.  Xarelto will be provided to patient via the [facility] pharmacy. - Duration of anticoagulation: Minimum of 3 months but may be longer if she has persistent risk factors which would include relative immobility due to her back pain issues or if hypercoagulable status is discovered.  Acute respiratory failure with hypoxia Secondary to acute pulmonary embolism and associated atelectasis.  Resolved.  As per PT input, patient ambulated without hypoxia.  Resolved.  Spinal stenosis status post lumbar fusion I discussed with Dr. Saintclair Halsted, Neurosurgery on day of discharge, updated him regarding her hospitalization and he has cleared her to continue with anticoagulation discussed above. Of note patient is on 3 different opioid pain medications as noted below.  Patient reports that she takes all of them but Dilaudid infrequently for breakthrough pain.  She was advised regarding the multiple side effects of opioid polypharmacy and to closely follow-up with her PCP to rationalize and minimize if possible.  She verbalized understanding.  Hypokalemia Replaced.  GERD Continue Protonix  Essential hypertension Continue HCTZ along with potassium supplements to avoid hypokalemia.  Chronic pain Please see discussion above regarding pain medications.  Consultants:   None  Procedures:   None    Discharge Instructions  Discharge Instructions    Call MD  for:  difficulty breathing,  headache or visual disturbances   Complete by:  As directed    Call MD for:  extreme fatigue   Complete by:  As directed    Call MD for:  persistant dizziness or light-headedness   Complete by:  As directed    Call MD for:  severe uncontrolled pain   Complete by:  As directed    Diet - low sodium heart healthy   Complete by:  As directed    Increase activity slowly   Complete by:  As directed        Medication List    STOP taking these medications   methocarbamol 750 MG tablet Commonly known as:  ROBAXIN     TAKE these medications   ALPRAZolam 0.5 MG tablet Commonly known as:  XANAX TAKE 1 TABLET BY MOUTH THREE TIMES A DAY AS NEEDED What changed:  reasons to take this   calcium carbonate 500 MG chewable tablet Commonly known as:  TUMS - dosed in mg elemental calcium Chew 1 tablet by mouth 2 (two) times daily as needed for indigestion or heartburn.   cetirizine 10 MG tablet Commonly known as:  ZYRTEC Take 10 mg by mouth daily.   cyanocobalamin 1000 MCG/ML injection Commonly known as:  (VITAMIN B-12) INJECT 1 ML (1,000 MCG TOTAL) INTO THE MUSCLE EVERY 7 DAYS. What changed:  See the new instructions.   cyclobenzaprine 10 MG tablet Commonly known as:  FLEXERIL Take 10 mg by mouth 3 (three) times daily as needed for muscle spasms.   fluticasone 50 MCG/ACT nasal spray Commonly known as:  FLONASE Place 2 sprays into both nostrils daily.   gabapentin 300 MG capsule Commonly known as:  NEURONTIN Take 1 capsule (300 mg total) by mouth 3 (three) times daily.   hydrochlorothiazide 25 MG tablet Commonly known as:  HYDRODIURIL TAKE 1 TABLET BY MOUTH EVERY DAY   HYDROmorphone 2 MG tablet Commonly known as:  DILAUDID Take 0.5 tablets (1 mg total) by mouth every 6 (six) hours as needed for severe pain.   MULTI-VITAMIN GUMMIES PO Take 2 tablets by mouth daily.   nitrofurantoin (macrocrystal-monohydrate) 100 MG capsule Commonly known as:  MACROBID Take 100 mg by mouth  daily as needed (for uti prophylaxis, after intercourse).   oxyCODONE 15 MG immediate release tablet Commonly known as:  ROXICODONE Take 1 tablet (15 mg total) by mouth every 4 (four) hours as needed for pain.   oxyCODONE-acetaminophen 10-325 MG tablet Commonly known as:  PERCOCET Take 1 tablet by mouth every 4 (four) hours as needed for pain.   pantoprazole 40 MG tablet Commonly known as:  PROTONIX Take 1 tablet (40 mg total) by mouth daily. What changed:  See the new instructions.   potassium chloride 10 MEQ tablet Commonly known as:  KLOR-CON 10 Take 1 tablet (10 mEq total) by mouth daily. What changed:    when to take this  reasons to take this   promethazine 25 MG tablet Commonly known as:  PHENERGAN TAKE 1 TABLET BY MOUTH EVERY 6 HOURS AS NEEDED FOR NAUSEA What changed:    reasons to take this  additional instructions   Rivaroxaban 15 & 20 MG Tbpk Take as directed on package: Start with one '15mg'$  tablet by mouth twice a day with food. On Day 22, switch to one '20mg'$  tablet once a day with food. Pharmacy: Please discard first tablet from the pack which she has received in the hospital.   simethicone 80 MG  chewable tablet Commonly known as:  MYLICON Chew 921 mg by mouth every 6 (six) hours as needed (gas pain).   SUMAtriptan 100 MG tablet Commonly known as:  IMITREX Take 1 tablet earliest onset of migraine.  May repeat once in 2 hours if headache persists or recurs.   SYRINGE-NEEDLE (DISP) 3 ML 25G X 1" 3 ML Misc Commonly known as:  B-D 3CC LUER-LOK SYR 25GX1" USE AS DIRECTED   SYRINGE-NEEDLE (DISP) 3 ML 25G X 1" 3 ML Misc Commonly known as:  B-D 3CC LUER-LOK SYR 25GX1" USE AS DIRECTED   Vitamin D (Ergocalciferol) 1.25 MG (50000 UT) Caps capsule Commonly known as:  DRISDOL TAKE 1 CAPSULE (50,000 UNITS TOTAL) BY MOUTH EVERY 7 (SEVEN) DAYS.      Follow-up Information    Laurey Morale, MD. Schedule an appointment as soon as possible for a visit in 1 week(s).    Specialty:  Family Medicine Why:  To be seen with repeat labs (CBC & BMP). Contact information: [place] [address] [phone]        Kary Kos, MD. Schedule an appointment as soon as possible for a visit.   Specialty:  Neurosurgery Contact information: [address] [place] [address] [phone]          Allergies  Allergen Reactions  . Gadolinium Derivatives Swelling and Other (See Comments)    Arm swelling, tingling, & redness. Radiologist Dr. Pascal Lux recommends an injection of steroids, or 13-hour prep if non-emergent, if patient needs a gadolinium based contrast in the future.  . Other Rash and Other (See Comments)    Glue or tape to seal up wound  . Zoloft [Sertraline Hcl] Other (See Comments)    Hair loss      Procedures/Studies: Ct Angio Chest Pe W Or Wo Contrast  Result Date: 06/06/2018 CLINICAL DATA:  Shortness of breath and tachycardia EXAM: CT ANGIOGRAPHY CHEST WITH CONTRAST TECHNIQUE: Multidetector CT imaging of the chest was performed using the standard protocol during bolus administration of intravenous contrast. Multiplanar CT image reconstructions and MIPs were obtained to evaluate the vascular anatomy. CONTRAST:  69 mL ISOVUE-370 IOPAMIDOL (ISOVUE-370) INJECTION 76% COMPARISON:  CT angiogram chest September 03, 2016 FINDINGS: Cardiovascular: There are incompletely obstructing pulmonary emboli in branches of the right lower lobe pulmonary artery. No more central pulmonary emboli evident. There is no appreciable right heart strain. There is no appreciable thoracic aortic aneurysm or dissection. Visualized great vessels appear unremarkable. There is no pericardial effusion or pericardial thickening evident. Mediastinum/Nodes: Visualized thyroid appears normal. There is no appreciable thoracic adenopathy. No esophageal lesions are appreciable. Lungs/Pleura: There is atelectatic change in the lower lobe regions. There is no  edema or consolidation. No pleural effusion or pleural thickening evident. Upper Abdomen: There is hepatic steatosis. Visualized upper abdominal structures otherwise appear unremarkable. Musculoskeletal: There are foci of degenerative change in the thoracic spine. There are no appreciable blastic or lytic bone lesions. No chest wall lesions are evident. Review of the MIP images confirms the above findings. IMPRESSION: 1. Foci of incompletely obstructing pulmonary embolus in lower lobe pulmonary artery branches on the right. No more central pulmonary embolus evident. No thoracic aortic aneurysm or dissection. No right heart strain. 2.  Mild bibasilar atelectasis.  No lung edema or consolidation. 3.  No appreciable thoracic adenopathy. 4.  Hepatic steatosis. Critical Value/emergent results were called by telephone at the time of interpretation on 06/06/2018 at 5:29 pm to Dr. Isla Pence ,  who verbally acknowledged these results. Electronically Signed   By: Lowella Grip III M.D.   On: 06/06/2018 17:29   Vas [place] Lower Extremity Venous (dvt)  Result Date: 06/07/2018  Lower Venous Study Indications: Pulmonary embolism.  Performing Technologist: Oliver Hum RVT  Examination Guidelines: A complete evaluation includes B-mode imaging, spectral Doppler, color Doppler, and power Doppler as needed of all accessible portions of each vessel. Bilateral testing is considered an integral part of a complete examination. Limited examinations for reoccurring indications may be performed as noted.  Right Venous Findings: +---------+---------------+---------+-----------+----------+-------+          CompressibilityPhasicitySpontaneityPropertiesSummary +---------+---------------+---------+-----------+----------+-------+ CFV      Full           Yes      Yes                          +---------+---------------+---------+-----------+----------+-------+ SFJ      Full                                                  +---------+---------------+---------+-----------+----------+-------+ FV Prox  Full                                                 +---------+---------------+---------+-----------+----------+-------+ FV Mid   Full                                                 +---------+---------------+---------+-----------+----------+-------+ FV DistalFull                                                 +---------+---------------+---------+-----------+----------+-------+ PFV      Full                                                 +---------+---------------+---------+-----------+----------+-------+ POP      Full           Yes      Yes                          +---------+---------------+---------+-----------+----------+-------+ PTV      Full                                                 +---------+---------------+---------+-----------+----------+-------+ PERO     Full                                                 +---------+---------------+---------+-----------+----------+-------+  Left Venous Findings: +---------+---------------+---------+-----------+----------+-------+  CompressibilityPhasicitySpontaneityPropertiesSummary +---------+---------------+---------+-----------+----------+-------+ CFV      Full           Yes      Yes                          +---------+---------------+---------+-----------+----------+-------+ SFJ      Full                                                 +---------+---------------+---------+-----------+----------+-------+ FV Prox  Full                                                 +---------+---------------+---------+-----------+----------+-------+ FV Mid   Full                                                 +---------+---------------+---------+-----------+----------+-------+ FV DistalFull                                                  +---------+---------------+---------+-----------+----------+-------+ PFV      Full                                                 +---------+---------------+---------+-----------+----------+-------+ POP      Full           Yes      Yes                          +---------+---------------+---------+-----------+----------+-------+ PTV      Full                                                 +---------+---------------+---------+-----------+----------+-------+ PERO     Full                                                 +---------+---------------+---------+-----------+----------+-------+    Summary: Right: There is no evidence of deep vein thrombosis in the lower extremity. No cystic structure found in the popliteal fossa. Left: There is no evidence of deep vein thrombosis in the lower extremity. No cystic structure found in the popliteal fossa.  *See table(s) above for measurements and observations. Electronically signed by Sherren Mocha MD on 06/07/2018 at 8:06:17 PM.    Final       Subjective: Overall patient states that she feels much better.  Dyspnea has significantly improved but not completely resolved.  No chest pain, dizziness or lightheadedness reported.  No bleeding reported.  Discharge Exam:  Vitals:   06/08/18 0500 06/08/18 0801  06/08/18 0845 06/08/18 1224  BP: 116/71  130/81 119/88  Pulse: 95  98 (!) 115  Resp: (!) 25  16 (!) 21  Temp: 97.8 F (36.6 C)  98.1 F (36.7 C) 97.8 F (36.6 C)  TempSrc: Axillary  Oral Oral  SpO2: 100% 98% 96% 95%  Weight:      Height:        General: Pt lying comfortably in bed & appears in no obvious distress. Cardiovascular: S1 & S2 heard, RRR, S1/S2 +. No murmurs, rubs, gallops or clicks. No JVD or pedal edema.  Telemetry personally reviewed: SR- mild ST in the 100s at times. Respiratory: Clear to auscultation without wheezing, rhonchi or crackles. No increased work of breathing.  Able to speak freely without difficulty  or dyspnea. Abdominal:  Non distended, non tender & soft. No organomegaly or masses appreciated. Normal bowel sounds heard. CNS: Alert and oriented. No focal deficits. Extremities: no edema, no cyanosis    The results of significant diagnostics from this hospitalization (including imaging, microbiology, ancillary and laboratory) are listed below for reference.       Labs: CBC: Recent Labs  Lab 06/06/18 1605 06/07/18 0316 06/08/18 0753  WBC 9.7 8.1 8.2  NEUTROABS 5.4  --   --   HGB 13.2 11.9* 12.2  HCT 40.1 36.8 36.5  MCV 88.7 89.3 88.8  PLT 338 300 127   Basic Metabolic Panel: Recent Labs  Lab 06/06/18 1605 06/07/18 0316 06/08/18 0753  NA 139 140 139  K 3.5 3.3* 3.8  CL 104 106 102  CO2 _0 GLUCOSE 141* 137* 109*  BUN _1 CREATININE 1.01* 1.04* 1.18*  CALCIUM 8.6* 8.3* 8.5*   Liver Function Tests: Recent Labs  Lab 06/06/18 1605  AST 27  ALT 19  ALKPHOS 69  BILITOT 0.4  PROT 6.8  ALBUMIN 3.6   BNP (last 3 results) Recent Labs    06/06/18 1605  BNP 11.9   Cardiac Enzymes: Recent Labs  Lab 06/06/18 1605  TROPONINI <0.03   Urinalysis    Component Value Date/Time   COLORURINE YELLOW 06/06/2018 1619   APPEARANCEUR CLEAR 06/06/2018 1619   LABSPEC 1.015 06/06/2018 1619   PHURINE 8.5 (H) 06/06/2018 1619   GLUCOSEU NEGATIVE 06/06/2018 1619   HGBUR TRACE (A) 06/06/2018 1619   HGBUR negative 04/04/2007 1513   BILIRUBINUR NEGATIVE 06/06/2018 1619   BILIRUBINUR negative 12/24/2017 1316   KETONESUR NEGATIVE 06/06/2018 1619   PROTEINUR NEGATIVE 06/06/2018 1619   UROBILINOGEN 0.2 12/24/2017 1316   UROBILINOGEN 0.2 04/08/2015 1242   NITRITE NEGATIVE 06/06/2018 1619   LEUKOCYTESUR NEGATIVE 06/06/2018 1619    I discussed in detail with patient's spouse at bedside, updated care and answered all questions.  Time coordinating discharge: 50 minutes  SIGNED:  Vernell Leep, MD, FACP, [facility]. [facility] Pager [phone]  If  7PM-7AM, please contact night-coverage [email or web address] Password [facility] 06/08/2018, 4:39 PM

## 2018-06-08 NOTE — Care Management Note (Signed)
Case Management Note Donn Pierini RN, BSN Transitions of Care Unit 4E- RN Case Manager [phone]  Patient Details  Name: [name] MRN: [id number] Date of Birth: 12-23-1975  Subjective/Objective:  Pt presented with acute PE, (recent back surgery)                  Action/Plan: PTA pt lived at home, notified by [facility] that pt is active with them for HHPT- as pt is currently in OBS status no need for resumption order- however pt pt status changes to INPT then pt would need orders to resume [facility] will follow for transition of care and HH needs on return home.   Expected Discharge Date:                  Expected Discharge Plan:  Home w Home Health Services  In-House Referral:  NA  Discharge planning Services  CM Consult  Post Acute Care Choice:  Home Health, Resumption of Svcs/PTA Provider Choice offered to:  Patient  DME Arranged:    DME Agency:     HH Arranged:  PT HH Agency:  [facility]  Status of Service:  In process, will continue to follow  If discussed at Long Length of Stay Meetings, dates discussed:    Discharge Disposition:   Additional Comments:  Darrold Span, RN 06/08/2018, 1:00 PM

## 2018-06-08 NOTE — Progress Notes (Signed)
Discharge instructions reviewed with patient at this time. All questions answered.   K. Starr Patte Winkel, RN 

## 2018-06-08 NOTE — Progress Notes (Addendum)
ANTICOAGULATION CONSULT NOTE   Pharmacy Consult for Heparin Indication: pulmonary embolus  Allergies  Allergen Reactions  . Gadolinium Derivatives Swelling and Other (See Comments)    Arm swelling, tingling, & redness. Radiologist Dr. Grace Isaac recommends an injection of steroids, or 13-hour prep if non-emergent, if patient needs a gadolinium based contrast in the future.  . Other Rash and Other (See Comments)    Glue or tape to seal up wound  . Zoloft [Sertraline Hcl] Other (See Comments)    Hair loss    Patient Measurements: Height: 5\' 9"  (175.3 cm) Weight: 280 lb 6.8 oz (127.2 kg) IBW/kg (Calculated) : 66.2 Heparin Dosing Weight: 95.8 kg  Vital Signs: Temp: 97.8 F (36.6 C) (01/08 1224) Temp Source: Oral (01/08 1224) BP: 119/88 (01/08 1224) Pulse Rate: 115 (01/08 1224)  Labs: Recent Labs    06/06/18 1605 06/07/18 0316  06/07/18 1326 06/07/18 2345 06/08/18 0753  HGB 13.2 11.9*  --   --   --  12.2  HCT 40.1 36.8  --   --   --  36.5  PLT 338 300  --   --   --  322  HEPARINUNFRC  --   --    < > 0.23* 0.26* 0.36  CREATININE 1.01* 1.04*  --   --   --  1.18*  TROPONINI <0.03  --   --   --   --   --    < > = values in this interval not displayed.    Estimated Creatinine Clearance: 87.9 mL/min (A) (by C-G formula based on SCr of 1.18 mg/dL (H)).   Medical History: Past Medical History:  Diagnosis Date  . Allergy   . Anxiety   . Arthritis   . B12 deficiency   . Depression    not currently   . Dysrhythmia    hx tachy-was from medication nortriptylline  . Esophagitis    rx  . Fibromyalgia   . GERD (gastroesophageal reflux disease)   . History of echocardiogram    Echo 4/18: Mild concentric LVH, vigorous LVF, EF 65-70, normal wall motion, grade 1 diastolic dysfunction  . History of kidney stones   . Hyperlipidemia   . Intervertebral disc protrusion 01/11/2014  . Migraine   . Obesity (BMI 35.0-39.9 without comorbidity)   . Peripheral neuropathy   . Polycystic  ovaries   . Spinal stenosis, lumbar   . Vitamin D deficiency     Assessment: 16 YOF who presented to the [facility] on 1/6 with SOB and CTA showing PE with no R-heart strain. The patient was noted to have recent back surgery (anterior lumbar fusion) on 05/04/18.   Plan to transition to Xarelto for PE treatment dosing. Hgb 12.2, plt 322. No s/sx of bleeding.  Goal of Therapy:  Heparin level 0.3-0.7 units/ml Monitor platelets by anticoagulation protocol: Yes   Plan:  -Discontinue heparin infusion -Start Xarelto 15 mg twice daily for 21 days then 20 mg daily thereafter -Monitor CBC and for s/sx of bleeding  Sherron Monday, PharmD, BCCCP Clinical Pharmacist  Pager: [phone] Phone: [phone] **Pharmacist phone directory can now be found on [email or web address] ([email or web address]).  Listed under [facility].

## 2018-06-10 ENCOUNTER — Ambulatory Visit: Payer: BLUE CROSS/BLUE SHIELD | Admitting: Family Medicine

## 2018-06-10 ENCOUNTER — Encounter: Payer: Self-pay | Admitting: Family Medicine

## 2018-06-10 VITALS — BP 120/86 | HR 133 | Temp 98.9°F | Wt 269.0 lb

## 2018-06-10 DIAGNOSIS — I2699 Other pulmonary embolism without acute cor pulmonale: Secondary | ICD-10-CM | POA: Diagnosis not present

## 2018-06-10 DIAGNOSIS — M48061 Spinal stenosis, lumbar region without neurogenic claudication: Secondary | ICD-10-CM | POA: Diagnosis not present

## 2018-06-10 DIAGNOSIS — F1190 Opioid use, unspecified, uncomplicated: Secondary | ICD-10-CM | POA: Diagnosis not present

## 2018-06-10 MED ORDER — HYDROMORPHONE HCL 2 MG PO TABS: 1.0000 mg | ORAL_TABLET | Freq: Four times a day (QID) | ORAL | 0 refills | Status: DC | PRN

## 2018-06-10 MED ORDER — OXYCODONE HCL 15 MG PO TABS: 15.0000 mg | ORAL_TABLET | ORAL | 0 refills | Status: DC | PRN

## 2018-06-10 NOTE — Progress Notes (Signed)
   Subjective:    Patient ID: [name], female    DOB: [birth year], 43 y.o.   MRN: [id number]  HPI Here for a pain management visit and to follow up a hospital stay from 1--6-20 to 06-08-18 for right sided pulmonary emboli. She is recovering from spinal surgery on 05-04-18, and she presented with chest heaviness and SOB. A CT angiogram revealed the PEs. She was started on Xarelto and the chest symptoms have improved. She still has significant back pain of course.  Indication for chronic opioid: low back pain Medication and dose: Oxycodone 15 mg and Dilaudid 8 mg  # pills per month: 180 and 30  Last UDS date: 03-09-18 Opioid Treatment Agreement signed (Y/N): 03-09-18 Opioid Treatment Agreement last reviewed with patient:  06-10-18 [facility] reviewed this encounter (include red flags):  06-10-18    Review of Systems  Respiratory: Negative.   Cardiovascular: Negative.   Musculoskeletal: Positive for back pain.  Neurological: Negative.        Objective:   Physical Exam Constitutional:      Comments: In pain, walks with a walker   Cardiovascular:     Rate and Rhythm: Normal rate and regular rhythm.     Pulses: Normal pulses.     Heart sounds: Normal heart sounds.  Pulmonary:     Effort: Pulmonary effort is normal. No respiratory distress.     Breath sounds: No stridor. No wheezing, rhonchi or rales.  Neurological:     General: No focal deficit present.     Mental Status: She is alert and oriented to person, place, and time.           Assessment & Plan:  For pain management, she is stable and the meds were refilled. For the PEs, she will transition to Xarelto 20 mg daily as ordered. We wil refer her to Hematology for a hypercoagulability workup. Gershon Crane, MD

## 2018-06-14 DIAGNOSIS — I10 Essential (primary) hypertension: Secondary | ICD-10-CM | POA: Diagnosis not present

## 2018-06-14 DIAGNOSIS — Z9181 History of falling: Secondary | ICD-10-CM | POA: Diagnosis not present

## 2018-06-14 DIAGNOSIS — F411 Generalized anxiety disorder: Secondary | ICD-10-CM | POA: Diagnosis not present

## 2018-06-14 DIAGNOSIS — M797 Fibromyalgia: Secondary | ICD-10-CM | POA: Diagnosis not present

## 2018-06-14 DIAGNOSIS — K5900 Constipation, unspecified: Secondary | ICD-10-CM | POA: Diagnosis not present

## 2018-06-14 DIAGNOSIS — M48061 Spinal stenosis, lumbar region without neurogenic claudication: Secondary | ICD-10-CM | POA: Diagnosis not present

## 2018-06-14 DIAGNOSIS — G43709 Chronic migraine without aura, not intractable, without status migrainosus: Secondary | ICD-10-CM | POA: Diagnosis not present

## 2018-06-14 DIAGNOSIS — M170 Bilateral primary osteoarthritis of knee: Secondary | ICD-10-CM | POA: Diagnosis not present

## 2018-06-14 DIAGNOSIS — G629 Polyneuropathy, unspecified: Secondary | ICD-10-CM | POA: Diagnosis not present

## 2018-06-14 DIAGNOSIS — Z87891 Personal history of nicotine dependence: Secondary | ICD-10-CM | POA: Diagnosis not present

## 2018-06-14 DIAGNOSIS — E538 Deficiency of other specified B group vitamins: Secondary | ICD-10-CM | POA: Diagnosis not present

## 2018-06-14 DIAGNOSIS — F329 Major depressive disorder, single episode, unspecified: Secondary | ICD-10-CM | POA: Diagnosis not present

## 2018-06-16 DIAGNOSIS — M5136 Other intervertebral disc degeneration, lumbar region: Secondary | ICD-10-CM | POA: Diagnosis not present

## 2018-06-17 ENCOUNTER — Emergency Department (HOSPITAL_BASED_OUTPATIENT_CLINIC_OR_DEPARTMENT_OTHER): Payer: BLUE CROSS/BLUE SHIELD

## 2018-06-17 ENCOUNTER — Other Ambulatory Visit: Payer: Self-pay

## 2018-06-17 ENCOUNTER — Telehealth: Payer: Self-pay | Admitting: Family Medicine

## 2018-06-17 ENCOUNTER — Observation Stay (HOSPITAL_BASED_OUTPATIENT_CLINIC_OR_DEPARTMENT_OTHER)
Admission: EM | Admit: 2018-06-17 | Discharge: 2018-06-19 | Disposition: A | Discharge status: Home / Self Care | Payer: BLUE CROSS/BLUE SHIELD | Attending: Internal Medicine | Admitting: Internal Medicine

## 2018-06-17 ENCOUNTER — Encounter (HOSPITAL_BASED_OUTPATIENT_CLINIC_OR_DEPARTMENT_OTHER): Payer: Self-pay | Admitting: Emergency Medicine

## 2018-06-17 DIAGNOSIS — R7989 Other specified abnormal findings of blood chemistry: Secondary | ICD-10-CM

## 2018-06-17 DIAGNOSIS — Z7901 Long term (current) use of anticoagulants: Secondary | ICD-10-CM | POA: Insufficient documentation

## 2018-06-17 DIAGNOSIS — N179 Acute kidney failure, unspecified: Secondary | ICD-10-CM | POA: Insufficient documentation

## 2018-06-17 DIAGNOSIS — R000 Tachycardia, unspecified: Secondary | ICD-10-CM | POA: Diagnosis not present

## 2018-06-17 DIAGNOSIS — Z79891 Long term (current) use of opiate analgesic: Secondary | ICD-10-CM | POA: Insufficient documentation

## 2018-06-17 DIAGNOSIS — D72825 Bandemia: Secondary | ICD-10-CM | POA: Diagnosis not present

## 2018-06-17 DIAGNOSIS — G8929 Other chronic pain: Secondary | ICD-10-CM | POA: Diagnosis not present

## 2018-06-17 DIAGNOSIS — I2699 Other pulmonary embolism without acute cor pulmonale: Secondary | ICD-10-CM | POA: Diagnosis not present

## 2018-06-17 DIAGNOSIS — M48061 Spinal stenosis, lumbar region without neurogenic claudication: Secondary | ICD-10-CM | POA: Insufficient documentation

## 2018-06-17 DIAGNOSIS — M791 Myalgia, unspecified site: Secondary | ICD-10-CM | POA: Diagnosis not present

## 2018-06-17 DIAGNOSIS — R509 Fever, unspecified: Secondary | ICD-10-CM | POA: Diagnosis not present

## 2018-06-17 DIAGNOSIS — R0602 Shortness of breath: Secondary | ICD-10-CM | POA: Diagnosis not present

## 2018-06-17 DIAGNOSIS — R6510 Systemic inflammatory response syndrome (SIRS) of non-infectious origin without acute organ dysfunction: Principal | ICD-10-CM | POA: Diagnosis present

## 2018-06-17 DIAGNOSIS — R072 Precordial pain: Secondary | ICD-10-CM

## 2018-06-17 DIAGNOSIS — F419 Anxiety disorder, unspecified: Secondary | ICD-10-CM | POA: Diagnosis not present

## 2018-06-17 DIAGNOSIS — E785 Hyperlipidemia, unspecified: Secondary | ICD-10-CM | POA: Insufficient documentation

## 2018-06-17 DIAGNOSIS — R079 Chest pain, unspecified: Secondary | ICD-10-CM | POA: Diagnosis not present

## 2018-06-17 DIAGNOSIS — Z79899 Other long term (current) drug therapy: Secondary | ICD-10-CM | POA: Insufficient documentation

## 2018-06-17 DIAGNOSIS — R5081 Fever presenting with conditions classified elsewhere: Secondary | ICD-10-CM | POA: Diagnosis not present

## 2018-06-17 DIAGNOSIS — G629 Polyneuropathy, unspecified: Secondary | ICD-10-CM | POA: Diagnosis not present

## 2018-06-17 DIAGNOSIS — K219 Gastro-esophageal reflux disease without esophagitis: Secondary | ICD-10-CM | POA: Diagnosis not present

## 2018-06-17 DIAGNOSIS — Z87891 Personal history of nicotine dependence: Secondary | ICD-10-CM | POA: Diagnosis not present

## 2018-06-17 DIAGNOSIS — E876 Hypokalemia: Secondary | ICD-10-CM | POA: Diagnosis not present

## 2018-06-17 HISTORY — DX: Opioid use, unspecified, uncomplicated: F11.90

## 2018-06-17 LAB — BASIC METABOLIC PANEL
Anion gap: 11 (ref 5–15)
BUN: 15 mg/dL (ref 6–20)
CO2: 25 mmol/L (ref 22–32)
Calcium: 9.3 mg/dL (ref 8.9–10.3)
Chloride: 98 mmol/L (ref 98–111)
Creatinine, Ser: 1.19 mg/dL — ABNORMAL HIGH (ref 0.44–1.00)
GFR calc Af Amer: >60 mL/min (ref >60)
GFR calc non Af Amer: 56 mL/min — ABNORMAL LOW (ref >60)
Glucose, Bld: 176 mg/dL — ABNORMAL HIGH (ref 70–99)
Potassium: 3.9 mmol/L (ref 3.5–5.1)
Sodium: 134 mmol/L — ABNORMAL LOW (ref 135–145)

## 2018-06-17 LAB — I-STAT CG4 LACTIC ACID, ED
Lactic Acid, Venous: 2.36 mmol/L (ref 0.5–1.9)
Lactic Acid, Venous: 1.59 mmol/L (ref 0.5–1.9)

## 2018-06-17 LAB — URINALYSIS, ROUTINE W REFLEX MICROSCOPIC
Bilirubin Urine: NEGATIVE
Glucose, UA: NEGATIVE mg/dL
Hgb urine dipstick: NEGATIVE
Ketones, ur: NEGATIVE mg/dL
Leukocytes, UA: NEGATIVE
Nitrite: NEGATIVE
Protein, ur: NEGATIVE mg/dL
Specific Gravity, Urine: <1.005 — ABNORMAL LOW (ref 1.005–1.030)
pH: 6.5 (ref 5.0–8.0)

## 2018-06-17 LAB — CBC
HCT: 45.4 % (ref 36.0–46.0)
Hemoglobin: 15.1 g/dL — ABNORMAL HIGH (ref 12.0–15.0)
MCH: 28.7 pg (ref 26.0–34.0)
MCHC: 33.3 g/dL (ref 30.0–36.0)
MCV: 86.1 fL (ref 80.0–100.0)
Platelets: 444 10*3/uL — ABNORMAL HIGH (ref 150–400)
RBC: 5.27 MIL/uL — ABNORMAL HIGH (ref 3.87–5.11)
RDW: 12.3 % (ref 11.5–15.5)
WBC: 25.1 10*3/uL — ABNORMAL HIGH (ref 4.0–10.5)
nRBC: 0 % (ref 0.0–0.2)

## 2018-06-17 LAB — INFLUENZA PANEL BY PCR (TYPE A & B)
Influenza A By PCR: NEGATIVE
Influenza B By PCR: NEGATIVE

## 2018-06-17 LAB — PREGNANCY, URINE: Preg Test, Ur: NEGATIVE

## 2018-06-17 LAB — TROPONIN I: Troponin I: <0.03 ng/mL (ref <0.03)

## 2018-06-17 MED ORDER — SUMATRIPTAN SUCCINATE 50 MG PO TABS
100.0000 mg | ORAL_TABLET | ORAL | Status: DC | PRN
  Filled 2018-06-17: qty 2

## 2018-06-17 MED ORDER — HYDROCHLOROTHIAZIDE 25 MG PO TABS
25.0000 mg | ORAL_TABLET | Freq: Every day | ORAL | Status: DC
  Administered 2018-06-17: 25 mg via ORAL
  Filled 2018-06-17: qty 1

## 2018-06-17 MED ORDER — ALPRAZOLAM 0.5 MG PO TABS
0.5000 mg | ORAL_TABLET | Freq: Three times a day (TID) | ORAL | Status: DC | PRN
  Administered 2018-06-17: 0.5 mg via ORAL
  Filled 2018-06-17 (×2): qty 1

## 2018-06-17 MED ORDER — ACETAMINOPHEN 500 MG PO TABS
1000.0000 mg | ORAL_TABLET | Freq: Once | ORAL | Status: AC
  Administered 2018-06-17: 1000 mg via ORAL
  Filled 2018-06-17: qty 2

## 2018-06-17 MED ORDER — IOPAMIDOL (ISOVUE-370) INJECTION 76%
100.0000 mL | Freq: Once | INTRAVENOUS | Status: AC | PRN
  Administered 2018-06-17: 100 mL via INTRAVENOUS

## 2018-06-17 MED ORDER — VANCOMYCIN HCL 1000 MG IV SOLR
INTRAVENOUS | Status: AC
  Administered 2018-06-17: 2000 mg
  Filled 2018-06-17: qty 2000

## 2018-06-17 MED ORDER — DIAZEPAM 2 MG PO TABS
2.0000 mg | ORAL_TABLET | Freq: Once | ORAL | Status: AC
  Administered 2018-06-17: 2 mg via ORAL
  Filled 2018-06-17: qty 1

## 2018-06-17 MED ORDER — SODIUM CHLORIDE 0.9 % IV SOLN
2.0000 g | Freq: Once | INTRAVENOUS | Status: AC
  Administered 2018-06-17: 2 g via INTRAVENOUS
  Filled 2018-06-17: qty 2

## 2018-06-17 MED ORDER — CEFEPIME HCL 2 G IJ SOLR
INTRAMUSCULAR | Status: AC
  Filled 2018-06-17: qty 2

## 2018-06-17 MED ORDER — SODIUM CHLORIDE 0.9 % IV SOLN
INTRAVENOUS | Status: DC
  Administered 2018-06-17 – 2018-06-19 (×3): via INTRAVENOUS

## 2018-06-17 MED ORDER — HYDROMORPHONE HCL 2 MG PO TABS
1.0000 mg | ORAL_TABLET | Freq: Four times a day (QID) | ORAL | Status: DC | PRN
  Administered 2018-06-17 – 2018-06-19 (×3): 1 mg via ORAL
  Filled 2018-06-17 (×4): qty 1

## 2018-06-17 MED ORDER — SODIUM CHLORIDE 0.9 % IV BOLUS
1000.0000 mL | Freq: Once | INTRAVENOUS | Status: AC
  Administered 2018-06-17: 1000 mL via INTRAVENOUS

## 2018-06-17 MED ORDER — SODIUM CHLORIDE 0.9 % IV SOLN
2.0000 g | Freq: Two times a day (BID) | INTRAVENOUS | Status: DC
  Administered 2018-06-17 – 2018-06-18 (×4): 2 g via INTRAVENOUS
  Filled 2018-06-17 (×6): qty 2

## 2018-06-17 MED ORDER — METRONIDAZOLE IN NACL 5-0.79 MG/ML-% IV SOLN
500.0000 mg | Freq: Three times a day (TID) | INTRAVENOUS | Status: DC
  Administered 2018-06-17 – 2018-06-19 (×7): 500 mg via INTRAVENOUS
  Filled 2018-06-17 (×7): qty 100

## 2018-06-17 MED ORDER — CYCLOBENZAPRINE HCL 10 MG PO TABS
10.0000 mg | ORAL_TABLET | Freq: Three times a day (TID) | ORAL | Status: DC | PRN
  Administered 2018-06-17 – 2018-06-18 (×2): 10 mg via ORAL
  Filled 2018-06-17 (×2): qty 1

## 2018-06-17 MED ORDER — FLUTICASONE PROPIONATE 50 MCG/ACT NA SUSP
2.0000 | Freq: Every day | NASAL | Status: DC
  Filled 2018-06-17: qty 16

## 2018-06-17 MED ORDER — OXYCODONE HCL 5 MG PO TABS
15.0000 mg | ORAL_TABLET | ORAL | Status: DC | PRN
  Administered 2018-06-17 – 2018-06-19 (×11): 15 mg via ORAL
  Filled 2018-06-17 (×11): qty 3

## 2018-06-17 MED ORDER — VANCOMYCIN HCL IN DEXTROSE 1-5 GM/200ML-% IV SOLN: 1000.0000 mg | Freq: Once | INTRAVENOUS | Status: DC

## 2018-06-17 MED ORDER — SODIUM CHLORIDE 0.9% FLUSH
3.0000 mL | Freq: Once | INTRAVENOUS | Status: DC
  Filled 2018-06-17: qty 3

## 2018-06-17 MED ORDER — VANCOMYCIN HCL 10 G IV SOLR
1750.0000 mg | INTRAVENOUS | Status: DC
  Administered 2018-06-17 – 2018-06-18 (×2): 1750 mg via INTRAVENOUS
  Filled 2018-06-17 (×4): qty 1750

## 2018-06-17 MED ORDER — POTASSIUM CHLORIDE ER 10 MEQ PO TBCR: 10.0000 meq | EXTENDED_RELEASE_TABLET | Freq: Every day | ORAL | Status: DC

## 2018-06-17 MED ORDER — GABAPENTIN 300 MG PO CAPS
300.0000 mg | ORAL_CAPSULE | Freq: Three times a day (TID) | ORAL | Status: DC
  Administered 2018-06-17 – 2018-06-19 (×7): 300 mg via ORAL
  Filled 2018-06-17 (×7): qty 1

## 2018-06-17 MED ORDER — VANCOMYCIN HCL 10 G IV SOLR
2000.0000 mg | Freq: Once | INTRAVENOUS | Status: DC
  Filled 2018-06-17: qty 2000

## 2018-06-17 MED ORDER — PROMETHAZINE HCL 25 MG PO TABS
25.0000 mg | ORAL_TABLET | Freq: Four times a day (QID) | ORAL | Status: DC | PRN
  Administered 2018-06-17 – 2018-06-18 (×2): 25 mg via ORAL
  Filled 2018-06-17 (×2): qty 1

## 2018-06-17 MED ORDER — PANTOPRAZOLE SODIUM 40 MG PO TBEC
40.0000 mg | DELAYED_RELEASE_TABLET | Freq: Every day | ORAL | Status: DC
  Administered 2018-06-17 – 2018-06-19 (×3): 40 mg via ORAL
  Filled 2018-06-17 (×3): qty 1

## 2018-06-17 MED ORDER — RIVAROXABAN 15 MG PO TABS
15.0000 mg | ORAL_TABLET | Freq: Two times a day (BID) | ORAL | Status: DC
  Administered 2018-06-17 – 2018-06-19 (×4): 15 mg via ORAL
  Filled 2018-06-17 (×4): qty 1

## 2018-06-17 MED ORDER — HYDROMORPHONE HCL 1 MG/ML IJ SOLN
1.0000 mg | Freq: Once | INTRAMUSCULAR | Status: AC
  Administered 2018-06-17: 1 mg via INTRAVENOUS
  Filled 2018-06-17: qty 1

## 2018-06-17 MED ORDER — LORATADINE 10 MG PO TABS
10.0000 mg | ORAL_TABLET | Freq: Every day | ORAL | Status: DC
  Administered 2018-06-17 – 2018-06-19 (×3): 10 mg via ORAL
  Filled 2018-06-17 (×3): qty 1

## 2018-06-17 MED ORDER — VITAMIN D (ERGOCALCIFEROL) 1.25 MG (50000 UNIT) PO CAPS: 50000.0000 [IU] | ORAL_CAPSULE | ORAL | Status: DC

## 2018-06-17 MED ORDER — ONDANSETRON HCL 4 MG/2ML IJ SOLN
4.0000 mg | Freq: Once | INTRAMUSCULAR | Status: AC
  Administered 2018-06-17: 4 mg via INTRAVENOUS
  Filled 2018-06-17: qty 2

## 2018-06-17 MED ORDER — RIVAROXABAN 15 MG PO TABS
ORAL_TABLET | ORAL | Status: AC
  Administered 2018-06-17: 15 mg
  Filled 2018-06-17: qty 1

## 2018-06-17 MED ORDER — RIVAROXABAN (XARELTO) VTE STARTER PACK (15 & 20 MG)
15.0000 mg | ORAL_TABLET | Freq: Two times a day (BID) | ORAL | Status: DC
  Filled 2018-06-17: qty 1

## 2018-06-17 MED ORDER — CYANOCOBALAMIN 1000 MCG/ML IJ SOLN: 1000.0000 ug | INTRAMUSCULAR | Status: DC

## 2018-06-17 NOTE — Telephone Encounter (Signed)
Called and spoke with [facility] and she was given the VO for the exercises once a week for 4 weeks.

## 2018-06-17 NOTE — Progress Notes (Signed)
Pharmacy Antibiotic Note  [name] is a 43 y.o. female admitted on 06/17/2018 with multiple complaints.  Pharmacy has been consulted for Vancomycin/Cefepime dosing. She recently had lumbar fusion surgery that was complicated by the development of a pulmonary embolism. She presents to the ED tonight with myalgias, tachycardia, CP, shortness of breath. CT Angio does NOT show worsening of the PE. Her WBC is elevated at 25.1 and her lactic acid is elevated as well.    Plan: Vancomycin 2000 mg IV x 1, then give 1750 mg IV q24h >>Estimated AUC: 501 Cefepime 2g IV q12h Flagyl per MD Trend WBC, temp, renal function  F/U infectious work-up Drug levels as indicated   Height: 5\' 9"  (175.3 cm) Weight: 269 lb (122 kg) IBW/kg (Calculated) : 66.2  Temp (24hrs), Avg:100.5 F (38.1 C), Min:99.2 F (37.3 C), Max:101.9 F (38.8 C)  Recent Labs  Lab 06/17/18 0047 06/17/18 0149 06/17/18 0351  WBC 25.1*  --   --   CREATININE 1.19*  --   --   LATICACIDVEN  --  2.36* 1.59    Estimated Creatinine Clearance: 85.2 mL/min (A) (by C-G formula based on SCr of 1.19 mg/dL (H)).    Allergies  Allergen Reactions  . Gadolinium Derivatives Swelling and Other (See Comments)    Arm swelling, tingling, & redness. Radiologist Dr. Grace IsaacWatts recommends an injection of steroids, or 13-hour prep if non-emergent, if patient needs a gadolinium based contrast in the future.  . Other Rash and Other (See Comments)    Glue or tape to seal up wound  . Zoloft [Sertraline Hcl] Other (See Comments)    Hair loss    [name], [name] 06/17/2018 6:09 AM

## 2018-06-17 NOTE — ED Notes (Signed)
Pt demanding  " iv dilaudid", Md informed no new order, flexeril offered , pt declined and states "she will take her own Dilaudid tabs  "  MD made aware

## 2018-06-17 NOTE — ED Notes (Signed)
Patient transported to CT 

## 2018-06-17 NOTE — ED Notes (Signed)
Pt requesting to take own pain medication

## 2018-06-17 NOTE — ED Provider Notes (Signed)
[facility] EMERGENCY DEPARTMENT Provider Note  CSN: [id number] Arrival date & time: 06/17/18 0032  Chief Complaint(s) Shortness of Breath  HPI [name] is a 43 y.o. female recently diagnosed with right lower lobe pulmonary emboli on January 6 currently on Xarelto presents to the emergency department with 3 hours of myalgias, tachycardia (with a max heart rate in the 180s), substernal chest pressure and back pain that is nonexertional and nonradiating.  Patient denies any known fevers but endorses chills.  Endorses rhinorrhea but attributes this to her seasonal allergies.  No coughing or congestion.  Some nausea without emesis.  One episode of diarrhea just prior to arrival.  Denies any abdominal pain.  Denies any urinary symptoms.    Patient was seen by her neurosurgeon for postop follow-up after a lumbar fusion performed early December.  At that time she reported that her heart rate was in the 130s.  Her incisions were well-appearing.  HPI  Past Medical History Past Medical History:  Diagnosis Date  . Allergy   . Anxiety   . Arthritis   . B12 deficiency   . Chronic narcotic use   . Depression    not currently   . Dysrhythmia    hx tachy-was from medication nortriptylline  . Esophagitis    rx  . Fibromyalgia   . GERD (gastroesophageal reflux disease)   . History of echocardiogram    Echo 4/18: Mild concentric LVH, vigorous LVF, EF 65-70, normal wall motion, grade 1 diastolic dysfunction  . History of kidney stones   . Hyperlipidemia   . Intervertebral disc protrusion 01/11/2014  . Migraine   . Obesity (BMI 35.0-39.9 without comorbidity)   . Peripheral neuropathy   . Polycystic ovaries   . Spinal stenosis, lumbar   . Vitamin D deficiency    Patient Active Problem List   Diagnosis Date Noted  . Migraine 06/07/2018  . Pulmonary embolism (HCC) 06/06/2018  . DDD (degenerative disc disease), lumbar 05/04/2018  . Atypical chest pain   . Spinal stenosis of  lumbar region 04/01/2015  . Chronic migraine without aura without status migrainosus, not intractable 03/04/2015  . Elevated plasma metanephrines 11/16/2014  . Alopecia 05/21/2014  . Chronic tachycardia 04/04/2014  . Sciatica 01/11/2014  . Back pain 01/11/2014  . Intervertebral disc protrusion 01/11/2014  . HTN (hypertension) 01/04/2013  . Hemorrhoids 04/25/2012  . Urinary retention 12/11/2010  . CERUMEN IMPACTION 01/28/2010  . Depression 04/08/2009  . FIBROMYALGIA 10/11/2008  . Morbid obesity (HCC) 08/10/2008  . ACNE VULGARIS 08/10/2008  . INSOMNIA 06/20/2008  . VITAMIN B12 DEFICIENCY 03/23/2008  . PERIPHERAL NEUROPATHY 02/27/2008  . CONSTIPATION 08/22/2007  . ANXIETY DISORDER, GENERALIZED 01/04/2007  . Gastroesophageal reflux disease 01/04/2007  . DEGENERATIVE JOINT DISEASE, KNEES, BILATERAL 01/04/2007  . RENAL CALCULUS, HX OF 01/04/2007   Home Medication(s) Prior to Admission medications   Medication Sig Start Date End Date Taking? Authorizing Provider  ALPRAZolam (XANAX) 0.5 MG tablet TAKE 1 TABLET BY MOUTH THREE TIMES A DAY AS NEEDED Patient taking differently: Take 0.5 mg by mouth 3 (three) times daily as needed for anxiety.  11/03/17   Nelwyn SalisburyFry, [name], MD  calcium carbonate (TUMS - DOSED IN MG ELEMENTAL CALCIUM) 500 MG chewable tablet Chew 1 tablet by mouth 2 (two) times daily as needed for indigestion or heartburn.    [provider]  cetirizine (ZYRTEC) 10 MG tablet Take 10 mg by mouth daily.    [provider]  cyanocobalamin (,VITAMIN B-12,) 1000 MCG/ML injection  INJECT 1 ML (1,000 MCG TOTAL) INTO THE MUSCLE EVERY 7 DAYS. Patient taking differently: Inject 1,000 mcg into the muscle once a week. INJECT 1 ML (1,000 MCG TOTAL) INTO THE MUSCLE EVERY 7 DAYS. 05/31/17   Nelwyn SalisburyFry, [name], MD  cyclobenzaprine (FLEXERIL) 10 MG tablet Take 10 mg by mouth 3 (three) times daily as needed for muscle spasms.    [provider]  fluticasone (FLONASE) 50 MCG/ACT  nasal spray Place 2 sprays into both nostrils daily.     [provider]  gabapentin (NEURONTIN) 300 MG capsule Take 1 capsule (300 mg total) by mouth 3 (three) times daily. 04/12/18   Nelwyn SalisburyFry, [name], MD  hydrochlorothiazide (HYDRODIURIL) 25 MG tablet TAKE 1 TABLET BY MOUTH EVERY DAY Patient taking differently: Take 25 mg by mouth daily.  05/06/18   Nelwyn SalisburyFry, [name], MD  HYDROmorphone (DILAUDID) 2 MG tablet Take 0.5 tablets (1 mg total) by mouth every 6 (six) hours as needed for severe pain. 06/10/18   Nelwyn SalisburyFry, [name], MD  Multiple Vitamins-Minerals (MULTI-VITAMIN GUMMIES PO) Take 2 tablets by mouth daily.     [provider]  nitrofurantoin, macrocrystal-monohydrate, (MACROBID) 100 MG capsule Take 100 mg by mouth daily as needed (for uti prophylaxis, after intercourse).  09/26/16   [provider]  oxyCODONE (ROXICODONE) 15 MG immediate release tablet Take 1 tablet (15 mg total) by mouth every 4 (four) hours as needed for up to 30 days for pain. 08/09/18 09/08/18  Nelwyn SalisburyFry, [name], MD  pantoprazole (PROTONIX) 40 MG tablet Take 1 tablet (40 mg total) by mouth daily. 06/08/18   Hongalgi, Maximino GreenlandAnand D, MD  potassium chloride (KLOR-CON 10) 10 MEQ tablet Take 1 tablet (10 mEq total) by mouth daily. Patient taking differently: Take 10 mEq by mouth daily as needed (for potassium levels).  03/26/16   Nelwyn SalisburyFry, [name], MD  promethazine (PHENERGAN) 25 MG tablet TAKE 1 TABLET BY MOUTH EVERY 6 HOURS AS NEEDED FOR NAUSEA Patient taking differently: Take 25 mg by mouth every 6 (six) hours as needed for nausea.  02/19/17   Nelwyn SalisburyFry, [name], MD  Rivaroxaban 15 & 20 MG TBPK Take as directed on package: Start with one 15mg  tablet by mouth twice a day with food. On Day 22, switch to one 20mg  tablet once a day with food. Pharmacy: Please discard first tablet from the pack which she has received in the hospital. 06/08/18   Elease EtienneHongalgi, Anand D, MD  simethicone (MYLICON) 80 MG chewable tablet Chew 160 mg by mouth every 6  (six) hours as needed (gas pain).    [provider]  SUMAtriptan (IMITREX) 100 MG tablet Take 1 tablet earliest onset of migraine.  May repeat once in 2 hours if headache persists or recurs. 10/14/16   Jaffe, Adam R, DO  SYRINGE-NEEDLE, DISP, 3 ML (B-D 3CC LUER-LOK SYR 25GX1") 25G X 1" 3 ML MISC USE AS DIRECTED 08/05/17   Nelwyn SalisburyFry, [name], MD  SYRINGE-NEEDLE, DISP, 3 ML (B-D 3CC LUER-LOK SYR 25GX1") 25G X 1" 3 ML MISC USE AS DIRECTED 04/15/18   Nelwyn SalisburyFry, [name], MD  Vitamin D, Ergocalciferol, (DRISDOL) 50000 units CAPS capsule TAKE 1 CAPSULE (50,000 UNITS TOTAL) BY MOUTH EVERY 7 (SEVEN) DAYS. 05/31/17   Nelwyn SalisburyFry, [name], MD  Past Surgical History Past Surgical History:  Procedure Laterality Date  . 24 HOUR PH STUDY N/A 05/27/2016   Procedure: 24 HOUR PH STUDY;  Surgeon: Ruffin Frederick, MD;  Location: [facility] END[facility]COPY;  Service: Gastroenterology;  Laterality: N/A;  . ABDOMINAL EXPOSURE N/A 05/04/2018   Procedure: ABDOMINAL EXPOSURE;  Surgeon: Larina Earthly, MD;  [facility];  Service: Vascular;  Laterality: N/A;  . ANTERIOR LUMBAR FUSION N/A 05/04/2018   Procedure: Lumbar Five Sacral One Anterior lumbar interbody fusion;  Surgeon: Donalee Citrin, MD;  Location: [facility] OR;  Service: Neurosurgery;  Laterality: N/A;  Lumbar Five Sacral One Anterior lumbar interbody fusion  . APPENDECTOMY    . BACK SURGERY  2012   left laminectomy at L3-4 per Dr. Phoebe Perch   . bladder tack  2012  . CHOLECYSTECTOMY N/A 10/03/2015   Procedure: LAPAROSCOPIC CHOLECYSTECTOMY;  Surgeon: Rodman Pickle, MD;  Location: [facility] OR;  Service: General;  Laterality: N/A;  . ESOPHAGEAL MANOMETRY N/A 05/27/2016   Procedure: ESOPHAGEAL MANOMETRY (EM);  Surgeon: Ruffin Frederick, MD;  Location: [facility]OSCOPY;  Service: Gastroenterology;  Laterality: N/A;  . EXCISION OF SKIN TAG  10/03/2015   Procedure:  EXCISION OF SKIN TAG;  Surgeon: De Blanch Kinsinger, MD;  Location: [facility] OR;  Service: General;;  . LUMBAR DISC SURGERY  01-31-14   per Dr. [name]   . OOPHORECTOMY     left overy  . OVARIAN CYST REMOVAL    . RECTOCELE REPAIR    . SINUSOTOMY  12/14/06  . spinal fusion  2016/10  . TONSILLECTOMY    . TONSILLECTOMY    . VAGINAL HYSTERECTOMY    . WRIST GANGLION EXCISION Left    Family History Family History  Problem Relation Age of Onset  . Fibromyalgia Mother   . Diabetes Mother   . Thyroid cancer Mother   . Liver disease Mother   . Neuropathy Mother   . Cirrhosis Mother        non-alcoholic  . Pulmonary embolism Mother        both lungs  . Hypertension Father   . Diabetes Father   . Heart disease Father   . Prostate cancer Father   . Heart attack Father        x 2  . Colon cancer Neg Hx   . Other Neg Hx        pheochromocytoma  . Colon polyps Neg Hx     Social History Social History   Tobacco Use  . Smoking status: Former Smoker    Types: Cigarettes    Last attempt to quit: 1999    Years since quitting: 21.0  . Smokeless tobacco: Never Used  Substance Use Topics  . Alcohol use: No    Alcohol/week: 0.0 standard drinks  . Drug use: No   Allergies Gadolinium derivatives; Other; and Zoloft [sertraline hcl]  Review of Systems Review of Systems All other systems are reviewed and are negative for acute change except as noted in the HPI  Physical Exam Vital Signs  I have reviewed the triage vital signs BP 120/90 (BP Location: Right Arm)   Pulse (!) 163   Temp (!) 101.9 F (38.8 C) (Rectal)   Resp (!) 22   Ht 5\' 9"  (1.753 m)   Wt 122 kg   LMP 12/13/1999 Comment: single oophorectomy//a.c.  SpO2 97%   BMI 39.72 kg/m   Physical Exam Vitals signs reviewed.  Constitutional:      General: She is not in acute  distress.    Appearance: She is well-developed. She is not diaphoretic.  HENT:     Head: Normocephalic and atraumatic.     Nose: Nose normal.  Eyes:      General: No scleral icterus.       Right eye: No discharge.        Left eye: No discharge.     Conjunctiva/sclera: Conjunctivae normal.     Pupils: Pupils are equal, round, and reactive to light.  Neck:     Musculoskeletal: Normal range of motion and neck supple.  Cardiovascular:     Rate and Rhythm: Regular rhythm. Tachycardia present.     Heart sounds: No murmur. No friction rub. No gallop.   Pulmonary:     Effort: Pulmonary effort is normal. No respiratory distress.     Breath sounds: Normal breath sounds. No stridor. No rales.  Abdominal:     General: There is no distension.     Palpations: Abdomen is soft.     Tenderness: There is no abdominal tenderness.    Musculoskeletal:        General: No tenderness.       Back:  Skin:    General: Skin is warm and dry.     Findings: No erythema or rash.  Neurological:     Mental Status: She is alert and oriented to person, place, and time.     ED Results and Treatments Labs (all labs ordered are listed, but only abnormal results are displayed) Labs Reviewed  BASIC METABOLIC PANEL - Abnormal; Notable for the following components:      Result Value   Sodium 134 (*)    Glucose, Bld 176 (*)    Creatinine, Ser 1.19 (*)    GFR calc non Af Amer 56 (*)    All other components within normal limits  CBC - Abnormal; Notable for the following components:   WBC 25.1 (*)    RBC 5.27 (*)    Hemoglobin 15.1 (*)    Platelets 444 (*)    All other components within normal limits  URINALYSIS, ROUTINE W REFLEX MICROSCOPIC - Abnormal; Notable for the following components:   Specific Gravity, Urine <1.005 (*)    All other components within normal limits  I-STAT CG4 LACTIC ACID, ED - Abnormal; Notable for the following components:   Lactic Acid, Venous 2.36 (*)    All other components within normal limits  CULTURE, BLOOD (ROUTINE X 2)  CULTURE, BLOOD (ROUTINE X 2)  TROPONIN I  PREGNANCY, URINE  INFLUENZA PANEL BY PCR (TYPE A & B)  I-STAT  CG4 LACTIC ACID, ED                                                                                                                         EKG  EKG Interpretation  Date/Time:  Friday June 17 2018 00:41:37 EST Ventricular Rate:  159 PR Interval:    QRS Duration: 75 QT Interval:  257 QTC Calculation: 418  R Axis:   53 Text Interpretation:  Sinus tachycardia Multiform ventricular premature complexes Confirmed by Drema Pry [phone]) on 06/17/2018 2:06:13 AM      Radiology Dg Chest 2 View  Result Date: 06/17/2018 CLINICAL DATA:  43 year old female with chest pain. EXAM: CHEST - 2 VIEW COMPARISON:  Chest CT dated 06/06/2018 FINDINGS: The heart size and mediastinal contours are within normal limits. Both lungs are clear. The visualized skeletal structures are unremarkable. IMPRESSION: No active cardiopulmonary disease. Electronically Signed   By: Elgie Collard M.D.   On: 06/17/2018 01:35   Ct Angio Chest Pe W And/or Wo Contrast  Result Date: 06/17/2018 CLINICAL DATA:  Chest pain, shortness of breath, history of right lower lobe pulmonary emboli EXAM: CT ANGIOGRAPHY CHEST WITH CONTRAST TECHNIQUE: Multidetector CT imaging of the chest was performed using the standard protocol during bolus administration of intravenous contrast. Multiplanar CT image reconstructions and MIPs were obtained to evaluate the vascular anatomy. CONTRAST:  ISOVUE-370 IOPAMIDOL (ISOVUE-370) INJECTION 76% COMPARISON:  CTA chest dated 06/06/2018 FINDINGS: Cardiovascular: Satisfactory opacification of the bilateral pulmonary arteries to the segmental level. Tiny segmental filling defect within the right lower lobe pulmonary artery (series 12/image 155). Additional tiny subsegmental filling defect with a right lower lobe pulmonary artery (series 12/image 133). This appearance is similar versus mildly improved from the prior. Overall clot burden is minimal.  No evidence of right heart strain. Heart is normal in size.   No pericardial effusion. No evidence of thoracic aortic aneurysm or dissection. Mediastinum/Nodes: No suspicious mediastinal lymphadenopathy. Visualized thyroid is unremarkable. Lungs/Pleura: Platelike scarring/atelectasis in the right lower lobe. No focal consolidation. No suspicious pulmonary nodules. No pleural effusion or pneumothorax. Upper Abdomen: Visualized upper abdomen is notable for moderate to severe hepatic steatosis and prior cholecystectomy. Musculoskeletal: Mild degenerative changes of the mid thoracic spine. Review of the MIP images confirms the above findings. IMPRESSION: Tiny segmental/subsegmental pulmonary emboli within the right lower lobe pulmonary artery, stable versus mildly improved from recent CTA chest. Electronically Signed   By: Charline Bills M.D.   On: 06/17/2018 03:29   Pertinent labs & imaging results that were available during my care of the patient were reviewed by me and considered in my medical decision making (see chart for details).  Medications Ordered in ED Medications  sodium chloride flush (NS) 0.9 % injection 3 mL (3 mLs Intravenous Not Given 06/17/18 0057)  metroNIDAZOLE (FLAGYL) IVPB 500 mg (500 mg Intravenous New Bag/Given 06/17/18 0337)  vancomycin (VANCOCIN) 2,000 mg in sodium chloride 0.9 % 500 mL IVPB ( Intravenous Canceled Entry 06/17/18 0302)  oxyCODONE (Oxy IR/ROXICODONE) immediate release tablet 15 mg (15 mg Oral Given 06/17/18 0422)  ceFEPIme (MAXIPIME) 2 g injection (  Canceled Entry 06/17/18 0345)  sodium chloride 0.9 % bolus 1,000 mL ( Intravenous Stopped 06/17/18 0300)  acetaminophen (TYLENOL) tablet 1,000 mg (1,000 mg Oral Given 06/17/18 0126)  HYDROmorphone (DILAUDID) injection 1 mg (1 mg Intravenous Given 06/17/18 0126)  ondansetron (ZOFRAN) injection 4 mg (4 mg Intravenous Given 06/17/18 0126)  ceFEPIme (MAXIPIME) 2 g in sodium chloride 0.9 % 100 mL IVPB ( Intravenous Stopped 06/17/18 0332)  iopamidol (ISOVUE-370) 76 % injection 100 mL (100  mLs Intravenous Contrast Given 06/17/18 0231)  HYDROmorphone (DILAUDID) injection 1 mg (1 mg Intravenous Given 06/17/18 0305)  diazepam (VALIUM) tablet 2 mg (2 mg Oral Given 06/17/18 0255)  vancomycin (VANCOCIN) 1000 MG powder (2,000 mg  Given 06/17/18 0253)  Procedures Procedures CRITICAL CARE Performed by: Amadeo Garnet Miroslava Santellan Total critical care time: 40 minutes Critical care time was exclusive of separately billable procedures and treating other patients. Critical care was necessary to treat or prevent imminent or life-threatening deterioration. Critical care was time spent personally by me on the following activities: development of treatment plan with patient and/or surrogate as well as nursing, discussions with consultants, evaluation of patient's response to treatment, examination of patient, obtaining history from patient or surrogate, ordering and performing treatments and interventions, ordering and review of laboratory studies, ordering and review of radiographic studies, pulse oximetry and re-evaluation of patient's condition.   (including critical care time)  Medical Decision Making / ED Course I have reviewed the nursing notes for this encounter and the patient's prior records (if available in EHR or on provided paperwork).    Patient is complaining of central chest pain with shortness of breath and myalgias.  EKG with sinus tachycardia.  She is febrile with significant tachycardia with rates in the 160s.  Lungs clear to auscultation bilaterally.  No evidence of acute otitis media pharyngitis.  Abdomen benign.  Considering viral process however given recent surgery and hospitalization for pulmonary embolism will need to rule out bacterial etiology.  CBC with significant leukocytosis.  Lactic acid elevated greater than 2.  Code sepsis initiated and  patient started on empiric antibiotics for unknown source.  She did not require 30 cc/kg of IV fluids but was provided with 2 L of IV fluids.  Chest x-ray without infiltrates.  Given the chest pain or shortness of breath, CTA obtained and did not reveal any new pulmonary emboli.  There was also no evidence of pneumonia, pleural effusions.  UA without evidence of infection.  Influenza swab sent but will not return.  Given her clinical picture, will discuss case with the hospitalist for admission and continued work-up and management.    Final Clinical Impression(s) / ED Diagnoses Final diagnoses:  Fever in other diseases  Myalgia  Precordial pain  Tachycardia  Elevated lactic acid level  Bandemia      This chart was dictated using voice recognition software.  Despite best efforts to proofread,  errors can occur which can change the documentation meaning.   Nira Conn, MD 06/17/18 [phone]

## 2018-06-17 NOTE — ED Notes (Signed)
Pt very anxious and talkative. MD in performing Ultrasound on chest.

## 2018-06-17 NOTE — H&P (Signed)
History and Physical    [name] ASN:[id number] DOB: 01-18-1976 DOA: 06/17/2018  PCP: [name], MD Patient coming from: Home transferred from med [facility] Chief Complaint: Fever of 102 shaking chills and chest pain  HPI: [name] is a 43 y.o. female with medical history significant of recent spinal fusion May 04, 2018 followed by PE June 06, 2018 admitted with complaints of fever of 102 at home associated with shaking chills and chest pain.  She is unable to explain the chest pain in detail.  However she does say she had retrosternal chest pain radiating to the back with no aggravating or releasing and relieving factors.  She does have some pain now with but it is better than before.  She denies any cough she had myalgias.  She has no recent sick contacts.  She is started on Xarelto for the pulmonary embolism which is the only new medication that she has been started on.  Denies any diarrhea.  Denies any vomiting.  However she feels nauseous.  She has a history of cystitis postcoital but at this time she has no urinary complaints no dysuria or frequency.  Denies abdominal pain.  Does have history of chronic headache and migraines.  Today her heart rate was found to be in the 180s.  She does have a incision on the front and back which has been healed well.    ED Course: Vancomycin cefepime and Flagyl.  Work-up in the ER included CT of the chest showed tiny segmental subsegmental pulmonary embolism within the right lower lobe pulmonary artery stable versus mildly improved from recent CT angiogram of the chest.  Chest x-ray showed no active pulmonary disease.  Sodium 134 potassium 3.9 BUN 15 creatinine 1.19 her creatinine on 06/08/2018 was 1.18.  White count was 25,000, white hemoglobin 15.1 platelet count 444.  Blood pressure was 06/24/1989 pulse was 116 temperature was 98.2 T-max was 101.9. She denies any changes with her vision or headache at this time denies any  localized weakness. Review of Systems: As per HPI otherwise all other systems reviewed and are negative  Ambulatory Status: She walks with a walker at home lives at home with her family husband and daughter.  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  Past Medical History:  Diagnosis Date  . Allergy   . Anxiety   . Arthritis   . B12 deficiency   . Chronic narcotic use   . Depression    not currently   . Dysrhythmia    hx tachy-was from medication nortriptylline  . Esophagitis    rx  . Fibromyalgia   . GERD (gastroesophageal reflux disease)   . History of echocardiogram    Echo 4/18: Mild concentric LVH, vigorous LVF, EF 65-70, normal wall motion, grade 1 diastolic dysfunction  . History of kidney stones   . Hyperlipidemia   . Intervertebral disc protrusion 01/11/2014  . Migraine   . Obesity (BMI 35.0-39.9 without comorbidity)   . Peripheral neuropathy   . Polycystic ovaries   . Spinal stenosis, lumbar   . Vitamin D deficiency     Past Surgical History:  Procedure Laterality Date  . 24 HOUR PH STUDY N/A 05/27/2016   Procedure: 24 HOUR PH STUDY;  Surgeon: Ruffin Frederick, MD;  Location: [facility];  Service: Gastroenterology;  Laterality: N/A;  . ABDOMINAL EXPOSURE N/A 05/04/2018   Procedure: ABDOMINAL EXPOSURE;  Surgeon: Larina Earthly, MD;  Location: [facility] OR;  Service:  Vascular;  Laterality: N/A;  . ANTERIOR LUMBAR FUSION N/A 05/04/2018   Procedure: Lumbar Five Sacral One Anterior lumbar interbody fusion;  Surgeon: Donalee Citrinram, Gary, MD;  [facility];  Service: Neurosurgery;  Laterality: N/A;  Lumbar Five Sacral One Anterior lumbar interbody fusion  . APPENDECTOMY    . BACK SURGERY  2012   left laminectomy at L3-4 per Dr. Phoebe PerchHirsch   . bladder tack  2012  . CHOLECYSTECTOMY N/A 10/03/2015   Procedure: LAPAROSCOPIC CHOLECYSTECTOMY;  Surgeon: Rodman PickleLuke Aaron Kinsinger, MD;  [facility];  Service: General;  Laterality: N/A;  . ESOPHAGEAL MANOMETRY N/A 05/27/2016    Procedure: ESOPHAGEAL MANOMETRY (EM);  Surgeon: Ruffin FrederickSteven Paul Armbruster, MD;  Location: [facility];  Service: Gastroenterology;  Laterality: N/A;  . EXCISION OF SKIN TAG  10/03/2015   Procedure: EXCISION OF SKIN TAG;  Surgeon: De BlanchLuke Aaron Kinsinger, MD;  Location: [facility] OR;  Service: General;;  . LUMBAR DISC SURGERY  01-31-14   per Dr. Wynetta Emeryram   . OOPHORECTOMY     left overy  . OVARIAN CYST REMOVAL    . RECTOCELE REPAIR    . SINUSOTOMY  12/14/06  . spinal fusion  2016/10  . TONSILLECTOMY    . TONSILLECTOMY    . VAGINAL HYSTERECTOMY    . WRIST GANGLION EXCISION Left     Social History   Socioeconomic History  . Marital status: Married    Spouse name: Not on file  . Number of children: 2  . Years of education: Not on file  . Highest education level: Not on file  Occupational History  . Occupation: housewife  Social Needs  . Financial resource strain: Not on file  . Food insecurity:    Worry: Not on file    Inability: Not on file  . Transportation needs:    Medical: Not on file    Non-medical: Not on file  Tobacco Use  . Smoking status: Former Smoker    Types: Cigarettes    Last attempt to quit: 1999    Years since quitting: 21.0  . Smokeless tobacco: Never Used  Substance and Sexual Activity  . Alcohol use: No    Alcohol/week: 0.0 standard drinks  . Drug use: No  . Sexual activity: Yes    Birth control/protection: Surgical  Lifestyle  . Physical activity:    Days per week: Not on file    Minutes per session: Not on file  . Stress: Not on file  Relationships  . Social connections:    Talks on phone: Not on file    Gets together: Not on file    Attends religious service: Not on file    Active member of club or organization: Not on file    Attends meetings of clubs or organizations: Not on file    Relationship status: Not on file  . Intimate partner violence:    Fear of current or ex partner: Not on file    Emotionally abused: Not on file    Physically abused: Not on  file    Forced sexual activity: Not on file  Other Topics Concern  . Not on file  Social History Narrative  . Not on file    Allergies  Allergen Reactions  . Gadolinium Derivatives Swelling and Other (See Comments)    Arm swelling, tingling, & redness. Radiologist Dr. Grace IsaacWatts recommends an injection of steroids, or 13-hour prep if non-emergent, if patient needs a gadolinium based contrast in the future.  . Other Rash and Other (See  Comments)    Glue or tape to seal up wound  . Zoloft [Sertraline Hcl] Other (See Comments)    Hair loss    Family History  Problem Relation Age of Onset  . Fibromyalgia Mother   . Diabetes Mother   . Thyroid cancer Mother   . Liver disease Mother   . Neuropathy Mother   . Cirrhosis Mother        non-alcoholic  . Pulmonary embolism Mother        both lungs  . Hypertension Father   . Diabetes Father   . Heart disease Father   . Prostate cancer Father   . Heart attack Father        x 2  . Colon cancer Neg Hx   . Other Neg Hx        pheochromocytoma  . Colon polyps Neg Hx       Prior to Admission medications   Medication Sig Start Date End Date Taking? Authorizing Provider  ALPRAZolam (XANAX) 0.5 MG tablet TAKE 1 TABLET BY MOUTH THREE TIMES A DAY AS NEEDED Patient taking differently: Take 0.5 mg by mouth 3 (three) times daily as needed for anxiety.  11/03/17   [name], MD  calcium carbonate (TUMS - DOSED IN MG ELEMENTAL CALCIUM) 500 MG chewable tablet Chew 1 tablet by mouth 2 (two) times daily as needed for indigestion or heartburn.    [provider]  cetirizine (ZYRTEC) 10 MG tablet Take 10 mg by mouth daily.    [provider]  cyanocobalamin (,VITAMIN B-12,) 1000 MCG/ML injection INJECT 1 ML (1,000 MCG TOTAL) INTO THE MUSCLE EVERY 7 DAYS. Patient taking differently: Inject 1,000 mcg into the muscle once a week. INJECT 1 ML (1,000 MCG TOTAL) INTO THE MUSCLE EVERY 7 DAYS. 05/31/17   [name], MD  cyclobenzaprine  (FLEXERIL) 10 MG tablet Take 10 mg by mouth 3 (three) times daily as needed for muscle spasms.    [provider]  fluticasone (FLONASE) 50 MCG/ACT nasal spray Place 2 sprays into both nostrils daily.     [provider]  gabapentin (NEURONTIN) 300 MG capsule Take 1 capsule (300 mg total) by mouth 3 (three) times daily. 04/12/18   [name], MD  hydrochlorothiazide (HYDRODIURIL) 25 MG tablet TAKE 1 TABLET BY MOUTH EVERY DAY Patient taking differently: Take 25 mg by mouth daily.  05/06/18   [name], MD  HYDROmorphone (DILAUDID) 2 MG tablet Take 0.5 tablets (1 mg total) by mouth every 6 (six) hours as needed for severe pain. 06/10/18   [name], MD  Multiple Vitamins-Minerals (MULTI-VITAMIN GUMMIES PO) Take 2 tablets by mouth daily.     [provider]  nitrofurantoin, macrocrystal-monohydrate, (MACROBID) 100 MG capsule Take 100 mg by mouth daily as needed (for uti prophylaxis, after intercourse).  09/26/16   [provider]  oxyCODONE (ROXICODONE) 15 MG immediate release tablet Take 1 tablet (15 mg total) by mouth every 4 (four) hours as needed for up to 30 days for pain. 08/09/18 09/08/18  [name], MD  pantoprazole (PROTONIX) 40 MG tablet Take 1 tablet (40 mg total) by mouth daily. 06/08/18   Hongalgi, Maximino Greenland, MD  potassium chloride (KLOR-CON 10) 10 MEQ tablet Take 1 tablet (10 mEq total) by mouth daily. Patient taking differently: Take 10 mEq by mouth daily as needed (for potassium levels).  03/26/16   [name], MD  promethazine (PHENERGAN) 25 MG tablet TAKE 1 TABLET  BY MOUTH EVERY 6 HOURS AS NEEDED FOR NAUSEA Patient taking differently: Take 25 mg by mouth every 6 (six) hours as needed for nausea.  02/19/17   [name], MD  Rivaroxaban 15 & 20 MG TBPK Take as directed on package: Start with one 15mg  tablet by mouth twice a day with food. On Day 22, switch to one 20mg  tablet once a day with food. Pharmacy: Please discard first tablet from  the pack which she has received in the hospital. 06/08/18   Elease Etienne, MD  simethicone (MYLICON) 80 MG chewable tablet Chew 160 mg by mouth every 6 (six) hours as needed (gas pain).    [provider]  SUMAtriptan (IMITREX) 100 MG tablet Take 1 tablet earliest onset of migraine.  May repeat once in 2 hours if headache persists or recurs. 10/14/16   Jaffe, Adam R, DO  SYRINGE-NEEDLE, DISP, 3 ML (B-D 3CC LUER-LOK SYR 25GX1") 25G X 1" 3 ML MISC USE AS DIRECTED 08/05/17   [name], MD  SYRINGE-NEEDLE, DISP, 3 ML (B-D 3CC LUER-LOK SYR 25GX1") 25G X 1" 3 ML MISC USE AS DIRECTED 04/15/18   [name], MD  Vitamin D, Ergocalciferol, (DRISDOL) 50000 units CAPS capsule TAKE 1 CAPSULE (50,000 UNITS TOTAL) BY MOUTH EVERY 7 (SEVEN) DAYS. 05/31/17   [name], MD    Physical Exam: Vitals:   06/17/18 1230 06/17/18 1335 06/17/18 1338 06/17/18 1555  BP:  118/74  (!) 124/91  Pulse: (!) 102 (!) 116  (!) 116  Resp:  20    Temp:   98.5 F (36.9 C) 98.2 F (36.8 C)  TempSrc:   Oral Oral  SpO2: 100% 96%  98%  Weight:      Height:         . General:  Appears calm and comfortable . Eyes: PERRL, EOMI, normal lids, iris . ENT: grossly normal hearing, lips & tongue, oral mucosa dry . Neck: no LAD, masses or thyromegaly . Cardiovascular:  RRR, no m/r/g. No LE edema.  Marland Kitchen Respiratory: CTA bilaterally, no w/r/r. Normal respiratory effort. . Abdomen:  soft, ntnd, NABS . Skin:  no rash or induration seen on limited exam . Musculoskeletal: grossly normal tone BUE/BLE, good ROM, no bony abnormality . Psychiatric:  grossly normal mood and affect, speech fluent and appropriate, AOx3 . Neurologic:  CN 2-12 grossly intact, moves all extremities in coordinated fashion, sensation intact  Labs on Admission: I have personally reviewed following labs and imaging studies  CBC: Recent Labs  Lab 06/17/18 0047  WBC 25.1*  HGB 15.1*  HCT 45.4  MCV 86.1  PLT 444*   Basic Metabolic  Panel: Recent Labs  Lab 06/17/18 0047  NA 134*  K 3.9  CL 98  CO2 25  GLUCOSE 176*  BUN 15  CREATININE 1.19*  CALCIUM 9.3   GFR: Estimated Creatinine Clearance: 85.2 mL/min (A) (by C-G formula based on SCr of 1.19 mg/dL (H)). Liver Function Tests: No results for input(s): AST, ALT, ALKPHOS, BILITOT, PROT, ALBUMIN in the last 168 hours. No results for input(s): LIPASE, AMYLASE in the last 168 hours. No results for input(s): AMMONIA in the last 168 hours. Coagulation Profile: No results for input(s): INR, PROTIME in the last 168 hours. Cardiac Enzymes: Recent Labs  Lab 06/17/18 0047  TROPONINI <0.03   BNP (last 3 results) No results for input(s): PROBNP in the last 8760 hours. HbA1C: No results for input(s): HGBA1C in the last 72 hours. CBG: No results  for input(s): GLUCAP in the last 168 hours. Lipid Profile: No results for input(s): CHOL, HDL, LDLCALC, TRIG, CHOLHDL, LDLDIRECT in the last 72 hours. Thyroid Function Tests: No results for input(s): TSH, T4TOTAL, FREET4, T3FREE, THYROIDAB in the last 72 hours. Anemia Panel: No results for input(s): VITAMINB12, FOLATE, FERRITIN, TIBC, IRON, RETICCTPCT in the last 72 hours. Urine analysis:    Component Value Date/Time   COLORURINE YELLOW 06/17/2018 0406   APPEARANCEUR CLEAR 06/17/2018 0406   LABSPEC <1.005 (L) 06/17/2018 0406   PHURINE 6.5 06/17/2018 0406   GLUCOSEU NEGATIVE 06/17/2018 0406   HGBUR NEGATIVE 06/17/2018 0406   HGBUR negative 04/04/2007 1513   BILIRUBINUR NEGATIVE 06/17/2018 0406   BILIRUBINUR negative 12/24/2017 1316   KETONESUR NEGATIVE 06/17/2018 0406   PROTEINUR NEGATIVE 06/17/2018 0406   UROBILINOGEN 0.2 12/24/2017 1316   UROBILINOGEN 0.2 04/08/2015 1242   NITRITE NEGATIVE 06/17/2018 0406   LEUKOCYTESUR NEGATIVE 06/17/2018 0406    Creatinine Clearance: Estimated Creatinine Clearance: 85.2 mL/min (A) (by C-G formula based on SCr of 1.19 mg/dL (H)).  Sepsis  Labs: @LABRCNTIP (procalcitonin:4,lacticidven:4) )No results found for this or any previous visit (from the past 240 hour(s)).   Radiological Exams on Admission: Dg Chest 2 View  Result Date: 06/17/2018 CLINICAL DATA:  43 year old female with chest pain. EXAM: CHEST - 2 VIEW COMPARISON:  Chest CT dated 06/06/2018 FINDINGS: The heart size and mediastinal contours are within normal limits. Both lungs are clear. The visualized skeletal structures are unremarkable. IMPRESSION: No active cardiopulmonary disease. Electronically Signed   By: Elgie CollardArash  Radparvar M.D.   On: 06/17/2018 01:35   Ct Angio Chest Pe W And/or Wo Contrast  Result Date: 06/17/2018 CLINICAL DATA:  Chest pain, shortness of breath, history of right lower lobe pulmonary emboli EXAM: CT ANGIOGRAPHY CHEST WITH CONTRAST TECHNIQUE: Multidetector CT imaging of the chest was performed using the standard protocol during bolus administration of intravenous contrast. Multiplanar CT image reconstructions and MIPs were obtained to evaluate the vascular anatomy. CONTRAST:  100mL ISOVUE-370 IOPAMIDOL (ISOVUE-370) INJECTION 76% COMPARISON:  CTA chest dated 06/06/2018 FINDINGS: Cardiovascular: Satisfactory opacification of the bilateral pulmonary arteries to the segmental level. Tiny segmental filling defect within the right lower lobe pulmonary artery (series 12/image 155). Additional tiny subsegmental filling defect with a right lower lobe pulmonary artery (series 12/image 133). This appearance is similar versus mildly improved from the prior. Overall clot burden is minimal.  No evidence of right heart strain. Heart is normal in size.  No pericardial effusion. No evidence of thoracic aortic aneurysm or dissection. Mediastinum/Nodes: No suspicious mediastinal lymphadenopathy. Visualized thyroid is unremarkable. Lungs/Pleura: Platelike scarring/atelectasis in the right lower lobe. No focal consolidation. No suspicious pulmonary nodules. No pleural effusion or  pneumothorax. Upper Abdomen: Visualized upper abdomen is notable for moderate to severe hepatic steatosis and prior cholecystectomy. Musculoskeletal: Mild degenerative changes of the mid thoracic spine. Review of the MIP images confirms the above findings. IMPRESSION: Tiny segmental/subsegmental pulmonary emboli within the right lower lobe pulmonary artery, stable versus mildly improved from recent CTA chest. Electronically Signed   By: Charline BillsSriyesh  Krishnan M.D.   On: 06/17/2018 03:29    EKG: Independently reviewed.   Assessment/Plan Active Problems:   SIRS (systemic inflammatory response syndrome) (HCC)  #1 sepsis/SIRS upon admission patient was tachycardic and febrile with a soft blood pressure.  She is also found to have elevated lactic acid level as well as profound leukocytosis.  The etiology is unknown at this time.  So far work-up is negative.  Chest x-ray negative UA  negative MRSA PCR negative influenza a and B- negative.  We will treated empirically with vancomycin and cefepime.  Patient had recent spinal fusion May 04, 2018.  Incision site looks clean.  No localized infection noted.  Follow-up blood cultures.  Follow-up CBC tomorrow.  #2 chronic pain/fibromyalgia/spinal stenosis on chronic narcotics continue.  Continue Neurontin and Dilaudid.  #3 AKI secondary to #1 hydrate overnight hold hydrochlorothiazide.  Patient looks more dry.  #4 PE continue Xarelto       Estimated body mass index is 39.72 kg/m as calculated from the following:   Height as of this encounter: 5\' 9"  (1.753 m).   Weight as of this encounter: 122 kg.   DVT prophylaxis: Xarelto Code Status: Full code Family Communication: No family available Disposition Plan: Pending clinical progress Consults called: None Admission status: Observation   Alwyn Ren MD [facility]  If 7PM-7AM, please contact night-coverage [email or web address] Password [email or web address]  06/17/2018, 4:40 PM

## 2018-06-17 NOTE — ED Triage Notes (Signed)
Pt c/o 8/10 central cp with SOB.

## 2018-06-17 NOTE — Telephone Encounter (Signed)
Copied from CRM [id number]. Topic: General - Other >> Jun 17, 2018  9:17 AM Gerrianne Scale wrote: Reason for CRM: PT Samyra from [facility] [phone] calling for verbal orders for monitor and exercises for once a week for 4 weeks

## 2018-06-17 NOTE — ED Notes (Signed)
ED Provider at bedside. 

## 2018-06-17 NOTE — ED Notes (Signed)
Date and time results received: 06/17/18 0153 (use smartphrase ".now" to insert current time)  Test: ISTAT Lactic Acid Critical Value: 2.36  Name of Provider Notified: Dr. Erlene Quan  Orders Received? Or Actions Taken?:

## 2018-06-18 DIAGNOSIS — R072 Precordial pain: Secondary | ICD-10-CM | POA: Diagnosis not present

## 2018-06-18 DIAGNOSIS — R5081 Fever presenting with conditions classified elsewhere: Secondary | ICD-10-CM | POA: Diagnosis not present

## 2018-06-18 DIAGNOSIS — M791 Myalgia, unspecified site: Secondary | ICD-10-CM | POA: Diagnosis not present

## 2018-06-18 LAB — COMPREHENSIVE METABOLIC PANEL
ALT: 18 U/L (ref 0–44)
AST: 17 U/L (ref 15–41)
Albumin: 3.5 g/dL (ref 3.5–5.0)
Alkaline Phosphatase: 47 U/L (ref 38–126)
Anion gap: 11 (ref 5–15)
BUN: 13 mg/dL (ref 6–20)
CO2: 27 mmol/L (ref 22–32)
Calcium: 8.4 mg/dL — ABNORMAL LOW (ref 8.9–10.3)
Chloride: 101 mmol/L (ref 98–111)
Creatinine, Ser: 1.01 mg/dL — ABNORMAL HIGH (ref 0.44–1.00)
GFR calc Af Amer: >60 mL/min (ref >60)
GFR calc non Af Amer: >60 mL/min (ref >60)
Glucose, Bld: 141 mg/dL — ABNORMAL HIGH (ref 70–99)
Potassium: 3.2 mmol/L — ABNORMAL LOW (ref 3.5–5.1)
Sodium: 139 mmol/L (ref 135–145)
Total Bilirubin: 0.5 mg/dL (ref 0.3–1.2)
Total Protein: 6.5 g/dL (ref 6.5–8.1)

## 2018-06-18 LAB — CBC
HCT: 39.5 % (ref 36.0–46.0)
Hemoglobin: 12.5 g/dL (ref 12.0–15.0)
MCH: 28.7 pg (ref 26.0–34.0)
MCHC: 31.6 g/dL (ref 30.0–36.0)
MCV: 90.6 fL (ref 80.0–100.0)
Platelets: 336 10*3/uL (ref 150–400)
RBC: 4.36 MIL/uL (ref 3.87–5.11)
RDW: 12.5 % (ref 11.5–15.5)
WBC: 9.4 10*3/uL (ref 4.0–10.5)
nRBC: 0 % (ref 0.0–0.2)

## 2018-06-18 MED ORDER — POTASSIUM CHLORIDE CRYS ER 20 MEQ PO TBCR
40.0000 meq | EXTENDED_RELEASE_TABLET | Freq: Once | ORAL | Status: AC
  Administered 2018-06-18: 40 meq via ORAL
  Filled 2018-06-18: qty 2

## 2018-06-18 NOTE — Progress Notes (Signed)
PROGRESS NOTE    [name]  VFI:[id number] DOB: [birth year] DOA: 06/17/2018 PCP: [name], MD  Brief Narrative: 43 y.o. female with medical history significant of recent spinal fusion May 04, 2018 followed by PE June 06, 2018 admitted with complaints of fever of 102 at home associated with shaking chills and chest pain.  She is unable to explain the chest pain in detail.  However she does say she had retrosternal chest pain radiating to the back with no aggravating or releasing and relieving factors.  She does have some pain now with but it is better than before.  She denies any cough she had myalgias.  She has no recent sick contacts.  She is started on Xarelto for the pulmonary embolism which is the only new medication that she has been started on.  Denies any diarrhea.  Denies any vomiting.  However she feels nauseous.  She has a history of cystitis postcoital but at this time she has no urinary complaints no dysuria or frequency.  Denies abdominal pain.  Does have history of chronic headache and migraines.  Today her heart rate was found to be in the 180s.  She does have a incision on the front and back which has been healed well  ED Course: Vancomycin cefepime and Flagyl.  Work-up in the ER included CT of the chest showed tiny segmental subsegmental pulmonary embolism within the right lower lobe pulmonary artery stable versus mildly improved from recent CT angiogram of the chest.  Chest x-ray showed no active pulmonary disease.  Sodium 134 potassium 3.9 BUN 15 creatinine 1.19 her creatinine on 06/08/2018 was 1.18.  White count was 25,000, white hemoglobin 15.1 platelet count 444.  Blood pressure was 06/24[birth year]1989 pulse was 116 temperature was 98.2 T-max was 101.9. She denies any changes with her vision or headache at this time denies any localized weakness.  06/18/2018 patient seen and examined.  She reports feeling having generalized weakness denies any nausea vomiting diarrhea cough.   Does complain of substernal chest pain radiating to the back which increases with breathing. Assessment & Plan:   Active Problems:   Myalgia   Tachycardia   SIRS (systemic inflammatory response syndrome) (HCC)   Fever   Precordial pain   Elevated lactic acid level   Bandemia  #1 sepsis/SIRS upon admission patient was tachycardic and febrile with a soft blood pressure.  She is also found to have elevated lactic acid level as well as profound leukocytosis upon admission which normalized to 9.4 after IV antibiotics.  This makes me think that she probably does have an infection which we have not picked up yet yet.  Continue empiric antibiotics for today.  Out of bed ambulate. The etiology is unknown at this time.  So far work-up is negative.  Chest x-ray negative UA negative MRSA PCR negative influenza a and B- negative.  We will treated empirically with vancomycin and cefepime.  Patient had recent spinal fusion May 04, 2018.  Incision site looks clean.  No localized infection noted.  Follow-up blood cultures.  Follow-up CBC tomorrow.  If she remains stable afebrile overnight we will plan for discharge tomorrow.  #2 chronic pain/fibromyalgia/spinal stenosis on chronic narcotics continue.  Continue Neurontin and Dilaudid.  #3 AKI secondary to #1 hydrate overnight hold hydrochlorothiazide.  Patient looks more dry.  Renal functions better with IV hydration.  #4 PE continue Xarelto  #5 hypokalemia replete and recheck.  Estimated body mass index is 39.72 kg/m as calculated from the  following:   Height as of this encounter: 5\' 9"  (1.753 m).   Weight as of this encounter: 122 kg.  DVT prophylaxis: Xarelto Code Status: Full code Family Communication: Husband was in the room Disposition Plan Pending clinical improvement possible discharge tomorrow  Consultants: None Procedures: None Antimicrobials: Vanco and cefepime Subjective: She does not feel back to her routine normal self has  increased pain in the back overnight and has generalized weakness with decreased appetite  Objective: Vitals:   06/17/18 1555 06/17/18 2143 06/18/18 0427 06/18/18 0730  BP: (!) 124/91 127/87 116/75 113/68  Pulse: (!) 116 (!) 101 78 94  Resp:  18 18   Temp: 98.2 F (36.8 C) 98.3 F (36.8 C) 98 F (36.7 C) (!) 97.4 F (36.3 C)  TempSrc: Oral   Oral  SpO2: 98% 97% 98% 99%  Weight:      Height:        Intake/Output Summary (Last 24 hours) at 06/18/2018 1024 Last data filed at 06/17/2018 1900 Gross per 24 hour  Intake 240 ml  Output -  Net 240 ml   Filed Weights   06/17/18 0037  Weight: 122 kg    Examination:  General exam: Appears calm and comfortable  Respiratory system: Clear to auscultation. Respiratory effort normal. Cardiovascular system: S1 & S2 heard, RRR. No JVD, murmurs, rubs, gallops or clicks. No pedal edema. Gastrointestinal system: Abdomen is nondistended, soft and nontender. No organomegaly or masses felt. Normal bowel sounds heard. Central nervous system: Alert and oriented. No focal neurological deficits. Extremities: Symmetric 5 x 5 power. Skin: No rashes, lesions or ulcers Psychiatry: Judgement and insight appear normal. Mood & affect appropriate.     Data Reviewed: I have personally reviewed following labs and imaging studies  CBC: Recent Labs  Lab 06/17/18 0047 06/18/18 0515  WBC 25.1* 9.4  HGB 15.1* 12.5  HCT 45.4 39.5  MCV 86.1 90.6  PLT 444* 336   Basic Metabolic Panel: Recent Labs  Lab 06/17/18 0047 06/18/18 0515  NA 134* 139  K 3.9 3.2*  CL 98 101  CO2 25 27  GLUCOSE 176* 141*  BUN 15 13  CREATININE 1.19* 1.01*  CALCIUM 9.3 8.4*   GFR: Estimated Creatinine Clearance: 100.3 mL/min (A) (by C-G formula based on SCr of 1.01 mg/dL (H)). Liver Function Tests: Recent Labs  Lab 06/18/18 0515  AST 17  ALT 18  ALKPHOS 47  BILITOT 0.5  PROT 6.5  ALBUMIN 3.5   No results for input(s): LIPASE, AMYLASE in the last 168 hours. No  results for input(s): AMMONIA in the last 168 hours. Coagulation Profile: No results for input(s): INR, PROTIME in the last 168 hours. Cardiac Enzymes: Recent Labs  Lab 06/17/18 0047  TROPONINI <0.03   BNP (last 3 results) No results for input(s): PROBNP in the last 8760 hours. HbA1C: No results for input(s): HGBA1C in the last 72 hours. CBG: No results for input(s): GLUCAP in the last 168 hours. Lipid Profile: No results for input(s): CHOL, HDL, LDLCALC, TRIG, CHOLHDL, LDLDIRECT in the last 72 hours. Thyroid Function Tests: No results for input(s): TSH, T4TOTAL, FREET4, T3FREE, THYROIDAB in the last 72 hours. Anemia Panel: No results for input(s): VITAMINB12, FOLATE, FERRITIN, TIBC, IRON, RETICCTPCT in the last 72 hours. Sepsis Labs: Recent Labs  Lab 06/17/18 0149 06/17/18 0351  LATICACIDVEN 2.36* 1.59    No results found for this or any previous visit (from the past 240 hour(s)).       Radiology Studies: Dg Chest  2 View  Result Date: 06/17/2018 CLINICAL DATA:  43 year old female with chest pain. EXAM: CHEST - 2 VIEW COMPARISON:  Chest CT dated 06/06/2018 FINDINGS: The heart size and mediastinal contours are within normal limits. Both lungs are clear. The visualized skeletal structures are unremarkable. IMPRESSION: No active cardiopulmonary disease. Electronically Signed   By: Elgie CollardArash  Radparvar M.D.   On: 06/17/2018 01:35   Ct Angio Chest Pe W And/or Wo Contrast  Result Date: 06/17/2018 CLINICAL DATA:  Chest pain, shortness of breath, history of right lower lobe pulmonary emboli EXAM: CT ANGIOGRAPHY CHEST WITH CONTRAST TECHNIQUE: Multidetector CT imaging of the chest was performed using the standard protocol during bolus administration of intravenous contrast. Multiplanar CT image reconstructions and MIPs were obtained to evaluate the vascular anatomy. CONTRAST:  100mL ISOVUE-370 IOPAMIDOL (ISOVUE-370) INJECTION 76% COMPARISON:  CTA chest dated 06/06/2018 FINDINGS:  Cardiovascular: Satisfactory opacification of the bilateral pulmonary arteries to the segmental level. Tiny segmental filling defect within the right lower lobe pulmonary artery (series 12/image 155). Additional tiny subsegmental filling defect with a right lower lobe pulmonary artery (series 12/image 133). This appearance is similar versus mildly improved from the prior. Overall clot burden is minimal.  No evidence of right heart strain. Heart is normal in size.  No pericardial effusion. No evidence of thoracic aortic aneurysm or dissection. Mediastinum/Nodes: No suspicious mediastinal lymphadenopathy. Visualized thyroid is unremarkable. Lungs/Pleura: Platelike scarring/atelectasis in the right lower lobe. No focal consolidation. No suspicious pulmonary nodules. No pleural effusion or pneumothorax. Upper Abdomen: Visualized upper abdomen is notable for moderate to severe hepatic steatosis and prior cholecystectomy. Musculoskeletal: Mild degenerative changes of the mid thoracic spine. Review of the MIP images confirms the above findings. IMPRESSION: Tiny segmental/subsegmental pulmonary emboli within the right lower lobe pulmonary artery, stable versus mildly improved from recent CTA chest. Electronically Signed   By: Charline BillsSriyesh  Krishnan M.D.   On: 06/17/2018 03:29        Scheduled Meds: . [START ON 06/20/2018] cyanocobalamin  1,000 mcg Intramuscular Weekly  . fluticasone  2 spray Each Nare Daily  . gabapentin  300 mg Oral TID  . loratadine  10 mg Oral Daily  . pantoprazole  40 mg Oral Daily  . Rivaroxaban  15 mg Oral BID WC  . sodium chloride flush  3 mL Intravenous Once  . [START ON 06/20/2018] Vitamin D (Ergocalciferol)  50,000 Units Oral Q7 days   Continuous Infusions: . sodium chloride Stopped (06/18/18 0647)  . ceFEPime (MAXIPIME) IV 2 g (06/17/18 2203)  . metronidazole 500 mg (06/18/18 0335)  . vancomycin 1,750 mg (06/17/18 2303)  . vancomycin       LOS: 0 days     Alwyn RenElizabeth G  Yemariam Magar, MD [facility]  If 7PM-7AM, please contact night-coverage [email or web address] Password [email or web address] 06/18/2018, 10:24 AM

## 2018-06-19 DIAGNOSIS — R072 Precordial pain: Secondary | ICD-10-CM | POA: Diagnosis not present

## 2018-06-19 DIAGNOSIS — R5081 Fever presenting with conditions classified elsewhere: Secondary | ICD-10-CM | POA: Diagnosis not present

## 2018-06-19 DIAGNOSIS — R000 Tachycardia, unspecified: Secondary | ICD-10-CM | POA: Diagnosis not present

## 2018-06-19 DIAGNOSIS — M791 Myalgia, unspecified site: Secondary | ICD-10-CM | POA: Diagnosis not present

## 2018-06-19 LAB — CBC
HCT: 35 % — ABNORMAL LOW (ref 36.0–46.0)
Hemoglobin: 11.2 g/dL — ABNORMAL LOW (ref 12.0–15.0)
MCH: 29.4 pg (ref 26.0–34.0)
MCHC: 32 g/dL (ref 30.0–36.0)
MCV: 91.9 fL (ref 80.0–100.0)
Platelets: 260 10*3/uL (ref 150–400)
RBC: 3.81 MIL/uL — ABNORMAL LOW (ref 3.87–5.11)
RDW: 12.4 % (ref 11.5–15.5)
WBC: 7.2 10*3/uL (ref 4.0–10.5)
nRBC: 0 % (ref 0.0–0.2)

## 2018-06-19 LAB — COMPREHENSIVE METABOLIC PANEL
ALT: 13 U/L (ref 0–44)
AST: 13 U/L — ABNORMAL LOW (ref 15–41)
Albumin: 2.9 g/dL — ABNORMAL LOW (ref 3.5–5.0)
Alkaline Phosphatase: 46 U/L (ref 38–126)
Anion gap: 8 (ref 5–15)
BUN: 10 mg/dL (ref 6–20)
CO2: 25 mmol/L (ref 22–32)
Calcium: 8.1 mg/dL — ABNORMAL LOW (ref 8.9–10.3)
Chloride: 109 mmol/L (ref 98–111)
Creatinine, Ser: 0.84 mg/dL (ref 0.44–1.00)
GFR calc Af Amer: >60 mL/min (ref >60)
GFR calc non Af Amer: >60 mL/min (ref >60)
Glucose, Bld: 141 mg/dL — ABNORMAL HIGH (ref 70–99)
Potassium: 3.4 mmol/L — ABNORMAL LOW (ref 3.5–5.1)
Sodium: 142 mmol/L (ref 135–145)
Total Bilirubin: 0.2 mg/dL — ABNORMAL LOW (ref 0.3–1.2)
Total Protein: 5.8 g/dL — ABNORMAL LOW (ref 6.5–8.1)

## 2018-06-19 MED ORDER — POTASSIUM CHLORIDE CRYS ER 20 MEQ PO TBCR
40.0000 meq | EXTENDED_RELEASE_TABLET | Freq: Once | ORAL | Status: AC
  Administered 2018-06-19: 40 meq via ORAL
  Filled 2018-06-19: qty 2

## 2018-06-19 MED ORDER — FLUCONAZOLE 100 MG PO TABS: 100.0000 mg | ORAL_TABLET | Freq: Every day | ORAL | 0 refills | Status: AC

## 2018-06-19 NOTE — Care Management (Signed)
Pt active with [facility] home health services. Does not need resumption orders due to being in observation status. Sandford Craze RN,BSN [phone]

## 2018-06-19 NOTE — Discharge Summary (Signed)
Physician Discharge Summary  [name] JYN:[id number] DOB: 08/31/1975 DOA: 06/17/2018  PCP: Nelwyn SalisburyFry, [name], MD  Admit date: 06/17/2018 Discharge date: 06/19/2018  Admitted From: home Disposition:home Recommendations for Outpatient Follow-up:  1. Follow up with PCP in 1-2 weeks 2. Please obtain BMP/CBC in one week   Home Health none Equipment/Devices none Discharge Condition stable CODE STATUS full code Diet recommendation: Cardiac Brief/Interim Summary:42 y.o.femalewith medical history significant ofrecent spinal fusion May 04, 2018 followed by PE June 06, 2018 admitted with complaints of fever of 102 at home associated with shaking chills and chest pain. She is unable to explain the chest pain in detail. However she does say she had retrosternal chest pain radiating to the back with no aggravating or releasing and relieving factors. She does have some pain now with but it is better than before. She denies any cough she had myalgias. She has no recent sick contacts. She is started on Xarelto for the pulmonary embolism which is the only new medication that she has been started on. Denies any diarrhea. Denies any vomiting. However she feels nauseous. She has a history of cystitis postcoital but at this time she has no urinary complaints no dysuria or frequency. Denies abdominal pain. Does have history of chronic headache and migraines. Today her heart rate was found to be in the 180s. She does have a incision on the front and back which has been healed well  ED Course:Vancomycin cefepime and Flagyl. Work-up in the ER included CT of the chest showed tiny segmental subsegmental pulmonary embolism within the right lower lobe pulmonary artery stable versus mildly improved from recent CT angiogram of the chest. Chest x-ray showed no active pulmonary disease. Sodium 134 potassium 3.9 BUN 15 creatinine 1.19 her creatinine on 06/08/2018 was 1.18. White count was 25,000, white  hemoglobin 15.1 platelet count 444. Blood pressure was 06/24/1989 pulse was 116 temperature was 98.2 T-max was 101.9. She denies any changes with her vision or headache at this time denies any localized weakness.  Discharge Diagnoses:  Active Problems:   Myalgia   Tachycardia   SIRS (systemic inflammatory response syndrome) (HCC)   Fever   Precordial pain   Elevated lactic acid level   Bandemia   #1 sepsis/SIRS upon admission patient was tachycardic and febrile with a soft blood pressure. She was also found to have elevated lactic acid level as well as profound leukocytosis upon admission which normalized to 9.4 after IV antibiotics.  This makes me think that she probably does have an infection which we have not picked up yet yet.  She was treated with empiric antibiotics Vanco cefepime.  Received a dose of Flagyl in the ER.  Cultures remain negative.  Leukocytosis improved.  Patient is able to ambulate in the hallway without any difficulty on the day of discharge.  She does not have any further fever since admission.  She will be discharged home today on no antibiotics. So far work-up is negative. Chest x-ray negative UA negative MRSA PCR negative influenza a and B- negative.  Patient had recent spinal fusion May 04, 2018. Incision site looks clean. No localized infection noted.   #2 chronic pain/fibromyalgia/spinal stenosis on chronic narcotics continue. Continue Neurontin and Dilaudid.  #3 AKI solved with IV hydration and holding hydrochlorothiazide.  #4 PE continue Xarelto  #5 hypokalemia repleted   Estimated body mass index is 39.72 kg/m as calculated from the following:   Height as of this encounter: 5\' 9"  (1.753 m).   Weight  as of this encounter: 122 kg.  Discharge Instructions  Discharge Instructions    Call MD for:  difficulty breathing, headache or visual disturbances   Complete by:  As directed    Call MD for:  persistant dizziness or light-headedness    Complete by:  As directed    Call MD for:  persistant nausea and vomiting   Complete by:  As directed    Call MD for:  severe uncontrolled pain   Complete by:  As directed    Diet - low sodium heart healthy   Complete by:  As directed    Increase activity slowly   Complete by:  As directed      Allergies as of 06/19/2018      Reactions   Gadolinium Derivatives Swelling, Other (See Comments)   Arm swelling, tingling, & redness. Radiologist Dr. Grace Isaac recommends an injection of steroids, or 13-hour prep if non-emergent, if patient needs a gadolinium based contrast in the future.   Other Rash, Other (See Comments)   Glue or tape to seal up wound   Zoloft [sertraline Hcl] Other (See Comments)   Hair loss      Medication List    TAKE these medications   ALPRAZolam 0.5 MG tablet Commonly known as:  XANAX TAKE 1 TABLET BY MOUTH THREE TIMES A DAY AS NEEDED What changed:  reasons to take this   calcium carbonate 500 MG chewable tablet Commonly known as:  TUMS - dosed in mg elemental calcium Chew 1 tablet by mouth 2 (two) times daily as needed for indigestion or heartburn.   cetirizine 10 MG tablet Commonly known as:  ZYRTEC Take 10 mg by mouth daily.   cyanocobalamin 1000 MCG/ML injection Commonly known as:  (VITAMIN B-12) INJECT 1 ML (1,000 MCG TOTAL) INTO THE MUSCLE EVERY 7 DAYS. What changed:  See the new instructions.   cyclobenzaprine 10 MG tablet Commonly known as:  FLEXERIL Take 10 mg by mouth 3 (three) times daily as needed for muscle spasms.   docusate sodium 100 MG capsule Commonly known as:  COLACE Take 100 mg by mouth at bedtime.   fluticasone 50 MCG/ACT nasal spray Commonly known as:  FLONASE Place 2 sprays into both nostrils every evening.   gabapentin 300 MG capsule Commonly known as:  NEURONTIN Take 1 capsule (300 mg total) by mouth 3 (three) times daily.   hydrochlorothiazide 25 MG tablet Commonly known as:  HYDRODIURIL TAKE 1 TABLET BY MOUTH EVERY  DAY   HYDROmorphone 2 MG tablet Commonly known as:  DILAUDID Take 0.5 tablets (1 mg total) by mouth every 6 (six) hours as needed for severe pain.   MULTI-VITAMIN GUMMIES PO Take 2 tablets by mouth daily.   nitrofurantoin (macrocrystal-monohydrate) 100 MG capsule Commonly known as:  MACROBID Take 100 mg by mouth daily as needed (for uti prophylaxis, after intercourse).   oxyCODONE 15 MG immediate release tablet Commonly known as:  ROXICODONE Take 1 tablet (15 mg total) by mouth every 4 (four) hours as needed for up to 30 days for pain. Start taking on:  August 09, 2018   pantoprazole 40 MG tablet Commonly known as:  PROTONIX Take 1 tablet (40 mg total) by mouth daily.   potassium chloride 10 MEQ tablet Commonly known as:  KLOR-CON 10 Take 1 tablet (10 mEq total) by mouth daily. What changed:    when to take this  reasons to take this   promethazine 25 MG tablet Commonly known as:  PHENERGAN TAKE 1  TABLET BY MOUTH EVERY 6 HOURS AS NEEDED FOR NAUSEA What changed:    reasons to take this  additional instructions   Rivaroxaban 15 & 20 MG Tbpk Take as directed on package: Start with one 15mg  tablet by mouth twice a day with food. On Day 22, switch to one 20mg  tablet once a day with food. Pharmacy: Please discard first tablet from the pack which she has received in the hospital.   simethicone 80 MG chewable tablet Commonly known as:  MYLICON Chew 160 mg by mouth every 6 (six) hours as needed (gas pain).   SUMAtriptan 100 MG tablet Commonly known as:  IMITREX Take 1 tablet earliest onset of migraine.  May repeat once in 2 hours if headache persists or recurs. What changed:    how much to take  how to take this  when to take this  reasons to take this   SYRINGE-NEEDLE (DISP) 3 ML 25G X 1" 3 ML Misc Commonly known as:  B-D 3CC LUER-LOK SYR 25GX1" USE AS DIRECTED What changed:  Another medication with the same name was removed. Continue taking this medication, and  follow the directions you see here.   Vitamin D (Ergocalciferol) 1.25 MG (50000 UT) Caps capsule Commonly known as:  DRISDOL TAKE 1 CAPSULE (50,000 UNITS TOTAL) BY MOUTH EVERY 7 (SEVEN) DAYS.      Follow-up Information    [name], MD Follow up.   Specialty:  Family Medicine Contact information: [address] [place] [address] [phone]          Allergies  Allergen Reactions  . Gadolinium Derivatives Swelling and Other (See Comments)    Arm swelling, tingling, & redness. Radiologist Dr. Grace Isaac recommends an injection of steroids, or 13-hour prep if non-emergent, if patient needs a gadolinium based contrast in the future.  . Other Rash and Other (See Comments)    Glue or tape to seal up wound  . Zoloft [Sertraline Hcl] Other (See Comments)    Hair loss    Consultations: none  Procedures/Studies: Dg Chest 2 View  Result Date: 06/17/2018 CLINICAL DATA:  43 year old female with chest pain. EXAM: CHEST - 2 VIEW COMPARISON:  Chest CT dated 06/06/2018 FINDINGS: The heart size and mediastinal contours are within normal limits. Both lungs are clear. The visualized skeletal structures are unremarkable. IMPRESSION: No active cardiopulmonary disease. Electronically Signed   By: Elgie Collard M.D.   On: 06/17/2018 01:35   Ct Angio Chest Pe W And/or Wo Contrast  Result Date: 06/17/2018 CLINICAL DATA:  Chest pain, shortness of breath, history of right lower lobe pulmonary emboli EXAM: CT ANGIOGRAPHY CHEST WITH CONTRAST TECHNIQUE: Multidetector CT imaging of the chest was performed using the standard protocol during bolus administration of intravenous contrast. Multiplanar CT image reconstructions and MIPs were obtained to evaluate the vascular anatomy. CONTRAST:  ISOVUE-370 IOPAMIDOL (ISOVUE-370) INJECTION 76% COMPARISON:  CTA chest dated 06/06/2018 FINDINGS: Cardiovascular: Satisfactory opacification of the bilateral pulmonary arteries to the segmental level.  Tiny segmental filling defect within the right lower lobe pulmonary artery (series 12/image 155). Additional tiny subsegmental filling defect with a right lower lobe pulmonary artery (series 12/image 133). This appearance is similar versus mildly improved from the prior. Overall clot burden is minimal.  No evidence of right heart strain. Heart is normal in size.  No pericardial effusion. No evidence of thoracic aortic aneurysm or dissection. Mediastinum/Nodes: No suspicious mediastinal lymphadenopathy. Visualized thyroid is unremarkable. Lungs/Pleura: Platelike scarring/atelectasis in the right lower lobe. No  focal consolidation. No suspicious pulmonary nodules. No pleural effusion or pneumothorax. Upper Abdomen: Visualized upper abdomen is notable for moderate to severe hepatic steatosis and prior cholecystectomy. Musculoskeletal: Mild degenerative changes of the mid thoracic spine. Review of the MIP images confirms the above findings. IMPRESSION: Tiny segmental/subsegmental pulmonary emboli within the right lower lobe pulmonary artery, stable versus mildly improved from recent CTA chest. Electronically Signed   By: Charline Bills M.D.   On: 06/17/2018 03:29   Ct Angio Chest Pe W Or Wo Contrast  Result Date: 06/06/2018 CLINICAL DATA:  Shortness of breath and tachycardia EXAM: CT ANGIOGRAPHY CHEST WITH CONTRAST TECHNIQUE: Multidetector CT imaging of the chest was performed using the standard protocol during bolus administration of intravenous contrast. Multiplanar CT image reconstructions and MIPs were obtained to evaluate the vascular anatomy. CONTRAST:  69 mL ISOVUE-370 IOPAMIDOL (ISOVUE-370) INJECTION 76% COMPARISON:  CT angiogram chest September 03, 2016 FINDINGS: Cardiovascular: There are incompletely obstructing pulmonary emboli in branches of the right lower lobe pulmonary artery. No more central pulmonary emboli evident. There is no appreciable right heart strain. There is no appreciable thoracic aortic  aneurysm or dissection. Visualized great vessels appear unremarkable. There is no pericardial effusion or pericardial thickening evident. Mediastinum/Nodes: Visualized thyroid appears normal. There is no appreciable thoracic adenopathy. No esophageal lesions are appreciable. Lungs/Pleura: There is atelectatic change in the lower lobe regions. There is no edema or consolidation. No pleural effusion or pleural thickening evident. Upper Abdomen: There is hepatic steatosis. Visualized upper abdominal structures otherwise appear unremarkable. Musculoskeletal: There are foci of degenerative change in the thoracic spine. There are no appreciable blastic or lytic bone lesions. No chest wall lesions are evident. Review of the MIP images confirms the above findings. IMPRESSION: 1. Foci of incompletely obstructing pulmonary embolus in lower lobe pulmonary artery branches on the right. No more central pulmonary embolus evident. No thoracic aortic aneurysm or dissection. No right heart strain. 2.  Mild bibasilar atelectasis.  No lung edema or consolidation. 3.  No appreciable thoracic adenopathy. 4.  Hepatic steatosis. Critical Value/emergent results were called by telephone at the time of interpretation on 06/06/2018 at 5:29 pm to Dr. Jacalyn Lefevre , who verbally acknowledged these results. Electronically Signed   By: Bretta Bang III M.D.   On: 06/06/2018 17:29   Vas [place] Lower Extremity Venous (dvt)  Result Date: 06/07/2018  Lower Venous Study Indications: Pulmonary embolism.  Performing Technologist: Chanda Busing RVT  Examination Guidelines: A complete evaluation includes B-mode imaging, spectral Doppler, color Doppler, and power Doppler as needed of all accessible portions of each vessel. Bilateral testing is considered an integral part of a complete examination. Limited examinations for reoccurring indications may be performed as noted.  Right Venous Findings:  +---------+---------------+---------+-----------+----------+-------+          CompressibilityPhasicitySpontaneityPropertiesSummary +---------+---------------+---------+-----------+----------+-------+ CFV      Full           Yes      Yes                          +---------+---------------+---------+-----------+----------+-------+ SFJ      Full                                                 +---------+---------------+---------+-----------+----------+-------+ FV Prox  Full                                                 +---------+---------------+---------+-----------+----------+-------+  FV Mid   Full                                                 +---------+---------------+---------+-----------+----------+-------+ FV DistalFull                                                 +---------+---------------+---------+-----------+----------+-------+ PFV      Full                                                 +---------+---------------+---------+-----------+----------+-------+ POP      Full           Yes      Yes                          +---------+---------------+---------+-----------+----------+-------+ PTV      Full                                                 +---------+---------------+---------+-----------+----------+-------+ PERO     Full                                                 +---------+---------------+---------+-----------+----------+-------+  Left Venous Findings: +---------+---------------+---------+-----------+----------+-------+          CompressibilityPhasicitySpontaneityPropertiesSummary +---------+---------------+---------+-----------+----------+-------+ CFV      Full           Yes      Yes                          +---------+---------------+---------+-----------+----------+-------+ SFJ      Full                                                  +---------+---------------+---------+-----------+----------+-------+ FV Prox  Full                                                 +---------+---------------+---------+-----------+----------+-------+ FV Mid   Full                                                 +---------+---------------+---------+-----------+----------+-------+ FV DistalFull                                                 +---------+---------------+---------+-----------+----------+-------+  PFV      Full                                                 +---------+---------------+---------+-----------+----------+-------+ POP      Full           Yes      Yes                          +---------+---------------+---------+-----------+----------+-------+ PTV      Full                                                 +---------+---------------+---------+-----------+----------+-------+ PERO     Full                                                 +---------+---------------+---------+-----------+----------+-------+    Summary: Right: There is no evidence of deep vein thrombosis in the lower extremity. No cystic structure found in the popliteal fossa. Left: There is no evidence of deep vein thrombosis in the lower extremity. No cystic structure found in the popliteal fossa.  *See table(s) above for measurements and observations. Electronically signed by Tonny Bollman MD on 06/07/2018 at 8:06:17 PM.    Final     (Echo, Carotid, EGD, Colonoscopy, ERCP)    Subjective:   Discharge Exam: Vitals:   06/18/18 2041 06/19/18 0501  BP: 119/80 112/64  Pulse: 97 89  Resp: 20 18  Temp: 98.2 F (36.8 C) 98.3 F (36.8 C)  SpO2: 97% 96%   Vitals:   06/18/18 0730 06/18/18 1340 06/18/18 2041 06/19/18 0501  BP: 113/68 133/82 119/80 112/64  Pulse: 94 (!) 103 97 89  Resp:   20 18  Temp: (!) 97.4 F (36.3 C) (!) 97.5 F (36.4 C) 98.2 F (36.8 C) 98.3 F (36.8 C)  TempSrc: Oral Oral Oral Oral  SpO2: 99% 97%  97% 96%  Weight:      Height:        General: Pt is alert, awake, not in acute distress Cardiovascular: RRR, S1/S2 +, no rubs, no gallops Respiratory: CTA bilaterally, no wheezing, no rhonchi Abdominal: Soft, NT, ND, bowel sounds + Extremities: no edema, no cyanosis    The results of significant diagnostics from this hospitalization (including imaging, microbiology, ancillary and laboratory) are listed below for reference.     Microbiology: Recent Results (from the past 240 hour(s))  Blood Culture (routine x 2)     Status: None (Preliminary result)   Collection Time: 06/17/18  2:04 AM  Result Value Ref Range Status   Specimen Description   Final    BLOOD RIGHT ANTECUBITAL Performed at [facility], [address] [place], [place] [address]    Special Requests   Final    BOTTLES DRAWN AEROBIC AND ANAEROBIC Blood Culture adequate volume Performed at [facility], [address], [place], [place] [address]    Culture   Final    NO GROWTH 1 DAY Performed at [facility], [address], [place],  [place] [address]    Report Status PENDING  Incomplete  Blood Culture (routine x 2)     Status: None (Preliminary result)   Collection Time: 06/17/18  2:09 AM  Result Value Ref Range Status   Specimen Description   Final    BLOOD LEFT FOREARM Performed at [facility] [place], [address] [place], [place] [address]    Special Requests   Final    BOTTLES DRAWN AEROBIC AND ANAEROBIC Blood Culture adequate volume Performed at [facility] [place], [address] [place], [place] [address]    Culture   Final    NO GROWTH 1 DAY Performed at [facility], [address] [place], [place] [address]    Report Status PENDING  Incomplete     Labs: BNP (last 3 results) Recent Labs    06/06/18 1605  BNP 11.9   Basic Metabolic Panel: Recent Labs  Lab 06/17/18 0047 06/18/18 0515 06/19/18 0546  NA 134* 139 142  K 3.9 3.2* 3.4*  CL  98 101 109  CO2 25 27 25   GLUCOSE 176* 141* 141*  BUN 15 13 10   CREATININE 1.19* 1.01* 0.84  CALCIUM 9.3 8.4* 8.1*   Liver Function Tests: Recent Labs  Lab 06/18/18 0515 06/19/18 0546  AST 17 13*  ALT 18 13  ALKPHOS 47 46  BILITOT 0.5 0.2*  PROT 6.5 5.8*  ALBUMIN 3.5 2.9*   No results for input(s): LIPASE, AMYLASE in the last 168 hours. No results for input(s): AMMONIA in the last 168 hours. CBC: Recent Labs  Lab 06/17/18 0047 06/18/18 0515 06/19/18 0546  WBC 25.1* 9.4 7.2  HGB 15.1* 12.5 11.2*  HCT 45.4 39.5 35.0*  MCV 86.1 90.6 91.9  PLT 444* 336 260   Cardiac Enzymes: Recent Labs  Lab 06/17/18 0047  TROPONINI <0.03   BNP: Invalid input(s): POCBNP CBG: No results for input(s): GLUCAP in the last 168 hours. D-Dimer No results for input(s): DDIMER in the last 72 hours. Hgb A1c No results for input(s): HGBA1C in the last 72 hours. Lipid Profile No results for input(s): CHOL, HDL, LDLCALC, TRIG, CHOLHDL, LDLDIRECT in the last 72 hours. Thyroid function studies No results for input(s): TSH, T4TOTAL, T3FREE, THYROIDAB in the last 72 hours.  Invalid input(s): FREET3 Anemia work up No results for input(s): VITAMINB12, FOLATE, FERRITIN, TIBC, IRON, RETICCTPCT in the last 72 hours. Urinalysis    Component Value Date/Time   COLORURINE YELLOW 06/17/2018 0406   APPEARANCEUR CLEAR 06/17/2018 0406   LABSPEC <1.005 (L) 06/17/2018 0406   PHURINE 6.5 06/17/2018 0406   GLUCOSEU NEGATIVE 06/17/2018 0406   HGBUR NEGATIVE 06/17/2018 0406   HGBUR negative 04/04/2007 1513   BILIRUBINUR NEGATIVE 06/17/2018 0406   BILIRUBINUR negative 12/24/2017 1316   KETONESUR NEGATIVE 06/17/2018 0406   PROTEINUR NEGATIVE 06/17/2018 0406   UROBILINOGEN 0.2 12/24/2017 1316   UROBILINOGEN 0.2 04/08/2015 1242   NITRITE NEGATIVE 06/17/2018 0406   LEUKOCYTESUR NEGATIVE 06/17/2018 0406   Sepsis Labs Invalid input(s): PROCALCITONIN,  WBC,  LACTICIDVEN Microbiology Recent Results (from  the past 240 hour(s))  Blood Culture (routine x 2)     Status: None (Preliminary result)   Collection Time: 06/17/18  2:04 AM  Result Value Ref Range Status   Specimen Description   Final    BLOOD RIGHT ANTECUBITAL Performed at [facility] [place], [address] [place], [place] [address]    Special Requests   Final    BOTTLES DRAWN AEROBIC  AND ANAEROBIC Blood Culture adequate volume Performed at [facility], [address] [place], [place] [address]    Culture   Final    NO GROWTH 1 DAY Performed at [facility], [address] [place], [place], [place] [address]    Report Status PENDING  Incomplete  Blood Culture (routine x 2)     Status: None (Preliminary result)   Collection Time: 06/17/18  2:09 AM  Result Value Ref Range Status   Specimen Description   Final    BLOOD LEFT FOREARM Performed at [facility], [address] [place], [place] [address]    Special Requests   Final    BOTTLES DRAWN AEROBIC AND ANAEROBIC Blood Culture adequate volume Performed at [facility], [address] [place], [place] [address]    Culture   Final    NO GROWTH 1 DAY Performed at [facility], [address] [place], [place] [address]    Report Status PENDING  Incomplete     Time coordinating discharge: 34  minutes  SIGNED:   Alwyn Ren, MD  [facility] 06/19/2018, 10:04 AM Pager   If 7PM-7AM, please contact night-coverage [email or web address] Password [email or web address]

## 2018-06-21 ENCOUNTER — Observation Stay (HOSPITAL_COMMUNITY)
Admission: EM | Admit: 2018-06-21 | Discharge: 2018-06-22 | Disposition: A | Discharge status: Home / Self Care | Payer: BLUE CROSS/BLUE SHIELD | Attending: Family Medicine | Admitting: Family Medicine

## 2018-06-21 ENCOUNTER — Other Ambulatory Visit: Payer: Self-pay

## 2018-06-21 ENCOUNTER — Encounter (HOSPITAL_COMMUNITY): Payer: Self-pay

## 2018-06-21 ENCOUNTER — Emergency Department (HOSPITAL_COMMUNITY): Payer: BLUE CROSS/BLUE SHIELD

## 2018-06-21 ENCOUNTER — Ambulatory Visit: Payer: Self-pay

## 2018-06-21 DIAGNOSIS — Z79891 Long term (current) use of opiate analgesic: Secondary | ICD-10-CM | POA: Diagnosis not present

## 2018-06-21 DIAGNOSIS — I10 Essential (primary) hypertension: Secondary | ICD-10-CM | POA: Diagnosis not present

## 2018-06-21 DIAGNOSIS — I160 Hypertensive urgency: Secondary | ICD-10-CM | POA: Diagnosis not present

## 2018-06-21 DIAGNOSIS — F329 Major depressive disorder, single episode, unspecified: Secondary | ICD-10-CM | POA: Diagnosis not present

## 2018-06-21 DIAGNOSIS — F411 Generalized anxiety disorder: Secondary | ICD-10-CM | POA: Diagnosis not present

## 2018-06-21 DIAGNOSIS — Z79899 Other long term (current) drug therapy: Secondary | ICD-10-CM | POA: Insufficient documentation

## 2018-06-21 DIAGNOSIS — M549 Dorsalgia, unspecified: Secondary | ICD-10-CM | POA: Insufficient documentation

## 2018-06-21 DIAGNOSIS — G8929 Other chronic pain: Secondary | ICD-10-CM | POA: Diagnosis not present

## 2018-06-21 DIAGNOSIS — M791 Myalgia, unspecified site: Secondary | ICD-10-CM | POA: Diagnosis not present

## 2018-06-21 DIAGNOSIS — Z7901 Long term (current) use of anticoagulants: Secondary | ICD-10-CM | POA: Insufficient documentation

## 2018-06-21 DIAGNOSIS — R509 Fever, unspecified: Secondary | ICD-10-CM | POA: Diagnosis present

## 2018-06-21 DIAGNOSIS — Z87891 Personal history of nicotine dependence: Secondary | ICD-10-CM | POA: Diagnosis not present

## 2018-06-21 DIAGNOSIS — R000 Tachycardia, unspecified: Secondary | ICD-10-CM | POA: Diagnosis not present

## 2018-06-21 DIAGNOSIS — D72829 Elevated white blood cell count, unspecified: Secondary | ICD-10-CM | POA: Diagnosis not present

## 2018-06-21 DIAGNOSIS — G43709 Chronic migraine without aura, not intractable, without status migrainosus: Secondary | ICD-10-CM | POA: Diagnosis not present

## 2018-06-21 DIAGNOSIS — K219 Gastro-esophageal reflux disease without esophagitis: Secondary | ICD-10-CM | POA: Diagnosis not present

## 2018-06-21 DIAGNOSIS — E538 Deficiency of other specified B group vitamins: Secondary | ICD-10-CM | POA: Diagnosis not present

## 2018-06-21 DIAGNOSIS — E785 Hyperlipidemia, unspecified: Secondary | ICD-10-CM | POA: Insufficient documentation

## 2018-06-21 DIAGNOSIS — A419 Sepsis, unspecified organism: Secondary | ICD-10-CM | POA: Insufficient documentation

## 2018-06-21 DIAGNOSIS — M170 Bilateral primary osteoarthritis of knee: Secondary | ICD-10-CM | POA: Diagnosis not present

## 2018-06-21 DIAGNOSIS — G629 Polyneuropathy, unspecified: Secondary | ICD-10-CM | POA: Diagnosis not present

## 2018-06-21 DIAGNOSIS — I2699 Other pulmonary embolism without acute cor pulmonale: Secondary | ICD-10-CM | POA: Diagnosis present

## 2018-06-21 DIAGNOSIS — M48061 Spinal stenosis, lumbar region without neurogenic claudication: Secondary | ICD-10-CM | POA: Insufficient documentation

## 2018-06-21 DIAGNOSIS — K5900 Constipation, unspecified: Secondary | ICD-10-CM | POA: Diagnosis not present

## 2018-06-21 DIAGNOSIS — Z9181 History of falling: Secondary | ICD-10-CM | POA: Diagnosis not present

## 2018-06-21 DIAGNOSIS — G43909 Migraine, unspecified, not intractable, without status migrainosus: Secondary | ICD-10-CM | POA: Diagnosis present

## 2018-06-21 DIAGNOSIS — M797 Fibromyalgia: Secondary | ICD-10-CM | POA: Diagnosis not present

## 2018-06-21 DIAGNOSIS — R0600 Dyspnea, unspecified: Secondary | ICD-10-CM | POA: Diagnosis not present

## 2018-06-21 DIAGNOSIS — Z981 Arthrodesis status: Secondary | ICD-10-CM | POA: Diagnosis not present

## 2018-06-21 HISTORY — DX: Other pulmonary embolism without acute cor pulmonale: I26.99

## 2018-06-21 LAB — CBC WITH DIFFERENTIAL/PLATELET
Abs Immature Granulocytes: 0.05 10*3/uL (ref 0.00–0.07)
Basophils Absolute: 0.1 10*3/uL (ref 0.0–0.1)
Basophils Relative: 1 %
Eosinophils Absolute: 0.3 10*3/uL (ref 0.0–0.5)
Eosinophils Relative: 2 %
HCT: 45 % (ref 36.0–46.0)
Hemoglobin: 14.9 g/dL (ref 12.0–15.0)
Immature Granulocytes: 0 %
Lymphocytes Relative: 28 %
Lymphs Abs: 3.2 10*3/uL (ref 0.7–4.0)
MCH: 29 pg (ref 26.0–34.0)
MCHC: 33.1 g/dL (ref 30.0–36.0)
MCV: 87.7 fL (ref 80.0–100.0)
Monocytes Absolute: 0.7 10*3/uL (ref 0.1–1.0)
Monocytes Relative: 6 %
Neutro Abs: 7.3 10*3/uL (ref 1.7–7.7)
Neutrophils Relative %: 63 %
Platelets: 436 10*3/uL — ABNORMAL HIGH (ref 150–400)
RBC: 5.13 MIL/uL — ABNORMAL HIGH (ref 3.87–5.11)
RDW: 12.1 % (ref 11.5–15.5)
WBC: 11.6 10*3/uL — ABNORMAL HIGH (ref 4.0–10.5)
nRBC: 0 % (ref 0.0–0.2)

## 2018-06-21 LAB — COMPREHENSIVE METABOLIC PANEL
ALT: 14 U/L (ref 0–44)
AST: 19 U/L (ref 15–41)
Albumin: 3.8 g/dL (ref 3.5–5.0)
Alkaline Phosphatase: 57 U/L (ref 38–126)
Anion gap: 10 (ref 5–15)
BUN: 9 mg/dL (ref 6–20)
CO2: 28 mmol/L (ref 22–32)
Calcium: 9.1 mg/dL (ref 8.9–10.3)
Chloride: 101 mmol/L (ref 98–111)
Creatinine, Ser: 0.81 mg/dL (ref 0.44–1.00)
GFR calc Af Amer: >60 mL/min (ref >60)
GFR calc non Af Amer: >60 mL/min (ref >60)
Glucose, Bld: 113 mg/dL — ABNORMAL HIGH (ref 70–99)
Potassium: 3.6 mmol/L (ref 3.5–5.1)
Sodium: 139 mmol/L (ref 135–145)
Total Bilirubin: 0.6 mg/dL (ref 0.3–1.2)
Total Protein: 7.3 g/dL (ref 6.5–8.1)

## 2018-06-21 LAB — SEDIMENTATION RATE: Sed Rate: 43 mm/hr — ABNORMAL HIGH (ref 0–22)

## 2018-06-21 LAB — PREGNANCY, URINE: Preg Test, Ur: NEGATIVE

## 2018-06-21 LAB — URINALYSIS, ROUTINE W REFLEX MICROSCOPIC
Bilirubin Urine: NEGATIVE
Glucose, UA: NEGATIVE mg/dL
Hgb urine dipstick: NEGATIVE
Ketones, ur: NEGATIVE mg/dL
Leukocytes, UA: NEGATIVE
Nitrite: NEGATIVE
Protein, ur: NEGATIVE mg/dL
Specific Gravity, Urine: 1.015 (ref 1.005–1.030)
pH: 7 (ref 5.0–8.0)

## 2018-06-21 LAB — BRAIN NATRIURETIC PEPTIDE: B Natriuretic Peptide: 16.8 pg/mL (ref 0.0–100.0)

## 2018-06-21 LAB — C-REACTIVE PROTEIN: CRP: 3.2 mg/dL — ABNORMAL HIGH (ref <1.0)

## 2018-06-21 LAB — PROCALCITONIN: Procalcitonin: <0.1 ng/mL

## 2018-06-21 LAB — LACTIC ACID, PLASMA: Lactic Acid, Venous: 1.2 mmol/L (ref 0.5–1.9)

## 2018-06-21 MED ORDER — OXYCODONE HCL 5 MG PO TABS
10.0000 mg | ORAL_TABLET | ORAL | Status: DC | PRN
  Administered 2018-06-21 – 2018-06-22 (×3): 10 mg via ORAL
  Filled 2018-06-21 (×3): qty 2

## 2018-06-21 MED ORDER — ONDANSETRON HCL 4 MG PO TABS: 4.0000 mg | ORAL_TABLET | Freq: Four times a day (QID) | ORAL | Status: DC | PRN

## 2018-06-21 MED ORDER — HYDROMORPHONE HCL 2 MG PO TABS: 1.0000 mg | ORAL_TABLET | Freq: Four times a day (QID) | ORAL | Status: DC | PRN

## 2018-06-21 MED ORDER — ACETAMINOPHEN 500 MG PO TABS
1000.0000 mg | ORAL_TABLET | Freq: Once | ORAL | Status: AC
  Administered 2018-06-21: 1000 mg via ORAL
  Filled 2018-06-21: qty 2

## 2018-06-21 MED ORDER — AMLODIPINE BESYLATE 5 MG PO TABS
5.0000 mg | ORAL_TABLET | Freq: Every day | ORAL | Status: DC
  Administered 2018-06-21 – 2018-06-22 (×2): 5 mg via ORAL
  Filled 2018-06-21 (×2): qty 1

## 2018-06-21 MED ORDER — CYCLOBENZAPRINE HCL 10 MG PO TABS: 10.0000 mg | ORAL_TABLET | Freq: Three times a day (TID) | ORAL | Status: DC | PRN

## 2018-06-21 MED ORDER — RIVAROXABAN 20 MG PO TABS: 20.0000 mg | ORAL_TABLET | Freq: Every day | ORAL | Status: DC

## 2018-06-21 MED ORDER — LEVALBUTEROL HCL 1.25 MG/0.5ML IN NEBU
1.2500 mg | INHALATION_SOLUTION | Freq: Four times a day (QID) | RESPIRATORY_TRACT | Status: DC
  Administered 2018-06-21: 1.25 mg via RESPIRATORY_TRACT
  Filled 2018-06-21: qty 0.5

## 2018-06-21 MED ORDER — DOCUSATE SODIUM 100 MG PO CAPS
100.0000 mg | ORAL_CAPSULE | Freq: Every day | ORAL | Status: DC
  Administered 2018-06-21: 100 mg via ORAL
  Filled 2018-06-21: qty 1

## 2018-06-21 MED ORDER — LEVALBUTEROL HCL 1.25 MG/0.5ML IN NEBU: 1.2500 mg | INHALATION_SOLUTION | Freq: Four times a day (QID) | RESPIRATORY_TRACT | Status: DC | PRN

## 2018-06-21 MED ORDER — CALCIUM CARBONATE ANTACID 500 MG PO CHEW: 1.0000 | CHEWABLE_TABLET | Freq: Two times a day (BID) | ORAL | Status: DC | PRN

## 2018-06-21 MED ORDER — HYDROCHLOROTHIAZIDE 25 MG PO TABS
25.0000 mg | ORAL_TABLET | Freq: Every day | ORAL | Status: DC
  Administered 2018-06-22: 25 mg via ORAL
  Filled 2018-06-21: qty 1

## 2018-06-21 MED ORDER — POLYETHYLENE GLYCOL 3350 17 G PO PACK: 17.0000 g | PACK | Freq: Every day | ORAL | Status: DC | PRN

## 2018-06-21 MED ORDER — FLUTICASONE PROPIONATE 50 MCG/ACT NA SUSP
2.0000 | Freq: Every evening | NASAL | Status: DC
  Filled 2018-06-21: qty 16

## 2018-06-21 MED ORDER — ONDANSETRON HCL 4 MG/2ML IJ SOLN: 4.0000 mg | Freq: Four times a day (QID) | INTRAMUSCULAR | Status: DC | PRN

## 2018-06-21 MED ORDER — RIVAROXABAN 15 MG PO TABS
15.0000 mg | ORAL_TABLET | Freq: Two times a day (BID) | ORAL | Status: DC
  Administered 2018-06-21 – 2018-06-22 (×2): 15 mg via ORAL
  Filled 2018-06-21 (×2): qty 1

## 2018-06-21 MED ORDER — ALPRAZOLAM 0.5 MG PO TABS
0.5000 mg | ORAL_TABLET | Freq: Three times a day (TID) | ORAL | Status: DC | PRN
  Filled 2018-06-21: qty 1

## 2018-06-21 MED ORDER — ADULT MULTIVITAMIN W/MINERALS CH
ORAL_TABLET | Freq: Every day | ORAL | Status: DC
  Filled 2018-06-21: qty 1

## 2018-06-21 MED ORDER — SUMATRIPTAN SUCCINATE 50 MG PO TABS
100.0000 mg | ORAL_TABLET | ORAL | Status: DC | PRN
  Filled 2018-06-21: qty 2

## 2018-06-21 MED ORDER — ACETAMINOPHEN 325 MG PO TABS: 650.0000 mg | ORAL_TABLET | Freq: Four times a day (QID) | ORAL | Status: DC | PRN

## 2018-06-21 MED ORDER — LORATADINE 10 MG PO TABS
10.0000 mg | ORAL_TABLET | Freq: Every day | ORAL | Status: DC
  Administered 2018-06-21: 10 mg via ORAL
  Filled 2018-06-21 (×2): qty 1

## 2018-06-21 MED ORDER — ACETAMINOPHEN 650 MG RE SUPP: 650.0000 mg | Freq: Four times a day (QID) | RECTAL | Status: DC | PRN

## 2018-06-21 MED ORDER — GABAPENTIN 300 MG PO CAPS
300.0000 mg | ORAL_CAPSULE | Freq: Three times a day (TID) | ORAL | Status: DC
  Administered 2018-06-21 – 2018-06-22 (×2): 300 mg via ORAL
  Filled 2018-06-21 (×2): qty 1

## 2018-06-21 MED ORDER — SODIUM CHLORIDE 0.9 % IV BOLUS
1000.0000 mL | Freq: Once | INTRAVENOUS | Status: AC
  Administered 2018-06-21: 1000 mL via INTRAVENOUS

## 2018-06-21 MED ORDER — PANTOPRAZOLE SODIUM 40 MG PO TBEC
40.0000 mg | DELAYED_RELEASE_TABLET | Freq: Every day | ORAL | Status: DC
  Administered 2018-06-22: 40 mg via ORAL
  Filled 2018-06-21: qty 1

## 2018-06-21 NOTE — ED Notes (Signed)
One set of blood cultures collected and sent to lab. Patient is a difficult IV stick so unable to obtain second set. Will put in a consult for IV team for second set of cultures.

## 2018-06-21 NOTE — ED Triage Notes (Signed)
Patient states she was discharged from the hospital 2 days ago. Patient reports that she was recently diagnosed with PE. Patient c/o SOB since yesterday.  Patient states she had elevated BP at home when the home health nurse was with her today. Patient also c/o headache and states she just does not feel well.

## 2018-06-21 NOTE — Telephone Encounter (Signed)
Incoming call from [facility]  [facility]. Who states tha Patient is complain of elvated blood pressures and feet and hands feeling cold.  face is warm trunk of body is warm.  Denies fever.  Temp is 98.2.  206/93 blood pressure last night.  190/100150/100 160/ 100 reading for today.  Tested for negative for the flu 2 days ago.  Hx of Spinal fusion.  Per protocol  Patient should go to ED or Urgent care for evaluation.  Patient does not wish to go.  Reviewed care advice Patient voiced understanding yet still refuses to go to ED or Urgent Care  Wishes to talk with Dr.  Clent Ridges.   Reason for Disposition . [1] Systolic BP  >= 160 OR Diastolic >= 100 AND [2] cardiac or neurologic symptoms (e.g., chest pain, difficulty breathing, unsteady gait, blurred vision)  Answer Assessment - Initial Assessment Questions 1. BLOOD PRESSURE: "What is the blood pressure?" "Did you take at least two measurements 5 minutes apart?"     190/100, 150/100 2. ONSET: "When did you take your blood pressure?"      206/ 93  3. HOW: "How did you obtain the blood pressure?" (e.g., visiting nurse, automatic home BP monitor)     Manual and automatic 4. HISTORY: "Do you have a history of high blood pressure?"     yes 5. MEDICATIONS: "Are you taking any medications for blood pressure?" "Have you missed any doses recently?"     no 6. OTHER SYMPTOMS: "Do you have any symptoms?" (e.g., headache, chest pain, blurred vision, difficulty breathing, weakness)     headache 7. PREGNANCY: "Is there any chance you are pregnant?" "When was your last menstrual period?"     na  Protocols used: HIGH BLOOD PRESSURE-A-AH

## 2018-06-21 NOTE — H&P (Signed)
History and Physical    [name]:[id number] DOB: Oct 06, 1975 DOA: 06/21/2018  Referring MD/NP/PA:   PCP: Laurey Morale, MD   Patient coming from:  The patient is coming from home.  At baseline, pt is independent for most of ADL.        Chief Complaint: Elevated blood pressure, subjective fever, chills,  HPI: [name] is a 43 y.o. female with medical history significant of hypertension, hyperlipidemia, GERD, PE on Xarelto, chronic back pain (s/p of lumbar spine fusion due to spinal stenosis 05/2018), GERD, depression, anxiety, who presents with elevated blood pressure, subjective fever, chills.  Patient was recently hospitalized from 1/17-1/19 due to sepsis/SIRS.  Per discharge summary, pt was tachycardic and febrile, soft blood pressure, elevated lactic acid level and profound leukocytosis. She was treated with empiric antibiotics Vanco and cefepime.  Received a dose of Flagyl in the ER.  Cultures remain negative. No source of infection was identified.  Patient was discharged on no antibiotics.  Patient states that after she went home, she feels like her symptoms comes back, including subjective fever and chills.  Feels generalized uncomfortable, sweating a lot, warm and hot face and myalgia and HA.  Her blood pressure was elevated at 206/100 at home.  She has a chest heaviness, but no active chest pain.  Denies cough or shortness of breath.  No nausea vomiting, diarrhea, abdominal pain, symptoms of UTI or unilateral weakness.  She has chronic back pain, which has not worsened today.  ED Course: pt was found to have WBC 11.6, Bp 206/100 at home-->155/93 in ED, negative urinalysis, electrolytes renal function okay, temperature 98.4, tachycardia with heart rate up to 134, tachypnea with RR 23, oxygen saturation 96% on room air.  Chest x-ray negative.  Patient is placed on telemetry bed for observation.  Review of Systems:   General: Has subjective fevers, chills, diaphoresis, no  body weight gain, has fatigue HEENT: no blurry vision, hearing changes or sore throat Respiratory: no dyspnea, coughing, wheezing CV: Has chest heaviness, no palpitations GI: no nausea, vomiting, abdominal pain, diarrhea, constipation GU: no dysuria, burning on urination, increased urinary frequency, hematuria  Ext: no leg edema Neuro: no unilateral weakness, numbness, or tingling, no vision change or hearing loss Skin: no rash, no skin tear. MSK: No muscle spasm, no deformity, no limitation of range of movement in spin. Has lower back pain. Heme: No easy bruising.  Travel history: No recent long distant travel.  Allergy:  Allergies  Allergen Reactions  . Gadolinium Derivatives Swelling and Other (See Comments)    Arm swelling, tingling, & redness. Radiologist Dr. Pascal Lux recommends an injection of steroids, or 13-hour prep if non-emergent, if patient needs a gadolinium based contrast in the future.  . Other Rash and Other (See Comments)    Glue or tape to seal up wound  . Zoloft [Sertraline Hcl] Other (See Comments)    Hair loss    Past Medical History:  Diagnosis Date  . Allergy   . Anxiety   . Arthritis   . B12 deficiency   . Chronic narcotic use   . Depression    not currently   . Dysrhythmia    hx tachy-was from medication nortriptylline  . Esophagitis    rx  . Fibromyalgia   . GERD (gastroesophageal reflux disease)   . History of echocardiogram    Echo 4/18: Mild concentric LVH, vigorous LVF, EF 65-70, normal wall motion, grade 1 diastolic dysfunction  . History of kidney  stones   . Hyperlipidemia   . Intervertebral disc protrusion 01/11/2014  . Migraine   . Obesity (BMI 35.0-39.9 without comorbidity)   . Peripheral neuropathy   . Polycystic ovaries   . Pulmonary embolus ([place])   . Spinal stenosis, lumbar   . Vitamin D deficiency     Past Surgical History:  Procedure Laterality Date  . [place] STUDY N/A 05/27/2016   Procedure: [place] STUDY;  Surgeon:  Manus Gunning, MD;  Location: [facility];  Service: Gastroenterology;  Laterality: N/A;  . ABDOMINAL EXPOSURE N/A 05/04/2018   Procedure: ABDOMINAL EXPOSURE;  Surgeon: Rosetta Posner, MD;  Location: [place];  Service: Vascular;  Laterality: N/A;  . ANTERIOR LUMBAR FUSION N/A 05/04/2018   Procedure: Lumbar Five Sacral One Anterior lumbar interbody fusion;  Surgeon: Kary Kos, MD;  Location: [place];  Service: Neurosurgery;  Laterality: N/A;  Lumbar Five Sacral One Anterior lumbar interbody fusion  . APPENDECTOMY    . BACK SURGERY  2012   left laminectomy at L3-4 per Dr. Luiz Ochoa   . bladder tack  2012  . CHOLECYSTECTOMY N/A 10/03/2015   Procedure: LAPAROSCOPIC CHOLECYSTECTOMY;  Surgeon: Mickeal Skinner, MD;  Location: [place];  Service: General;  Laterality: N/A;  . ESOPHAGEAL MANOMETRY N/A 05/27/2016   Procedure: ESOPHAGEAL MANOMETRY (EM);  Surgeon: Manus Gunning, MD;  Location: [facility];  Service: Gastroenterology;  Laterality: N/A;  . EXCISION OF SKIN TAG  10/03/2015   Procedure: EXCISION OF SKIN TAG;  Surgeon: Arta Bruce Kinsinger, MD;  Location: [place];  Service: General;;  . LUMBAR [place] SURGERY  01-31-14   per Dr. Saintclair Halsted   . OOPHORECTOMY     left overy  . OVARIAN CYST REMOVAL    . RECTOCELE REPAIR    . SINUSOTOMY  12/14/06  . spinal fusion  2016/10  . TONSILLECTOMY    . TONSILLECTOMY    . VAGINAL HYSTERECTOMY    . WRIST GANGLION EXCISION Left     Social History:  reports that she quit smoking about 21 years ago. Her smoking use included cigarettes. She has never used smokeless tobacco. She reports that she does not drink alcohol or use drugs.  Family History:  Family History  Problem Relation Age of Onset  . Fibromyalgia Mother   . Diabetes Mother   . Thyroid cancer Mother   . Liver disease Mother   . Neuropathy Mother   . Cirrhosis Mother        non-alcoholic  . Pulmonary embolism Mother        both lungs  . Hypertension Father   . Diabetes Father   . Heart  disease Father   . Prostate cancer Father   . Heart attack Father        x 2  . Colon cancer Neg Hx   . Other Neg Hx        pheochromocytoma  . Colon polyps Neg Hx      Prior to Admission medications   Medication Sig Start Date End Date Taking? Authorizing Provider  ALPRAZolam (XANAX) 0.5 MG tablet TAKE 1 TABLET BY MOUTH THREE TIMES A DAY AS NEEDED Patient taking differently: Take 0.5 mg by mouth 3 (three) times daily as needed for anxiety.  11/03/17  Yes Laurey Morale, MD  calcium carbonate (TUMS - DOSED IN MG ELEMENTAL CALCIUM) 500 MG chewable tablet Chew 1 tablet by mouth 2 (two) times daily as needed for indigestion or heartburn.   Yes [provider]  cetirizine (ZYRTEC) 10 MG tablet Take 10 mg by mouth daily.   Yes [provider]  cyanocobalamin (,VITAMIN B-12,) 1000 MCG/ML injection INJECT 1 ML (1,000 MCG TOTAL) INTO THE MUSCLE EVERY 7 DAYS. Patient taking differently: Inject 1,000 mcg into the muscle once a week. INJECT 1 ML (1,000 MCG TOTAL) INTO THE MUSCLE EVERY 7 DAYS. 05/31/17  Yes Laurey Morale, MD  cyclobenzaprine (FLEXERIL) 10 MG tablet Take 10 mg by mouth 3 (three) times daily as needed for muscle spasms.   Yes [provider]  docusate sodium (COLACE) 100 MG capsule Take 100 mg by mouth at bedtime.   Yes [provider]  fluconazole (DIFLUCAN) 100 MG tablet Take 1 tablet (100 mg total) by mouth daily for 3 days. 06/19/18 06/22/18 Yes Georgette Shell, MD  fluticasone [facility]) 50 MCG/ACT nasal spray Place 2 sprays into both nostrils every evening.    Yes [provider]  gabapentin (NEURONTIN) 300 MG capsule Take 1 capsule (300 mg total) by mouth 3 (three) times daily. 04/12/18  Yes Laurey Morale, MD  hydrochlorothiazide (HYDRODIURIL) 25 MG tablet TAKE 1 TABLET BY MOUTH EVERY DAY Patient taking differently: Take 25 mg by mouth daily.  05/06/18  Yes Laurey Morale, MD  HYDROmorphone (DILAUDID) 2 MG tablet Take 0.5 tablets (1 mg  total) by mouth every 6 (six) hours as needed for severe pain. 06/10/18  Yes Laurey Morale, MD  Multiple Vitamins-Minerals (MULTI-VITAMIN GUMMIES PO) Take 2 tablets by mouth daily.    Yes [provider]  nitrofurantoin, macrocrystal-monohydrate, (MACROBID) 100 MG capsule Take 100 mg by mouth daily as needed (for uti prophylaxis, after intercourse).  09/26/16  Yes [provider]  oxyCODONE (ROXICODONE) 15 MG immediate release tablet Take 1 tablet (15 mg total) by mouth every 4 (four) hours as needed for up to 30 days for pain. 08/09/18 09/08/18 Yes Laurey Morale, MD  pantoprazole (PROTONIX) 40 MG tablet Take 1 tablet (40 mg total) by mouth daily. 06/08/18  Yes Hongalgi, Lenis Dickinson, MD  potassium chloride (KLOR-CON 10) 10 MEQ tablet Take 1 tablet (10 mEq total) by mouth daily. Patient taking differently: Take 10 mEq by mouth daily as needed (for potassium levels).  03/26/16  Yes Laurey Morale, MD  promethazine (PHENERGAN) 25 MG tablet TAKE 1 TABLET BY MOUTH EVERY 6 HOURS AS NEEDED FOR NAUSEA Patient taking differently: Take 25 mg by mouth every 6 (six) hours as needed for nausea.  02/19/17  Yes Laurey Morale, MD  Rivaroxaban 15 & 20 MG TBPK Take as directed on package: Start with one '15mg'$  tablet by mouth twice a day with food. On Day 22, switch to one '20mg'$  tablet once a day with food. Pharmacy: Please discard first tablet from the pack which she has received in the hospital. 06/08/18  Yes Hongalgi, Lenis Dickinson, MD  simethicone (MYLICON) 80 MG chewable tablet Chew 160 mg by mouth every 6 (six) hours as needed (gas pain).   Yes [provider]  SUMAtriptan (IMITREX) 100 MG tablet Take 1 tablet earliest onset of migraine.  May repeat once in 2 hours if headache persists or recurs. Patient taking differently: Take 100 mg by mouth every 2 (two) hours as needed for migraine or headache. Take 1 tablet earliest onset of migraine.  May repeat once in 2 hours if headache persists or recurs. 10/14/16   Yes Jaffe, Adam R, DO  SYRINGE-NEEDLE, DISP, 3 ML (B-D 3CC LUER-LOK SYR 25GX1") 25G  X 1" 3 ML MISC USE AS DIRECTED 04/15/18  Yes Laurey Morale, MD  Vitamin D, Ergocalciferol, (DRISDOL) 50000 units CAPS capsule TAKE 1 CAPSULE (50,000 UNITS TOTAL) BY MOUTH EVERY 7 (SEVEN) DAYS. 05/31/17  Yes Laurey Morale, MD    Physical Exam: Vitals:   06/21/18 1815 06/21/18 1930 06/21/18 2002 06/21/18 2102  BP:  (!) 142/105 (!) 155/93 (!) 155/93  Pulse: (!) 122 (!) 118 (!) 111 (!) 104  Resp: '17 16 19 13  '$ Temp:      TempSrc:      SpO2: 96% 96% 96% 95%  Weight:      Height:       General: Not in acute distress HEENT:       Eyes: PERRL, EOMI, no scleral icterus.       ENT: No discharge from the ears and nose, no pharynx injection, no tonsillar enlargement.        Neck: No JVD, no bruit, no mass felt. Heme: No neck lymph node enlargement. Cardiac: S1/S2, RRR, tachycardia, no murmurs, No gallops or rubs. Respiratory: No rales, wheezing, rhonchi or rubs. GI: Soft, nondistended, nontender, no rebound pain, no organomegaly, BS present. GU: No hematuria Ext: No pitting leg edema bilaterally. 2+DP/PT pulse bilaterally. Musculoskeletal: No joint deformities, No joint redness or warmth, no limitation of ROM in spin. Skin: No rashes.  Neuro: Alert, oriented X3, cranial nerves II-XII grossly intact, moves all extremities normally. Psych: Patient is not psychotic, no suicidal or hemocidal ideation.  Labs on Admission: I have personally reviewed following labs and imaging studies  CBC: Recent Labs  Lab 06/17/18 0047 06/18/18 0515 06/19/18 0546 06/21/18 1902  WBC 25.1* 9.4 7.2 11.6*  NEUTROABS  --   --   --  7.3  HGB 15.1* 12.5 11.2* 14.9  HCT 45.4 39.5 35.0* 45.0  MCV 86.1 90.6 91.9 87.7  PLT 444* 336 260 257*   Basic Metabolic Panel: Recent Labs  Lab 06/17/18 0047 06/18/18 0515 06/19/18 0546 06/21/18 1902  NA 134* 139 142 139  K 3.9 3.2* 3.4* 3.6  CL 98 101 109 101  CO2 '25 27 25 28    '$ GLUCOSE 176* 141* 141* 113*  BUN '15 13 10 9  '$ CREATININE 1.19* 1.01* 0.84 0.81  CALCIUM 9.3 8.4* 8.1* 9.1   GFR: Estimated Creatinine Clearance: 127.5 mL/min (by C-G formula based on SCr of 0.81 mg/dL). Liver Function Tests: Recent Labs  Lab 06/18/18 0515 06/19/18 0546 06/21/18 1902  AST 17 13* 19  ALT '18 13 14  '$ ALKPHOS 47 46 57  BILITOT 0.5 0.2* 0.6  PROT 6.5 5.8* 7.3  ALBUMIN 3.5 2.9* 3.8   No results for input(s): LIPASE, AMYLASE in the last 168 hours. No results for input(s): AMMONIA in the last 168 hours. Coagulation Profile: No results for input(s): INR, PROTIME in the last 168 hours. Cardiac Enzymes: Recent Labs  Lab 06/17/18 0047  TROPONINI <0.03   BNP (last 3 results) No results for input(s): PROBNP in the last 8760 hours. HbA1C: No results for input(s): HGBA1C in the last 72 hours. CBG: No results for input(s): GLUCAP in the last 168 hours. Lipid Profile: No results for input(s): CHOL, HDL, LDLCALC, TRIG, CHOLHDL, LDLDIRECT in the last 72 hours. Thyroid Function Tests: No results for input(s): TSH, T4TOTAL, FREET4, T3FREE, THYROIDAB in the last 72 hours. Anemia Panel: No results for input(s): VITAMINB12, FOLATE, FERRITIN, TIBC, IRON, RETICCTPCT in the last 72 hours. Urine analysis:    Component Value Date/Time   COLORURINE  YELLOW 06/21/2018 1915   APPEARANCEUR CLEAR 06/21/2018 1915   LABSPEC 1.015 06/21/2018 1915   PHURINE 7.0 06/21/2018 1915   GLUCOSEU NEGATIVE 06/21/2018 [place] NEGATIVE 06/21/2018 1915   HGBUR negative 04/04/2007 1513   BILIRUBINUR NEGATIVE 06/21/2018 1915   BILIRUBINUR negative 12/24/2017 [place] 06/21/2018 1915   PROTEINUR NEGATIVE 06/21/2018 1915   UROBILINOGEN 0.2 12/24/2017 1316   UROBILINOGEN 0.2 04/08/2015 1242   NITRITE NEGATIVE 06/21/2018 1915   LEUKOCYTESUR NEGATIVE 06/21/2018 1915   Sepsis Labs: '@LABRCNTIP'$ (procalcitonin:4,lacticidven:4) ) Recent Results (from the past 240 hour(s))  Blood  Culture (routine x 2)     Status: None (Preliminary result)   Collection Time: 06/17/18  2:04 AM  Result Value Ref Range Status   Specimen Description   Final    BLOOD RIGHT ANTECUBITAL Performed at [facility], [address] [place], [place] [address]    Special Requests   Final    BOTTLES DRAWN AEROBIC AND ANAEROBIC Blood Culture adequate volume Performed at [facility], [address] [place], [place] [address]    Culture   Final    NO GROWTH 4 DAYS Performed at [facility], [place] [address], [place], [place] [address]    Report Status PENDING  Incomplete  Blood Culture (routine x 2)     Status: None (Preliminary result)   Collection Time: 06/17/18  2:09 AM  Result Value Ref Range Status   Specimen Description   Final    BLOOD LEFT FOREARM Performed at [facility], [address] [place], [place] [address]    Special Requests   Final    BOTTLES DRAWN AEROBIC AND ANAEROBIC Blood Culture adequate volume Performed at [facility], [address] [place], [place] [address]    Culture   Final    NO GROWTH 4 DAYS Performed at [facility], [place] [address], [place],  [address]    Report Status PENDING  Incomplete     Radiological Exams on Admission: Dg Chest 2 View  Result Date: 06/21/2018 CLINICAL DATA:  Dyspnea, recently diagnosed with pulmonary embolus EXAM: CHEST - 2 VIEW COMPARISON:  06/17/2018 CT FINDINGS: The heart size and mediastinal contours are within normal limits. Both lungs are clear. The visualized skeletal structures are unremarkable. IMPRESSION: No active cardiopulmonary disease. Electronically Signed   By: Ashley Royalty M.D.   On: 06/21/2018 18:17     EKG: Independently reviewed.  Sinus rhythm, QTC 456, LAE, early R wave progression.    Assessment/Plan Principal Problem:   Hypertensive urgency Active Problems:   ANXIETY DISORDER, GENERALIZED   GERD (gastroesophageal reflux disease)    HTN (hypertension)   Back pain   Pulmonary embolism (H[facility])   Migraine   Leukocytosis   Hypertensive urgency and HTN:   Bp 206/100 at home-->155/93 in ED. Has some chest heaviness, but no active chest pain, which is likely due to known PE.  Patient is taking HCTZ, 25 mg daily at home.  -Placed on telemetry bed for observation - Continue HCTZ -Add amlodipine 5 mg daily  Leukocytosis: WBC 11.6, which was 7.2 on 06/19/2018.  Patient reports subjective fever, chills, diaphoresis.  She also has tachycardia with heart rates up to 130s, initially tachypnea with RR 23.  Not sure if patient has any underlying infection.  Patient was recently hospitalized and suspected to have SIRS versus sepsis, but no source of infection identified.  Urinalysis negative.  Chest x-ray negative. -Will repeat blood culture -Hold antibiotics -Check CRP, ESR  AXIETY DISORDER, GENERALIZED: -Continue home medications: PRN Xanax  GERD: -Protonix  Back pain: -Continue PRN Dilaudid, Flexeril, Neurontin  Pulmonary embolism ([place]): Reporting chest heaviness -Continue Xarelto -Check BNP  Migraine: -prn sumatriptan    DVT ppx: on Xarelto Code Status: Full code Family Communication:   Yes, patient's husband at bed side Disposition Plan:  Anticipate discharge back to previous home environment Consults called:  none Admission status: Obs / tele   Date of Service 06/21/2018    [facility]   If 7PM-7AM, please contact night-coverage [email or web address] Password [facility] 06/21/2018, 10:24 PM

## 2018-06-21 NOTE — Telephone Encounter (Signed)
Dr. Fry please advise. Thanks  

## 2018-06-21 NOTE — ED Provider Notes (Signed)
[facility]-EMERGENCY DEPT Provider Note   CSN: [id number] Arrival date & time: 06/21/18  1708     History   Chief Complaint Chief Complaint  Patient presents with  . Shortness of Breath  . Hypertension    HPI [name] is a 43 y.o. female.  43 year old female with prior medical history as detailed below presents for evaluation of myalgias, fatigue, subjective fever, and malaise.  Patient reports that she was discharged recently from this facility on the 19th.  She was recently admitted for treatment of fever with elevated white count.  Broad-spectrum antibiotics were given to her over the course of her admission.  Patient was improved at time of discharge.  It appears that the working diagnosis is that she had a possible infection.  She was not discharged home with antibiotics.  Patient reports that over the last 24 hours her symptoms have returned.  She is beginning to feel the same as she did when she was first admitted on the 17th.  Patient denies current chest pain, nausea, vomiting, or other more acute complaint.  The history is provided by the patient and medical records.  Illness  Location:  Myalgias, fatigue, subjective fever, and malaise Severity:  Moderate Onset quality:  Gradual Duration:  1 day Timing:  Constant Progression:  Worsening Chronicity:  Recurrent   Past Medical History:  Diagnosis Date  . Allergy   . Anxiety   . Arthritis   . B12 deficiency   . Chronic narcotic use   . Depression    not currently   . Dysrhythmia    hx tachy-was from medication nortriptylline  . Esophagitis    rx  . Fibromyalgia   . GERD (gastroesophageal reflux disease)   . History of echocardiogram    Echo 4/18: Mild concentric LVH, vigorous LVF, EF 65-70, normal wall motion, grade 1 diastolic dysfunction  . History of kidney stones   . Hyperlipidemia   . Intervertebral disc protrusion 01/11/2014  . Migraine   . Obesity (BMI 35.0-39.9  without comorbidity)   . Peripheral neuropathy   . Polycystic ovaries   . Pulmonary embolus (HCC)   . Spinal stenosis, lumbar   . Vitamin D deficiency     Patient Active Problem List   Diagnosis Date Noted  . Sepsis (HCC) 06/21/2018  . Leukocytosis 06/21/2018  . Hypertensive urgency 06/21/2018  . SIRS (systemic inflammatory response syndrome) (HCC) 06/17/2018  . Fever   . Precordial pain   . Elevated lactic acid level   . Bandemia   . Migraine 06/07/2018  . Pulmonary embolism (HCC) 06/06/2018  . DDD (degenerative disc disease), lumbar 05/04/2018  . Atypical chest pain   . Spinal stenosis of lumbar region 04/01/2015  . Chronic migraine without aura without status migrainosus, not intractable 03/04/2015  . Elevated plasma metanephrines 11/16/2014  . Alopecia 05/21/2014  . Tachycardia 04/04/2014  . Sciatica 01/11/2014  . Back pain 01/11/2014  . Intervertebral disc protrusion 01/11/2014  . HTN (hypertension) 01/04/2013  . Hemorrhoids 04/25/2012  . Urinary retention 12/11/2010  . CERUMEN IMPACTION 01/28/2010  . Depression 04/08/2009  . Myalgia 10/11/2008  . Morbid obesity (HCC) 08/10/2008  . ACNE VULGARIS 08/10/2008  . INSOMNIA 06/20/2008  . VITAMIN B12 DEFICIENCY 03/23/2008  . PERIPHERAL NEUROPATHY 02/27/2008  . CONSTIPATION 08/22/2007  . ANXIETY DISORDER, GENERALIZED 01/04/2007  . GERD (gastroesophageal reflux disease) 01/04/2007  . DEGENERATIVE JOINT DISEASE, KNEES, BILATERAL 01/04/2007  . RENAL CALCULUS, HX OF 01/04/2007    Past Surgical History:  Procedure Laterality Date  . 24 HOUR PH STUDY N/A 05/27/2016   Procedure: 24 HOUR PH STUDY;  Surgeon: Ruffin FrederickSteven Paul Armbruster, MD;  Location: [facility];  Service: Gastroenterology;  Laterality: N/A;  . ABDOMINAL EXPOSURE N/A 05/04/2018   Procedure: ABDOMINAL EXPOSURE;  Surgeon: Larina EarthlyEarly, Todd F, MD;  [facility];  Service: Vascular;  Laterality: N/A;  . ANTERIOR LUMBAR FUSION N/A 05/04/2018   Procedure: Lumbar Five  Sacral One Anterior lumbar interbody fusion;  Surgeon: Donalee Citrinram, Gary, MD;  [facility];  Service: Neurosurgery;  Laterality: N/A;  Lumbar Five Sacral One Anterior lumbar interbody fusion  . APPENDECTOMY    . BACK SURGERY  2012   left laminectomy at L3-4 per Dr. Phoebe PerchHirsch   . bladder tack  2012  . CHOLECYSTECTOMY N/A 10/03/2015   Procedure: LAPAROSCOPIC CHOLECYSTECTOMY;  Surgeon: Rodman PickleLuke Aaron Kinsinger, MD;  [facility];  Service: General;  Laterality: N/A;  . ESOPHAGEAL MANOMETRY N/A 05/27/2016   Procedure: ESOPHAGEAL MANOMETRY (EM);  Surgeon: Ruffin FrederickSteven Paul Armbruster, MD;  Location: [facility];  Service: Gastroenterology;  Laterality: N/A;  . EXCISION OF SKIN TAG  10/03/2015   Procedure: EXCISION OF SKIN TAG;  Surgeon: De BlanchLuke Aaron Kinsinger, MD;  Location: [facility] OR;  Service: General;;  . LUMBAR DISC SURGERY  01-31-14   per Dr. Wynetta Emeryram   . OOPHORECTOMY     left overy  . OVARIAN CYST REMOVAL    . RECTOCELE REPAIR    . SINUSOTOMY  12/14/06  . spinal fusion  2016/10  . TONSILLECTOMY    . TONSILLECTOMY    . VAGINAL HYSTERECTOMY    . WRIST GANGLION EXCISION Left      OB History    Gravida  2   Para  2   Term      Preterm  2   AB  0   Living  2     SAB      TAB      Ectopic      Multiple      Live Births  2            Home Medications    Prior to Admission medications   Medication Sig Start Date End Date Taking? Authorizing Provider  ALPRAZolam (XANAX) 0.5 MG tablet TAKE 1 TABLET BY MOUTH THREE TIMES A DAY AS NEEDED Patient taking differently: Take 0.5 mg by mouth 3 (three) times daily as needed for anxiety.  11/03/17  Yes Nelwyn SalisburyFry, [name], MD  calcium carbonate (TUMS - DOSED IN MG ELEMENTAL CALCIUM) 500 MG chewable tablet Chew 1 tablet by mouth 2 (two) times daily as needed for indigestion or heartburn.   Yes [provider]  cetirizine (ZYRTEC) 10 MG tablet Take 10 mg by mouth daily.   Yes [provider]  cyanocobalamin (,VITAMIN B-12,) 1000 MCG/ML  injection INJECT 1 ML (1,000 MCG TOTAL) INTO THE MUSCLE EVERY 7 DAYS. Patient taking differently: Inject 1,000 mcg into the muscle once a week. INJECT 1 ML (1,000 MCG TOTAL) INTO THE MUSCLE EVERY 7 DAYS. 05/31/17  Yes Nelwyn SalisburyFry, [name], MD  cyclobenzaprine (FLEXERIL) 10 MG tablet Take 10 mg by mouth 3 (three) times daily as needed for muscle spasms.   Yes [provider]  docusate sodium (COLACE) 100 MG capsule Take 100 mg by mouth at bedtime.   Yes [provider]  fluconazole (DIFLUCAN) 100 MG tablet Take 1 tablet (100 mg total) by mouth daily for 3 days. 06/19/18 06/22/18 Yes Alwyn RenMathews, Elizabeth G, MD  fluticasone (FLONASE) 50 MCG/ACT nasal spray Place 2 sprays into both nostrils every evening.    Yes [provider]  gabapentin (NEURONTIN) 300 MG capsule Take 1 capsule (300 mg total) by mouth 3 (three) times daily. 04/12/18  Yes [name], MD  hydrochlorothiazide (HYDRODIURIL) 25 MG tablet TAKE 1 TABLET BY MOUTH EVERY DAY Patient taking differently: Take 25 mg by mouth daily.  05/06/18  Yes [name], MD  HYDROmorphone (DILAUDID) 2 MG tablet Take 0.5 tablets (1 mg total) by mouth every 6 (six) hours as needed for severe pain. 06/10/18  Yes [name], MD  Multiple Vitamins-Minerals (MULTI-VITAMIN GUMMIES PO) Take 2 tablets by mouth daily.    Yes [provider]  nitrofurantoin, macrocrystal-monohydrate, (MACROBID) 100 MG capsule Take 100 mg by mouth daily as needed (for uti prophylaxis, after intercourse).  09/26/16  Yes [provider]  oxyCODONE (ROXICODONE) 15 MG immediate release tablet Take 1 tablet (15 mg total) by mouth every 4 (four) hours as needed for up to 30 days for pain. 08/09/18 09/08/18 Yes [name], MD  pantoprazole (PROTONIX) 40 MG tablet Take 1 tablet (40 mg total) by mouth daily. 06/08/18  Yes Hongalgi, Maximino Greenland, MD  potassium chloride (KLOR-CON 10) 10 MEQ tablet Take 1 tablet (10 mEq total) by mouth daily. Patient taking  differently: Take 10 mEq by mouth daily as needed (for potassium levels).  03/26/16  Yes [name], MD  promethazine (PHENERGAN) 25 MG tablet TAKE 1 TABLET BY MOUTH EVERY 6 HOURS AS NEEDED FOR NAUSEA Patient taking differently: Take 25 mg by mouth every 6 (six) hours as needed for nausea.  02/19/17  Yes [name], MD  Rivaroxaban 15 & 20 MG TBPK Take as directed on package: Start with one 15mg  tablet by mouth twice a day with food. On Day 22, switch to one 20mg  tablet once a day with food. Pharmacy: Please discard first tablet from the pack which she has received in the hospital. 06/08/18  Yes Hongalgi, Maximino Greenland, MD  simethicone (MYLICON) 80 MG chewable tablet Chew 160 mg by mouth every 6 (six) hours as needed (gas pain).   Yes [provider]  SUMAtriptan (IMITREX) 100 MG tablet Take 1 tablet earliest onset of migraine.  May repeat once in 2 hours if headache persists or recurs. Patient taking differently: Take 100 mg by mouth every 2 (two) hours as needed for migraine or headache. Take 1 tablet earliest onset of migraine.  May repeat once in 2 hours if headache persists or recurs. 10/14/16  Yes Jaffe, Adam R, DO  SYRINGE-NEEDLE, DISP, 3 ML (B-D 3CC LUER-LOK SYR 25GX1") 25G X 1" 3 ML MISC USE AS DIRECTED 04/15/18  Yes [name], MD  Vitamin D, Ergocalciferol, (DRISDOL) 50000 units CAPS capsule TAKE 1 CAPSULE (50,000 UNITS TOTAL) BY MOUTH EVERY 7 (SEVEN) DAYS. 05/31/17  Yes [name], MD    Family History Family History  Problem Relation Age of Onset  . Fibromyalgia Mother   . Diabetes Mother   . Thyroid cancer Mother   . Liver disease Mother   . Neuropathy Mother   . Cirrhosis Mother        non-alcoholic  . Pulmonary embolism Mother        both lungs  . Hypertension Father   . Diabetes Father   . Heart disease Father   . Prostate cancer Father   . Heart attack Father  x 2  . Colon cancer Neg Hx   . Other Neg Hx        pheochromocytoma  . Colon polyps Neg  Hx     Social History Social History   Tobacco Use  . Smoking status: Former Smoker    Types: Cigarettes    Last attempt to quit: 1999    Years since quitting: 21.0  . Smokeless tobacco: Never Used  Substance Use Topics  . Alcohol use: No    Alcohol/week: 0.0 standard drinks  . Drug use: No     Allergies   Gadolinium derivatives; Other; and Zoloft [sertraline hcl]   Review of Systems Review of Systems  All other systems reviewed and are negative.    Physical Exam Updated Vital Signs BP (!) 155/93   Pulse (!) 104   Temp 98.4 F (36.9 C) (Oral)   Resp 13   Ht 5\' 9"  (1.753 m)   Wt 126.1 kg   LMP 12/13/1999 Comment: single oophorectomy//a.c.  SpO2 95%   BMI 41.05 kg/m   Physical Exam Vitals signs and nursing note reviewed.  Constitutional:      General: She is not in acute distress.    Appearance: She is well-developed.  HENT:     Head: Normocephalic and atraumatic.  Eyes:     Conjunctiva/sclera: Conjunctivae normal.     Pupils: Pupils are equal, round, and reactive to light.  Neck:     Musculoskeletal: Normal range of motion and neck supple.  Cardiovascular:     Rate and Rhythm: Regular rhythm. Tachycardia present.     Heart sounds: Normal heart sounds.  Pulmonary:     Effort: Pulmonary effort is normal. No respiratory distress.     Breath sounds: Normal breath sounds.  Abdominal:     General: There is no distension.     Palpations: Abdomen is soft.     Tenderness: There is no abdominal tenderness.  Musculoskeletal: Normal range of motion.        General: No deformity.  Skin:    General: Skin is warm and dry.  Neurological:     Mental Status: She is alert and oriented to person, place, and time.      ED Treatments / Results  Labs (all labs ordered are listed, but only abnormal results are displayed) Labs Reviewed  COMPREHENSIVE METABOLIC PANEL - Abnormal; Notable for the following components:      Result Value   Glucose, Bld 113 (*)     All other components within normal limits  CBC WITH DIFFERENTIAL/PLATELET - Abnormal; Notable for the following components:   WBC 11.6 (*)    RBC 5.13 (*)    Platelets 436 (*)    All other components within normal limits  CULTURE, BLOOD (ROUTINE X 2)  CULTURE, BLOOD (ROUTINE X 2)  URINALYSIS, ROUTINE W REFLEX MICROSCOPIC  SEDIMENTATION RATE  C-REACTIVE PROTEIN  LACTIC ACID, PLASMA  PROCALCITONIN  BRAIN NATRIURETIC PEPTIDE  PREGNANCY, URINE  BASIC METABOLIC PANEL  CBC    EKG EKG Interpretation  Date/Time:  Tuesday June 21 2018 17:39:17 EST Ventricular Rate:  133 PR Interval:    QRS Duration: 79 QT Interval:  306 QTC Calculation: 456 R Axis:   83 Text Interpretation:  Sinus tachycardia Confirmed by Kristine Royal [phone]) on 06/21/2018 6:18:30 PM   Radiology Dg Chest 2 View  Result Date: 06/21/2018 CLINICAL DATA:  Dyspnea, recently diagnosed with pulmonary embolus EXAM: CHEST - 2 VIEW COMPARISON:  06/17/2018 CT FINDINGS: The heart size and  mediastinal contours are within normal limits. Both lungs are clear. The visualized skeletal structures are unremarkable. IMPRESSION: No active cardiopulmonary disease. Electronically Signed   By: Tollie Eth M.D.   On: 06/21/2018 18:17    Procedures Procedures (including critical care time)  Medications Ordered in ED Medications  HYDROmorphone (DILAUDID) tablet 1 mg (has no administration in time range)  oxyCODONE (Oxy IR/ROXICODONE) immediate release tablet 10 mg (has no administration in time range)  SUMAtriptan (IMITREX) tablet 100 mg (has no administration in time range)  hydrochlorothiazide (HYDRODIURIL) tablet 25 mg (has no administration in time range)  ALPRAZolam (XANAX) tablet 0.5 mg (has no administration in time range)  calcium carbonate (TUMS - dosed in mg elemental calcium) chewable tablet 200 mg of elemental calcium (has no administration in time range)  docusate sodium (COLACE) capsule 100 mg (has no administration in  time range)  pantoprazole (PROTONIX) EC tablet 40 mg (has no administration in time range)  Rivaroxaban TBPK 15 mg (has no administration in time range)  cyclobenzaprine (FLEXERIL) tablet 10 mg (has no administration in time range)  gabapentin (NEURONTIN) capsule 300 mg (has no administration in time range)  MULTI-VITAMIN GUMMIES CHEW (has no administration in time range)  loratadine (CLARITIN) tablet 10 mg (has no administration in time range)  fluticasone (FLONASE) 50 MCG/ACT nasal spray 2 spray (has no administration in time range)  levalbuterol (XOPENEX) nebulizer solution 1.25 mg (has no administration in time range)  acetaminophen (TYLENOL) tablet 650 mg (has no administration in time range)    Or  acetaminophen (TYLENOL) suppository 650 mg (has no administration in time range)  ondansetron (ZOFRAN) tablet 4 mg (has no administration in time range)    Or  ondansetron (ZOFRAN) injection 4 mg (has no administration in time range)  polyethylene glycol (MIRALAX / GLYCOLAX) packet 17 g (has no administration in time range)  sodium chloride 0.9 % bolus 1,000 mL (0 mLs Intravenous Stopped 06/21/18 2031)  acetaminophen (TYLENOL) tablet 1,000 mg (1,000 mg Oral Given 06/21/18 1914)     Initial Impression / Assessment and Plan / ED Course  I have reviewed the triage vital signs and the nursing notes.  Pertinent labs & imaging results that were available during my care of the patient were reviewed by me and considered in my medical decision making (see chart for details).     MDM  Screen complete  Patient is presenting for evaluation of myalgias, subjective fever, and fatigue.  Patient was recently admitted for elevated white count with associated fever.  Symptoms at that time improved after patient was given broad-spectrum antibiotics.  She is not currently on antibiotics.  A source for her fever and white count was not identified last admission.  Patient is returning now with recurrent  symptoms.  Initial work-up is on the whole without significant abnormality.  Patient does have a tachycardia present on initial exam.  This is improved with IV fluids.  Patient's white count today is mildly elevated from most recent testing.  Patient would likely benefit from overnight observation.  Hospitalist Clyde Lundborg) service is made aware of case will evaluate for admission.  Final Clinical Impressions(s) / ED Diagnoses   Final diagnoses:  Myalgia  Tachycardia    ED Discharge Orders    None       Wynetta Fines, MD 06/21/18 2146

## 2018-06-21 NOTE — ED Notes (Signed)
ED TO INPATIENT HANDOFF REPORT  Name/Age/Gender [name] 43 y.o. female  Code Status    Code Status Orders  (From admission, onward)         Start     Ordered   06/21/18 2137  Full code  Continuous     06/21/18 2137        Code Status History    Date Active Date Inactive Code Status Order ID Comments User Context   06/17/2018 1631 06/19/2018 1529 Full Code [id number]  Alwyn RenMathews, Elizabeth G, MD Inpatient   06/07/2018 0244 06/08/2018 2237 Full Code [id number]  Lorretta HarpNiu, Xilin, MD Inpatient   05/04/2018 1753 05/10/2018 1804 Full Code [id number]  Donalee Citrinram, Gary, MD Inpatient   04/01/2015 1735 04/10/2015 2306 Full Code [id number]  Donalee Citrinram, Gary, MD Inpatient   01/11/2014 0233 01/13/2014 1855 Full Code [id number]  Dorothea OgleMyers, Iskra M, MD Inpatient      Home/SNF/Other Home  Chief Complaint Shortness of Breath; Hypertension  Level of Care/Admitting Diagnosis ED Disposition    ED Disposition Condition Comment   Admit  [facility] [[id number]]  Level of Care: Telemetry [5]  Admit to tele based on following criteria: Other see comments  Comments: hypertensive urgency  Diagnosis: Hypertensive urgency [244010][[id number]]  Admitting Physician: Lorretta HarpNIU, XILIN [[id number]]  Attending Physician: Lorretta HarpNIU, XILIN [[id number]]  PT Class (Do Not Modify): Observation [104]  PT Acc Code (Do Not Modify): Observation [10022]       Medical History Past Medical History:  Diagnosis Date  . Allergy   . Anxiety   . Arthritis   . B12 deficiency   . Chronic narcotic use   . Depression    not currently   . Dysrhythmia    hx tachy-was from medication nortriptylline  . Esophagitis    rx  . Fibromyalgia   . GERD (gastroesophageal reflux disease)   . History of echocardiogram    Echo 4/18: Mild concentric LVH, vigorous LVF, EF 65-70, normal wall motion, grade 1 diastolic dysfunction  . History of kidney stones   . Hyperlipidemia   . Intervertebral disc protrusion 01/11/2014  . Migraine   . Obesity (BMI  35.0-39.9 without comorbidity)   . Peripheral neuropathy   . Polycystic ovaries   . Pulmonary embolus (HCC)   . Spinal stenosis, lumbar   . Vitamin D deficiency     Allergies Allergies  Allergen Reactions  . Gadolinium Derivatives Swelling and Other (See Comments)    Arm swelling, tingling, & redness. Radiologist Dr. Grace IsaacWatts recommends an injection of steroids, or 13-hour prep if non-emergent, if patient needs a gadolinium based contrast in the future.  . Other Rash and Other (See Comments)    Glue or tape to seal up wound  . Zoloft [Sertraline Hcl] Other (See Comments)    Hair loss    IV Location/Drains/Wounds Patient Lines/Drains/Airways Status   Active Line/Drains/Airways    Name:   Placement date:   Placement time:   Site:   Days:   Peripheral IV 06/21/18 Right Antecubital   06/21/18    1928    Antecubital   less than 1   Incision (Closed) 05/04/18 Abdomen   05/04/18    1227     48   Incision (Closed) 05/04/18 Back Other (Comment)   05/04/18    1258     48          Labs/Imaging Results for orders placed or performed during the hospital encounter of 06/21/18 (from the past 48 hour(s))  Comprehensive metabolic panel     Status: Abnormal   Collection Time: 06/21/18  7:02 PM  Result Value Ref Range   Sodium 139 135 - 145 mmol/L   Potassium 3.6 3.5 - 5.1 mmol/L   Chloride 101 98 - 111 mmol/L   CO2 28 22 - 32 mmol/L   Glucose, Bld 113 (H) 70 - 99 mg/dL   BUN 9 6 - 20 mg/dL   Creatinine, Ser 1.300.81 0.44 - 1.00 mg/dL   Calcium 9.1 8.9 - 86.510.3 mg/dL   Total Protein 7.3 6.5 - 8.1 g/dL   Albumin 3.8 3.5 - 5.0 g/dL   AST 19 15 - 41 U/L   ALT 14 0 - 44 U/L   Alkaline Phosphatase 57 38 - 126 U/L   Total Bilirubin 0.6 0.3 - 1.2 mg/dL   GFR calc non Af Amer >60 >60 mL/min   GFR calc Af Amer >60 >60 mL/min   Anion gap 10 5 - 15    Comment: Performed at [facility], [address] [place], [place] [address]  CBC with Differential     Status: Abnormal    Collection Time: 06/21/18  7:02 PM  Result Value Ref Range   WBC 11.6 (H) 4.0 - 10.5 K/uL   RBC 5.13 (H) 3.87 - 5.11 MIL/uL   Hemoglobin 14.9 12.0 - 15.0 g/dL   HCT 62.945.0 52.836.0 - 41.346.0 %   MCV 87.7 80.0 - 100.0 fL   MCH 29.0 26.0 - 34.0 pg   MCHC 33.1 30.0 - 36.0 g/dL   RDW 24.412.1 01.011.5 - 27.215.5 %   Platelets 436 (H) 150 - 400 K/uL   nRBC 0.0 0.0 - 0.2 %   Neutrophils Relative % 63 %   Neutro Abs 7.3 1.7 - 7.7 K/uL   Lymphocytes Relative 28 %   Lymphs Abs 3.2 0.7 - 4.0 K/uL   Monocytes Relative 6 %   Monocytes Absolute 0.7 0.1 - 1.0 K/uL   Eosinophils Relative 2 %   Eosinophils Absolute 0.3 0.0 - 0.5 K/uL   Basophils Relative 1 %   Basophils Absolute 0.1 0.0 - 0.1 K/uL   Immature Granulocytes 0 %   Abs Immature Granulocytes 0.05 0.00 - 0.07 K/uL    Comment: Performed at [facility], [address] [place], [place] [address]  Urinalysis, Routine w reflex microscopic     Status: None   Collection Time: 06/21/18  7:15 PM  Result Value Ref Range   Color, Urine YELLOW YELLOW   APPearance CLEAR CLEAR   Specific Gravity, Urine 1.015 1.005 - 1.030   pH 7.0 5.0 - 8.0   Glucose, UA NEGATIVE NEGATIVE mg/dL   Hgb urine dipstick NEGATIVE NEGATIVE   Bilirubin Urine NEGATIVE NEGATIVE   Ketones, ur NEGATIVE NEGATIVE mg/dL   Protein, ur NEGATIVE NEGATIVE mg/dL   Nitrite NEGATIVE NEGATIVE   Leukocytes, UA NEGATIVE NEGATIVE    Comment: Performed at [facility], [address] [place], [place] [address]  Pregnancy, urine     Status: None   Collection Time: 06/21/18  7:15 PM  Result Value Ref Range   Preg Test, Ur NEGATIVE NEGATIVE    Comment:        THE SENSITIVITY OF THIS METHODOLOGY IS >20 mIU/mL. Performed at [facility], [address] [place], [place] [address]    Dg Chest 2 View  Result Date: 06/21/2018 CLINICAL DATA:  Dyspnea, recently diagnosed with pulmonary embolus EXAM: CHEST - 2 VIEW  COMPARISON:  06/17/2018 CT  FINDINGS: The heart size and mediastinal contours are within normal limits. Both lungs are clear. The visualized skeletal structures are unremarkable. IMPRESSION: No active cardiopulmonary disease. Electronically Signed   By: Tollie Eth M.D.   On: 06/21/2018 18:17    Pending Labs Unresulted Labs (From admission, onward)    Start     Ordered   06/22/18 0500  Basic metabolic panel  Tomorrow morning,   R     06/21/18 2137   06/22/18 0500  CBC  Tomorrow morning,   R     06/21/18 2137   06/21/18 2135  Lactic acid, plasma  Once,   STAT     06/21/18 2134   06/21/18 2135  Brain natriuretic peptide  Once,   R     06/21/18 2135   06/21/18 2134  Culture, blood (Routine X 2) w Reflex to ID Panel  BLOOD CULTURE X 2,   R    Comments:  Please obtain prior to antibiotic administration.    06/21/18 2134   06/21/18 2134  C-reactive protein  Once,   R     06/21/18 2134   06/21/18 2134  Procalcitonin  ONCE - STAT,   R     06/21/18 2134          Vitals/Pain Today's Vitals   06/21/18 1930 06/21/18 2002 06/21/18 2102 06/21/18 2200  BP: (!) 142/105 (!) 155/93 (!) 155/93   Pulse: (!) 118 (!) 111 (!) 104   Resp: 16 19 13    Temp:      TempSrc:      SpO2: 96% 96% 95%   Weight:      Height:      PainSc:    7     Isolation Precautions No active isolations  Medications Medications  HYDROmorphone (DILAUDID) tablet 1 mg (has no administration in time range)  oxyCODONE (Oxy IR/ROXICODONE) immediate release tablet 10 mg (has no administration in time range)  SUMAtriptan (IMITREX) tablet 100 mg (has no administration in time range)  hydrochlorothiazide (HYDRODIURIL) tablet 25 mg (has no administration in time range)  ALPRAZolam (XANAX) tablet 0.5 mg (has no administration in time range)  calcium carbonate (TUMS - dosed in mg elemental calcium) chewable tablet 200 mg of elemental calcium (has no administration in time range)  docusate sodium (COLACE) capsule 100 mg (has no administration in time  range)  pantoprazole (PROTONIX) EC tablet 40 mg (has no administration in time range)  Rivaroxaban TBPK 15 mg (has no administration in time range)  cyclobenzaprine (FLEXERIL) tablet 10 mg (has no administration in time range)  gabapentin (NEURONTIN) capsule 300 mg (has no administration in time range)  MULTI-VITAMIN GUMMIES CHEW (has no administration in time range)  loratadine (CLARITIN) tablet 10 mg (has no administration in time range)  fluticasone (FLONASE) 50 MCG/ACT nasal spray 2 spray (has no administration in time range)  levalbuterol (XOPENEX) nebulizer solution 1.25 mg (has no administration in time range)  acetaminophen (TYLENOL) tablet 650 mg (has no administration in time range)    Or  acetaminophen (TYLENOL) suppository 650 mg (has no administration in time range)  ondansetron (ZOFRAN) tablet 4 mg (has no administration in time range)    Or  ondansetron (ZOFRAN) injection 4 mg (has no administration in time range)  polyethylene glycol (MIRALAX / GLYCOLAX) packet 17 g (has no administration in time range)  sodium chloride 0.9 % bolus 1,000 mL (0 mLs Intravenous Stopped 06/21/18 2031)  acetaminophen (TYLENOL) tablet 1,000 mg (1,000  mg Oral Given 06/21/18 1914)    Mobility walks

## 2018-06-22 DIAGNOSIS — I10 Essential (primary) hypertension: Secondary | ICD-10-CM | POA: Diagnosis not present

## 2018-06-22 DIAGNOSIS — I160 Hypertensive urgency: Secondary | ICD-10-CM | POA: Diagnosis not present

## 2018-06-22 DIAGNOSIS — F411 Generalized anxiety disorder: Secondary | ICD-10-CM | POA: Diagnosis not present

## 2018-06-22 DIAGNOSIS — D72829 Elevated white blood cell count, unspecified: Secondary | ICD-10-CM | POA: Diagnosis not present

## 2018-06-22 LAB — CBC
HCT: 39.2 % (ref 36.0–46.0)
Hemoglobin: 12.9 g/dL (ref 12.0–15.0)
MCH: 29 pg (ref 26.0–34.0)
MCHC: 32.9 g/dL (ref 30.0–36.0)
MCV: 88.1 fL (ref 80.0–100.0)
Platelets: 411 10*3/uL — ABNORMAL HIGH (ref 150–400)
RBC: 4.45 MIL/uL (ref 3.87–5.11)
RDW: 12.3 % (ref 11.5–15.5)
WBC: 8.6 10*3/uL (ref 4.0–10.5)
nRBC: 0 % (ref 0.0–0.2)

## 2018-06-22 LAB — CULTURE, BLOOD (ROUTINE X 2)
Culture: NO GROWTH
Culture: NO GROWTH
Special Requests: ADEQUATE
Special Requests: ADEQUATE

## 2018-06-22 LAB — BASIC METABOLIC PANEL
Anion gap: 9 (ref 5–15)
BUN: 8 mg/dL (ref 6–20)
CO2: 25 mmol/L (ref 22–32)
Calcium: 8.6 mg/dL — ABNORMAL LOW (ref 8.9–10.3)
Chloride: 106 mmol/L (ref 98–111)
Creatinine, Ser: 0.9 mg/dL (ref 0.44–1.00)
GFR calc Af Amer: >60 mL/min (ref >60)
GFR calc non Af Amer: >60 mL/min (ref >60)
Glucose, Bld: 119 mg/dL — ABNORMAL HIGH (ref 70–99)
Potassium: 4 mmol/L (ref 3.5–5.1)
Sodium: 140 mmol/L (ref 135–145)

## 2018-06-22 MED ORDER — RIVAROXABAN 20 MG PO TABS: 20.0000 mg | ORAL_TABLET | Freq: Every day | ORAL | Status: DC

## 2018-06-22 MED ORDER — AMLODIPINE BESYLATE 5 MG PO TABS: 5.0000 mg | ORAL_TABLET | Freq: Every day | ORAL | 0 refills | Status: DC | PRN

## 2018-06-22 MED ORDER — RIVAROXABAN 15 MG PO TABS: 15.0000 mg | ORAL_TABLET | Freq: Two times a day (BID) | ORAL | Status: DC

## 2018-06-22 NOTE — Discharge Summary (Signed)
Physician Discharge Summary  [name] CVE:[id number] DOB: 1975-06-12 DOA: 06/21/2018  PCP: [name], MD  Admit date: 06/21/2018 Discharge date: 06/22/2018  Admitted From: Home Disposition: Home   Recommendations for Outpatient Follow-up:  1. Follow up with PCP in 1-2 weeks 2. Please follow up on the following pending results: Blood cultures (NGTD at discharge)  Home Health: None Equipment/Devices: None Discharge Condition: Stable CODE STATUS: Full Diet recommendation: Heart healthy  Brief/Interim Summary: HPI by Dr. Clyde Lundborg:  [name] is a 43 y.o. female with medical history significant of hypertension, hyperlipidemia, GERD, PE on Xarelto, chronic back pain (s/p of lumbar spine fusion due to spinal stenosis 05/2018), GERD, depression, anxiety, who presents with elevated blood pressure, subjective fever, chills.  Patient was recently hospitalized from 1/17-1/19 due to sepsis/SIRS.  Per discharge summary, pt was tachycardic and febrile, soft blood pressure, elevated lactic acid level and profound leukocytosis. She was treated with empiric antibiotics Vanco and cefepime. Received a dose of Flagyl in the ER. Cultures remain negative. No source of infection was identified.  Patient was discharged on no antibiotics.  Patient states that after she went home, she feels like her symptoms comes back, including subjective fever and chills.  Feels generalized uncomfortable, sweating a lot, warm and hot face and myalgia and HA.  Her blood pressure was elevated at 206/100 at home.  She has a chest heaviness, but no active chest pain.  Denies cough or shortness of breath.  No nausea vomiting, diarrhea, abdominal pain, symptoms of UTI or unilateral weakness.  She has chronic back pain, which has not worsened today.  ED Course: pt was found to have WBC 11.6, Bp 206/100 at home-->155/93 in ED, negative urinalysis, electrolytes renal function okay, temperature 98.4, tachycardia with heart  rate up to 134, tachypnea with RR 23, oxygen saturation 96% on room air.  Chest x-ray negative.  Patient is placed on telemetry bed for observation.  Hospital course:  Leukocytosis resolved spontaneously, remained afebrile. Procalcitonin was undetectable. She remained in no distress and was discharged with plans to follow blood cultures following discharge and see PCP in the outpatient setting for ongoing management of hypertension and anxiety.   Discharge Diagnoses:  Principal Problem:   Hypertensive urgency Active Problems:   ANXIETY DISORDER, GENERALIZED   GERD (gastroesophageal reflux disease)   HTN (hypertension)   Back pain   Pulmonary embolism (HCC)   Migraine   Leukocytosis  Hypertension with hypertensive urgency: Resolved urgency.  - Continue norvasc, though with anxiety possibly contributing, will recommend prn dosing (see patient instructions below). Continue other medications.   Leukocytosis: Resolved without antimicrobials.  - I will continue to check blood cultures daily and contact patient if positive. If symptoms of infection develop, urged to present right away.  - Monitor blood culture as outpatient without abx  Anxiety:  - Continue prn xanax  Continue other home medications for chronic conditions as below  Discharge Instructions Discharge Instructions    Diet - low sodium heart healthy   Complete by:  As directed    Discharge instructions   Complete by:  As directed    You were brought into the hospital for observation without initiation of antibiotics and seem to have improved. Norvasc was given for high blood pressure and can be continued daily as needed for blood pressure that is over systolic (top number) OR over 025ENID diastolic (bottom number) that remains elevated after taking a xanax if anxious. The cultures will continue to be monitored,  but are negative at the time of discharge. You will be contacted if these reveal an infection. It is  important to follow up with your PCP in the next few days-1 week or seek medical attention sooner if you experience shortness of breath, chest pain, fever, headache, or other worrisome symptoms.   Increase activity slowly   Complete by:  As directed      Allergies as of 06/22/2018      Reactions   Gadolinium Derivatives Swelling, Other (See Comments)   Arm swelling, tingling, & redness. Radiologist Dr. Grace Isaac recommends an injection of steroids, or 13-hour prep if non-emergent, if patient needs a gadolinium based contrast in the future.   Other Rash, Other (See Comments)   Glue or tape to seal up wound   Zoloft [sertraline Hcl] Other (See Comments)   Hair loss      Medication List    TAKE these medications   ALPRAZolam 0.5 MG tablet Commonly known as:  XANAX TAKE 1 TABLET BY MOUTH THREE TIMES A DAY AS NEEDED What changed:  reasons to take this   amLODipine 5 MG tablet Commonly known as:  NORVASC Take 1 tablet (5 mg total) by mouth daily as needed (BP >180/100).   calcium carbonate 500 MG chewable tablet Commonly known as:  TUMS - dosed in mg elemental calcium Chew 1 tablet by mouth 2 (two) times daily as needed for indigestion or heartburn.   cetirizine 10 MG tablet Commonly known as:  ZYRTEC Take 10 mg by mouth daily.   cyanocobalamin 1000 MCG/ML injection Commonly known as:  (VITAMIN B-12) INJECT 1 ML (1,000 MCG TOTAL) INTO THE MUSCLE EVERY 7 DAYS. What changed:  See the new instructions.   cyclobenzaprine 10 MG tablet Commonly known as:  FLEXERIL Take 10 mg by mouth 3 (three) times daily as needed for muscle spasms.   docusate sodium 100 MG capsule Commonly known as:  COLACE Take 100 mg by mouth at bedtime.   fluticasone 50 MCG/ACT nasal spray Commonly known as:  FLONASE Place 2 sprays into both nostrils every evening.   gabapentin 300 MG capsule Commonly known as:  NEURONTIN Take 1 capsule (300 mg total) by mouth 3 (three) times daily.   hydrochlorothiazide  25 MG tablet Commonly known as:  HYDRODIURIL TAKE 1 TABLET BY MOUTH EVERY DAY   HYDROmorphone 2 MG tablet Commonly known as:  DILAUDID Take 0.5 tablets (1 mg total) by mouth every 6 (six) hours as needed for severe pain.   MULTI-VITAMIN GUMMIES PO Take 2 tablets by mouth daily.   nitrofurantoin (macrocrystal-monohydrate) 100 MG capsule Commonly known as:  MACROBID Take 100 mg by mouth daily as needed (for uti prophylaxis, after intercourse).   oxyCODONE 15 MG immediate release tablet Commonly known as:  ROXICODONE Take 1 tablet (15 mg total) by mouth every 4 (four) hours as needed for up to 30 days for pain. Start taking on:  August 09, 2018   pantoprazole 40 MG tablet Commonly known as:  PROTONIX Take 1 tablet (40 mg total) by mouth daily.   potassium chloride 10 MEQ tablet Commonly known as:  KLOR-CON 10 Take 1 tablet (10 mEq total) by mouth daily. What changed:    when to take this  reasons to take this   promethazine 25 MG tablet Commonly known as:  PHENERGAN TAKE 1 TABLET BY MOUTH EVERY 6 HOURS AS NEEDED FOR NAUSEA What changed:    reasons to take this  additional instructions   Rivaroxaban 15 &  20 MG Tbpk Take as directed on package: Start with one 15mg  tablet by mouth twice a day with food. On Day 22, switch to one 20mg  tablet once a day with food. Pharmacy: Please discard first tablet from the pack which she has received in the hospital.   simethicone 80 MG chewable tablet Commonly known as:  MYLICON Chew 160 mg by mouth every 6 (six) hours as needed (gas pain).   SUMAtriptan 100 MG tablet Commonly known as:  IMITREX Take 1 tablet earliest onset of migraine.  May repeat once in 2 hours if headache persists or recurs. What changed:    how much to take  how to take this  when to take this  reasons to take this   SYRINGE-NEEDLE (DISP) 3 ML 25G X 1" 3 ML Misc Commonly known as:  B-D 3CC LUER-LOK SYR 25GX1" USE AS DIRECTED   Vitamin D  (Ergocalciferol) 1.25 MG (50000 UT) Caps capsule Commonly known as:  DRISDOL TAKE 1 CAPSULE (50,000 UNITS TOTAL) BY MOUTH EVERY 7 (SEVEN) DAYS.     ASK your doctor about these medications   fluconazole 100 MG tablet Commonly known as:  DIFLUCAN Take 1 tablet (100 mg total) by mouth daily for 3 days. Ask about: Should I take this medication?      Follow-up Information    Nelwyn SalisburyFry, [name], MD. Schedule an appointment as soon as possible for a visit in 2 day(s).   Specialty:  Family Medicine Contact information: [address] [place] [address] [phone]          Allergies  Allergen Reactions  . Gadolinium Derivatives Swelling and Other (See Comments)    Arm swelling, tingling, & redness. Radiologist Dr. Grace IsaacWatts recommends an injection of steroids, or 13-hour prep if non-emergent, if patient needs a gadolinium based contrast in the future.  . Other Rash and Other (See Comments)    Glue or tape to seal up wound  . Zoloft [Sertraline Hcl] Other (See Comments)    Hair loss    Consultations:  None  Procedures/Studies: Dg Chest 2 View  Result Date: 06/21/2018 CLINICAL DATA:  Dyspnea, recently diagnosed with pulmonary embolus EXAM: CHEST - 2 VIEW COMPARISON:  06/17/2018 CT FINDINGS: The heart size and mediastinal contours are within normal limits. Both lungs are clear. The visualized skeletal structures are unremarkable. IMPRESSION: No active cardiopulmonary disease. Electronically Signed   By: Tollie Ethavid  Kwon M.D.   On: 06/21/2018 18:17   Dg Chest 2 View  Result Date: 06/17/2018 CLINICAL DATA:  43 year old female with chest pain. EXAM: CHEST - 2 VIEW COMPARISON:  Chest CT dated 06/06/2018 FINDINGS: The heart size and mediastinal contours are within normal limits. Both lungs are clear. The visualized skeletal structures are unremarkable. IMPRESSION: No active cardiopulmonary disease. Electronically Signed   By: Elgie CollardArash  Radparvar M.D.   On: 06/17/2018 01:35   Ct Angio  Chest Pe W And/or Wo Contrast  Result Date: 06/17/2018 CLINICAL DATA:  Chest pain, shortness of breath, history of right lower lobe pulmonary emboli EXAM: CT ANGIOGRAPHY CHEST WITH CONTRAST TECHNIQUE: Multidetector CT imaging of the chest was performed using the standard protocol during bolus administration of intravenous contrast. Multiplanar CT image reconstructions and MIPs were obtained to evaluate the vascular anatomy. CONTRAST:  100mL ISOVUE-370 IOPAMIDOL (ISOVUE-370) INJECTION 76% COMPARISON:  CTA chest dated 06/06/2018 FINDINGS: Cardiovascular: Satisfactory opacification of the bilateral pulmonary arteries to the segmental level. Tiny segmental filling defect within the right lower lobe pulmonary artery (series 12/image 155). Additional  tiny subsegmental filling defect with a right lower lobe pulmonary artery (series 12/image 133). This appearance is similar versus mildly improved from the prior. Overall clot burden is minimal.  No evidence of right heart strain. Heart is normal in size.  No pericardial effusion. No evidence of thoracic aortic aneurysm or dissection. Mediastinum/Nodes: No suspicious mediastinal lymphadenopathy. Visualized thyroid is unremarkable. Lungs/Pleura: Platelike scarring/atelectasis in the right lower lobe. No focal consolidation. No suspicious pulmonary nodules. No pleural effusion or pneumothorax. Upper Abdomen: Visualized upper abdomen is notable for moderate to severe hepatic steatosis and prior cholecystectomy. Musculoskeletal: Mild degenerative changes of the mid thoracic spine. Review of the MIP images confirms the above findings. IMPRESSION: Tiny segmental/subsegmental pulmonary emboli within the right lower lobe pulmonary artery, stable versus mildly improved from recent CTA chest. Electronically Signed   By: Charline Bills M.D.   On: 06/17/2018 03:29   Ct Angio Chest Pe W Or Wo Contrast  Result Date: 06/06/2018 CLINICAL DATA:  Shortness of breath and tachycardia  EXAM: CT ANGIOGRAPHY CHEST WITH CONTRAST TECHNIQUE: Multidetector CT imaging of the chest was performed using the standard protocol during bolus administration of intravenous contrast. Multiplanar CT image reconstructions and MIPs were obtained to evaluate the vascular anatomy. CONTRAST:  69 mL ISOVUE-370 IOPAMIDOL (ISOVUE-370) INJECTION 76% COMPARISON:  CT angiogram chest September 03, 2016 FINDINGS: Cardiovascular: There are incompletely obstructing pulmonary emboli in branches of the right lower lobe pulmonary artery. No more central pulmonary emboli evident. There is no appreciable right heart strain. There is no appreciable thoracic aortic aneurysm or dissection. Visualized great vessels appear unremarkable. There is no pericardial effusion or pericardial thickening evident. Mediastinum/Nodes: Visualized thyroid appears normal. There is no appreciable thoracic adenopathy. No esophageal lesions are appreciable. Lungs/Pleura: There is atelectatic change in the lower lobe regions. There is no edema or consolidation. No pleural effusion or pleural thickening evident. Upper Abdomen: There is hepatic steatosis. Visualized upper abdominal structures otherwise appear unremarkable. Musculoskeletal: There are foci of degenerative change in the thoracic spine. There are no appreciable blastic or lytic bone lesions. No chest wall lesions are evident. Review of the MIP images confirms the above findings. IMPRESSION: 1. Foci of incompletely obstructing pulmonary embolus in lower lobe pulmonary artery branches on the right. No more central pulmonary embolus evident. No thoracic aortic aneurysm or dissection. No right heart strain. 2.  Mild bibasilar atelectasis.  No lung edema or consolidation. 3.  No appreciable thoracic adenopathy. 4.  Hepatic steatosis. Critical Value/emergent results were called by telephone at the time of interpretation on 06/06/2018 at 5:29 pm to Dr. Jacalyn Lefevre , who verbally acknowledged these results.  Electronically Signed   By: Bretta Bang III M.D.   On: 06/06/2018 17:29   Vas [place] Lower Extremity Venous (dvt)  Result Date: 06/07/2018  Lower Venous Study Indications: Pulmonary embolism.  Performing Technologist: Chanda Busing RVT  Examination Guidelines: A complete evaluation includes B-mode imaging, spectral Doppler, color Doppler, and power Doppler as needed of all accessible portions of each vessel. Bilateral testing is considered an integral part of a complete examination. Limited examinations for reoccurring indications may be performed as noted.  Right Venous Findings: +---------+---------------+---------+-----------+----------+-------+          CompressibilityPhasicitySpontaneityPropertiesSummary +---------+---------------+---------+-----------+----------+-------+ CFV      Full           Yes      Yes                          +---------+---------------+---------+-----------+----------+-------+  SFJ      Full                                                 +---------+---------------+---------+-----------+----------+-------+ FV Prox  Full                                                 +---------+---------------+---------+-----------+----------+-------+ FV Mid   Full                                                 +---------+---------------+---------+-----------+----------+-------+ FV DistalFull                                                 +---------+---------------+---------+-----------+----------+-------+ PFV      Full                                                 +---------+---------------+---------+-----------+----------+-------+ POP      Full           Yes      Yes                          +---------+---------------+---------+-----------+----------+-------+ PTV      Full                                                 +---------+---------------+---------+-----------+----------+-------+ PERO     Full                                                  +---------+---------------+---------+-----------+----------+-------+  Left Venous Findings: +---------+---------------+---------+-----------+----------+-------+          CompressibilityPhasicitySpontaneityPropertiesSummary +---------+---------------+---------+-----------+----------+-------+ CFV      Full           Yes      Yes                          +---------+---------------+---------+-----------+----------+-------+ SFJ      Full                                                 +---------+---------------+---------+-----------+----------+-------+ FV Prox  Full                                                 +---------+---------------+---------+-----------+----------+-------+  FV Mid   Full                                                 +---------+---------------+---------+-----------+----------+-------+ FV DistalFull                                                 +---------+---------------+---------+-----------+----------+-------+ PFV      Full                                                 +---------+---------------+---------+-----------+----------+-------+ POP      Full           Yes      Yes                          +---------+---------------+---------+-----------+----------+-------+ PTV      Full                                                 +---------+---------------+---------+-----------+----------+-------+ PERO     Full                                                 +---------+---------------+---------+-----------+----------+-------+    Summary: Right: There is no evidence of deep vein thrombosis in the lower extremity. No cystic structure found in the popliteal fossa. Left: There is no evidence of deep vein thrombosis in the lower extremity. No cystic structure found in the popliteal fossa.  *See table(s) above for measurements and observations. Electronically signed by Tonny BollmanMichael Cooper MD on 06/07/2018 at  8:06:17 PM.    Final      Subjective: No fevers overnight.   Discharge Exam: Vitals:   06/21/18 2322 06/22/18 0533  BP:  127/86  Pulse:  71  Resp:  20  Temp:  97.9 F (36.6 C)  SpO2: 95% 96%   General: Pt is alert, awake, not in acute distress Cardiovascular: RRR, S1/S2 +, no rubs, no gallops Respiratory: CTA bilaterally, no wheezing, no rhonchi Abdominal: Soft, NT, ND, bowel sounds + Extremities: No edema, no cyanosis  Labs: BNP (last 3 results) Recent Labs    06/06/18 1605 06/21/18 2219  BNP 11.9 16.8   Basic Metabolic Panel: Recent Labs  Lab 06/22/18 0443  NA 140  K 4.0  CL 106  CO2 25  GLUCOSE 119*  BUN 8  CREATININE 0.90  CALCIUM 8.6*   Liver Function Tests: No results for input(s): AST, ALT, ALKPHOS, BILITOT, PROT, ALBUMIN in the last 168 hours. No results for input(s): LIPASE, AMYLASE in the last 168 hours. No results for input(s): AMMONIA in the last 168 hours. CBC: Recent Labs  Lab 06/22/18 0443  WBC 8.6  HGB 12.9  HCT 39.2  MCV 88.1  PLT 411*   Cardiac Enzymes: No results for input(s): CKTOTAL, CKMB,  CKMBINDEX, TROPONINI in the last 168 hours. BNP: Invalid input(s): POCBNP CBG: No results for input(s): GLUCAP in the last 168 hours. D-Dimer No results for input(s): DDIMER in the last 72 hours. Hgb A1c No results for input(s): HGBA1C in the last 72 hours. Lipid Profile No results for input(s): CHOL, HDL, LDLCALC, TRIG, CHOLHDL, LDLDIRECT in the last 72 hours. Thyroid function studies No results for input(s): TSH, T4TOTAL, T3FREE, THYROIDAB in the last 72 hours.  Invalid input(s): FREET3 Anemia work up No results for input(s): VITAMINB12, FOLATE, FERRITIN, TIBC, IRON, RETICCTPCT in the last 72 hours. Urinalysis    Component Value Date/Time   COLORURINE YELLOW 06/21/2018 1915   APPEARANCEUR CLEAR 06/21/2018 1915   LABSPEC 1.015 06/21/2018 1915   PHURINE 7.0 06/21/2018 1915   GLUCOSEU NEGATIVE 06/21/2018 1915   HGBUR NEGATIVE  06/21/2018 1915   HGBUR negative 04/04/2007 1513   BILIRUBINUR NEGATIVE 06/21/2018 1915   BILIRUBINUR negative 12/24/2017 1316   KETONESUR NEGATIVE 06/21/2018 1915   PROTEINUR NEGATIVE 06/21/2018 1915   UROBILINOGEN 0.2 12/24/2017 1316   UROBILINOGEN 0.2 04/08/2015 1242   NITRITE NEGATIVE 06/21/2018 1915   LEUKOCYTESUR NEGATIVE 06/21/2018 1915    Microbiology Recent Results (from the past 240 hour(s))  Culture, blood (Routine X 2) w Reflex to ID Panel     Status: None   Collection Time: 06/21/18 10:19 PM  Result Value Ref Range Status   Specimen Description   Final    BLOOD RIGHT ANTECUBITAL Performed at [facility], [address], [place], [place] [address]    Special Requests   Final    BOTTLES DRAWN AEROBIC AND ANAEROBIC Blood Culture adequate volume Performed at [facility], [address], [place], [place] [address]    Culture   Final    NO GROWTH 5 DAYS Performed at [facility], [address] [place], [place] [address]    Report Status 06/27/2018 FINAL  Final  Culture, blood (Routine X 2) w Reflex to ID Panel     Status: None   Collection Time: 06/21/18 11:18 PM  Result Value Ref Range Status   Specimen Description   Final    BLOOD LEFT HAND Performed at [facility], [address] [place], [place] [address]    Special Requests   Final    BOTTLES DRAWN AEROBIC AND ANAEROBIC Blood Culture adequate volume Performed at [facility], [address], [place], [place] [address]    Culture   Final    NO GROWTH 5 DAYS Performed at [facility], [address], [place], [place] [address]    Report Status 06/27/2018 FINAL  Final    Time coordinating discharge: Approximately 40 minutes  Tyrone Nine, MD  [facility] 06/28/2018, 7:03 PM Pager [phone]

## 2018-06-23 NOTE — Telephone Encounter (Signed)
Pt did end up in the ER and did get admitted.  See notes in her chart.

## 2018-06-23 NOTE — Telephone Encounter (Signed)
She should come in OV

## 2018-06-27 LAB — CULTURE, BLOOD (ROUTINE X 2)
Culture: NO GROWTH
Culture: NO GROWTH
Special Requests: ADEQUATE
Special Requests: ADEQUATE

## 2018-06-29 ENCOUNTER — Telehealth: Payer: Self-pay | Admitting: Family Medicine

## 2018-06-29 NOTE — Telephone Encounter (Signed)
Copied from CRM [id number]. Topic: Quick Communication - Rx Refill/Question >> Jun 29, 2018  5:59 PM Vernita Tague, Gwenith Spitz, RN wrote: Medication: ***  Has the patient contacted their pharmacy?  (Agent: If no, request that the patient contact the pharmacy for the refill.) (Agent: If yes, when and what did the pharmacy advise?)  Preferred Pharmacy (with phone number or street name): ***  Agent: Please be advised that RX refills may take up to 3 business days. We ask that you follow-up with your pharmacy.  Reason for CRM: ***

## 2018-06-30 NOTE — Telephone Encounter (Signed)
Opened in error

## 2018-07-01 ENCOUNTER — Telehealth: Payer: Self-pay | Admitting: Hematology

## 2018-07-01 ENCOUNTER — Encounter: Payer: Self-pay | Admitting: Family Medicine

## 2018-07-01 ENCOUNTER — Encounter: Payer: Self-pay | Admitting: Hematology

## 2018-07-01 NOTE — Telephone Encounter (Signed)
A new hem appt has been scheduled for the pt to see Dr. Candise Che on 2/24 at 1pm. Pt agreed to the appt date and time.

## 2018-07-04 ENCOUNTER — Encounter: Payer: Self-pay | Admitting: Internal Medicine

## 2018-07-04 ENCOUNTER — Ambulatory Visit: Payer: BLUE CROSS/BLUE SHIELD | Admitting: Internal Medicine

## 2018-07-04 VITALS — BP 130/96 | HR 113 | Ht 69.0 in | Wt 275.2 lb

## 2018-07-04 DIAGNOSIS — I951 Orthostatic hypotension: Secondary | ICD-10-CM

## 2018-07-04 DIAGNOSIS — R000 Tachycardia, unspecified: Secondary | ICD-10-CM | POA: Diagnosis not present

## 2018-07-04 DIAGNOSIS — I10 Essential (primary) hypertension: Secondary | ICD-10-CM

## 2018-07-04 MED ORDER — NEBIVOLOL HCL 5 MG PO TABS: 5.0000 mg | ORAL_TABLET | Freq: Every day | ORAL | 3 refills | Status: DC

## 2018-07-04 NOTE — Patient Instructions (Addendum)
  Medication Instructions:  Your physician has recommended you make the following change in your medication:   1. Begin Bystolic, 5mg  tablet, once daily.  Labwork: None ordered.  Testing/Procedures: None ordered.  Follow-Up: Your physician recommends that you schedule a follow-up appointment in:   6 weeks with Dr Graciela Husbands  Any Other Special Instructions Will Be Listed Below (If Applicable).     If you need a refill on your cardiac medications before your next appointment, please call your pharmacy.

## 2018-07-04 NOTE — Progress Notes (Signed)
Patient Care Team: Nelwyn SalisburyFry, [name], MD as PCP - Margretta SidleGeneral Cram, Gary, MD as Consulting Physician (Neurosurgery)   HPI  [name] [address]P Franchot GalloBickar is a 43 y.o. female Seen in follow-up for a history of sinus tachycardia  Evaluation has included a normal TSH, hemoglobin, surgery clinic chronic connective tissue evaluation was negative. Urine for catecholamines is abnormal but CT was negative. Follow.    . she has not tolerated beta blockers in the past his fatigue or loss. Including metoprolol succinate/atenolol  he has also been on verapamil and diltiazem the past.  she has heat intolerance, shower intolerance and some Orthostasis   , she is s/p hysterectomy    Echocardiogram 4/18 shows normal LV function**and if anything small chamber size.  Underwent spinal fusion 12/19 complicated by pulmonary embolism for which she was started on rivaroxaban.  Subsequently readmitted with fevers and chills.  Blood cultures were negative.   Readmitted a few days later with similar complaints but was noted to be afebrile.  In the wake of her dyspnea and pulmonary embolism, her functional status has deteriorated rapidly.  She finds her self dizzy and weak with prolonged sitting.  She is largely unable to ambulate because of this same weakness.  She has shower intolerance.   Prolonged standing or mild exertion is associated with profound tachypalpitations and tachycardia.  She says heart rates in the 180s.  Fluid intake is copious.  Salt intake is adequate.  She is periodically in tears.  She has not tolerated antidepressants.  Pre-orthopedic surgery she was undergoing counseling which has now been suspended     Past Medical History:  Diagnosis Date  . Allergy   . Anxiety   . Arthritis   . B12 deficiency   . Chronic narcotic use   . Depression    not currently   . Dysrhythmia    hx tachy-was from medication nortriptylline  . Esophagitis    rx  . Fibromyalgia   . GERD (gastroesophageal reflux  disease)   . History of echocardiogram    Echo 4/18: Mild concentric LVH, vigorous LVF, EF 65-70, normal wall motion, grade 1 diastolic dysfunction  . History of kidney stones   . Hyperlipidemia   . Intervertebral disc protrusion 01/11/2014  . Migraine   . Obesity (BMI 35.0-39.9 without comorbidity)   . Peripheral neuropathy   . Polycystic ovaries   . Pulmonary embolus (HCC)   . Spinal stenosis, lumbar   . Vitamin D deficiency     Past Surgical History:  Procedure Laterality Date  . 24 HOUR PH STUDY N/A 05/27/2016   Procedure: 24 HOUR PH STUDY;  Surgeon: Ruffin FrederickSteven Paul Armbruster, MD;  Location: [facility];  Service: Gastroenterology;  Laterality: N/A;  . ABDOMINAL EXPOSURE N/A 05/04/2018   Procedure: ABDOMINAL EXPOSURE;  Surgeon: Larina EarthlyEarly, Todd F, MD;  [facility];  Service: Vascular;  Laterality: N/A;  . ANTERIOR LUMBAR FUSION N/A 05/04/2018   Procedure: Lumbar Five Sacral One Anterior lumbar interbody fusion;  Surgeon: Donalee Citrinram, Gary, MD;  [facility];  Service: Neurosurgery;  Laterality: N/A;  Lumbar Five Sacral One Anterior lumbar interbody fusion  . APPENDECTOMY    . BACK SURGERY  2012   left laminectomy at L3-4 per Dr. Phoebe PerchHirsch   . bladder tack  2012  . CHOLECYSTECTOMY N/A 10/03/2015   Procedure: LAPAROSCOPIC CHOLECYSTECTOMY;  Surgeon: Rodman PickleLuke Aaron Kinsinger, MD;  [facility];  Service: General;  Laterality: N/A;  . ESOPHAGEAL MANOMETRY N/A 05/27/2016   Procedure:  ESOPHAGEAL MANOMETRY (EM);  Surgeon: Ruffin Frederick, MD;  Location: [facility];  Service: Gastroenterology;  Laterality: N/A;  . EXCISION OF SKIN TAG  10/03/2015   Procedure: EXCISION OF SKIN TAG;  Surgeon: De Blanch Kinsinger, MD;  Location: [facility] OR;  Service: General;;  . LUMBAR DISC SURGERY  01-31-14   per Dr. [name]   . OOPHORECTOMY     left overy  . OVARIAN CYST REMOVAL    . RECTOCELE REPAIR    . SINUSOTOMY  12/14/06  . spinal fusion  2016/10  . TONSILLECTOMY    . TONSILLECTOMY    . VAGINAL HYSTERECTOMY     . WRIST GANGLION EXCISION Left     Current Outpatient Medications  Medication Sig Dispense Refill  . ALPRAZolam (XANAX) 0.5 MG tablet TAKE 1 TABLET BY MOUTH THREE TIMES A DAY AS NEEDED 90 tablet 5  . amLODipine (NORVASC) 5 MG tablet Take 1 tablet (5 mg total) by mouth daily as needed (BP >180/100). 10 tablet 0  . calcium carbonate (TUMS - DOSED IN MG ELEMENTAL CALCIUM) 500 MG chewable tablet Chew 1 tablet by mouth 2 (two) times daily as needed for indigestion or heartburn.    . cetirizine (ZYRTEC) 10 MG tablet Take 10 mg by mouth daily.    . cyanocobalamin (,VITAMIN B-12,) 1000 MCG/ML injection INJECT 1 ML (1,000 MCG TOTAL) INTO THE MUSCLE EVERY 7 DAYS. 12 mL 2  . cyclobenzaprine (FLEXERIL) 10 MG tablet Take 10 mg by mouth 3 (three) times daily as needed for muscle spasms.    Marland Kitchen docusate sodium (COLACE) 100 MG capsule Take 100 mg by mouth at bedtime.    . fluticasone (FLONASE) 50 MCG/ACT nasal spray Place 2 sprays into both nostrils every evening.     . gabapentin (NEURONTIN) 300 MG capsule Take 1 capsule (300 mg total) by mouth 3 (three) times daily. 90 capsule 3  . hydrochlorothiazide (HYDRODIURIL) 25 MG tablet TAKE 1 TABLET BY MOUTH EVERY DAY 30 tablet 2  . HYDROmorphone (DILAUDID) 2 MG tablet Take 0.5 tablets (1 mg total) by mouth every 6 (six) hours as needed for severe pain. 30 tablet 0  . Multiple Vitamins-Minerals (MULTI-VITAMIN GUMMIES PO) Take 2 tablets by mouth daily.     . nitrofurantoin, macrocrystal-monohydrate, (MACROBID) 100 MG capsule Take 100 mg by mouth daily as needed (for uti prophylaxis, after intercourse).   2  . [START ON 08/09/2018] oxyCODONE (ROXICODONE) 15 MG immediate release tablet Take 1 tablet (15 mg total) by mouth every 4 (four) hours as needed for up to 30 days for pain. 180 tablet 0  . pantoprazole (PROTONIX) 40 MG tablet Take 1 tablet (40 mg total) by mouth daily.    . potassium chloride (KLOR-CON 10) 10 MEQ tablet Take 1 tablet (10 mEq total) by mouth daily.  90 tablet 3  . promethazine (PHENERGAN) 25 MG tablet TAKE 1 TABLET BY MOUTH EVERY 6 HOURS AS NEEDED FOR NAUSEA 90 tablet 1  . Rivaroxaban 15 & 20 MG TBPK Take as directed on package: Start with one 15mg  tablet by mouth twice a day with food. On Day 22, switch to one 20mg  tablet once a day with food. Pharmacy: Please discard first tablet from the pack which she has received in the hospital. 51 each 0  . simethicone (MYLICON) 80 MG chewable tablet Chew 160 mg by mouth every 6 (six) hours as needed (gas pain).    . SUMAtriptan (IMITREX) 100 MG tablet Take 1 tablet earliest onset of migraine.  May  repeat once in 2 hours if headache persists or recurs. 10 tablet 2  . SYRINGE-NEEDLE, DISP, 3 ML (B-D 3CC LUER-LOK SYR 25GX1") 25G X 1" 3 ML MISC USE AS DIRECTED 9 each 11  . Vitamin D, Ergocalciferol, (DRISDOL) 50000 units CAPS capsule TAKE 1 CAPSULE (50,000 UNITS TOTAL) BY MOUTH EVERY 7 (SEVEN) DAYS. 12 capsule 3   No current facility-administered medications for this visit.     Allergies  Allergen Reactions  . Gadolinium Derivatives Swelling and Other (See Comments)    Arm swelling, tingling, & redness. Radiologist Dr. Grace Isaac recommends an injection of steroids, or 13-hour prep if non-emergent, if patient needs a gadolinium based contrast in the future.  . Other Rash and Other (See Comments)    Glue or tape to seal up wound  . Zoloft [Sertraline Hcl] Other (See Comments)    Hair loss      Review of Systems negative except from HPI and PMH  Physical Exam BP (!) 130/96   Pulse (!) 113   Ht 5\' 9"  (1.753 m)   Wt 275 lb 3.2 oz (124.8 kg)   LMP 12/13/1999 Comment: single oophorectomy//a.c.  SpO2 99%   BMI 40.64 kg/m  Well developed and nourished intermittently weeping HENT normal Neck supple with JVP-flat Clear Regular rate and rhythm, no murmurs or gallops Abd-soft with active BS No Clubbing cyanosis edema Skin-warm and dry A & Oriented  Grossly normal sensory and motor function    ECG  demonstrates * sinus @ 113\ Assessment and Plan  Tachycardia-sinus  Hypertension -orthostatic  Orthostatic intolerance  Stress/depression     Patient's functional status is deteriorated significantly concomitant with her pulmonary embolism.  Not quite sure of the association.  I do not know whether there is an impact on transpulmonary blood flow which is that aggravates her orthostatic symptoms.  She continues to manifest orthostatic hypertension and modest tachycardia suggestive of catecholamine excess.  We will measure urine catecholamines again; apparently they normalize following the discontinuation of amitriptyline last time.  She had problems with alopecia with atenolol; we will try her on Bystolic.  Unfortunately the alopecia can be a class effect.  We can try diltiazem later  Stressed the importance of sitting up.  In the intolerant of antidepressants in the past.  She was doing counseling.  This is been on hold because of her difficulty standing and moving around.  More than 50% of 40 min was spent in counseling related to the above

## 2018-07-04 NOTE — Telephone Encounter (Signed)
Dr. Clent RidgesFry please advise on rx for the xarelto.  thanks

## 2018-07-05 ENCOUNTER — Other Ambulatory Visit: Payer: Self-pay | Admitting: Family Medicine

## 2018-07-06 ENCOUNTER — Other Ambulatory Visit: Payer: Self-pay | Admitting: Gastroenterology

## 2018-07-06 ENCOUNTER — Other Ambulatory Visit: Payer: Self-pay

## 2018-07-06 ENCOUNTER — Telehealth: Payer: Self-pay

## 2018-07-06 MED ORDER — RIVAROXABAN 20 MG PO TABS: 20.0000 mg | ORAL_TABLET | Freq: Every day | ORAL | 0 refills | Status: DC

## 2018-07-06 NOTE — Telephone Encounter (Signed)
**Note De-Identified [name] Obfuscation** I have done a Bystolic PA through covermymeds. TLX:[id number]  I received the following message:  [name] Key: [id number] - PA Case ID: [id number] - Rx #: [id number] Need Outcome Approved today CaseId:[id number];Status:Approved;Review Type:Prior Auth;Coverage Start Date:06/06/2018;Coverage End Date:07/06/2019

## 2018-07-06 NOTE — Telephone Encounter (Signed)
Call in #[phone] with no rf of Xarelto 20 mg daily

## 2018-07-07 ENCOUNTER — Telehealth: Payer: Self-pay

## 2018-07-07 NOTE — Telephone Encounter (Signed)
Approved on February 5  CaseId:[id number]; Status:Approved; Review Type:Prior Auth; Coverage Start Date:06/06/2018; Coverage End Date:07/06/2019;

## 2018-07-07 NOTE — Telephone Encounter (Signed)
PA for rivaroxaban (XARELTO) 20 MG TABS tablet sent to [employer].  Key[employer]leda Clarks - PA Case ID: [id number] - Rx #: [id number]

## 2018-07-07 NOTE — Telephone Encounter (Signed)
Pt states that [facility] told her that the xarelto was denied due to no dx code being on the PA. Pt states they stated an appeal would have to be done. Appeal phone #: [phone]

## 2018-07-08 NOTE — Telephone Encounter (Signed)
Please follow up with [facility] asap. Thanks!

## 2018-07-08 NOTE — Telephone Encounter (Signed)
Patient is calling to follow up on this. Please advise.   [phone]

## 2018-07-10 ENCOUNTER — Other Ambulatory Visit: Payer: Self-pay | Admitting: Internal Medicine

## 2018-07-10 DIAGNOSIS — R000 Tachycardia, unspecified: Secondary | ICD-10-CM | POA: Diagnosis not present

## 2018-07-10 DIAGNOSIS — I10 Essential (primary) hypertension: Secondary | ICD-10-CM | POA: Diagnosis not present

## 2018-07-11 NOTE — Telephone Encounter (Signed)
I have attempted to call [facility] back at the number given. Both times I remained on hold for over 45 minutes and never reached anyone.

## 2018-07-12 NOTE — Telephone Encounter (Signed)
Pt is calling back in very anxious about getting medication. Pt says that she has been speaking with insurance and pharmacy and they stated that they only need to have Dx code in order to fill medication.   Pt says that she has been without this medication since Friday and is nervous because pt says that she shouldn't be without medication cold [place]. Pt would like further assistance with this today if possible.   Please assist.

## 2018-07-12 NOTE — Telephone Encounter (Signed)
PA has been approved. Case # [id number].

## 2018-07-12 NOTE — Telephone Encounter (Signed)
Spoke with the appeals department. They stated I would have to start a new PA for this medication. I have done so over the phone. Awaiting response.

## 2018-07-12 NOTE — Telephone Encounter (Signed)
LM informing patient.

## 2018-07-22 NOTE — Progress Notes (Signed)
HEMATOLOGY/ONCOLOGY CONSULTATION NOTE  Date of Service: 07/25/2018  Patient Care Team: [name], MD as PCP - Margretta Sidle, MD as Consulting Physician (Neurosurgery)  CHIEF COMPLAINTS/PURPOSE OF CONSULTATION:  History of Pulmonary Embolism  HISTORY OF PRESENTING ILLNESS:   [name] is a wonderful 43 y.o. female who has been referred to [place] by Dr. Gershon Crane for evaluation and management of History of Pulmonary Embolism. The pt reports that she is doing well overall.   Of note prior to the patient's visit today, she presented to the ED on 06/06/18 with SOB and was diagnosed with a PE with a CTA Chest. The pt has a history of lumbar spinal stenosis and had a lumbar fusion on 05/04/18.  She was discharged with 20mg  Xarelto.  The pt reports that this was her fifth back surgery, and second fusion. She notes that she was recovering from her surgery fairly, but around 05/25/18 she began feeling bad- with increased SOB. She notes that she was up and about in the weeks after her surgery, getting up "a couple times every day." She notes that the night before her new SOB, she had "intense pain" in her upper thighs. Subsequent DVTs in the ED ruled out DVT. The pt has never had a previous blood clot. She denies taking hormonal contraception, and had a hysterectomy in February 2001. The pt can't remember if she had blood thinners around the time of this most recent surgery. She notes that bone grafts were used in her most recent surgery as well.   The pt notes that she took PO contraceptives for 6 months, more than 20 years ago. She has had two pregnancies and two children. She has had at least 6 total surgeries, the last of which resulted in her only blood clot.  The pt notes that she currently is experiencing SOB and back pain after standing up and walking to the kitchen. She will be beginning PT next week. She is not sure if some of this is related to her anxiety. She notes that her  lowest O2 was not below 90% while ambulating. She notes that her pain and SOB improve after lying down.  The pt has recently had high blood pressure and has recently begun a BP medication. She notes that she has recently had concern for untreated sleep apnea.  The pt notes that her mother is very immobile at this time, and has had DVTs in the last couple years. The pt also notes that her mother previously had multiple surgeries and children without developing blood clots.  The pt endorses having anxiety in general, depression, and OCD tendencies. She notes that she has pursued TMS therapy, and sees a therapist every 2 weeks.  Of note prior to the patient's visit today, pt has had a CTA Chest completed on 06/06/18 with results revealing Foci of incompletely obstructing pulmonary embolus in lower lobe pulmonary artery branches on the right. No more central pulmonary embolus evident. No thoracic aortic aneurysm or dissection. No right heart strain. 2.  Mild bibasilar atelectasis.  No lung edema or consolidation. 3.  No appreciable thoracic adenopathy. 4.  Hepatic steatosis.  Most recent lab results (06/22/18) of CBC and BMP is as follows: all values are WNL except for PLT at 411k, Glucose at 119, Calcium at 8.6.  On review of systems, pt reports recent weight gain, recent SOB while ambulating, back pain, anxiety, and denies issues bleeding, leg swelling, abdominal pain, CP, and any other  symptoms.   On PMHx the pt reports lumbar spinal stenosis s/p lumbar fusion surgery on 05/04/18, hysterectomy. On Social Hx the pt reports she quit smoking cigarettes 23 years ago, denies consuming alcohol. Denies second hand smoke exposure.  On Family Hx the pt reports mother with DVTs   MEDICAL HISTORY:  Past Medical History:  Diagnosis Date  . Allergy   . Anxiety   . Arthritis   . B12 deficiency   . Chronic narcotic use   . Depression    not currently   . Dysrhythmia    hx tachy-was from medication  nortriptylline  . Esophagitis    rx  . Fibromyalgia   . GERD (gastroesophageal reflux disease)   . History of echocardiogram    Echo 4/18: Mild concentric LVH, vigorous LVF, EF 65-70, normal wall motion, grade 1 diastolic dysfunction  . History of kidney stones   . Hyperlipidemia   . Intervertebral disc protrusion 01/11/2014  . Migraine   . Obesity (BMI 35.0-39.9 without comorbidity)   . Peripheral neuropathy   . Polycystic ovaries   . Pulmonary embolus (HCC)   . Spinal stenosis, lumbar   . Vitamin D deficiency     SURGICAL HISTORY: Past Surgical History:  Procedure Laterality Date  . 24 HOUR PH STUDY N/A 05/27/2016   Procedure: 24 HOUR PH STUDY;  Surgeon: Ruffin FrederickSteven Paul Armbruster, MD;  Location: [facility];  Service: Gastroenterology;  Laterality: N/A;  . ABDOMINAL EXPOSURE N/A 05/04/2018   Procedure: ABDOMINAL EXPOSURE;  Surgeon: Larina EarthlyEarly, Todd F, MD;  [facility];  Service: Vascular;  Laterality: N/A;  . ANTERIOR LUMBAR FUSION N/A 05/04/2018   Procedure: Lumbar Five Sacral One Anterior lumbar interbody fusion;  Surgeon: Donalee Citrinram, Gary, MD;  [facility];  Service: Neurosurgery;  Laterality: N/A;  Lumbar Five Sacral One Anterior lumbar interbody fusion  . APPENDECTOMY    . BACK SURGERY  2012   left laminectomy at L3-4 per Dr. Phoebe PerchHirsch   . bladder tack  2012  . CHOLECYSTECTOMY N/A 10/03/2015   Procedure: LAPAROSCOPIC CHOLECYSTECTOMY;  Surgeon: Rodman PickleLuke Aaron Kinsinger, MD;  [facility];  Service: General;  Laterality: N/A;  . ESOPHAGEAL MANOMETRY N/A 05/27/2016   Procedure: ESOPHAGEAL MANOMETRY (EM);  Surgeon: Ruffin FrederickSteven Paul Armbruster, MD;  Location: [facility];  Service: Gastroenterology;  Laterality: N/A;  . EXCISION OF SKIN TAG  10/03/2015   Procedure: EXCISION OF SKIN TAG;  Surgeon: De BlanchLuke Aaron Kinsinger, MD;  Location: [facility] OR;  Service: General;;  . LUMBAR DISC SURGERY  01-31-14   per Dr. Wynetta Emeryram   . OOPHORECTOMY     left overy  . OVARIAN CYST REMOVAL    . RECTOCELE REPAIR    .  SINUSOTOMY  12/14/06  . spinal fusion  2016/10  . TONSILLECTOMY    . TONSILLECTOMY    . VAGINAL HYSTERECTOMY    . WRIST GANGLION EXCISION Left     SOCIAL HISTORY: Social History   Socioeconomic History  . Marital status: Married    Spouse name: Not on file  . Number of children: 2  . Years of education: Not on file  . Highest education level: Not on file  Occupational History  . Occupation: housewife  Social Needs  . Financial resource strain: Not on file  . Food insecurity:    Worry: Not on file    Inability: Not on file  . Transportation needs:    Medical: Not on file    Non-medical: Not on file  Tobacco Use  . Smoking  status: Former Smoker    Types: Cigarettes    Last attempt to quit: 1999    Years since quitting: 21.1  . Smokeless tobacco: Never Used  Substance and Sexual Activity  . Alcohol use: No    Alcohol/week: 0.0 standard drinks  . Drug use: No  . Sexual activity: Yes    Birth control/protection: Surgical  Lifestyle  . Physical activity:    Days per week: Not on file    Minutes per session: Not on file  . Stress: Not on file  Relationships  . Social connections:    Talks on phone: Not on file    Gets together: Not on file    Attends religious service: Not on file    Active member of club or organization: Not on file    Attends meetings of clubs or organizations: Not on file    Relationship status: Not on file  . Intimate partner violence:    Fear of current or ex partner: Not on file    Emotionally abused: Not on file    Physically abused: Not on file    Forced sexual activity: Not on file  Other Topics Concern  . Not on file  Social History Narrative  . Not on file    FAMILY HISTORY: Family History  Problem Relation Age of Onset  . Fibromyalgia Mother   . Diabetes Mother   . Thyroid cancer Mother   . Liver disease Mother   . Neuropathy Mother   . Cirrhosis Mother        non-alcoholic  . Pulmonary embolism Mother        both lungs    . Hypertension Father   . Diabetes Father   . Heart disease Father   . Prostate cancer Father   . Heart attack Father        x 2  . Colon cancer Neg Hx   . Other Neg Hx        pheochromocytoma  . Colon polyps Neg Hx     ALLERGIES:  is allergic to gadolinium derivatives; other; and zoloft [sertraline hcl].  MEDICATIONS:  Current Outpatient Medications  Medication Sig Dispense Refill  . ALPRAZolam (XANAX) 0.5 MG tablet TAKE 1 TABLET BY MOUTH THREE TIMES A DAY AS NEEDED 90 tablet 5  . amLODipine (NORVASC) 5 MG tablet Take 1 tablet (5 mg total) by mouth daily as needed (BP >180/100). 10 tablet 0  . calcium carbonate (TUMS - DOSED IN MG ELEMENTAL CALCIUM) 500 MG chewable tablet Chew 1 tablet by mouth 2 (two) times daily as needed for indigestion or heartburn.    . cetirizine (ZYRTEC) 10 MG tablet Take 10 mg by mouth daily.    . cyanocobalamin (,VITAMIN B-12,) 1000 MCG/ML injection INJECT 1 ML (1,000 MCG TOTAL) INTO THE MUSCLE EVERY 7 DAYS. 4 mL 8  . cyclobenzaprine (FLEXERIL) 10 MG tablet Take 10 mg by mouth 3 (three) times daily as needed for muscle spasms.    Marland Kitchen. docusate sodium (COLACE) 100 MG capsule Take 100 mg by mouth at bedtime.    . fluticasone (FLONASE) 50 MCG/ACT nasal spray Place 2 sprays into both nostrils every evening.     . gabapentin (NEURONTIN) 300 MG capsule Take 1 capsule (300 mg total) by mouth 3 (three) times daily. 90 capsule 3  . hydrochlorothiazide (HYDRODIURIL) 25 MG tablet TAKE 1 TABLET BY MOUTH EVERY DAY 30 tablet 2  . HYDROmorphone (DILAUDID) 2 MG tablet Take 0.5 tablets (1 mg total) by mouth  every 6 (six) hours as needed for severe pain. 30 tablet 0  . Multiple Vitamins-Minerals (MULTI-VITAMIN GUMMIES PO) Take 2 tablets by mouth daily.     . nebivolol (BYSTOLIC) 5 MG tablet Take 1 tablet (5 mg total) by mouth daily. 90 tablet 3  . nitrofurantoin, macrocrystal-monohydrate, (MACROBID) 100 MG capsule Take 100 mg by mouth daily as needed (for uti prophylaxis, after  intercourse).   2  . [START ON 08/09/2018] oxyCODONE (ROXICODONE) 15 MG immediate release tablet Take 1 tablet (15 mg total) by mouth every 4 (four) hours as needed for up to 30 days for pain. 180 tablet 0  . pantoprazole (PROTONIX) 40 MG tablet Take 1 tablet (40 mg total) by mouth daily.    . potassium chloride (KLOR-CON 10) 10 MEQ tablet Take 1 tablet (10 mEq total) by mouth daily. 90 tablet 3  . promethazine (PHENERGAN) 25 MG tablet TAKE 1 TABLET BY MOUTH EVERY 6 HOURS AS NEEDED FOR NAUSEA 90 tablet 1  . rivaroxaban (XARELTO) 20 MG TABS tablet Take 1 tablet (20 mg total) by mouth daily with supper. 30 tablet 0  . simethicone (MYLICON) 80 MG chewable tablet Chew 160 mg by mouth every 6 (six) hours as needed (gas pain).    . SUMAtriptan (IMITREX) 100 MG tablet Take 1 tablet earliest onset of migraine.  May repeat once in 2 hours if headache persists or recurs. 10 tablet 2  . SYRINGE-NEEDLE, DISP, 3 ML (B-D 3CC LUER-LOK SYR 25GX1") 25G X 1" 3 ML MISC USE AS DIRECTED 9 each 11  . Vitamin D, Ergocalciferol, (DRISDOL) 1.25 MG (50000 UT) CAPS capsule TAKE 1 CAPSULE (50,000 UNITS TOTAL) BY MOUTH EVERY 7 (SEVEN) DAYS. 12 capsule 3   No current facility-administered medications for this visit.     REVIEW OF SYSTEMS:    10 Point review of Systems was done is negative except as noted above.  PHYSICAL EXAMINATION:  . Vitals:   07/25/18 1318  BP: 140/86  Pulse: (!) 114  Resp: 18  Temp: 99 F (37.2 C)  SpO2: 97%   Filed Weights   07/25/18 1318  Weight: 282 lb 12.8 oz (128.3 kg)   .Body mass index is 41.76 kg/m.  GENERAL:alert, in no acute distress and comfortable SKIN: no acute rashes, no significant lesions EYES: conjunctiva are pink and non-injected, sclera anicteric OROPHARYNX: MMM, no exudates, no oropharyngeal erythema or ulceration NECK: supple, no JVD LYMPH:  no palpable lymphadenopathy in the cervical, axillary or inguinal regions LUNGS: clear to auscultation b/l with normal  respiratory effort HEART: regular rate & rhythm ABDOMEN:  normoactive bowel sounds , non tender, not distended. Extremity: no pedal edema PSYCH: alert & oriented x 3 with fluent speech NEURO: no focal motor/sensory deficits  LABORATORY DATA:  I have reviewed the data as listed  . CBC Latest Ref Rng & Units 06/22/2018 06/21/2018 06/19/2018  WBC 4.0 - 10.5 K/uL 8.6 11.6(H) 7.2  Hemoglobin 12.0 - 15.0 g/dL 16.1 09.6 11.2(L)  Hematocrit 36.0 - 46.0 % 39.2 45.0 35.0(L)  Platelets 150 - 400 K/uL 411(H) 436(H) 260    . CMP Latest Ref Rng & Units 06/22/2018 06/21/2018 06/19/2018  Glucose 70 - 99 mg/dL 045(W) 098(J) 191(Y)  BUN 6 - 20 mg/dL 8 9 10   Creatinine 0.44 - 1.00 mg/dL 7.82 9.56 2.13  Sodium 135 - 145 mmol/L 140 139 142  Potassium 3.5 - 5.1 mmol/L 4.0 3.6 3.4(L)  Chloride 98 - 111 mmol/L 106 101 109  CO2 22 - 32 mmol/L  25 28 25   Calcium 8.9 - 10.3 mg/dL 2.3(F) 9.1 5.7(D)  Total Protein 6.5 - 8.1 g/dL - 7.3 2.2(G)  Total Bilirubin 0.3 - 1.2 mg/dL - 0.6 2.5(K)  Alkaline Phos 38 - 126 U/L - 57 46  AST 15 - 41 U/L - 19 13(L)  ALT 0 - 44 U/L - 14 13     RADIOGRAPHIC STUDIES: I have personally reviewed the radiological images as listed and agreed with the findings in the report. No results found.  ASSESSMENT & PLAN:   43 y.o. female with  1. History of Pulmonary Embolism PLAN -Discussed patient's most recent labs from 06/22/18, PLT borderline high at 411k, other blood counts are normal -Discussed the 06/06/18 CTA Chest which revealed Foci of incompletely obstructing pulmonary embolus in lower lobe pulmonary artery branches on the right. No more central pulmonary embolus evident. No thoracic aortic aneurysm or dissection. No right heart strain. 2.  Mild bibasilar atelectasis.  No lung edema or consolidation. 3.  No appreciable thoracic adenopathy. 4.  Hepatic steatosis. -Discussed the 06/07/18 US Venous BLE which revealed Right: There is no evidence of deep vein thrombosis in the lower  extremity. No cystic structure found in the popliteal fossa. Left: There is no evidence of deep vein thrombosis in the lower extremity. No cystic structure found in the popliteal fossa -Discussed that her surgery and lack of mobility following surgery is likely the provoking event of her blood clot unless proven otherwise -Discussed that an inherited clotting disorder quite unlikely given the absence of previous clots through at least 5 previous surgeries, two pregnancies and deliveries, and 6 months of PO hormonal contraception -Recommend 6 months total of Xarelto followed by 6 months of 81mg  aspirin, unless blood work drawn today to r/o additional hypercoagulability factors reveals an additional concern -Discussed that Lupus anticoagulant can often reflect a false positive in the presence of taking a blood thinner -Recommend continuing follow up with PT -Recommend work up for sleep apnea as coordinated with her PCP -Will order labs today -  -Will see the pt back as needed based on labs drawn today   Labs today RTC with Dr Candise Che as needed   All of the patients questions were answered with apparent satisfaction. The patient knows to call the clinic with any problems, questions or concerns.  The total time spent in the appt was 45 minutes and more than 50% was on counseling and direct patient cares.    Wyvonnia Lora MD MS AAHIVMS [facility] Hematology/Oncology Physician [facility]  (Office):       [phone] (Work cell):  [phone] (Fax):           [phone]  07/25/2018 2:05 PM  I, Marcelline Mates, am acting as a scribe for Dr. Wyvonnia Lora.   .I have reviewed the above documentation for accuracy and completeness, and I agree with the above. Johney Maine MD

## 2018-07-23 LAB — CATECHOLAMINES, FRACTIONATED, URINE, 24 HOUR
Dopamine , 24H Ur: 346 ug/24 hr (ref 0–510)
Dopamine, Rand Ur: 173 ug/L
Epinephrine, 24H Ur: <2 ug/24 hr (ref 0–20)
Epinephrine, Rand Ur: <1 ug/L
Norepinephrine, 24H Ur: 50 ug/24 hr (ref 0–135)
Norepinephrine, Rand Ur: 25 ug/L

## 2018-07-25 ENCOUNTER — Telehealth: Payer: Self-pay | Admitting: Hematology

## 2018-07-25 ENCOUNTER — Inpatient Hospital Stay: Payer: BLUE CROSS/BLUE SHIELD

## 2018-07-25 ENCOUNTER — Inpatient Hospital Stay: Payer: BLUE CROSS/BLUE SHIELD | Attending: Hematology | Admitting: Hematology

## 2018-07-25 VITALS — BP 140/86 | HR 114 | Temp 99.0°F | Resp 18 | Ht 69.0 in | Wt 282.8 lb

## 2018-07-25 DIAGNOSIS — Z86711 Personal history of pulmonary embolism: Secondary | ICD-10-CM | POA: Diagnosis not present

## 2018-07-25 DIAGNOSIS — I2699 Other pulmonary embolism without acute cor pulmonale: Secondary | ICD-10-CM

## 2018-07-25 DIAGNOSIS — D6859 Other primary thrombophilia: Secondary | ICD-10-CM

## 2018-07-25 LAB — CBC WITH DIFFERENTIAL/PLATELET
Abs Immature Granulocytes: 0.04 10*3/uL (ref 0.00–0.07)
Basophils Absolute: 0.1 10*3/uL (ref 0.0–0.1)
Basophils Relative: 1 %
Eosinophils Absolute: 0.2 10*3/uL (ref 0.0–0.5)
Eosinophils Relative: 2 %
HCT: 43 % (ref 36.0–46.0)
Hemoglobin: 14.1 g/dL (ref 12.0–15.0)
Immature Granulocytes: 0 %
Lymphocytes Relative: 32 %
Lymphs Abs: 3.2 10*3/uL (ref 0.7–4.0)
MCH: 28 pg (ref 26.0–34.0)
MCHC: 32.8 g/dL (ref 30.0–36.0)
MCV: 85.5 fL (ref 80.0–100.0)
Monocytes Absolute: 0.6 10*3/uL (ref 0.1–1.0)
Monocytes Relative: 6 %
Neutro Abs: 5.9 10*3/uL (ref 1.7–7.7)
Neutrophils Relative %: 59 %
Platelets: 354 10*3/uL (ref 150–400)
RBC: 5.03 MIL/uL (ref 3.87–5.11)
RDW: 12.7 % (ref 11.5–15.5)
WBC: 10 10*3/uL (ref 4.0–10.5)
nRBC: 0 % (ref 0.0–0.2)

## 2018-07-25 NOTE — Telephone Encounter (Signed)
Per 2/24 los Labs today  RTC with Dr Candise Che as needed.  Printed avs.

## 2018-07-26 ENCOUNTER — Telehealth: Payer: Self-pay | Admitting: *Deleted

## 2018-07-26 LAB — CARDIOLIPIN ANTIBODIES, IGG, IGM, IGA
Anticardiolipin IgA: <9 APL U/mL (ref 0–11)
Anticardiolipin IgG: <9 GPL U/mL (ref 0–14)
Anticardiolipin IgM: <9 MPL U/mL (ref 0–12)

## 2018-07-26 NOTE — Telephone Encounter (Signed)
Patient had question after leaving appt with Dr. Candise Che yesterday regarding travel. Msg/question left with lobby receptionist: Is it ok to travel by car or plane due to clotting? Question given to Dr. Candise Che today. Per Dr. Candise Che, please tell patient: "Being on Xarelto currently would not be a risk traveling by car or plane. If there is any shortness of breath at rest, it would be advisable to do a 6 min walk test with PCP to rule out low oxygen levels with ambulation that might factor into flying."  Attempted to contact patient - left general voice mail on un-named voice mail to contact office @ [phone]

## 2018-07-27 ENCOUNTER — Other Ambulatory Visit: Payer: Self-pay | Admitting: Gastroenterology

## 2018-07-27 LAB — DRVVT CONFIRM: dRVVT Confirm: 1.7 ratio — ABNORMAL HIGH (ref 0.8–1.2)

## 2018-07-27 LAB — LUPUS ANTICOAGULANT PANEL
DRVVT: 86.4 s — ABNORMAL HIGH (ref 0.0–47.0)
PTT Lupus Anticoagulant: 43.9 s (ref 0.0–51.9)

## 2018-07-27 LAB — BETA-2-GLYCOPROTEIN I ABS, IGG/M/A
Beta-2 Glyco I IgG: <9 GPI IgG units (ref 0–20)
Beta-2-Glycoprotein I IgA: <9 GPI IgA units (ref 0–25)
Beta-2-Glycoprotein I IgM: <9 GPI IgM units (ref 0–32)

## 2018-07-27 LAB — DRVVT MIX: dRVVT Mix: 58.2 s — ABNORMAL HIGH (ref 0.0–47.0)

## 2018-07-29 LAB — PROTHROMBIN GENE MUTATION

## 2018-07-31 ENCOUNTER — Other Ambulatory Visit: Payer: Self-pay | Admitting: Family Medicine

## 2018-08-02 LAB — FACTOR 5 LEIDEN

## 2018-08-02 NOTE — Telephone Encounter (Signed)
Dr. Fry please advise on refill of medication.  Thanks  

## 2018-08-03 ENCOUNTER — Encounter: Payer: Self-pay | Admitting: Family Medicine

## 2018-08-03 ENCOUNTER — Encounter: Payer: Self-pay | Admitting: Hematology

## 2018-08-03 ENCOUNTER — Other Ambulatory Visit: Payer: Self-pay | Admitting: Family Medicine

## 2018-08-03 ENCOUNTER — Telehealth: Payer: Self-pay | Admitting: Hematology

## 2018-08-03 ENCOUNTER — Telehealth: Payer: Self-pay | Admitting: *Deleted

## 2018-08-03 NOTE — Telephone Encounter (Signed)
Scheduled appt per 3/4 sch message - pt is aware of apt date and time   

## 2018-08-03 NOTE — Telephone Encounter (Signed)
Patient sent My Chart message:  "I have a question about BETA-2-GLYCOPROTEIN I ABS, IGG/M/A resulted on 07/27/18, 6:36 AM.  I don't understand your notes. Is this what you were talking about that could be falsely positive? Or is it something else? I can't quite tell if something is wrong in my blood." Dr. Candise Che response: Please advise patient that all labs were negative for clotting disorder. The lupus anticoagulant test was positive - likely false positive as it is affected by Xarelto. If patient wants to f/u - he recommends RTC in 3 months, with labs a week prior to visit. Patient will need to hold Xarelto for 3 days prior to labs. Patient verbalized understanding and is in agreement regarding appt.  Message sent to scheduling.   Patient stated she had not received previous message on 2/25 due to changing phones. Repeated information regarding travel by car or plane:  07/26/2018 Msg/question left with lobby receptionist: Is it ok to travel by car or plane due to clotting? Question given to Dr. Candise Che today. Per Dr. Candise Che, please tell patient: "Being on Xarelto currently would not be a risk traveling by car or plane. If there is any shortness of breath at rest, it would be advisable to do a 6 min walk test with PCP to rule out low oxygen levels with ambulation that might factor into flying."  Patient verbalized understanding.

## 2018-08-04 ENCOUNTER — Other Ambulatory Visit: Payer: Self-pay | Admitting: Gastroenterology

## 2018-08-04 ENCOUNTER — Other Ambulatory Visit: Payer: Self-pay | Admitting: *Deleted

## 2018-08-04 DIAGNOSIS — I2699 Other pulmonary embolism without acute cor pulmonale: Secondary | ICD-10-CM

## 2018-08-09 ENCOUNTER — Telehealth: Payer: Self-pay | Admitting: Family Medicine

## 2018-08-09 NOTE — Telephone Encounter (Signed)
Copied from CRM [id number]. Topic: Quick Communication - See Telephone Encounter >> Aug 09, 2018  1:25 PM Terisa Starr wrote: CRM for notification. See Telephone encounter for: 08/09/18.  [facility] [address] [facility] in [facility] oxyCODONE (ROXICODONE) 15 MG immediate release tablet and she said that [facility] [address] [facility]. She would like to know could Dr Clent Ridges send a new script there?  [id number] [place], [place] - [address] [place] [address] Phone: [phone] Fax: [phone]

## 2018-08-09 NOTE — Telephone Encounter (Signed)
Dr. Fry please advise. Thanks  

## 2018-08-10 DIAGNOSIS — R293 Abnormal posture: Secondary | ICD-10-CM | POA: Diagnosis not present

## 2018-08-10 DIAGNOSIS — M5136 Other intervertebral disc degeneration, lumbar region: Secondary | ICD-10-CM | POA: Diagnosis not present

## 2018-08-10 DIAGNOSIS — M545 Low back pain: Secondary | ICD-10-CM | POA: Diagnosis not present

## 2018-08-10 DIAGNOSIS — R262 Difficulty in walking, not elsewhere classified: Secondary | ICD-10-CM | POA: Diagnosis not present

## 2018-08-10 MED ORDER — OXYCODONE HCL 15 MG PO TABS: 15.0000 mg | ORAL_TABLET | ORAL | 0 refills | Status: DC | PRN

## 2018-08-10 NOTE — Telephone Encounter (Signed)
I sent [facility] to [facility] [place], please cancel the order at [facility]

## 2018-08-11 NOTE — Telephone Encounter (Signed)
[facility]ed [facility] [address] and did cancel the refill of the oxycodone since this was called to another pharmacy.

## 2018-08-15 ENCOUNTER — Ambulatory Visit: Payer: BLUE CROSS/BLUE SHIELD | Admitting: Internal Medicine

## 2018-08-16 ENCOUNTER — Telehealth: Payer: Self-pay | Admitting: *Deleted

## 2018-08-16 ENCOUNTER — Other Ambulatory Visit: Payer: Self-pay | Admitting: Family Medicine

## 2018-08-16 NOTE — Telephone Encounter (Signed)
Prior auth for Oxycodone 15mg  sent to [email or web address] [id number].  Message forwarded to [facility] for follow up.

## 2018-08-17 ENCOUNTER — Other Ambulatory Visit: Payer: Self-pay | Admitting: Gastroenterology

## 2018-08-19 DIAGNOSIS — M5136 Other intervertebral disc degeneration, lumbar region: Secondary | ICD-10-CM | POA: Diagnosis not present

## 2018-08-19 DIAGNOSIS — R262 Difficulty in walking, not elsewhere classified: Secondary | ICD-10-CM | POA: Diagnosis not present

## 2018-08-19 DIAGNOSIS — M545 Low back pain: Secondary | ICD-10-CM | POA: Diagnosis not present

## 2018-08-19 DIAGNOSIS — R293 Abnormal posture: Secondary | ICD-10-CM | POA: Diagnosis not present

## 2018-08-24 NOTE — Telephone Encounter (Signed)
PA has been approved

## 2018-08-25 DIAGNOSIS — M5440 Lumbago with sciatica, unspecified side: Secondary | ICD-10-CM | POA: Diagnosis not present

## 2018-08-25 DIAGNOSIS — M5136 Other intervertebral disc degeneration, lumbar region: Secondary | ICD-10-CM | POA: Diagnosis not present

## 2018-08-26 ENCOUNTER — Ambulatory Visit: Payer: Self-pay

## 2018-08-26 ENCOUNTER — Ambulatory Visit (INDEPENDENT_AMBULATORY_CARE_PROVIDER_SITE_OTHER): Payer: BLUE CROSS/BLUE SHIELD | Admitting: Family Medicine

## 2018-08-26 ENCOUNTER — Encounter: Payer: Self-pay | Admitting: Family Medicine

## 2018-08-26 DIAGNOSIS — R1011 Right upper quadrant pain: Secondary | ICD-10-CM | POA: Diagnosis not present

## 2018-08-26 DIAGNOSIS — L659 Nonscarring hair loss, unspecified: Secondary | ICD-10-CM | POA: Diagnosis not present

## 2018-08-26 NOTE — Telephone Encounter (Signed)
Pt called stating that on her right side just below her gallbladder scar she has a form area that itches.  She give Hx of cholecystectomy 2 years ago.  She say that she has been taking blood thinners since January because of pulmonary embolism after spinal fusion. She states the area is firm and painful to touch.  She sees no evidence of reddness.  No evident bug bite or anything to explain her symptoms. Appointment scheduled per protocol.  Pt will wait for instructions from office. Email address confirmed.  Care advice read to patient. Pt verbalized understanding of all instructions.  Reason for Disposition . Looks infected (redness, red streak, pus)  Answer Assessment - Initial Assessment Questions 1. DESCRIPTION: "Describe the itching you are having." "Where is it located?"     On scar from 2 years ago Colecyesctomy 2. SEVERITY: "How bad is it?"    - MILD - doesn't interfere with normal activities   - MODERATE - SEVERE: interferes with work, school, sleep, or other activities      milds sore to touch 3. SCRATCHING: "Are there any scratch marks? Bleeding?"     none 4. ONSET: "When did the itching begin?"      yesterday 5. CAUSE: "What do you think is causing the itching?"      unsure 6. OTHER SYMPTOMS: "Do you have any other symptoms?"      Belching, acid reflux 7. PREGNANCY: "Is there any chance you are pregnant?" "When was your last menstrual period?"     No hysterectomy 2001  Protocols used: ITCHING - LOCALIZED-A-AH

## 2018-08-26 NOTE — Telephone Encounter (Signed)
Noted  

## 2018-08-26 NOTE — Progress Notes (Signed)
Subjective:    Patient ID: [name], female    DOB: 1975-12-22, 43 y.o.   MRN: [id number]  HPI     Virtual Visit via Video Note  I connected with the patient on 08/26/18 at  1:15 PM EDT by a video enabled telemedicine application and verified that I am speaking with the correct person using two identifiers.  Location patient: home Location provider:work or home office Persons participating in the virtual visit: patient, provider  I discussed the limitations of evaluation and management by telemedicine and the availability of in person appointments. The patient expressed understanding and agreed to proceed.   HPI: See my HPI below    ROS: See pertinent positives and negatives per HPI.  Past Medical History:  Diagnosis Date  . Allergy   . Anxiety   . Arthritis   . B12 deficiency   . Chronic narcotic use   . Depression    not currently   . Dysrhythmia    hx tachy-was from medication nortriptylline  . Esophagitis    rx  . Fibromyalgia   . GERD (gastroesophageal reflux disease)   . History of echocardiogram    Echo 4/18: Mild concentric LVH, vigorous LVF, EF 65-70, normal wall motion, grade 1 diastolic dysfunction  . History of kidney stones   . Hyperlipidemia   . Intervertebral disc protrusion 01/11/2014  . Migraine   . Obesity (BMI 35.0-39.9 without comorbidity)   . Peripheral neuropathy   . Polycystic ovaries   . Pulmonary embolus (HCC)   . Spinal stenosis, lumbar   . Vitamin D deficiency     Past Surgical History:  Procedure Laterality Date  . 24 HOUR PH STUDY N/A 05/27/2016   Procedure: 24 HOUR PH STUDY;  Surgeon: Ruffin Frederick, MD;  Location: [facility];  Service: Gastroenterology;  Laterality: N/A;  . ABDOMINAL EXPOSURE N/A 05/04/2018   Procedure: ABDOMINAL EXPOSURE;  Surgeon: Larina Earthly, MD;  Location: [facility] OR;  Service: Vascular;  Laterality: N/A;  . ANTERIOR LUMBAR FUSION N/A 05/04/2018   Procedure: Lumbar Five Sacral One Anterior  lumbar interbody fusion;  Surgeon: Donalee Citrin, MD;  Location: [facility] OR;  Service: Neurosurgery;  Laterality: N/A;  Lumbar Five Sacral One Anterior lumbar interbody fusion  . APPENDECTOMY    . BACK SURGERY  2012   left laminectomy at L3-4 per Dr. Phoebe Perch   . bladder tack  2012  . CHOLECYSTECTOMY N/A 10/03/2015   Procedure: LAPAROSCOPIC CHOLECYSTECTOMY;  Surgeon: Rodman Pickle, MD;  Location: [facility] OR;  Service: General;  Laterality: N/A;  . ESOPHAGEAL MANOMETRY N/A 05/27/2016   Procedure: ESOPHAGEAL MANOMETRY (EM);  Surgeon: Ruffin Frederick, MD;  Location: [facility];  Service: Gastroenterology;  Laterality: N/A;  . EXCISION OF SKIN TAG  10/03/2015   Procedure: EXCISION OF SKIN TAG;  Surgeon: De Blanch Kinsinger, MD;  Location: [facility] OR;  Service: General;;  . LUMBAR DISC SURGERY  01-31-14   per Dr. [name]   . OOPHORECTOMY     left overy  . OVARIAN CYST REMOVAL    . RECTOCELE REPAIR    . SINUSOTOMY  12/14/06  . spinal fusion  2016/10  . TONSILLECTOMY    . TONSILLECTOMY    . VAGINAL HYSTERECTOMY    . WRIST GANGLION EXCISION Left     Family History  Problem Relation Age of Onset  . Fibromyalgia Mother   . Diabetes Mother   . Thyroid cancer Mother   . Liver disease Mother   . Neuropathy  Mother   . Cirrhosis Mother        non-alcoholic  . Pulmonary embolism Mother        both lungs  . Hypertension Father   . Diabetes Father   . Heart disease Father   . Prostate cancer Father   . Heart attack Father        x 2  . Colon cancer Neg Hx   . Other Neg Hx        pheochromocytoma  . Colon polyps Neg Hx     SOCIAL HX:    Current Outpatient Medications:  .  ALPRAZolam (XANAX) 0.5 MG tablet, TAKE 1 TABLET BY MOUTH THREE TIMES A DAY AS NEEDED, Disp: 90 tablet, Rfl: 5 .  amLODipine (NORVASC) 5 MG tablet, Take 1 tablet (5 mg total) by mouth daily as needed (BP >180/100)., Disp: 10 tablet, Rfl: 0 .  calcium carbonate (TUMS - DOSED IN MG ELEMENTAL CALCIUM) 500 MG chewable tablet, Chew  1 tablet by mouth 2 (two) times daily as needed for indigestion or heartburn., Disp: , Rfl:  .  cetirizine (ZYRTEC) 10 MG tablet, Take 10 mg by mouth daily., Disp: , Rfl:  .  cyanocobalamin (,VITAMIN B-12,) 1000 MCG/ML injection, INJECT 1 ML (1,000 MCG TOTAL) INTO THE MUSCLE EVERY 7 DAYS., Disp: 4 mL, Rfl: 8 .  cyclobenzaprine (FLEXERIL) 10 MG tablet, Take 10 mg by mouth 3 (three) times daily as needed for muscle spasms., Disp: , Rfl:  .  docusate sodium (COLACE) 100 MG capsule, Take 100 mg by mouth at bedtime., Disp: , Rfl:  .  fluconazole (DIFLUCAN) 150 MG tablet, TAKE 1 TABLET (150 MG TOTAL) BY MOUTH DAILY., Disp: 5 tablet, Rfl: 5 .  fluticasone (FLONASE) 50 MCG/ACT nasal spray, Place 2 sprays into both nostrils every evening. , Disp: , Rfl:  .  gabapentin (NEURONTIN) 300 MG capsule, TAKE 1 CAPSULE BY MOUTH THREE TIMES A DAY, Disp: 90 capsule, Rfl: 3 .  hydrochlorothiazide (HYDRODIURIL) 25 MG tablet, TAKE 1 TABLET BY MOUTH EVERY DAY, Disp: 30 tablet, Rfl: 5 .  HYDROmorphone (DILAUDID) 2 MG tablet, Take 0.5 tablets (1 mg total) by mouth every 6 (six) hours as needed for severe pain., Disp: 30 tablet, Rfl: 0 .  Multiple Vitamins-Minerals (MULTI-VITAMIN GUMMIES PO), Take 2 tablets by mouth daily. , Disp: , Rfl:  .  nebivolol (BYSTOLIC) 5 MG tablet, Take 1 tablet (5 mg total) by mouth daily., Disp: 90 tablet, Rfl: 3 .  nitrofurantoin, macrocrystal-monohydrate, (MACROBID) 100 MG capsule, Take 100 mg by mouth daily as needed (for uti prophylaxis, after intercourse). , Disp: , Rfl: 2 .  oxyCODONE (ROXICODONE) 15 MG immediate release tablet, Take 1 tablet (15 mg total) by mouth every 4 (four) hours as needed for up to 30 days for pain., Disp: 180 tablet, Rfl: 0 .  pantoprazole (PROTONIX) 40 MG tablet, TAKE 1 TABLET BY MOUTH TWICE A DAY **NEED PA FOR BID**, Disp: 180 tablet, Rfl: 0 .  potassium chloride (KLOR-CON 10) 10 MEQ tablet, Take 1 tablet (10 mEq total) by mouth daily., Disp: 90 tablet, Rfl: 3 .   promethazine (PHENERGAN) 25 MG tablet, TAKE 1 TABLET BY MOUTH EVERY 6 HOURS AS NEEDED FOR NAUSEA, Disp: 90 tablet, Rfl: 1 .  simethicone (MYLICON) 80 MG chewable tablet, Chew 160 mg by mouth every 6 (six) hours as needed (gas pain)., Disp: , Rfl:  .  SUMAtriptan (IMITREX) 100 MG tablet, Take 1 tablet earliest onset of migraine.  May repeat once in 2  hours if headache persists or recurs., Disp: 10 tablet, Rfl: 2 .  SYRINGE-NEEDLE, DISP, 3 ML (B-D 3CC LUER-LOK SYR 25GX1") 25G X 1" 3 ML MISC, USE AS DIRECTED, Disp: 9 each, Rfl: 11 .  Vitamin D, Ergocalciferol, (DRISDOL) 1.25 MG (50000 UT) CAPS capsule, TAKE 1 CAPSULE (50,000 UNITS TOTAL) BY MOUTH EVERY 7 (SEVEN) DAYS., Disp: 12 capsule, Rfl: 3 .  XARELTO 20 MG TABS tablet, TAKE 1 TABLET (20 MG TOTAL) BY MOUTH DAILY WITH SUPPER. **P/A DENIED**, Disp: 30 tablet, Rfl: 3  EXAM:  VITALS per patient if applicable:  GENERAL: alert, oriented, appears well and in no acute distress  HEENT: atraumatic, conjunttiva clear, no obvious abnormalities on inspection of external nose and ears  NECK: normal movements of the head and neck  LUNGS: on inspection no signs of respiratory distress, breathing rate appears normal, no obvious gross SOB, gasping or wheezing  CV: no obvious cyanosis  MS: moves all visible extremities without noticeable abnormality  PSYCH/NEURO: pleasant and cooperative, no obvious depression or anxiety, speech and thought processing grossly intact  ASSESSMENT AND PLAN:  Discussed the following assessment and plan:  No diagnosis found.     I discussed the assessment and treatment plan with the patient. The patient was provided an opportunity to ask questions and all were answered. The patient agreed with the plan and demonstrated an understanding of the instructions.   The patient was advised to call back or seek an in-person evaluation if the symptoms worsen or if the condition fails to improve as anticipated.  I provided 22  minutes of non-face-to-face time during this encounter. Here for several issues. First yesterday she had an episode of RUQ and epigastric pain after eating a meal. No nausea or fever. Her BMs have been regular. She has a hx of GERD and she used to take Protonix bid. However since her insurance refused to cover this, Dr. Adela Lank reduced this to once a day. Also she has noticed her hair is thinning dramatically for the past month and she finds it in the shower every day. No recent medication changes. She takes a multivitamin daily.    Review of Systems     Objective:   Physical Exam        Assessment & Plan:  She likely has some duodenitis. She will continue to take Protonix in the mornings, and we will add Pepcid 40 mg every day in the evenings. As for the hair loss, she will get labs next week to check her thyroid levels, etc.

## 2018-08-30 ENCOUNTER — Other Ambulatory Visit: Payer: Self-pay

## 2018-08-30 ENCOUNTER — Telehealth (INDEPENDENT_AMBULATORY_CARE_PROVIDER_SITE_OTHER): Payer: BLUE CROSS/BLUE SHIELD | Admitting: Internal Medicine

## 2018-08-30 VITALS — BP 140/87 | HR 107 | Temp 98.1°F | Ht 69.0 in | Wt 278.0 lb

## 2018-08-30 DIAGNOSIS — R000 Tachycardia, unspecified: Secondary | ICD-10-CM

## 2018-08-30 MED ORDER — AMLODIPINE BESYLATE 5 MG PO TABS: 5.0000 mg | ORAL_TABLET | Freq: Every day | ORAL | 3 refills | Status: DC

## 2018-08-30 NOTE — Addendum Note (Signed)
Addended by: Oretha Milch on: 08/30/2018 04:56 PM   Modules accepted: Orders

## 2018-08-30 NOTE — Progress Notes (Signed)
Electrophysiology TeleHealth Note   Due to national recommendations of social distancing due to COVID 19, an audio/video telehealth visit is felt to be most appropriate for this patient at this time.  See MyChart message from today for the patient's consent to telehealth for [facility].   Date:  08/30/2018   ID:  [name], DOB Jan 01, 1976, MRN [id number]  Location: patient's home  Provider location: [address], [place]  Evaluation Performed: Follow-up visit  PCP:  [name], MD  Cardiologist:   Electrophysiologist:  sk   Chief Complaint: tachycardia*and orthostatic hypertension  History of Present Illness:    [name] is a 43 y.o. female who presents via audio/video conferencing for a telehealth visit today.  Since last being seen in our clinic, the patient reports doing hanging in there  She is extremely anxious re covid 19 esp as her husband has been working out of town and is coming home in a few weeks  Significant alopecia, had previously with atenolol  Did not tolerate dilt in the past  Has some problems with constipation with pain med  Exercise tolerance is very limited by dyspnea   HR has been much better of late on nebivolol with resting in the 80s   The patient denies symptoms of fevers, chills, cough, or new SOB worrisome for COVID 19.    Past Medical History:  Diagnosis Date  . Allergy   . Anxiety   . Arthritis   . B12 deficiency   . Chronic narcotic use   . Depression    not currently   . Dysrhythmia    hx tachy-was from medication nortriptylline  . Esophagitis    rx  . Fibromyalgia   . GERD (gastroesophageal reflux disease)   . History of echocardiogram    Echo 4/18: Mild concentric LVH, vigorous LVF, EF 65-70, normal wall motion, grade 1 diastolic dysfunction  . History of kidney stones   . Hyperlipidemia   . Intervertebral disc protrusion 01/11/2014  . Migraine   . Obesity (BMI 35.0-39.9 without  comorbidity)   . Peripheral neuropathy   . Polycystic ovaries   . Pulmonary embolus (HCC)   . Spinal stenosis, lumbar   . Vitamin D deficiency     Past Surgical History:  Procedure Laterality Date  . 24 HOUR PH STUDY N/A 05/27/2016   Procedure: 24 HOUR PH STUDY;  Surgeon: Ruffin Frederick, MD;  Location: [facility];  Service: Gastroenterology;  Laterality: N/A;  . ABDOMINAL EXPOSURE N/A 05/04/2018   Procedure: ABDOMINAL EXPOSURE;  Surgeon: Larina Earthly, MD;  Location: [facility] OR;  Service: Vascular;  Laterality: N/A;  . ANTERIOR LUMBAR FUSION N/A 05/04/2018   Procedure: Lumbar Five Sacral One Anterior lumbar interbody fusion;  Surgeon: Donalee Citrin, MD;  Location: [facility] OR;  Service: Neurosurgery;  Laterality: N/A;  Lumbar Five Sacral One Anterior lumbar interbody fusion  . APPENDECTOMY    . BACK SURGERY  2012   left laminectomy at L3-4 per Dr. Phoebe Perch   . bladder tack  2012  . CHOLECYSTECTOMY N/A 10/03/2015   Procedure: LAPAROSCOPIC CHOLECYSTECTOMY;  Surgeon: Rodman Pickle, MD;  Location: [facility] OR;  Service: General;  Laterality: N/A;  . ESOPHAGEAL MANOMETRY N/A 05/27/2016   Procedure: ESOPHAGEAL MANOMETRY (EM);  Surgeon: Ruffin Frederick, MD;  Location: [facility];  Service: Gastroenterology;  Laterality: N/A;  . EXCISION OF SKIN TAG  10/03/2015   Procedure: EXCISION OF SKIN TAG;  Surgeon: De Blanch Kinsinger,  MD;  Location: [facility] OR;  Service: General;;  . LUMBAR DISC SURGERY  01-31-14   per Dr. [name]   . OOPHORECTOMY     left overy  . OVARIAN CYST REMOVAL    . RECTOCELE REPAIR    . SINUSOTOMY  12/14/06  . spinal fusion  2016/10  . TONSILLECTOMY    . TONSILLECTOMY    . VAGINAL HYSTERECTOMY    . WRIST GANGLION EXCISION Left     Current Outpatient Medications  Medication Sig Dispense Refill  . ALPRAZolam (XANAX) 0.5 MG tablet TAKE 1 TABLET BY MOUTH THREE TIMES A DAY AS NEEDED 90 tablet 5  . amLODipine (NORVASC) 5 MG tablet Take 1 tablet (5 mg total) by mouth daily as needed  (BP >180/100). 10 tablet 0  . calcium carbonate (TUMS - DOSED IN MG ELEMENTAL CALCIUM) 500 MG chewable tablet Chew 1 tablet by mouth 2 (two) times daily as needed for indigestion or heartburn.    . cetirizine (ZYRTEC) 10 MG tablet Take 10 mg by mouth daily.    . cyanocobalamin (,VITAMIN B-12,) 1000 MCG/ML injection INJECT 1 ML (1,000 MCG TOTAL) INTO THE MUSCLE EVERY 7 DAYS. 4 mL 8  . cyclobenzaprine (FLEXERIL) 10 MG tablet Take 10 mg by mouth 3 (three) times daily as needed for muscle spasms.    Marland Kitchen docusate sodium (COLACE) 100 MG capsule Take 100 mg by mouth at bedtime.    . fluconazole (DIFLUCAN) 150 MG tablet TAKE 1 TABLET (150 MG TOTAL) BY MOUTH DAILY. 5 tablet 5  . fluticasone (FLONASE) 50 MCG/ACT nasal spray Place 2 sprays into both nostrils every evening.     . gabapentin (NEURONTIN) 300 MG capsule TAKE 1 CAPSULE BY MOUTH THREE TIMES A DAY 90 capsule 3  . hydrochlorothiazide (HYDRODIURIL) 25 MG tablet TAKE 1 TABLET BY MOUTH EVERY DAY 30 tablet 5  . Multiple Vitamins-Minerals (MULTI-VITAMIN GUMMIES PO) Take 2 tablets by mouth daily.     . nebivolol (BYSTOLIC) 5 MG tablet Take 1 tablet (5 mg total) by mouth daily. 90 tablet 3  . nitrofurantoin, macrocrystal-monohydrate, (MACROBID) 100 MG capsule Take 100 mg by mouth daily as needed (for uti prophylaxis, after intercourse).   2  . oxyCODONE (ROXICODONE) 15 MG immediate release tablet Take 1 tablet (15 mg total) by mouth every 4 (four) hours as needed for up to 30 days for pain. 180 tablet 0  . pantoprazole (PROTONIX) 40 MG tablet TAKE 1 TABLET BY MOUTH TWICE A DAY **NEED PA FOR BID** 180 tablet 0  . potassium chloride (KLOR-CON 10) 10 MEQ tablet Take 1 tablet (10 mEq total) by mouth daily. 90 tablet 3  . promethazine (PHENERGAN) 25 MG tablet TAKE 1 TABLET BY MOUTH EVERY 6 HOURS AS NEEDED FOR NAUSEA 90 tablet 1  . simethicone (MYLICON) 80 MG chewable tablet Chew 160 mg by mouth every 6 (six) hours as needed (gas pain).    . SUMAtriptan (IMITREX)  100 MG tablet Take 1 tablet earliest onset of migraine.  May repeat once in 2 hours if headache persists or recurs. 10 tablet 2  . SYRINGE-NEEDLE, DISP, 3 ML (B-D 3CC LUER-LOK SYR 25GX1") 25G X 1" 3 ML MISC USE AS DIRECTED 9 each 11  . Vitamin D, Ergocalciferol, (DRISDOL) 1.25 MG (50000 UT) CAPS capsule TAKE 1 CAPSULE (50,000 UNITS TOTAL) BY MOUTH EVERY 7 (SEVEN) DAYS. 12 capsule 3  . XARELTO 20 MG TABS tablet TAKE 1 TABLET (20 MG TOTAL) BY MOUTH DAILY WITH SUPPER. **P/A DENIED** 30 tablet 3  No current facility-administered medications for this visit.     Allergies:   Gadolinium derivatives; Other; and Zoloft [sertraline hcl]   Social History:  The patient  reports that she quit smoking about 21 years ago. Her smoking use included cigarettes. She has never used smokeless tobacco. She reports that she does not drink alcohol or use drugs.   Family History:  The patient's   family history includes Cirrhosis in her mother; Diabetes in her father and mother; Fibromyalgia in her mother; Heart attack in her father; Heart disease in her father; Hypertension in her father; Liver disease in her mother; Neuropathy in her mother; Prostate cancer in her father; Pulmonary embolism in her mother; Thyroid cancer in her mother.   ROS:  Please see the history of present illness.   All other systems are personally reviewed and negative.    Exam:    Vital Signs:  BP 140/87   Pulse (!) 107   Temp 98.1 F (36.7 C)   Ht  (1.753 m)   Wt 278 lb (126.1 kg)   LMP 12/13/1999 Comment: single oophorectomy//a.c.  BMI 41.05 kg/m     Well appearing, alert and conversant, regular work of breathing,  good skin color Facial malar rash Eyes- anicteric, neuro- grossly intact, skin- no apparent rash or lesions or cyanosis, mouth- oral mucosa is pink   Labs/Other Tests and Data Reviewed:    Recent Labs: 06/21/2018: ALT 14; B Natriuretic Peptide 16.8 06/22/2018: BUN 8; Creatinine, Ser 0.90; Potassium 4.0; Sodium  140 07/25/2018: Hemoglobin 14.1; Platelets 354   Wt Readings from Last 3 Encounters:  08/30/18 278 lb (126.1 kg)  07/25/18 282 lb 12.8 oz (128.3 kg)  07/04/18 275 lb 3.2 oz (124.8 kg)     Other studies personally reviewed: Additional studies/ records that were reviewed today include:       2/20 Hgb 14.1  ASSESSMENT & PLAN:    Tachycardia-sinus  Hypertension -orthostatic  Orthostatic intolerance  Stress/depression  Alopecia    Start amlodipine 5 mg dailu  Stop bystolic as likely the cause of alopecia  Anxiety is troubling  With no BP will try and get ivabradine some how     COVID 19 screen The patient denies symptoms of COVID 19 at this time.  The importance of social distancing was discussed today.  Follow-up:  Telehealth 3 weeks Next remote:  na  Current medicines are reviewed at length with the patient today.   The patient has concerns regarding her medicines.  The following changes were made today:  As above   Labs/ tests ordered today include:  No orders of the defined types were placed in this encounter.   Future tests ( post COVID )  .  Today, I have spent 27 minutes with the patient with telehealth technology discussing   .    Signed, Sherryl Manges, MD  08/30/2018 3:24 PM     [facility] [address] [place] [address] [phone] (office) [phone] (fax)

## 2018-09-01 ENCOUNTER — Other Ambulatory Visit: Payer: Self-pay | Admitting: Neurosurgery

## 2018-09-01 DIAGNOSIS — M5440 Lumbago with sciatica, unspecified side: Secondary | ICD-10-CM

## 2018-09-02 ENCOUNTER — Encounter: Payer: Self-pay | Admitting: Family Medicine

## 2018-09-05 MED ORDER — FAMOTIDINE 40 MG PO TABS: 40.0000 mg | ORAL_TABLET | Freq: Every day | ORAL | 1 refills | Status: DC

## 2018-09-07 ENCOUNTER — Other Ambulatory Visit: Payer: Self-pay

## 2018-09-07 ENCOUNTER — Ambulatory Visit (INDEPENDENT_AMBULATORY_CARE_PROVIDER_SITE_OTHER): Payer: BLUE CROSS/BLUE SHIELD | Admitting: Family Medicine

## 2018-09-07 ENCOUNTER — Encounter: Payer: Self-pay | Admitting: Family Medicine

## 2018-09-07 DIAGNOSIS — F1190 Opioid use, unspecified, uncomplicated: Secondary | ICD-10-CM

## 2018-09-07 DIAGNOSIS — M5136 Other intervertebral disc degeneration, lumbar region: Secondary | ICD-10-CM | POA: Diagnosis not present

## 2018-09-07 DIAGNOSIS — M48061 Spinal stenosis, lumbar region without neurogenic claudication: Secondary | ICD-10-CM | POA: Diagnosis not present

## 2018-09-07 DIAGNOSIS — R293 Abnormal posture: Secondary | ICD-10-CM | POA: Diagnosis not present

## 2018-09-07 DIAGNOSIS — M545 Low back pain: Secondary | ICD-10-CM | POA: Diagnosis not present

## 2018-09-07 DIAGNOSIS — R262 Difficulty in walking, not elsewhere classified: Secondary | ICD-10-CM | POA: Diagnosis not present

## 2018-09-07 NOTE — Progress Notes (Signed)
Subjective:    Patient ID: [name], female    DOB: 04-Jan-1976, 43 y.o.   MRN: [id number]  HPI Virtual Visit via Video Note  I connected with the patient on 09/07/18 at  3:00 PM EDT by a video enabled telemedicine application and verified that I am speaking with the correct person using two identifiers.  Location patient: home Location provider:work or home office Persons participating in the virtual visit: patient, provider  I discussed the limitations of evaluation and management by telemedicine and the availability of in person appointments. The patient expressed understanding and agreed to proceed.   HPI: We are meeting for a pain management visit. She is still dealing with a fair amount of back pain, but she is participating in virtual PT sessions to strengthen her back.  Indication for chronic opioid: low back pain  Medication and dose: Oxycodone 15 mg and Dilaudid 8 mg  # pills per month: 180 and 30  Last UDS date: 03-09-18 Opioid Treatment Agreement signed (Y/N): 03-09-18 Opioid Treatment Agreement last reviewed with patient:  09-07-18 [facility] reviewed this encounter (include red flags):  09-07-18    ROS: See pertinent positives and negatives per HPI.  Past Medical History:  Diagnosis Date  . Allergy   . Anxiety   . Arthritis   . B12 deficiency   . Chronic narcotic use   . Depression    not currently   . Dysrhythmia    hx tachy-was from medication nortriptylline  . Esophagitis    rx  . Fibromyalgia   . GERD (gastroesophageal reflux disease)   . History of echocardiogram    Echo 4/18: Mild concentric LVH, vigorous LVF, EF 65-70, normal wall motion, grade 1 diastolic dysfunction  . History of kidney stones   . Hyperlipidemia   . Intervertebral disc protrusion 01/11/2014  . Migraine   . Obesity (BMI 35.0-39.9 without comorbidity)   . Peripheral neuropathy   . Polycystic ovaries   . Pulmonary embolus (HCC)   . Spinal stenosis, lumbar   . Vitamin D  deficiency     Past Surgical History:  Procedure Laterality Date  . 24 HOUR PH STUDY N/A 05/27/2016   Procedure: 24 HOUR PH STUDY;  Surgeon: Ruffin Frederick, MD;  Location: [facility];  Service: Gastroenterology;  Laterality: N/A;  . ABDOMINAL EXPOSURE N/A 05/04/2018   Procedure: ABDOMINAL EXPOSURE;  Surgeon: Larina Earthly, MD;  Location: [facility] OR;  Service: Vascular;  Laterality: N/A;  . ANTERIOR LUMBAR FUSION N/A 05/04/2018   Procedure: Lumbar Five Sacral One Anterior lumbar interbody fusion;  Surgeon: Donalee Citrin, MD;  Location: [facility] OR;  Service: Neurosurgery;  Laterality: N/A;  Lumbar Five Sacral One Anterior lumbar interbody fusion  . APPENDECTOMY    . BACK SURGERY  2012   left laminectomy at L3-4 per Dr. Phoebe Perch   . bladder tack  2012  . CHOLECYSTECTOMY N/A 10/03/2015   Procedure: LAPAROSCOPIC CHOLECYSTECTOMY;  Surgeon: Rodman Pickle, MD;  Location: [facility] OR;  Service: General;  Laterality: N/A;  . ESOPHAGEAL MANOMETRY N/A 05/27/2016   Procedure: ESOPHAGEAL MANOMETRY (EM);  Surgeon: Ruffin Frederick, MD;  Location: [facility];  Service: Gastroenterology;  Laterality: N/A;  . EXCISION OF SKIN TAG  10/03/2015   Procedure: EXCISION OF SKIN TAG;  Surgeon: De Blanch Kinsinger, MD;  Location: [facility] OR;  Service: General;;  . LUMBAR DISC SURGERY  01-31-14   per Dr. [name]   . OOPHORECTOMY     left overy  . OVARIAN CYST  REMOVAL    . RECTOCELE REPAIR    . SINUSOTOMY  12/14/06  . spinal fusion  2016/10  . TONSILLECTOMY    . TONSILLECTOMY    . VAGINAL HYSTERECTOMY    . WRIST GANGLION EXCISION Left     Family History  Problem Relation Age of Onset  . Fibromyalgia Mother   . Diabetes Mother   . Thyroid cancer Mother   . Liver disease Mother   . Neuropathy Mother   . Cirrhosis Mother        non-alcoholic  . Pulmonary embolism Mother        both lungs  . Hypertension Father   . Diabetes Father   . Heart disease Father   . Prostate cancer Father   . Heart attack Father         x 2  . Colon cancer Neg Hx   . Other Neg Hx        pheochromocytoma  . Colon polyps Neg Hx      Current Outpatient Medications:  .  ALPRAZolam (XANAX) 0.5 MG tablet, TAKE 1 TABLET BY MOUTH THREE TIMES A DAY AS NEEDED, Disp: 90 tablet, Rfl: 5 .  amLODipine (NORVASC) 5 MG tablet, Take 1 tablet (5 mg total) by mouth daily., Disp: 90 tablet, Rfl: 3 .  calcium carbonate (TUMS - DOSED IN MG ELEMENTAL CALCIUM) 500 MG chewable tablet, Chew 1 tablet by mouth 2 (two) times daily as needed for indigestion or heartburn., Disp: , Rfl:  .  cetirizine (ZYRTEC) 10 MG tablet, Take 10 mg by mouth daily., Disp: , Rfl:  .  cyanocobalamin (,VITAMIN B-12,) 1000 MCG/ML injection, INJECT 1 ML (1,000 MCG TOTAL) INTO THE MUSCLE EVERY 7 DAYS., Disp: 4 mL, Rfl: 8 .  cyclobenzaprine (FLEXERIL) 10 MG tablet, Take 10 mg by mouth 3 (three) times daily as needed for muscle spasms., Disp: , Rfl:  .  docusate sodium (COLACE) 100 MG capsule, Take 100 mg by mouth at bedtime., Disp: , Rfl:  .  famotidine (PEPCID) 40 MG tablet, Take 1 tablet (40 mg total) by mouth at bedtime., Disp: 90 tablet, Rfl: 1 .  fluconazole (DIFLUCAN) 150 MG tablet, TAKE 1 TABLET (150 MG TOTAL) BY MOUTH DAILY., Disp: 5 tablet, Rfl: 5 .  fluticasone (FLONASE) 50 MCG/ACT nasal spray, Place 2 sprays into both nostrils every evening. , Disp: , Rfl:  .  gabapentin (NEURONTIN) 300 MG capsule, TAKE 1 CAPSULE BY MOUTH THREE TIMES A DAY, Disp: 90 capsule, Rfl: 3 .  hydrochlorothiazide (HYDRODIURIL) 25 MG tablet, TAKE 1 TABLET BY MOUTH EVERY DAY, Disp: 30 tablet, Rfl: 5 .  Multiple Vitamins-Minerals (MULTI-VITAMIN GUMMIES PO), Take 2 tablets by mouth daily. , Disp: , Rfl:  .  nitrofurantoin, macrocrystal-monohydrate, (MACROBID) 100 MG capsule, Take 100 mg by mouth daily as needed (for uti prophylaxis, after intercourse). , Disp: , Rfl: 2 .  oxyCODONE (ROXICODONE) 15 MG immediate release tablet, Take 1 tablet (15 mg total) by mouth every 4 (four) hours as needed for  up to 30 days for pain., Disp: 180 tablet, Rfl: 0 .  pantoprazole (PROTONIX) 40 MG tablet, TAKE 1 TABLET BY MOUTH TWICE A DAY **NEED PA FOR BID**, Disp: 180 tablet, Rfl: 0 .  potassium chloride (KLOR-CON 10) 10 MEQ tablet, Take 1 tablet (10 mEq total) by mouth daily., Disp: 90 tablet, Rfl: 3 .  promethazine (PHENERGAN) 25 MG tablet, TAKE 1 TABLET BY MOUTH EVERY 6 HOURS AS NEEDED FOR NAUSEA, Disp: 90 tablet, Rfl: 1 .  simethicone (MYLICON) 80 MG chewable tablet, Chew 160 mg by mouth every 6 (six) hours as needed (gas pain)., Disp: , Rfl:  .  SUMAtriptan (IMITREX) 100 MG tablet, Take 1 tablet earliest onset of migraine.  May repeat once in 2 hours if headache persists or recurs., Disp: 10 tablet, Rfl: 2 .  SYRINGE-NEEDLE, DISP, 3 ML (B-D 3CC LUER-LOK SYR 25GX1") 25G X 1" 3 ML MISC, USE AS DIRECTED, Disp: 9 each, Rfl: 11 .  Vitamin D, Ergocalciferol, (DRISDOL) 1.25 MG (50000 UT) CAPS capsule, TAKE 1 CAPSULE (50,000 UNITS TOTAL) BY MOUTH EVERY 7 (SEVEN) DAYS., Disp: 12 capsule, Rfl: 3 .  XARELTO 20 MG TABS tablet, TAKE 1 TABLET (20 MG TOTAL) BY MOUTH DAILY WITH SUPPER. **P/A DENIED**, Disp: 30 tablet, Rfl: 3  EXAM:  VITALS per patient if applicable:  GENERAL: alert, oriented, appears well and in no acute distress  HEENT: atraumatic, conjunttiva clear, no obvious abnormalities on inspection of external nose and ears  NECK: normal movements of the head and neck  LUNGS: on inspection no signs of respiratory distress, breathing rate appears normal, no obvious gross SOB, gasping or wheezing  CV: no obvious cyanosis  MS: moves all visible extremities without noticeable abnormality  PSYCH/NEURO: pleasant and cooperative, no obvious depression or anxiety, speech and thought processing grossly intact  ASSESSMENT AND PLAN:  Discussed the following assessment and plan:  No diagnosis found.     I discussed the assessment and treatment plan with the patient. The patient was provided an opportunity  to ask questions and all were answered. The patient agreed with the plan and demonstrated an understanding of the instructions.   The patient was advised to call back or seek an in-person evaluation if the symptoms worsen or if the condition fails to improve as anticipated.     Review of Systems     Objective:   Physical Exam        Assessment & Plan:

## 2018-09-09 ENCOUNTER — Telehealth: Payer: Self-pay | Admitting: Family Medicine

## 2018-09-09 NOTE — Telephone Encounter (Signed)
Copied from CRM [id number]. Topic: Quick Communication - Rx Refill/Question >> Sep 09, 2018 11:50 AM Jens Som A wrote: Medication: Patient had a virtual visit on 09/07/18. Medications where supposed to be sent to the pharmacy bu they were not.   oxyCODONE (ROXICODONE) 15 MG immediate release tablet [[id number]]  HYDROmorphone (DILAUDID) 2 MG tablet [[id number]]  DISCONTINUED    Has the patient contacted their pharmacy? Yes  (Agent: If no, request that the patient contact the pharmacy for the refill.) (Agent: If yes, when and what did the pharmacy advise?)  Preferred Pharmacy (with phone number or street name): [id number] [place], [place] - [address] [phone] (Phone) [phone] (Fax)   Agent: Please be advised that RX refills may take up to 3 business days. We ask that you follow-up with your pharmacy.

## 2018-09-12 MED ORDER — OXYCODONE HCL 15 MG PO TABS: 15.0000 mg | ORAL_TABLET | ORAL | 0 refills | Status: DC | PRN

## 2018-09-12 MED ORDER — HYDROMORPHONE HCL 8 MG PO TABS: 8.0000 mg | ORAL_TABLET | Freq: Every day | ORAL | 0 refills | Status: DC

## 2018-09-12 NOTE — Telephone Encounter (Signed)
These were sent in  

## 2018-09-12 NOTE — Telephone Encounter (Signed)
Dr. Fry please advise. Thanks  

## 2018-09-12 NOTE — Telephone Encounter (Signed)
Routed incorrectly  °

## 2018-09-13 DIAGNOSIS — M5136 Other intervertebral disc degeneration, lumbar region: Secondary | ICD-10-CM | POA: Diagnosis not present

## 2018-09-14 ENCOUNTER — Ambulatory Visit (INDEPENDENT_AMBULATORY_CARE_PROVIDER_SITE_OTHER): Payer: BLUE CROSS/BLUE SHIELD | Admitting: Licensed Clinical Social Worker

## 2018-09-14 DIAGNOSIS — F331 Major depressive disorder, recurrent, moderate: Secondary | ICD-10-CM | POA: Diagnosis not present

## 2018-09-15 DIAGNOSIS — M5136 Other intervertebral disc degeneration, lumbar region: Secondary | ICD-10-CM | POA: Diagnosis not present

## 2018-09-19 ENCOUNTER — Other Ambulatory Visit: Payer: Self-pay

## 2018-09-19 ENCOUNTER — Telehealth (INDEPENDENT_AMBULATORY_CARE_PROVIDER_SITE_OTHER): Payer: BLUE CROSS/BLUE SHIELD | Admitting: Internal Medicine

## 2018-09-19 VITALS — BP 143/93 | HR 94 | Ht 69.0 in | Wt 282.0 lb

## 2018-09-19 DIAGNOSIS — I951 Orthostatic hypotension: Secondary | ICD-10-CM

## 2018-09-19 DIAGNOSIS — R000 Tachycardia, unspecified: Secondary | ICD-10-CM

## 2018-09-19 DIAGNOSIS — I10 Essential (primary) hypertension: Secondary | ICD-10-CM

## 2018-09-19 MED ORDER — VERAPAMIL HCL ER 120 MG PO TBCR: 120.0000 mg | EXTENDED_RELEASE_TABLET | Freq: Every day | ORAL | 3 refills | Status: DC

## 2018-09-19 NOTE — Progress Notes (Signed)
Electrophysiology TeleHealth Note   Due to national recommendations of social distancing due to COVID 19, an audio/video telehealth visit is felt to be most appropriate for this patient at this time.  See MyChart message from today for the patient's consent to telehealth for [facility].   Date:  09/19/2018   ID:  [name], [name] [birth year], MRN [id number]  Location: patient's home  Provider location: [address] [place]  Evaluation Performed: Follow-up visit  PCP:  [name], MD  Cardiologist:     Electrophysiologist:  SK   Chief Complaint:  Sinus tachycardia  History of Present Illness:    [name] is a 43 y.o. female who presents via audio/video conferencing for a telehealth visit today.  Since last being seen with Telehealth visit for sinus tach, HTN and orthostatic LH    the patient reports *worsening tachycardia and dyspnea on exertion with the discontinuation of her beta-blocker (see below).  Occasional chest pains.  At the last visit we had stopped bystolic as she had significant alopecia; prev not tolerated dilt.  Added amlodipine for BP   Alopecia much improved.    Interval Pulm emboli 1/20  Now on anticoagulatio.  still taking without any significant bleeding  The patient denies symptoms of fevers, chills, cough, or new SOB worrisome for COVID 19.  Some sniffles ascribed to allergies   Past Medical History:  Diagnosis Date  . Allergy   . Anxiety   . Arthritis   . B12 deficiency   . Chronic narcotic use   . Depression    not currently   . Dysrhythmia    hx tachy-was from medication nortriptylline  . Esophagitis    rx  . Fibromyalgia   . GERD (gastroesophageal reflux disease)   . History of echocardiogram    Echo 4/18: Mild concentric LVH, vigorous LVF, EF 65-70, normal wall motion, grade 1 diastolic dysfunction  . History of kidney stones   . Hyperlipidemia   . Intervertebral disc protrusion 01/11/2014  . Migraine    . Obesity (BMI 35.0-39.9 without comorbidity)   . Peripheral neuropathy   . Polycystic ovaries   . Pulmonary embolus (HCC)   . Spinal stenosis, lumbar   . Vitamin D deficiency     Past Surgical History:  Procedure Laterality Date  . 24 HOUR PH STUDY N/A 05/27/2016   Procedure: 24 HOUR PH STUDY;  Surgeon: Ruffin Frederick, MD;  Location: [facility];  Service: Gastroenterology;  Laterality: N/A;  . ABDOMINAL EXPOSURE N/A 05/04/2018   Procedure: ABDOMINAL EXPOSURE;  Surgeon: Larina Earthly, MD;  Location: [facility] OR;  Service: Vascular;  Laterality: N/A;  . ANTERIOR LUMBAR FUSION N/A 05/04/2018   Procedure: Lumbar Five Sacral One Anterior lumbar interbody fusion;  Surgeon: Donalee Citrin, MD;  Location: [facility] OR;  Service: Neurosurgery;  Laterality: N/A;  Lumbar Five Sacral One Anterior lumbar interbody fusion  . APPENDECTOMY    . BACK SURGERY  2012   left laminectomy at L3-4 per Dr. Phoebe Perch   . bladder tack  2012  . CHOLECYSTECTOMY N/A 10/03/2015   Procedure: LAPAROSCOPIC CHOLECYSTECTOMY;  Surgeon: Rodman Pickle, MD;  Location: [facility] OR;  Service: General;  Laterality: N/A;  . ESOPHAGEAL MANOMETRY N/A 05/27/2016   Procedure: ESOPHAGEAL MANOMETRY (EM);  Surgeon: Ruffin Frederick, MD;  Location: [facility];  Service: Gastroenterology;  Laterality: N/A;  . EXCISION OF SKIN TAG  10/03/2015   Procedure: EXCISION OF SKIN TAG;  Surgeon: Franky Macho  Michel SanteeAaron Kinsinger, MD;  [facility];  Service: General;;  . LUMBAR DISC SURGERY  01-31-14   per Dr. Wynetta Emeryram   . OOPHORECTOMY     left overy  . OVARIAN CYST REMOVAL    . RECTOCELE REPAIR    . SINUSOTOMY  12/14/06  . spinal fusion  2016/10  . TONSILLECTOMY    . TONSILLECTOMY    . VAGINAL HYSTERECTOMY    . WRIST GANGLION EXCISION Left     Current Outpatient Medications  Medication Sig Dispense Refill  . ALPRAZolam (XANAX) 0.5 MG tablet TAKE 1 TABLET BY MOUTH THREE TIMES A DAY AS NEEDED 90 tablet 5  . amLODipine (NORVASC) 5 MG tablet Take 1 tablet (5  mg total) by mouth daily. 90 tablet 3  . calcium carbonate (TUMS - DOSED IN MG ELEMENTAL CALCIUM) 500 MG chewable tablet Chew 1 tablet by mouth 2 (two) times daily as needed for indigestion or heartburn.    . cetirizine (ZYRTEC) 10 MG tablet Take 10 mg by mouth daily.    . cyanocobalamin (,VITAMIN B-12,) 1000 MCG/ML injection INJECT 1 ML (1,000 MCG TOTAL) INTO THE MUSCLE EVERY 7 DAYS. 4 mL 8  . cyclobenzaprine (FLEXERIL) 10 MG tablet Take 10 mg by mouth 3 (three) times daily as needed for muscle spasms.    Marland Kitchen. docusate sodium (COLACE) 100 MG capsule Take 100 mg by mouth at bedtime.    . famotidine (PEPCID) 40 MG tablet Take 1 tablet (40 mg total) by mouth at bedtime. 90 tablet 1  . fluconazole (DIFLUCAN) 150 MG tablet TAKE 1 TABLET (150 MG TOTAL) BY MOUTH DAILY. 5 tablet 5  . fluticasone (FLONASE) 50 MCG/ACT nasal spray Place 2 sprays into both nostrils every evening.     . gabapentin (NEURONTIN) 300 MG capsule TAKE 1 CAPSULE BY MOUTH THREE TIMES A DAY 90 capsule 3  . hydrochlorothiazide (HYDRODIURIL) 25 MG tablet TAKE 1 TABLET BY MOUTH EVERY DAY 30 tablet 5  . [START ON 11/12/2018] HYDROmorphone (DILAUDID) 8 MG tablet Take 1 tablet (8 mg total) by mouth at bedtime for 30 days. 30 tablet 0  . Multiple Vitamins-Minerals (MULTI-VITAMIN GUMMIES PO) Take 2 tablets by mouth daily.     . nitrofurantoin, macrocrystal-monohydrate, (MACROBID) 100 MG capsule Take 100 mg by mouth daily as needed (for uti prophylaxis, after intercourse).   2  . [START ON 11/12/2018] oxyCODONE (ROXICODONE) 15 MG immediate release tablet Take 1 tablet (15 mg total) by mouth every 4 (four) hours as needed for up to 30 days for pain. 180 tablet 0  . pantoprazole (PROTONIX) 40 MG tablet TAKE 1 TABLET BY MOUTH TWICE A DAY **NEED PA FOR BID** 180 tablet 0  . potassium chloride (KLOR-CON 10) 10 MEQ tablet Take 1 tablet (10 mEq total) by mouth daily. 90 tablet 3  . promethazine (PHENERGAN) 25 MG tablet TAKE 1 TABLET BY MOUTH EVERY 6 HOURS  AS NEEDED FOR NAUSEA 90 tablet 1  . simethicone (MYLICON) 80 MG chewable tablet Chew 160 mg by mouth every 6 (six) hours as needed (gas pain).    . SUMAtriptan (IMITREX) 100 MG tablet Take 1 tablet earliest onset of migraine.  Hannah repeat once in 2 hours if headache persists or recurs. 10 tablet 2  . SYRINGE-NEEDLE, DISP, 3 ML (B-D 3CC LUER-LOK SYR 25GX1") 25G X 1" 3 ML MISC USE AS DIRECTED 9 each 11  . Vitamin D, Ergocalciferol, (DRISDOL) 1.25 MG (50000 UT) CAPS capsule TAKE 1 CAPSULE (50,000 UNITS TOTAL) BY MOUTH EVERY 7 (  SEVEN) DAYS. 12 capsule 3  . XARELTO 20 MG TABS tablet TAKE 1 TABLET (20 MG TOTAL) BY MOUTH DAILY WITH SUPPER. **P/A DENIED** 30 tablet 3   No current facility-administered medications for this visit.     Allergies:   Gadolinium derivatives; Other; and Zoloft [sertraline hcl]   Social History:  The patient  reports that she quit smoking about 21 years ago. Her smoking use included cigarettes. She has never used smokeless tobacco. She reports that she does not drink alcohol or use drugs.   Family History:  The patient's   family history includes Cirrhosis in her mother; Diabetes in her father and mother; Fibromyalgia in her mother; Heart attack in her father; Heart disease in her father; Hypertension in her father; Liver disease in her mother; Neuropathy in her mother; Prostate cancer in her father; Pulmonary embolism in her mother; Thyroid cancer in her mother.   ROS:  Please see the history of present illness.   All other systems are personally reviewed and negative.    Exam:    Vital Signs:  BP (!) 143/93   Pulse 94   Ht 5\' 9"  (1.753 m)   Wt 282 lb (127.9 kg)   LMP 12/13/1999 Comment: single oophorectomy//a.c.  BMI 41.64 kg/m     Well appearing, alert and conversant, regular work of breathing,  good skin color Eyes- anicteric, neuro- grossly intact, skin- no apparent rash or lesions or cyanosis, mouth- oral mucosa is pink   Labs/Other Tests and Data Reviewed:     Recent Labs: 06/21/2018: ALT 14; B Natriuretic Peptide 16.8 06/22/2018: BUN 8; Creatinine, Ser 0.90; Potassium 4.0; Sodium 140 07/25/2018: Hemoglobin 14.1; Platelets 354   Wt Readings from Last 3 Encounters:  09/19/18 282 lb (127.9 kg)  08/30/18 278 lb (126.1 kg)  07/25/18 282 lb 12.8 oz (128.3 kg)          ASSESSMENT & PLAN:    Tachycardia-sinus  Hypertension -orthostatic  Orthostatic intolerance  Pulmonary embolism Post operative spinal fusion 12/19  Dyspnea on exertion  Alopecia with beta-blockers  Stress/depression   Alopecia precludes use of beta-blocker; she tried amlodipine at home which he had on the shelf; decreased palpitations but not heart rate.  Continues with episodic dyspnea associated with rapid rate; suspect related to sinus tachycardia.  We will try verapamil.  We will start at 120 mg once a day  I reviewed with her the possibility of Corlanor.  Struggles with not be able to take antidepressants.  Her anxiety is also very high; her husband leaves back out of town tomorrow.  Will need to check Hgb on anticoagulation esp w worseningSOB and tachycardia    COVID 19 screen The patient denies symptoms of COVID 19 at this time.  The importance of social distancing was discussed today.  Follow-up:  4 w Telehealth visit        Current medicines are reviewed at length with the patient today.   The patient has concerns regarding her medicines.  The following changes were made today:  As above   Labs/ tests ordered today include: .As above  No orders of the defined types were placed in this encounter.     Patient Risk:  after full review of this patients clinical status, I feel that they are at moderate*  risk at this time.  Today, I have spent 15 minutes with the patient with telehealth technology discussing the above.  Signed, Sherryl Manges, MD  09/19/2018 9:23 AM     [facility] [address] [place] [address]  [phone] (office) [phone] (fax)

## 2018-09-21 DIAGNOSIS — M5136 Other intervertebral disc degeneration, lumbar region: Secondary | ICD-10-CM | POA: Diagnosis not present

## 2018-09-22 DIAGNOSIS — M5136 Other intervertebral disc degeneration, lumbar region: Secondary | ICD-10-CM | POA: Diagnosis not present

## 2018-09-28 ENCOUNTER — Ambulatory Visit (INDEPENDENT_AMBULATORY_CARE_PROVIDER_SITE_OTHER): Payer: BLUE CROSS/BLUE SHIELD | Admitting: Licensed Clinical Social Worker

## 2018-09-28 ENCOUNTER — Other Ambulatory Visit: Payer: Self-pay

## 2018-09-28 ENCOUNTER — Telehealth: Payer: Self-pay | Admitting: Internal Medicine

## 2018-09-28 DIAGNOSIS — F331 Major depressive disorder, recurrent, moderate: Secondary | ICD-10-CM

## 2018-09-28 DIAGNOSIS — M5136 Other intervertebral disc degeneration, lumbar region: Secondary | ICD-10-CM | POA: Diagnosis not present

## 2018-09-28 DIAGNOSIS — D649 Anemia, unspecified: Secondary | ICD-10-CM

## 2018-09-28 NOTE — Telephone Encounter (Signed)
See MyChart message

## 2018-09-28 NOTE — Telephone Encounter (Signed)
New Message            Patient is calling int today to see if there is something else she can take other than "Verapermil" patient states it's to many side effects and she stop taking the medicine 2 days ago. Pls call to advise.

## 2018-09-30 ENCOUNTER — Other Ambulatory Visit: Payer: BLUE CROSS/BLUE SHIELD

## 2018-09-30 MED ORDER — LISINOPRIL 10 MG PO TABS: 10.0000 mg | ORAL_TABLET | Freq: Every day | ORAL | 3 refills | Status: DC

## 2018-09-30 NOTE — Telephone Encounter (Signed)
Spoke with pt regarding her MyChart message. She states she has stopped taking Verapamil completely. She attributes hair loss and headaches to when she began the medication. Currently she is not taking anything for HTN management.  I advised her that Dr Graciela Husbands would like her to begin back on her Amlodipine. She states this medication was not a good fit for her either as it caused foot pain and ankle swelling.   Currently she reports her bps running in the 120's to 130s systolic and her heart rate in the mid 80's, but she believes her heart rate will continue to rise without any rate control.   I advised her I would discuss with Dr Graciela Husbands sometime today and either MyChart her or call her with his recommendations. She verbalized understanding and has no additional questions.

## 2018-10-05 DIAGNOSIS — M5136 Other intervertebral disc degeneration, lumbar region: Secondary | ICD-10-CM | POA: Diagnosis not present

## 2018-10-06 DIAGNOSIS — M5136 Other intervertebral disc degeneration, lumbar region: Secondary | ICD-10-CM | POA: Diagnosis not present

## 2018-10-11 DIAGNOSIS — M5136 Other intervertebral disc degeneration, lumbar region: Secondary | ICD-10-CM | POA: Diagnosis not present

## 2018-10-12 ENCOUNTER — Ambulatory Visit (INDEPENDENT_AMBULATORY_CARE_PROVIDER_SITE_OTHER): Payer: BLUE CROSS/BLUE SHIELD | Admitting: Licensed Clinical Social Worker

## 2018-10-12 DIAGNOSIS — F3341 Major depressive disorder, recurrent, in partial remission: Secondary | ICD-10-CM | POA: Diagnosis not present

## 2018-10-17 ENCOUNTER — Other Ambulatory Visit: Payer: Self-pay

## 2018-10-17 ENCOUNTER — Ambulatory Visit
Admission: RE | Admit: 2018-10-17 | Discharge: 2018-10-17 | Disposition: A | Discharge status: Home / Self Care | Payer: BLUE CROSS/BLUE SHIELD | Source: Ambulatory Visit | Attending: Neurosurgery | Admitting: Neurosurgery

## 2018-10-17 DIAGNOSIS — M545 Low back pain: Secondary | ICD-10-CM | POA: Diagnosis not present

## 2018-10-17 DIAGNOSIS — M5440 Lumbago with sciatica, unspecified side: Secondary | ICD-10-CM

## 2018-10-18 ENCOUNTER — Telehealth (INDEPENDENT_AMBULATORY_CARE_PROVIDER_SITE_OTHER): Payer: BLUE CROSS/BLUE SHIELD | Admitting: Internal Medicine

## 2018-10-18 ENCOUNTER — Encounter: Payer: Self-pay | Admitting: Internal Medicine

## 2018-10-18 ENCOUNTER — Other Ambulatory Visit: Payer: Self-pay

## 2018-10-18 VITALS — BP 133/82 | Ht 69.0 in | Wt 268.0 lb

## 2018-10-18 DIAGNOSIS — I951 Orthostatic hypotension: Secondary | ICD-10-CM | POA: Diagnosis not present

## 2018-10-18 DIAGNOSIS — R000 Tachycardia, unspecified: Secondary | ICD-10-CM

## 2018-10-18 NOTE — Progress Notes (Addendum)
Electrophysiology TeleHealth Note   Due to national recommendations of social distancing due to COVID 19, an audio/video telehealth visit is felt to be most appropriate for this patient at this time.  See MyChart message from today for the patient's consent to telehealth for [facility].   Date:  10/18/2018   ID:  [name], DOB 08/08/1975, MRN [id number]  Location: patient's home  Provider location: [address], [place]  Evaluation Performed: Follow-up visit  PCP:  Nelwyn SalisburyFry, [name], MD  Cardiologist:     Electrophysiologist:  SK   Chief Complaint:  Sinus tachycardia  History of Present Illness:    [name] is a 43 y.o. female who presents via audio/video conferencing for a telehealth visit today.  Since last being seen with Telehealth visit for sinus tach, HTN and orthostatic LH where in she reported worsening tachycardia and DOE with discontinuation of BB stopped 2/2 alopecia which did indeed improve  Elected to try verapamil, this also turned out to be assoc temporally with alopecia-- hair in the bed.  "cant stand it" so she stopped all her heart meds   BP most recently 130s  HRs in the 70s at rest  Continues with dyspnea on exertion-- she relates that she in been in bed for most of the past 5 years b/c of back pain.  Her PT is encouraging her to sit up as much as possible-- Ihad also apparently earlier, but now she is trying to do it  HR still sometimes without obvious stimulation>> 150-200's for 10-20 min.  Interval Pulm emboli 1/20  Now on anticoagulation without clinically over bleeding   No symptoms worrisome for COVID   Past Medical History:  Diagnosis Date  . Allergy   . Anxiety   . Arthritis   . B12 deficiency   . Chronic narcotic use   . Depression    not currently   . Dysrhythmia    hx tachy-was from medication nortriptylline  . Esophagitis    rx  . Fibromyalgia   . GERD (gastroesophageal reflux disease)   . History of  echocardiogram    Echo 4/18: Mild concentric LVH, vigorous LVF, EF 65-70, normal wall motion, grade 1 diastolic dysfunction  . History of kidney stones   . Hyperlipidemia   . Intervertebral disc protrusion 01/11/2014  . Migraine   . Obesity (BMI 35.0-39.9 without comorbidity)   . Peripheral neuropathy   . Polycystic ovaries   . Pulmonary embolus (HCC)   . Spinal stenosis, lumbar   . Vitamin D deficiency     Past Surgical History:  Procedure Laterality Date  . 24 HOUR PH STUDY N/A 05/27/2016   Procedure: 24 HOUR PH STUDY;  Surgeon: Ruffin FrederickSteven Paul Armbruster, MD;  Location: [facility];  Service: Gastroenterology;  Laterality: N/A;  . ABDOMINAL EXPOSURE N/A 05/04/2018   Procedure: ABDOMINAL EXPOSURE;  Surgeon: Larina EarthlyEarly, Todd F, MD;  [facility];  Service: Vascular;  Laterality: N/A;  . ANTERIOR LUMBAR FUSION N/A 05/04/2018   Procedure: Lumbar Five Sacral One Anterior lumbar interbody fusion;  Surgeon: Donalee Citrinram, Gary, MD;  [facility];  Service: Neurosurgery;  Laterality: N/A;  Lumbar Five Sacral One Anterior lumbar interbody fusion  . APPENDECTOMY    . BACK SURGERY  2012   left laminectomy at L3-4 per Dr. Phoebe PerchHirsch   . bladder tack  2012  . CHOLECYSTECTOMY N/A 10/03/2015   Procedure: LAPAROSCOPIC CHOLECYSTECTOMY;  Surgeon: Rodman PickleLuke Aaron Kinsinger, MD;  Location: [facility] OR;  Service: General;  Laterality: N/A;  . ESOPHAGEAL MANOMETRY N/A 05/27/2016   Procedure: ESOPHAGEAL MANOMETRY (EM);  Surgeon: Ruffin Frederick, MD;  Location: [facility];  Service: Gastroenterology;  Laterality: N/A;  . EXCISION OF SKIN TAG  10/03/2015   Procedure: EXCISION OF SKIN TAG;  Surgeon: De Blanch Kinsinger, MD;  Location: [facility] OR;  Service: General;;  . LUMBAR DISC SURGERY  01-31-14   per Dr. [name]   . OOPHORECTOMY     left overy  . OVARIAN CYST REMOVAL    . RECTOCELE REPAIR    . SINUSOTOMY  12/14/06  . spinal fusion  2016/10  . TONSILLECTOMY    . TONSILLECTOMY    . VAGINAL HYSTERECTOMY    . WRIST GANGLION  EXCISION Left     Current Outpatient Medications  Medication Sig Dispense Refill  . ALPRAZolam (XANAX) 0.5 MG tablet TAKE 1 TABLET BY MOUTH THREE TIMES A DAY AS NEEDED 90 tablet 5  . amLODipine (NORVASC) 5 MG tablet Take 1 tablet (5 mg total) by mouth daily. 90 tablet 3  . calcium carbonate (TUMS - DOSED IN MG ELEMENTAL CALCIUM) 500 MG chewable tablet Chew 1 tablet by mouth 2 (two) times daily as needed for indigestion or heartburn.    . cetirizine (ZYRTEC) 10 MG tablet Take 10 mg by mouth daily.    . cyanocobalamin (,VITAMIN B-12,) 1000 MCG/ML injection INJECT 1 ML (1,000 MCG TOTAL) INTO THE MUSCLE EVERY 7 DAYS. 4 mL 8  . cyclobenzaprine (FLEXERIL) 10 MG tablet Take 10 mg by mouth 3 (three) times daily as needed for muscle spasms.    Marland Kitchen docusate sodium (COLACE) 100 MG capsule Take 100 mg by mouth at bedtime.    . famotidine (PEPCID) 40 MG tablet Take 1 tablet (40 mg total) by mouth at bedtime. 90 tablet 1  . fluconazole (DIFLUCAN) 150 MG tablet TAKE 1 TABLET (150 MG TOTAL) BY MOUTH DAILY. 5 tablet 5  . fluticasone (FLONASE) 50 MCG/ACT nasal spray Place 2 sprays into both nostrils every evening.     . gabapentin (NEURONTIN) 300 MG capsule TAKE 1 CAPSULE BY MOUTH THREE TIMES A DAY 90 capsule 3  . hydrochlorothiazide (HYDRODIURIL) 25 MG tablet TAKE 1 TABLET BY MOUTH EVERY DAY 30 tablet 5  . [START ON 11/12/2018] HYDROmorphone (DILAUDID) 8 MG tablet Take 1 tablet (8 mg total) by mouth at bedtime for 30 days. 30 tablet 0  . lisinopril (ZESTRIL) 10 MG tablet Take 1 tablet (10 mg total) by mouth daily. 90 tablet 3  . Multiple Vitamins-Minerals (MULTI-VITAMIN GUMMIES PO) Take 2 tablets by mouth daily.     . nitrofurantoin, macrocrystal-monohydrate, (MACROBID) 100 MG capsule Take 100 mg by mouth daily as needed (for uti prophylaxis, after intercourse).   2  . [START ON 11/12/2018] oxyCODONE (ROXICODONE) 15 MG immediate release tablet Take 1 tablet (15 mg total) by mouth every 4 (four) hours as needed for  up to 30 days for pain. 180 tablet 0  . pantoprazole (PROTONIX) 40 MG tablet TAKE 1 TABLET BY MOUTH TWICE A DAY **NEED PA FOR BID** 180 tablet 0  . potassium chloride (KLOR-CON 10) 10 MEQ tablet Take 1 tablet (10 mEq total) by mouth daily. 90 tablet 3  . promethazine (PHENERGAN) 25 MG tablet TAKE 1 TABLET BY MOUTH EVERY 6 HOURS AS NEEDED FOR NAUSEA 90 tablet 1  . simethicone (MYLICON) 80 MG chewable tablet Chew 160 mg by mouth every 6 (six) hours as needed (gas pain).    . SUMAtriptan (IMITREX)  100 MG tablet Take 1 tablet earliest onset of migraine.  May repeat once in 2 hours if headache persists or recurs. 10 tablet 2  . SYRINGE-NEEDLE, DISP, 3 ML (B-D 3CC LUER-LOK SYR 25GX1") 25G X 1" 3 ML MISC USE AS DIRECTED 9 each 11  . verapamil (CALAN-SR) 120 MG CR tablet Take 1 tablet (120 mg total) by mouth at bedtime. 90 tablet 3  . Vitamin D, Ergocalciferol, (DRISDOL) 1.25 MG (50000 UT) CAPS capsule TAKE 1 CAPSULE (50,000 UNITS TOTAL) BY MOUTH EVERY 7 (SEVEN) DAYS. 12 capsule 3  . XARELTO 20 MG TABS tablet TAKE 1 TABLET (20 MG TOTAL) BY MOUTH DAILY WITH SUPPER. **P/A DENIED** 30 tablet 3   No current facility-administered medications for this visit.     Allergies:   Gadolinium derivatives; Other; and Zoloft [sertraline hcl]   Social History:  The patient  reports that she quit smoking about 21 years ago. Her smoking use included cigarettes. She has never used smokeless tobacco. She reports that she does not drink alcohol or use drugs.   Family History:  The patient's   family history includes Cirrhosis in her mother; Diabetes in her father and mother; Fibromyalgia in her mother; Heart attack in her father; Heart disease in her father; Hypertension in her father; Liver disease in her mother; Neuropathy in her mother; Prostate cancer in her father; Pulmonary embolism in her mother; Thyroid cancer in her mother.   ROS:  Please see the history of present illness.   All other systems are personally  reviewed and negative.    Exam:    Vital Signs:  LMP 12/13/1999 Comment: single oophorectomy//a.c.    Well appearing, alert and conversant, regular work of breathing,  good skin color Eyes- anicteric, neuro- grossly intact, skin- no apparent rash or lesions or cyanosis, mouth- oral mucosa is [name]   Labs/Other Tests and Data Reviewed:    Recent Labs: 06/21/2018: ALT 14; B Natriuretic Peptide 16.8 06/22/2018: BUN 8; Creatinine, Ser 0.90; Potassium 4.0; Sodium 140 07/25/2018: Hemoglobin 14.1; Platelets 354   Wt Readings from Last 3 Encounters:  09/19/18 282 lb (127.9 kg)  08/30/18 278 lb (126.1 kg)  07/25/18 282 lb 12.8 oz (128.3 kg)          ASSESSMENT & PLAN:    Tachycardia-sinus  Hypertension -orthostatic  Orthostatic intolerance  Pulmonary embolism Post operative spinal fusion 12/19  Dyspnea on exertion  Alopecia with beta-blockers ? verapamil  Stress/depression   Reviewed with her the physiology of bedrest-- agree wholeheartedly with PT abouther sitting up as much as possible during the day, stressing walking and leg exercises and standing  Suggested she consider QUBII elliptical if ok w her PT  Alopecia remains a life altering problem--quick review less <1 % verapamil assoc with alopecia.  But now off everything, so will hold and follw HR and BP.  GIven her degree of deconditioning she is extremely prone to orthostatic symptoms and tachycardia  Tachycardia is relatively quiescent esp sitting-- conditioning I wthink affords her best hope  Depression remains an issue-- unable to tolerate most meds       COVID 19 screen The patient denies symptoms of COVID 19 at this time.  The importance of social distancing was discussed today.  Follow-up:  4 w Telehealth visit        Current medicines are reviewed at length with the patient today.   The patient has concerns regarding her medicines.  The following changes were made today:  As above  Labs/ tests  ordered today include: .As above  No orders of the defined types were placed in this encounter.     Patient Risk:  after full review of this patients clinical status, I feel that they are at moderate*  risk at this time.  Today, I have spent 20 minutes with the patient with telehealth technology discussing the above.  Signed, Sherryl Manges, MD  10/18/2018 2:39 PM     [facility] [address] [place] [address] [phone] (office) [phone] (fax)

## 2018-10-19 ENCOUNTER — Ambulatory Visit: Payer: BLUE CROSS/BLUE SHIELD | Admitting: Licensed Clinical Social Worker

## 2018-10-20 DIAGNOSIS — M5136 Other intervertebral disc degeneration, lumbar region: Secondary | ICD-10-CM | POA: Diagnosis not present

## 2018-10-26 ENCOUNTER — Ambulatory Visit (INDEPENDENT_AMBULATORY_CARE_PROVIDER_SITE_OTHER): Payer: BLUE CROSS/BLUE SHIELD | Admitting: Licensed Clinical Social Worker

## 2018-10-26 DIAGNOSIS — F331 Major depressive disorder, recurrent, moderate: Secondary | ICD-10-CM

## 2018-10-27 DIAGNOSIS — M5136 Other intervertebral disc degeneration, lumbar region: Secondary | ICD-10-CM | POA: Diagnosis not present

## 2018-10-27 DIAGNOSIS — M5440 Lumbago with sciatica, unspecified side: Secondary | ICD-10-CM | POA: Diagnosis not present

## 2018-11-02 DIAGNOSIS — R262 Difficulty in walking, not elsewhere classified: Secondary | ICD-10-CM | POA: Diagnosis not present

## 2018-11-02 DIAGNOSIS — M5136 Other intervertebral disc degeneration, lumbar region: Secondary | ICD-10-CM | POA: Diagnosis not present

## 2018-11-02 DIAGNOSIS — M545 Low back pain: Secondary | ICD-10-CM | POA: Diagnosis not present

## 2018-11-02 DIAGNOSIS — R293 Abnormal posture: Secondary | ICD-10-CM | POA: Diagnosis not present

## 2018-11-03 ENCOUNTER — Other Ambulatory Visit: Payer: Self-pay

## 2018-11-03 ENCOUNTER — Inpatient Hospital Stay: Payer: BC Managed Care – PPO

## 2018-11-03 ENCOUNTER — Inpatient Hospital Stay: Payer: BC Managed Care – PPO | Attending: Hematology

## 2018-11-03 DIAGNOSIS — M1711 Unilateral primary osteoarthritis, right knee: Secondary | ICD-10-CM | POA: Diagnosis not present

## 2018-11-03 DIAGNOSIS — Z7901 Long term (current) use of anticoagulants: Secondary | ICD-10-CM | POA: Insufficient documentation

## 2018-11-03 DIAGNOSIS — I2699 Other pulmonary embolism without acute cor pulmonale: Secondary | ICD-10-CM

## 2018-11-03 DIAGNOSIS — Z86711 Personal history of pulmonary embolism: Secondary | ICD-10-CM | POA: Diagnosis not present

## 2018-11-03 LAB — CBC WITH DIFFERENTIAL (CANCER CENTER ONLY)
Basophils Absolute: 0.1 10*3/uL (ref 0.0–0.1)
Basophils Relative: 1 %
Eosinophils Absolute: 0.1 10*3/uL (ref 0.0–0.5)
Eosinophils Relative: 2 %
HCT: 43.2 % (ref 36.0–46.0)
Hemoglobin: 14.4 g/dL (ref 12.0–15.0)
Lymphocytes Relative: 31 %
Lymphs Abs: 2.7 10*3/uL (ref 0.7–4.0)
MCH: 28.2 pg (ref 26.0–34.0)
MCHC: 33.3 g/dL (ref 30.0–36.0)
MCV: 84.7 fL (ref 80.0–100.0)
Monocytes Absolute: 0.5 10*3/uL (ref 0.1–1.0)
Monocytes Relative: 6 %
Neutro Abs: 5.3 10*3/uL (ref 1.7–7.7)
Neutrophils Relative %: 61 %
Platelet Count: 327 10*3/uL (ref 150–400)
RBC: 5.1 MIL/uL (ref 3.87–5.11)
RDW: 12.5 % (ref 11.5–15.5)
WBC Count: 8.7 10*3/uL (ref 4.0–10.5)
nRBC: 0 % (ref 0.0–0.2)

## 2018-11-03 LAB — CMP (CANCER CENTER ONLY)
ALT: 14 U/L (ref 0–44)
AST: 16 U/L (ref 15–41)
Albumin: 3.6 g/dL (ref 3.5–5.0)
Alkaline Phosphatase: 86 U/L (ref 38–126)
Anion gap: 10 (ref 5–15)
BUN: 13 mg/dL (ref 6–20)
CO2: 28 mmol/L (ref 22–32)
Calcium: 8.9 mg/dL (ref 8.9–10.3)
Chloride: 104 mmol/L (ref 98–111)
Creatinine: 0.92 mg/dL (ref 0.44–1.00)
GFR, Est AFR Am: >60 mL/min (ref >60)
GFR, Estimated: >60 mL/min (ref >60)
Glucose, Bld: 127 mg/dL — ABNORMAL HIGH (ref 70–99)
Potassium: 3.7 mmol/L (ref 3.5–5.1)
Sodium: 142 mmol/L (ref 135–145)
Total Bilirubin: 0.3 mg/dL (ref 0.3–1.2)
Total Protein: 7.1 g/dL (ref 6.5–8.1)

## 2018-11-04 LAB — LUPUS ANTICOAGULANT PANEL
DRVVT: 46.1 s (ref 0.0–47.0)
PTT Lupus Anticoagulant: 38.5 s (ref 0.0–51.9)

## 2018-11-08 ENCOUNTER — Other Ambulatory Visit: Payer: Self-pay | Admitting: Family Medicine

## 2018-11-09 ENCOUNTER — Other Ambulatory Visit: Payer: Self-pay | Admitting: Gastroenterology

## 2018-11-09 NOTE — Progress Notes (Signed)
HEMATOLOGY/ONCOLOGY CONSULTATION NOTE  Date of Service: 11/10/2018  Patient Care Team: Nelwyn SalisburyFry, [name], MD as PCP - Margretta SidleGeneral Cram, Gary, MD as Consulting Physician (Neurosurgery)  CHIEF COMPLAINTS/PURPOSE OF CONSULTATION:  History of Pulmonary Embolism  HISTORY OF PRESENTING ILLNESS:   [name] is a wonderful 43 y.o. female who has been referred to us by Dr. Gershon CraneStephen Fry for evaluation and management of History of Pulmonary Embolism. The pt reports that she is doing well overall.   Of note prior to the patient's visit today, she presented to the ED on 06/06/18 with SOB and was diagnosed with a PE with a CTA Chest. The pt has a history of lumbar spinal stenosis and had a lumbar fusion on 05/04/18.  She was discharged with 20mg  Xarelto.  The pt reports that this was her fifth back surgery, and second fusion. She notes that she was recovering from her surgery fairly, but around 05/25/18 she began feeling bad- with increased SOB. She notes that she was up and about in the weeks after her surgery, getting up "a couple times every day." She notes that the night before her new SOB, she had "intense pain" in her upper thighs. Subsequent DVTs in the ED ruled out DVT. The pt has never had a previous blood clot. She denies taking hormonal contraception, and had a hysterectomy in February 2001. The pt can't remember if she had blood thinners around the time of this most recent surgery. She notes that bone grafts were used in her most recent surgery as well.   The pt notes that she took PO contraceptives for 6 months, more than 20 years ago. She has had two pregnancies and two children. She has had at least 6 total surgeries, the last of which resulted in her only blood clot.  The pt notes that she currently is experiencing SOB and back pain after standing up and walking to the kitchen. She will be beginning PT next week. She is not sure if some of this is related to her anxiety. She notes that her  lowest O2 was not below 90% while ambulating. She notes that her pain and SOB improve after lying down.  The pt has recently had high blood pressure and has recently begun a BP medication. She notes that she has recently had concern for untreated sleep apnea.  The pt notes that her mother is very immobile at this time, and has had DVTs in the last couple years. The pt also notes that her mother previously had multiple surgeries and children without developing blood clots.  The pt endorses having anxiety in general, depression, and OCD tendencies. She notes that she has pursued TMS therapy, and sees a therapist every 2 weeks.  Of note prior to the patient's visit today, pt has had a CTA Chest completed on 06/06/18 with results revealing Foci of incompletely obstructing pulmonary embolus in lower lobe pulmonary artery branches on the right. No more central pulmonary embolus evident. No thoracic aortic aneurysm or dissection. No right heart strain. 2.  Mild bibasilar atelectasis.  No lung edema or consolidation. 3.  No appreciable thoracic adenopathy. 4.  Hepatic steatosis.  Most recent lab results (06/22/18) of CBC and BMP is as follows: all values are WNL except for PLT at 411k, Glucose at 119, Calcium at 8.6.  On review of systems, pt reports recent weight gain, recent SOB while ambulating, back pain, anxiety, and denies issues bleeding, leg swelling, abdominal pain, CP, and any other  symptoms.   On PMHx the pt reports lumbar spinal stenosis s/p lumbar fusion surgery on 05/04/18, hysterectomy. On Social Hx the pt reports she quit smoking cigarettes 23 years ago, denies consuming alcohol. Denies second hand smoke exposure.  On Family Hx the pt reports mother with DVTs   Interval History:   [name] returns today for management and evaluation of her history of Pulmonary Embolism. The patient's last visit with [place] was on 07/25/18. The pt reports that she is doing well overall.  The pt reports  that she has had much improved breathing which she feels was formerly due to deconditioning. She notes that she has been following up with PT and has been increasing her activity levels. She has lost 8 pounds since our last visit. The pt notes that she is able to walk for about 10 minutes on level ground before she develops significant enough back pain which requires her to stop. She denies leg swelling at this time. She notes that she has been able to tolerate Xarelto well and denies any concerns for bleeding.  Lab results (11/03/18) of CBC w/diff and CMP is as follows: all values are WNL except for Glucose at 127. 11/03/18 Lupus anticoagulant panel was negative.  On review of systems, pt reports improved breathing, increasing activity levels, chronic back pain, improved energy levels, and denies concerns for bleeding, leg swelling, calf pains, and any other symptoms.   MEDICAL HISTORY:  Past Medical History:  Diagnosis Date   Allergy    Anxiety    Arthritis    B12 deficiency    Chronic narcotic use    Depression    not currently    Dysrhythmia    hx tachy-was from medication nortriptylline   Esophagitis    rx   Fibromyalgia    GERD (gastroesophageal reflux disease)    History of echocardiogram    Echo 4/18: Mild concentric LVH, vigorous LVF, EF 65-70, normal wall motion, grade 1 diastolic dysfunction   History of kidney stones    Hyperlipidemia    Intervertebral disc protrusion 01/11/2014   Migraine    Obesity (BMI 35.0-39.9 without comorbidity)    Peripheral neuropathy    Polycystic ovaries    Pulmonary embolus ([place])    Spinal stenosis, lumbar    Vitamin D deficiency     SURGICAL HISTORY: Past Surgical History:  Procedure Laterality Date   17 HOUR [place] STUDY N/A 05/27/2016   Procedure: 24 HOUR [place] STUDY;  Surgeon: Manus Gunning, MD;  Location: [facility];  Service: Gastroenterology;  Laterality: N/A;   ABDOMINAL EXPOSURE N/A 05/04/2018    Procedure: ABDOMINAL EXPOSURE;  Surgeon: Rosetta Posner, MD;  Location: [place];  Service: Vascular;  Laterality: N/A;   ANTERIOR LUMBAR FUSION N/A 05/04/2018   Procedure: Lumbar Five Sacral One Anterior lumbar interbody fusion;  Surgeon: Kary Kos, MD;  Location: [place];  Service: Neurosurgery;  Laterality: N/A;  Lumbar Five Sacral One Anterior lumbar interbody fusion   APPENDECTOMY     BACK SURGERY  2012   left laminectomy at L3-4 per Dr. Luiz Ochoa    bladder tack  2012   CHOLECYSTECTOMY N/A 10/03/2015   Procedure: LAPAROSCOPIC CHOLECYSTECTOMY;  Surgeon: Mickeal Skinner, MD;  Location: [place];  Service: General;  Laterality: N/A;   ESOPHAGEAL MANOMETRY N/A 05/27/2016   Procedure: ESOPHAGEAL MANOMETRY (EM);  Surgeon: Manus Gunning, MD;  Location: [facility];  Service: Gastroenterology;  Laterality: N/A;   EXCISION OF SKIN TAG  10/03/2015  Procedure: EXCISION OF SKIN TAG;  Surgeon: Luke Aaron Kinsinger, MD;  [facility];  Service: General;;   LUMBAR DISC SURGERY  01-31-14   per Dr. Wynetta Emeryram    OOPHORECTOMY     left overy   OVARIAN CYST REMOVAL     RECTOCELE REPAIR     SINUSOTOMY  12/14/06   spinal fusion  2016/10   TONSILLECTOMY     TONSILLECTOMY     VAGINAL HYSTERECTOMY     WRIST GANGLION EXCISION Left     SOCIAL HISTORY: Social History   Socioeconomic History   Marital status: Married    Spouse name: Not on file   Number of children: 2   Years of education: Not on file   Highest education level: Not on file  Occupational History   Occupation: housewife  [employer]   Financial resource strain: Not on file   Food insecurity    Worry: Not on file    Inability: Not on file   Transportation needs    Medical: Not on file    Non-medical: Not on file  Tobacco Use   Smoking status: Former Smoker    Types: Cigarettes    Quit date: 1999    Years since quitting: 21.4   Smokeless tobacco: Never Used  Substance and Sexual Activity   Alcohol  use: No    Alcohol/week: 0.0 standard drinks   Drug use: No   Sexual activity: Yes    Birth control/protection: Surgical  Lifestyle   Physical activity    Days per week: Not on file    Minutes per session: Not on file   Stress: Not on file  Relationships   Social connections    Talks on phone: Not on file    Gets together: Not on file    Attends religious service: Not on file    Active member of club or organization: Not on file    Attends meetings of clubs or organizations: Not on file    Relationship status: Not on file   Intimate partner violence    Fear of current or ex partner: Not on file    Emotionally abused: Not on file    Physically abused: Not on file    Forced sexual activity: Not on file  Other Topics Concern   Not on file  Social History Narrative   Not on file    FAMILY HISTORY: Family History  Problem Relation Age of Onset   Fibromyalgia Mother    Diabetes Mother    Thyroid cancer Mother    Liver disease Mother    Neuropathy Mother    Cirrhosis Mother        non-alcoholic   Pulmonary embolism Mother        both lungs   Hypertension Father    Diabetes Father    Heart disease Father    Prostate cancer Father    Heart attack Father        x 2   Colon cancer Neg Hx    Other Neg Hx        pheochromocytoma   Colon polyps Neg Hx     ALLERGIES:  is allergic to gadolinium derivatives; other; and zoloft [sertraline hcl].  MEDICATIONS:  Current Outpatient Medications  Medication Sig Dispense Refill   ALPRAZolam (XANAX) 0.5 MG tablet TAKE 1 TABLET BY MOUTH THREE TIMES A DAY AS NEEDED 90 tablet 5   calcium carbonate (TUMS - DOSED IN MG ELEMENTAL CALCIUM) 500 MG chewable tablet Chew  1 tablet by mouth 2 (two) times daily as needed for indigestion or heartburn.     cetirizine (ZYRTEC) 10 MG tablet Take 10 mg by mouth daily.     cyanocobalamin (,VITAMIN B-12,) 1000 MCG/ML injection INJECT 1 ML (1,000 MCG TOTAL) INTO THE MUSCLE  EVERY 7 DAYS. 4 mL 8   cyclobenzaprine (FLEXERIL) 10 MG tablet Take 10 mg by mouth 3 (three) times daily as needed for muscle spasms.     docusate sodium (COLACE) 100 MG capsule Take 100 mg by mouth at bedtime.     famotidine (PEPCID) 40 MG tablet Take 1 tablet (40 mg total) by mouth at bedtime. 90 tablet 1   fluconazole (DIFLUCAN) 150 MG tablet TAKE 1 TABLET (150 MG TOTAL) BY MOUTH DAILY. 5 tablet 5   fluticasone (FLONASE) 50 MCG/ACT nasal spray Place 2 sprays into both nostrils every evening.      gabapentin (NEURONTIN) 300 MG capsule TAKE 1 CAPSULE BY MOUTH THREE TIMES A DAY 90 capsule 3   hydrochlorothiazide (HYDRODIURIL) 25 MG tablet TAKE 1 TABLET BY MOUTH EVERY DAY 30 tablet 5   [START ON 11/12/2018] HYDROmorphone (DILAUDID) 8 MG tablet Take 1 tablet (8 mg total) by mouth at bedtime for 30 days. 30 tablet 0   Multiple Vitamins-Minerals (MULTI-VITAMIN GUMMIES PO) Take 2 tablets by mouth daily.      nitrofurantoin, macrocrystal-monohydrate, (MACROBID) 100 MG capsule Take 100 mg by mouth daily as needed (for uti prophylaxis, after intercourse).   2   [START ON 11/12/2018] oxyCODONE (ROXICODONE) 15 MG immediate release tablet Take 1 tablet (15 mg total) by mouth every 4 (four) hours as needed for up to 30 days for pain. 180 tablet 0   pantoprazole (PROTONIX) 40 MG tablet TAKE 1 TABLET BY MOUTH TWICE A DAY **NEED PA FOR BID** 180 tablet 0   potassium chloride (KLOR-CON 10) 10 MEQ tablet Take 1 tablet (10 mEq total) by mouth daily. 90 tablet 3   promethazine (PHENERGAN) 25 MG tablet TAKE 1 TABLET BY MOUTH EVERY 6 HOURS AS NEEDED FOR NAUSEA 90 tablet 1   simethicone (MYLICON) 80 MG chewable tablet Chew 160 mg by mouth every 6 (six) hours as needed (gas pain).     SUMAtriptan (IMITREX) 100 MG tablet Take 1 tablet earliest onset of migraine.  May repeat once in 2 hours if headache persists or recurs. 10 tablet 2   SYRINGE-NEEDLE, DISP, 3 ML (B-D 3CC LUER-LOK SYR 25GX1") 25G X 1" 3 ML  MISC USE AS DIRECTED 9 each 11   Vitamin D, Ergocalciferol, (DRISDOL) 1.25 MG (50000 UT) CAPS capsule TAKE 1 CAPSULE (50,000 UNITS TOTAL) BY MOUTH EVERY 7 (SEVEN) DAYS. 12 capsule 3   XARELTO 20 MG TABS tablet TAKE 1 TABLET (20 MG TOTAL) BY MOUTH DAILY WITH SUPPER. **P/A DENIED** 30 tablet 3   No current facility-administered medications for this visit.     REVIEW OF SYSTEMS:    A 10+ POINT REVIEW OF SYSTEMS WAS OBTAINED including neurology, dermatology, psychiatry, cardiac, respiratory, lymph, extremities, GI, GU, Musculoskeletal, constitutional, breasts, reproductive, HEENT.  All pertinent positives are noted in the HPI.  All others are negative.   PHYSICAL EXAMINATION:  Vitals:   11/10/18 1452  BP: 138/89  Pulse: 96  Resp: 18  Temp: 99.1 F (37.3 C)  SpO2: 98%   Filed Weights   11/10/18 1452  Weight: 274 lb 4.8 oz (124.4 kg)   .Body mass index is 40.51 kg/m.  GENERAL:alert, in no acute distress and comfortable SKIN: no  acute rashes, no significant lesions EYES: conjunctiva are pink and non-injected, sclera anicteric OROPHARYNX: MMM, no exudates, no oropharyngeal erythema or ulceration NECK: supple, no JVD LYMPH:  no palpable lymphadenopathy in the cervical, axillary or inguinal regions LUNGS: clear to auscultation b/l with normal respiratory effort HEART: regular rate & rhythm ABDOMEN:  normoactive bowel sounds , non tender, not distended. No palpable hepatosplenomegaly.  Extremity: no pedal edema PSYCH: alert & oriented x 3 with fluent speech NEURO: no focal motor/sensory deficits   LABORATORY DATA:  I have reviewed the data as listed  . CBC Latest Ref Rng & Units 11/03/2018 07/25/2018 06/22/2018  WBC 4.0 - 10.5 K/uL 8.7 10.0 8.6  Hemoglobin 12.0 - 15.0 g/dL 16.114.4 09.614.1 04.512.9  Hematocrit 36.0 - 46.0 % 43.2 43.0 39.2  Platelets 150 - 400 K/uL 327 354 411(H)    . CMP Latest Ref Rng & Units 11/03/2018 06/22/2018 06/21/2018  Glucose 70 - 99 mg/dL 409(W127(H) 119(J119(H) 478(G113(H)    BUN 6 - 20 mg/dL 13 8 9   Creatinine 0.44 - 1.00 mg/dL 9.560.92 2.130.90 0.860.81  Sodium 135 - 145 mmol/L 142 140 139  Potassium 3.5 - 5.1 mmol/L 3.7 4.0 3.6  Chloride 98 - 111 mmol/L 104 106 101  CO2 22 - 32 mmol/L 28 25 28   Calcium 8.9 - 10.3 mg/dL 8.9 5.7(Q8.6(L) 9.1  Total Protein 6.5 - 8.1 g/dL 7.1 - 7.3  Total Bilirubin 0.3 - 1.2 mg/dL 0.3 - 0.6  Alkaline Phos 38 - 126 U/L 86 - 57  AST 15 - 41 U/L 16 - 19  ALT 0 - 44 U/L 14 - 14     RADIOGRAPHIC STUDIES: I have personally reviewed the radiological images as listed and agreed with the findings in the report. Ct Lumbar Spine Wo Contrast  Result Date: 10/18/2018 CLINICAL DATA:  Chronic low back pain. Prior lumbar surgeries. EXAM: CT LUMBAR SPINE WITHOUT CONTRAST TECHNIQUE: Multidetector CT imaging of the lumbar spine was performed without intravenous contrast administration. Multiplanar CT image reconstructions were also generated. COMPARISON:  Lumbar spine radiographs 08/25/2018, CT 05/03/2018, and MRI 04/29/2017 FINDINGS: Segmentation: Standard. Alignment: Chronic straightening of the normal lumbar lordosis. No significant listhesis. Vertebrae: No acute fracture or suspicious osseous lesion. Prior L3-4 and L4-5 posterior and interbody fusion with solid interbody arthrodesis at both levels. Interval L5-S1 anterior lumbar interbody fusion with solid arthrodesis. Unchanged appearance of bilateral L3-L5 pedicle screws. New S1 pedicle screws with mild lucency bilaterally and with the right screw slightly extending into the lateral aspect of the right S1 neural foramen without S1 nerve root mass effect. Paraspinal and other soft tissues: Unremarkable aside from postoperative changes in the posterior lumbar soft tissues. Disc levels: T12-L1: Small focus of posterior annular calcification centrally and mild facet spurring. No stenosis. L1-2: Mild facet hypertrophy and spurring without stenosis, unchanged. L2-3: Moderate facet hypertrophy and spurring without  stenosis, unchanged. L3-4: Prior wide posterior decompression and fusion. Mild endplate spurring inferiorly in the left neural foramen without evidence of significant stenosis. L4-5: Prior wide posterior decompression and fusion. No osseous spinal canal or neural foraminal stenosis. L5-S1: Interval fusion. Right foraminal endplate spurring and severe right facet hypertrophy result in mild to moderate residual right neural foraminal stenosis. No evidence of significant spinal stenosis within limitations of streak artifact. IMPRESSION: 1. Interval L5-S1 fusion with solid arthrodesis and mild-to-moderate residual right neural foraminal stenosis. 2. Solid L3-4 and L4-5 fusion without residual stenosis. Electronically Signed   By: Sebastian AcheAllen  Grady M.D.   On: 10/18/2018 12:40  ASSESSMENT & PLAN:   43 y.o. female with  1. History of Pulmonary Embolism Labs upon initial presentation from 06/22/18, PLT borderline high at 411k, other blood counts are normal 06/06/18 CTA Chest revealed Foci of incompletely obstructing pulmonary embolus in lower lobe pulmonary artery branches on the right. No more central pulmonary embolus evident. No thoracic aortic aneurysm or dissection. No right heart strain. 2.  Mild bibasilar atelectasis.  No lung edema or consolidation. 3.  No appreciable thoracic adenopathy. 4.  Hepatic steatosis. 06/07/18 US Venous BLE revealed Right: There is no evidence of deep vein thrombosis in the lower extremity. No cystic structure found in the popliteal fossa. Left: There is no evidence of deep vein thrombosis in the lower extremity. No cystic structure found in the popliteal fossa  Suspect that her surgery and lack of mobility following surgery is likely the provoking event of her blood clot.  Other iinherited clotting disorders quite unlikely given the absence of previous clots through at least 5 previous surgeries, two pregnancies and deliveries, and 6 months of PO hormonal  contraception  PLAN -Discussed pt labwork from 11/03/18; blood counts are normal, chemistries are stable. Lupus anticoagulant negative on recheck.   -Discussed the 07/25/18 Hypercoagulable work up was negative, including FVL and Prothrombin gene mutations, except for DRVVT elevated at 86.4sec in setting of anticoagulation which is not unexpected. -Discussed the recommendation to complete total of 6 months of Xarelto, which will be until roughly 12/07/18. Then switch to  aspirin for 6-12 months. -Recommend continuing PT and increasing activity levels -Recommend staying well hydrated, ambulating regularly, and wearing compression socks on long distance travel -Recommend work up for sleep apnea as coordinated with her PCP -Will see the pt back as needed   RTC with Dr Candise Che as needed   All of the patients questions were answered with apparent satisfaction. The patient knows to call the clinic with any problems, questions or concerns.  The total time spent in the appt was 20 minutes and more than 50% was on counseling and direct patient cares.    Wyvonnia Lora MD MS AAHIVMS [facility] Hematology/Oncology Physician [facility]  (Office):       [phone] (Work cell):  [phone] (Fax):           [phone]  11/10/2018 3:19 PM  I, Marcelline Mates, am acting as a scribe for Dr. Wyvonnia Lora.   .I have reviewed the above documentation for accuracy and completeness, and I agree with the above. Johney Maine MD

## 2018-11-10 ENCOUNTER — Inpatient Hospital Stay (HOSPITAL_BASED_OUTPATIENT_CLINIC_OR_DEPARTMENT_OTHER): Payer: BC Managed Care – PPO | Admitting: Hematology

## 2018-11-10 ENCOUNTER — Other Ambulatory Visit: Payer: Self-pay

## 2018-11-10 VITALS — BP 138/89 | HR 96 | Temp 99.1°F | Resp 18 | Ht 69.0 in | Wt 274.3 lb

## 2018-11-10 DIAGNOSIS — Z7901 Long term (current) use of anticoagulants: Secondary | ICD-10-CM

## 2018-11-10 DIAGNOSIS — Z86711 Personal history of pulmonary embolism: Secondary | ICD-10-CM | POA: Diagnosis not present

## 2018-11-10 DIAGNOSIS — I2699 Other pulmonary embolism without acute cor pulmonale: Secondary | ICD-10-CM

## 2018-11-11 ENCOUNTER — Telehealth: Payer: Self-pay | Admitting: Hematology

## 2018-11-11 NOTE — Telephone Encounter (Signed)
Per 6/11 los RTC with Dr Irene Limbo as needed.

## 2018-11-16 ENCOUNTER — Ambulatory Visit (INDEPENDENT_AMBULATORY_CARE_PROVIDER_SITE_OTHER): Payer: BC Managed Care – PPO | Admitting: Licensed Clinical Social Worker

## 2018-11-16 DIAGNOSIS — F3341 Major depressive disorder, recurrent, in partial remission: Secondary | ICD-10-CM | POA: Diagnosis not present

## 2018-11-16 DIAGNOSIS — M5136 Other intervertebral disc degeneration, lumbar region: Secondary | ICD-10-CM | POA: Diagnosis not present

## 2018-11-21 ENCOUNTER — Other Ambulatory Visit: Payer: Self-pay

## 2018-11-21 ENCOUNTER — Encounter: Payer: Self-pay | Admitting: Internal Medicine

## 2018-11-21 ENCOUNTER — Telehealth (INDEPENDENT_AMBULATORY_CARE_PROVIDER_SITE_OTHER): Payer: BC Managed Care – PPO | Admitting: Internal Medicine

## 2018-11-21 VITALS — BP 138/88 | HR 85 | Ht 69.0 in | Wt 270.0 lb

## 2018-11-21 DIAGNOSIS — I951 Orthostatic hypotension: Secondary | ICD-10-CM

## 2018-11-21 DIAGNOSIS — R000 Tachycardia, unspecified: Secondary | ICD-10-CM

## 2018-11-21 NOTE — Progress Notes (Signed)
Electrophysiology TeleHealth Note   Due to national recommendations of social distancing due to COVID 19, an audio/video telehealth visit is felt to be most appropriate for this patient at this time.  See MyChart message from today for the patient's consent to telehealth for [name].   Date:  11/21/2018   ID:  [name], DOB 04/24/1976, MRN [id number]  Location: patient's home  Provider location: [address], [place]  Evaluation Performed: Follow-up visit  PCP:  [name], Hannah A, MD  Cardiologist:     Electrophysiologist:  SK   Chief Complaint:  Sinus tachycardia  History of Present Illness:    [name] is [name] 43 y.o. female who presents via audio/video conferencing for [name] telehealth visit today.  Since last being seen with Telehealth visit for sinus tach, HTN and orthostatic LH she has been upright and her heart rates have been less rapid, lightheadedness less severe, and much to her pleasure her alopecia is improving    Heart rates with exertion are in the 130 range.  Blood pressure in the 130 range.  She is continue to work on physical therapy.  Unfortunately, her mother is dying of hepatic failure.  She and her mother are very close and is been extremely emotional disruptive.  (No big surprise) she feels like she is having panic attacks.  Her PCP has prescribed benzodiazepines  Back pain is worse particularly with going to hospital    Interval Pulm emboli 1/20  Now on anticoagulation without clinically over bleeding   No symptoms worried for COVID  Past Medical History:  Diagnosis Date   Allergy    Anxiety    Arthritis    B12 deficiency    Chronic narcotic use    Depression    not currently    Dysrhythmia    hx tachy-was from medication nortriptylline   Esophagitis    rx   Fibromyalgia    GERD (gastroesophageal reflux disease)    History of echocardiogram    Echo 4/18: Mild concentric LVH, vigorous LVF, EF 65-70,  normal wall motion, grade 1 diastolic dysfunction   History of kidney stones    Hyperlipidemia    Intervertebral disc protrusion 01/11/2014   Migraine    Obesity (BMI 35.0-39.9 without comorbidity)    Peripheral neuropathy    Polycystic ovaries    Pulmonary embolus (HCC)    Spinal stenosis, lumbar    Vitamin D deficiency     Past Surgical History:  Procedure Laterality Date   5424 HOUR PH STUDY N/[name] 05/27/2016   Procedure: 24 HOUR PH STUDY;  Surgeon: Ruffin FrederickSteven Paul Armbruster, MD;  Location: [facility];  Service: Gastroenterology;  Laterality: N/[name];   ABDOMINAL EXPOSURE N/[name] 05/04/2018   Procedure: ABDOMINAL EXPOSURE;  Surgeon: Larina EarthlyEarly, Todd F, MD;  [facility];  Service: Vascular;  Laterality: N/[name];   ANTERIOR LUMBAR FUSION N/[name] 05/04/2018   Procedure: Lumbar Five Sacral One Anterior lumbar interbody fusion;  Surgeon: Donalee Citrinram, Gary, MD;  [facility];  Service: Neurosurgery;  Laterality: N/[name];  Lumbar Five Sacral One Anterior lumbar interbody fusion   APPENDECTOMY     BACK SURGERY  2012   left laminectomy at L3-4 per Dr. Phoebe PerchHirsch    bladder tack  2012   CHOLECYSTECTOMY N/[name] 10/03/2015   Procedure: LAPAROSCOPIC CHOLECYSTECTOMY;  Surgeon: Rodman PickleLuke Aaron Kinsinger, MD;  [facility];  Service: General;  Laterality: N/[name];   ESOPHAGEAL MANOMETRY N/[name] 05/27/2016   Procedure: ESOPHAGEAL MANOMETRY (EM);  Surgeon:  Ruffin FrederickSteven Paul Armbruster, MD;  [facility];  Service: Gastroenterology;  Laterality: N/[name];   EXCISION OF SKIN TAG  10/03/2015   Procedure: EXCISION OF SKIN TAG;  Surgeon: De BlanchLuke Aaron Kinsinger, MD;  Location: [facility];  Service: General;;   LUMBAR DISC SURGERY  01-31-14   per Dr. Wynetta Emeryram    OOPHORECTOMY     left overy   OVARIAN CYST REMOVAL     RECTOCELE REPAIR     SINUSOTOMY  12/14/06   spinal fusion  2016/10   TONSILLECTOMY     TONSILLECTOMY     VAGINAL HYSTERECTOMY     WRIST GANGLION EXCISION Left     Current Outpatient Medications  Medication Sig Dispense  Refill   ALPRAZolam (XANAX) 0.5 MG tablet TAKE 1 TABLET BY MOUTH THREE TIMES [name] DAY AS NEEDED 90 tablet 5   calcium carbonate (TUMS - DOSED IN MG ELEMENTAL CALCIUM) 500 MG chewable tablet Chew 1 tablet by mouth 2 (two) times daily as needed for indigestion or heartburn.     cetirizine (ZYRTEC) 10 MG tablet Take 10 mg by mouth daily.     cyanocobalamin (,VITAMIN B-12,) 1000 MCG/ML injection INJECT 1 ML (1,000 MCG TOTAL) INTO THE MUSCLE EVERY 7 DAYS. 4 mL 8   cyclobenzaprine (FLEXERIL) 10 MG tablet Take 10 mg by mouth 3 (three) times daily as needed for muscle spasms.     docusate sodium (COLACE) 100 MG capsule Take 100 mg by mouth at bedtime.     famotidine (PEPCID) 40 MG tablet Take 1 tablet (40 mg total) by mouth at bedtime. 90 tablet 1   fluconazole (DIFLUCAN) 150 MG tablet TAKE 1 TABLET (150 MG TOTAL) BY MOUTH DAILY. 5 tablet 5   fluticasone (FLONASE) 50 MCG/ACT nasal spray Place 2 sprays into both nostrils every evening.      gabapentin (NEURONTIN) 300 MG capsule TAKE 1 CAPSULE BY MOUTH THREE TIMES [name] DAY 90 capsule 3   hydrochlorothiazide (HYDRODIURIL) 25 MG tablet TAKE 1 TABLET BY MOUTH EVERY DAY 30 tablet 5   HYDROmorphone (DILAUDID) 8 MG tablet Take 1 tablet (8 mg total) by mouth at bedtime for 30 days. 30 tablet 0   Multiple Vitamins-Minerals (MULTI-VITAMIN GUMMIES PO) Take 2 tablets by mouth daily.      nitrofurantoin, macrocrystal-monohydrate, (MACROBID) 100 MG capsule Take 100 mg by mouth daily as needed (for uti prophylaxis, after intercourse).   2   oxyCODONE (ROXICODONE) 15 MG immediate release tablet Take 1 tablet (15 mg total) by mouth every 4 (four) hours as needed for up to 30 days for pain. 180 tablet 0   pantoprazole (PROTONIX) 40 MG tablet TAKE 1 TABLET BY MOUTH TWICE [name] DAY **NEED PA FOR BID** 180 tablet 0   potassium chloride (KLOR-CON 10) 10 MEQ tablet Take 1 tablet (10 mEq total) by mouth daily. 90 tablet 3   promethazine (PHENERGAN) 25 MG tablet TAKE 1  TABLET BY MOUTH EVERY 6 HOURS AS NEEDED FOR NAUSEA 90 tablet 1   simethicone (MYLICON) 80 MG chewable tablet Chew 160 mg by mouth every 6 (six) hours as needed (gas pain).     SUMAtriptan (IMITREX) 100 MG tablet Take 1 tablet earliest onset of migraine.  May repeat once in 2 hours if headache persists or recurs. 10 tablet 2   SYRINGE-NEEDLE, DISP, 3 ML (B-D 3CC LUER-LOK SYR 25GX1") 25G X 1" 3 ML MISC USE AS DIRECTED 9 each 11   Vitamin D, Ergocalciferol, (DRISDOL) 1.25 MG (50000 UT) CAPS capsule TAKE 1 CAPSULE (  50,000 UNITS TOTAL) BY MOUTH EVERY 7 (SEVEN) DAYS. 12 capsule 3   XARELTO 20 MG TABS tablet TAKE 1 TABLET (20 MG TOTAL) BY MOUTH DAILY WITH SUPPER. **P/[name] DENIED** 30 tablet 3   No current facility-administered medications for this visit.     Allergies:   Gadolinium derivatives, Other, and Zoloft [sertraline hcl]   Social History:  The patient  reports that she quit smoking about 21 years ago. Her smoking use included cigarettes. She has never used smokeless tobacco. She reports that she does not drink alcohol or use drugs.   Family History:  The patient's   family history includes Cirrhosis in her mother; Diabetes in her father and mother; Fibromyalgia in her mother; Heart attack in her father; Heart disease in her father; Hypertension in her father; Liver disease in her mother; Neuropathy in her mother; Prostate cancer in her father; Pulmonary embolism in her mother; Thyroid cancer in her mother.   ROS:  Please see the history of present illness.   All other systems are personally reviewed and negative.    Exam:    Vital Signs:  BP 138/88    Pulse 85    Ht 5\' 9"  (1.753 m)    Wt 270 lb (122.5 kg)    LMP 12/13/1999 Comment: single oophorectomy//[name].c.   BMI 39.87 kg/m   Well appearing, alert and conversant, regular work of breathing,  good skin color Eyes- anicteric, neuro- grossly intact, skin- no apparent rash or lesions or cyanosis, mouth- oral mucosa is  pink tearful  Labs/Other Tests and Data Reviewed:    Recent Labs: 06/21/2018: B Natriuretic Peptide 16.8 11/03/2018: ALT 14; BUN 13; Creatinine 0.92; Hemoglobin 14.4; Platelet Count 327; Potassium 3.7; Sodium 142   Wt Readings from Last 3 Encounters:  11/10/18 274 lb 4.8 oz (124.4 kg)  10/18/18 268 lb (121.6 kg)  09/19/18 282 lb (127.9 kg)          ASSESSMENT & PLAN:    Tachycardia-sinus  Hypertension -orthostatic  Orthostatic intolerance  Pulmonary embolism Post operative spinal fusion 12/19  Dyspnea on exertion  Alopecia with beta-blockers ? verapamil  Stress/depression   Efforts to create orthostatic stress i.e. sitting up have been associated with surprisingly beneficial results  She is no longer on verapamil because of the alopecia.   Tachycardia is much better.  Hopefully with more physical therapy it will continue to improve   Depression remains an issue- now on xanax per PCP  Encouraged her in the caring for her dying mother       COVID 59 screen The patient denies symptoms of COVID 19 at this time.  The importance of social distancing was discussed today.  Follow-up:  51m       Current medicines are reviewed at length with the patient today.   The patient has concerns regarding her medicines.  The following changes were made today:  none Labs/ tests ordered today include: .none  No orders of the defined types were placed in this encounter.     Patient Risk:  after full review of this patients clinical status, I feel that they are at moderate*  risk at this time.  Today, I have spent  15 minutes with the patient with telehealth technology discussing the above.  We spent more than 50% of our >25 min visit in face to face counseling regarding the above   Signed, Virl Axe, MD  11/21/2018 10:50 AM     [address]  [place] [address] [phone] (office) [phone] (fax)

## 2018-11-23 ENCOUNTER — Ambulatory Visit (INDEPENDENT_AMBULATORY_CARE_PROVIDER_SITE_OTHER): Payer: BC Managed Care – PPO | Admitting: Nurse Practitioner

## 2018-11-23 ENCOUNTER — Encounter: Payer: Self-pay | Admitting: Nurse Practitioner

## 2018-11-23 VITALS — BP 137/88 | Ht 69.5 in | Wt 270.0 lb

## 2018-11-23 DIAGNOSIS — K219 Gastro-esophageal reflux disease without esophagitis: Secondary | ICD-10-CM

## 2018-11-23 MED ORDER — PANTOPRAZOLE SODIUM 40 MG PO TBEC: 40.0000 mg | DELAYED_RELEASE_TABLET | ORAL | 3 refills | Status: DC

## 2018-11-23 NOTE — Patient Instructions (Signed)
If you are age 44 or older, your body mass index should be between 23-30. Your Body mass index is 39.3 kg/m. If this is out of the aforementioned range listed, please consider follow up with your Primary Care Provider.  If you are age 66 or younger, your body mass index should be between 19-25. Your Body mass index is 39.3 kg/m. If this is out of the aformentioned range listed, please consider follow up with your Primary Care Provider.   We have sent the following medications to your pharmacy for you to pick up at your convenience: Pantoprazole 40 mg - take one 30 minutes before breakfast.  Thank you for choosing me and [facility].   Tye Savoy, NP

## 2018-11-23 NOTE — Progress Notes (Signed)
Chief Complaint:  protonix refill     IMPRESSION and PLAN:    1. 43 yo female with longstanding GERD. Overall symptoms controlled with protonix in am / Pepcid Q HS. Needs refill on protonix.  -refill Protonix 40 mg Q am #90 with 3 refills  2. Dysphagia, improved but not resolved with PPI / H2 blocker combination. She feels anxiety contributes to dysphagia. Had manometry, Diltiazem didn't help.   HPI:     Patient is a 43 year old female known to Dr. Adela LankArmbruster for hx of GERD, abdominal pain, mild gastritis, benign gastric polyps, and hepatic steatosis.  She last saw us November 2017 that was for evaluation of abdominal pain status post cholecystectomy.  The pain was to be abdominal wall pain and  treated with topical capsaicin.  She also complained of GERD symptoms with globus despite twice daily PPI. Esophageal manometry and 24 pH impedance testing on PPI was done. Patient treated with diltiazem which didn't help. She has since gone back and forth between once daily and twice daily dosing PPI.    Currently GERD symptoms overall controlled with Protonix in the a.m. and Pepcid at bedtime (PCP started a few months ago).  She admits to forgetting to take the Pepcid at bedtime sometimes.  She does say this regimen helps the pain located near cholecystectomy site which we felt was abdominal wall pain.   Review of systems:     No chest pain, no SOB, no fevers, no urinary sx   Past Medical History:  Diagnosis Date  . Allergy   . Anxiety   . Arthritis   . B12 deficiency   . Chronic narcotic use   . Depression    not currently   . Dysrhythmia    hx tachy-was from medication nortriptylline  . Esophagitis    rx  . Fibromyalgia   . GERD (gastroesophageal reflux disease)   . History of echocardiogram    Echo 4/18: Mild concentric LVH, vigorous LVF, EF 65-70, normal wall motion, grade 1 diastolic dysfunction  . History of kidney stones   . Hyperlipidemia   . Intervertebral disc  protrusion 01/11/2014  . Migraine   . Obesity (BMI 35.0-39.9 without comorbidity)   . Peripheral neuropathy   . Polycystic ovaries   . Pulmonary embolus (HCC)   . Spinal stenosis, lumbar   . Vitamin D deficiency     Patient's surgical history, family medical history, social history, medications and allergies were all reviewed in Epic   Serum creatinine: 0.92 mg/dL 16/03/9605/04/20 [id number] Estimated creatinine clearance: 110.1 mL/min  Current Outpatient Medications  Medication Sig Dispense Refill  . ALPRAZolam (XANAX) 0.5 MG tablet TAKE 1 TABLET BY MOUTH THREE TIMES A DAY AS NEEDED 90 tablet 5  . calcium carbonate (TUMS - DOSED IN MG ELEMENTAL CALCIUM) 500 MG chewable tablet Chew 1 tablet by mouth 2 (two) times daily as needed for indigestion or heartburn.    . cetirizine (ZYRTEC) 10 MG tablet Take 10 mg by mouth daily.    . cyanocobalamin (,VITAMIN B-12,) 1000 MCG/ML injection INJECT 1 ML (1,000 MCG TOTAL) INTO THE MUSCLE EVERY 7 DAYS. 4 mL 8  . cyclobenzaprine (FLEXERIL) 10 MG tablet Take 10 mg by mouth 3 (three) times daily as needed for muscle spasms.    Marland Kitchen. docusate sodium (COLACE) 100 MG capsule Take 100 mg by mouth at bedtime as needed.     . famotidine (PEPCID) 40 MG tablet Take 1 tablet (40 mg total) by mouth  at bedtime. 90 tablet 1  . fluticasone (FLONASE) 50 MCG/ACT nasal spray Place 2 sprays into both nostrils every evening.     . gabapentin (NEURONTIN) 300 MG capsule TAKE 1 CAPSULE BY MOUTH THREE TIMES A DAY 90 capsule 3  . hydrochlorothiazide (HYDRODIURIL) 25 MG tablet TAKE 1 TABLET BY MOUTH EVERY DAY 30 tablet 5  . HYDROmorphone (DILAUDID) 8 MG tablet Take 8 mg by mouth every 4 (four) hours as needed for severe pain.    . Multiple Vitamins-Minerals (MULTI-VITAMIN GUMMIES PO) Take 2 tablets by mouth daily.     . nitrofurantoin, macrocrystal-monohydrate, (MACROBID) 100 MG capsule Take 100 mg by mouth daily as needed (for uti prophylaxis, after intercourse).   2  . oxyCODONE (ROXICODONE)  15 MG immediate release tablet Take 1 tablet (15 mg total) by mouth every 4 (four) hours as needed for up to 30 days for pain. 180 tablet 0  . pantoprazole (PROTONIX) 40 MG tablet Take 40 mg by mouth daily.    . potassium chloride (K-DUR) 10 MEQ tablet Take 10 mEq by mouth once a week.    . promethazine (PHENERGAN) 25 MG tablet TAKE 1 TABLET BY MOUTH EVERY 6 HOURS AS NEEDED FOR NAUSEA 90 tablet 1  . rivaroxaban (XARELTO) 20 MG TABS tablet Take 20 mg by mouth daily with supper.    . simethicone (MYLICON) 80 MG chewable tablet Chew 160 mg by mouth every 6 (six) hours as needed (gas pain).    . SUMAtriptan (IMITREX) 100 MG tablet Take 1 tablet earliest onset of migraine.  May repeat once in 2 hours if headache persists or recurs. 10 tablet 2  . SYRINGE-NEEDLE, DISP, 3 ML (B-D 3CC LUER-LOK SYR 25GX1") 25G X 1" 3 ML MISC USE AS DIRECTED 9 each 11  . Vitamin D, Ergocalciferol, (DRISDOL) 1.25 MG (50000 UT) CAPS capsule TAKE 1 CAPSULE (50,000 UNITS TOTAL) BY MOUTH EVERY 7 (SEVEN) DAYS. 12 capsule 3   No current facility-administered medications for this visit.     Physical Exam:    No physical exam  Tye Savoy , NP 11/23/2018, 10:29 AM

## 2018-11-28 ENCOUNTER — Encounter: Payer: Self-pay | Admitting: Nurse Practitioner

## 2018-11-30 ENCOUNTER — Ambulatory Visit (INDEPENDENT_AMBULATORY_CARE_PROVIDER_SITE_OTHER): Payer: BC Managed Care – PPO | Admitting: Licensed Clinical Social Worker

## 2018-11-30 DIAGNOSIS — F3341 Major depressive disorder, recurrent, in partial remission: Secondary | ICD-10-CM

## 2018-12-02 ENCOUNTER — Other Ambulatory Visit: Payer: Self-pay | Admitting: Family Medicine

## 2018-12-07 ENCOUNTER — Other Ambulatory Visit: Payer: Self-pay | Admitting: Family Medicine

## 2018-12-07 ENCOUNTER — Ambulatory Visit (INDEPENDENT_AMBULATORY_CARE_PROVIDER_SITE_OTHER): Payer: BC Managed Care – PPO | Admitting: Family Medicine

## 2018-12-07 ENCOUNTER — Other Ambulatory Visit: Payer: Self-pay

## 2018-12-07 ENCOUNTER — Encounter: Payer: Self-pay | Admitting: Family Medicine

## 2018-12-07 DIAGNOSIS — G609 Hereditary and idiopathic neuropathy, unspecified: Secondary | ICD-10-CM

## 2018-12-07 DIAGNOSIS — M48061 Spinal stenosis, lumbar region without neurogenic claudication: Secondary | ICD-10-CM

## 2018-12-07 DIAGNOSIS — M545 Low back pain, unspecified: Secondary | ICD-10-CM

## 2018-12-07 DIAGNOSIS — G8929 Other chronic pain: Secondary | ICD-10-CM

## 2018-12-07 DIAGNOSIS — F1190 Opioid use, unspecified, uncomplicated: Secondary | ICD-10-CM | POA: Diagnosis not present

## 2018-12-07 MED ORDER — PRAMIPEXOLE DIHYDROCHLORIDE 1 MG PO TABS: ORAL_TABLET | ORAL | 2 refills | Status: DC

## 2018-12-07 MED ORDER — OXYCODONE HCL 15 MG PO TABS: 15.0000 mg | ORAL_TABLET | ORAL | 0 refills | Status: DC | PRN

## 2018-12-07 MED ORDER — HYDROMORPHONE HCL 8 MG PO TABS: 8.0000 mg | ORAL_TABLET | ORAL | 0 refills | Status: DC | PRN

## 2018-12-07 MED ORDER — GABAPENTIN 100 MG PO CAPS: 100.0000 mg | ORAL_CAPSULE | Freq: Three times a day (TID) | ORAL | 5 refills | Status: DC

## 2018-12-07 NOTE — Progress Notes (Signed)
Subjective:    Patient ID: [name], female    DOB: 03/30/1976, 43 y.o.   MRN: [id number]  HPI Virtual Visit via Video Note  I connected with the patient on 12/07/18 at  2:30 PM EDT by a video enabled telemedicine application and verified that I am speaking with the correct person using two identifiers.  Location patient: home Location provider:work or home office Persons participating in the virtual visit: patient, provider  I discussed the limitations of evaluation and management by telemedicine and the availability of in person appointments. The patient expressed understanding and agreed to proceed.   HPI: Here for pain management. She is still struggling with her back pain unfortunately. She has been going to PT but this does not seem to help her. She knows she is severely deconditioned and she used to enjoy going to a pool for pool therapy. This was closed for several months due to the pandemic, but now it has opened up for use by appointment only. She has not tried to contact them yet. She asks if she can take some extra Gabapentin on an as needed basis (she currently takes 300 mg TID). She also mentions having uncontrollable twitches of the muscles in her legs and feet, especially at night.  Indication for chronic opioid: low back pain Medication and dose: Oxycodone 15 mg and Dilaudid 8 mg  # pills per month: 180 and 30 Last UDS date: 03-09-18 Opioid Treatment Agreement signed (Y/N): 03-09-18 Opioid Treatment Agreement last reviewed with patient:  12-07-18 [facility] reviewed this encounter (include red flags):  12-07-18    ROS: See pertinent positives and negatives per HPI.  Past Medical History:  Diagnosis Date  . Allergy   . Anxiety   . Arthritis   . B12 deficiency   . Chronic narcotic use   . Depression    not currently   . Dysrhythmia    hx tachy-was from medication nortriptylline  . Esophagitis    rx  . Fibromyalgia   . GERD (gastroesophageal reflux disease)    . History of echocardiogram    Echo 4/18: Mild concentric LVH, vigorous LVF, EF 65-70, normal wall motion, grade 1 diastolic dysfunction  . History of kidney stones   . Hyperlipidemia   . Intervertebral disc protrusion 01/11/2014  . Migraine   . Obesity (BMI 35.0-39.9 without comorbidity)   . Peripheral neuropathy   . Polycystic ovaries   . Pulmonary embolus ([place])   . Spinal stenosis, lumbar   . Vitamin D deficiency     Past Surgical History:  Procedure Laterality Date  . [place] STUDY N/A 05/27/2016   Procedure: [place] STUDY;  Surgeon: Manus Gunning, MD;  Location: [facility];  Service: Gastroenterology;  Laterality: N/A;  . ABDOMINAL EXPOSURE N/A 05/04/2018   Procedure: ABDOMINAL EXPOSURE;  Surgeon: Rosetta Posner, MD;  Location: [facility];  Service: Vascular;  Laterality: N/A;  . ANTERIOR LUMBAR FUSION N/A 05/04/2018   Procedure: Lumbar Five Sacral One Anterior lumbar interbody fusion;  Surgeon: Kary Kos, MD;  Location: [facility];  Service: Neurosurgery;  Laterality: N/A;  Lumbar Five Sacral One Anterior lumbar interbody fusion  . APPENDECTOMY    . BACK SURGERY  2012   left laminectomy at L3-4 per Dr. Luiz Ochoa   . bladder tack  2012  . CHOLECYSTECTOMY N/A 10/03/2015   Procedure: LAPAROSCOPIC CHOLECYSTECTOMY;  Surgeon: Mickeal Skinner, MD;  Location: [facility];  Service: General;  Laterality: N/A;  . ESOPHAGEAL MANOMETRY  N/A 05/27/2016   Procedure: ESOPHAGEAL MANOMETRY (EM);  Surgeon: Ruffin FrederickSteven Paul Armbruster, MD;  [facility];  Service: Gastroenterology;  Laterality: N/A;  . EXCISION OF SKIN TAG  10/03/2015   Procedure: EXCISION OF SKIN TAG;  Surgeon: De BlanchLuke Aaron Kinsinger, MD;  Location: [facility];  Service: General;;  . LUMBAR DISC SURGERY  01-31-14   per Dr. Wynetta Emeryram   . OOPHORECTOMY     left overy  . OVARIAN CYST REMOVAL    . RECTOCELE REPAIR    . SINUSOTOMY  12/14/06  . spinal fusion  2016/10  . TONSILLECTOMY    . TONSILLECTOMY    . VAGINAL HYSTERECTOMY    . WRIST  GANGLION EXCISION Left     Family History  Problem Relation Age of Onset  . Fibromyalgia Mother   . Diabetes Mother   . Thyroid cancer Mother   . Liver disease Mother   . Neuropathy Mother   . Cirrhosis Mother        non-alcoholic  . Pulmonary embolism Mother        both lungs  . Heart failure Mother   . Hypertension Father   . Diabetes Father   . Heart disease Father   . Prostate cancer Father   . Heart attack Father        x 2  . Colon cancer Neg Hx   . Other Neg Hx        pheochromocytoma  . Colon polyps Neg Hx      Current Outpatient Medications:  .  ALPRAZolam (XANAX) 0.5 MG tablet, TAKE 1 TABLET BY MOUTH THREE TIMES A DAY AS NEEDED, Disp: 90 tablet, Rfl: 5 .  calcium carbonate (TUMS - DOSED IN MG ELEMENTAL CALCIUM) 500 MG chewable tablet, Chew 1 tablet by mouth 2 (two) times daily as needed for indigestion or heartburn., Disp: , Rfl:  .  cetirizine (ZYRTEC) 10 MG tablet, Take 10 mg by mouth daily., Disp: , Rfl:  .  cyanocobalamin (,VITAMIN B-12,) 1000 MCG/ML injection, INJECT 1 ML (1,000 MCG TOTAL) INTO THE MUSCLE EVERY 7 DAYS., Disp: 4 mL, Rfl: 8 .  cyclobenzaprine (FLEXERIL) 10 MG tablet, Take 10 mg by mouth 3 (three) times daily as needed for muscle spasms., Disp: , Rfl:  .  docusate sodium (COLACE) 100 MG capsule, Take 100 mg by mouth at bedtime as needed. , Disp: , Rfl:  .  famotidine (PEPCID) 40 MG tablet, Take 1 tablet (40 mg total) by mouth at bedtime., Disp: 90 tablet, Rfl: 1 .  fluticasone (FLONASE) 50 MCG/ACT nasal spray, Place 2 sprays into both nostrils every evening. , Disp: , Rfl:  .  gabapentin (NEURONTIN) 300 MG capsule, TAKE 1 CAPSULE BY MOUTH THREE TIMES A DAY, Disp: 90 capsule, Rfl: 3 .  hydrochlorothiazide (HYDRODIURIL) 25 MG tablet, TAKE 1 TABLET BY MOUTH EVERY DAY, Disp: 30 tablet, Rfl: 5 .  [START ON 02/12/2019] HYDROmorphone (DILAUDID) 8 MG tablet, Take 1 tablet (8 mg total) by mouth every 4 (four) hours as needed for severe pain., Disp: 30 tablet,  Rfl: 0 .  Multiple Vitamins-Minerals (MULTI-VITAMIN GUMMIES PO), Take 2 tablets by mouth daily. , Disp: , Rfl:  .  nitrofurantoin, macrocrystal-monohydrate, (MACROBID) 100 MG capsule, Take 100 mg by mouth daily as needed (for uti prophylaxis, after intercourse). , Disp: , Rfl: 2 .  [START ON 02/12/2019] oxyCODONE (ROXICODONE) 15 MG immediate release tablet, Take 1 tablet (15 mg total) by mouth every 4 (four) hours as needed for pain., Disp: 180 tablet,  Rfl: 0 .  pantoprazole (PROTONIX) 40 MG tablet, Take 1 tablet (40 mg total) by mouth every morning. Take 30 minutes before breakfast., Disp: 90 tablet, Rfl: 3 .  potassium chloride (K-DUR) 10 MEQ tablet, Take 10 mEq by mouth once a week., Disp: , Rfl:  .  promethazine (PHENERGAN) 25 MG tablet, TAKE 1 TABLET BY MOUTH EVERY 6 HOURS AS NEEDED FOR NAUSEA, Disp: 90 tablet, Rfl: 1 .  simethicone (MYLICON) 80 MG chewable tablet, Chew 160 mg by mouth every 6 (six) hours as needed (gas pain)., Disp: , Rfl:  .  SUMAtriptan (IMITREX) 100 MG tablet, Take 1 tablet earliest onset of migraine.  May repeat once in 2 hours if headache persists or recurs., Disp: 10 tablet, Rfl: 2 .  SYRINGE-NEEDLE, DISP, 3 ML (B-D 3CC LUER-LOK SYR 25GX1") 25G X 1" 3 ML MISC, USE AS DIRECTED, Disp: 9 each, Rfl: 11 .  Vitamin D, Ergocalciferol, (DRISDOL) 1.25 MG (50000 UT) CAPS capsule, TAKE 1 CAPSULE (50,000 UNITS TOTAL) BY MOUTH EVERY 7 (SEVEN) DAYS., Disp: 12 capsule, Rfl: 3 .  XARELTO 20 MG TABS tablet, TAKE 1 TABLET (20 MG TOTAL) BY MOUTH DAILY WITH SUPPER. **P/A DENIED**, Disp: 30 tablet, Rfl: 3 .  gabapentin (NEURONTIN) 100 MG capsule, Take 1 capsule (100 mg total) by mouth 3 (three) times daily., Disp: 90 capsule, Rfl: 5 .  pramipexole (MIRAPEX) 1 MG tablet, Take 1 or 2 tabs at bedtime, Disp: 60 tablet, Rfl: 2  EXAM:  VITALS per patient if applicable:  GENERAL: alert, oriented, appears well and in no acute distress  HEENT: atraumatic, conjunttiva clear, no obvious  abnormalities on inspection of external nose and ears  NECK: normal movements of the head and neck  LUNGS: on inspection no signs of respiratory distress, breathing rate appears normal, no obvious gross SOB, gasping or wheezing  CV: no obvious cyanosis  MS: moves all visible extremities without noticeable abnormality  PSYCH/NEURO: pleasant and cooperative, no obvious depression or anxiety, speech and thought processing grossly intact  ASSESSMENT AND PLAN: Pain management. Her pain meds were refilled. In addition she can take an extra 100 mg of Gabapentin TID if needed. She will also try Pramipexole 1 or 2 mg at bedtime to help with leg spasms. She is scheduled to follow up with Dr. Wynetta Emeryram in a few weeks. I also encouraged her to sign up for the pool therapy slots as often as she can get there.  Gershon CraneStephen Fry, MD  Discussed the following assessment and plan:  No diagnosis found.     I discussed the assessment and treatment plan with the patient. The patient was provided an opportunity to ask questions and all were answered. The patient agreed with the plan and demonstrated an understanding of the instructions.   The patient was advised to call back or seek an in-person evaluation if the symptoms worsen or if the condition fails to improve as anticipated.     Review of Systems     Objective:   Physical Exam        Assessment & Plan:

## 2018-12-09 ENCOUNTER — Encounter: Payer: Self-pay | Admitting: Family Medicine

## 2018-12-09 NOTE — Telephone Encounter (Signed)
Dr. Fry please advise. Thanks  

## 2018-12-12 ENCOUNTER — Telehealth: Payer: Self-pay | Admitting: Family Medicine

## 2018-12-12 NOTE — Telephone Encounter (Signed)
Copied from [place] [id number]. Topic: Quick Communication - Rx Refill/Question >> Dec 12, 2018  2:58 PM Nils Flack, Marland Kitchen wrote: Medication: gabapentin (NEURONTIN) 100 MG capsule, gabapentin (NEURONTIN) 300 MG capsule, oxyCODONE (ROXICODONE) 15 MG immediate release tablet, and something for restless leg syndrome  Has the patient contacted their pharmacy? Yes.   (Agent: If no, request that the patient contact the pharmacy for the refill.) (Agent: If yes, when and what did the pharmacy advise?)  Preferred Pharm[facility] (with phone number or [facility]  Pt said that meds were not called in after 12/07/18 virtual visit. Pharm says they did not receive them Please re-send  Pt will be out of meds tomorrow  Agent: Please be advised that RX refills may take up to 3 business days. We ask that you follow-up with your pharmacy.

## 2018-12-12 NOTE — Telephone Encounter (Signed)
Dr. Sarajane Jews can you please resend medications

## 2018-12-13 ENCOUNTER — Other Ambulatory Visit: Payer: Self-pay | Admitting: Family Medicine

## 2018-12-13 MED ORDER — GABAPENTIN 300 MG PO CAPS: ORAL_CAPSULE | ORAL | 5 refills | Status: DC

## 2018-12-13 NOTE — Telephone Encounter (Signed)
[facility]

## 2018-12-13 NOTE — Telephone Encounter (Signed)
The Gabapentin was sent in

## 2018-12-13 NOTE — Telephone Encounter (Signed)
Spoke with pharmacy they received the rx for oxycodone, but dd not receive either rx for gabapentin.

## 2018-12-13 NOTE — Telephone Encounter (Signed)
I already refilled all these

## 2018-12-14 ENCOUNTER — Ambulatory Visit (INDEPENDENT_AMBULATORY_CARE_PROVIDER_SITE_OTHER): Payer: BC Managed Care – PPO | Admitting: Licensed Clinical Social Worker

## 2018-12-14 DIAGNOSIS — M5136 Other intervertebral disc degeneration, lumbar region: Secondary | ICD-10-CM | POA: Diagnosis not present

## 2018-12-14 DIAGNOSIS — R262 Difficulty in walking, not elsewhere classified: Secondary | ICD-10-CM | POA: Diagnosis not present

## 2018-12-14 DIAGNOSIS — F3341 Major depressive disorder, recurrent, in partial remission: Secondary | ICD-10-CM | POA: Diagnosis not present

## 2018-12-14 DIAGNOSIS — M545 Low back pain: Secondary | ICD-10-CM | POA: Diagnosis not present

## 2018-12-14 DIAGNOSIS — R293 Abnormal posture: Secondary | ICD-10-CM | POA: Diagnosis not present

## 2018-12-15 ENCOUNTER — Other Ambulatory Visit: Payer: Self-pay | Admitting: Family Medicine

## 2018-12-15 DIAGNOSIS — Z20822 Contact with and (suspected) exposure to covid-19: Secondary | ICD-10-CM

## 2018-12-16 ENCOUNTER — Telehealth: Payer: Self-pay | Admitting: Family Medicine

## 2018-12-16 NOTE — Telephone Encounter (Signed)
Pt state that she just had a virtual appointment with you on Wednesday not for the Sinus pressure that she is experiencing.  Pt would like to see if Dr. Sarajane Jews could call her an antibiotic in for the sinus issue that she gets twice a year.  [facility] [address]

## 2018-12-17 ENCOUNTER — Ambulatory Visit (INDEPENDENT_AMBULATORY_CARE_PROVIDER_SITE_OTHER): Payer: BC Managed Care – PPO | Admitting: Family Medicine

## 2018-12-17 ENCOUNTER — Other Ambulatory Visit: Payer: Self-pay

## 2018-12-17 ENCOUNTER — Encounter: Payer: Self-pay | Admitting: Family Medicine

## 2018-12-17 DIAGNOSIS — J329 Chronic sinusitis, unspecified: Secondary | ICD-10-CM

## 2018-12-17 DIAGNOSIS — Z20828 Contact with and (suspected) exposure to other viral communicable diseases: Secondary | ICD-10-CM | POA: Diagnosis not present

## 2018-12-17 DIAGNOSIS — B9689 Other specified bacterial agents as the cause of diseases classified elsewhere: Secondary | ICD-10-CM

## 2018-12-17 DIAGNOSIS — I2699 Other pulmonary embolism without acute cor pulmonale: Secondary | ICD-10-CM

## 2018-12-17 MED ORDER — AMOXICILLIN-POT CLAVULANATE 875-125 MG PO TABS: 1.0000 | ORAL_TABLET | Freq: Two times a day (BID) | ORAL | 0 refills | Status: DC

## 2018-12-17 NOTE — Progress Notes (Signed)
Phone [phone]   I acted as a Education administrator for Dr. Jenkins Rouge, LPN Subjective:  Virtual visit via Video note. Chief complaint: Chief Complaint  Patient presents with  . Sinus Problem    This visit type was conducted due to national recommendations for restrictions regarding the COVID-19 Pandemic (e.g. social distancing).  This format is felt to be most appropriate for this patient at this time balancing risks to patient and risks to population by having him in for in person visit.  No physical exam was performed (except for noted visual exam or audio findings with Telehealth visits).    Our team/I connected with [name] at 11:20 AM EDT by a video enabled telemedicine application (doxy.me or caregility through epic) and verified that I am speaking with the correct person using two identifiers.  Location patient: Home-[place] Location provider: [facility], office Persons participating in the virtual visit:  patient  Our team/I discussed the limitations of evaluation and management by telemedicine and the availability of in person appointments. In light of current covid-19 pandemic, patient also understands that we are trying to protect them by minimizing in office contact if at all possible.  The patient expressed consent for telemedicine visit and agreed to proceed. Patient understands insurance will be billed.   ROS-  No fever/chills/nausea/vomiting. Does have left maxillary sinus pressure.   Past Medical History-  Patient Active Problem List   Diagnosis Date Noted  . Sepsis ([place]) 06/21/2018  . Leukocytosis 06/21/2018  . Hypertensive urgency 06/21/2018  . SIRS (systemic inflammatory response syndrome) ([place]) 06/17/2018  . Fever   . Precordial pain   . Elevated lactic acid level   . Bandemia   . Migraine 06/07/2018  . Pulmonary embolism ([place]) 06/06/2018  . DDD (degenerative disc disease), lumbar 05/04/2018  . Atypical chest pain   . Spinal stenosis of lumbar  region 04/01/2015  . Chronic migraine without aura without status migrainosus, not intractable 03/04/2015  . Elevated plasma metanephrines 11/16/2014  . Alopecia 05/21/2014  . Tachycardia 04/04/2014  . Sciatica 01/11/2014  . Back pain 01/11/2014  . Intervertebral disc protrusion 01/11/2014  . HTN (hypertension) 01/04/2013  . Hemorrhoids 04/25/2012  . Urinary retention 12/11/2010  . CERUMEN IMPACTION 01/28/2010  . Depression 04/08/2009  . Myalgia 10/11/2008  . Morbid obesity ([place]) 08/10/2008  . ACNE VULGARIS 08/10/2008  . INSOMNIA 06/20/2008  . VITAMIN B12 DEFICIENCY 03/23/2008  . Hereditary and idiopathic peripheral neuropathy 02/27/2008  . ANXIETY DISORDER, GENERALIZED 01/04/2007  . GERD (gastroesophageal reflux disease) 01/04/2007  . DEGENERATIVE JOINT DISEASE, KNEES, BILATERAL 01/04/2007  . RENAL CALCULUS, HX OF 01/04/2007    Medications- reviewed and updated Current Outpatient Medications  Medication Sig Dispense Refill  . ALPRAZolam (XANAX) 0.5 MG tablet TAKE 1 TABLET BY MOUTH THREE TIMES A DAY AS NEEDED 90 tablet 5  . calcium carbonate (TUMS - DOSED IN MG ELEMENTAL CALCIUM) 500 MG chewable tablet Chew 1 tablet by mouth 2 (two) times daily as needed for indigestion or heartburn.    . cetirizine (ZYRTEC) 10 MG tablet Take 10 mg by mouth daily.    . cyanocobalamin (,VITAMIN B-12,) 1000 MCG/ML injection INJECT 1 ML (1,000 MCG TOTAL) INTO THE MUSCLE EVERY 7 DAYS. 4 mL 8  . cyclobenzaprine (FLEXERIL) 10 MG tablet Take 10 mg by mouth 3 (three) times daily as needed for muscle spasms.    Marland Kitchen docusate sodium (COLACE) 100 MG capsule Take 100 mg by mouth at bedtime as needed.     Marland Kitchen  famotidine (PEPCID) 40 MG tablet Take 1 tablet (40 mg total) by mouth at bedtime. 90 tablet 1  . fluticasone (FLONASE) 50 MCG/ACT nasal spray Place 2 sprays into both nostrils every evening.     . gabapentin (NEURONTIN) 100 MG capsule Take 1 capsule (100 mg total) by mouth 3 (three) times daily. 90 capsule 5   . hydrochlorothiazide (HYDRODIURIL) 25 MG tablet TAKE 1 TABLET BY MOUTH EVERY DAY 30 tablet 5  . [START ON 02/12/2019] HYDROmorphone (DILAUDID) 8 MG tablet Take 1 tablet (8 mg total) by mouth every 4 (four) hours as needed for severe pain. 30 tablet 0  . Multiple Vitamins-Minerals (MULTI-VITAMIN GUMMIES PO) Take 2 tablets by mouth daily.     . nitrofurantoin, macrocrystal-monohydrate, (MACROBID) 100 MG capsule Take 100 mg by mouth daily as needed (for uti prophylaxis, after intercourse).   2  . [START ON 02/12/2019] oxyCODONE (ROXICODONE) 15 MG immediate release tablet Take 1 tablet (15 mg total) by mouth every 4 (four) hours as needed for pain. 180 tablet 0  . pantoprazole (PROTONIX) 40 MG tablet Take 1 tablet (40 mg total) by mouth every morning. Take 30 minutes before breakfast. 90 tablet 3  . potassium chloride (K-DUR) 10 MEQ tablet Take 10 mEq by mouth once a week.    . promethazine (PHENERGAN) 25 MG tablet TAKE 1 TABLET BY MOUTH EVERY 6 HOURS AS NEEDED FOR NAUSEA 90 tablet 1  . simethicone (MYLICON) 80 MG chewable tablet Chew 160 mg by mouth every 6 (six) hours as needed (gas pain).    . SUMAtriptan (IMITREX) 100 MG tablet Take 1 tablet earliest onset of migraine.  May repeat once in 2 hours if headache persists or recurs. 10 tablet 2  . SYRINGE-NEEDLE, DISP, 3 ML (B-D 3CC LUER-LOK SYR 25GX1") 25G X 1" 3 ML MISC USE AS DIRECTED 9 each 11  . Vitamin D, Ergocalciferol, (DRISDOL) 1.25 MG (50000 UT) CAPS capsule TAKE 1 CAPSULE (50,000 UNITS TOTAL) BY MOUTH EVERY 7 (SEVEN) DAYS. 12 capsule 3  . XARELTO 20 MG TABS tablet TAKE 1 TABLET (20 MG TOTAL) BY MOUTH DAILY WITH SUPPER. **P/A DENIED** 30 tablet 3  . gabapentin (NEURONTIN) 300 MG capsule TAKE 1 CAPSULE BY MOUTH THREE TIMES A DAY (Patient not taking: Reported on 12/17/2018) 90 capsule 5  . pramipexole (MIRAPEX) 1 MG tablet Take 1 or 2 tabs at bedtime (Patient not taking: Reported on 12/17/2018) 60 tablet 2     Objective:  LMP 12/13/1999 Comment:  single oophorectomy//a.c. self reported vitals Gen: NAD, resting comfortably Tenderness when taps on left maxillary sinus Lungs: nonlabored, normal respiratory rate  Skin: appears dry, no obvious rash     Assessment and Plan   # Sinus problem S: Pt c/o sinus pressure left side of face, ear hurts and teeth all on left side. Headaches. Nasal congestion, drainage yellow/clear. Denies fever or chills. She is using Zyrtec, Flonase and tylenol with some relief. Took some tylenol yesterday. Sinus pressure primarily left maxillary sinus. Teeth just hurt sitting there. Symptoms since Sunday and worsened starting Friday.   History of sinus surgery 10 years ago. Regularly gets sinus infections but this is the first antibiotic.   Got tested on Thursday for covid 19 -awaiting results.   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  A/P: bacterial sinusitis with 7 days of symptoms and double sickening starting yesterday. Will treat with 10 days of augmentin due to history of needing more prolonged courses- plus this will take her into  week after next when she can get follow up with Dr. Clent RidgesFry if not fully resolved. She agrees if symptoms resolve completely by day 7 to stop antibiotics and discard the remaining ones  - already tested for covid 19 and tests pending- did not repeat test- she is a stay at home mom and will try to stay at home until gets negative result (though I think sinus infection is most likely cause)   # pulmonary embolism S:today is her last day of xarelto after prior provoked PE (related to surgery) and she will be transitioning to aspirin 81 mg per hem/onc A/P: patient wanted me to review meds to see if any potential issues with aspirin- only obvious issue I see is pantoprazole/reflux but I think with her being on protonix and famotidine should likely not cause worsening GERD or ulceration.  -congratulated her on getting through 6 months of anticoagulation! She is excited to stop  xarelto   Recommended follow up: prn for acute issues Future Appointments  Date Time Provider Department Center  12/28/2018  3:00 PM Consuelo PandyWhitt, [place], [place] [facility] None  01/11/2019  3:00 PM Whitt, Mercer PodJulie S, LCSW [facility] None   Lab/Order associations:   ICD-10-CM   1. Bacterial sinusitis  J32.9    B96.89   2. Other pulmonary embolism without acute cor pulmonale, unspecified chronicity (HCC)  I26.99     Meds ordered this encounter  Medications  . amoxicillin-clavulanate (AUGMENTIN) 875-125 MG tablet    Sig: Take 1 tablet by mouth 2 (two) times daily.    Dispense:  20 tablet    Refill:  0    Return precautions advised.  Tana ConchStephen Hunter, MD

## 2018-12-17 NOTE — Patient Instructions (Signed)
Health Maintenance Due  Topic Date Due  . TETANUS/TDAP  04/06/2017  . PAP SMEAR-Modifier  04/09/2017    No flowsheet data found.

## 2018-12-20 LAB — NOVEL CORONAVIRUS, NAA: SARS-CoV-2, NAA: NOT DETECTED

## 2018-12-22 NOTE — Telephone Encounter (Signed)
Last filled 11/03/17 Last OV 12/17/2018 Ok to fill?

## 2018-12-26 ENCOUNTER — Encounter: Payer: Self-pay | Admitting: Family Medicine

## 2018-12-27 ENCOUNTER — Other Ambulatory Visit: Payer: Self-pay | Admitting: Adult Health

## 2018-12-27 MED ORDER — ALPRAZOLAM 0.5 MG PO TABS: 0.5000 mg | ORAL_TABLET | Freq: Three times a day (TID) | ORAL | 0 refills | Status: DC | PRN

## 2018-12-28 ENCOUNTER — Ambulatory Visit (INDEPENDENT_AMBULATORY_CARE_PROVIDER_SITE_OTHER): Payer: BC Managed Care – PPO | Admitting: Licensed Clinical Social Worker

## 2018-12-28 DIAGNOSIS — F3341 Major depressive disorder, recurrent, in partial remission: Secondary | ICD-10-CM | POA: Diagnosis not present

## 2019-01-09 NOTE — Telephone Encounter (Signed)
Pt went and saw Dr. Yong Channel at [facility]

## 2019-01-11 ENCOUNTER — Ambulatory Visit (INDEPENDENT_AMBULATORY_CARE_PROVIDER_SITE_OTHER): Payer: BC Managed Care – PPO | Admitting: Licensed Clinical Social Worker

## 2019-01-11 DIAGNOSIS — F3341 Major depressive disorder, recurrent, in partial remission: Secondary | ICD-10-CM

## 2019-01-20 ENCOUNTER — Telehealth: Payer: Self-pay | Admitting: Family Medicine

## 2019-01-20 NOTE — Telephone Encounter (Signed)
Patient dropped a Procedure Assessment form   Fax form: [phone] ATTN: Isaiah Blakes DDS   Disposition: Dr's Dierdre Highman

## 2019-01-23 NOTE — Telephone Encounter (Signed)
Form has been placed in Dr. Barbie Banner red folder for completion.

## 2019-01-24 DIAGNOSIS — Z0279 Encounter for issue of other medical certificate: Secondary | ICD-10-CM

## 2019-01-24 NOTE — Telephone Encounter (Signed)
Form has been faxed. Nothing further needed.

## 2019-01-24 NOTE — Telephone Encounter (Signed)
The form is ready  

## 2019-01-25 ENCOUNTER — Ambulatory Visit (INDEPENDENT_AMBULATORY_CARE_PROVIDER_SITE_OTHER): Payer: BC Managed Care – PPO | Admitting: Licensed Clinical Social Worker

## 2019-01-25 DIAGNOSIS — F331 Major depressive disorder, recurrent, moderate: Secondary | ICD-10-CM

## 2019-01-29 ENCOUNTER — Other Ambulatory Visit: Payer: Self-pay | Admitting: Family Medicine

## 2019-02-08 ENCOUNTER — Ambulatory Visit: Payer: BC Managed Care – PPO | Admitting: Licensed Clinical Social Worker

## 2019-02-08 DIAGNOSIS — M1711 Unilateral primary osteoarthritis, right knee: Secondary | ICD-10-CM | POA: Diagnosis not present

## 2019-02-11 DIAGNOSIS — M25561 Pain in right knee: Secondary | ICD-10-CM | POA: Diagnosis not present

## 2019-02-14 ENCOUNTER — Encounter: Payer: Self-pay | Admitting: Family Medicine

## 2019-02-14 DIAGNOSIS — R293 Abnormal posture: Secondary | ICD-10-CM | POA: Diagnosis not present

## 2019-02-14 DIAGNOSIS — M5136 Other intervertebral disc degeneration, lumbar region: Secondary | ICD-10-CM | POA: Diagnosis not present

## 2019-02-14 DIAGNOSIS — M545 Low back pain: Secondary | ICD-10-CM | POA: Diagnosis not present

## 2019-02-14 DIAGNOSIS — M1711 Unilateral primary osteoarthritis, right knee: Secondary | ICD-10-CM | POA: Diagnosis not present

## 2019-02-14 DIAGNOSIS — R262 Difficulty in walking, not elsewhere classified: Secondary | ICD-10-CM | POA: Diagnosis not present

## 2019-02-15 NOTE — Telephone Encounter (Signed)
Pt called about Rx and I checked with Pharmacy and they found Rx but they dont have enough in stock to refill so can this Rx for oxyCODONE (ROXICODONE) 15 MG immediate release tablet  [facility] [address] They have enough in stock for the Pt / please advise   [facility] [id number] [place] [address], [place] [address] [phone] (Phone) [phone] (Fax)

## 2019-02-16 NOTE — Telephone Encounter (Signed)
Patient called to see if [facility] due to only having 2 pills left. Please advise.

## 2019-02-16 NOTE — Telephone Encounter (Signed)
Spoke to pharmacist and she advise Rx was in system. Tried to call pt for update bu no answer

## 2019-02-16 NOTE — Telephone Encounter (Signed)
This has been taking care of.

## 2019-02-17 NOTE — Telephone Encounter (Signed)
Spoke to pt and asked if she received her Rx. Pt stated she hasnt gotten a call from the pharmacy but she hasn't called either. Pt stated she will call the pharmacy and call us back if Rx is not there.

## 2019-02-17 NOTE — Telephone Encounter (Signed)
Pt needs the [facility]ill [facility] in [address] due to the [facility] not having enough in stock / please advise

## 2019-02-17 NOTE — Telephone Encounter (Signed)
Pt is calling back to see if she needs to make an appointment for this medication to be transferred to a different pharmacy since the other one does not have enough in stock.

## 2019-02-17 NOTE — Telephone Encounter (Unsigned)
Copied from [place] [phone]. Topic: General - Other >> Feb 17, 2019  4:33 PM Mcneil, Ja-Kwan wrote: Reason for CRM: Pt stated the Rx still has not been received at the [facility] in [employer]. Pt stated she needs the Rx to be sent today. Pt requests call back from [facility]

## 2019-02-17 NOTE — Telephone Encounter (Signed)
Patient would like a follow up call regarding message below, please advise [phone]

## 2019-02-17 NOTE — Telephone Encounter (Signed)
°  Relation to pt: self  Call back number: [phone]  Pharmacy: [facility], [place], [place] [address]   Reason for call:  Patient requesting oxyCODONE (ROXICODONE) 15 MG immediate release tablet please send to to new pharmacy. Patient states the initial pharmacy didn't have enough pills in stock. Patient requesting rx please send today and would like a follow up call today.

## 2019-02-17 NOTE — Telephone Encounter (Signed)
Could you please [facility] [address] RX

## 2019-02-20 MED ORDER — OXYCODONE HCL 15 MG PO TABS: 15.0000 mg | ORAL_TABLET | ORAL | 0 refills | Status: DC | PRN

## 2019-02-20 NOTE — Telephone Encounter (Signed)
Done for one month  ?

## 2019-02-22 ENCOUNTER — Ambulatory Visit: Payer: BC Managed Care – PPO | Admitting: Licensed Clinical Social Worker

## 2019-03-01 ENCOUNTER — Ambulatory Visit (INDEPENDENT_AMBULATORY_CARE_PROVIDER_SITE_OTHER): Payer: BC Managed Care – PPO | Admitting: Licensed Clinical Social Worker

## 2019-03-01 DIAGNOSIS — F331 Major depressive disorder, recurrent, moderate: Secondary | ICD-10-CM | POA: Diagnosis not present

## 2019-03-07 DIAGNOSIS — R262 Difficulty in walking, not elsewhere classified: Secondary | ICD-10-CM | POA: Diagnosis not present

## 2019-03-07 DIAGNOSIS — R293 Abnormal posture: Secondary | ICD-10-CM | POA: Diagnosis not present

## 2019-03-07 DIAGNOSIS — M5136 Other intervertebral disc degeneration, lumbar region: Secondary | ICD-10-CM | POA: Diagnosis not present

## 2019-03-07 DIAGNOSIS — M545 Low back pain: Secondary | ICD-10-CM | POA: Diagnosis not present

## 2019-03-08 ENCOUNTER — Ambulatory Visit (INDEPENDENT_AMBULATORY_CARE_PROVIDER_SITE_OTHER): Payer: BC Managed Care – PPO | Admitting: Licensed Clinical Social Worker

## 2019-03-08 DIAGNOSIS — F331 Major depressive disorder, recurrent, moderate: Secondary | ICD-10-CM | POA: Diagnosis not present

## 2019-03-12 ENCOUNTER — Other Ambulatory Visit: Payer: Self-pay | Admitting: Family Medicine

## 2019-03-17 ENCOUNTER — Encounter: Payer: Self-pay | Admitting: Family Medicine

## 2019-03-20 ENCOUNTER — Telehealth: Payer: Self-pay | Admitting: Family Medicine

## 2019-03-20 NOTE — Telephone Encounter (Signed)
Pt is due for PMV visit for tomorrow. However Dr.Fry is not in for the rest of the week. Pt stated she will run out if she waits until next week. Pt wants to know what can be done.   Dr.Hernandez I scheduled pt a virtual visit with you for 10/20 @ 2:30 pm. Please advise if this is appropriate so that I can fix this A.S.A.P  Thanks

## 2019-03-20 NOTE — Telephone Encounter (Signed)
Pt was calling in to get scheduled with Dr. Sarajane Jews she is aware what he is not going to be in the office this whole week.  Pt would like to see if another provider could refill her pain management medication until Dr. Sarajane Jews comes back.  Pt is aware that I will check to see if another provider will be willing to do so.  Pt would like to have a call back to let her know.

## 2019-03-21 ENCOUNTER — Telehealth (INDEPENDENT_AMBULATORY_CARE_PROVIDER_SITE_OTHER): Payer: BC Managed Care – PPO | Admitting: Internal Medicine

## 2019-03-21 ENCOUNTER — Other Ambulatory Visit: Payer: Self-pay

## 2019-03-21 DIAGNOSIS — Z79899 Other long term (current) drug therapy: Secondary | ICD-10-CM

## 2019-03-21 DIAGNOSIS — M5136 Other intervertebral disc degeneration, lumbar region: Secondary | ICD-10-CM | POA: Diagnosis not present

## 2019-03-21 MED ORDER — OXYCODONE HCL 15 MG PO TABS: 15.0000 mg | ORAL_TABLET | ORAL | 0 refills | Status: DC | PRN

## 2019-03-21 NOTE — Progress Notes (Signed)
Virtual Visit via Video Note  I connected with [name] on 03/21/19 at  2:30 PM EDT by a video enabled telemedicine application and verified that I am speaking with the correct person using two identifiers.  Location patient: home Location provider: work office Persons participating in the virtual visit: patient, provider  I discussed the limitations of evaluation and management by telemedicine and the availability of in person appointments. The patient expressed understanding and agreed to proceed.   HPI: This is a scheduled visit for pain management. She is on a chronic narcotic contract with Dr. Sarajane Jews. He is out of the office and patient is due for med refills. She is on Oxy IR 15 mg q 4 hrs PRN and dilaudid 8 mg q24 hours PRN. She does not use dilaudid frequently and only needs oxycodone refills today. She has DDD and almost constant back pain. She continues to attend PT.   ROS: Constitutional: Denies fever, chills, diaphoresis, appetite change and fatigue.  HEENT: Denies photophobia, eye pain, redness, hearing loss, ear pain, congestion, sore throat, rhinorrhea, sneezing, mouth sores, trouble swallowing, neck pain, neck stiffness and tinnitus.   Respiratory: Denies SOB, DOE, cough, chest tightness,  and wheezing.   Cardiovascular: Denies chest pain, palpitations and leg swelling.  Gastrointestinal: Denies nausea, vomiting, abdominal pain, diarrhea, constipation, blood in stool and abdominal distention.  Genitourinary: Denies dysuria, urgency, frequency, hematuria, flank pain and difficulty urinating.  Endocrine: Denies: hot or cold intolerance, sweats, changes in hair or nails, polyuria, polydipsia. Musculoskeletal: Denies myalgias, back pain, joint swelling, arthralgias and gait problem.  Skin: Denies pallor, rash and wound.  Neurological: Denies dizziness, seizures, syncope, weakness, light-headedness, numbness and headaches.  Hematological: Denies adenopathy. Easy  bruising, personal or family bleeding history  Psychiatric/Behavioral: Denies suicidal ideation, mood changes, confusion, nervousness, sleep disturbance and agitation   Past Medical History:  Diagnosis Date  . Allergy   . Anxiety   . Arthritis   . B12 deficiency   . Chronic narcotic use   . Depression    not currently   . Dysrhythmia    hx tachy-was from medication nortriptylline  . Esophagitis    rx  . Fibromyalgia   . GERD (gastroesophageal reflux disease)   . History of echocardiogram    Echo 4/18: Mild concentric LVH, vigorous LVF, EF 65-70, normal wall motion, grade 1 diastolic dysfunction  . History of kidney stones   . Hyperlipidemia   . Intervertebral disc protrusion 01/11/2014  . Migraine   . Obesity (BMI 35.0-39.9 without comorbidity)   . Peripheral neuropathy   . Polycystic ovaries   . Pulmonary embolus ([place])   . Spinal stenosis, lumbar   . Vitamin D deficiency     Past Surgical History:  Procedure Laterality Date  . [place] STUDY N/A 05/27/2016   Procedure: [place] STUDY;  Surgeon: Manus Gunning, MD;  [facility]: [facility];  Service: Gastroenterology;  Laterality: N/A;  . ABDOMINAL EXPOSURE N/A 05/04/2018   Procedure: ABDOMINAL EXPOSURE;  Surgeon: Rosetta Posner, MD;  Location: [facility];  Service: Vascular;  Laterality: N/A;  . ANTERIOR LUMBAR FUSION N/A 05/04/2018   Procedure: Lumbar Five Sacral One Anterior lumbar interbody fusion;  Surgeon: Kary Kos, MD;  Location: [facility];  Service: Neurosurgery;  Laterality: N/A;  Lumbar Five Sacral One Anterior lumbar interbody fusion  . APPENDECTOMY    . BACK SURGERY  2012   left laminectomy at L3-4 per Dr. Luiz Ochoa   . bladder tack  2012  . CHOLECYSTECTOMY N/A 10/03/2015   Procedure: LAPAROSCOPIC CHOLECYSTECTOMY;  Surgeon: Rodman PickleLuke Aaron Kinsinger, MD;  [facility];  Service: General;  Laterality: N/A;  . ESOPHAGEAL MANOMETRY N/A 05/27/2016   Procedure: ESOPHAGEAL MANOMETRY (EM);  Surgeon: Ruffin FrederickSteven Paul  Armbruster, MD;  [facility];  Service: Gastroenterology;  Laterality: N/A;  . EXCISION OF SKIN TAG  10/03/2015   Procedure: EXCISION OF SKIN TAG;  Surgeon: De BlanchLuke Aaron Kinsinger, MD;  Location: [facility];  Service: General;;  . LUMBAR DISC SURGERY  01-31-14   per Dr. Wynetta Emeryram   . OOPHORECTOMY     left overy  . OVARIAN CYST REMOVAL    . RECTOCELE REPAIR    . SINUSOTOMY  12/14/06  . spinal fusion  2016/10  . TONSILLECTOMY    . TONSILLECTOMY    . VAGINAL HYSTERECTOMY    . WRIST GANGLION EXCISION Left     Family History  Problem Relation Age of Onset  . Fibromyalgia Mother   . Diabetes Mother   . Thyroid cancer Mother   . Liver disease Mother   . Neuropathy Mother   . Cirrhosis Mother        non-alcoholic  . Pulmonary embolism Mother        both lungs  . Heart failure Mother   . Hypertension Father   . Diabetes Father   . Heart disease Father   . Prostate cancer Father   . Heart attack Father        x 2  . Colon cancer Neg Hx   . Other Neg Hx        pheochromocytoma  . Colon polyps Neg Hx     SOCIAL HX:   reports that she quit smoking about 21 years ago. Her smoking use included cigarettes. She has never used smokeless tobacco. She reports that she does not drink alcohol or use drugs.   Current Outpatient Medications:  .  ALPRAZolam (XANAX) 0.5 MG tablet, TAKE 1 TABLET BY MOUTH 3 TIMES A DAY AS NEEDED, Disp: 90 tablet, Rfl: 5 .  ALPRAZolam (XANAX) 0.5 MG tablet, Take 1 tablet (0.5 mg total) by mouth 3 (three) times daily as needed., Disp: 20 tablet, Rfl: 0 .  amoxicillin-clavulanate (AUGMENTIN) 875-125 MG tablet, Take 1 tablet by mouth 2 (two) times daily., Disp: 20 tablet, Rfl: 0 .  calcium carbonate (TUMS - DOSED IN MG ELEMENTAL CALCIUM) 500 MG chewable tablet, Chew 1 tablet by mouth 2 (two) times daily as needed for indigestion or heartburn., Disp: , Rfl:  .  cetirizine (ZYRTEC) 10 MG tablet, Take 10 mg by mouth daily., Disp: , Rfl:  .  cyanocobalamin (,VITAMIN B-12,)  1000 MCG/ML injection, INJECT 1 ML (1,000 MCG TOTAL) INTO THE MUSCLE EVERY 7 DAYS., Disp: 4 mL, Rfl: 8 .  cyclobenzaprine (FLEXERIL) 10 MG tablet, Take 10 mg by mouth 3 (three) times daily as needed for muscle spasms., Disp: , Rfl:  .  docusate sodium (COLACE) 100 MG capsule, Take 100 mg by mouth at bedtime as needed. , Disp: , Rfl:  .  famotidine (PEPCID) 40 MG tablet, TAKE 1 TABLET BY MOUTH EVERYDAY AT BEDTIME, Disp: 30 tablet, Rfl: 5 .  fluticasone (FLONASE) 50 MCG/ACT nasal spray, Place 2 sprays into both nostrils every evening. , Disp: , Rfl:  .  gabapentin (NEURONTIN) 100 MG capsule, Take 1 capsule (100 mg total) by mouth 3 (three) times daily., Disp: 90 capsule, Rfl: 5 .  gabapentin (NEURONTIN) 300 MG capsule, TAKE 1 CAPSULE BY  MOUTH THREE TIMES A DAY (Patient not taking: Reported on 12/17/2018), Disp: 90 capsule, Rfl: 5 .  hydrochlorothiazide (HYDRODIURIL) 25 MG tablet, TAKE 1 TABLET BY MOUTH EVERY DAY, Disp: 30 tablet, Rfl: 5 .  HYDROmorphone (DILAUDID) 8 MG tablet, Take 1 tablet (8 mg total) by mouth every 4 (four) hours as needed for severe pain., Disp: 30 tablet, Rfl: 0 .  Multiple Vitamins-Minerals (MULTI-VITAMIN GUMMIES PO), Take 2 tablets by mouth daily. , Disp: , Rfl:  .  nitrofurantoin, macrocrystal-monohydrate, (MACROBID) 100 MG capsule, Take 100 mg by mouth daily as needed (for uti prophylaxis, after intercourse). , Disp: , Rfl: 2 .  oxyCODONE (ROXICODONE) 15 MG immediate release tablet, Take 1 tablet (15 mg total) by mouth every 4 (four) hours as needed for pain., Disp: 180 tablet, Rfl: 0 .  pantoprazole (PROTONIX) 40 MG tablet, Take 1 tablet (40 mg total) by mouth every morning. Take 30 minutes before breakfast., Disp: 90 tablet, Rfl: 3 .  potassium chloride (K-DUR) 10 MEQ tablet, Take 10 mEq by mouth once a week., Disp: , Rfl:  .  pramipexole (MIRAPEX) 1 MG tablet, Take 1 or 2 tabs at bedtime (Patient not taking: Reported on 12/17/2018), Disp: 60 tablet, Rfl: 2 .  promethazine  (PHENERGAN) 25 MG tablet, TAKE 1 TABLET BY MOUTH EVERY 6 HOURS AS NEEDED FOR NAUSEA, Disp: 90 tablet, Rfl: 1 .  simethicone (MYLICON) 80 MG chewable tablet, Chew 160 mg by mouth every 6 (six) hours as needed (gas pain)., Disp: , Rfl:  .  SUMAtriptan (IMITREX) 100 MG tablet, Take 1 tablet earliest onset of migraine.  May repeat once in 2 hours if headache persists or recurs., Disp: 10 tablet, Rfl: 2 .  SYRINGE-NEEDLE, DISP, 3 ML (B-D 3CC LUER-LOK SYR 25GX1") 25G X 1" 3 ML MISC, USE AS DIRECTED, Disp: 9 each, Rfl: 11 .  Vitamin D, Ergocalciferol, (DRISDOL) 1.25 MG (50000 UT) CAPS capsule, TAKE 1 CAPSULE (50,000 UNITS TOTAL) BY MOUTH EVERY 7 (SEVEN) DAYS., Disp: 12 capsule, Rfl: 3 .  XARELTO 20 MG TABS tablet, TAKE 1 TABLET (20 MG TOTAL) BY MOUTH DAILY WITH SUPPER. **P/A DENIED**, Disp: 30 tablet, Rfl: 3  EXAM:   VITALS per patient if applicable: none reported  GENERAL: alert, oriented, appears well and in no acute distress  HEENT: atraumatic, conjunttiva clear, no obvious abnormalities on inspection of external nose and ears  NECK: normal movements of the head and neck  LUNGS: on inspection no signs of respiratory distress, breathing rate appears normal, no obvious gross increased work of breathing, gasping or wheezing  CV: no obvious cyanosis  MS: moves all visible extremities without noticeable abnormality  PSYCH/NEURO: pleasant and cooperative, no obvious depression or anxiety, speech and thought processing grossly intact  ASSESSMENT AND PLAN:   High risk medication use  DDD (degenerative disc disease), lumbar  NCCSRS reviewed in EPIC   Indication for chronic opioid: Lumbar DDD Medication and dose: Oxycodone 15 mg q 4 hrs PRN # pills per month: #180 Last UDS date: 03/2018 Opioid Treatment Agreement signed: yes Opioid Treatment Agreement last reviewed with patient: 12/07/18 NCCSRS reviewed this encounter (include red flags):  yes, no red flags, overdose risk score is 570.    F/u with PCP next month for refills.   I discussed the assessment and treatment plan with the patient. The patient was provided an opportunity to ask questions and all were answered. The patient agreed with the plan and demonstrated an understanding of the instructions.   The patient was  advised to call back or seek an in-person evaluation if the symptoms worsen or if the condition fails to improve as anticipated.    Lelon Frohlich, MD  [facility]

## 2019-03-22 ENCOUNTER — Ambulatory Visit (INDEPENDENT_AMBULATORY_CARE_PROVIDER_SITE_OTHER): Payer: BC Managed Care – PPO | Admitting: Licensed Clinical Social Worker

## 2019-03-22 DIAGNOSIS — M1711 Unilateral primary osteoarthritis, right knee: Secondary | ICD-10-CM | POA: Diagnosis not present

## 2019-03-22 DIAGNOSIS — F331 Major depressive disorder, recurrent, moderate: Secondary | ICD-10-CM

## 2019-03-28 DIAGNOSIS — R262 Difficulty in walking, not elsewhere classified: Secondary | ICD-10-CM | POA: Diagnosis not present

## 2019-03-28 DIAGNOSIS — M5136 Other intervertebral disc degeneration, lumbar region: Secondary | ICD-10-CM | POA: Diagnosis not present

## 2019-03-28 DIAGNOSIS — R293 Abnormal posture: Secondary | ICD-10-CM | POA: Diagnosis not present

## 2019-03-28 DIAGNOSIS — M545 Low back pain: Secondary | ICD-10-CM | POA: Diagnosis not present

## 2019-04-04 ENCOUNTER — Encounter: Payer: Self-pay | Admitting: Hematology

## 2019-04-05 ENCOUNTER — Telehealth: Payer: Self-pay | Admitting: Family Medicine

## 2019-04-05 ENCOUNTER — Telehealth: Payer: Self-pay

## 2019-04-05 ENCOUNTER — Ambulatory Visit (INDEPENDENT_AMBULATORY_CARE_PROVIDER_SITE_OTHER): Payer: BC Managed Care – PPO | Admitting: Licensed Clinical Social Worker

## 2019-04-05 DIAGNOSIS — F331 Major depressive disorder, recurrent, moderate: Secondary | ICD-10-CM

## 2019-04-05 NOTE — Telephone Encounter (Signed)
Will pt need any labs if not will appt. Be okay as VV.

## 2019-04-05 NOTE — Telephone Encounter (Signed)
Pt called back in and wanted to change her appointment on 11/20 to a virtual appointment and asked if she needed labs for her medication management.  Pt would like to have a call back to let her know if she needs to have labs and if so she is okay to come in to have it done.

## 2019-04-05 NOTE — Telephone Encounter (Signed)
Copied from [place] [id number]. Topic: General - Other >> Apr 05, 2019  4:02 PM Celene Kras A wrote: Reason for CRM: Pt called and is requesting to know if its okay if she flies due to her blood clots. Pt states she feels like she Dr. Irene Limbo said it was okay to resume normal activities. Please advise. >> Apr 05, 2019  4:08 PM Celene Kras A wrote: Pt is also requesting to be placed on the waiting list for her physical. Please advise.

## 2019-04-06 ENCOUNTER — Encounter: Payer: Self-pay | Admitting: Family Medicine

## 2019-04-06 NOTE — Telephone Encounter (Signed)
Please advise on the blood clot. Will call to make appt. After advise given from Dr.Fry

## 2019-04-06 NOTE — Telephone Encounter (Signed)
Spoke to Dr.Fry and he advised verbally that pt should stop the Aspirin 81 and start on Aspirin 325 mg. Pt is to take Aspirin 325 mg for the length of the trip. Pt is also advised when okay per captain, to walk up and down the aisle at least 2 times. Pt is to stop the 325 mg of Aspirin and start back on Aspirin 81 mg when she returns from trip.

## 2019-04-06 NOTE — Telephone Encounter (Signed)
As above.

## 2019-04-06 NOTE — Telephone Encounter (Signed)
I think this is for a PMV, and if so the only lab she would need to do is a urine drug screen. Please schedule this at the lab, and we can meet virtually

## 2019-04-06 NOTE — Telephone Encounter (Signed)
Pt has been scheduled for labs 

## 2019-04-07 NOTE — Telephone Encounter (Signed)
Pt notified of update.  

## 2019-04-10 ENCOUNTER — Other Ambulatory Visit: Payer: BC Managed Care – PPO

## 2019-04-10 ENCOUNTER — Other Ambulatory Visit: Payer: Self-pay

## 2019-04-10 DIAGNOSIS — Z6841 Body Mass Index (BMI) 40.0 and over, adult: Secondary | ICD-10-CM | POA: Diagnosis not present

## 2019-04-10 DIAGNOSIS — Z79899 Other long term (current) drug therapy: Secondary | ICD-10-CM

## 2019-04-10 DIAGNOSIS — N819 Female genital prolapse, unspecified: Secondary | ICD-10-CM | POA: Diagnosis not present

## 2019-04-10 DIAGNOSIS — Z01419 Encounter for gynecological examination (general) (routine) without abnormal findings: Secondary | ICD-10-CM | POA: Diagnosis not present

## 2019-04-10 DIAGNOSIS — M797 Fibromyalgia: Secondary | ICD-10-CM | POA: Insufficient documentation

## 2019-04-12 DIAGNOSIS — M1711 Unilateral primary osteoarthritis, right knee: Secondary | ICD-10-CM | POA: Diagnosis not present

## 2019-04-12 LAB — PAIN MGMT, PROFILE 8 W/CONF, U
6 Acetylmorphine: NEGATIVE ng/mL
Alcohol Metabolites: NEGATIVE ng/mL (ref <500)
Alphahydroxyalprazolam: 188 ng/mL
Alphahydroxymidazolam: NEGATIVE ng/mL
Alphahydroxytriazolam: NEGATIVE ng/mL
Aminoclonazepam: NEGATIVE ng/mL
Amphetamines: NEGATIVE ng/mL
Benzodiazepines: POSITIVE ng/mL
Buprenorphine, Urine: NEGATIVE ng/mL
Cocaine Metabolite: NEGATIVE ng/mL
Codeine: NEGATIVE ng/mL
Creatinine: 283.2 mg/dL
Ethyl Glucuronide (ETG): NEGATIVE ng/mL
Ethyl Sulfate (ETS): NEGATIVE ng/mL
Hydrocodone: NEGATIVE ng/mL
Hydromorphone: 232 ng/mL
Hydroxyethylflurazepam: NEGATIVE ng/mL
Lorazepam: NEGATIVE ng/mL
MDMA: NEGATIVE ng/mL
Marijuana Metabolite: NEGATIVE ng/mL
Morphine: NEGATIVE ng/mL
Nordiazepam: NEGATIVE ng/mL
Norhydrocodone: NEGATIVE ng/mL
Noroxycodone: >10000 ng/mL
Opiates: POSITIVE ng/mL
Oxazepam: NEGATIVE ng/mL
Oxidant: NEGATIVE ug/mL
Oxycodone: 8029 ng/mL
Oxycodone: POSITIVE ng/mL
Oxymorphone: >10000 ng/mL
Temazepam: NEGATIVE ng/mL
pH: 6.3 (ref 4.5–9.0)

## 2019-04-17 ENCOUNTER — Encounter: Payer: Self-pay | Admitting: Family Medicine

## 2019-04-17 ENCOUNTER — Other Ambulatory Visit: Payer: Self-pay

## 2019-04-17 ENCOUNTER — Ambulatory Visit (INDEPENDENT_AMBULATORY_CARE_PROVIDER_SITE_OTHER): Payer: BC Managed Care – PPO | Admitting: Family Medicine

## 2019-04-17 DIAGNOSIS — F1190 Opioid use, unspecified, uncomplicated: Secondary | ICD-10-CM

## 2019-04-17 DIAGNOSIS — M545 Low back pain, unspecified: Secondary | ICD-10-CM

## 2019-04-17 DIAGNOSIS — M5136 Other intervertebral disc degeneration, lumbar region: Secondary | ICD-10-CM | POA: Diagnosis not present

## 2019-04-17 DIAGNOSIS — Z79899 Other long term (current) drug therapy: Secondary | ICD-10-CM | POA: Diagnosis not present

## 2019-04-17 DIAGNOSIS — G8929 Other chronic pain: Secondary | ICD-10-CM

## 2019-04-17 MED ORDER — HYDROMORPHONE HCL 8 MG PO TABS: 8.0000 mg | ORAL_TABLET | Freq: Four times a day (QID) | ORAL | 0 refills | Status: DC | PRN

## 2019-04-17 MED ORDER — OXYCODONE HCL 15 MG PO TABS: 15.0000 mg | ORAL_TABLET | ORAL | 0 refills | Status: DC | PRN

## 2019-04-17 NOTE — Progress Notes (Signed)
Subjective:    Patient ID: [name], female    DOB: 06/02/1975, 43 y.o.   MRN: [id number]  HPI Virtual Visit via Video Note  I connected with the patient on 04/17/19 at  1:00 PM EST by a video enabled telemedicine application and verified that I am speaking with the correct person using two identifiers.  Location patient: home Location provider:work or home office Persons participating in the virtual visit: patient, provider  I discussed the limitations of evaluation and management by telemedicine and the availability of in person appointments. The patient expressed understanding and agreed to proceed.   HPI: Here for pain management. She is still struggling with her back pain. She is finishing up PT soon, but she wants to [facility]. She also has a bad right knee, and Dr. Farris HasKramer has recommended a complete replacement surgery. She is delaying this as long as possible.  Indication for chronic opioid: low back pain Medication and dose: Oxycodone 15 mg and Dilaudid 8 mg # pills per month: 180 and 30 Last UDS date: 03-09-18 Opioid Treatment Agreement signed (Y/N): 03-09-18 Opioid Treatment Agreement last reviewed with patient:  04-17-19 NCCSRS reviewed this encounter (include red flags):  04-17-19   ROS: See pertinent positives and negatives per HPI.  Past Medical History:  Diagnosis Date  . Allergy   . Anxiety   . Arthritis   . B12 deficiency   . Chronic narcotic use   . Depression    not currently   . Dysrhythmia    hx tachy-was from medication nortriptylline  . Esophagitis    rx  . Fibromyalgia   . GERD (gastroesophageal reflux disease)   . History of echocardiogram    Echo 4/18: Mild concentric LVH, vigorous LVF, EF 65-70, normal wall motion, grade 1 diastolic dysfunction  . History of kidney stones   . Hyperlipidemia   . Intervertebral disc protrusion 01/11/2014  . Migraine   . Obesity (BMI 35.0-39.9 without comorbidity)    . Peripheral neuropathy   . Polycystic ovaries   . Pulmonary embolus ([facility])   . Spinal stenosis, lumbar   . Vitamin D deficiency     Past Surgical History:  Procedure Laterality Date  . 24 HOUR PH STUDY N/A 05/27/2016   Procedure: 24 HOUR PH STUDY;  Surgeon: Ruffin FrederickSteven Paul Armbruster, MD;  [facility];  Service: Gastroenterology;  Laterality: N/A;  . ABDOMINAL EXPOSURE N/A 05/04/2018   Procedure: ABDOMINAL EXPOSURE;  Surgeon: Larina EarthlyEarly, Todd F, MD;  [facility];  Service: Vascular;  Laterality: N/A;  . ANTERIOR LUMBAR FUSION N/A 05/04/2018   Procedure: Lumbar Five Sacral One Anterior lumbar interbody fusion;  Surgeon: Donalee Citrinram, Gary, MD;  [facility];  Service: Neurosurgery;  Laterality: N/A;  Lumbar Five Sacral One Anterior lumbar interbody fusion  . APPENDECTOMY    . BACK SURGERY  2012   left laminectomy at L3-4 per Dr. Phoebe PerchHirsch   . bladder tack  2012  . CHOLECYSTECTOMY N/A 10/03/2015   Procedure: LAPAROSCOPIC CHOLECYSTECTOMY;  Surgeon: Rodman PickleLuke Aaron Kinsinger, MD;  [facility];  Service: General;  Laterality: N/A;  . ESOPHAGEAL MANOMETRY N/A 05/27/2016   Procedure: ESOPHAGEAL MANOMETRY (EM);  Surgeon: Ruffin FrederickSteven Paul Armbruster, MD;  [facility];  Service: Gastroenterology;  Laterality: N/A;  . EXCISION OF SKIN TAG  10/03/2015   Procedure: EXCISION OF SKIN TAG;  Surgeon: De BlanchLuke Aaron Kinsinger, MD;  [facility];  Service: General;;  . LUMBAR DISC SURGERY  01-31-14   per Dr. Saintclair Halsted   . OOPHORECTOMY     left overy  . OVARIAN CYST REMOVAL    . RECTOCELE REPAIR    . SINUSOTOMY  12/14/06  . spinal fusion  2016/10  . TONSILLECTOMY    . TONSILLECTOMY    . VAGINAL HYSTERECTOMY    . WRIST GANGLION EXCISION Left     Family History  Problem Relation Age of Onset  . Fibromyalgia Mother   . Diabetes Mother   . Thyroid cancer Mother   . Liver disease Mother   . Neuropathy Mother   . Cirrhosis Mother        non-alcoholic  . Pulmonary embolism Mother        both lungs  .  Heart failure Mother   . Hypertension Father   . Diabetes Father   . Heart disease Father   . Prostate cancer Father   . Heart attack Father        x 2  . Colon cancer Neg Hx   . Other Neg Hx        pheochromocytoma  . Colon polyps Neg Hx      Current Outpatient Medications:  .  ALPRAZolam (XANAX) 0.5 MG tablet, TAKE 1 TABLET BY MOUTH 3 TIMES A DAY AS NEEDED, Disp: 90 tablet, Rfl: 5 .  ALPRAZolam (XANAX) 0.5 MG tablet, Take 1 tablet (0.5 mg total) by mouth 3 (three) times daily as needed., Disp: 20 tablet, Rfl: 0 .  amoxicillin-clavulanate (AUGMENTIN) 875-125 MG tablet, Take 1 tablet by mouth 2 (two) times daily., Disp: 20 tablet, Rfl: 0 .  calcium carbonate (TUMS - DOSED IN MG ELEMENTAL CALCIUM) 500 MG chewable tablet, Chew 1 tablet by mouth 2 (two) times daily as needed for indigestion or heartburn., Disp: , Rfl:  .  cetirizine (ZYRTEC) 10 MG tablet, Take 10 mg by mouth daily., Disp: , Rfl:  .  cyanocobalamin (,VITAMIN B-12,) 1000 MCG/ML injection, INJECT 1 ML (1,000 MCG TOTAL) INTO THE MUSCLE EVERY 7 DAYS., Disp: 4 mL, Rfl: 8 .  cyclobenzaprine (FLEXERIL) 10 MG tablet, Take 10 mg by mouth 3 (three) times daily as needed for muscle spasms., Disp: , Rfl:  .  docusate sodium (COLACE) 100 MG capsule, Take 100 mg by mouth at bedtime as needed. , Disp: , Rfl:  .  famotidine (PEPCID) 40 MG tablet, TAKE 1 TABLET BY MOUTH EVERYDAY AT BEDTIME, Disp: 30 tablet, Rfl: 5 .  fluticasone (FLONASE) 50 MCG/ACT nasal spray, Place 2 sprays into both nostrils every evening. , Disp: , Rfl:  .  gabapentin (NEURONTIN) 100 MG capsule, Take 1 capsule (100 mg total) by mouth 3 (three) times daily., Disp: 90 capsule, Rfl: 5 .  gabapentin (NEURONTIN) 300 MG capsule, TAKE 1 CAPSULE BY MOUTH THREE TIMES A DAY (Patient not taking: Reported on 12/17/2018), Disp: 90 capsule, Rfl: 5 .  hydrochlorothiazide (HYDRODIURIL) 25 MG tablet, TAKE 1 TABLET BY MOUTH EVERY DAY, Disp: 30 tablet, Rfl: 5 .  [START ON 06/20/2019]  HYDROmorphone (DILAUDID) 8 MG tablet, Take 1 tablet (8 mg total) by mouth every 6 (six) hours as needed for severe pain., Disp: 30 tablet, Rfl: 0 .  Multiple Vitamins-Minerals (MULTI-VITAMIN GUMMIES PO), Take 2 tablets by mouth daily. , Disp: , Rfl:  .  nitrofurantoin, macrocrystal-monohydrate, (MACROBID) 100 MG capsule, Take 100 mg by mouth daily as needed (for uti prophylaxis, after intercourse). , Disp: , Rfl: 2 .  [START ON 06/20/2019] oxyCODONE (ROXICODONE) 15 MG immediate release tablet, Take 1  tablet (15 mg total) by mouth every 4 (four) hours as needed for pain., Disp: 180 tablet, Rfl: 0 .  pantoprazole (PROTONIX) 40 MG tablet, Take 1 tablet (40 mg total) by mouth every morning. Take 30 minutes before breakfast., Disp: 90 tablet, Rfl: 3 .  potassium chloride (K-DUR) 10 MEQ tablet, Take 10 mEq by mouth once a week., Disp: , Rfl:  .  pramipexole (MIRAPEX) 1 MG tablet, Take 1 or 2 tabs at bedtime (Patient not taking: Reported on 12/17/2018), Disp: 60 tablet, Rfl: 2 .  promethazine (PHENERGAN) 25 MG tablet, TAKE 1 TABLET BY MOUTH EVERY 6 HOURS AS NEEDED FOR NAUSEA, Disp: 90 tablet, Rfl: 1 .  simethicone (MYLICON) 80 MG chewable tablet, Chew 160 mg by mouth every 6 (six) hours as needed (gas pain)., Disp: , Rfl:  .  SUMAtriptan (IMITREX) 100 MG tablet, Take 1 tablet earliest onset of migraine.  May repeat once in 2 hours if headache persists or recurs., Disp: 10 tablet, Rfl: 2 .  SYRINGE-NEEDLE, DISP, 3 ML (B-D 3CC LUER-LOK SYR 25GX1") 25G X 1" 3 ML MISC, USE AS DIRECTED, Disp: 9 each, Rfl: 11 .  Vitamin D, Ergocalciferol, (DRISDOL) 1.25 MG (50000 UT) CAPS capsule, TAKE 1 CAPSULE (50,000 UNITS TOTAL) BY MOUTH EVERY 7 (SEVEN) DAYS., Disp: 12 capsule, Rfl: 3  EXAM:  VITALS per patient if applicable:  GENERAL: alert, oriented, appears well and in no acute distress  HEENT: atraumatic, conjunttiva clear, no obvious abnormalities on inspection of external nose and ears  NECK: normal movements of the  head and neck  LUNGS: on inspection no signs of respiratory distress, breathing rate appears normal, no obvious gross SOB, gasping or wheezing  CV: no obvious cyanosis  MS: moves all visible extremities without noticeable abnormality  PSYCH/NEURO: pleasant and cooperative, no obvious depression or anxiety, speech and thought processing grossly intact  ASSESSMENT AND PLAN:  Discussed the following assessment and plan:  High risk medication use - Plan: oxyCODONE (ROXICODONE) 15 MG immediate release tablet, DISCONTINUED: oxyCODONE (ROXICODONE) 15 MG immediate release tablet, DISCONTINUED: oxyCODONE (ROXICODONE) 15 MG immediate release tablet  DDD (degenerative disc disease), lumbar - Plan: oxyCODONE (ROXICODONE) 15 MG immediate release tablet, DISCONTINUED: oxyCODONE (ROXICODONE) 15 MG immediate release tablet, DISCONTINUED: oxyCODONE (ROXICODONE) 15 MG immediate release tablet     I discussed the assessment and treatment plan with the patient. The patient was provided an opportunity to ask questions and all were answered. The patient agreed with the plan and demonstrated an understanding of the instructions.   The patient was advised to call back or seek an in-person evaluation if the symptoms worsen or if the condition fails to improve as anticipated.     Review of Systems     Objective:   Physical Exam        Assessment & Plan:  Pain management, meds were refilled.  Gershon Crane, MD

## 2019-04-19 ENCOUNTER — Ambulatory Visit: Payer: Self-pay | Admitting: Family Medicine

## 2019-04-19 ENCOUNTER — Telehealth (INDEPENDENT_AMBULATORY_CARE_PROVIDER_SITE_OTHER): Payer: BC Managed Care – PPO | Admitting: Family Medicine

## 2019-04-19 ENCOUNTER — Ambulatory Visit: Payer: BC Managed Care – PPO | Admitting: Licensed Clinical Social Worker

## 2019-04-19 ENCOUNTER — Encounter: Payer: Self-pay | Admitting: Family Medicine

## 2019-04-19 ENCOUNTER — Other Ambulatory Visit: Payer: Self-pay

## 2019-04-19 DIAGNOSIS — M79605 Pain in left leg: Secondary | ICD-10-CM

## 2019-04-19 NOTE — Progress Notes (Signed)
Virtual Visit via Telephone Note  I connected with the patient on 04/19/19 at  3:45 PM EST by telephone and verified that I am speaking with the correct person using two identifiers. We attempted to connect virtually but we had technical difficulties with the audio and video.     I discussed the limitations, risks, security and privacy concerns of performing an evaluation and management service by telephone and the availability of in person appointments. I also discussed with the patient that there may be a patient responsible charge related to this service. The patient expressed understanding and agreed to proceed.  Location patient: home Location provider: work or home office Participants present for the call: patient, provider Patient did not have a visit in the prior 7 days to address this/these issue(s).   History of Present Illness: She has questions about symptoms in the left leg that started yesterday. She is worried about the possibility of a blood clot. She had a plane flight last week and she followed my advice during the flight of getting up frequently to move around, of wearing compression stockings, and of taking a daily adult aspirin. Yesterday she developed some intermittent burning type pains and some "twitching" in the anterior lower leg leg that remind her of the nerve pains she used to get. She has a hx of lumbar stenosis with sciatica. The leg and foot have not swollen at all. No SOB or chest pains.    Observations/Objective: Patient sounds cheerful and well on the phone. I do not appreciate any SOB. Speech and thought processing are grossly intact. Patient reported vitals:  Assessment and Plan: It sounds like her leg pains are related to the sciatica. There is very little chance she has a blood clot. She will monitor this and follow up with US of the pain changes or if she develops any swelling in the foot or leg.  Alysia Penna, MD   Follow Up  Instructions:     [phone] 11-20 [phone] 21-30 I did not refer this patient for an OV in the next 24 hours for this/these issue(s).  I discussed the assessment and treatment plan with the patient. The patient was provided an opportunity to ask questions and all were answered. The patient agreed with the plan and demonstrated an understanding of the instructions.   The patient was advised to call back or seek an in-person evaluation if the symptoms worsen or if the condition fails to improve as anticipated.  I provided 11 minutes of non-face-to-face time during this encounter.   Alysia Penna, MD

## 2019-04-19 NOTE — Telephone Encounter (Signed)
Pt reports left shin area "Cramping." Onset yesterday. States intermittent, "More like a pulling sensation." Denies any redness, warmth or swelling to area. States area with cramping mid shin towards ankle. States consulted with Dr. Sarajane Jews regarding a 2 hour flight she took over weekend. Reports took ASA as directed, mobilized on plane during flight. Now taking baby asa as directed. Denies calf pain, "Only my shin area." Pt states "This is the same leg I have nerve pain in from my back issues."   Pt calling to request venous dopplers as she has had in past. Pt with H/O PE in Jan. 2020. [facility]  transferred to practice for consideration of virtual appt today, dopplers referral.   Appt for this afternoon per [facility]. Care advise given; pt verbalizes understanding.  Reason for Disposition . History of prior "blood clot" in leg or lungs (i.e., deep vein thrombosis, pulmonary embolism)  Answer Assessment - Initial Assessment Questions 1. ONSET: "When did the pain start?"      yesterday 2. LOCATION: "Where is the pain located?"         Left shin, middle down towards ankle 3. PAIN: "How bad is the pain?"    (Scale 1-10; or mild, moderate, severe)   -  MILD (1-3): doesn't interfere with normal activities    -  MODERATE (4-7): interferes with normal activities (e.g., work or school) or awakens from sleep, limping    -  SEVERE (8-10): excruciating pain, unable to do any normal activities, unable to walk     "Dull" "Pulling sensation, "Cramping" 4. WORK OR EXERCISE: "Has there been any recent work or exercise that involved this part of the body?"     Two hour flight over weekend 5. CAUSE: "What do you think is causing the leg pain?"    Unsure 6. OTHER SYMPTOMS: "Do you have any other symptoms?" (e.g., chest pain, back pain, breathing difficulty, swelling, rash, fever, numbness, weakness)    None, H/O nerve pain in that leg.  Protocols used: LEG PAIN-A-AH

## 2019-04-21 ENCOUNTER — Ambulatory Visit: Payer: BC Managed Care – PPO | Admitting: Family Medicine

## 2019-04-26 ENCOUNTER — Other Ambulatory Visit: Payer: Self-pay

## 2019-04-26 ENCOUNTER — Telehealth: Payer: Self-pay

## 2019-04-26 DIAGNOSIS — Z79899 Other long term (current) drug therapy: Secondary | ICD-10-CM

## 2019-04-26 DIAGNOSIS — M5136 Other intervertebral disc degeneration, lumbar region: Secondary | ICD-10-CM

## 2019-04-26 NOTE — Telephone Encounter (Signed)
I understand. She has enough to last until the middle of December. Have her contact us then and we will refill it then

## 2019-04-26 NOTE — Telephone Encounter (Signed)
Received fax from pharmacy stating that pt could not pick up the 180 tabs of oxycodone on 04/21/2019. Per pharmacy pt only picked up 169 tablets due to Rx being out of stock.

## 2019-05-01 NOTE — Telephone Encounter (Signed)
Patient notified of update  and verbalized understanding. 

## 2019-05-03 ENCOUNTER — Ambulatory Visit (INDEPENDENT_AMBULATORY_CARE_PROVIDER_SITE_OTHER): Payer: BC Managed Care – PPO | Admitting: Licensed Clinical Social Worker

## 2019-05-03 ENCOUNTER — Other Ambulatory Visit: Payer: Self-pay | Admitting: Family Medicine

## 2019-05-03 DIAGNOSIS — F331 Major depressive disorder, recurrent, moderate: Secondary | ICD-10-CM | POA: Diagnosis not present

## 2019-05-05 ENCOUNTER — Other Ambulatory Visit: Payer: Self-pay | Admitting: Family Medicine

## 2019-05-05 DIAGNOSIS — R262 Difficulty in walking, not elsewhere classified: Secondary | ICD-10-CM | POA: Diagnosis not present

## 2019-05-05 DIAGNOSIS — M5136 Other intervertebral disc degeneration, lumbar region: Secondary | ICD-10-CM | POA: Diagnosis not present

## 2019-05-05 DIAGNOSIS — R293 Abnormal posture: Secondary | ICD-10-CM | POA: Diagnosis not present

## 2019-05-05 DIAGNOSIS — M545 Low back pain: Secondary | ICD-10-CM | POA: Diagnosis not present

## 2019-05-13 ENCOUNTER — Other Ambulatory Visit: Payer: Self-pay | Admitting: Family Medicine

## 2019-05-15 ENCOUNTER — Emergency Department (HOSPITAL_BASED_OUTPATIENT_CLINIC_OR_DEPARTMENT_OTHER)
Admission: EM | Admit: 2019-05-15 | Discharge: 2019-05-16 | Disposition: A | Discharge status: Home / Self Care | Payer: BC Managed Care – PPO | Attending: Emergency Medicine | Admitting: Emergency Medicine

## 2019-05-15 ENCOUNTER — Other Ambulatory Visit: Payer: Self-pay

## 2019-05-15 ENCOUNTER — Encounter (HOSPITAL_BASED_OUTPATIENT_CLINIC_OR_DEPARTMENT_OTHER): Payer: Self-pay

## 2019-05-15 ENCOUNTER — Emergency Department (HOSPITAL_BASED_OUTPATIENT_CLINIC_OR_DEPARTMENT_OTHER): Payer: BC Managed Care – PPO

## 2019-05-15 DIAGNOSIS — I10 Essential (primary) hypertension: Secondary | ICD-10-CM | POA: Insufficient documentation

## 2019-05-15 DIAGNOSIS — Z87891 Personal history of nicotine dependence: Secondary | ICD-10-CM | POA: Diagnosis not present

## 2019-05-15 DIAGNOSIS — M79605 Pain in left leg: Secondary | ICD-10-CM

## 2019-05-15 DIAGNOSIS — M7989 Other specified soft tissue disorders: Secondary | ICD-10-CM | POA: Diagnosis not present

## 2019-05-15 DIAGNOSIS — M79662 Pain in left lower leg: Secondary | ICD-10-CM | POA: Diagnosis not present

## 2019-05-15 MED ORDER — DIAZEPAM 2 MG PO TABS
2.0000 mg | ORAL_TABLET | Freq: Once | ORAL | Status: AC
  Administered 2019-05-16: 2 mg via ORAL
  Filled 2019-05-15: qty 1

## 2019-05-15 NOTE — ED Notes (Signed)
Patient transported to Ultrasound 

## 2019-05-15 NOTE — ED Triage Notes (Addendum)
Pt c/o L leg pain and swelling x1 day. Pt reports a history of blood clots and is concerned for a DVT today. Pt has taken 15mg  oxycodone and then a 4mg  dilaudid tablet with minimal relief.

## 2019-05-15 NOTE — ED Notes (Signed)
ED Provider at bedside. 

## 2019-05-15 NOTE — ED Notes (Signed)
Returned from ultrasound.

## 2019-05-16 MED ORDER — DIAZEPAM 5 MG PO TABS: 2.5000 mg | ORAL_TABLET | Freq: Four times a day (QID) | ORAL | 0 refills | Status: DC | PRN

## 2019-05-16 NOTE — ED Provider Notes (Signed)
Emergency Department Provider Note   I have reviewed the triage vital signs and the nursing notes.   HISTORY  Chief Complaint Leg Pain   HPI [name] is a 43 y.o. female with a history of DVT and multiple other musculoskeletal medical problems to include multiple back surgeries and knee injuries presents the emergency department today with left leg pain.  Patient states she had atraumatic left leg pain to radiate down from her left thigh to her left calf.  She has a history of DVTs and was concerned this might be it after her husband stated that it did feel warm in that area.  No chest pain or shortness of breath.  Once again no trauma.  She has some bilateral gluteal pain but nothing it radiates all way down to her leg.  She does have right knee ACL injury pending total knee arthroplasty.   No other associated or modifying symptoms.    Past Medical History:  Diagnosis Date  . Allergy   . Anxiety   . Arthritis   . B12 deficiency   . Chronic narcotic use   . Depression    not currently   . Dysrhythmia    hx tachy-was from medication nortriptylline  . Esophagitis    rx  . Fibromyalgia   . GERD (gastroesophageal reflux disease)   . History of echocardiogram    Echo 4/18: Mild concentric LVH, vigorous LVF, EF 65-70, normal wall motion, grade 1 diastolic dysfunction  . History of kidney stones   . Hyperlipidemia   . Intervertebral disc protrusion 01/11/2014  . Migraine   . Obesity (BMI 35.0-39.9 without comorbidity)   . Peripheral neuropathy   . Polycystic ovaries   . Pulmonary embolus (HCC)   . Spinal stenosis, lumbar   . Vitamin D deficiency     Patient Active Problem List   Diagnosis Date Noted  . Sepsis (HCC) 06/21/2018  . Leukocytosis 06/21/2018  . Hypertensive urgency 06/21/2018  . SIRS (systemic inflammatory response syndrome) (HCC) 06/17/2018  . Fever   . Precordial pain   . Elevated lactic acid level   . Bandemia   . Migraine 06/07/2018  .  Pulmonary embolism (HCC) 06/06/2018  . DDD (degenerative disc disease), lumbar 05/04/2018  . Atypical chest pain   . Spinal stenosis of lumbar region 04/01/2015  . Chronic migraine without aura without status migrainosus, not intractable 03/04/2015  . Elevated plasma metanephrines 11/16/2014  . Alopecia 05/21/2014  . Tachycardia 04/04/2014  . Sciatica 01/11/2014  . Back pain 01/11/2014  . Intervertebral disc protrusion 01/11/2014  . HTN (hypertension) 01/04/2013  . Hemorrhoids 04/25/2012  . Urinary retention 12/11/2010  . CERUMEN IMPACTION 01/28/2010  . Depression 04/08/2009  . Myalgia 10/11/2008  . Morbid obesity (HCC) 08/10/2008  . ACNE VULGARIS 08/10/2008  . INSOMNIA 06/20/2008  . VITAMIN B12 DEFICIENCY 03/23/2008  . Hereditary and idiopathic peripheral neuropathy 02/27/2008  . ANXIETY DISORDER, GENERALIZED 01/04/2007  . GERD (gastroesophageal reflux disease) 01/04/2007  . DEGENERATIVE JOINT DISEASE, KNEES, BILATERAL 01/04/2007  . RENAL CALCULUS, HX OF 01/04/2007    Past Surgical History:  Procedure Laterality Date  . 24 HOUR PH STUDY N/A 05/27/2016   Procedure: 24 HOUR PH STUDY;  Surgeon: Ruffin Frederick, MD;  Location: [facility];  Service: Gastroenterology;  Laterality: N/A;  . ABDOMINAL EXPOSURE N/A 05/04/2018   Procedure: ABDOMINAL EXPOSURE;  Surgeon: Larina Earthly, MD;  Location: [facility] OR;  Service: Vascular;  Laterality: N/A;  . ANTERIOR LUMBAR FUSION N/A 05/04/2018  Procedure: Lumbar Five Sacral One Anterior lumbar interbody fusion;  Surgeon: Kary Kos, MD;  Location: [place];  Service: Neurosurgery;  Laterality: N/A;  Lumbar Five Sacral One Anterior lumbar interbody fusion  . APPENDECTOMY    . BACK SURGERY  2012   left laminectomy at L3-4 per Dr. Luiz Ochoa   . bladder tack  2012  . CHOLECYSTECTOMY N/A 10/03/2015   Procedure: LAPAROSCOPIC CHOLECYSTECTOMY;  Surgeon: Mickeal Skinner, MD;  Location: [place];  Service: General;  Laterality: N/A;  . ESOPHAGEAL  MANOMETRY N/A 05/27/2016   Procedure: ESOPHAGEAL MANOMETRY (EM);  Surgeon: Manus Gunning, MD;  Location: [facility];  Service: Gastroenterology;  Laterality: N/A;  . EXCISION OF SKIN TAG  10/03/2015   Procedure: EXCISION OF SKIN TAG;  Surgeon: Arta Bruce Kinsinger, MD;  Location: [place];  Service: General;;  . LUMBAR [place] SURGERY  01-31-14   per Dr. Saintclair Halsted   . OOPHORECTOMY     left overy  . OVARIAN CYST REMOVAL    . RECTOCELE REPAIR    . SINUSOTOMY  12/14/06  . spinal fusion  2016/10  . TONSILLECTOMY    . TONSILLECTOMY    . VAGINAL HYSTERECTOMY    . WRIST GANGLION EXCISION Left     Current Outpatient Rx  . Order #: [id number] Class: Normal  . Order #: [id number] Class: Normal  . Order #: [id number] Class: Normal  . Order #: [id number] Class: Historical Med  . Order #: [id number] Class: Historical Med  . Order #: [id number] Class: Normal  . Order #: [id number] Class: Historical Med  . Order #: [id number] Class: Normal  . Order #: [id number] Class: Historical Med  . Order #: [id number] Class: Normal  . Order #: [id number] Class: Historical Med  . Order #: [id number] Class: Normal  . Order #: [id number] Class: Normal  . Order #: [id number] Class: Normal  . [START ON 06/20/2019] Order #: [id number] Class: Normal  . Order #: [id number] Class: Historical Med  . Order #: [id number] Class: Historical Med  . [START ON 06/20/2019] Order #: [id number] Class: Normal  . Order #: [id number] Class: Normal  . Order #: [id number] Class: Historical Med  . Order #: [id number] Class: Normal  . Order #: [id number] Class: Normal  . Order #: [id number] Class: Historical Med  . Order #: [id number] Class: Normal  . Order #: [id number] Class: Normal  . Order #: [id number] Class: Normal    Allergies Gadolinium derivatives, Other, and Zoloft [sertraline hcl]  Family History  Problem Relation Age of Onset  . Fibromyalgia Mother   . Diabetes Mother   . Thyroid cancer Mother   . Liver disease Mother   . Neuropathy Mother   . Cirrhosis Mother         non-alcoholic  . Pulmonary embolism Mother        both lungs  . Heart failure Mother   . Hypertension Father   . Diabetes Father   . Heart disease Father   . Prostate cancer Father   . Heart attack Father        x 2  . Colon cancer Neg Hx   . Other Neg Hx        pheochromocytoma  . Colon polyps Neg Hx     Social History Social History   Tobacco Use  . Smoking status: Former Smoker    Types: Cigarettes    Quit date: 1999    Years since quitting: 21.9  . Smokeless tobacco: Never Used  Substance Use Topics  . Alcohol use: No    Alcohol/week: 0.0 standard drinks  . Drug use: No    Review of Systems  All other systems negative except as documented in the HPI. All pertinent positives and negatives as reviewed in the HPI. ____________________________________________   PHYSICAL EXAM:  VITAL SIGNS: ED Triage Vitals  Enc Vitals Group     BP 05/15/19 2138 (!) 141/101     Pulse Rate 05/15/19 2138 (!) 112     Resp 05/15/19 2138 20     Temp 05/15/19 2138 98.6 F (37 C)     Temp Source 05/15/19 2138 Oral     SpO2 05/15/19 2138 95 %     Weight 05/15/19 2142 270 lb (122.5 kg)     Height 05/15/19 2142 5\' 9"  (1.753 m)    Constitutional: Alert and oriented. Well appearing and in no acute distress. Eyes: Conjunctivae are normal. PERRL. EOMI. Head: Atraumatic. Nose: No congestion/rhinnorhea. Mouth/Throat: Mucous membranes are moist.  Oropharynx non-erythematous. Neck: No stridor.  No meningeal signs.   Cardiovascular: Normal rate, regular rhythm. Good peripheral circulation. Grossly normal heart sounds.   Respiratory: Normal respiratory effort.  No retractions. Lungs CTAB. Gastrointestinal: Soft and nontender. No distention.  Musculoskeletal: No lower extremity tenderness nor edema. No gross deformities of extremities. Neurologic:  Normal speech and language. No gross focal neurologic deficits are appreciated.  Skin:  Skin is warm, dry and intact. No rash  noted.   ____________________________________________    RADIOLOGY  Venous Img Lower Unilateral Left  Result Date: 05/15/2019 CLINICAL DATA:  Initial evaluation for acute left lower extremity pain and swelling for 1 day. History of PE. EXAM: Left LOWER EXTREMITY VENOUS DOPPLER ULTRASOUND TECHNIQUE: Gray-scale sonography with graded compression, as well as color Doppler and duplex ultrasound were performed to evaluate the lower extremity deep venous systems from the level of the common femoral vein and including the common femoral, femoral, profunda femoral, popliteal and calf veins including the posterior tibial, peroneal and gastrocnemius veins when visible. The superficial great saphenous vein was also interrogated. Spectral Doppler was utilized to evaluate flow at rest and with distal augmentation maneuvers in the common femoral, femoral and popliteal veins. COMPARISON:  None. FINDINGS: Contralateral Common Femoral Vein: Respiratory phasicity is normal and symmetric with the symptomatic side. No evidence of thrombus. Normal compressibility. Common Femoral Vein: No evidence of thrombus. Normal compressibility, respiratory phasicity and response to augmentation. Saphenofemoral Junction: No evidence of thrombus. Normal compressibility and flow on color Doppler imaging. Profunda Femoral Vein: No evidence of thrombus. Normal compressibility and flow on color Doppler imaging. Femoral Vein: No evidence of thrombus. Normal compressibility, respiratory phasicity and response to augmentation. Popliteal Vein: No evidence of thrombus. Normal compressibility, respiratory phasicity and response to augmentation. Calf Veins: No visible thrombus. Left peroneal vein not well visualized. Normal compressibility and flow on color Doppler imaging. Superficial Great Saphenous Vein: No evidence of thrombus. Normal compressibility. Venous Reflux:  None. Other Findings:  None. IMPRESSION: No evidence of deep venous  thrombosis. Electronically Signed   By: 05/17/2019 M.D.   On: 05/15/2019 22:41    ____________________________________________   PROCEDURES  Procedure(s) performed:   Procedures   ____________________________________________   INITIAL IMPRESSION / ASSESSMENT AND PLAN / ED COURSE  Suspect that she is favoring her right knee and overusing her left side and this is the reason why she has this symptom.  Pain is improved but not gone my evaluation.  She does have a palpable spasm in her lateral leg so we will try some muscle relaxers to see if that helps otherwise follow-up with PCP.   Pertinent labs & imaging results  that were available during my care of the patient were reviewed by me and considered in my medical decision making (see chart for details).  A medical screening exam was performed and I feel the patient has had an appropriate workup for their chief complaint at this time and likelihood of emergent condition existing is low. They have been counseled on decision, discharge, follow up and which symptoms necessitate immediate return to the emergency department. They or their family verbally stated understanding and agreement with plan and discharged in stable condition.   ____________________________________________  FINAL CLINICAL IMPRESSION(S) / ED DIAGNOSES  Final diagnoses:  Left leg pain     MEDICATIONS GIVEN DURING THIS VISIT:  Medications  diazepam (VALIUM) tablet 2 mg (2 mg Oral Given 05/16/19 0003)     NEW OUTPATIENT MEDICATIONS STARTED DURING THIS VISIT:  Discharge Medication List as of 05/16/2019 12:27 AM    START taking these medications   Details  diazepam (VALIUM) 5 MG tablet Take 0.5 tablets (2.5 mg total) by mouth every 6 (six) hours as needed (muscle spasm/pain)., Starting Tue 05/16/2019, Normal        Note:  This note was prepared with assistance of Dragon voice recognition software. Occasional wrong-word or sound-a-like  substitutions may have occurred due to the inherent limitations of voice recognition software.   Johm Pfannenstiel, Barbara CowerJason, MD 05/16/19 [phone]

## 2019-05-17 ENCOUNTER — Ambulatory Visit: Payer: BC Managed Care – PPO | Admitting: Licensed Clinical Social Worker

## 2019-05-31 ENCOUNTER — Ambulatory Visit: Payer: BC Managed Care – PPO | Admitting: Licensed Clinical Social Worker

## 2019-06-07 ENCOUNTER — Ambulatory Visit: Payer: Self-pay | Admitting: *Deleted

## 2019-06-07 ENCOUNTER — Telehealth: Payer: Self-pay

## 2019-06-07 ENCOUNTER — Telehealth (INDEPENDENT_AMBULATORY_CARE_PROVIDER_SITE_OTHER): Payer: BC Managed Care – PPO | Admitting: Family Medicine

## 2019-06-07 ENCOUNTER — Other Ambulatory Visit: Payer: Self-pay

## 2019-06-07 ENCOUNTER — Telehealth: Payer: Self-pay | Admitting: Family Medicine

## 2019-06-07 DIAGNOSIS — R1013 Epigastric pain: Secondary | ICD-10-CM | POA: Diagnosis not present

## 2019-06-07 DIAGNOSIS — K219 Gastro-esophageal reflux disease without esophagitis: Secondary | ICD-10-CM | POA: Diagnosis not present

## 2019-06-07 MED ORDER — SUCRALFATE 1 GM/10ML PO SUSP: 1.0000 g | Freq: Three times a day (TID) | ORAL | 2 refills | Status: DC

## 2019-06-07 NOTE — Telephone Encounter (Signed)
Patient has an appointment with PCP today at 1:30 PM

## 2019-06-07 NOTE — Progress Notes (Signed)
Virtual Visit via Video Note  I connected with the patient on 06/07/19 at  1:30 PM EST by a video enabled telemedicine application and verified that I am speaking with the correct person using two identifiers.  Location patient: home Location provider:work or home office Persons participating in the virtual visit: patient, provider  I discussed the limitations of evaluation and management by telemedicine and the availability of in person appointments. The patient expressed understanding and agreed to proceed.   HPI: Here for several weeks of nausea without vomiting, belching, heartburn, and epigastric pain. No fever or headache or ST. No body aches or cough or SOB. No diarrhea. She has severe GERD, and she usually takes one 40 mg Protonix in the am and one 40 mg Famotidine in the PM. Then a month ago she took a plane flight and increased her daily ASA to 325 mg from 81 for several days. Her GERD quickly got out of control, and she thinks it was due to the ASA. She has been back on 81 mg daily since then. For the past few weeks she has doubled up on the Protonix to take 2 tabs (80 mg) each morning and still the Famotidine at night. The heartburn has resolved but she still has epigastric pain and nausea.    ROS: See pertinent positives and negatives per HPI.  Past Medical History:  Diagnosis Date  . Allergy   . Anxiety   . Arthritis   . B12 deficiency   . Chronic narcotic use   . Depression    not currently   . Dysrhythmia    hx tachy-was from medication nortriptylline  . Esophagitis    rx  . Fibromyalgia   . GERD (gastroesophageal reflux disease)   . History of echocardiogram    Echo 4/18: Mild concentric LVH, vigorous LVF, EF 65-70, normal wall motion, grade 1 diastolic dysfunction  . History of kidney stones   . Hyperlipidemia   . Intervertebral disc protrusion 01/11/2014  . Migraine   . Obesity (BMI 35.0-39.9 without comorbidity)   . Peripheral neuropathy   . Polycystic  ovaries   . Pulmonary embolus (HCC)   . Spinal stenosis, lumbar   . Vitamin D deficiency     Past Surgical History:  Procedure Laterality Date  . 24 HOUR PH STUDY N/A 05/27/2016   Procedure: 24 HOUR PH STUDY;  Surgeon: Ruffin Frederick, MD;  Location: [facility];  Service: Gastroenterology;  Laterality: N/A;  . ABDOMINAL EXPOSURE N/A 05/04/2018   Procedure: ABDOMINAL EXPOSURE;  Surgeon: Larina Earthly, MD;  Location[facility] OR;  Service: Vascular;  Laterality: N/A;  . ANTERIOR LUMBAR FUSION N/A 05/04/2018   Procedure: Lumbar Five Sacral One Anterior lumbar interbody fusion;  Surgeon: Donalee Citrin, MD;  Location: [facility] OR;  Service: Neurosurgery;  Laterality: N/A;  Lumbar Five Sacral One Anterior lumbar interbody fusion  . APPENDECTOMY    . BACK SURGERY  2012   left laminectomy at L3-4 per Dr. Phoebe Perch   . bladder tack  2012  . CHOLECYSTECTOMY N/A 10/03/2015   Procedure: LAPAROSCOPIC CHOLECYSTECTOMY;  Surgeon: Rodman Pickle, MD;  Location: [facility] OR;  Service: General;  Laterality: N/A;  . ESOPHAGEAL MANOMETRY N/A 05/27/2016   Procedure: ESOPHAGEAL MANOMETRY (EM);  Surgeon: Ruffin Frederick, MD;  Location: [facility];  Service: Gastroenterology;  Laterality: N/A;  . EXCISION OF SKIN TAG  10/03/2015   Procedure: EXCISION OF SKIN TAG;  Surgeon: De Blanch Kinsinger, MD;  Location: [facility] OR;  Service: General;;  .  LUMBAR DISC SURGERY  01-31-14   per Dr. [name]   . OOPHORECTOMY     left overy  . OVARIAN CYST REMOVAL    . RECTOCELE REPAIR    . SINUSOTOMY  12/14/06  . spinal fusion  2016/10  . TONSILLECTOMY    . TONSILLECTOMY    . VAGINAL HYSTERECTOMY    . WRIST GANGLION EXCISION Left     Family History  Problem Relation Age of Onset  . Fibromyalgia Mother   . Diabetes Mother   . Thyroid cancer Mother   . Liver disease Mother   . Neuropathy Mother   . Cirrhosis Mother        non-alcoholic  . Pulmonary embolism Mother        both lungs  . Heart failure Mother   . Hypertension  Father   . Diabetes Father   . Heart disease Father   . Prostate cancer Father   . Heart attack Father        x 2  . Colon cancer Neg Hx   . Other Neg Hx        pheochromocytoma  . Colon polyps Neg Hx      Current Outpatient Medications:  .  ALPRAZolam (XANAX) 0.5 MG tablet, TAKE 1 TABLET BY MOUTH 3 TIMES A DAY AS NEEDED, Disp: 90 tablet, Rfl: 5 .  ALPRAZolam (XANAX) 0.5 MG tablet, Take 1 tablet (0.5 mg total) by mouth 3 (three) times daily as needed., Disp: 20 tablet, Rfl: 0 .  amoxicillin-clavulanate (AUGMENTIN) 875-125 MG tablet, Take 1 tablet by mouth 2 (two) times daily., Disp: 20 tablet, Rfl: 0 .  calcium carbonate (TUMS - DOSED IN MG ELEMENTAL CALCIUM) 500 MG chewable tablet, Chew 1 tablet by mouth 2 (two) times daily as needed for indigestion or heartburn., Disp: , Rfl:  .  cetirizine (ZYRTEC) 10 MG tablet, Take 10 mg by mouth daily., Disp: , Rfl:  .  cyanocobalamin (,VITAMIN B-12,) 1000 MCG/ML injection, INJECT 1 ML (1,000 MCG TOTAL) INTO THE MUSCLE EVERY 7 DAYS., Disp: 4 mL, Rfl: 8 .  cyclobenzaprine (FLEXERIL) 10 MG tablet, Take 10 mg by mouth 3 (three) times daily as needed for muscle spasms., Disp: , Rfl:  .  diazepam (VALIUM) 5 MG tablet, Take 0.5 tablets (2.5 mg total) by mouth every 6 (six) hours as needed (muscle spasm/pain)., Disp: 10 tablet, Rfl: 0 .  docusate sodium (COLACE) 100 MG capsule, Take 100 mg by mouth at bedtime as needed. , Disp: , Rfl:  .  famotidine (PEPCID) 40 MG tablet, TAKE 1 TABLET BY MOUTH EVERYDAY AT BEDTIME, Disp: 30 tablet, Rfl: 5 .  fluticasone (FLONASE) 50 MCG/ACT nasal spray, Place 2 sprays into both nostrils every evening. , Disp: , Rfl:  .  gabapentin (NEURONTIN) 100 MG capsule, Take 1 capsule (100 mg total) by mouth 3 (three) times daily., Disp: 90 capsule, Rfl: 5 .  gabapentin (NEURONTIN) 300 MG capsule, TAKE 1 CAPSULE BY MOUTH THREE TIMES A DAY (Patient not taking: Reported on 12/17/2018), Disp: 90 capsule, Rfl: 5 .  hydrochlorothiazide  (HYDRODIURIL) 25 MG tablet, TAKE 1 TABLET BY MOUTH EVERY DAY, Disp: 30 tablet, Rfl: 5 .  [START ON 06/20/2019] HYDROmorphone (DILAUDID) 8 MG tablet, Take 1 tablet (8 mg total) by mouth every 6 (six) hours as needed for severe pain., Disp: 30 tablet, Rfl: 0 .  Multiple Vitamins-Minerals (MULTI-VITAMIN GUMMIES PO), Take 2 tablets by mouth daily. , Disp: , Rfl:  .  nitrofurantoin, macrocrystal-monohydrate, (MACROBID) 100 MG capsule,  Take 100 mg by mouth daily as needed (for uti prophylaxis, after intercourse). , Disp: , Rfl: 2 .  [START ON 06/20/2019] oxyCODONE (ROXICODONE) 15 MG immediate release tablet, Take 1 tablet (15 mg total) by mouth every 4 (four) hours as needed for pain., Disp: 180 tablet, Rfl: 0 .  pantoprazole (PROTONIX) 40 MG tablet, Take 1 tablet (40 mg total) by mouth every morning. Take 30 minutes before breakfast., Disp: 90 tablet, Rfl: 3 .  potassium chloride (K-DUR) 10 MEQ tablet, Take 10 mEq by mouth once a week., Disp: , Rfl:  .  pramipexole (MIRAPEX) 1 MG tablet, Take 1 or 2 tabs at bedtime (Patient not taking: Reported on 12/17/2018), Disp: 60 tablet, Rfl: 2 .  promethazine (PHENERGAN) 25 MG tablet, TAKE 1 TABLET BY MOUTH EVERY 6 HOURS AS NEEDED FOR NAUSEA, Disp: 90 tablet, Rfl: 1 .  simethicone (MYLICON) 80 MG chewable tablet, Chew 160 mg by mouth every 6 (six) hours as needed (gas pain)., Disp: , Rfl:  .  sucralfate (CARAFATE) 1 GM/10ML suspension, Take 10 mLs (1 g total) by mouth 4 (four) times daily -  with meals and at bedtime., Disp: 420 mL, Rfl: 2 .  SUMAtriptan (IMITREX) 100 MG tablet, Take 1 tablet earliest onset of migraine.  May repeat once in 2 hours if headache persists or recurs., Disp: 10 tablet, Rfl: 2 .  SYRINGE-NEEDLE, DISP, 3 ML (B-D 3CC LUER-LOK SYR 25GX1") 25G X 1" 3 ML MISC, USE AS DIRECTED, Disp: 9 each, Rfl: 11 .  Vitamin D, Ergocalciferol, (DRISDOL) 1.25 MG (50000 UT) CAPS capsule, TAKE 1 CAPSULE (50,000 UNITS TOTAL) BY MOUTH EVERY 7 (SEVEN) DAYS., Disp: 12  capsule, Rfl: 3  EXAM:  VITALS per patient if applicable:  GENERAL: alert, oriented, appears well and in no acute distress  HEENT: atraumatic, conjunttiva clear, no obvious abnormalities on inspection of external nose and ears  NECK: normal movements of the head and neck  LUNGS: on inspection no signs of respiratory distress, breathing rate appears normal, no obvious gross SOB, gasping or wheezing  CV: no obvious cyanosis  MS: moves all visible extremities without noticeable abnormality  PSYCH/NEURO: pleasant and cooperative, no obvious depression or anxiety, speech and thought processing grossly intact  ASSESSMENT AND PLAN: Her GERD is not well controlled, and on top of that she likely has side effects from taking too much Protonix. I advisedher to decrease the Protonix to one tab in the am an stay on the Famotidine at night. She will add Sucralfate suspension QID. She is scheduled for a well exam with me next week, and we will follow up on this at that time.  Alysia Penna, MD  Discussed the following assessment and plan:  No diagnosis found.     I discussed the assessment and treatment plan with the patient. The patient was provided an opportunity to ask questions and all were answered. The patient agreed with the plan and demonstrated an understanding of the instructions.   The patient was advised to call back or seek an in-person evaluation if the symptoms worsen or if the condition fails to improve as anticipated.

## 2019-06-07 NOTE — Telephone Encounter (Signed)
Copied from CRM [id number]. Topic: General - Inquiry >> Jun 07, 2019  3:42 PM Floria Raveling A wrote: Pt called in stated that the meds that was sent in today , there is not pharmacy anywhere around here that carries the liquid form.  She would like to know if Dr fry could call this in in pill form?   Pharmacy - [facility] on [address]

## 2019-06-07 NOTE — Telephone Encounter (Signed)
Pt called with complaints of abdominal pain, and feeling like something going on with her acid reflux; she was taking aspirin but did not realize it was an NSAID, on 06/04/19 the pt says it mimics the pain of when she had her gall bladder removed; she is also having nausea; she describes her discomfort as a "dull, achy feeling"; she rates her discomfort at 5 out of 10; burping makes it feel better; she takes protonix and pepcid; the pt says she has not been able to get hr reflux under control since her aspirin dose was increased; she also complains of a coating on hr tongue; recommendations made per nurse triage protocol; she verbalized understanding; the pt sees Dr Clent Ridges, Georgena Spurling; pt transferred to [facility] for scheduling; will route to office for notification.   Reason for Disposition . [1] MODERATE pain (e.g., interferes with normal activities) AND [2] pain comes and goes (cramps) AND [3] present > 24 hours  (Exception: pain with Vomiting or Diarrhea - see that Guideline)  Answer Assessment - Initial Assessment Questions 1. LOCATION: "Where does it hurt?"      Where gall bladder scar is 2. RADIATION: "Does the pain shoot anywhere else?" (e.g., chest, back)    no 3. ONSET: "When did the pain begin?" (e.g., minutes, hours or days ago)     06/04/19 4. SUDDEN: "Gradual or sudden onset?"     Pt does not remember 5. PATTERN "Does the pain come and go, or is it constant?"    - If constant: "Is it getting better, staying the same, or worsening?"      (Note: Constant means the pain never goes away completely; most serious pain is constant and it progresses)     - If intermittent: "How long does it last?" "Do you have pain now?"     (Note: Intermittent means the pain goes away completely between bouts)   intermittent 6. SEVERITY: "How bad is the pain?"  (e.g., Scale 1-10; mild, moderate, or severe)   - MILD (1-3): doesn't interfere with normal activities, abdomen soft and not tender to touch    -  MODERATE (4-7): interferes with normal activities or awakens from sleep, tender to touch    - SEVERE (8-10): excruciating pain, doubled over, unable to do any normal activities      5 out 10 7. RECURRENT SYMPTOM: "Have you ever had this type of abdominal pain before?" If so, ask: "When was the last time?" and "What happened that time?"   yes, before gall bladder removed 8. CAUSE: "What do you think is causing the abdominal pain?"    Acid reflux 9. RELIEVING/AGGRAVATING FACTORS: "What makes it better or worse?" (e.g., movement, antacids, bowel movement)     Burping makes it better 10. OTHER SYMPTOMS: "Has there been any vomiting, diarrhea, constipation, or urine problems?"      nausea 11. PREGNANCY: "Is there any chance you are pregnant?" "When was your last menstrual period?"      No hysterectomy  Protocols used: ABDOMINAL PAIN - [facility]

## 2019-06-08 DIAGNOSIS — U071 COVID-19: Secondary | ICD-10-CM | POA: Diagnosis not present

## 2019-06-08 DIAGNOSIS — Z20828 Contact with and (suspected) exposure to other viral communicable diseases: Secondary | ICD-10-CM | POA: Diagnosis not present

## 2019-06-08 DIAGNOSIS — Z03818 Encounter for observation for suspected exposure to other biological agents ruled out: Secondary | ICD-10-CM | POA: Diagnosis not present

## 2019-06-08 MED ORDER — SUCRALFATE 1 G PO TABS: 1.0000 g | ORAL_TABLET | Freq: Four times a day (QID) | ORAL | 2 refills | Status: DC

## 2019-06-08 NOTE — Addendum Note (Signed)
Addended by: Gershon Crane A on: 06/08/2019 01:00 PM   Modules accepted: Orders

## 2019-06-08 NOTE — Telephone Encounter (Signed)
Patient is aware 

## 2019-06-08 NOTE — Telephone Encounter (Signed)
I sent in the tablets

## 2019-06-09 ENCOUNTER — Telehealth (INDEPENDENT_AMBULATORY_CARE_PROVIDER_SITE_OTHER): Payer: BC Managed Care – PPO | Admitting: Family Medicine

## 2019-06-09 ENCOUNTER — Other Ambulatory Visit: Payer: Self-pay

## 2019-06-09 DIAGNOSIS — K047 Periapical abscess without sinus: Secondary | ICD-10-CM

## 2019-06-09 DIAGNOSIS — K219 Gastro-esophageal reflux disease without esophagitis: Secondary | ICD-10-CM | POA: Diagnosis not present

## 2019-06-09 DIAGNOSIS — U071 COVID-19: Secondary | ICD-10-CM

## 2019-06-09 MED ORDER — AMOXICILLIN 500 MG PO CAPS: 500.0000 mg | ORAL_CAPSULE | Freq: Three times a day (TID) | ORAL | 0 refills | Status: DC

## 2019-06-09 NOTE — Telephone Encounter (Signed)
Message Routed to PCP CMA 

## 2019-06-09 NOTE — Telephone Encounter (Signed)
Set up a virtual visit today at 1:30

## 2019-06-09 NOTE — Progress Notes (Signed)
Virtual Visit via Telephone Note  I connected with the patient on 06/09/19 at  1:30 PM EST by telephone and verified that I am speaking with the correct person using two identifiers.   I discussed the limitations, risks, security and privacy concerns of performing an evaluation and management service by telephone and the availability of in person appointments. I also discussed with the patient that there may be a patient responsible charge related to this service. The patient expressed understanding and agreed to proceed.  Location patient: home Location provider: work or home office Participants present for the call: patient, provider Patient did not have a visit in the prior 7 days to address this/these issue(s).   History of Present Illness: Here for several issues. We spoke 2 days ago about her GERD issues, and she started on Sucralfate suspension last night. This has been helpful. Also she has had a pain in the left upper mouth area and she thinks this is a dental infection. She tested positive for the Covid-19 virus yesterday, and because of that her dentist will not see her. No fever.    Observations/Objective: Patient sounds cheerful and well on the phone. I do not appreciate any SOB. Speech and thought processing are grossly intact. Patient reported vitals:  Assessment and Plan: She has a Covid virus infection. She will rest and drink fluids ans take Tylenol prn. Her GERD is responding to the Sucralfate. For the dental infection she will try Amoxicillin.  Gershon Crane, MD   Follow Up Instructions:     [phone] [phone] 11-20 [phone] 21-30 I did not refer this patient for an OV in the next 24 hours for this/these issue(s).  I discussed the assessment and treatment plan with the patient. The patient was provided an opportunity to ask questions and all were answered. The patient agreed with the plan and demonstrated an understanding of the instructions.   The patient was  advised to call back or seek an in-person evaluation if the symptoms worsen or if the condition fails to improve as anticipated.  I provided 22 minutes of non-face-to-face time during this encounter.   Gershon Crane, MD

## 2019-06-09 NOTE — Telephone Encounter (Signed)
Patient called in stating she was advised to call and notify PCP that she is positive for COVID-19. Pt is seeking advice on what to do. Please advise.

## 2019-06-09 NOTE — Telephone Encounter (Signed)
Spoke with patient. Appointment has been scheduled

## 2019-06-14 ENCOUNTER — Encounter: Payer: BC Managed Care – PPO | Admitting: Family Medicine

## 2019-06-14 ENCOUNTER — Ambulatory Visit (INDEPENDENT_AMBULATORY_CARE_PROVIDER_SITE_OTHER): Payer: BC Managed Care – PPO | Admitting: Licensed Clinical Social Worker

## 2019-06-14 DIAGNOSIS — F3341 Major depressive disorder, recurrent, in partial remission: Secondary | ICD-10-CM

## 2019-06-16 ENCOUNTER — Other Ambulatory Visit: Payer: Self-pay | Admitting: Family Medicine

## 2019-06-16 NOTE — Telephone Encounter (Signed)
Please advise Rx is not on current med list. 

## 2019-06-21 ENCOUNTER — Encounter: Payer: Self-pay | Admitting: Family Medicine

## 2019-06-22 MED ORDER — AMOXICILLIN-POT CLAVULANATE 875-125 MG PO TABS: 1.0000 | ORAL_TABLET | Freq: Two times a day (BID) | ORAL | 0 refills | Status: DC

## 2019-06-22 NOTE — Telephone Encounter (Signed)
I will use something a little stronger. Call in Augmentin [phone] bid for 10 days

## 2019-06-23 DIAGNOSIS — M1711 Unilateral primary osteoarthritis, right knee: Secondary | ICD-10-CM | POA: Diagnosis not present

## 2019-06-28 ENCOUNTER — Ambulatory Visit (INDEPENDENT_AMBULATORY_CARE_PROVIDER_SITE_OTHER): Payer: BC Managed Care – PPO | Admitting: Licensed Clinical Social Worker

## 2019-06-28 DIAGNOSIS — F3341 Major depressive disorder, recurrent, in partial remission: Secondary | ICD-10-CM | POA: Diagnosis not present

## 2019-07-01 DIAGNOSIS — R072 Precordial pain: Secondary | ICD-10-CM | POA: Insufficient documentation

## 2019-07-01 DIAGNOSIS — Z03818 Encounter for observation for suspected exposure to other biological agents ruled out: Secondary | ICD-10-CM | POA: Diagnosis not present

## 2019-07-01 DIAGNOSIS — R0789 Other chest pain: Secondary | ICD-10-CM | POA: Insufficient documentation

## 2019-07-01 DIAGNOSIS — R509 Fever, unspecified: Secondary | ICD-10-CM | POA: Insufficient documentation

## 2019-07-01 DIAGNOSIS — D72825 Bandemia: Secondary | ICD-10-CM | POA: Insufficient documentation

## 2019-07-01 DIAGNOSIS — R000 Tachycardia, unspecified: Secondary | ICD-10-CM | POA: Diagnosis not present

## 2019-07-01 DIAGNOSIS — Z20828 Contact with and (suspected) exposure to other viral communicable diseases: Secondary | ICD-10-CM | POA: Diagnosis not present

## 2019-07-03 ENCOUNTER — Encounter: Payer: BC Managed Care – PPO | Admitting: Family Medicine

## 2019-07-03 ENCOUNTER — Encounter: Payer: Self-pay | Admitting: Family Medicine

## 2019-07-03 ENCOUNTER — Other Ambulatory Visit: Payer: Self-pay

## 2019-07-03 ENCOUNTER — Ambulatory Visit: Payer: BC Managed Care – PPO | Admitting: Family Medicine

## 2019-07-03 VITALS — BP 140/80 | HR 107 | Temp 98.4°F | Wt 273.4 lb

## 2019-07-03 DIAGNOSIS — F411 Generalized anxiety disorder: Secondary | ICD-10-CM

## 2019-07-03 DIAGNOSIS — I10 Essential (primary) hypertension: Secondary | ICD-10-CM | POA: Diagnosis not present

## 2019-07-03 DIAGNOSIS — E559 Vitamin D deficiency, unspecified: Secondary | ICD-10-CM

## 2019-07-03 DIAGNOSIS — I2699 Other pulmonary embolism without acute cor pulmonale: Secondary | ICD-10-CM

## 2019-07-03 DIAGNOSIS — E538 Deficiency of other specified B group vitamins: Secondary | ICD-10-CM | POA: Diagnosis not present

## 2019-07-03 DIAGNOSIS — D6859 Other primary thrombophilia: Secondary | ICD-10-CM

## 2019-07-03 DIAGNOSIS — F324 Major depressive disorder, single episode, in partial remission: Secondary | ICD-10-CM

## 2019-07-03 DIAGNOSIS — R000 Tachycardia, unspecified: Secondary | ICD-10-CM

## 2019-07-03 DIAGNOSIS — I471 Supraventricular tachycardia: Secondary | ICD-10-CM

## 2019-07-03 NOTE — Progress Notes (Signed)
   Subjective:    Patient ID: [name], female    DOB: [birth year], 44 y.o.   MRN: [id number]  HPI Here with her husband for periods of rapid heart beats. Her resting HR the past week or so has been from 90 to 120, and sometimes it gets as high as 140.  No chest pain or SOB at rest, but she does get winded with the slightest exertion. She admits that she is extremely deconditioned, she is obese, and she gets very little exercise. She spends most of the time she is at home lying in bed. She went to a [facility] this past weekend to get a rapid Covid-19 test (she was negative) because of a recent exposure to someone who tested positive, and she was told that day her HR was 140. She also admits to extreme anxiety at times, and this sends her HR up as well. She takes Xanax TID and this helps a little, but only for a short while. We had agreed for her to see a psychiatrist but she has not taken the time to find one yet due to other urgent family matters she must tend to.    Review of Systems  Constitutional: Positive for fatigue.  Respiratory: Negative.   Cardiovascular: Negative.   Gastrointestinal: Negative.   Genitourinary: Negative.   Neurological: Negative.   Psychiatric/Behavioral: Positive for dysphoric mood and sleep disturbance. Negative for agitation, behavioral problems, confusion and hallucinations. The patient is nervous/anxious.        Objective:   Physical Exam Constitutional:      General: She is not in acute distress.    Appearance: She is obese. She is not ill-appearing.  Cardiovascular:     Rate and Rhythm: Regular rhythm. Tachycardia present.     Pulses: Normal pulses.     Heart sounds: Normal heart sounds.     Comments: EKG today shows a sinus tachycardia  Pulmonary:     Effort: Pulmonary effort is normal.     Breath sounds: Normal breath sounds.  Neurological:     General: No focal deficit present.     Mental Status: She is oriented to person, place, and  time.  Psychiatric:        Behavior: Behavior normal.        Thought Content: Thought content normal.        Judgment: Judgment normal.     Comments: She seems quite anxious            Assessment & Plan:  Tachycardia, this seems to be the result of severe deconditioning, obesity, and high anxiety. I do not think she has any underlying cardiac issues. We considered treating her with a beta blocker, but we agreed that getting the anxiety under control is the most pressing matter. I urged her to see a psychiatrist asap. She will return tomorrow for labs work to rule out thyroid disorders, etc.  Gershon Crane, MD

## 2019-07-04 DIAGNOSIS — M064 Inflammatory polyarthropathy: Secondary | ICD-10-CM | POA: Diagnosis not present

## 2019-07-04 DIAGNOSIS — M1711 Unilateral primary osteoarthritis, right knee: Secondary | ICD-10-CM | POA: Diagnosis not present

## 2019-07-04 DIAGNOSIS — M25561 Pain in right knee: Secondary | ICD-10-CM | POA: Diagnosis not present

## 2019-07-06 ENCOUNTER — Other Ambulatory Visit (INDEPENDENT_AMBULATORY_CARE_PROVIDER_SITE_OTHER): Payer: BC Managed Care – PPO

## 2019-07-06 ENCOUNTER — Other Ambulatory Visit: Payer: Self-pay

## 2019-07-06 DIAGNOSIS — I10 Essential (primary) hypertension: Secondary | ICD-10-CM

## 2019-07-06 DIAGNOSIS — E538 Deficiency of other specified B group vitamins: Secondary | ICD-10-CM | POA: Diagnosis not present

## 2019-07-06 DIAGNOSIS — U071 COVID-19: Secondary | ICD-10-CM

## 2019-07-06 DIAGNOSIS — R000 Tachycardia, unspecified: Secondary | ICD-10-CM

## 2019-07-06 DIAGNOSIS — E559 Vitamin D deficiency, unspecified: Secondary | ICD-10-CM | POA: Diagnosis not present

## 2019-07-06 LAB — CBC WITH DIFFERENTIAL/PLATELET
Basophils Absolute: 0.1 K/uL (ref 0.0–0.1)
Basophils Relative: 0.6 % (ref 0.0–3.0)
Eosinophils Absolute: 0.1 K/uL (ref 0.0–0.7)
Eosinophils Relative: 0.8 % (ref 0.0–5.0)
HCT: 47.7 % — ABNORMAL HIGH (ref 36.0–46.0)
Hemoglobin: 15.6 g/dL — ABNORMAL HIGH (ref 12.0–15.0)
Lymphocytes Relative: 39 % (ref 12.0–46.0)
Lymphs Abs: 5.2 K/uL — ABNORMAL HIGH (ref 0.7–4.0)
MCHC: 32.7 g/dL (ref 30.0–36.0)
MCV: 87.8 fl (ref 78.0–100.0)
Monocytes Absolute: 0.6 K/uL (ref 0.1–1.0)
Monocytes Relative: 4.9 % (ref 3.0–12.0)
Neutro Abs: 7.2 K/uL (ref 1.4–7.7)
Neutrophils Relative %: 54.7 % (ref 43.0–77.0)
Platelets: 358 K/uL (ref 150.0–400.0)
RBC: 5.44 Mil/uL — ABNORMAL HIGH (ref 3.87–5.11)
RDW: 13.3 % (ref 11.5–15.5)
WBC: 13.2 K/uL — ABNORMAL HIGH (ref 4.0–10.5)

## 2019-07-06 LAB — LIPID PANEL
Cholesterol: 260 mg/dL — ABNORMAL HIGH (ref 0–200)
HDL: 39.8 mg/dL (ref >39.00)
LDL Cholesterol: 188 mg/dL — ABNORMAL HIGH (ref 0–99)
NonHDL: 219.98
Total CHOL/HDL Ratio: 7
Triglycerides: 161 mg/dL — ABNORMAL HIGH (ref 0.0–149.0)
VLDL: 32.2 mg/dL (ref 0.0–40.0)

## 2019-07-06 LAB — TSH: TSH: 2.66 u[IU]/mL (ref 0.35–4.50)

## 2019-07-06 LAB — BASIC METABOLIC PANEL
BUN: 19 mg/dL (ref 6–23)
CO2: 32 mEq/L (ref 19–32)
Calcium: 9.1 mg/dL (ref 8.4–10.5)
Chloride: 101 mEq/L (ref 96–112)
Creatinine, Ser: 1 mg/dL (ref 0.40–1.20)
GFR: 59.96 mL/min — ABNORMAL LOW (ref >60.00)
Glucose, Bld: 97 mg/dL (ref 70–99)
Potassium: 3.6 mEq/L (ref 3.5–5.1)
Sodium: 141 mEq/L (ref 135–145)

## 2019-07-06 LAB — HEPATIC FUNCTION PANEL
ALT: 10 U/L (ref 0–35)
AST: 9 U/L (ref 0–37)
Albumin: 4.3 g/dL (ref 3.5–5.2)
Alkaline Phosphatase: 87 U/L (ref 39–117)
Bilirubin, Direct: 0.1 mg/dL (ref 0.0–0.3)
Total Bilirubin: 0.4 mg/dL (ref 0.2–1.2)
Total Protein: 7.1 g/dL (ref 6.0–8.3)

## 2019-07-06 LAB — SARS-COV-2 IGG: SARS-COV-2 IgG: 0.02

## 2019-07-06 LAB — VITAMIN B12: Vitamin B-12: 500 pg/mL (ref 211–911)

## 2019-07-06 LAB — VITAMIN D 25 HYDROXY (VIT D DEFICIENCY, FRACTURES): VITD: 32.11 ng/mL (ref 30.00–100.00)

## 2019-07-06 LAB — T4, FREE: Free T4: 0.86 ng/dL (ref 0.60–1.60)

## 2019-07-06 LAB — T3, FREE: T3, Free: 3.8 pg/mL (ref 2.3–4.2)

## 2019-07-07 ENCOUNTER — Encounter: Payer: Self-pay | Admitting: Family Medicine

## 2019-07-12 ENCOUNTER — Ambulatory Visit: Payer: BC Managed Care – PPO | Admitting: Licensed Clinical Social Worker

## 2019-07-13 DIAGNOSIS — M5440 Lumbago with sciatica, unspecified side: Secondary | ICD-10-CM | POA: Diagnosis not present

## 2019-07-18 ENCOUNTER — Telehealth (INDEPENDENT_AMBULATORY_CARE_PROVIDER_SITE_OTHER): Payer: BC Managed Care – PPO | Admitting: Family Medicine

## 2019-07-18 ENCOUNTER — Other Ambulatory Visit: Payer: Self-pay

## 2019-07-18 DIAGNOSIS — Z79899 Other long term (current) drug therapy: Secondary | ICD-10-CM

## 2019-07-18 DIAGNOSIS — M51369 Other intervertebral disc degeneration, lumbar region without mention of lumbar back pain or lower extremity pain: Secondary | ICD-10-CM

## 2019-07-18 DIAGNOSIS — F1190 Opioid use, unspecified, uncomplicated: Secondary | ICD-10-CM

## 2019-07-18 DIAGNOSIS — M5136 Other intervertebral disc degeneration, lumbar region: Secondary | ICD-10-CM

## 2019-07-18 DIAGNOSIS — M48061 Spinal stenosis, lumbar region without neurogenic claudication: Secondary | ICD-10-CM

## 2019-07-18 DIAGNOSIS — F411 Generalized anxiety disorder: Secondary | ICD-10-CM

## 2019-07-18 MED ORDER — OXYCODONE HCL 15 MG PO TABS: 15.0000 mg | ORAL_TABLET | ORAL | 0 refills | Status: AC | PRN

## 2019-07-18 MED ORDER — HYDROMORPHONE HCL 8 MG PO TABS: 8.0000 mg | ORAL_TABLET | Freq: Four times a day (QID) | ORAL | 0 refills | Status: AC | PRN

## 2019-07-18 MED ORDER — HYDROMORPHONE HCL 8 MG PO TABS: 8.0000 mg | ORAL_TABLET | Freq: Four times a day (QID) | ORAL | 0 refills | Status: DC | PRN

## 2019-07-18 MED ORDER — DIAZEPAM 5 MG PO TABS: 5.0000 mg | ORAL_TABLET | Freq: Three times a day (TID) | ORAL | 5 refills | Status: DC | PRN

## 2019-07-18 MED ORDER — OXYCODONE HCL 15 MG PO TABS: 15.0000 mg | ORAL_TABLET | ORAL | 0 refills | Status: DC | PRN

## 2019-07-18 NOTE — Progress Notes (Signed)
Virtual Visit via Video Note  I connected with the patient on 07/18/19 at  3:30 PM EST by a video enabled telemedicine application and verified that I am speaking with the correct person using two identifiers.  Location patient: home Location provider:work or home office Persons participating in the virtual visit: patient, provider  I discussed the limitations of evaluation and management by telemedicine and the availability of in person appointments. The patient expressed understanding and agreed to proceed.   HPI: Here for pain management. She continues to struggle with back and knee pain. She saw Dr. Wyline Mood recently for her knee, and he has referred her to Dr. Zenovia Jordan for a rheumatology consult. Dahna has been trying CBG gummies to help with pain, and says it is too early to tell if they help or not. Also she has heard that Valium is better for relaxing muscles than Xanax, so she asks if we can switch her anxiety medication from Xanax to Valium.  Indication for chronic opioid: low back pain Medication and dose: Oxycodone 15 mg and Dilaudid 8 mg # pills per month: 180 and 30 Last UDS date: 04-10-19 Opioid Treatment Agreement signed (Y/N): 04-17-19 Opioid Treatment Agreement last reviewed with patient:  07-18-19 [facility] reviewed this encounter (include red flags): Yes    ROS: See pertinent positives and negatives per HPI.  Past Medical History:  Diagnosis Date  . Allergy   . Anxiety   . Arthritis   . B12 deficiency   . Chronic narcotic use   . Depression    not currently   . Dysrhythmia    hx tachy-was from medication nortriptylline  . Esophagitis    rx  . Fibromyalgia   . GERD (gastroesophageal reflux disease)   . History of echocardiogram    Echo 4/18: Mild concentric LVH, vigorous LVF, EF 65-70, normal wall motion, grade 1 diastolic dysfunction  . History of kidney stones   . Hyperlipidemia   . Intervertebral disc protrusion 01/11/2014  . Migraine   . Obesity (BMI  35.0-39.9 without comorbidity)   . Peripheral neuropathy   . Polycystic ovaries   . Pulmonary embolus (HCC)   . Spinal stenosis, lumbar   . Vitamin D deficiency     Past Surgical History:  Procedure Laterality Date  . 24 HOUR PH STUDY N/A 05/27/2016   Procedure: 24 HOUR PH STUDY;  Surgeon: Ruffin Frederick, MD;  Location: [facility];  Service: Gastroenterology;  Laterality: N/A;  . ABDOMINAL EXPOSURE N/A 05/04/2018   Procedure: ABDOMINAL EXPOSURE;  Surgeon: Larina Earthly, MD;  Location[facility] OR;  Service: Vascular;  Laterality: N/A;  . ANTERIOR LUMBAR FUSION N/A 05/04/2018   Procedure: Lumbar Five Sacral One Anterior lumbar interbody fusion;  Surgeon: Donalee Citrin, MD;  Location: [facility] OR;  Service: Neurosurgery;  Laterality: N/A;  Lumbar Five Sacral One Anterior lumbar interbody fusion  . APPENDECTOMY    . BACK SURGERY  2012   left laminectomy at L3-4 per Dr. Phoebe Perch   . bladder tack  2012  . CHOLECYSTECTOMY N/A 10/03/2015   Procedure: LAPAROSCOPIC CHOLECYSTECTOMY;  Surgeon: Rodman Pickle, MD;  Location: [facility] OR;  Service: General;  Laterality: N/A;  . ESOPHAGEAL MANOMETRY N/A 05/27/2016   Procedure: ESOPHAGEAL MANOMETRY (EM);  Surgeon: Ruffin Frederick, MD;  Location: [facility];  Service: Gastroenterology;  Laterality: N/A;  . EXCISION OF SKIN TAG  10/03/2015   Procedure: EXCISION OF SKIN TAG;  Surgeon: De Blanch Kinsinger, MD;  Location: [facility] OR;  Service: General;;  .  LUMBAR DISC SURGERY  01-31-14   per Dr. [name]   . OOPHORECTOMY     left overy  . OVARIAN CYST REMOVAL    . RECTOCELE REPAIR    . SINUSOTOMY  12/14/06  . spinal fusion  2016/10  . TONSILLECTOMY    . TONSILLECTOMY    . VAGINAL HYSTERECTOMY    . WRIST GANGLION EXCISION Left     Family History  Problem Relation Age of Onset  . Fibromyalgia Mother   . Diabetes Mother   . Thyroid cancer Mother   . Liver disease Mother   . Neuropathy Mother   . Cirrhosis Mother        non-alcoholic  . Pulmonary embolism  Mother        both lungs  . Heart failure Mother   . Hypertension Father   . Diabetes Father   . Heart disease Father   . Prostate cancer Father   . Heart attack Father        x 2  . Colon cancer Neg Hx   . Other Neg Hx        pheochromocytoma  . Colon polyps Neg Hx      Current Outpatient Medications:  .  calcium carbonate (TUMS - DOSED IN MG ELEMENTAL CALCIUM) 500 MG chewable tablet, Chew 1 tablet by mouth 2 (two) times daily as needed for indigestion or heartburn., Disp: , Rfl:  .  cetirizine (ZYRTEC) 10 MG tablet, Take 10 mg by mouth daily., Disp: , Rfl:  .  cyanocobalamin (,VITAMIN B-12,) 1000 MCG/ML injection, INJECT 1 ML (1,000 MCG TOTAL) INTO THE MUSCLE EVERY 7 DAYS., Disp: 4 mL, Rfl: 8 .  cyclobenzaprine (FLEXERIL) 10 MG tablet, Take 10 mg by mouth 3 (three) times daily as needed for muscle spasms., Disp: , Rfl:  .  diazepam (VALIUM) 5 MG tablet, Take 1 tablet (5 mg total) by mouth every 8 (eight) hours as needed for muscle spasms (muscle spasm/pain)., Disp: 90 tablet, Rfl: 5 .  docusate sodium (COLACE) 100 MG capsule, Take 100 mg by mouth at bedtime as needed. , Disp: , Rfl:  .  famotidine (PEPCID) 40 MG tablet, TAKE 1 TABLET BY MOUTH EVERYDAY AT BEDTIME, Disp: 30 tablet, Rfl: 5 .  fluconazole (DIFLUCAN) 150 MG tablet, TAKE 1 TABLET BY MOUTH EVERY DAY, Disp: 5 tablet, Rfl: 5 .  fluticasone (FLONASE) 50 MCG/ACT nasal spray, Place 2 sprays into both nostrils every evening. , Disp: , Rfl:  .  gabapentin (NEURONTIN) 100 MG capsule, Take 1 capsule (100 mg total) by mouth 3 (three) times daily., Disp: 90 capsule, Rfl: 5 .  gabapentin (NEURONTIN) 300 MG capsule, TAKE 1 CAPSULE BY MOUTH THREE TIMES A DAY, Disp: 90 capsule, Rfl: 5 .  hydrochlorothiazide (HYDRODIURIL) 25 MG tablet, TAKE 1 TABLET BY MOUTH EVERY DAY, Disp: 30 tablet, Rfl: 5 .  [START ON 09/18/2019] HYDROmorphone (DILAUDID) 8 MG tablet, Take 1 tablet (8 mg total) by mouth every 6 (six) hours as needed for severe pain., Disp:  30 tablet, Rfl: 0 .  Multiple Vitamins-Minerals (MULTI-VITAMIN GUMMIES PO), Take 2 tablets by mouth daily. , Disp: , Rfl:  .  nitrofurantoin, macrocrystal-monohydrate, (MACROBID) 100 MG capsule, Take 100 mg by mouth daily as needed (for uti prophylaxis, after intercourse). , Disp: , Rfl: 2 .  [START ON 09/18/2019] oxyCODONE (ROXICODONE) 15 MG immediate release tablet, Take 1 tablet (15 mg total) by mouth every 4 (four) hours as needed for pain., Disp: 180 tablet, Rfl: 0 .  pantoprazole (  PROTONIX) 40 MG tablet, Take 1 tablet (40 mg total) by mouth every morning. Take 30 minutes before breakfast., Disp: 90 tablet, Rfl: 3 .  potassium chloride (K-DUR) 10 MEQ tablet, Take 10 mEq by mouth once a week., Disp: , Rfl:  .  pramipexole (MIRAPEX) 1 MG tablet, Take 1 or 2 tabs at bedtime, Disp: 60 tablet, Rfl: 2 .  promethazine (PHENERGAN) 25 MG tablet, TAKE 1 TABLET BY MOUTH EVERY 6 HOURS AS NEEDED FOR NAUSEA, Disp: 90 tablet, Rfl: 1 .  simethicone (MYLICON) 80 MG chewable tablet, Chew 160 mg by mouth every 6 (six) hours as needed (gas pain)., Disp: , Rfl:  .  sucralfate (CARAFATE) 1 g tablet, Take 1 tablet (1 g total) by mouth 4 (four) times daily., Disp: 120 tablet, Rfl: 2 .  SUMAtriptan (IMITREX) 100 MG tablet, Take 1 tablet earliest onset of migraine.  May repeat once in 2 hours if headache persists or recurs., Disp: 10 tablet, Rfl: 2 .  SYRINGE-NEEDLE, DISP, 3 ML (B-D 3CC LUER-LOK SYR 25GX1") 25G X 1" 3 ML MISC, USE AS DIRECTED, Disp: 9 each, Rfl: 11 .  Vitamin D, Ergocalciferol, (DRISDOL) 1.25 MG (50000 UT) CAPS capsule, TAKE 1 CAPSULE (50,000 UNITS TOTAL) BY MOUTH EVERY 7 (SEVEN) DAYS., Disp: 12 capsule, Rfl: 3  EXAM:  VITALS per patient if applicable:  GENERAL: alert, oriented, appears well and in no acute distress  HEENT: atraumatic, conjunttiva clear, no obvious abnormalities on inspection of external nose and ears  NECK: normal movements of the head and neck  LUNGS: on inspection no signs of  respiratory distress, breathing rate appears normal, no obvious gross SOB, gasping or wheezing  CV: no obvious cyanosis  MS: moves all visible extremities without noticeable abnormality  PSYCH/NEURO: pleasant and cooperative, no obvious depression or anxiety, speech and thought processing grossly intact  ASSESSMENT AND PLAN: Pain management, meds were refilled. We also changed her from Xanax to Valium. We will see if Dr. Trudie Reed has any other suggestions.  Alysia Penna, MD  Discussed the following assessment and plan:  High risk medication use - Plan: oxyCODONE (ROXICODONE) 15 MG immediate release tablet, DISCONTINUED: oxyCODONE (ROXICODONE) 15 MG immediate release tablet, DISCONTINUED: oxyCODONE (ROXICODONE) 15 MG immediate release tablet  DDD (degenerative disc disease), lumbar - Plan: oxyCODONE (ROXICODONE) 15 MG immediate release tablet, DISCONTINUED: oxyCODONE (ROXICODONE) 15 MG immediate release tablet, DISCONTINUED: oxyCODONE (ROXICODONE) 15 MG immediate release tablet     I discussed the assessment and treatment plan with the patient. The patient was provided an opportunity to ask questions and all were answered. The patient agreed with the plan and demonstrated an understanding of the instructions.   The patient was advised to call back or seek an in-person evaluation if the symptoms worsen or if the condition fails to improve as anticipated.

## 2019-07-20 ENCOUNTER — Other Ambulatory Visit: Payer: Self-pay | Admitting: Family Medicine

## 2019-07-22 DIAGNOSIS — Z20828 Contact with and (suspected) exposure to other viral communicable diseases: Secondary | ICD-10-CM | POA: Diagnosis not present

## 2019-07-26 ENCOUNTER — Ambulatory Visit (INDEPENDENT_AMBULATORY_CARE_PROVIDER_SITE_OTHER): Payer: BC Managed Care – PPO | Admitting: Licensed Clinical Social Worker

## 2019-07-26 ENCOUNTER — Other Ambulatory Visit: Payer: Self-pay | Admitting: Neurosurgery

## 2019-07-26 DIAGNOSIS — F3341 Major depressive disorder, recurrent, in partial remission: Secondary | ICD-10-CM

## 2019-07-26 DIAGNOSIS — M5440 Lumbago with sciatica, unspecified side: Secondary | ICD-10-CM

## 2019-07-31 DIAGNOSIS — Z1231 Encounter for screening mammogram for malignant neoplasm of breast: Secondary | ICD-10-CM | POA: Diagnosis not present

## 2019-08-04 ENCOUNTER — Other Ambulatory Visit: Payer: BC Managed Care – PPO

## 2019-08-06 ENCOUNTER — Other Ambulatory Visit: Payer: Self-pay | Admitting: Family Medicine

## 2019-08-07 ENCOUNTER — Ambulatory Visit
Admission: RE | Admit: 2019-08-07 | Discharge: 2019-08-07 | Disposition: A | Discharge status: Home / Self Care | Payer: BC Managed Care – PPO | Source: Ambulatory Visit | Attending: Neurosurgery | Admitting: Neurosurgery

## 2019-08-07 DIAGNOSIS — M5440 Lumbago with sciatica, unspecified side: Secondary | ICD-10-CM

## 2019-08-07 DIAGNOSIS — M4326 Fusion of spine, lumbar region: Secondary | ICD-10-CM | POA: Diagnosis not present

## 2019-08-09 ENCOUNTER — Ambulatory Visit (INDEPENDENT_AMBULATORY_CARE_PROVIDER_SITE_OTHER): Payer: BC Managed Care – PPO | Admitting: Licensed Clinical Social Worker

## 2019-08-09 DIAGNOSIS — F3341 Major depressive disorder, recurrent, in partial remission: Secondary | ICD-10-CM

## 2019-08-10 ENCOUNTER — Other Ambulatory Visit: Payer: Self-pay | Admitting: Family Medicine

## 2019-08-17 DIAGNOSIS — M461 Sacroiliitis, not elsewhere classified: Secondary | ICD-10-CM | POA: Diagnosis not present

## 2019-08-18 ENCOUNTER — Ambulatory Visit: Payer: BC Managed Care – PPO | Attending: Internal Medicine

## 2019-08-18 DIAGNOSIS — Z23 Encounter for immunization: Secondary | ICD-10-CM

## 2019-08-18 NOTE — Progress Notes (Signed)
   Covid-19 Vaccination Clinic  Name:  [name]    MRN: [id number] DOB: 1975/07/24  08/18/2019  [name] was observed post Covid-19 immunization for 15 minutes without incident. She was provided with Vaccine Information Sheet and instruction to access the V-Safe system.   [name] was instructed to call 911 with any severe reactions post vaccine: Marland Kitchen Difficulty breathing  . Swelling of face and throat  . A fast heartbeat  . A bad rash all over body  . Dizziness and weakness   Immunizations Administered    Name Date Dose VIS Date Route   Pfizer COVID-19 Vaccine 08/18/2019  4:04 PM 0.3 mL 05/12/2019 Intramuscular   Manufacturer: [employer], [employer]   Lot: [id number]   NDC: [address]-9

## 2019-08-21 ENCOUNTER — Other Ambulatory Visit: Payer: Self-pay

## 2019-08-21 ENCOUNTER — Encounter (HOSPITAL_BASED_OUTPATIENT_CLINIC_OR_DEPARTMENT_OTHER): Payer: Self-pay | Admitting: *Deleted

## 2019-08-21 ENCOUNTER — Emergency Department (HOSPITAL_BASED_OUTPATIENT_CLINIC_OR_DEPARTMENT_OTHER): Payer: BC Managed Care – PPO

## 2019-08-21 ENCOUNTER — Emergency Department (HOSPITAL_BASED_OUTPATIENT_CLINIC_OR_DEPARTMENT_OTHER)
Admission: EM | Admit: 2019-08-21 | Discharge: 2019-08-21 | Disposition: A | Discharge status: Home / Self Care | Payer: BC Managed Care – PPO | Attending: Emergency Medicine | Admitting: Emergency Medicine

## 2019-08-21 DIAGNOSIS — M549 Dorsalgia, unspecified: Secondary | ICD-10-CM | POA: Diagnosis not present

## 2019-08-21 DIAGNOSIS — F332 Major depressive disorder, recurrent severe without psychotic features: Secondary | ICD-10-CM | POA: Diagnosis not present

## 2019-08-21 DIAGNOSIS — I10 Essential (primary) hypertension: Secondary | ICD-10-CM | POA: Insufficient documentation

## 2019-08-21 DIAGNOSIS — M7918 Myalgia, other site: Secondary | ICD-10-CM | POA: Diagnosis not present

## 2019-08-21 DIAGNOSIS — Z79899 Other long term (current) drug therapy: Secondary | ICD-10-CM | POA: Diagnosis not present

## 2019-08-21 DIAGNOSIS — M5489 Other dorsalgia: Secondary | ICD-10-CM | POA: Diagnosis not present

## 2019-08-21 DIAGNOSIS — M545 Low back pain: Secondary | ICD-10-CM | POA: Diagnosis not present

## 2019-08-21 DIAGNOSIS — M546 Pain in thoracic spine: Secondary | ICD-10-CM | POA: Diagnosis not present

## 2019-08-21 DIAGNOSIS — Z87891 Personal history of nicotine dependence: Secondary | ICD-10-CM | POA: Insufficient documentation

## 2019-08-21 MED ORDER — HYDROMORPHONE HCL 1 MG/ML IJ SOLN
1.0000 mg | Freq: Once | INTRAMUSCULAR | Status: AC
  Administered 2019-08-21: 1 mg via INTRAVENOUS
  Filled 2019-08-21: qty 1

## 2019-08-21 MED ORDER — METHOCARBAMOL 1000 MG/10ML IJ SOLN
500.0000 mg | Freq: Once | INTRAMUSCULAR | Status: AC
  Administered 2019-08-21: 500 mg via INTRAVENOUS
  Filled 2019-08-21: qty 10

## 2019-08-21 MED ORDER — KETOROLAC TROMETHAMINE 15 MG/ML IJ SOLN
INTRAMUSCULAR | Status: AC
  Filled 2019-08-21: qty 1

## 2019-08-21 MED ORDER — LORAZEPAM 2 MG/ML IJ SOLN
1.0000 mg | Freq: Once | INTRAMUSCULAR | Status: AC
  Administered 2019-08-21: 1 mg via INTRAVENOUS
  Filled 2019-08-21: qty 1

## 2019-08-21 MED ORDER — PREDNISONE 10 MG PO TABS: 40.0000 mg | ORAL_TABLET | Freq: Every day | ORAL | 0 refills | Status: AC

## 2019-08-21 MED ORDER — KETOROLAC TROMETHAMINE 15 MG/ML IJ SOLN
15.0000 mg | Freq: Once | INTRAMUSCULAR | Status: AC
  Administered 2019-08-21: 15 mg via INTRAVENOUS

## 2019-08-21 MED ORDER — PREDNISONE 50 MG PO TABS
60.0000 mg | ORAL_TABLET | Freq: Once | ORAL | Status: AC
  Administered 2019-08-21: 60 mg via ORAL
  Filled 2019-08-21: qty 1

## 2019-08-21 NOTE — Discharge Instructions (Signed)
Your imaging today was reassuring.  Please make sure you follow-up with your surgeon and her primary care doctor. Thank you for allowing me to care for you today. Please return to the emergency department if you have new or worsening symptoms. Take your medications as instructed.

## 2019-08-21 NOTE — ED Triage Notes (Signed)
Back pain. Pain in both of her legs. States she got her first Covid Vaccine 3 days ago.

## 2019-08-21 NOTE — ED Provider Notes (Signed)
[facility] EMERGENCY DEPARTMENT Provider Note   CSN: [id number] Arrival date & time: 08/21/19  1424     History Chief Complaint  Patient presents with   Back Pain    [name] is a 44 y.o. female.  Patient is a 44 year old female past medical history of anxiety, depression, morbid obesity, fibromyalgia, chronic back pain presenting to the emergency department for back pain radiating into her legs.  Patient reports that she regularly takes Valium and oxycodone and Tylenol for her pain but this morning she woke up around 4 AM with excruciating pain from her lower back into her lateral thighs.  Reports that the pain is radiating all the way down into her lateral feet.  Reports that it eased off slightly with the oxycodone but she is still having excruciating pain and reports that this is the worst pain that she has ever had.  Patient denies any new injury or trauma.  She denies any fever, saddle anesthesia, loss of control of bowel or bladder movements, numbness, tingling or weakness in her lower extremities.  Patient is tearful.  Reports she got her first Covid vaccine 3 days ago.        Past Medical History:  Diagnosis Date   Allergy    Anxiety    Arthritis    B12 deficiency    Chronic narcotic use    Depression    not currently    Dysrhythmia    hx tachy-was from medication nortriptylline   Esophagitis    rx   Fibromyalgia    GERD (gastroesophageal reflux disease)    History of echocardiogram    Echo 4/18: Mild concentric LVH, vigorous LVF, EF 65-70, normal wall motion, grade 1 diastolic dysfunction   History of kidney stones    Hyperlipidemia    Intervertebral disc protrusion 01/11/2014   Migraine    Obesity (BMI 35.0-39.9 without comorbidity)    Peripheral neuropathy    Polycystic ovaries    Pulmonary embolus (HCC)    Spinal stenosis, lumbar    Vitamin D deficiency     Patient Active Problem List   Diagnosis Date Noted    Major depressive disorder with single episode, 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   Supraventricular tachycardia (HCC) 07/03/2019   Hypercoagulable state (HCC) 07/03/2019   Sepsis (HCC) 06/21/2018   Leukocytosis 06/21/2018   Hypertensive urgency 06/21/2018   SIRS (systemic inflammatory response syndrome) (HCC) 06/17/2018   Fever    Precordial pain    Elevated lactic acid level    Bandemia    Migraine 06/07/2018   Pulmonary embolism (HCC) 06/06/2018   DDD (degenerative disc disease), lumbar 05/04/2018   Atypical chest pain    Spinal stenosis of lumbar region 04/01/2015   Chronic migraine without aura without status migrainosus, not intractable 03/04/2015   Elevated plasma metanephrines 11/16/2014   Alopecia 05/21/2014   Tachycardia 04/04/2014   Sciatica 01/11/2014   Back pain 01/11/2014   Intervertebral disc protrusion 01/11/2014   HTN (hypertension) 01/04/2013   Hemorrhoids 04/25/2012   Urinary retention 12/11/2010   CERUMEN IMPACTION 01/28/2010   Depression 04/08/2009   Myalgia 10/11/2008   Morbid obesity (HCC) 08/10/2008   ACNE VULGARIS 08/10/2008   INSOMNIA 06/20/2008   B12 deficiency 03/23/2008   Hereditary and idiopathic peripheral neuropathy 02/27/2008   ANXIETY DISORDER, GENERALIZED 01/04/2007   GERD (gastroesophageal reflux disease) 01/04/2007   DEGENERATIVE JOINT DISEASE, KNEES, BILATERAL 01/04/2007   RENAL CALCULUS, HX OF 01/04/2007    Past Surgical History:  Procedure  Laterality Date   34 HOUR PH STUDY N/A 05/27/2016   Procedure: 24 HOUR PH STUDY;  Surgeon: Ruffin Frederick, MD;  Location: [facility];  Service: Gastroenterology;  Laterality: N/A;   ABDOMINAL EXPOSURE N/A 05/04/2018   Procedure: ABDOMINAL EXPOSURE;  Surgeon: Larina Earthly, MD;  Location: [facility] OR;  Service: Vascular;  Laterality: N/A;   ANTERIOR LUMBAR FUSION N/A 05/04/2018   Procedure: Lumbar Five Sacral One Anterior lumbar interbody fusion;   Surgeon: Donalee Citrin, MD;  Location: [facility];  Service: Neurosurgery;  Laterality: N/A;  Lumbar Five Sacral One Anterior lumbar interbody fusion   APPENDECTOMY     BACK SURGERY  2012   left laminectomy at L3-4 per Dr. Phoebe Perch    bladder tack  2012   CHOLECYSTECTOMY N/A 10/03/2015   Procedure: LAPAROSCOPIC CHOLECYSTECTOMY;  Surgeon: Rodman Pickle, MD;  Location: [facility] OR;  Service: General;  Laterality: N/A;   ESOPHAGEAL MANOMETRY N/A 05/27/2016   Procedure: ESOPHAGEAL MANOMETRY (EM);  Surgeon: Ruffin Frederick, MD;  Location: [facility];  Service: Gastroenterology;  Laterality: N/A;   EXCISION OF SKIN TAG  10/03/2015   Procedure: EXCISION OF SKIN TAG;  Surgeon: De Blanch Kinsinger, MD;  Location: [facility] OR;  Service: General;;   LUMBAR DISC SURGERY  01-31-14   per Dr. [name]    OOPHORECTOMY     left overy   OVARIAN CYST REMOVAL     RECTOCELE REPAIR     SINUSOTOMY  12/14/06   spinal fusion  2016/10   TONSILLECTOMY     TONSILLECTOMY     VAGINAL HYSTERECTOMY     WRIST GANGLION EXCISION Left      OB History    Gravida  2   Para  2   Term      Preterm  2   AB  0   Living  2     SAB      TAB      Ectopic      Multiple      Live Births  2           Family History  Problem Relation Age of Onset   Fibromyalgia Mother    Diabetes Mother    Thyroid cancer Mother    Liver disease Mother    Neuropathy Mother    Cirrhosis Mother        non-alcoholic   Pulmonary embolism Mother        both lungs   Heart failure Mother    Hypertension Father    Diabetes Father    Heart disease Father    Prostate cancer Father    Heart attack Father        x 2   Colon cancer Neg Hx    Other Neg Hx        pheochromocytoma   Colon polyps Neg Hx     Social History   Tobacco Use   Smoking status: Former Smoker    Types: Cigarettes    Quit date: 1999    Years since quitting: 22.2   Smokeless tobacco: Never Used  Substance Use Topics    Alcohol use: No    Alcohol/week: 0.0 standard drinks   Drug use: No    Home Medications Prior to Admission medications   Medication Sig Start Date End Date Taking? Authorizing Provider  calcium carbonate (TUMS - DOSED IN MG ELEMENTAL CALCIUM) 500 MG chewable tablet Chew 1 tablet by mouth 2 (two) times daily as needed for indigestion or  heartburn.    [provider]  cetirizine (ZYRTEC) 10 MG tablet Take 10 mg by mouth daily.    [provider]  cyanocobalamin (,VITAMIN B-12,) 1000 MCG/ML injection INJECT 1 ML (1,000 MCG TOTAL) INTO THE MUSCLE EVERY 7 DAYS. 07/21/19   [name], MD  cyclobenzaprine (FLEXERIL) 10 MG tablet Take 10 mg by mouth 3 (three) times daily as needed for muscle spasms.    [provider]  diazepam (VALIUM) 5 MG tablet Take 1 tablet (5 mg total) by mouth every 8 (eight) hours as needed for muscle spasms (muscle spasm/pain). 07/18/19   [name], MD  docusate sodium (COLACE) 100 MG capsule Take 100 mg by mouth at bedtime as needed.     [provider]  famotidine (PEPCID) 40 MG tablet TAKE 1 TABLET BY MOUTH EVERYDAY AT BEDTIME 03/14/19   [name], MD  fluconazole (DIFLUCAN) 150 MG tablet TAKE 1 TABLET BY MOUTH EVERY DAY 06/19/19   [name], MD  fluticasone (FLONASE) 50 MCG/ACT nasal spray Place 2 sprays into both nostrils every evening.     [provider]  gabapentin (NEURONTIN) 100 MG capsule Take 1 capsule (100 mg total) by mouth 3 (three) times daily. 12/07/18   [name], MD  gabapentin (NEURONTIN) 300 MG capsule TAKE 1 CAPSULE BY MOUTH THREE TIMES A DAY 08/07/19   [name], MD  hydrochlorothiazide (HYDRODIURIL) 25 MG tablet TAKE 1 TABLET BY MOUTH EVERY DAY 08/10/19   [name], MD  HYDROmorphone (DILAUDID) 8 MG tablet Take 1 tablet (8 mg total) by mouth every 6 (six) hours as needed for severe pain. 09/18/19 10/18/19  [name], MD  Multiple Vitamins-Minerals (MULTI-VITAMIN GUMMIES PO) Take 2  tablets by mouth daily.     [provider]  nitrofurantoin, macrocrystal-monohydrate, (MACROBID) 100 MG capsule Take 100 mg by mouth daily as needed (for uti prophylaxis, after intercourse).  09/26/16   [provider]  oxyCODONE (ROXICODONE) 15 MG immediate release tablet Take 1 tablet (15 mg total) by mouth every 4 (four) hours as needed for pain. 09/18/19 10/18/19  [name], MD  pantoprazole (PROTONIX) 40 MG tablet Take 1 tablet (40 mg total) by mouth every morning. Take 30 minutes before breakfast. 11/23/18   Meredith Pel, NP  potassium chloride (K-DUR) 10 MEQ tablet Take 10 mEq by mouth once a week.    [provider]  pramipexole (MIRAPEX) 1 MG tablet Take 1 or 2 tabs at bedtime 12/07/18   [name], MD  promethazine (PHENERGAN) 25 MG tablet TAKE 1 TABLET BY MOUTH EVERY 6 HOURS AS NEEDED FOR NAUSEA 02/19/17   [name], MD  simethicone (MYLICON) 80 MG chewable tablet Chew 160 mg by mouth every 6 (six) hours as needed (gas pain).    [provider]  sucralfate (CARAFATE) 1 g tablet Take 1 tablet (1 g total) by mouth 4 (four) times daily. 06/08/19   [name], MD  SUMAtriptan (IMITREX) 100 MG tablet Take 1 tablet earliest onset of migraine.  May repeat once in 2 hours if headache persists or recurs. 10/14/16   Jaffe, Adam R, DO  SYRINGE-NEEDLE, DISP, 3 ML (B-D 3CC LUER-LOK SYR 25GX1") 25G X 1" 3 ML MISC USE AS DIRECTED 05/15/19   [name], MD  Vitamin D, Ergocalciferol, (DRISDOL) 1.25 MG (50000 UT) CAPS capsule TAKE 1 CAPSULE (50,000 UNITS TOTAL) BY MOUTH EVERY 7 (SEVEN) DAYS. 07/05/18   Clent Ridges,  Ishmael Holter, MD    Allergies    Gadolinium derivatives, Other, and Zoloft [sertraline hcl]  Review of Systems   Review of Systems  Constitutional: Negative for chills and fever.  HENT: Negative for ear pain and sore throat.   Eyes: Negative for pain and visual disturbance.  Respiratory: Negative for cough and shortness of breath.   Cardiovascular:  Negative for chest pain.  Gastrointestinal: Negative for abdominal pain, constipation, diarrhea, nausea and vomiting.  Genitourinary: Negative for difficulty urinating, dysuria and hematuria.  Musculoskeletal: Positive for arthralgias, back pain and myalgias. Negative for joint swelling, neck pain and neck stiffness.  Skin: Negative for color change and rash.  Neurological: Negative for seizures, syncope and headaches.  All other systems reviewed and are negative.   Physical Exam Updated Vital Signs BP 124/72    Pulse 96    Temp 99.3 F (37.4 C) (Oral)    Resp 16    Ht 5' 9.5" (1.765 m)    Wt 122.5 kg    LMP 12/13/1999 Comment: single oophorectomy//a.c.   SpO2 96%    BMI 39.30 kg/m   Physical Exam Vitals and nursing note reviewed.  Constitutional:      General: She is not in acute distress.    Appearance: Normal appearance. She is obese. She is not ill-appearing, toxic-appearing or diaphoretic.  HENT:     Head: Normocephalic.     Mouth/Throat:     Mouth: Mucous membranes are moist.  Eyes:     Conjunctiva/sclera: Conjunctivae normal.  Cardiovascular:     Rate and Rhythm: Tachycardia present.  Pulmonary:     Effort: Pulmonary effort is normal.  Abdominal:     General: Abdomen is flat.     Palpations: Abdomen is soft.  Musculoskeletal:     Comments: TTP from mid T spine midline down to coccyx. Well healed lower lumbar surgical scar. No rashes, lesions or other skin changes  Skin:    General: Skin is dry.  Neurological:     General: No focal deficit present.     Mental Status: She is alert.     Sensory: Sensation is intact. No sensory deficit.     Motor: No weakness or atrophy.     Deep Tendon Reflexes: Reflexes normal.     Comments: Normal babinski, normal reflexes and strength of LE and pulses. TTP over lateral thighs  Psychiatric:     Comments: Anxious, tearful     ED Results / Procedures / Treatments   Labs (all labs ordered are listed, but only abnormal results are  displayed) Labs Reviewed  PREGNANCY, URINE    EKG None  Radiology DG Thoracic Spine 2 View  Result Date: 08/21/2019 CLINICAL DATA:  Back pain radiating to bilateral lower extremities, no history of injury, recent COVID-19 vaccination EXAM: THORACIC SPINE 2 VIEWS; LUMBAR SPINE - COMPLETE 4+ VIEW COMPARISON:  08/07/2019 FINDINGS: Thoracic spine: Frontal and lateral views of the thoracic spine demonstrates anatomic alignment. There are no fractures. Disc spaces are well preserved. Paraspinal soft tissues are normal. Lumbar spine: Frontal, bilateral oblique, lateral views of the lumbar spine are obtained. Continued postsurgical changes from laminectomy, posterior fusion, and discectomy spanning L3 through S1. No change in positioning of the orthopedic hardware. No evidence of metallic hardware failure or loosening. No fractures. Nonsurgical disc spaces are well preserved. Paraspinal soft tissues are normal. IMPRESSION: 1. No acute fracture within the thoracic and lumbar spine. 2. Stable postsurgical changes from L3 through S1. Electronically Signed   By: Legrand Como  Manson Passey M.D.   On: 08/21/2019 16:15   DG Lumbar Spine Complete  Result Date: 08/21/2019 CLINICAL DATA:  Back pain radiating to bilateral lower extremities, no history of injury, recent COVID-19 vaccination EXAM: THORACIC SPINE 2 VIEWS; LUMBAR SPINE - COMPLETE 4+ VIEW COMPARISON:  08/07/2019 FINDINGS: Thoracic spine: Frontal and lateral views of the thoracic spine demonstrates anatomic alignment. There are no fractures. Disc spaces are well preserved. Paraspinal soft tissues are normal. Lumbar spine: Frontal, bilateral oblique, lateral views of the lumbar spine are obtained. Continued postsurgical changes from laminectomy, posterior fusion, and discectomy spanning L3 through S1. No change in positioning of the orthopedic hardware. No evidence of metallic hardware failure or loosening. No fractures. Nonsurgical disc spaces are well preserved.  Paraspinal soft tissues are normal. IMPRESSION: 1. No acute fracture within the thoracic and lumbar spine. 2. Stable postsurgical changes from L3 through S1. Electronically Signed   By: Sharlet Salina M.D.   On: 08/21/2019 16:15    Procedures Procedures (including critical care time)  Medications Ordered in ED Medications  predniSONE (DELTASONE) tablet 60 mg (has no administration in time range)  HYDROmorphone (DILAUDID) injection 1 mg (1 mg Intravenous Given 08/21/19 1508)  methocarbamol (ROBAXIN) injection 500 mg (500 mg Intravenous Given 08/21/19 1508)  HYDROmorphone (DILAUDID) injection 1 mg (1 mg Intravenous Given 08/21/19 1615)  LORazepam (ATIVAN) injection 1 mg (1 mg Intravenous Given 08/21/19 1645)    ED Course  I have reviewed the triage vital signs and the nursing notes.  Pertinent labs & imaging results that were available during my care of the patient were reviewed by me and considered in my medical decision making (see chart for details).  Clinical Course as of Aug 21 1714  Mon Aug 21, 2019  1715 Patient presenting with severe back pain and leg pain.  Patient very anxious on arrival.  Reassuring physical exam including no lower extremity weakness, no saddle anesthesia, loss of control of bowel or bladder movements or fever.  Patient was given IV pain medications as well as Ativan and is now comfortably relaxed in the stretcher.  Imaging was reassuring.  Advised her to follow-up with her psychiatrist, surgeon and primary care doctor.  We will give her a prescription for prednisone as this may be radicular pain.  Otherwise she was counseled on the dangers of the medications that she is already prescribed which includes Dilaudid, oxycodone, Valium and gabapentin.   [KM]    Clinical Course User Index [KM] Jeral Pinch   MDM Rules/Calculators/A&P                     Based on review of vitals, medical screening exam, lab work and/or imaging, there does not appear to be an  acute, emergent etiology for the patient's symptoms. Counseled pt on good return precautions and encouraged both PCP and ED follow-up as needed.  Prior to discharge, I also discussed incidental imaging findings with patient in detail and advised appropriate, recommended follow-up in detail.  Clinical Impression: 1. Musculoskeletal pain     Disposition: Discharge  Prior to providing a prescription for a controlled substance, I independently reviewed the patient's recent prescription history on the [place] Controlled Substance Reporting System. The patient had no recent or regular prescriptions and was deemed appropriate for a brief, less than 3 day prescription of narcotic for acute analgesia.  This note was prepared with assistance of Conservation officer, historic buildings. Occasional wrong-word or sound-a-like substitutions may have occurred due to  the inherent limitations of voice recognition software.  Final Clinical Impression(s) / ED Diagnoses Final diagnoses:  Musculoskeletal pain    Rx / DC Orders ED Discharge Orders    None       Jeral PinchMcLean, Kerryn Tennant A, PA-C 08/21/19 1716    Milagros Lollykstra, Richard S, MD 08/24/19 [phone]

## 2019-08-23 ENCOUNTER — Ambulatory Visit (INDEPENDENT_AMBULATORY_CARE_PROVIDER_SITE_OTHER): Payer: BC Managed Care – PPO | Admitting: Licensed Clinical Social Worker

## 2019-08-23 DIAGNOSIS — F331 Major depressive disorder, recurrent, moderate: Secondary | ICD-10-CM | POA: Diagnosis not present

## 2019-08-24 DIAGNOSIS — Z20828 Contact with and (suspected) exposure to other viral communicable diseases: Secondary | ICD-10-CM | POA: Diagnosis not present

## 2019-08-25 ENCOUNTER — Other Ambulatory Visit: Payer: Self-pay

## 2019-08-25 ENCOUNTER — Encounter: Payer: Self-pay | Admitting: Family Medicine

## 2019-08-25 ENCOUNTER — Telehealth (INDEPENDENT_AMBULATORY_CARE_PROVIDER_SITE_OTHER): Payer: BC Managed Care – PPO | Admitting: Family Medicine

## 2019-08-25 DIAGNOSIS — R6889 Other general symptoms and signs: Secondary | ICD-10-CM | POA: Diagnosis not present

## 2019-08-25 MED ORDER — DOXYCYCLINE HYCLATE 100 MG PO CAPS: 100.0000 mg | ORAL_CAPSULE | Freq: Two times a day (BID) | ORAL | 0 refills | Status: DC

## 2019-08-25 MED ORDER — ALBUTEROL SULFATE HFA 108 (90 BASE) MCG/ACT IN AERS: 2.0000 | INHALATION_SPRAY | RESPIRATORY_TRACT | 2 refills | Status: DC | PRN

## 2019-08-25 NOTE — Progress Notes (Signed)
Virtual Visit via Video Note  I connected with the patient on 08/25/19 at  8:15 AM EDT by a video enabled telemedicine application and verified that I am speaking with the correct person using two identifiers.  Location patient: home Location provider:work or home office Persons participating in the virtual visit: patient, provider  I discussed the limitations of evaluation and management by telemedicine and the availability of in person appointments. The patient expressed understanding and agreed to proceed.   HPI: Here for worsening symptoms and to follow up an ER visit on 08-21-19. About one week ago she developed some mild chest tightness but didn't think much of it. Then a few days l;ater she developed severe pains in the lower back which radiated down both legs to the feet. She has chronic low back pain but this usually radiates only to the left leg, and these pains were more severe than anything she had ever experienced. At the ER Xrays were negative. She was given IV pain medication and she was sent home on 5 days of Prednisone. She did not take the Prednisone, and instead has been taking her usual pain medications. Since that day her chest tightness has worsened and she now has chest pain. She has a dry cough and she is wheezing, though she denies any SOB. She has had fevers up to 102.7 degrees, and she has a bad headache. No NVD or loss of taste or smell. She had a Covid-19 test yesterday at [facility], but the result is still pending. Her husband and children have no symptoms, but her husband has already had a Covid infection about 6 weeks ago.    ROS: See pertinent positives and negatives per HPI.  Past Medical History:  Diagnosis Date  . Allergy   . Anxiety   . Arthritis   . B12 deficiency   . Chronic narcotic use   . Depression    not currently   . Dysrhythmia    hx tachy-was from medication nortriptylline  . Esophagitis    rx  . Fibromyalgia   . GERD (gastroesophageal reflux  disease)   . History of echocardiogram    Echo 4/18: Mild concentric LVH, vigorous LVF, EF 65-70, normal wall motion, grade 1 diastolic dysfunction  . History of kidney stones   . Hyperlipidemia   . Intervertebral disc protrusion 01/11/2014  . Migraine   . Obesity (BMI 35.0-39.9 without comorbidity)   . Peripheral neuropathy   . Polycystic ovaries   . Pulmonary embolus (HCC)   . Spinal stenosis, lumbar   . Vitamin D deficiency     Past Surgical History:  Procedure Laterality Date  . 24 HOUR PH STUDY N/A 05/27/2016   Procedure: 24 HOUR PH STUDY;  Surgeon: Ruffin Frederick, MD;  Location: [facility];  Service: Gastroenterology;  Laterality: N/A;  . ABDOMINAL EXPOSURE N/A 05/04/2018   Procedure: ABDOMINAL EXPOSURE;  Surgeon: Larina Earthly, MD;  Location: [facility] OR;  Service: Vascular;  Laterality: N/A;  . ANTERIOR LUMBAR FUSION N/A 05/04/2018   Procedure: Lumbar Five Sacral One Anterior lumbar interbody fusion;  Surgeon: Donalee Citrin, MD;  Location: [facility] OR;  Service: Neurosurgery;  Laterality: N/A;  Lumbar Five Sacral One Anterior lumbar interbody fusion  . APPENDECTOMY    . BACK SURGERY  2012   left laminectomy at L3-4 per Dr. Phoebe Perch   . bladder tack  2012  . CHOLECYSTECTOMY N/A 10/03/2015   Procedure: LAPAROSCOPIC CHOLECYSTECTOMY;  Surgeon: Rodman Pickle, MD;  Location: [facility] OR;  Service: General;  Laterality: N/A;  . ESOPHAGEAL MANOMETRY N/A 05/27/2016   Procedure: ESOPHAGEAL MANOMETRY (EM);  Surgeon: Ruffin Frederick, MD;  Location: [facility];  Service: Gastroenterology;  Laterality: N/A;  . EXCISION OF SKIN TAG  10/03/2015   Procedure: EXCISION OF SKIN TAG;  Surgeon: De Blanch Kinsinger, MD;  Location: [facility] OR;  Service: General;;  . LUMBAR DISC SURGERY  01-31-14   per Dr. [name]   . OOPHORECTOMY     left overy  . OVARIAN CYST REMOVAL    . RECTOCELE REPAIR    . SINUSOTOMY  12/14/06  . spinal fusion  2016/10  . TONSILLECTOMY    . TONSILLECTOMY    . VAGINAL HYSTERECTOMY     . WRIST GANGLION EXCISION Left     Family History  Problem Relation Age of Onset  . Fibromyalgia Mother   . Diabetes Mother   . Thyroid cancer Mother   . Liver disease Mother   . Neuropathy Mother   . Cirrhosis Mother        non-alcoholic  . Pulmonary embolism Mother        both lungs  . Heart failure Mother   . Hypertension Father   . Diabetes Father   . Heart disease Father   . Prostate cancer Father   . Heart attack Father        x 2  . Colon cancer Neg Hx   . Other Neg Hx        pheochromocytoma  . Colon polyps Neg Hx      Current Outpatient Medications:  .  albuterol (VENTOLIN HFA) 108 (90 Base) MCG/ACT inhaler, Inhale 2 puffs into the lungs every 4 (four) hours as needed for wheezing or shortness of breath., Disp: 18 g, Rfl: 2 .  calcium carbonate (TUMS - DOSED IN MG ELEMENTAL CALCIUM) 500 MG chewable tablet, Chew 1 tablet by mouth 2 (two) times daily as needed for indigestion or heartburn., Disp: , Rfl:  .  cetirizine (ZYRTEC) 10 MG tablet, Take 10 mg by mouth daily., Disp: , Rfl:  .  cyanocobalamin (,VITAMIN B-12,) 1000 MCG/ML injection, INJECT 1 ML (1,000 MCG TOTAL) INTO THE MUSCLE EVERY 7 DAYS., Disp: 4 mL, Rfl: 8 .  cyclobenzaprine (FLEXERIL) 10 MG tablet, Take 10 mg by mouth 3 (three) times daily as needed for muscle spasms., Disp: , Rfl:  .  diazepam (VALIUM) 5 MG tablet, Take 1 tablet (5 mg total) by mouth every 8 (eight) hours as needed for muscle spasms (muscle spasm/pain)., Disp: 90 tablet, Rfl: 5 .  docusate sodium (COLACE) 100 MG capsule, Take 100 mg by mouth at bedtime as needed. , Disp: , Rfl:  .  doxycycline (VIBRAMYCIN) 100 MG capsule, Take 1 capsule (100 mg total) by mouth 2 (two) times daily for 10 days., Disp: 20 capsule, Rfl: 0 .  famotidine (PEPCID) 40 MG tablet, TAKE 1 TABLET BY MOUTH EVERYDAY AT BEDTIME, Disp: 30 tablet, Rfl: 5 .  fluconazole (DIFLUCAN) 150 MG tablet, TAKE 1 TABLET BY MOUTH EVERY DAY, Disp: 5 tablet, Rfl: 5 .  fluticasone  (FLONASE) 50 MCG/ACT nasal spray, Place 2 sprays into both nostrils every evening. , Disp: , Rfl:  .  gabapentin (NEURONTIN) 100 MG capsule, Take 1 capsule (100 mg total) by mouth 3 (three) times daily., Disp: 90 capsule, Rfl: 5 .  gabapentin (NEURONTIN) 300 MG capsule, TAKE 1 CAPSULE BY MOUTH THREE TIMES A DAY, Disp: 270 capsule, Rfl: 2 .  hydrochlorothiazide (HYDRODIURIL) 25 MG tablet, TAKE  1 TABLET BY MOUTH EVERY DAY, Disp: 30 tablet, Rfl: 5 .  [START ON 09/18/2019] HYDROmorphone (DILAUDID) 8 MG tablet, Take 1 tablet (8 mg total) by mouth every 6 (six) hours as needed for severe pain., Disp: 30 tablet, Rfl: 0 .  Multiple Vitamins-Minerals (MULTI-VITAMIN GUMMIES PO), Take 2 tablets by mouth daily. , Disp: , Rfl:  .  nitrofurantoin, macrocrystal-monohydrate, (MACROBID) 100 MG capsule, Take 100 mg by mouth daily as needed (for uti prophylaxis, after intercourse). , Disp: , Rfl: 2 .  [START ON 09/18/2019] oxyCODONE (ROXICODONE) 15 MG immediate release tablet, Take 1 tablet (15 mg total) by mouth every 4 (four) hours as needed for pain., Disp: 180 tablet, Rfl: 0 .  pantoprazole (PROTONIX) 40 MG tablet, Take 1 tablet (40 mg total) by mouth every morning. Take 30 minutes before breakfast., Disp: 90 tablet, Rfl: 3 .  potassium chloride (K-DUR) 10 MEQ tablet, Take 10 mEq by mouth once a week., Disp: , Rfl:  .  pramipexole (MIRAPEX) 1 MG tablet, Take 1 or 2 tabs at bedtime, Disp: 60 tablet, Rfl: 2 .  predniSONE (DELTASONE) 10 MG tablet, Take 4 tablets (40 mg total) by mouth daily for 5 days., Disp: 20 tablet, Rfl: 0 .  promethazine (PHENERGAN) 25 MG tablet, TAKE 1 TABLET BY MOUTH EVERY 6 HOURS AS NEEDED FOR NAUSEA, Disp: 90 tablet, Rfl: 1 .  simethicone (MYLICON) 80 MG chewable tablet, Chew 160 mg by mouth every 6 (six) hours as needed (gas pain)., Disp: , Rfl:  .  sucralfate (CARAFATE) 1 g tablet, Take 1 tablet (1 g total) by mouth 4 (four) times daily., Disp: 120 tablet, Rfl: 2 .  SUMAtriptan (IMITREX) 100  MG tablet, Take 1 tablet earliest onset of migraine.  May repeat once in 2 hours if headache persists or recurs., Disp: 10 tablet, Rfl: 2 .  SYRINGE-NEEDLE, DISP, 3 ML (B-D 3CC LUER-LOK SYR 25GX1") 25G X 1" 3 ML MISC, USE AS DIRECTED, Disp: 9 each, Rfl: 11 .  Vitamin D, Ergocalciferol, (DRISDOL) 1.25 MG (50000 UT) CAPS capsule, TAKE 1 CAPSULE (50,000 UNITS TOTAL) BY MOUTH EVERY 7 (SEVEN) DAYS., Disp: 12 capsule, Rfl: 3  EXAM:  VITALS per patient if applicable:  GENERAL: alert, oriented, appears well and in no acute distress  HEENT: atraumatic, conjunttiva clear, no obvious abnormalities on inspection of external nose and ears  NECK: normal movements of the head and neck  LUNGS: on inspection no signs of respiratory distress, breathing rate appears normal, no obvious gross SOB, gasping or wheezing  CV: no obvious cyanosis  MS: moves all visible extremities without noticeable abnormality  PSYCH/NEURO: pleasant and cooperative, no obvious depression or anxiety, speech and thought processing grossly intact  ASSESSMENT AND PLAN: She almost certainly has a [id number] infection, though we await the official test result. She will drink plenty of fluids and take Tylenol for the fever. I advised her to go ahead and take the 5 days of Prednisone she got at the ER. We will start her on Doxycycline to cover possible bronchitis or pneumonia, and she will have an albuterl inhaler to use prn. Recheck as needed.  Alysia Penna, MD  Discussed the following assessment and plan:  No diagnosis found.     I discussed the assessment and treatment plan with the patient. The patient was provided an opportunity to ask questions and all were answered. The patient agreed with the plan and demonstrated an understanding of the instructions.   The patient was advised to call back or  seek an in-person evaluation if the symptoms worsen or if the condition fails to improve as anticipated.

## 2019-08-28 ENCOUNTER — Inpatient Hospital Stay (HOSPITAL_COMMUNITY): Payer: BC Managed Care – PPO

## 2019-08-28 ENCOUNTER — Inpatient Hospital Stay (HOSPITAL_COMMUNITY)
Admission: EM | Admit: 2019-08-28 | Discharge: 2019-09-05 | DRG: 177 | Disposition: A | Discharge status: Home Health | Payer: BC Managed Care – PPO | Attending: Internal Medicine | Admitting: Internal Medicine

## 2019-08-28 ENCOUNTER — Telehealth: Payer: Self-pay | Admitting: Emergency Medicine

## 2019-08-28 ENCOUNTER — Emergency Department (HOSPITAL_COMMUNITY): Payer: BC Managed Care – PPO

## 2019-08-28 ENCOUNTER — Telehealth: Payer: Self-pay | Admitting: Family Medicine

## 2019-08-28 ENCOUNTER — Encounter (HOSPITAL_COMMUNITY): Payer: Self-pay | Admitting: Emergency Medicine

## 2019-08-28 ENCOUNTER — Other Ambulatory Visit: Payer: Self-pay

## 2019-08-28 DIAGNOSIS — Z87891 Personal history of nicotine dependence: Secondary | ICD-10-CM | POA: Diagnosis not present

## 2019-08-28 DIAGNOSIS — I10 Essential (primary) hypertension: Secondary | ICD-10-CM | POA: Diagnosis not present

## 2019-08-28 DIAGNOSIS — F411 Generalized anxiety disorder: Secondary | ICD-10-CM | POA: Diagnosis present

## 2019-08-28 DIAGNOSIS — Z6838 Body mass index (BMI) 38.0-38.9, adult: Secondary | ICD-10-CM

## 2019-08-28 DIAGNOSIS — E860 Dehydration: Secondary | ICD-10-CM | POA: Diagnosis present

## 2019-08-28 DIAGNOSIS — U071 COVID-19: Principal | ICD-10-CM | POA: Diagnosis present

## 2019-08-28 DIAGNOSIS — J9690 Respiratory failure, unspecified, unspecified whether with hypoxia or hypercapnia: Secondary | ICD-10-CM | POA: Diagnosis not present

## 2019-08-28 DIAGNOSIS — Z79891 Long term (current) use of opiate analgesic: Secondary | ICD-10-CM | POA: Diagnosis not present

## 2019-08-28 DIAGNOSIS — K2100 Gastro-esophageal reflux disease with esophagitis, without bleeding: Secondary | ICD-10-CM | POA: Diagnosis present

## 2019-08-28 DIAGNOSIS — E785 Hyperlipidemia, unspecified: Secondary | ICD-10-CM | POA: Diagnosis not present

## 2019-08-28 DIAGNOSIS — F324 Major depressive disorder, single episode, in partial remission: Secondary | ICD-10-CM | POA: Diagnosis present

## 2019-08-28 DIAGNOSIS — R0602 Shortness of breath: Secondary | ICD-10-CM

## 2019-08-28 DIAGNOSIS — M797 Fibromyalgia: Secondary | ICD-10-CM | POA: Diagnosis present

## 2019-08-28 DIAGNOSIS — F329 Major depressive disorder, single episode, unspecified: Secondary | ICD-10-CM | POA: Diagnosis not present

## 2019-08-28 DIAGNOSIS — G894 Chronic pain syndrome: Secondary | ICD-10-CM | POA: Diagnosis present

## 2019-08-28 DIAGNOSIS — M48061 Spinal stenosis, lumbar region without neurogenic claudication: Secondary | ICD-10-CM | POA: Diagnosis not present

## 2019-08-28 DIAGNOSIS — M5136 Other intervertebral disc degeneration, lumbar region: Secondary | ICD-10-CM | POA: Diagnosis present

## 2019-08-28 DIAGNOSIS — J9601 Acute respiratory failure with hypoxia: Secondary | ICD-10-CM | POA: Diagnosis not present

## 2019-08-28 DIAGNOSIS — J069 Acute upper respiratory infection, unspecified: Secondary | ICD-10-CM

## 2019-08-28 DIAGNOSIS — R52 Pain, unspecified: Secondary | ICD-10-CM | POA: Diagnosis not present

## 2019-08-28 DIAGNOSIS — M51369 Other intervertebral disc degeneration, lumbar region without mention of lumbar back pain or lower extremity pain: Secondary | ICD-10-CM | POA: Diagnosis present

## 2019-08-28 DIAGNOSIS — E669 Obesity, unspecified: Secondary | ICD-10-CM | POA: Diagnosis present

## 2019-08-28 DIAGNOSIS — G629 Polyneuropathy, unspecified: Secondary | ICD-10-CM | POA: Diagnosis present

## 2019-08-28 DIAGNOSIS — G609 Hereditary and idiopathic neuropathy, unspecified: Secondary | ICD-10-CM | POA: Diagnosis present

## 2019-08-28 DIAGNOSIS — J1282 Pneumonia due to coronavirus disease 2019: Secondary | ICD-10-CM | POA: Diagnosis not present

## 2019-08-28 DIAGNOSIS — K219 Gastro-esophageal reflux disease without esophagitis: Secondary | ICD-10-CM | POA: Diagnosis not present

## 2019-08-28 DIAGNOSIS — R0902 Hypoxemia: Secondary | ICD-10-CM | POA: Diagnosis not present

## 2019-08-28 DIAGNOSIS — G8929 Other chronic pain: Secondary | ICD-10-CM | POA: Diagnosis present

## 2019-08-28 DIAGNOSIS — F32A Depression, unspecified: Secondary | ICD-10-CM | POA: Diagnosis present

## 2019-08-28 LAB — CBC WITH DIFFERENTIAL/PLATELET
Abs Immature Granulocytes: 0.01 10*3/uL (ref 0.00–0.07)
Basophils Absolute: 0 10*3/uL (ref 0.0–0.1)
Basophils Relative: 0 %
Eosinophils Absolute: 0 10*3/uL (ref 0.0–0.5)
Eosinophils Relative: 0 %
HCT: 45.5 % (ref 36.0–46.0)
Hemoglobin: 15.3 g/dL — ABNORMAL HIGH (ref 12.0–15.0)
Immature Granulocytes: 0 %
Lymphocytes Relative: 17 %
Lymphs Abs: 1 10*3/uL (ref 0.7–4.0)
MCH: 29.3 pg (ref 26.0–34.0)
MCHC: 33.6 g/dL (ref 30.0–36.0)
MCV: 87 fL (ref 80.0–100.0)
Monocytes Absolute: 0.3 10*3/uL (ref 0.1–1.0)
Monocytes Relative: 4 %
Neutro Abs: 4.8 10*3/uL (ref 1.7–7.7)
Neutrophils Relative %: 79 %
Platelets: 193 10*3/uL (ref 150–400)
RBC: 5.23 MIL/uL — ABNORMAL HIGH (ref 3.87–5.11)
RDW: 13.9 % (ref 11.5–15.5)
WBC: 6.1 10*3/uL (ref 4.0–10.5)
nRBC: 0 % (ref 0.0–0.2)

## 2019-08-28 LAB — LACTIC ACID, PLASMA
Lactic Acid, Venous: 2 mmol/L (ref 0.5–1.9)
Lactic Acid, Venous: 1.5 mmol/L (ref 0.5–1.9)

## 2019-08-28 LAB — FERRITIN: Ferritin: 1352 ng/mL — ABNORMAL HIGH (ref 11–307)

## 2019-08-28 LAB — LACTATE DEHYDROGENASE: LDH: 360 U/L — ABNORMAL HIGH (ref 98–192)

## 2019-08-28 LAB — COMPREHENSIVE METABOLIC PANEL
ALT: 55 U/L — ABNORMAL HIGH (ref 0–44)
AST: 59 U/L — ABNORMAL HIGH (ref 15–41)
Albumin: 3.4 g/dL — ABNORMAL LOW (ref 3.5–5.0)
Alkaline Phosphatase: 56 U/L (ref 38–126)
Anion gap: 14 (ref 5–15)
BUN: 15 mg/dL (ref 6–20)
CO2: 26 mmol/L (ref 22–32)
Calcium: 8.2 mg/dL — ABNORMAL LOW (ref 8.9–10.3)
Chloride: 98 mmol/L (ref 98–111)
Creatinine, Ser: 0.86 mg/dL (ref 0.44–1.00)
GFR calc Af Amer: >60 mL/min (ref >60)
GFR calc non Af Amer: >60 mL/min (ref >60)
Glucose, Bld: 151 mg/dL — ABNORMAL HIGH (ref 70–99)
Potassium: 3.6 mmol/L (ref 3.5–5.1)
Sodium: 138 mmol/L (ref 135–145)
Total Bilirubin: 0.3 mg/dL (ref 0.3–1.2)
Total Protein: 7.2 g/dL (ref 6.5–8.1)

## 2019-08-28 LAB — RESPIRATORY PANEL BY RT PCR (FLU A&B, COVID)
Influenza A by PCR: NEGATIVE
Influenza B by PCR: NEGATIVE
SARS Coronavirus 2 by RT PCR: POSITIVE — AB

## 2019-08-28 LAB — POC SARS CORONAVIRUS 2 AG -  ED: SARS Coronavirus 2 Ag: NEGATIVE

## 2019-08-28 LAB — C-REACTIVE PROTEIN: CRP: 9.8 mg/dL — ABNORMAL HIGH (ref <1.0)

## 2019-08-28 LAB — FIBRINOGEN: Fibrinogen: 701 mg/dL — ABNORMAL HIGH (ref 210–475)

## 2019-08-28 LAB — TROPONIN I (HIGH SENSITIVITY)
Troponin I (High Sensitivity): 3 ng/L (ref <18)
Troponin I (High Sensitivity): 3 ng/L (ref <18)

## 2019-08-28 LAB — TRIGLYCERIDES: Triglycerides: 275 mg/dL — ABNORMAL HIGH (ref <150)

## 2019-08-28 LAB — GLUCOSE, CAPILLARY
Glucose-Capillary: 95 mg/dL (ref 70–99)
Glucose-Capillary: 136 mg/dL — ABNORMAL HIGH (ref 70–99)

## 2019-08-28 LAB — PROCALCITONIN: Procalcitonin: 0.52 ng/mL

## 2019-08-28 LAB — D-DIMER, QUANTITATIVE: D-Dimer, Quant: 0.62 ug/mL-FEU — ABNORMAL HIGH (ref 0.00–0.50)

## 2019-08-28 LAB — ABO/RH: ABO/RH(D): O POS

## 2019-08-28 MED ORDER — IOHEXOL 350 MG/ML SOLN
100.0000 mL | Freq: Once | INTRAVENOUS | Status: AC | PRN
  Administered 2019-08-28: 100 mL via INTRAVENOUS

## 2019-08-28 MED ORDER — SODIUM CHLORIDE 0.9 % IV SOLN
1000.0000 mL | INTRAVENOUS | Status: DC
  Administered 2019-08-28 (×2): 1000 mL via INTRAVENOUS

## 2019-08-28 MED ORDER — POLYETHYLENE GLYCOL 3350 17 G PO PACK
17.0000 g | PACK | Freq: Every day | ORAL | Status: DC | PRN
  Filled 2019-08-28: qty 1

## 2019-08-28 MED ORDER — SUCRALFATE 1 GM/10ML PO SUSP
1.0000 g | Freq: Three times a day (TID) | ORAL | Status: DC
  Administered 2019-08-28 – 2019-09-05 (×31): 1 g via ORAL
  Filled 2019-08-28 (×31): qty 10

## 2019-08-28 MED ORDER — ZINC SULFATE 220 (50 ZN) MG PO CAPS
220.0000 mg | ORAL_CAPSULE | Freq: Every day | ORAL | Status: DC
  Administered 2019-08-29 – 2019-09-05 (×8): 220 mg via ORAL
  Filled 2019-08-28 (×8): qty 1

## 2019-08-28 MED ORDER — ONDANSETRON HCL 4 MG/2ML IJ SOLN
4.0000 mg | Freq: Once | INTRAMUSCULAR | Status: AC
  Administered 2019-08-28: 4 mg via INTRAVENOUS
  Filled 2019-08-28: qty 2

## 2019-08-28 MED ORDER — SODIUM CHLORIDE 0.9 % IV SOLN
200.0000 mg | Freq: Once | INTRAVENOUS | Status: AC
  Administered 2019-08-28: 200 mg via INTRAVENOUS
  Filled 2019-08-28: qty 40

## 2019-08-28 MED ORDER — SODIUM CHLORIDE 0.9% FLUSH
3.0000 mL | Freq: Once | INTRAVENOUS | Status: AC
  Administered 2019-08-28: 3 mL via INTRAVENOUS

## 2019-08-28 MED ORDER — PROMETHAZINE HCL 25 MG PO TABS
12.5000 mg | ORAL_TABLET | Freq: Four times a day (QID) | ORAL | Status: DC | PRN
  Administered 2019-08-29: 12.5 mg via ORAL
  Filled 2019-08-28: qty 1

## 2019-08-28 MED ORDER — SODIUM CHLORIDE 0.9 % IV SOLN
100.0000 mg | Freq: Every day | INTRAVENOUS | Status: AC
  Administered 2019-08-29 – 2019-09-01 (×4): 100 mg via INTRAVENOUS
  Filled 2019-08-28 (×4): qty 20

## 2019-08-28 MED ORDER — MORPHINE SULFATE (PF) 4 MG/ML IV SOLN
4.0000 mg | Freq: Once | INTRAVENOUS | Status: AC
  Administered 2019-08-28: 4 mg via INTRAVENOUS
  Filled 2019-08-28: qty 1

## 2019-08-28 MED ORDER — LORAZEPAM 2 MG/ML IJ SOLN
1.0000 mg | Freq: Three times a day (TID) | INTRAMUSCULAR | Status: DC | PRN
  Administered 2019-08-28 – 2019-09-05 (×9): 1 mg via INTRAVENOUS
  Filled 2019-08-28 (×9): qty 1

## 2019-08-28 MED ORDER — SODIUM CHLORIDE 0.9% FLUSH: 3.0000 mL | Freq: Once | INTRAVENOUS | Status: DC

## 2019-08-28 MED ORDER — SODIUM CHLORIDE (PF) 0.9 % IJ SOLN
INTRAMUSCULAR | Status: AC
  Filled 2019-08-28: qty 50

## 2019-08-28 MED ORDER — DEXAMETHASONE SODIUM PHOSPHATE 10 MG/ML IJ SOLN
6.0000 mg | INTRAMUSCULAR | Status: DC
  Administered 2019-08-28 – 2019-09-05 (×9): 6 mg via INTRAVENOUS
  Filled 2019-08-28 (×9): qty 1

## 2019-08-28 MED ORDER — INSULIN ASPART 100 UNIT/ML ~~LOC~~ SOLN
0.0000 [IU] | Freq: Three times a day (TID) | SUBCUTANEOUS | Status: DC
  Administered 2019-08-30: 2 [IU] via SUBCUTANEOUS
  Administered 2019-08-30: 1 [IU] via SUBCUTANEOUS
  Administered 2019-08-30 – 2019-08-31 (×2): 2 [IU] via SUBCUTANEOUS
  Administered 2019-09-01 – 2019-09-02 (×2): 1 [IU] via SUBCUTANEOUS
  Administered 2019-09-02: 2 [IU] via SUBCUTANEOUS
  Administered 2019-09-03 (×2): 1 [IU] via SUBCUTANEOUS
  Administered 2019-09-03 – 2019-09-04 (×2): 2 [IU] via SUBCUTANEOUS
  Administered 2019-09-05: 1 [IU] via SUBCUTANEOUS

## 2019-08-28 MED ORDER — ENOXAPARIN SODIUM 60 MG/0.6ML ~~LOC~~ SOLN
60.0000 mg | SUBCUTANEOUS | Status: DC
  Administered 2019-08-28 – 2019-09-04 (×8): 60 mg via SUBCUTANEOUS
  Filled 2019-08-28 (×8): qty 0.6

## 2019-08-28 MED ORDER — TOCILIZUMAB 400 MG/20ML IV SOLN
800.0000 mg | Freq: Once | INTRAVENOUS | Status: AC
  Administered 2019-08-28: 800 mg via INTRAVENOUS
  Filled 2019-08-28: qty 40

## 2019-08-28 MED ORDER — PROMETHAZINE HCL 25 MG/ML IJ SOLN
12.5000 mg | Freq: Four times a day (QID) | INTRAMUSCULAR | Status: DC | PRN
  Administered 2019-08-28 – 2019-09-04 (×5): 12.5 mg via INTRAVENOUS
  Filled 2019-08-28 (×5): qty 1

## 2019-08-28 MED ORDER — ACETAMINOPHEN 325 MG PO TABS
650.0000 mg | ORAL_TABLET | Freq: Four times a day (QID) | ORAL | Status: DC | PRN
  Administered 2019-08-29 – 2019-08-30 (×2): 650 mg via ORAL
  Filled 2019-08-28 (×2): qty 2

## 2019-08-28 MED ORDER — ASCORBIC ACID 500 MG PO TABS
500.0000 mg | ORAL_TABLET | Freq: Every day | ORAL | Status: DC
  Administered 2019-08-29 – 2019-09-05 (×8): 500 mg via ORAL
  Filled 2019-08-28 (×8): qty 1

## 2019-08-28 MED ORDER — MORPHINE SULFATE (PF) 4 MG/ML IV SOLN
8.0000 mg | INTRAVENOUS | Status: DC | PRN
  Administered 2019-08-28 – 2019-09-03 (×27): 8 mg via INTRAVENOUS
  Filled 2019-08-28 (×28): qty 2

## 2019-08-28 NOTE — ED Provider Notes (Signed)
[facility]-EMERGENCY DEPT Provider Note   CSN: [id number] Arrival date & time: 08/28/19  1226     History No chief complaint on file.   [name] is a 44 y.o. female.  44 year old female with history of allergies presents with increased shortness of breath.  Diagnosed with Covid 4 days ago.  She has been sick for most 2 weeks.  Notes increasing dyspnea on exertion as well as fever treated at home with antipyretics.  Has had nausea vomiting and diarrhea.  Has had generalized malaise.  Called her doctor and told to come here        Past Medical History:  Diagnosis Date  . Allergy   . Anxiety   . Arthritis   . B12 deficiency   . Chronic narcotic use   . Depression    not currently   . Dysrhythmia    hx tachy-was from medication nortriptylline  . Esophagitis    rx  . Fibromyalgia   . GERD (gastroesophageal reflux disease)   . History of echocardiogram    Echo 4/18: Mild concentric LVH, vigorous LVF, EF 65-70, normal wall motion, grade 1 diastolic dysfunction  . History of kidney stones   . Hyperlipidemia   . Intervertebral disc protrusion 01/11/2014  . Migraine   . Obesity (BMI 35.0-39.9 without comorbidity)   . Peripheral neuropathy   . Polycystic ovaries   . Pulmonary embolus (HCC)   . Spinal stenosis, lumbar   . Vitamin D deficiency     Patient Active Problem List   Diagnosis Date Noted  . 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 Supraventricular tachycardia (HCC) 07/03/2019  . Hypercoagulable state (HCC) 07/03/2019  . Sepsis (HCC) 06/21/2018  . Leukocytosis 06/21/2018  . Hypertensive urgency 06/21/2018  . SIRS (systemic inflammatory response syndrome) (HCC) 06/17/2018  . Fever   . Precordial pain   . Elevated lactic acid level   . Bandemia   . Migraine 06/07/2018  . Pulmonary embolism (HCC) 06/06/2018  . DDD (degenerative disc disease), lumbar 05/04/2018  . Atypical chest pain     . Spinal stenosis of lumbar region 04/01/2015  . Chronic migraine without aura without status migrainosus, not intractable 03/04/2015  . Elevated plasma metanephrines 11/16/2014  . Alopecia 05/21/2014  . Tachycardia 04/04/2014  . Sciatica 01/11/2014  . Back pain 01/11/2014  . Intervertebral disc protrusion 01/11/2014  . HTN (hypertension) 01/04/2013  . Hemorrhoids 04/25/2012  . Urinary retention 12/11/2010  . CERUMEN IMPACTION 01/28/2010  . Depression 04/08/2009  . Myalgia 10/11/2008  . Morbid obesity (HCC) 08/10/2008  . ACNE VULGARIS 08/10/2008  . INSOMNIA 06/20/2008  . B12 deficiency 03/23/2008  . Hereditary and idiopathic peripheral neuropathy 02/27/2008  . ANXIETY DISORDER, GENERALIZED 01/04/2007  . GERD (gastroesophageal reflux disease) 01/04/2007  . DEGENERATIVE JOINT DISEASE, KNEES, BILATERAL 01/04/2007  . RENAL CALCULUS, HX OF 01/04/2007    Past Surgical History:  Procedure Laterality Date  . 24 HOUR PH STUDY N/A 05/27/2016   Procedure: 24 HOUR PH STUDY;  Surgeon: Ruffin FrederickSteven Paul Armbruster, MD;  Location: [facility];  Service: Gastroenterology;  Laterality: N/A;  . ABDOMINAL EXPOSURE N/A 05/04/2018   Procedure: ABDOMINAL EXPOSURE;  Surgeon: Larina EarthlyEarly, Todd F, MD;  [facility];  Service: Vascular;  Laterality: N/A;  . ANTERIOR LUMBAR FUSION N/A 05/04/2018   Procedure: Lumbar Five Sacral One Anterior lumbar interbody fusion;  Surgeon: Donalee Citrinram, Gary, MD;  [facility];  Service: Neurosurgery;  Laterality: N/A;  Lumbar Five  Sacral One Anterior lumbar interbody fusion  . APPENDECTOMY    . BACK SURGERY  2012   left laminectomy at L3-4 per Dr. Phoebe Perch   . bladder tack  2012  . CHOLECYSTECTOMY N/A 10/03/2015   Procedure: LAPAROSCOPIC CHOLECYSTECTOMY;  Surgeon: Rodman Pickle, MD;  Location: [facility] OR;  Service: General;  Laterality: N/A;  . ESOPHAGEAL MANOMETRY N/A 05/27/2016   Procedure: ESOPHAGEAL MANOMETRY (EM);  Surgeon: Ruffin Frederick, MD;  Location: [facility];   Service: Gastroenterology;  Laterality: N/A;  . EXCISION OF SKIN TAG  10/03/2015   Procedure: EXCISION OF SKIN TAG;  Surgeon: De Blanch Kinsinger, MD;  Location: [facility] OR;  Service: General;;  . LUMBAR DISC SURGERY  01-31-14   per Dr. [name]   . OOPHORECTOMY     left overy  . OVARIAN CYST REMOVAL    . RECTOCELE REPAIR    . SINUSOTOMY  12/14/06  . spinal fusion  2016/10  . TONSILLECTOMY    . TONSILLECTOMY    . VAGINAL HYSTERECTOMY    . WRIST GANGLION EXCISION Left      OB History    Gravida  2   Para  2   Term      Preterm  2   AB  0   Living  2     SAB      TAB      Ectopic      Multiple      Live Births  2           Family History  Problem Relation Age of Onset  . Fibromyalgia Mother   . Diabetes Mother   . Thyroid cancer Mother   . Liver disease Mother   . Neuropathy Mother   . Cirrhosis Mother        non-alcoholic  . Pulmonary embolism Mother        both lungs  . Heart failure Mother   . Hypertension Father   . Diabetes Father   . Heart disease Father   . Prostate cancer Father   . Heart attack Father        x 2  . Colon cancer Neg Hx   . Other Neg Hx        pheochromocytoma  . Colon polyps Neg Hx     Social History   Tobacco Use  . Smoking status: Former Smoker    Types: Cigarettes    Quit date: 1999    Years since quitting: 22.2  . Smokeless tobacco: Never Used  Substance Use Topics  . Alcohol use: No    Alcohol/week: 0.0 standard drinks  . Drug use: No    Home Medications Prior to Admission medications   Medication Sig Start Date End Date Taking? Authorizing Provider  albuterol (VENTOLIN HFA) 108 (90 Base) MCG/ACT inhaler Inhale 2 puffs into the lungs every 4 (four) hours as needed for wheezing or shortness of breath. 08/25/19   [name], MD  calcium carbonate (TUMS - DOSED IN MG ELEMENTAL CALCIUM) 500 MG chewable tablet Chew 1 tablet by mouth 2 (two) times daily as needed for indigestion or heartburn.    [provider]  cetirizine (ZYRTEC) 10 MG tablet Take 10 mg by mouth daily.    [provider]  cyanocobalamin (,VITAMIN B-12,) 1000 MCG/ML injection INJECT 1 ML (1,000 MCG TOTAL) INTO THE MUSCLE EVERY 7 DAYS. 07/21/19   [name], MD  cyclobenzaprine (FLEXERIL) 10 MG tablet Take 10 mg by mouth  3 (three) times daily as needed for muscle spasms.    [provider]  diazepam (VALIUM) 5 MG tablet Take 1 tablet (5 mg total) by mouth every 8 (eight) hours as needed for muscle spasms (muscle spasm/pain). 07/18/19   Laurey Morale, MD  docusate sodium (COLACE) 100 MG capsule Take 100 mg by mouth at bedtime as needed.     [provider]  doxycycline (VIBRAMYCIN) 100 MG capsule Take 1 capsule (100 mg total) by mouth 2 (two) times daily for 10 days. 08/25/19 09/04/19  Laurey Morale, MD  famotidine (PEPCID) 40 MG tablet TAKE 1 TABLET BY MOUTH EVERYDAY AT BEDTIME 03/14/19   Laurey Morale, MD  fluconazole (DIFLUCAN) 150 MG tablet TAKE 1 TABLET BY MOUTH EVERY DAY 06/19/19   Laurey Morale, MD  fluticasone [facility]) 50 MCG/ACT nasal spray Place 2 sprays into both nostrils every evening.     [provider]  gabapentin (NEURONTIN) 100 MG capsule Take 1 capsule (100 mg total) by mouth 3 (three) times daily. 12/07/18   Laurey Morale, MD  gabapentin (NEURONTIN) 300 MG capsule TAKE 1 CAPSULE BY MOUTH THREE TIMES A DAY 08/07/19   Laurey Morale, MD  hydrochlorothiazide (HYDRODIURIL) 25 MG tablet TAKE 1 TABLET BY MOUTH EVERY DAY 08/10/19   Laurey Morale, MD  HYDROmorphone (DILAUDID) 8 MG tablet Take 1 tablet (8 mg total) by mouth every 6 (six) hours as needed for severe pain. 09/18/19 10/18/19  Laurey Morale, MD  Multiple Vitamins-Minerals (MULTI-VITAMIN GUMMIES PO) Take 2 tablets by mouth daily.     [provider]  nitrofurantoin, macrocrystal-monohydrate, (MACROBID) 100 MG capsule Take 100 mg by mouth daily as needed (for uti prophylaxis, after intercourse).  09/26/16   [provider]  oxyCODONE (ROXICODONE) 15 MG immediate release tablet Take 1 tablet (15 mg total) by mouth every 4 (four) hours as needed for pain. 09/18/19 10/18/19  Laurey Morale, MD  pantoprazole (PROTONIX) 40 MG tablet Take 1 tablet (40 mg total) by mouth every morning. Take 30 minutes before breakfast. 11/23/18   Willia Craze, NP  potassium chloride (K-DUR) 10 MEQ tablet Take 10 mEq by mouth once a week.    [provider]  pramipexole (MIRAPEX) 1 MG tablet Take 1 or 2 tabs at bedtime 12/07/18   Laurey Morale, MD  promethazine (PHENERGAN) 25 MG tablet TAKE 1 TABLET BY MOUTH EVERY 6 HOURS AS NEEDED FOR NAUSEA 02/19/17   Laurey Morale, MD  simethicone (MYLICON) 80 MG chewable tablet Chew 160 mg by mouth every 6 (six) hours as needed (gas pain).    [provider]  sucralfate (CARAFATE) 1 g tablet Take 1 tablet (1 g total) by mouth 4 (four) times daily. 06/08/19   Laurey Morale, MD  SUMAtriptan (IMITREX) 100 MG tablet Take 1 tablet earliest onset of migraine.  May repeat once in 2 hours if headache persists or recurs. 10/14/16   Jaffe, Adam R, DO  SYRINGE-NEEDLE, DISP, 3 ML (B-D 3CC LUER-LOK SYR 25GX1") 25G X 1" 3 ML MISC USE AS DIRECTED 05/15/19   Laurey Morale, MD  Vitamin D, Ergocalciferol, (DRISDOL) 1.25 MG (50000 UT) CAPS capsule TAKE 1 CAPSULE (50,000 UNITS TOTAL) BY MOUTH EVERY 7 (SEVEN) DAYS. 07/05/18   Laurey Morale, MD    Allergies    Gadolinium derivatives, Other, and Zoloft [sertraline hcl]  Review of Systems   Review of Systems  All other systems reviewed and are negative.  Physical Exam Updated Vital Signs BP (!) 149/96   Pulse (!) 111   Temp 98 F (36.7 C) (Oral)   Resp (!) 32   Ht 1.765 m (5' 9.5")   Wt 123.8 kg   LMP 12/13/1999 Comment: single oophorectomy//a.c.  SpO2 95%   BMI 39.74 kg/m   Physical Exam Vitals and nursing note reviewed.  Constitutional:      General: She is not in acute distress.    Appearance: Normal appearance. She is  well-developed. She is not toxic-appearing.  HENT:     Head: Normocephalic and atraumatic.  Eyes:     General: Lids are normal.     Conjunctiva/sclera: Conjunctivae normal.     Pupils: Pupils are equal, round, and reactive to light.  Neck:     Thyroid: No thyroid mass.     Trachea: No tracheal deviation.  Cardiovascular:     Rate and Rhythm: Normal rate and regular rhythm.     Heart sounds: Normal heart sounds. No murmur. No gallop.   Pulmonary:     Effort: Pulmonary effort is normal. No respiratory distress.     Breath sounds: No stridor. Decreased breath sounds present. No wheezing, rhonchi or rales.  Abdominal:     General: Bowel sounds are normal. There is no distension.     Palpations: Abdomen is soft.     Tenderness: There is no abdominal tenderness. There is no rebound.  Musculoskeletal:        General: No tenderness. Normal range of motion.     Cervical back: Normal range of motion and neck supple.  Skin:    General: Skin is warm and dry.     Findings: No abrasion or rash.  Neurological:     Mental Status: She is alert and oriented to person, place, and time.     GCS: GCS eye subscore is 4. GCS verbal subscore is 5. GCS motor subscore is 6.     Cranial Nerves: No cranial nerve deficit.     Sensory: No sensory deficit.  Psychiatric:        Speech: Speech normal.        Behavior: Behavior normal.     ED Results / Procedures / Treatments   Labs (all labs ordered are listed, but only abnormal results are displayed) Labs Reviewed  CBC WITH DIFFERENTIAL/PLATELET - Abnormal; Notable for the following components:      Result Value   RBC 5.23 (*)    Hemoglobin 15.3 (*)    All other components within normal limits  CULTURE, BLOOD (ROUTINE X 2)  CULTURE, BLOOD (ROUTINE X 2)  LACTIC ACID, PLASMA  LACTIC ACID, PLASMA  COMPREHENSIVE METABOLIC PANEL  URINALYSIS, ROUTINE W REFLEX MICROSCOPIC  D-DIMER, QUANTITATIVE (NOT AT [facility])  PROCALCITONIN  LACTATE DEHYDROGENASE    FERRITIN  TRIGLYCERIDES  FIBRINOGEN  C-REACTIVE PROTEIN  I-STAT BETA HCG BLOOD, ED ([facility], [facility], [facility] ONLY)  POC SARS CORONAVIRUS 2 AG -  ED  TROPONIN I (HIGH SENSITIVITY)    EKG None  Radiology No results found.  Procedures Procedures (including critical care time)  Medications Ordered in ED Medications  sodium chloride flush (NS) 0.9 % injection 3 mL (has no administration in time range)  0.9 %  sodium chloride infusion (has no administration in time range)  ondansetron (ZOFRAN) injection 4 mg (has no administration in time range)  morphine 4 MG/ML injection 4 mg (has no administration in time range)  sodium chloride flush (NS) 0.9 % injection 3 mL (3 mLs  Intravenous Given 08/28/19 1303)    ED Course  I have reviewed the triage vital signs and the nursing notes.  Pertinent labs & imaging results that were available during my care of the patient were reviewed by me and considered in my medical decision making (see chart for details).  Patient placed on 2 L of oxygen and she feels better at this time.  X-ray consistent with Covid.  Patient's point-of-care Covid test negative and have sent PCR.  Her D-dimer elevated at 0.62.  Feel that her clinical situation is more consistent with Covid as opposed to pulmonary embolus.  Case discussed with Dr. Mellody Drown from [facility] hospitalist who will admit  CRITICAL CARE Performed by: Toy Baker Total critical care time: 50 minutes Critical care time was exclusive of separately billable procedures and treating other patients. Critical care was necessary to treat or prevent imminent or life-threatening deterioration. Critical care was time spent personally by me on the following activities: development of treatment plan with patient and/or surrogate as well as nursing, discussions with consultants, evaluation of patient's response to treatment, examination of patient, obtaining history from patient or surrogate, ordering and performing treatments  and interventions, ordering and review of laboratory studies, ordering and review of radiographic studies, pulse oximetry and re-evaluation of patient's condition.     MDM Rules/Calculators/A&P                     ED ECG REPORT   Date: 08/28/2019  Rate: 106  Rhythm: sinus tachycardia  QRS Axis: normal  Intervals: normal  ST/T Wave abnormalities: normal  Conduction Disutrbances:none  Narrative Interpretation:   Old EKG Reviewed: none available  I have personally reviewed the EKG tracing and agree with the computerized printout as noted.  Final Clinical Impression(s) / ED Diagnoses Final diagnoses:  SOB (shortness of breath)    Rx / DC Orders ED Discharge Orders    None       Lorre Nick, MD 08/28/19 1451

## 2019-08-28 NOTE — ED Notes (Signed)
Date and time results received: 08/28/19 1:50 PM  (use smartphrase ".now" to insert current time)  Test: Lactic Critical Value: 2.0  Name of Provider Notified: Freida Busman  Orders Received? Or Actions Taken?: Orders Received - See Orders for details

## 2019-08-28 NOTE — Telephone Encounter (Signed)
Pts husband was in the office and was wondering if the pt needs to go to the hospital due to the medication that she received from her virtual visit is not helping for her breathing.  Pt would like to have a call back today.

## 2019-08-28 NOTE — Progress Notes (Signed)
Pt is admitted for COVID. Her BMI is 39. Ok to increase her Lovenox to 0.5mg /kg/day per Dr. Rito Ehrlich.  Ulyses Southward, PharmD, BCIDP, AAHIVP, CPP Infectious Disease Pharmacist 08/28/2019 6:04 PM

## 2019-08-28 NOTE — ED Triage Notes (Signed)
Pt complaint of worsening SOB; diagnosed with COVID 4 days ago.  VS with EMS BP 119/79 HR 112 RR 40 O2 on RA 92%  Temp 96.8 temporal (tylenol 2 hours ago)  4 mg of zofran IM 324 aspirin PO

## 2019-08-28 NOTE — H&P (Signed)
History and Physical  [name] ZOX:[id number] DOB: 1976-05-19 DOA: 08/28/2019  Referring physician: Bruce Donath, ER physician PCP: [name], MD  Outpatient Specialists: None Patient coming from: Home & is able to ambulate without assistance, but prolonged distances require a walker because of her chronic back pain  Chief Complaint: Trouble breathing  HPI: [name] is a 44 y.o. female with medical history significant for obesity, chronic back pain from lumbar disc disease on chronic opiates and anxiety/depression whose husband tested positive for Covid last month.  Patient states that approximately 10 days ago, she started noticing some mild symptoms such as cough and mild fever.  She had an outpatient PCR test for Covid done a few days ago which came back positive.  Over the last few days, patient's breathing is gotten a lot worse and she finds herself gasping for breath.  She came to the emergency room today for further evaluation.  ED Course: In the emergency room, patient found to have oxygen saturations around 87% on room air requiring 2 L.  Lab work noteworthy for ferritin of 1300, CRP of 9.8 and initial lactic acid level of 2, although this is felt to be more from dehydration as patient reports nausea and esophagitis over the last week so she has been barely able to take any p.o.  Procalcitonin level mildly elevated at 0.52 but patient had normal white blood cell count.  Review of Systems: Patient seen in the emergency room. Pt complains of feeling very anxious and nervous.  Was feeling short of breath over with the oxygen, feeling better.  She feels quite nauseated and complains of a mild headache.  Does complain of cough, mostly nonproductive.  She is does complain also no appetite plus some trouble swallowing which she attributes to esophagitis.  She also complains of her chronic lower back pain  Pt denies any vision changes, chest pain, palpitations, wheezing,  abdominal pain, hematuria, dysuria, constipation, diarrhea, focal extremity numbness weakness or pain.  Review of systems are otherwise negative   Past Medical History:  Diagnosis Date  . Allergy   . Anxiety   . Arthritis   . B12 deficiency   . Chronic narcotic use   . Depression    not currently   . Dysrhythmia    hx tachy-was from medication nortriptylline  . Esophagitis    rx  . Fibromyalgia   . GERD (gastroesophageal reflux disease)   . History of echocardiogram    Echo 4/18: Mild concentric LVH, vigorous LVF, EF 65-70, normal wall motion, grade 1 diastolic dysfunction  . History of kidney stones   . Hyperlipidemia   . Intervertebral disc protrusion 01/11/2014  . Migraine   . Obesity (BMI 35.0-39.9 without comorbidity)   . Peripheral neuropathy   . Polycystic ovaries   . Pulmonary embolus (HCC)   . Spinal stenosis, lumbar   . Vitamin D deficiency    Past Surgical History:  Procedure Laterality Date  . 24 HOUR PH STUDY N/A 05/27/2016   Procedure: 24 HOUR PH STUDY;  Surgeon: Ruffin Frederick, MD;  Location: [facility] ENDOSCOPY;  Service: Gastroenterology;  Laterality: N/A;  . ABDOMINAL EXPOSURE N/A 05/04/2018   Procedure: ABDOMINAL EXPOSURE;  Surgeon: Larina Earthly, MD;  Location: [facility] OR;  Service: Vascular;  Laterality: N/A;  . ANTERIOR LUMBAR FUSION N/A 05/04/2018   Procedure: Lumbar Five Sacral One Anterior lumbar interbody fusion;  Surgeon: Donalee Citrin, MD;  Location: [facility] OR;  Service: Neurosurgery;  Laterality: N/A;  Lumbar Five Sacral One Anterior lumbar interbody fusion  . APPENDECTOMY    . BACK SURGERY  2012   left laminectomy at L3-4 per Dr. Phoebe Perch   . bladder tack  2012  . CHOLECYSTECTOMY N/A 10/03/2015   Procedure: LAPAROSCOPIC CHOLECYSTECTOMY;  Surgeon: Rodman Pickle, MD;  Location: [facility] OR;  Service: General;  Laterality: N/A;  . ESOPHAGEAL MANOMETRY N/A 05/27/2016   Procedure: ESOPHAGEAL MANOMETRY (EM);  Surgeon: Ruffin Frederick, MD;  Location: [facility]  ENDOSCOPY;  Service: Gastroenterology;  Laterality: N/A;  . EXCISION OF SKIN TAG  10/03/2015   Procedure: EXCISION OF SKIN TAG;  Surgeon: De Blanch Kinsinger, MD;  Location: [facility] OR;  Service: General;;  . LUMBAR DISC SURGERY  01-31-14   per Dr. [name]   . OOPHORECTOMY     left overy  . OVARIAN CYST REMOVAL    . RECTOCELE REPAIR    . SINUSOTOMY  12/14/06  . spinal fusion  2016/10  . TONSILLECTOMY    . TONSILLECTOMY    . VAGINAL HYSTERECTOMY    . WRIST GANGLION EXCISION Left     Social History:  reports that she quit smoking about 22 years ago. Her smoking use included cigarettes. She has never used smokeless tobacco. She reports that she does not drink alcohol or use drugs.  Lives at home with her husband.  Ambulates short distances without assistance, but longer than that and she needs a walker   Allergies  Allergen Reactions  . Gadolinium Derivatives Swelling and Other (See Comments)    Arm swelling, tingling, & redness. Radiologist Dr. Grace Isaac recommends an injection of steroids, or 13-hour prep if non-emergent, if patient needs a gadolinium based contrast in the future.  . Other Rash and Other (See Comments)    Glue or tape to seal up wound  . Zoloft [Sertraline Hcl] Other (See Comments)    Hair loss    Family History  Problem Relation Age of Onset  . Fibromyalgia Mother   . Diabetes Mother   . Thyroid cancer Mother   . Liver disease Mother   . Neuropathy Mother   . Cirrhosis Mother        non-alcoholic  . Pulmonary embolism Mother        both lungs  . Heart failure Mother   . Hypertension Father   . Diabetes Father   . Heart disease Father   . Prostate cancer Father   . Heart attack Father        x 2  . Colon cancer Neg Hx   . Other Neg Hx        pheochromocytoma  . Colon polyps Neg Hx       Prior to Admission medications   Medication Sig Start Date End Date Taking? Authorizing Provider  albuterol (VENTOLIN HFA) 108 (90 Base) MCG/ACT inhaler Inhale 2 puffs into the  lungs every 4 (four) hours as needed for wheezing or shortness of breath. 08/25/19  Yes [name], MD  ALPRAZolam [name]) 0.5 MG tablet Take 0.5 mg by mouth 3 (three) times daily as needed for anxiety.  05/13/19  Yes [provider]  calcium carbonate (TUMS - DOSED IN MG ELEMENTAL CALCIUM) 500 MG chewable tablet Chew 1 tablet by mouth 2 (two) times daily as needed for indigestion or heartburn.   Yes [provider]  cetirizine (ZYRTEC) 10 MG tablet Take 10 mg by mouth daily.   Yes [provider]  chlorhexidine (PERIDEX) 0.12 % solution Use as directed 15  mLs in the mouth or throat 2 (two) times daily.    Yes [provider]  cyanocobalamin (,VITAMIN B-12,) 1000 MCG/ML injection INJECT 1 ML (1,000 MCG TOTAL) INTO THE MUSCLE EVERY 7 DAYS. Patient taking differently: Inject 1,000 mcg into the muscle once a week.  07/21/19  Yes [name], MD  diazepam (VALIUM) 5 MG tablet Take 1 tablet (5 mg total) by mouth every 8 (eight) hours as needed for muscle spasms (muscle spasm/pain). 07/18/19  Yes [name], MD  famotidine (PEPCID) 40 MG tablet TAKE 1 TABLET BY MOUTH EVERYDAY AT BEDTIME Patient taking differently: Take 40 mg by mouth at bedtime.  03/14/19  Yes [name], MD  fluticasone (FLONASE) 50 MCG/ACT nasal spray Place 2 sprays into both nostrils every evening.    Yes [provider]  gabapentin (NEURONTIN) 300 MG capsule TAKE 1 CAPSULE BY MOUTH THREE TIMES A DAY Patient taking differently: Take 300 mg by mouth 3 (three) times daily.  08/07/19  Yes [name], MD  hydrochlorothiazide (HYDRODIURIL) 25 MG tablet TAKE 1 TABLET BY MOUTH EVERY DAY Patient taking differently: Take 25 mg by mouth daily.  08/10/19  Yes [name], MD  HYDROmorphone (DILAUDID) 8 MG tablet Take 1 tablet (8 mg total) by mouth every 6 (six) hours as needed for severe pain. 09/18/19 10/18/19 Yes [name], MD  Multiple Vitamins-Minerals (MULTI-VITAMIN GUMMIES PO)  Take 2 tablets by mouth daily.    Yes [provider]  nitrofurantoin, macrocrystal-monohydrate, (MACROBID) 100 MG capsule Take 100 mg by mouth daily as needed (for uti prophylaxis, after intercourse).  09/26/16  Yes [provider]  oxyCODONE (ROXICODONE) 15 MG immediate release tablet Take 1 tablet (15 mg total) by mouth every 4 (four) hours as needed for pain. 09/18/19 10/18/19 Yes [name], MD  pantoprazole (PROTONIX) 40 MG tablet Take 1 tablet (40 mg total) by mouth every morning. Take 30 minutes before breakfast. 11/23/18  Yes Meredith Pel, NP  promethazine (PHENERGAN) 25 MG tablet TAKE 1 TABLET BY MOUTH EVERY 6 HOURS AS NEEDED FOR NAUSEA Patient taking differently: Take 25 mg by mouth every 6 (six) hours as needed for nausea or vomiting.  02/19/17  Yes [name], MD  sucralfate (CARAFATE) 1 g tablet Take 1 tablet (1 g total) by mouth 4 (four) times daily. Patient taking differently: Take 1 g by mouth daily.  06/08/19  Yes [name], MD  SUMAtriptan (IMITREX) 100 MG tablet Take 1 tablet earliest onset of migraine.  May repeat once in 2 hours if headache persists or recurs. Patient taking differently: Take 100 mg by mouth every 2 (two) hours as needed for migraine.  10/14/16  Yes Jaffe, Adam R, DO  Vitamin D, Ergocalciferol, (DRISDOL) 1.25 MG (50000 UT) CAPS capsule TAKE 1 CAPSULE (50,000 UNITS TOTAL) BY MOUTH EVERY 7 (SEVEN) DAYS. 07/05/18  Yes [name], MD  doxycycline (VIBRAMYCIN) 100 MG capsule Take 1 capsule (100 mg total) by mouth 2 (two) times daily for 10 days. Patient not taking: Reported on 08/28/2019 08/25/19 09/04/19  [name], MD  fluconazole (DIFLUCAN) 150 MG tablet TAKE 1 TABLET BY MOUTH EVERY DAY Patient not taking: Reported on 08/28/2019 06/19/19   [name], MD  gabapentin (NEURONTIN) 100 MG capsule Take 1 capsule (100 mg total) by mouth 3 (three) times daily. Patient not taking: Reported on 08/28/2019 12/07/18   [name], MD   pramipexole (MIRAPEX) 1 MG tablet Take  1 or 2 tabs at bedtime Patient not taking: Reported on 08/28/2019 12/07/18   Nelwyn SalisburyFry, [name], MD  SYRINGE-NEEDLE, DISP, 3 ML (B-D 3CC LUER-LOK SYR 25GX1") 25G X 1" 3 ML MISC USE AS DIRECTED 05/15/19   Nelwyn SalisburyFry, [name], MD    Physical Exam: BP 132/88 (BP Location: Left Arm)   Pulse 88   Temp 97.9 F (36.6 C) (Oral)   Resp 18   Ht 5\' 9"  (1.753 m)   Wt 119.8 kg   LMP 12/13/1999 Comment: single oophorectomy//a.c.  SpO2 93%   BMI 39.00 kg/m   General: Alert and oriented x3, mild distress from anxiety Eyes: Sclera nonicteric, extraocular movements are intact ENT: Normocephalic and atraumatic, mucous membranes slightly dry Neck: Thick, narrow airway Cardiovascular: Regular rhythm, mildly tachycardic Respiratory: Decreased breath sounds bibasilar, also in part due to body habitus Abdomen: Soft, obese, nontender, positive bowel sounds Skin: No skin breaks, tears or lesions Musculoskeletal: No clubbing or cyanosis, trace pitting edema Psychiatric: Appropriate, no evidence of psychoses Neurologic: No focal deficits          Labs on Admission:  Basic Metabolic Panel: Recent Labs  Lab 08/28/19 1300  NA 138  K 3.6  CL 98  CO2 26  GLUCOSE 151*  BUN 15  CREATININE 0.86  CALCIUM 8.2*   Liver Function Tests: Recent Labs  Lab 08/28/19 1300  AST 59*  ALT 55*  ALKPHOS 56  BILITOT 0.3  PROT 7.2  ALBUMIN 3.4*   No results for input(s): LIPASE, AMYLASE in the last 168 hours. No results for input(s): AMMONIA in the last 168 hours. CBC: Recent Labs  Lab 08/28/19 1300  WBC 6.1  NEUTROABS 4.8  HGB 15.3*  HCT 45.5  MCV 87.0  PLT 193   Cardiac Enzymes: No results for input(s): CKTOTAL, CKMB, CKMBINDEX, TROPONINI in the last 168 hours.  BNP (last 3 results) No results for input(s): BNP in the last 8760 hours.  ProBNP (last 3 results) No results for input(s): PROBNP in the last 8760 hours.  CBG: No results for input(s): GLUCAP in the  last 168 hours.  Radiological Exams on Admission: DG Chest Port 1 View  Result Date: 08/28/2019 CLINICAL DATA:  Increased shortness of breath with oxygen desaturation. Recent COVID-19 infection. EXAM: PORTABLE CHEST 1 VIEW COMPARISON:  Radiographs 06/21/2018 and 06/17/2018. CT 06/17/2018. FINDINGS: 1345 hours. There are lower lung volumes with asymmetric elevation of the right hemidiaphragm. The heart size and mediastinal contours are stable and within normal limits for body habitus. There are increased right greater than left perihilar opacities which may reflect atelectasis or atypical inflammation. No consolidation, pleural effusion or pneumothorax. The bones appear unchanged. IMPRESSION: Worsening right greater than left perihilar opacities consistent with worsening atelectasis or atypical inflammation such as viral pneumonia. Electronically Signed   By: Carey BullocksWilliam  Veazey M.D.   On: 08/28/2019 14:01    EKG: Independently reviewed.  Sinus tachycardia  Assessment/Plan Principal problem: COVID-19 infection causing acute respiratory failure with hypoxia: Given elevated CRP and hypoxia plus infiltrate, have given 1 dose of Actemra and started her on remdesivir for the next 5 days.  IV steroids.  Continue oxygen supplementation.  Monitor CBGs given continuous steroids.  No previous history of diabetes mellitus.  Place in Covid isolation.  elevated D-dimer, so will check CT angio to rule out PE. Present on Admission: . HTN (hypertension) . Spinal stenosis of lumbar region with chronic back pain: Patient takes oxycodone every 4 hours most consistently.  Currently with her esophagitis/severe  nausea, she is unable to do so.  Have converted her oxycodone to morphine and will convert back when she is able to take better p.o. . GERD (gastroesophageal reflux disease): PPI . ANXIETY DISORDER, GENERALIZED: As needed IV Ativan . Obesity (BMI 30-39.9): Patient meets criteria BMI greater than 30 . Depression .  Hereditary and idiopathic peripheral neuropathy: Holding on Neurontin . DDD (degenerative disc disease), lumbar . Major depressive disorder with single episode, in partial remission (HCC)   Principal Problem:   Acute respiratory failure with hypoxia (HCC) Active Problems:   Obesity (BMI 30-39.9)   ANXIETY DISORDER, GENERALIZED   Depression   Hereditary and idiopathic peripheral neuropathy   GERD (gastroesophageal reflux disease)   HTN (hypertension)   Spinal stenosis of lumbar region   DDD (degenerative disc disease), lumbar   Major depressive disorder with single episode, in partial remission ([place])   Chronic pain   Acute respiratory disease due to COVID-19 virus   DVT prophylaxis: Lovenox  Code Status: Full code  Family Communication: Updated husband by phone  Disposition Plan: Anticipate she will be here for several days until she has weaned off of oxygen  Consults called: None  Admission status: Patient will need acute hospital services well past 2 midnights, admit as inpatient    Annita Brod MD [facility] Pager [phone]  If 7PM-7AM, please contact night-coverage [email or web address] Password [facility]  08/28/2019, 5:54 PM

## 2019-08-28 NOTE — IPOC Note (Signed)
Pt became anxious while ambulating with CNA and wanted something to calm her down. Take anti- anxiety medication at home - so gave her a PRN dose. Settled and simmered quickly once in bed- able to void and voice needs

## 2019-08-28 NOTE — Telephone Encounter (Signed)
Tell her to go to the ER asap to be evaluated

## 2019-08-28 NOTE — ED Notes (Signed)
Attempt blood draw to left hand and left forearm unsuccessful.

## 2019-08-28 NOTE — Telephone Encounter (Signed)
FYI  Spoke to pt and advised of Dr.Frys note. Pt sounded very "shaky" and weak. Pt stated "  I don't feel too good. I advised pt to go right away. Pt verbalized understanding.

## 2019-08-29 DIAGNOSIS — J9601 Acute respiratory failure with hypoxia: Secondary | ICD-10-CM | POA: Diagnosis not present

## 2019-08-29 DIAGNOSIS — M5136 Other intervertebral disc degeneration, lumbar region: Secondary | ICD-10-CM | POA: Diagnosis not present

## 2019-08-29 DIAGNOSIS — U071 COVID-19: Secondary | ICD-10-CM | POA: Diagnosis not present

## 2019-08-29 DIAGNOSIS — J069 Acute upper respiratory infection, unspecified: Secondary | ICD-10-CM | POA: Diagnosis not present

## 2019-08-29 LAB — CBC WITH DIFFERENTIAL/PLATELET
Abs Immature Granulocytes: 0.01 10*3/uL (ref 0.00–0.07)
Basophils Absolute: 0 10*3/uL (ref 0.0–0.1)
Basophils Relative: 1 %
Eosinophils Absolute: 0 10*3/uL (ref 0.0–0.5)
Eosinophils Relative: 0 %
HCT: 42.7 % (ref 36.0–46.0)
Hemoglobin: 13.9 g/dL (ref 12.0–15.0)
Immature Granulocytes: 1 %
Lymphocytes Relative: 34 %
Lymphs Abs: 0.8 10*3/uL (ref 0.7–4.0)
MCH: 28.8 pg (ref 26.0–34.0)
MCHC: 32.6 g/dL (ref 30.0–36.0)
MCV: 88.6 fL (ref 80.0–100.0)
Monocytes Absolute: 0.1 10*3/uL (ref 0.1–1.0)
Monocytes Relative: 4 %
Neutro Abs: 1.3 10*3/uL — ABNORMAL LOW (ref 1.7–7.7)
Neutrophils Relative %: 60 %
Platelets: 194 10*3/uL (ref 150–400)
RBC: 4.82 MIL/uL (ref 3.87–5.11)
RDW: 14 % (ref 11.5–15.5)
WBC: 2.2 10*3/uL — ABNORMAL LOW (ref 4.0–10.5)
nRBC: 0 % (ref 0.0–0.2)

## 2019-08-29 LAB — COMPREHENSIVE METABOLIC PANEL
ALT: 40 U/L (ref 0–44)
AST: 37 U/L (ref 15–41)
Albumin: 3.1 g/dL — ABNORMAL LOW (ref 3.5–5.0)
Alkaline Phosphatase: 47 U/L (ref 38–126)
Anion gap: 11 (ref 5–15)
BUN: 13 mg/dL (ref 6–20)
CO2: 23 mmol/L (ref 22–32)
Calcium: 8.5 mg/dL — ABNORMAL LOW (ref 8.9–10.3)
Chloride: 106 mmol/L (ref 98–111)
Creatinine, Ser: 0.65 mg/dL (ref 0.44–1.00)
GFR calc Af Amer: >60 mL/min (ref >60)
GFR calc non Af Amer: >60 mL/min (ref >60)
Glucose, Bld: 157 mg/dL — ABNORMAL HIGH (ref 70–99)
Potassium: 4.3 mmol/L (ref 3.5–5.1)
Sodium: 140 mmol/L (ref 135–145)
Total Bilirubin: 0.5 mg/dL (ref 0.3–1.2)
Total Protein: 6.8 g/dL (ref 6.5–8.1)

## 2019-08-29 LAB — URINALYSIS, ROUTINE W REFLEX MICROSCOPIC
Bilirubin Urine: NEGATIVE
Glucose, UA: NEGATIVE mg/dL
Hgb urine dipstick: NEGATIVE
Ketones, ur: 20 mg/dL — AB
Leukocytes,Ua: NEGATIVE
Nitrite: NEGATIVE
Protein, ur: 30 mg/dL — AB
Specific Gravity, Urine: >1.046 — ABNORMAL HIGH (ref 1.005–1.030)
pH: 6 (ref 5.0–8.0)

## 2019-08-29 LAB — HIV ANTIBODY (ROUTINE TESTING W REFLEX): HIV Screen 4th Generation wRfx: NONREACTIVE

## 2019-08-29 LAB — GLUCOSE, CAPILLARY
Glucose-Capillary: 111 mg/dL — ABNORMAL HIGH (ref 70–99)
Glucose-Capillary: 111 mg/dL — ABNORMAL HIGH (ref 70–99)
Glucose-Capillary: 106 mg/dL — ABNORMAL HIGH (ref 70–99)
Glucose-Capillary: 128 mg/dL — ABNORMAL HIGH (ref 70–99)

## 2019-08-29 LAB — PROCALCITONIN: Procalcitonin: 0.29 ng/mL

## 2019-08-29 LAB — C-REACTIVE PROTEIN: CRP: 9.3 mg/dL — ABNORMAL HIGH (ref <1.0)

## 2019-08-29 LAB — D-DIMER, QUANTITATIVE: D-Dimer, Quant: 0.46 ug/mL-FEU (ref 0.00–0.50)

## 2019-08-29 LAB — FERRITIN: Ferritin: 832 ng/mL — ABNORMAL HIGH (ref 11–307)

## 2019-08-29 MED ORDER — ENSURE ENLIVE PO LIQD
237.0000 mL | Freq: Two times a day (BID) | ORAL | Status: DC
  Administered 2019-08-29 – 2019-09-05 (×7): 237 mL via ORAL

## 2019-08-29 MED ORDER — GABAPENTIN 300 MG PO CAPS
300.0000 mg | ORAL_CAPSULE | Freq: Three times a day (TID) | ORAL | Status: DC
  Administered 2019-08-29 – 2019-09-05 (×23): 300 mg via ORAL
  Filled 2019-08-29 (×23): qty 1

## 2019-08-29 MED ORDER — LORATADINE 10 MG PO TABS
10.0000 mg | ORAL_TABLET | Freq: Every day | ORAL | Status: DC
  Administered 2019-08-29 – 2019-09-05 (×8): 10 mg via ORAL
  Filled 2019-08-29 (×8): qty 1

## 2019-08-29 MED ORDER — PANTOPRAZOLE SODIUM 40 MG PO TBEC
40.0000 mg | DELAYED_RELEASE_TABLET | Freq: Every day | ORAL | Status: DC
  Administered 2019-08-29 – 2019-09-05 (×8): 40 mg via ORAL
  Filled 2019-08-29 (×8): qty 1

## 2019-08-29 MED ORDER — FLUTICASONE PROPIONATE 50 MCG/ACT NA SUSP
2.0000 | Freq: Every evening | NASAL | Status: DC
  Administered 2019-08-29 – 2019-09-04 (×8): 2 via NASAL
  Filled 2019-08-29: qty 16

## 2019-08-29 NOTE — Plan of Care (Signed)
  Problem: Respiratory: Goal: Will maintain a patent airway Outcome: Progressing Goal: Complications related to the disease process, condition or treatment will be avoided or minimized Outcome: Progressing   

## 2019-08-29 NOTE — Progress Notes (Signed)
[facility]                                                                              Patient Demographics  [name], is a 44 y.o. female, DOB - [birth year], QPY:[id number]  Admit date - 08/28/2019   Admitting Physician Hollice Espy, MD  Outpatient Primary MD for the patient is [name], MD  Outpatient specialists:   LOS - 1  days   Medical records reviewed and are as summarized below:    chief complaint: Shortness of breath, nausea      Brief summary   Patient is a 43 year old female with history of obesity, chronic back pain from lumbar disc disease on chronic opioids, anxiety/depression, hypertension, GERD, presented to ED with trouble breathing.  Patient reported that her husband had tested positive for Covid last month.  Approximately 10 days ago she started noticing some mild symptoms such as cough and mild fevers.  In ED, patient was found to have O2 sats 87% on room air, placed on 2 L.  CRP 9.8, ferritin 1300, procalcitonin level mildly elevated at 0.52 but normal WBCs.   COVID-19 PCR positive  Assessment & Plan    Principal Problem:  Acute hypoxic respiratory failure due to acute COVID-19 viral pneumonia during the ongoing COVID-19 pandemic- POA - Patient presented with shortness of breath, nausea, fevers, coughing.  CTA showed no acute pulmonary emboli, diffuse bilateral groundglass airspace opacities - Currently hypoxic, requiring 2 Lnasal cannula -Continue IV Decadron, remdesivir day #2, CRP elevated 9.8 on admission, received Actemra 3/29  - Continue Supportive care: vitamin C/zinc, albuterol, Tylenol. - Continue to wean oxygen, ambulatory O2 screening daily as tolerated  - Oxygen - SpO2: 92 % O2 Flow Rate (L/min): 2 L/min - Continue to follow labs as below, inflammatory markers trending down  Lab Results  Component Value Date   SARSCOV2NAA POSITIVE (A) 08/28/2019   SARSCOV2NAA Not Detected 12/15/2018     Recent Labs    Lab 08/28/19 1300 08/28/19 1318 08/29/19 0420  DDIMER  --  0.62* 0.46  FERRITIN  --  1,352* 832*  CRP  --  9.8* 9.3*  ALT 55*  --  40  PROCALCITON  --  0.52 0.29    Active Problems: Nausea, with history of GERD -Likely exacerbated due to Covid -Patient feels nausea is improving, will start the diet today.  Hold IV fluids for now, continue IV antiemetics as needed -Add nutritional supplements   Anxiety/depression -Currently on IV Ativan as needed for anxiety  History of spinal stenosis of the lumbar region, DDD (degenerative disc disease), chronic pain syndrome, peripheral neuropathy -Currently on IV pain medications as needed due to persistent nausea -Pain is currently controlled     GERD (gastroesophageal reflux disease) -Placed on PPI, also takes Pepcid and Carafate, continue    HTN (hypertension) -BP currently stable, hold HCTZ    Obesity (BMI 30-39.9) BMI 38.9   Code Status: Full CODE STATUS DVT Prophylaxis:  Lovenox  Family Communication: Discussed all imaging results, lab results, explained to the patient's husband on the phone   Disposition Plan: Patient from home, anticipate discharge home once  hypoxia improved, tolerating solid diet, currently on IV dexamethasone, remdesivir, received Actemra on 3/29  Time Spent in minutes 35 minutes  Procedures:  CT angiogram chest  Consultants:   None  Antimicrobials:   Anti-infectives (From admission, onward)   Start     Dose/Rate Route Frequency Ordered Stop   08/29/19 1000  remdesivir 100 mg in sodium chloride 0.9 % 100 mL IVPB     100 mg 200 mL/hr over 30 Minutes Intravenous Daily 08/28/19 1750 09/02/19 0959   08/28/19 1900  remdesivir 200 mg in sodium chloride 0.9% 250 mL IVPB     200 mg 580 mL/hr over 30 Minutes Intravenous Once 08/28/19 1750 08/29/19 0045          Medications  Scheduled Meds: . vitamin C  500 mg Oral Daily  . dexamethasone (DECADRON) injection  6 mg Intravenous Q24H  .  enoxaparin (LOVENOX) injection  60 mg Subcutaneous Q24H  . insulin aspart  0-9 Units Subcutaneous TID WC  . sodium chloride flush  3 mL Intravenous Once  . sucralfate  1 g Oral TID WC & HS  . zinc sulfate  220 mg Oral Daily   Continuous Infusions: . remdesivir 100 mg in NS 100 mL 100 mg (08/29/19 0833)   PRN Meds:.acetaminophen, LORazepam, morphine injection, polyethylene glycol, promethazine, promethazine      Subjective:   [name] was seen and examined today.  Still nauseous. No acute shortness of breath. Patient denies dizziness, chest pain, abdominal pain, N/V/D/C, new weakness, numbess, tingling. No acute events overnight.    Objective:   Vitals:   08/28/19 2231 08/29/19 0219 08/29/19 0714 08/29/19 1023  BP:  126/87 111/81   Pulse:  86 78   Resp: (!) 22 20 20    Temp:  97.9 F (36.6 C) 97.8 F (36.6 C)   TempSrc:      SpO2: 95% 96% 94% 92%  Weight:      Height:        Intake/Output Summary (Last 24 hours) at 08/29/2019 1055 Last data filed at 08/29/2019 1023 Gross per 24 hour  Intake 1619.05 ml  Output 300 ml  Net 1319.05 ml     Wt Readings from Last 3 Encounters:  08/28/19 119.8 kg  08/21/19 122.5 kg  07/03/19 124 kg     Exam  General: Alert and oriented x 3, no acute respiratory distress, ill-appearing  Cardiovascular: S1 S2 auscultated, no murmurs, RRR  Respiratory: Decreased breath sounds at the bases  Gastrointestinal: Soft, nontender, nondistended, + bowel sounds  Ext: no pedal edema bilaterally  Neuro: No new deficits  Musculoskeletal: No digital cyanosis, clubbing  Skin: No rashes  Psych: Normal affect and demeanor, alert and oriented x3    Data Reviewed:  I have personally reviewed following labs and imaging studies  Micro Results Recent Results (from the past 240 hour(s))  Blood Culture (routine x 2)     Status: None (Preliminary result)   Collection Time: 08/28/19  1:17 PM   Specimen: BLOOD RIGHT HAND  Result Value Ref  Range Status   Specimen Description   Final    BLOOD RIGHT HAND Performed at [facility], [address] [place], [place] [address]    Special Requests   Final    BOTTLES DRAWN AEROBIC ONLY Blood Culture results may not be optimal due to an inadequate volume of blood received in culture bottles Performed at [facility], [address] [place], [place] [address]    Culture  Final    NO GROWTH < 24 HOURS Performed at [facility], [address], [place], [place] [address]    Report Status PENDING  Incomplete  Blood Culture (routine x 2)     Status: None (Preliminary result)   Collection Time: 08/28/19  1:18 PM   Specimen: BLOOD  Result Value Ref Range Status   Specimen Description   Final    BLOOD RIGHT ANTECUBITAL Performed at [facility] [address], [place], [place] [address]    Special Requests   Final    BOTTLES DRAWN AEROBIC AND ANAEROBIC Blood Culture adequate volume Performed at [facility] [address] [place], [place] [address]    Culture   Final    NO GROWTH < 24 HOURS Performed at [facility] [address] [place], [place] [address]    Report Status PENDING  Incomplete  Respiratory Panel by RT PCR (Flu A&B, Covid) - Nasopharyngeal Swab     Status: Abnormal   Collection Time: 08/28/19  3:06 PM   Specimen: Nasopharyngeal Swab  Result Value Ref Range Status   SARS Coronavirus 2 by RT PCR POSITIVE (A) NEGATIVE Final    Comment: RESULT CALLED TO, READ BACK BY AND VERIFIED WITH: PONDS,J. RN @1814  ON 03.29.2021 BY COHEN,K (NOTE) SARS-CoV-2 target nucleic acids are DETECTED. SARS-CoV-2 RNA is generally detectable in upper respiratory specimens  during the acute phase of infection. Positive results are indicative of the presence of the identified virus, but do not rule out bacterial infection or co-infection with other pathogens not detected by the test. Clinical  correlation with patient history and other diagnostic information is necessary to determine patient infection status. The expected result is Negative. Fact Sheet for Patients:  PinkCheek.be Fact Sheet for Healthcare Providers: GravelBags.it This test is not yet approved or cleared by the [place] FDA and  has been authorized for detection and/or diagnosis of SARS-CoV-2 by FDA under an Emergency Use Authorization (EUA).  This EUA will remain in effect (meaning this test can be  used) for the duration of  the COVID-19 declaration under Section 564(b)(1) of the Act, 21 U.S.C. section 360bbb-3(b)(1), unless the authorization is terminated or revoked sooner.    Influenza A by PCR NEGATIVE NEGATIVE Final   Influenza B by PCR NEGATIVE NEGATIVE Final    Comment: (NOTE) The Xpert Xpress SARS-CoV-2/FLU/RSV assay is intended as an aid in  the diagnosis of influenza from Nasopharyngeal swab specimens and  should not be used as a sole basis for treatment. Nasal washings and  aspirates are unacceptable for Xpert Xpress SARS-CoV-2/FLU/RSV  testing. Fact Sheet for Patients: PinkCheek.be Fact Sheet for Healthcare Providers: GravelBags.it This test is not yet approved or cleared by the [place] FDA and  has been authorized for detection and/or diagnosis of SARS-CoV-2 by  FDA under an Emergency Use Authorization (EUA). This EUA will remain  in effect (meaning this test can be used) for the duration of the  Covid-19 declaration under Section 564(b)(1) of the Act, 21  U.S.C. section 360bbb-3(b)(1), unless the authorization is  terminated or revoked. Performed at [facility], [place] [address], [place],  [address]     Radiology Reports DG Thoracic Spine 2 View  Result Date: 08/21/2019 CLINICAL DATA:  Back pain radiating to bilateral lower extremities,  no history of injury, recent COVID-19 vaccination EXAM: THORACIC SPINE 2 VIEWS; LUMBAR SPINE - COMPLETE 4+ VIEW COMPARISON:  08/07/2019 FINDINGS: Thoracic spine: Frontal  and lateral views of the thoracic spine demonstrates anatomic alignment. There are no fractures. Disc spaces are well preserved. Paraspinal soft tissues are normal. Lumbar spine: Frontal, bilateral oblique, lateral views of the lumbar spine are obtained. Continued postsurgical changes from laminectomy, posterior fusion, and discectomy spanning L3 through S1. No change in positioning of the orthopedic hardware. No evidence of metallic hardware failure or loosening. No fractures. Nonsurgical disc spaces are well preserved. Paraspinal soft tissues are normal. IMPRESSION: 1. No acute fracture within the thoracic and lumbar spine. 2. Stable postsurgical changes from L3 through S1. Electronically Signed   By: Sharlet Salina M.D.   On: 08/21/2019 16:15   DG Lumbar Spine Complete  Result Date: 08/21/2019 CLINICAL DATA:  Back pain radiating to bilateral lower extremities, no history of injury, recent COVID-19 vaccination EXAM: THORACIC SPINE 2 VIEWS; LUMBAR SPINE - COMPLETE 4+ VIEW COMPARISON:  08/07/2019 FINDINGS: Thoracic spine: Frontal and lateral views of the thoracic spine demonstrates anatomic alignment. There are no fractures. Disc spaces are well preserved. Paraspinal soft tissues are normal. Lumbar spine: Frontal, bilateral oblique, lateral views of the lumbar spine are obtained. Continued postsurgical changes from laminectomy, posterior fusion, and discectomy spanning L3 through S1. No change in positioning of the orthopedic hardware. No evidence of metallic hardware failure or loosening. No fractures. Nonsurgical disc spaces are well preserved. Paraspinal soft tissues are normal. IMPRESSION: 1. No acute fracture within the thoracic and lumbar spine. 2. Stable postsurgical changes from L3 through S1. Electronically Signed   By: Sharlet Salina  M.D.   On: 08/21/2019 16:15   CT ANGIO CHEST PE W OR WO CONTRAST  Result Date: 08/28/2019 CLINICAL DATA:  Respiratory failure.  COVID-19 positive. EXAM: CT ANGIOGRAPHY CHEST WITH CONTRAST TECHNIQUE: Multidetector CT imaging of the chest was performed using the standard protocol during bolus administration of intravenous contrast. Multiplanar CT image reconstructions and MIPs were obtained to evaluate the vascular anatomy. CONTRAST:  OMNIPAQUE IOHEXOL 350 MG/ML SOLN COMPARISON:  June 17, 2018. FINDINGS: Cardiovascular: Evaluation is limited by respiratory motion artifact.Given this limitation, no acute pulmonary embolism was detected. The main pulmonary artery is within normal limits for size. There is no CT evidence of acute right heart strain. The visualized aorta is normal. Heart size is normal, without pericardial effusion. Mediastinum/Nodes: --No mediastinal or hilar lymphadenopathy. --No axillary lymphadenopathy. --No supraclavicular lymphadenopathy. --Normal thyroid gland. --The esophagus is unremarkable Lungs/Pleura: Diffuse bilateral ground-glass airspace opacities are noted, consistent with the patient's history of viral pneumonia. There is no pneumothorax. No large pleural effusion. The trachea is unremarkable. Upper Abdomen: There is diffuse hepatic steatosis. Musculoskeletal: No chest wall abnormality. No acute or significant osseous findings. Review of the MIP images confirms the above findings. IMPRESSION: 1. Evaluation for pulmonary emboli is limited by respiratory motion artifact. Given this limitation, no acute pulmonary embolism was detected. 2. Diffuse bilateral ground-glass airspace opacities are noted, consistent with the patient's history of viral pneumonia. 3. Hepatic steatosis. Electronically Signed   By: Katherine Mantle M.D.   On: 08/28/2019 23:30   CT LUMBAR SPINE WO CONTRAST  Result Date: 08/07/2019 CLINICAL DATA:  Left-sided back pain and sciatica EXAM: CT LUMBAR SPINE  WITHOUT CONTRAST TECHNIQUE: Multidetector CT imaging of the lumbar spine was performed without intravenous contrast administration. Multiplanar CT image reconstructions were also generated. COMPARISON:  10/12/2018 FINDINGS: Segmentation: 5 lumbar type vertebrae Alignment: Normal Vertebrae: L3-4 and L4-5 PLIF and broad laminectomy. Arthrodesis is solid and hardware is in place. L5-S1 ALIF and posterior lumbar rod  and pedicle screw fixation. Bony density at the intervertebral graft is less dense than before and arthrodesis status is uncertain at this location. There is subtle lucency around the S1 screws which is unchanged. There is some progressive spurring about the L5-S1 interbody spacer, especially at the L5 inferior endplate, suggesting ongoing motion but there does appear to be bridging bone at the posterolateral fusion, especially on the right. Paraspinal and other soft tissues: Expected postoperative scarring. Disc levels: T12- L1: Unremarkable. L1-L2: Minor facet spurring.  No visible herniation or impingement L2-L3: Minor facet spurring.  No visible herniation or impingement L3-L4: PLIF with solid arthrodesis.  No visible impingement L4-L5: PLIF with solid arthrodesis.  No visible impingement L5-S1:Fusion as described above. IMPRESSION: 1. L5-S1 ALIF with uncertain interbody arthrodesis but there does appear to be bridging posterior-lateral bony graft. 2. L3-4 and L4-5 solid interbody arthrodesis. 3. No visible impingement. 4. 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 Electronically Signed   By: Marnee Spring M.D.   On: 08/07/2019 10:50   DG Chest Port 1 View  Result Date: 08/28/2019 CLINICAL DATA:  Increased shortness of breath with oxygen desaturation. Recent COVID-19 infection. EXAM: PORTABLE CHEST 1 VIEW COMPARISON:  Radiographs 06/21/2018 and 06/17/2018. CT 06/17/2018. FINDINGS: 1345 hours. There are lower lung volumes with asymmetric elevation of the right hemidiaphragm. The heart size and mediastinal contours  are stable and within normal limits for body habitus. There are increased right greater than left perihilar opacities which may reflect atelectasis or atypical inflammation. No consolidation, pleural effusion or pneumothorax. The bones appear unchanged. IMPRESSION: Worsening right greater than left perihilar opacities consistent with worsening atelectasis or atypical inflammation such as viral pneumonia. Electronically Signed   By: Carey Bullocks M.D.   On: 08/28/2019 14:01    Lab Data:  CBC: Recent Labs  Lab 08/28/19 1300 08/29/19 0420  WBC 6.1 2.2*  NEUTROABS 4.8 1.3*  HGB 15.3* 13.9  HCT 45.5 42.7  MCV 87.0 88.6  PLT 193 194   Basic Metabolic Panel: Recent Labs  Lab 08/28/19 1300 08/29/19 0420  NA 138 140  K 3.6 4.3  CL 98 106  CO2 26 23  GLUCOSE 151* 157*  BUN 15 13  CREATININE 0.86 0.65  CALCIUM 8.2* 8.5*   GFR: Estimated Creatinine Clearance: 122.8 mL/min (by C-G formula based on SCr of 0.65 mg/dL). Liver Function Tests: Recent Labs  Lab 08/28/19 1300 08/29/19 0420  AST 59* 37  ALT 55* 40  ALKPHOS 56 47  BILITOT 0.3 0.5  PROT 7.2 6.8  ALBUMIN 3.4* 3.1*   No results for input(s): LIPASE, AMYLASE in the last 168 hours. No results for input(s): AMMONIA in the last 168 hours. Coagulation Profile: No results for input(s): INR, PROTIME in the last 168 hours. Cardiac Enzymes: No results for input(s): CKTOTAL, CKMB, CKMBINDEX, TROPONINI in the last 168 hours. BNP (last 3 results) No results for input(s): PROBNP in the last 8760 hours. HbA1C: No results for input(s): HGBA1C in the last 72 hours. CBG: Recent Labs  Lab 08/28/19 1853 08/28/19 2257 08/29/19 0812  GLUCAP 95 136* 111*   Lipid Profile: Recent Labs    08/28/19 1300  TRIG 275*   Thyroid Function Tests: No results for input(s): TSH, T4TOTAL, FREET4, T3FREE, THYROIDAB in the last 72 hours. Anemia Panel: Recent Labs    08/28/19 1318 08/29/19 0420  FERRITIN 1,352* 832*   Urine  analysis:    Component Value Date/Time   COLORURINE YELLOW 06/21/2018 1915   APPEARANCEUR CLEAR 06/21/2018 1915   LABSPEC  1.015 06/21/2018 1915   PHURINE 7.0 06/21/2018 1915   GLUCOSEU NEGATIVE 06/21/2018 1915   HGBUR NEGATIVE 06/21/2018 1915   HGBUR negative 04/04/2007 1513   BILIRUBINUR NEGATIVE 06/21/2018 1915   BILIRUBINUR negative 12/24/2017 1316   KETONESUR NEGATIVE 06/21/2018 1915   PROTEINUR NEGATIVE 06/21/2018 1915   UROBILINOGEN 0.2 12/24/2017 1316   UROBILINOGEN 0.2 04/08/2015 1242   NITRITE NEGATIVE 06/21/2018 1915   LEUKOCYTESUR NEGATIVE 06/21/2018 1915     Tonetta Napoles M.D. [facility] 08/29/2019, 10:55 AM   Call night coverage person covering after 7pm

## 2019-08-30 DIAGNOSIS — F324 Major depressive disorder, single episode, in partial remission: Secondary | ICD-10-CM

## 2019-08-30 DIAGNOSIS — U071 COVID-19: Secondary | ICD-10-CM | POA: Diagnosis not present

## 2019-08-30 DIAGNOSIS — I10 Essential (primary) hypertension: Secondary | ICD-10-CM

## 2019-08-30 DIAGNOSIS — K219 Gastro-esophageal reflux disease without esophagitis: Secondary | ICD-10-CM

## 2019-08-30 DIAGNOSIS — G894 Chronic pain syndrome: Secondary | ICD-10-CM | POA: Diagnosis not present

## 2019-08-30 DIAGNOSIS — M5136 Other intervertebral disc degeneration, lumbar region: Secondary | ICD-10-CM | POA: Diagnosis not present

## 2019-08-30 DIAGNOSIS — M48061 Spinal stenosis, lumbar region without neurogenic claudication: Secondary | ICD-10-CM

## 2019-08-30 DIAGNOSIS — J9601 Acute respiratory failure with hypoxia: Secondary | ICD-10-CM | POA: Diagnosis not present

## 2019-08-30 LAB — CBC WITH DIFFERENTIAL/PLATELET
Abs Immature Granulocytes: 0.03 10*3/uL (ref 0.00–0.07)
Basophils Absolute: 0 10*3/uL (ref 0.0–0.1)
Basophils Relative: 0 %
Eosinophils Absolute: 0 10*3/uL (ref 0.0–0.5)
Eosinophils Relative: 0 %
HCT: 42.4 % (ref 36.0–46.0)
Hemoglobin: 13.6 g/dL (ref 12.0–15.0)
Immature Granulocytes: 1 %
Lymphocytes Relative: 26 %
Lymphs Abs: 0.8 10*3/uL (ref 0.7–4.0)
MCH: 28.6 pg (ref 26.0–34.0)
MCHC: 32.1 g/dL (ref 30.0–36.0)
MCV: 89.3 fL (ref 80.0–100.0)
Monocytes Absolute: 0.1 10*3/uL (ref 0.1–1.0)
Monocytes Relative: 3 %
Neutro Abs: 2.2 10*3/uL (ref 1.7–7.7)
Neutrophils Relative %: 70 %
Platelets: 264 10*3/uL (ref 150–400)
RBC: 4.75 MIL/uL (ref 3.87–5.11)
RDW: 14 % (ref 11.5–15.5)
WBC: 3.1 10*3/uL — ABNORMAL LOW (ref 4.0–10.5)
nRBC: 0 % (ref 0.0–0.2)

## 2019-08-30 LAB — D-DIMER, QUANTITATIVE: D-Dimer, Quant: 0.51 ug/mL-FEU — ABNORMAL HIGH (ref 0.00–0.50)

## 2019-08-30 LAB — COMPREHENSIVE METABOLIC PANEL
ALT: 33 U/L (ref 0–44)
AST: 25 U/L (ref 15–41)
Albumin: 3 g/dL — ABNORMAL LOW (ref 3.5–5.0)
Alkaline Phosphatase: 44 U/L (ref 38–126)
Anion gap: 9 (ref 5–15)
BUN: 21 mg/dL — ABNORMAL HIGH (ref 6–20)
CO2: 24 mmol/L (ref 22–32)
Calcium: 8.5 mg/dL — ABNORMAL LOW (ref 8.9–10.3)
Chloride: 109 mmol/L (ref 98–111)
Creatinine, Ser: 0.73 mg/dL (ref 0.44–1.00)
GFR calc Af Amer: >60 mL/min (ref >60)
GFR calc non Af Amer: >60 mL/min (ref >60)
Glucose, Bld: 167 mg/dL — ABNORMAL HIGH (ref 70–99)
Potassium: 4.2 mmol/L (ref 3.5–5.1)
Sodium: 142 mmol/L (ref 135–145)
Total Bilirubin: 0.2 mg/dL — ABNORMAL LOW (ref 0.3–1.2)
Total Protein: 6.6 g/dL (ref 6.5–8.1)

## 2019-08-30 LAB — FERRITIN: Ferritin: 753 ng/mL — ABNORMAL HIGH (ref 11–307)

## 2019-08-30 LAB — GLUCOSE, CAPILLARY
Glucose-Capillary: 158 mg/dL — ABNORMAL HIGH (ref 70–99)
Glucose-Capillary: 103 mg/dL — ABNORMAL HIGH (ref 70–99)

## 2019-08-30 LAB — C-REACTIVE PROTEIN: CRP: 4.6 mg/dL — ABNORMAL HIGH (ref <1.0)

## 2019-08-30 MED ORDER — MOMETASONE FURO-FORMOTEROL FUM 100-5 MCG/ACT IN AERO
2.0000 | INHALATION_SPRAY | Freq: Two times a day (BID) | RESPIRATORY_TRACT | Status: DC
  Administered 2019-08-30 – 2019-09-05 (×12): 2 via RESPIRATORY_TRACT
  Filled 2019-08-30: qty 8.8

## 2019-08-30 MED ORDER — IPRATROPIUM-ALBUTEROL 20-100 MCG/ACT IN AERS
2.0000 | INHALATION_SPRAY | RESPIRATORY_TRACT | Status: DC
  Administered 2019-08-30 – 2019-09-02 (×12): 2 via RESPIRATORY_TRACT
  Filled 2019-08-30: qty 4

## 2019-08-30 NOTE — Progress Notes (Signed)
PROGRESS NOTE    [name]  QQP:[id number] DOB: 1976/03/15 DOA: 08/28/2019 PCP: Laurey Morale, MD    Brief Narrative:  Patient is a 44 year old female with history of obesity, chronic back pain from lumbar disc disease on chronic opioids, anxiety/depression, hypertension, GERD, presented to ED with trouble breathing.  Patient reported that her husband had tested positive for Covid last month.  Approximately 10 days ago she started noticing some mild symptoms such as cough and mild fevers.  In ED, patient was found to have O2 sats 87% on room air, placed on 2 L.  CRP 9.8, ferritin 1300, procalcitonin level mildly elevated at 0.52 but normal WBCs.   COVID-19 PCR positive   Assessment & Plan:   Principal Problem:   Acute respiratory failure with hypoxia (HCC) Active Problems:   Obesity (BMI 30-39.9)   ANXIETY DISORDER, GENERALIZED   Depression   Hereditary and idiopathic peripheral neuropathy   GERD (gastroesophageal reflux disease)   HTN (hypertension)   Spinal stenosis of lumbar region   DDD (degenerative disc disease), lumbar   Major depressive disorder with single episode, in partial remission (HCC)   Chronic pain   Acute respiratory disease due to COVID-19 virus  1 acute hypoxic respiratory failure secondary to acute COVID-19 viral pneumonia during ongoing COVID-19 pandemic, POA Patient had presented with shortness of breath, nausea fevers cough.  CT angiogram chest negative for PE however did show diffuse bilateral groundglass airspace opacities.  Patient noted to be hypoxic with ongoing requirement of 2 L nasal cannula.  Patient with some complaints of shortness of breath today states does not feel as well as she did yesterday.  Continue IV Decadron, remdesivir day #3/5.  Patient status post Actemra 08/28/2019.  Continue to wean O2, vitamin C, zinc, albuterol, Tylenol.  Ambulatory O2 sats.  Continue Flonase.  Placed on Dulera, Combivent inhalers.  Supportive care.  2.   Nausea/history of GERD Likely exacerbated by Covid.  Nausea improving.  IV fluids on hold.  IV antiemetics as needed.  PPI, Carafate.  3.  Anxiety/depression Ativan as needed.  4.  History of spinal stenosis of the lumbar region/DDD/chronic pain syndrome/peripheral neuropathy Continue current IV pain medications due to persistent nausea.  Once nausea has improved and tolerating oral intake would likely resume home dose of oral pain regimen.  Follow.  5.  Hypertension BP stable.  Likely resume home regimen HCTZ in the next 1 to 2 days.  6.  Obesity   DVT prophylaxis: Lovenox Code Status: Full Family Communication: Updated patient.  No family at bedside. Disposition Plan:  . Patient came from: Home           . Anticipated d/c place: Home . Barriers to d/c OR conditions which need to be met to effect a safe d/c: Home once improved clinically, improvement in hypoxia, tolerating diet, completion of IV remdesivir.   Consultants:   None  Procedures:   CT angiogram chest 08/28/2019  Chest x-ray 08/28/2019    Antimicrobials:   None   Subjective: Patient states breathing a little bit worse today and not feeling as well as she did yesterday morning.  Patient with some chest discomfort that she attributes to coughing.  Complaining of her chronic back pain awaiting some pain medication.  Currently sats of 94% on 2 L nasal cannula.  Objective: Vitals:   08/30/19 0411 08/30/19 0536 08/30/19 0841 08/30/19 0857  BP:  110/78 127/81   Pulse:  (!) 57 (!) 54 (!) 56  Resp:  20 20    Temp: 97.9 F (36.6 C) (!) 97.2 F (36.2 C)    TempSrc: Oral Oral    SpO2:  94% 94%   Weight:      Height:        Intake/Output Summary (Last 24 hours) at 08/30/2019 1310 Last data filed at 08/30/2019 1128 Gross per 24 hour  Intake 340 ml  Output 250 ml  Net 90 ml   Filed Weights   08/28/19 1244 08/28/19 1731  Weight: 123.8 kg 119.8 kg    Examination:  General exam: Appears calm and  comfortable  Respiratory system: Decreased breath sounds in the bases.  Normal respiratory effort.   Cardiovascular system: S1 & S2 heard, RRR. No JVD, murmurs, rubs, gallops or clicks. No pedal edema. Gastrointestinal system: Abdomen is nondistended, soft and nontender. No organomegaly or masses felt. Normal bowel sounds heard. Central nervous system: Alert and oriented. No focal neurological deficits. Extremities: Symmetric 5 x 5 power. Skin: No rashes, lesions or ulcers Psychiatry: Judgement and insight appear normal. Mood & affect appropriate.     Data Reviewed: I have personally reviewed following labs and imaging studies  CBC: Recent Labs  Lab 08/28/19 1300 08/29/19 0420 08/30/19 0334  WBC 6.1 2.2* 3.1*  NEUTROABS 4.8 1.3* 2.2  HGB 15.3* 13.9 13.6  HCT 45.5 42.7 42.4  MCV 87.0 88.6 89.3  PLT 193 194 245   Basic Metabolic Panel: Recent Labs  Lab 08/28/19 1300 08/29/19 0420 08/30/19 0334  NA 138 140 142  K 3.6 4.3 4.2  CL 98 106 109  CO2 '26 23 24  '$ GLUCOSE 151* 157* 167*  BUN 15 13 21*  CREATININE 0.86 0.65 0.73  CALCIUM 8.2* 8.5* 8.5*   GFR: Estimated Creatinine Clearance: 122.8 mL/min (by C-G formula based on SCr of 0.73 mg/dL). Liver Function Tests: Recent Labs  Lab 08/28/19 1300 08/29/19 0420 08/30/19 0334  AST 59* 37 25  ALT 55* 40 33  ALKPHOS 56 47 44  BILITOT 0.3 0.5 0.2*  PROT 7.2 6.8 6.6  ALBUMIN 3.4* 3.1* 3.0*   No results for input(s): LIPASE, AMYLASE in the last 168 hours. No results for input(s): AMMONIA in the last 168 hours. Coagulation Profile: No results for input(s): INR, PROTIME in the last 168 hours. Cardiac Enzymes: No results for input(s): CKTOTAL, CKMB, CKMBINDEX, TROPONINI in the last 168 hours. BNP (last 3 results) No results for input(s): PROBNP in the last 8760 hours. HbA1C: No results for input(s): HGBA1C in the last 72 hours. CBG: Recent Labs  Lab 08/29/19 0812 08/29/19 1128 08/29/19 1623 08/29/19 2117  08/30/19 1127  GLUCAP 111* 111* 106* 128* 158*   Lipid Profile: Recent Labs    08/28/19 1300  TRIG 275*   Thyroid Function Tests: No results for input(s): TSH, T4TOTAL, FREET4, T3FREE, THYROIDAB in the last 72 hours. Anemia Panel: Recent Labs    08/29/19 0420 08/30/19 0334  FERRITIN 832* 753*   Sepsis Labs: Recent Labs  Lab 08/28/19 1300 08/28/19 1318 08/28/19 1525 08/29/19 0420  PROCALCITON  --  0.52  --  0.29  LATICACIDVEN 2.0*  --  1.5  --     Recent Results (from the past 240 hour(s))  Blood Culture (routine x 2)     Status: None (Preliminary result)   Collection Time: 08/28/19  1:17 PM   Specimen: BLOOD RIGHT HAND  Result Value Ref Range Status   Specimen Description   Final    BLOOD RIGHT HAND Performed at [facility]  [facility], [place] [address], [place], [place] [address]    Special Requests   Final    BOTTLES DRAWN AEROBIC ONLY Blood Culture results may not be optimal due to an inadequate volume of blood received in culture bottles Performed at [facility] [address] [place], [place] [address]    Culture   Final    NO GROWTH 2 DAYS Performed at [facility] [address] [place], [place] [address]    Report Status PENDING  Incomplete  Blood Culture (routine x 2)     Status: None (Preliminary result)   Collection Time: 08/28/19  1:18 PM   Specimen: BLOOD  Result Value Ref Range Status   Specimen Description   Final    BLOOD RIGHT ANTECUBITAL Performed at [facility] [address], [place], [place] [address]    Special Requests   Final    BOTTLES DRAWN AEROBIC AND ANAEROBIC Blood Culture adequate volume Performed at [facility] [address] [place], [place] [address]    Culture   Final    NO GROWTH 2 DAYS Performed at [facility] [address], [place], [place] [address]    Report Status PENDING  Incomplete  Respiratory Panel by RT PCR (Flu A&B,  Covid) - Nasopharyngeal Swab     Status: Abnormal   Collection Time: 08/28/19  3:06 PM   Specimen: Nasopharyngeal Swab  Result Value Ref Range Status   SARS Coronavirus 2 by RT PCR POSITIVE (A) NEGATIVE Final    Comment: RESULT CALLED TO, READ BACK BY AND VERIFIED WITH: PONDS,J. RN '@[id number]'$  ON 03.29.2021 BY COHEN,K (NOTE) SARS-CoV-2 target nucleic acids are DETECTED. SARS-CoV-2 RNA is generally detectable in upper respiratory specimens  during the acute phase of infection. Positive results are indicative of the presence of the identified virus, but do not rule out bacterial infection or co-infection with other pathogens not detected by the test. Clinical correlation with patient history and other diagnostic information is necessary to determine patient infection status. The expected result is Negative. Fact Sheet for Patients:  PinkCheek.be Fact Sheet for Healthcare Providers: GravelBags.it This test is not yet approved or cleared by the [place] FDA and  has been authorized for detection and/or diagnosis of SARS-CoV-2 by FDA under an Emergency Use Authorization (EUA).  This EUA will remain in effect (meaning this test can be  used) for the duration of  the COVID-19 declaration under Section 564(b)(1) of the Act, 21 U.S.C. section 360bbb-3(b)(1), unless the authorization is terminated or revoked sooner.    Influenza A by PCR NEGATIVE NEGATIVE Final   Influenza B by PCR NEGATIVE NEGATIVE Final    Comment: (NOTE) The Xpert Xpress SARS-CoV-2/FLU/RSV assay is intended as an aid in  the diagnosis of influenza from Nasopharyngeal swab specimens and  should not be used as a sole basis for treatment. Nasal washings and  aspirates are unacceptable for Xpert Xpress SARS-CoV-2/FLU/RSV  testing. Fact Sheet for Patients: PinkCheek.be Fact Sheet for Healthcare  Providers: GravelBags.it This test is not yet approved or cleared by the [place] FDA and  has been authorized for detection and/or diagnosis of SARS-CoV-2 by  FDA under an Emergency Use Authorization (EUA). This EUA will remain  in effect (meaning this test can be used) for the duration of the  Covid-19 declaration under Section 564(b)(1) of the Act, 21  U.S.C. section 360bbb-3(b)(1), unless the authorization is  terminated or revoked. Performed at [facility], [place] [address], [place], [place] [address]          Radiology Studies: CT ANGIO CHEST PE W OR WO CONTRAST  Result Date: 08/28/2019 CLINICAL DATA:  Respiratory failure.  COVID-19 positive. EXAM: CT ANGIOGRAPHY CHEST WITH CONTRAST TECHNIQUE: Multidetector CT imaging of the chest was performed using the standard protocol during bolus administration of intravenous contrast. Multiplanar CT image reconstructions and MIPs were obtained to evaluate the vascular anatomy. CONTRAST:  185m OMNIPAQUE IOHEXOL 350 MG/ML SOLN COMPARISON:  June 17, 2018. FINDINGS: Cardiovascular: Evaluation is limited by respiratory motion artifact.Given this limitation, no acute pulmonary embolism was detected. The main pulmonary artery is within normal limits for size. There is no CT evidence of acute right heart strain. The visualized aorta is normal. Heart size is normal, without pericardial effusion. Mediastinum/Nodes: --No mediastinal or hilar lymphadenopathy. --No axillary lymphadenopathy. --No supraclavicular lymphadenopathy. --Normal thyroid gland. --The esophagus is unremarkable Lungs/Pleura: Diffuse bilateral ground-glass airspace opacities are noted, consistent with the patient's history of viral pneumonia. There is no pneumothorax. No large pleural effusion. The trachea is unremarkable. Upper Abdomen: There is diffuse hepatic steatosis. Musculoskeletal: No chest wall abnormality. No acute or  significant osseous findings. Review of the MIP images confirms the above findings. IMPRESSION: 1. Evaluation for pulmonary emboli is limited by respiratory motion artifact. Given this limitation, no acute pulmonary embolism was detected. 2. Diffuse bilateral ground-glass airspace opacities are noted, consistent with the patient's history of viral pneumonia. 3. Hepatic steatosis. Electronically Signed   By: CConstance HolsterM.D.   On: 08/28/2019 23:30   DG Chest Port 1 View  Result Date: 08/28/2019 CLINICAL DATA:  Increased shortness of breath with oxygen desaturation. Recent COVID-19 infection. EXAM: PORTABLE CHEST 1 VIEW COMPARISON:  Radiographs 06/21/2018 and 06/17/2018. CT 06/17/2018. FINDINGS: 1345 hours. There are lower lung volumes with asymmetric elevation of the right hemidiaphragm. The heart size and mediastinal contours are stable and within normal limits for body habitus. There are increased right greater than left perihilar opacities which may reflect atelectasis or atypical inflammation. No consolidation, pleural effusion or pneumothorax. The bones appear unchanged. IMPRESSION: Worsening right greater than left perihilar opacities consistent with worsening atelectasis or atypical inflammation such as viral pneumonia. Electronically Signed   By: WRichardean SaleM.D.   On: 08/28/2019 14:01        Scheduled Meds: . vitamin C  500 mg Oral Daily  . dexamethasone (DECADRON) injection  6 mg Intravenous Q24H  . enoxaparin (LOVENOX) injection  60 mg Subcutaneous Q24H  . feeding supplement (ENSURE ENLIVE)  237 mL Oral BID BM  . fluticasone  2 spray Each Nare QPM  . gabapentin  300 mg Oral TID  . insulin aspart  0-9 Units Subcutaneous TID WC  . loratadine  10 mg Oral Daily  . pantoprazole  40 mg Oral QAC breakfast  . sodium chloride flush  3 mL Intravenous Once  . sucralfate  1 g Oral TID WC & HS  . zinc sulfate  220 mg Oral Daily   Continuous Infusions: . remdesivir 100 mg in NS 100  mL 100 mg (08/30/19 0902)     LOS: 2 days    Time spent: 35 minutes    DIrine Seal MD [facility]   To contact the attending provider between 7A-7P or the covering provider during after hours 7P-7A, please log into the web site [email or web address] and access using universal Ruby password for that web site. If you do not have the password, please call  the hospital operator.  08/30/2019, 1:10 PM

## 2019-08-30 NOTE — Plan of Care (Signed)
  Problem: Education: Goal: Knowledge of risk factors and measures for prevention of condition will improve Outcome: Progressing   

## 2019-08-31 DIAGNOSIS — M5136 Other intervertebral disc degeneration, lumbar region: Secondary | ICD-10-CM | POA: Diagnosis not present

## 2019-08-31 DIAGNOSIS — U071 COVID-19: Secondary | ICD-10-CM | POA: Diagnosis not present

## 2019-08-31 DIAGNOSIS — F324 Major depressive disorder, single episode, in partial remission: Secondary | ICD-10-CM | POA: Diagnosis not present

## 2019-08-31 DIAGNOSIS — G894 Chronic pain syndrome: Secondary | ICD-10-CM | POA: Diagnosis not present

## 2019-08-31 DIAGNOSIS — J9601 Acute respiratory failure with hypoxia: Secondary | ICD-10-CM | POA: Diagnosis not present

## 2019-08-31 LAB — COMPREHENSIVE METABOLIC PANEL
ALT: 25 U/L (ref 0–44)
AST: 22 U/L (ref 15–41)
Albumin: 2.9 g/dL — ABNORMAL LOW (ref 3.5–5.0)
Alkaline Phosphatase: 44 U/L (ref 38–126)
Anion gap: 11 (ref 5–15)
BUN: 26 mg/dL — ABNORMAL HIGH (ref 6–20)
CO2: 23 mmol/L (ref 22–32)
Calcium: 8.4 mg/dL — ABNORMAL LOW (ref 8.9–10.3)
Chloride: 108 mmol/L (ref 98–111)
Creatinine, Ser: 0.8 mg/dL (ref 0.44–1.00)
GFR calc Af Amer: >60 mL/min (ref >60)
GFR calc non Af Amer: >60 mL/min (ref >60)
Glucose, Bld: 160 mg/dL — ABNORMAL HIGH (ref 70–99)
Potassium: 3.8 mmol/L (ref 3.5–5.1)
Sodium: 142 mmol/L (ref 135–145)
Total Bilirubin: 0.3 mg/dL (ref 0.3–1.2)
Total Protein: 6 g/dL — ABNORMAL LOW (ref 6.5–8.1)

## 2019-08-31 LAB — CBC WITH DIFFERENTIAL/PLATELET
Abs Immature Granulocytes: 0.08 10*3/uL — ABNORMAL HIGH (ref 0.00–0.07)
Basophils Absolute: 0 10*3/uL (ref 0.0–0.1)
Basophils Relative: 0 %
Eosinophils Absolute: 0 10*3/uL (ref 0.0–0.5)
Eosinophils Relative: 0 %
HCT: 40.4 % (ref 36.0–46.0)
Hemoglobin: 13 g/dL (ref 12.0–15.0)
Immature Granulocytes: 1 %
Lymphocytes Relative: 21 %
Lymphs Abs: 1.2 10*3/uL (ref 0.7–4.0)
MCH: 28.9 pg (ref 26.0–34.0)
MCHC: 32.2 g/dL (ref 30.0–36.0)
MCV: 89.8 fL (ref 80.0–100.0)
Monocytes Absolute: 0.4 10*3/uL (ref 0.1–1.0)
Monocytes Relative: 8 %
Neutro Abs: 3.9 10*3/uL (ref 1.7–7.7)
Neutrophils Relative %: 70 %
Platelets: 313 10*3/uL (ref 150–400)
RBC: 4.5 MIL/uL (ref 3.87–5.11)
RDW: 13.8 % (ref 11.5–15.5)
WBC: 5.6 10*3/uL (ref 4.0–10.5)
nRBC: 0 % (ref 0.0–0.2)

## 2019-08-31 LAB — GLUCOSE, CAPILLARY
Glucose-Capillary: 116 mg/dL — ABNORMAL HIGH (ref 70–99)
Glucose-Capillary: 158 mg/dL — ABNORMAL HIGH (ref 70–99)
Glucose-Capillary: 109 mg/dL — ABNORMAL HIGH (ref 70–99)
Glucose-Capillary: 125 mg/dL — ABNORMAL HIGH (ref 70–99)

## 2019-08-31 LAB — FERRITIN: Ferritin: 543 ng/mL — ABNORMAL HIGH (ref 11–307)

## 2019-08-31 LAB — C-REACTIVE PROTEIN: CRP: 2.2 mg/dL — ABNORMAL HIGH (ref <1.0)

## 2019-08-31 LAB — BRAIN NATRIURETIC PEPTIDE: B Natriuretic Peptide: 174.2 pg/mL — ABNORMAL HIGH (ref 0.0–100.0)

## 2019-08-31 LAB — D-DIMER, QUANTITATIVE: D-Dimer, Quant: 0.35 ug/mL-FEU (ref 0.00–0.50)

## 2019-08-31 MED ORDER — BENZONATATE 100 MG PO CAPS
200.0000 mg | ORAL_CAPSULE | Freq: Three times a day (TID) | ORAL | Status: DC
  Administered 2019-08-31 – 2019-09-05 (×16): 200 mg via ORAL
  Filled 2019-08-31 (×16): qty 2

## 2019-08-31 MED ORDER — BENGAY GREASELESS 10-15 % EX CREA
1.0000 "application " | TOPICAL_CREAM | CUTANEOUS | Status: DC | PRN
  Filled 2019-08-31: qty 57

## 2019-08-31 MED ORDER — MUSCLE RUB 10-15 % EX CREA
TOPICAL_CREAM | CUTANEOUS | Status: DC | PRN
  Filled 2019-08-31: qty 85

## 2019-08-31 MED ORDER — FUROSEMIDE 10 MG/ML IJ SOLN
40.0000 mg | Freq: Every day | INTRAMUSCULAR | Status: AC
  Administered 2019-08-31 – 2019-09-02 (×3): 40 mg via INTRAVENOUS
  Filled 2019-08-31 (×3): qty 4

## 2019-08-31 NOTE — Progress Notes (Signed)
PROGRESS NOTE    [name]  RXV:[id number] DOB: 03-04-1976 DOA: 08/28/2019 PCP: Laurey Morale, MD    Brief Narrative:  Patient is a 44 year old female with history of obesity, chronic back pain from lumbar disc disease on chronic opioids, anxiety/depression, hypertension, GERD, presented to ED with trouble breathing.  Patient reported that her husband had tested positive for Covid last month.  Approximately 10 days ago she started noticing some mild symptoms such as cough and mild fevers.  In ED, patient was found to have O2 sats 87% on room air, placed on 2 L.  CRP 9.8, ferritin 1300, procalcitonin level mildly elevated at 0.52 but normal WBCs.   COVID-19 PCR positive   Assessment & Plan:   Principal Problem:   Acute respiratory failure with hypoxia (HCC) Active Problems:   Obesity (BMI 30-39.9)   ANXIETY DISORDER, GENERALIZED   Depression   Hereditary and idiopathic peripheral neuropathy   GERD (gastroesophageal reflux disease)   HTN (hypertension)   Spinal stenosis of lumbar region   DDD (degenerative disc disease), lumbar   Major depressive disorder with single episode, in partial remission (HCC)   Chronic pain   Acute respiratory disease due to COVID-19 virus  1 acute hypoxic respiratory failure secondary to acute COVID-19 viral pneumonia during ongoing COVID-19 pandemic, POA Patient had presented with shortness of breath, nausea fevers cough.  CT angiogram chest negative for PE however did show diffuse bilateral groundglass airspace opacities.  Patient noted to be hypoxic with ongoing requirement of 2 L nasal cannula.  Patient with significant shortness of breath on minimal exertion per patient and RN. Inflammatory markers trending down. Continue IV Decadron, remdesivir day #4/5.  Patient status post Actemra 08/28/2019.  Continue to wean O2, vitamin C, zinc, albuterol, Tylenol.  Ambulatory O2 sats.  Continue Flonase, Dulera, Combivent inhalers. Will place on Lasix 40 mg IV  daily. Check a BNP. Strict I's and O's. Daily weights. Supportive care.  2.  Nausea/history of GERD Likely exacerbated by Covid.  Nausea improving.  IV fluids on hold.  IV antiemetics as needed. Continue PPI, Carafate.  3.  Anxiety/depression Ativan as needed.  4.  History of spinal stenosis of the lumbar region/DDD/chronic pain syndrome/peripheral neuropathy Continue current IV pain medications due to persistent nausea.  Once nausea has improved and tolerating oral intake would likely resume home dose of oral pain regimen.  Follow.  5.  Hypertension BP stable. Patient with shortness of breath on minimal exertion onset will place on IV Lasix.   6.  Obesity   DVT prophylaxis: Lovenox Code Status: Full Family Communication: Updated patient.  No family at bedside. Disposition Plan:  . Patient came from:Home           . Anticipated d/c place: Home . Barriers to d/c OR conditions which need to be met to effect a safe d/c: Home once improved clinically, improvement in hypoxia, tolerating diet, completion of IV remdesivir.   Consultants:   None  Procedures:   CT angiogram chest 08/28/2019  Chest x-ray 08/28/2019    Antimicrobials:   None   Subjective: Patient states when laying at rest feels okay however has significant shortness of breath on minimal exertion. RN also stating that patient on minimal exertion significantly short of breath. Patient denies any nausea or vomiting. States has a chronic dysphagia that is being followed up in the outpatient setting. Currently tolerating diet.   Objective: Vitals:   08/30/19 1300 08/30/19 2012 08/31/19 0556 08/31/19 0807  BP: 124/80  132/81 121/75   Pulse: 60 60 (!) 51   Resp: (!) 22     Temp: 97.9 F (36.6 C) 98.1 F (36.7 C) 97.9 F (36.6 C)   TempSrc: Oral Oral    SpO2: 96%  97% 92%  Weight:      Height:        Intake/Output Summary (Last 24 hours) at 08/31/2019 1237 Last data filed at 08/31/2019 0945 Gross per 24 hour    Intake 120 ml  Output --  Net 120 ml   Filed Weights   08/28/19 1244 08/28/19 1731  Weight: 123.8 kg 119.8 kg    Examination:  General exam: Appears calm and comfortable  Respiratory system: Decreased breath sounds in the bases. Scattered crackles. Poor air movement. Cardiovascular system: Regular rate rhythm no murmurs rubs or gallops. No JVD. No lower extremity edema. Gastrointestinal system: Abdomen is soft, nontender, nondistended, positive bowel sounds. No rebound. No guarding.  Central nervous system: Alert and oriented. No focal neurological deficits. Extremities: Symmetric 5 x 5 power. Skin: No rashes, lesions or ulcers Psychiatry: Judgement and insight appear normal. Mood & affect appropriate.     Data Reviewed: I have personally reviewed following labs and imaging studies  CBC: Recent Labs  Lab 08/28/19 1300 08/29/19 0420 08/30/19 0334 08/31/19 0328  WBC 6.1 2.2* 3.1* 5.6  NEUTROABS 4.8 1.3* 2.2 3.9  HGB 15.3* 13.9 13.6 13.0  HCT 45.5 42.7 42.4 40.4  MCV 87.0 88.6 89.3 89.8  PLT 193 194 264 124   Basic Metabolic Panel: Recent Labs  Lab 08/28/19 1300 08/29/19 0420 08/30/19 0334 08/31/19 0328  NA 138 140 142 142  K 3.6 4.3 4.2 3.8  CL 98 106 109 108  CO2 _0 GLUCOSE 151* 157* 167* 160*  BUN 15 13 21* 26*  CREATININE 0.86 0.65 0.73 0.80  CALCIUM 8.2* 8.5* 8.5* 8.4*   GFR: Estimated Creatinine Clearance: 122.8 mL/min (by C-G formula based on SCr of 0.8 mg/dL). Liver Function Tests: Recent Labs  Lab 08/28/19 1300 08/29/19 0420 08/30/19 0334 08/31/19 0328  AST 59* 37 25 22  ALT 55* 40 33 25  ALKPHOS 56 47 44 44  BILITOT 0.3 0.5 0.2* 0.3  PROT 7.2 6.8 6.6 6.0*  ALBUMIN 3.4* 3.1* 3.0* 2.9*   No results for input(s): LIPASE, AMYLASE in the last 168 hours. No results for input(s): AMMONIA in the last 168 hours. Coagulation Profile: No results for input(s): INR, PROTIME in the last 168 hours. Cardiac Enzymes: No results for  input(s): CKTOTAL, CKMB, CKMBINDEX, TROPONINI in the last 168 hours. BNP (last 3 results) No results for input(s): PROBNP in the last 8760 hours. HbA1C: No results for input(s): HGBA1C in the last 72 hours. CBG: Recent Labs  Lab 08/29/19 2117 08/30/19 1127 08/30/19 2002 08/31/19 0743 08/31/19 1138  GLUCAP 128* 158* 103* 116* 158*   Lipid Profile: Recent Labs    08/28/19 1300  TRIG 275*   Thyroid Function Tests: No results for input(s): TSH, T4TOTAL, FREET4, T3FREE, THYROIDAB in the last 72 hours. Anemia Panel: Recent Labs    08/30/19 0334 08/31/19 0328  FERRITIN 753* 543*   Sepsis Labs: Recent Labs  Lab 08/28/19 1300 08/28/19 1318 08/28/19 1525 08/29/19 0420  PROCALCITON  --  0.52  --  0.29  LATICACIDVEN 2.0*  --  1.5  --     Recent Results (from the past 240 hour(s))  Blood Culture (routine x 2)     Status: None (Preliminary result)  Collection Time: 08/28/19  1:17 PM   Specimen: BLOOD RIGHT HAND  Result Value Ref Range Status   Specimen Description   Final    BLOOD RIGHT HAND Performed at [facility] [address] [place], [place] [address]    Special Requests   Final    BOTTLES DRAWN AEROBIC ONLY Blood Culture results may not be optimal due to an inadequate volume of blood received in culture bottles Performed at [facility] [address], [place], [place] [address]    Culture   Final    NO GROWTH 3 DAYS Performed at [facility], [place] [address], [place], [place] [address]    Report Status PENDING  Incomplete  Blood Culture (routine x 2)     Status: None (Preliminary result)   Collection Time: 08/28/19  1:18 PM   Specimen: BLOOD  Result Value Ref Range Status   Specimen Description   Final    BLOOD RIGHT ANTECUBITAL Performed at [facility] [address] [place], [place] [address]    Special Requests   Final    BOTTLES DRAWN AEROBIC AND ANAEROBIC Blood Culture adequate  volume Performed at [facility] [address] [place], [place] [address]    Culture   Final    NO GROWTH 3 DAYS Performed at [facility], [place] [address], [place], [place] [address]    Report Status PENDING  Incomplete  Respiratory Panel by RT PCR (Flu A&B, Covid) - Nasopharyngeal Swab     Status: Abnormal   Collection Time: 08/28/19  3:06 PM   Specimen: Nasopharyngeal Swab  Result Value Ref Range Status   SARS Coronavirus 2 by RT PCR POSITIVE (A) NEGATIVE Final    Comment: RESULT CALLED TO, READ BACK BY AND VERIFIED WITH: PONDS,J. RN _0  ON 03.29.2021 BY COHEN,K (NOTE) SARS-CoV-2 target nucleic acids are DETECTED. SARS-CoV-2 RNA is generally detectable in upper respiratory specimens  during the acute phase of infection. Positive results are indicative of the presence of the identified virus, but do not rule out bacterial infection or co-infection with other pathogens not detected by the test. Clinical correlation with patient history and other diagnostic information is necessary to determine patient infection status. The expected result is Negative. Fact Sheet for Patients:  PinkCheek.be Fact Sheet for Healthcare Providers: GravelBags.it This test is not yet approved or cleared by the [place] FDA and  has been authorized for detection and/or diagnosis of SARS-CoV-2 by FDA under an Emergency Use Authorization (EUA).  This EUA will remain in effect (meaning this test can be  used) for the duration of  the COVID-19 declaration under Section 564(b)(1) of the Act, 21 U.S.C. section 360bbb-3(b)(1), unless the authorization is terminated or revoked sooner.    Influenza A by PCR NEGATIVE NEGATIVE Final   Influenza B by PCR NEGATIVE NEGATIVE Final    Comment: (NOTE) The Xpert Xpress SARS-CoV-2/FLU/RSV assay is intended as an aid in  the diagnosis of influenza from Nasopharyngeal swab  specimens and  should not be used as a sole basis for treatment. Nasal washings and  aspirates are unacceptable for Xpert Xpress SARS-CoV-2/FLU/RSV  testing. Fact Sheet for Patients: PinkCheek.be Fact Sheet for Healthcare Providers: GravelBags.it This test is not yet approved or cleared by the [place] FDA and  has been authorized for detection and/or diagnosis of SARS-CoV-2 by  FDA under an Emergency Use Authorization (EUA). This EUA will remain  in effect (meaning this  test can be used) for the duration of the  Covid-19 declaration under Section 564(b)(1) of the Act, 21  U.S.C. section 360bbb-3(b)(1), unless the authorization is  terminated or revoked. Performed at [facility], [place] [address], [place], [place] [address]          Radiology Studies: No results found.      Scheduled Meds: . vitamin C  500 mg Oral Daily  . dexamethasone (DECADRON) injection  6 mg Intravenous Q24H  . enoxaparin (LOVENOX) injection  60 mg Subcutaneous Q24H  . feeding supplement (ENSURE ENLIVE)  237 mL Oral BID BM  . fluticasone  2 spray Each Nare QPM  . furosemide  40 mg Intravenous Daily  . gabapentin  300 mg Oral TID  . insulin aspart  0-9 Units Subcutaneous TID WC  . Ipratropium-Albuterol  2 puff Inhalation Q4H  . loratadine  10 mg Oral Daily  . mometasone-formoterol  2 puff Inhalation BID  . pantoprazole  40 mg Oral QAC breakfast  . sodium chloride flush  3 mL Intravenous Once  . sucralfate  1 g Oral TID WC & HS  . zinc sulfate  220 mg Oral Daily   Continuous Infusions: . remdesivir 100 mg in NS 100 mL 100 mg (08/31/19 0959)     LOS: 3 days    Time spent: 35 minutes    Irine Seal, MD [facility]   To contact the attending provider between 7A-7P or the covering provider during after hours 7P-7A, please log into the web site [email or web address] and access using universal [place]  password for that web site. If you do not have the password, please call the hospital operator.  08/31/2019, 12:37 PM

## 2019-08-31 NOTE — TOC Progression Note (Signed)
Transition of Care [facility]) - Progression Note    Patient Details  Name: [name] MRN: [id number] Date of Birth: [birth year]  Transition of Care [facility]) CM/SW Contact  Geni Bers, RN Phone Number: 08/31/2019, 3:44 PM  Clinical Narrative:    TOC will continue to follow for discharge needs.   Expected Discharge Plan: Home/Self Care Barriers to Discharge: No Barriers Identified  Expected Discharge Plan and Services Expected Discharge Plan: Home/Self Care       Living arrangements for the past 2 months: Single Family Home                                       Social Determinants of Health (SDOH) Interventions    Readmission Risk Interventions No flowsheet data found.

## 2019-08-31 NOTE — Plan of Care (Signed)
Patient continues to have dyspnea on exertion, some dry cough.  Was able to diurese over 2 liters urine after receiving 40 mg of IV lasix.  Oxygen saturation in the low to mid 90's on 2 liters.

## 2019-09-01 LAB — COMPREHENSIVE METABOLIC PANEL
ALT: 33 U/L (ref 0–44)
AST: 29 U/L (ref 15–41)
Albumin: 3.2 g/dL — ABNORMAL LOW (ref 3.5–5.0)
Alkaline Phosphatase: 46 U/L (ref 38–126)
Anion gap: 13 (ref 5–15)
BUN: 21 mg/dL — ABNORMAL HIGH (ref 6–20)
CO2: 25 mmol/L (ref 22–32)
Calcium: 8.3 mg/dL — ABNORMAL LOW (ref 8.9–10.3)
Chloride: 106 mmol/L (ref 98–111)
Creatinine, Ser: 0.7 mg/dL (ref 0.44–1.00)
GFR calc Af Amer: >60 mL/min (ref >60)
GFR calc non Af Amer: >60 mL/min (ref >60)
Glucose, Bld: 142 mg/dL — ABNORMAL HIGH (ref 70–99)
Potassium: 3.7 mmol/L (ref 3.5–5.1)
Sodium: 144 mmol/L (ref 135–145)
Total Bilirubin: 0.3 mg/dL (ref 0.3–1.2)
Total Protein: 6.3 g/dL — ABNORMAL LOW (ref 6.5–8.1)

## 2019-09-01 LAB — CBC WITH DIFFERENTIAL/PLATELET
Abs Immature Granulocytes: 0.07 10*3/uL (ref 0.00–0.07)
Basophils Absolute: 0 10*3/uL (ref 0.0–0.1)
Basophils Relative: 0 %
Eosinophils Absolute: 0 10*3/uL (ref 0.0–0.5)
Eosinophils Relative: 0 %
HCT: 41.7 % (ref 36.0–46.0)
Hemoglobin: 13.8 g/dL (ref 12.0–15.0)
Immature Granulocytes: 1 %
Lymphocytes Relative: 20 %
Lymphs Abs: 1 10*3/uL (ref 0.7–4.0)
MCH: 29.2 pg (ref 26.0–34.0)
MCHC: 33.1 g/dL (ref 30.0–36.0)
MCV: 88.3 fL (ref 80.0–100.0)
Monocytes Absolute: 0.3 10*3/uL (ref 0.1–1.0)
Monocytes Relative: 6 %
Neutro Abs: 3.7 10*3/uL (ref 1.7–7.7)
Neutrophils Relative %: 73 %
Platelets: 364 10*3/uL (ref 150–400)
RBC: 4.72 MIL/uL (ref 3.87–5.11)
RDW: 13.6 % (ref 11.5–15.5)
WBC: 5.1 10*3/uL (ref 4.0–10.5)
nRBC: 0 % (ref 0.0–0.2)

## 2019-09-01 LAB — D-DIMER, QUANTITATIVE: D-Dimer, Quant: 0.33 ug/mL-FEU (ref 0.00–0.50)

## 2019-09-01 LAB — GLUCOSE, CAPILLARY
Glucose-Capillary: 108 mg/dL — ABNORMAL HIGH (ref 70–99)
Glucose-Capillary: 103 mg/dL — ABNORMAL HIGH (ref 70–99)
Glucose-Capillary: 122 mg/dL — ABNORMAL HIGH (ref 70–99)
Glucose-Capillary: 116 mg/dL — ABNORMAL HIGH (ref 70–99)

## 2019-09-01 LAB — BRAIN NATRIURETIC PEPTIDE: B Natriuretic Peptide: 122.6 pg/mL — ABNORMAL HIGH (ref 0.0–100.0)

## 2019-09-01 LAB — PHOSPHORUS: Phosphorus: 4.2 mg/dL (ref 2.5–4.6)

## 2019-09-01 LAB — C-REACTIVE PROTEIN: CRP: 1.5 mg/dL — ABNORMAL HIGH (ref <1.0)

## 2019-09-01 LAB — FERRITIN: Ferritin: 484 ng/mL — ABNORMAL HIGH (ref 11–307)

## 2019-09-01 LAB — MAGNESIUM: Magnesium: 2.2 mg/dL (ref 1.7–2.4)

## 2019-09-01 MED ORDER — CALCIUM CARBONATE ANTACID 500 MG PO CHEW: 1.0000 | CHEWABLE_TABLET | Freq: Two times a day (BID) | ORAL | Status: DC | PRN

## 2019-09-01 MED ORDER — ORAL CARE MOUTH RINSE
15.0000 mL | Freq: Two times a day (BID) | OROMUCOSAL | Status: DC
  Administered 2019-09-01 – 2019-09-05 (×8): 15 mL via OROMUCOSAL

## 2019-09-01 MED ORDER — SUMATRIPTAN SUCCINATE 50 MG PO TABS
100.0000 mg | ORAL_TABLET | ORAL | Status: DC | PRN
  Filled 2019-09-01: qty 2

## 2019-09-01 MED ORDER — OXYCODONE HCL 5 MG PO TABS
10.0000 mg | ORAL_TABLET | Freq: Four times a day (QID) | ORAL | Status: DC | PRN
  Administered 2019-09-02: 10 mg via ORAL
  Filled 2019-09-01: qty 2

## 2019-09-01 MED ORDER — FAMOTIDINE 20 MG PO TABS
40.0000 mg | ORAL_TABLET | Freq: Every day | ORAL | Status: DC
  Administered 2019-09-01 – 2019-09-04 (×4): 40 mg via ORAL
  Filled 2019-09-01 (×4): qty 2

## 2019-09-01 NOTE — Evaluation (Signed)
Physical Therapy Evaluation Patient Details Name: [name] MRN: [id number] DOB: 1975/09/29 Today's Date: 09/01/2019   History of Present Illness  44 y.o. female with medical history significant for obesity, fibromyalgia,  chronic back pain from lumbar disc disease, multiple back surgeries, PE,  on chronic opiates and anxiety/depression. pt admitted with cough, SOB, and mild fever, Covid +  Clinical Impression  Pt admitted with above diagnosis.  Pt exhibiting incr WOB on PT arrival to room, SpO2= 97% at that time on 2L. O2 turned off to check RA/mobility sats with [place] in place. Pt O2 sats remained in higher 90s on RA with mobility, decr to 91% at rest in bed after 1 minute. O2 left on at 1.5L on PT departure, pt should be able to wean to RA. Pt educated on breathing techniques/diaphragmatic breathing, reasoning regarding dyspnea and anxiety associated with SOB, using O2 sat monitor as a visual feedback tool.   Pt currently with functional limitations due to the deficits listed below (see PT Problem List). Pt will benefit from skilled PT to increase their independence and safety with mobility to allow discharge to the venue listed below.       Follow Up Recommendations Home health PT    Equipment Recommendations  None recommended by PT    Recommendations for Other Services       Precautions / Restrictions Precautions Precautions: Fall Precaution Comments: monitor O2 sats Restrictions Weight Bearing Restrictions: No      Mobility  Bed Mobility Overal bed mobility: Needs Assistance Bed Mobility: Sit to Supine       Sit to supine: Supervision   General bed mobility comments: incr time, for safety  Transfers Overall transfer level: Needs assistance Equipment used: None Transfers: Sit to/from Stand Sit to Stand: Min guard         General transfer comment: for safety  Ambulation/Gait Ambulation/Gait assistance: Min assist Gait Distance (Feet): 15 Feet Assistive  device: 1 person hand held assist Gait Pattern/deviations: Step-through pattern;Wide base of support;Decreased stride length Gait velocity: decr   General Gait Details: cues for posture, breathing techniques. SpO2=91-97% on RA (decr Sats at rest so incr O2 to 1.5L at rest)  Stairs            Wheelchair Mobility    Modified Rankin (Stroke Patients Only)       Balance Overall balance assessment: Needs assistance Sitting-balance support: No upper extremity supported;Feet supported Sitting balance-Leahy Scale: Good       Standing balance-Leahy Scale: Fair                               Pertinent Vitals/Pain Pain Assessment: No/denies pain(intermittent, chronic back discomfort)    Home Living Family/patient expects to be discharged to:: Private residence Living Arrangements: Spouse/significant other Available Help at Discharge: Family Type of Home: House Home Access: Stairs to enter Entrance Stairs-Rails: None Entrance Stairs-Number of Steps: 1 Home Layout: One level Home Equipment: Environmental consultant - 2 wheels;Walker - 4 wheels      Prior Function Level of Independence: Independent;Independent with assistive device(s)         Comments: uses rollator at times, reports limited activity level d/t back pain     Hand Dominance        Extremity/Trunk Assessment   Upper Extremity Assessment Upper Extremity Assessment: Overall WFL for tasks assessed    Lower Extremity Assessment Lower Extremity Assessment: Overall WFL for tasks assessed  Communication   Communication: No difficulties  Cognition Arousal/Alertness: Awake/alert Behavior During Therapy: WFL for tasks assessed/performed Overall Cognitive Status: Within Functional Limits for tasks assessed                                        General Comments      Exercises Other Exercises Other Exercises: postural exercises/trunk extension/diaphragmatic breathing Other  Exercises: ankle pumps Other Exercises: reviewed sit to stands, pt too fatigued to practice   Assessment/Plan    PT Assessment Patient needs continued PT services  PT Problem List Decreased mobility;Decreased balance;Decreased activity tolerance;Cardiopulmonary status limiting activity       PT Treatment Interventions DME instruction;Therapeutic exercise;Functional mobility training;Therapeutic activities;Patient/family education;Gait training    PT Goals (Current goals can be found in the Care Plan section)  Acute Rehab PT Goals Patient Stated Goal: to feel better PT Goal Formulation: With patient Time For Goal Achievement: 09/15/19 Potential to Achieve Goals: Good    Frequency Min 3X/week   Barriers to discharge        Co-evaluation               AM-PAC PT "6 Clicks" Mobility  Outcome Measure Help needed turning from your back to your side while in a flat bed without using bedrails?: None Help needed moving from lying on your back to sitting on the side of a flat bed without using bedrails?: None Help needed moving to and from a bed to a chair (including a wheelchair)?: A Little Help needed standing up from a chair using your arms (e.g., wheelchair or bedside chair)?: A Little Help needed to walk in hospital room?: A Little Help needed climbing 3-5 steps with a railing? : A Lot 6 Click Score: 19    End of Session   Activity Tolerance: Patient tolerated treatment well Patient left: with call bell/phone within reach   PT Visit Diagnosis: Difficulty in walking, not elsewhere classified (R26.2)    Time: 7169-6789 PT Time Calculation (min) (ACUTE ONLY): 24 min   Charges:   PT Evaluation $PT Eval Low Complexity: 1 Low PT Treatments $Gait Training: 8-22 mins        Baxter Flattery, PT   Acute Rehab Dept [facility]): [phone]   09/01/2019   [facility] 09/01/2019, 1:51 PM

## 2019-09-01 NOTE — Progress Notes (Signed)
PROGRESS NOTE    [name]  QMG:[id number] DOB: 1975/07/02 DOA: 08/28/2019 PCP: Laurey Morale, MD    Brief Narrative:  Patient is a 44 year old female with history of obesity, chronic back pain from lumbar disc disease on chronic opioids, anxiety/depression, hypertension, GERD, presented to ED with trouble breathing.  Patient reported that her husband had tested positive for Covid last month.  Approximately 10 days ago she started noticing some mild symptoms such as cough and mild fevers.  In ED, patient was found to have O2 sats 87% on room air, placed on 2 L.  CRP 9.8, ferritin 1300, procalcitonin level mildly elevated at 0.52 but normal WBCs.   COVID-19 PCR positive   Assessment & Plan:   Principal Problem:   Acute respiratory failure with hypoxia (HCC) Active Problems:   Obesity (BMI 30-39.9)   ANXIETY DISORDER, GENERALIZED   Depression   Hereditary and idiopathic peripheral neuropathy   GERD (gastroesophageal reflux disease)   HTN (hypertension)   Spinal stenosis of lumbar region   DDD (degenerative disc disease), lumbar   Major depressive disorder with single episode, in partial remission (HCC)   Chronic pain   Acute respiratory disease due to COVID-19 virus  1 acute hypoxic respiratory failure secondary to acute COVID-19 viral pneumonia during ongoing COVID-19 pandemic, POA Patient had presented with shortness of breath, nausea fevers cough.  CT angiogram chest negative for PE however did show diffuse bilateral groundglass airspace opacities.  Patient noted to be hypoxic with ongoing requirement of 2 L nasal cannula.  Patient with significant shortness of breath on minimal exertion per patient and RN on 08/31/2019.. Inflammatory markers trending down. Continue IV Decadron, remdesivir day #5/5.  Patient status post Actemra 08/28/2019.  Continue to wean O2, vitamin C, zinc, albuterol, Tylenol.  Ambulatory O2 sats.  Continue Flonase, Dulera, Combivent inhalers.  Patient placed  on Lasix '40mg'$  IV daily (08/31/2019) with urine output of 3.550 L over the past 24 hours.  Patient is -2.841 L during this hospitalization.  BNP elevated and trending down.  Continue IV Lasix.  Strict I's and O's.  Daily weights.  Supportive care.  2.  Nausea/history of GERD Likely exacerbated by Covid.  Improving clinically.  IV fluids have been saline lock.  Continue IV antiemetics as needed.  Continue Carafate and PPI.   3.  Anxiety/depression Ativan as needed.  4.  History of spinal stenosis of the lumbar region/DDD/chronic pain syndrome/peripheral neuropathy Continue current IV pain medications due to persistent nausea.  Nausea improved.  Patient currently on IV morphine as needed.  Will start oral oxycodone 10 mg every 6 hours as needed and slowly transition to oral pain regimen.  Follow.   5.  Hypertension BP stable.  Monitor on IV Lasix.    6.  Obesity   DVT prophylaxis: Lovenox Code Status: Full Family Communication: Updated patient.  No family at bedside. Disposition Plan:  . Patient came from:Home           . Anticipated d/c place: Home . Barriers to d/c OR conditions which need to be met to effect a safe d/c: Home once improved clinically, improvement in hypoxia, tolerating diet, completion of IV remdesivir.   Consultants:   None  Procedures:   CT angiogram chest 08/28/2019  Chest x-ray 08/28/2019    Antimicrobials:   None   Subjective: Patient sitting up in bed just finished working with physical therapy.  States improvement with shortness of breath on minimal exertion after diuresis if you she  is starting to slowly heading the right direction.  Tolerating current diet.  No nausea or emesis.  States chest tightness improving with diuresis.   Objective: Vitals:   08/31/19 2119 09/01/19 0405 09/01/19 0421 09/01/19 1122  BP: 113/68 (!) 143/93  94/62  Pulse: 64 (!) 51  97  Resp: '18 18  20  '$ Temp: 97.8 F (36.6 C) 97.8 F (36.6 C)  97.7 F (36.5 C)  TempSrc:  Oral Oral    SpO2: 94% 96%  95%  Weight:   119.7 kg   Height:        Intake/Output Summary (Last 24 hours) at 09/01/2019 1858 Last data filed at 09/01/2019 1800 Gross per 24 hour  Intake 1260 ml  Output 3150 ml  Net -1890 ml   Filed Weights   08/28/19 1731 08/31/19 1308 09/01/19 0421  Weight: 119.8 kg 120.9 kg 119.7 kg    Examination:  General exam: NAD. Respiratory system: Shallow breaths.  Poor air movement.  Some scattered crackles.  Cardiovascular system: RRR no murmurs rubs or gallops.  No JVD.  No lower extremity edema.  Gastrointestinal system: Abdomen is nontender, nondistended, soft, positive bowel sounds.  No rebound.  No guarding.  Central nervous system: Alert and oriented. No focal neurological deficits. Extremities: Symmetric 5 x 5 power. Skin: No rashes, lesions or ulcers Psychiatry: Judgement and insight appear normal. Mood & affect appropriate.     Data Reviewed: I have personally reviewed following labs and imaging studies  CBC: Recent Labs  Lab 08/28/19 1300 08/29/19 0420 08/30/19 0334 08/31/19 0328 09/01/19 0347  WBC 6.1 2.2* 3.1* 5.6 5.1  NEUTROABS 4.8 1.3* 2.2 3.9 3.7  HGB 15.3* 13.9 13.6 13.0 13.8  HCT 45.5 42.7 42.4 40.4 41.7  MCV 87.0 88.6 89.3 89.8 88.3  PLT 193 194 264 313 536   Basic Metabolic Panel: Recent Labs  Lab 08/28/19 1300 08/29/19 0420 08/30/19 0334 08/31/19 0328 09/01/19 0347  NA 138 140 142 142 144  K 3.6 4.3 4.2 3.8 3.7  CL 98 106 109 108 106  CO2 '26 23 24 23 25  '$ GLUCOSE 151* 157* 167* 160* 142*  BUN 15 13 21* 26* 21*  CREATININE 0.86 0.65 0.73 0.80 0.70  CALCIUM 8.2* 8.5* 8.5* 8.4* 8.3*  MG  --   --   --   --  2.2  PHOS  --   --   --   --  4.2   GFR: Estimated Creatinine Clearance: 122.8 mL/min (by C-G formula based on SCr of 0.7 mg/dL). Liver Function Tests: Recent Labs  Lab 08/28/19 1300 08/29/19 0420 08/30/19 0334 08/31/19 0328 09/01/19 0347  AST 59* 37 '25 22 29  '$ ALT 55* 40 33 25 33  ALKPHOS 56 47  44 44 46  BILITOT 0.3 0.5 0.2* 0.3 0.3  PROT 7.2 6.8 6.6 6.0* 6.3*  ALBUMIN 3.4* 3.1* 3.0* 2.9* 3.2*   No results for input(s): LIPASE, AMYLASE in the last 168 hours. No results for input(s): AMMONIA in the last 168 hours. Coagulation Profile: No results for input(s): INR, PROTIME in the last 168 hours. Cardiac Enzymes: No results for input(s): CKTOTAL, CKMB, CKMBINDEX, TROPONINI in the last 168 hours. BNP (last 3 results) No results for input(s): PROBNP in the last 8760 hours. HbA1C: No results for input(s): HGBA1C in the last 72 hours. CBG: Recent Labs  Lab 08/31/19 1649 08/31/19 2116 09/01/19 0807 09/01/19 1113 09/01/19 1653  GLUCAP 109* 125* 108* 103* 122*   Lipid Profile: No results for input(s):  CHOL, HDL, LDLCALC, TRIG, CHOLHDL, LDLDIRECT in the last 72 hours. Thyroid Function Tests: No results for input(s): TSH, T4TOTAL, FREET4, T3FREE, THYROIDAB in the last 72 hours. Anemia Panel: Recent Labs    08/31/19 0328 09/01/19 0347  FERRITIN 543* 484*   Sepsis Labs: Recent Labs  Lab 08/28/19 1300 08/28/19 1318 08/28/19 1525 08/29/19 0420  PROCALCITON  --  0.52  --  0.29  LATICACIDVEN 2.0*  --  1.5  --     Recent Results (from the past 240 hour(s))  Blood Culture (routine x 2)     Status: None (Preliminary result)   Collection Time: 08/28/19  1:17 PM   Specimen: BLOOD RIGHT HAND  Result Value Ref Range Status   Specimen Description   Final    BLOOD RIGHT HAND Performed at [facility], [place] [address], [place], [place] [address]    Special Requests   Final    BOTTLES DRAWN AEROBIC ONLY Blood Culture results may not be optimal due to an inadequate volume of blood received in culture bottles Performed at [facility] [address], [place], [place] [address]    Culture   Final    NO GROWTH 4 DAYS Performed at [facility], [place] [address] [place], [place] [address]    Report Status PENDING  Incomplete  Blood  Culture (routine x 2)     Status: None (Preliminary result)   Collection Time: 08/28/19  1:18 PM   Specimen: BLOOD  Result Value Ref Range Status   Specimen Description   Final    BLOOD RIGHT ANTECUBITAL Performed at [facility] [address], [place], [place] [address]    Special Requests   Final    BOTTLES DRAWN AEROBIC AND ANAEROBIC Blood Culture adequate volume Performed at [facility] [address], [place], [place] [address]    Culture   Final    NO GROWTH 4 DAYS Performed at [facility], [place] [address] [place], [place] [address]    Report Status PENDING  Incomplete  Respiratory Panel by RT PCR (Flu A&B, Covid) - Nasopharyngeal Swab     Status: Abnormal   Collection Time: 08/28/19  3:06 PM   Specimen: Nasopharyngeal Swab  Result Value Ref Range Status   SARS Coronavirus 2 by RT PCR POSITIVE (A) NEGATIVE Final    Comment: RESULT CALLED TO, READ BACK BY AND VERIFIED WITH: PONDS,J. RN '@[id number]'$  ON 03.29.2021 BY COHEN,K (NOTE) SARS-CoV-2 target nucleic acids are DETECTED. SARS-CoV-2 RNA is generally detectable in upper respiratory specimens  during the acute phase of infection. Positive results are indicative of the presence of the identified virus, but do not rule out bacterial infection or co-infection with other pathogens not detected by the test. Clinical correlation with patient history and other diagnostic information is necessary to determine patient infection status. The expected result is Negative. Fact Sheet for Patients:  PinkCheek.be Fact Sheet for Healthcare Providers: GravelBags.it This test is not yet approved or cleared by the [place] FDA and  has been authorized for detection and/or diagnosis of SARS-CoV-2 by FDA under an Emergency Use Authorization (EUA).  This EUA will remain in effect (meaning this test can be  used) for the duration of  the  COVID-19 declaration under Section 564(b)(1) of the Act, 21 U.S.C. section 360bbb-3(b)(1), unless the authorization is terminated or revoked sooner.    Influenza A by PCR NEGATIVE NEGATIVE Final   Influenza B by PCR NEGATIVE  NEGATIVE Final    Comment: (NOTE) The Xpert Xpress SARS-CoV-2/FLU/RSV assay is intended as an aid in  the diagnosis of influenza from Nasopharyngeal swab specimens and  should not be used as a sole basis for treatment. Nasal washings and  aspirates are unacceptable for Xpert Xpress SARS-CoV-2/FLU/RSV  testing. Fact Sheet for Patients: PinkCheek.be Fact Sheet for Healthcare Providers: GravelBags.it This test is not yet approved or cleared by the [place] FDA and  has been authorized for detection and/or diagnosis of SARS-CoV-2 by  FDA under an Emergency Use Authorization (EUA). This EUA will remain  in effect (meaning this test can be used) for the duration of the  Covid-19 declaration under Section 564(b)(1) of the Act, 21  U.S.C. section 360bbb-3(b)(1), unless the authorization is  terminated or revoked. Performed at [facility], [place] [address], [place], [place] [address]          Radiology Studies: No results found.      Scheduled Meds: . vitamin C  500 mg Oral Daily  . benzonatate  200 mg Oral TID  . dexamethasone (DECADRON) injection  6 mg Intravenous Q24H  . enoxaparin (LOVENOX) injection  60 mg Subcutaneous Q24H  . feeding supplement (ENSURE ENLIVE)  237 mL Oral BID BM  . fluticasone  2 spray Each Nare QPM  . furosemide  40 mg Intravenous Daily  . gabapentin  300 mg Oral TID  . insulin aspart  0-9 Units Subcutaneous TID WC  . Ipratropium-Albuterol  2 puff Inhalation Q4H  . loratadine  10 mg Oral Daily  . mouth rinse  15 mL Mouth Rinse BID  . mometasone-formoterol  2 puff Inhalation BID  . pantoprazole  40 mg Oral QAC breakfast  . sodium chloride flush  3  mL Intravenous Once  . sucralfate  1 g Oral TID WC & HS  . zinc sulfate  220 mg Oral Daily   Continuous Infusions:    LOS: 4 days    Time spent: 35 minutes    Irine Seal, MD [facility]   To contact the attending provider between 7A-7P or the covering provider during after hours 7P-7A, please log into the web site [email or web address] and access using universal [place] password for that web site. If you do not have the password, please call the hospital operator.  09/01/2019, 6:58 PM

## 2019-09-01 NOTE — Plan of Care (Signed)
  Problem: Coping: Goal: Psychosocial and spiritual needs will be supported Outcome: Progressing   Problem: Respiratory: Goal: Will maintain a patent airway Outcome: Progressing Goal: Complications related to the disease process, condition or treatment will be avoided or minimized Outcome: Progressing   

## 2019-09-02 LAB — COMPREHENSIVE METABOLIC PANEL
ALT: 33 U/L (ref 0–44)
AST: 25 U/L (ref 15–41)
Albumin: 3.1 g/dL — ABNORMAL LOW (ref 3.5–5.0)
Alkaline Phosphatase: 52 U/L (ref 38–126)
Anion gap: 11 (ref 5–15)
BUN: 26 mg/dL — ABNORMAL HIGH (ref 6–20)
CO2: 27 mmol/L (ref 22–32)
Calcium: 8.3 mg/dL — ABNORMAL LOW (ref 8.9–10.3)
Chloride: 104 mmol/L (ref 98–111)
Creatinine, Ser: 0.83 mg/dL (ref 0.44–1.00)
GFR calc Af Amer: >60 mL/min (ref >60)
GFR calc non Af Amer: >60 mL/min (ref >60)
Glucose, Bld: 183 mg/dL — ABNORMAL HIGH (ref 70–99)
Potassium: 4 mmol/L (ref 3.5–5.1)
Sodium: 142 mmol/L (ref 135–145)
Total Bilirubin: 0.5 mg/dL (ref 0.3–1.2)
Total Protein: 6.2 g/dL — ABNORMAL LOW (ref 6.5–8.1)

## 2019-09-02 LAB — CBC WITH DIFFERENTIAL/PLATELET
Abs Immature Granulocytes: 0.07 10*3/uL (ref 0.00–0.07)
Basophils Absolute: 0 10*3/uL (ref 0.0–0.1)
Basophils Relative: 0 %
Eosinophils Absolute: 0 10*3/uL (ref 0.0–0.5)
Eosinophils Relative: 0 %
HCT: 43.2 % (ref 36.0–46.0)
Hemoglobin: 14.2 g/dL (ref 12.0–15.0)
Immature Granulocytes: 1 %
Lymphocytes Relative: 20 %
Lymphs Abs: 1.2 10*3/uL (ref 0.7–4.0)
MCH: 28.8 pg (ref 26.0–34.0)
MCHC: 32.9 g/dL (ref 30.0–36.0)
MCV: 87.6 fL (ref 80.0–100.0)
Monocytes Absolute: 0.3 10*3/uL (ref 0.1–1.0)
Monocytes Relative: 4 %
Neutro Abs: 4.3 10*3/uL (ref 1.7–7.7)
Neutrophils Relative %: 75 %
Platelets: 421 10*3/uL — ABNORMAL HIGH (ref 150–400)
RBC: 4.93 MIL/uL (ref 3.87–5.11)
RDW: 13.8 % (ref 11.5–15.5)
WBC: 5.8 10*3/uL (ref 4.0–10.5)
nRBC: 0 % (ref 0.0–0.2)

## 2019-09-02 LAB — D-DIMER, QUANTITATIVE: D-Dimer, Quant: 0.38 ug/mL-FEU (ref 0.00–0.50)

## 2019-09-02 LAB — GLUCOSE, CAPILLARY
Glucose-Capillary: 162 mg/dL — ABNORMAL HIGH (ref 70–99)
Glucose-Capillary: 134 mg/dL — ABNORMAL HIGH (ref 70–99)
Glucose-Capillary: 105 mg/dL — ABNORMAL HIGH (ref 70–99)
Glucose-Capillary: 123 mg/dL — ABNORMAL HIGH (ref 70–99)

## 2019-09-02 LAB — C-REACTIVE PROTEIN: CRP: 1.2 mg/dL — ABNORMAL HIGH (ref <1.0)

## 2019-09-02 LAB — MAGNESIUM: Magnesium: 2.3 mg/dL (ref 1.7–2.4)

## 2019-09-02 LAB — FERRITIN: Ferritin: 417 ng/mL — ABNORMAL HIGH (ref 11–307)

## 2019-09-02 LAB — PHOSPHORUS: Phosphorus: 3.9 mg/dL (ref 2.5–4.6)

## 2019-09-02 MED ORDER — IPRATROPIUM-ALBUTEROL 20-100 MCG/ACT IN AERS
2.0000 | INHALATION_SPRAY | Freq: Three times a day (TID) | RESPIRATORY_TRACT | Status: DC
  Administered 2019-09-02 – 2019-09-05 (×10): 2 via RESPIRATORY_TRACT

## 2019-09-02 MED ORDER — IPRATROPIUM-ALBUTEROL 20-100 MCG/ACT IN AERS
2.0000 | INHALATION_SPRAY | Freq: Four times a day (QID) | RESPIRATORY_TRACT | Status: DC
  Administered 2019-09-02: 2 via RESPIRATORY_TRACT

## 2019-09-02 MED ORDER — OXYCODONE HCL 5 MG PO TABS
15.0000 mg | ORAL_TABLET | Freq: Four times a day (QID) | ORAL | Status: DC | PRN
  Administered 2019-09-02 – 2019-09-03 (×3): 15 mg via ORAL
  Filled 2019-09-02 (×3): qty 3

## 2019-09-02 NOTE — Plan of Care (Signed)
  Problem: Coping: Goal: Psychosocial and spiritual needs will be supported Outcome: Progressing   Problem: Respiratory: Goal: Will maintain a patent airway Outcome: Progressing Goal: Complications related to the disease process, condition or treatment will be avoided or minimized Outcome: Progressing   

## 2019-09-02 NOTE — Progress Notes (Signed)
Occupational Therapy Evaluation (late entry)  Pt admitted with the below listed diagnosis, and demonstrates the below listed deficits.  She fatigues quite quickly with simple ADLs tasks with DOE 4/4, 02 sats 91% on 2L supplemental 02, and HR 125.  She requires mod coaching to implement pursed lip breathing.   Overall, she requires supervision - min A for ADLs and supervision - min guard for functional mobility.  She lives with family and has good support from spouse.  She reports she is quite sedentary at home due to back pain, and has been struggling with depression - she has a Veterinary surgeon.  Discussed apps she could download to help with breathing and anxiety - she is familiar with these from her counselor and has been using them to help her sleep - encouraged her to utilize the breathing exercises also and encouraged use of IS.  She was provided with theraband and instructed in UE exercises.  She will benefit from continued OT to maximize independence and safety with ADLs.  Do not anticipate she will require follow up OT at discharge.  She has all necessary DME and AE from previous back surgery.  Will follow.      09/01/19 1350  OT Visit Information  Last OT Received On 09/01/19  Assistance Needed +1  History of Present Illness 44 y.o. female with medical history significant for obesity, fibromyalgia,  chronic back pain from lumbar disc disease, multiple back surgeries, PE,  on chronic opiates and anxiety/depression. pt admitted with cough, SOB, and mild fever, Covid +  Precautions  Precautions Fall  Precaution Comments monitor O2 sats  Home Living  Family/patient expects to be discharged to: Private residence  Living Arrangements Spouse/significant other  Available Help at Discharge Family  Type of Home House  Home Access Stairs to enter  Entrance Stairs-Number of Steps 1  Entrance Stairs-Rails None  Home Layout One level  Bathroom Shower/Tub Tub/shower unit  Bathroom Toilet Handicapped  height  Bathroom Accessibility Yes  How Accessible Accessible via wheelchair  Home Equipment [place] - 2 wheels;Walker - 4 wheels;Tub bench;Comptroller  Additional Comments lives with spouse, and children 37, 55, 18, 13  Prior Function  Level of Independence Independent;Independent with assistive device(s)  Comments uses rollator at times, reports limited activity level d/t back pain  Communication  Communication No difficulties  Pain Assessment  Pain Assessment 0-10  Pain Score 8  Pain Location back   Pain Descriptors / Indicators Aching  Pain Intervention(s) Monitored during session;Patient requesting pain meds-RN notified  Cognition  Arousal/Alertness Awake/alert  Behavior During Therapy WFL for tasks assessed/performed  Overall Cognitive Status Within Functional Limits for tasks assessed  Upper Extremity Assessment  Upper Extremity Assessment Overall WFL for tasks assessed  Lower Extremity Assessment  Lower Extremity Assessment Overall WFL for tasks assessed  ADL  Overall ADL's  Needs assistance/impaired  Eating/Feeding Independent  Grooming Wash/dry hands;Wash/dry face;Oral care;Brushing hair;Set up;Sitting  Upper Body Bathing Set up;Sitting  Lower Body Bathing Minimal assistance;Sit to/from stand  Lower Body Bathing Details (indicate cue type and reason) due to fatigue  Upper Body Dressing  Set up;Sitting  Lower Body Dressing Minimal assistance;Sit to/from stand  Lower Body Dressing Details (indicate cue type and reason) due to DOE and fatigue  Toilet Transfer Supervision/safety;Stand-pivot;BSC  Toileting- Clothing Manipulation and Hygiene Supervision/safety  Functional mobility during ADLs Supervision/safety  General ADL Comments upon arrival, DOE 4/4, HR 125, 02 sats 91%.  Pt reports she had just transferred to [facility],  but soiled herself in process, so walked 5 feet to retrieve new underpants and change them, and was having dificulty  catching breath.  Pt coached on pursed lip breathing   Transfers  Overall transfer level Needs assistance  Equipment used None  Transfers Sit to/from Stand;Stand Pivot Transfers  Sit to Stand Supervision  Stand pivot transfers Supervision  Balance  Overall balance assessment Needs assistance  Sitting-balance support No upper extremity supported;Feet supported  Sitting balance-Leahy Scale Good  Standing balance support During functional activity  Standing balance-Leahy Scale Fair  General Comments  General comments (skin integrity, edema, etc.) Pt reports feeling lonely, isolated, and anxious at times.  She reports she has been quite sedentary at home due to back pain, and has been depressive.  She does have a Social worker.   She is aware of the apps headspace and calm, and has been using them at home to help her sleep.  Encouraged her to use the breathing portions of the apps to help with breathing patterns/control.  Offered word search, coloring books, etc, but reports she has been playing some games on her phone to stay occupied.    Exercises  Exercises Other exercises  Other Exercises  Other Exercises Pt provided with level 2 therabanc, and was instructed in horizontal abduction and diagonals.  She was instructed on proper breathing pattern.  She performed 5 reps each exercise with cues for breathing   Other Exercises encouraged use of IS   OT - End of Session  Equipment Utilized During Treatment Oxygen  Activity Tolerance Patient limited by fatigue  Patient left in bed;with call bell/phone within reach  OT Assessment  OT Recommendation/Assessment Patient needs continued OT Services  OT Visit Diagnosis Unsteadiness on feet (R26.81);Muscle weakness (generalized) (M62.81)  OT Problem List Decreased strength;Decreased activity tolerance;Impaired balance (sitting and/or standing);Decreased knowledge of use of DME or AE;Cardiopulmonary status limiting activity;Pain  OT Plan  OT Frequency  (ACUTE ONLY) Min 2X/week  OT Treatment/Interventions (ACUTE ONLY) Self-care/ADL training;Therapeutic exercise;Therapeutic activities;Patient/family education;Energy conservation;DME and/or AE instruction  AM-PAC OT "6 Clicks" Daily Activity Outcome Measure (Version 2)  Help from another person eating meals? 4  Help from another person taking care of personal grooming? 3  Help from another person toileting, which includes using toliet, bedpan, or urinal? 3  Help from another person bathing (including washing, rinsing, drying)? 3  Help from another person to put on and taking off regular upper body clothing? 3  Help from another person to put on and taking off regular lower body clothing? 3  6 Click Score 19  OT Recommendation  Follow Up Recommendations No OT follow up;Supervision/Assistance - 24 hour  OT Equipment None recommended by OT  Individuals Consulted  Consulted and Agree with Results and Recommendations Patient  Acute Rehab OT Goals  Patient Stated Goal to feel better  OT Goal Formulation With patient  Time For Goal Achievement 09/16/19  Potential to Achieve Goals Good  OT Time Calculation  OT Start Time (ACUTE ONLY) 1334  OT Stop Time (ACUTE ONLY) 1405  OT Time Calculation (min) 31 min  OT General Charges  $OT Visit 1 Visit  OT Evaluation  $OT Eval Moderate Complexity 1 Mod  OT Treatments  $Therapeutic Activity 8-22 mins  Written Expression  Dominant Hand Right  Nilsa Nutting., OTR/L Acute Rehabilitation Services Pager [phone] Office [phone]

## 2019-09-02 NOTE — Progress Notes (Signed)
PROGRESS NOTE    [name]  BHA:[id number] DOB: 08-25-1975 DOA: 08/28/2019 PCP: Laurey Morale, MD    Brief Narrative:  Patient is a 44 year old female with history of obesity, chronic back pain from lumbar disc disease on chronic opioids, anxiety/depression, hypertension, GERD, presented to ED with trouble breathing.  Patient reported that her husband had tested positive for Covid last month.  Approximately 10 days ago she started noticing some mild symptoms such as cough and mild fevers.  In ED, patient was found to have O2 sats 87% on room air, placed on 2 L.  CRP 9.8, ferritin 1300, procalcitonin level mildly elevated at 0.52 but normal WBCs.   COVID-19 PCR positive   Assessment & Plan:   Principal Problem:   Acute respiratory failure with hypoxia (HCC) Active Problems:   Obesity (BMI 30-39.9)   ANXIETY DISORDER, GENERALIZED   Depression   Hereditary and idiopathic peripheral neuropathy   GERD (gastroesophageal reflux disease)   HTN (hypertension)   Spinal stenosis of lumbar region   DDD (degenerative disc disease), lumbar   Major depressive disorder with single episode, in partial remission (HCC)   Chronic pain   Acute respiratory disease due to COVID-19 virus  1 acute hypoxic respiratory failure secondary to acute COVID-19 viral pneumonia during ongoing COVID-19 pandemic, POA Patient had presented with shortness of breath, nausea fevers cough.  CT angiogram chest negative for PE however did show diffuse bilateral groundglass airspace opacities.  Patient noted to be hypoxic with ongoing requirement of 2 L nasal cannula.  Patient with significant shortness of breath on minimal exertion per patient and RN on 08/31/2019.. Inflammatory markers trending down. Continue IV Decadron, s/p 5 days remdesivir day.  Patient status post Actemra 08/28/2019.  Continue to wean O2, vitamin C, zinc, albuterol, Tylenol.  Ambulatory O2 sats.  Continue Flonase, Dulera, Combivent inhalers.  Patient  placed on Lasix '40mg'$  IV daily (08/31/2019) with urine output of 2.925 L over the past 24 hours.  Patient is -4.326 L during this hospitalization.  BNP elevated and trending down.  Continue IV Lasix through today and reassess tomorrow.  Strict I's and O's.  Daily weights.  Supportive care.  2.  Nausea/history of GERD/ Likely exacerbated by Covid.  IV fluids have been saline locked.  Antiemetics as needed.  Continue Carafate and PPI.  Outpatient follow-up with GI.     3.  Anxiety/depression Ativan as needed.  4.  History of spinal stenosis of the lumbar region/DDD/chronic pain syndrome/peripheral neuropathy Patient on IV pain medications due to persistent nausea.  Nausea improved.  Patient currently on IV morphine as needed.  Increase oxycodone to 15 mg every 6 hours as needed and if continued improvement could likely decrease to every 4 hours as needed.  Follow.   5.  Hypertension BP stable.    6.  Obesity   DVT prophylaxis: Lovenox Code Status: Full Family Communication: Updated patient.  No family at bedside. Disposition Plan:  . Patient came from:Home           . Anticipated d/c place: Home . Barriers to d/c OR conditions which need to be met to effect a safe d/c: Home once improved clinically, improvement in hypoxia, tolerating diet, completion of IV remdesivir.   Consultants:   None  Procedures:   CT angiogram chest 08/28/2019  Chest x-ray 08/28/2019    Antimicrobials:   None   Subjective: Patient laying in bed reading the menu.  States she feels her shortness of breath is continually  improving and she is headed in the right direction however she does not feel she is at her baseline yet or close to her baseline yet.  Shortness of breath on minimal exertion improving.  Tolerating current diet.  No nausea or emesis.   Objective: Vitals:   09/01/19 2200 09/02/19 0517 09/02/19 0829 09/02/19 1124  BP: 127/89 125/83 135/82 119/78  Pulse: 66 65 75 87  Resp: '18 20 20 20    '$ Temp: 98.2 F (36.8 C) 97.9 F (36.6 C) 97.9 F (36.6 C) 97.7 F (36.5 C)  TempSrc: Oral Oral Oral Oral  SpO2: 94% 95% 92% 92%  Weight:  122.4 kg    Height:        Intake/Output Summary (Last 24 hours) at 09/02/2019 1300 Last data filed at 09/02/2019 1226 Gross per 24 hour  Intake 600 ml  Output 2525 ml  Net -1925 ml   Filed Weights   08/31/19 1308 09/01/19 0421 09/02/19 0517  Weight: 120.9 kg 119.7 kg 122.4 kg    Examination:  General exam: NAD. Respiratory system: Some decreased breath sounds in the bases.  Fair air movement.  Cardiovascular system: Regular rate rhythm no murmurs rubs or gallops.  No JVD.  No lower extremity edema.   Gastrointestinal system: Abdomen is soft, nontender, nondistended, positive bowel sounds.  No rebound.  No guarding.    Central nervous system: Alert and oriented. No focal neurological deficits. Extremities: Symmetric 5 x 5 power. Skin: No rashes, lesions or ulcers Psychiatry: Judgement and insight appear normal. Mood & affect appropriate.     Data Reviewed: I have personally reviewed following labs and imaging studies  CBC: Recent Labs  Lab 08/29/19 0420 08/30/19 0334 08/31/19 0328 09/01/19 0347 09/02/19 0400  WBC 2.2* 3.1* 5.6 5.1 5.8  NEUTROABS 1.3* 2.2 3.9 3.7 4.3  HGB 13.9 13.6 13.0 13.8 14.2  HCT 42.7 42.4 40.4 41.7 43.2  MCV 88.6 89.3 89.8 88.3 87.6  PLT 194 264 313 364 093*   Basic Metabolic Panel: Recent Labs  Lab 08/29/19 0420 08/30/19 0334 08/31/19 0328 09/01/19 0347 09/02/19 0400  NA 140 142 142 144 142  K 4.3 4.2 3.8 3.7 4.0  CL 106 109 108 106 104  CO2 '23 24 23 25 27  '$ GLUCOSE 157* 167* 160* 142* 183*  BUN 13 21* 26* 21* 26*  CREATININE 0.65 0.73 0.80 0.70 0.83  CALCIUM 8.5* 8.5* 8.4* 8.3* 8.3*  MG  --   --   --  2.2 2.3  PHOS  --   --   --  4.2 3.9   GFR: Estimated Creatinine Clearance: 119.9 mL/min (by C-G formula based on SCr of 0.83 mg/dL). Liver Function Tests: Recent Labs  Lab 08/29/19 0420  08/30/19 0334 08/31/19 0328 09/01/19 0347 09/02/19 0400  AST 37 '25 22 29 25  '$ ALT 40 33 25 33 33  ALKPHOS 47 44 44 46 52  BILITOT 0.5 0.2* 0.3 0.3 0.5  PROT 6.8 6.6 6.0* 6.3* 6.2*  ALBUMIN 3.1* 3.0* 2.9* 3.2* 3.1*   No results for input(s): LIPASE, AMYLASE in the last 168 hours. No results for input(s): AMMONIA in the last 168 hours. Coagulation Profile: No results for input(s): INR, PROTIME in the last 168 hours. Cardiac Enzymes: No results for input(s): CKTOTAL, CKMB, CKMBINDEX, TROPONINI in the last 168 hours. BNP (last 3 results) No results for input(s): PROBNP in the last 8760 hours. HbA1C: No results for input(s): HGBA1C in the last 72 hours. CBG: Recent Labs  Lab 09/01/19 1113  09/01/19 1653 09/01/19 2157 09/02/19 0818 09/02/19 1121  GLUCAP 103* 122* 116* 162* 134*   Lipid Profile: No results for input(s): CHOL, HDL, LDLCALC, TRIG, CHOLHDL, LDLDIRECT in the last 72 hours. Thyroid Function Tests: No results for input(s): TSH, T4TOTAL, FREET4, T3FREE, THYROIDAB in the last 72 hours. Anemia Panel: Recent Labs    09/01/19 0347 09/02/19 0400  FERRITIN 484* 417*   Sepsis Labs: Recent Labs  Lab 08/28/19 1300 08/28/19 1318 08/28/19 1525 08/29/19 0420  PROCALCITON  --  0.52  --  0.29  LATICACIDVEN 2.0*  --  1.5  --     Recent Results (from the past 240 hour(s))  Blood Culture (routine x 2)     Status: None (Preliminary result)   Collection Time: 08/28/19  1:17 PM   Specimen: BLOOD RIGHT HAND  Result Value Ref Range Status   Specimen Description   Final    BLOOD RIGHT HAND Performed at [facility], [place] [address] [place], [place] [address]    Special Requests   Final    BOTTLES DRAWN AEROBIC ONLY Blood Culture results may not be optimal due to an inadequate volume of blood received in culture bottles Performed at [facility] [place] [address] [place], [place] [address]    Culture   Final    NO GROWTH 4  DAYS Performed at [facility], [place] [address], [place], [place] [address]    Report Status PENDING  Incomplete  Blood Culture (routine x 2)     Status: None (Preliminary result)   Collection Time: 08/28/19  1:18 PM   Specimen: BLOOD  Result Value Ref Range Status   Specimen Description   Final    BLOOD RIGHT ANTECUBITAL Performed at [facility] [address], [place], [place] [address]    Special Requests   Final    BOTTLES DRAWN AEROBIC AND ANAEROBIC Blood Culture adequate volume Performed at [place] [address], [place], [place] [address]    Culture   Final    NO GROWTH 4 DAYS Performed at [facility], [place] [address] [place],  [address]    Report Status PENDING  Incomplete  Respiratory Panel by RT PCR (Flu A&B, Covid) - Nasopharyngeal Swab     Status: Abnormal   Collection Time: 08/28/19  3:06 PM   Specimen: Nasopharyngeal Swab  Result Value Ref Range Status   SARS Coronavirus 2 by RT PCR POSITIVE (A) NEGATIVE Final    Comment: RESULT CALLED TO, READ BACK BY AND VERIFIED WITH: PONDS,J. RN '@[id number]'$  ON 03.29.2021 BY COHEN,K (NOTE) SARS-CoV-2 target nucleic acids are DETECTED. SARS-CoV-2 RNA is generally detectable in upper respiratory specimens  during the acute phase of infection. Positive results are indicative of the presence of the identified virus, but do not rule out bacterial infection or co-infection with other pathogens not detected by the test. Clinical correlation with patient history and other diagnostic information is necessary to determine patient infection status. The expected result is Negative. Fact Sheet for Patients:  PinkCheek.be Fact Sheet for Healthcare Providers: GravelBags.it This test is not yet approved or cleared by the [place] FDA and  has been authorized for detection and/or diagnosis of SARS-CoV-2 by FDA under an  Emergency Use Authorization (EUA).  This EUA will remain in effect (meaning this test can be  used) for the duration of  the COVID-19 declaration under Section 564(b)(1) of the Act, 21 U.S.C. section 360bbb-3(b)(1), unless the  authorization is terminated or revoked sooner.    Influenza A by PCR NEGATIVE NEGATIVE Final   Influenza B by PCR NEGATIVE NEGATIVE Final    Comment: (NOTE) The Xpert Xpress SARS-CoV-2/FLU/RSV assay is intended as an aid in  the diagnosis of influenza from Nasopharyngeal swab specimens and  should not be used as a sole basis for treatment. Nasal washings and  aspirates are unacceptable for Xpert Xpress SARS-CoV-2/FLU/RSV  testing. Fact Sheet for Patients: PinkCheek.be Fact Sheet for Healthcare Providers: GravelBags.it This test is not yet approved or cleared by the [place] FDA and  has been authorized for detection and/or diagnosis of SARS-CoV-2 by  FDA under an Emergency Use Authorization (EUA). This EUA will remain  in effect (meaning this test can be used) for the duration of the  Covid-19 declaration under Section 564(b)(1) of the Act, 21  U.S.C. section 360bbb-3(b)(1), unless the authorization is  terminated or revoked. Performed at [facility], [place] [address] [place], [place] [address]          Radiology Studies: No results found.      Scheduled Meds: . vitamin C  500 mg Oral Daily  . benzonatate  200 mg Oral TID  . dexamethasone (DECADRON) injection  6 mg Intravenous Q24H  . enoxaparin (LOVENOX) injection  60 mg Subcutaneous Q24H  . famotidine  40 mg Oral QHS  . feeding supplement (ENSURE ENLIVE)  237 mL Oral BID BM  . fluticasone  2 spray Each Nare QPM  . gabapentin  300 mg Oral TID  . insulin aspart  0-9 Units Subcutaneous TID WC  . Ipratropium-Albuterol  2 puff Inhalation TID  . loratadine  10 mg Oral Daily  . mouth rinse  15 mL Mouth Rinse BID  .  mometasone-formoterol  2 puff Inhalation BID  . pantoprazole  40 mg Oral QAC breakfast  . sodium chloride flush  3 mL Intravenous Once  . sucralfate  1 g Oral TID WC & HS  . zinc sulfate  220 mg Oral Daily   Continuous Infusions:    LOS: 5 days    Time spent: 35 minutes    Irine Seal, MD [facility]   To contact the attending provider between 7A-7P or the covering provider during after hours 7P-7A, please log into the web site [email or web address] and access using universal [place] password for that web site. If you do not have the password, please call the hospital operator.  09/02/2019, 1:00 PM

## 2019-09-02 NOTE — Discharge Instructions (Signed)
COVID-19 COVID-19 is a respiratory infection that is caused by a virus called severe acute respiratory syndrome coronavirus 2 (SARS-CoV-2). The disease is also known as coronavirus disease or novel coronavirus. In some people, the virus may not cause any symptoms. In others, it may cause a serious infection. The infection can get worse quickly and can lead to complications, such as:  Pneumonia, or infection of the lungs.  Acute respiratory distress syndrome or ARDS. This is a condition in which fluid build-up in the lungs prevents the lungs from filling with air and passing oxygen into the blood.  Acute respiratory failure. This is a condition in which there is not enough oxygen passing from the lungs to the body or when carbon dioxide is not passing from the lungs out of the body.  Sepsis or septic shock. This is a serious bodily reaction to an infection.  Blood clotting problems.  Secondary infections due to bacteria or fungus.  Organ failure. This is when your body's organs stop working. The virus that causes COVID-19 is contagious. This means that it can spread from person to person through droplets from coughs and sneezes (respiratory secretions). What are the causes? This illness is caused by a virus. You may catch the virus by:  Breathing in droplets from an infected person. Droplets can be spread by a person breathing, speaking, singing, coughing, or sneezing.  Touching something, like a table or a doorknob, that was exposed to the virus (contaminated) and then touching your mouth, nose, or eyes. What increases the risk? Risk for infection You are more likely to be infected with this virus if you:  Are within 6 feet (2 meters) of a person with COVID-19.  Provide care for or live with a person who is infected with COVID-19.  Spend time in crowded indoor spaces or live in shared housing. Risk for serious illness You are more likely to become seriously ill from the virus if  you:  Are 50 years of age or older. The higher your age, the more you are at risk for serious illness.  Live in a nursing home or long-term care facility.  Have cancer.  Have a long-term (chronic) disease such as: ? Chronic lung disease, including chronic obstructive pulmonary disease or asthma. ? A long-term disease that lowers your body's ability to fight infection (immunocompromised). ? Heart disease, including heart failure, a condition in which the arteries that lead to the heart become narrow or blocked (coronary artery disease), a disease which makes the heart muscle thick, weak, or stiff (cardiomyopathy). ? Diabetes. ? Chronic kidney disease. ? Sickle cell disease, a condition in which red blood cells have an abnormal "sickle" shape. ? Liver disease.  Are obese. What are the signs or symptoms? Symptoms of this condition can range from mild to severe. Symptoms may appear any time from 2 to 14 days after being exposed to the virus. They include:  A fever or chills.  A cough.  Difficulty breathing.  Headaches, body aches, or muscle aches.  Runny or stuffy (congested) nose.  A sore throat.  New loss of taste or smell. Some people may also have stomach problems, such as nausea, vomiting, or diarrhea. Other people may not have any symptoms of COVID-19. How is this diagnosed? This condition may be diagnosed based on:  Your signs and symptoms, especially if: ? You live in an area with a COVID-19 outbreak. ? You recently traveled to or from an area where the virus is common. ? You   provide care for or live with a person who was diagnosed with COVID-19. ? You were exposed to a person who was diagnosed with COVID-19.  A physical exam.  Lab tests, which may include: ? Taking a sample of fluid from the back of your nose and throat (nasopharyngeal fluid), your nose, or your throat using a swab. ? A sample of mucus from your lungs (sputum). ? Blood tests.  Imaging tests,  which may include, X-rays, CT scan, or ultrasound. How is this treated? At present, there is no medicine to treat COVID-19. Medicines that treat other diseases are being used on a trial basis to see if they are effective against COVID-19. Your health care provider will talk with you about ways to treat your symptoms. For most people, the infection is mild and can be managed at home with rest, fluids, and over-the-counter medicines. Treatment for a serious infection usually takes places in a hospital intensive care unit (ICU). It may include one or more of the following treatments. These treatments are given until your symptoms improve.  Receiving fluids and medicines through an IV.  Supplemental oxygen. Extra oxygen is given through a tube in the nose, a face mask, or a hood.  Positioning you to lie on your stomach (prone position). This makes it easier for oxygen to get into the lungs.  Continuous positive airway pressure (CPAP) or bi-level positive airway pressure (BPAP) machine. This treatment uses mild air pressure to keep the airways open. A tube that is connected to a motor delivers oxygen to the body.  Ventilator. This treatment moves air into and out of the lungs by using a tube that is placed in your windpipe.  Tracheostomy. This is a procedure to create a hole in the neck so that a breathing tube can be inserted.  Extracorporeal membrane oxygenation (ECMO). This procedure gives the lungs a chance to recover by taking over the functions of the heart and lungs. It supplies oxygen to the body and removes carbon dioxide. Follow these instructions at home: Lifestyle  If you are sick, stay home except to get medical care. Your health care provider will tell you how long to stay home. Call your health care provider before you go for medical care.  Rest at home as told by your health care provider.  Do not use any products that contain nicotine or tobacco, such as cigarettes,  e-cigarettes, and chewing tobacco. If you need help quitting, ask your health care provider.  Return to your normal activities as told by your health care provider. Ask your health care provider what activities are safe for you. General instructions  Take over-the-counter and prescription medicines only as told by your health care provider.  Drink enough fluid to keep your urine pale yellow.  Keep all follow-up visits as told by your health care provider. This is important. How is this prevented?  There is no vaccine to help prevent COVID-19 infection. However, there are steps you can take to protect yourself and others from this virus. To protect yourself:   Do not travel to areas where COVID-19 is a risk. The areas where COVID-19 is reported change often. To identify high-risk areas and travel restrictions, check the CDC travel website: wwwnc.cdc.gov/travel/notices  If you live in, or must travel to, an area where COVID-19 is a risk, take precautions to avoid infection. ? Stay away from people who are sick. ? Wash your hands often with soap and water for 20 seconds. If soap and water   are not available, use an alcohol-based hand sanitizer. ? Avoid touching your mouth, face, eyes, or nose. ? Avoid going out in public, follow guidance from your state and local health authorities. ? If you must go out in public, wear a cloth face covering or face mask. Make sure your mask covers your nose and mouth. ? Avoid crowded indoor spaces. Stay at least 6 feet (2 meters) away from others. ? Disinfect objects and surfaces that are frequently touched every day. This may include:  Counters and tables.  Doorknobs and light switches.  Sinks and faucets.  Electronics, such as phones, remote controls, keyboards, computers, and tablets. To protect others: If you have symptoms of COVID-19, take steps to prevent the virus from spreading to others.  If you think you have a COVID-19 infection, contact  your health care provider right away. Tell your health care team that you think you may have a COVID-19 infection.  Stay home. Leave your house only to seek medical care. Do not use public transport.  Do not travel while you are sick.  Wash your hands often with soap and water for 20 seconds. If soap and water are not available, use alcohol-based hand sanitizer.  Stay away from other members of your household. Let healthy household members care for children and pets, if possible. If you have to care for children or pets, wash your hands often and wear a mask. If possible, stay in your own room, separate from others. Use a different bathroom.  Make sure that all people in your household wash their hands well and often.  Cough or sneeze into a tissue or your sleeve or elbow. Do not cough or sneeze into your hand or into the air.  Wear a cloth face covering or face mask. Make sure your mask covers your nose and mouth. Where to find more information  Centers for Disease Control and Prevention: www.cdc.gov/coronavirus/2019-ncov/index.html  World Health Organization: www.who.int/health-topics/coronavirus Contact a health care provider if:  You live in or have traveled to an area where COVID-19 is a risk and you have symptoms of the infection.  You have had contact with someone who has COVID-19 and you have symptoms of the infection. Get help right away if:  You have trouble breathing.  You have pain or pressure in your chest.  You have confusion.  You have bluish lips and fingernails.  You have difficulty waking from sleep.  You have symptoms that get worse. These symptoms may represent a serious problem that is an emergency. Do not wait to see if the symptoms will go away. Get medical help right away. Call your local emergency services (911 in the [place] Do not drive yourself to the hospital. Let the emergency medical personnel know if you think you have  COVID-19. Summary  COVID-19 is a respiratory infection that is caused by a virus. It is also known as coronavirus disease or novel coronavirus. It can cause serious infections, such as pneumonia, acute respiratory distress syndrome, acute respiratory failure, or sepsis.  The virus that causes COVID-19 is contagious. This means that it can spread from person to person through droplets from breathing, speaking, singing, coughing, or sneezing.  You are more likely to develop a serious illness if you are 50 years of age or older, have a weak immune system, live in a nursing home, or have chronic disease.  There is no medicine to treat COVID-19. Your health care provider will talk with you about ways to treat your symptoms.    Take steps to protect yourself and others from infection. Wash your hands often and disinfect objects and surfaces that are frequently touched every day. Stay away from people who are sick and wear a mask if you are sick. This information is not intended to replace advice given to you by your health care provider. Make sure you discuss any questions you have with your health care provider. Document Revised: 03/17/2019 Document Reviewed: 06/23/2018 Elsevier Patient Education  2020 [employer].  COVID-19: How to Protect Yourself and Others Know how it spreads  There is currently no vaccine to prevent coronavirus disease 2019 (COVID-19).  The best way to prevent illness is to avoid being exposed to this virus.  The virus is thought to spread mainly from person-to-person. ? Between people who are in close contact with one another (within about 6 feet). ? Through respiratory droplets produced when an infected person coughs, sneezes or talks. ? These droplets can land in the mouths or noses of people who are nearby or possibly be inhaled into the lungs. ? COVID-19 may be spread by people who are not showing symptoms. Everyone should Clean your hands often  Wash your hands  often with soap and water for at least 20 seconds especially after you have been in a public place, or after blowing your nose, coughing, or sneezing.  If soap and water are not readily available, use a hand sanitizer that contains at least 60% alcohol. Cover all surfaces of your hands and rub them together until they feel dry.  Avoid touching your eyes, nose, and mouth with unwashed hands. Avoid close contact  Limit contact with others as much as possible.  Avoid close contact with people who are sick.  Put distance between yourself and other people. ? Remember that some people without symptoms may be able to spread virus. ? This is especially important for people who are at higher risk of getting very [email or web address] Cover your mouth and nose with a mask when around others  You could spread COVID-19 to others even if you do not feel sick.  Everyone should wear a mask in public settings and when around people not living in their household, especially when social distancing is difficult to maintain. ? Masks should not be placed on young children under age 92, anyone who has trouble breathing, or is unconscious, incapacitated or otherwise unable to remove the mask without assistance.  The mask is meant to protect other people in case you are infected.  Do NOT use a facemask meant for a Dietitian.  Continue to keep about 6 feet between yourself and others. The mask is not a substitute for social distancing. Cover coughs and sneezes  Always cover your mouth and nose with a tissue when you cough or sneeze or use the inside of your elbow.  Throw used tissues in the trash.  Immediately wash your hands with soap and water for at least 20 seconds. If soap and water are not readily available, clean your hands with a hand sanitizer that contains at least 60% alcohol. Clean and disinfect  Clean AND disinfect  frequently touched surfaces daily. This includes tables, doorknobs, light switches, countertops, handles, desks, phones, keyboards, toilets, faucets, and sinks. [email or web address]  If surfaces are dirty, clean them: Use detergent or soap and water prior to disinfection.  Then, use a household disinfectant. You can see a list of EPA-registered household disinfectants here. [email or web address] 02/01/2019 This information is not intended to replace advice given to you by  your health care provider. Make sure you discuss any questions you have with your health care provider. Document Revised: 02/09/2019 Document Reviewed: 12/08/2018 Elsevier Patient Education  2020 [employer]  What You Should Know About COVID-19 to Protect Yourself and Others Know about COVID-19  Coronavirus (COVID-19) is an illness caused by a virus that can spread from person to person.  The virus that causes COVID-19 is a new coronavirus that has spread throughout the world.  COVID-19 symptoms can range from mild (or no symptoms) to severe illness. Know how COVID-19 is spread  You can become infected by coming into close contact (about 6 feet or two arm lengths) with a person who has COVID-19. COVID-19 is primarily spread from person to person.  You can become infected from respiratory droplets when an infected person coughs, sneezes, or talks.  You may also be able to get it by touching a surface or object that has the virus on it, and then by touching your mouth, nose, or eyes. Protect yourself and others from COVID-19  There is currently no vaccine to protect against COVID-19. The best way to protect yourself is to avoid being exposed to the virus that causes COVID-19.  Stay home as much as possible and avoid close contact with others.  Wear a mask that covers your nose and mouth in public settings.  Clean and disinfect frequently touched  surfaces.  Wash your hands often with soap and water for at least 20 seconds, or use an alcohol-based hand sanitizer that contains at least 60% alcohol. Practice social distancing  Buy groceries and medicine, go to the doctor, and complete banking activities online when possible.  If you must go in person, stay at least 6 feet away from others and disinfect items you must touch.  Get deliveries and takeout, and limit in-person contact as much as possible. Prevent the spread of COVID-19 if you are sick  Stay home if you are sick, except to get medical care.  Avoid public transportation, ride-sharing, or taxis.  Separate yourself from other people and pets in your home.  There is no specific treatment for COVID-19, but you can seek medical care to help relieve your symptoms.  If you need medical attention, call ahead. Know your risk for severe illness  Everyone is at risk of getting COVID-19.  Older adults and people of any age who have serious underlying medical conditions may be at higher risk for more severe illness. [email or web address] 10/31/2018 This information is not intended to replace advice given to you by your health care provider. Make sure you discuss any questions you have with your health care provider. Document Revised: 05/04/2019 Document Reviewed: 05/04/2019 Elsevier Patient Education  2020 [employer].

## 2019-09-03 LAB — CBC
HCT: 44.3 % (ref 36.0–46.0)
Hemoglobin: 14.7 g/dL (ref 12.0–15.0)
MCH: 29.6 pg (ref 26.0–34.0)
MCHC: 33.2 g/dL (ref 30.0–36.0)
MCV: 89.1 fL (ref 80.0–100.0)
Platelets: 459 10*3/uL — ABNORMAL HIGH (ref 150–400)
RBC: 4.97 MIL/uL (ref 3.87–5.11)
RDW: 13.9 % (ref 11.5–15.5)
WBC: 9.7 10*3/uL (ref 4.0–10.5)
nRBC: 0 % (ref 0.0–0.2)

## 2019-09-03 LAB — GLUCOSE, CAPILLARY
Glucose-Capillary: 135 mg/dL — ABNORMAL HIGH (ref 70–99)
Glucose-Capillary: 169 mg/dL — ABNORMAL HIGH (ref 70–99)
Glucose-Capillary: 125 mg/dL — ABNORMAL HIGH (ref 70–99)
Glucose-Capillary: 138 mg/dL — ABNORMAL HIGH (ref 70–99)

## 2019-09-03 LAB — BASIC METABOLIC PANEL
Anion gap: 10 (ref 5–15)
BUN: 25 mg/dL — ABNORMAL HIGH (ref 6–20)
CO2: 26 mmol/L (ref 22–32)
Calcium: 8.3 mg/dL — ABNORMAL LOW (ref 8.9–10.3)
Chloride: 104 mmol/L (ref 98–111)
Creatinine, Ser: 0.99 mg/dL (ref 0.44–1.00)
GFR calc Af Amer: >60 mL/min (ref >60)
GFR calc non Af Amer: >60 mL/min (ref >60)
Glucose, Bld: 196 mg/dL — ABNORMAL HIGH (ref 70–99)
Potassium: 4.1 mmol/L (ref 3.5–5.1)
Sodium: 140 mmol/L (ref 135–145)

## 2019-09-03 LAB — MAGNESIUM: Magnesium: 2.4 mg/dL (ref 1.7–2.4)

## 2019-09-03 LAB — BRAIN NATRIURETIC PEPTIDE: B Natriuretic Peptide: 43 pg/mL (ref 0.0–100.0)

## 2019-09-03 LAB — PHOSPHORUS: Phosphorus: 3.8 mg/dL (ref 2.5–4.6)

## 2019-09-03 MED ORDER — FUROSEMIDE 10 MG/ML IJ SOLN
40.0000 mg | Freq: Once | INTRAMUSCULAR | Status: AC
  Administered 2019-09-03: 40 mg via INTRAVENOUS
  Filled 2019-09-03: qty 4

## 2019-09-03 MED ORDER — GUAIFENESIN ER 600 MG PO TB12
1200.0000 mg | ORAL_TABLET | Freq: Two times a day (BID) | ORAL | Status: DC
  Administered 2019-09-03 – 2019-09-05 (×5): 1200 mg via ORAL
  Filled 2019-09-03 (×5): qty 2

## 2019-09-03 MED ORDER — HYDROMORPHONE HCL 2 MG PO TABS
8.0000 mg | ORAL_TABLET | Freq: Four times a day (QID) | ORAL | Status: DC | PRN
  Administered 2019-09-04 – 2019-09-05 (×3): 8 mg via ORAL
  Filled 2019-09-03 (×3): qty 4

## 2019-09-03 MED ORDER — OXYCODONE HCL 5 MG PO TABS
15.0000 mg | ORAL_TABLET | ORAL | Status: DC | PRN
  Administered 2019-09-03 – 2019-09-05 (×11): 15 mg via ORAL
  Filled 2019-09-03 (×11): qty 3

## 2019-09-03 NOTE — Progress Notes (Signed)
PROGRESS NOTE    [name]  QIW:[id number] DOB: 1976/01/05 DOA: 08/28/2019 PCP: Laurey Morale, MD    Brief Narrative:  Patient is a 44 year old female with history of obesity, chronic back pain from lumbar disc disease on chronic opioids, anxiety/depression, hypertension, GERD, presented to ED with trouble breathing.  Patient reported that her husband had tested positive for Covid last month.  Approximately 10 days ago she started noticing some mild symptoms such as cough and mild fevers.  In ED, patient was found to have O2 sats 87% on room air, placed on 2 L.  CRP 9.8, ferritin 1300, procalcitonin level mildly elevated at 0.52 but normal WBCs.   COVID-19 PCR positive   Assessment & Plan:   Principal Problem:   Acute respiratory failure with hypoxia (HCC) Active Problems:   Obesity (BMI 30-39.9)   ANXIETY DISORDER, GENERALIZED   Depression   Hereditary and idiopathic peripheral neuropathy   GERD (gastroesophageal reflux disease)   HTN (hypertension)   Spinal stenosis of lumbar region   DDD (degenerative disc disease), lumbar   Major depressive disorder with single episode, in partial remission (HCC)   Chronic pain   Acute respiratory disease due to COVID-19 virus  1 acute hypoxic respiratory failure secondary to acute COVID-19 viral pneumonia during ongoing COVID-19 pandemic, POA Patient had presented with shortness of breath, nausea fevers cough.  CT angiogram chest negative for PE however did show diffuse bilateral groundglass airspace opacities.  Patient noted to be hypoxic with ongoing requirement of 2 L nasal cannula.  Patient with significant shortness of breath on minimal exertion per patient and RN on 08/31/2019.. Inflammatory markers trending down. Continue IV Decadron, s/p 5 days remdesivir.  Patient status post Actemra( 08/28/2019).  Continue to wean O2, vitamin C, zinc, albuterol, Tylenol.  Ambulatory O2 sats.  Continue Flonase, Dulera, Combivent inhalers.  Patient  placed on Lasix '40mg'$  IV daily (08/31/2019) with urine output of 2 L over the past 24 hours.  Patient is -5.106 L during this hospitalization.  BNP elevated and trending down.  Continue IV Lasix through today and reassess tomorrow.  Strict I's and O's.  Daily weights.  Supportive care.  2.  Nausea/history of GERD/ Likely exacerbated by Covid.  IV fluids have been saline locked.  Antiemetics as needed.  Continue Carafate and PPI.  Outpatient follow-up with GI.     3.  Anxiety/depression Ativan as needed.  4.  History of spinal stenosis of the lumbar region/DDD/chronic pain syndrome/peripheral neuropathy Patient on IV pain medications due to persistent nausea.  Nausea improved.  Patient currently on IV morphine as needed.  Change oxycodone back to home regimen of 15 mg every 4 hours as needed.  Resume home regimen of Dilaudid 8 mg every 6 hours as needed for severe pain.  Discontinue IV morphine.  Outpatient follow-up.    5.  Hypertension BP stable.    6.  Obesity   DVT prophylaxis: Lovenox Code Status: Full Family Communication: Updated patient.  No family at bedside. Disposition Plan:  . Patient came from:Home           . Anticipated d/c place: Home . Barriers to d/c OR conditions which need to be met to effect a safe d/c: Home once improved clinically, improvement in hypoxia, tolerating diet, completion of IV remdesivir.   Consultants:   None  Procedures:   CT angiogram chest 08/28/2019  Chest x-ray 08/28/2019    Antimicrobials:   None   Subjective: Patient laying in bed.  Shortness of breath improving daily per patient.  Feels cough is becoming more productive.  Shortness of breath on minimal exertion improving.  Tolerating current diet.   Objective: Vitals:   09/02/19 0829 09/02/19 1124 09/02/19 2029 09/03/19 0515  BP: 135/82 119/78 116/73 120/84  Pulse: 75 87 82 (!) 55  Resp: '20 20 20   '$ Temp: 97.9 F (36.6 C) 97.7 F (36.5 C) 98.4 F (36.9 C) 98.1 F (36.7 C)    TempSrc: Oral Oral Oral Oral  SpO2: 92% 92% 93% 94%  Weight:    119.5 kg  Height:        Intake/Output Summary (Last 24 hours) at 09/03/2019 1206 Last data filed at 09/03/2019 1023 Gross per 24 hour  Intake 920 ml  Output 700 ml  Net 220 ml   Filed Weights   09/01/19 0421 09/02/19 0517 09/03/19 0515  Weight: 119.7 kg 122.4 kg 119.5 kg    Examination:  General exam: NAD. Respiratory system: Decreased breath sounds in the bases.  Fair air movement.  Less tight.  Cardiovascular system: RRR no murmurs rubs or gallops.  No JVD.  No lower extremity edema.  Gastrointestinal system: Abdomen is nontender, nondistended, soft, positive bowel sounds.  No rebound.  No guarding.   Central nervous system: Alert and oriented. No focal neurological deficits. Extremities: Symmetric 5 x 5 power. Skin: No rashes, lesions or ulcers Psychiatry: Judgement and insight appear normal. Mood & affect appropriate.     Data Reviewed: I have personally reviewed following labs and imaging studies  CBC: Recent Labs  Lab 08/29/19 0420 08/29/19 0420 08/30/19 0334 08/31/19 0328 09/01/19 0347 09/02/19 0400 09/03/19 0423  WBC 2.2*   < > 3.1* 5.6 5.1 5.8 9.7  NEUTROABS 1.3*  --  2.2 3.9 3.7 4.3  --   HGB 13.9   < > 13.6 13.0 13.8 14.2 14.7  HCT 42.7   < > 42.4 40.4 41.7 43.2 44.3  MCV 88.6   < > 89.3 89.8 88.3 87.6 89.1  PLT 194   < > 264 313 364 421* 459*   < > = values in this interval not displayed.   Basic Metabolic Panel: Recent Labs  Lab 08/30/19 0334 08/31/19 0328 09/01/19 0347 09/02/19 0400 09/03/19 0423  NA 142 142 144 142 140  K 4.2 3.8 3.7 4.0 4.1  CL 109 108 106 104 104  CO2 '24 23 25 27 26  '$ GLUCOSE 167* 160* 142* 183* 196*  BUN 21* 26* 21* 26* 25*  CREATININE 0.73 0.80 0.70 0.83 0.99  CALCIUM 8.5* 8.4* 8.3* 8.3* 8.3*  MG  --   --  2.2 2.3 2.4  PHOS  --   --  4.2 3.9 3.8   GFR: Estimated Creatinine Clearance: 99.1 mL/min (by C-G formula based on SCr of 0.99 mg/dL). Liver  Function Tests: Recent Labs  Lab 08/29/19 0420 08/30/19 0334 08/31/19 0328 09/01/19 0347 09/02/19 0400  AST 37 '25 22 29 25  '$ ALT 40 33 25 33 33  ALKPHOS 47 44 44 46 52  BILITOT 0.5 0.2* 0.3 0.3 0.5  PROT 6.8 6.6 6.0* 6.3* 6.2*  ALBUMIN 3.1* 3.0* 2.9* 3.2* 3.1*   No results for input(s): LIPASE, AMYLASE in the last 168 hours. No results for input(s): AMMONIA in the last 168 hours. Coagulation Profile: No results for input(s): INR, PROTIME in the last 168 hours. Cardiac Enzymes: No results for input(s): CKTOTAL, CKMB, CKMBINDEX, TROPONINI in the last 168 hours. BNP (last 3 results) No results for input(s):  PROBNP in the last 8760 hours. HbA1C: No results for input(s): HGBA1C in the last 72 hours. CBG: Recent Labs  Lab 09/02/19 0818 09/02/19 1121 09/02/19 1646 09/02/19 2025 09/03/19 0824  GLUCAP 162* 134* 105* 123* 135*   Lipid Profile: No results for input(s): CHOL, HDL, LDLCALC, TRIG, CHOLHDL, LDLDIRECT in the last 72 hours. Thyroid Function Tests: No results for input(s): TSH, T4TOTAL, FREET4, T3FREE, THYROIDAB in the last 72 hours. Anemia Panel: Recent Labs    09/01/19 0347 09/02/19 0400  FERRITIN 484* 417*   Sepsis Labs: Recent Labs  Lab 08/28/19 1300 08/28/19 1318 08/28/19 1525 08/29/19 0420  PROCALCITON  --  0.52  --  0.29  LATICACIDVEN 2.0*  --  1.5  --     Recent Results (from the past 240 hour(s))  Blood Culture (routine x 2)     Status: None (Preliminary result)   Collection Time: 08/28/19  1:17 PM   Specimen: BLOOD RIGHT HAND  Result Value Ref Range Status   Specimen Description   Final    BLOOD RIGHT HAND Performed at [facility], [place] [address] [place], [place] [address]    Special Requests   Final    BOTTLES DRAWN AEROBIC ONLY Blood Culture results may not be optimal due to an inadequate volume of blood received in culture bottles Performed at [place] [address] [place], [place]  [address]    Culture   Final    NO GROWTH 4 DAYS Performed at [facility], [place] [address], [place], [place] [address]    Report Status PENDING  Incomplete  Blood Culture (routine x 2)     Status: None (Preliminary result)   Collection Time: 08/28/19  1:18 PM   Specimen: BLOOD  Result Value Ref Range Status   Specimen Description   Final    BLOOD RIGHT ANTECUBITAL Performed at [place] [address], [place], [place] [address]    Special Requests   Final    BOTTLES DRAWN AEROBIC AND ANAEROBIC Blood Culture adequate volume Performed at [place] [address] [place], [place] [address]    Culture   Final    NO GROWTH 4 DAYS Performed at [facility], [place] [address] [place], [place] [address]    Report Status PENDING  Incomplete  Respiratory Panel by RT PCR (Flu A&B, Covid) - Nasopharyngeal Swab     Status: Abnormal   Collection Time: 08/28/19  3:06 PM   Specimen: Nasopharyngeal Swab  Result Value Ref Range Status   SARS Coronavirus 2 by RT PCR POSITIVE (A) NEGATIVE Final    Comment: RESULT CALLED TO, READ BACK BY AND VERIFIED WITH: PONDS,J. RN '@[id number]'$  ON 03.29.2021 BY COHEN,K (NOTE) SARS-CoV-2 target nucleic acids are DETECTED. SARS-CoV-2 RNA is generally detectable in upper respiratory specimens  during the acute phase of infection. Positive results are indicative of the presence of the identified virus, but do not rule out bacterial infection or co-infection with other pathogens not detected by the test. Clinical correlation with patient history and other diagnostic information is necessary to determine patient infection status. The expected result is Negative. Fact Sheet for Patients:  PinkCheek.be Fact Sheet for Healthcare Providers: GravelBags.it This test is not yet approved or cleared by the [place] FDA and  has been authorized for detection and/or  diagnosis of SARS-CoV-2 by FDA under an Emergency Use Authorization (EUA).  This EUA will remain in effect (meaning this test  can be  used) for the duration of  the COVID-19 declaration under Section 564(b)(1) of the Act, 21 U.S.C. section 360bbb-3(b)(1), unless the authorization is terminated or revoked sooner.    Influenza A by PCR NEGATIVE NEGATIVE Final   Influenza B by PCR NEGATIVE NEGATIVE Final    Comment: (NOTE) The Xpert Xpress SARS-CoV-2/FLU/RSV assay is intended as an aid in  the diagnosis of influenza from Nasopharyngeal swab specimens and  should not be used as a sole basis for treatment. Nasal washings and  aspirates are unacceptable for Xpert Xpress SARS-CoV-2/FLU/RSV  testing. Fact Sheet for Patients: PinkCheek.be Fact Sheet for Healthcare Providers: GravelBags.it This test is not yet approved or cleared by the [place] FDA and  has been authorized for detection and/or diagnosis of SARS-CoV-2 by  FDA under an Emergency Use Authorization (EUA). This EUA will remain  in effect (meaning this test can be used) for the duration of the  Covid-19 declaration under Section 564(b)(1) of the Act, 21  U.S.C. section 360bbb-3(b)(1), unless the authorization is  terminated or revoked. Performed at [facility], [place] [address], [place], [place] [address]          Radiology Studies: No results found.      Scheduled Meds: . vitamin C  500 mg Oral Daily  . benzonatate  200 mg Oral TID  . dexamethasone (DECADRON) injection  6 mg Intravenous Q24H  . enoxaparin (LOVENOX) injection  60 mg Subcutaneous Q24H  . famotidine  40 mg Oral QHS  . feeding supplement (ENSURE ENLIVE)  237 mL Oral BID BM  . fluticasone  2 spray Each Nare QPM  . gabapentin  300 mg Oral TID  . insulin aspart  0-9 Units Subcutaneous TID WC  . Ipratropium-Albuterol  2 puff Inhalation TID  . loratadine  10 mg Oral Daily    . mouth rinse  15 mL Mouth Rinse BID  . mometasone-formoterol  2 puff Inhalation BID  . pantoprazole  40 mg Oral QAC breakfast  . sodium chloride flush  3 mL Intravenous Once  . sucralfate  1 g Oral TID WC & HS  . zinc sulfate  220 mg Oral Daily   Continuous Infusions:    LOS: 6 days    Time spent: 35 minutes    Irine Seal, MD [facility]   To contact the attending provider between 7A-7P or the covering provider during after hours 7P-7A, please log into the web site [email or web address] and access using universal [place] password for that web site. If you do not have the password, please call the hospital operator.  09/03/2019, 12:06 PM

## 2019-09-03 NOTE — Plan of Care (Signed)
  Problem: Coping: Goal: Psychosocial and spiritual needs will be supported Outcome: Progressing   Problem: Respiratory: Goal: Will maintain a patent airway Outcome: Progressing Goal: Complications related to the disease process, condition or treatment will be avoided or minimized Outcome: Progressing   

## 2019-09-03 NOTE — Progress Notes (Signed)
SATURATION QUALIFICATIONS: (This note is used to comply with regulatory documentation for home oxygen)  Patient Saturations on Room Air at Rest = 90%  Patient Saturations on Room Air while Ambulating = 91-96%   

## 2019-09-03 NOTE — Progress Notes (Signed)
IV ativan 2mg  returned to pyxis with charge nurse.

## 2019-09-04 LAB — GLUCOSE, CAPILLARY
Glucose-Capillary: 102 mg/dL — ABNORMAL HIGH (ref 70–99)
Glucose-Capillary: 153 mg/dL — ABNORMAL HIGH (ref 70–99)
Glucose-Capillary: 109 mg/dL — ABNORMAL HIGH (ref 70–99)
Glucose-Capillary: 175 mg/dL — ABNORMAL HIGH (ref 70–99)

## 2019-09-04 LAB — PHOSPHORUS: Phosphorus: 3.6 mg/dL (ref 2.5–4.6)

## 2019-09-04 LAB — BASIC METABOLIC PANEL
Anion gap: 11 (ref 5–15)
BUN: 29 mg/dL — ABNORMAL HIGH (ref 6–20)
CO2: 23 mmol/L (ref 22–32)
Calcium: 8.4 mg/dL — ABNORMAL LOW (ref 8.9–10.3)
Chloride: 105 mmol/L (ref 98–111)
Creatinine, Ser: 0.85 mg/dL (ref 0.44–1.00)
GFR calc Af Amer: >60 mL/min (ref >60)
GFR calc non Af Amer: >60 mL/min (ref >60)
Glucose, Bld: 230 mg/dL — ABNORMAL HIGH (ref 70–99)
Potassium: 3.9 mmol/L (ref 3.5–5.1)
Sodium: 139 mmol/L (ref 135–145)

## 2019-09-04 LAB — CBC
HCT: 45.2 % (ref 36.0–46.0)
Hemoglobin: 14.6 g/dL (ref 12.0–15.0)
MCH: 29 pg (ref 26.0–34.0)
MCHC: 32.3 g/dL (ref 30.0–36.0)
MCV: 89.9 fL (ref 80.0–100.0)
Platelets: 455 10*3/uL — ABNORMAL HIGH (ref 150–400)
RBC: 5.03 MIL/uL (ref 3.87–5.11)
RDW: 14.3 % (ref 11.5–15.5)
WBC: 9.8 10*3/uL (ref 4.0–10.5)
nRBC: 0 % (ref 0.0–0.2)

## 2019-09-04 LAB — CULTURE, BLOOD (ROUTINE X 2)
Culture: NO GROWTH
Culture: NO GROWTH
Special Requests: ADEQUATE

## 2019-09-04 LAB — MAGNESIUM: Magnesium: 2.1 mg/dL (ref 1.7–2.4)

## 2019-09-04 MED ORDER — FUROSEMIDE 10 MG/ML IJ SOLN
40.0000 mg | Freq: Once | INTRAMUSCULAR | Status: AC
  Administered 2019-09-04: 40 mg via INTRAVENOUS
  Filled 2019-09-04: qty 4

## 2019-09-04 NOTE — Progress Notes (Signed)
Physical Therapy Treatment Patient Details Name: [name] MRN: [id number] DOB: 04-17-1976 Today's Date: 09/04/2019    History of Present Illness 44 y.o. female with medical history significant for obesity, fibromyalgia,  chronic back pain from lumbar disc disease, multiple back surgeries, PE,  on chronic opiates and anxiety/depression. pt admitted with cough, SOB, and mild fever, Covid +    PT Comments    Progressing slowly with activity. Pt continues to fatigue and have dyspnea with activity. O2 93% on RA at rest, 88% on RA with ambulation. Dyspnea 2-3/4. Pt is generally in good spirits on today however she did become tearful when discussing losing her mother to [name] recently. She is hopeful to get home to her family but she doesn't want to leave too soon before she is really medically ready. Will continue to follow.     Follow Up Recommendations  Home health PT     Equipment Recommendations  None recommended by PT    Recommendations for Other Services       Precautions / Restrictions Precautions Precautions: Fall Precaution Comments: airborne; monitor O2 sats Restrictions Weight Bearing Restrictions: No    Mobility  Bed Mobility               General bed mobility comments: sitting EOB  Transfers Overall transfer level: Modified independent Equipment used: None                Ambulation/Gait Ambulation/Gait assistance: Min guard Gait Distance (Feet): 60 Feet(x2) Assistive device: None Gait Pattern/deviations: Step-through pattern;Decreased stride length     General Gait Details: slow gait. no lob. O2 88% on RA. dyspnea 2/4.   Stairs             Wheelchair Mobility    Modified Rankin (Stroke Patients Only)       Balance Overall balance assessment: Mild deficits observed, not formally tested                                          Cognition Arousal/Alertness: Awake/alert Behavior During Therapy: WFL for tasks  assessed/performed Overall Cognitive Status: Within Functional Limits for tasks assessed                                        Exercises      General Comments        Pertinent Vitals/Pain Pain Assessment: Faces Faces Pain Scale: Hurts even more Pain Location: back Pain Descriptors / Indicators: Discomfort;Sore;Aching Pain Intervention(s): Monitored during session    Home Living                      Prior Function            PT Goals (current goals can now be found in the care plan section) Progress towards PT goals: Progressing toward goals    Frequency    Min 3X/week      PT Plan Current plan remains appropriate    Co-evaluation              AM-PAC PT "6 Clicks" Mobility   Outcome Measure  Help needed turning from your back to your side while in a flat bed without using bedrails?: None Help needed moving from lying on your back to sitting on the side  of a flat bed without using bedrails?: None Help needed moving to and from a bed to a chair (including a wheelchair)?: A Little Help needed standing up from a chair using your arms (e.g., wheelchair or bedside chair)?: A Little Help needed to walk in hospital room?: A Little Help needed climbing 3-5 steps with a railing? : A Little 6 Click Score: 20    End of Session Equipment Utilized During Treatment: Gait belt Activity Tolerance: Patient limited by fatigue(limited by dyspnea) Patient left: in bed;with call bell/phone within reach   PT Visit Diagnosis: Difficulty in walking, not elsewhere classified (R26.2)     Time: 7583-0746 PT Time Calculation (min) (ACUTE ONLY): 41 min  Charges:  $Gait Training: 23-37 mins                     Faye Ramsay, PT Acute Rehabilitation       [facility] 09/04/2019, 12:14 PM

## 2019-09-04 NOTE — Progress Notes (Signed)
PROGRESS NOTE    [name]  TMH:[id number] DOB: 01-02-1976 DOA: 08/28/2019 PCP: Laurey Morale, MD    Brief Narrative:  Patient is a 44 year old female with history of obesity, chronic back pain from lumbar disc disease on chronic opioids, anxiety/depression, hypertension, GERD, presented to ED with trouble breathing.  Patient reported that her husband had tested positive for Covid last month.  Approximately 10 days ago she started noticing some mild symptoms such as cough and mild fevers.  In ED, patient was found to have O2 sats 87% on room air, placed on 2 L.  CRP 9.8, ferritin 1300, procalcitonin level mildly elevated at 0.52 but normal WBCs.   COVID-19 PCR positive   Assessment & Plan:   Principal Problem:   Acute respiratory failure with hypoxia (HCC) Active Problems:   Obesity (BMI 30-39.9)   ANXIETY DISORDER, GENERALIZED   Depression   Hereditary and idiopathic peripheral neuropathy   GERD (gastroesophageal reflux disease)   HTN (hypertension)   Spinal stenosis of lumbar region   DDD (degenerative disc disease), lumbar   Major depressive disorder with single episode, in partial remission (HCC)   Chronic pain   Acute respiratory disease due to COVID-19 virus  1 acute hypoxic respiratory failure secondary to acute COVID-19 viral pneumonia during ongoing COVID-19 pandemic, POA Patient had presented with shortness of breath, nausea fevers cough.  CT angiogram chest negative for PE however did show diffuse bilateral groundglass airspace opacities.  Patient noted to be hypoxic with ongoing requirement of 2 L nasal cannula.  Patient with significant shortness of breath on minimal exertion per patient and RN on 08/31/2019.  Patient improving clinically with diuresis.. Inflammatory markers trending down. Continue IV Decadron, s/p 5 days remdesivir.  Patient status post Actemra( 08/28/2019).  Continue to wean O2, vitamin C, zinc, albuterol, Tylenol.  Ambulatory O2 sats.  Continue  Flonase, Dulera, Combivent inhalers.  Patient placed on Lasix '40mg'$  IV daily (08/31/2019) with urine output of 1.8 L over the past 24 hours.  Patient is - 6.266 L during this hospitalization.  BNP elevated and trending down.  Continue IV Lasix and reassess in the morning.  Strict I's and O's.  Daily weights.  Supportive care.   2.  Nausea/history of GERD/ Likely exacerbated by Covid.  IV fluids have been saline locked.  Carafate and PPI.  Antiemetics as needed.  Outpatient follow-up with GI.    3.  Anxiety/depression Ativan as needed.  4.  History of spinal stenosis of the lumbar region/DDD/chronic pain syndrome/peripheral neuropathy Patient on IV pain medications due to persistent nausea.  Nausea improved.  Continue current home regimen of oxycodone 15 mg every 4 hours as needed, Dilaudid 8 mg every 6 hours as needed for severe pain.  IV morphine has been discontinued.  Outpatient follow-up.   5.  Hypertension BP stable.    6.  Obesity   DVT prophylaxis: Lovenox Code Status: Full Family Communication: Updated patient.  No family at bedside. Disposition Plan:  . Patient came from:Home           . Anticipated d/c place: Home . Barriers to d/c OR conditions which need to be met to effect a safe d/c: Home once improved clinically, improvement in hypoxia, tolerating diet, completion of IV remdesivir hopefully tomorrow.   Consultants:   None  Procedures:   CT angiogram chest 08/28/2019  Chest x-ray 08/28/2019    Antimicrobials:   None   Subjective: Patient laying in bed.  Few shortness of breath improving  however feels not at baseline yet.  Cough improved.  Tolerating current diet.   Objective: Vitals:   09/03/19 0515 09/03/19 1440 09/03/19 2023 09/04/19 0633  BP: 120/84 122/86 130/89 122/77  Pulse: (!) 55 98 70 (!) 59  Resp:      Temp: 98.1 F (36.7 C) 98.9 F (37.2 C) 99.5 F (37.5 C) 97.7 F (36.5 C)  TempSrc: Oral Oral Oral Oral  SpO2: 94% 98% 93% 93%  Weight:  119.5 kg   119.5 kg  Height:        Intake/Output Summary (Last 24 hours) at 09/04/2019 1226 Last data filed at 09/04/2019 1214 Gross per 24 hour  Intake 730 ml  Output 3500 ml  Net -2770 ml   Filed Weights   09/02/19 0517 09/03/19 0515 09/04/19 4967  Weight: 122.4 kg 119.5 kg 119.5 kg    Examination:  General exam: NAD. Respiratory system: Decreased breath sounds in the bases.  Minimal crackles noted.  Fair air movement.  Less tight.  Speaking in full sentences.  Normal respiratory effort.  No use of accessory muscles of respiration. Cardiovascular system: Regular rate and rhythm no murmurs rubs or gallops.  No JVD.  No lower extremity edema. Gastrointestinal system: Abdomen is soft, nontender, nondistended, positive bowel sounds.  No rebound.  No guarding.    Central nervous system: Alert and oriented. No focal neurological deficits. Extremities: Symmetric 5 x 5 power. Skin: No rashes, lesions or ulcers Psychiatry: Judgement and insight appear normal. Mood & affect appropriate.     Data Reviewed: I have personally reviewed following labs and imaging studies  CBC: Recent Labs  Lab 08/29/19 0420 08/29/19 0420 08/30/19 0334 08/30/19 0334 08/31/19 0328 09/01/19 0347 09/02/19 0400 09/03/19 0423 09/04/19 0428  WBC 2.2*   < > 3.1*   < > 5.6 5.1 5.8 9.7 9.8  NEUTROABS 1.3*  --  2.2  --  3.9 3.7 4.3  --   --   HGB 13.9   < > 13.6   < > 13.0 13.8 14.2 14.7 14.6  HCT 42.7   < > 42.4   < > 40.4 41.7 43.2 44.3 45.2  MCV 88.6   < > 89.3   < > 89.8 88.3 87.6 89.1 89.9  PLT 194   < > 264   < > 313 364 421* 459* 455*   < > = values in this interval not displayed.   Basic Metabolic Panel: Recent Labs  Lab 08/31/19 0328 09/01/19 0347 09/02/19 0400 09/03/19 0423 09/04/19 0428  NA 142 144 142 140 139  K 3.8 3.7 4.0 4.1 3.9  CL 108 106 104 104 105  CO2 '23 25 27 26 23  '$ GLUCOSE 160* 142* 183* 196* 230*  BUN 26* 21* 26* 25* 29*  CREATININE 0.80 0.70 0.83 0.99 0.85  CALCIUM 8.4*  8.3* 8.3* 8.3* 8.4*  MG  --  2.2 2.3 2.4 2.1  PHOS  --  4.2 3.9 3.8 3.6   GFR: Estimated Creatinine Clearance: 115.5 mL/min (by C-G formula based on SCr of 0.85 mg/dL). Liver Function Tests: Recent Labs  Lab 08/29/19 0420 08/30/19 0334 08/31/19 0328 09/01/19 0347 09/02/19 0400  AST 37 '25 22 29 25  '$ ALT 40 33 25 33 33  ALKPHOS 47 44 44 46 52  BILITOT 0.5 0.2* 0.3 0.3 0.5  PROT 6.8 6.6 6.0* 6.3* 6.2*  ALBUMIN 3.1* 3.0* 2.9* 3.2* 3.1*   No results for input(s): LIPASE, AMYLASE in the last 168 hours. No results for input(s):  AMMONIA in the last 168 hours. Coagulation Profile: No results for input(s): INR, PROTIME in the last 168 hours. Cardiac Enzymes: No results for input(s): CKTOTAL, CKMB, CKMBINDEX, TROPONINI in the last 168 hours. BNP (last 3 results) No results for input(s): PROBNP in the last 8760 hours. HbA1C: No results for input(s): HGBA1C in the last 72 hours. CBG: Recent Labs  Lab 09/03/19 1208 09/03/19 1615 09/03/19 2018 09/04/19 0756 09/04/19 1152  GLUCAP 169* 125* 138* 102* 153*   Lipid Profile: No results for input(s): CHOL, HDL, LDLCALC, TRIG, CHOLHDL, LDLDIRECT in the last 72 hours. Thyroid Function Tests: No results for input(s): TSH, T4TOTAL, FREET4, T3FREE, THYROIDAB in the last 72 hours. Anemia Panel: Recent Labs    09/02/19 0400  FERRITIN 417*   Sepsis Labs: Recent Labs  Lab 08/28/19 1300 08/28/19 1318 08/28/19 1525 08/29/19 0420  PROCALCITON  --  0.52  --  0.29  LATICACIDVEN 2.0*  --  1.5  --     Recent Results (from the past 240 hour(s))  Blood Culture (routine x 2)     Status: None (Preliminary result)   Collection Time: 08/28/19  1:17 PM   Specimen: BLOOD RIGHT HAND  Result Value Ref Range Status   Specimen Description   Final    BLOOD RIGHT HAND Performed at [facility], [place] [address] [place], [place] [address]    Special Requests   Final    BOTTLES DRAWN AEROBIC ONLY Blood Culture results may not be  optimal due to an inadequate volume of blood received in culture bottles Performed at [place] [facility] [address] [place], [place] [address]    Culture   Final    NO GROWTH 4 DAYS Performed at [facility], [place] [address] [place], [place] [address]    Report Status PENDING  Incomplete  Blood Culture (routine x 2)     Status: None (Preliminary result)   Collection Time: 08/28/19  1:18 PM   Specimen: BLOOD  Result Value Ref Range Status   Specimen Description   Final    BLOOD RIGHT ANTECUBITAL Performed at [facility] [address], [place], [place] [address]    Special Requests   Final    BOTTLES DRAWN AEROBIC AND ANAEROBIC Blood Culture adequate volume Performed at [facility] [address], [place], [place] [address]    Culture   Final    NO GROWTH 4 DAYS Performed at [facility], [place] [address], [place], [place] [address]    Report Status PENDING  Incomplete  Respiratory Panel by RT PCR (Flu A&B, Covid) - Nasopharyngeal Swab     Status: Abnormal   Collection Time: 08/28/19  3:06 PM   Specimen: Nasopharyngeal Swab  Result Value Ref Range Status   SARS Coronavirus 2 by RT PCR POSITIVE (A) NEGATIVE Final    Comment: RESULT CALLED TO, READ BACK BY AND VERIFIED WITH: PONDS,J. RN '@[id number]'$  ON 03.29.2021 BY COHEN,K (NOTE) SARS-CoV-2 target nucleic acids are DETECTED. SARS-CoV-2 RNA is generally detectable in upper respiratory specimens  during the acute phase of infection. Positive results are indicative of the presence of the identified virus, but do not rule out bacterial infection or co-infection with other pathogens not detected by the test. Clinical correlation with patient history and other diagnostic information is necessary to determine patient infection status. The expected result is Negative. Fact Sheet for Patients:  PinkCheek.be Fact Sheet for Healthcare  Providers: GravelBags.it This test is  not yet approved or cleared by the [place] and  has been authorized for detection and/or diagnosis of SARS-CoV-2 by FDA under an Emergency Use Authorization (EUA).  This EUA will remain in effect (meaning this test can be  used) for the duration of  the COVID-19 declaration under Section 564(b)(1) of the Act, 21 U.S.C. section 360bbb-3(b)(1), unless the authorization is terminated or revoked sooner.    Influenza A by PCR NEGATIVE NEGATIVE Final   Influenza B by PCR NEGATIVE NEGATIVE Final    Comment: (NOTE) The Xpert Xpress SARS-CoV-2/FLU/RSV assay is intended as an aid in  the diagnosis of influenza from Nasopharyngeal swab specimens and  should not be used as a sole basis for treatment. Nasal washings and  aspirates are unacceptable for Xpert Xpress SARS-CoV-2/FLU/RSV  testing. Fact Sheet for Patients: PinkCheek.be Fact Sheet for Healthcare Providers: GravelBags.it This test is not yet approved or cleared by the [place] FDA and  has been authorized for detection and/or diagnosis of SARS-CoV-2 by  FDA under an Emergency Use Authorization (EUA). This EUA will remain  in effect (meaning this test can be used) for the duration of the  Covid-19 declaration under Section 564(b)(1) of the Act, 21  U.S.C. section 360bbb-3(b)(1), unless the authorization is  terminated or revoked. Performed at [facility], [place] [address] [place], [place] [address]          Radiology Studies: No results found.      Scheduled Meds: . vitamin C  500 mg Oral Daily  . benzonatate  200 mg Oral TID  . dexamethasone (DECADRON) injection  6 mg Intravenous Q24H  . enoxaparin (LOVENOX) injection  60 mg Subcutaneous Q24H  . famotidine  40 mg Oral QHS  . feeding supplement (ENSURE ENLIVE)  237 mL Oral BID BM  . fluticasone  2 spray Each Nare  QPM  . gabapentin  300 mg Oral TID  . guaiFENesin  1,200 mg Oral BID  . insulin aspart  0-9 Units Subcutaneous TID WC  . Ipratropium-Albuterol  2 puff Inhalation TID  . loratadine  10 mg Oral Daily  . mouth rinse  15 mL Mouth Rinse BID  . mometasone-formoterol  2 puff Inhalation BID  . pantoprazole  40 mg Oral QAC breakfast  . sodium chloride flush  3 mL Intravenous Once  . sucralfate  1 g Oral TID WC & HS  . zinc sulfate  220 mg Oral Daily   Continuous Infusions:    LOS: 7 days    Time spent: 35 minutes    Irine Seal, MD [facility]   To contact the attending provider between 7A-7P or the covering provider during after hours 7P-7A, please log into the web site [email or web address] and access using universal [place] password for that web site. If you do not have the password, please call the hospital operator.  09/04/2019, 12:26 PM

## 2019-09-05 LAB — CBC
HCT: 47.4 % — ABNORMAL HIGH (ref 36.0–46.0)
Hemoglobin: 15.3 g/dL — ABNORMAL HIGH (ref 12.0–15.0)
MCH: 29.1 pg (ref 26.0–34.0)
MCHC: 32.3 g/dL (ref 30.0–36.0)
MCV: 90.1 fL (ref 80.0–100.0)
Platelets: 463 10*3/uL — ABNORMAL HIGH (ref 150–400)
RBC: 5.26 MIL/uL — ABNORMAL HIGH (ref 3.87–5.11)
RDW: 14.6 % (ref 11.5–15.5)
WBC: 10.1 10*3/uL (ref 4.0–10.5)
nRBC: 0 % (ref 0.0–0.2)

## 2019-09-05 LAB — GLUCOSE, CAPILLARY
Glucose-Capillary: 136 mg/dL — ABNORMAL HIGH (ref 70–99)
Glucose-Capillary: 120 mg/dL — ABNORMAL HIGH (ref 70–99)

## 2019-09-05 LAB — BASIC METABOLIC PANEL
Anion gap: 12 (ref 5–15)
BUN: 31 mg/dL — ABNORMAL HIGH (ref 6–20)
CO2: 25 mmol/L (ref 22–32)
Calcium: 8.7 mg/dL — ABNORMAL LOW (ref 8.9–10.3)
Chloride: 103 mmol/L (ref 98–111)
Creatinine, Ser: 0.97 mg/dL (ref 0.44–1.00)
GFR calc Af Amer: >60 mL/min (ref >60)
GFR calc non Af Amer: >60 mL/min (ref >60)
Glucose, Bld: 168 mg/dL — ABNORMAL HIGH (ref 70–99)
Potassium: 4.3 mmol/L (ref 3.5–5.1)
Sodium: 140 mmol/L (ref 135–145)

## 2019-09-05 LAB — MAGNESIUM: Magnesium: 2.4 mg/dL (ref 1.7–2.4)

## 2019-09-05 LAB — PHOSPHORUS: Phosphorus: 4.5 mg/dL (ref 2.5–4.6)

## 2019-09-05 LAB — BRAIN NATRIURETIC PEPTIDE: B Natriuretic Peptide: 71.4 pg/mL (ref 0.0–100.0)

## 2019-09-05 MED ORDER — BENZONATATE 200 MG PO CAPS: 200.0000 mg | ORAL_CAPSULE | Freq: Three times a day (TID) | ORAL | 0 refills | Status: AC

## 2019-09-05 MED ORDER — IPRATROPIUM-ALBUTEROL 20-100 MCG/ACT IN AERS: 2.0000 | INHALATION_SPRAY | Freq: Three times a day (TID) | RESPIRATORY_TRACT | 0 refills | Status: DC

## 2019-09-05 MED ORDER — DEXAMETHASONE 6 MG PO TABS: 6.0000 mg | ORAL_TABLET | Freq: Every day | ORAL | 0 refills | Status: AC

## 2019-09-05 MED ORDER — MOMETASONE FURO-FORMOTEROL FUM 100-5 MCG/ACT IN AERO: 2.0000 | INHALATION_SPRAY | Freq: Two times a day (BID) | RESPIRATORY_TRACT | 0 refills | Status: DC

## 2019-09-05 MED ORDER — GUAIFENESIN ER 600 MG PO TB12: 1200.0000 mg | ORAL_TABLET | Freq: Two times a day (BID) | ORAL | 0 refills | Status: AC

## 2019-09-05 MED ORDER — ASCORBIC ACID 500 MG PO TABS: 500.0000 mg | ORAL_TABLET | Freq: Every day | ORAL | Status: DC

## 2019-09-05 MED ORDER — BENGAY GREASELESS 10-15 % EX CREA: 1.0000 "application " | TOPICAL_CREAM | CUTANEOUS | 0 refills | Status: DC | PRN

## 2019-09-05 NOTE — Progress Notes (Signed)
Occupational Therapy Treatment Patient Details Name: [name] MRN: [id number] DOB: 05-12-1976 Today's Date: 09/05/2019    History of present illness 44 y.o. female with medical history significant for obesity, fibromyalgia,  chronic back pain from lumbar disc disease, multiple back surgeries, PE,  on chronic opiates and anxiety/depression. pt admitted with cough, SOB, and mild fever, Covid +   OT comments  Patient seated in recliner upon entry, just finished up PT session.  Patient demonstrates ability to complete functional mobility with supervision to/from restroom, modified independent for transfers and grooming with supervision.  Discussed energy conservation techniques to incorporate into daily routine, pt has purchased pulse ox to help monitor SpO2 and activity tolerance.  Will follow acutely. DC plan remains appropriate.    Follow Up Recommendations  No OT follow up;Supervision/Assistance - 24 hour    Equipment Recommendations  None recommended by OT    Recommendations for Other Services      Precautions / Restrictions Precautions Precautions: Fall Precaution Comments: airborne; monitor O2 sats Restrictions Weight Bearing Restrictions: No       Mobility Bed Mobility               General bed mobility comments: OOB in recliner upon entry   Transfers Overall transfer level: Modified independent Equipment used: None                  Balance Overall balance assessment: Mild deficits observed, not formally tested                                         ADL either performed or assessed with clinical judgement   ADL Overall ADL's : Needs assistance/impaired Eating/Feeding: Independent   Grooming: Wash/dry hands;Supervision/safety;Standing                   Toilet Transfer: Modified Independent;Ambulation Toilet Transfer Details (indicate cue type and reason): simulated to/from recliner          Functional mobility  during ADLs: Supervision/safety General ADL Comments: reviewed energy conservaton techinques      Vision       Perception     Praxis      Cognition Arousal/Alertness: Awake/alert Behavior During Therapy: WFL for tasks assessed/performed Overall Cognitive Status: Within Functional Limits for tasks assessed                                          Exercises     Shoulder Instructions       General Comments SpO2 maintained 90% or greater on RA; pt reports feeling anxious about going home but very excited.  Reviewed energy conservation techniques to incorporate into daily routines for ADLs and IADLS (specifically when taking a shower and prepping her meals).     Pertinent Vitals/ Pain       Pain Assessment: Faces Faces Pain Scale: Hurts a little bit Pain Location: back Pain Descriptors / Indicators: Discomfort;Sore;Aching Pain Intervention(s): Monitored during session  Home Living                                          Prior Functioning/Environment  Frequency  Min 2X/week        Progress Toward Goals  OT Goals(current goals can now be found in the care plan section)  Progress towards OT goals: Progressing toward goals  Acute Rehab OT Goals Patient Stated Goal: to feel better OT Goal Formulation: With patient  Plan Discharge plan remains appropriate;Frequency remains appropriate    Co-evaluation                 AM-PAC OT "6 Clicks" Daily Activity     Outcome Measure   Help from another person eating meals?: None Help from another person taking care of personal grooming?: A Little Help from another person toileting, which includes using toliet, bedpan, or urinal?: A Little Help from another person bathing (including washing, rinsing, drying)?: A Little Help from another person to put on and taking off regular upper body clothing?: A Little Help from another person to put on and taking off regular  lower body clothing?: A Little 6 Click Score: 19    End of Session    OT Visit Diagnosis: Unsteadiness on feet (R26.81);Muscle weakness (generalized) (M62.81)   Activity Tolerance Patient tolerated treatment well   Patient Left in chair;with call bell/phone within reach   Nurse Communication          Time: 1421-1440 OT Time Calculation (min): 19 min  Charges: OT General Charges $OT Visit: 1 Visit OT Treatments $Self Care/Home Management : 8-22 mins  Jolaine Artist, OT [place] Pager [phone] Office [place] 09/05/2019, 2:56 PM

## 2019-09-05 NOTE — Progress Notes (Signed)
Pt states husband running late from work to pick her up. Requests a snack while waiting, will eat her dinner and take carafate when she gets home. [place] sandwich, pudding, and crackers provided as snack.

## 2019-09-05 NOTE — Progress Notes (Signed)
D/C instructions reviewed w/ pt. Pt verbalizes understanding and all questions answered. Pt in possession of d/c packet, scripts, and all personal belongings. Pt has called her husband for ride home. Pt stable, ambulatory, and eager for d/c.

## 2019-09-05 NOTE — Plan of Care (Signed)
  Problem: Coping: Goal: Psychosocial and spiritual needs will be supported Outcome: Completed/Met   Problem: Respiratory: Goal: Will maintain a patent airway Outcome: Completed/Met Goal: Complications related to the disease process, condition or treatment will be avoided or minimized Outcome: Completed/Met

## 2019-09-05 NOTE — TOC Progression Note (Addendum)
Transition of Care [facility]) - Progression Note    Patient Details  Name: CHRYSTAL ZEIMET MRN: [id number] Date of Birth: 1975/10/21  Transition of Care [facility] CM/SW Contact  Geni Bers, RN Phone Number: 09/05/2019, 12:33 PM  Clinical Narrative:     Pt will not qualify for Home O2 with Sats not being lower that 88%. Spoke with pt to explain this information. Pt was okay with not going home with Home O2.   Expected Discharge Plan: Home/Self Care Barriers to Discharge: No Barriers Identified  Expected Discharge Plan and Services Expected Discharge Plan: Home/Self Care       Living arrangements for the past 2 months: Single Family Home                                       Social Determinants of Health (SDOH) Interventions    Readmission Risk Interventions No flowsheet data found.

## 2019-09-05 NOTE — Discharge Summary (Signed)
Physician Discharge Summary  PRINCE OLIVIER NGE:[id number] DOB: 18-Sep-1975 DOA: 08/28/2019  PCP: [name], MD  Admit date: 08/28/2019 Discharge date: 09/05/2019  Time spent: 55 minutes  Recommendations for Outpatient Follow-up:  1. Follow-up with [name], MD in 2 weeks.  Follow-up patient need a basic metabolic profile done to follow-up on electrolytes and renal function.   Discharge Diagnoses:  Principal Problem:   Acute respiratory failure with hypoxia (HCC) Active Problems:   Obesity (BMI 30-39.9)   ANXIETY DISORDER, GENERALIZED   Depression   Hereditary and idiopathic peripheral neuropathy   GERD (gastroesophageal reflux disease)   HTN (hypertension)   Spinal stenosis of lumbar region   DDD (degenerative disc disease), lumbar   Major depressive disorder with single episode, in partial remission (HCC)   Chronic pain   Acute respiratory disease due to COVID-19 virus   Discharge Condition: Stable and improved  Diet recommendation: Heart healthy  Filed Weights   09/03/19 0515 09/04/19 0633 09/05/19 0530  Weight: 119.5 kg 119.5 kg 121.2 kg    History of present illness:  HPI per Dr. Ernestene Kiel is a 44 y.o. female with medical history significant for obesity, chronic back pain from lumbar disc disease on chronic opiates and anxiety/depression whose husband tested positive for Covid last month.  Patient states that approximately 10 days prior to admission, she started noticing some mild symptoms such as cough and mild fever.  She had an outpatient PCR test for Covid done a few days ago which came back positive.  Over the last few days, patient's breathing had gotten a lot worse and she found herself gasping for breath.  She came to the emergency room for further evaluation.  ED Course: In the emergency room, patient found to have oxygen saturations around 87% on room air requiring 2 L.  Lab work noteworthy for ferritin of 1300, CRP of 9.8 and initial  lactic acid level of 2, although this is felt to be more from dehydration as patient reported nausea and esophagitis over the last week so she had been barely able to take any p.o.  Procalcitonin level mildly elevated at 0.52 but patient had normal white blood cell count.  Hospital Course:  1 acute hypoxic respiratory failure secondary to acute COVID-19 viral pneumonia during ongoing COVID-19 pandemic, POA Patient had presented with shortness of breath, nausea fevers cough.  CT angiogram chest negative for PE however did show diffuse bilateral groundglass airspace opacities.  Patient noted to be hypoxic with ongoing requirement of 2 L nasal cannula.  Patient with significant shortness of breath on minimal exertion per patient and RN on 08/31/2019.  Patient improved clinically with diuresis.. Inflammatory markers trended down.  Patient s/p 5 days remdesivir.  Patient status post Actemra( 08/28/2019).    Patient also placed on vitamin C and zinc as well as albuterol inhaler and Tylenol and IV Lasix..  Patient improved clinically with sats greater than 92% by day of discharge.  Patient will be discharged home in stable and improved condition.  Outpatient follow-up with pulmonary versus PCP.  Patient was discharged home on 1 more day of oral Decadron.  2.  Nausea/history of GERD/ Likely exacerbated by Covid.  IV fluids have been saline locked.  Patient placed on Carafate and PPI as well as antiemetics needed.  Patient improved clinically was tolerating a diet by day of discharge. Antiemetics as needed.  Outpatient follow-up with GI.    3.  Anxiety/depression Ativan as needed.  4.  History of spinal stenosis of the lumbar region/DDD/chronic pain syndrome/peripheral neuropathy Patient on IV pain medications due to persistent nausea.  Nausea improved.  Patient subsequently started on home regimen of oxycodone 15 mg every 6 hours and dose adjusted to every 4 hours as needed per her home regimen.  Patient also  placed on home regimen of Dilaudid 8 mg every 6 hours as needed severe pain.   Patient's pain was managed and controlled during the hospitalization.  Outpatient follow-up.    5.  Hypertension BP remained stable.  Patient's HCTZ was held for most of the hospitalization and resumed on day of discharge.  6.  Obesity  Procedures:  CT angiogram chest 08/28/2019  Chest x-ray 08/28/2019    Consultations:  None  Discharge Exam: Vitals:   09/05/19 0530 09/05/19 1559  BP: (!) 141/92 (!) 138/93  Pulse: (!) 59 98  Resp: 18 20  Temp: 98.7 F (37.1 C) (!) 97.4 F (36.3 C)  SpO2: 94%     General: NAD Cardiovascular: RRR Respiratory: Decreased breath sounds in the bases.  No wheezing.  Speaking in full sentences.  No use of accessory muscles of respiration.  Discharge Instructions   Discharge Instructions    Diet - low sodium heart healthy   Complete by: As directed    Discharge instructions   Complete by: As directed    ?   Person Under Monitoring Name: EVELEAN BIGLER  Location: [address] [place] [address]   Infection Prevention Recommendations for Individuals Confirmed to have, or Being Evaluated for, 2019 Novel Coronavirus (COVID-19) Infection Who Receive Care at Home  Individuals who are confirmed to have, or are being evaluated for, COVID-19 should follow the prevention steps below until a healthcare provider or local or state health department says they can return to normal activities.  Stay home except to get medical care You should restrict activities outside your home, except for getting medical care. Do not go to work, school, or public areas, and do not use public transportation or taxis.  Call ahead before visiting your doctor Before your medical appointment, call the healthcare provider and tell them that you have, or are being evaluated for, COVID-19 infection. This will help the healthcare provider's office take steps to keep other  people from getting infected. Ask your healthcare provider to call the local or state health department.  Monitor your symptoms Seek prompt medical attention if your illness is worsening (e.g., difficulty breathing). Before going to your medical appointment, call the healthcare provider and tell them that you have, or are being evaluated for, COVID-19 infection. Ask your healthcare provider to call the local or state health department.  Wear a facemask You should wear a facemask that covers your nose and mouth when you are in the same room with other people and when you visit a healthcare provider. People who live with or visit you should also wear a facemask while they are in the same room with you.  Separate yourself from other people in your home As much as possible, you should stay in a different room from other people in your home. Also, you should use a separate bathroom, if available.  Avoid sharing household items You should not share dishes, drinking glasses, cups, eating utensils, towels, bedding, or other items with other people in your home. After using these items, you should wash them thoroughly with soap and water.  Cover your coughs and sneezes Cover your mouth and nose with  a tissue when you cough or sneeze, or you can cough or sneeze into your sleeve. Throw used tissues in a lined trash can, and immediately wash your hands with soap and water for at least 20 seconds or use an alcohol-based hand rub.  Wash your [employer] your hands often and thoroughly with soap and water for at least 20 seconds. You can use an alcohol-based hand sanitizer if soap and water are not available and if your hands are not visibly dirty. Avoid touching your eyes, nose, and mouth with unwashed hands.   Prevention Steps for Caregivers and Household Members of Individuals Confirmed to have, or Being Evaluated for, COVID-19 Infection Being Cared for in the Home  If you live with, or provide  care at home for, a person confirmed to have, or being evaluated for, COVID-19 infection please follow these guidelines to prevent infection:  Follow healthcare provider's instructions Make sure that you understand and can help the patient follow any healthcare provider instructions for all care.  Provide for the patient's basic needs You should help the patient with basic needs in the home and provide support for getting groceries, prescriptions, and other personal needs.  Monitor the patient's symptoms If they are getting sicker, call his or her medical provider and tell them that the patient has, or is being evaluated for, COVID-19 infection. This will help the healthcare provider's office take steps to keep other people from getting infected. Ask the healthcare provider to call the local or state health department.  Limit the number of people who have contact with the patient If possible, have only one caregiver for the patient. Other household members should stay in another home or place of residence. If this is not possible, they should stay in another room, or be separated from the patient as much as possible. Use a separate bathroom, if available. Restrict visitors who do not have an essential need to be in the home.  Keep older adults, very young children, and other sick people away from the patient Keep older adults, very young children, and those who have compromised immune systems or chronic health conditions away from the patient. This includes people with chronic heart, lung, or kidney conditions, diabetes, and cancer.  Ensure good ventilation Make sure that shared spaces in the home have good air flow, such as from an air conditioner or an opened window, weather permitting.  Wash your hands often Wash your hands often and thoroughly with soap and water for at least 20 seconds. You can use an alcohol based hand sanitizer if soap and water are not available and if your hands  are not visibly dirty. Avoid touching your eyes, nose, and mouth with unwashed hands. Use disposable paper towels to dry your hands. If not available, use dedicated cloth towels and replace them when they become wet.  Wear a facemask and gloves Wear a disposable facemask at all times in the room and gloves when you touch or have contact with the patient's blood, body fluids, and/or secretions or excretions, such as sweat, saliva, sputum, nasal mucus, vomit, urine, or feces.  Ensure the mask fits over your nose and mouth tightly, and do not touch it during use. Throw out disposable facemasks and gloves after using them. Do not reuse. Wash your hands immediately after removing your facemask and gloves. If your personal clothing becomes contaminated, carefully remove clothing and launder. Wash your hands after handling contaminated clothing. Place all used disposable facemasks, gloves, and other waste  in a lined container before disposing them with other household waste. Remove gloves and wash your hands immediately after handling these items.  Do not share dishes, glasses, or other household items with the patient Avoid sharing household items. You should not share dishes, drinking glasses, cups, eating utensils, towels, bedding, or other items with a patient who is confirmed to have, or being evaluated for, COVID-19 infection. After the person uses these items, you should wash them thoroughly with soap and water.  Wash laundry thoroughly Immediately remove and wash clothes or bedding that have blood, body fluids, and/or secretions or excretions, such as sweat, saliva, sputum, nasal mucus, vomit, urine, or feces, on them. Wear gloves when handling laundry from the patient. Read and follow directions on labels of laundry or clothing items and detergent. In general, wash and dry with the warmest temperatures recommended on the label.  Clean all areas the individual has used often Clean all  touchable surfaces, such as counters, tabletops, doorknobs, bathroom fixtures, toilets, phones, keyboards, tablets, and bedside tables, every day. Also, clean any surfaces that may have blood, body fluids, and/or secretions or excretions on them. Wear gloves when cleaning surfaces the patient has come in contact with. Use a diluted bleach solution (e.g., dilute bleach with 1 part bleach and 10 parts water) or a household disinfectant with a label that says EPA-registered for coronaviruses. To make a bleach solution at home, add 1 tablespoon of bleach to 1 quart (4 cups) of water. For a larger supply, add  cup of bleach to 1 gallon (16 cups) of water. Read labels of cleaning products and follow recommendations provided on product labels. Labels contain instructions for safe and effective use of the cleaning product including precautions you should take when applying the product, such as wearing gloves or eye protection and making sure you have good ventilation during use of the product. Remove gloves and wash hands immediately after cleaning.  Monitor yourself for signs and symptoms of illness Caregivers and household members are considered close contacts, should monitor their health, and will be asked to limit movement outside of the home to the extent possible. Follow the monitoring steps for close contacts listed on the symptom monitoring form.   ? If you have additional questions, contact your local health department or call the epidemiologist on call at [phone] (available 24/7). ? This guidance is subject to change. For the most up-to-date guidance from CDC, please refer to their website: [employer]   Increase activity slowly   Complete by: As directed      Allergies as of 09/05/2019      Reactions   Gadolinium Derivatives Swelling, Other (See Comments)   Arm swelling, tingling, & redness. Radiologist Dr. Grace Isaac recommends  an injection of steroids, or 13-hour prep if non-emergent, if patient needs a gadolinium based contrast in the future.   Other Rash, Other (See Comments)   Glue or tape to seal up wound   Zoloft [sertraline Hcl] Other (See Comments)   Hair loss      Medication List    STOP taking these medications   doxycycline 100 MG capsule Commonly known as: VIBRAMYCIN   fluconazole 150 MG tablet Commonly known as: DIFLUCAN   pramipexole 1 MG tablet Commonly known as: Mirapex     TAKE these medications   albuterol 108 (90 Base) MCG/ACT inhaler Commonly known as: VENTOLIN HFA Inhale 2 puffs into the lungs every 4 (four) hours as needed for wheezing or shortness of breath.  ALPRAZolam 0.5 MG tablet Commonly known as: XANAX Take 0.5 mg by mouth 3 (three) times daily as needed for anxiety.   ascorbic acid 500 MG tablet Commonly known as: VITAMIN C Take 1 tablet (500 mg total) by mouth daily. Start taking on: September 06, 2019   B-D 3CC LUER-LOK SYR 25GX1" 25G X 1" 3 ML Misc Generic drug: SYRINGE-NEEDLE (DISP) 3 ML USE AS DIRECTED   BEN GAY GREASELESS 10-15 % greaseless cream Apply 1 application topically as needed (for muscle pain).   benzonatate 200 MG capsule Commonly known as: TESSALON Take 1 capsule (200 mg total) by mouth 3 (three) times daily for 5 days.   calcium carbonate 500 MG chewable tablet Commonly known as: TUMS - dosed in mg elemental calcium Chew 1 tablet by mouth 2 (two) times daily as needed for indigestion or heartburn.   cetirizine 10 MG tablet Commonly known as: ZYRTEC Take 10 mg by mouth daily.   chlorhexidine 0.12 % solution Commonly known as: PERIDEX Use as directed 15 mLs in the mouth or throat 2 (two) times daily.   cyanocobalamin 1000 MCG/ML injection Commonly known as: (VITAMIN B-12) INJECT 1 ML (1,000 MCG TOTAL) INTO THE MUSCLE EVERY 7 DAYS. What changed: See the new instructions.   dexamethasone 6 MG tablet Commonly known as: DECADRON Take 1  tablet (6 mg total) by mouth daily for 1 day. Start taking on: September 06, 2019   diazepam 5 MG tablet Commonly known as: Valium Take 1 tablet (5 mg total) by mouth every 8 (eight) hours as needed for muscle spasms (muscle spasm/pain).   famotidine 40 MG tablet Commonly known as: PEPCID TAKE 1 TABLET BY MOUTH EVERYDAY AT BEDTIME What changed: See the new instructions.   fluticasone 50 MCG/ACT nasal spray Commonly known as: FLONASE Place 2 sprays into both nostrils every evening.   gabapentin 300 MG capsule Commonly known as: NEURONTIN TAKE 1 CAPSULE BY MOUTH THREE TIMES A DAY What changed:   See the new instructions.  Another medication with the same name was removed. Continue taking this medication, and follow the directions you see here.   guaiFENesin 600 MG 12 hr tablet Commonly known as: MUCINEX Take 2 tablets (1,200 mg total) by mouth 2 (two) times daily for 5 days.   hydrochlorothiazide 25 MG tablet Commonly known as: HYDRODIURIL TAKE 1 TABLET BY MOUTH EVERY DAY   HYDROmorphone 8 MG tablet Commonly known as: DILAUDID Take 1 tablet (8 mg total) by mouth every 6 (six) hours as needed for severe pain. Start taking on: September 18, 2019   Ipratropium-Albuterol 20-100 MCG/ACT Aers respimat Commonly known as: COMBIVENT Inhale 2 puffs into the lungs 3 (three) times daily for 5 days.   mometasone-formoterol 100-5 MCG/ACT Aero Commonly known as: DULERA Inhale 2 puffs into the lungs 2 (two) times daily for 5 days. Use for 5 days then stop.   MULTI-VITAMIN GUMMIES PO Take 2 tablets by mouth daily.   nitrofurantoin (macrocrystal-monohydrate) 100 MG capsule Commonly known as: MACROBID Take 100 mg by mouth daily as needed (for uti prophylaxis, after intercourse).   oxyCODONE 15 MG immediate release tablet Commonly known as: ROXICODONE Take 1 tablet (15 mg total) by mouth every 4 (four) hours as needed for pain. Start taking on: September 18, 2019   pantoprazole 40 MG  tablet Commonly known as: PROTONIX Take 1 tablet (40 mg total) by mouth every morning. Take 30 minutes before breakfast.   promethazine 25 MG tablet Commonly known as: PHENERGAN TAKE 1  TABLET BY MOUTH EVERY 6 HOURS AS NEEDED FOR NAUSEA What changed:   reasons to take this  additional instructions   sucralfate 1 g tablet Commonly known as: Carafate Take 1 tablet (1 g total) by mouth 4 (four) times daily. What changed: when to take this   SUMAtriptan 100 MG tablet Commonly known as: IMITREX Take 1 tablet earliest onset of migraine.  May repeat once in 2 hours if headache persists or recurs. What changed:   how much to take  how to take this  when to take this  reasons to take this  additional instructions   Vitamin D (Ergocalciferol) 1.25 MG (50000 UNIT) Caps capsule Commonly known as: DRISDOL TAKE 1 CAPSULE (50,000 UNITS TOTAL) BY MOUTH EVERY 7 (SEVEN) DAYS.      Allergies  Allergen Reactions  . Gadolinium Derivatives Swelling and Other (See Comments)    Arm swelling, tingling, & redness. Radiologist Dr. Grace IsaacWatts recommends an injection of steroids, or 13-hour prep if non-emergent, if patient needs a gadolinium based contrast in the future.  . Other Rash and Other (See Comments)    Glue or tape to seal up wound  . Zoloft [Sertraline Hcl] Other (See Comments)    Hair loss   Follow-up Information    Nelwyn SalisburyFry, [name], MD. Schedule an appointment as soon as possible for a visit in 2 week(s).   Specialty: Family Medicine Contact information: [address] [place] [id number] [phone]            The results of significant diagnostics from this hospitalization (including imaging, microbiology, ancillary and laboratory) are listed below for reference.    Significant Diagnostic Studies: DG Thoracic Spine 2 View  Result Date: 08/21/2019 CLINICAL DATA:  Back pain radiating to bilateral lower extremities, no history of injury, recent COVID-19  vaccination EXAM: THORACIC SPINE 2 VIEWS; LUMBAR SPINE - COMPLETE 4+ VIEW COMPARISON:  08/07/2019 FINDINGS: Thoracic spine: Frontal and lateral views of the thoracic spine demonstrates anatomic alignment. There are no fractures. Disc spaces are well preserved. Paraspinal soft tissues are normal. Lumbar spine: Frontal, bilateral oblique, lateral views of the lumbar spine are obtained. Continued postsurgical changes from laminectomy, posterior fusion, and discectomy spanning L3 through S1. No change in positioning of the orthopedic hardware. No evidence of metallic hardware failure or loosening. No fractures. Nonsurgical disc spaces are well preserved. Paraspinal soft tissues are normal. IMPRESSION: 1. No acute fracture within the thoracic and lumbar spine. 2. Stable postsurgical changes from L3 through S1. Electronically Signed   By: Sharlet SalinaMichael  Brown M.D.   On: 08/21/2019 16:15   DG Lumbar Spine Complete  Result Date: 08/21/2019 CLINICAL DATA:  Back pain radiating to bilateral lower extremities, no history of injury, recent COVID-19 vaccination EXAM: THORACIC SPINE 2 VIEWS; LUMBAR SPINE - COMPLETE 4+ VIEW COMPARISON:  08/07/2019 FINDINGS: Thoracic spine: Frontal and lateral views of the thoracic spine demonstrates anatomic alignment. There are no fractures. Disc spaces are well preserved. Paraspinal soft tissues are normal. Lumbar spine: Frontal, bilateral oblique, lateral views of the lumbar spine are obtained. Continued postsurgical changes from laminectomy, posterior fusion, and discectomy spanning L3 through S1. No change in positioning of the orthopedic hardware. No evidence of metallic hardware failure or loosening. No fractures. Nonsurgical disc spaces are well preserved. Paraspinal soft tissues are normal. IMPRESSION: 1. No acute fracture within the thoracic and lumbar spine. 2. Stable postsurgical changes from L3 through S1. Electronically Signed   By: Sharlet SalinaMichael  Brown M.D.   On: 08/21/2019 16:15  CT  ANGIO CHEST PE W OR WO CONTRAST  Result Date: 08/28/2019 CLINICAL DATA:  Respiratory failure.  COVID-19 positive. EXAM: CT ANGIOGRAPHY CHEST WITH CONTRAST TECHNIQUE: Multidetector CT imaging of the chest was performed using the standard protocol during bolus administration of intravenous contrast. Multiplanar CT image reconstructions and MIPs were obtained to evaluate the vascular anatomy. CONTRAST:  OMNIPAQUE IOHEXOL 350 MG/ML SOLN COMPARISON:  June 17, 2018. FINDINGS: Cardiovascular: Evaluation is limited by respiratory motion artifact.Given this limitation, no acute pulmonary embolism was detected. The main pulmonary artery is within normal limits for size. There is no CT evidence of acute right heart strain. The visualized aorta is normal. Heart size is normal, without pericardial effusion. Mediastinum/Nodes: --No mediastinal or hilar lymphadenopathy. --No axillary lymphadenopathy. --No supraclavicular lymphadenopathy. --Normal thyroid gland. --The esophagus is unremarkable Lungs/Pleura: Diffuse bilateral ground-glass airspace opacities are noted, consistent with the patient's history of viral pneumonia. There is no pneumothorax. No large pleural effusion. The trachea is unremarkable. Upper Abdomen: There is diffuse hepatic steatosis. Musculoskeletal: No chest wall abnormality. No acute or significant osseous findings. Review of the MIP images confirms the above findings. IMPRESSION: 1. Evaluation for pulmonary emboli is limited by respiratory motion artifact. Given this limitation, no acute pulmonary embolism was detected. 2. Diffuse bilateral ground-glass airspace opacities are noted, consistent with the patient's history of viral pneumonia. 3. Hepatic steatosis. Electronically Signed   By: Katherine Mantle M.D.   On: 08/28/2019 23:30   CT LUMBAR SPINE WO CONTRAST  Result Date: 08/07/2019 CLINICAL DATA:  Left-sided back pain and sciatica EXAM: CT LUMBAR SPINE WITHOUT CONTRAST TECHNIQUE:  Multidetector CT imaging of the lumbar spine was performed without intravenous contrast administration. Multiplanar CT image reconstructions were also generated. COMPARISON:  10/12/2018 FINDINGS: Segmentation: 5 lumbar type vertebrae Alignment: Normal Vertebrae: L3-4 and L4-5 PLIF and broad laminectomy. Arthrodesis is solid and hardware is in place. L5-S1 ALIF and posterior lumbar rod and pedicle screw fixation. Bony density at the intervertebral graft is less dense than before and arthrodesis status is uncertain at this location. There is subtle lucency around the S1 screws which is unchanged. There is some progressive spurring about the L5-S1 interbody spacer, especially at the L5 inferior endplate, suggesting ongoing motion but there does appear to be bridging bone at the posterolateral fusion, especially on the right. Paraspinal and other soft tissues: Expected postoperative scarring. Disc levels: T12- L1: Unremarkable. L1-L2: Minor facet spurring.  No visible herniation or impingement L2-L3: Minor facet spurring.  No visible herniation or impingement L3-L4: PLIF with solid arthrodesis.  No visible impingement L4-L5: PLIF with solid arthrodesis.  No visible impingement L5-S1:Fusion as described above. IMPRESSION: 1. L5-S1 ALIF with uncertain interbody arthrodesis but there does appear to be bridging posterior-lateral bony graft. 2. L3-4 and L4-5 solid interbody arthrodesis. 3. No visible impingement. 4. 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 Electronically Signed   By: Marnee Spring M.D.   On: 08/07/2019 10:50   DG Chest Port 1 View  Result Date: 08/28/2019 CLINICAL DATA:  Increased shortness of breath with oxygen desaturation. Recent COVID-19 infection. EXAM: PORTABLE CHEST 1 VIEW COMPARISON:  Radiographs 06/21/2018 and 06/17/2018. CT 06/17/2018. FINDINGS: 1345 hours. There are lower lung volumes with asymmetric elevation of the right hemidiaphragm. The heart size and mediastinal contours are stable and within normal  limits for body habitus. There are increased right greater than left perihilar opacities which may reflect atelectasis or atypical inflammation. No consolidation, pleural effusion or pneumothorax. The bones appear unchanged. IMPRESSION: Worsening right greater than left  perihilar opacities consistent with worsening atelectasis or atypical inflammation such as viral pneumonia. Electronically Signed   By: Carey Bullocks M.D.   On: 08/28/2019 14:01    Microbiology: Recent Results (from the past 240 hour(s))  Blood Culture (routine x 2)     Status: None   Collection Time: 08/28/19  1:17 PM   Specimen: BLOOD RIGHT HAND  Result Value Ref Range Status   Specimen Description   Final    BLOOD RIGHT HAND Performed at [facility], [address], [place], [place] [address]    Special Requests   Final    BOTTLES DRAWN AEROBIC ONLY Blood Culture results may not be optimal due to an inadequate volume of blood received in culture bottles Performed at [facility], [address], [place], [place] [address]    Culture   Final    NO GROWTH 5 DAYS Performed at [facility], [address], [place], [place] [address]    Report Status 09/04/2019 FINAL  Final  Blood Culture (routine x 2)     Status: None   Collection Time: 08/28/19  1:18 PM   Specimen: BLOOD  Result Value Ref Range Status   Specimen Description   Final    BLOOD RIGHT ANTECUBITAL Performed at [facility], [address] [place], [place] [address]    Special Requests   Final    BOTTLES DRAWN AEROBIC AND ANAEROBIC Blood Culture adequate volume Performed at [facility], [address], [place], [place] [address]    Culture   Final    NO GROWTH 5 DAYS Performed at [facility], [address], [place], [place] [address]    Report Status 09/04/2019 FINAL  Final  Respiratory Panel by RT PCR (Flu A&B, Covid) - Nasopharyngeal Swab     Status:  Abnormal   Collection Time: 08/28/19  3:06 PM   Specimen: Nasopharyngeal Swab  Result Value Ref Range Status   SARS Coronavirus 2 by RT PCR POSITIVE (A) NEGATIVE Final    Comment: RESULT CALLED TO, READ BACK BY AND VERIFIED WITH: PONDS,J. RN @1814  ON 03.29.2021 BY COHEN,K (NOTE) SARS-CoV-2 target nucleic acids are DETECTED. SARS-CoV-2 RNA is generally detectable in upper respiratory specimens  during the acute phase of infection. Positive results are indicative of the presence of the identified virus, but do not rule out bacterial infection or co-infection with other pathogens not detected by the test. Clinical correlation with patient history and other diagnostic information is necessary to determine patient infection status. The expected result is Negative. Fact Sheet for Patients:  03.31.2021 Fact Sheet for Healthcare Providers: https://www.moore.com/ This test is not yet approved or cleared by the https://www.young.biz/ FDA and  has been authorized for detection and/or diagnosis of SARS-CoV-2 by FDA under an Emergency Use Authorization (EUA).  This EUA will remain in effect (meaning this test can be  used) for the duration of  the COVID-19 declaration under Section 564(b)(1) of the Act, 21 U.S.C. section 360bbb-3(b)(1), unless the authorization is terminated or revoked sooner.    Influenza A by PCR NEGATIVE NEGATIVE Final   Influenza B by PCR NEGATIVE NEGATIVE Final    Comment: (NOTE) The Xpert Xpress SARS-CoV-2/FLU/RSV assay is intended as an aid in  the diagnosis of influenza from Nasopharyngeal swab specimens and  should not be used as a sole basis for treatment. Nasal washings and  aspirates are unacceptable for Xpert Xpress SARS-CoV-2/FLU/RSV  testing. Fact Sheet  for Patients: PinkCheek.be Fact Sheet for Healthcare Providers: GravelBags.it This test is not yet approved or  cleared by the [place] FDA and  has been authorized for detection and/or diagnosis of SARS-CoV-2 by  FDA under an Emergency Use Authorization (EUA). This EUA will remain  in effect (meaning this test can be used) for the duration of the  Covid-19 declaration under Section 564(b)(1) of the Act, 21  U.S.C. section 360bbb-3(b)(1), unless the authorization is  terminated or revoked. Performed at [facility], [place] [address] [place], [place] [address]      Labs: Basic Metabolic Panel: Recent Labs  Lab 09/01/19 0347 09/02/19 0400 09/03/19 0423 09/04/19 0428 09/05/19 0412  NA 144 142 140 139 140  K 3.7 4.0 4.1 3.9 4.3  CL 106 104 104 105 103  CO2 25 27 26 23 25   GLUCOSE 142* 183* 196* 230* 168*  BUN 21* 26* 25* 29* 31*  CREATININE 0.70 0.83 0.99 0.85 0.97  CALCIUM 8.3* 8.3* 8.3* 8.4* 8.7*  MG 2.2 2.3 2.4 2.1 2.4  PHOS 4.2 3.9 3.8 3.6 4.5   Liver Function Tests: Recent Labs  Lab 08/30/19 0334 08/31/19 0328 09/01/19 0347 09/02/19 0400  AST 25 22 29 25   ALT 33 25 33 33  ALKPHOS 44 44 46 52  BILITOT 0.2* 0.3 0.3 0.5  PROT 6.6 6.0* 6.3* 6.2*  ALBUMIN 3.0* 2.9* 3.2* 3.1*   No results for input(s): LIPASE, AMYLASE in the last 168 hours. No results for input(s): AMMONIA in the last 168 hours. CBC: Recent Labs  Lab 08/30/19 0334 08/30/19 0334 08/31/19 0328 08/31/19 0328 09/01/19 0347 09/02/19 0400 09/03/19 0423 09/04/19 0428 09/05/19 0412  WBC 3.1*   < > 5.6   < > 5.1 5.8 9.7 9.8 10.1  NEUTROABS 2.2  --  3.9  --  3.7 4.3  --   --   --   HGB 13.6   < > 13.0   < > 13.8 14.2 14.7 14.6 15.3*  HCT 42.4   < > 40.4   < > 41.7 43.2 44.3 45.2 47.4*  MCV 89.3   < > 89.8   < > 88.3 87.6 89.1 89.9 90.1  PLT 264   < > 313   < > 364 421* 459* 455* 463*   < > = values in this interval not displayed.   Cardiac Enzymes: No results for input(s): CKTOTAL, CKMB, CKMBINDEX, TROPONINI in the last 168 hours. BNP: BNP (last 3 results) Recent Labs     09/01/19 0347 09/03/19 0423 09/05/19 1242  BNP 122.6* 43.0 71.4    ProBNP (last 3 results) No results for input(s): PROBNP in the last 8760 hours.  CBG: Recent Labs  Lab 09/04/19 1152 09/04/19 1621 09/04/19 2059 09/05/19 0804 09/05/19 1201  GLUCAP 153* 109* 175* 136* 120*       Signed:  Irine Seal MD.  [facility] 09/05/2019, 4:10 PM

## 2019-09-05 NOTE — Plan of Care (Signed)
  Problem: Coping: Goal: Psychosocial and spiritual needs will be supported Outcome: Completed/Met   Problem: Respiratory: Goal: Complications related to the disease process, condition or treatment will be avoided or minimized Outcome: Completed/Met

## 2019-09-05 NOTE — Progress Notes (Signed)
Pt showered w/ MD order. Pt independent w/ shower. Did have some DOE but maintained O2 sat at 96% with activity.

## 2019-09-05 NOTE — TOC Progression Note (Signed)
Transition of Care [facility]) - Progression Note    Patient Details  Name: [name] MRN: [id number] Date of Birth: April 29, 1976  Transition of [facility]) CM/SW Contact  Geni Bers, RN Phone Number: 09/05/2019, 4:03 PM  Clinical Narrative:    Pt states that she has PT at home for her back and will continue.  CM signing off.   Expected Discharge Plan: Home/Self Care Barriers to Discharge: No Barriers Identified  Expected Discharge Plan and Services Expected Discharge Plan: Home/Self Care       Living arrangements for the past 2 months: Single Family Home Expected Discharge Date: 09/05/19                                     Social Determinants of Health (SDOH) Interventions    Readmission Risk Interventions No flowsheet data found.

## 2019-09-05 NOTE — Progress Notes (Signed)
Physical Therapy Treatment Patient Details Name: [name] MRN: [id number] DOB: 1976-05-17 Today's Date: 09/05/2019    History of Present Illness 44 y.o. female with medical history significant for obesity, fibromyalgia,  chronic back pain from lumbar disc disease, multiple back surgeries, PE,  on chronic opiates and anxiety/depression. pt admitted with cough, SOB, and mild fever, Covid +    PT Comments    Pt ambulated in room and mostly limited by back pain (reports poor healing of recent back surgery and was attempting to get into aquatherapy).  Pt also performed a few seated exercises.   Pt eager to return home today and awaiting lunch.   Follow Up Recommendations  Home health PT     Equipment Recommendations  None recommended by PT    Recommendations for Other Services       Precautions / Restrictions Precautions Precautions: Fall Precaution Comments: airborne; monitor O2 sats Restrictions Weight Bearing Restrictions: No    Mobility  Bed Mobility               General bed mobility comments: pt in recliner on arrival  Transfers Overall transfer level: Modified independent Equipment used: None                Ambulation/Gait Ambulation/Gait assistance: Supervision Gait Distance (Feet): 60 Feet(total) Assistive device: None Gait Pattern/deviations: Step-through pattern;Decreased stride length Gait velocity: decr   General Gait Details: SpO2 91% on room air after ambulating; dyspnea 2/4, mobility more limited by back pain per pt   Stairs             Wheelchair Mobility    Modified Rankin (Stroke Patients Only)       Balance Overall balance assessment: Mild deficits observed, not formally tested                                          Cognition Arousal/Alertness: Awake/alert Behavior During Therapy: WFL for tasks assessed/performed Overall Cognitive Status: Within Functional Limits for tasks assessed                                         Exercises General Exercises - Lower Extremity Ankle Circles/Pumps: AROM;10 reps;Both;Seated Long Arc Quad: AROM;Both;10 reps;Seated Hip Flexion/Marching: AROM;Both;10 reps;Seated    General Comments General comments (skin integrity, edema, etc.): SpO2 maintained 90% or greater on RA; pt reports feeling anxious about going home but very excited.  Reviewed energy conservation techniques to incorporate into daily routines for ADLs and IADLS (specifically when taking a shower and prepping her meals).       Pertinent Vitals/Pain Pain Assessment: Faces Faces Pain Scale: Hurts a little bit Pain Location: back Pain Descriptors / Indicators: Discomfort;Sore;Aching Pain Intervention(s): Repositioned;Monitored during session    Home Living                      Prior Function            PT Goals (current goals can now be found in the care plan section) Acute Rehab PT Goals Patient Stated Goal: to feel better Progress towards PT goals: Progressing toward goals    Frequency    Min 3X/week      PT Plan Current plan remains appropriate    Co-evaluation  AM-PAC PT "6 Clicks" Mobility   Outcome Measure  Help needed turning from your back to your side while in a flat bed without using bedrails?: None Help needed moving from lying on your back to sitting on the side of a flat bed without using bedrails?: None Help needed moving to and from a bed to a chair (including a wheelchair)?: None Help needed standing up from a chair using your arms (e.g., wheelchair or bedside chair)?: None Help needed to walk in hospital room?: A Little Help needed climbing 3-5 steps with a railing? : A Little 6 Click Score: 22    End of Session   Activity Tolerance: Patient tolerated treatment well Patient left: in chair;with call bell/phone within reach(with OT)   PT Visit Diagnosis: Difficulty in walking, not elsewhere  classified (R26.2)     Time: 9794-8016 PT Time Calculation (min) (ACUTE ONLY): 20 min  Charges:  $Gait Training: 8-22 mins                     Arlyce Dice, DPT Acute Rehabilitation Services Office: [place] [facility] 09/05/2019, 4:10 PM

## 2019-09-05 NOTE — Progress Notes (Signed)
Pt O2 sat on RA at rest is 96%. Pt ambulated around her room on RA with O2 sat ranging from 92-98%. Pt did have some DOE but resolved once pt sat down and recovered.

## 2019-09-06 ENCOUNTER — Ambulatory Visit: Payer: BC Managed Care – PPO | Admitting: Licensed Clinical Social Worker

## 2019-09-06 ENCOUNTER — Encounter (INDEPENDENT_AMBULATORY_CARE_PROVIDER_SITE_OTHER): Payer: Self-pay

## 2019-09-07 ENCOUNTER — Encounter (INDEPENDENT_AMBULATORY_CARE_PROVIDER_SITE_OTHER): Payer: Self-pay

## 2019-09-08 ENCOUNTER — Encounter (INDEPENDENT_AMBULATORY_CARE_PROVIDER_SITE_OTHER): Payer: Self-pay

## 2019-09-10 ENCOUNTER — Encounter (INDEPENDENT_AMBULATORY_CARE_PROVIDER_SITE_OTHER): Payer: Self-pay

## 2019-09-11 ENCOUNTER — Other Ambulatory Visit: Payer: Self-pay

## 2019-09-11 ENCOUNTER — Emergency Department (HOSPITAL_COMMUNITY): Payer: BC Managed Care – PPO

## 2019-09-11 ENCOUNTER — Encounter (HOSPITAL_COMMUNITY): Payer: Self-pay

## 2019-09-11 ENCOUNTER — Inpatient Hospital Stay (HOSPITAL_COMMUNITY)
Admission: EM | Admit: 2019-09-11 | Discharge: 2019-09-15 | DRG: 177 | Disposition: A | Discharge status: Home / Self Care | Payer: BC Managed Care – PPO | Attending: Internal Medicine | Admitting: Internal Medicine

## 2019-09-11 DIAGNOSIS — Z79899 Other long term (current) drug therapy: Secondary | ICD-10-CM | POA: Diagnosis not present

## 2019-09-11 DIAGNOSIS — R0602 Shortness of breath: Secondary | ICD-10-CM | POA: Diagnosis not present

## 2019-09-11 DIAGNOSIS — I493 Ventricular premature depolarization: Secondary | ICD-10-CM | POA: Diagnosis present

## 2019-09-11 DIAGNOSIS — Z8249 Family history of ischemic heart disease and other diseases of the circulatory system: Secondary | ICD-10-CM | POA: Diagnosis not present

## 2019-09-11 DIAGNOSIS — Z91048 Other nonmedicinal substance allergy status: Secondary | ICD-10-CM | POA: Diagnosis not present

## 2019-09-11 DIAGNOSIS — J9601 Acute respiratory failure with hypoxia: Secondary | ICD-10-CM | POA: Diagnosis present

## 2019-09-11 DIAGNOSIS — Z6838 Body mass index (BMI) 38.0-38.9, adult: Secondary | ICD-10-CM | POA: Diagnosis not present

## 2019-09-11 DIAGNOSIS — B370 Candidal stomatitis: Secondary | ICD-10-CM | POA: Diagnosis not present

## 2019-09-11 DIAGNOSIS — M48061 Spinal stenosis, lumbar region without neurogenic claudication: Secondary | ICD-10-CM | POA: Diagnosis present

## 2019-09-11 DIAGNOSIS — J988 Other specified respiratory disorders: Secondary | ICD-10-CM | POA: Diagnosis not present

## 2019-09-11 DIAGNOSIS — F329 Major depressive disorder, single episode, unspecified: Secondary | ICD-10-CM | POA: Diagnosis present

## 2019-09-11 DIAGNOSIS — G894 Chronic pain syndrome: Secondary | ICD-10-CM | POA: Diagnosis present

## 2019-09-11 DIAGNOSIS — F32A Depression, unspecified: Secondary | ICD-10-CM | POA: Diagnosis present

## 2019-09-11 DIAGNOSIS — F324 Major depressive disorder, single episode, in partial remission: Secondary | ICD-10-CM | POA: Diagnosis not present

## 2019-09-11 DIAGNOSIS — R944 Abnormal results of kidney function studies: Secondary | ICD-10-CM | POA: Diagnosis present

## 2019-09-11 DIAGNOSIS — J1282 Pneumonia due to coronavirus disease 2019: Secondary | ICD-10-CM | POA: Diagnosis present

## 2019-09-11 DIAGNOSIS — G8929 Other chronic pain: Secondary | ICD-10-CM | POA: Diagnosis present

## 2019-09-11 DIAGNOSIS — Z888 Allergy status to other drugs, medicaments and biological substances status: Secondary | ICD-10-CM

## 2019-09-11 DIAGNOSIS — U071 COVID-19: Secondary | ICD-10-CM

## 2019-09-11 DIAGNOSIS — F411 Generalized anxiety disorder: Secondary | ICD-10-CM | POA: Diagnosis not present

## 2019-09-11 DIAGNOSIS — Z91041 Radiographic dye allergy status: Secondary | ICD-10-CM

## 2019-09-11 DIAGNOSIS — I10 Essential (primary) hypertension: Secondary | ICD-10-CM | POA: Diagnosis present

## 2019-09-11 DIAGNOSIS — M797 Fibromyalgia: Secondary | ICD-10-CM | POA: Diagnosis present

## 2019-09-11 DIAGNOSIS — M51369 Other intervertebral disc degeneration, lumbar region without mention of lumbar back pain or lower extremity pain: Secondary | ICD-10-CM | POA: Diagnosis present

## 2019-09-11 DIAGNOSIS — K219 Gastro-esophageal reflux disease without esophagitis: Secondary | ICD-10-CM | POA: Diagnosis present

## 2019-09-11 DIAGNOSIS — Z87891 Personal history of nicotine dependence: Secondary | ICD-10-CM | POA: Diagnosis not present

## 2019-09-11 DIAGNOSIS — M5136 Other intervertebral disc degeneration, lumbar region: Secondary | ICD-10-CM | POA: Diagnosis not present

## 2019-09-11 LAB — COMPREHENSIVE METABOLIC PANEL
ALT: 42 U/L (ref 0–44)
AST: 25 U/L (ref 15–41)
Albumin: 3.5 g/dL (ref 3.5–5.0)
Alkaline Phosphatase: 64 U/L (ref 38–126)
Anion gap: 14 (ref 5–15)
BUN: 26 mg/dL — ABNORMAL HIGH (ref 6–20)
CO2: 25 mmol/L (ref 22–32)
Calcium: 8.3 mg/dL — ABNORMAL LOW (ref 8.9–10.3)
Chloride: 103 mmol/L (ref 98–111)
Creatinine, Ser: 0.96 mg/dL (ref 0.44–1.00)
GFR calc Af Amer: >60 mL/min (ref >60)
GFR calc non Af Amer: >60 mL/min (ref >60)
Glucose, Bld: 101 mg/dL — ABNORMAL HIGH (ref 70–99)
Potassium: 4.4 mmol/L (ref 3.5–5.1)
Sodium: 142 mmol/L (ref 135–145)
Total Bilirubin: 0.6 mg/dL (ref 0.3–1.2)
Total Protein: 6.1 g/dL — ABNORMAL LOW (ref 6.5–8.1)

## 2019-09-11 LAB — CBC WITH DIFFERENTIAL/PLATELET
Abs Immature Granulocytes: 0.06 10*3/uL (ref 0.00–0.07)
Basophils Absolute: 0 10*3/uL (ref 0.0–0.1)
Basophils Relative: 0 %
Eosinophils Absolute: 0.2 10*3/uL (ref 0.0–0.5)
Eosinophils Relative: 2 %
HCT: 45.8 % (ref 36.0–46.0)
Hemoglobin: 14.8 g/dL (ref 12.0–15.0)
Immature Granulocytes: 1 %
Lymphocytes Relative: 37 %
Lymphs Abs: 3.9 10*3/uL (ref 0.7–4.0)
MCH: 29.2 pg (ref 26.0–34.0)
MCHC: 32.3 g/dL (ref 30.0–36.0)
MCV: 90.3 fL (ref 80.0–100.0)
Monocytes Absolute: 0.8 10*3/uL (ref 0.1–1.0)
Monocytes Relative: 7 %
Neutro Abs: 5.5 10*3/uL (ref 1.7–7.7)
Neutrophils Relative %: 53 %
Platelets: 219 10*3/uL (ref 150–400)
RBC: 5.07 MIL/uL (ref 3.87–5.11)
RDW: 15.4 % (ref 11.5–15.5)
WBC: 10.4 10*3/uL (ref 4.0–10.5)
nRBC: 0 % (ref 0.0–0.2)

## 2019-09-11 LAB — LACTIC ACID, PLASMA: Lactic Acid, Venous: 1.6 mmol/L (ref 0.5–1.9)

## 2019-09-11 LAB — C-REACTIVE PROTEIN: CRP: 1.9 mg/dL — ABNORMAL HIGH (ref <1.0)

## 2019-09-11 LAB — BRAIN NATRIURETIC PEPTIDE: B Natriuretic Peptide: 39.2 pg/mL (ref 0.0–100.0)

## 2019-09-11 LAB — D-DIMER, QUANTITATIVE: D-Dimer, Quant: 0.29 ug/mL-FEU (ref 0.00–0.50)

## 2019-09-11 LAB — FERRITIN: Ferritin: 168 ng/mL (ref 11–307)

## 2019-09-11 LAB — LACTATE DEHYDROGENASE: LDH: 191 U/L (ref 98–192)

## 2019-09-11 LAB — FIBRINOGEN: Fibrinogen: 201 mg/dL — ABNORMAL LOW (ref 210–475)

## 2019-09-11 LAB — PROCALCITONIN: Procalcitonin: <0.1 ng/mL

## 2019-09-11 LAB — I-STAT BETA HCG BLOOD, ED (MC, WL, AP ONLY): I-stat hCG, quantitative: <5 m[IU]/mL (ref <5)

## 2019-09-11 LAB — TRIGLYCERIDES: Triglycerides: 559 mg/dL — ABNORMAL HIGH (ref <150)

## 2019-09-11 MED ORDER — ALBUTEROL SULFATE HFA 108 (90 BASE) MCG/ACT IN AERS
2.0000 | INHALATION_SPRAY | RESPIRATORY_TRACT | Status: DC | PRN
  Filled 2019-09-11: qty 6.7

## 2019-09-11 MED ORDER — FUROSEMIDE 10 MG/ML IJ SOLN
40.0000 mg | Freq: Every day | INTRAMUSCULAR | Status: DC
  Administered 2019-09-11 – 2019-09-12 (×2): 40 mg via INTRAVENOUS
  Filled 2019-09-11 (×2): qty 4

## 2019-09-11 MED ORDER — ACETAMINOPHEN 325 MG PO TABS
650.0000 mg | ORAL_TABLET | Freq: Four times a day (QID) | ORAL | Status: DC | PRN
  Administered 2019-09-14 (×2): 650 mg via ORAL
  Filled 2019-09-11 (×2): qty 2

## 2019-09-11 MED ORDER — HYDROCHLOROTHIAZIDE 25 MG PO TABS: 25.0000 mg | ORAL_TABLET | Freq: Every day | ORAL | Status: DC

## 2019-09-11 MED ORDER — OXYCODONE HCL 5 MG PO TABS
15.0000 mg | ORAL_TABLET | ORAL | Status: DC | PRN
  Administered 2019-09-11 – 2019-09-15 (×17): 15 mg via ORAL
  Filled 2019-09-11 (×17): qty 3

## 2019-09-11 MED ORDER — NYSTATIN 100000 UNIT/ML MT SUSP
5.0000 mL | Freq: Four times a day (QID) | OROMUCOSAL | Status: DC
  Administered 2019-09-11 – 2019-09-15 (×15): 500000 [IU] via ORAL
  Filled 2019-09-11 (×14): qty 5

## 2019-09-11 MED ORDER — FAMOTIDINE 20 MG PO TABS
40.0000 mg | ORAL_TABLET | Freq: Every day | ORAL | Status: DC
  Administered 2019-09-11 – 2019-09-14 (×4): 40 mg via ORAL
  Filled 2019-09-11 (×4): qty 2

## 2019-09-11 MED ORDER — MUSCLE RUB 10-15 % EX CREA
1.0000 "application " | TOPICAL_CREAM | CUTANEOUS | Status: DC | PRN
  Filled 2019-09-11: qty 85

## 2019-09-11 MED ORDER — GABAPENTIN 300 MG PO CAPS
300.0000 mg | ORAL_CAPSULE | Freq: Three times a day (TID) | ORAL | Status: DC
  Administered 2019-09-11 – 2019-09-15 (×14): 300 mg via ORAL
  Filled 2019-09-11 (×14): qty 1

## 2019-09-11 MED ORDER — GUAIFENESIN-DM 100-10 MG/5ML PO SYRP: 10.0000 mL | ORAL_SOLUTION | ORAL | Status: DC | PRN

## 2019-09-11 MED ORDER — HYDROMORPHONE HCL 2 MG/ML IJ SOLN
2.0000 mg | Freq: Once | INTRAMUSCULAR | Status: AC
  Administered 2019-09-11: 2 mg via INTRAVENOUS
  Filled 2019-09-11: qty 1

## 2019-09-11 MED ORDER — DIAZEPAM 5 MG PO TABS
5.0000 mg | ORAL_TABLET | Freq: Three times a day (TID) | ORAL | Status: DC | PRN
  Administered 2019-09-12 – 2019-09-15 (×7): 5 mg via ORAL
  Filled 2019-09-11 (×7): qty 1

## 2019-09-11 MED ORDER — IOHEXOL 350 MG/ML SOLN
100.0000 mL | Freq: Once | INTRAVENOUS | Status: AC | PRN
  Administered 2019-09-11: 100 mL via INTRAVENOUS

## 2019-09-11 MED ORDER — ONDANSETRON HCL 4 MG PO TABS
4.0000 mg | ORAL_TABLET | Freq: Four times a day (QID) | ORAL | Status: DC | PRN
  Administered 2019-09-12 – 2019-09-15 (×3): 4 mg via ORAL
  Filled 2019-09-11 (×3): qty 1

## 2019-09-11 MED ORDER — FLUCONAZOLE 100 MG PO TABS
100.0000 mg | ORAL_TABLET | Freq: Every day | ORAL | Status: DC
  Administered 2019-09-11 – 2019-09-15 (×5): 100 mg via ORAL
  Filled 2019-09-11 (×5): qty 1

## 2019-09-11 MED ORDER — METHYLPREDNISOLONE SODIUM SUCC 40 MG IJ SOLR
40.0000 mg | Freq: Two times a day (BID) | INTRAMUSCULAR | Status: DC
  Administered 2019-09-11 – 2019-09-14 (×6): 40 mg via INTRAVENOUS
  Filled 2019-09-11 (×6): qty 1

## 2019-09-11 MED ORDER — PANTOPRAZOLE SODIUM 40 MG PO TBEC
40.0000 mg | DELAYED_RELEASE_TABLET | ORAL | Status: DC
  Administered 2019-09-11 – 2019-09-15 (×5): 40 mg via ORAL
  Filled 2019-09-11 (×5): qty 1

## 2019-09-11 MED ORDER — HYDROCHLOROTHIAZIDE 25 MG PO TABS
25.0000 mg | ORAL_TABLET | Freq: Every day | ORAL | Status: DC
  Administered 2019-09-11: 25 mg via ORAL
  Filled 2019-09-11 (×2): qty 1

## 2019-09-11 MED ORDER — SODIUM CHLORIDE (PF) 0.9 % IJ SOLN
INTRAMUSCULAR | Status: AC
  Filled 2019-09-11: qty 50

## 2019-09-11 MED ORDER — ONDANSETRON HCL 4 MG/2ML IJ SOLN
4.0000 mg | Freq: Four times a day (QID) | INTRAMUSCULAR | Status: DC | PRN
  Administered 2019-09-13 – 2019-09-14 (×3): 4 mg via INTRAVENOUS
  Filled 2019-09-11 (×4): qty 2

## 2019-09-11 MED ORDER — ENOXAPARIN SODIUM 60 MG/0.6ML ~~LOC~~ SOLN
60.0000 mg | SUBCUTANEOUS | Status: DC
  Administered 2019-09-11 – 2019-09-15 (×5): 60 mg via SUBCUTANEOUS
  Filled 2019-09-11 (×6): qty 0.6

## 2019-09-11 MED ORDER — HYDROMORPHONE HCL 2 MG PO TABS
8.0000 mg | ORAL_TABLET | Freq: Four times a day (QID) | ORAL | Status: DC | PRN
  Administered 2019-09-11 – 2019-09-15 (×9): 8 mg via ORAL
  Filled 2019-09-11 (×10): qty 4

## 2019-09-11 MED ORDER — DEXAMETHASONE SODIUM PHOSPHATE 10 MG/ML IJ SOLN
6.0000 mg | Freq: Every day | INTRAMUSCULAR | Status: DC
  Administered 2019-09-11: 6 mg via INTRAVENOUS
  Filled 2019-09-11: qty 1

## 2019-09-11 NOTE — Progress Notes (Signed)
Report received from [facility] [place] in ED. Room ready for admission.

## 2019-09-11 NOTE — Progress Notes (Addendum)
Same day note  Patient seen and examined at bedside.  Patient was admitted to the hospital for shortness of breath, cough and fatigue.  At the time of my evaluation, patient complains of diffuse body pain including chest pain, shortness of breath.  Physical examination reveals a very obese female, on nasal cannula, nonspecific chest wall tenderness, diminished breath on bilaterally, trace peripheral edema  Laboratory data and imaging was reviewed.  CT angiogram of the chest repeated today showed no visible pulmonary embolism but opacification from COVID-19 disease.  Extent of disease was slightly improved compared to the prior scan from 08/28/2019.  Assessment and Plan.  Recent COVID-19 pneumonia, presented with shortness of breath, dyspnea on minimal exertion cough and hypoxia at home with saturation in the mid 80s.  CT angiogram without worsening findings.  No evidence of PE.  On 2 L of nasal cannula saturating 95%.  Procalcitonin negative.  No fever or chills.  Hold off with antibiotic.  Continue IV steroids for now. Wean oxygen as able.  Continue albuterol, incentive spirometer.  History of fibromyalgia.  On oxycodone.  History of possible diastolic heart failure.  Will hold off with HCTZ.  Start on IV Lasix 40 mg daily.  Oral thrush, sore throat.  Add oral fluconazole nystatin swish and swallow.  Chest wall pain.  History of fibromyalgia.  Focus on analgesia regimen.  EKG done today showed normal sinus rhythm.  No Charge  Signed,  Tenny Craw, MD [facility]

## 2019-09-11 NOTE — Progress Notes (Signed)
Patient arrived to unit from ED. VSS. Educated on call light system. Call light within reach. O2 at 1L via [place]. No shortness of breath at this time. Cont SpO2 and cardiac monitor applied. Will continue to monitor.

## 2019-09-11 NOTE — ED Triage Notes (Signed)
Patient recently admitted for Covid-19, returned to ED for NVD and increased shortness of breath. Patient has used all prescribed medications with no relief.

## 2019-09-11 NOTE — ED Provider Notes (Signed)
[name] DEPT Provider Note   CSN: [id number] Arrival date & time: 09/11/19  0117     History No chief complaint on file.   [name] is a 44 y.o. female.  Patient presents to the emergency department for evaluation of shortness of breath and severe dyspnea on exertion.  Patient was hospitalized from March 29 through April 4 with Covid.  She reports that she did feel significant improvement when she left the hospital but every day since she has progressively worsened.  She now cannot walk to the bathroom without becoming severely short of breath.  She has a home pulse oximeter and she reports that she drops into the 87% range with minimal exertion.  She feels tight and heavy across the chest.        Past Medical History:  Diagnosis Date  . Allergy   . Anxiety   . Arthritis   . B12 deficiency   . Chronic narcotic use   . Depression    not currently   . Dysrhythmia    hx tachy-was from medication nortriptylline  . Esophagitis    rx  . Fibromyalgia   . GERD (gastroesophageal reflux disease)   . History of echocardiogram    Echo 4/18: Mild concentric LVH, vigorous LVF, EF 65-70, normal wall motion, grade 1 diastolic dysfunction  . History of kidney stones   . Hyperlipidemia   . Intervertebral disc protrusion 01/11/2014  . Migraine   . Obesity (BMI 35.0-39.9 without comorbidity)   . Peripheral neuropathy   . Polycystic ovaries   . Pulmonary embolus ([place])   . Spinal stenosis, lumbar   . Vitamin D deficiency     Patient Active Problem List   Diagnosis Date Noted  . Acute respiratory failure with hypoxia ([place]) 08/28/2019  . Chronic pain 08/28/2019  . Acute respiratory disease due to COVID-19 virus 08/28/2019  . Major depressive disorder with single episode, in partial remission ([place]) 07/03/2019  . Supraventricular tachycardia ([place]) 07/03/2019  . Hypercoagulable state ([place]) 07/03/2019  . Sepsis ([place]) 06/21/2018  . Leukocytosis  06/21/2018  . Hypertensive urgency 06/21/2018  . Fever   . Precordial pain   . Elevated lactic acid level   . Bandemia   . Migraine 06/07/2018  . Pulmonary embolism ([place]) 06/06/2018  . DDD (degenerative disc disease), lumbar 05/04/2018  . Atypical chest pain   . Spinal stenosis of lumbar region 04/01/2015  . Chronic migraine without aura without status migrainosus, not intractable 03/04/2015  . Elevated plasma metanephrines 11/16/2014  . Alopecia 05/21/2014  . Tachycardia 04/04/2014  . Sciatica 01/11/2014  . Back pain 01/11/2014  . Intervertebral disc protrusion 01/11/2014  . HTN (hypertension) 01/04/2013  . Hemorrhoids 04/25/2012  . Urinary retention 12/11/2010  . CERUMEN IMPACTION 01/28/2010  . Depression 04/08/2009  . Myalgia 10/11/2008  . Obesity (BMI 30-39.9) 08/10/2008  . ACNE VULGARIS 08/10/2008  . INSOMNIA 06/20/2008  . B12 deficiency 03/23/2008  . Hereditary and idiopathic peripheral neuropathy 02/27/2008  . ANXIETY DISORDER, GENERALIZED 01/04/2007  . GERD (gastroesophageal reflux disease) 01/04/2007  . DEGENERATIVE JOINT DISEASE, KNEES, BILATERAL 01/04/2007  . RENAL CALCULUS, HX OF 01/04/2007    Past Surgical History:  Procedure Laterality Date  . [place] STUDY N/A 05/27/2016   Procedure: [place] STUDY;  Surgeon: Manus Gunning, MD;  Location: [facility];  Service: Gastroenterology;  Laterality: N/A;  . ABDOMINAL EXPOSURE N/A 05/04/2018   Procedure: ABDOMINAL EXPOSURE;  Surgeon: Rosetta Posner, MD;  Location:  [facility] OR;  Service: Vascular;  Laterality: N/A;  . ANTERIOR LUMBAR FUSION N/A 05/04/2018   Procedure: Lumbar Five Sacral One Anterior lumbar interbody fusion;  Surgeon: Donalee Citrinram, Gary, MD;  [facility];  Service: Neurosurgery;  Laterality: N/A;  Lumbar Five Sacral One Anterior lumbar interbody fusion  . APPENDECTOMY    . BACK SURGERY  2012   left laminectomy at L3-4 per Dr. Phoebe PerchHirsch   . bladder tack  2012  . CHOLECYSTECTOMY N/A 10/03/2015    Procedure: LAPAROSCOPIC CHOLECYSTECTOMY;  Surgeon: Rodman PickleLuke Aaron Kinsinger, MD;  [facility];  Service: General;  Laterality: N/A;  . ESOPHAGEAL MANOMETRY N/A 05/27/2016   Procedure: ESOPHAGEAL MANOMETRY (EM);  Surgeon: Ruffin FrederickSteven Paul Armbruster, MD;  Location: [facility];  Service: Gastroenterology;  Laterality: N/A;  . EXCISION OF SKIN TAG  10/03/2015   Procedure: EXCISION OF SKIN TAG;  Surgeon: De BlanchLuke Aaron Kinsinger, MD;  Location: [facility] OR;  Service: General;;  . LUMBAR DISC SURGERY  01-31-14   per Dr. Wynetta Emeryram   . OOPHORECTOMY     left overy  . OVARIAN CYST REMOVAL    . RECTOCELE REPAIR    . SINUSOTOMY  12/14/06  . spinal fusion  2016/10  . TONSILLECTOMY    . TONSILLECTOMY    . VAGINAL HYSTERECTOMY    . WRIST GANGLION EXCISION Left      OB History    Gravida  2   Para  2   Term      Preterm  2   AB  0   Living  2     SAB      TAB      Ectopic      Multiple      Live Births  2           Family History  Problem Relation Age of Onset  . Fibromyalgia Mother   . Diabetes Mother   . Thyroid cancer Mother   . Liver disease Mother   . Neuropathy Mother   . Cirrhosis Mother        non-alcoholic  . Pulmonary embolism Mother        both lungs  . Heart failure Mother   . Hypertension Father   . Diabetes Father   . Heart disease Father   . Prostate cancer Father   . Heart attack Father        x 2  . Colon cancer Neg Hx   . Other Neg Hx        pheochromocytoma  . Colon polyps Neg Hx     Social History   Tobacco Use  . Smoking status: Former Smoker    Types: Cigarettes    Quit date: 1999    Years since quitting: 22.2  . Smokeless tobacco: Never Used  Substance Use Topics  . Alcohol use: No    Alcohol/week: 0.0 standard drinks  . Drug use: No    Home Medications Prior to Admission medications   Medication Sig Start Date End Date Taking? Authorizing Provider  albuterol (VENTOLIN HFA) 108 (90 Base) MCG/ACT inhaler Inhale 2 puffs into the lungs every 4  (four) hours as needed for wheezing or shortness of breath. 08/25/19   Nelwyn SalisburyFry, [name], MD  ALPRAZolam [name](XANAX) 0.5 MG tablet Take 0.5 mg by mouth 3 (three) times daily as needed for anxiety.  05/13/19   [provider]  ascorbic acid (VITAMIN C) 500 MG tablet Take 1 tablet (500 mg total) by mouth daily. 09/06/19  Rodolph Bong, MD  calcium carbonate (TUMS - DOSED IN MG ELEMENTAL CALCIUM) 500 MG chewable tablet Chew 1 tablet by mouth 2 (two) times daily as needed for indigestion or heartburn.    [provider]  cetirizine (ZYRTEC) 10 MG tablet Take 10 mg by mouth daily.    [provider]  chlorhexidine (PERIDEX) 0.12 % solution Use as directed 15 mLs in the mouth or throat 2 (two) times daily.     [provider]  cyanocobalamin (,VITAMIN B-12,) 1000 MCG/ML injection INJECT 1 ML (1,000 MCG TOTAL) INTO THE MUSCLE EVERY 7 DAYS. Patient taking differently: Inject 1,000 mcg into the muscle once a week.  07/21/19   [name], MD  diazepam (VALIUM) 5 MG tablet Take 1 tablet (5 mg total) by mouth every 8 (eight) hours as needed for muscle spasms (muscle spasm/pain). 07/18/19   [name], MD  famotidine (PEPCID) 40 MG tablet TAKE 1 TABLET BY MOUTH EVERYDAY AT BEDTIME Patient taking differently: Take 40 mg by mouth at bedtime.  03/14/19   [name], MD  fluticasone (FLONASE) 50 MCG/ACT nasal spray Place 2 sprays into both nostrils every evening.     [provider]  gabapentin (NEURONTIN) 300 MG capsule TAKE 1 CAPSULE BY MOUTH THREE TIMES A DAY Patient taking differently: Take 300 mg by mouth 3 (three) times daily.  08/07/19   [name], MD  hydrochlorothiazide (HYDRODIURIL) 25 MG tablet TAKE 1 TABLET BY MOUTH EVERY DAY Patient taking differently: Take 25 mg by mouth daily.  08/10/19   [name], MD  HYDROmorphone (DILAUDID) 8 MG tablet Take 1 tablet (8 mg total) by mouth every 6 (six) hours as needed for severe pain. 09/18/19 10/18/19  [name], MD  Ipratropium-Albuterol (COMBIVENT) 20-100 MCG/ACT AERS respimat Inhale 2 puffs into the lungs 3 (three) times daily for 5 days. 09/05/19 09/10/19  Rodolph Bong, MD  Menthol-Methyl Salicylate (BEN GAY GREASELESS) 10-15 % greaseless cream Apply 1 application topically as needed (for muscle pain). 09/05/19   Rodolph Bong, MD  mometasone-formoterol (DULERA) 100-5 MCG/ACT AERO Inhale 2 puffs into the lungs 2 (two) times daily for 5 days. Use for 5 days then stop. 09/05/19 09/10/19  Rodolph Bong, MD  Multiple Vitamins-Minerals (MULTI-VITAMIN GUMMIES PO) Take 2 tablets by mouth daily.     [provider]  nitrofurantoin, macrocrystal-monohydrate, (MACROBID) 100 MG capsule Take 100 mg by mouth daily as needed (for uti prophylaxis, after intercourse).  09/26/16   [provider]  oxyCODONE (ROXICODONE) 15 MG immediate release tablet Take 1 tablet (15 mg total) by mouth every 4 (four) hours as needed for pain. 09/18/19 10/18/19  [name], MD  pantoprazole (PROTONIX) 40 MG tablet Take 1 tablet (40 mg total) by mouth every morning. Take 30 minutes before breakfast. 11/23/18   Meredith Pel, NP  promethazine (PHENERGAN) 25 MG tablet TAKE 1 TABLET BY MOUTH EVERY 6 HOURS AS NEEDED FOR NAUSEA Patient taking differently: Take 25 mg by mouth every 6 (six) hours as needed for nausea or vomiting.  02/19/17   [name], MD  sucralfate (CARAFATE) 1 g tablet Take 1 tablet (1 g total) by mouth 4 (four) times daily. Patient taking differently: Take 1 g by mouth daily.  06/08/19   [name], MD  SUMAtriptan (IMITREX) 100 MG tablet Take 1 tablet earliest onset of migraine.  May repeat once in 2 hours if headache persists or recurs. Patient  taking differently: Take 100 mg by mouth every 2 (two) hours as needed for migraine.  10/14/16   Jaffe, Adam R, DO  SYRINGE-NEEDLE, DISP, 3 ML (B-D 3CC LUER-LOK SYR 25GX1") 25G X 1" 3 ML MISC USE AS DIRECTED 05/15/19   [name], MD    Vitamin D, Ergocalciferol, (DRISDOL) 1.25 MG (50000 UT) CAPS capsule TAKE 1 CAPSULE (50,000 UNITS TOTAL) BY MOUTH EVERY 7 (SEVEN) DAYS. 07/05/18   [name], MD    Allergies    Gadolinium derivatives, Other, and Zoloft [sertraline hcl]  Review of Systems   Review of Systems  Respiratory: Positive for cough and shortness of breath.   Musculoskeletal: Positive for back pain (chronic).  All other systems reviewed and are negative.   Physical Exam Updated Vital Signs BP (!) 114/35   Pulse 98   Temp 98.4 F (36.9 C) (Oral)   Resp (!) 24   Ht  (1.778 m)   Wt 121.1 kg   LMP 12/13/1999 Comment: single oophorectomy//a.c.  SpO2 94%   BMI 38.31 kg/m   Physical Exam Vitals and nursing note reviewed.  Constitutional:      General: She is not in acute distress.    Appearance: Normal appearance. She is well-developed.  HENT:     Head: Normocephalic and atraumatic.     Right Ear: Hearing normal.     Left Ear: Hearing normal.     Nose: Nose normal.  Eyes:     Conjunctiva/sclera: Conjunctivae normal.     Pupils: Pupils are equal, round, and reactive to light.  Cardiovascular:     Rate and Rhythm: Regular rhythm. Tachycardia present.     Heart sounds: S1 normal and S2 normal. No murmur. No friction rub. No gallop.   Pulmonary:     Effort: Tachypnea and accessory muscle usage present.     Breath sounds: Normal breath sounds.  Chest:     Chest wall: No tenderness.  Abdominal:     General: Bowel sounds are normal.     Palpations: Abdomen is soft.     Tenderness: There is no abdominal tenderness. There is no guarding or rebound. Negative signs include Murphy's sign and McBurney's sign.     Hernia: No hernia is present.  Musculoskeletal:        General: Normal range of motion.     Cervical back: Normal range of motion and neck supple.  Skin:    General: Skin is warm and dry.     Findings: No rash.  Neurological:     Mental Status: She is alert and oriented to person,  place, and time.     GCS: GCS eye subscore is 4. GCS verbal subscore is 5. GCS motor subscore is 6.     Cranial Nerves: No cranial nerve deficit.     Sensory: No sensory deficit.     Coordination: Coordination normal.  Psychiatric:        Speech: Speech normal.        Behavior: Behavior normal.        Thought Content: Thought content normal.     ED Results / Procedures / Treatments   Labs (all labs ordered are listed, but only abnormal results are displayed) Labs Reviewed  COMPREHENSIVE METABOLIC PANEL - Abnormal; Notable for the following components:      Result Value   Glucose, Bld 101 (*)    BUN 26 (*)    Calcium 8.3 (*)    Total Protein 6.1 (*)  All other components within normal limits  TRIGLYCERIDES - Abnormal; Notable for the following components:   Triglycerides 559 (*)    All other components within normal limits  FIBRINOGEN - Abnormal; Notable for the following components:   Fibrinogen 201 (*)    All other components within normal limits  CULTURE, BLOOD (ROUTINE X 2)  CULTURE, BLOOD (ROUTINE X 2)  LACTIC ACID, PLASMA  CBC WITH DIFFERENTIAL/PLATELET  D-DIMER, QUANTITATIVE (NOT AT [facility])  PROCALCITONIN  LACTATE DEHYDROGENASE  BRAIN NATRIURETIC PEPTIDE  FERRITIN  C-REACTIVE PROTEIN  I-STAT BETA HCG BLOOD, ED ([facility], [facility], [facility] ONLY)    EKG EKG Interpretation  Date/Time:  Monday September 11 2019 03:36:56 EDT Ventricular Rate:  106 PR Interval:    QRS Duration: 75 QT Interval:  332 QTC Calculation: 441 R Axis:   63 Text Interpretation: Sinus tachycardia Otherwise within normal limits Confirmed by Gilda Crease [phone]) on 09/11/2019 4:05:38 AM   Radiology CT ANGIO CHEST PE W OR WO CONTRAST  Result Date: 09/11/2019 CLINICAL DATA:  Increasing shortness of breath.  COVID-19. EXAM: CT ANGIOGRAPHY CHEST WITH CONTRAST TECHNIQUE: Multidetector CT imaging of the chest was performed using the standard protocol during bolus administration of intravenous contrast.  Multiplanar CT image reconstructions and MIPs were obtained to evaluate the vascular anatomy. CONTRAST:  OMNIPAQUE IOHEXOL 350 MG/ML SOLN COMPARISON:  08/28/2019 FINDINGS: Cardiovascular: Satisfactory opacification of the pulmonary arteries to the segmental level. No evidence of pulmonary embolism when accounting for extensive motion artifact. Normal heart size. No pericardial effusion. Mediastinum/Nodes: Negative for adenopathy or inflammatory changes. Lungs/Pleura: Low volume chest with patchy ground-glass and streaky opacity correlating with the history. The degree of opacities improved from prior. No edema, effusion, or pneumothorax. Upper Abdomen: No acute abnormality. Musculoskeletal: No acute or aggressive finding Review of the MIP images confirms the above findings. IMPRESSION: 1. Extensive motion artifact.  No visible pulmonary embolism. 2. Bilateral pulmonary opacification correlating with history of COVID-19 positivity. The extent of disease is improved from 08/28/2019. Electronically Signed   By: Marnee Spring M.D.   On: 09/11/2019 05:40   DG Chest Port 1 View  Result Date: 09/11/2019 CLINICAL DATA:  Shortness of breath EXAM: PORTABLE CHEST 1 VIEW COMPARISON:  08/28/2019 FINDINGS: Low lung volumes. Partial but incomplete resolution of streaky pulmonary opacities. Normal heart size. No pneumothorax. IMPRESSION: Partial but incomplete resolution of streaky bilateral pulmonary opacities which may reflect slowly resolving pneumonia. Electronically Signed   By: Jasmine Pang M.D.   On: 09/11/2019 04:21    Procedures Procedures (including critical care time)  Medications Ordered in ED Medications  sodium chloride (PF) 0.9 % injection (has no administration in time range)  HYDROmorphone (DILAUDID) injection 2 mg (2 mg Intravenous Given 09/11/19 0337)  iohexol (OMNIPAQUE) 350 MG/ML injection 100 mL (100 mLs Intravenous Contrast Given 09/11/19 0517)    ED Course  I have reviewed the triage  vital signs and the nursing notes.  Pertinent labs & imaging results that were available during my care of the patient were reviewed by me and considered in my medical decision making (see chart for details).    MDM Rules/Calculators/A&P                      Patient presents to the emergency department for evaluation of worsening shortness of breath.  Patient was recently hospitalized for Covid pneumonia.  She reports that she improved during the hospital stay but since going home has progressively worsened.  She is experiencing  severe dyspnea on exertion.  All of her markers are actually improved from her hospital stay, no evidence of pulmonary embolism.  Based on her significant worsening, however, will readmit the patient for further management.  Final Clinical Impression(s) / ED Diagnoses Final diagnoses:  Pneumonia due to COVID-19 virus    Rx / DC Orders ED Discharge Orders    None       Yesennia Hirota, Canary Brim, MD 09/11/19 [phone]

## 2019-09-11 NOTE — ED Notes (Signed)
Attempted to give report to RN at 4W. RN unable to take report at this time and will call this RN back.

## 2019-09-11 NOTE — H&P (Signed)
History and Physical    DALEYZA GADOMSKI OFB:[id number] DOB: [birth year] DOA: 09/11/2019  PCP: [name], MD   Patient coming from: Home   Chief Complaint: SOB, cough, fatigue  HPI: [name] is a 45 y.o. female with medical history significant for BMI 38, chronic back pain, depression, anxiety, GERD, and hypertension, now presenting to the emergency department for evaluation of worsening shortness of breath and fatigue after initially having some improvement from COVID-19.  Patient was diagnosed with COVID-19 on [facility] in [facility] Every[facility]), required hospital admission for viral pneumonia with acute hypoxic respiratory failure, was discharged on 09/05/2019 in improved condition, but has since experienced progressive worsening and fatigue, nonproductive cough, and shortness of breath.  She continued steroids at home and finish the course yesterday, and has also been using inhalers with some fleeting relief.  She has monitored oxygen saturations at home and notes consistent drops into the mid 80s with mild exertion.  She has had some chest discomfort with cough or deep breath.  She reports some mild chronic lower extremity swelling that seems to be unchanged.  ED Course: Upon arrival to the ED, patient is found to be afebrile, saturating 92% on room air while at rest, tachypneic in the mid 20s, tachycardic to 130, and with stable blood pressure.  EKG features sinus tachycardia with rate 126 and PVC.  CTA chest is limited by motion artifact but no PE is seen.  Bilateral opacities seem to have improved some on the CTA.  Chemistry panel notable for an elevated BUN to creatinine ratio.  CBC unremarkable.  Lactic acid normal.  Procalcitonin undetectable and D-dimer is normal.  CRP is elevated to 1.9.  Patient was placed on supplemental oxygen in the ED and hospitalist consulted for admission.  Review of Systems:  All other systems reviewed and apart from HPI, are negative.  Past  Medical History:  Diagnosis Date  . Allergy   . Anxiety   . Arthritis   . B12 deficiency   . Chronic narcotic use   . Depression    not currently   . Dysrhythmia    hx tachy-was from medication nortriptylline  . Esophagitis    rx  . Fibromyalgia   . GERD (gastroesophageal reflux disease)   . History of echocardiogram    Echo 4/18: Mild concentric LVH, vigorous LVF, EF 65-70, normal wall motion, grade 1 diastolic dysfunction  . History of kidney stones   . Hyperlipidemia   . Intervertebral disc protrusion 01/11/2014  . Migraine   . Obesity (BMI 35.0-39.9 without comorbidity)   . Peripheral neuropathy   . Polycystic ovaries   . Pulmonary embolus (HCC)   . Spinal stenosis, lumbar   . Vitamin D deficiency     Past Surgical History:  Procedure Laterality Date  . 24 HOUR PH STUDY N/A 05/27/2016   Procedure: 24 HOUR PH STUDY;  Surgeon: Ruffin Frederick, MD;  Location: [facility];  Service: Gastroenterology;  Laterality: N/A;  . ABDOMINAL EXPOSURE N/A 05/04/2018   Procedure: ABDOMINAL EXPOSURE;  Surgeon: Larina Earthly, MD;  Location: [facility] OR;  Service: Vascular;  Laterality: N/A;  . ANTERIOR LUMBAR FUSION N/A 05/04/2018   Procedure: Lumbar Five Sacral One Anterior lumbar interbody fusion;  Surgeon: Donalee Citrin, MD;  Location: [facility] OR;  Service: Neurosurgery;  Laterality: N/A;  Lumbar Five Sacral One Anterior lumbar interbody fusion  . APPENDECTOMY    . BACK SURGERY  2012   left laminectomy at L3-4 per  Dr. Phoebe Perch   . bladder tack  2012  . CHOLECYSTECTOMY N/A 10/03/2015   Procedure: LAPAROSCOPIC CHOLECYSTECTOMY;  Surgeon: Rodman Pickle, MD;  Location: [facility] OR;  Service: General;  Laterality: N/A;  . ESOPHAGEAL MANOMETRY N/A 05/27/2016   Procedure: ESOPHAGEAL MANOMETRY (EM);  Surgeon: Ruffin Frederick, MD;  Location: [facility];  Service: Gastroenterology;  Laterality: N/A;  . EXCISION OF SKIN TAG  10/03/2015   Procedure: EXCISION OF SKIN TAG;  Surgeon: De Blanch  Kinsinger, MD;  Location: [facility] OR;  Service: General;;  . LUMBAR DISC SURGERY  01-31-14   per Dr. [name]   . OOPHORECTOMY     left overy  . OVARIAN CYST REMOVAL    . RECTOCELE REPAIR    . SINUSOTOMY  12/14/06  . spinal fusion  2016/10  . TONSILLECTOMY    . TONSILLECTOMY    . VAGINAL HYSTERECTOMY    . WRIST GANGLION EXCISION Left      reports that she quit smoking about 22 years ago. Her smoking use included cigarettes. She has never used smokeless tobacco. She reports that she does not drink alcohol or use drugs.  Allergies  Allergen Reactions  . Gadolinium Derivatives Swelling and Other (See Comments)    Arm swelling, tingling, & redness. Radiologist Dr. Grace Isaac recommends an injection of steroids, or 13-hour prep if non-emergent, if patient needs a gadolinium based contrast in the future.  . Other Rash and Other (See Comments)    Glue or tape to seal up wound  . Zoloft [Sertraline Hcl] Other (See Comments)    Hair loss    Family History  Problem Relation Age of Onset  . Fibromyalgia Mother   . Diabetes Mother   . Thyroid cancer Mother   . Liver disease Mother   . Neuropathy Mother   . Cirrhosis Mother        non-alcoholic  . Pulmonary embolism Mother        both lungs  . Heart failure Mother   . Hypertension Father   . Diabetes Father   . Heart disease Father   . Prostate cancer Father   . Heart attack Father        x 2  . Colon cancer Neg Hx   . Other Neg Hx        pheochromocytoma  . Colon polyps Neg Hx      Prior to Admission medications   Medication Sig Start Date End Date Taking? Authorizing Provider  albuterol (VENTOLIN HFA) 108 (90 Base) MCG/ACT inhaler Inhale 2 puffs into the lungs every 4 (four) hours as needed for wheezing or shortness of breath. 08/25/19   [name], MD  ALPRAZolam [name]) 0.5 MG tablet Take 0.5 mg by mouth 3 (three) times daily as needed for anxiety.  05/13/19   [provider]  ascorbic acid (VITAMIN C) 500 MG tablet Take 1  tablet (500 mg total) by mouth daily. 09/06/19   Rodolph Bong, MD  calcium carbonate (TUMS - DOSED IN MG ELEMENTAL CALCIUM) 500 MG chewable tablet Chew 1 tablet by mouth 2 (two) times daily as needed for indigestion or heartburn.    [provider]  cetirizine (ZYRTEC) 10 MG tablet Take 10 mg by mouth daily.    [provider]  chlorhexidine (PERIDEX) 0.12 % solution Use as directed 15 mLs in the mouth or throat 2 (two) times daily.     [provider]  cyanocobalamin (,VITAMIN B-12,) 1000 MCG/ML injection INJECT 1 ML (1,000  MCG TOTAL) INTO THE MUSCLE EVERY 7 DAYS. Patient taking differently: Inject 1,000 mcg into the muscle once a week.  07/21/19   Nelwyn SalisburyFry, [name], MD  diazepam (VALIUM) 5 MG tablet Take 1 tablet (5 mg total) by mouth every 8 (eight) hours as needed for muscle spasms (muscle spasm/pain). 07/18/19   Nelwyn SalisburyFry, [name], MD  famotidine (PEPCID) 40 MG tablet TAKE 1 TABLET BY MOUTH EVERYDAY AT BEDTIME Patient taking differently: Take 40 mg by mouth at bedtime.  03/14/19   Nelwyn SalisburyFry, [name], MD  fluticasone (FLONASE) 50 MCG/ACT nasal spray Place 2 sprays into both nostrils every evening.     [provider]  gabapentin (NEURONTIN) 300 MG capsule TAKE 1 CAPSULE BY MOUTH THREE TIMES A DAY Patient taking differently: Take 300 mg by mouth 3 (three) times daily.  08/07/19   Nelwyn SalisburyFry, [name], MD  hydrochlorothiazide (HYDRODIURIL) 25 MG tablet TAKE 1 TABLET BY MOUTH EVERY DAY Patient taking differently: Take 25 mg by mouth daily.  08/10/19   Nelwyn SalisburyFry, [name], MD  HYDROmorphone (DILAUDID) 8 MG tablet Take 1 tablet (8 mg total) by mouth every 6 (six) hours as needed for severe pain. 09/18/19 10/18/19  Nelwyn SalisburyFry, [name], MD  Ipratropium-Albuterol (COMBIVENT) 20-100 MCG/ACT AERS respimat Inhale 2 puffs into the lungs 3 (three) times daily for 5 days. 09/05/19 09/10/19  Rodolph Bonghompson, Daniel V, MD  Menthol-Methyl Salicylate (BEN GAY GREASELESS) 10-15 % greaseless cream Apply 1 application  topically as needed (for muscle pain). 09/05/19   Rodolph Bonghompson, Daniel V, MD  mometasone-formoterol (DULERA) 100-5 MCG/ACT AERO Inhale 2 puffs into the lungs 2 (two) times daily for 5 days. Use for 5 days then stop. 09/05/19 09/10/19  Rodolph Bonghompson, Daniel V, MD  Multiple Vitamins-Minerals (MULTI-VITAMIN GUMMIES PO) Take 2 tablets by mouth daily.     [provider]  nitrofurantoin, macrocrystal-monohydrate, (MACROBID) 100 MG capsule Take 100 mg by mouth daily as needed (for uti prophylaxis, after intercourse).  09/26/16   [provider]  oxyCODONE (ROXICODONE) 15 MG immediate release tablet Take 1 tablet (15 mg total) by mouth every 4 (four) hours as needed for pain. 09/18/19 10/18/19  Nelwyn SalisburyFry, [name], MD  pantoprazole (PROTONIX) 40 MG tablet Take 1 tablet (40 mg total) by mouth every morning. Take 30 minutes before breakfast. 11/23/18   Meredith PelGuenther, Paula M, NP  promethazine (PHENERGAN) 25 MG tablet TAKE 1 TABLET BY MOUTH EVERY 6 HOURS AS NEEDED FOR NAUSEA Patient taking differently: Take 25 mg by mouth every 6 (six) hours as needed for nausea or vomiting.  02/19/17   Nelwyn SalisburyFry, [name], MD  sucralfate (CARAFATE) 1 g tablet Take 1 tablet (1 g total) by mouth 4 (four) times daily. Patient taking differently: Take 1 g by mouth daily.  06/08/19   Nelwyn SalisburyFry, [name], MD  SUMAtriptan (IMITREX) 100 MG tablet Take 1 tablet earliest onset of migraine.  May repeat once in 2 hours if headache persists or recurs. Patient taking differently: Take 100 mg by mouth every 2 (two) hours as needed for migraine.  10/14/16   Jaffe, Adam R, DO  SYRINGE-NEEDLE, DISP, 3 ML (B-D 3CC LUER-LOK SYR 25GX1") 25G X 1" 3 ML MISC USE AS DIRECTED 05/15/19   Nelwyn SalisburyFry, [name], MD  Vitamin D, Ergocalciferol, (DRISDOL) 1.25 MG (50000 UT) CAPS capsule TAKE 1 CAPSULE (50,000 UNITS TOTAL) BY MOUTH EVERY 7 (SEVEN) DAYS. 07/05/18   Nelwyn SalisburyFry, [name], MD    Physical Exam: Vitals:   09/11/19 0405 09/11/19 0500 09/11/19 0600 09/11/19 0630  BP: (!) 152/114 (!)  114/35 (!) 151/101 (!) 156/91  Pulse: (!) 101 98 92 89  Resp: 18 (!) 24 18 18   Temp:      TempSrc:      SpO2: 92% 94% 93% 98%  Weight:      Height:        Constitutional: NAD, calm  Eyes: PERTLA, lids and conjunctivae normal ENMT: Mucous membranes are moist. Posterior pharynx clear of any exudate or lesions.   Neck: normal, supple, no masses, no thyromegaly Respiratory: Tachypneic, frequent cough, dyspnea with speech. No pallor or cyanosis.   Cardiovascular: Rate ~120 and regular. Trace bilateral lower leg swelling.   Abdomen: No distension, no tenderness, soft. Bowel sounds active.  Musculoskeletal: no clubbing / cyanosis. No joint deformity upper and lower extremities.   Skin: no significant rashes, lesions, ulcers. Warm, dry, well-perfused. Neurologic: No facial asymmetry. Sensation intact. Moving all extremities.  Psychiatric: Alert and oriented to person, place, and situation. Pleasant and cooperative.    Labs and Imaging on Admission: I have personally reviewed following labs and imaging studies  CBC: Recent Labs  Lab 09/05/19 0412 09/11/19 0318  WBC 10.1 10.4  NEUTROABS  --  5.5  HGB 15.3* 14.8  HCT 47.4* 45.8  MCV 90.1 90.3  PLT 463* 219   Basic Metabolic Panel: Recent Labs  Lab 09/05/19 0412 09/11/19 0318  NA 140 142  K 4.3 4.4  CL 103 103  CO2 25 25  GLUCOSE 168* 101*  BUN 31* 26*  CREATININE 0.97 0.96  CALCIUM 8.7* 8.3*  MG 2.4  --   PHOS 4.5  --    GFR: Estimated Creatinine Clearance: 104.6 mL/min (by C-G formula based on SCr of 0.96 mg/dL). Liver Function Tests: Recent Labs  Lab 09/11/19 0318  AST 25  ALT 42  ALKPHOS 64  BILITOT 0.6  PROT 6.1*  ALBUMIN 3.5   No results for input(s): LIPASE, AMYLASE in the last 168 hours. No results for input(s): AMMONIA in the last 168 hours. Coagulation Profile: No results for input(s): INR, PROTIME in the last 168 hours. Cardiac Enzymes: No results for input(s): CKTOTAL, CKMB, CKMBINDEX, TROPONINI  in the last 168 hours. BNP (last 3 results) No results for input(s): PROBNP in the last 8760 hours. HbA1C: No results for input(s): HGBA1C in the last 72 hours. CBG: Recent Labs  Lab 09/04/19 1152 09/04/19 1621 09/04/19 2059 09/05/19 0804 09/05/19 1201  GLUCAP 153* 109* 175* 136* 120*   Lipid Profile: Recent Labs    09/11/19 0318  TRIG 559*   Thyroid Function Tests: No results for input(s): TSH, T4TOTAL, FREET4, T3FREE, THYROIDAB in the last 72 hours. Anemia Panel: Recent Labs    09/11/19 0318  FERRITIN 168   Urine analysis:    Component Value Date/Time   COLORURINE YELLOW 08/29/2019 1028   APPEARANCEUR CLEAR 08/29/2019 1028   LABSPEC >1.046 (H) 08/29/2019 1028   PHURINE 6.0 08/29/2019 1028   GLUCOSEU NEGATIVE 08/29/2019 1028   HGBUR NEGATIVE 08/29/2019 1028   HGBUR negative 04/04/2007 1513   BILIRUBINUR NEGATIVE 08/29/2019 1028   BILIRUBINUR negative 12/24/2017 1316   KETONESUR 20 (A) 08/29/2019 1028   PROTEINUR 30 (A) 08/29/2019 1028   UROBILINOGEN 0.2 12/24/2017 1316   UROBILINOGEN 0.2 04/08/2015 1242   NITRITE NEGATIVE 08/29/2019 1028   LEUKOCYTESUR NEGATIVE 08/29/2019 1028   Sepsis Labs: @LABRCNTIP (procalcitonin:4,lacticidven:4) )No results found for this or any previous visit (from the past 240 hour(s)).   Radiological Exams on Admission: CT ANGIO CHEST PE W  OR WO CONTRAST  Result Date: 09/11/2019 CLINICAL DATA:  Increasing shortness of breath.  COVID-19. EXAM: CT ANGIOGRAPHY CHEST WITH CONTRAST TECHNIQUE: Multidetector CT imaging of the chest was performed using the standard protocol during bolus administration of intravenous contrast. Multiplanar CT image reconstructions and MIPs were obtained to evaluate the vascular anatomy. CONTRAST:  152mL OMNIPAQUE IOHEXOL 350 MG/ML SOLN COMPARISON:  08/28/2019 FINDINGS: Cardiovascular: Satisfactory opacification of the pulmonary arteries to the segmental level. No evidence of pulmonary embolism when accounting for  extensive motion artifact. Normal heart size. No pericardial effusion. Mediastinum/Nodes: Negative for adenopathy or inflammatory changes. Lungs/Pleura: Low volume chest with patchy ground-glass and streaky opacity correlating with the history. The degree of opacities improved from prior. No edema, effusion, or pneumothorax. Upper Abdomen: No acute abnormality. Musculoskeletal: No acute or aggressive finding Review of the MIP images confirms the above findings. IMPRESSION: 1. Extensive motion artifact.  No visible pulmonary embolism. 2. Bilateral pulmonary opacification correlating with history of COVID-19 positivity. The extent of disease is improved from 08/28/2019. Electronically Signed   By: Monte Fantasia M.D.   On: 09/11/2019 05:40   DG Chest Port 1 View  Result Date: 09/11/2019 CLINICAL DATA:  Shortness of breath EXAM: PORTABLE CHEST 1 VIEW COMPARISON:  08/28/2019 FINDINGS: Low lung volumes. Partial but incomplete resolution of streaky pulmonary opacities. Normal heart size. No pneumothorax. IMPRESSION: Partial but incomplete resolution of streaky bilateral pulmonary opacities which may reflect slowly resolving pneumonia. Electronically Signed   By: Donavan Foil M.D.   On: 09/11/2019 04:21    EKG: Independently reviewed. Sinus tachycardia (rate 126), PVCs.   Assessment/Plan   1. COVID-19 with respiratory illness  - [facility], admitted 3/29-4/6 with COVID pneumonia, felt improved at time of discharge, but now with progressive worsening and saturations in mid-80s at home with mild exertion  - CTA was limited by motion but no PE seen and d-dimer was normal  - Low suspicion for secondary bacterial PNA with low procalcitonin and no leukocytosis or sputum production  - She does not appear hypervolemic  - There is some wheezes on exam and worsening may be related to bronchospasms  - Continue isolation as <21 days from diagnosis with severe disease and  continued symptoms  - Resume steroids, continue albuterol, resume supplemental O2 as needed, trend markers    2. Chronic pain  - Continue home regimen with gabapentin, as-needed oxycodone and Dilaudid    3. Anxiety  - Continue as-needed benzodiazepine    4. Hypertension  - Continue HCTZ    5. GERD  - No esophagitis or ulcers on EGD in 2017  - Continue home PPI and H2-blocker     DVT prophylaxis: Lovenox  Code Status: Full  Family Communication: Discussed with patient  Disposition Plan: Home when able no longer dyspneic with minimal exertion  Consults called: None  Admission status: Observation     Vianne Bulls, MD [facility] Pager: See [email or web address]  If 7AM-7PM, please contact the daytime attending [email or web address]  09/11/2019, 6:55 AM

## 2019-09-11 NOTE — Progress Notes (Signed)
Lovenox per Pharmacy for DVT Prophylaxis    Pharmacy has been consulted from dosing enoxaparin (lovenox) in this patient for DVT prophylaxis.  The pharmacist has reviewed pertinent labs (Hgb _14.8__; PLT__219_), patient weight (__121_kg) and renal function (CrCl_>90__mL/min) and decided that enoxaparin _60_mg SQ Q24Hrs is appropriate for this patient.  The pharmacy department will sign off at this time.  Please reconsult pharmacy if status changes or for further issues.  Thank you  Luetta Nutting PharmD, BCPS  09/11/2019, 6:41 AM

## 2019-09-12 ENCOUNTER — Other Ambulatory Visit: Payer: Self-pay | Admitting: Family Medicine

## 2019-09-12 DIAGNOSIS — M5136 Other intervertebral disc degeneration, lumbar region: Secondary | ICD-10-CM | POA: Diagnosis present

## 2019-09-12 DIAGNOSIS — Z6838 Body mass index (BMI) 38.0-38.9, adult: Secondary | ICD-10-CM | POA: Diagnosis not present

## 2019-09-12 DIAGNOSIS — Z91041 Radiographic dye allergy status: Secondary | ICD-10-CM | POA: Diagnosis not present

## 2019-09-12 DIAGNOSIS — F329 Major depressive disorder, single episode, unspecified: Secondary | ICD-10-CM | POA: Diagnosis present

## 2019-09-12 DIAGNOSIS — B370 Candidal stomatitis: Secondary | ICD-10-CM | POA: Diagnosis present

## 2019-09-12 DIAGNOSIS — I10 Essential (primary) hypertension: Secondary | ICD-10-CM | POA: Diagnosis present

## 2019-09-12 DIAGNOSIS — F411 Generalized anxiety disorder: Secondary | ICD-10-CM | POA: Diagnosis present

## 2019-09-12 DIAGNOSIS — Z8249 Family history of ischemic heart disease and other diseases of the circulatory system: Secondary | ICD-10-CM | POA: Diagnosis not present

## 2019-09-12 DIAGNOSIS — M797 Fibromyalgia: Secondary | ICD-10-CM | POA: Diagnosis present

## 2019-09-12 DIAGNOSIS — F324 Major depressive disorder, single episode, in partial remission: Secondary | ICD-10-CM | POA: Diagnosis not present

## 2019-09-12 DIAGNOSIS — G894 Chronic pain syndrome: Secondary | ICD-10-CM | POA: Diagnosis present

## 2019-09-12 DIAGNOSIS — K219 Gastro-esophageal reflux disease without esophagitis: Secondary | ICD-10-CM | POA: Diagnosis present

## 2019-09-12 DIAGNOSIS — Z87891 Personal history of nicotine dependence: Secondary | ICD-10-CM | POA: Diagnosis not present

## 2019-09-12 DIAGNOSIS — U071 COVID-19: Secondary | ICD-10-CM | POA: Diagnosis present

## 2019-09-12 DIAGNOSIS — Z91048 Other nonmedicinal substance allergy status: Secondary | ICD-10-CM | POA: Diagnosis not present

## 2019-09-12 DIAGNOSIS — R944 Abnormal results of kidney function studies: Secondary | ICD-10-CM | POA: Diagnosis present

## 2019-09-12 DIAGNOSIS — J1282 Pneumonia due to coronavirus disease 2019: Secondary | ICD-10-CM | POA: Diagnosis present

## 2019-09-12 DIAGNOSIS — Z888 Allergy status to other drugs, medicaments and biological substances status: Secondary | ICD-10-CM | POA: Diagnosis not present

## 2019-09-12 DIAGNOSIS — M48061 Spinal stenosis, lumbar region without neurogenic claudication: Secondary | ICD-10-CM | POA: Diagnosis present

## 2019-09-12 DIAGNOSIS — Z79899 Other long term (current) drug therapy: Secondary | ICD-10-CM | POA: Diagnosis not present

## 2019-09-12 DIAGNOSIS — I493 Ventricular premature depolarization: Secondary | ICD-10-CM | POA: Diagnosis present

## 2019-09-12 DIAGNOSIS — J9601 Acute respiratory failure with hypoxia: Secondary | ICD-10-CM | POA: Diagnosis present

## 2019-09-12 DIAGNOSIS — R0602 Shortness of breath: Secondary | ICD-10-CM | POA: Diagnosis present

## 2019-09-12 LAB — D-DIMER, QUANTITATIVE: D-Dimer, Quant: <0.27 ug/mL-FEU (ref 0.00–0.50)

## 2019-09-12 LAB — CBC WITH DIFFERENTIAL/PLATELET
Abs Immature Granulocytes: 0.08 10*3/uL — ABNORMAL HIGH (ref 0.00–0.07)
Basophils Absolute: 0 10*3/uL (ref 0.0–0.1)
Basophils Relative: 0 %
Eosinophils Absolute: 0 10*3/uL (ref 0.0–0.5)
Eosinophils Relative: 0 %
HCT: 45.4 % (ref 36.0–46.0)
Hemoglobin: 14.6 g/dL (ref 12.0–15.0)
Immature Granulocytes: 1 %
Lymphocytes Relative: 12 %
Lymphs Abs: 1.4 10*3/uL (ref 0.7–4.0)
MCH: 29.8 pg (ref 26.0–34.0)
MCHC: 32.2 g/dL (ref 30.0–36.0)
MCV: 92.7 fL (ref 80.0–100.0)
Monocytes Absolute: 0.4 10*3/uL (ref 0.1–1.0)
Monocytes Relative: 3 %
Neutro Abs: 9.7 10*3/uL — ABNORMAL HIGH (ref 1.7–7.7)
Neutrophils Relative %: 84 %
Platelets: 175 10*3/uL (ref 150–400)
RBC: 4.9 MIL/uL (ref 3.87–5.11)
RDW: 14.8 % (ref 11.5–15.5)
WBC: 11.6 10*3/uL — ABNORMAL HIGH (ref 4.0–10.5)
nRBC: 0 % (ref 0.0–0.2)

## 2019-09-12 LAB — COMPREHENSIVE METABOLIC PANEL
ALT: 50 U/L — ABNORMAL HIGH (ref 0–44)
AST: 23 U/L (ref 15–41)
Albumin: 3.6 g/dL (ref 3.5–5.0)
Alkaline Phosphatase: 49 U/L (ref 38–126)
Anion gap: 13 (ref 5–15)
BUN: 22 mg/dL — ABNORMAL HIGH (ref 6–20)
CO2: 25 mmol/L (ref 22–32)
Calcium: 9 mg/dL (ref 8.9–10.3)
Chloride: 101 mmol/L (ref 98–111)
Creatinine, Ser: 0.8 mg/dL (ref 0.44–1.00)
GFR calc Af Amer: >60 mL/min (ref >60)
GFR calc non Af Amer: >60 mL/min (ref >60)
Glucose, Bld: 170 mg/dL — ABNORMAL HIGH (ref 70–99)
Potassium: 4.3 mmol/L (ref 3.5–5.1)
Sodium: 139 mmol/L (ref 135–145)
Total Bilirubin: 0.6 mg/dL (ref 0.3–1.2)
Total Protein: 6.3 g/dL — ABNORMAL LOW (ref 6.5–8.1)

## 2019-09-12 LAB — ABO/RH: ABO/RH(D): O POS

## 2019-09-12 LAB — MAGNESIUM: Magnesium: 2.3 mg/dL (ref 1.7–2.4)

## 2019-09-12 LAB — FERRITIN: Ferritin: 174 ng/mL (ref 11–307)

## 2019-09-12 LAB — C-REACTIVE PROTEIN: CRP: 1.8 mg/dL — ABNORMAL HIGH (ref <1.0)

## 2019-09-12 NOTE — Progress Notes (Addendum)
PROGRESS NOTE  [name] OHY:[id number] DOB: [birth year] DOA: 09/11/2019 PCP: [name], MD   LOS: 0 days   Brief narrative: As per HPI,  [name] is a 44 y.o. female with medical history significant for BMI 38, chronic back pain, depression, anxiety, GERD, and hypertension, recent diagnosis of COVID-19 on 08/24/2019 and was recently discharged after treatment on 09/05/2019 in improved condition presented to the hospital with worsening shortness of breath cough and fatigue with dyspnea on exertion and hypoxia on ambulation.   She continued steroids at home and finished a course of 1 day prior to presentation and had also been using inhalers with some fleeting relief.  She has monitored oxygen saturations at home and notes consistent drops into the mid 80s with mild exertion.   ED Course: Upon arrival to the ED, patient is found to be afebrile, saturating 92% on room air while at rest, tachypneic in the mid 20s, tachycardic to 130, and with stable blood pressure.  EKG features sinus tachycardia with rate 126 and PVC.  CTA chest is limited by motion artifact but no PE was seen.  Bilateral opacities seem to have improved some on the CTA.  Chemistry panel notable for an elevated BUN to creatinine ratio.  CBC unremarkable.  Lactic acid normal.  Procalcitonin undetectable and D-dimer is normal.  CRP is elevated to 1.9.  Patient was placed on supplemental oxygen in the ED and hospitalist consulted for admission.  Assessment/Plan:  Principal Problem:   Respiratory tract infection due to COVID-19 virus Active Problems:   ANXIETY DISORDER, GENERALIZED   Depression   HTN (hypertension)   Spinal stenosis of lumbar region   DDD (degenerative disc disease), lumbar   Chronic pain   Recent COVID-19 pneumonia, presented with shortness of breath, dyspnea on minimal exertion cough and hypoxia at home with saturation in the mid 80s.  Now with acute hypoxic respiratory failure.  On 3 L of oxygen.  CT angiogram without worsening findings.  No evidence of pulmonary embolism..  On 2 L of nasal cannula saturating 95%.  Procalcitonin negative.  No fever or chills.  Hold off with antibiotic.  Continue IV steroids for now. Wean oxygen as able.  Continue albuterol, incentive spirometer.  BNP 39.  History of fibromyalgia.  On oxycodone.  Diastolic dysfunction on echo.  Unlikely to be heart failure.  Was on HCTZ due to edema from gabapentin.  Received Lasix yesterday and today.  Will discontinue.  Oral thrush, sore throat.  Continue oral fluconazole nystatin swish and swallow.  Chest wall pain.  Better. History of fibromyalgia.  Focus on analgesia regimen.  EKG showed normal sinus rhythm.  Morbid obesity.  At baseline patient denies any dyspnea on exertion or at rest.  VTE Prophylaxis: Lovenox subcu  Code Status: Full code  Family Communication: I tried to reach the patient's spouse on the home and mobile number listed but was unable to reach him.  Disposition Plan:  . Patient is from home . Likely disposition to home likely in 2 to 3 days.  Might need oxygen on discharge. . Barriers to discharge: Post Covid acute hypoxic respiratory failure   Consultants:  None  Procedures:  None  Antibiotics:  . None  Anti-infectives (From admission, onward)   Start     Dose/Rate Route Frequency Ordered Stop   09/11/19 1800  fluconazole (DIFLUCAN) tablet 100 mg     100 mg Oral Daily 09/11/19 1620       Subjective: Today, patient  was seen and examined at bedside.  Denies any chest pain, shortness of breath, fever but feels dyspneic on exertion.  Objective: Vitals:   09/12/19 0230 09/12/19 0559  BP: (!) 138/95 138/81  Pulse: 67 61  Resp: 19 16  Temp: 97.6 F (36.4 C) 97.8 F (36.6 C)  SpO2: 96% 95%   No intake or output data in the 24 hours ending 09/12/19 1051 Filed Weights   09/11/19 0137  Weight: 121.1 kg   Body mass index is 38.31 kg/m.   Physical Exam: GENERAL:  Patient is alert awake and oriented. Not in obvious distress.  Morbidly obese.  On nasal cannula oxygen HENT: No scleral pallor or icterus. Pupils equally reactive to light. Oral mucosa is moist NECK: is supple, no gross swelling noted. CHEST: Diminished breath sounds bilaterally. CVS: S1 and S2 heard, no murmur. Regular rate and rhythm.  ABDOMEN: Soft, non-tender, bowel sounds are present. EXTREMITIES: Trace peripheral edema CNS: Cranial nerves are intact. No focal motor deficits. SKIN: warm and dry without rashes.  Data Review: I have personally reviewed the following laboratory data and studies,  CBC: Recent Labs  Lab 09/11/19 0318 09/12/19 0454  WBC 10.4 11.6*  NEUTROABS 5.5 9.7*  HGB 14.8 14.6  HCT 45.8 45.4  MCV 90.3 92.7  PLT 219 161   Basic Metabolic Panel: Recent Labs  Lab 09/11/19 0318 09/12/19 0454  NA 142 139  K 4.4 4.3  CL 103 101  CO2 25 25  GLUCOSE 101* 170*  BUN 26* 22*  CREATININE 0.96 0.80  CALCIUM 8.3* 9.0  MG  --  2.3   Liver Function Tests: Recent Labs  Lab 09/11/19 0318 09/12/19 0454  AST 25 23  ALT 42 50*  ALKPHOS 64 49  BILITOT 0.6 0.6  PROT 6.1* 6.3*  ALBUMIN 3.5 3.6   No results for input(s): LIPASE, AMYLASE in the last 168 hours. No results for input(s): AMMONIA in the last 168 hours. Cardiac Enzymes: No results for input(s): CKTOTAL, CKMB, CKMBINDEX, TROPONINI in the last 168 hours. BNP (last 3 results) Recent Labs    09/03/19 0423 09/05/19 1242 09/11/19 0319  BNP 43.0 71.4 39.2    ProBNP (last 3 results) No results for input(s): PROBNP in the last 8760 hours.  CBG: Recent Labs  Lab 09/05/19 1201  GLUCAP 120*   Recent Results (from the past 240 hour(s))  Blood Culture (routine x 2)     Status: None (Preliminary result)   Collection Time: 09/11/19  3:23 AM   Specimen: BLOOD  Result Value Ref Range Status   Specimen Description   Final    BLOOD RIGHT ANTECUBITAL Performed at [facility] [address] [place], [place] [address]    Special Requests   Final    BOTTLES DRAWN AEROBIC AND ANAEROBIC Blood Culture results may not be optimal due to an excessive volume of blood received in culture bottles Performed at [facility] [address] [place], [place] [address]    Culture   Final    NO GROWTH 1 DAY Performed at [facility], [place] [address] [place], [place] [address]    Report Status PENDING  Incomplete  Blood Culture (routine x 2)     Status: None (Preliminary result)   Collection Time: 09/11/19  4:06 AM   Specimen: BLOOD  Result Value Ref Range Status   Specimen Description   Final    BLOOD LEFT ARM Performed at [facility], [address] [place], [place] [address]    Special Requests   Final    BOTTLES DRAWN AEROBIC AND ANAEROBIC Blood Culture results may not be optimal due to an excessive volume of blood received in culture bottles Performed at [facility], [address] [place], [place] [address]    Culture   Final    NO GROWTH 1 DAY Performed at [facility], [address] [place], [place] [address]    Report Status PENDING  Incomplete     Studies: CT ANGIO CHEST PE W OR WO CONTRAST  Result Date: 09/11/2019 CLINICAL DATA:  Increasing shortness of breath.  COVID-19. EXAM: CT ANGIOGRAPHY CHEST WITH CONTRAST TECHNIQUE: Multidetector CT imaging of the chest was performed using the standard protocol during bolus administration of intravenous contrast. Multiplanar CT image reconstructions and MIPs were obtained to evaluate the vascular anatomy. CONTRAST:  OMNIPAQUE IOHEXOL 350 MG/ML SOLN COMPARISON:  08/28/2019 FINDINGS: Cardiovascular: Satisfactory opacification of the pulmonary arteries to the segmental level. No evidence of pulmonary embolism when accounting for extensive motion artifact. Normal heart size. No pericardial effusion. Mediastinum/Nodes: Negative for adenopathy or  inflammatory changes. Lungs/Pleura: Low volume chest with patchy ground-glass and streaky opacity correlating with the history. The degree of opacities improved from prior. No edema, effusion, or pneumothorax. Upper Abdomen: No acute abnormality. Musculoskeletal: No acute or aggressive finding Review of the MIP images confirms the above findings. IMPRESSION: 1. Extensive motion artifact.  No visible pulmonary embolism. 2. Bilateral pulmonary opacification correlating with history of COVID-19 positivity. The extent of disease is improved from 08/28/2019. Electronically Signed   By: Marnee Spring M.D.   On: 09/11/2019 05:40   DG Chest Port 1 View  Result Date: 09/11/2019 CLINICAL DATA:  Shortness of breath EXAM: PORTABLE CHEST 1 VIEW COMPARISON:  08/28/2019 FINDINGS: Low lung volumes. Partial but incomplete resolution of streaky pulmonary opacities. Normal heart size. No pneumothorax. IMPRESSION: Partial but incomplete resolution of streaky bilateral pulmonary opacities which may reflect slowly resolving pneumonia. Electronically Signed   By: Jasmine Pang M.D.   On: 09/11/2019 04:21      Joycelyn Das, MD  [facility] 09/12/2019

## 2019-09-13 ENCOUNTER — Ambulatory Visit: Payer: Self-pay

## 2019-09-13 DIAGNOSIS — M5136 Other intervertebral disc degeneration, lumbar region: Secondary | ICD-10-CM | POA: Diagnosis not present

## 2019-09-13 DIAGNOSIS — U071 COVID-19: Secondary | ICD-10-CM | POA: Diagnosis not present

## 2019-09-13 DIAGNOSIS — G894 Chronic pain syndrome: Secondary | ICD-10-CM | POA: Diagnosis not present

## 2019-09-13 DIAGNOSIS — F411 Generalized anxiety disorder: Secondary | ICD-10-CM | POA: Diagnosis not present

## 2019-09-13 DIAGNOSIS — J1282 Pneumonia due to coronavirus disease 2019: Secondary | ICD-10-CM

## 2019-09-13 DIAGNOSIS — F324 Major depressive disorder, single episode, in partial remission: Secondary | ICD-10-CM | POA: Diagnosis not present

## 2019-09-13 LAB — COMPREHENSIVE METABOLIC PANEL
ALT: 39 U/L (ref 0–44)
AST: 21 U/L (ref 15–41)
Albumin: 3.3 g/dL — ABNORMAL LOW (ref 3.5–5.0)
Alkaline Phosphatase: 55 U/L (ref 38–126)
Anion gap: 13 (ref 5–15)
BUN: 26 mg/dL — ABNORMAL HIGH (ref 6–20)
CO2: 22 mmol/L (ref 22–32)
Calcium: 8.3 mg/dL — ABNORMAL LOW (ref 8.9–10.3)
Chloride: 104 mmol/L (ref 98–111)
Creatinine, Ser: 0.95 mg/dL (ref 0.44–1.00)
GFR calc Af Amer: >60 mL/min (ref >60)
GFR calc non Af Amer: >60 mL/min (ref >60)
Glucose, Bld: 211 mg/dL — ABNORMAL HIGH (ref 70–99)
Potassium: 4.3 mmol/L (ref 3.5–5.1)
Sodium: 139 mmol/L (ref 135–145)
Total Bilirubin: 0.6 mg/dL (ref 0.3–1.2)
Total Protein: 5.6 g/dL — ABNORMAL LOW (ref 6.5–8.1)

## 2019-09-13 LAB — CBC WITH DIFFERENTIAL/PLATELET
Abs Immature Granulocytes: 0.15 10*3/uL — ABNORMAL HIGH (ref 0.00–0.07)
Basophils Absolute: 0 10*3/uL (ref 0.0–0.1)
Basophils Relative: 0 %
Eosinophils Absolute: 0 10*3/uL (ref 0.0–0.5)
Eosinophils Relative: 0 %
HCT: 43.8 % (ref 36.0–46.0)
Hemoglobin: 13.8 g/dL (ref 12.0–15.0)
Immature Granulocytes: 1 %
Lymphocytes Relative: 14 %
Lymphs Abs: 2.2 10*3/uL (ref 0.7–4.0)
MCH: 29.7 pg (ref 26.0–34.0)
MCHC: 31.5 g/dL (ref 30.0–36.0)
MCV: 94.4 fL (ref 80.0–100.0)
Monocytes Absolute: 1.1 10*3/uL — ABNORMAL HIGH (ref 0.1–1.0)
Monocytes Relative: 7 %
Neutro Abs: 12.1 10*3/uL — ABNORMAL HIGH (ref 1.7–7.7)
Neutrophils Relative %: 78 %
Platelets: 189 10*3/uL (ref 150–400)
RBC: 4.64 MIL/uL (ref 3.87–5.11)
RDW: 14.9 % (ref 11.5–15.5)
WBC: 15.6 10*3/uL — ABNORMAL HIGH (ref 4.0–10.5)
nRBC: 0 % (ref 0.0–0.2)

## 2019-09-13 LAB — C-REACTIVE PROTEIN: CRP: 2 mg/dL — ABNORMAL HIGH (ref <1.0)

## 2019-09-13 LAB — FERRITIN: Ferritin: 141 ng/mL (ref 11–307)

## 2019-09-13 LAB — D-DIMER, QUANTITATIVE: D-Dimer, Quant: <0.27 ug/mL-FEU (ref 0.00–0.50)

## 2019-09-13 MED ORDER — BUDESONIDE 0.25 MG/2ML IN SUSP: 0.2500 mg | Freq: Two times a day (BID) | RESPIRATORY_TRACT | Status: DC

## 2019-09-13 MED ORDER — ALBUTEROL SULFATE HFA 108 (90 BASE) MCG/ACT IN AERS
2.0000 | INHALATION_SPRAY | RESPIRATORY_TRACT | Status: DC
  Administered 2019-09-13 – 2019-09-15 (×14): 2 via RESPIRATORY_TRACT

## 2019-09-13 MED ORDER — FUROSEMIDE 10 MG/ML IJ SOLN
40.0000 mg | Freq: Once | INTRAMUSCULAR | Status: AC
  Administered 2019-09-13: 40 mg via INTRAVENOUS
  Filled 2019-09-13: qty 4

## 2019-09-13 NOTE — Progress Notes (Signed)
Patient tearful and anxious in room. Emotional support given. Patient voiced concerns with MD regarding plan of care moving forward. Charge nurse made aware of concerns.  VSS. Patient remains on 3L O2 via [place]. Shortness of breath noted with activity. Educated patient on incentive spirometry. Voiced understanding. Call light within reach. Will cont to mx.

## 2019-09-13 NOTE — Progress Notes (Signed)
Pt c/o heaviness to the chest and headache. Pt states it could be her anxiety. On call paged EKG done hard copy is in Pt chart. No new orders at this time. Will continue to monitor.

## 2019-09-13 NOTE — Progress Notes (Addendum)
PROGRESS NOTE  [name] FQH:[id number] DOB: [birth year] DOA: 09/11/2019 PCP: [name], MD   LOS: 1 day   Brief narrative: As per HPI,  [name] is a 44 y.o. female with medical history significant for BMI 38, chronic back pain, depression, anxiety, GERD, and hypertension, recent diagnosis of COVID-19 on 08/24/2019 and was recently discharged after treatment on 09/05/2019 in improved condition presented to the hospital with worsening shortness of breath cough and fatigue with dyspnea on exertion and hypoxia on ambulation.   She continued steroids at home and finished a course of 1 day prior to presentation and had also been using inhalers with some fleeting relief.  She has monitored oxygen saturations at home and notes consistent drops into the mid 80s with mild exertion.  ED Course: Upon arrival to the ED, patient is found to be afebrile, saturating 92% on room air while at rest, tachypneic in the mid 20s, tachycardic to 130, and with stable blood pressure.  EKG features sinus tachycardia with rate 126 and PVC.  CTA chest is limited by motion artifact but no PE was seen.  Bilateral opacities seem to have improved some on the CTA.  Chemistry panel notable for an elevated BUN to creatinine ratio.  CBC unremarkable.  Lactic acid normal.  Procalcitonin undetectable and D-dimer is normal.  CRP is elevated to 1.9.  Patient was placed on supplemental oxygen in the ED and hospitalist consulted for admission.  Assessment/Plan:  Principal Problem:   Respiratory tract infection due to COVID-19 virus Active Problems:   ANXIETY DISORDER, GENERALIZED   Depression   HTN (hypertension)   Spinal stenosis of lumbar region   DDD (degenerative disc disease), lumbar   Chronic pain   Pneumonia due to COVID-19 virus   Recent COVID-19 pneumonia, presented with shortness of breath, dyspnea on minimal exertion cough and hypoxia at home with saturation in the mid 80s.  Now with acute hypoxic  respiratory failure.  Possibility of post Covid inflammatory disease, less likely to be reactive airway disease, unlikely to be bacterial pneumonia.  No fever, Procalcitonin negative.  On 4 L of oxygen. CT angiogram without worsening findings.  No evidence of pulmonary embolism.  On 2 L of nasal cannula saturating 95%.  Procalcitonin negative.  No fever or chills.  Hold off with antibiotic.  Continue IV steroids for now. Wean oxygen as able.  Continue albuterol, incentive spirometer.  BNP 39.  We will give 1 dose of IV Lasix today.  Add inhaled steroid.  Patient was encouraged to use incentive spirometry.  Spoke with the nursing staff about it.  COVID-19 Labs  Recent Labs    09/11/19 0318 09/12/19 0454 09/13/19 0459  DDIMER 0.29 <0.27 <0.27  FERRITIN 168 174 141  LDH 191  --   --   CRP 1.9* 1.8* 2.0*    Lab Results  Component Value Date   SARSCOV2NAA POSITIVE (A) 08/28/2019   SARSCOV2NAA Not Detected 12/15/2018    History of fibromyalgia.  On oxycodone.  Diastolic dysfunction on echo.  Unlikely to be heart failure.  Was on HCTZ due to edema from gabapentin.  Add 1 more dose of Lasix today.  Oral thrush, sore throat.  Continue oral fluconazole, nystatin swish and swallow.  Slightly improved  Chest wall pain.  Still has chest wall discomfort. History of fibromyalgia.  Focus on analgesia regimen.  EKG showed normal sinus rhythm.  Morbid obesity.  At baseline patient denies any dyspnea on exertion or at rest.  VTE Prophylaxis: Lovenox subcu  Code Status: Full code  Family Communication:   Disposition Plan:  . Status inpatient due to  acute hypoxic respiratory failure secondary to pneumonia . Patient is from home . Likely disposition to home likely in 2 to 3 days.  Might need oxygen on discharge. . Barriers to discharge: Post Covid acute hypoxic respiratory failure, still very symptomatic and dyspnea on  exertion   Consultants:  None  Procedures:  None  Antibiotics:  . None  Anti-infectives (From admission, onward)   Start     Dose/Rate Route Frequency Ordered Stop   09/11/19 1800  fluconazole (DIFLUCAN) tablet 100 mg     100 mg Oral Daily 09/11/19 1620       Subjective: Today, patient was seen and examined at bedside.  Has dyspnea on exertion.  Has mild discomfort.  Patient appears to be anxious.  Objective: Vitals:   09/13/19 0410 09/13/19 1000  BP: (!) 133/43   Pulse: 64   Resp: 18   Temp: 98 F (36.7 C)   SpO2: 94% 90%   No intake or output data in the 24 hours ending 09/13/19 1218 Filed Weights   09/11/19 0137  Weight: 121.1 kg   Body mass index is 38.31 kg/m.   Physical Exam: GENERAL: Patient is alert awake and oriented.   Morbidly obese.  On nasal cannula oxygen.  Appears anxious HENT: No scleral pallor or icterus. Pupils equally reactive to light. Oral mucosa is moist NECK: is supple, no gross swelling noted. CHEST: Diminished breath sounds bilaterally.  No overt wheezes or crackles noted CVS: S1 and S2 heard, no murmur. Regular rate and rhythm.  ABDOMEN: Soft, non-tender, bowel sounds are present. EXTREMITIES: Trace peripheral edema CNS: Cranial nerves are intact. No focal motor deficits. SKIN: warm and dry without rashes.  Data Review: I have personally reviewed the following laboratory data and studies,  CBC: Recent Labs  Lab 09/11/19 0318 09/12/19 0454 09/13/19 0459  WBC 10.4 11.6* 15.6*  NEUTROABS 5.5 9.7* 12.1*  HGB 14.8 14.6 13.8  HCT 45.8 45.4 43.8  MCV 90.3 92.7 94.4  PLT 219 175 967   Basic Metabolic Panel: Recent Labs  Lab 09/11/19 0318 09/12/19 0454 09/13/19 0459  NA 142 139 139  K 4.4 4.3 4.3  CL 103 101 104  CO2 25 25 22   GLUCOSE 101* 170* 211*  BUN 26* 22* 26*  CREATININE 0.96 0.80 0.95  CALCIUM 8.3* 9.0 8.3*  MG  --  2.3  --    Liver Function Tests: Recent Labs  Lab 09/11/19 0318 09/12/19 0454  09/13/19 0459  AST 25 23 21   ALT 42 50* 39  ALKPHOS 64 49 55  BILITOT 0.6 0.6 0.6  PROT 6.1* 6.3* 5.6*  ALBUMIN 3.5 3.6 3.3*   No results for input(s): LIPASE, AMYLASE in the last 168 hours. No results for input(s): AMMONIA in the last 168 hours. Cardiac Enzymes: No results for input(s): CKTOTAL, CKMB, CKMBINDEX, TROPONINI in the last 168 hours. BNP (last 3 results) Recent Labs    09/03/19 0423 09/05/19 1242 09/11/19 0319  BNP 43.0 71.4 39.2    ProBNP (last 3 results) No results for input(s): PROBNP in the last 8760 hours.  CBG: No results for input(s): GLUCAP in the last 168 hours. Recent Results (from the past 240 hour(s))  Blood Culture (routine x 2)     Status: None (Preliminary result)   Collection Time: 09/11/19  3:23 AM   Specimen: BLOOD  Result Value Ref  Range Status   Specimen Description   Final    BLOOD RIGHT ANTECUBITAL Performed at [facility], [address] [place], [place] [address]    Special Requests   Final    BOTTLES DRAWN AEROBIC AND ANAEROBIC Blood Culture results may not be optimal due to an excessive volume of blood received in culture bottles Performed at [facility], [address], [place], [place] [address]    Culture   Final    NO GROWTH 1 DAY Performed at [facility], [address], [place], [place] [address]    Report Status PENDING  Incomplete  Blood Culture (routine x 2)     Status: None (Preliminary result)   Collection Time: 09/11/19  4:06 AM   Specimen: BLOOD  Result Value Ref Range Status   Specimen Description   Final    BLOOD LEFT ARM Performed at [facility], [address], [place], [place] [address]    Special Requests   Final    BOTTLES DRAWN AEROBIC AND ANAEROBIC Blood Culture results may not be optimal due to an excessive volume of blood received in culture bottles Performed at [facility], [address], [place], [place]  [address]    Culture   Final    NO GROWTH 1 DAY Performed at [facility], [address], [place], [place] [address]    Report Status PENDING  Incomplete     Studies: No results found.    Joycelyn Das, MD  [facility] 09/13/2019

## 2019-09-14 LAB — CBC WITH DIFFERENTIAL/PLATELET
Abs Immature Granulocytes: 0.22 10*3/uL — ABNORMAL HIGH (ref 0.00–0.07)
Basophils Absolute: 0 10*3/uL (ref 0.0–0.1)
Basophils Relative: 0 %
Eosinophils Absolute: 0 10*3/uL (ref 0.0–0.5)
Eosinophils Relative: 0 %
HCT: 42.4 % (ref 36.0–46.0)
Hemoglobin: 13.8 g/dL (ref 12.0–15.0)
Immature Granulocytes: 2 %
Lymphocytes Relative: 18 %
Lymphs Abs: 2.2 10*3/uL (ref 0.7–4.0)
MCH: 30.1 pg (ref 26.0–34.0)
MCHC: 32.5 g/dL (ref 30.0–36.0)
MCV: 92.6 fL (ref 80.0–100.0)
Monocytes Absolute: 1.2 10*3/uL — ABNORMAL HIGH (ref 0.1–1.0)
Monocytes Relative: 10 %
Neutro Abs: 8.4 10*3/uL — ABNORMAL HIGH (ref 1.7–7.7)
Neutrophils Relative %: 70 %
Platelets: 162 10*3/uL (ref 150–400)
RBC: 4.58 MIL/uL (ref 3.87–5.11)
RDW: 15 % (ref 11.5–15.5)
WBC: 12 10*3/uL — ABNORMAL HIGH (ref 4.0–10.5)
nRBC: 0 % (ref 0.0–0.2)

## 2019-09-14 LAB — COMPREHENSIVE METABOLIC PANEL
ALT: 43 U/L (ref 0–44)
AST: 25 U/L (ref 15–41)
Albumin: 3.2 g/dL — ABNORMAL LOW (ref 3.5–5.0)
Alkaline Phosphatase: 55 U/L (ref 38–126)
Anion gap: 14 (ref 5–15)
BUN: 25 mg/dL — ABNORMAL HIGH (ref 6–20)
CO2: 24 mmol/L (ref 22–32)
Calcium: 8.5 mg/dL — ABNORMAL LOW (ref 8.9–10.3)
Chloride: 102 mmol/L (ref 98–111)
Creatinine, Ser: 1.02 mg/dL — ABNORMAL HIGH (ref 0.44–1.00)
GFR calc Af Amer: >60 mL/min (ref >60)
GFR calc non Af Amer: >60 mL/min (ref >60)
Glucose, Bld: 201 mg/dL — ABNORMAL HIGH (ref 70–99)
Potassium: 4.4 mmol/L (ref 3.5–5.1)
Sodium: 140 mmol/L (ref 135–145)
Total Bilirubin: 0.3 mg/dL (ref 0.3–1.2)
Total Protein: 5.5 g/dL — ABNORMAL LOW (ref 6.5–8.1)

## 2019-09-14 LAB — D-DIMER, QUANTITATIVE: D-Dimer, Quant: <0.27 ug/mL-FEU (ref 0.00–0.50)

## 2019-09-14 LAB — FERRITIN: Ferritin: 115 ng/mL (ref 11–307)

## 2019-09-14 LAB — C-REACTIVE PROTEIN: CRP: 1.8 mg/dL — ABNORMAL HIGH (ref <1.0)

## 2019-09-14 MED ORDER — FUROSEMIDE 10 MG/ML IJ SOLN
40.0000 mg | Freq: Once | INTRAMUSCULAR | Status: AC
  Administered 2019-09-14: 40 mg via INTRAVENOUS
  Filled 2019-09-14: qty 4

## 2019-09-14 MED ORDER — DEXAMETHASONE SODIUM PHOSPHATE 10 MG/ML IJ SOLN
6.0000 mg | INTRAMUSCULAR | Status: DC
  Administered 2019-09-14 – 2019-09-15 (×2): 6 mg via INTRAVENOUS
  Filled 2019-09-14 (×2): qty 1

## 2019-09-14 MED ORDER — ZOLPIDEM TARTRATE 5 MG PO TABS
5.0000 mg | ORAL_TABLET | Freq: Every evening | ORAL | Status: DC | PRN
  Administered 2019-09-15: 5 mg via ORAL
  Filled 2019-09-14: qty 1

## 2019-09-14 NOTE — Progress Notes (Addendum)
SATURATION QUALIFICATIONS: (This note is used to comply with regulatory documentation for home oxygen)  Patient Saturations on Room Air at Rest = 93%  Patient Saturations on Room Air while Ambulating = 87%  Patient Saturations on 2 Liters of oxygen while Ambulating = 94% 

## 2019-09-14 NOTE — Progress Notes (Signed)
PROGRESS NOTE  [name]:[id number] DOB: 1976/03/06 DOA: 09/11/2019 PCP: [name], MD   LOS: 2 days   Brief narrative: As per HPI,  [name] is a 44 y.o. female with medical history significant for BMI 38, chronic back pain, depression, anxiety, GERD, and hypertension, recent diagnosis of COVID-19 on 08/24/2019 and was recently discharged after treatment on 09/05/2019 in improved condition presented to the hospital with worsening shortness of breath, cough and fatigue with dyspnea on exertion and hypoxia on ambulation.   She continued steroids at home and finished a course of 1 day prior to presentation and had also been using inhalers with some fleeting relief.  She has monitored oxygen saturations at home and notes consistent drops into the mid 80s with mild exertion.  ED Course: Upon arrival to the ED, patient is found to be afebrile, saturating 92% on room air while at rest, tachypneic in the mid 20s, tachycardic to 130, and with stable blood pressure.  EKG features sinus tachycardia with rate 126 and PVC.  CTA chest is limited by motion artifact but no PE was seen.  Bilateral opacities seem to have improved some on the CTA.  Chemistry panel notable for an elevated BUN to creatinine ratio.  CBC unremarkable.  Lactic acid normal.  Procalcitonin undetectable and D-dimer is normal.  CRP is elevated to 1.9.  Patient was placed on supplemental oxygen in the ED and hospitalist consulted for admission.  Assessment/Plan:  Principal Problem:   Respiratory tract infection due to COVID-19 virus Active Problems:   ANXIETY DISORDER, GENERALIZED   Depression   HTN (hypertension)   Spinal stenosis of lumbar region   DDD (degenerative disc disease), lumbar   Chronic pain   Pneumonia due to COVID-19 virus   Recent COVID-19 pneumonia, presented with shortness of breath, dyspnea on minimal exertion cough and hypoxia at home with saturation in the mid 80s.  Now with acute hypoxic  respiratory failure.  Possibility of post Covid inflammatory disease, less likely to be reactive airway disease, unlikely to be bacterial pneumonia.  No fever, Procalcitonin negative.  On 3 L of oxygen and desaturates whenever she ambulates. CT angiogram of the chest without worsening findings.  No evidence of pulmonary embolism. .  Procalcitonin negative.  No fever or chills.  Hold off with antibiotic.  Continue IV steroids for now. Will change back to dexamethasone since Solu-Medrol affected her sleeping. We will continue to wean oxygen as able.  Continue albuterol, incentive spirometer. Flutter valve was given to the patient. BNP 39. Received IV Lasix yesterday and is on inhaled steroid.  Patient was encouraged to use incentive spirometry.  Spoke with the nursing staff regarding possible need for oxygen on discharge and the need for weaning trial. We will ambulate the patient as able.   COVID-19 Labs  Recent Labs    09/12/19 0454 09/13/19 0459 09/14/19 0442  DDIMER <0.27 <0.27 <0.27  FERRITIN 174 141 115  CRP 1.8* 2.0* 1.8*    Lab Results  Component Value Date   SARSCOV2NAA POSITIVE (A) 08/28/2019   SARSCOV2NAA Not Detected 12/15/2018    History of fibromyalgia.  On oxycodone.  Diastolic dysfunction on echo.  Unlikely to be heart failure.  Was on HCTZ due to edema from gabapentin.  Add 1 more dose of Lasix today.  Oral thrush, sore throat.  Continue oral fluconazole, nystatin swish and swallow.  improved  Chest wall pain. Atypical. EKG showed normal sinus rhythm.  Morbid obesity.  At baseline,  patient denies any dyspnea on exertion or at rest.  VTE Prophylaxis: Lovenox subcu  Code Status: Full code  Family Communication: Spoke with the patient's husband Mr. [name] on the phone and updated him about the clinical condition of the patient and the plan for possible disposition home by tomorrow  Disposition Plan:  . Status inpatient due to  acute hypoxic respiratory failure  secondary to pneumonia . Patient is from home . Likely disposition to home likely in 1 to 2 days, might need oxygen on discharge. . Barriers to discharge: Post Covid acute hypoxic respiratory failure, still very symptomatic and dyspnea on exertion, might need oxygen on discharge. Spoke with transition of care about possible oxygen need on discharge   Consultants:  None  Procedures:  None  Antibiotics:  . None  Anti-infectives (From admission, onward)   Start     Dose/Rate Route Frequency Ordered Stop   09/11/19 1800  fluconazole (DIFLUCAN) tablet 100 mg     100 mg Oral Daily 09/11/19 1620       Subjective: Today, patient was seen and examined at bedside.  Objective: Vitals:   09/13/19 2057 09/14/19 0603  BP: 133/80 135/80  Pulse: 66 68  Resp:  20  Temp: 98.7 F (37.1 C) 98.6 F (37 C)  SpO2:  97%    Intake/Output Summary (Last 24 hours) at 09/14/2019 1150 Last data filed at 09/14/2019 1000 Gross per 24 hour  Intake 300 ml  Output --  Net 300 ml   Filed Weights   09/11/19 0137  Weight: 121.1 kg   Body mass index is 38.31 kg/m.   Physical Exam: General: Alert awake, morbidly obese on nasal cannula oxygen, appears anxious, not in obvious distress HENT: Normocephalic, pupils equally reacting to light and accommodation.  No scleral pallor or icterus noted. Oral mucosa is moist.  Chest:  Diminished breath sounds bilaterally. No crackles or wheezes.  CVS: S1 &S2 heard. No murmur.  Regular rate and rhythm. Abdomen: Soft, nontender, nondistended.  Bowel sounds are heard.   Extremities: No cyanosis, clubbing but trace peripheral edema, peripheral pulses are palpable. Psych: Alert, awake and oriented, normal mood CNS:  No cranial nerve deficits.  Power equal in all extremities.  Skin: Warm and dry.  No rashes noted.  Data Review: I have personally reviewed the following laboratory data and studies,  CBC: Recent Labs  Lab 09/11/19 0318 09/12/19 0454  09/13/19 0459 09/14/19 0442  WBC 10.4 11.6* 15.6* 12.0*  NEUTROABS 5.5 9.7* 12.1* 8.4*  HGB 14.8 14.6 13.8 13.8  HCT 45.8 45.4 43.8 42.4  MCV 90.3 92.7 94.4 92.6  PLT 219 175 189 854   Basic Metabolic Panel: Recent Labs  Lab 09/11/19 0318 09/12/19 0454 09/13/19 0459 09/14/19 0442  NA 142 139 139 140  K 4.4 4.3 4.3 4.4  CL 103 101 104 102  CO2 25 25 22 24   GLUCOSE 101* 170* 211* 201*  BUN 26* 22* 26* 25*  CREATININE 0.96 0.80 0.95 1.02*  CALCIUM 8.3* 9.0 8.3* 8.5*  MG  --  2.3  --   --    Liver Function Tests: Recent Labs  Lab 09/11/19 0318 09/12/19 0454 09/13/19 0459 09/14/19 0442  AST 25 23 21 25   ALT 42 50* 39 43  ALKPHOS 64 49 55 55  BILITOT 0.6 0.6 0.6 0.3  PROT 6.1* 6.3* 5.6* 5.5*  ALBUMIN 3.5 3.6 3.3* 3.2*   No results for input(s): LIPASE, AMYLASE in the last 168 hours. No results for input(s):  AMMONIA in the last 168 hours. Cardiac Enzymes: No results for input(s): CKTOTAL, CKMB, CKMBINDEX, TROPONINI in the last 168 hours. BNP (last 3 results) Recent Labs    09/03/19 0423 09/05/19 1242 09/11/19 0319  BNP 43.0 71.4 39.2    ProBNP (last 3 results) No results for input(s): PROBNP in the last 8760 hours.  CBG: No results for input(s): GLUCAP in the last 168 hours. Recent Results (from the past 240 hour(s))  Blood Culture (routine x 2)     Status: None (Preliminary result)   Collection Time: 09/11/19  3:23 AM   Specimen: BLOOD  Result Value Ref Range Status   Specimen Description BLOOD RIGHT ANTECUBITAL  Final   Special Requests   Final    BOTTLES DRAWN AEROBIC AND ANAEROBIC Blood Culture results may not be optimal due to an excessive volume of blood received in culture bottles Performed at [facility], [address] [place], [place] [address]    Culture NO GROWTH 2 DAYS  Final   Report Status PENDING  Incomplete  Blood Culture (routine x 2)     Status: None (Preliminary result)   Collection Time: 09/11/19  4:06 AM    Specimen: BLOOD  Result Value Ref Range Status   Specimen Description BLOOD LEFT ARM  Final   Special Requests   Final    BOTTLES DRAWN AEROBIC AND ANAEROBIC Blood Culture results may not be optimal due to an excessive volume of blood received in culture bottles Performed at [facility], [address] [place], [place] [address]    Culture NO GROWTH 2 DAYS  Final   Report Status PENDING  Incomplete     Studies: No results found.    Joycelyn Das, MD  [facility] 09/14/2019

## 2019-09-14 NOTE — Progress Notes (Signed)
Pt. ordered Pulmicort 0.25 mg BID and is currently on Isolation for + COVID results, would be best practice to have Dulera INH. ordered in it's place.

## 2019-09-15 LAB — COMPREHENSIVE METABOLIC PANEL
ALT: 38 U/L (ref 0–44)
AST: 21 U/L (ref 15–41)
Albumin: 3.3 g/dL — ABNORMAL LOW (ref 3.5–5.0)
Alkaline Phosphatase: 67 U/L (ref 38–126)
Anion gap: 12 (ref 5–15)
BUN: 32 mg/dL — ABNORMAL HIGH (ref 6–20)
CO2: 25 mmol/L (ref 22–32)
Calcium: 8.4 mg/dL — ABNORMAL LOW (ref 8.9–10.3)
Chloride: 105 mmol/L (ref 98–111)
Creatinine, Ser: 1.08 mg/dL — ABNORMAL HIGH (ref 0.44–1.00)
GFR calc Af Amer: >60 mL/min (ref >60)
GFR calc non Af Amer: >60 mL/min (ref >60)
Glucose, Bld: 142 mg/dL — ABNORMAL HIGH (ref 70–99)
Potassium: 4.6 mmol/L (ref 3.5–5.1)
Sodium: 142 mmol/L (ref 135–145)
Total Bilirubin: 0.9 mg/dL (ref 0.3–1.2)
Total Protein: 5.5 g/dL — ABNORMAL LOW (ref 6.5–8.1)

## 2019-09-15 LAB — CBC WITH DIFFERENTIAL/PLATELET
Abs Immature Granulocytes: 0.36 10*3/uL — ABNORMAL HIGH (ref 0.00–0.07)
Basophils Absolute: 0 10*3/uL (ref 0.0–0.1)
Basophils Relative: 0 %
Eosinophils Absolute: 0 10*3/uL (ref 0.0–0.5)
Eosinophils Relative: 0 %
HCT: 44.3 % (ref 36.0–46.0)
Hemoglobin: 14.4 g/dL (ref 12.0–15.0)
Immature Granulocytes: 2 %
Lymphocytes Relative: 13 %
Lymphs Abs: 2 10*3/uL (ref 0.7–4.0)
MCH: 30.6 pg (ref 26.0–34.0)
MCHC: 32.5 g/dL (ref 30.0–36.0)
MCV: 94.3 fL (ref 80.0–100.0)
Monocytes Absolute: 1.7 10*3/uL — ABNORMAL HIGH (ref 0.1–1.0)
Monocytes Relative: 11 %
Neutro Abs: 11.2 10*3/uL — ABNORMAL HIGH (ref 1.7–7.7)
Neutrophils Relative %: 74 %
Platelets: 213 10*3/uL (ref 150–400)
RBC: 4.7 MIL/uL (ref 3.87–5.11)
RDW: 15.3 % (ref 11.5–15.5)
WBC: 15.3 10*3/uL — ABNORMAL HIGH (ref 4.0–10.5)
nRBC: 0.2 % (ref 0.0–0.2)

## 2019-09-15 LAB — D-DIMER, QUANTITATIVE: D-Dimer, Quant: 0.35 ug/mL-FEU (ref 0.00–0.50)

## 2019-09-15 LAB — FERRITIN: Ferritin: 103 ng/mL (ref 11–307)

## 2019-09-15 LAB — C-REACTIVE PROTEIN: CRP: 1.6 mg/dL — ABNORMAL HIGH (ref <1.0)

## 2019-09-15 MED ORDER — MOMETASONE FURO-FORMOTEROL FUM 100-5 MCG/ACT IN AERO: 2.0000 | INHALATION_SPRAY | Freq: Two times a day (BID) | RESPIRATORY_TRACT | 0 refills | Status: DC

## 2019-09-15 MED ORDER — FUROSEMIDE 20 MG PO TABS: 40.0000 mg | ORAL_TABLET | Freq: Every day | ORAL | 0 refills | Status: DC

## 2019-09-15 MED ORDER — GUAIFENESIN-DM 100-10 MG/5ML PO SYRP: 10.0000 mL | ORAL_SOLUTION | ORAL | 0 refills | Status: DC | PRN

## 2019-09-15 MED ORDER — IPRATROPIUM-ALBUTEROL 20-100 MCG/ACT IN AERS: 2.0000 | INHALATION_SPRAY | Freq: Three times a day (TID) | RESPIRATORY_TRACT | 0 refills | Status: DC

## 2019-09-15 MED ORDER — NYSTATIN 100000 UNIT/ML MT SUSP: 5.0000 mL | Freq: Four times a day (QID) | OROMUCOSAL | 0 refills | Status: DC

## 2019-09-15 MED ORDER — DEXAMETHASONE 6 MG PO TABS: 6.0000 mg | ORAL_TABLET | Freq: Two times a day (BID) | ORAL | 0 refills | Status: DC

## 2019-09-15 MED ORDER — FLUCONAZOLE 100 MG PO TABS: 100.0000 mg | ORAL_TABLET | Freq: Every day | ORAL | 0 refills | Status: AC

## 2019-09-15 NOTE — Discharge Summary (Signed)
Physician Discharge Summary  [name]:[id number] DOB: [birth year] DOA: 09/11/2019  PCP: [name], MD  Admit date: 09/11/2019 Discharge date: 09/15/2019  Admitted From: Home  Discharge disposition: Home   Recommendations for Outpatient Follow-Up:   . Follow up with your primary care provider in one week.  . Check CBC, BMP, magnesium, chest x-ray in the next visit  Discharge Diagnosis:   Principal Problem:   Respiratory tract infection due to COVID-19 virus Active Problems:   ANXIETY DISORDER, GENERALIZED   Depression   HTN (hypertension)   Spinal stenosis of lumbar region   DDD (degenerative disc disease), lumbar   Chronic pain   Pneumonia due to COVID-19 virus   Discharge Condition: Improved.  Diet recommendation: Low sodium, heart healthy.  Wound care: None.  Code status: Full.   History of Present Illness:   [name] a 44 y.o.femalewith medical history significant forBMI 38, chronic back pain, depression, anxiety, GERD, and hypertension, recent diagnosis of COVID-19 on 08/24/2019 and was recently discharged after treatment on 09/05/2019 in improved condition presented to the hospital with worsening shortness of breath, cough and fatigue with dyspnea on exertion and hypoxia on ambulation.  She continued steroids at home and finished a course of 1 day prior to presentation and had also been using inhalers with some fleeting relief. She has monitored oxygen saturations at home and notes consistent drops into the mid 80s with mild exertion. ED Course:Upon arrival to the ED, patient is found to be afebrile, saturating 92% on room air while at rest, tachypneic in the mid 20s, tachycardic to 130, and with stable blood pressure. EKG features sinus tachycardia with rate 126 and PVC. CTA chest is limited by motion artifact but no PE was seen. Bilateral opacities seem to have improved some on the CTA. Chemistry panel notable for an elevated BUN to  creatinine ratio. CBC unremarkable. Lactic acid normal. Procalcitonin undetectable and D-dimer is normal. CRP is elevated to 1.9.Patient was placed on supplemental oxygen in the ED and hospitalist consulted for admission   Hospital Course:   Following conditions were addressed during hospitalization as listed below,  Recent COVID-19 pneumonia with acute hypoxic respiratory failure,presented with shortness of breath,dyspnea on minimal exertion cough and hypoxia at home with saturation in the mid 80s.    Possibility of post Covid inflammatory disease.  No fever, Procalcitonin was negative and no signs of bacterial pneumonia.  Patient was on  3 L of oxygen during hospitalization and desaturated whenever she ambulated.  She will be arranged for oxygen on discharge. CT angiogram of the chest without worsening findings. No evidence of pulmonary embolism.  Patient was continued on IV steroids, inhaled steroids, incentive spirometry, flutter valve and IV Lasix empirically. Patient was explained that the patient could have this post Covid syndrome with need for oxygen for a while.  Patient does have history of fibromyalgia, morbid obesity, anxiety and chronic pain syndrome with obesity which could be contributing to her dyspnea as well.  History of fibromyalgia. On oxycodone.  Will be continued on discharge.  Diastolic dysfunction on echo.  Unlikely to be heart failure.  Was on HCTZ due to edema from gabapentin.    After discussion with the patient patient will be given Lasix for a few more days on discharge.  Will resume HCTZ after Lasix.  Oral thrush, sore throat.  Continue oral fluconazole, nystatin swish and swallow during steroid.  Chest wall pain. Atypical. EKG showed normal sinus rhythm.  Patient does  have history of fibromyalgia.  Morbid obesity.  At baseline, patient denies any dyspnea on exertion or at rest.  Disposition.  At this time, patient is stable for disposition home.   Patient was advised to follow-up with her primary care physician as outpatient.  She was advised to continue oxygen at home and check her oxygen levels frequently.  Medical Consultants:    None.  Procedures:    None Subjective:   Today, patient with vague symptoms including chest discomfort.  Patient had oxygen desaturation on ambulation.  Discharge Exam:   Vitals:   09/15/19 0422 09/15/19 1210  BP: (!) 155/93   Pulse: 89   Resp: 18   Temp: 98.7 F (37.1 C)   SpO2: 97% 94%   Vitals:   09/14/19 2100 09/14/19 2200 09/15/19 0422 09/15/19 1210  BP:   (!) 155/93   Pulse: 84 78 89   Resp: 18 17 18    Temp:   98.7 F (37.1 C)   TempSrc:   Oral   SpO2: 93% 94% 97% 94%  Weight:      Height:       General: Alert awake, not in obvious distress, anxious, morbidly obese HENT: pupils equally reacting to light,  No scleral pallor or icterus noted. Oral mucosa is moist.  Chest: Diminished breath sounds bilaterally.   CVS: S1 &S2 heard. No murmur.  Regular rate and rhythm. Abdomen: Soft, nontender, nondistended.  Bowel sounds are heard.   Extremities: No cyanosis, clubbing or edema.  Peripheral pulses are palpable. Psych: Alert, awake and oriented, mildly anxious CNS:  No cranial nerve deficits.  Power equal in all extremities.   Skin: Warm and dry.  No rashes noted.  The results of significant diagnostics from this hospitalization (including imaging, microbiology, ancillary and laboratory) are listed below for reference.     Diagnostic Studies:   CT ANGIO CHEST PE W OR WO CONTRAST  Result Date: 09/11/2019 CLINICAL DATA:  Increasing shortness of breath.  COVID-19. EXAM: CT ANGIOGRAPHY CHEST WITH CONTRAST TECHNIQUE: Multidetector CT imaging of the chest was performed using the standard protocol during bolus administration of intravenous contrast. Multiplanar CT image reconstructions and MIPs were obtained to evaluate the vascular anatomy. CONTRAST:  112mL OMNIPAQUE IOHEXOL 350  MG/ML SOLN COMPARISON:  08/28/2019 FINDINGS: Cardiovascular: Satisfactory opacification of the pulmonary arteries to the segmental level. No evidence of pulmonary embolism when accounting for extensive motion artifact. Normal heart size. No pericardial effusion. Mediastinum/Nodes: Negative for adenopathy or inflammatory changes. Lungs/Pleura: Low volume chest with patchy ground-glass and streaky opacity correlating with the history. The degree of opacities improved from prior. No edema, effusion, or pneumothorax. Upper Abdomen: No acute abnormality. Musculoskeletal: No acute or aggressive finding Review of the MIP images confirms the above findings. IMPRESSION: 1. Extensive motion artifact.  No visible pulmonary embolism. 2. Bilateral pulmonary opacification correlating with history of COVID-19 positivity. The extent of disease is improved from 08/28/2019. Electronically Signed   By: Monte Fantasia M.D.   On: 09/11/2019 05:40   DG Chest Port 1 View  Result Date: 09/11/2019 CLINICAL DATA:  Shortness of breath EXAM: PORTABLE CHEST 1 VIEW COMPARISON:  08/28/2019 FINDINGS: Low lung volumes. Partial but incomplete resolution of streaky pulmonary opacities. Normal heart size. No pneumothorax. IMPRESSION: Partial but incomplete resolution of streaky bilateral pulmonary opacities which may reflect slowly resolving pneumonia. Electronically Signed   By: Donavan Foil M.D.   On: 09/11/2019 04:21     Labs:   Basic Metabolic Panel: Recent Labs  Lab  09/11/19 0318 09/11/19 0318 09/12/19 0454 09/12/19 0454 09/13/19 0459 09/13/19 0459 09/14/19 0442 09/15/19 0400  NA 142  --  139  --  139  --  140 142  K 4.4   < > 4.3   < > 4.3   < > 4.4 4.6  CL 103  --  101  --  104  --  102 105  CO2 25  --  25  --  22  --  24 25  GLUCOSE 101*  --  170*  --  211*  --  201* 142*  BUN 26*  --  22*  --  26*  --  25* 32*  CREATININE 0.96  --  0.80  --  0.95  --  1.02* 1.08*  CALCIUM 8.3*  --  9.0  --  8.3*  --  8.5* 8.4*    MG  --   --  2.3  --   --   --   --   --    < > = values in this interval not displayed.   GFR Estimated Creatinine Clearance: 92.9 mL/min (A) (by C-G formula based on SCr of 1.08 mg/dL (H)). Liver Function Tests: Recent Labs  Lab 09/11/19 0318 09/12/19 0454 09/13/19 0459 09/14/19 0442 09/15/19 0400  AST 25 23 21 25 21   ALT 42 50* 39 43 38  ALKPHOS 64 49 55 55 67  BILITOT 0.6 0.6 0.6 0.3 0.9  PROT 6.1* 6.3* 5.6* 5.5* 5.5*  ALBUMIN 3.5 3.6 3.3* 3.2* 3.3*   No results for input(s): LIPASE, AMYLASE in the last 168 hours. No results for input(s): AMMONIA in the last 168 hours. Coagulation profile No results for input(s): INR, PROTIME in the last 168 hours.  CBC: Recent Labs  Lab 09/11/19 0318 09/12/19 0454 09/13/19 0459 09/14/19 0442 09/15/19 0400  WBC 10.4 11.6* 15.6* 12.0* 15.3*  NEUTROABS 5.5 9.7* 12.1* 8.4* 11.2*  HGB 14.8 14.6 13.8 13.8 14.4  HCT 45.8 45.4 43.8 42.4 44.3  MCV 90.3 92.7 94.4 92.6 94.3  PLT 219 175 189 162 213   Cardiac Enzymes: No results for input(s): CKTOTAL, CKMB, CKMBINDEX, TROPONINI in the last 168 hours. BNP: Invalid input(s): POCBNP CBG: No results for input(s): GLUCAP in the last 168 hours. D-Dimer Recent Labs    09/14/19 0442 09/15/19 0400  DDIMER <0.27 0.35   Hgb A1c No results for input(s): HGBA1C in the last 72 hours. Lipid Profile No results for input(s): CHOL, HDL, LDLCALC, TRIG, CHOLHDL, LDLDIRECT in the last 72 hours. Thyroid function studies No results for input(s): TSH, T4TOTAL, T3FREE, THYROIDAB in the last 72 hours.  Invalid input(s): FREET3 Anemia work up Recent Labs    09/14/19 0442 09/15/19 0400  FERRITIN 115 103   Microbiology Recent Results (from the past 240 hour(s))  Blood Culture (routine x 2)     Status: None (Preliminary result)   Collection Time: 09/11/19  3:23 AM   Specimen: BLOOD  Result Value Ref Range Status   Specimen Description   Final    BLOOD RIGHT ANTECUBITAL Performed at [facility], [address] [place], [place] [address]    Special Requests   Final    BOTTLES DRAWN AEROBIC AND ANAEROBIC Blood Culture results may not be optimal due to an excessive volume of blood received in culture bottles Performed at [facility], [address] [place], [place] [address]    Culture   Final    NO GROWTH 3 DAYS Performed  at [facility], [address], [place], [place] [address]    Report Status PENDING  Incomplete  Blood Culture (routine x 2)     Status: None (Preliminary result)   Collection Time: 09/11/19  4:06 AM   Specimen: BLOOD  Result Value Ref Range Status   Specimen Description   Final    BLOOD LEFT ARM Performed at [facility], [address], [place], [place] [address]    Special Requests   Final    BOTTLES DRAWN AEROBIC AND ANAEROBIC Blood Culture results may not be optimal due to an excessive volume of blood received in culture bottles Performed at [id number], [address] [place], [place] [address]    Culture   Final    NO GROWTH 3 DAYS Performed at [facility], [address] [place], [place] [address]    Report Status PENDING  Incomplete     Discharge Instructions:   Discharge Instructions    Diet - low sodium heart healthy   Complete by: As directed    Discharge instructions   Complete by: As directed    Please remain active but do not overexert.  Use oxygen as prescribed.  Please continue to use oxygen especially during nighttime and while walking.  If your oxygen is above 94% then you can take a break with your oxygen.  Follow-up with your primary care physician in 1 week for regular follow-up including checking your oxygen and blood work.  Do not take hydrochlorothiazide when you take Lasix.  Complete the course of dexamethasone.  Slow resolution is expected.   Increase activity slowly   Complete by: As directed      Allergies as of  09/15/2019      Reactions   Gadolinium Derivatives Swelling, Other (See Comments)   Arm swelling, tingling, & redness. Radiologist Dr. Grace Isaac recommends an injection of steroids, or 13-hour prep if non-emergent, if patient needs a gadolinium based contrast in the future.   Other Rash, Other (See Comments)   Glue or tape to seal up wound   Zoloft [sertraline Hcl] Other (See Comments)   Hair loss      Medication List    TAKE these medications   albuterol 108 (90 Base) MCG/ACT inhaler Commonly known as: VENTOLIN HFA Inhale 2 puffs into the lungs every 4 (four) hours as needed for wheezing or shortness of breath.   ALPRAZolam 0.5 MG tablet Commonly known as: XANAX Take 0.5 mg by mouth 3 (three) times daily as needed for anxiety.   ascorbic acid 500 MG tablet Commonly known as: VITAMIN C Take 1 tablet (500 mg total) by mouth daily.   B-D 3CC LUER-LOK SYR 25GX1" 25G X 1" 3 ML Misc Generic drug: SYRINGE-NEEDLE (DISP) 3 ML USE AS DIRECTED   BEN GAY GREASELESS 10-15 % greaseless cream Apply 1 application topically as needed (for muscle pain).   benzonatate 200 MG capsule Commonly known as: TESSALON Take 200 mg by mouth 3 (three) times daily as needed for cough.   cetirizine 10 MG tablet Commonly known as: ZYRTEC Take 10 mg by mouth daily.   chlorhexidine 0.12 % solution Commonly known as: PERIDEX Use as directed 15 mLs in the mouth or throat 2 (two) times daily as needed (to clean partial).   cyanocobalamin 1000 MCG/ML injection Commonly known as: (VITAMIN B-12) INJECT 1 ML (1,000 MCG TOTAL) INTO THE MUSCLE EVERY 7 DAYS. What changed: See the new instructions.   dexamethasone  6 MG tablet Commonly known as: DECADRON Take 1 tablet (6 mg total) by mouth 2 (two) times daily with a meal.   diazepam 5 MG tablet Commonly known as: Valium Take 1 tablet (5 mg total) by mouth every 8 (eight) hours as needed for muscle spasms (muscle spasm/pain).   famotidine 40 MG tablet Commonly  known as: PEPCID TAKE 1 TABLET BY MOUTH EVERYDAY AT BEDTIME What changed: See the new instructions.   fluconazole 100 MG tablet Commonly known as: DIFLUCAN Take 1 tablet (100 mg total) by mouth daily for 5 days. Start taking on: September 16, 2019   fluticasone 50 MCG/ACT nasal spray Commonly known as: FLONASE Place 2 sprays into both nostrils every evening.   furosemide 20 MG tablet Commonly known as: Lasix Take 2 tablets (40 mg total) by mouth daily for 5 days. Do not take hydrochlorothiazide while taking Lasix   gabapentin 300 MG capsule Commonly known as: NEURONTIN TAKE 1 CAPSULE BY MOUTH THREE TIMES A DAY What changed: See the new instructions.   guaiFENesin 600 MG 12 hr tablet Commonly known as: MUCINEX Take 600 mg by mouth 2 (two) times daily.   guaiFENesin-dextromethorphan 100-10 MG/5ML syrup Commonly known as: ROBITUSSIN DM Take 10 mLs by mouth every 4 (four) hours as needed for cough.   hydrochlorothiazide 25 MG tablet Commonly known as: HYDRODIURIL TAKE 1 TABLET BY MOUTH EVERY DAY   HYDROmorphone 8 MG tablet Commonly known as: DILAUDID Take 1 tablet (8 mg total) by mouth every 6 (six) hours as needed for severe pain. Start taking on: September 18, 2019   Ipratropium-Albuterol 20-100 MCG/ACT Aers respimat Commonly known as: COMBIVENT Inhale 2 puffs into the lungs 3 (three) times daily for 5 days.   mometasone-formoterol 100-5 MCG/ACT Aero Commonly known as: DULERA Inhale 2 puffs into the lungs 2 (two) times daily for 5 days. Use for 5 days then stop.   MULTI-VITAMIN GUMMIES PO Take 2 tablets by mouth daily.   nitrofurantoin (macrocrystal-monohydrate) 100 MG capsule Commonly known as: MACROBID Take 100 mg by mouth daily as needed (for uti prophylaxis, after intercourse).   nystatin 100000 UNIT/ML suspension Commonly known as: MYCOSTATIN Take 5 mLs (500,000 Units total) by mouth 4 (four) times daily.   oxyCODONE 15 MG immediate release tablet Commonly known  as: ROXICODONE Take 1 tablet (15 mg total) by mouth every 4 (four) hours as needed for pain. Start taking on: September 18, 2019   pantoprazole 40 MG tablet Commonly known as: PROTONIX Take 1 tablet (40 mg total) by mouth every morning. Take 30 minutes before breakfast.   promethazine 25 MG tablet Commonly known as: PHENERGAN TAKE 1 TABLET BY MOUTH EVERY 6 HOURS AS NEEDED FOR NAUSEA What changed:   reasons to take this  additional instructions   sucralfate 1 g tablet Commonly known as: Carafate Take 1 tablet (1 g total) by mouth 4 (four) times daily. What changed: when to take this   SUMAtriptan 100 MG tablet Commonly known as: IMITREX Take 1 tablet earliest onset of migraine.  May repeat once in 2 hours if headache persists or recurs. What changed:   how much to take  how to take this  when to take this  reasons to take this  additional instructions   Vitamin D (Ergocalciferol) 1.25 MG (50000 UNIT) Caps capsule Commonly known as: DRISDOL TAKE 1 CAPSULE (50,000 UNITS TOTAL) BY MOUTH EVERY 7 (SEVEN) DAYS.            Durable Medical Equipment  (  From admission, onward)         Start     Ordered   09/15/19 0716  For home use only DME oxygen  Once    Question Answer Comment  Length of Need 6 Months   Mode or (Route) Nasal cannula   Liters per Minute 2   Frequency Continuous (stationary and portable oxygen unit needed)   Oxygen conserving device Yes   Oxygen delivery system Gas      09/15/19 0715         Follow-up Information    [facility] Follow up.   Why: This is [facility] for you home O2. Please call if you have any problems or concerns.  Contact information: [address] [place] [address] [phone]        [name], MD. Schedule an appointment as soon as possible for a visit in 1 week(s).   Specialty: Family Medicine Why: To check your oxygen, chest x-ray and regular blood work Contact information: [address]  [place] [address] [phone]           Time coordinating discharge: 39 minutes  Signed:  Kenyon Eshleman  [facility] 09/15/2019, 2:04 PM

## 2019-09-15 NOTE — Progress Notes (Signed)
Pt provided AVS discharge and oxygen instructions provided to pt and husband. Pt and husband verbalized understanding and had no questions or concerns.

## 2019-09-15 NOTE — TOC Progression Note (Addendum)
Transition of Care [facility]) - Progression Note    Patient Details  Name: [name] MRN: [id number] Date of Birth: [birth year]  Transition of Care [facility]) CM/SW Contact  Geni Bers, RN Phone Number: 09/15/2019, 12:11 PM  Clinical Narrative:     Spoke with pt concerning O2 coming from [facility]. Pt's husband will not be able to pick her up until after 5PM related to him working. Referral for Home O2 was given to in house rep.        Expected Discharge Plan and Services           Expected Discharge Date: (unknown)                                     Social Determinants of Health (SDOH) Interventions    Readmission Risk Interventions No flowsheet data found.

## 2019-09-16 LAB — CULTURE, BLOOD (ROUTINE X 2)
Culture: NO GROWTH
Culture: NO GROWTH

## 2019-09-19 ENCOUNTER — Telehealth: Payer: Self-pay | Admitting: Family Medicine

## 2019-09-19 NOTE — Telephone Encounter (Signed)
Left message for patient to call back  

## 2019-09-19 NOTE — Telephone Encounter (Signed)
Take all the Dexamethasone (5 tablets) as directed, and then she will not need anymore. Keep taking the Lasix until she sees me

## 2019-09-19 NOTE — Telephone Encounter (Signed)
Pt was in the hospital for Pneumonia and has scheduled a hospital follow up for 4/27 at 3:00pm. Pt states they prescribed her mediations- dexamethasone and furosemide- to take but only gave her 5 days worth. She is not sure if she is suppose to continue these medications and she needs clarity on the instructions for dexamethasone?   Pharmacy: [facility] [name] Fax: [phone]   Pt can be reached at [phone]   Pt is needing to know if she should continue these today since Dr. Clent Ridges will be on vacation the rest of the week

## 2019-09-20 ENCOUNTER — Encounter: Payer: Self-pay | Admitting: Family Medicine

## 2019-09-20 ENCOUNTER — Ambulatory Visit: Payer: BC Managed Care – PPO | Admitting: Licensed Clinical Social Worker

## 2019-09-20 MED ORDER — NYSTATIN 100000 UNIT/ML MT SUSP: 5.0000 mL | Freq: Four times a day (QID) | OROMUCOSAL | 0 refills | Status: DC

## 2019-09-20 NOTE — Telephone Encounter (Signed)
Please advise. Dr. Fry is out of the office 

## 2019-09-21 DIAGNOSIS — U071 COVID-19: Secondary | ICD-10-CM | POA: Diagnosis not present

## 2019-09-25 ENCOUNTER — Other Ambulatory Visit: Payer: Self-pay

## 2019-09-26 ENCOUNTER — Ambulatory Visit (INDEPENDENT_AMBULATORY_CARE_PROVIDER_SITE_OTHER): Payer: BC Managed Care – PPO | Admitting: Family Medicine

## 2019-09-26 ENCOUNTER — Encounter: Payer: Self-pay | Admitting: Family Medicine

## 2019-09-26 VITALS — BP 130/64 | HR 131 | Temp 97.1°F | Wt 282.4 lb

## 2019-09-26 DIAGNOSIS — L309 Dermatitis, unspecified: Secondary | ICD-10-CM

## 2019-09-26 DIAGNOSIS — U071 COVID-19: Secondary | ICD-10-CM

## 2019-09-26 DIAGNOSIS — E669 Obesity, unspecified: Secondary | ICD-10-CM

## 2019-09-26 DIAGNOSIS — J1282 Pneumonia due to coronavirus disease 2019: Secondary | ICD-10-CM | POA: Diagnosis not present

## 2019-09-26 MED ORDER — FUROSEMIDE 20 MG PO TABS: 40.0000 mg | ORAL_TABLET | Freq: Every day | ORAL | 2 refills | Status: DC

## 2019-09-26 MED ORDER — ALBUTEROL SULFATE HFA 108 (90 BASE) MCG/ACT IN AERS: 2.0000 | INHALATION_SPRAY | RESPIRATORY_TRACT | 11 refills | Status: DC | PRN

## 2019-09-26 MED ORDER — TRIAMCINOLONE ACETONIDE 0.1 % EX CREA: 1.0000 "application " | TOPICAL_CREAM | Freq: Two times a day (BID) | CUTANEOUS | 2 refills | Status: DC

## 2019-09-26 NOTE — Progress Notes (Signed)
   Subjective:    Patient ID: [name], female    DOB: [birth year], 44 y.o.   MRN: [id number]  HPI Here to follow up on a hospital stay from 09-11-19 to 09-15-19 for bilateral Covid pneumonia. She tested positive for the Covid-19 virus on 08-24-19, but she worsened after a few days. She then presented with SOB, chest pains, a dry cough, and oxygen desaturations to the low 80's. A CTA of the chest was negative for PE's but showed bilateral opacities. She was treated with IV steroids and IV Lasix. She was also given albuterol inhalers, Dulera, and Combivent. She was sent home on [place] oxygen which she wears continuously. This is currently set on 2 liters. She maintains sats in the mid 90's at rest, but simply walking to her bathroom causes these to drop to the mid 80's. She feels mildly SOB at rest, but she has no coughing and no chest pain. No fevers. Her appetite is poor. She is drinking fluids. She also asks me to check a red itchy rash that has appeared on both hands in the past few weeks. She was sent on on 5 days of Prednisone, and this was finished 2 days ago.    Review of Systems  Constitutional: Positive for fatigue. Negative for chills, diaphoresis and fever.  Respiratory: Positive for chest tightness and shortness of breath. Negative for cough and wheezing.   Cardiovascular: Negative.   Gastrointestinal: Negative.   Genitourinary: Negative.   Skin: Positive for rash.  Neurological: Negative.        Objective:   Physical Exam Constitutional:      Appearance: She is obese.     Comments: Wearing [place] oxygen   Cardiovascular:     Rate and Rhythm: Normal rate and regular rhythm.     Pulses: Normal pulses.     Heart sounds: Normal heart sounds.  Pulmonary:     Effort: Pulmonary effort is normal. No respiratory distress.     Breath sounds: Normal breath sounds. No stridor. No wheezing, rhonchi or rales.  Musculoskeletal:     Comments: 1+ edema in both ankles   Skin:    Comments: The  dorsum of both hands is red and scaly   Neurological:     Mental Status: She is alert.           Assessment & Plan:  She is recovering from a Covid pneumonia, and she is still oxygen dependent at this point. She will continue to wear continuous oxygen, but I did ask her to start challenging her self a bit by walking around inside her house. She needs to increase her stamina and we hope to wean off oxygen soon. She will continue Dulera and the albuterol inhalers. She will stay on Lasix for now. She has eczema on her hands, and we will treat this with Triamcinolone cream.  Gershon Crane, MD

## 2019-09-27 ENCOUNTER — Telehealth: Payer: Self-pay | Admitting: Family Medicine

## 2019-09-27 DIAGNOSIS — L309 Dermatitis, unspecified: Secondary | ICD-10-CM | POA: Insufficient documentation

## 2019-09-27 NOTE — Telephone Encounter (Addendum)
Pt called again to check to see if this has been completed. She is needing this sent ASAP or they will cancel her appt for today. This is the only day she can come due to dentist being on vacation after today.

## 2019-09-27 NOTE — Telephone Encounter (Signed)
Fax number was confirmed. This was faxed this morning and has been refaxed.

## 2019-09-27 NOTE — Telephone Encounter (Signed)
Pt had a visit with Dr. Clent Ridges yesterday, she was needing clearance from him for dentist appt today. She mentioned Dr Clent Ridges said he would have it to the dentist office first thing this morning. Dentist office informed that have not received it as of yet. Pt appt is at 2:00pm but they are holding the appt for her. They can not continue to hold it with out her medical clearance, she would like to have it faxed to them as soon as possible.   Dr. Tonye Becket, DDS  [phone]

## 2019-10-04 ENCOUNTER — Ambulatory Visit: Payer: BC Managed Care – PPO | Admitting: Licensed Clinical Social Worker

## 2019-10-09 ENCOUNTER — Other Ambulatory Visit: Payer: Self-pay

## 2019-10-10 ENCOUNTER — Telehealth: Payer: Self-pay | Admitting: Family Medicine

## 2019-10-10 ENCOUNTER — Encounter: Payer: Self-pay | Admitting: Family Medicine

## 2019-10-10 ENCOUNTER — Ambulatory Visit (INDEPENDENT_AMBULATORY_CARE_PROVIDER_SITE_OTHER): Payer: BC Managed Care – PPO | Admitting: Family Medicine

## 2019-10-10 VITALS — BP 140/80 | HR 138 | Temp 97.2°F | Wt 278.8 lb

## 2019-10-10 DIAGNOSIS — J1282 Pneumonia due to coronavirus disease 2019: Secondary | ICD-10-CM | POA: Diagnosis not present

## 2019-10-10 DIAGNOSIS — U071 COVID-19: Secondary | ICD-10-CM | POA: Diagnosis not present

## 2019-10-10 NOTE — Progress Notes (Signed)
   Subjective:    Patient ID: [name], female    DOB: 10/13/1975, 44 y.o.   MRN: [id number]  HPI Here to follow up on Covid pneumonia. She was hospitalized last month for this and she received high dose steroids for while. She has not been off Prednisone about 2 weeks. She is using Dulera and albuterol inhalers. She has been oxygen dependent, and currently she is using 2.5 liters of flow. She has no cough or chest pain, but she remains quite SOB. Even when wearing oxygen she has difficulty walking from one end of her house to the other. Her BP is stable but her resting heart rates stay in the range of 100-120. She did rule out for PE's last month with normal D dimers and a normal chest CT angiogram. No ankle swelling.    Review of Systems  Constitutional: Positive for fatigue. Negative for chills, diaphoresis and fever.  Respiratory: Positive for shortness of breath. Negative for cough and wheezing.   Cardiovascular: Negative.   Gastrointestinal: Negative.   Genitourinary: Negative.        Objective:   Physical Exam Constitutional:      Comments: Wearing [place] oxygen. She walks very slowly.   Cardiovascular:     Rate and Rhythm: Regular rhythm. Tachycardia present.     Pulses: Normal pulses.     Heart sounds: Normal heart sounds. No murmur.  Pulmonary:     Effort: Pulmonary effort is normal. No respiratory distress.     Breath sounds: Normal breath sounds. No stridor. No wheezing, rhonchi or rales.  Lymphadenopathy:     Cervical: No cervical adenopathy.  Neurological:     Mental Status: She is alert and oriented to person, place, and time. Mental status is at baseline.           Assessment & Plan:  She is recovering from a Covid pneumonia but she remains quite SOB and oxygen dependent. We will refer her to Pulmonary to see if anything else can be done.  Gershon Crane, MD

## 2019-10-10 NOTE — Telephone Encounter (Signed)
Pt stated the top of both feet it hurts like a sunburn but is not sunburnt, not swollen, no redness-she is wondering if it has to due with the low oxygen or something different?  Pt can be reached at [phone]

## 2019-10-11 NOTE — Telephone Encounter (Signed)
Patient is aware. Nothing further needed.  

## 2019-10-11 NOTE — Telephone Encounter (Signed)
Yes it could be related to the hypoxia. Let's watch this and she can let me know it things change

## 2019-10-12 DIAGNOSIS — M2550 Pain in unspecified joint: Secondary | ICD-10-CM | POA: Diagnosis not present

## 2019-10-12 DIAGNOSIS — M150 Primary generalized (osteo)arthritis: Secondary | ICD-10-CM | POA: Diagnosis not present

## 2019-10-12 DIAGNOSIS — M797 Fibromyalgia: Secondary | ICD-10-CM | POA: Diagnosis not present

## 2019-10-12 DIAGNOSIS — G629 Polyneuropathy, unspecified: Secondary | ICD-10-CM | POA: Diagnosis not present

## 2019-10-18 ENCOUNTER — Encounter: Payer: Self-pay | Admitting: Family Medicine

## 2019-10-18 ENCOUNTER — Ambulatory Visit: Payer: BC Managed Care – PPO | Admitting: Licensed Clinical Social Worker

## 2019-10-18 ENCOUNTER — Telehealth (INDEPENDENT_AMBULATORY_CARE_PROVIDER_SITE_OTHER): Payer: BC Managed Care – PPO | Admitting: Family Medicine

## 2019-10-18 DIAGNOSIS — F1190 Opioid use, unspecified, uncomplicated: Secondary | ICD-10-CM | POA: Diagnosis not present

## 2019-10-18 DIAGNOSIS — M5136 Other intervertebral disc degeneration, lumbar region: Secondary | ICD-10-CM | POA: Diagnosis not present

## 2019-10-18 NOTE — Progress Notes (Signed)
Subjective:    Patient ID: [name], female    DOB: Aug 17, 1975, 44 y.o.   MRN: [id number]  HPI Virtual Visit via Video Note  I connected with the patient on 10/18/19 at  2:30 PM EDT by a video enabled telemedicine application and verified that I am speaking with the correct person using two identifiers.  Location patient: home Location provider:work or home office Persons participating in the virtual visit: patient, provider  I discussed the limitations of evaluation and management by telemedicine and the availability of in person appointments. The patient expressed understanding and agreed to proceed.   HPI: Here for pain management. She is doing well with pain, although she is struggling with a Covid pneumonia currently.  Indication for chronic opioid: low back pain Medication and dose: Oxycodone 15 mg and Dilaudid 8 mg # pills per month: 180 and 30 Last UDS date: 04-10-19 Opioid Treatment Agreement signed (Y/N): 04-17-19 Opioid Treatment Agreement last reviewed with patient:  10-18-19 [facility] reviewed this encounter (include red flags): Yes    ROS: See pertinent positives and negatives per HPI.  Past Medical History:  Diagnosis Date  . Allergy   . Anxiety   . Arthritis   . B12 deficiency   . Chronic narcotic use   . Depression    not currently   . Dysrhythmia    hx tachy-was from medication nortriptylline  . Esophagitis    rx  . Fibromyalgia   . GERD (gastroesophageal reflux disease)   . History of echocardiogram    Echo 4/18: Mild concentric LVH, vigorous LVF, EF 65-70, normal wall motion, grade 1 diastolic dysfunction  . History of kidney stones   . Hyperlipidemia   . Intervertebral disc protrusion 01/11/2014  . Migraine   . Obesity (BMI 35.0-39.9 without comorbidity)   . Peripheral neuropathy   . Polycystic ovaries   . Pulmonary embolus (HCC)   . Spinal stenosis, lumbar   . Vitamin D deficiency     Past Surgical History:  Procedure Laterality Date   . 24 HOUR PH STUDY N/A 05/27/2016   Procedure: 24 HOUR PH STUDY;  Surgeon: Ruffin Frederick, MD;  Location: [facility];  Service: Gastroenterology;  Laterality: N/A;  . ABDOMINAL EXPOSURE N/A 05/04/2018   Procedure: ABDOMINAL EXPOSURE;  Surgeon: Larina Earthly, MD;  Location: [facility] OR;  Service: Vascular;  Laterality: N/A;  . ANTERIOR LUMBAR FUSION N/A 05/04/2018   Procedure: Lumbar Five Sacral One Anterior lumbar interbody fusion;  Surgeon: Donalee Citrin, MD;  Location: [facility] OR;  Service: Neurosurgery;  Laterality: N/A;  Lumbar Five Sacral One Anterior lumbar interbody fusion  . APPENDECTOMY    . BACK SURGERY  2012   left laminectomy at L3-4 per Dr. Phoebe Perch   . bladder tack  2012  . CHOLECYSTECTOMY N/A 10/03/2015   Procedure: LAPAROSCOPIC CHOLECYSTECTOMY;  Surgeon: Rodman Pickle, MD;  Location: [facility] OR;  Service: General;  Laterality: N/A;  . ESOPHAGEAL MANOMETRY N/A 05/27/2016   Procedure: ESOPHAGEAL MANOMETRY (EM);  Surgeon: Ruffin Frederick, MD;  Location: [facility];  Service: Gastroenterology;  Laterality: N/A;  . EXCISION OF SKIN TAG  10/03/2015   Procedure: EXCISION OF SKIN TAG;  Surgeon: De Blanch Kinsinger, MD;  Location: [facility] OR;  Service: General;;  . LUMBAR DISC SURGERY  01-31-14   per Dr. [name]   . OOPHORECTOMY     left overy  . OVARIAN CYST REMOVAL    . RECTOCELE REPAIR    . SINUSOTOMY  12/14/06  .  spinal fusion  2016/10  . TONSILLECTOMY    . TONSILLECTOMY    . VAGINAL HYSTERECTOMY    . WRIST GANGLION EXCISION Left     Family History  Problem Relation Age of Onset  . Fibromyalgia Mother   . Diabetes Mother   . Thyroid cancer Mother   . Liver disease Mother   . Neuropathy Mother   . Cirrhosis Mother        non-alcoholic  . Pulmonary embolism Mother        both lungs  . Heart failure Mother   . Hypertension Father   . Diabetes Father   . Heart disease Father   . Prostate cancer Father   . Heart attack Father        x 2  . Colon cancer Neg Hx   . Other  Neg Hx        pheochromocytoma  . Colon polyps Neg Hx      Current Outpatient Medications:  .  albuterol (VENTOLIN HFA) 108 (90 Base) MCG/ACT inhaler, Inhale 2 puffs into the lungs every 4 (four) hours as needed for wheezing or shortness of breath., Disp: 18 g, Rfl: 11 .  ALPRAZolam (XANAX) 0.5 MG tablet, Take 0.5 mg by mouth 3 (three) times daily as needed for anxiety. , Disp: , Rfl:  .  ascorbic acid (VITAMIN C) 500 MG tablet, Take 1 tablet (500 mg total) by mouth daily., Disp:  , Rfl:  .  benzonatate (TESSALON) 200 MG capsule, Take 200 mg by mouth 3 (three) times daily as needed for cough., Disp: , Rfl:  .  cetirizine (ZYRTEC) 10 MG tablet, Take 10 mg by mouth daily., Disp: , Rfl:  .  chlorhexidine (PERIDEX) 0.12 % solution, Use as directed 15 mLs in the mouth or throat 2 (two) times daily as needed (to clean partial). , Disp: , Rfl:  .  cyanocobalamin (,VITAMIN B-12,) 1000 MCG/ML injection, INJECT 1 ML (1,000 MCG TOTAL) INTO THE MUSCLE EVERY 7 DAYS. (Patient taking differently: Inject 1,000 mcg into the muscle once a week. ), Disp: 4 mL, Rfl: 8 .  dexamethasone (DECADRON) 6 MG tablet, Take 1 tablet (6 mg total) by mouth 2 (two) times daily with a meal., Disp: 5 tablet, Rfl: 0 .  diazepam (VALIUM) 5 MG tablet, Take 1 tablet (5 mg total) by mouth every 8 (eight) hours as needed for muscle spasms (muscle spasm/pain)., Disp: 90 tablet, Rfl: 5 .  famotidine (PEPCID) 40 MG tablet, TAKE 1 TABLET BY MOUTH EVERYDAY AT BEDTIME, Disp: 30 tablet, Rfl: 5 .  fluticasone (FLONASE) 50 MCG/ACT nasal spray, Place 2 sprays into both nostrils every evening. , Disp: , Rfl:  .  furosemide (LASIX) 20 MG tablet, Take 2 tablets (40 mg total) by mouth daily for 5 days. Do not take hydrochlorothiazide while taking Lasix, Disp: 30 tablet, Rfl: 2 .  gabapentin (NEURONTIN) 300 MG capsule, TAKE 1 CAPSULE BY MOUTH THREE TIMES A DAY (Patient taking differently: Take 300 mg by mouth 3 (three) times daily. ), Disp: 270 capsule,  Rfl: 2 .  guaiFENesin (MUCINEX) 600 MG 12 hr tablet, Take 600 mg by mouth 2 (two) times daily., Disp: , Rfl:  .  guaiFENesin-dextromethorphan (ROBITUSSIN DM) 100-10 MG/5ML syrup, Take 10 mLs by mouth every 4 (four) hours as needed for cough., Disp: 118 mL, Rfl: 0 .  hydrochlorothiazide (HYDRODIURIL) 25 MG tablet, TAKE 1 TABLET BY MOUTH EVERY DAY (Patient taking differently: Take 25 mg by mouth daily. ), Disp: 30 tablet,  Rfl: 5 .  HYDROmorphone (DILAUDID) 8 MG tablet, Take 1 tablet (8 mg total) by mouth every 6 (six) hours as needed for severe pain., Disp: 30 tablet, Rfl: 0 .  Menthol-Methyl Salicylate (BEN GAY GREASELESS) 10-15 % greaseless cream, Apply 1 application topically as needed (for muscle pain)., Disp: 30 g, Rfl: 0 .  mometasone-formoterol (DULERA) 100-5 MCG/ACT AERO, Inhale 2 puffs into the lungs 2 (two) times daily for 5 days. Use for 5 days then stop., Disp: 8.8 g, Rfl: 0 .  Multiple Vitamins-Minerals (MULTI-VITAMIN GUMMIES PO), Take 2 tablets by mouth daily. , Disp: , Rfl:  .  nitrofurantoin, macrocrystal-monohydrate, (MACROBID) 100 MG capsule, Take 100 mg by mouth daily as needed (for uti prophylaxis, after intercourse). , Disp: , Rfl: 2 .  nystatin (MYCOSTATIN) 100000 UNIT/ML suspension, Take 5 mLs (500,000 Units total) by mouth 4 (four) times daily., Disp: 60 mL, Rfl: 0 .  oxyCODONE (ROXICODONE) 15 MG immediate release tablet, Take 1 tablet (15 mg total) by mouth every 4 (four) hours as needed for pain., Disp: 180 tablet, Rfl: 0 .  pantoprazole (PROTONIX) 40 MG tablet, Take 1 tablet (40 mg total) by mouth every morning. Take 30 minutes before breakfast., Disp: 90 tablet, Rfl: 3 .  promethazine (PHENERGAN) 25 MG tablet, TAKE 1 TABLET BY MOUTH EVERY 6 HOURS AS NEEDED FOR NAUSEA (Patient taking differently: Take 25 mg by mouth every 6 (six) hours as needed for nausea or vomiting. ), Disp: 90 tablet, Rfl: 1 .  sucralfate (CARAFATE) 1 g tablet, Take 1 tablet (1 g total) by mouth 4 (four)  times daily. (Patient taking differently: Take 1 g by mouth daily. ), Disp: 120 tablet, Rfl: 2 .  SUMAtriptan (IMITREX) 100 MG tablet, Take 1 tablet earliest onset of migraine.  May repeat once in 2 hours if headache persists or recurs. (Patient taking differently: Take 100 mg by mouth every 2 (two) hours as needed for migraine. ), Disp: 10 tablet, Rfl: 2 .  SYRINGE-NEEDLE, DISP, 3 ML (B-D 3CC LUER-LOK SYR 25GX1") 25G X 1" 3 ML MISC, USE AS DIRECTED, Disp: 9 each, Rfl: 11 .  triamcinolone cream (KENALOG) 0.1 %, Apply 1 application topically 2 (two) times daily., Disp: 45 g, Rfl: 2 .  Vitamin D, Ergocalciferol, (DRISDOL) 1.25 MG (50000 UT) CAPS capsule, TAKE 1 CAPSULE (50,000 UNITS TOTAL) BY MOUTH EVERY 7 (SEVEN) DAYS., Disp: 12 capsule, Rfl: 3  EXAM:  VITALS per patient if applicable:  GENERAL: alert, oriented, appears well and in no acute distress  HEENT: atraumatic, conjunttiva clear, no obvious abnormalities on inspection of external nose and ears  NECK: normal movements of the head and neck  LUNGS: on inspection no signs of respiratory distress, breathing rate appears normal, no obvious gross SOB, gasping or wheezing  CV: no obvious cyanosis  MS: moves all visible extremities without noticeable abnormality  PSYCH/NEURO: pleasant and cooperative, no obvious depression or anxiety, speech and thought processing grossly intact  ASSESSMENT AND PLAN: Pain management, meds were refilled.  Alysia Penna, MD  Discussed the following assessment and plan:  No diagnosis found.     I discussed the assessment and treatment plan with the patient. The patient was provided an opportunity to ask questions and all were answered. The patient agreed with the plan and demonstrated an understanding of the instructions.   The patient was advised to call back or seek an in-person evaluation if the symptoms worsen or if the condition fails to improve as anticipated.  Review of Systems       Objective:   Physical Exam        Assessment & Plan:

## 2019-10-19 MED ORDER — HYDROMORPHONE HCL 8 MG PO TABS: 8.0000 mg | ORAL_TABLET | Freq: Four times a day (QID) | ORAL | 0 refills | Status: DC | PRN

## 2019-10-19 MED ORDER — HYDROMORPHONE HCL 8 MG PO TABS: 8.0000 mg | ORAL_TABLET | Freq: Four times a day (QID) | ORAL | 0 refills | Status: AC | PRN

## 2019-10-19 MED ORDER — OXYCODONE HCL 15 MG PO TABS: 15.0000 mg | ORAL_TABLET | ORAL | 0 refills | Status: DC | PRN

## 2019-10-19 MED ORDER — OXYCODONE HCL 15 MG PO TABS: 15.0000 mg | ORAL_TABLET | ORAL | 0 refills | Status: AC | PRN

## 2019-10-19 NOTE — Telephone Encounter (Signed)
I just sent them in

## 2019-10-19 NOTE — Telephone Encounter (Signed)
Please advise. I do not see that any medications were sent in.

## 2019-10-21 DIAGNOSIS — U071 COVID-19: Secondary | ICD-10-CM | POA: Diagnosis not present

## 2019-11-01 ENCOUNTER — Ambulatory Visit: Payer: BC Managed Care – PPO | Admitting: Licensed Clinical Social Worker

## 2019-11-08 ENCOUNTER — Other Ambulatory Visit: Payer: Self-pay | Admitting: Family Medicine

## 2019-11-09 ENCOUNTER — Other Ambulatory Visit: Payer: Self-pay

## 2019-11-09 ENCOUNTER — Ambulatory Visit: Payer: BC Managed Care – PPO | Admitting: Emergency Medicine

## 2019-11-09 ENCOUNTER — Encounter: Payer: Self-pay | Admitting: Emergency Medicine

## 2019-11-09 DIAGNOSIS — J849 Interstitial pulmonary disease, unspecified: Secondary | ICD-10-CM

## 2019-11-09 DIAGNOSIS — U071 COVID-19: Secondary | ICD-10-CM | POA: Diagnosis not present

## 2019-11-09 DIAGNOSIS — J1282 Pneumonia due to coronavirus disease 2019: Secondary | ICD-10-CM | POA: Diagnosis not present

## 2019-11-09 MED ORDER — BREZTRI AEROSPHERE 160-9-4.8 MCG/ACT IN AERO: 2.0000 | INHALATION_SPRAY | Freq: Two times a day (BID) | RESPIRATORY_TRACT | 0 refills | Status: DC

## 2019-11-09 NOTE — Addendum Note (Signed)
Addended by: Dorisann Frames R on: 11/09/2019 05:30 PM   Modules accepted: Orders

## 2019-11-09 NOTE — Patient Instructions (Signed)
Walking oximetry on room air today. Stop Dulera Keep albuterol available to use 2 puffs if you need it for shortness of breath, chest tightness, wheezing. We will arrange for full pulmonary function testing in next office visit We will arrange for a high-resolution CT scan of the chest to evaluate for interstitial lung disease Follow with Dr. Delton Coombes next available with full pulmonary function testing on the same day.

## 2019-11-09 NOTE — Progress Notes (Signed)
Subjective:    Patient ID: [name], female    DOB: 02/03/1976, 44 y.o.   MRN: [id number]  HPI 44 year old obese woman, former smoker (4 pack-years), with a history of fibromyalgia, PCOD, depression, chronic back pain with chronic narcotic use, migraines, pulmonary embolism 06/2018 treated with xarelto x 6 months.  She was admitted on 3/29, received steroids and remdesivir, discharged on RA but then re-admitted 09/15/2019 for progressive dyspnea, cough, fatigue after being diagnosed with COVID-19 08/24/2019.  She had been treated as an outpatient with corticosteroids but had progressive symptoms.  She received corticosteroids, empiric diuresis, oxygen. She was sent home on BD's - isn't using reliably.   CT chest done on 08/28/2019 reviewed, shows no pulmonary embolism, no adenopathy, but diffuse bilateral groundglass infiltrates without effusions.  A repeat CT chest was done 09/11/2019 when she was admitted.  On my review no PE, no lymphadenopathy, some mild improvement in her groundglass infiltrates with persistent patchy changes and some evolving streakiness.   Review of Systems As per HPI  Past Medical History:  Diagnosis Date  . Allergy   . Anxiety   . Arthritis   . B12 deficiency   . Chronic narcotic use   . Depression    not currently   . Dysrhythmia    hx tachy-was from medication nortriptylline  . Esophagitis    rx  . Fibromyalgia   . GERD (gastroesophageal reflux disease)   . History of echocardiogram    Echo 4/18: Mild concentric LVH, vigorous LVF, EF 65-70, normal wall motion, grade 1 diastolic dysfunction  . History of kidney stones   . Hyperlipidemia   . Intervertebral disc protrusion 01/11/2014  . Migraine   . Obesity (BMI 35.0-39.9 without comorbidity)   . Peripheral neuropathy   . Polycystic ovaries   . Pulmonary embolus ([place])   . Spinal stenosis, lumbar   . Vitamin D deficiency      Family History  Problem Relation Age of Onset  . Fibromyalgia Mother     . Diabetes Mother   . Thyroid cancer Mother   . Liver disease Mother   . Neuropathy Mother   . Cirrhosis Mother        non-alcoholic  . Pulmonary embolism Mother        both lungs  . Heart failure Mother   . Hypertension Father   . Diabetes Father   . Heart disease Father   . Prostate cancer Father   . Heart attack Father        x 2  . Colon cancer Neg Hx   . Other Neg Hx        pheochromocytoma  . Colon polyps Neg Hx      Social History   Socioeconomic History  . Marital status: Married    Spouse name: Not on file  . Number of children: 2  . Years of education: Not on file  . Highest education level: Not on file  Occupational History  . Occupation: housewife  Tobacco Use  . Smoking status: Former Smoker    Types: Cigarettes    Quit date: 1999    Years since quitting: 22.4  . Smokeless tobacco: Never Used  Vaping Use  . Vaping Use: Never used  Substance and Sexual Activity  . Alcohol use: No    Alcohol/week: 0.0 standard drinks  . Drug use: No  . Sexual activity: Yes    Birth control/protection: Surgical  Other Topics Concern  . Not on file  Social History Narrative  . Not on file   Social Determinants of Health   Financial Resource Strain:   . Difficulty of Paying Living Expenses:   Food Insecurity:   . Worried About Programme researcher, broadcasting/film/video in the Last Year:   . Barista in the Last Year:   Transportation Needs:   . Freight forwarder (Medical):   Marland Kitchen Lack of Transportation (Non-Medical):   Physical Activity:   . Days of Exercise per Week:   . Minutes of Exercise per Session:   Stress:   . Feeling of Stress :   Social Connections:   . Frequency of Communication with Friends and Family:   . Frequency of Social Gatherings with Friends and Family:   . Attends Religious Services:   . Active Member of Clubs or Organizations:   . Attends Banker Meetings:   Marland Kitchen Marital Status:   Intimate Partner Violence:   . Fear of Current or  Ex-Partner:   . Emotionally Abused:   Marland Kitchen Physically Abused:   . Sexually Abused:      Allergies  Allergen Reactions  . Gadolinium Derivatives Swelling and Other (See Comments)    Arm swelling, tingling, & redness. Radiologist Dr. Grace Isaac recommends an injection of steroids, or 13-hour prep if non-emergent, if patient needs a gadolinium based contrast in the future.  . Other Rash and Other (See Comments)    Glue or tape to seal up wound  . Zoloft [Sertraline Hcl] Other (See Comments)    Hair loss     Outpatient Medications Prior to Visit  Medication Sig Dispense Refill  . albuterol (VENTOLIN HFA) 108 (90 Base) MCG/ACT inhaler Inhale 2 puffs into the lungs every 4 (four) hours as needed for wheezing or shortness of breath. 18 g 11  . ALPRAZolam (XANAX) 0.5 MG tablet Take 0.5 mg by mouth 3 (three) times daily as needed for anxiety.     Marland Kitchen ascorbic acid (VITAMIN C) 500 MG tablet Take 1 tablet (500 mg total) by mouth daily.    . cetirizine (ZYRTEC) 10 MG tablet Take 10 mg by mouth daily.    . chlorhexidine (PERIDEX) 0.12 % solution Use as directed 15 mLs in the mouth or throat 2 (two) times daily as needed (to clean partial).     . cyanocobalamin (,VITAMIN B-12,) 1000 MCG/ML injection INJECT 1 ML (1,000 MCG TOTAL) INTO THE MUSCLE EVERY 7 DAYS. (Patient taking differently: Inject 1,000 mcg into the muscle once a week. ) 4 mL 8  . diazepam (VALIUM) 5 MG tablet Take 1 tablet (5 mg total) by mouth every 8 (eight) hours as needed for muscle spasms (muscle spasm/pain). 90 tablet 5  . famotidine (PEPCID) 40 MG tablet TAKE 1 TABLET BY MOUTH EVERYDAY AT BEDTIME 30 tablet 5  . fluticasone (FLONASE) 50 MCG/ACT nasal spray Place 2 sprays into both nostrils every evening.     . gabapentin (NEURONTIN) 300 MG capsule TAKE 1 CAPSULE BY MOUTH THREE TIMES A DAY (Patient taking differently: Take 300 mg by mouth 3 (three) times daily. ) 270 capsule 2  . guaiFENesin (MUCINEX) 600 MG 12 hr tablet Take 600 mg by mouth 2  (two) times daily.    Marland Kitchen guaiFENesin-dextromethorphan (ROBITUSSIN DM) 100-10 MG/5ML syrup Take 10 mLs by mouth every 4 (four) hours as needed for cough. 118 mL 0  . hydrochlorothiazide (HYDRODIURIL) 25 MG tablet TAKE 1 TABLET BY MOUTH EVERY DAY (Patient taking differently: Take 25 mg by mouth daily. )  30 tablet 5  . mometasone-formoterol (DULERA) 100-5 MCG/ACT AERO Inhale 2 puffs into the lungs 2 (two) times daily for 5 days. Use for 5 days then stop. 8.8 g 0  . Multiple Vitamins-Minerals (MULTI-VITAMIN GUMMIES PO) Take 2 tablets by mouth daily.     . nitrofurantoin, macrocrystal-monohydrate, (MACROBID) 100 MG capsule Take 100 mg by mouth daily as needed (for uti prophylaxis, after intercourse).   2  . nystatin (MYCOSTATIN) 100000 UNIT/ML suspension Take 5 mLs (500,000 Units total) by mouth 4 (four) times daily. 60 mL 0  . [START ON 12/19/2019] oxyCODONE (ROXICODONE) 15 MG immediate release tablet Take 1 tablet (15 mg total) by mouth every 4 (four) hours as needed for pain. 180 tablet 0  . pantoprazole (PROTONIX) 40 MG tablet Take 1 tablet (40 mg total) by mouth every morning. Take 30 minutes before breakfast. 90 tablet 3  . promethazine (PHENERGAN) 25 MG tablet TAKE 1 TABLET BY MOUTH EVERY 6 HOURS AS NEEDED FOR NAUSEA (Patient taking differently: Take 25 mg by mouth every 6 (six) hours as needed for nausea or vomiting. ) 90 tablet 1  . sucralfate (CARAFATE) 1 g tablet Take 1 tablet (1 g total) by mouth 4 (four) times daily. (Patient taking differently: Take 1 g by mouth daily. ) 120 tablet 2  . SUMAtriptan (IMITREX) 100 MG tablet Take 1 tablet earliest onset of migraine.  May repeat once in 2 hours if headache persists or recurs. (Patient taking differently: Take 100 mg by mouth every 2 (two) hours as needed for migraine. ) 10 tablet 2  . SYRINGE-NEEDLE, DISP, 3 ML (B-D 3CC LUER-LOK SYR 25GX1") 25G X 1" 3 ML MISC USE AS DIRECTED 9 each 11  . triamcinolone cream (KENALOG) 0.1 % Apply 1 application  topically 2 (two) times daily. 45 g 2  . Vitamin D, Ergocalciferol, (DRISDOL) 1.25 MG (50000 UNIT) CAPS capsule TAKE 1 CAPSULE (50,000 UNITS TOTAL) BY MOUTH EVERY 7 (SEVEN) DAYS. 12 capsule 3  . benzonatate (TESSALON) 200 MG capsule Take 200 mg by mouth 3 (three) times daily as needed for cough. (Patient not taking: Reported on 11/09/2019)    . dexamethasone (DECADRON) 6 MG tablet Take 1 tablet (6 mg total) by mouth 2 (two) times daily with a meal. 5 tablet 0  . furosemide (LASIX) 20 MG tablet Take 2 tablets (40 mg total) by mouth daily for 5 days. Do not take hydrochlorothiazide while taking Lasix 30 tablet 2  . [START ON 12/19/2019] HYDROmorphone (DILAUDID) 8 MG tablet Take 1 tablet (8 mg total) by mouth every 6 (six) hours as needed for severe pain. (Patient not taking: Reported on 11/09/2019) 30 tablet 0  . Menthol-Methyl Salicylate (BEN GAY GREASELESS) 10-15 % greaseless cream Apply 1 application topically as needed (for muscle pain). (Patient not taking: Reported on 11/09/2019) 30 g 0   No facility-administered medications prior to visit.        Objective:   Physical Exam Vitals:   11/09/19 1620  BP: (!) 148/78  Pulse: (!) 128  Temp: 97.8 F (36.6 C)  TempSrc: Oral  SpO2: 97%  Weight: 284 lb 3.2 oz (128.9 kg)  Height: 5' 9.5" (1.765 m)   Gen: Pleasant, obese woman, in no distress,  normal affect  ENT: No lesions,  mouth clear,  oropharynx clear, no postnasal drip  Neck: No JVD, no stridor  Lungs: No use of accessory muscles, small breaths, no crackles or wheezing on normal respiration, no wheeze on forced expiration  Cardiovascular: RRR, heart  sounds normal, no murmur or gallops, no peripheral edema  Musculoskeletal: No deformities, no cyanosis or clubbing  Neuro: alert, awake, non focal  Skin: Warm, no lesions or rash       Assessment & Plan:  Pneumonia due to COVID-19 virus Prolonged course with associated hypoxemia following COVID-19 pneumonia.  She has been on  oxygen 2 L/min since her most recent discharge.  Other factors contributing to her hypoxemia likely include obesity/OHS, her narcotics requirement.  There was no evidence of recurrent PE on any of her recent studies.  Explained to her that the typical course is 1 of improvement hopefully back to baseline.  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  We will perform a walking oximetry today and see if she still needs oxygen.  If she does not desaturate then hopefully we can discontinue it.  We will perform pulmonary function testing to establish her current baseline.  If she has obstruction we can restart Dulera, I will stop it for now.  She can keep albuterol available to use if needed.  We will plan to repeat her CT scan of the chest to compare with her hospitalizations to see if her infiltrates have resolved, if there is residual scar present.  Walking oximetry on room air today. Stop Dulera Keep albuterol available to use 2 puffs if you need it for shortness of breath, chest tightness, wheezing. We will arrange for full pulmonary function testing in next office visit We will arrange for a high-resolution CT scan of the chest to evaluate for interstitial lung disease Follow with Dr. Delton Coombes next available with full pulmonary function testing on the same day.  Levy Pupa, MD, PhD 11/09/2019, 5:17 PM [facility] [phone] or if no answer [phone]

## 2019-11-09 NOTE — Assessment & Plan Note (Signed)
Prolonged course with associated hypoxemia following COVID-19 pneumonia.  She has been on oxygen 2 L/min since her most recent discharge.  Other factors contributing to her hypoxemia likely include obesity/OHS, her narcotics requirement.  There was no evidence of recurrent PE on any of her recent studies.  Explained to her that the typical course is 1 of improvement hopefully back to baseline.  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  We will perform a walking oximetry today and see if she still needs oxygen.  If she does not desaturate then hopefully we can discontinue it.  We will perform pulmonary function testing to establish her current baseline.  If she has obstruction we can restart Dulera, I will stop it for now.  She can keep albuterol available to use if needed.  We will plan to repeat her CT scan of the chest to compare with her hospitalizations to see if her infiltrates have resolved, if there is residual scar present.  Walking oximetry on room air today. Stop Dulera Keep albuterol available to use 2 puffs if you need it for shortness of breath, chest tightness, wheezing. We will arrange for full pulmonary function testing in next office visit We will arrange for a high-resolution CT scan of the chest to evaluate for interstitial lung disease Follow with Dr. Delton Coombes next available with full pulmonary function testing on the same day.

## 2019-11-10 ENCOUNTER — Telehealth: Payer: Self-pay | Admitting: Emergency Medicine

## 2019-11-10 NOTE — Addendum Note (Signed)
Addended by: Dorisann Frames R on: 11/10/2019 08:23 AM   Modules accepted: Orders

## 2019-11-10 NOTE — Telephone Encounter (Signed)
Pt called back to schedule appt for PFT, OV, and Covid test. Made all three appts for pt, nothing further is needed.

## 2019-11-10 NOTE — Addendum Note (Signed)
Addended by: Dorisann Frames R on: 11/10/2019 10:03 AM   Modules accepted: Orders

## 2019-11-15 ENCOUNTER — Telehealth (INDEPENDENT_AMBULATORY_CARE_PROVIDER_SITE_OTHER): Payer: BC Managed Care – PPO | Admitting: Family Medicine

## 2019-11-15 ENCOUNTER — Ambulatory Visit: Payer: BC Managed Care – PPO | Admitting: Licensed Clinical Social Worker

## 2019-11-15 DIAGNOSIS — M48061 Spinal stenosis, lumbar region without neurogenic claudication: Secondary | ICD-10-CM | POA: Diagnosis not present

## 2019-11-15 DIAGNOSIS — U071 COVID-19: Secondary | ICD-10-CM | POA: Diagnosis not present

## 2019-11-15 DIAGNOSIS — L659 Nonscarring hair loss, unspecified: Secondary | ICD-10-CM | POA: Diagnosis not present

## 2019-11-15 DIAGNOSIS — J1282 Pneumonia due to coronavirus disease 2019: Secondary | ICD-10-CM | POA: Diagnosis not present

## 2019-11-16 ENCOUNTER — Other Ambulatory Visit (INDEPENDENT_AMBULATORY_CARE_PROVIDER_SITE_OTHER): Payer: BC Managed Care – PPO

## 2019-11-16 ENCOUNTER — Other Ambulatory Visit: Payer: Self-pay

## 2019-11-16 ENCOUNTER — Other Ambulatory Visit: Payer: BC Managed Care – PPO

## 2019-11-16 ENCOUNTER — Encounter: Payer: Self-pay | Admitting: Family Medicine

## 2019-11-16 DIAGNOSIS — L659 Nonscarring hair loss, unspecified: Secondary | ICD-10-CM

## 2019-11-16 LAB — CBC WITH DIFFERENTIAL/PLATELET
Basophils Absolute: 0.1 10*3/uL (ref 0.0–0.1)
Basophils Relative: 1 % (ref 0.0–3.0)
Eosinophils Absolute: 0.3 10*3/uL (ref 0.0–0.7)
Eosinophils Relative: 2.9 % (ref 0.0–5.0)
HCT: 44.9 % (ref 36.0–46.0)
Hemoglobin: 15.3 g/dL — ABNORMAL HIGH (ref 12.0–15.0)
Lymphocytes Relative: 41.3 % (ref 12.0–46.0)
Lymphs Abs: 4.3 10*3/uL — ABNORMAL HIGH (ref 0.7–4.0)
MCHC: 34.1 g/dL (ref 30.0–36.0)
MCV: 86.7 fl (ref 78.0–100.0)
Monocytes Absolute: 0.7 10*3/uL (ref 0.1–1.0)
Monocytes Relative: 6.5 % (ref 3.0–12.0)
Neutro Abs: 5.1 10*3/uL (ref 1.4–7.7)
Neutrophils Relative %: 48.3 % (ref 43.0–77.0)
Platelets: 361 10*3/uL (ref 150.0–400.0)
RBC: 5.18 Mil/uL — ABNORMAL HIGH (ref 3.87–5.11)
RDW: 13.7 % (ref 11.5–15.5)
WBC: 10.5 10*3/uL (ref 4.0–10.5)

## 2019-11-16 LAB — BASIC METABOLIC PANEL
BUN: 15 mg/dL (ref 6–23)
CO2: 33 mEq/L — ABNORMAL HIGH (ref 19–32)
Calcium: 9.5 mg/dL (ref 8.4–10.5)
Chloride: 100 mEq/L (ref 96–112)
Creatinine, Ser: 1.08 mg/dL (ref 0.40–1.20)
GFR: 54.77 mL/min — ABNORMAL LOW (ref >60.00)
Glucose, Bld: 156 mg/dL — ABNORMAL HIGH (ref 70–99)
Potassium: 3.7 mEq/L (ref 3.5–5.1)
Sodium: 140 mEq/L (ref 135–145)

## 2019-11-16 LAB — TSH: TSH: 1.52 u[IU]/mL (ref 0.35–4.50)

## 2019-11-16 LAB — IBC + FERRITIN
Ferritin: 64.2 ng/mL (ref 10.0–291.0)
Iron: 97 ug/dL (ref 42–145)
Saturation Ratios: 25.7 % (ref 20.0–50.0)
Transferrin: 270 mg/dL (ref 212.0–360.0)

## 2019-11-16 LAB — HEPATIC FUNCTION PANEL
ALT: 20 U/L (ref 0–35)
AST: 23 U/L (ref 0–37)
Albumin: 4.3 g/dL (ref 3.5–5.2)
Alkaline Phosphatase: 68 U/L (ref 39–117)
Bilirubin, Direct: 0.1 mg/dL (ref 0.0–0.3)
Total Bilirubin: 0.7 mg/dL (ref 0.2–1.2)
Total Protein: 7.4 g/dL (ref 6.0–8.3)

## 2019-11-16 LAB — IRON: Iron: 97 ug/dL (ref 42–145)

## 2019-11-16 LAB — SEDIMENTATION RATE: Sed Rate: 20 mm/hr (ref 0–20)

## 2019-11-16 LAB — VITAMIN B12: Vitamin B-12: 659 pg/mL (ref 211–911)

## 2019-11-16 LAB — T3, FREE: T3, Free: 4.1 pg/mL (ref 2.3–4.2)

## 2019-11-16 LAB — T4, FREE: Free T4: 0.88 ng/dL (ref 0.60–1.60)

## 2019-11-16 NOTE — Addendum Note (Signed)
Addended by: Lerry Liner on: 11/16/2019 03:45 PM   Modules accepted: Orders

## 2019-11-16 NOTE — Progress Notes (Signed)
Subjective:    Patient ID: [name], female    DOB: 1975/07/18, 44 y.o.   MRN: [id number]  HPI Virtual Visit via Video Note  I connected with the patient on 11/16/19 at  3:15 PM EDT by a video enabled telemedicine application and verified that I am speaking with the correct person using two identifiers.  Location patient: home Location provider:work or home office Persons participating in the virtual visit: patient, provider  I discussed the limitations of evaluation and management by telemedicine and the availability of in person appointments. The patient expressed understanding and agreed to proceed.   HPI: Here to discuss several issues. She continues to recover from Covid pneumonia, and she saw Dr. Delton Coombes on 11-09-19. He has stopped her oxygen, since her sats never drop below 90% now. She still has it at home just in case. He has ordered PFT's and a high resolution chest CT. He stopped the [facility], and she now uses only albuterol as needed. Her low back pain has been getting worse, and she will see Dr. [name] for this next week. Her main concern today is hair loss. She has been losing a lot of hair over the past 2 months, and she thinks the rate of loss is accelerating.    ROS: See pertinent positives and negatives per HPI.  Past Medical History:  Diagnosis Date  . Allergy   . Anxiety   . Arthritis   . B12 deficiency   . Chronic narcotic use   . Depression    not currently   . Dysrhythmia    hx tachy-was from medication nortriptylline  . Esophagitis    rx  . Fibromyalgia   . GERD (gastroesophageal reflux disease)   . History of echocardiogram    Echo 4/18: Mild concentric LVH, vigorous LVF, EF 65-70, normal wall motion, grade 1 diastolic dysfunction  . History of kidney stones   . Hyperlipidemia   . Intervertebral disc protrusion 01/11/2014  . Migraine   . Obesity (BMI 35.0-39.9 without comorbidity)   . Peripheral neuropathy   . Polycystic ovaries   . Pulmonary  embolus ([facility])   . Spinal stenosis, lumbar   . Vitamin D deficiency     Past Surgical History:  Procedure Laterality Date  . 24 HOUR PH STUDY N/A 05/27/2016   Procedure: 24 HOUR PH STUDY;  Surgeon: Ruffin Frederick, MD;  Location: [facility];  Service: Gastroenterology;  Laterality: N/A;  . ABDOMINAL EXPOSURE N/A 05/04/2018   Procedure: ABDOMINAL EXPOSURE;  Surgeon: Larina Earthly, MD;  Location: [facility] OR;  Service: Vascular;  Laterality: N/A;  . ANTERIOR LUMBAR FUSION N/A 05/04/2018   Procedure: Lumbar Five Sacral One Anterior lumbar interbody fusion;  Surgeon: Donalee Citrin, MD;  Location: [facility] OR;  Service: Neurosurgery;  Laterality: N/A;  Lumbar Five Sacral One Anterior lumbar interbody fusion  . APPENDECTOMY    . BACK SURGERY  2012   left laminectomy at L3-4 per Dr. Phoebe Perch   . bladder tack  2012  . CHOLECYSTECTOMY N/A 10/03/2015   Procedure: LAPAROSCOPIC CHOLECYSTECTOMY;  Surgeon: Rodman Pickle, MD;  Location: [facility] OR;  Service: General;  Laterality: N/A;  . ESOPHAGEAL MANOMETRY N/A 05/27/2016   Procedure: ESOPHAGEAL MANOMETRY (EM);  Surgeon: Ruffin Frederick, MD;  Location: [facility];  Service: Gastroenterology;  Laterality: N/A;  . EXCISION OF SKIN TAG  10/03/2015   Procedure: EXCISION OF SKIN TAG;  Surgeon: De Blanch Kinsinger, MD;  Location: [facility] OR;  Service: General;;  . LUMBAR  DISC SURGERY  01-31-14   per Dr. [name]   . OOPHORECTOMY     left overy  . OVARIAN CYST REMOVAL    . RECTOCELE REPAIR    . SINUSOTOMY  12/14/06  . spinal fusion  2016/10  . TONSILLECTOMY    . TONSILLECTOMY    . VAGINAL HYSTERECTOMY    . WRIST GANGLION EXCISION Left     Family History  Problem Relation Age of Onset  . Fibromyalgia Mother   . Diabetes Mother   . Thyroid cancer Mother   . Liver disease Mother   . Neuropathy Mother   . Cirrhosis Mother        non-alcoholic  . Pulmonary embolism Mother        both lungs  . Heart failure Mother   . Hypertension Father   . Diabetes Father    . Heart disease Father   . Prostate cancer Father   . Heart attack Father        x 2  . Colon cancer Neg Hx   . Other Neg Hx        pheochromocytoma  . Colon polyps Neg Hx      Current Outpatient Medications:  .  albuterol (VENTOLIN HFA) 108 (90 Base) MCG/ACT inhaler, Inhale 2 puffs into the lungs every 4 (four) hours as needed for wheezing or shortness of breath., Disp: 18 g, Rfl: 11 .  ALPRAZolam (XANAX) 0.5 MG tablet, Take 0.5 mg by mouth 3 (three) times daily as needed for anxiety. , Disp: , Rfl:  .  ascorbic acid (VITAMIN C) 500 MG tablet, Take 1 tablet (500 mg total) by mouth daily., Disp:  , Rfl:  .  cetirizine (ZYRTEC) 10 MG tablet, Take 10 mg by mouth daily., Disp: , Rfl:  .  chlorhexidine (PERIDEX) 0.12 % solution, Use as directed 15 mLs in the mouth or throat 2 (two) times daily as needed (to clean partial). , Disp: , Rfl:  .  cyanocobalamin (,VITAMIN B-12,) 1000 MCG/ML injection, INJECT 1 ML (1,000 MCG TOTAL) INTO THE MUSCLE EVERY 7 DAYS. (Patient taking differently: Inject 1,000 mcg into the muscle once a week. ), Disp: 4 mL, Rfl: 8 .  diazepam (VALIUM) 5 MG tablet, Take 1 tablet (5 mg total) by mouth every 8 (eight) hours as needed for muscle spasms (muscle spasm/pain)., Disp: 90 tablet, Rfl: 5 .  famotidine (PEPCID) 40 MG tablet, TAKE 1 TABLET BY MOUTH EVERYDAY AT BEDTIME, Disp: 30 tablet, Rfl: 5 .  fluticasone (FLONASE) 50 MCG/ACT nasal spray, Place 2 sprays into both nostrils every evening. , Disp: , Rfl:  .  furosemide (LASIX) 20 MG tablet, Take 2 tablets (40 mg total) by mouth daily for 5 days. Do not take hydrochlorothiazide while taking Lasix, Disp: 30 tablet, Rfl: 2 .  guaiFENesin (MUCINEX) 600 MG 12 hr tablet, Take 600 mg by mouth 2 (two) times daily., Disp: , Rfl:  .  guaiFENesin-dextromethorphan (ROBITUSSIN DM) 100-10 MG/5ML syrup, Take 10 mLs by mouth every 4 (four) hours as needed for cough., Disp: 118 mL, Rfl: 0 .  hydrochlorothiazide (HYDRODIURIL) 25 MG tablet,  TAKE 1 TABLET BY MOUTH EVERY DAY (Patient taking differently: Take 25 mg by mouth daily. ), Disp: 30 tablet, Rfl: 5 .  [START ON 12/19/2019] HYDROmorphone (DILAUDID) 8 MG tablet, Take 1 tablet (8 mg total) by mouth every 6 (six) hours as needed for severe pain. (Patient not taking: Reported on 11/09/2019), Disp: 30 tablet, Rfl: 0 .  Menthol-Methyl Salicylate (BEN GAY  GREASELESS) 10-15 % greaseless cream, Apply 1 application topically as needed (for muscle pain). (Patient not taking: Reported on 11/09/2019), Disp: 30 g, Rfl: 0 .  mometasone-formoterol (DULERA) 100-5 MCG/ACT AERO, Inhale 2 puffs into the lungs 2 (two) times daily for 5 days. Use for 5 days then stop., Disp: 8.8 g, Rfl: 0 .  Multiple Vitamins-Minerals (MULTI-VITAMIN GUMMIES PO), Take 2 tablets by mouth daily. , Disp: , Rfl:  .  nystatin (MYCOSTATIN) 100000 UNIT/ML suspension, Take 5 mLs (500,000 Units total) by mouth 4 (four) times daily., Disp: 60 mL, Rfl: 0 .  [START ON 12/19/2019] oxyCODONE (ROXICODONE) 15 MG immediate release tablet, Take 1 tablet (15 mg total) by mouth every 4 (four) hours as needed for pain., Disp: 180 tablet, Rfl: 0 .  pantoprazole (PROTONIX) 40 MG tablet, Take 1 tablet (40 mg total) by mouth every morning. Take 30 minutes before breakfast., Disp: 90 tablet, Rfl: 3 .  promethazine (PHENERGAN) 25 MG tablet, TAKE 1 TABLET BY MOUTH EVERY 6 HOURS AS NEEDED FOR NAUSEA (Patient taking differently: Take 25 mg by mouth every 6 (six) hours as needed for nausea or vomiting. ), Disp: 90 tablet, Rfl: 1 .  sucralfate (CARAFATE) 1 g tablet, Take 1 tablet (1 g total) by mouth 4 (four) times daily. (Patient taking differently: Take 1 g by mouth daily. ), Disp: 120 tablet, Rfl: 2 .  SUMAtriptan (IMITREX) 100 MG tablet, Take 1 tablet earliest onset of migraine.  May repeat once in 2 hours if headache persists or recurs. (Patient taking differently: Take 100 mg by mouth every 2 (two) hours as needed for migraine. ), Disp: 10 tablet, Rfl:  2 .  SYRINGE-NEEDLE, DISP, 3 ML (B-D 3CC LUER-LOK SYR 25GX1") 25G X 1" 3 ML MISC, USE AS DIRECTED, Disp: 9 each, Rfl: 11 .  triamcinolone cream (KENALOG) 0.1 %, Apply 1 application topically 2 (two) times daily., Disp: 45 g, Rfl: 2 .  Vitamin D, Ergocalciferol, (DRISDOL) 1.25 MG (50000 UNIT) CAPS capsule, TAKE 1 CAPSULE (50,000 UNITS TOTAL) BY MOUTH EVERY 7 (SEVEN) DAYS., Disp: 12 capsule, Rfl: 3  EXAM:  VITALS per patient if applicable:  GENERAL: alert, oriented, appears well and in no acute distress  HEENT: atraumatic, conjunttiva clear, no obvious abnormalities on inspection of external nose and ears  NECK: normal movements of the head and neck  LUNGS: on inspection no signs of respiratory distress, breathing rate appears normal, no obvious gross SOB, gasping or wheezing  CV: no obvious cyanosis  MS: moves all visible extremities without noticeable abnormality  PSYCH/NEURO: pleasant and cooperative, no obvious depression or anxiety, speech and thought processing grossly intact  ASSESSMENT AND PLAN: She will follow up with Dr. Delton Coombes and Dr. [name] as above. I think her hair loss os the result of all the stress on her body as the result of battling Covid pneumonia and possibly as a result of all the steroids she was treated with. However she will come by the lab tomorrow to check a thyroid panel and ither possible etiologies.  Gershon Crane, MD  Discussed the following assessment and plan:  No diagnosis found.     I discussed the assessment and treatment plan with the patient. The patient was provided an opportunity to ask questions and all were answered. The patient agreed with the plan and demonstrated an understanding of the instructions.   The patient was advised to call back or seek an in-person evaluation if the symptoms worsen or if the condition fails to improve as  anticipated.     Review of Systems     Objective:   Physical Exam        Assessment & Plan:

## 2019-11-21 DIAGNOSIS — U071 COVID-19: Secondary | ICD-10-CM | POA: Diagnosis not present

## 2019-11-23 ENCOUNTER — Ambulatory Visit
Admission: RE | Admit: 2019-11-23 | Discharge: 2019-11-23 | Disposition: A | Discharge status: Home / Self Care | Payer: BC Managed Care – PPO | Source: Ambulatory Visit | Attending: Emergency Medicine | Admitting: Emergency Medicine

## 2019-11-23 DIAGNOSIS — J849 Interstitial pulmonary disease, unspecified: Secondary | ICD-10-CM

## 2019-11-23 DIAGNOSIS — R918 Other nonspecific abnormal finding of lung field: Secondary | ICD-10-CM | POA: Diagnosis not present

## 2019-11-27 NOTE — Telephone Encounter (Signed)
Please let her know that her CT shows residual inflammatory and probably also some mild scarring changes from her COVID 19 PNA.   Although we are still learning about how people heal from COVID, many patients continue to have improvement in how they feels and in there imaging for months after infection. Only a very small subset of patients have persistent or recurrent inflammation or lung damage after they have cleared the virus. We will follow her closely to insure that she doesn't show any signs of decline or progression, but the overwhelming likelihood is that she will not.   Bottom line:  -she may still get some improvement in her lung function over the next few months -she will probably be left with some mild scarring following all of this  -there is very little chance that this will flare or get worse, which means no scary prognosis like "3-5 years".   Levy Pupa, MD, PhD 11/27/2019, 3:32 PM [facility] [phone] or if no answer [phone]

## 2019-11-29 ENCOUNTER — Ambulatory Visit: Payer: BC Managed Care – PPO | Admitting: Licensed Clinical Social Worker

## 2019-12-02 IMAGING — CT CT ANGIO CHEST
3 of 8 series · 13 of 36 positions shown · IV contrast (iopamidol)
Comparison: CTA chest dated 06/06/2018

CLINICAL DATA: Chest pain, shortness of breath, history of right
lower lobe pulmonary emboli

EXAM:
CT ANGIOGRAPHY CHEST WITH CONTRAST
TECHNIQUE: Multidetector CT imaging of the chest was performed using the
standard protocol during bolus administration of intravenous
contrast. Multiplanar CT image reconstructions and MIPs were
obtained to evaluate the vascular anatomy.
CONTRAST:  100mL [id number] IOPAMIDOL ([id number]) INJECTION 76%

[Series 7: pe axial st · axial · 0.73mm/px · z∈[+1006,+1177]mm · 4 of 95 slices shown]
[im 19/95  lung]
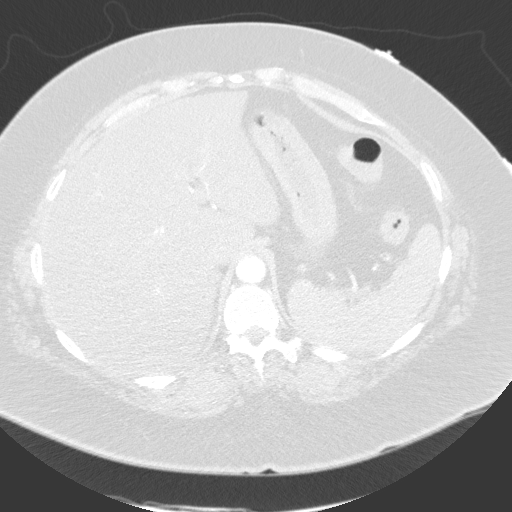
[im 38/95  mediastinal]
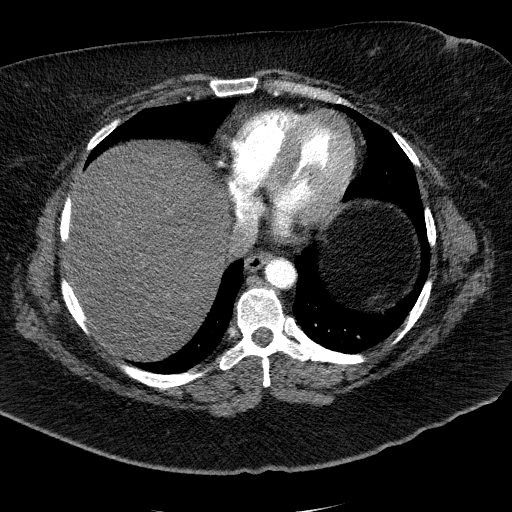
[im 57/95  lung]
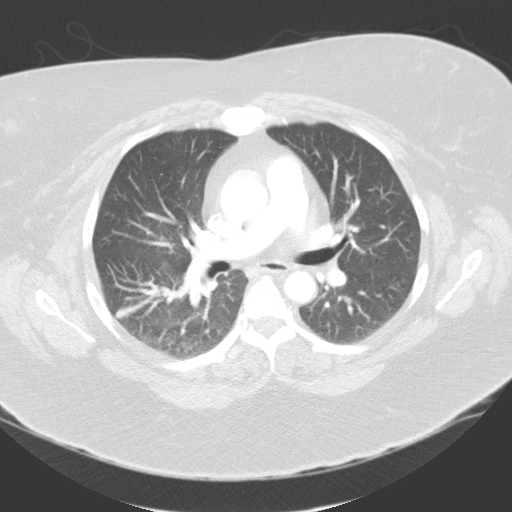
[im 76/95  mediastinal]
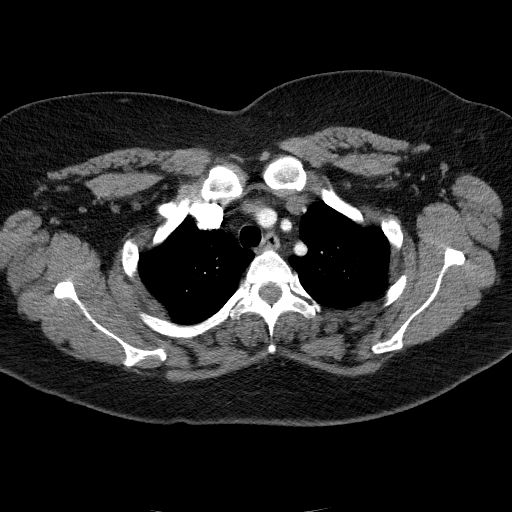

[Series 9: pe coronal mpr · coronal · 0.58mm/px · 1 of 86 slices shown]
[im 43/86  mediastinal]
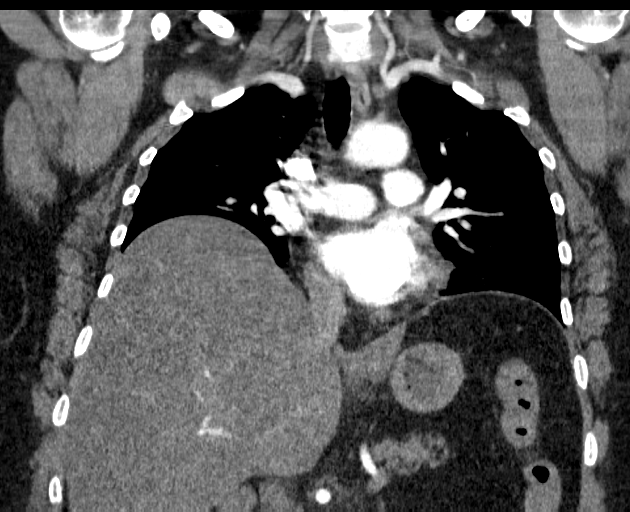

[Series 12: pe thins · axial · 0.73mm/px · z∈[+1044,+1211]mm · 8 of 283 slices shown]
[im 30/283  lung]
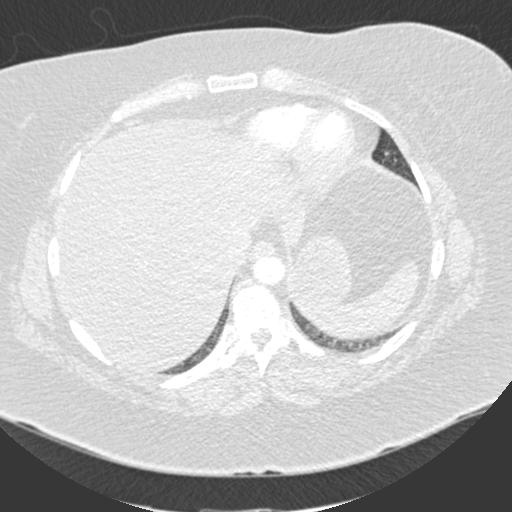
[im 60/283  lung]
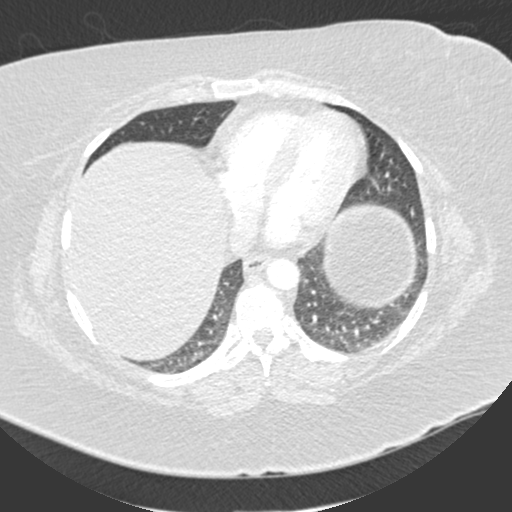
[im 90/283  lung]
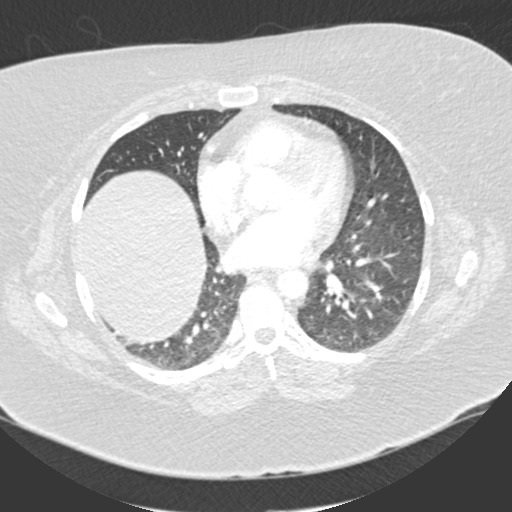
[im 119/283  lung]
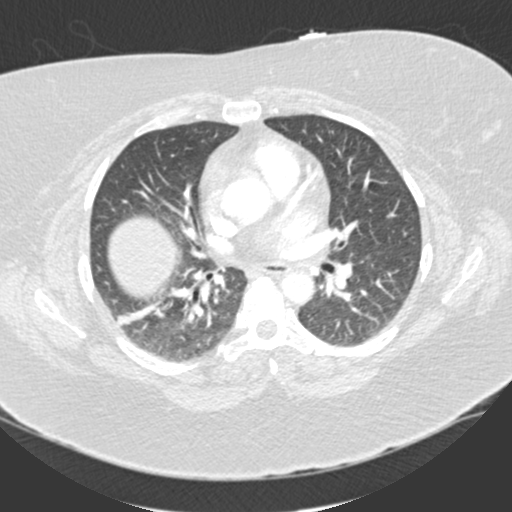
[im 164/283  lung]
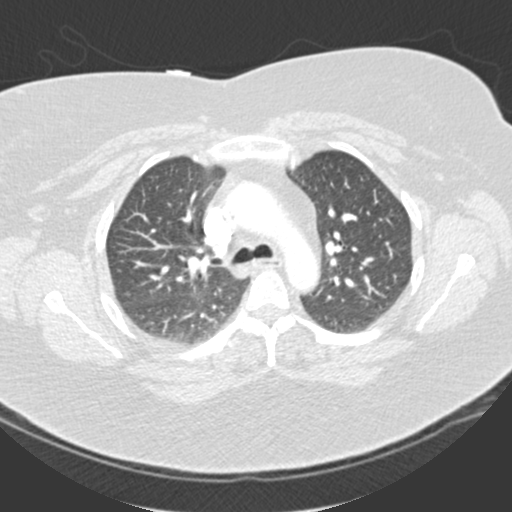
[im 193/283  lung]
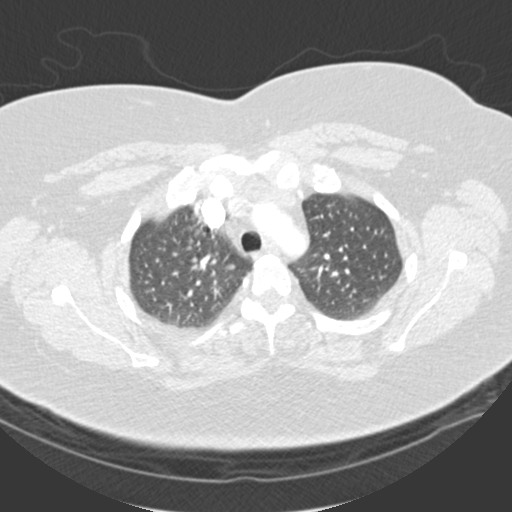
[im 223/283  lung]
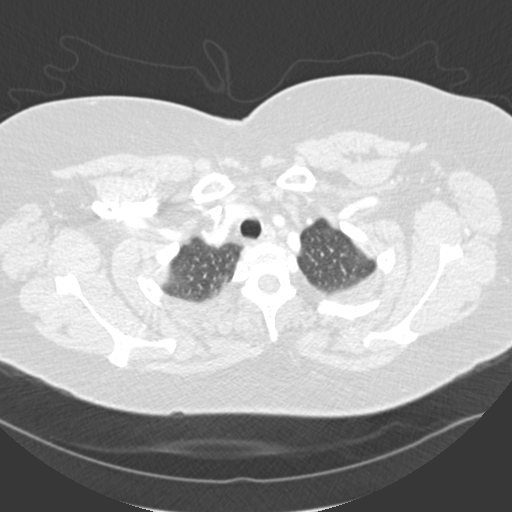
[im 253/283  lung]
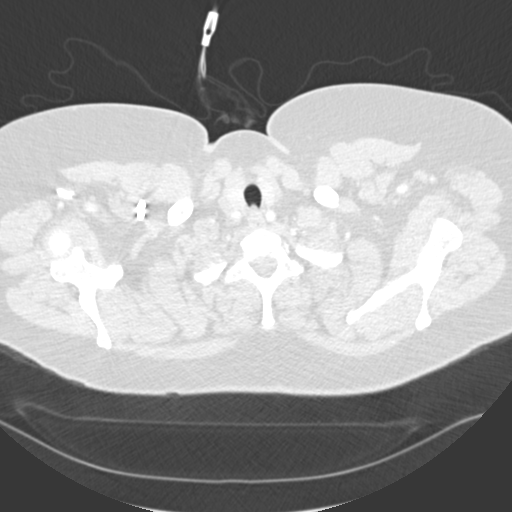

[13 of 36 positions shown; findings below may reference images not displayed]

FINDINGS: Cardiovascular: Satisfactory opacification of the bilateral
pulmonary arteries to the segmental level.

Tiny segmental filling defect within the right lower lobe pulmonary
artery (series 12/image 155). Additional tiny subsegmental filling
defect with a right lower lobe pulmonary artery (series 12/image
133). This appearance is similar versus mildly improved from the
prior.

Overall clot burden is minimal.  No evidence of right heart strain.

Heart is normal in size.  No pericardial effusion.

No evidence of thoracic aortic aneurysm or dissection.

Mediastinum/Nodes: No suspicious mediastinal lymphadenopathy.

Visualized thyroid is unremarkable.

Lungs/Pleura: Platelike scarring/atelectasis in the right lower
lobe.

No focal consolidation.

No suspicious pulmonary nodules.

No pleural effusion or pneumothorax.

Upper Abdomen: Visualized upper abdomen is notable for moderate to
severe hepatic steatosis and prior cholecystectomy.

Musculoskeletal: Mild degenerative changes of the mid thoracic
spine.

Review of the MIP images confirms the above findings.
IMPRESSION: Tiny segmental/subsegmental pulmonary emboli within the right lower
lobe pulmonary artery, stable versus mildly improved from recent CTA
chest.

## 2019-12-07 DIAGNOSIS — L650 Telogen effluvium: Secondary | ICD-10-CM | POA: Diagnosis not present

## 2019-12-07 DIAGNOSIS — Z79899 Other long term (current) drug therapy: Secondary | ICD-10-CM | POA: Diagnosis not present

## 2019-12-12 ENCOUNTER — Encounter: Payer: Self-pay | Admitting: Family Medicine

## 2019-12-13 ENCOUNTER — Ambulatory Visit: Payer: BC Managed Care – PPO | Admitting: Licensed Clinical Social Worker

## 2019-12-13 NOTE — Telephone Encounter (Signed)
I understand. Since she is already on prescription strength vitamin D, there is not much else for use to do but give it time

## 2019-12-15 ENCOUNTER — Other Ambulatory Visit: Payer: Self-pay | Admitting: Nurse Practitioner

## 2019-12-15 ENCOUNTER — Telehealth: Payer: Self-pay | Admitting: Nurse Practitioner

## 2019-12-15 MED ORDER — PANTOPRAZOLE SODIUM 40 MG PO TBEC: 40.0000 mg | DELAYED_RELEASE_TABLET | ORAL | 1 refills | Status: DC

## 2019-12-15 NOTE — Telephone Encounter (Signed)
Patient has been advised that a script has been sent to the pharmacy for a 90 day supply.

## 2019-12-15 NOTE — Telephone Encounter (Signed)
Pt states her pharmacy indicated to her that the pantoprazole medication did not approve for another refill. Pt would like to know the reason for this.

## 2019-12-18 ENCOUNTER — Other Ambulatory Visit: Payer: Self-pay | Admitting: Family Medicine

## 2019-12-21 ENCOUNTER — Other Ambulatory Visit: Payer: Self-pay | Admitting: Neurosurgery

## 2019-12-21 DIAGNOSIS — M461 Sacroiliitis, not elsewhere classified: Secondary | ICD-10-CM

## 2019-12-21 DIAGNOSIS — M5440 Lumbago with sciatica, unspecified side: Secondary | ICD-10-CM | POA: Diagnosis not present

## 2019-12-21 DIAGNOSIS — U071 COVID-19: Secondary | ICD-10-CM | POA: Diagnosis not present

## 2019-12-22 ENCOUNTER — Other Ambulatory Visit (HOSPITAL_COMMUNITY)
Admission: RE | Admit: 2019-12-22 | Discharge: 2019-12-22 | Disposition: A | Discharge status: Home / Self Care | Payer: BC Managed Care – PPO | Source: Ambulatory Visit | Attending: Emergency Medicine | Admitting: Emergency Medicine

## 2019-12-22 DIAGNOSIS — Z20822 Contact with and (suspected) exposure to covid-19: Secondary | ICD-10-CM | POA: Diagnosis not present

## 2019-12-22 LAB — SARS CORONAVIRUS 2 (TAT 6-24 HRS): SARS Coronavirus 2: NEGATIVE

## 2019-12-25 ENCOUNTER — Encounter: Payer: Self-pay | Admitting: Emergency Medicine

## 2019-12-25 ENCOUNTER — Other Ambulatory Visit: Payer: Self-pay

## 2019-12-25 ENCOUNTER — Ambulatory Visit (INDEPENDENT_AMBULATORY_CARE_PROVIDER_SITE_OTHER): Payer: BC Managed Care – PPO | Admitting: Emergency Medicine

## 2019-12-25 ENCOUNTER — Ambulatory Visit: Payer: BC Managed Care – PPO | Admitting: Emergency Medicine

## 2019-12-25 DIAGNOSIS — J1282 Pneumonia due to coronavirus disease 2019: Secondary | ICD-10-CM

## 2019-12-25 DIAGNOSIS — U071 COVID-19: Secondary | ICD-10-CM | POA: Diagnosis not present

## 2019-12-25 LAB — PULMONARY FUNCTION TEST
DL/VA % pred: 147 %
DL/VA: 6.21 ml/min/mmHg/L
DLCO cor % pred: 108 %
DLCO cor: 27.99 ml/min/mmHg
DLCO unc % pred: 114 %
DLCO unc: 29.5 ml/min/mmHg
FEF 25-75 Post: 3.57 L/sec
FEF 25-75 Pre: 4.41 L/sec
FEF2575-%Change-Post: -19 %
FEF2575-%Pred-Post: 108 %
FEF2575-%Pred-Pre: 134 %
FEV1-%Change-Post: -5 %
FEV1-%Pred-Post: 82 %
FEV1-%Pred-Pre: 87 %
FEV1-Post: 2.85 L
FEV1-Pre: 3.02 L
FEV1FVC-%Change-Post: 1 %
FEV1FVC-%Pred-Pre: 109 %
FEV6-%Change-Post: -7 %
FEV6-%Pred-Post: 74 %
FEV6-%Pred-Pre: 80 %
FEV6-Post: 3.16 L
FEV6-Pre: 3.41 L
FEV6FVC-%Pred-Post: 102 %
FEV6FVC-%Pred-Pre: 102 %
FVC-%Change-Post: -7 %
FVC-%Pred-Post: 73 %
FVC-%Pred-Pre: 78 %
FVC-Post: 3.16 L
FVC-Pre: 3.41 L
Post FEV1/FVC ratio: 90 %
Post FEV6/FVC ratio: 100 %
Pre FEV1/FVC ratio: 89 %
Pre FEV6/FVC Ratio: 100 %
RV % pred: 93 %
RV: 1.83 L
TLC % pred: 86 %
TLC: 5.07 L

## 2019-12-25 NOTE — Assessment & Plan Note (Signed)
With residual dyspnea and hypoxemia.  Her CT chest is markedly improved, has very mild residual interstitial disease present.  Likewise her pulmonary function testing is reassuring without any evidence of obstruction, some subtle evidence for restriction with a normal TLC.  I think that she has recovered from COVID-19 but needs to work on her cardiopulmonary conditioning.  I did discuss with her the rare occurrence of possible subsequent flaring leading to ILD or obstruction.  If her breathing changes then we will pursue this.  She no longer needs supplemental oxygen and we will discontinue.  The recommendations are for her to go ahead and get her second Pfizer vaccine since she had the first 1 prior to her original Covid diagnosis.  She can go ahead and do this now

## 2019-12-25 NOTE — Progress Notes (Signed)
Full PFT performed today. °

## 2019-12-25 NOTE — Patient Instructions (Signed)
Your CT scan of the chest is significantly improved compared with April.  There is still a small amount of interstitial change present that likely reflects some scarring from your COVID-19 pneumonia. Your pulmonary function testing suggests some mild restriction on the size of each breath but your total lung capacity remains normal.  This is good news.  There is no evidence for any change consistent with asthma. We can officially discontinue your home oxygen Recommendation is for you to proceed to get the second Pfizer COVID-19 vaccine.  Go ahead and work on scheduling this. Work on your exercise and conditioning.  This will help to further improve your breathing Follow with Dr Delton Coombes in 6 months to ensure that your breathing remains stable.  Call if you experience any changes in the interim.

## 2019-12-25 NOTE — Progress Notes (Signed)
Subjective:    Patient ID: [name], female    DOB: 17-Mar-1976, 44 y.o.   MRN: [id number]  HPI 44 year old obese woman, former smoker (4 pack-years), with a history of fibromyalgia, PCOD, depression, chronic back pain with chronic narcotic use, migraines, pulmonary embolism 06/2018 treated with xarelto x 6 months.  She was admitted on 3/29, received steroids and remdesivir, discharged on RA but then re-admitted 09/15/2019 for progressive dyspnea, cough, fatigue after being diagnosed with COVID-19 08/24/2019.  She had been treated as an outpatient with corticosteroids but had progressive symptoms.  She received corticosteroids, empiric diuresis, oxygen. She was sent home on BD's - isn't using reliably.   CT chest done on 08/28/2019 reviewed, shows no pulmonary embolism, no adenopathy, but diffuse bilateral groundglass infiltrates without effusions.  A repeat CT chest was done 09/11/2019 when she was admitted.  On my review no PE, no lymphadenopathy, some mild improvement in her groundglass infiltrates with persistent patchy changes and some evolving streakiness.  ROV 12/25/19 --follow-up visit 44 year old woman with a minimal tobacco history, pulmonary embolism (treated) to discuss persistent dyspnea and prolonged hypoxemia after COVID-19 pneumonia March, April 2021.  Has been some question of obstructive lung disease and she had been on [facility] which was stopped last time.  She did not desaturate on a walking oximetry at her initial visit although she did have dyspnea, did have to stop.  She reports that she is improving, feels better, although she has noticed exertional tachycardia. She has not missed her BD's.   High-resolution CT scan of the chest done 11/24/2019, reviewed by me, shows a significant improvement in her groundglass opacities compared with 09/11/2019, Some residual postinflammatory change and suggestion of some patchy air trapping that would be consistent with small airways  disease.  Pulmonary function testing performed today, reviewed by me shows evidence for restricted spirometry without a bronchodilator response, normal lung volumes, normal diffusion capacity.   Review of Systems As per HPI     Objective:   Physical Exam Vitals:   12/25/19 1334  BP: 118/68  Pulse: (!) 120  Temp: 98.4 F (36.9 C)  TempSrc: Oral  SpO2: 97%  Weight: (!) 277 lb (125.6 kg)  Height: 5' 9.5" (1.765 m)   Gen: Pleasant, obese woman, in no distress,  normal affect  ENT: No lesions,  mouth clear,  oropharynx clear, no postnasal drip  Neck: No JVD, no stridor  Lungs: No use of accessory muscles, small breaths, no crackles or wheezing on normal respiration, no wheeze on forced expiration  Cardiovascular: RRR, heart sounds normal, no murmur or gallops, no peripheral edema  Musculoskeletal: No deformities, no cyanosis or clubbing  Neuro: alert, awake, non focal  Skin: Warm, no lesions or rash       Assessment & Plan:  Pneumonia due to COVID-19 virus With residual dyspnea and hypoxemia.  Her CT chest is markedly improved, has very mild residual interstitial disease present.  Likewise her pulmonary function testing is reassuring without any evidence of obstruction, some subtle evidence for restriction with a normal TLC.  I think that she has recovered from COVID-19 but needs to work on her cardiopulmonary conditioning.  I did discuss with her the rare occurrence of possible subsequent flaring leading to ILD or obstruction.  If her breathing changes then we will pursue this.  She no longer needs supplemental oxygen and we will discontinue.  The recommendations are for her to go ahead and get her second Pfizer vaccine since she had the  first 1 prior to her original Covid diagnosis.  She can go ahead and do this now  Levy Pupa, MD, PhD 12/25/2019, 2:12 PM [facility] [phone] or if no answer [phone]

## 2019-12-27 ENCOUNTER — Ambulatory Visit: Payer: BC Managed Care – PPO | Admitting: Licensed Clinical Social Worker

## 2020-01-02 ENCOUNTER — Ambulatory Visit
Admission: RE | Admit: 2020-01-02 | Discharge: 2020-01-02 | Disposition: A | Discharge status: Home / Self Care | Payer: BC Managed Care – PPO | Source: Ambulatory Visit | Attending: Neurosurgery | Admitting: Neurosurgery

## 2020-01-02 ENCOUNTER — Other Ambulatory Visit: Payer: Self-pay

## 2020-01-02 DIAGNOSIS — M533 Sacrococcygeal disorders, not elsewhere classified: Secondary | ICD-10-CM | POA: Diagnosis not present

## 2020-01-02 DIAGNOSIS — M461 Sacroiliitis, not elsewhere classified: Secondary | ICD-10-CM

## 2020-01-02 MED ORDER — IOPAMIDOL (ISOVUE-M 200) INJECTION 41%
1.0000 mL | Freq: Once | INTRAMUSCULAR | Status: AC
  Administered 2020-01-02: 1 mL via INTRA_ARTICULAR

## 2020-01-02 MED ORDER — METHYLPREDNISOLONE ACETATE 40 MG/ML INJ SUSP (RADIOLOG
120.0000 mg | Freq: Once | INTRAMUSCULAR | Status: AC
  Administered 2020-01-02: 120 mg via INTRA_ARTICULAR

## 2020-01-10 ENCOUNTER — Ambulatory Visit: Payer: BC Managed Care – PPO | Admitting: Licensed Clinical Social Worker

## 2020-01-19 ENCOUNTER — Encounter: Payer: Self-pay | Admitting: Family Medicine

## 2020-01-19 ENCOUNTER — Telehealth (INDEPENDENT_AMBULATORY_CARE_PROVIDER_SITE_OTHER): Payer: BC Managed Care – PPO | Admitting: Family Medicine

## 2020-01-19 ENCOUNTER — Telehealth: Payer: Self-pay | Admitting: Family Medicine

## 2020-01-19 DIAGNOSIS — G4700 Insomnia, unspecified: Secondary | ICD-10-CM | POA: Diagnosis not present

## 2020-01-19 DIAGNOSIS — F1190 Opioid use, unspecified, uncomplicated: Secondary | ICD-10-CM

## 2020-01-19 DIAGNOSIS — M48061 Spinal stenosis, lumbar region without neurogenic claudication: Secondary | ICD-10-CM | POA: Diagnosis not present

## 2020-01-19 MED ORDER — OXYCODONE HCL 15 MG PO TABS: 15.0000 mg | ORAL_TABLET | ORAL | 0 refills | Status: DC | PRN

## 2020-01-19 MED ORDER — OXYCODONE HCL 15 MG PO TABS: 15.0000 mg | ORAL_TABLET | ORAL | 0 refills | Status: AC | PRN

## 2020-01-19 MED ORDER — HYDROMORPHONE HCL 8 MG PO TABS: 8.0000 mg | ORAL_TABLET | Freq: Four times a day (QID) | ORAL | 0 refills | Status: DC | PRN

## 2020-01-19 MED ORDER — TRAZODONE HCL 100 MG PO TABS: 100.0000 mg | ORAL_TABLET | Freq: Every day | ORAL | 5 refills | Status: DC

## 2020-01-19 NOTE — Progress Notes (Signed)
Subjective:    Patient ID: [name], female    DOB: 01/20/1976, 44 y.o.   MRN: [id number]  HPI Virtual Visit via Video Note  I connected with the patient on 01/19/20 at  9:45 AM EDT by a video enabled telemedicine application and verified that I am speaking with the correct person using two identifiers.  Location patient: home Location provider:work or home office Persons participating in the virtual visit: patient, provider  I discussed the limitations of evaluation and management by telemedicine and the availability of in person appointments. The patient expressed understanding and agreed to proceed.   HPI: Here for pain management. She is struggling with back pain. She recently saw Dr. [name] and he said her fusion surgery is not healing the way he expected it would. h He has ordered a bone stimulator for her to try to accelerate this process. Also she has trouble sleeping. OTC measures like melatonin and CBD oil do not help. She has used Ambien in the past, but she is concerned about how this will mix with her pain medications.  Indication for chronic opioid: low back pain Medication and dose: Oxycodone 15 mg and Dilaudid 8 mg # pills per month: 180 and 30 Last UDS date: 04-10-19 Opioid Treatment Agreement signed (Y/N): 04-17-19 Opioid Treatment Agreement last reviewed with patient:  01-19-20 [facility] reviewed this encounter (include red flags): Yes    ROS: See pertinent positives and negatives per HPI.  Past Medical History:  Diagnosis Date  . Allergy   . Anxiety   . Arthritis   . B12 deficiency   . Chronic narcotic use   . Depression    not currently   . Dysrhythmia    hx tachy-was from medication nortriptylline  . Esophagitis    rx  . Fibromyalgia   . GERD (gastroesophageal reflux disease)   . History of echocardiogram    Echo 4/18: Mild concentric LVH, vigorous LVF, EF 65-70, normal wall motion, grade 1 diastolic dysfunction  . History of kidney stones   .  Hyperlipidemia   . Intervertebral disc protrusion 01/11/2014  . Migraine   . Obesity (BMI 35.0-39.9 without comorbidity)   . Peripheral neuropathy   . Polycystic ovaries   . Pulmonary embolus (HCC)   . Spinal stenosis, lumbar   . Vitamin D deficiency     Past Surgical History:  Procedure Laterality Date  . 24 HOUR PH STUDY N/A 05/27/2016   Procedure: 24 HOUR PH STUDY;  Surgeon: Ruffin Frederick, MD;  Location: [facility];  Service: Gastroenterology;  Laterality: N/A;  . ABDOMINAL EXPOSURE N/A 05/04/2018   Procedure: ABDOMINAL EXPOSURE;  Surgeon: Larina Earthly, MD;  Location[facility];  Service: Vascular;  Laterality: N/A;  . ANTERIOR LUMBAR FUSION N/A 05/04/2018   Procedure: Lumbar Five Sacral One Anterior lumbar interbody fusion;  Surgeon: Donalee Citrin, MD;  Location: [facility] OR;  Service: Neurosurgery;  Laterality: N/A;  Lumbar Five Sacral One Anterior lumbar interbody fusion  . APPENDECTOMY    . BACK SURGERY  2012   left laminectomy at L3-4 per Dr. Phoebe Perch   . bladder tack  2012  . CHOLECYSTECTOMY N/A 10/03/2015   Procedure: LAPAROSCOPIC CHOLECYSTECTOMY;  Surgeon: Rodman Pickle, MD;  Location: [facility] OR;  Service: General;  Laterality: N/A;  . ESOPHAGEAL MANOMETRY N/A 05/27/2016   Procedure: ESOPHAGEAL MANOMETRY (EM);  Surgeon: Ruffin Frederick, MD;  Location: [facility];  Service: Gastroenterology;  Laterality: N/A;  . EXCISION OF SKIN TAG  10/03/2015  Procedure: EXCISION OF SKIN TAG;  Surgeon: De Blanch Kinsinger, MD;  Location: [facility] OR;  Service: General;;  . LUMBAR DISC SURGERY  01-31-14   per Dr. [name]   . OOPHORECTOMY     left overy  . OVARIAN CYST REMOVAL    . RECTOCELE REPAIR    . SINUSOTOMY  12/14/06  . spinal fusion  2016/10  . TONSILLECTOMY    . TONSILLECTOMY    . VAGINAL HYSTERECTOMY    . WRIST GANGLION EXCISION Left     Family History  Problem Relation Age of Onset  . Fibromyalgia Mother   . Diabetes Mother   . Thyroid cancer Mother   . Liver disease  Mother   . Neuropathy Mother   . Cirrhosis Mother        non-alcoholic  . Pulmonary embolism Mother        both lungs  . Heart failure Mother   . Hypertension Father   . Diabetes Father   . Heart disease Father   . Prostate cancer Father   . Heart attack Father        x 2  . Colon cancer Neg Hx   . Other Neg Hx        pheochromocytoma  . Colon polyps Neg Hx      Current Outpatient Medications:  .  albuterol (VENTOLIN HFA) 108 (90 Base) MCG/ACT inhaler, Inhale 2 puffs into the lungs every 4 (four) hours as needed for wheezing or shortness of breath., Disp: 18 g, Rfl: 11 .  ALPRAZolam (XANAX) 0.5 MG tablet, Take 0.5 mg by mouth 3 (three) times daily as needed for anxiety. , Disp: , Rfl:  .  ascorbic acid (VITAMIN C) 500 MG tablet, Take 1 tablet (500 mg total) by mouth daily., Disp:  , Rfl:  .  cetirizine (ZYRTEC) 10 MG tablet, Take 10 mg by mouth daily., Disp: , Rfl:  .  chlorhexidine (PERIDEX) 0.12 % solution, Use as directed 15 mLs in the mouth or throat 2 (two) times daily as needed (to clean partial). , Disp: , Rfl:  .  cyanocobalamin (,VITAMIN B-12,) 1000 MCG/ML injection, INJECT 1 ML (1,000 MCG TOTAL) INTO THE MUSCLE EVERY 7 DAYS. (Patient taking differently: Inject 1,000 mcg into the muscle once a week. ), Disp: 4 mL, Rfl: 8 .  diazepam (VALIUM) 5 MG tablet, Take 1 tablet (5 mg total) by mouth every 8 (eight) hours as needed for muscle spasms (muscle spasm/pain)., Disp: 90 tablet, Rfl: 5 .  famotidine (PEPCID) 40 MG tablet, TAKE 1 TABLET BY MOUTH EVERYDAY AT BEDTIME, Disp: 30 tablet, Rfl: 5 .  fluticasone (FLONASE) 50 MCG/ACT nasal spray, Place 2 sprays into both nostrils every evening. , Disp: , Rfl:  .  furosemide (LASIX) 20 MG tablet, TAKE 2 TABLETS DAILY FOR 5 DAYS. DO NOT TAKE HYDROCHLOROTHIAZIDE WHILE TAKING LASIX, Disp: 90 tablet, Rfl: 1 .  guaiFENesin (MUCINEX) 600 MG 12 hr tablet, Take 600 mg by mouth 2 (two) times daily., Disp: , Rfl:  .  guaiFENesin-dextromethorphan  (ROBITUSSIN DM) 100-10 MG/5ML syrup, Take 10 mLs by mouth every 4 (four) hours as needed for cough., Disp: 118 mL, Rfl: 0 .  hydrochlorothiazide (HYDRODIURIL) 25 MG tablet, TAKE 1 TABLET BY MOUTH EVERY DAY (Patient taking differently: Take 25 mg by mouth daily. ), Disp: 30 tablet, Rfl: 5 .  [START ON 03/20/2020] HYDROmorphone (DILAUDID) 8 MG tablet, Take 1 tablet (8 mg total) by mouth every 6 (six) hours as needed for severe pain., Disp:  30 tablet, Rfl: 0 .  Menthol-Methyl Salicylate (BEN GAY GREASELESS) 10-15 % greaseless cream, Apply 1 application topically as needed (for muscle pain)., Disp: 30 g, Rfl: 0 .  mometasone-formoterol (DULERA) 100-5 MCG/ACT AERO, Inhale 2 puffs into the lungs 2 (two) times daily for 5 days. Use for 5 days then stop., Disp: 8.8 g, Rfl: 0 .  Multiple Vitamins-Minerals (MULTI-VITAMIN GUMMIES PO), Take 2 tablets by mouth daily. , Disp: , Rfl:  .  nystatin (MYCOSTATIN) 100000 UNIT/ML suspension, Take 5 mLs (500,000 Units total) by mouth 4 (four) times daily., Disp: 60 mL, Rfl: 0 .  [START ON 02/19/2020] oxyCODONE (ROXICODONE) 15 MG immediate release tablet, Take 1 tablet (15 mg total) by mouth every 4 (four) hours as needed for pain., Disp: 180 tablet, Rfl: 0 .  pantoprazole (PROTONIX) 40 MG tablet, Take 1 tablet (40 mg total) by mouth every morning. Take 30 minutes before breakfast., Disp: 90 tablet, Rfl: 1 .  promethazine (PHENERGAN) 25 MG tablet, TAKE 1 TABLET BY MOUTH EVERY 6 HOURS AS NEEDED FOR NAUSEA (Patient taking differently: Take 25 mg by mouth every 6 (six) hours as needed for nausea or vomiting. ), Disp: 90 tablet, Rfl: 1 .  sucralfate (CARAFATE) 1 g tablet, Take 1 tablet (1 g total) by mouth 4 (four) times daily. (Patient taking differently: Take 1 g by mouth daily. ), Disp: 120 tablet, Rfl: 2 .  SUMAtriptan (IMITREX) 100 MG tablet, Take 1 tablet earliest onset of migraine.  May repeat once in 2 hours if headache persists or recurs. (Patient taking differently: Take  100 mg by mouth every 2 (two) hours as needed for migraine. ), Disp: 10 tablet, Rfl: 2 .  SYRINGE-NEEDLE, DISP, 3 ML (B-D 3CC LUER-LOK SYR 25GX1") 25G X 1" 3 ML MISC, USE AS DIRECTED, Disp: 9 each, Rfl: 11 .  traZODone (DESYREL) 100 MG tablet, Take 1 tablet (100 mg total) by mouth at bedtime., Disp: 30 tablet, Rfl: 5 .  triamcinolone cream (KENALOG) 0.1 %, Apply 1 application topically 2 (two) times daily., Disp: 45 g, Rfl: 2 .  Vitamin D, Ergocalciferol, (DRISDOL) 1.25 MG (50000 UNIT) CAPS capsule, TAKE 1 CAPSULE (50,000 UNITS TOTAL) BY MOUTH EVERY 7 (SEVEN) DAYS., Disp: 12 capsule, Rfl: 3  EXAM:  VITALS per patient if applicable:  GENERAL: alert, oriented, appears well and in no acute distress  HEENT: atraumatic, conjunttiva clear, no obvious abnormalities on inspection of external nose and ears  NECK: normal movements of the head and neck  LUNGS: on inspection no signs of respiratory distress, breathing rate appears normal, no obvious gross SOB, gasping or wheezing  CV: no obvious cyanosis  MS: moves all visible extremities without noticeable abnormality  PSYCH/NEURO: pleasant and cooperative, no obvious depression or anxiety, speech and thought processing grossly intact  ASSESSMENT AND PLAN: Pain management, meds were refilled. Also for sleep sh will try Trazodone 100 mg qhs.  Gershon Crane, MD  Discussed the following assessment and plan:  No diagnosis found.     I discussed the assessment and treatment plan with the patient. The patient was provided an opportunity to ask questions and all were answered. The patient agreed with the plan and demonstrated an understanding of the instructions.   The patient was advised to call back or seek an in-person evaluation if the symptoms worsen or if the condition fails to improve as anticipated.     Review of Systems     Objective:   Physical Exam  Assessment & Plan:

## 2020-01-19 NOTE — Telephone Encounter (Signed)
Spoke with the patient, she stated her pharmacy is out of her medication but has ordered it. They will not receive the order until next week. Ok for a temporary supply to last over the weekend?

## 2020-01-19 NOTE — Telephone Encounter (Signed)
[facility] is out of [facility], pt oesn't have enough to make it through the weekend.  Dr Clent Ridges called it this morning.  She needs Dr Clent Ridges to send it [facility] to [facility].

## 2020-01-21 DIAGNOSIS — U071 COVID-19: Secondary | ICD-10-CM | POA: Diagnosis not present

## 2020-02-03 ENCOUNTER — Telehealth: Payer: BC Managed Care – PPO | Admitting: Family

## 2020-02-03 DIAGNOSIS — K2100 Gastro-esophageal reflux disease with esophagitis, without bleeding: Secondary | ICD-10-CM

## 2020-02-03 MED ORDER — GI COCKTAIL ~~LOC~~: 30.0000 mL | Freq: Two times a day (BID) | ORAL | 0 refills | Status: DC

## 2020-02-03 NOTE — Progress Notes (Signed)
We are sorry that you are not feeling well.  Here is how we plan to help!  Based on what you shared with me it looks like you most likely have Gastroesophageal Reflux Disease (GERD)  Gastroesophageal reflux disease (GERD) happens when acid from your stomach flows up into the esophagus.  When acid comes in contact with the esophagus, the acid causes sorenss (inflammation) in the esophagus.  Over time, GERD may create small holes (ulcers) in the lining of the esophagus.  GI Cocktail was called in to the pharmacy to be taken twice a day, as needed. If you need this more than 2 days, see your primary care provider  Your symptoms should improve in the next day or two.  You can use antacids as needed until symptoms resolve.  Call us if your heartburn worsens, you have trouble swallowing, weight loss, spitting up blood or recurrent vomiting.  Home Care:  May include lifestyle changes such as weight loss, quitting smoking and alcohol consumption  Avoid foods and drinks that make your symptoms worse, such as:  Caffeine or alcoholic drinks  Chocolate  Peppermint or mint flavorings  Garlic and onions  Spicy foods  Citrus fruits, such as oranges, lemons, or limes  Tomato-based foods such as sauce, chili, salsa and pizza  Fried and fatty foods  Avoid lying down for 3 hours prior to your bedtime or prior to taking a nap  Eat small, frequent meals instead of a large meals  Wear loose-fitting clothing.  Do not wear anything tight around your waist that causes pressure on your stomach.  Raise the head of your bed 6 to 8 inches with wood blocks to help you sleep.  Extra pillows will not help.  Seek Help Right Away If:  You have pain in your arms, neck, jaw, teeth or back  Your pain increases or changes in intensity or duration  You develop nausea, vomiting or sweating (diaphoresis)  You develop shortness of breath or you faint  Your vomit is green, yellow, black or looks like coffee  grounds or blood  Your stool is red, bloody or black  These symptoms could be signs of other problems, such as heart disease, gastric bleeding or esophageal bleeding.  Make sure you :  Understand these instructions.  Will watch your condition.  Will get help right away if you are not doing well or get worse.  Your e-visit answers were reviewed by a board certified advanced clinical practitioner to complete your personal care plan.  Depending on the condition, your plan could have included both over the counter or prescription medications.  If there is a problem please reply  once you have received a response from your provider.  Your safety is important to [place].  If you have drug allergies check your prescription carefully.    You can use MyChart to ask questions about today's visit, request a non-urgent call back, or ask for a work or school excuse for 24 hours related to this e-Visit. If it has been greater than 24 hours you will need to follow up with your provider, or enter a new e-Visit to address those concerns.  You will get an e-mail in the next two days asking about your experience.  I hope that your e-visit has been valuable and will speed your recovery. Thank you for using e-visits.  Greater than 5 minutes, yet less than 10 minutes of time have been spent researching, coordinating, and implementing care for this patient today.  Thank you for the details you included in the comment boxes. Those details are very helpful in determining the best course of treatment for you and help [place] to provide the best care.

## 2020-02-04 ENCOUNTER — Other Ambulatory Visit: Payer: Self-pay | Admitting: Family Medicine

## 2020-02-06 NOTE — Telephone Encounter (Signed)
Last filled 05/13/2019 Last OV 01/19/2020  Ok to fill?

## 2020-02-10 ENCOUNTER — Other Ambulatory Visit: Payer: Self-pay | Admitting: Family Medicine

## 2020-02-12 ENCOUNTER — Other Ambulatory Visit: Payer: Self-pay

## 2020-02-13 ENCOUNTER — Encounter: Payer: Self-pay | Admitting: Family Medicine

## 2020-02-13 ENCOUNTER — Ambulatory Visit: Payer: BC Managed Care – PPO | Admitting: Family Medicine

## 2020-02-13 VITALS — BP 120/100 | HR 119 | Temp 98.7°F | Ht 69.5 in | Wt 274.4 lb

## 2020-02-13 DIAGNOSIS — J1282 Pneumonia due to coronavirus disease 2019: Secondary | ICD-10-CM | POA: Diagnosis not present

## 2020-02-13 DIAGNOSIS — U071 COVID-19: Secondary | ICD-10-CM | POA: Diagnosis not present

## 2020-02-13 DIAGNOSIS — K219 Gastro-esophageal reflux disease without esophagitis: Secondary | ICD-10-CM

## 2020-02-13 DIAGNOSIS — R300 Dysuria: Secondary | ICD-10-CM | POA: Diagnosis not present

## 2020-02-13 MED ORDER — ESOMEPRAZOLE MAGNESIUM 40 MG PO CPDR: 40.0000 mg | DELAYED_RELEASE_CAPSULE | Freq: Every day | ORAL | 5 refills | Status: DC

## 2020-02-14 ENCOUNTER — Encounter: Payer: Self-pay | Admitting: Family Medicine

## 2020-02-14 ENCOUNTER — Other Ambulatory Visit: Payer: Self-pay | Admitting: Family Medicine

## 2020-02-14 LAB — URINALYSIS
Bilirubin Urine: NEGATIVE
Glucose, UA: NEGATIVE
Hgb urine dipstick: NEGATIVE
Ketones, ur: NEGATIVE
Leukocytes,Ua: NEGATIVE
Nitrite: NEGATIVE
Protein, ur: NEGATIVE
Specific Gravity, Urine: 1.02 (ref 1.001–1.03)
pH: 6.5 (ref 5.0–8.0)

## 2020-02-14 NOTE — Progress Notes (Signed)
   Subjective:    Patient ID: [name], female    DOB: 08/10/1975, 44 y.o.   MRN: [id number]  HPI Here for several issues. First she is recovering from a Covid-19 infection, and she still has weakness and mild SOB. She wants me to check her lungs and to check for the presence of Covid antibodies. She also has urinary frequency and urgency, but no burning or fever. Lastly her GERD has not been well controlled. She has been taking Omeprazole 40 mg each morning and Famotidine 40 mg each eveing, but she still has frequent burning pains in the chest and upper abdomen with belching. She had an e visit on 02-03-20 about this and she was given a supply of GI cocktail. This worked quite well to relieve these symptoms.    Review of Systems  Constitutional: Positive for fatigue.  Respiratory: Positive for shortness of breath. Negative for cough and wheezing.   Cardiovascular: Positive for chest pain. Negative for palpitations and leg swelling.  Gastrointestinal: Positive for abdominal pain. Negative for abdominal distention, constipation, nausea and vomiting.  Genitourinary: Positive for frequency and urgency. Negative for dysuria and flank pain.       Objective:   Physical Exam Constitutional:      Appearance: Normal appearance. She is not ill-appearing.  Cardiovascular:     Rate and Rhythm: Normal rate and regular rhythm.     Pulses: Normal pulses.     Heart sounds: Normal heart sounds.  Pulmonary:     Effort: Pulmonary effort is normal. No respiratory distress.     Breath sounds: Normal breath sounds. No stridor. No wheezing, rhonchi or rales.  Abdominal:     General: Abdomen is flat. Bowel sounds are normal. There is no distension.     Palpations: Abdomen is soft. There is no mass.     Tenderness: There is no abdominal tenderness. There is no guarding or rebound.     Hernia: No hernia is present.  Neurological:     Mental Status: She is alert.           Assessment & Plan:  She  is having break through GERD symptoms. She will continue the nightly Famotidine, but in the mornings she will try Nexium 40 mg instead of Omeprazole. She will also get back on Sucralfate 1 gm QID (which she had stopped taking). She will follow up with GI. She is recovering well from a Covid infection. We will check for antibodies at her request. Check a UA for the urinary symptoms.  Gershon Crane, MD

## 2020-02-15 ENCOUNTER — Encounter: Payer: Self-pay | Admitting: Family Medicine

## 2020-02-15 LAB — SARS COV-2 SEROLOGY(COVID-19)AB(IGG,IGM),IMMUNOASSAY
SARS CoV-2 AB IgG: POSITIVE — AB
SARS CoV-2 IgM: NEGATIVE

## 2020-02-20 ENCOUNTER — Encounter: Payer: Self-pay | Admitting: Family Medicine

## 2020-02-20 DIAGNOSIS — K219 Gastro-esophageal reflux disease without esophagitis: Secondary | ICD-10-CM

## 2020-02-21 ENCOUNTER — Other Ambulatory Visit: Payer: Self-pay | Admitting: Adult Health

## 2020-02-21 DIAGNOSIS — U071 COVID-19: Secondary | ICD-10-CM | POA: Diagnosis not present

## 2020-02-21 MED ORDER — GI COCKTAIL ~~LOC~~: 30.0000 mL | Freq: Two times a day (BID) | ORAL | 0 refills | Status: DC

## 2020-02-22 ENCOUNTER — Telehealth: Payer: Self-pay | Admitting: Family Medicine

## 2020-02-22 DIAGNOSIS — M5440 Lumbago with sciatica, unspecified side: Secondary | ICD-10-CM | POA: Diagnosis not present

## 2020-02-22 NOTE — Telephone Encounter (Signed)
Hi [name]! I appreciate you getting that prescription handled for me. I was so excited to finally have a little relief. However. They didn't fill it for me because they said it's a compound and apparently many different ways to fix it. So they said they sent back a message requesting the medications and dosages for it and I kept checking back and then they said it was too late that the doctor would have responded by then. I didn't know if there was anyway you could help me in this situation.  I know y'all are super busy I'm just suffering and can't eat anything. Any help you could offer would be awesome but if you ant that's okay too. I appreciate everything you've done to help me.   Please advise

## 2020-02-23 NOTE — Telephone Encounter (Signed)
See my chart encounter.

## 2020-02-26 NOTE — Telephone Encounter (Signed)
The test just says positive (it does not give a titer or numbers). Yes this is a good thing, it means she likely has some immunity to the Covid virus now

## 2020-02-28 DIAGNOSIS — M960 Pseudarthrosis after fusion or arthrodesis: Secondary | ICD-10-CM | POA: Diagnosis not present

## 2020-03-01 ENCOUNTER — Telehealth: Payer: Self-pay | Admitting: Nurse Practitioner

## 2020-03-01 MED ORDER — LIDOCAINE VISCOUS HCL 2 % MT SOLN
OROMUCOSAL | 1 refills | Status: DC
Start: 2020-03-01 — End: 2020-06-19

## 2020-03-01 NOTE — Telephone Encounter (Signed)
Left message on machine to call back  

## 2020-03-01 NOTE — Addendum Note (Signed)
Addended by: Gershon Crane A on: 03/01/2020 09:59 AM   Modules accepted: Orders

## 2020-03-01 NOTE — Telephone Encounter (Signed)
It is very difficult to prescribe the GI cocktail, so instead I use viscous lidocaine. I sent in a rx for this to her pharmacy. I placed the GI referral on 02-21-20. I suggest she call the GI office herself to facilitate the appt. Maybe she can get on a cancellation list

## 2020-03-01 NOTE — Telephone Encounter (Signed)
Pt is requesting a call back from a nurse to discuss the symptoms she is having, pt is having a strong acid reflux, severe burning going on for about a week. Pt would like to be seen asap but no appts are available.  Pt is being referred by [facility]

## 2020-03-04 NOTE — Telephone Encounter (Signed)
The appointments are 5 weeks out. No triage appointments open. I would offer her next available and put her on the cancellation list for an APP and Dr Adela Lank.  Hope this helps.

## 2020-03-16 ENCOUNTER — Other Ambulatory Visit: Payer: Self-pay | Admitting: Family Medicine

## 2020-03-21 ENCOUNTER — Other Ambulatory Visit: Payer: Self-pay

## 2020-03-21 ENCOUNTER — Encounter: Payer: Self-pay | Admitting: Family Medicine

## 2020-03-21 NOTE — Telephone Encounter (Signed)
Last OV: 02/13/20 Last refill: 01/19/20 #30/0 refill  [facility] [place] [facility] is [facility] of [facility].

## 2020-03-21 NOTE — Telephone Encounter (Signed)
pharmacy called and wanted to know if the Dr would [facility] pharmacy is out of stock for this medication until Monday patient is aware and is ok with the medication going to the other pharmacy also     [facility] [place]    HYDROmorphone (DILAUDID) 8 MG tablet

## 2020-03-22 DIAGNOSIS — U071 COVID-19: Secondary | ICD-10-CM | POA: Diagnosis not present

## 2020-03-22 MED ORDER — HYDROMORPHONE HCL 8 MG PO TABS: 8.0000 mg | ORAL_TABLET | Freq: Four times a day (QID) | ORAL | 0 refills | Status: AC | PRN

## 2020-03-22 MED ORDER — OXYCODONE HCL 15 MG PO TABS: 15.0000 mg | ORAL_TABLET | ORAL | 0 refills | Status: AC | PRN

## 2020-03-22 NOTE — Telephone Encounter (Signed)
I sent in 5 day supplies of both meds until [facility] enough on Monday

## 2020-04-18 ENCOUNTER — Encounter: Payer: Self-pay | Admitting: Family Medicine

## 2020-04-18 ENCOUNTER — Other Ambulatory Visit: Payer: Self-pay

## 2020-04-18 ENCOUNTER — Ambulatory Visit: Payer: BC Managed Care – PPO | Admitting: Family Medicine

## 2020-04-18 VITALS — BP 144/95 | HR 114 | Ht 69.5 in | Wt 270.0 lb

## 2020-04-18 DIAGNOSIS — Z6838 Body mass index (BMI) 38.0-38.9, adult: Secondary | ICD-10-CM | POA: Diagnosis not present

## 2020-04-18 DIAGNOSIS — F1190 Opioid use, unspecified, uncomplicated: Secondary | ICD-10-CM

## 2020-04-18 DIAGNOSIS — M48061 Spinal stenosis, lumbar region without neurogenic claudication: Secondary | ICD-10-CM

## 2020-04-18 DIAGNOSIS — M5440 Lumbago with sciatica, unspecified side: Secondary | ICD-10-CM | POA: Diagnosis not present

## 2020-04-18 DIAGNOSIS — R030 Elevated blood-pressure reading, without diagnosis of hypertension: Secondary | ICD-10-CM | POA: Diagnosis not present

## 2020-04-18 MED ORDER — HYDROMORPHONE HCL 8 MG PO TABS: 8.0000 mg | ORAL_TABLET | Freq: Every day | ORAL | 0 refills | Status: DC

## 2020-04-18 MED ORDER — OXYCODONE HCL 15 MG PO TABS: 15.0000 mg | ORAL_TABLET | ORAL | 0 refills | Status: DC | PRN

## 2020-04-18 MED ORDER — ESCITALOPRAM OXALATE 10 MG PO TABS: 10.0000 mg | ORAL_TABLET | Freq: Every day | ORAL | 2 refills | Status: DC

## 2020-04-18 NOTE — Progress Notes (Signed)
   Subjective:    Patient ID: [name], female    DOB: February 02, 1976, 44 y.o.   MRN: [id number]  HPI Here for pain management. She is still dealing with a lot of pain. She has been using the bone stimulator that Dr. [name] gave her for about 2 months, and she thinks this has actually increased her pain. She will follow up with him later today. Indication for chronic opioid: low back pain Medication and dose: Oxycodone 15 mg and Dilaudid 8 mg  # pills per month: 180 and 30 Last UDS date: 04-17-20 Opioid Treatment Agreement signed (Y/N): 04-17-19 Opioid Treatment Agreement last reviewed with patient:  04-17-20 NCCSRS reviewed this encounter (include red flags): Yes   Review of Systems     Objective:   Physical Exam        Assessment & Plan:  Pain management, meds were refilled. She will aslo try Lexapro 10 mg daily for depression and anxiety. Gershon Crane, MD

## 2020-04-19 ENCOUNTER — Encounter: Payer: Self-pay | Admitting: Family Medicine

## 2020-04-19 ENCOUNTER — Telehealth: Payer: Self-pay | Admitting: Family Medicine

## 2020-04-19 NOTE — Telephone Encounter (Signed)
Yes it is okay to fill it early

## 2020-04-19 NOTE — Telephone Encounter (Signed)
Patient is calling back and stated that Dr. Clent Ridges gave her a prescription for oxycodone but the pharmacy needs Dr. Clent Ridges permission to fill early, please advise. [id number] [facility], [place] -  [address], [place] [address]  Phone:  [phone] Fax:  [phone]  CB is [phone]

## 2020-04-19 NOTE — Telephone Encounter (Signed)
See mychart encounter. Pharmacist will not fill Rx early.

## 2020-04-19 NOTE — Telephone Encounter (Signed)
Okay for early refill.

## 2020-04-21 LAB — DRUG MONITOR, PANEL 1, W/CONF, URINE
Alphahydroxyalprazolam: 143 ng/mL — ABNORMAL HIGH (ref <25)
Alphahydroxymidazolam: NEGATIVE ng/mL (ref <50)
Alphahydroxytriazolam: NEGATIVE ng/mL (ref <50)
Aminoclonazepam: NEGATIVE ng/mL (ref <25)
Amphetamines: NEGATIVE ng/mL (ref <500)
Barbiturates: NEGATIVE ng/mL (ref <300)
Benzodiazepines: POSITIVE ng/mL — AB (ref <100)
Cocaine Metabolite: NEGATIVE ng/mL (ref <150)
Codeine: NEGATIVE ng/mL (ref <50)
Creatinine: 284.6 mg/dL
Hydrocodone: NEGATIVE ng/mL (ref <50)
Hydromorphone: 114 ng/mL — ABNORMAL HIGH (ref <50)
Hydroxyethylflurazepam: NEGATIVE ng/mL (ref <50)
Lorazepam: NEGATIVE ng/mL (ref <50)
Marijuana Metabolite: 886 ng/mL — ABNORMAL HIGH (ref <5)
Marijuana Metabolite: POSITIVE ng/mL — AB (ref <20)
Methadone Metabolite: NEGATIVE ng/mL (ref <100)
Morphine: NEGATIVE ng/mL (ref <50)
Nordiazepam: NEGATIVE ng/mL (ref <50)
Norhydrocodone: NEGATIVE ng/mL (ref <50)
Noroxycodone: >10000 ng/mL — ABNORMAL HIGH (ref <50)
Opiates: POSITIVE ng/mL — AB (ref <100)
Oxazepam: NEGATIVE ng/mL (ref <50)
Oxidant: NEGATIVE ug/mL
Oxycodone: 8513 ng/mL — ABNORMAL HIGH (ref <50)
Oxycodone: POSITIVE ng/mL — AB (ref <100)
Oxymorphone: >10000 ng/mL — ABNORMAL HIGH (ref <50)
Phencyclidine: NEGATIVE ng/mL (ref <25)
Temazepam: NEGATIVE ng/mL (ref <50)
pH: 8.2 (ref 4.5–9.0)

## 2020-04-21 LAB — DM TEMPLATE

## 2020-04-22 ENCOUNTER — Other Ambulatory Visit: Payer: Self-pay | Admitting: Neurosurgery

## 2020-04-22 DIAGNOSIS — M5440 Lumbago with sciatica, unspecified side: Secondary | ICD-10-CM

## 2020-04-22 DIAGNOSIS — U071 COVID-19: Secondary | ICD-10-CM | POA: Diagnosis not present

## 2020-05-01 DIAGNOSIS — M150 Primary generalized (osteo)arthritis: Secondary | ICD-10-CM | POA: Diagnosis not present

## 2020-05-01 DIAGNOSIS — M2550 Pain in unspecified joint: Secondary | ICD-10-CM | POA: Diagnosis not present

## 2020-05-01 DIAGNOSIS — M797 Fibromyalgia: Secondary | ICD-10-CM | POA: Diagnosis not present

## 2020-05-01 DIAGNOSIS — G629 Polyneuropathy, unspecified: Secondary | ICD-10-CM | POA: Diagnosis not present

## 2020-05-13 ENCOUNTER — Other Ambulatory Visit: Payer: Self-pay | Admitting: Family Medicine

## 2020-05-14 DIAGNOSIS — Z6839 Body mass index (BMI) 39.0-39.9, adult: Secondary | ICD-10-CM | POA: Diagnosis not present

## 2020-05-14 DIAGNOSIS — Z01419 Encounter for gynecological examination (general) (routine) without abnormal findings: Secondary | ICD-10-CM | POA: Diagnosis not present

## 2020-05-20 ENCOUNTER — Ambulatory Visit
Admission: RE | Admit: 2020-05-20 | Discharge: 2020-05-20 | Disposition: A | Discharge status: Home / Self Care | Payer: BC Managed Care – PPO | Source: Ambulatory Visit | Attending: Neurosurgery | Admitting: Neurosurgery

## 2020-05-20 ENCOUNTER — Other Ambulatory Visit: Payer: Self-pay

## 2020-05-20 DIAGNOSIS — M545 Low back pain, unspecified: Secondary | ICD-10-CM | POA: Diagnosis not present

## 2020-05-20 DIAGNOSIS — M5440 Lumbago with sciatica, unspecified side: Secondary | ICD-10-CM

## 2020-05-22 DIAGNOSIS — U071 COVID-19: Secondary | ICD-10-CM | POA: Diagnosis not present

## 2020-05-28 ENCOUNTER — Telehealth: Payer: BC Managed Care – PPO | Admitting: Physician Assistant

## 2020-05-28 DIAGNOSIS — Z20822 Contact with and (suspected) exposure to covid-19: Secondary | ICD-10-CM | POA: Diagnosis not present

## 2020-05-28 MED ORDER — BENZONATATE 100 MG PO CAPS
100.0000 mg | ORAL_CAPSULE | Freq: Three times a day (TID) | ORAL | 0 refills | Status: DC | PRN
Start: 2020-05-28 — End: 2020-05-30

## 2020-05-28 NOTE — Progress Notes (Signed)
  E-Visit for [employer] Virus Screening  Based on what you have shared with me, you need to seek an evaluation for a severe illness that is causing your symptoms which may be coronavirus or some other illness. I recommend that you be seen and evaluated "face to face". If you are considered high risk for Corona virus because of a known exposure, fever, shortness of breath and cough, OR if you have severe symptoms of any kind, seek medical care at an emergency room. Our Emergency Departments are best equipped to handle patients with severe symptoms.  You will be evaluated by the ER provider (or higher level of care provider) who will determine whether you need formal testing.  I will be happy to write for tessalon, but the other medications cannot be given over an e-visit.  I am concerned about how sick you are in light of the fact that you were hospitalized last time.  If you are having a true medical emergency please call 911.   I recommend the following:  . [facility] Emergency Department [address] [place], [place], [place] [address] [phone]  . [facility] Emergency Department [address] [place], [place] [address] [phone]  . [facility] Emergency Department [address] [place], [place], [place] [address] [phone]  . [facility] Emergency Department [address] [place], [place], [place] [address] [phone]  . [facility] Emergency Department [address] [place], [place], [place] [address] [phone]  NOTE: If you entered your credit card information for this eVisit, you will not be charged. You may see a "hold" on your card for the $35 but that hold will drop off and you will not have a charge processed.   Your e-visit answers were reviewed by a board certified advanced clinical practitioner to complete your personal care plan.  Thank you for using e-Visits.  Greater  than 5 minutes, yet less than 10 minutes of time have been spent researching, coordinating, and implementing care for this patient today

## 2020-05-30 ENCOUNTER — Telehealth (INDEPENDENT_AMBULATORY_CARE_PROVIDER_SITE_OTHER): Payer: BC Managed Care – PPO | Admitting: Family Medicine

## 2020-05-30 DIAGNOSIS — R0981 Nasal congestion: Secondary | ICD-10-CM | POA: Diagnosis not present

## 2020-05-30 DIAGNOSIS — R059 Cough, unspecified: Secondary | ICD-10-CM | POA: Diagnosis not present

## 2020-05-30 DIAGNOSIS — R519 Headache, unspecified: Secondary | ICD-10-CM

## 2020-05-30 MED ORDER — BENZONATATE 100 MG PO CAPS: 100.0000 mg | ORAL_CAPSULE | Freq: Three times a day (TID) | ORAL | 0 refills | Status: DC | PRN

## 2020-05-30 MED ORDER — AMOXICILLIN-POT CLAVULANATE 875-125 MG PO TABS: 1.0000 | ORAL_TABLET | Freq: Two times a day (BID) | ORAL | 0 refills | Status: DC

## 2020-05-30 MED ORDER — ALBUTEROL SULFATE HFA 108 (90 BASE) MCG/ACT IN AERS: 2.0000 | INHALATION_SPRAY | Freq: Four times a day (QID) | RESPIRATORY_TRACT | 0 refills | Status: DC | PRN

## 2020-05-30 NOTE — Progress Notes (Signed)
Virtual Visit via Telephone Note  I connected with [name] on 05/30/20 at  1:20 PM EST by telephone and verified that I am speaking with the correct person using two identifiers.   I discussed the limitations, risks, security and privacy concerns of performing an evaluation and management service by telephone and the availability of in person appointments. I also discussed with the patient that there may be a patient responsible charge related to this service. The patient expressed understanding and agreed to proceed.  Location patient: home, [place] Location provider: work or home office Participants present for the call: patient, provider Patient did not have a visit with me in the prior 7 days to address this/these issue(s).   History of Present Illness:  Acute telemedicine visit for sinus issues: -Onset: 05/22/20 (daughter has omicron and influenza dx on the 25th, son was sick as well) -Symptoms include: nasal congestion, cough, PND, eye drainage, green snot, subjective fevers, bad cough initially, body aches initially, sinus pain and in upper teeth - she is worried about a sinus infection -O2 has been mostly > 96-97 -Denies: CP, SOB, NVD, inability to get out of bed and eat and drink -Has tried: tessalon which has helped -Pertinent past medical history: used albuterol in the past when covid remotely - but is out of the albuterol -Pertinent medication allergies: -COVID-19 vaccine status: fully vaccinated and had covid in the past and was hospitalized with it   Observations/Objective: Patient sounds cheerful and well on the phone. I do not appreciate any SOB. Speech and thought processing are grossly intact. Patient reported vitals:  Assessment and Plan:  Nasal sinus congestion  Cough  Facial discomfort  -we discussed possible serious and likely etiologies, options for evaluation and workup, limitations of telemedicine visit vs in person visit, treatment, treatment risks  and precautions. Pt prefers to treat via telemedicine empirically rather than in person at this moment.  Likely COVID-19 given the in-house exposure and onset of symptoms.  Discussed other potential etiologies as well.  She is concerned she possibly developing a sinus infection due to the worsening sinus drainage that is thick and discolored and sinus discomfort.  She opted for trial of nasal saline, Tessalon for cough as this is really helped in the past, also a refill on her albuterol which she has used in the past (currently not having wheezing or shortness of breath or chest tightness) and a antibiotic in case the sinus tach is worsening or not improving.  Sent Augmentin 875 twice daily x10 days.  Advised of treatment, isolation, potential complications precautions for Covid.  She does not want to seek in person care at this time. Scheduled follow up with PCP offered: she agrees to follow-up if needed Advised to seek prompt in person care if worsening, new symptoms arise, or if is not improving with treatment. Advised of options for inperson care in case PCP office not available. Did let the patient know that I only do telemedicine shifts for [facility] on Tuesdays and Thursdays and advised a follow up visit with PCP or at an [facility] if has further questions or concerns.   Follow Up Instructions:  I did not refer this patient for an OV with me in the next 24 hours for this/these issue(s).  I discussed the assessment and treatment plan with the patient. The patient was provided an opportunity to ask questions and all were answered. The patient agreed with the plan and demonstrated an understanding of the instructions.   I  spent 18 minutes on the date of this visit in the care of this patient. See summary of tasks completed to properly care for this patient in the detailed notes above which often include previsit review of PMH, medications, allergies, evaluation of the patient and ordering and instructing  patient on testing and care options.     Terressa Koyanagi, DO

## 2020-05-30 NOTE — Patient Instructions (Signed)
  HOME CARE TIPS:  -[place] COVID19 testing information: [email or web address] [place] [phone] Most pharmacies also offer testing and home test kits.  -I sent the medication(s) we discussed to your pharmacy: Meds ordered this encounter  Medications  . amoxicillin-clavulanate (AUGMENTIN) 875-125 MG tablet    Sig: Take 1 tablet by mouth 2 (two) times daily.    Dispense:  20 tablet    Refill:  0  . benzonatate (TESSALON PERLES) 100 MG capsule    Sig: Take 1 capsule (100 mg total) by mouth 3 (three) times daily as needed.    Dispense:  20 capsule    Refill:  0  . albuterol (PROAIR HFA) 108 (90 Base) MCG/ACT inhaler    Sig: Inhale 2 puffs into the lungs every 6 (six) hours as needed for wheezing or shortness of breath.    Dispense:  1 each    Refill:  0     -COVID19 outpatient treatment center: [phone] (only call if your Covid test is positive and you are interested in monoclonal antibody treatment which is available to those with risk factors within 10 days of symptom onset)  -can use tylenol or aleve if needed for fevers, aches and pains per instructions  -can use nasal saline a few times per day if nasal congestion, sometime a short course of Afrin nasal spray for 3 days can help as well  -stay hydrated, drink plenty of fluids and eat small healthy meals - avoid dairy  -can take 1000 IU Vit D3 and Vit C lozenges per instructions  -If the Covid test is positive, check out the CDC website for more information on home care, transmission and treatment for COVID19  -follow up with your doctor in 2-3 days unless improving and feeling better  -stay home while sick, except to seek medical care, and if you have COVID19 please stay home for a full 10 days since the onset of symptoms PLUS one day of no fever and feeling better.  It was nice to meet you today, and I really hope you are feeling better soon. I help  out with telemedicine  visits on Tuesdays and Thursdays and am available for visits on those days. If you have any concerns or questions following this visit please schedule a follow up visit with your Primary Care doctor or seek care at a local urgent care clinic to avoid delays in care.    Seek in person care promptly if your symptoms worsen, new concerns arise or you are not improving with treatment. Call 911 and/or seek emergency care if you symptoms are severe or life threatening.

## 2020-06-10 ENCOUNTER — Other Ambulatory Visit: Payer: Self-pay | Admitting: Family Medicine

## 2020-06-15 DIAGNOSIS — Z20822 Contact with and (suspected) exposure to covid-19: Secondary | ICD-10-CM | POA: Diagnosis not present

## 2020-06-18 NOTE — Progress Notes (Signed)
Virtual Visit via Video Note  I connected with@ on 06/19/20 at 10:30 AM EST by a video enabled telemedicine application and verified that I am speaking with the correct person using two identifiers. Location patient: home Location provider:work  office Persons participating in the virtual visit: patient, provider  [place] national recommendations  regarding COVID 19 pandemic   video visit is advised over in office visit for this patient.  Patient aware  of the limitations of evaluation and management by telemedicine and  availability of in person appointments. and agreed to proceed.  Attempting to  Connect   Patient camera blocked and  HPI: [name] presents for video visit  PCP appt NA  Immunized x 2  And had covid last year and some lung damage   Saw dr Derwood Kaplan that time   But now began  Having st then cough and severe nasal congestion but not like reg sinus infection    No real sob pulse ox in 94 range. Fatigue  Using  tessalon perles   But out   Some help.  No vomiting diarrhea  ROS: See pertinent positives and negatives per HPI.  Past Medical History:  Diagnosis Date  . Allergy   . Anxiety   . Arthritis   . B12 deficiency   . Chronic narcotic use   . Depression    not currently   . Dysrhythmia    hx tachy-was from medication nortriptylline  . Esophagitis    rx  . Fibromyalgia   . GERD (gastroesophageal reflux disease)   . History of echocardiogram    Echo 4/18: Mild concentric LVH, vigorous LVF, EF 65-70, normal wall motion, grade 1 diastolic dysfunction  . History of kidney stones   . Hyperlipidemia   . Intervertebral disc protrusion 01/11/2014  . Migraine   . Obesity (BMI 35.0-39.9 without comorbidity)   . Peripheral neuropathy   . Polycystic ovaries   . Pulmonary embolus (HCC)   . Spinal stenosis, lumbar   . Vitamin D deficiency     Past Surgical History:  Procedure Laterality Date  . 24 HOUR PH STUDY N/A 05/27/2016   Procedure: 24 HOUR PH STUDY;   Surgeon: Ruffin Frederick, MD;  Location: [facility];  Service: Gastroenterology;  Laterality: N/A;  . ABDOMINAL EXPOSURE N/A 05/04/2018   Procedure: ABDOMINAL EXPOSURE;  Surgeon: Larina Earthly, MD;  Location: [facility] OR;  Service: Vascular;  Laterality: N/A;  . ANTERIOR LUMBAR FUSION N/A 05/04/2018   Procedure: Lumbar Five Sacral One Anterior lumbar interbody fusion;  Surgeon: Donalee Citrin, MD;  Location[facility] OR;  Service: Neurosurgery;  Laterality: N/A;  Lumbar Five Sacral One Anterior lumbar interbody fusion  . APPENDECTOMY    . BACK SURGERY  2012   left laminectomy at L3-4 per Dr. Phoebe Perch   . bladder tack  2012  . CHOLECYSTECTOMY N/A 10/03/2015   Procedure: LAPAROSCOPIC CHOLECYSTECTOMY;  Surgeon: Rodman Pickle, MD;  Location: [facility] OR;  Service: General;  Laterality: N/A;  . ESOPHAGEAL MANOMETRY N/A 05/27/2016   Procedure: ESOPHAGEAL MANOMETRY (EM);  Surgeon: Ruffin Frederick, MD;  Location: [facility];  Service: Gastroenterology;  Laterality: N/A;  . EXCISION OF SKIN TAG  10/03/2015   Procedure: EXCISION OF SKIN TAG;  Surgeon: De Blanch Kinsinger, MD;  Location: [facility] OR;  Service: General;;  . LUMBAR DISC SURGERY  01-31-14   per Dr. [name]   . OOPHORECTOMY     left overy  . OVARIAN CYST REMOVAL    . RECTOCELE REPAIR    .  SINUSOTOMY  12/14/06  . spinal fusion  2016/10  . TONSILLECTOMY    . TONSILLECTOMY    . VAGINAL HYSTERECTOMY    . WRIST GANGLION EXCISION Left     Family History  Problem Relation Age of Onset  . Fibromyalgia Mother   . Diabetes Mother   . Thyroid cancer Mother   . Liver disease Mother   . Neuropathy Mother   . Cirrhosis Mother        non-alcoholic  . Pulmonary embolism Mother        both lungs  . Heart failure Mother   . Hypertension Father   . Diabetes Father   . Heart disease Father   . Prostate cancer Father   . Heart attack Father        x 2  . Colon cancer Neg Hx   . Other Neg Hx        pheochromocytoma  . Colon polyps Neg Hx     Social  History   Tobacco Use  . Smoking status: Former Smoker    Types: Cigarettes    Quit date: 1999    Years since quitting: 23.0  . Smokeless tobacco: Never Used  Vaping Use  . Vaping Use: Never used  Substance Use Topics  . Alcohol use: No    Alcohol/week: 0.0 standard drinks  . Drug use: No      Current Outpatient Medications:  .  ALPRAZolam (XANAX) 0.5 MG tablet, TAKE 1 TABLET BY MOUTH THREE TIMES A DAY AS NEEDED, Disp: 90 tablet, Rfl: 5 .  B-D 3CC LUER-LOK SYR 25GX1" 25G X 1" 3 ML MISC, USE AS DIRECTED, Disp: 9 each, Rfl: 2 .  benzonatate (TESSALON PERLES) 100 MG capsule, Take 1-2 capsules (100-200 mg total) by mouth 3 (three) times daily as needed., Disp: 24 capsule, Rfl: 1 .  cetirizine (ZYRTEC) 10 MG tablet, Take 10 mg by mouth daily., Disp: , Rfl:  .  CYANOCOBALAMIN IJ, Inject as directed once a week., Disp: , Rfl:  .  esomeprazole (NEXIUM) 40 MG capsule, Take 1 capsule (40 mg total) by mouth daily., Disp: 30 capsule, Rfl: 5 .  famotidine (PEPCID) 40 MG tablet, Take 1 tablet (40 mg total) by mouth at bedtime., Disp: 90 tablet, Rfl: 1 .  fluticasone (FLONASE) 50 MCG/ACT nasal spray, Place 2 sprays into both nostrils every evening., Disp: , Rfl:  .  furosemide (LASIX) 20 MG tablet, TAKE 2 TABLETS DAILY FOR 5 DAYS. DO NOT TAKE HYDROCHLOROTHIAZIDE WHILE TAKING LASIX, Disp: 90 tablet, Rfl: 1 .  hydrochlorothiazide (HYDRODIURIL) 25 MG tablet, TAKE 1 TABLET BY MOUTH EVERY DAY, Disp: 30 tablet, Rfl: 5 .  HYDROmorphone (DILAUDID) 8 MG tablet, Take 1 tablet (8 mg total) by mouth daily in the afternoon., Disp: 30 tablet, Rfl: 0 .  Multiple Vitamins-Minerals (MULTI-VITAMIN GUMMIES PO), Take 2 tablets by mouth daily., Disp: , Rfl:  .  oxyCODONE (ROXICODONE) 15 MG immediate release tablet, Take 1 tablet (15 mg total) by mouth every 4 (four) hours as needed for pain., Disp: 180 tablet, Rfl: 0 .  promethazine (PHENERGAN) 25 MG tablet, TAKE 1 TABLET BY MOUTH EVERY 6 HOURS AS NEEDED FOR NAUSEA  (Patient taking differently: Take 25 mg by mouth every 6 (six) hours as needed for nausea or vomiting.), Disp: 90 tablet, Rfl: 1 .  sucralfate (CARAFATE) 1 g tablet, Take 1 tablet (1 g total) by mouth 4 (four) times daily. (Patient taking differently: Take 1 g by mouth daily.), Disp: 120 tablet, Rfl:  2 .  SUMAtriptan (IMITREX) 100 MG tablet, Take 1 tablet earliest onset of migraine.  May repeat once in 2 hours if headache persists or recurs. (Patient taking differently: Take 100 mg by mouth every 2 (two) hours as needed for migraine.), Disp: 10 tablet, Rfl: 2 .  Vitamin D, Ergocalciferol, (DRISDOL) 1.25 MG (50000 UNIT) CAPS capsule, TAKE 1 CAPSULE (50,000 UNITS TOTAL) BY MOUTH EVERY 7 (SEVEN) DAYS., Disp: 12 capsule, Rfl: 3  EXAM: BP Readings from Last 3 Encounters:  04/18/20 (!) 144/95  02/13/20 (!) 120/100  01/02/20 (!) 164/90    VITALS per patient if applicable:  GENERAL: alert, oriented, appears mildly ill and in no acute distress looks congested but nl speech and non toxic  HEENT: atraumatic, conjunttiva clear, no obvious abnormalities on inspection of external nose and ears  NECK: normal movements of the head and neck  LUNGS: on inspection no signs of respiratory distress, breathing rate appears normal, no obvious gross SOB, gasping or wheezing ocass cough bronshial no stridor  CV: no obvious cyanosis  MS: moves all visible extremities without noticeable abnormality  PSYCH/NEURO: pleasant and cooperative, no obvious depression or anxiety, speech and thought processing grossly intact   ASSESSMENT AND PLAN:  Discussed the following assessment and plan:    ICD-10-CM   1. COVID-19 virus infection second infection  U07.1    second infection  presumed omicron  pos test CW  Clinical covid   No obv complication but has abnormal baseline lung function  After recovery.  Interesting that   covid x 2 break through after initial infection  Then vaccination  And now prob omicron wave.   Can refill tessalon perles  Follow pulse ox and reach out to pulmonary team  For further support as indicated   Counseled.   Expectant management and discussion of plan and treatment with opportunity to ask questions and all were answered. The patient agreed with the plan and demonstrated an understanding of the instructions.  review  Counsel  And visit  30 minutes  Advised to call back or seek an in-person evaluation if worsening  or having  further concerns . No follow-ups on file.    Berniece Andreas, MD

## 2020-06-19 ENCOUNTER — Encounter: Payer: Self-pay | Admitting: Internal Medicine

## 2020-06-19 ENCOUNTER — Telehealth (INDEPENDENT_AMBULATORY_CARE_PROVIDER_SITE_OTHER): Payer: BC Managed Care – PPO | Admitting: Internal Medicine

## 2020-06-19 DIAGNOSIS — U071 COVID-19: Secondary | ICD-10-CM | POA: Diagnosis not present

## 2020-06-19 MED ORDER — BENZONATATE 100 MG PO CAPS
100.0000 mg | ORAL_CAPSULE | Freq: Three times a day (TID) | ORAL | 1 refills | Status: DC | PRN
Start: 2020-06-19 — End: 2020-07-13

## 2020-06-20 ENCOUNTER — Telehealth: Payer: BC Managed Care – PPO | Admitting: Family Medicine

## 2020-06-20 ENCOUNTER — Other Ambulatory Visit: Payer: Self-pay | Admitting: Family Medicine

## 2020-06-22 DIAGNOSIS — U071 COVID-19: Secondary | ICD-10-CM | POA: Diagnosis not present

## 2020-06-25 DIAGNOSIS — M170 Bilateral primary osteoarthritis of knee: Secondary | ICD-10-CM | POA: Diagnosis not present

## 2020-07-02 DIAGNOSIS — R61 Generalized hyperhidrosis: Secondary | ICD-10-CM | POA: Diagnosis not present

## 2020-07-02 DIAGNOSIS — L650 Telogen effluvium: Secondary | ICD-10-CM | POA: Diagnosis not present

## 2020-07-02 DIAGNOSIS — L218 Other seborrheic dermatitis: Secondary | ICD-10-CM | POA: Diagnosis not present

## 2020-07-13 ENCOUNTER — Telehealth: Payer: BC Managed Care – PPO | Admitting: Nurse Practitioner

## 2020-07-13 DIAGNOSIS — J069 Acute upper respiratory infection, unspecified: Secondary | ICD-10-CM | POA: Diagnosis not present

## 2020-07-13 DIAGNOSIS — Z03818 Encounter for observation for suspected exposure to other biological agents ruled out: Secondary | ICD-10-CM | POA: Diagnosis not present

## 2020-07-13 DIAGNOSIS — Z20822 Contact with and (suspected) exposure to covid-19: Secondary | ICD-10-CM | POA: Diagnosis not present

## 2020-07-13 MED ORDER — BENZONATATE 100 MG PO CAPS: 100.0000 mg | ORAL_CAPSULE | Freq: Three times a day (TID) | ORAL | 0 refills | Status: DC | PRN

## 2020-07-13 MED ORDER — FLUTICASONE PROPIONATE 50 MCG/ACT NA SUSP: 2.0000 | Freq: Every day | NASAL | 6 refills | Status: AC

## 2020-07-13 NOTE — Progress Notes (Signed)
We are sorry you are not feeling well.  Here is how we plan to help!  Based on what you have shared with me, it looks like you may have a viral upper respiratory infection.  Upper respiratory infections are caused by a large number of viruses; however, rhinovirus is the most common cause.   Symptoms vary from person to person, with common symptoms including sore throat, cough, fatigue or lack of energy and feeling of general discomfort.  A low-grade fever of up to 100.4 may present, but is often uncommon.  Symptoms vary however, and are closely related to a person's age or underlying illnesses.  The most common symptoms associated with an upper respiratory infection are nasal discharge or congestion, cough, sneezing, headache and pressure in the ears and face.  These symptoms usually persist for about 3 to 10 days, but can last up to 2 weeks.  It is important to know that upper respiratory infections do not cause serious illness or complications in most cases.    Upper respiratory infections can be transmitted from person to person, with the most common method of transmission being a person's hands.  The virus is able to live on the skin and can infect other persons for up to 2 hours after direct contact.  Also, these can be transmitted when someone coughs or sneezes; thus, it is important to cover the mouth to reduce this risk.  To keep the spread of the illness at bay, good hand hygiene is very important.  This is an infection that is most likely caused by a virus. There are no specific treatments other than to help you with the symptoms until the infection runs its course.  We are sorry you are not feeling well.  Here is how we plan to help!   For nasal congestion, you may use an oral decongestants such as Mucinex D or if you have glaucoma or high blood pressure use plain Mucinex.  Saline nasal spray or nasal drops can help and can safely be used as often as needed for congestion.  For your congestion,  I have prescribed Fluticasone nasal spray one spray in each nostril twice a day  If you do not have a history of heart disease, hypertension, diabetes or thyroid disease, prostate/bladder issues or glaucoma, you may also use Sudafed to treat nasal congestion.  It is highly recommended that you consult with a pharmacist or your primary care physician to ensure this medication is safe for you to take.     If you have a cough, you may use cough suppressants such as Delsym and Robitussin.  If you have glaucoma or high blood pressure, you can also use Coricidin HBP.   For cough I have prescribed for you A prescription cough medication called Tessalon Perles 100 mg. You may take 1-2 capsules every 8 hours as needed for cough  If you have a sore or scratchy throat, use a saltwater gargle-  to  teaspoon of salt dissolved in a 4-ounce to 8-ounce glass of warm water.  Gargle the solution for approximately 15-30 seconds and then spit.  It is important not to swallow the solution.  You can also use throat lozenges/cough drops and Chloraseptic spray to help with throat pain or discomfort.  Warm or cold liquids can also be helpful in relieving throat pain.  For headache, pain or general discomfort, you can use Ibuprofen or Tylenol as directed.   Some authorities believe that zinc sprays or the use of   Echinacea may shorten the course of your symptoms.   HOME CARE . Only take medications as instructed by your medical team. . Be sure to drink plenty of fluids. Water is fine as well as fruit juices, sodas and electrolyte beverages. You may want to stay away from caffeine or alcohol. If you are nauseated, try taking small sips of liquids. How do you know if you are getting enough fluid? Your urine should be a pale yellow or almost colorless. . Get rest. . Taking a steamy shower or using a humidifier may help nasal congestion and ease sore throat pain. You can place a towel over your head and breathe in the steam  from hot water coming from a faucet. . Using a saline nasal spray works much the same way. . Cough drops, hard candies and sore throat lozenges may ease your cough. . Avoid close contacts especially the very young and the elderly . Cover your mouth if you cough or sneeze . Always remember to wash your hands.   GET HELP RIGHT AWAY IF: . You develop worsening fever. . If your symptoms do not improve within 10 days . You develop yellow or green discharge from your nose over 3 days. . You have coughing fits . You develop a severe head ache or visual changes. . You develop shortness of breath, difficulty breathing or start having chest pain . Your symptoms persist after you have completed your treatment plan  MAKE SURE YOU   Understand these instructions.  Will watch your condition.  Will get help right away if you are not doing well or get worse.  Your e-visit answers were reviewed by a board certified advanced clinical practitioner to complete your personal care plan. Depending upon the condition, your plan could have included both over the counter or prescription medications. Please review your pharmacy choice. If there is a problem, you may call our nursing hot line at and have the prescription routed to another pharmacy. Your safety is important to us. If you have drug allergies check your prescription carefully.   You can use MyChart to ask questions about today's visit, request a non-urgent call back, or ask for a work or school excuse for 24 hours related to this e-Visit. If it has been greater than 24 hours you will need to follow up with your provider, or enter a new e-Visit to address those concerns. You will get an e-mail in the next two days asking about your experience.  I hope that your e-visit has been valuable and will speed your recovery. Thank you for using e-visits.   5-10 minutes spent reviewing and documenting in chart.    

## 2020-07-15 ENCOUNTER — Other Ambulatory Visit: Payer: Self-pay | Admitting: Family Medicine

## 2020-07-15 NOTE — Telephone Encounter (Signed)
Please review and advise on refill request for Fluconazole.  Thanks.  Dm/cma

## 2020-07-16 DIAGNOSIS — M5440 Lumbago with sciatica, unspecified side: Secondary | ICD-10-CM | POA: Diagnosis not present

## 2020-07-16 DIAGNOSIS — Z6838 Body mass index (BMI) 38.0-38.9, adult: Secondary | ICD-10-CM | POA: Diagnosis not present

## 2020-07-16 DIAGNOSIS — R030 Elevated blood-pressure reading, without diagnosis of hypertension: Secondary | ICD-10-CM | POA: Diagnosis not present

## 2020-07-17 ENCOUNTER — Telehealth (INDEPENDENT_AMBULATORY_CARE_PROVIDER_SITE_OTHER): Payer: BC Managed Care – PPO | Admitting: Family Medicine

## 2020-07-17 ENCOUNTER — Encounter: Payer: Self-pay | Admitting: Family Medicine

## 2020-07-17 VITALS — Ht 69.49 in

## 2020-07-17 DIAGNOSIS — F1190 Opioid use, unspecified, uncomplicated: Secondary | ICD-10-CM

## 2020-07-17 DIAGNOSIS — M5136 Other intervertebral disc degeneration, lumbar region: Secondary | ICD-10-CM | POA: Diagnosis not present

## 2020-07-17 MED ORDER — HYDROMORPHONE HCL 8 MG PO TABS: 8.0000 mg | ORAL_TABLET | Freq: Every day | ORAL | 0 refills | Status: DC

## 2020-07-17 MED ORDER — OXYCODONE HCL 15 MG PO TABS
15.0000 mg | ORAL_TABLET | ORAL | 0 refills | Status: DC | PRN
Start: 2020-08-15 — End: 2020-07-17

## 2020-07-17 MED ORDER — HYDROMORPHONE HCL 8 MG PO TABS: 8.0000 mg | ORAL_TABLET | Freq: Every day | ORAL | 0 refills | Status: AC

## 2020-07-17 MED ORDER — AMPHETAMINE-DEXTROAMPHET ER 10 MG PO CP24: 10.0000 mg | ORAL_CAPSULE | Freq: Every day | ORAL | 0 refills | Status: DC

## 2020-07-17 MED ORDER — OXYCODONE HCL 15 MG PO TABS: 15.0000 mg | ORAL_TABLET | ORAL | 0 refills | Status: AC | PRN

## 2020-07-17 MED ORDER — OXYCODONE HCL 15 MG PO TABS: 15.0000 mg | ORAL_TABLET | ORAL | 0 refills | Status: DC | PRN

## 2020-07-17 NOTE — Progress Notes (Signed)
   Subjective:    Patient ID: [name], female    DOB: [birth year], 45 y.o.   MRN: [id number]  HPI Here for pain management. She is still struggling with low back pain. She saw Dr. [name] yesterday and he is sending her for more PT. She also asks if she can try an ADHD medication. Her therapist thinks she has this, and both of her children take Vyvanse for ADHD. She says her thoughts are scattered and she loses focus easily.  Indication for chronic opioid: back pain  Medication and dose: Oxycodone 15 mg and Dilaudid 8 mg # pills per month: 180 and 30 Last UDS date: 04-17-20 Opioid Treatment Agreement signed (Y/N): 04-17-19 Opioid Treatment Agreement last reviewed with patient:  07-17-20 [facility] reviewed this encounter (include red flags): Yes    Review of Systems     Objective:   Physical Exam        Assessment & Plan:  Pain management, meds were refilled. She will also try Adderall XR 10 mg each morning for ADHD. Gershon Crane, MD

## 2020-07-22 NOTE — Telephone Encounter (Signed)
Encounter opened in error

## 2020-07-23 DIAGNOSIS — U071 COVID-19: Secondary | ICD-10-CM | POA: Diagnosis not present

## 2020-07-29 ENCOUNTER — Other Ambulatory Visit: Payer: Self-pay | Admitting: Family Medicine

## 2020-08-15 ENCOUNTER — Encounter: Payer: Self-pay | Admitting: Family Medicine

## 2020-08-16 MED ORDER — AMPHETAMINE-DEXTROAMPHET ER 20 MG PO CP24: 20.0000 mg | ORAL_CAPSULE | ORAL | 0 refills | Status: DC

## 2020-08-16 NOTE — Telephone Encounter (Signed)
I increased the dose of the Adderall XR to 20 mg daily. I sent in a one month supply, so she can report back in a few weeks with how she is doing

## 2020-08-20 ENCOUNTER — Other Ambulatory Visit: Payer: Self-pay | Admitting: Family Medicine

## 2020-08-20 DIAGNOSIS — U071 COVID-19: Secondary | ICD-10-CM | POA: Diagnosis not present

## 2020-08-30 ENCOUNTER — Other Ambulatory Visit: Payer: Self-pay | Admitting: Family Medicine

## 2020-09-02 NOTE — Telephone Encounter (Signed)
Gabapentin has been discontinued on 11/20/2019  Last Xanax refill- 02/07/2020-90 tabs with 5 refills

## 2020-09-18 ENCOUNTER — Encounter: Payer: Self-pay | Admitting: Family Medicine

## 2020-09-18 MED ORDER — AMPHETAMINE-DEXTROAMPHET ER 25 MG PO CP24: 25.0000 mg | ORAL_CAPSULE | ORAL | 0 refills | Status: DC

## 2020-09-18 NOTE — Telephone Encounter (Signed)
I bumped this up to 25 mg. I sent in one month for her to try

## 2020-09-20 DIAGNOSIS — U071 COVID-19: Secondary | ICD-10-CM | POA: Diagnosis not present

## 2020-10-01 DIAGNOSIS — L650 Telogen effluvium: Secondary | ICD-10-CM | POA: Diagnosis not present

## 2020-10-01 DIAGNOSIS — L218 Other seborrheic dermatitis: Secondary | ICD-10-CM | POA: Diagnosis not present

## 2020-10-01 DIAGNOSIS — L74511 Primary focal hyperhidrosis, face: Secondary | ICD-10-CM | POA: Diagnosis not present

## 2020-10-06 ENCOUNTER — Telehealth: Payer: BC Managed Care – PPO | Admitting: Family

## 2020-10-06 DIAGNOSIS — R399 Unspecified symptoms and signs involving the genitourinary system: Secondary | ICD-10-CM

## 2020-10-06 MED ORDER — CEPHALEXIN 500 MG PO CAPS: 500.0000 mg | ORAL_CAPSULE | Freq: Two times a day (BID) | ORAL | 0 refills | Status: DC

## 2020-10-06 NOTE — Progress Notes (Signed)

## 2020-10-14 ENCOUNTER — Other Ambulatory Visit: Payer: Self-pay

## 2020-10-15 ENCOUNTER — Ambulatory Visit: Payer: BC Managed Care – PPO | Admitting: Family Medicine

## 2020-10-15 ENCOUNTER — Encounter: Payer: Self-pay | Admitting: Family Medicine

## 2020-10-15 VITALS — BP 142/98 | HR 107 | Temp 98.7°F | Wt 271.0 lb

## 2020-10-15 DIAGNOSIS — L568 Other specified acute skin changes due to ultraviolet radiation: Secondary | ICD-10-CM

## 2020-10-15 NOTE — Progress Notes (Signed)
   Subjective:    Patient ID: [name], female    DOB: April 18, 1976, 45 y.o.   MRN: [id number]  HPI Here for 4 days of an itchy rash on both forearms and both thighs. On 10-06-20 she had a video visit for a UTI, and she was treated with 7 days of Keflex 500 mg BID. Her UTI symptoms resolved promptly. She was in [place] at the time, visiting [employer]. She was out in the sun almost all day every day, and even though she used sun block, her skin turned red. No SOB or tongue swelling. She has been applying cortisone cream to the areas, and they are slowly clearing day by day.    Review of Systems  Constitutional: Negative.   Respiratory: Negative.   Cardiovascular: Negative.   Genitourinary: Negative.   Skin: Positive for rash.       Objective:   Physical Exam Constitutional:      General: She is not in acute distress.    Appearance: Normal appearance.  Cardiovascular:     Rate and Rhythm: Normal rate and regular rhythm.     Pulses: Normal pulses.     Heart sounds: Normal heart sounds.  Pulmonary:     Effort: Pulmonary effort is normal.     Breath sounds: Normal breath sounds.  Skin:    Comments: She has patches of erythema with a few small papules on the extensor surfaces of both forearms and on both anterior thighs.   Neurological:     Mental Status: She is alert.           Assessment & Plan:  This is a photosensitivity rash from being in intense sunlight and taking Keflex. This is resolving ,and she may continue to apply cortisone cream if she wants to. Avoid sunlight for now. Gershon Crane, MD

## 2020-10-18 ENCOUNTER — Encounter: Payer: Self-pay | Admitting: Family Medicine

## 2020-10-18 ENCOUNTER — Telehealth (INDEPENDENT_AMBULATORY_CARE_PROVIDER_SITE_OTHER): Payer: BC Managed Care – PPO | Admitting: Family Medicine

## 2020-10-18 DIAGNOSIS — M5136 Other intervertebral disc degeneration, lumbar region: Secondary | ICD-10-CM | POA: Diagnosis not present

## 2020-10-18 DIAGNOSIS — F1190 Opioid use, unspecified, uncomplicated: Secondary | ICD-10-CM

## 2020-10-18 MED ORDER — OXYCODONE HCL 15 MG PO TABS: 15.0000 mg | ORAL_TABLET | ORAL | 0 refills | Status: DC | PRN

## 2020-10-18 MED ORDER — HYDROMORPHONE HCL 8 MG PO TABS: 8.0000 mg | ORAL_TABLET | Freq: Every day | ORAL | 0 refills | Status: DC

## 2020-10-18 MED ORDER — HYDROMORPHONE HCL 8 MG PO TABS: 8.0000 mg | ORAL_TABLET | Freq: Every day | ORAL | 0 refills | Status: AC

## 2020-10-18 MED ORDER — OXYCODONE HCL 15 MG PO TABS: 15.0000 mg | ORAL_TABLET | ORAL | 0 refills | Status: AC | PRN

## 2020-10-18 NOTE — Progress Notes (Signed)
Subjective:    Patient ID: [name], female    DOB: January 17, 1976, 45 y.o.   MRN: [id number]  HPI Virtual Visit via Video Note  I connected with the patient on 10/18/20 at  2:00 PM EDT by a video enabled telemedicine application and verified that I am speaking with the correct person using two identifiers.  Location patient: home Location provider:work or home office Persons participating in the virtual visit: patient, provider  I discussed the limitations of evaluation and management by telemedicine and the availability of in person appointments. The patient expressed understanding and agreed to proceed.   HPI: Here for pain management, she is doing well.    ROS: See pertinent positives and negatives per HPI.  Past Medical History:  Diagnosis Date  . Allergy   . Anxiety   . Arthritis   . B12 deficiency   . Chronic narcotic use   . Depression    not currently   . Dysrhythmia    hx tachy-was from medication nortriptylline  . Esophagitis    rx  . Fibromyalgia   . GERD (gastroesophageal reflux disease)   . History of echocardiogram    Echo 4/18: Mild concentric LVH, vigorous LVF, EF 65-70, normal wall motion, grade 1 diastolic dysfunction  . History of kidney stones   . Hyperlipidemia   . Intervertebral disc protrusion 01/11/2014  . Migraine   . Obesity (BMI 35.0-39.9 without comorbidity)   . Peripheral neuropathy   . Polycystic ovaries   . Pulmonary embolus (HCC)   . Spinal stenosis, lumbar   . Vitamin D deficiency     Past Surgical History:  Procedure Laterality Date  . 24 HOUR PH STUDY N/A 05/27/2016   Procedure: 24 HOUR PH STUDY;  Surgeon: Ruffin Frederick, MD;  Location: [facility];  Service: Gastroenterology;  Laterality: N/A;  . ABDOMINAL EXPOSURE N/A 05/04/2018   Procedure: ABDOMINAL EXPOSURE;  Surgeon: Larina Earthly, MD;  Location: [facility] OR;  Service: Vascular;  Laterality: N/A;  . ANTERIOR LUMBAR FUSION N/A 05/04/2018   Procedure: Lumbar Five  Sacral One Anterior lumbar interbody fusion;  Surgeon: Donalee Citrin, MD;  Location: [facility] OR;  Service: Neurosurgery;  Laterality: N/A;  Lumbar Five Sacral One Anterior lumbar interbody fusion  . APPENDECTOMY    . BACK SURGERY  2012   left laminectomy at L3-4 per Dr. Phoebe Perch   . bladder tack  2012  . CHOLECYSTECTOMY N/A 10/03/2015   Procedure: LAPAROSCOPIC CHOLECYSTECTOMY;  Surgeon: Rodman Pickle, MD;  Location: [facility] OR;  Service: General;  Laterality: N/A;  . ESOPHAGEAL MANOMETRY N/A 05/27/2016   Procedure: ESOPHAGEAL MANOMETRY (EM);  Surgeon: Ruffin Frederick, MD;  Location: [facility];  Service: Gastroenterology;  Laterality: N/A;  . EXCISION OF SKIN TAG  10/03/2015   Procedure: EXCISION OF SKIN TAG;  Surgeon: De Blanch Kinsinger, MD;  Location: [facility] OR;  Service: General;;  . LUMBAR DISC SURGERY  01-31-14   per Dr. [name]   . OOPHORECTOMY     left overy  . OVARIAN CYST REMOVAL    . RECTOCELE REPAIR    . SINUSOTOMY  12/14/06  . spinal fusion  2016/10  . TONSILLECTOMY    . TONSILLECTOMY    . VAGINAL HYSTERECTOMY    . WRIST GANGLION EXCISION Left     Family History  Problem Relation Age of Onset  . Fibromyalgia Mother   . Diabetes Mother   . Thyroid cancer Mother   . Liver disease Mother   . Neuropathy  Mother   . Cirrhosis Mother        non-alcoholic  . Pulmonary embolism Mother        both lungs  . Heart failure Mother   . Hypertension Father   . Diabetes Father   . Heart disease Father   . Prostate cancer Father   . Heart attack Father        x 2  . Colon cancer Neg Hx   . Other Neg Hx        pheochromocytoma  . Colon polyps Neg Hx      Current Outpatient Medications:  .  ALPRAZolam (XANAX) 0.5 MG tablet, TAKE 1 TABLET BY MOUTH THREE TIMES A DAY AS NEEDED, Disp: 90 tablet, Rfl: 5 .  amphetamine-dextroamphetamine (ADDERALL XR) 25 MG 24 hr capsule, Take 1 capsule by mouth every morning., Disp: 30 capsule, Rfl: 0 .  B-D 3CC LUER-LOK SYR 25GX1" 25G X 1" 3 ML MISC, USE  AS DIRECTED, Disp: 9 each, Rfl: 2 .  cetirizine (ZYRTEC) 10 MG tablet, Take 10 mg by mouth daily., Disp: , Rfl:  .  clobetasol (TEMOVATE) 0.05 % external solution, clobetasol 0.05 % scalp solution, Disp: , Rfl:  .  CYANOCOBALAMIN IJ, Inject as directed once a week., Disp: , Rfl:  .  esomeprazole (NEXIUM) 40 MG capsule, TAKE 1 CAPSULE BY MOUTH EVERY DAY, Disp: 90 capsule, Rfl: 0 .  famotidine (PEPCID) 40 MG tablet, Take 1 tablet (40 mg total) by mouth at bedtime., Disp: 90 tablet, Rfl: 1 .  fluconazole (DIFLUCAN) 150 MG tablet, TAKE 1 TABLET BY MOUTH EVERY DAY, Disp: 5 tablet, Rfl: 5 .  Fluocinolone Acetonide Body 0.01 % OIL, fluocinolone 0.01 % scalp oil and shower cap, Disp: , Rfl:  .  fluticasone (FLONASE) 50 MCG/ACT nasal spray, Place 2 sprays into both nostrils daily., Disp: 16 g, Rfl: 6 .  furosemide (LASIX) 20 MG tablet, TAKE 2 TABLETS DAILY FOR 5 DAYS. DO NOT TAKE HYDROCHLOROTHIAZIDE WHILE TAKING LASIX, Disp: 90 tablet, Rfl: 1 .  gabapentin (NEURONTIN) 300 MG capsule, TAKE 1 CAPSULE BY MOUTH THREE TIMES A DAY, Disp: 270 capsule, Rfl: 1 .  hydrochlorothiazide (HYDRODIURIL) 25 MG tablet, TAKE 1 TABLET BY MOUTH EVERY DAY, Disp: 30 tablet, Rfl: 5 .  Multiple Vitamins-Minerals (MULTI-VITAMIN GUMMIES PO), Take 2 tablets by mouth daily., Disp: , Rfl:  .  promethazine (PHENERGAN) 25 MG tablet, TAKE 1 TABLET BY MOUTH EVERY 6 HOURS AS NEEDED FOR NAUSEA (Patient taking differently: Take 25 mg by mouth every 6 (six) hours as needed for nausea or vomiting.), Disp: 90 tablet, Rfl: 1 .  sucralfate (CARAFATE) 1 g tablet, Take 1 tablet (1 g total) by mouth 4 (four) times daily. (Patient taking differently: Take 1 g by mouth daily.), Disp: 120 tablet, Rfl: 2 .  SUMAtriptan (IMITREX) 100 MG tablet, Take 1 tablet earliest onset of migraine.  May repeat once in 2 hours if headache persists or recurs. (Patient taking differently: Take 100 mg by mouth every 2 (two) hours as needed for migraine.), Disp: 10 tablet,  Rfl: 2 .  Vitamin D, Ergocalciferol, (DRISDOL) 1.25 MG (50000 UNIT) CAPS capsule, TAKE 1 CAPSULE (50,000 UNITS TOTAL) BY MOUTH EVERY 7 (SEVEN) DAYS., Disp: 12 capsule, Rfl: 3 .  benzonatate (TESSALON PERLES) 100 MG capsule, Take 1 capsule (100 mg total) by mouth 3 (three) times daily as needed. (Patient not taking: Reported on 10/18/2020), Disp: 20 capsule, Rfl: 0  EXAM:  VITALS per patient if applicable:  GENERAL: alert, oriented,  appears well and in no acute distress  HEENT: atraumatic, conjunttiva clear, no obvious abnormalities on inspection of external nose and ears  NECK: normal movements of the head and neck  LUNGS: on inspection no signs of respiratory distress, breathing rate appears normal, no obvious gross SOB, gasping or wheezing  CV: no obvious cyanosis  MS: moves all visible extremities without noticeable abnormality  PSYCH/NEURO: pleasant and cooperative, no obvious depression or anxiety, speech and thought processing grossly intact  ASSESSMENT AND PLAN: Pain management. Indication for chronic opioid: low back pain Medication and dose: Oxycodone 15 mg and Dilaudid 8 mg # pills per month: 180 and 30 Last UDS date: 04-17-20 Opioid Treatment Agreement signed (Y/N): 04-17-19 Opioid Treatment Agreement last reviewed with patient:  10-18-20 [facility] reviewed this encounter (include red flags):  10-18-20 Meds were refilled.  Gershon Crane, MD   Discussed the following assessment and plan:  No diagnosis found.     I discussed the assessment and treatment plan with the patient. The patient was provided an opportunity to ask questions and all were answered. The patient agreed with the plan and demonstrated an understanding of the instructions.   The patient was advised to call back or seek an in-person evaluation if the symptoms worsen or if the condition fails to improve as anticipated.     Review of Systems     Objective:   Physical Exam        Assessment &  Plan:

## 2020-10-20 DIAGNOSIS — U071 COVID-19: Secondary | ICD-10-CM | POA: Diagnosis not present

## 2020-10-22 MED ORDER — AMPHETAMINE-DEXTROAMPHET ER 25 MG PO CP24: 25.0000 mg | ORAL_CAPSULE | ORAL | 0 refills | Status: DC

## 2020-10-22 NOTE — Telephone Encounter (Signed)
I guess I forgot to do this. I just sent them in

## 2020-10-24 DIAGNOSIS — Z1630 Resistance to unspecified antimicrobial drugs: Secondary | ICD-10-CM | POA: Diagnosis not present

## 2020-10-24 DIAGNOSIS — F3289 Other specified depressive episodes: Secondary | ICD-10-CM | POA: Diagnosis not present

## 2020-10-25 ENCOUNTER — Other Ambulatory Visit: Payer: Self-pay | Admitting: Family Medicine

## 2020-10-27 ENCOUNTER — Other Ambulatory Visit: Payer: Self-pay | Admitting: Family Medicine

## 2020-10-29 NOTE — Telephone Encounter (Signed)
Hi, patient will need appointment or check with PCP to see if appropriate. Thanks.

## 2020-10-31 DIAGNOSIS — N760 Acute vaginitis: Secondary | ICD-10-CM | POA: Diagnosis not present

## 2020-10-31 DIAGNOSIS — Z113 Encounter for screening for infections with a predominantly sexual mode of transmission: Secondary | ICD-10-CM | POA: Diagnosis not present

## 2020-10-31 DIAGNOSIS — R39198 Other difficulties with micturition: Secondary | ICD-10-CM | POA: Diagnosis not present

## 2020-10-31 DIAGNOSIS — Z87440 Personal history of urinary (tract) infections: Secondary | ICD-10-CM | POA: Diagnosis not present

## 2020-11-20 ENCOUNTER — Ambulatory Visit (INDEPENDENT_AMBULATORY_CARE_PROVIDER_SITE_OTHER): Payer: BC Managed Care – PPO | Admitting: Psychology

## 2020-11-20 DIAGNOSIS — F329 Major depressive disorder, single episode, unspecified: Secondary | ICD-10-CM

## 2020-11-20 DIAGNOSIS — U071 COVID-19: Secondary | ICD-10-CM | POA: Diagnosis not present

## 2020-11-20 DIAGNOSIS — F411 Generalized anxiety disorder: Secondary | ICD-10-CM | POA: Diagnosis not present

## 2020-11-27 ENCOUNTER — Ambulatory Visit (INDEPENDENT_AMBULATORY_CARE_PROVIDER_SITE_OTHER): Payer: BC Managed Care – PPO | Admitting: Psychology

## 2020-11-27 DIAGNOSIS — F331 Major depressive disorder, recurrent, moderate: Secondary | ICD-10-CM | POA: Diagnosis not present

## 2020-12-11 ENCOUNTER — Ambulatory Visit (INDEPENDENT_AMBULATORY_CARE_PROVIDER_SITE_OTHER): Payer: BC Managed Care – PPO | Admitting: Psychology

## 2020-12-11 DIAGNOSIS — F331 Major depressive disorder, recurrent, moderate: Secondary | ICD-10-CM

## 2020-12-18 ENCOUNTER — Ambulatory Visit (INDEPENDENT_AMBULATORY_CARE_PROVIDER_SITE_OTHER): Payer: BC Managed Care – PPO | Admitting: Psychology

## 2020-12-18 DIAGNOSIS — F331 Major depressive disorder, recurrent, moderate: Secondary | ICD-10-CM | POA: Diagnosis not present

## 2020-12-20 DIAGNOSIS — U071 COVID-19: Secondary | ICD-10-CM | POA: Diagnosis not present

## 2020-12-21 ENCOUNTER — Other Ambulatory Visit: Payer: Self-pay | Admitting: Family Medicine

## 2020-12-23 ENCOUNTER — Ambulatory Visit: Payer: BC Managed Care – PPO | Admitting: Psychology

## 2020-12-26 ENCOUNTER — Ambulatory Visit (INDEPENDENT_AMBULATORY_CARE_PROVIDER_SITE_OTHER): Payer: BC Managed Care – PPO | Admitting: Psychology

## 2020-12-26 DIAGNOSIS — F331 Major depressive disorder, recurrent, moderate: Secondary | ICD-10-CM | POA: Diagnosis not present

## 2021-01-02 ENCOUNTER — Ambulatory Visit (INDEPENDENT_AMBULATORY_CARE_PROVIDER_SITE_OTHER): Payer: BC Managed Care – PPO | Admitting: Psychology

## 2021-01-02 DIAGNOSIS — F331 Major depressive disorder, recurrent, moderate: Secondary | ICD-10-CM | POA: Diagnosis not present

## 2021-01-09 ENCOUNTER — Ambulatory Visit (INDEPENDENT_AMBULATORY_CARE_PROVIDER_SITE_OTHER): Payer: BC Managed Care – PPO | Admitting: Psychology

## 2021-01-09 DIAGNOSIS — F331 Major depressive disorder, recurrent, moderate: Secondary | ICD-10-CM

## 2021-01-15 ENCOUNTER — Ambulatory Visit: Payer: BC Managed Care – PPO | Admitting: Psychology

## 2021-01-17 ENCOUNTER — Ambulatory Visit (INDEPENDENT_AMBULATORY_CARE_PROVIDER_SITE_OTHER): Payer: BC Managed Care – PPO | Admitting: Psychology

## 2021-01-17 DIAGNOSIS — F331 Major depressive disorder, recurrent, moderate: Secondary | ICD-10-CM | POA: Diagnosis not present

## 2021-01-20 DIAGNOSIS — U071 COVID-19: Secondary | ICD-10-CM | POA: Diagnosis not present

## 2021-01-21 ENCOUNTER — Telehealth (INDEPENDENT_AMBULATORY_CARE_PROVIDER_SITE_OTHER): Payer: BC Managed Care – PPO | Admitting: Family Medicine

## 2021-01-21 ENCOUNTER — Encounter: Payer: Self-pay | Admitting: Family Medicine

## 2021-01-21 DIAGNOSIS — M5136 Other intervertebral disc degeneration, lumbar region: Secondary | ICD-10-CM | POA: Diagnosis not present

## 2021-01-21 DIAGNOSIS — F1190 Opioid use, unspecified, uncomplicated: Secondary | ICD-10-CM

## 2021-01-21 MED ORDER — VITAMIN D (ERGOCALCIFEROL) 1.25 MG (50000 UNIT) PO CAPS: 50000.0000 [IU] | ORAL_CAPSULE | ORAL | 3 refills | Status: DC

## 2021-01-21 MED ORDER — HYDROMORPHONE HCL 8 MG PO TABS: 8.0000 mg | ORAL_TABLET | Freq: Every day | ORAL | 0 refills | Status: AC | PRN

## 2021-01-21 MED ORDER — HYDROMORPHONE HCL 8 MG PO TABS: 8.0000 mg | ORAL_TABLET | Freq: Every day | ORAL | 0 refills | Status: DC | PRN

## 2021-01-21 MED ORDER — AMPHETAMINE-DEXTROAMPHET ER 30 MG PO CP24: 30.0000 mg | ORAL_CAPSULE | ORAL | 0 refills | Status: DC

## 2021-01-21 MED ORDER — OXYCODONE HCL 15 MG PO TABS: 15.0000 mg | ORAL_TABLET | ORAL | 0 refills | Status: DC | PRN

## 2021-01-21 MED ORDER — OXYCODONE HCL 15 MG PO TABS: 15.0000 mg | ORAL_TABLET | ORAL | 0 refills | Status: AC | PRN

## 2021-01-21 NOTE — Progress Notes (Signed)
Subjective:    Patient ID: [name], female    DOB: 1975-07-10, 45 y.o.   MRN: [id number]  HPI Virtual Visit via Video Note  I connected with the patient on 01/21/21 at  2:30 PM EDT by a video enabled telemedicine application and verified that I am speaking with the correct person using two identifiers.  Location patient: home Location provider:work or home office Persons participating in the virtual visit: patient, provider  I discussed the limitations of evaluation and management by telemedicine and the availability of in person appointments. The patient expressed understanding and agreed to proceed.   HPI: Here for pain management. She is doing well.    ROS: See pertinent positives and negatives per HPI.  Past Medical History:  Diagnosis Date   Allergy    Anxiety    Arthritis    B12 deficiency    Chronic narcotic use    Depression    not currently    Dysrhythmia    hx tachy-was from medication nortriptylline   Esophagitis    rx   Fibromyalgia    GERD (gastroesophageal reflux disease)    History of echocardiogram    Echo 4/18: Mild concentric LVH, vigorous LVF, EF 65-70, normal wall motion, grade 1 diastolic dysfunction   History of kidney stones    Hyperlipidemia    Intervertebral disc protrusion 01/11/2014   Migraine    Obesity (BMI 35.0-39.9 without comorbidity)    Peripheral neuropathy    Polycystic ovaries    Pulmonary embolus (HCC)    Spinal stenosis, lumbar    Vitamin D deficiency     Past Surgical History:  Procedure Laterality Date   71 HOUR PH STUDY N/A 05/27/2016   Procedure: 24 HOUR PH STUDY;  Surgeon: Ruffin Frederick, MD;  Location: [facility];  Service: Gastroenterology;  Laterality: N/A;   ABDOMINAL EXPOSURE N/A 05/04/2018   Procedure: ABDOMINAL EXPOSURE;  Surgeon: Larina Earthly, MD;  Location: [facility] OR;  Service: Vascular;  Laterality: N/A;   ANTERIOR LUMBAR FUSION N/A 05/04/2018   Procedure: Lumbar Five Sacral One Anterior lumbar  interbody fusion;  Surgeon: Donalee Citrin, MD;  Location: [facility] OR;  Service: Neurosurgery;  Laterality: N/A;  Lumbar Five Sacral One Anterior lumbar interbody fusion   APPENDECTOMY     BACK SURGERY  2012   left laminectomy at L3-4 per Dr. Phoebe Perch    bladder tack  2012   CHOLECYSTECTOMY N/A 10/03/2015   Procedure: LAPAROSCOPIC CHOLECYSTECTOMY;  Surgeon: Rodman Pickle, MD;  Location: [facility] OR;  Service: General;  Laterality: N/A;   ESOPHAGEAL MANOMETRY N/A 05/27/2016   Procedure: ESOPHAGEAL MANOMETRY (EM);  Surgeon: Ruffin Frederick, MD;  Location: [facility];  Service: Gastroenterology;  Laterality: N/A;   EXCISION OF SKIN TAG  10/03/2015   Procedure: EXCISION OF SKIN TAG;  Surgeon: De Blanch Kinsinger, MD;  Location: [facility] OR;  Service: General;;   LUMBAR DISC SURGERY  01-31-14   per Dr. [name]    OOPHORECTOMY     left overy   OVARIAN CYST REMOVAL     RECTOCELE REPAIR     SINUSOTOMY  12/14/06   spinal fusion  2016/10   TONSILLECTOMY     TONSILLECTOMY     VAGINAL HYSTERECTOMY     WRIST GANGLION EXCISION Left     Family History  Problem Relation Age of Onset   Fibromyalgia Mother    Diabetes Mother    Thyroid cancer Mother    Liver disease Mother    Neuropathy  Mother    Cirrhosis Mother        non-alcoholic   Pulmonary embolism Mother        both lungs   Heart failure Mother    Hypertension Father    Diabetes Father    Heart disease Father    Prostate cancer Father    Heart attack Father        x 2   Colon cancer Neg Hx    Other Neg Hx        pheochromocytoma   Colon polyps Neg Hx      Current Outpatient Medications:    ALPRAZolam (XANAX) 0.5 MG tablet, TAKE 1 TABLET BY MOUTH THREE TIMES A DAY AS NEEDED, Disp: 90 tablet, Rfl: 5   B-D 3CC LUER-LOK SYR 25GX1" 25G X 1" 3 ML MISC, USE AS DIRECTED, Disp: 9 each, Rfl: 2   cetirizine (ZYRTEC) 10 MG tablet, Take 10 mg by mouth daily., Disp: , Rfl:    clobetasol (TEMOVATE) 0.05 % external solution, clobetasol 0.05 % scalp  solution, Disp: , Rfl:    CYANOCOBALAMIN IJ, Inject as directed once a week., Disp: , Rfl:    esomeprazole (NEXIUM) 40 MG capsule, TAKE 1 CAPSULE BY MOUTH EVERY DAY, Disp: 90 capsule, Rfl: 0   famotidine (PEPCID) 40 MG tablet, Take 1 tablet (40 mg total) by mouth at bedtime., Disp: 90 tablet, Rfl: 1   fluconazole (DIFLUCAN) 150 MG tablet, TAKE 1 TABLET BY MOUTH EVERY DAY, Disp: 5 tablet, Rfl: 5   Fluocinolone Acetonide Body 0.01 % OIL, fluocinolone 0.01 % scalp oil and shower cap, Disp: , Rfl:    fluticasone (FLONASE) 50 MCG/ACT nasal spray, Place 2 sprays into both nostrils daily., Disp: 16 g, Rfl: 6   furosemide (LASIX) 20 MG tablet, TAKE 2 TABLETS DAILY FOR 5 DAYS. DO NOT TAKE HYDROCHLOROTHIAZIDE WHILE TAKING LASIX, Disp: 90 tablet, Rfl: 1   gabapentin (NEURONTIN) 300 MG capsule, TAKE 1 CAPSULE BY MOUTH THREE TIMES A DAY, Disp: 270 capsule, Rfl: 1   hydrochlorothiazide (HYDRODIURIL) 25 MG tablet, TAKE 1 TABLET BY MOUTH EVERY DAY, Disp: 30 tablet, Rfl: 5   Multiple Vitamins-Minerals (MULTI-VITAMIN GUMMIES PO), Take 2 tablets by mouth daily., Disp: , Rfl:    promethazine (PHENERGAN) 25 MG tablet, TAKE 1 TABLET BY MOUTH EVERY 6 HOURS AS NEEDED FOR NAUSEA (Patient taking differently: Take 25 mg by mouth every 6 (six) hours as needed for nausea or vomiting.), Disp: 90 tablet, Rfl: 1   sucralfate (CARAFATE) 1 g tablet, Take 1 tablet (1 g total) by mouth 4 (four) times daily. (Patient taking differently: Take 1 g by mouth daily.), Disp: 120 tablet, Rfl: 2   SUMAtriptan (IMITREX) 100 MG tablet, Take 1 tablet earliest onset of migraine.  May repeat once in 2 hours if headache persists or recurs. (Patient taking differently: Take 100 mg by mouth every 2 (two) hours as needed for migraine.), Disp: 10 tablet, Rfl: 2   [START ON 03/23/2021] amphetamine-dextroamphetamine (ADDERALL XR) 30 MG 24 hr capsule, Take 1 capsule (30 mg total) by mouth every morning., Disp: 30 capsule, Rfl: 0   [START ON 03/23/2021]  HYDROmorphone (DILAUDID) 8 MG tablet, Take 1 tablet (8 mg total) by mouth daily as needed for severe pain., Disp: 30 tablet, Rfl: 0   [START ON 03/23/2021] oxyCODONE (ROXICODONE) 15 MG immediate release tablet, Take 1 tablet (15 mg total) by mouth every 4 (four) hours as needed for pain., Disp: 180 tablet, Rfl: 0   Vitamin D, Ergocalciferol, (DRISDOL)  1.25 MG (50000 UNIT) CAPS capsule, Take 1 capsule (50,000 Units total) by mouth every 7 (seven) days., Disp: 12 capsule, Rfl: 3  EXAM:  VITALS per patient if applicable:  GENERAL: alert, oriented, appears well and in no acute distress  HEENT: atraumatic, conjunttiva clear, no obvious abnormalities on inspection of external nose and ears  NECK: normal movements of the head and neck  LUNGS: on inspection no signs of respiratory distress, breathing rate appears normal, no obvious gross SOB, gasping or wheezing  CV: no obvious cyanosis  MS: moves all visible extremities without noticeable abnormality  PSYCH/NEURO: pleasant and cooperative, no obvious depression or anxiety, speech and thought processing grossly intact  ASSESSMENT AND PLAN: Pain management. Indication for chronic opioid: low back pain Medication and dose: Oxycodone 15 mg and Dilaudid 8 mg  # pills per month: 180 and 30 Last UDS date: 04-17-20 Opioid Treatment Agreement signed (Y/N): 04-17-19 Opioid Treatment Agreement last reviewed with patient:  01-21-21 [facility] reviewed this encounter (include red flags): Yes Meds were refilled.  Gershon Crane, MD  Discussed the following assessment and plan:  No diagnosis found.     I discussed the assessment and treatment plan with the patient. The patient was provided an opportunity to ask questions and all were answered. The patient agreed with the plan and demonstrated an understanding of the instructions.   The patient was advised to call back or seek an in-person evaluation if the symptoms worsen or if the condition fails to  improve as anticipated.      Review of Systems     Objective:   Physical Exam        Assessment & Plan:

## 2021-01-22 ENCOUNTER — Ambulatory Visit (INDEPENDENT_AMBULATORY_CARE_PROVIDER_SITE_OTHER): Payer: BC Managed Care – PPO | Admitting: Psychology

## 2021-01-22 DIAGNOSIS — F331 Major depressive disorder, recurrent, moderate: Secondary | ICD-10-CM | POA: Diagnosis not present

## 2021-01-23 ENCOUNTER — Ambulatory Visit (INDEPENDENT_AMBULATORY_CARE_PROVIDER_SITE_OTHER): Payer: BC Managed Care – PPO | Admitting: Psychology

## 2021-01-23 DIAGNOSIS — F331 Major depressive disorder, recurrent, moderate: Secondary | ICD-10-CM | POA: Diagnosis not present

## 2021-01-25 ENCOUNTER — Other Ambulatory Visit: Payer: Self-pay | Admitting: Family Medicine

## 2021-01-29 ENCOUNTER — Ambulatory Visit: Payer: BC Managed Care – PPO | Admitting: Psychology

## 2021-01-30 ENCOUNTER — Ambulatory Visit (INDEPENDENT_AMBULATORY_CARE_PROVIDER_SITE_OTHER): Payer: BC Managed Care – PPO | Admitting: Psychology

## 2021-01-30 DIAGNOSIS — F331 Major depressive disorder, recurrent, moderate: Secondary | ICD-10-CM

## 2021-02-06 ENCOUNTER — Ambulatory Visit (INDEPENDENT_AMBULATORY_CARE_PROVIDER_SITE_OTHER): Payer: BC Managed Care – PPO | Admitting: Psychology

## 2021-02-06 DIAGNOSIS — F331 Major depressive disorder, recurrent, moderate: Secondary | ICD-10-CM | POA: Diagnosis not present

## 2021-02-12 ENCOUNTER — Ambulatory Visit (INDEPENDENT_AMBULATORY_CARE_PROVIDER_SITE_OTHER): Payer: BC Managed Care – PPO | Admitting: Psychology

## 2021-02-12 DIAGNOSIS — F331 Major depressive disorder, recurrent, moderate: Secondary | ICD-10-CM | POA: Diagnosis not present

## 2021-02-18 ENCOUNTER — Other Ambulatory Visit: Payer: Self-pay | Admitting: Family Medicine

## 2021-02-19 ENCOUNTER — Ambulatory Visit (INDEPENDENT_AMBULATORY_CARE_PROVIDER_SITE_OTHER): Payer: BC Managed Care – PPO | Admitting: Psychology

## 2021-02-19 DIAGNOSIS — F331 Major depressive disorder, recurrent, moderate: Secondary | ICD-10-CM | POA: Diagnosis not present

## 2021-02-20 DIAGNOSIS — U071 COVID-19: Secondary | ICD-10-CM | POA: Diagnosis not present

## 2021-02-22 ENCOUNTER — Other Ambulatory Visit: Payer: Self-pay

## 2021-02-22 ENCOUNTER — Encounter (HOSPITAL_COMMUNITY): Payer: Self-pay | Admitting: Emergency Medicine

## 2021-02-22 ENCOUNTER — Ambulatory Visit (HOSPITAL_COMMUNITY)
Admission: EM | Admit: 2021-02-22 | Discharge: 2021-02-22 | Disposition: A | Discharge status: Home / Self Care | Payer: BC Managed Care – PPO | Attending: Student | Admitting: Student

## 2021-02-22 DIAGNOSIS — N760 Acute vaginitis: Secondary | ICD-10-CM | POA: Insufficient documentation

## 2021-02-22 LAB — POCT URINALYSIS DIPSTICK, ED / UC
Bilirubin Urine: NEGATIVE
Glucose, UA: NEGATIVE mg/dL
Hgb urine dipstick: NEGATIVE
Ketones, ur: NEGATIVE mg/dL
Leukocytes,Ua: NEGATIVE
Nitrite: NEGATIVE
Protein, ur: NEGATIVE mg/dL
Specific Gravity, Urine: 1.02 (ref 1.005–1.030)
Urobilinogen, UA: 0.2 mg/dL (ref 0.0–1.0)
pH: 6.5 (ref 5.0–8.0)

## 2021-02-22 MED ORDER — METRONIDAZOLE 500 MG PO TABS: 500.0000 mg | ORAL_TABLET | Freq: Two times a day (BID) | ORAL | 0 refills | Status: DC

## 2021-02-22 MED ORDER — FLUCONAZOLE 150 MG PO TABS: 150.0000 mg | ORAL_TABLET | Freq: Every day | ORAL | 0 refills | Status: DC

## 2021-02-22 NOTE — ED Triage Notes (Signed)
Pt c/o vaginal pain and irritation for week. Reports warm and painful to touch. Denies dysuria, discharge or drainage.

## 2021-02-22 NOTE — ED Provider Notes (Signed)
[facility]    CSN: [id number] Arrival date & time: 02/22/21  1437      History   Chief Complaint Chief Complaint  Patient presents with   Vaginal Pain    HPI Joss P Zeigler is a 45 y.o. female presenting with 1 week of external vaginal irritation.  Medical history extensive as below.  Patient states that she has had external vaginal irritation and redness for about a week.  Scant clear discharge and small amount of blood in the discharge, which is unusual for her as she has had a hysterectomy.  States that she went on vacation with her husband and has been using the terrible hotel toilet paper, also had sex 6 out of 7 days last week.  Also notes that she used her husband's razor to shave her bikini area, and typically takes boric acid to prevent yeast but forgot this while she was on her trip.  States she typically gets a UTI after sex so since she has been taking Macrobid 1 tablet after sex each day.  Also states that she gets yeast infections frequently and so took 3 Diflucan last week.  States typically takes 5 Diflucan to cure her yeast infections.  Denies urinary symptoms like abdominal pain, flank pain, fever/chills, dysuria, hematuria.  Denies STI risk.  HPI  Past Medical History:  Diagnosis Date   Allergy    Anxiety    Arthritis    B12 deficiency    Chronic narcotic use    Depression    not currently    Dysrhythmia    hx tachy-was from medication nortriptylline   Esophagitis    rx   Fibromyalgia    GERD (gastroesophageal reflux disease)    History of echocardiogram    Echo 4/18: Mild concentric LVH, vigorous LVF, EF 65-70, normal wall motion, grade 1 diastolic dysfunction   History of kidney stones    Hyperlipidemia    Intervertebral disc protrusion 01/11/2014   Migraine    Obesity (BMI 35.0-39.9 without comorbidity)    Peripheral neuropathy    Polycystic ovaries    Pulmonary embolus (HCC)    Spinal stenosis, lumbar    Vitamin D deficiency      Patient Active Problem List   Diagnosis Date Noted   Eczema 09/27/2019   Pneumonia due to COVID-19 virus 09/12/2019   Chronic pain 08/28/2019   Major depressive disorder with single episode, 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   Supraventricular tachycardia (HCC) 07/03/2019   Hypercoagulable state (HCC) 07/03/2019   Atypical chest pain 07/01/2019   Precordial pain 07/01/2019   Increased band cell count 07/01/2019   Fever 07/01/2019   Fibromyalgia 04/10/2019   Migraine 06/07/2018   Pulmonary embolism (HCC) 06/06/2018   DDD (degenerative disc disease), lumbar 05/04/2018   Spinal stenosis of lumbar region 04/01/2015   Chronic migraine without aura without status migrainosus, not intractable 03/04/2015   Alopecia 05/21/2014   Sciatica 01/11/2014   Intervertebral disc protrusion 01/11/2014   HTN (hypertension) 01/04/2013   Hemorrhoids 04/25/2012   Urinary retention 12/11/2010   Cerumen impaction 01/28/2010   Depression 04/08/2009   Myalgia 10/11/2008   Obesity (BMI 30-39.9) 08/10/2008   ACNE VULGARIS 08/10/2008   Morbid obesity (HCC) 08/10/2008   INSOMNIA 06/20/2008   B12 deficiency 03/23/2008   Hereditary and idiopathic peripheral neuropathy 02/27/2008   ANXIETY DISORDER, GENERALIZED 01/04/2007   GERD (gastroesophageal reflux disease) 01/04/2007   DEGENERATIVE JOINT DISEASE, KNEES, BILATERAL 01/04/2007   RENAL CALCULUS, HX OF 01/04/2007  Past Surgical History:  Procedure Laterality Date   41 HOUR PH STUDY N/A 05/27/2016   Procedure: 24 HOUR PH STUDY;  Surgeon: Ruffin Frederick, MD;  Location: [facility];  Service: Gastroenterology;  Laterality: N/A;   ABDOMINAL EXPOSURE N/A 05/04/2018   Procedure: ABDOMINAL EXPOSURE;  Surgeon: Larina Earthly, MD;  Location: [facility] OR;  Service: Vascular;  Laterality: N/A;   ANTERIOR LUMBAR FUSION N/A 05/04/2018   Procedure: Lumbar Five Sacral One Anterior lumbar interbody fusion;  Surgeon: Donalee Citrin, MD;  Location: [facility] OR;   Service: Neurosurgery;  Laterality: N/A;  Lumbar Five Sacral One Anterior lumbar interbody fusion   APPENDECTOMY     BACK SURGERY  2012   left laminectomy at L3-4 per Dr. Phoebe Perch    bladder tack  2012   CHOLECYSTECTOMY N/A 10/03/2015   Procedure: LAPAROSCOPIC CHOLECYSTECTOMY;  Surgeon: Rodman Pickle, MD;  Location: [facility] OR;  Service: General;  Laterality: N/A;   ESOPHAGEAL MANOMETRY N/A 05/27/2016   Procedure: ESOPHAGEAL MANOMETRY (EM);  Surgeon: Ruffin Frederick, MD;  Location: [facility];  Service: Gastroenterology;  Laterality: N/A;   EXCISION OF SKIN TAG  10/03/2015   Procedure: EXCISION OF SKIN TAG;  Surgeon: De Blanch Kinsinger, MD;  Location: [facility] OR;  Service: General;;   LUMBAR DISC SURGERY  01-31-14   per Dr. [name]    OOPHORECTOMY     left overy   OVARIAN CYST REMOVAL     RECTOCELE REPAIR     SINUSOTOMY  12/14/06   spinal fusion  2016/10   TONSILLECTOMY     TONSILLECTOMY     VAGINAL HYSTERECTOMY     WRIST GANGLION EXCISION Left     OB History     Gravida  2   Para  2   Term      Preterm  2   AB  0   Living  2      SAB      IAB      Ectopic      Multiple      Live Births  2            Home Medications    Prior to Admission medications   Medication Sig Start Date End Date Taking? Authorizing Provider  fluconazole (DIFLUCAN) 150 MG tablet Take 1 tablet (150 mg total) by mouth daily. -For your yeast infection, start the Diflucan (fluconazole)- Take one pill today (day 1). If you're still having symptoms in 3 days, take the second pill. 02/22/21  Yes Rhys Martini, PA-C  metroNIDAZOLE (FLAGYL) 500 MG tablet Take 1 tablet (500 mg total) by mouth 2 (two) times daily. Avoid alcohol while taking this medication and for 2 days after 02/22/21  Yes Cheree Ditto, Lyman Speller, PA-C  ALPRAZolam [name]) 0.5 MG tablet TAKE 1 TABLET BY MOUTH THREE TIMES A DAY AS NEEDED 09/03/20   [name], MD  amphetamine-dextroamphetamine (ADDERALL XR) 30 MG 24 hr capsule Take 1  capsule (30 mg total) by mouth every morning. 03/23/21 04/22/21  [name], MD  B-D 3CC LUER-LOK SYR 25GX1" 25G X 1" 3 ML MISC USE AS DIRECTED 06/10/20   [name], MD  cetirizine (ZYRTEC) 10 MG tablet Take 10 mg by mouth daily.    [provider]  clobetasol (TEMOVATE) 0.05 % external solution clobetasol 0.05 % scalp solution    [provider]  CYANOCOBALAMIN IJ Inject as directed once a week.    [provider]  esomeprazole (NEXIUM) 40  MG capsule TAKE 1 CAPSULE BY MOUTH EVERY DAY 01/27/21   [name], MD  famotidine (PEPCID) 40 MG tablet Take 1 tablet (40 mg total) by mouth at bedtime. 03/16/20   [name], MD  Fluocinolone Acetonide Body 0.01 % OIL fluocinolone 0.01 % scalp oil and shower cap    [provider]  fluticasone (FLONASE) 50 MCG/ACT nasal spray Place 2 sprays into both nostrils daily. 07/13/20   [name], [name], FNP  furosemide (LASIX) 20 MG tablet TAKE 2 TABLETS DAILY FOR 5 DAYS. DO NOT TAKE HYDROCHLOROTHIAZIDE WHILE TAKING LASIX 12/18/19   [name], MD  gabapentin (NEURONTIN) 300 MG capsule TAKE 1 CAPSULE BY MOUTH THREE TIMES A DAY 09/03/20   [name], MD  hydrochlorothiazide (HYDRODIURIL) 25 MG tablet TAKE 1 TABLET BY MOUTH EVERY DAY 02/18/21   [name], MD  HYDROmorphone (DILAUDID) 8 MG tablet Take 1 tablet (8 mg total) by mouth daily as needed for severe pain. 03/23/21 04/22/21  [name], MD  Multiple Vitamins-Minerals (MULTI-VITAMIN GUMMIES PO) Take 2 tablets by mouth daily.    [provider]  oxyCODONE (ROXICODONE) 15 MG immediate release tablet Take 1 tablet (15 mg total) by mouth every 4 (four) hours as needed for pain. 03/23/21 04/22/21  [name], MD  promethazine (PHENERGAN) 25 MG tablet TAKE 1 TABLET BY MOUTH EVERY 6 HOURS AS NEEDED FOR NAUSEA Patient taking differently: Take 25 mg by mouth every 6 (six) hours as needed for nausea or vomiting. 02/19/17   [name], MD   sucralfate (CARAFATE) 1 g tablet Take 1 tablet (1 g total) by mouth 4 (four) times daily. Patient taking differently: Take 1 g by mouth daily. 06/08/19   [name], MD  SUMAtriptan (IMITREX) 100 MG tablet Take 1 tablet earliest onset of migraine.  May repeat once in 2 hours if headache persists or recurs. Patient taking differently: Take 100 mg by mouth every 2 (two) hours as needed for migraine. 10/14/16   [name], DO  Vitamin D, Ergocalciferol, (DRISDOL) 1.25 MG (50000 UNIT) CAPS capsule Take 1 capsule (50,000 Units total) by mouth every 7 (seven) days. 01/21/21   [name], MD    Family History Family History  Problem Relation Age of Onset   Fibromyalgia Mother    Diabetes Mother    Thyroid cancer Mother    Liver disease Mother    Neuropathy Mother    Cirrhosis Mother        non-alcoholic   Pulmonary embolism Mother        both lungs   Heart failure Mother    Hypertension Father    Diabetes Father    Heart disease Father    Prostate cancer Father    Heart attack Father        x 2   Colon cancer Neg Hx    Other Neg Hx        pheochromocytoma   Colon polyps Neg Hx     Social History Social History   Tobacco Use   Smoking status: Former    Types: Cigarettes    Quit date: 1999    Years since quitting: 23.7   Smokeless tobacco: Never  Vaping Use   Vaping Use: Never used  Substance Use Topics   Alcohol use: No    Alcohol/week: 0.0 standard drinks   Drug use: No     Allergies   Gadolinium derivatives, Glycopyrrolate, Keflex [cephalexin], Other, and Zoloft [  sertraline hcl]   Review of Systems Review of Systems  Constitutional:  Negative for appetite change, chills, diaphoresis and fever.  Respiratory:  Negative for shortness of breath.   Cardiovascular:  Negative for chest pain.  Gastrointestinal:  Negative for abdominal pain, blood in stool, constipation, diarrhea, nausea and vomiting.  Genitourinary:  Negative for decreased urine volume,  difficulty urinating, dysuria, flank pain, frequency, genital sores, hematuria, menstrual problem, pelvic pain, urgency, vaginal bleeding, vaginal discharge and vaginal pain.  Musculoskeletal:  Negative for back pain.  Neurological:  Negative for dizziness, weakness and light-headedness.  All other systems reviewed and are negative.   Physical Exam Triage Vital Signs ED Triage Vitals  Enc Vitals Group     BP      Pulse      Resp      Temp      Temp src      SpO2      Weight      Height      Head Circumference      Peak Flow      Pain Score      Pain Loc      Pain Edu?      Excl. in GC?    No data found.  Updated Vital Signs BP 140/90 (BP Location: Right Arm)   Pulse 96   Temp 97.9 F (36.6 C) (Oral)   Resp 18   LMP 12/13/1999 Comment: single oophorectomy//a.c.  SpO2 98%   Visual Acuity Right Eye Distance:   Left Eye Distance:   Bilateral Distance:    Right Eye Near:   Left Eye Near:    Bilateral Near:     Physical Exam Vitals reviewed.  Constitutional:      General: She is not in acute distress.    Appearance: Normal appearance. She is not ill-appearing.  HENT:     Head: Normocephalic and atraumatic.     Mouth/Throat:     Mouth: Mucous membranes are moist.     Comments: Moist mucous membranes Eyes:     Extraocular Movements: Extraocular movements intact.     Pupils: Pupils are equal, round, and reactive to light.  Cardiovascular:     Rate and Rhythm: Normal rate and regular rhythm.     Heart sounds: Normal heart sounds.  Pulmonary:     Effort: Pulmonary effort is normal.     Breath sounds: Normal breath sounds. No wheezing, rhonchi or rales.  Abdominal:     General: Bowel sounds are normal. There is no distension.     Palpations: Abdomen is soft. There is no mass.     Tenderness: There is no abdominal tenderness. There is no right CVA tenderness, left CVA tenderness, guarding or rebound.  Genitourinary:    Comments: deferred Skin:    General:  Skin is warm.     Capillary Refill: Capillary refill takes less than 2 seconds.     Comments: Good skin turgor  Neurological:     General: No focal deficit present.     Mental Status: She is alert and oriented to person, place, and time.  Psychiatric:        Mood and Affect: Mood normal.        Behavior: Behavior normal.     UC Treatments / Results  Labs (all labs ordered are listed, but only abnormal results are displayed) Labs Reviewed  POCT URINALYSIS DIPSTICK, ED / UC  CERVICOVAGINAL ANCILLARY ONLY    EKG   Radiology No results  found.  Procedures Procedures (including critical care time)  Medications Ordered in UC Medications - No data to display  Initial Impression / Assessment and Plan / UC Course  I have reviewed the triage vital signs and the nursing notes.  Pertinent labs & imaging results that were available during my care of the patient were reviewed by me and considered in my medical decision making (see chart for details).     This patient is a very pleasant 45 y.o. year old female presenting with vaginitis. Suspect candidiasis. Afebrile, nontachycardic, no reproducible abd pain or CVAT.  This patient frequently takes Macrobid to prevent UTI.  Long history of recurrent candidiasis.  UA wnl, did not send culture. Hysterectomy for contraception.   Diflucan, flagyl. F/u with gyn as scheduled 02/24/21.   ED return precautions discussed. Patient verbalizes understanding and agreement.   Final Clinical Impressions(s) / UC Diagnoses   Final diagnoses:  Vaginitis and vulvovaginitis     Discharge Instructions      -For your yeast infection, start the Diflucan (fluconazole)- Take one pill today (day 1). If you're still having symptoms in 3 days, take the second pill.  -For bacterial vaginosis, start the antibiotic-Flagyl (metronidazole), 2 pills daily for 7 days.  You can take this with food if you have a sensitive stomach.  Avoid alcohol while taking this  medication and for 2 days after as this will cause severe nausea and vomiting. -we'll call in 2-3 days if we need to change anything -Abstain from intercourse while symptoms persist -Boric acid for additional relief -Follow-up with OB as scheduled 9/26     ED Prescriptions     Medication Sig Dispense Auth. Provider   fluconazole (DIFLUCAN) 150 MG tablet Take 1 tablet (150 mg total) by mouth daily. -For your yeast infection, start the Diflucan (fluconazole)- Take one pill today (day 1). If you're still having symptoms in 3 days, take the second pill. 2 tablet Rhys Martini, PA-C   metroNIDAZOLE (FLAGYL) 500 MG tablet Take 1 tablet (500 mg total) by mouth 2 (two) times daily. Avoid alcohol while taking this medication and for 2 days after 14 tablet Rhys Martini, PA-C      PDMP not reviewed this encounter.   Rhys Martini, PA-C 02/22/21 1554

## 2021-02-22 NOTE — Discharge Instructions (Addendum)
-  For your yeast infection, start the Diflucan (fluconazole)- Take one pill today (day 1). If you're still having symptoms in 3 days, take the second pill.  -For bacterial vaginosis, start the antibiotic-Flagyl (metronidazole), 2 pills daily for 7 days.  You can take this with food if you have a sensitive stomach.  Avoid alcohol while taking this medication and for 2 days after as this will cause severe nausea and vomiting. -we'll call in 2-3 days if we need to change anything -Abstain from intercourse while symptoms persist -Boric acid for additional relief -Follow-up with OB as scheduled 9/26

## 2021-02-24 ENCOUNTER — Encounter: Payer: Self-pay | Admitting: Family Medicine

## 2021-02-24 ENCOUNTER — Telehealth: Payer: Self-pay | Admitting: Family Medicine

## 2021-02-24 LAB — CERVICOVAGINAL ANCILLARY ONLY
Bacterial Vaginitis (gardnerella): NEGATIVE
Candida Glabrata: NEGATIVE
Candida Vaginitis: NEGATIVE
Chlamydia: NEGATIVE
Comment: NEGATIVE
Comment: NEGATIVE
Comment: NEGATIVE
Comment: NEGATIVE
Comment: NEGATIVE
Comment: NORMAL
Neisseria Gonorrhea: NEGATIVE
Trichomonas: NEGATIVE

## 2021-02-24 NOTE — Telephone Encounter (Signed)
She already has refills until Nov. 22

## 2021-02-24 NOTE — Telephone Encounter (Signed)
Patient requesting Adderall XR 30 mg, to be sent to [facility] in [place].   Pharmacy has been updated.

## 2021-02-24 NOTE — Telephone Encounter (Addendum)
PT called to request a refill of their amphetamine-dextroamphetamine (ADDERALL XR) 30 MG 24 hr capsule to be called into the [facility] located at:  [address], [place] [address] # [phone]  PT also states they are currently out of their medication after taking their last one today.

## 2021-02-25 MED ORDER — AMPHETAMINE-DEXTROAMPHET ER 30 MG PO CP24: 30.0000 mg | ORAL_CAPSULE | ORAL | 0 refills | Status: DC

## 2021-02-25 NOTE — Telephone Encounter (Signed)
I sent in a 7 day supply  

## 2021-02-25 NOTE — Telephone Encounter (Signed)
I sent in a 7 day supply as requested

## 2021-02-25 NOTE — Telephone Encounter (Signed)
Reply was sent to patient via my chart from previous message that was sent.

## 2021-02-25 NOTE — Addendum Note (Signed)
Addended by: Gershon Crane A on: 02/25/2021 07:59 AM   Modules accepted: Orders

## 2021-02-26 ENCOUNTER — Ambulatory Visit (INDEPENDENT_AMBULATORY_CARE_PROVIDER_SITE_OTHER): Payer: BC Managed Care – PPO | Admitting: Psychology

## 2021-02-26 DIAGNOSIS — F331 Major depressive disorder, recurrent, moderate: Secondary | ICD-10-CM

## 2021-03-05 ENCOUNTER — Ambulatory Visit: Payer: BC Managed Care – PPO | Admitting: Psychology

## 2021-03-12 ENCOUNTER — Ambulatory Visit (INDEPENDENT_AMBULATORY_CARE_PROVIDER_SITE_OTHER): Payer: BC Managed Care – PPO | Admitting: Psychology

## 2021-03-12 DIAGNOSIS — F331 Major depressive disorder, recurrent, moderate: Secondary | ICD-10-CM

## 2021-03-19 ENCOUNTER — Ambulatory Visit: Payer: BC Managed Care – PPO | Admitting: Psychology

## 2021-03-19 ENCOUNTER — Other Ambulatory Visit: Payer: Self-pay | Admitting: Family Medicine

## 2021-03-19 DIAGNOSIS — F331 Major depressive disorder, recurrent, moderate: Secondary | ICD-10-CM | POA: Diagnosis not present

## 2021-03-22 DIAGNOSIS — U071 COVID-19: Secondary | ICD-10-CM | POA: Diagnosis not present

## 2021-03-26 ENCOUNTER — Ambulatory Visit (INDEPENDENT_AMBULATORY_CARE_PROVIDER_SITE_OTHER): Payer: BC Managed Care – PPO | Admitting: Psychology

## 2021-03-26 DIAGNOSIS — F331 Major depressive disorder, recurrent, moderate: Secondary | ICD-10-CM

## 2021-04-02 ENCOUNTER — Ambulatory Visit (INDEPENDENT_AMBULATORY_CARE_PROVIDER_SITE_OTHER): Payer: BC Managed Care – PPO | Admitting: Psychology

## 2021-04-02 DIAGNOSIS — F331 Major depressive disorder, recurrent, moderate: Secondary | ICD-10-CM | POA: Diagnosis not present

## 2021-04-09 ENCOUNTER — Ambulatory Visit (INDEPENDENT_AMBULATORY_CARE_PROVIDER_SITE_OTHER): Payer: BC Managed Care – PPO | Admitting: Psychology

## 2021-04-09 DIAGNOSIS — F331 Major depressive disorder, recurrent, moderate: Secondary | ICD-10-CM | POA: Diagnosis not present

## 2021-04-16 ENCOUNTER — Ambulatory Visit (INDEPENDENT_AMBULATORY_CARE_PROVIDER_SITE_OTHER): Payer: BC Managed Care – PPO | Admitting: Psychology

## 2021-04-16 DIAGNOSIS — F331 Major depressive disorder, recurrent, moderate: Secondary | ICD-10-CM

## 2021-04-21 ENCOUNTER — Other Ambulatory Visit: Payer: Self-pay | Admitting: Family Medicine

## 2021-04-21 ENCOUNTER — Ambulatory Visit: Payer: BC Managed Care – PPO | Admitting: Psychology

## 2021-04-22 DIAGNOSIS — U071 COVID-19: Secondary | ICD-10-CM | POA: Diagnosis not present

## 2021-04-23 ENCOUNTER — Ambulatory Visit: Payer: BC Managed Care – PPO | Admitting: Psychology

## 2021-04-23 ENCOUNTER — Other Ambulatory Visit: Payer: Self-pay | Admitting: Family Medicine

## 2021-04-29 ENCOUNTER — Telehealth: Payer: Self-pay | Admitting: Family Medicine

## 2021-04-29 NOTE — Telephone Encounter (Signed)
Patient is requesting a 5 day refill for amphetamine-dextroamphetamine (ADDERALL XR) 30 MG 24 hr capsule [[id number]] and Oxy (pain medicine) until her appointment on 05/05/2021.  Patient could be contacted at [phone].  Please advise.

## 2021-04-30 ENCOUNTER — Ambulatory Visit (INDEPENDENT_AMBULATORY_CARE_PROVIDER_SITE_OTHER): Payer: BC Managed Care – PPO | Admitting: Psychology

## 2021-04-30 DIAGNOSIS — F331 Major depressive disorder, recurrent, moderate: Secondary | ICD-10-CM

## 2021-04-30 MED ORDER — OXYCODONE HCL 15 MG PO TABS: 15.0000 mg | ORAL_TABLET | ORAL | 0 refills | Status: DC | PRN

## 2021-04-30 MED ORDER — AMPHETAMINE-DEXTROAMPHET ER 30 MG PO CP24: 30.0000 mg | ORAL_CAPSULE | ORAL | 0 refills | Status: DC

## 2021-04-30 NOTE — Telephone Encounter (Signed)
Done

## 2021-04-30 NOTE — Telephone Encounter (Signed)
Message sent to pt through MyChart.  

## 2021-05-05 ENCOUNTER — Telehealth (INDEPENDENT_AMBULATORY_CARE_PROVIDER_SITE_OTHER): Payer: BC Managed Care – PPO | Admitting: Family Medicine

## 2021-05-05 ENCOUNTER — Telehealth: Payer: Self-pay | Admitting: Family Medicine

## 2021-05-05 ENCOUNTER — Encounter: Payer: Self-pay | Admitting: Family Medicine

## 2021-05-05 DIAGNOSIS — M48061 Spinal stenosis, lumbar region without neurogenic claudication: Secondary | ICD-10-CM | POA: Diagnosis not present

## 2021-05-05 DIAGNOSIS — F1190 Opioid use, unspecified, uncomplicated: Secondary | ICD-10-CM | POA: Diagnosis not present

## 2021-05-05 MED ORDER — LEVOFLOXACIN 500 MG PO TABS: 500.0000 mg | ORAL_TABLET | Freq: Every day | ORAL | 0 refills | Status: AC

## 2021-05-05 MED ORDER — OXYCODONE HCL 15 MG PO TABS: 15.0000 mg | ORAL_TABLET | ORAL | 0 refills | Status: DC | PRN

## 2021-05-05 MED ORDER — AMPHETAMINE-DEXTROAMPHET ER 30 MG PO CP24: 30.0000 mg | ORAL_CAPSULE | ORAL | 0 refills | Status: DC

## 2021-05-05 MED ORDER — HYDROMORPHONE HCL 8 MG PO TABS: 8.0000 mg | ORAL_TABLET | Freq: Every evening | ORAL | 0 refills | Status: DC | PRN

## 2021-05-05 MED ORDER — HYDROMORPHONE HCL 8 MG PO TABS
8.0000 mg | ORAL_TABLET | Freq: Every evening | ORAL | 0 refills | Status: DC | PRN
Start: 2021-07-06 — End: 2021-08-04

## 2021-05-05 NOTE — Telephone Encounter (Signed)
Patient is requesting a yeast infection pill to go along with the antibiotic Dr.Fry prescribed.  Please advise.

## 2021-05-05 NOTE — Progress Notes (Signed)
   Subjective:    Patient ID: [name], female    DOB: 03/05/1976, 45 y.o.   MRN: [id number]  HPI Virtual Visit via Telephone Note  I connected with the patient on 05/05/21 at  3:45 PM EST by telephone and verified that I am speaking with the correct person using two identifiers.   I discussed the limitations, risks, security and privacy concerns of performing an evaluation and management service by telephone and the availability of in person appointments. I also discussed with the patient that there may be a patient responsible charge related to this service. The patient expressed understanding and agreed to proceed.  Location patient: home Location provider: work or home office Participants present for the call: patient, provider Patient did not have a visit in the prior 7 days to address this/these issue(s).   History of Present Illness: Here for pain management, she is doing well.    Observations/Objective: Patient sounds cheerful and well on the phone. I do not appreciate any SOB. Speech and thought processing are grossly intact. Patient reported vitals:  Assessment and Plan: Pain management.  Indication for chronic opioid: low back pain Medication and dose: Oxycodone 15 mg and Dilaudid 8 mg  # pills per month: 180 and 30 Last UDS date: 04-17-20 Opioid Treatment Agreement signed (Y/N): 04-17-19 Opioid Treatment Agreement last reviewed with patient:  05-05-21 [facility] reviewed this encounter (include red flags): Yes Meds were refilled.  Gershon Crane, MD   Follow Up Instructions:     [id number] 5-10 [phone] 11-20 [id number] 21-30 I did not refer this patient for an OV in the next 24 hours for this/these issue(s).  I discussed the assessment and treatment plan with the patient. The patient was provided an opportunity to ask questions and all were answered. The patient agreed with the plan and demonstrated an understanding of the instructions.   The patient was advised to  call back or seek an in-person evaluation if the symptoms worsen or if the condition fails to improve as anticipated.  I provided 24 minutes of non-face-to-face time during this encounter.   Gershon Crane, MD     Review of Systems     Objective:   Physical Exam        Assessment & Plan:
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
## 2021-05-06 NOTE — Telephone Encounter (Signed)
Call in Diflucan 150 mg to take one as needed, #1 with 5 rf 

## 2021-05-07 ENCOUNTER — Other Ambulatory Visit: Payer: Self-pay

## 2021-05-07 ENCOUNTER — Ambulatory Visit (INDEPENDENT_AMBULATORY_CARE_PROVIDER_SITE_OTHER): Payer: BC Managed Care – PPO | Admitting: Psychology

## 2021-05-07 DIAGNOSIS — F4321 Adjustment disorder with depressed mood: Secondary | ICD-10-CM | POA: Diagnosis not present

## 2021-05-07 MED ORDER — FLUCONAZOLE 150 MG PO TABS: ORAL_TABLET | ORAL | 5 refills | Status: DC

## 2021-05-07 NOTE — Telephone Encounter (Addendum)
Lvm for patient that Diflucan 150mg  has been sent to [facility] in [place].   Pharmacy will notify patient when ready for pickup

## 2021-05-14 ENCOUNTER — Ambulatory Visit (INDEPENDENT_AMBULATORY_CARE_PROVIDER_SITE_OTHER): Payer: BC Managed Care – PPO | Admitting: Psychology

## 2021-05-14 DIAGNOSIS — F331 Major depressive disorder, recurrent, moderate: Secondary | ICD-10-CM | POA: Diagnosis not present

## 2021-05-14 DIAGNOSIS — F4322 Adjustment disorder with anxiety: Secondary | ICD-10-CM
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
## 2021-05-15 ENCOUNTER — Ambulatory Visit (INDEPENDENT_AMBULATORY_CARE_PROVIDER_SITE_OTHER): Payer: BC Managed Care – PPO | Admitting: Psychology

## 2021-05-15 DIAGNOSIS — F331 Major depressive disorder, recurrent, moderate: Secondary | ICD-10-CM
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
## 2021-05-21 ENCOUNTER — Ambulatory Visit (INDEPENDENT_AMBULATORY_CARE_PROVIDER_SITE_OTHER): Payer: BC Managed Care – PPO | Admitting: Psychology

## 2021-05-21 DIAGNOSIS — F331 Major depressive disorder, recurrent, moderate: Secondary | ICD-10-CM

## 2021-05-21 NOTE — Progress Notes (Signed)
Date: 05/21/2021  Treatment Plan Diagnosis 296.33 (Major depressive affective disorder, recurrent episode, severe, without mention of psychotic behavior) [n/a]  F43.22 (Adjustment Disorder, With anxiety) [n/a]  Symptoms One partner engages in sexual behavior (e.g., penile-vaginal intercourse, oral sex, anal sex) that violates the explicit or implicit expectations of the relationship. (Status: maintained) -- No Description Entered  Medication Status compliance  Safety none  If Suicidal or Homicidal State Action Taken: unspecified  Current Risk: low Medications unspecified Objectives Related Problem: Partners verbally express empathy toward each other and demonstrate shared responsibility for reconstructing the relationship. Description: Clarify what level of trust exists for various aspects of the relationship. Target Date: 2021-09-25 Frequency: Daily Modality: individual Progress: 20%  Related Problem: Partners verbally express empathy toward each other and demonstrate shared responsibility for reconstructing the relationship. Description: List relationship issues that contribute to dissatisfaction and unhappiness and agree to address these in future conjoint sessions. Target Date: 2021-09-25 Frequency: Daily Modality: individual Progress: 40%  Related Problem: Partners verbally express empathy toward each other and demonstrate shared responsibility for reconstructing the relationship. Description: Identify and list behavioral changes for self and partner that would enhance the relationship. Target Date: 2021-09-25 Frequency: Daily Modality: individual Progress: 40%  Related Problem: Partners verbally express empathy toward each other and demonstrate shared responsibility for reconstructing the relationship. Description: Describe pre-affair relationship functioning. Target Date: 2021-09-25 Frequency: Daily Modality: individual Progress: 50%  Related Problem: Partners  verbally express empathy toward each other and demonstrate shared responsibility for reconstructing the relationship. Description: Ask the partners to generate factors that they believe contributed to the affair. Target Date: 2021-09-25 Frequency: Daily Modality: individual Progress: 60%  Related Problem: Partners verbally express empathy toward each other and demonstrate shared responsibility for reconstructing the relationship. Description: Hurt partner uses anxiety-management techniques to deal with intrusive thoughts about the affair. Target Date: 2021-09-25 Frequency: Daily Modality: individual Progress: 20%  Client Response full compliance  Service Location Location, [address] [place], [place] [address]  Service Code cpt [phone]  Self care activities  Provide education, information  Emotion regulation skills  Validate/empathize  Identify/label emotions  Facilitate problem solving  Comments  Patient agrees to a video session due to the pandemic. They are at home and I am in my home office.  Dx.: Depression  Meds: Xanax. Tried antidep. meds, but she has significant side-effects. Goals: They are seeking counseling to resolve significant marital conflict secondary to infidelity. [name] has experienced symptoms of both anxiety and depression during this period and is hoping to reduce these symptoms as the relationship issues improve. [name] is experiencing some depressive symptoms as well, along with considerable anger and frustration. Goal date 2-23   [name] and [name]: She states that after the last session she was ready to "pack up and go". [name] suggested she stay through the week to wrap the gifts for the kids. She agreed as long as he "promised to not call her names". She says they were doing okay until she reminded him of therapy today and he got agitated, saying he did not want to participate. I challenged them to consider separation because being together is so destructive.  Both resist, saying if they separate then they will divorce. [name] continues to maintain he does not have any assurance that if he expresses love and caring for [name] that it will help. He will not share any of those feelings and [name] is focused on why he expressed love for another woman and cannot with her.  [name]  Time: 5:10p-6:00p 50 minutes

## 2021-05-22 DIAGNOSIS — U071 COVID-19: Secondary | ICD-10-CM | POA: Diagnosis not present

## 2021-05-27 DIAGNOSIS — I951 Orthostatic hypotension: Secondary | ICD-10-CM | POA: Insufficient documentation

## 2021-05-27 DIAGNOSIS — R000 Tachycardia, unspecified: Secondary | ICD-10-CM | POA: Insufficient documentation

## 2021-05-29 ENCOUNTER — Ambulatory Visit: Payer: BC Managed Care – PPO | Admitting: Internal Medicine

## 2021-05-31 ENCOUNTER — Other Ambulatory Visit: Payer: Self-pay | Admitting: Family Medicine

## 2021-06-03 NOTE — Telephone Encounter (Signed)
Last refill- 09-03-20--90 tabs, 5 refills Last VV-05/05/21  No future OV schedule.   Can this patient receive a refill?

## 2021-06-04 ENCOUNTER — Ambulatory Visit: Payer: BC Managed Care – PPO | Admitting: Psychology

## 2021-06-05 ENCOUNTER — Telehealth: Payer: BC Managed Care – PPO | Admitting: Physician Assistant

## 2021-06-05 DIAGNOSIS — R3989 Other symptoms and signs involving the genitourinary system: Secondary | ICD-10-CM | POA: Diagnosis not present

## 2021-06-05 MED ORDER — SULFAMETHOXAZOLE-TRIMETHOPRIM 800-160 MG PO TABS
1.0000 | ORAL_TABLET | Freq: Two times a day (BID) | ORAL | 0 refills | Status: DC
Start: 2021-06-05 — End: 2021-08-04

## 2021-06-05 NOTE — Progress Notes (Signed)
I have spent 5 minutes in review of e-visit questionnaire, review and updating patient chart, medical decision making and response to patient.   Juda Toepfer Cody Amyre Segundo, PA-C    

## 2021-06-05 NOTE — Progress Notes (Signed)

## 2021-06-06 DIAGNOSIS — R1030 Lower abdominal pain, unspecified: Secondary | ICD-10-CM | POA: Diagnosis not present

## 2021-06-06 DIAGNOSIS — N7689 Other specified inflammation of vagina and vulva: Secondary | ICD-10-CM | POA: Diagnosis not present

## 2021-06-06 DIAGNOSIS — R109 Unspecified abdominal pain: Secondary | ICD-10-CM | POA: Diagnosis not present

## 2021-06-06 DIAGNOSIS — N10 Acute tubulo-interstitial nephritis: Secondary | ICD-10-CM | POA: Diagnosis not present

## 2021-06-06 DIAGNOSIS — R1010 Upper abdominal pain, unspecified: Secondary | ICD-10-CM | POA: Diagnosis not present

## 2021-06-06 DIAGNOSIS — R1032 Left lower quadrant pain: Secondary | ICD-10-CM | POA: Diagnosis not present

## 2021-06-06 DIAGNOSIS — Z91041 Radiographic dye allergy status: Secondary | ICD-10-CM | POA: Diagnosis not present

## 2021-06-06 DIAGNOSIS — R1031 Right lower quadrant pain: Secondary | ICD-10-CM | POA: Diagnosis not present

## 2021-06-06 MED ORDER — PHENAZOPYRIDINE HCL 200 MG PO TABS: 200.0000 mg | ORAL_TABLET | Freq: Three times a day (TID) | ORAL | 0 refills | Status: DC

## 2021-06-06 NOTE — Addendum Note (Signed)
Addended by: Montine Circle B on: 06/06/2021 12:22 PM   Modules accepted: Orders

## 2021-06-08 DIAGNOSIS — R102 Pelvic and perineal pain: Secondary | ICD-10-CM | POA: Diagnosis not present

## 2021-06-08 DIAGNOSIS — N898 Other specified noninflammatory disorders of vagina: Secondary | ICD-10-CM | POA: Diagnosis not present

## 2021-06-08 DIAGNOSIS — R300 Dysuria: Secondary | ICD-10-CM | POA: Diagnosis not present

## 2021-06-08 DIAGNOSIS — R309 Painful micturition, unspecified: Secondary | ICD-10-CM | POA: Diagnosis not present

## 2021-06-08 DIAGNOSIS — N899 Noninflammatory disorder of vagina, unspecified: Secondary | ICD-10-CM | POA: Diagnosis not present

## 2021-06-08 DIAGNOSIS — Z79891 Long term (current) use of opiate analgesic: Secondary | ICD-10-CM | POA: Diagnosis not present

## 2021-06-08 LAB — OB RESULTS CONSOLE GC/CHLAMYDIA: Chlamydia: NEGATIVE

## 2021-06-09 ENCOUNTER — Encounter: Payer: Self-pay | Admitting: Family Medicine

## 2021-06-11 ENCOUNTER — Ambulatory Visit (INDEPENDENT_AMBULATORY_CARE_PROVIDER_SITE_OTHER): Payer: BC Managed Care – PPO | Admitting: Psychology

## 2021-06-11 DIAGNOSIS — F4322 Adjustment disorder with anxiety: Secondary | ICD-10-CM

## 2021-06-11 DIAGNOSIS — F331 Major depressive disorder, recurrent, moderate: Secondary | ICD-10-CM

## 2021-06-11 NOTE — Progress Notes (Signed)
Date: 06/11/2021  Treatment Plan Diagnosis 296.33 (Major depressive affective disorder, recurrent episode, severe, without mention of psychotic behavior) [n/a]  F43.22 (Adjustment Disorder, With anxiety) [n/a]  Symptoms One partner engages in sexual behavior (e.g., penile-vaginal intercourse, oral sex, anal sex) that violates the explicit or implicit expectations of the relationship. (Status: maintained) -- No Description Entered  Medication Status compliance  Safety none  If Suicidal or Homicidal State Action Taken: unspecified  Current Risk: low Medications unspecified Objectives Related Problem: Partners verbally express empathy toward each other and demonstrate shared responsibility for reconstructing the relationship. Description: Clarify what level of trust exists for various aspects of the relationship. Target Date: 2021-09-25 Frequency: Daily Modality: individual Progress: 20%  Related Problem: Partners verbally express empathy toward each other and demonstrate shared responsibility for reconstructing the relationship. Description: List relationship issues that contribute to dissatisfaction and unhappiness and agree to address these in future conjoint sessions. Target Date: 2021-09-25 Frequency: Daily Modality: individual Progress: 40%  Related Problem: Partners verbally express empathy toward each other and demonstrate shared responsibility for reconstructing the relationship. Description: Identify and list behavioral changes for self and partner that would enhance the relationship. Target Date: 2021-09-25 Frequency: Daily Modality: individual Progress: 40%  Related Problem: Partners verbally express empathy toward each other and demonstrate shared responsibility for reconstructing the relationship. Description: Describe pre-affair relationship functioning. Target Date: 2021-09-25 Frequency: Daily Modality: individual Progress: 50%  Related Problem: Partners verbally  express empathy toward each other and demonstrate shared responsibility for reconstructing the relationship. Description: Ask the partners to generate factors that they believe contributed to the affair. Target Date: 2021-09-25 Frequency: Daily Modality: individual Progress: 60%   Problem: Partners verbally express empathy toward each other and demonstrate shared responsibility for reconstructing the relationship. Description: Hurt partner uses anxiety-management techniques to deal with intrusive thoughts about the affair. Target Date: 2021-09-25 Frequency: Daily Modality: individual Progress: 30%  Client Response full compliance  Service Location Location, [address] [place], [place] [address]  Service Code cpt [phone]  Self care activities  Provide education, information  Emotion regulation skills  Validate/empathize  Identify/label emotions  Facilitate problem solving  Comments  Patient agrees to a video session due to the pandemic. They are at home and I am in my home office.  Dx.: Depression  Meds: Xanax. Tried antidep. meds, but she has significant side-effects. Goals: They are seeking counseling to resolve significant marital conflict secondary to infidelity. [name] has experienced symptoms of both anxiety and depression during this period and is hoping to reduce these symptoms as the relationship issues improve. [name] is experiencing some depressive symptoms as well, along with considerable anger and frustration. Goal date 2-23   [name] and [name]: She states that [name] is now working at night. [name] says that [name] gets very distressed around the holiday because he does not have money and feels bad that he cannot get gifts for her and the family. She does not get upset about it, but he knows it makes her feel (a little) bad. She told him that in the [place], she will not tolerate him being disrespectful to her. York Spaniel that she will leave if he treats her poorly. She says that  she has gotten to a place where something had to change and that is why she decided to take a stand. [name] says that he has made a decision to not take responsibility for the kid's problems. He feels this has taken a lot of stress off of him and that  is why he is less reactive. [name] does say that it continues to be distressing that [name] will not share feelings with her. She found out that she now has herpes. She is on antibiotics and is in a lot of pain. She does say [name] has been physically supportive, but not verbally expressive of his feelings. He claims to be concerned he will "say the wrong thing and make it worse". Encouraged him to work on being sympathetic and supportive. Expecting a new grandson tomorrow.       [name]  Time: 4:10p-5:10p 60 minutes

## 2021-06-13 ENCOUNTER — Encounter: Payer: Self-pay | Admitting: Family Medicine

## 2021-06-13 MED ORDER — AMPHETAMINE-DEXTROAMPHET ER 30 MG PO CP24: 30.0000 mg | ORAL_CAPSULE | ORAL | 0 refills | Status: DC

## 2021-06-13 NOTE — Telephone Encounter (Signed)
I sent in a 7 day supply  

## 2021-06-18 ENCOUNTER — Ambulatory Visit (INDEPENDENT_AMBULATORY_CARE_PROVIDER_SITE_OTHER): Payer: BC Managed Care – PPO | Admitting: Psychology

## 2021-06-18 DIAGNOSIS — F331 Major depressive disorder, recurrent, moderate: Secondary | ICD-10-CM

## 2021-06-18 NOTE — Telephone Encounter (Signed)
Please ask the pharmacy to fill this today

## 2021-06-18 NOTE — Progress Notes (Signed)
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 (Status: maintained) -- No Description Entered  Medication Status compliance  Safety none  If Suicidal or Homicidal State Action Taken: unspecified  Current Risk: low Medications unspecified Objectives Related Problem: Partners verbally express empathy toward each other and demonstrate shared responsibility for reconstructing the relationship. Description: Clarify what level of trust exists for various aspects of the relationship. Target Date: 2021-09-25 Frequency: Daily Modality: individual Progress: 50%  Related Problem: Partners verbally express empathy toward each other and demonstrate shared responsibility for reconstructing the relationship. Description: List relationship issues that contribute to dissatisfaction and unhappiness and agree to address these in future conjoint sessions. Target Date: 2021-09-25 Frequency: Daily Modality: individual Progress: 40%  Related Problem: Partners verbally express empathy toward each other and demonstrate shared responsibility for reconstructing the relationship. Description: Identify and list behavioral changes for self and partner that would enhance the relationship. Target Date: 2021-09-25 Frequency: Daily Modality: individual Progress: 40%  Related Problem: Partners verbally express empathy toward each other and demonstrate shared responsibility for reconstructing the relationship. Description: Describe pre-affair relationship functioning. Target Date: 2021-09-25 Frequency: Daily Modality: individual Progress: 50%  Related Problem: Partners verbally  express empathy toward each other and demonstrate shared responsibility for reconstructing the relationship. Description: Ask the partners to generate factors that they believe contributed to the affair. Target Date: 2021-09-25 Frequency: Daily Modality: individual Progress: 60%   Problem: Partners verbally express empathy toward each other and demonstrate shared responsibility for reconstructing the relationship. Description: Hurt partner uses anxiety-management techniques to deal with intrusive thoughts about the affair. Target Date: 2021-09-25 Frequency: Daily Modality: individual Progress: 40%  Client Response full compliance  Service Location Location, [address] [place], [place] [address]  Service Code cpt [phone]  Self care activities  Provide education, information  Emotion regulation skills  Validate/empathize  Identify/label emotions  Facilitate problem solving  Comments  Patient agrees to a video session due to the pandemic. They are at home and I am in my home office.  Dx.: Depression  Meds: Xanax. Tried antidep. meds, but she has significant side-effects. Goals: They are seeking counseling to resolve significant marital conflict secondary to infidelity. [name] has experienced symptoms of both anxiety and depression during this period and is hoping to reduce these symptoms as the relationship issues improve. [name] is experiencing some depressive symptoms as well, along with considerable anger and frustration. 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 She does not trust what he has to say and states that he still does not tell her what she has expressed she needs from him. He feels he cannot communicate anything to [place] without it turning into an argument. She continues to make comparisons of how he treated his girlfriend and he she feels treated. She says that she is not feeling loved  and not sure if she should stay in the marriage. I told Silva that I will speak with her next week without [name]. He is angry about participating in sessions and feels it just "adds" to their distress.          [name], Ph.D.  Time: 5:10p-6:05p 55 minutes  Keli Buehner LEWIS, PhD 

## 2021-06-19 MED ORDER — AMPHETAMINE-DEXTROAMPHET ER 30 MG PO CP24: 30.0000 mg | ORAL_CAPSULE | ORAL | 0 refills | Status: DC

## 2021-06-19 NOTE — Telephone Encounter (Signed)
Pt pharmacy will not take verbal orders, state that this a control substance and they need a new Rx sent.Please advise

## 2021-06-19 NOTE — Telephone Encounter (Signed)
I just sent in a 7 day supply dated TODAY

## 2021-06-20 NOTE — Telephone Encounter (Signed)
Good Morning We sent in prior Authorization for your husbands Desvenlafaxine succinate 50 mg to his insurance for approval, we will keep you updated soon as we hear back from insurance.

## 2021-06-22 DIAGNOSIS — U071 COVID-19: Secondary | ICD-10-CM | POA: Diagnosis not present

## 2021-06-25 ENCOUNTER — Ambulatory Visit: Payer: BC Managed Care – PPO | Admitting: Psychology

## 2021-06-25 DIAGNOSIS — F331 Major depressive disorder, recurrent, moderate: Secondary | ICD-10-CM | POA: Diagnosis not present

## 2021-06-25 NOTE — Progress Notes (Signed)
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 (Status: maintained) -- No Description Entered  Medication Status compliance  Safety none  If Suicidal or Homicidal State Action Taken: unspecified  Current Risk: low Medications unspecified Objectives Related Problem: Partners verbally express empathy toward each other and demonstrate shared responsibility for reconstructing the relationship. Description: Clarify what level of trust exists for various aspects of the relationship. Target Date: 2021-09-25 Frequency: Daily Modality: individual Progress: 50%  Related Problem: Partners verbally express empathy toward each other and demonstrate shared responsibility for reconstructing the relationship. Description: List relationship issues that contribute to dissatisfaction and unhappiness and agree to address these in future conjoint sessions. Target Date: 2021-09-25 Frequency: Daily Modality: individual Progress: 40%  Related Problem: Partners verbally express empathy toward each other and demonstrate shared responsibility for reconstructing the relationship. Description: Identify and list behavioral changes for self and partner that would enhance the relationship. Target Date: 2021-09-25 Frequency: Daily Modality: individual Progress: 40%  Related Problem: Partners verbally express empathy toward each other and demonstrate shared responsibility for reconstructing the relationship. Description: Describe pre-affair relationship functioning. Target Date: 2021-09-25 Frequency: Daily Modality: individual Progress: 50%  Related Problem: Partners verbally  express empathy toward each other and demonstrate shared responsibility for reconstructing the relationship. Description: Ask the partners to generate factors that they believe contributed to the affair. Target Date: 2021-09-25 Frequency: Daily Modality: individual Progress: 60%   Problem: Partners verbally express empathy toward each other and demonstrate shared responsibility for reconstructing the relationship. Description: Hurt partner uses anxiety-management techniques to deal with intrusive thoughts about the affair. Target Date: 2021-09-25 Frequency: Daily Modality: individual Progress: 40%  Client Response full compliance  Service Location Location, [address] [place], [place] [address]  Service Code cpt [phone]  Self care activities  Provide education, information  Emotion regulation skills  Validate/empathize  Identify/label emotions  Facilitate problem solving  Comments  Patient agrees to a video session due to the pandemic. They are at home and I am in my home office.  Dx.: Depression  Meds: Xanax. Tried antidep. meds, but she has significant side-effects. Goals: They are seeking counseling to resolve significant marital conflict secondary to infidelity. [name] has experienced symptoms of both anxiety and depression during this period and is hoping to reduce these symptoms as the relationship issues improve. [name] is experiencing some depressive symptoms as well, along with considerable anger and frustration. Goal date 2-23   [name]: I shared my perspective with her about the status of the relationship. [name] is not working on trying to give her what she states she needs from him. He does not express any remorse or make any efforts to express love/caring. She says they had a "horrific" fight this past week (from Wed. - Sunday). She talked about feeling alone and fearful of being without [name].  She feels a little encouraged that he started a new depression medicine and  show slight signs of improvement. She says she knows she can live without him, but claims "I don't know how". She does say that he can, at times, be very nurturing toward her. Next session will be individual and they can choose who will attend.  [name], Ph.D.  Time: 4:10p-5:00p 55 minutes

## 2021-06-26 ENCOUNTER — Other Ambulatory Visit (HOSPITAL_COMMUNITY): Payer: Self-pay

## 2021-06-26 MED ORDER — AMPHETAMINE-DEXTROAMPHET ER 30 MG PO CP24: 30.0000 mg | ORAL_CAPSULE | ORAL | 0 refills | Status: DC

## 2021-06-26 MED ORDER — AMPHETAMINE-DEXTROAMPHET ER 30 MG PO CP24
30.0000 mg | ORAL_CAPSULE | ORAL | 0 refills | Status: DC
  Filled 2021-06-26: qty 30, 30d supply, fill #0

## 2021-06-26 MED ORDER — AMPHETAMINE-DEXTROAMPHET ER 30 MG PO CP24
30.0000 mg | ORAL_CAPSULE | ORAL | 0 refills | Status: DC
  Filled ????-??-?? (×2): fill #0

## 2021-06-26 NOTE — Telephone Encounter (Signed)
I sent in 3 months of refills to [facility]

## 2021-06-27 ENCOUNTER — Other Ambulatory Visit (HOSPITAL_COMMUNITY): Payer: Self-pay

## 2021-06-27 ENCOUNTER — Telehealth: Payer: Self-pay

## 2021-06-27 MED ORDER — AMPHETAMINE-DEXTROAMPHET ER 20 MG PO CP24
20.0000 mg | ORAL_CAPSULE | ORAL | 0 refills | Status: DC
  Filled 2021-06-27: qty 30, 30d supply, fill #0

## 2021-06-27 MED ORDER — AMPHETAMINE-DEXTROAMPHET ER 30 MG PO CP24
30.0000 mg | ORAL_CAPSULE | ORAL | 0 refills | Status: DC
Start: 2021-06-27 — End: 2021-07-28
  Filled 2021-06-27 – 2021-07-26 (×2): qty 30, 30d supply, fill #0

## 2021-06-27 NOTE — Telephone Encounter (Signed)
Spoke with pt notified of that Dr Clent Ridges sent in 30 days supply to [facility] pharmacy

## 2021-06-27 NOTE — Telephone Encounter (Signed)
I just sent in  one time RX dated TODAY to [facility]. What I am suggesting to all my patients on ADHD meds is to change to a different pharmacy. The computer system they recently installed at [facility] is what is causing the problems

## 2021-06-27 NOTE — Telephone Encounter (Signed)
Pt message sent to Dr Fry for advise 

## 2021-06-27 NOTE — Telephone Encounter (Signed)
Pt was notified.  

## 2021-06-27 NOTE — Telephone Encounter (Signed)
I sent in the 20 mg dose

## 2021-07-01 ENCOUNTER — Ambulatory Visit (INDEPENDENT_AMBULATORY_CARE_PROVIDER_SITE_OTHER): Payer: BC Managed Care – PPO | Admitting: Psychology

## 2021-07-01 DIAGNOSIS — F331 Major depressive disorder, recurrent, moderate: Secondary | ICD-10-CM

## 2021-07-01 NOTE — Progress Notes (Signed)
Date: 07/01/2021  Treatment Plan Diagnosis 296.33 (Major depressive affective disorder, recurrent episode, severe, without mention of psychotic behavior) [n/a]  F43.22 (Adjustment Disorder, With anxiety) [n/a]  Symptoms One partner engages in sexual behavior (e.g., penile-vaginal intercourse, oral sex, anal sex) that violates the explicit or implicit expectations of the relationship. (Status: maintained) -- No Description Entered  Medication Status compliance  Safety none  If Suicidal or Homicidal State Action Taken: unspecified  Current Risk: low Medications unspecified Objectives Related Problem: Partners verbally express empathy toward each other and demonstrate shared responsibility for reconstructing the relationship. Description: Clarify what level of trust exists for various aspects of the relationship. Target Date: 2021-09-25 Frequency: Daily Modality: individual Progress: 50%  Related Problem: Partners verbally express empathy toward each other and demonstrate shared responsibility for reconstructing the relationship. Description: List relationship issues that contribute to dissatisfaction and unhappiness and agree to address these in future conjoint sessions. Target Date: 2021-09-25 Frequency: Daily Modality: individual Progress: 40%  Related Problem: Partners verbally express empathy toward each other and demonstrate shared responsibility for reconstructing the relationship. Description: Identify and list behavioral changes for self and partner that would enhance the relationship. Target Date: 2021-09-25 Frequency: Daily Modality: individual Progress: 40%  Related Problem: Partners verbally express empathy toward each other and demonstrate shared responsibility for reconstructing the relationship. Description: Describe pre-affair relationship functioning. Target Date: 2021-09-25 Frequency: Daily Modality: individual Progress:  50%  Related Problem: Partners verbally express empathy toward each other and demonstrate shared responsibility for reconstructing the relationship. Description: Ask the partners to generate factors that they believe contributed to the affair. Target Date: 2021-09-25 Frequency: Daily Modality: individual Progress: 60%   Problem: Partners verbally express empathy toward each other and demonstrate shared responsibility for reconstructing the relationship. Description: Hurt partner uses anxiety-management techniques to deal with intrusive thoughts about the affair. Target Date: 2021-09-25 Frequency: Daily Modality: individual Progress: 40%  Client Response full compliance  Service Location Location, [address] [place], [place] [address]  Service Code cpt [phone]  Self care activities  Provide education, information  Emotion regulation skills  Validate/empathize  Identify/label emotions  Facilitate problem solving  Comments  Patient agrees to a video session due to the pandemic. They are at home and I am in my home office.  Dx.: Depression  Meds: Xanax. Tried antidep. meds, but she has significant side-effects. Goals: They are seeking counseling to resolve significant marital conflict secondary to infidelity. [name] has experienced symptoms of both anxiety and depression during this period and is hoping to reduce these symptoms as the relationship issues improve. [name] is experiencing some depressive symptoms as well, along with considerable anger and frustration. Goal date 2-23   [name]: He said he isn't sure why he is signing into session today. 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 I challenged the lack of effort on his part. He says "it will never be enough" and that whatever he does, she will want more. He says he found out this week that [name] is still logging in to his "ex-girlfriend"  social media. The woman changed  her profile picture to a vase that was the same that he bought her at one time. She felt it was something he bought her and that he was still talking with her. She remained angry at him all day. [name] says he stayed mostly calm for 20 minutes.  He says she got hysterical and he he got angry. He says he would not go through all the counseling and do all she has asked if he did not want to stay.  He is reluctant to want to separate. He says he keeps saying the same thing  (that he is sorry and it is his fault) and nothing changes. I will meet with [name] next. Talked to him about learning to walk away rather than get into fights with [name].                [name], Ph.D.  Time: 5:00p-5:55p 55 minutes

## 2021-07-02 ENCOUNTER — Ambulatory Visit: Payer: BC Managed Care – PPO | Admitting: Psychology

## 2021-07-09 ENCOUNTER — Ambulatory Visit (INDEPENDENT_AMBULATORY_CARE_PROVIDER_SITE_OTHER): Payer: BC Managed Care – PPO | Admitting: Psychology

## 2021-07-09 DIAGNOSIS — F339 Major depressive disorder, recurrent, unspecified: Secondary | ICD-10-CM

## 2021-07-09 NOTE — Progress Notes (Signed)
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 (Status: maintained) -- No Description Entered  Medication Status compliance  Safety none  If Suicidal or Homicidal State Action Taken: unspecified  Current Risk: low Medications unspecified Objectives Related Problem: Partners verbally express empathy toward each other and demonstrate shared responsibility for reconstructing the relationship. Description: Clarify what level of trust exists for various aspects of the relationship. Target Date: 2021-09-25 Frequency: Daily Modality: individual Progress: 50%  Related Problem: Partners verbally express empathy toward each other and demonstrate shared responsibility for reconstructing the relationship. Description: List relationship issues that contribute to dissatisfaction and unhappiness and agree to address these in future conjoint sessions. Target Date: 2021-09-25 Frequency: Daily Modality: individual Progress: 40%  Related Problem: Partners verbally express empathy toward each other and demonstrate shared responsibility for reconstructing the relationship. Description: Identify and list behavioral changes for self and partner that would enhance the relationship. Target Date: 2021-09-25 Frequency: Daily Modality: individual Progress: 40%  Related Problem: Partners verbally express empathy toward each other and demonstrate shared responsibility for reconstructing the relationship. Description: Describe pre-affair relationship functioning. Target Date: 2021-09-25 Frequency: Daily Modality: individual Progress: 50%  Related Problem: Partners  verbally express empathy toward each other and demonstrate shared responsibility for reconstructing the relationship. Description: Ask the partners to generate factors that they believe contributed to the affair. Target Date: 2021-09-25 Frequency: Daily Modality: individual Progress: 60%   Problem: Partners verbally express empathy toward each other and demonstrate shared responsibility for reconstructing the relationship. Description: Hurt partner uses anxiety-management techniques to deal with intrusive thoughts about the affair. Target Date: 2021-09-25 Frequency: Daily Modality: individual Progress: 40%  Client Response full compliance  Service Location Location, [address] [place], [place] [address]  Service Code cpt [phone]  Self care activities  Provide education, information  Emotion regulation skills  Validate/empathize  Identify/label emotions  Facilitate problem solving  Comments  Patient agrees to a video session due to the pandemic. They are at home and I am in my home office.  Dx.: Depression  Meds: Xanax. Tried antidep. meds, but she has significant side-effects. Goals: They are seeking counseling to resolve significant marital conflict secondary to infidelity. Jeannetta Nap has experienced symptoms of both anxiety and depression during this period and is hoping to reduce these symptoms as the relationship issues improve. [name] is experiencing some depressive symptoms as well, along with considerable anger and frustration. Goal date 2-23   [name]: She states that things were going well in her relationship until her birthday on Sunday. He did not get her a card or give her anything. He did make her breakfast in bed and wish her a happy birthday. He offered her to get a pedicure and manicure, but she declined. She says normally he gives gifts in the evening, but did not have anything for her this year.  She says he has been more affectionate the past two weeks, which is  gratifying for her. She has rejected his advances at times because she feels dirty because of the STD he gave her. She believes that he is still in touch with the other woman. She says she doesn't want to leave him if he is having emotional problems and cannot help his behavior. I talked with  her about checking on the other woman's social media. She feels compelled to check because she feels he is still in a relationship with this woman. She feels he takes "very good care of her", but not emotionally.                 [name], Ph.D.  Time: 4:05p-5:10p 65 minutes

## 2021-07-16 ENCOUNTER — Ambulatory Visit (INDEPENDENT_AMBULATORY_CARE_PROVIDER_SITE_OTHER): Payer: BC Managed Care – PPO | Admitting: Psychology

## 2021-07-16 DIAGNOSIS — F339 Major depressive disorder, recurrent, unspecified: Secondary | ICD-10-CM

## 2021-07-16 NOTE — Progress Notes (Signed)
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 (Status: maintained) -- No Description Entered  Medication Status compliance  Safety none  If Suicidal or Homicidal State Action Taken: unspecified  Current Risk: low Medications unspecified Objectives Related Problem: Partners verbally express empathy toward each other and demonstrate shared responsibility for reconstructing the relationship. Description: Clarify what level of trust exists for various aspects of the relationship. Target Date: 2021-09-25 Frequency: Daily Modality: individual Progress: 50%  Related Problem: Partners verbally express empathy toward each other and demonstrate shared responsibility for reconstructing the relationship. Description: List relationship issues that contribute to dissatisfaction and unhappiness and agree to address these in future conjoint sessions. Target Date: 2021-09-25 Frequency: Daily Modality: individual Progress: 40%  Related Problem: Partners verbally express empathy toward each other and demonstrate shared responsibility for reconstructing the relationship. Description: Identify and list behavioral changes for self and partner that would enhance the relationship. Target Date: 2021-09-25 Frequency: Daily Modality: individual Progress: 40%  Related Problem: Partners verbally express empathy toward each other and demonstrate shared responsibility for reconstructing the relationship. Description: Describe pre-affair relationship functioning. Target Date: 2021-09-25 Frequency: Daily Modality: individual Progress: 50%  Related Problem:  Partners verbally express empathy toward each other and demonstrate shared responsibility for reconstructing the relationship. Description: Ask the partners to generate factors that they believe contributed to the affair. Target Date: 2021-09-25 Frequency: Daily Modality: individual Progress: 60%   Problem: Partners verbally express empathy toward each other and demonstrate shared responsibility for reconstructing the relationship. Description: Hurt partner uses anxiety-management techniques to deal with intrusive thoughts about the affair. Target Date: 2021-09-25 Frequency: Daily Modality: individual Progress: 40%  Client Response full compliance  Service Location Location, [address] [place], [place] [address]  Service Code cpt [phone]  Self care activities  Provide education, information  Emotion regulation skills  Validate/empathize  Identify/label emotions  Facilitate problem solving  Comments  Patient agrees to a video session due to the pandemic. They are at home and I am in my home office.  Dx.: Depression  Meds: Xanax. Tried antidep. meds, but she has significant side-effects. Goals: They are seeking counseling to resolve significant marital conflict secondary to infidelity. [name] has experienced symptoms of both anxiety and depression during this period and is hoping to reduce these symptoms as the relationship issues improve. [name] is experiencing some depressive symptoms as well, along with considerable anger and frustration. Goal date 4-23   [name]: Today is her 48th wedding anniversary and he made little to no effort yesterday for Valentines Day. She also took the lead to celebrate tonight. Yesterday he waited until 7:00 and he offered to take her to dinner or to cook. He did not get her a card and he did not open her card or gift. We talked about the lack of effort on his part regarding the relationship.  She thinks that her husband's depression keeps him from  caring about anyone, including her and the kids. She is inclined to wait to see if the medicine make a change in him. She does not feel she can afford to leave the relationship. She says she cannot tolerate change and fears she has no choice or options. Told her she  needs to think practically about what she can do to separate if that is her ultimate decision.                     [name], Ph.D.  Time: 5:10p-6:00p 50 minutes

## 2021-07-17 DIAGNOSIS — I10 Essential (primary) hypertension: Secondary | ICD-10-CM | POA: Insufficient documentation

## 2021-07-21 ENCOUNTER — Ambulatory Visit: Payer: BC Managed Care – PPO | Admitting: Internal Medicine

## 2021-07-23 ENCOUNTER — Ambulatory Visit (INDEPENDENT_AMBULATORY_CARE_PROVIDER_SITE_OTHER): Payer: BC Managed Care – PPO | Admitting: Psychology

## 2021-07-23 DIAGNOSIS — F339 Major depressive disorder, recurrent, unspecified: Secondary | ICD-10-CM

## 2021-07-23 DIAGNOSIS — U071 COVID-19: Secondary | ICD-10-CM | POA: Diagnosis not present

## 2021-07-23 NOTE — Progress Notes (Signed)
Date: 07/23/2021  Treatment Plan Diagnosis 296.33 (Major depressive affective disorder, recurrent episode, severe, without mention of psychotic behavior) [n/a]  F43.22 (Adjustment Disorder, With anxiety) [n/a]  Symptoms One partner engages in sexual behavior (e.g., penile-vaginal intercourse, oral sex, anal sex) that violates the explicit or implicit expectations of the relationship. (Status: maintained) -- No Description Entered  Medication Status compliance  Safety none  If Suicidal or Homicidal State Action Taken: unspecified  Current Risk: low Medications unspecified Objectives Related Problem: Partners verbally express empathy toward each other and demonstrate shared responsibility for reconstructing the relationship. Description: Clarify what level of trust exists for various aspects of the relationship. Target Date: 2021-09-25 Frequency: Daily Modality: individual Progress: 50%  Related Problem: Partners verbally express empathy toward each other and demonstrate shared responsibility for reconstructing the relationship. Description: List relationship issues that contribute to dissatisfaction and unhappiness and agree to address these in future conjoint sessions. Target Date: 2021-09-25 Frequency: Daily Modality: individual Progress: 40%  Related Problem: Partners verbally express empathy toward each other and demonstrate shared responsibility for reconstructing the relationship. Description: Identify and list behavioral changes for self and partner that would enhance the relationship. Target Date: 2021-09-25 Frequency: Daily Modality: individual Progress: 40%  Related Problem: Partners verbally express empathy toward each other and demonstrate shared responsibility for reconstructing the relationship. Description: Describe pre-affair relationship functioning. Target Date: 2021-09-25 Frequency: Daily Modality: individual Progress: 50%  Related Problem: Partners verbally  express empathy toward each other and demonstrate shared responsibility for reconstructing the relationship. Description: Ask the partners to generate factors that they believe contributed to the affair. Target Date: 2021-09-25 Frequency: Daily Modality: individual Progress: 60%   Problem: Partners verbally express empathy toward each other and demonstrate shared responsibility for reconstructing the relationship. Description: Hurt partner uses anxiety-management techniques to deal with intrusive thoughts about the affair. Target Date: 2021-09-25 Frequency: Daily Modality: individual Progress: 40%  Client Response full compliance  Service Location Location, [address] [place], [place] [address]  Service Code cpt [phone]  Self care activities  Provide education, information  Emotion regulation skills  Validate/empathize  Identify/label emotions  Facilitate problem solving  Comments  Patient agrees to a video session due to the pandemic. They are at home and I am in my home office.  Dx.: Depression  Meds: Xanax. Tried antidep. meds, but she has significant side-effects. Goals: They are seeking counseling to resolve significant marital conflict secondary to infidelity. [name] has experienced symptoms of both anxiety and depression during this period and is hoping to reduce these symptoms as the relationship issues improve. [name] is experiencing some depressive symptoms as well, along with considerable anger and frustration. Goal date 4-23   [name]: States the week has been up and down, which is typical. Says that her husband did not do much for anniversary, but apologized. We discussed that her reactions are very "normal" under the circumstances. We reviewed the upsets between them and discussed alternative ways of communicating. She shared that she never can tell if he is telling her the truth and he feels she is always accusing him.                         [name],  Ph.D.  Time: 4:10p-5:00p 50 minutes

## 2021-07-25 ENCOUNTER — Other Ambulatory Visit: Payer: Self-pay | Admitting: Family Medicine

## 2021-07-28 ENCOUNTER — Other Ambulatory Visit (HOSPITAL_COMMUNITY): Payer: Self-pay

## 2021-07-28 ENCOUNTER — Encounter: Payer: Self-pay | Admitting: Family Medicine

## 2021-07-28 MED ORDER — AMPHETAMINE-DEXTROAMPHET ER 15 MG PO CP24
30.0000 mg | ORAL_CAPSULE | ORAL | 0 refills | Status: DC
  Filled 2021-07-28: qty 60, 30d supply, fill #0

## 2021-07-28 NOTE — Telephone Encounter (Signed)
I set in for 15 mg capsules to take two every morning (total of 30 mg)

## 2021-07-30 ENCOUNTER — Other Ambulatory Visit (HOSPITAL_COMMUNITY): Payer: Self-pay

## 2021-07-30 ENCOUNTER — Ambulatory Visit (INDEPENDENT_AMBULATORY_CARE_PROVIDER_SITE_OTHER): Payer: BC Managed Care – PPO | Admitting: Psychology

## 2021-07-30 DIAGNOSIS — F339 Major depressive disorder, recurrent, unspecified: Secondary | ICD-10-CM

## 2021-07-30 NOTE — Progress Notes (Signed)
? ? ? ? ? ? ?  Date: 07/30/2021  ?Treatment Plan ?Diagnosis ?296.33 (Major depressive affective disorder, recurrent episode, severe, without mention of psychotic behavior) [n/a]  ?F43.22 (Adjustment Disorder, With anxiety) [n/a]  ?Symptoms ?One partner engages in sexual behavior (e.g., penile-vaginal intercourse, oral sex, anal sex) that violates the explicit or implicit expectations of the relationship. (Status: maintained) -- No Description Entered  ?Medication Status ?compliance  ?Safety ?none  ?If Suicidal or Homicidal State Action Taken: unspecified  ?Current Risk: low ?Medications ?unspecified ?Objectives ?Related Problem: Partners verbally express empathy toward each other and demonstrate shared responsibility for reconstructing the relationship. ?Description: Clarify what level of trust exists for various aspects of the relationship. ?Target Date: 2021-09-25 ?Frequency: Daily ?Modality: individual ?Progress: 50% ? ?Related Problem: Partners verbally express empathy toward each other and demonstrate shared responsibility for reconstructing the relationship. ?Description: List relationship issues that contribute to dissatisfaction and unhappiness and agree to address these in future conjoint sessions. ?Target Date: 2021-09-25 ?Frequency: Daily ?Modality: individual ?Progress: 40%  ?Related Problem: Partners verbally express empathy toward each other and demonstrate shared responsibility for reconstructing the relationship. ?Description: Identify and list behavioral changes for self and partner that would enhance the relationship. ?Target Date: 2021-09-25 ?Frequency: Daily ?Modality: individual ?Progress: 40%  ?Related Problem: Partners verbally express empathy toward each other and demonstrate shared responsibility for reconstructing the relationship. ?Description: Describe pre-affair relationship functioning. ?Target Date: 2021-09-25 ?Frequency: Daily ?Modality: individual ?Progress: 50% ? ?Related Problem:  Partners verbally express empathy toward each other and demonstrate shared responsibility for reconstructing the relationship. ?Description: Ask the partners to generate factors that they believe contributed to the affair. ?Target Date: 2021-09-25 ?Frequency: Daily ?Modality: individual ?Progress: 60%  ? Problem: Partners verbally express empathy toward each other and demonstrate shared responsibility for reconstructing the relationship. ?Description: Hurt partner uses anxiety-management techniques to deal with intrusive thoughts about the affair. ?Target Date: 2021-09-25 ?Frequency: Daily ?Modality: individual ?Progress: 40%  ?Client Response ?full compliance  ?Service Location ?Location, [address] [place], [place] [address]  ?Service Code ?cpt [id number]  ?Self care activities  ?Provide education, information  ?Emotion regulation skills  ?Validate/empathize  ?Identify/label emotions  ?Facilitate problem solving  ?Comments  ?Patient agrees to a video session due to the pandemic. They are at home and I am in my home office.  ?Dx.: Depression  ?Meds: Xanax. Tried antidep. meds, but she has significant side-effects. Goals: They are seeking counseling to resolve significant marital conflict secondary to infidelity. [name] has experienced symptoms of both anxiety and depression during this period and is hoping to reduce these symptoms as the relationship issues improve. [name] is experiencing some depressive symptoms as well, along with considerable anger and frustration. Goal date 4-23  ? ?[name] and [name]: ?She has been sick and did not read the book. She says that [name] shows affection in that he will hold my hand and tell her he loves her. Both say that they got along well together. Will discuss book next time. She strongly believes that [name] has not acknowledged how difficult all of this has been for her.                           ? ?Refugio Mcconico LEWIS, Ph.D.  Time: 4:10p-5:00p 50  minutes ? ? ? ? ? ? ? ? ? ? ? ? ? ? ? ? ?

## 2021-07-31 MED ORDER — AMPHETAMINE-DEXTROAMPHET ER 20 MG PO CP24: 20.0000 mg | ORAL_CAPSULE | ORAL | 0 refills | Status: DC

## 2021-07-31 NOTE — Telephone Encounter (Signed)
I sent in for XR 20 mg for one month  ?

## 2021-08-04 ENCOUNTER — Ambulatory Visit (INDEPENDENT_AMBULATORY_CARE_PROVIDER_SITE_OTHER): Payer: BC Managed Care – PPO | Admitting: Family Medicine

## 2021-08-04 ENCOUNTER — Other Ambulatory Visit (HOSPITAL_COMMUNITY): Payer: Self-pay

## 2021-08-04 ENCOUNTER — Encounter: Payer: Self-pay | Admitting: Family Medicine

## 2021-08-04 ENCOUNTER — Other Ambulatory Visit: Payer: Self-pay

## 2021-08-04 VITALS — BP 142/90 | HR 112 | Temp 98.9°F | Wt 251.0 lb

## 2021-08-04 DIAGNOSIS — F1190 Opioid use, unspecified, uncomplicated: Secondary | ICD-10-CM

## 2021-08-04 DIAGNOSIS — M48061 Spinal stenosis, lumbar region without neurogenic claudication: Secondary | ICD-10-CM

## 2021-08-04 MED ORDER — FUROSEMIDE 20 MG PO TABS
ORAL_TABLET | ORAL | 1 refills | Status: DC
Start: 2021-08-04 — End: 2023-03-08
  Filled 2021-08-04: qty 60, 15d supply, fill #0

## 2021-08-04 MED ORDER — HYDROMORPHONE HCL 8 MG PO TABS
8.0000 mg | ORAL_TABLET | Freq: Every evening | ORAL | 0 refills | Status: AC | PRN
Start: 2021-08-04 — End: 2021-09-05
  Filled 2021-08-04: qty 30, 30d supply, fill #0

## 2021-08-04 MED ORDER — OXYCODONE HCL 15 MG PO TABS
15.0000 mg | ORAL_TABLET | ORAL | 0 refills | Status: AC | PRN
  Filled 2021-08-04: qty 180, 30d supply, fill #0

## 2021-08-04 NOTE — Progress Notes (Signed)
? ?  Subjective:  ? ? Patient ID: [name], female    DOB: 1975/07/09, 46 y.o.   MRN: [id number] ? ?HPI ?Here for pain management. She is doing well and her back pain is manageable.  ? ? ?Review of Systems  ?Constitutional: Negative.   ?Respiratory: Negative.    ?Cardiovascular: Negative.   ?Musculoskeletal:  Positive for back pain.  ? ?   ?Objective:  ? Physical Exam ?Constitutional:   ?   Appearance: Normal appearance.  ?Cardiovascular:  ?   Rate and Rhythm: Normal rate and regular rhythm.  ?   Pulses: Normal pulses.  ?   Heart sounds: Normal heart sounds.  ?Pulmonary:  ?   Effort: Pulmonary effort is normal.  ?   Breath sounds: Normal breath sounds.  ?Musculoskeletal:  ?   Comments: She is tender over the lower back and over both sciatic notches. ROM is full with negative SLR on both sides   ?Neurological:  ?   Mental Status: She is alert.  ? ? ? ? ? ?   ?Assessment & Plan:  ?Pain management. We will stay on the same regimen.  ?Indication for chronic opioid: low back pain ?Medication and dose: Oxycodone 15 mg and Dilaudid 8 mg  ?# pills per month: 180 and 30 ?Last UDS date: 08-04-21 ?Opioid Treatment Agreement signed (Y/N): 04-17-19 ?Opioid Treatment Agreement last reviewed with patient:  08-04-21 ?[facility] reviewed this encounter (include red flags): Yes ?Meds were refilled.  ?Gershon Crane, MD ? ? ?

## 2021-08-06 ENCOUNTER — Ambulatory Visit: Payer: BC Managed Care – PPO | Admitting: Psychology

## 2021-08-06 ENCOUNTER — Other Ambulatory Visit (HOSPITAL_COMMUNITY): Payer: Self-pay

## 2021-08-06 DIAGNOSIS — B001 Herpesviral vesicular dermatitis: Secondary | ICD-10-CM | POA: Diagnosis not present

## 2021-08-06 DIAGNOSIS — Z6837 Body mass index (BMI) 37.0-37.9, adult: Secondary | ICD-10-CM | POA: Diagnosis not present

## 2021-08-06 DIAGNOSIS — Z1231 Encounter for screening mammogram for malignant neoplasm of breast: Secondary | ICD-10-CM | POA: Diagnosis not present

## 2021-08-06 DIAGNOSIS — Z01419 Encounter for gynecological examination (general) (routine) without abnormal findings: Secondary | ICD-10-CM | POA: Diagnosis not present

## 2021-08-07 DIAGNOSIS — Z1272 Encounter for screening for malignant neoplasm of vagina: Secondary | ICD-10-CM | POA: Diagnosis not present

## 2021-08-07 LAB — DRUG MONITOR, PANEL 1, W/CONF, URINE
Alphahydroxyalprazolam: 142 ng/mL — ABNORMAL HIGH (ref <25)
Alphahydroxymidazolam: NEGATIVE ng/mL (ref <50)
Alphahydroxytriazolam: NEGATIVE ng/mL (ref <50)
Aminoclonazepam: NEGATIVE ng/mL (ref <25)
Amphetamine: 1794 ng/mL — ABNORMAL HIGH (ref <250)
Amphetamines: POSITIVE ng/mL — AB (ref <500)
Barbiturates: NEGATIVE ng/mL (ref <300)
Benzodiazepines: POSITIVE ng/mL — AB (ref <100)
Cocaine Metabolite: NEGATIVE ng/mL (ref <150)
Codeine: NEGATIVE ng/mL (ref <50)
Creatinine: 153.6 mg/dL (ref >=20.0)
Hydrocodone: NEGATIVE ng/mL (ref <50)
Hydromorphone: >10000 ng/mL — ABNORMAL HIGH (ref <50)
Hydroxyethylflurazepam: NEGATIVE ng/mL (ref <50)
Lorazepam: NEGATIVE ng/mL (ref <50)
Marijuana Metabolite: NEGATIVE ng/mL (ref <20)
Methadone Metabolite: NEGATIVE ng/mL (ref <100)
Methamphetamine: NEGATIVE ng/mL (ref <250)
Morphine: NEGATIVE ng/mL (ref <50)
Nordiazepam: NEGATIVE ng/mL (ref <50)
Norhydrocodone: NEGATIVE ng/mL (ref <50)
Noroxycodone: >10000 ng/mL — ABNORMAL HIGH (ref <50)
Opiates: POSITIVE ng/mL — AB (ref <100)
Oxazepam: NEGATIVE ng/mL (ref <50)
Oxidant: NEGATIVE ug/mL (ref <200)
Oxycodone: >10000 ng/mL — ABNORMAL HIGH (ref <50)
Oxycodone: POSITIVE ng/mL — AB (ref <100)
Oxymorphone: >10000 ng/mL — ABNORMAL HIGH (ref <50)
Phencyclidine: NEGATIVE ng/mL (ref <25)
Temazepam: NEGATIVE ng/mL (ref <50)
pH: 5.8 (ref 4.5–9.0)

## 2021-08-07 LAB — DM TEMPLATE

## 2021-08-13 ENCOUNTER — Ambulatory Visit (INDEPENDENT_AMBULATORY_CARE_PROVIDER_SITE_OTHER): Payer: BC Managed Care – PPO | Admitting: Psychology

## 2021-08-13 DIAGNOSIS — F339 Major depressive disorder, recurrent, unspecified: Secondary | ICD-10-CM | POA: Diagnosis not present

## 2021-08-13 NOTE — Progress Notes (Signed)
? ?Date: 08/13/2021  ?Treatment Plan ?Diagnosis ?296.33 (Major depressive affective disorder, recurrent episode, severe, without mention of psychotic behavior) [n/a]  ?F43.22 (Adjustment Disorder, With anxiety) [n/a]  ?Symptoms ?One partner engages in sexual behavior (e.g., penile-vaginal intercourse, oral sex, anal sex) that violates the explicit or implicit expectations of the relationship. (Status: maintained) -- No Description Entered  ?Medication Status ?compliance  ?Safety ?none  ?If Suicidal or Homicidal State Action Taken: unspecified  ?Current Risk: low ?Medications ?unspecified ?Objectives ?Related Problem: Partners verbally express empathy toward each other and demonstrate shared responsibility for reconstructing the relationship. ?Description: Clarify what level of trust exists for various aspects of the relationship. ?Target Date: 2021-09-25 ?Frequency: Daily ?Modality: individual ?Progress: 50% ? ?Related Problem: Partners verbally express empathy toward each other and demonstrate shared responsibility for reconstructing the relationship. ?Description: List relationship issues that contribute to dissatisfaction and unhappiness and agree to address these in future conjoint sessions. ?Target Date: 2021-09-25 ?Frequency: Daily ?Modality: individual ?Progress: 40%  ?Related Problem: Partners verbally express empathy toward each other and demonstrate shared responsibility for reconstructing the relationship. ?Description: Identify and list behavioral changes for self and partner that would enhance the relationship. ?Target Date: 2021-09-25 ?Frequency: Daily ?Modality: individual ?Progress: 40%  ?Related Problem: Partners verbally express empathy toward each other and demonstrate shared responsibility for reconstructing the relationship. ?Description: Describe pre-affair relationship functioning. ?Target Date: 2021-09-25 ?Frequency: Daily ?Modality: individual ?Progress: 50% ? ?Related Problem: Partners  verbally express empathy toward each other and demonstrate shared responsibility for reconstructing the relationship. ?Description: Ask the partners to generate factors that they believe contributed to the affair. ?Target Date: 2021-09-25 ?Frequency: Daily ?Modality: individual ?Progress: 60%  ? Problem: Partners verbally express empathy toward each other and demonstrate shared responsibility for reconstructing the relationship. ?Description: Hurt partner uses anxiety-management techniques to deal with intrusive thoughts about the affair. ?Target Date: 2021-09-25 ?Frequency: Daily ?Modality: individual ?Progress: 40%  ?Client Response ?full compliance  ?Service Location ?Location, [address] [place], [place] [address]  ?Service Code ?cpt [id number]  ?Self care activities  ?Provide education, information  ?Emotion regulation skills  ?Validate/empathize  ?Identify/label emotions  ?Facilitate problem solving  ?Comments  ?Patient agrees to a video session due to the pandemic. They are at home and I am in my home office.  ?Dx.: Depression  ?Meds: Xanax. Tried antidep. meds, but she has significant side-effects. Goals: They are seeking counseling to resolve significant marital conflict secondary to infidelity. [name] has experienced symptoms of both anxiety and depression during this period and is hoping to reduce these symptoms as the relationship issues improve. [name] is experiencing some depressive symptoms as well, along with considerable anger and frustration. Goal date 4-23  ? ?[place] and [name]: ?[name] says that they are doing okay...."normal". She means it is the new normal.She states she is upset that [name] for not responding to anything she says or texts to him. He states she keeps sending the same messages and there is no reason to respond. She feels ignored and he does not do anything to share loving feelings toward her. She says "I need to get out of this". She says she cannot go forward like this and he  says that nothing will ever change, so he will not make any effort. She says she is fearful to live without [name], but knows she is going to have to try. Her depression is worsening and he will not make efforts because of his confidence "it will not work". I shared that I felt they  are doing more harm by remaining together at this point. She is becoming increasingly more depressed and he is more angry. They can separate and continue, if they choose, to work on their relationship. At this point, however, the discord between them is becoming more unmanageable. They will discuss. ? ?                           ? ?[name], Ph.D.  Time: 5:10p-6:00p 50 minutes ? ? ? ? ? ? ? ? ? ? ? ? ? ? ? ? ?

## 2021-08-18 ENCOUNTER — Encounter: Payer: BC Managed Care – PPO | Admitting: Family Medicine

## 2021-08-20 ENCOUNTER — Ambulatory Visit (INDEPENDENT_AMBULATORY_CARE_PROVIDER_SITE_OTHER): Payer: BC Managed Care – PPO | Admitting: Psychology

## 2021-08-20 DIAGNOSIS — U071 COVID-19: Secondary | ICD-10-CM | POA: Diagnosis not present

## 2021-08-20 DIAGNOSIS — F339 Major depressive disorder, recurrent, unspecified: Secondary | ICD-10-CM

## 2021-08-20 NOTE — Progress Notes (Signed)
? ? ? ? ? ? ? ? ? ? ? ? ? ? ? ? ?Date: 08/20/2021  ?Treatment Plan ?Diagnosis ?296.33 (Major depressive affective disorder, recurrent episode, severe, without mention of psychotic behavior) [n/a]  ?F43.22 (Adjustment Disorder, With anxiety) [n/a]  ?Symptoms ?One partner engages in sexual behavior (e.g., penile-vaginal intercourse, oral sex, anal sex) that violates the explicit or implicit expectations of the relationship. (Status: maintained) -- No Description Entered  ?Medication Status ?compliance  ?Safety ?none  ?If Suicidal or Homicidal State Action Taken: unspecified  ?Current Risk: low ?Medications ?unspecified ?Objectives ?Related Problem: Partners verbally express empathy toward each other and demonstrate shared responsibility for reconstructing the relationship. ?Description: Clarify what level of trust exists for various aspects of the relationship. ?Target Date: 2022-05-27 ?Frequency: Daily ?Modality: individual ?Progress: 50% ? ?Related Problem: Partners verbally express empathy toward each other and demonstrate shared responsibility for reconstructing the relationship. ?Description: List relationship issues that contribute to dissatisfaction and unhappiness and agree to address these in future conjoint sessions. ?Target Date: 2022-05-27 ?Frequency: Daily ?Modality: individual ?Progress: 40%  ?Related Problem: Partners verbally express empathy toward each other and demonstrate shared responsibility for reconstructing the relationship. ?Description: Identify and list behavioral changes for self and partner that would enhance the relationship. ?Target Date: 2022-05-27 ?Frequency: Daily ?Modality: individual ?Progress: 40%  ?Related Problem: Partners verbally express empathy toward each other and demonstrate shared responsibility for reconstructing the relationship. ?Description: Describe pre-affair relationship functioning. ?Target Date: 2022-05-27 ?Frequency: Daily ?Modality: individual ?Progress:  50% ? ?Related Problem: Partners verbally express empathy toward each other and demonstrate shared responsibility for reconstructing the relationship. ?Description: Ask the partners to generate factors that they believe contributed to the affair. ?Target Date: 2022-05-27 ?Frequency: Daily ?Modality: individual ?Progress: 60%  ? Problem: Partners verbally express empathy toward each other and demonstrate shared responsibility for reconstructing the relationship. ?Description: Hurt partner uses anxiety-management techniques to deal with intrusive thoughts about the affair. ?Target Date: 2022-05-27 ?Frequency: Daily ?Modality: individual ?Progress: 40%  ?Client Response ?full compliance  ?Service Location ?Location, [address] [place], [place] [address]  ?Service Code ?cpt [id number]  ?Self care activities  ?Provide education, information  ?Emotion regulation skills  ?Validate/empathize  ?Identify/label emotions  ?Facilitate problem solving  ?Comments  ?Patient agrees to a video session due to the pandemic. They are at home and I am in my home office.  ?Dx.: Depression  ?Meds: Xanax. Tried antidep. meds, but she has significant side-effects. Goals: They are seeking counseling to resolve significant marital conflict secondary to infidelity. [name] has experienced symptoms of both anxiety and depression during this period and is hoping to reduce these symptoms as the relationship issues improve. [name] is experiencing some depressive symptoms as well, along with considerable anger and frustration. Goal date 12-23  ? ?[name]: ?[name] states that she is "okay". She says after the last session they argued most of the night. I reviewed my reasons for them to consider separation. She states they argue over little matters and she is confused why those small matters become "huge arguments". I shared that it is related to the many unresolved issues I their marriage. She still feels that [name] makes no effort to show her he  cares for her. In particular, he will not show her the same level of caring that he showed his girlfriend. She is sad that her husband has let her go for 10 months feeling inferior to his girlfriend. She feels "stuck in a hard place". She says the biggest change they  have realized is that the arguments are less. He does small things like holding hands and tells her he loves her. These efforts fall short for her because he has demonstrated he is willing to do more for the other woman. She acknowledges that she will have to be the one to move out because he will not make that move.      ? ?                           ? ?[name], Ph.D.  Time: 4:10p-5:00p 50 minutes ? ? ? ? ? ? ? ? ? ? ? ? ? ? ? ? ?

## 2021-08-27 ENCOUNTER — Ambulatory Visit (INDEPENDENT_AMBULATORY_CARE_PROVIDER_SITE_OTHER): Payer: BC Managed Care – PPO | Admitting: Psychology

## 2021-08-27 DIAGNOSIS — F339 Major depressive disorder, recurrent, unspecified: Secondary | ICD-10-CM
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
## 2021-08-30 ENCOUNTER — Other Ambulatory Visit (HOSPITAL_COMMUNITY): Payer: Self-pay

## 2021-09-01 ENCOUNTER — Other Ambulatory Visit (HOSPITAL_COMMUNITY): Payer: Self-pay

## 2021-09-01 ENCOUNTER — Encounter: Payer: Self-pay | Admitting: Family Medicine

## 2021-09-01 ENCOUNTER — Telehealth: Payer: Self-pay | Admitting: Family Medicine

## 2021-09-01 DIAGNOSIS — N762 Acute vulvitis: Secondary | ICD-10-CM | POA: Diagnosis not present

## 2021-09-01 MED ORDER — AMPHETAMINE-DEXTROAMPHET ER 30 MG PO CP24
30.0000 mg | ORAL_CAPSULE | ORAL | 0 refills | Status: DC
  Filled 2021-09-01: qty 30, 30d supply, fill #0

## 2021-09-01 NOTE — Telephone Encounter (Signed)
Message complete.   See my chart message ?

## 2021-09-01 NOTE — Telephone Encounter (Signed)
Done for 90 days

## 2021-09-01 NOTE — Telephone Encounter (Signed)
Pt is calling and pharm does have adderall 30 mg in stock  ?[facility] Phone:  [phone]  ?Fax:  [phone]  ?  ? ?

## 2021-09-06 ENCOUNTER — Other Ambulatory Visit (HOSPITAL_COMMUNITY): Payer: Self-pay

## 2021-09-08 ENCOUNTER — Encounter: Payer: Self-pay | Admitting: Family Medicine

## 2021-09-08 ENCOUNTER — Other Ambulatory Visit (HOSPITAL_COMMUNITY): Payer: Self-pay

## 2021-09-08 MED ORDER — HYDROMORPHONE HCL 8 MG PO TABS
8.0000 mg | ORAL_TABLET | Freq: Every day | ORAL | 0 refills | Status: AC | PRN
  Filled 2021-09-08: qty 30, 30d supply, fill #0

## 2021-09-08 MED ORDER — OXYCODONE HCL 15 MG PO TABS
15.0000 mg | ORAL_TABLET | ORAL | 0 refills | Status: DC | PRN
  Filled 2021-09-08: qty 180, 30d supply, fill #0

## 2021-09-08 NOTE — Telephone Encounter (Signed)
Done

## 2021-09-10 ENCOUNTER — Ambulatory Visit: Payer: BC Managed Care – PPO | Admitting: Psychology
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
## 2021-09-17 ENCOUNTER — Ambulatory Visit: Payer: BC Managed Care – PPO | Admitting: Internal Medicine

## 2021-09-18 ENCOUNTER — Other Ambulatory Visit: Payer: Self-pay | Admitting: Family Medicine

## 2021-09-18 NOTE — Telephone Encounter (Signed)
Last OV- 08/04/2021 ?Last refill- 05/07/2021-1 tab, 5 refills ? ?No future OV scheduled.  ? ? ?

## 2021-09-20 DIAGNOSIS — U071 COVID-19: Secondary | ICD-10-CM | POA: Diagnosis not present

## 2021-09-24 ENCOUNTER — Ambulatory Visit: Payer: BC Managed Care – PPO | Admitting: Psychology

## 2021-09-24 DIAGNOSIS — F4322 Adjustment disorder with anxiety: Secondary | ICD-10-CM | POA: Diagnosis not present
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
## 2021-10-01 ENCOUNTER — Ambulatory Visit: Payer: BC Managed Care – PPO | Admitting: Internal Medicine

## 2021-10-02 ENCOUNTER — Other Ambulatory Visit: Payer: Self-pay | Admitting: Family Medicine

## 2021-10-02 ENCOUNTER — Other Ambulatory Visit (HOSPITAL_COMMUNITY): Payer: Self-pay

## 2021-10-02 MED ORDER — AMPHETAMINE-DEXTROAMPHET ER 30 MG PO CP24
30.0000 mg | ORAL_CAPSULE | ORAL | 0 refills | Status: DC
  Filled 2021-10-02: qty 30, 30d supply, fill #0

## 2021-10-02 NOTE — Telephone Encounter (Signed)
Last refill-09/01/21-30 cap, 0 refills ?Last OV- 08/04/21 ? ?No future OV scheduled. ?

## 2021-10-02 NOTE — Telephone Encounter (Signed)
Done

## 2021-10-04 ENCOUNTER — Other Ambulatory Visit: Payer: Self-pay | Admitting: Family Medicine

## 2021-10-06 ENCOUNTER — Other Ambulatory Visit (HOSPITAL_COMMUNITY): Payer: Self-pay

## 2021-10-06 MED ORDER — OXYCODONE HCL 15 MG PO TABS
15.0000 mg | ORAL_TABLET | ORAL | 0 refills | Status: DC | PRN
  Filled 2021-10-06: qty 180, 30d supply, fill #0

## 2021-10-06 NOTE — Telephone Encounter (Signed)
Pt LOV was on  08/04/2021 ?Last refill done on 09/08/2021 for 180 tablets, pt takes every 4 hrs  ?Please advise ?

## 2021-10-08 ENCOUNTER — Ambulatory Visit (INDEPENDENT_AMBULATORY_CARE_PROVIDER_SITE_OTHER): Payer: BC Managed Care – PPO | Admitting: Psychology

## 2021-10-08 DIAGNOSIS — F339 Major depressive disorder, recurrent, unspecified: Secondary | ICD-10-CM | POA: Diagnosis not present
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
## 2021-10-13 ENCOUNTER — Ambulatory Visit (INDEPENDENT_AMBULATORY_CARE_PROVIDER_SITE_OTHER): Payer: BC Managed Care – PPO | Admitting: Psychology

## 2021-10-13 DIAGNOSIS — F339 Major depressive disorder, recurrent, unspecified: Secondary | ICD-10-CM | POA: Diagnosis not present
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
## 2021-10-20 ENCOUNTER — Encounter: Payer: Self-pay | Admitting: Family Medicine

## 2021-10-20 ENCOUNTER — Ambulatory Visit: Payer: BC Managed Care – PPO | Admitting: Family Medicine

## 2021-10-20 ENCOUNTER — Other Ambulatory Visit: Payer: Self-pay | Admitting: Family Medicine

## 2021-10-20 ENCOUNTER — Other Ambulatory Visit (HOSPITAL_COMMUNITY): Payer: Self-pay

## 2021-10-20 VITALS — BP 138/86 | HR 102 | Temp 99.2°F | Ht 69.5 in | Wt 255.0 lb

## 2021-10-20 DIAGNOSIS — U071 COVID-19: Secondary | ICD-10-CM | POA: Diagnosis not present

## 2021-10-20 DIAGNOSIS — E282 Polycystic ovarian syndrome: Secondary | ICD-10-CM | POA: Insufficient documentation

## 2021-10-20 DIAGNOSIS — R739 Hyperglycemia, unspecified: Secondary | ICD-10-CM

## 2021-10-20 DIAGNOSIS — E669 Obesity, unspecified: Secondary | ICD-10-CM

## 2021-10-20 LAB — POCT GLYCOSYLATED HEMOGLOBIN (HGB A1C): Hemoglobin A1C: 5.6 % (ref 4.0–5.6)

## 2021-10-20 MED ORDER — TIRZEPATIDE 2.5 MG/0.5ML ~~LOC~~ SOAJ
2.5000 mg | SUBCUTANEOUS | 2 refills | Status: DC
Start: 2021-10-20 — End: 2021-11-07
  Filled 2021-10-20 – 2021-11-05 (×3): qty 2, 28d supply, fill #0

## 2021-10-20 NOTE — Addendum Note (Signed)
Addended by: Carola Rhine on: 10/20/2021 12:06 PM   Modules accepted: Orders

## 2021-10-20 NOTE — Progress Notes (Signed)
   Subjective:    Patient ID: [name], female    DOB: Oct 09, 1975, 46 y.o.   MRN: [id number]  HPI Here asking for help with losing weight. She has been overweight her entire adult life, and her family hx is positive for obesity. She has known PCOS, and this is usually associated with insulin resistance. She watches her diet closely and she tries to do low level exercises (her back pain limits this however). Her BMI is 37.12. She has had elevated blood glucose readings in the past, but I cannot find any A1c values in her chart over the past few years.    Review of Systems  Constitutional: Negative.   Respiratory: Negative.    Cardiovascular: Negative.   Musculoskeletal:  Positive for back pain and myalgias.      Objective:   Physical Exam Constitutional:      General: She is not in acute distress.    Appearance: She is obese.  Cardiovascular:     Rate and Rhythm: Normal rate and regular rhythm.     Pulses: Normal pulses.     Heart sounds: Normal heart sounds.  Pulmonary:     Effort: Pulmonary effort is normal.     Breath sounds: Normal breath sounds.  Neurological:     Mental Status: She is alert.          Assessment & Plan:  She is obese, and insulin resistance no doubt plays a large role in this. She will try Mounjaro 2.5 mg weekly for 4 weeks, and we will follow up after that.  Gershon Crane, MD

## 2021-10-22 ENCOUNTER — Ambulatory Visit (INDEPENDENT_AMBULATORY_CARE_PROVIDER_SITE_OTHER): Payer: BC Managed Care – PPO | Admitting: Psychology

## 2021-10-22 DIAGNOSIS — F339 Major depressive disorder, recurrent, unspecified: Secondary | ICD-10-CM | POA: Diagnosis not present

## 2021-10-22 NOTE — Progress Notes (Signed)
Date: 10/22/2021  Treatment Plan Diagnosis 296.33 (Major depressive affective disorder, recurrent episode, severe, without mention of psychotic behavior) [n/a]  F43.22 (Adjustment Disorder, With anxiety) [n/a]  Symptoms One partner engages in sexual behavior (e.g., penile-vaginal intercourse, oral sex, anal sex) that violates the explicit or implicit expectations of the relationship. (Status: maintained) -- No Description Entered  Medication Status compliance  Safety none  If Suicidal or Homicidal State Action Taken: unspecified  Current Risk: low Medications unspecified Objectives Related Problem: Partners verbally express empathy toward each other and demonstrate shared responsibility for reconstructing the relationship. Description: Clarify what level of trust exists for various aspects of the relationship. Target Date: 2022-05-27 Frequency: Daily Modality: individual Progress: 50%  Related Problem: Partners verbally express empathy toward each other and demonstrate shared responsibility for reconstructing the relationship. Description: List relationship issues that contribute to dissatisfaction and unhappiness and agree to address these in future conjoint sessions. Target Date: 2022-05-27 Frequency: Daily Modality: individual Progress: 40%  Related Problem: Partners verbally express empathy toward each other and demonstrate shared responsibility for reconstructing the relationship. Description: Identify and list behavioral changes for self and partner that would enhance the relationship. Target Date: 2022-05-27 Frequency: Daily Modality: individual Progress: 40%  Related Problem: Partners verbally express empathy toward each other and demonstrate shared responsibility for reconstructing the relationship. Description: Describe pre-affair relationship functioning. Target Date: 2022-05-27 Frequency: Daily Modality: individual Progress: 50%  Related  Problem: Partners verbally express empathy toward each other and demonstrate shared responsibility for reconstructing the relationship. Description: Ask the partners to generate factors that they believe contributed to the affair. Target Date: 2022-05-27 Frequency: Daily Modality: individual Progress: 60%   Problem: Partners verbally express empathy toward each other and demonstrate shared responsibility for reconstructing the relationship. Description: Hurt partner uses anxiety-management techniques to deal with intrusive thoughts about the affair. Target Date: 2022-05-27 Frequency: Daily Modality: individual Progress: 40%  Client Response full compliance  Service Location Location, [address] [place], [place] [address]  Service Code cpt [phone]  Self care activities  Provide education, information  Emotion regulation skills  Validate/empathize  Identify/label emotions  Facilitate problem solving  Comments  Patient agrees to a video session due to the pandemic. They are at home and I am in my home office.  Dx.: Depression  Meds: Xanax. Tried antidep. meds, but she has significant side-effects. Goals: They are seeking counseling to resolve significant marital conflict secondary to infidelity. [name] has experienced symptoms of both anxiety and depression during this period and is hoping to reduce these symptoms as the relationship issues improve. [name] is experiencing some depressive symptoms as well, along with considerable anger and frustration. Goal date 12-23   [name]: She talked about moving beyond the need to follow husband everywhere to make sure he is not cheating. She talked about her conflict with [name] last week when he told her that he wants her to "get out". He ended up telling her she is the "love of his lifetime". When he told her to leave, she says she could not go because of her neuropathy. He told her that she can stay the night, but the next day she ended up  staying and they had a good talk. He is requesting individual therapy to address a few issues. He requested to see me but I told him he needs to see his own counselor independent of the marital counseling. He understands and will seek another counselor. We  talked about the need for [name] to understand [name]'s insecurities and be more tolerant.                                                   [name], Ph.D.  Time: 5:15a-6:00p 45 minutes

## 2021-10-28 ENCOUNTER — Encounter: Payer: Self-pay | Admitting: Family Medicine

## 2021-10-28 ENCOUNTER — Other Ambulatory Visit (HOSPITAL_COMMUNITY): Payer: Self-pay

## 2021-10-29 ENCOUNTER — Other Ambulatory Visit (HOSPITAL_COMMUNITY): Payer: Self-pay

## 2021-10-31 ENCOUNTER — Other Ambulatory Visit (HOSPITAL_COMMUNITY): Payer: Self-pay

## 2021-10-31 ENCOUNTER — Ambulatory Visit: Payer: BC Managed Care – PPO | Admitting: Psychology

## 2021-11-03 ENCOUNTER — Other Ambulatory Visit: Payer: Self-pay | Admitting: Family Medicine

## 2021-11-03 ENCOUNTER — Other Ambulatory Visit (HOSPITAL_COMMUNITY): Payer: Self-pay

## 2021-11-03 ENCOUNTER — Ambulatory Visit (INDEPENDENT_AMBULATORY_CARE_PROVIDER_SITE_OTHER): Payer: BC Managed Care – PPO | Admitting: Family Medicine

## 2021-11-03 ENCOUNTER — Encounter: Payer: Self-pay | Admitting: Family Medicine

## 2021-11-03 ENCOUNTER — Telehealth: Payer: Self-pay | Admitting: Family Medicine

## 2021-11-03 VITALS — BP 112/82 | HR 98 | Temp 98.6°F | Ht 69.5 in | Wt 260.0 lb

## 2021-11-03 DIAGNOSIS — Z0000 Encounter for general adult medical examination without abnormal findings: Secondary | ICD-10-CM

## 2021-11-03 LAB — CBC WITH DIFFERENTIAL/PLATELET
Basophils Absolute: 0 10*3/uL (ref 0.0–0.1)
Basophils Relative: 0.5 % (ref 0.0–3.0)
Eosinophils Absolute: 0.2 10*3/uL (ref 0.0–0.7)
Eosinophils Relative: 2.2 % (ref 0.0–5.0)
HCT: 43.5 % (ref 36.0–46.0)
Hemoglobin: 14.7 g/dL (ref 12.0–15.0)
Lymphocytes Relative: 41.6 % (ref 12.0–46.0)
Lymphs Abs: 3.3 10*3/uL (ref 0.7–4.0)
MCHC: 33.8 g/dL (ref 30.0–36.0)
MCV: 85.5 fl (ref 78.0–100.0)
Monocytes Absolute: 0.5 10*3/uL (ref 0.1–1.0)
Monocytes Relative: 5.9 % (ref 3.0–12.0)
Neutro Abs: 4 10*3/uL (ref 1.4–7.7)
Neutrophils Relative %: 49.8 % (ref 43.0–77.0)
Platelets: 290 10*3/uL (ref 150.0–400.0)
RBC: 5.09 Mil/uL (ref 3.87–5.11)
RDW: 14 % (ref 11.5–15.5)
WBC: 8 10*3/uL (ref 4.0–10.5)

## 2021-11-03 LAB — LIPID PANEL
Cholesterol: 232 mg/dL — ABNORMAL HIGH (ref 0–200)
HDL: 39.5 mg/dL (ref >39.00)
LDL Cholesterol: 159 mg/dL — ABNORMAL HIGH (ref 0–99)
NonHDL: 192.99
Total CHOL/HDL Ratio: 6
Triglycerides: 171 mg/dL — ABNORMAL HIGH (ref 0.0–149.0)
VLDL: 34.2 mg/dL (ref 0.0–40.0)

## 2021-11-03 LAB — BASIC METABOLIC PANEL
BUN: 14 mg/dL (ref 6–23)
CO2: 30 mEq/L (ref 19–32)
Calcium: 9 mg/dL (ref 8.4–10.5)
Chloride: 101 mEq/L (ref 96–112)
Creatinine, Ser: 0.9 mg/dL (ref 0.40–1.20)
GFR: 76.23 mL/min (ref >60.00)
Glucose, Bld: 111 mg/dL — ABNORMAL HIGH (ref 70–99)
Potassium: 3.6 mEq/L (ref 3.5–5.1)
Sodium: 140 mEq/L (ref 135–145)

## 2021-11-03 LAB — HEPATIC FUNCTION PANEL
ALT: 15 U/L (ref 0–35)
AST: 15 U/L (ref 0–37)
Albumin: 3.9 g/dL (ref 3.5–5.2)
Alkaline Phosphatase: 65 U/L (ref 39–117)
Bilirubin, Direct: 0.1 mg/dL (ref 0.0–0.3)
Total Bilirubin: 0.4 mg/dL (ref 0.2–1.2)
Total Protein: 6.9 g/dL (ref 6.0–8.3)

## 2021-11-03 LAB — TSH: TSH: 1.89 u[IU]/mL (ref 0.35–5.50)

## 2021-11-03 LAB — VITAMIN B12: Vitamin B-12: 1328 pg/mL — ABNORMAL HIGH (ref 211–911)

## 2021-11-03 LAB — VITAMIN D 25 HYDROXY (VIT D DEFICIENCY, FRACTURES): VITD: 28.04 ng/mL — ABNORMAL LOW (ref 30.00–100.00)

## 2021-11-03 MED ORDER — AMPHETAMINE-DEXTROAMPHET ER 30 MG PO CP24: 30.0000 mg | ORAL_CAPSULE | Freq: Every day | ORAL | 0 refills | Status: DC

## 2021-11-03 MED ORDER — AMPHETAMINE-DEXTROAMPHET ER 30 MG PO CP24
30.0000 mg | ORAL_CAPSULE | Freq: Every day | ORAL | 0 refills | Status: DC
  Filled 2021-11-03: qty 30, 30d supply, fill #0

## 2021-11-03 MED ORDER — CYCLOBENZAPRINE HCL 10 MG PO TABS
10.0000 mg | ORAL_TABLET | Freq: Three times a day (TID) | ORAL | 5 refills | Status: AC | PRN
Start: 2021-11-03 — End: ?
  Filled 2021-11-03: qty 90, 30d supply, fill #0
  Filled 2022-03-05: qty 90, 30d supply, fill #1
  Filled 2022-10-18: qty 90, 30d supply, fill #2

## 2021-11-03 MED ORDER — GABAPENTIN 100 MG PO CAPS
100.0000 mg | ORAL_CAPSULE | Freq: Three times a day (TID) | ORAL | 1 refills | Status: DC
  Filled 2021-11-03: qty 90, 30d supply, fill #0

## 2021-11-03 MED ORDER — GABAPENTIN 300 MG PO CAPS
300.0000 mg | ORAL_CAPSULE | Freq: Three times a day (TID) | ORAL | 1 refills | Status: DC
  Filled 2021-11-03: qty 90, 30d supply, fill #0
  Filled 2022-01-07: qty 90, 30d supply, fill #1
  Filled 2022-03-05: qty 90, 30d supply, fill #2
  Filled 2022-04-30: qty 90, 30d supply, fill #3
  Filled 2022-06-30: qty 90, 30d supply, fill #4
  Filled 2022-08-12: qty 90, 30d supply, fill #5

## 2021-11-03 NOTE — Telephone Encounter (Signed)
Patient called because she needs a new prescription on her amphetamine-dextroamphetamine (ADDERALL XR) 30 MG 24 hr capsule. Patient states that pharmacy stated that when prescription was sent in something was canceled out and now she has no more refills because it was discontinued. If more information is need pharmacy can be contacted at number below.     Please send refill to [facility] Phone:  [phone]  Fax:  [phone]     Phone:  [phone]       Please advise

## 2021-11-03 NOTE — Addendum Note (Signed)
Addended by: Gershon Crane A on: 11/03/2021 01:31 PM   Modules accepted: Orders

## 2021-11-03 NOTE — Progress Notes (Signed)
   Subjective:    Patient ID: [name], female    DOB: 23-Jun-1975, 46 y.o.   MRN: [id number]  HPI Here for a well exam. She is doing well in general. She still deals with chronic back pain. She asks [place] to sent in a muscle relaxer again. We ar in the process of trying to get approval for her to take [facility].    Review of Systems  Constitutional: Negative.   HENT: Negative.    Eyes: Negative.   Respiratory: Negative.    Cardiovascular: Negative.   Gastrointestinal: Negative.   Genitourinary:  Negative for decreased urine volume, difficulty urinating, dyspareunia, dysuria, enuresis, flank pain, frequency, hematuria, pelvic pain and urgency.  Musculoskeletal:  Positive for back pain.  Skin: Negative.   Neurological: Negative.  Negative for headaches.  Psychiatric/Behavioral: Negative.        Objective:   Physical Exam Constitutional:      General: She is not in acute distress.    Appearance: She is well-developed. She is obese.  HENT:     Head: Normocephalic and atraumatic.     Right Ear: External ear normal.     Left Ear: External ear normal.     Nose: Nose normal.     Mouth/Throat:     Pharynx: No oropharyngeal exudate.  Eyes:     General: No scleral icterus.    Conjunctiva/sclera: Conjunctivae normal.     Pupils: Pupils are equal, round, and reactive to light.  Neck:     Thyroid: No thyromegaly.     Vascular: No JVD.  Cardiovascular:     Rate and Rhythm: Normal rate and regular rhythm.     Heart sounds: Normal heart sounds. No murmur heard.   No friction rub. No gallop.  Pulmonary:     Effort: Pulmonary effort is normal. No respiratory distress.     Breath sounds: Normal breath sounds. No wheezing or rales.  Chest:     Chest wall: No tenderness.  Abdominal:     General: Bowel sounds are normal. There is no distension.     Palpations: Abdomen is soft. There is no mass.     Tenderness: There is no abdominal tenderness. There is no guarding or rebound.   Musculoskeletal:        General: No tenderness. Normal range of motion.     Cervical back: Normal range of motion and neck supple.  Lymphadenopathy:     Cervical: No cervical adenopathy.  Skin:    General: Skin is warm and dry.     Findings: No erythema or rash.  Neurological:     Mental Status: She is alert and oriented to person, place, and time.     Cranial Nerves: No cranial nerve deficit.     Motor: No abnormal muscle tone.     Coordination: Coordination normal.     Deep Tendon Reflexes: Reflexes are normal and symmetric. Reflexes normal.  Psychiatric:        Behavior: Behavior normal.        Thought Content: Thought content normal.        Judgment: Judgment normal.          Assessment & Plan:  Well exam. We discussed diet and exercise. Get fasting labs.  Gershon Crane, MD

## 2021-11-03 NOTE — Telephone Encounter (Signed)
I took care of this.

## 2021-11-03 NOTE — Progress Notes (Signed)
.  h 

## 2021-11-04 ENCOUNTER — Other Ambulatory Visit (HOSPITAL_COMMUNITY): Payer: Self-pay

## 2021-11-04 ENCOUNTER — Encounter: Payer: Self-pay | Admitting: Family Medicine

## 2021-11-04 ENCOUNTER — Telehealth (INDEPENDENT_AMBULATORY_CARE_PROVIDER_SITE_OTHER): Payer: BC Managed Care – PPO | Admitting: Family Medicine

## 2021-11-04 ENCOUNTER — Telehealth: Payer: Self-pay

## 2021-11-04 DIAGNOSIS — M48061 Spinal stenosis, lumbar region without neurogenic claudication: Secondary | ICD-10-CM | POA: Diagnosis not present

## 2021-11-04 DIAGNOSIS — F1190 Opioid use, unspecified, uncomplicated: Secondary | ICD-10-CM

## 2021-11-04 MED ORDER — HYDROMORPHONE HCL 8 MG PO TABS: 8.0000 mg | ORAL_TABLET | Freq: Every day | ORAL | 0 refills | Status: DC | PRN

## 2021-11-04 MED ORDER — OXYCODONE HCL 15 MG PO TABS: 15.0000 mg | ORAL_TABLET | ORAL | 0 refills | Status: DC | PRN

## 2021-11-04 MED ORDER — LIDOCAINE 5 % EX PTCH
1.0000 | MEDICATED_PATCH | CUTANEOUS | 5 refills | Status: AC
  Filled 2021-11-04: qty 30, 30d supply, fill #0

## 2021-11-04 NOTE — Telephone Encounter (Signed)
Pt PA for Greggory Keen was sent to her plan fro approval on 11/04/21. [employer] is reviewing your PA request and will respond within 24 hours for Medicaid or up to 72 hours for non-Medicaid plans, based on the required timeframe determined by state or federal regulations. To check for an update later, open this request from your dashboard

## 2021-11-04 NOTE — Progress Notes (Signed)
Subjective:    Patient ID: [name], female    DOB: March 08, 1976, 46 y.o.   MRN: [id number]  HPI Virtual Visit via Video Note  I connected with the patient on 11/04/21 at  4:00 PM EDT by a video enabled telemedicine application and verified that I am speaking with the correct person using two identifiers.  Location patient: home Location provider:work or home office Persons participating in the virtual visit: patient, provider or pain management.  I discussed the limitations of evaluation and management by telemedicine and the availability of in person appointments. The patient expressed understanding and agreed to proceed.   HPI: Here for pain management. Her pain is about the same, and she is pleased with how the oral medications are working. However she asks if she can also use Lidocaine patches as needed.   ROS: See pertinent positives and negatives per HPI.  Past Medical History:  Diagnosis Date   Allergy    Anxiety    Arthritis    B12 deficiency    Chronic narcotic use    Depression    not currently    Dysrhythmia    hx tachy-was from medication nortriptylline   Esophagitis    rx   Fibromyalgia    GERD (gastroesophageal reflux disease)    History of echocardiogram    Echo 4/18: Mild concentric LVH, vigorous LVF, EF 65-70, normal wall motion, grade 1 diastolic dysfunction   History of kidney stones    Hyperlipidemia    Intervertebral disc protrusion 01/11/2014   Migraine    Obesity (BMI 35.0-39.9 without comorbidity)    Peripheral neuropathy    Polycystic ovaries    Pulmonary embolus ([place])    Spinal stenosis, lumbar    Vitamin D deficiency     Past Surgical History:  Procedure Laterality Date   70 HOUR [place] STUDY N/A 05/27/2016   Procedure: 24 HOUR [place] STUDY;  Surgeon: Manus Gunning, MD;  Location: [facility];  Service: Gastroenterology;  Laterality: N/A;   ABDOMINAL EXPOSURE N/A 05/04/2018   Procedure: ABDOMINAL EXPOSURE;  Surgeon: Rosetta Posner, MD;  Location: [place];  Service: Vascular;  Laterality: N/A;   ANTERIOR LUMBAR FUSION N/A 05/04/2018   Procedure: Lumbar Five Sacral One Anterior lumbar interbody fusion;  Surgeon: Kary Kos, MD;  Location: [place];  Service: Neurosurgery;  Laterality: N/A;  Lumbar Five Sacral One Anterior lumbar interbody fusion   APPENDECTOMY     BACK SURGERY  2012   left laminectomy at L3-4 per Dr. Luiz Ochoa    bladder tack  2012   CHOLECYSTECTOMY N/A 10/03/2015   Procedure: LAPAROSCOPIC CHOLECYSTECTOMY;  Surgeon: Mickeal Skinner, MD;  Location: [place];  Service: General;  Laterality: N/A;   ESOPHAGEAL MANOMETRY N/A 05/27/2016   Procedure: ESOPHAGEAL MANOMETRY (EM);  Surgeon: Manus Gunning, MD;  Location: [facility];  Service: Gastroenterology;  Laterality: N/A;   EXCISION OF SKIN TAG  10/03/2015   Procedure: EXCISION OF SKIN TAG;  Surgeon: Arta Bruce Kinsinger, MD;  Location: [place];  Service: General;;   LUMBAR [place]  01-31-14   per Dr. Saintclair Halsted    OOPHORECTOMY     left o[place]   OVARIAN CYST REMOVAL     RECTOCELE REPAIR     SINUSOTOMY  12/14/06   spinal fusion  2016/10   TONSILLECTOMY     TONSILLECTOMY     VAGINAL HYSTERECTOMY     WRIST GANGLION EXCISION Left     Family History  Problem Relation Age  of Onset   Fibromyalgia Mother    Diabetes Mother    Thyroid cancer Mother    Liver disease Mother    Neuropathy Mother    Cirrhosis Mother        non-alcoholic   Pulmonary embolism Mother        both lungs   Heart failure Mother    Hypertension Father    Diabetes Father    Heart disease Father    Prostate cancer Father    Heart attack Father        x 2   Colon cancer Neg Hx    Other Neg Hx        pheochromocytoma   Colon polyps Neg Hx      Current Outpatient Medications:    ALPRAZolam (XANAX) 0.5 MG tablet, TAKE 1 TABLET BY MOUTH THREE TIMES A DAY AS NEEDED, Disp: 90 tablet, Rfl: 5   amphetamine-dextroamphetamine (ADDERALL XR) 30 MG 24 hr capsule, Take 1 capsule (30 mg  total) by mouth daily., Disp: 30 capsule, Rfl: 0   B-D 3CC LUER-LOK SYR 25GX1" 25G X 1" 3 ML MISC, USE AS DIRECTED, Disp: 9 each, Rfl: 2   cetirizine (ZYRTEC) 10 MG tablet, Take 10 mg by mouth daily., Disp: , Rfl:    clobetasol (TEMOVATE) 0.05 % external solution, clobetasol 0.05 % scalp solution, Disp: , Rfl:    CYANOCOBALAMIN IJ, Inject as directed once a week., Disp: , Rfl:    cyclobenzaprine (FLEXERIL) 10 MG tablet, Take 1 tablet (10 mg total) by mouth 3 (three) times daily as needed for muscle spasms., Disp: 90 tablet, Rfl: 5   esomeprazole (NEXIUM) 40 MG capsule, TAKE 1 CAPSULE BY MOUTH EVERY DAY, Disp: 90 capsule, Rfl: 0   famotidine (PEPCID) 40 MG tablet, Take 1 tablet (40 mg total) by mouth at bedtime., Disp: 90 tablet, Rfl: 1   fluconazole (DIFLUCAN) 150 MG tablet, Take 1 tablet (150 mg total) by mouth daily. -For your yeast infection, start the Diflucan (fluconazole)- Take one pill today (day 1). If you're still having symptoms in 3 days, take the second pill. (Patient taking differently: Take 150 mg by mouth as needed. -For your yeast infection, start the Diflucan (fluconazole)- Take one pill today (day 1). If you're still having symptoms in 3 days, take the second pill.), Disp: 2 tablet, Rfl: 0   Fluocinolone Acetonide Body 0.01 % OIL, fluocinolone 0.01 % scalp oil and shower cap, Disp: , Rfl:    fluticasone (FLONASE) 50 MCG/ACT nasal spray, Place 2 sprays into both nostrils daily., Disp: 16 g, Rfl: 6   furosemide (LASIX) 20 MG tablet, Take 2 tablets by mouth twice daily as needed for fluid., Disp: 90 tablet, Rfl: 1   gabapentin (NEURONTIN) 100 MG capsule, Take 1 capsule (100 mg total) by mouth 3 (three) times daily., Disp: 270 capsule, Rfl: 1   gabapentin (NEURONTIN) 300 MG capsule, Take 1 capsule (300 mg total) by mouth 3 (three) times daily., Disp: 270 capsule, Rfl: 1   hydrochlorothiazide (HYDRODIURIL) 25 MG tablet, TAKE 1 TABLET BY MOUTH EVERY DAY, Disp: 90 tablet, Rfl: 1   Multiple  Vitamins-Minerals (MULTI-VITAMIN GUMMIES PO), Take 2 tablets by mouth daily., Disp: , Rfl:    oxyCODONE (ROXICODONE) 15 MG immediate release tablet, Take 1 tablet (15 mg total) by mouth every 4 (four) hours as needed for pain., Disp: 180 tablet, Rfl: 0   promethazine (PHENERGAN) 25 MG tablet, TAKE 1 TABLET BY MOUTH EVERY 6 HOURS AS NEEDED FOR NAUSEA (Patient  taking differently: Take 25 mg by mouth every 6 (six) hours as needed for nausea or vomiting.), Disp: 90 tablet, Rfl: 1   sucralfate (CARAFATE) 1 g tablet, Take 1 tablet (1 g total) by mouth 4 (four) times daily. (Patient taking differently: Take 1 g by mouth daily.), Disp: 120 tablet, Rfl: 2   SUMAtriptan (IMITREX) 100 MG tablet, Take 1 tablet earliest onset of migraine.  May repeat once in 2 hours if headache persists or recurs. (Patient taking differently: Take 100 mg by mouth every 2 (two) hours as needed for migraine.), Disp: 10 tablet, Rfl: 2   Vitamin D, Ergocalciferol, (DRISDOL) 1.25 MG (50000 UNIT) CAPS capsule, Take 1 capsule (50,000 Units total) by mouth every 7 (seven) days., Disp: 12 capsule, Rfl: 3   tirzepatide (MOUNJARO) 2.5 MG/0.5ML Pen, Inject 2.5 mg into the skin once a week. (Patient not taking: Reported on 11/04/2021), Disp: 2 mL, Rfl: 2  EXAM:  VITALS per patient if applicable:  GENERAL: alert, oriented, appears well and in no acute distress  HEENT: atraumatic, conjunttiva clear, no obvious abnormalities on inspection of external nose and ears  NECK: normal movements of the head and neck  LUNGS: on inspection no signs of respiratory distress, breathing rate appears normal, no obvious gross SOB, gasping or wheezing  CV: no obvious cyanosis  MS: moves all visible extremities without noticeable abnormality  PSYCH/NEURO: pleasant and cooperative, no obvious depression or anxiety, speech and thought processing grossly intact  ASSESSMENT AND PLAN: Pain management. Indication for chronic opioid: low back pain Medication  and dose: Oxycodone 15 mg and Dilaudid 8 mg # pills per month: 180 and 30 Last UDS date: 08-04-21 Opioid Treatment Agreement signed (Y/N): 04-17-19 Opioid Treatment Agreement last reviewed with patient:  11-04-21 [facility] reviewed this encounter (include red flags): Yes Meds were refilled. We will also add Lidocaine patches to use as needed. Alysia Penna, MD  Discussed the following assessment and plan:  No diagnosis found.     I discussed the assessment and treatment plan with the patient. The patient was provided an opportunity to ask questions and all were answered. The patient agreed with the plan and demonstrated an understanding of the instructions.   The patient was advised to call back or seek an in-person evaluation if the symptoms worsen or if the condition fails to improve as anticipated.    Here f    Review of Systems     Objective:   Physical Exam        Assessment & Plan:

## 2021-11-04 NOTE — Telephone Encounter (Signed)
Rx refilled today.

## 2021-11-05 ENCOUNTER — Ambulatory Visit (INDEPENDENT_AMBULATORY_CARE_PROVIDER_SITE_OTHER): Payer: BC Managed Care – PPO | Admitting: Psychology

## 2021-11-05 ENCOUNTER — Other Ambulatory Visit (HOSPITAL_COMMUNITY): Payer: Self-pay

## 2021-11-05 DIAGNOSIS — F339 Major depressive disorder, recurrent, unspecified: Secondary | ICD-10-CM

## 2021-11-05 NOTE — Progress Notes (Signed)
Date: 11/05/2021  Treatment Plan Diagnosis 296.33 (Major depressive affective disorder, recurrent episode, severe, without mention of psychotic behavior) [n/a]  F43.22 (Adjustment Disorder, With anxiety) [n/a]  Symptoms One partner engages in sexual behavior (e.g., penile-vaginal intercourse, oral sex, anal sex) that violates the explicit or implicit expectations of the relationship. (Status: maintained) -- No Description Entered  Medication Status compliance  Safety none  If Suicidal or Homicidal State Action Taken: unspecified  Current Risk: low Medications unspecified Objectives Related Problem: Partners verbally express empathy toward each other and demonstrate shared responsibility for reconstructing the relationship. Description: Clarify what level of trust exists for various aspects of the relationship. Target Date: 2022-05-27 Frequency: Daily Modality: individual Progress: 50%  Related Problem: Partners verbally express empathy toward each other and demonstrate shared responsibility for reconstructing the relationship. Description: List relationship issues that contribute to dissatisfaction and unhappiness and agree to address these in future conjoint sessions. Target Date: 2022-05-27 Frequency: Daily Modality: individual Progress: 40%  Related Problem: Partners verbally express empathy toward each other and demonstrate shared responsibility for reconstructing the relationship. Description: Identify and list behavioral changes for self and partner that would enhance the relationship. Target Date: 2022-05-27 Frequency: Daily Modality: individual Progress: 40%  Related Problem: Partners verbally express empathy toward each other and demonstrate shared responsibility for reconstructing the relationship. Description: Describe pre-affair relationship functioning. Target Date: 2022-05-27 Frequency: Daily Modality: individual Progress: 50%  Related Problem: Partners  verbally express empathy toward each other and demonstrate shared responsibility for reconstructing the relationship. Description: Ask the partners to generate factors that they believe contributed to the affair. Target Date: 2022-05-27 Frequency: Daily Modality: individual Progress: 60%   Problem: Partners verbally express empathy toward each other and demonstrate shared responsibility for reconstructing the relationship. Description: Hurt partner uses anxiety-management techniques to deal with intrusive thoughts about the affair. Target Date: 2022-05-27 Frequency: Daily Modality: individual Progress: 40%  Client Response full compliance  Service Location Location, [address] [place], [place] [address]  Service Code cpt [phone]  Self care activities  Provide education, information  Emotion regulation skills  Validate/empathize  Identify/label emotions  Facilitate problem solving  Comments  Patient agrees to a video session due to the pandemic. They are at home and I am in my home office.  Dx.: Depression  Meds: Xanax. Tried antidep. meds, but she has significant side-effects. Goals: They are seeking counseling to resolve significant marital conflict secondary to infidelity. [name] has experienced symptoms of both anxiety and depression during this period and is hoping to reduce these symptoms as the relationship issues improve. [name] is experiencing some depressive symptoms as well, along with considerable anger and frustration. Goal date 12-23   [name]: She states that she is feeling good and that she and [name] have not had any major "blow ups". She does say that there are a lot of things bothering her, including grief over her mother, changes in the relationship with the kids (since the affair). She thinks the kids are upset that she is giving [name] an opportunity to stay in the marriage. Challenged [name] to share feeling of affection or love to [name] and test his [name] that it  will result in a negative interaction. If he is unable, then we (he) needs to explore his inability or resistance to share positive feelings towards her. I will send names of possible therapists for him.  [name], Ph.D.  Time: 5:15a-6:00p 45 minutes

## 2021-11-06 ENCOUNTER — Telehealth: Payer: Self-pay

## 2021-11-07 ENCOUNTER — Other Ambulatory Visit: Payer: Self-pay

## 2021-11-07 ENCOUNTER — Encounter: Payer: Self-pay | Admitting: Family Medicine

## 2021-11-07 ENCOUNTER — Other Ambulatory Visit (HOSPITAL_COMMUNITY): Payer: Self-pay

## 2021-11-07 MED ORDER — ATORVASTATIN CALCIUM 20 MG PO TABS
20.0000 mg | ORAL_TABLET | Freq: Every day | ORAL | 3 refills | Status: DC
  Filled 2021-11-07: qty 60, 60d supply, fill #0

## 2021-11-07 MED ORDER — OZEMPIC (0.25 OR 0.5 MG/DOSE) 2 MG/3ML ~~LOC~~ SOPN
0.2500 mg | PEN_INJECTOR | SUBCUTANEOUS | 0 refills | Status: DC
  Filled 2021-11-07: qty 3, 28d supply, fill #0

## 2021-11-07 MED ORDER — ATORVASTATIN CALCIUM 20 MG PO TABS
20.0000 mg | ORAL_TABLET | Freq: Every day | ORAL | 3 refills | Status: DC
  Filled 2021-11-07: qty 30, 30d supply, fill #0

## 2021-11-07 MED ORDER — VITAMIN D (ERGOCALCIFEROL) 1.25 MG (50000 UNIT) PO CAPS
50000.0000 [IU] | ORAL_CAPSULE | ORAL | 3 refills | Status: DC
  Filled 2021-11-07: qty 4, 28d supply, fill #0
  Filled 2022-03-05: qty 4, 28d supply, fill #1
  Filled 2022-06-30: qty 4, 28d supply, fill #2
  Filled 2022-07-11 – 2022-09-03 (×2): qty 4, 28d supply, fill #3
  Filled 2022-10-05: qty 4, 28d supply, fill #4

## 2021-11-07 NOTE — Telephone Encounter (Signed)
I stopped the [facility] and I sent in for Ozempic to try

## 2021-11-09 ENCOUNTER — Encounter: Payer: Self-pay | Admitting: Family Medicine

## 2021-11-10 ENCOUNTER — Encounter: Payer: Self-pay | Admitting: Family Medicine

## 2021-11-10 ENCOUNTER — Ambulatory Visit: Payer: BC Managed Care – PPO | Admitting: Internal Medicine

## 2021-11-10 ENCOUNTER — Encounter: Payer: Self-pay | Admitting: Internal Medicine

## 2021-11-10 ENCOUNTER — Other Ambulatory Visit: Payer: Self-pay | Admitting: Family Medicine

## 2021-11-10 ENCOUNTER — Other Ambulatory Visit (HOSPITAL_COMMUNITY): Payer: Self-pay

## 2021-11-10 VITALS — BP 147/99 | HR 104 | Ht 69.5 in | Wt 260.0 lb

## 2021-11-10 DIAGNOSIS — R000 Tachycardia, unspecified: Secondary | ICD-10-CM | POA: Diagnosis not present

## 2021-11-10 DIAGNOSIS — I10 Essential (primary) hypertension: Secondary | ICD-10-CM | POA: Diagnosis not present

## 2021-11-10 DIAGNOSIS — I951 Orthostatic hypotension: Secondary | ICD-10-CM

## 2021-11-10 MED ORDER — HYDROMORPHONE HCL 8 MG PO TABS
8.0000 mg | ORAL_TABLET | Freq: Every day | ORAL | 0 refills | Status: AC | PRN
  Filled 2021-11-10: qty 30, 30d supply, fill #0

## 2021-11-10 MED ORDER — OXYCODONE HCL 15 MG PO TABS
15.0000 mg | ORAL_TABLET | ORAL | 0 refills | Status: AC | PRN
  Filled 2021-11-10: qty 180, 30d supply, fill #0

## 2021-11-10 NOTE — Telephone Encounter (Signed)
Yes the same thing happened that we had for her ADHD med. I just sent in a new RX for Oxycodone and Hydromorphone dated today. We will need to send in new ones a month from now

## 2021-11-10 NOTE — Patient Instructions (Signed)
Medication Instructions:  Your physician recommends that you continue on your current medications as directed. Please refer to the Current Medication list given to you today.  *If you need a refill on your cardiac medications before your next appointment, please call your pharmacy*   Lab Work: None ordered.  If you have labs (blood work) drawn today and your tests are completely normal, you will receive your results only by: MyChart Message (if you have MyChart) OR A paper copy in the mail If you have any lab test that is abnormal or we need to change your treatment, we will call you to review the results.   Testing/Procedures: None ordered.    Follow-Up: At [facility], you and your health needs are our priority.  As part of our continuing mission to provide you with exceptional heart care, we have created designated Provider Care Teams.  These Care Teams include your primary Cardiologist (physician) and Advanced Practice Providers (APPs -  Physician Assistants and Nurse Practitioners) who all work together to provide you with the care you need, when you need it.  We recommend signing up for the patient portal called "MyChart".  Sign up information is provided on this After Visit Summary.  MyChart is used to connect with patients for Virtual Visits (Telemedicine).  Patients are able to view lab/test results, encounter notes, upcoming appointments, etc.  Non-urgent messages can be sent to your provider as well.   To learn more about what you can do with MyChart, go to [email or web address].    Your next appointment:   Follow up with Dr Klein as needed  Important Information About Sugar       

## 2021-11-10 NOTE — Progress Notes (Signed)
Patient Care Team: Laurey Morale, MD as PCP - General (Family Medicine) Kary Kos, MD as Consulting Physician (Neurosurgery) Inpatient, Rehab Nurse Manager, RN (Inactive) as Registered Nurse   HPI  [name] is a 46 y.o. female Seen in follow-up for a history of sinus tachycardia  She was last seen 6/20 via telehealth at which point her sinus tachycardia and orthostatic lightheadedness have been somewhat better as had her alopecia.  Evaluation has included a normal TSH, hemoglobin, surgery clinic chronic connective tissue evaluation was negative. Urine for catecholamines is abnormal but CT was negative.   . she has not tolerated beta blockers in the past w fatigue and hair loss. Including metoprolol succinate/atenolol  also been on verapamil and diltiazem the past.  she has heat intolerance, shower intolerance and some Orthostasis, she is s/p hysterectomy   Echocardiogram 4/18 shows normal LV function**and if anything small chamber size.  Underwent spinal fusion [id number] complicated by pulmonary embolism for which she was started on rivaroxaban.  Subsequently readmitted with fevers and chills.  Blood cultures were negative.   Readmitted a few days later with similar complaints but was noted to be afebrile.  No significant lightheadedness.  Still with rapid heartbeats.  Still a great deal of psychosocial stress. More pain on more gabapentin     Past Medical History:  Diagnosis Date   Allergy    Anxiety    Arthritis    B12 deficiency    Chronic narcotic use    Depression    not currently    Dysrhythmia    hx tachy-was from medication nortriptylline   Esophagitis    rx   Fibromyalgia    GERD (gastroesophageal reflux disease)    History of echocardiogram    Echo 4/18: Mild concentric LVH, vigorous LVF, EF 65-70, normal wall motion, grade 1 diastolic dysfunction   History of kidney stones    Hyperlipidemia    Intervertebral disc protrusion 01/11/2014    Migraine    Obesity (BMI 35.0-39.9 without comorbidity)    Peripheral neuropathy    Polycystic ovaries    Pulmonary embolus ([place])    Spinal stenosis, lumbar    Vitamin D deficiency     Past Surgical History:  Procedure Laterality Date   104 HOUR [place] STUDY N/A 05/27/2016   Procedure: 24 HOUR [place] STUDY;  Surgeon: Manus Gunning, MD;  Location: [facility];  Service: Gastroenterology;  Laterality: N/A;   ABDOMINAL EXPOSURE N/A 05/04/2018   Procedure: ABDOMINAL EXPOSURE;  Surgeon: Rosetta Posner, MD;  Location: [place];  Service: Vascular;  Laterality: N/A;   ANTERIOR LUMBAR FUSION N/A 05/04/2018   Procedure: Lumbar Five Sacral One Anterior lumbar interbody fusion;  Surgeon: Kary Kos, MD;  Location: [place];  Service: Neurosurgery;  Laterality: N/A;  Lumbar Five Sacral One Anterior lumbar interbody fusion   APPENDECTOMY     BACK SURGERY  2012   left laminectomy at L3-4 per Dr. Luiz Ochoa    bladder tack  2012   CHOLECYSTECTOMY N/A 10/03/2015   Procedure: LAPAROSCOPIC CHOLECYSTECTOMY;  Surgeon: Mickeal Skinner, MD;  Location: [place];  Service: General;  Laterality: N/A;   ESOPHAGEAL MANOMETRY N/A 05/27/2016   Procedure: ESOPHAGEAL MANOMETRY (EM);  Surgeon: Manus Gunning, MD;  Location: [facility];  Service: Gastroenterology;  Laterality: N/A;   EXCISION OF SKIN TAG  10/03/2015   Procedure: EXCISION OF SKIN TAG;  Surgeon: Arta Bruce Kinsinger, MD;  Location: [place];  Service: General;;  LUMBAR DISC SURGERY  01-31-14   per Dr. Saintclair Halsted    OOPHORECTOMY     left overy   OVARIAN CYST REMOVAL     RECTOCELE REPAIR     SINUSOTOMY  12/14/06   spinal fusion  2016/10   TONSILLECTOMY     TONSILLECTOMY     VAGINAL HYSTERECTOMY     WRIST GANGLION EXCISION Left     Current Outpatient Medications  Medication Sig Dispense Refill   ALPRAZolam (XANAX) 0.5 MG tablet TAKE 1 TABLET BY MOUTH THREE TIMES A DAY AS NEEDED 90 tablet 5   amphetamine-dextroamphetamine (ADDERALL XR) 30 MG 24 hr capsule  Take 1 capsule (30 mg total) by mouth daily. 30 capsule 0   atorvastatin (LIPITOR) 20 MG tablet Take 1 tablet (20 mg total) by mouth daily. 90 tablet 3   B-D 3CC LUER-LOK SYR 25GX1" 25G X 1" 3 ML MISC USE AS DIRECTED 9 each 2   cetirizine (ZYRTEC) 10 MG tablet Take 10 mg by mouth daily.     clobetasol (TEMOVATE) 0.05 % external solution clobetasol 0.05 % scalp solution     CYANOCOBALAMIN IJ Inject as directed once a week.     cyclobenzaprine (FLEXERIL) 10 MG tablet Take 1 tablet (10 mg total) by mouth 3 (three) times daily as needed for muscle spasms. 90 tablet 5   esomeprazole (NEXIUM) 40 MG capsule TAKE 1 CAPSULE BY MOUTH EVERY DAY 90 capsule 0   famotidine (PEPCID) 40 MG tablet Take 1 tablet (40 mg total) by mouth at bedtime. 90 tablet 1   Fluocinolone Acetonide Body 0.01 % OIL fluocinolone 0.01 % scalp oil and shower cap     fluticasone (FLONASE) 50 MCG/ACT nasal spray Place 2 sprays into both nostrils daily. 16 g 6   furosemide (LASIX) 20 MG tablet Take 2 tablets by mouth twice daily as needed for fluid. 90 tablet 1   gabapentin (NEURONTIN) 100 MG capsule Take 1 capsule (100 mg total) by mouth 3 (three) times daily. 270 capsule 1   gabapentin (NEURONTIN) 300 MG capsule Take 1 capsule (300 mg total) by mouth 3 (three) times daily. 270 capsule 1   hydrochlorothiazide (HYDRODIURIL) 25 MG tablet TAKE 1 TABLET BY MOUTH EVERY DAY 90 tablet 1   lidocaine (LIDODERM) 5 % Place 1 patch onto the skin daily. Remove & Discard patch within 12 hours or as directed by MD 30 patch 5   Multiple Vitamins-Minerals (MULTI-VITAMIN GUMMIES PO) Take 2 tablets by mouth daily.     promethazine (PHENERGAN) 25 MG tablet TAKE 1 TABLET BY MOUTH EVERY 6 HOURS AS NEEDED FOR NAUSEA 90 tablet 1   Semaglutide,0.25 or 0.5MG /DOS, (OZEMPIC, 0.25 OR 0.5 MG/DOSE,) 2 MG/3ML SOPN Inject 0.25 mg into the skin once a week. 3 mL 0   sucralfate (CARAFATE) 1 g tablet Take 1 tablet (1 g total) by mouth 4 (four) times daily. 120 tablet 2    SUMAtriptan (IMITREX) 100 MG tablet Take 1 tablet earliest onset of migraine.  May repeat once in 2 hours if headache persists or recurs. 10 tablet 2   Vitamin D, Ergocalciferol, (DRISDOL) 1.25 MG (50000 UNIT) CAPS capsule Take 1 capsule (50,000 Units total) by mouth every 7 (seven) days. 12 capsule 3   atorvastatin (LIPITOR) 20 MG tablet Take 1 tablet (20 mg total) by mouth daily. (Patient not taking: Reported on 11/10/2021) 90 tablet 3   fluconazole (DIFLUCAN) 150 MG tablet Take 1 tablet (150 mg total) by mouth daily. -For your yeast infection, start the  Diflucan (fluconazole)- Take one pill today (day 1). If you're still having symptoms in 3 days, take the second pill. (Patient not taking: Reported on 11/10/2021) 2 tablet 0   HYDROmorphone (DILAUDID) 8 MG tablet Take 1 tablet by mouth daily as needed for severe pain. 30 tablet 0   oxyCODONE (ROXICODONE) 15 MG immediate release tablet Take 1 tablet by mouth every 4 hours as needed for pain. 180 tablet 0   No current facility-administered medications for this visit.    Allergies  Allergen Reactions   Gadolinium Derivatives Swelling and Other (See Comments)    Arm swelling, tingling, & redness. Radiologist Dr. Pascal Lux recommends an injection of steroids, or 13-hour prep if non-emergent, if patient needs a gadolinium based contrast in the future.   Glycopyrrolate Other (See Comments)    Inability to pass urine    Keflex [Cephalexin] Photosensitivity   Other Rash and Other (See Comments)    Glue or tape to seal up wound   Zoloft [Sertraline Hcl] Other (See Comments)    Hair loss      Review of Systems negative except from HPI and PMH  Physical Exam BP (!) 147/99   Pulse (!) 104   Ht 5' 9.5" (1.765 m)   Wt 260 lb (117.9 kg)   LMP 12/13/1999 Comment: single oophorectomy//a.c.  SpO2 93%   BMI 37.84 kg/m  Well developed and nourished in no acute distress HENT normal Neck supple with JVP-  flat   Clear Regular rate and rhythm, no murmurs  or gallops Abd-soft with active BS No Clubbing cyanosis edema Skin-warm and dry A & Oriented  Grossly normal sensory and motor function  ECG sinus @ 108 14/08/35   Assessment and Plan   Tachycardia-sinus   Hypertension -orthostatic   Orthostatic intolerance   Stress/depression    Functional status is much improved.  She has no lightheadedness of which she is aware.  Heart rates are rapid but not bothersome.  She is repeatedly tearful outlining the stresses in her life related to the loss of her mother about 2 years ago and challenges with her family ongoing.

## 2021-11-11 ENCOUNTER — Other Ambulatory Visit (HOSPITAL_COMMUNITY): Payer: Self-pay

## 2021-11-14 ENCOUNTER — Ambulatory Visit (INDEPENDENT_AMBULATORY_CARE_PROVIDER_SITE_OTHER): Payer: BC Managed Care – PPO | Admitting: Psychology

## 2021-11-14 DIAGNOSIS — F339 Major depressive disorder, recurrent, unspecified: Secondary | ICD-10-CM

## 2021-11-14 NOTE — Progress Notes (Signed)
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 (Status: maintained) -- No Description Entered  Medication Status compliance  Safety none  If Suicidal or Homicidal State Action Taken: unspecified  Current Risk: low Medications unspecified Objectives Related Problem: Partners verbally express empathy toward each other and demonstrate shared responsibility for reconstructing the relationship. Description: Clarify what level of trust exists for various aspects of the relationship. Target Date: 2022-05-27 Frequency: Daily Modality: individual Progress: 50%  Related Problem: Partners verbally express empathy toward each other and demonstrate shared responsibility for reconstructing the relationship. Description: List relationship issues that contribute to dissatisfaction and unhappiness and agree to address these in future conjoint sessions. Target Date: 2022-05-27 Frequency: Daily Modality: individual Progress: 40%  Related Problem: Partners verbally express empathy toward each other and demonstrate shared responsibility for reconstructing the relationship. Description: Identify and list behavioral changes for self and partner that would enhance the relationship. Target Date: 2022-05-27 Frequency: Daily Modality: individual Progress: 40%  Related Problem: Partners verbally express empathy toward each other and demonstrate shared responsibility for reconstructing the relationship. Description: Describe pre-affair relationship functioning. Target Date: 2022-05-27 Frequency: Daily Modality: individual Progress:  50%  Related Problem: Partners verbally express empathy toward each other and demonstrate shared responsibility for reconstructing the relationship. Description: Ask the partners to generate factors that they believe contributed to the affair. Target Date: 2022-05-27 Frequency: Daily Modality: individual Progress: 60%   Problem: Partners verbally express empathy toward each other and demonstrate shared responsibility for reconstructing the relationship. Description: Hurt partner uses anxiety-management techniques to deal with intrusive thoughts about the affair. Target Date: 2022-05-27 Frequency: Daily Modality: individual Progress: 40%  Client Response full compliance  Service Location Location, [address] [place], [place] [address]  Service Code cpt [phone]  Self care activities  Provide education, information  Emotion regulation skills  Validate/empathize  Identify/label emotions  Facilitate problem solving  Comments  Patient agrees to a video session due to the pandemic. They are at home and I am in my home office.  Dx.: Depression  Meds: Xanax. Tried antidep. meds, but she has significant side-effects. Goals: They are seeking counseling to resolve significant marital conflict secondary to infidelity. [name] has experienced symptoms of both anxiety and depression during this period and is hoping to reduce these symptoms as the relationship issues improve. [name] is experiencing some depressive symptoms as well, along with considerable anger and frustration. Goal date 12-23   [name]: She states that everything is going okay at the moment, but they were bad after the last session. She says that [name] started on testosterone and she thinks it is helping his attitude. She feels that [name] is "protecting" her feels but I shared that I think [name] is protecting himself. She talked about [name]'s actions that suggest to her that he loves and cares about her. Suggested she reinforce his  kind actions and use it as springboard to address what she needs verbally.  [name], Ph.D.  Time: 11:35a-12:30p 55 minutes

## 2021-11-19 ENCOUNTER — Ambulatory Visit (INDEPENDENT_AMBULATORY_CARE_PROVIDER_SITE_OTHER): Payer: BC Managed Care – PPO | Admitting: Psychology

## 2021-11-19 DIAGNOSIS — F339 Major depressive disorder, recurrent, unspecified: Secondary | ICD-10-CM | POA: Diagnosis not present

## 2021-11-19 NOTE — Progress Notes (Signed)
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 (Status: maintained) -- No Description Entered  Medication Status compliance  Safety none  If Suicidal or Homicidal State Action Taken: unspecified  Current Risk: low Medications unspecified Objectives Related Problem: Partners verbally express empathy toward each other and demonstrate shared responsibility for reconstructing the relationship. Description: Clarify what level of trust exists for various aspects of the relationship. Target Date: 2022-05-27 Frequency: Daily Modality: individual Progress: 50%  Related Problem: Partners verbally express empathy toward each other and demonstrate shared responsibility for reconstructing the relationship. Description: List relationship issues that contribute to dissatisfaction and unhappiness and agree to address these in future conjoint sessions. Target Date: 2022-05-27 Frequency: Daily Modality: individual Progress: 40%  Related Problem: Partners verbally express empathy toward each other and demonstrate shared responsibility for reconstructing the relationship. Description: Identify and list behavioral changes for self and partner that would enhance the relationship. Target Date: 2022-05-27 Frequency: Daily Modality: individual Progress: 40%  Related Problem: Partners verbally express empathy toward each other and demonstrate shared responsibility for reconstructing the relationship. Description: Describe pre-affair relationship functioning. Target Date: 2022-05-27 Frequency: Daily Modality:  individual Progress: 50%  Related Problem: Partners verbally express empathy toward each other and demonstrate shared responsibility for reconstructing the relationship. Description: Ask the partners to generate factors that they believe contributed to the affair. Target Date: 2022-05-27 Frequency: Daily Modality: individual Progress: 60%   Problem: Partners verbally express empathy toward each other and demonstrate shared responsibility for reconstructing the relationship. Description: Hurt partner uses anxiety-management techniques to deal with intrusive thoughts about the affair. Target Date: 2022-05-27 Frequency: Daily Modality: individual Progress: 40%  Client Response full compliance  Service Location Location, [address] [place], [place] [address]  Service Code cpt [phone]  Self care activities  Provide education, information  Emotion regulation skills  Validate/empathize  Identify/label emotions  Facilitate problem solving  Comments  Patient agrees to a video session due to the pandemic. They are at home and I am in my home office.  Dx.: Depression  Meds: Xanax. Tried antidep. meds, but she has significant side-effects. Goals: They are seeking counseling to resolve significant marital conflict secondary to infidelity. [name] has experienced symptoms of both anxiety and depression during this period and is hoping to reduce these symptoms as the relationship issues improve. [name] is experiencing some depressive symptoms as well, along with considerable anger and frustration. Goal date 12-23   [name]: She states that "nothing is new" except that [name] is going to start to see [name] for individual therapy. She maintains that she continues to come to work with [name] because she doesn't want to home alone. Feels that being with him in the evening is comforting. They talked about their future of continuing to work away from home versus being able to return to their home.  He feels his options are limited and they have few choices since the pandemic.  Told them they need to have dialogue with each other about goals/plans and we will revisit.  [name], Ph.D.  Time:5:10p-6:00p 50 minutes

## 2021-11-20 DIAGNOSIS — U071 COVID-19: Secondary | ICD-10-CM | POA: Diagnosis not present

## 2021-11-25 ENCOUNTER — Other Ambulatory Visit (HOSPITAL_COMMUNITY): Payer: Self-pay

## 2021-11-27 ENCOUNTER — Telehealth: Payer: Self-pay

## 2021-11-27 ENCOUNTER — Other Ambulatory Visit (HOSPITAL_COMMUNITY): Payer: Self-pay

## 2021-11-27 ENCOUNTER — Encounter: Payer: Self-pay | Admitting: Family Medicine

## 2021-11-27 ENCOUNTER — Ambulatory Visit (INDEPENDENT_AMBULATORY_CARE_PROVIDER_SITE_OTHER): Payer: BC Managed Care – PPO | Admitting: Psychology

## 2021-11-27 DIAGNOSIS — F339 Major depressive disorder, recurrent, unspecified: Secondary | ICD-10-CM | POA: Diagnosis not present

## 2021-11-27 NOTE — Telephone Encounter (Signed)
Prior authorization for Ozempic was approved from 10/26/21 to 11/25/22.   PA Case number- [id number].   Called [facility] l[facility] with information.

## 2021-11-27 NOTE — Progress Notes (Signed)
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 (Status: maintained) -- No Description Entered  Medication Status compliance  Safety none  If Suicidal or Homicidal State Action Taken: unspecified  Current Risk: low Medications unspecified Objectives Related Problem: Partners verbally express empathy toward each other and demonstrate shared responsibility for reconstructing the relationship. Description: Clarify what level of trust exists for various aspects of the relationship. Target Date: 2022-05-27 Frequency: Daily Modality: individual Progress: 50%  Related Problem: Partners verbally express empathy toward each other and demonstrate shared responsibility for reconstructing the relationship. Description: List relationship issues that contribute to dissatisfaction and unhappiness and agree to address these in future conjoint sessions. Target Date: 2022-05-27 Frequency: Daily Modality: individual Progress: 40%  Related Problem: Partners verbally express empathy toward each other and demonstrate shared responsibility for reconstructing the relationship. Description: Identify and list behavioral changes for self and partner that would enhance the relationship. Target Date: 2022-05-27 Frequency: Daily Modality: individual Progress: 40%  Related Problem: Partners verbally express empathy toward each other and demonstrate shared responsibility for reconstructing the relationship. Description: Describe pre-affair relationship functioning. Target Date:  2022-05-27 Frequency: Daily Modality: individual Progress: 50%  Related Problem: Partners verbally express empathy toward each other and demonstrate shared responsibility for reconstructing the relationship. Description: Ask the partners to generate factors that they believe contributed to the affair. Target Date: 2022-05-27 Frequency: Daily Modality: individual Progress: 60%   Problem: Partners verbally express empathy toward each other and demonstrate shared responsibility for reconstructing the relationship. Description: Hurt partner uses anxiety-management techniques to deal with intrusive thoughts about the affair. Target Date: 2022-05-27 Frequency: Daily Modality: individual Progress: 40%  Client Response full compliance  Service Location Location, [address] [place], [place] [address]  Service Code cpt [phone]  Self care activities  Provide education, information  Emotion regulation skills  Validate/empathize  Identify/label emotions  Facilitate problem solving  Comments  Patient agrees to a video session. They are at home and I am in my home office.  Dx.: Depression  Meds: Xanax. Tried antidep. meds, but she has significant side-effects. Goals: They are seeking counseling to resolve significant marital conflict secondary to infidelity. [name] has experienced symptoms of both anxiety and depression during this period and is hoping to reduce these symptoms as the relationship issues improve. [name] is experiencing some depressive symptoms as well, along with considerable anger and frustration. Goal date 12-23   Dymin and [name]: They say that their son failed to appear in court for traffic violation and for child support. They threaten to pull his driver's license. He had another court date and judge told him to get attorney. He did it, but son is not following through on what was recommended by the attorney. He is very frustrated with his son. And their daughter is  moving out to live with boyfriend. [name] says that his daughter is still angry with him. He does not think that the relationship with his daughter is repairable. Semaya disagrees and  says their daughter just wants her father to ask about her and show an interest in her. Both of them feel their daughter is "punishing" them by rejection. Suggested tat they consider family counseling. Will consider.                                                              [name], Ph.D.  Time:4:10p-5:00p 50 minutes

## 2021-11-28 ENCOUNTER — Ambulatory Visit: Payer: BC Managed Care – PPO | Admitting: Psychology

## 2021-12-01 ENCOUNTER — Other Ambulatory Visit (HOSPITAL_COMMUNITY): Payer: Self-pay

## 2021-12-01 ENCOUNTER — Other Ambulatory Visit: Payer: Self-pay | Admitting: Family Medicine

## 2021-12-01 MED ORDER — AMPHETAMINE-DEXTROAMPHET ER 30 MG PO CP24
30.0000 mg | ORAL_CAPSULE | Freq: Every day | ORAL | 0 refills | Status: DC
  Filled 2021-12-01: qty 30, 30d supply, fill #0

## 2021-12-01 NOTE — Telephone Encounter (Signed)
Pt LOV was on 11/04/2021 Last refill was done on 11/03/2021 Please advise

## 2021-12-03 ENCOUNTER — Ambulatory Visit (INDEPENDENT_AMBULATORY_CARE_PROVIDER_SITE_OTHER): Payer: BC Managed Care – PPO | Admitting: Psychology

## 2021-12-03 DIAGNOSIS — F339 Major depressive disorder, recurrent, unspecified: Secondary | ICD-10-CM | POA: Diagnosis not present

## 2021-12-03 NOTE — Progress Notes (Signed)
Date: 12/03/2021  Treatment Plan Diagnosis 296.33 (Major depressive affective disorder, recurrent episode, severe, without mention of psychotic behavior) [n/a]  F43.22 (Adjustment Disorder, With anxiety) [n/a]  Symptoms One partner engages in sexual behavior (e.g., penile-vaginal intercourse, oral sex, anal sex) that violates the explicit or implicit expectations of the relationship. (Status: maintained) -- No Description Entered  Medication Status compliance  Safety none  If Suicidal or Homicidal State Action Taken: unspecified  Current Risk: low Medications unspecified Objectives Related Problem: Partners verbally express empathy toward each other and demonstrate shared responsibility for reconstructing the relationship. Description: Clarify what level of trust exists for various aspects of the relationship. Target Date: 2022-05-27 Frequency: Daily Modality: individual Progress: 50%  Related Problem: Partners verbally express empathy toward each other and demonstrate shared responsibility for reconstructing the relationship. Description: List relationship issues that contribute to dissatisfaction and unhappiness and agree to address these in future conjoint sessions. Target Date: 2022-05-27 Frequency: Daily Modality: individual Progress: 40%  Related Problem: Partners verbally express empathy toward each other and demonstrate shared responsibility for reconstructing the relationship. Description: Identify and list behavioral changes for self and partner that would enhance the relationship. Target Date: 2022-05-27 Frequency: Daily Modality: individual Progress: 40%  Related Problem: Partners verbally express empathy toward each other and demonstrate shared responsibility for reconstructing the relationship. Description: Describe pre-affair relationship functioning. Target Date: 2022-05-27 Frequency: Daily Modality: individual Progress: 50%  Related  Problem: Partners verbally express empathy toward each other and demonstrate shared responsibility for reconstructing the relationship. Description: Ask the partners to generate factors that they believe contributed to the affair. Target Date: 2022-05-27 Frequency: Daily Modality: individual Progress: 60%   Problem: Partners verbally express empathy toward each other and demonstrate shared responsibility for reconstructing the relationship. Description: Hurt partner uses anxiety-management techniques to deal with intrusive thoughts about the affair. Target Date: 2022-05-27 Frequency: Daily Modality: individual Progress: 40%  Client Response full compliance  Service Location Location, [address] [place], [place] [address]  Service Code cpt [phone]  Self care activities  Provide education, information  Emotion regulation skills  Validate/empathize  Identify/label emotions  Facilitate problem solving  Comments  Patient agrees to a video session. They are at home and I am in my home office.  Dx.: Depression  Meds: Xanax. Tried antidep. meds, but she has significant side-effects. Goals: They are seeking counseling to resolve significant marital conflict secondary to infidelity. [name] has experienced symptoms of both anxiety and depression during this period and is hoping to reduce these symptoms as the relationship issues improve. [name] is experiencing some depressive symptoms as well, along with considerable anger and frustration. Goal date 12-23   Shontay and [name]: Discussed more of the relationship issues with their son and daughter. The conflict with them translates into conflict in the marriage. They are stuck in a conflictual pattern they cannot seem to break. I recommended that [name] step forward to approach their daughter to have a productive dialogue without judgement or fear of repercussions. They will consider. [name] is somewhat defensive of her daughter's position and  there is a dysfunction triangle of communication that needs to be addressed.  [name], Ph.D.  Time:5:10p-6:00p 50 minutes

## 2021-12-05 ENCOUNTER — Telehealth: Payer: Self-pay

## 2021-12-05 NOTE — Telephone Encounter (Signed)
Pt PA for Ozempic was approved

## 2021-12-09 NOTE — Telephone Encounter (Signed)
Keamber Pagnotta KeyKearney Hard - PA Case ID: [id number] Need help? Call us at [phone] Status Sent to [facility] Lidocaine 5% patches Form [employer] Electronic PA Form [phone] NCPDP)

## 2021-12-12 ENCOUNTER — Other Ambulatory Visit (HOSPITAL_COMMUNITY): Payer: Self-pay

## 2021-12-12 ENCOUNTER — Encounter: Payer: Self-pay | Admitting: Family Medicine

## 2021-12-12 ENCOUNTER — Ambulatory Visit (INDEPENDENT_AMBULATORY_CARE_PROVIDER_SITE_OTHER): Payer: BC Managed Care – PPO | Admitting: Psychology

## 2021-12-12 DIAGNOSIS — F339 Major depressive disorder, recurrent, unspecified: Secondary | ICD-10-CM

## 2021-12-12 MED ORDER — OXYCODONE HCL 15 MG PO TABS
15.0000 mg | ORAL_TABLET | ORAL | 0 refills | Status: DC | PRN
  Filled 2021-12-12: qty 180, 30d supply, fill #0

## 2021-12-12 MED ORDER — HYDROMORPHONE HCL 8 MG PO TABS
8.0000 mg | ORAL_TABLET | Freq: Every day | ORAL | 0 refills | Status: DC | PRN
  Filled 2021-12-12: qty 30, 30d supply, fill #0

## 2021-12-12 NOTE — Telephone Encounter (Signed)
Done

## 2021-12-12 NOTE — Progress Notes (Signed)
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 (Status: maintained) -- No Description Entered  Medication Status compliance  Safety none  If Suicidal or Homicidal State Action Taken: unspecified  Current Risk: low Medications unspecified Objectives Related Problem: Partners verbally express empathy toward each other and demonstrate shared responsibility for reconstructing the relationship. Description: Clarify what level of trust exists for various aspects of the relationship. Target Date: 2022-05-27 Frequency: Daily Modality: individual Progress: 50%  Related Problem: Partners verbally express empathy toward each other and demonstrate shared responsibility for reconstructing the relationship. Description: List relationship issues that contribute to dissatisfaction and unhappiness and agree to address these in future conjoint sessions. Target Date: 2022-05-27 Frequency: Daily Modality: individual Progress: 40%  Related Problem: Partners verbally express empathy toward each other and demonstrate shared responsibility for reconstructing the relationship. Description: Identify and list behavioral changes for self and partner that would enhance the relationship. Target Date: 2022-05-27 Frequency: Daily Modality: individual Progress: 40%  Related Problem: Partners verbally express empathy toward each other and demonstrate shared responsibility for reconstructing the relationship. Description: Describe pre-affair relationship functioning. Target Date: 2022-05-27 Frequency: Daily Modality:  individual Progress: 50%  Related Problem: Partners verbally express empathy toward each other and demonstrate shared responsibility for reconstructing the relationship. Description: Ask the partners to generate factors that they believe contributed to the affair. Target Date: 2022-05-27 Frequency: Daily Modality: individual Progress: 60%   Problem: Partners verbally express empathy toward each other and demonstrate shared responsibility for reconstructing the relationship. Description: Hurt partner uses anxiety-management techniques to deal with intrusive thoughts about the affair. Target Date: 2022-05-27 Frequency: Daily Modality: individual Progress: 40%  Client Response full compliance  Service Location Location, [address] [place], [place] [address]  Service Code cpt [phone]  Self care activities  Provide education, information  Emotion regulation skills  Validate/empathize  Identify/label emotions  Facilitate problem solving  Comments  Patient agrees to a video session. They are at home and I am in my home office.  Dx.: Depression  Meds: Xanax. Tried antidep. meds, but she has significant side-effects. Goals: They are seeking counseling to resolve significant marital conflict secondary to infidelity. [name] has experienced symptoms of both anxiety and depression during this period and is hoping to reduce these symptoms as the relationship issues improve. [name] is experiencing some depressive symptoms as well, along with considerable anger and frustration. Goal date 12-23   Ambriel and [name]: She states that she and [name] are not arguing, but that he is not expressing any feelings. She says she is waiting for him to get into therapy with the hopes that he will change. She says she remains confused about why he could express so much to "the other woman". She feels it is "pride and control" and "not wanting to give in to me". We had extensive discussion about whether [name]  should take call in the house versus going outside as he did in past. [name] is defensive and upset that she would not trust him. She states she is unable to share her feelings without him getting upset with  her. He claims he is doing all that Raymona has asked of him "and she still gets upset". Doesn't believe it will ever improve. We talked about realistic expectations and the need to be sensitive.                                                                   [name], Ph.D.  Time:11:35a-12:30p 

## 2021-12-17 ENCOUNTER — Ambulatory Visit (INDEPENDENT_AMBULATORY_CARE_PROVIDER_SITE_OTHER): Payer: BC Managed Care – PPO | Admitting: Psychology

## 2021-12-17 DIAGNOSIS — F339 Major depressive disorder, recurrent, unspecified: Secondary | ICD-10-CM

## 2021-12-17 NOTE — Progress Notes (Signed)
Date: 12/17/2021  Treatment Plan Diagnosis 296.33 (Major depressive affective disorder, recurrent episode, severe, without mention of psychotic behavior) [n/a]  F43.22 (Adjustment Disorder, With anxiety) [n/a]  Symptoms One partner engages in sexual behavior (e.g., penile-vaginal intercourse, oral sex, anal sex) that violates the explicit or implicit expectations of the relationship. (Status: maintained) -- No Description Entered  Medication Status compliance  Safety none  If Suicidal or Homicidal State Action Taken: unspecified  Current Risk: low Medications unspecified Objectives Related Problem: Partners verbally express empathy toward each other and demonstrate shared responsibility for reconstructing the relationship. Description: Clarify what level of trust exists for various aspects of the relationship. Target Date: 2022-05-27 Frequency: Daily Modality: individual Progress: 50%  Related Problem: Partners verbally express empathy toward each other and demonstrate shared responsibility for reconstructing the relationship. Description: List relationship issues that contribute to dissatisfaction and unhappiness and agree to address these in future conjoint sessions. Target Date: 2022-05-27 Frequency: Daily Modality: individual Progress: 40%  Related Problem: Partners verbally express empathy toward each other and demonstrate shared responsibility for reconstructing the relationship. Description: Identify and list behavioral changes for self and partner that would enhance the relationship. Target Date: 2022-05-27 Frequency: Daily Modality: individual Progress: 40%  Related Problem: Partners verbally express empathy toward each other and demonstrate shared responsibility for reconstructing the relationship. Description: Describe pre-affair relationship functioning. Target Date: 2022-05-27 Frequency: Daily Modality: individual Progress:  50%  Related Problem: Partners verbally express empathy toward each other and demonstrate shared responsibility for reconstructing the relationship. Description: Ask the partners to generate factors that they believe contributed to the affair. Target Date: 2022-05-27 Frequency: Daily Modality: individual Progress: 60%   Problem: Partners verbally express empathy toward each other and demonstrate shared responsibility for reconstructing the relationship. Description: Hurt partner uses anxiety-management techniques to deal with intrusive thoughts about the affair. Target Date: 2022-05-27 Frequency: Daily Modality: individual Progress: 40%  Client Response full compliance  Service Location Location, [address] [place], [place] [address]  Service Code cpt [phone]  Self care activities  Provide education, information  Emotion regulation skills  Validate/empathize  Identify/label emotions  Facilitate problem solving  Comments  Patient agrees to a video session. They are at home and I am in my home office.  Dx.: Depression  Meds: Xanax. Tried antidep. meds, but she has significant side-effects. Goals: They are seeking counseling to resolve significant marital conflict secondary to infidelity. [name] has experienced symptoms of both anxiety and depression during this period and is hoping to reduce these symptoms as the relationship issues improve. [name] is experiencing some depressive symptoms as well, along with considerable anger and frustration. Goal date 12-23   Akima and [name]: She said that her son and his girlfriend are pregnant again. Their baby is 10 months old and she is now 2 months pregnant with another. She feels tired of being everyone's caretaker. Both Kwana and [name] are distressed about their son's behaviors and choices. They continue to make destructive comments to each other and have little confidence that they can get to a place of happiness. We talked about  realistic expectations.  [name], Ph.D.  Time:5:10p-6:00p 

## 2021-12-20 DIAGNOSIS — U071 COVID-19: Secondary | ICD-10-CM | POA: Diagnosis not present

## 2021-12-26 ENCOUNTER — Ambulatory Visit: Payer: BC Managed Care – PPO | Admitting: Psychology

## 2021-12-26 ENCOUNTER — Other Ambulatory Visit (HOSPITAL_COMMUNITY): Payer: Self-pay

## 2021-12-26 ENCOUNTER — Other Ambulatory Visit: Payer: Self-pay | Admitting: Family Medicine

## 2021-12-26 MED ORDER — OZEMPIC (0.25 OR 0.5 MG/DOSE) 2 MG/3ML ~~LOC~~ SOPN
0.5000 mg | PEN_INJECTOR | SUBCUTANEOUS | 2 refills | Status: DC
  Filled 2021-12-26: qty 3, 28d supply, fill #0
  Filled 2022-01-30: qty 3, 28d supply, fill #1

## 2021-12-26 MED ORDER — AMPHETAMINE-DEXTROAMPHET ER 30 MG PO CP24
30.0000 mg | ORAL_CAPSULE | Freq: Every day | ORAL | 0 refills | Status: DC
Start: 2021-12-26 — End: 2022-01-30
  Filled 2022-01-02: qty 30, 30d supply, fill #0

## 2021-12-26 NOTE — Telephone Encounter (Addendum)
Last VV- 11/04/21 Last refill Adderall XR-12/01/21, Ozempic- 11/07/21  No future OV scheduled.

## 2021-12-26 NOTE — Telephone Encounter (Signed)
Done

## 2021-12-27 ENCOUNTER — Other Ambulatory Visit (HOSPITAL_COMMUNITY): Payer: Self-pay

## 2021-12-30 ENCOUNTER — Other Ambulatory Visit (HOSPITAL_COMMUNITY): Payer: Self-pay

## 2021-12-31 ENCOUNTER — Ambulatory Visit (INDEPENDENT_AMBULATORY_CARE_PROVIDER_SITE_OTHER): Payer: BC Managed Care – PPO | Admitting: Psychology

## 2021-12-31 DIAGNOSIS — F339 Major depressive disorder, recurrent, unspecified: Secondary | ICD-10-CM | POA: Diagnosis not present

## 2021-12-31 NOTE — Progress Notes (Signed)
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 (Status: maintained) -- No Description Entered  Medication Status compliance  Safety none  If Suicidal or Homicidal State Action Taken: unspecified  Current Risk: low Medications unspecified Objectives Related Problem: Partners verbally express empathy toward each other and demonstrate shared responsibility for reconstructing the relationship. Description: Clarify what level of trust exists for various aspects of the relationship. Target Date: 2022-05-27 Frequency: Daily Modality: individual Progress: 50%  Related Problem: Partners verbally express empathy toward each other and demonstrate shared responsibility for reconstructing the relationship. Description: List relationship issues that contribute to dissatisfaction and unhappiness and agree to address these in future conjoint sessions. Target Date: 2022-05-27 Frequency: Daily Modality: individual Progress: 40%  Related Problem: Partners verbally express empathy toward each other and demonstrate shared responsibility for reconstructing the relationship. Description: Identify and list behavioral changes for self and partner that would enhance the relationship. Target Date: 2022-05-27 Frequency: Daily Modality: individual Progress: 40%  Related Problem: Partners verbally express empathy toward each other and demonstrate shared responsibility for reconstructing the relationship. Description: Describe pre-affair relationship functioning. Target Date: 2022-05-27 Frequency: Daily Modality: individual Progress: 50%  Related  Problem: Partners verbally express empathy toward each other and demonstrate shared responsibility for reconstructing the relationship. Description: Ask the partners to generate factors that they believe contributed to the affair. Target Date: 2022-05-27 Frequency: Daily Modality: individual Progress: 60%   Problem: Partners verbally express empathy toward each other and demonstrate shared responsibility for reconstructing the relationship. Description: Hurt partner uses anxiety-management techniques to deal with intrusive thoughts about the affair. Target Date: 2022-05-27 Frequency: Daily Modality: individual Progress: 40%  Client Response full compliance  Service Location Location, [address] [place], [place] [address]  Service Code cpt [phone]  Self care activities  Provide education, information  Emotion regulation skills  Validate/empathize  Identify/label emotions  Facilitate problem solving  Comments  Patient agrees to a video session. They are at home and I am in my home office.  Dx.: Depression  Meds: Xanax. Tried antidep. meds, but she has significant side-effects. Goals: They are seeking counseling to resolve significant marital conflict secondary to infidelity. [name] has experienced symptoms of both anxiety and depression during this period and is hoping to reduce these symptoms as the relationship issues improve. [name] is experiencing some depressive symptoms as well, along with considerable anger and frustration. Goal date 12-23   [name] and [name]: She talked about her migraine last week that was the worst she has had in a very long time. She feels that not having our appointment last week made her week much more difficult. She says that she and [name] had a good week and got along well. She believes that she and [name] need to work on and focus on communication. [name] was agitated with a situation at work, but he ended up getting very angry at [employer]. She thought he  was validating him and he felt she was complaining. We need to develop better communication strategies. Need to be able to stop arguments when it devolves into name calling. She has a hard time letting go and he has trouble apologizing. Coached on how to make apologies that  are productive.                                                                         [name], Ph.D.  Time:5:10p-6:00p 

## 2022-01-02 ENCOUNTER — Other Ambulatory Visit (HOSPITAL_COMMUNITY): Payer: Self-pay

## 2022-01-07 ENCOUNTER — Other Ambulatory Visit: Payer: Self-pay | Admitting: Family Medicine

## 2022-01-08 ENCOUNTER — Other Ambulatory Visit (HOSPITAL_COMMUNITY): Payer: Self-pay

## 2022-01-08 MED ORDER — ESOMEPRAZOLE MAGNESIUM 40 MG PO CPDR
40.0000 mg | DELAYED_RELEASE_CAPSULE | Freq: Every day | ORAL | 0 refills | Status: DC
  Filled 2022-01-08: qty 30, 30d supply, fill #0
  Filled 2022-02-12: qty 30, 30d supply, fill #1
  Filled 2022-03-14: qty 30, 30d supply, fill #2

## 2022-01-08 NOTE — Telephone Encounter (Signed)
Last VV- 11/14/2021 Last refills both medications- 12/12/21  No future OV scheduled.

## 2022-01-09 ENCOUNTER — Other Ambulatory Visit (HOSPITAL_COMMUNITY): Payer: Self-pay

## 2022-01-09 ENCOUNTER — Ambulatory Visit: Payer: BC Managed Care – PPO | Admitting: Psychology

## 2022-01-09 MED ORDER — OXYCODONE HCL 15 MG PO TABS
15.0000 mg | ORAL_TABLET | ORAL | 0 refills | Status: DC | PRN
  Filled 2022-01-09: qty 180, 30d supply, fill #0

## 2022-01-09 MED ORDER — HYDROMORPHONE HCL 8 MG PO TABS
8.0000 mg | ORAL_TABLET | Freq: Every day | ORAL | 0 refills | Status: DC | PRN
  Filled 2022-01-09: qty 30, 30d supply, fill #0

## 2022-01-10 ENCOUNTER — Other Ambulatory Visit (HOSPITAL_COMMUNITY): Payer: Self-pay

## 2022-01-12 ENCOUNTER — Other Ambulatory Visit (HOSPITAL_COMMUNITY): Payer: Self-pay

## 2022-01-14 ENCOUNTER — Ambulatory Visit (INDEPENDENT_AMBULATORY_CARE_PROVIDER_SITE_OTHER): Payer: BC Managed Care – PPO | Admitting: Psychology

## 2022-01-14 ENCOUNTER — Other Ambulatory Visit: Payer: Self-pay | Admitting: Family Medicine

## 2022-01-14 DIAGNOSIS — F339 Major depressive disorder, recurrent, unspecified: Secondary | ICD-10-CM | POA: Diagnosis not present

## 2022-01-14 NOTE — Progress Notes (Signed)
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 (Status: maintained) -- No Description Entered  Medication Status compliance  Safety none  If Suicidal or Homicidal State Action Taken: unspecified  Current Risk: low Medications unspecified Objectives Related Problem: Partners verbally express empathy toward each other and demonstrate shared responsibility for reconstructing the relationship. Description: Clarify what level of trust exists for various aspects of the relationship. Target Date: 2022-05-27 Frequency: Daily Modality: individual Progress: 50%  Related Problem: Partners verbally express empathy toward each other and demonstrate shared responsibility for reconstructing the relationship. Description: List relationship issues that contribute to dissatisfaction and unhappiness and agree to address these in future conjoint sessions. Target Date: 2022-05-27 Frequency: Daily Modality: individual Progress: 40%  Related Problem: Partners verbally express empathy toward each other and demonstrate shared responsibility for reconstructing the relationship. Description: Identify and list behavioral changes for self and partner that would enhance the relationship. Target Date: 2022-05-27 Frequency: Daily Modality: individual Progress: 40%  Related Problem: Partners verbally express empathy toward each other and demonstrate shared responsibility for reconstructing the relationship. Description: Describe pre-affair relationship functioning. Target Date: 2022-05-27 Frequency: Daily Modality:  individual Progress: 50%  Related Problem: Partners verbally express empathy toward each other and demonstrate shared responsibility for reconstructing the relationship. Description: Ask the partners to generate factors that they believe contributed to the affair. Target Date: 2022-05-27 Frequency: Daily Modality: individual Progress: 60%   Problem: Partners verbally express empathy toward each other and demonstrate shared responsibility for reconstructing the relationship. Description: Hurt partner uses anxiety-management techniques to deal with intrusive thoughts about the affair. Target Date: 2022-05-27 Frequency: Daily Modality: individual Progress: 40%  Client Response full compliance  Service Location Location, [address] [place], [place] [address]  Service Code cpt [phone]  Self care activities  Provide education, information  Emotion regulation skills  Validate/empathize  Identify/label emotions  Facilitate problem solving  Comments  Patient agrees to a video session. They are at home and I am in my home office.  Dx.: Depression  Meds: Xanax. Tried antidep. meds, but she has significant side-effects. Goals: They are seeking counseling to resolve significant marital conflict secondary to infidelity. [name] has experienced symptoms of both anxiety and depression during this period and is hoping to reduce these symptoms as the relationship issues improve. [name] is experiencing some depressive symptoms as well, along with considerable anger and frustration. Goal date 12-23   [name]: She states that she longs for the life and relationship she had in the past. She hates that life is harder and she feels that she has lost the relationship with her daughter and husband and her mother (who passed). She talked about not having a source of income. She does not have disability even though she has a condition that would qualify. She is waiting to see if [name] will get therapy and  improve. She says are far better than they were in the past. [name] is supposed to get into therapy with Lennice Sites, but still hasn't scheduled an appointment. He has, however, filled out the appropriate paperwork.  [name] talked about her many insecurities,  both in and out of the bedroom. There are limits to what she can do and she says it "makes her sad". At this point, she is waiting to see if he can give her what she needs/wants emotionally.                                                                             [name], Ph.D.  Time:5:10p-6:00p 

## 2022-01-20 DIAGNOSIS — U071 COVID-19: Secondary | ICD-10-CM | POA: Diagnosis not present

## 2022-01-23 ENCOUNTER — Ambulatory Visit (INDEPENDENT_AMBULATORY_CARE_PROVIDER_SITE_OTHER): Payer: BC Managed Care – PPO | Admitting: Psychology

## 2022-01-23 DIAGNOSIS — F339 Major depressive disorder, recurrent, unspecified: Secondary | ICD-10-CM | POA: Diagnosis not present

## 2022-01-23 NOTE — Progress Notes (Signed)
Date: 01/23/2022  Treatment Plan Diagnosis 296.33 (Major depressive affective disorder, recurrent episode, severe, without mention of psychotic behavior) [n/a]  F43.22 (Adjustment Disorder, With anxiety) [n/a]  Symptoms One partner engages in sexual behavior (e.g., penile-vaginal intercourse, oral sex, anal sex) that violates the explicit or implicit expectations of the relationship. (Status: maintained) -- No Description Entered  Medication Status compliance  Safety none  If Suicidal or Homicidal State Action Taken: unspecified  Current Risk: low Medications unspecified Objectives Related Problem: Partners verbally express empathy toward each other and demonstrate shared responsibility for reconstructing the relationship. Description: Clarify what level of trust exists for various aspects of the relationship. Target Date: 2022-05-27 Frequency: Daily Modality: individual Progress: 50%  Related Problem: Partners verbally express empathy toward each other and demonstrate shared responsibility for reconstructing the relationship. Description: List relationship issues that contribute to dissatisfaction and unhappiness and agree to address these in future conjoint sessions. Target Date: 2022-05-27 Frequency: Daily Modality: individual Progress: 40%  Related Problem: Partners verbally express empathy toward each other and demonstrate shared responsibility for reconstructing the relationship. Description: Identify and list behavioral changes for self and partner that would enhance the relationship. Target Date: 2022-05-27 Frequency: Daily Modality: individual Progress: 40%  Related Problem: Partners verbally express empathy toward each other and demonstrate shared responsibility for reconstructing the relationship. Description: Describe pre-affair relationship functioning. Target Date: 2022-05-27 Frequency: Daily Modality: individual Progress:  50%  Related Problem: Partners verbally express empathy toward each other and demonstrate shared responsibility for reconstructing the relationship. Description: Ask the partners to generate factors that they believe contributed to the affair. Target Date: 2022-05-27 Frequency: Daily Modality: individual Progress: 60%   Problem: Partners verbally express empathy toward each other and demonstrate shared responsibility for reconstructing the relationship. Description: Hurt partner uses anxiety-management techniques to deal with intrusive thoughts about the affair. Target Date: 2022-05-27 Frequency: Daily Modality: individual Progress: 40%  Client Response full compliance  Service Location Location, [address] [place], [place] [address]  Service Code cpt [phone]  Self care activities  Provide education, information  Emotion regulation skills  Validate/empathize  Identify/label emotions  Facilitate problem solving  Comments  Patient agrees to a video session. They are at home and I am in my home office.  Dx.: Depression  Meds: Xanax. Tried antidep. meds, but she has significant side-effects. Goals: They are seeking counseling to resolve significant marital conflict secondary to infidelity. [name] has experienced symptoms of both anxiety and depression during this period and is hoping to reduce these symptoms as the relationship issues improve. [name] is experiencing some depressive symptoms as well, along with considerable anger and frustration. Goal date 12-23   [name]: She states that "nothing bad going on, and says that her daughter has been reaching out more lately. She is glad to have more contact with her and is hoping that her daughter will seek some counseling. She and [name] will having dinner with their daughter and her boyfriend on Saturday night.  [name] has not gotten an appointment with [name] and he is not taking his depression medicine. [name] will call to get him  appointment because he is unable to make the call. She is more encouraged lately. She does say that her financial insecurity is one of the things that has kept her in the marriage. If she had greater financial means, she would feel she had more options. We talked about her co-dependence  on [name].                                                                                [name], Ph.D.  Time:11:40a-12:30a 

## 2022-01-27 NOTE — Progress Notes (Incomplete)
                Taygen Newsome LEWIS, PhD 

## 2022-01-28 ENCOUNTER — Ambulatory Visit: Payer: BC Managed Care – PPO | Admitting: Psychology

## 2022-01-29 ENCOUNTER — Ambulatory Visit: Payer: BC Managed Care – PPO | Admitting: Psychology

## 2022-01-29 DIAGNOSIS — F339 Major depressive disorder, recurrent, unspecified: Secondary | ICD-10-CM

## 2022-01-29 NOTE — Progress Notes (Signed)
Date: 01/29/2022  Treatment Plan Diagnosis 296.33 (Major depressive affective disorder, recurrent episode, severe, without mention of psychotic behavior) [n/a]  F43.22 (Adjustment Disorder, With anxiety) [n/a]  Symptoms One partner engages in sexual behavior (e.g., penile-vaginal intercourse, oral sex, anal sex) that violates the explicit or implicit expectations of the relationship. (Status: maintained) -- No Description Entered  Medication Status compliance  Safety none  If Suicidal or Homicidal State Action Taken: unspecified  Current Risk: low Medications unspecified Objectives Related Problem: Partners verbally express empathy toward each other and demonstrate shared responsibility for reconstructing the relationship. Description: Clarify what level of trust exists for various aspects of the relationship. Target Date: 2022-05-27 Frequency: Daily Modality: individual Progress: 50%  Related Problem: Partners verbally express empathy toward each other and demonstrate shared responsibility for reconstructing the relationship. Description: List relationship issues that contribute to dissatisfaction and unhappiness and agree to address these in future conjoint sessions. Target Date: 2022-05-27 Frequency: Daily Modality: individual Progress: 40%  Related Problem: Partners verbally express empathy toward each other and demonstrate shared responsibility for reconstructing the relationship. Description: Identify and list behavioral changes for self and partner that would enhance the relationship. Target Date: 2022-05-27 Frequency: Daily Modality: individual Progress: 40%  Related Problem: Partners verbally express empathy toward each other and demonstrate shared responsibility for reconstructing the relationship. Description: Describe pre-affair relationship functioning. Target Date: 2022-05-27 Frequency: Daily Modality: individual Progress: 50%  Related Problem: Partners  verbally express empathy toward each other and demonstrate shared responsibility for reconstructing the relationship. Description: Ask the partners to generate factors that they believe contributed to the affair. Target Date: 2022-05-27 Frequency: Daily Modality: individual Progress: 60%   Problem: Partners verbally express empathy toward each other and demonstrate shared responsibility for reconstructing the relationship. Description: Hurt partner uses anxiety-management techniques to deal with intrusive thoughts about the affair. Target Date: 2022-05-27 Frequency: Daily Modality: individual Progress: 40%  Client Response full compliance  Service Location Location, [address] [place], [place] [address]  Service Code cpt [phone]  Self care activities  Provide education, information  Emotion regulation skills  Validate/empathize  Identify/label emotions  Facilitate problem solving  Comments  Patient agrees to a video session. They are at home and I am in my home office.  Dx.: Depression  Meds: Xanax. Tried antidep. meds, but she has significant side-effects. Goals: They are seeking counseling to resolve significant marital conflict secondary to infidelity. [name] has experienced symptoms of both anxiety and depression during this period and is hoping to reduce these symptoms as the relationship issues improve. [name] is experiencing some depressive symptoms as well, along with considerable anger and frustration. Goal date 12-23   Arieal and [name]: He says that he and [name] have been in more conflict this week. He feels that whatever answer he gives, she will "reject the answer". They continue to have conflicts with each other. She is very distressed that [name] makes no efforts to be kind or romantic to her. She says he never shows any interest in making her feel loved. He deflects and stays angry. He feels Makana will never be happy no matter what he does or says. He says "I do  things I think are nice but I do not do the things she says she wants". She stays focused on "he will never do for me what he did for his girlfriend". "He is not willing to do for me what he did for her and makes me feel like he doesn't care". I suggested she  stay at home next week instead of coming to [place].                                                                                   [name], Ph.D.  Time:5:10p-6:00p 

## 2022-01-30 ENCOUNTER — Other Ambulatory Visit (HOSPITAL_COMMUNITY): Payer: Self-pay

## 2022-01-30 ENCOUNTER — Other Ambulatory Visit: Payer: Self-pay | Admitting: Family Medicine

## 2022-01-30 MED ORDER — AMPHETAMINE-DEXTROAMPHET ER 30 MG PO CP24
30.0000 mg | ORAL_CAPSULE | Freq: Every day | ORAL | 0 refills | Status: DC
Start: 2022-01-30 — End: 2022-03-05
  Filled 2022-01-30: qty 30, 30d supply, fill #0

## 2022-01-30 NOTE — Telephone Encounter (Signed)
Dr. Claris Che patient.  Last PMV-11/04/21 Last refill-12/26/21--60 tabs, 0 refills  No future OV scheduled.

## 2022-02-03 ENCOUNTER — Other Ambulatory Visit (HOSPITAL_COMMUNITY): Payer: Self-pay

## 2022-02-06 ENCOUNTER — Ambulatory Visit: Payer: BC Managed Care – PPO | Admitting: Psychology

## 2022-02-09 ENCOUNTER — Other Ambulatory Visit (HOSPITAL_COMMUNITY): Payer: Self-pay

## 2022-02-09 ENCOUNTER — Telehealth (INDEPENDENT_AMBULATORY_CARE_PROVIDER_SITE_OTHER): Payer: BC Managed Care – PPO | Admitting: Family Medicine

## 2022-02-09 ENCOUNTER — Encounter: Payer: Self-pay | Admitting: Family Medicine

## 2022-02-09 DIAGNOSIS — F1190 Opioid use, unspecified, uncomplicated: Secondary | ICD-10-CM

## 2022-02-09 DIAGNOSIS — M48061 Spinal stenosis, lumbar region without neurogenic claudication: Secondary | ICD-10-CM

## 2022-02-09 MED ORDER — OXYCODONE HCL 15 MG PO TABS: 15.0000 mg | ORAL_TABLET | ORAL | 0 refills | Status: DC | PRN

## 2022-02-09 MED ORDER — HYDROMORPHONE HCL 8 MG PO TABS: 8.0000 mg | ORAL_TABLET | Freq: Every day | ORAL | 0 refills | Status: DC | PRN

## 2022-02-09 MED ORDER — HYDROMORPHONE HCL 8 MG PO TABS
8.0000 mg | ORAL_TABLET | Freq: Every day | ORAL | 0 refills | Status: DC | PRN
  Filled 2022-02-09: qty 30, 30d supply, fill #0

## 2022-02-09 MED ORDER — POTASSIUM CHLORIDE ER 10 MEQ PO TBCR
10.0000 meq | EXTENDED_RELEASE_TABLET | Freq: Every day | ORAL | 3 refills | Status: DC
  Filled 2022-02-09: qty 30, 30d supply, fill #0

## 2022-02-09 MED ORDER — OXYCODONE HCL 15 MG PO TABS
15.0000 mg | ORAL_TABLET | ORAL | 0 refills | Status: DC | PRN
  Filled 2022-02-09: qty 180, 30d supply, fill #0

## 2022-02-09 MED ORDER — SEMAGLUTIDE (1 MG/DOSE) 4 MG/3ML ~~LOC~~ SOPN
1.0000 mg | PEN_INJECTOR | SUBCUTANEOUS | 2 refills | Status: DC
  Filled 2022-02-09: qty 3, 28d supply, fill #0
  Filled 2022-03-11: qty 3, 28d supply, fill #1

## 2022-02-09 NOTE — Progress Notes (Signed)
Subjective:    Patient ID: [name], female    DOB: 1975/08/02, 46 y.o.   MRN: [id number]  HPI Virtual Visit via Video Note  I connected with the patient on 02/09/22 at  2:30 PM EDT by a video enabled telemedicine application and verified that I am speaking with the correct person using two identifiers.  Location patient: home Location provider:work or home office Persons participating in the virtual visit: patient, provider  I discussed the limitations of evaluation and management by telemedicine and the availability of in person appointments. The patient expressed understanding and agreed to proceed.   HPI: Here for pain management. She is doing well. She will see Dr. [name] again soon.    ROS: See pertinent positives and negatives per HPI.  Past Medical History:  Diagnosis Date   Allergy    Anxiety    Arthritis    B12 deficiency    Chronic narcotic use    Depression    not currently    Dysrhythmia    hx tachy-was from medication nortriptylline   Esophagitis    rx   Fibromyalgia    GERD (gastroesophageal reflux disease)    History of echocardiogram    Echo 4/18: Mild concentric LVH, vigorous LVF, EF 65-70, normal wall motion, grade 1 diastolic dysfunction   History of kidney stones    Hyperlipidemia    Intervertebral disc protrusion 01/11/2014   Migraine    Obesity (BMI 35.0-39.9 without comorbidity)    Peripheral neuropathy    Polycystic ovaries    Pulmonary embolus (HCC)    Spinal stenosis, lumbar    Vitamin D deficiency     Past Surgical History:  Procedure Laterality Date   66 HOUR PH STUDY N/A 05/27/2016   Procedure: 24 HOUR PH STUDY;  Surgeon: Ruffin Frederick, MD;  Location: [facility];  Service: Gastroenterology;  Laterality: N/A;   ABDOMINAL EXPOSURE N/A 05/04/2018   Procedure: ABDOMINAL EXPOSURE;  Surgeon: Larina Earthly, MD;  Location: [facility] OR;  Service: Vascular;  Laterality: N/A;   ANTERIOR LUMBAR FUSION N/A 05/04/2018   Procedure:  Lumbar Five Sacral One Anterior lumbar interbody fusion;  Surgeon: Donalee Citrin, MD;  Location: [facility] OR;  Service: Neurosurgery;  Laterality: N/A;  Lumbar Five Sacral One Anterior lumbar interbody fusion   APPENDECTOMY     BACK SURGERY  2012   left laminectomy at L3-4 per Dr. Phoebe Perch    bladder tack  2012   CHOLECYSTECTOMY N/A 10/03/2015   Procedure: LAPAROSCOPIC CHOLECYSTECTOMY;  Surgeon: Rodman Pickle, MD;  Location: [facility] OR;  Service: General;  Laterality: N/A;   ESOPHAGEAL MANOMETRY N/A 05/27/2016   Procedure: ESOPHAGEAL MANOMETRY (EM);  Surgeon: Ruffin Frederick, MD;  Location: [facility];  Service: Gastroenterology;  Laterality: N/A;   EXCISION OF SKIN TAG  10/03/2015   Procedure: EXCISION OF SKIN TAG;  Surgeon: De Blanch Kinsinger, MD;  Location: [facility] OR;  Service: General;;   LUMBAR DISC SURGERY  01-31-14   per Dr. [name]    OOPHORECTOMY     left overy   OVARIAN CYST REMOVAL     RECTOCELE REPAIR     SINUSOTOMY  12/14/06   spinal fusion  2016/10   TONSILLECTOMY     TONSILLECTOMY     VAGINAL HYSTERECTOMY     WRIST GANGLION EXCISION Left     Family History  Problem Relation Age of Onset   Fibromyalgia Mother    Diabetes Mother    Thyroid cancer Mother  Liver disease Mother    Neuropathy Mother    Cirrhosis Mother        non-alcoholic   Pulmonary embolism Mother        both lungs   Heart failure Mother    Hypertension Father    Diabetes Father    Heart disease Father    Prostate cancer Father    Heart attack Father        x 2   Colon cancer Neg Hx    Other Neg Hx        pheochromocytoma   Colon polyps Neg Hx      Current Outpatient Medications:    ALPRAZolam (XANAX) 0.5 MG tablet, TAKE 1 TABLET BY MOUTH THREE TIMES A DAY AS NEEDED, Disp: 90 tablet, Rfl: 5   amphetamine-dextroamphetamine (ADDERALL XR) 30 MG 24 hr capsule, Take 1 capsule (30 mg total) by mouth daily., Disp: 30 capsule, Rfl: 0   atorvastatin (LIPITOR) 20 MG tablet, Take 1 tablet (20 mg total) by  mouth daily., Disp: 90 tablet, Rfl: 3   B-D 3CC LUER-LOK SYR 25GX1" 25G X 1" 3 ML MISC, USE AS DIRECTED, Disp: 9 each, Rfl: 2   cetirizine (ZYRTEC) 10 MG tablet, Take 10 mg by mouth daily., Disp: , Rfl:    clobetasol (TEMOVATE) 0.05 % external solution, clobetasol 0.05 % scalp solution, Disp: , Rfl:    CYANOCOBALAMIN IJ, Inject as directed once a week., Disp: , Rfl:    cyclobenzaprine (FLEXERIL) 10 MG tablet, Take 1 tablet (10 mg total) by mouth 3 (three) times daily as needed for muscle spasms., Disp: 90 tablet, Rfl: 5   esomeprazole (NEXIUM) 40 MG capsule, Take 1 capsule (40 mg total) by mouth daily., Disp: 90 capsule, Rfl: 0   famotidine (PEPCID) 40 MG tablet, Take 1 tablet (40 mg total) by mouth at bedtime., Disp: 90 tablet, Rfl: 1   fluconazole (DIFLUCAN) 150 MG tablet, Take 1 tablet (150 mg total) by mouth daily. -For your yeast infection, start the Diflucan (fluconazole)- Take one pill today (day 1). If you're still having symptoms in 3 days, take the second pill., Disp: 2 tablet, Rfl: 0   Fluocinolone Acetonide Body 0.01 % OIL, fluocinolone 0.01 % scalp oil and shower cap, Disp: , Rfl:    fluticasone (FLONASE) 50 MCG/ACT nasal spray, Place 2 sprays into both nostrils daily., Disp: 16 g, Rfl: 6   furosemide (LASIX) 20 MG tablet, Take 2 tablets by mouth twice daily as needed for fluid., Disp: 90 tablet, Rfl: 1   gabapentin (NEURONTIN) 100 MG capsule, Take 1 capsule (100 mg total) by mouth 3 (three) times daily., Disp: 270 capsule, Rfl: 1   gabapentin (NEURONTIN) 300 MG capsule, Take 1 capsule (300 mg total) by mouth 3 (three) times daily., Disp: 270 capsule, Rfl: 1   hydrochlorothiazide (HYDRODIURIL) 25 MG tablet, TAKE 1 TABLET BY MOUTH EVERY DAY, Disp: 90 tablet, Rfl: 1   lidocaine (LIDODERM) 5 %, Place 1 patch onto the skin daily. Remove & Discard patch within 12 hours or as directed by MD, Disp: 30 patch, Rfl: 5   Multiple Vitamins-Minerals (MULTI-VITAMIN GUMMIES PO), Take 2 tablets by mouth  daily., Disp: , Rfl:    potassium chloride (KLOR-CON 10) 10 MEQ tablet, Take 1 tablet (10 mEq total) by mouth daily., Disp: 90 tablet, Rfl: 3   promethazine (PHENERGAN) 25 MG tablet, TAKE 1 TABLET BY MOUTH EVERY 6 HOURS AS NEEDED FOR NAUSEA, Disp: 90 tablet, Rfl: 1   Semaglutide, 1 MG/DOSE, 4  MG/3ML SOPN, Inject 1 mg as directed once a week., Disp: 3 mL, Rfl: 2   sucralfate (CARAFATE) 1 g tablet, Take 1 tablet (1 g total) by mouth 4 (four) times daily., Disp: 120 tablet, Rfl: 2   SUMAtriptan (IMITREX) 100 MG tablet, Take 1 tablet earliest onset of migraine.  May repeat once in 2 hours if headache persists or recurs., Disp: 10 tablet, Rfl: 2   Vitamin D, Ergocalciferol, (DRISDOL) 1.25 MG (50000 UNIT) CAPS capsule, Take 1 capsule (50,000 Units total) by mouth every 7 (seven) days., Disp: 12 capsule, Rfl: 3   [START ON 04/11/2022] HYDROmorphone (DILAUDID) 8 MG tablet, Take 1 tablet (8 mg total) by mouth daily as needed for severe pain., Disp: 30 tablet, Rfl: 0   [START ON 04/11/2022] oxyCODONE (ROXICODONE) 15 MG immediate release tablet, Take 1 tablet (15 mg total) by mouth every 4 (four) hours as needed for pain., Disp: 180 tablet, Rfl: 0  EXAM:  VITALS per patient if applicable:  GENERAL: alert, oriented, appears well and in no acute distress  HEENT: atraumatic, conjunttiva clear, no obvious abnormalities on inspection of external nose and ears  NECK: normal movements of the head and neck  LUNGS: on inspection no signs of respiratory distress, breathing rate appears normal, no obvious gross SOB, gasping or wheezing  CV: no obvious cyanosis  MS: moves all visible extremities without noticeable abnormality  PSYCH/NEURO: pleasant and cooperative, no obvious depression or anxiety, speech and thought processing grossly intact  ASSESSMENT AND PLAN: Pain management. Indication for chronic opioid: low back pain Medication and dose: Oxycodone 15 mg and Dilaudid 8 mg # pills per month: 180 and  30 Last UDS date: 08-04-21 Opioid Treatment Agreement signed (Y/N): 04-17-19 Opioid Treatment Agreement last reviewed with patient:  02-09-22 [facility] reviewed this encounter (include red flags): Yes Meds were refilled.  Gershon Crane, MD  Discussed the following assessment and plan:  No diagnosis found.     I discussed the assessment and treatment plan with the patient. The patient was provided an opportunity to ask questions and all were answered. The patient agreed with the plan and demonstrated an understanding of the instructions.   The patient was advised to call back or seek an in-person evaluation if the symptoms worsen or if the condition fails to improve as anticipated.      Review of Systems     Objective:   Physical Exam        Assessment & Plan:

## 2022-02-09 NOTE — Addendum Note (Signed)
Addended by: Gershon Crane A on: 02/09/2022 03:34 PM   Modules accepted: Orders

## 2022-02-10 ENCOUNTER — Other Ambulatory Visit (HOSPITAL_COMMUNITY): Payer: Self-pay

## 2022-02-11 ENCOUNTER — Other Ambulatory Visit (HOSPITAL_COMMUNITY): Payer: Self-pay

## 2022-02-11 ENCOUNTER — Ambulatory Visit (INDEPENDENT_AMBULATORY_CARE_PROVIDER_SITE_OTHER): Payer: BC Managed Care – PPO | Admitting: Psychology

## 2022-02-11 DIAGNOSIS — F339 Major depressive disorder, recurrent, unspecified: Secondary | ICD-10-CM

## 2022-02-11 NOTE — Progress Notes (Signed)
Date: 02/11/2022  Treatment Plan Diagnosis 296.33 (Major depressive affective disorder, recurrent episode, severe, without mention of psychotic behavior) [n/a]  F43.22 (Adjustment Disorder, With anxiety) [n/a]  Symptoms One partner engages in sexual behavior (e.g., penile-vaginal intercourse, oral sex, anal sex) that violates the explicit or implicit expectations of the relationship. (Status: maintained) -- No Description Entered  Medication Status compliance  Safety none  If Suicidal or Homicidal State Action Taken: unspecified  Current Risk: low Medications unspecified Objectives Related Problem: Partners verbally express empathy toward each other and demonstrate shared responsibility for reconstructing the relationship. Description: Clarify what level of trust exists for various aspects of the relationship. Target Date: 2022-05-27 Frequency: Daily Modality: individual Progress: 50%  Related Problem: Partners verbally express empathy toward each other and demonstrate shared responsibility for reconstructing the relationship. Description: List relationship issues that contribute to dissatisfaction and unhappiness and agree to address these in future conjoint sessions. Target Date: 2022-05-27 Frequency: Daily Modality: individual Progress: 40%  Related Problem: Partners verbally express empathy toward each other and demonstrate shared responsibility for reconstructing the relationship. Description: Identify and list behavioral changes for self and partner that would enhance the relationship. Target Date: 2022-05-27 Frequency: Daily Modality: individual Progress: 40%  Related Problem: Partners verbally express empathy toward each other and demonstrate shared responsibility for reconstructing the relationship. Description: Describe pre-affair relationship functioning. Target Date: 2022-05-27 Frequency: Daily Modality: individual Progress: 50%  Related Problem: Partners  verbally express empathy toward each other and demonstrate shared responsibility for reconstructing the relationship. Description: Ask the partners to generate factors that they believe contributed to the affair. Target Date: 2022-05-27 Frequency: Daily Modality: individual Progress: 60%   Problem: Partners verbally express empathy toward each other and demonstrate shared responsibility for reconstructing the relationship. Description: Hurt partner uses anxiety-management techniques to deal with intrusive thoughts about the affair. Target Date: 2022-05-27 Frequency: Daily Modality: individual Progress: 40%  Client Response full compliance  Service Location Location, [address] [place], [place] [address]  Service Code cpt [phone]  Self care activities  Provide education, information  Emotion regulation skills  Validate/empathize  Identify/label emotions  Facilitate problem solving  Comments  Patient agrees to a video session. They are at home and I am in my home office.  Dx.: Depression  Meds: Xanax. Tried antidep. meds, but she has significant side-effects. Goals: They are seeking counseling to resolve significant marital conflict secondary to infidelity. [name] has experienced symptoms of both anxiety and depression during this period and is hoping to reduce these symptoms as the relationship issues improve. [name] is experiencing some depressive symptoms as well, along with considerable anger and frustration. Goal date 12-23   Nastassja and [name]: She never did separate to go home after last session. [name] had a session with Alene Mires and she said that he was a bit "sweeter" after he met for his session. Deane talked about her family history of bipolar, and she grew up with outbursts from her father. Her father was also frequently cheating on her mother and treated her poorly. They expressed concerns about their son's irresponsible behaviors. Coached them about setting appropriate  limits and boundaries. They did say their daughter ids "coming around' and that her behavior  and interactions with them has improved.  Vendela and [name] are being more cooperative with each other and each is slightly more optimistic.  [name], Ph.D.  Time:5:10p-6:00p 15minutes

## 2022-02-12 ENCOUNTER — Other Ambulatory Visit (HOSPITAL_COMMUNITY): Payer: Self-pay

## 2022-02-14 ENCOUNTER — Other Ambulatory Visit (HOSPITAL_COMMUNITY): Payer: Self-pay

## 2022-02-20 ENCOUNTER — Ambulatory Visit: Payer: BC Managed Care – PPO | Admitting: Psychology

## 2022-02-20 DIAGNOSIS — U071 COVID-19: Secondary | ICD-10-CM | POA: Diagnosis not present

## 2022-02-25 ENCOUNTER — Ambulatory Visit (INDEPENDENT_AMBULATORY_CARE_PROVIDER_SITE_OTHER): Payer: BC Managed Care – PPO | Admitting: Psychology

## 2022-02-25 DIAGNOSIS — F339 Major depressive disorder, recurrent, unspecified: Secondary | ICD-10-CM

## 2022-02-25 NOTE — Progress Notes (Signed)
Date: 02/25/2022  Treatment Plan Diagnosis 296.33 (Major depressive affective disorder, recurrent episode, severe, without mention of psychotic behavior) [n/a]  F43.22 (Adjustment Disorder, With anxiety) [n/a]  Symptoms One partner engages in sexual behavior (e.g., penile-vaginal intercourse, oral sex, anal sex) that violates the explicit or implicit expectations of the relationship. (Status: maintained) -- No Description Entered  Medication Status compliance  Safety none  If Suicidal or Homicidal State Action Taken: unspecified  Current Risk: low Medications unspecified Objectives Related Problem: Partners verbally express empathy toward each other and demonstrate shared responsibility for reconstructing the relationship. Description: Clarify what level of trust exists for various aspects of the relationship. Target Date: 2022-05-27 Frequency: Daily Modality: individual Progress: 50%  Related Problem: Partners verbally express empathy toward each other and demonstrate shared responsibility for reconstructing the relationship. Description: List relationship issues that contribute to dissatisfaction and unhappiness and agree to address these in future conjoint sessions. Target Date: 2022-05-27 Frequency: Daily Modality: individual Progress: 40%  Related Problem: Partners verbally express empathy toward each other and demonstrate shared responsibility for reconstructing the relationship. Description: Identify and list behavioral changes for self and partner that would enhance the relationship. Target Date: 2022-05-27 Frequency: Daily Modality: individual Progress: 40%  Related Problem: Partners verbally express empathy toward each other and demonstrate shared responsibility for reconstructing the relationship. Description: Describe pre-affair relationship functioning. Target Date: 2022-05-27 Frequency: Daily Modality: individual Progress:  50%  Related Problem: Partners verbally express empathy toward each other and demonstrate shared responsibility for reconstructing the relationship. Description: Ask the partners to generate factors that they believe contributed to the affair. Target Date: 2022-05-27 Frequency: Daily Modality: individual Progress: 60%   Problem: Partners verbally express empathy toward each other and demonstrate shared responsibility for reconstructing the relationship. Description: Hurt partner uses anxiety-management techniques to deal with intrusive thoughts about the affair. Target Date: 2022-05-27 Frequency: Daily Modality: individual Progress: 40%  Client Response full compliance  Service Location Location, [address] [place], [place] [address]  Service Code cpt [phone]  Self care activities  Provide education, information  Emotion regulation skills  Validate/empathize  Identify/label emotions  Facilitate problem solving  Comments  Patient agrees to a video session. They are at home and I am in my home office.  Dx.: Depression  Meds: Xanax. Tried antidep. meds, but she has significant side-effects. Goals: They are seeking counseling to resolve significant marital conflict secondary to infidelity. [name] has experienced symptoms of both anxiety and depression during this period and is hoping to reduce these symptoms as the relationship issues improve. [name] is experiencing some depressive symptoms as well, along with considerable anger and frustration. Goal date 12-23   Celicia and [name]: Asli says that she struggles with the concept of leaving the marriage and she feels [name] believes she would never do it. She said that the only way she would be able to leave would be to cut him off completely for a period of time. She said she would have to be blocked (phone, social media) for it to work. She also believes that he will "force her out" of the marriage where she has no other choice to  leave. They shared some of the conflicts they had during the week. Not resolved by end of session. She feels that he never makes effort and says horrible things, and he feels that she doesn't tell the truth or let him talk.  [name], Ph.D.  Time:5:10p-6:00p 20minutes

## 2022-03-05 ENCOUNTER — Other Ambulatory Visit (HOSPITAL_COMMUNITY): Payer: Self-pay

## 2022-03-05 ENCOUNTER — Other Ambulatory Visit: Payer: Self-pay | Admitting: Adult Health

## 2022-03-05 MED ORDER — AMPHETAMINE-DEXTROAMPHET ER 30 MG PO CP24
30.0000 mg | ORAL_CAPSULE | Freq: Every day | ORAL | 0 refills | Status: DC
  Filled 2022-03-05: qty 30, 30d supply, fill #0

## 2022-03-05 NOTE — Telephone Encounter (Signed)
Done

## 2022-03-06 ENCOUNTER — Ambulatory Visit: Payer: BC Managed Care – PPO | Admitting: Psychology

## 2022-03-11 ENCOUNTER — Other Ambulatory Visit (HOSPITAL_COMMUNITY): Payer: Self-pay

## 2022-03-11 ENCOUNTER — Other Ambulatory Visit: Payer: Self-pay | Admitting: Family Medicine

## 2022-03-11 ENCOUNTER — Ambulatory Visit: Payer: BC Managed Care – PPO | Admitting: Psychology

## 2022-03-11 MED ORDER — OXYCODONE HCL 15 MG PO TABS
15.0000 mg | ORAL_TABLET | ORAL | 0 refills | Status: DC | PRN
  Filled 2022-03-11: qty 180, 30d supply, fill #0
  Filled 2022-03-14: qty 137, 23d supply, fill #0

## 2022-03-11 MED ORDER — HYDROMORPHONE HCL 8 MG PO TABS
8.0000 mg | ORAL_TABLET | Freq: Every day | ORAL | 0 refills | Status: AC | PRN
  Filled 2022-03-11 – 2022-03-27 (×3): qty 30, 30d supply, fill #0

## 2022-03-11 NOTE — Telephone Encounter (Signed)
Both medications last refills- 02-09-22 Last VV-02-09-22  No future OV scheduled

## 2022-03-11 NOTE — Telephone Encounter (Signed)
Done for 30 days (she will need a PMV for the next refills)

## 2022-03-13 ENCOUNTER — Ambulatory Visit (INDEPENDENT_AMBULATORY_CARE_PROVIDER_SITE_OTHER): Payer: BC Managed Care – PPO | Admitting: Psychology

## 2022-03-13 DIAGNOSIS — F339 Major depressive disorder, recurrent, unspecified: Secondary | ICD-10-CM | POA: Diagnosis not present

## 2022-03-13 NOTE — Progress Notes (Signed)
Date: 03/13/2022  Treatment Plan Diagnosis 296.33 (Major depressive affective disorder, recurrent episode, severe, without mention of psychotic behavior) [n/a]  F43.22 (Adjustment Disorder, With anxiety) [n/a]  Symptoms One partner engages in sexual behavior (e.g., penile-vaginal intercourse, oral sex, anal sex) that violates the explicit or implicit expectations of the relationship. (Status: maintained) -- No Description Entered  Medication Status compliance  Safety none  If Suicidal or Homicidal State Action Taken: unspecified  Current Risk: low Medications unspecified Objectives Related Problem: Partners verbally express empathy toward each other and demonstrate shared responsibility for reconstructing the relationship. Description: Clarify what level of trust exists for various aspects of the relationship. Target Date: 2022-05-27 Frequency: Daily Modality: individual Progress: 70%  Related Problem: Partners verbally express empathy toward each other and demonstrate shared responsibility for reconstructing the relationship. Description: List relationship issues that contribute to dissatisfaction and unhappiness and agree to address these in future conjoint sessions. Target Date: 2022-05-27 Frequency: Daily Modality: individual Progress: 50%  Related Problem: Partners verbally express empathy toward each other and demonstrate shared responsibility for reconstructing the relationship. Description: Identify and list behavioral changes for self and partner that would enhance the relationship. Target Date: 2022-05-27 Frequency: Daily Modality: individual Progress: 50%  Related Problem: Partners verbally express empathy toward each other and demonstrate shared responsibility for reconstructing the relationship. Description: Describe pre-affair relationship functioning. Target Date: 2022-05-27 Frequency: Daily Modality: individual Progress: 70%  Related Problem: Partners  verbally express empathy toward each other and demonstrate shared responsibility for reconstructing the relationship. Description: Ask the partners to generate factors that they believe contributed to the affair. Target Date: 2022-05-27 Frequency: Daily Modality: individual Progress: 70%   Problem: Partners verbally express empathy toward each other and demonstrate shared responsibility for reconstructing the relationship. Description: Hurt partner uses anxiety-management techniques to deal with intrusive thoughts about the affair. Target Date: 2022-05-27 Frequency: Daily Modality: individual Progress: 50%  Client Response full compliance  Service Location Location, [address] [place], [place] [address]  Service Code cpt [phone]  Self care activities  Provide education, information  Emotion regulation skills  Validate/empathize  Identify/label emotions  Facilitate problem solving  Comments  Patient agrees to a video session. They are at home and I am in my home office.  Dx.: Depression  Meds: Xanax. Tried antidep. meds, but she has significant side-effects. Goals: They are seeking counseling to resolve significant marital conflict secondary to infidelity. [name] has experienced symptoms of both anxiety and depression during this period and is hoping to reduce these symptoms as the relationship issues improve. [name] is experiencing some depressive symptoms as well, along with considerable anger and frustration. Goal date 12-23   [name] and [name]: She states she is gratified that [name] is in therapy. He will not, however, take medicine for depression because of the sexual side-effects. We talked about him trying alternative medicines and she will discuss with him. She says his problems exist even without the medicine. He is reluctant to try anything else. She also does not think that he is addressing his marital issues in therapy. Instead, he is talking about their kids and his work.  She still is okay with him looking at what is triggering his anger. They share their feelings of disappointment about their son but differ in their perspective of their daughter. They are "hung up" on what could have been different rather than what can happen now to improve relationship with daughter. Suggested that they need to move forward without "blame".  [name], Ph.D.  [id number] 61minutes

## 2022-03-14 ENCOUNTER — Other Ambulatory Visit (HOSPITAL_COMMUNITY): Payer: Self-pay

## 2022-03-16 ENCOUNTER — Other Ambulatory Visit (HOSPITAL_COMMUNITY): Payer: Self-pay

## 2022-03-19 ENCOUNTER — Ambulatory Visit (INDEPENDENT_AMBULATORY_CARE_PROVIDER_SITE_OTHER): Payer: BC Managed Care – PPO | Admitting: Psychology

## 2022-03-19 DIAGNOSIS — F339 Major depressive disorder, recurrent, unspecified: Secondary | ICD-10-CM

## 2022-03-19 NOTE — Progress Notes (Signed)
Date: 03/19/2022  Treatment Plan Diagnosis 296.33 (Major depressive affective disorder, recurrent episode, severe, without mention of psychotic behavior) [n/a]  F43.22 (Adjustment Disorder, With anxiety) [n/a]  Symptoms One partner engages in sexual behavior (e.g., penile-vaginal intercourse, oral sex, anal sex) that violates the explicit or implicit expectations of the relationship. (Status: maintained) -- No Description Entered  Medication Status compliance  Safety none  If Suicidal or Homicidal State Action Taken: unspecified  Current Risk: low Medications unspecified Objectives Related Problem: Partners verbally express empathy toward each other and demonstrate shared responsibility for reconstructing the relationship. Description: Clarify what level of trust exists for various aspects of the relationship. Target Date: 2022-05-27 Frequency: Daily Modality: individual Progress: 70%  Related Problem: Partners verbally express empathy toward each other and demonstrate shared responsibility for reconstructing the relationship. Description: List relationship issues that contribute to dissatisfaction and unhappiness and agree to address these in future conjoint sessions. Target Date: 2022-05-27 Frequency: Daily Modality: individual Progress: 50%  Related Problem: Partners verbally express empathy toward each other and demonstrate shared responsibility for reconstructing the relationship. Description: Identify and list behavioral changes for self and partner that would enhance the relationship. Target Date: 2022-05-27 Frequency: Daily Modality: individual Progress: 50%  Related Problem: Partners verbally express empathy toward each other and demonstrate shared responsibility for reconstructing the relationship. Description: Describe pre-affair relationship functioning. Target Date: 2022-05-27 Frequency: Daily Modality: individual Progress:  70%  Related Problem: Partners verbally express empathy toward each other and demonstrate shared responsibility for reconstructing the relationship. Description: Ask the partners to generate factors that they believe contributed to the affair. Target Date: 2022-05-27 Frequency: Daily Modality: individual Progress: 70%   Problem: Partners verbally express empathy toward each other and demonstrate shared responsibility for reconstructing the relationship. Description: Hurt partner uses anxiety-management techniques to deal with intrusive thoughts about the affair. Target Date: 2022-05-27 Frequency: Daily Modality: individual Progress: 50%  Client Response full compliance  Service Location Location, [address] [place], [place] [address]  Service Code cpt [id number]  Self care activities  Provide education, information  Emotion regulation skills  Validate/empathize  Identify/label emotions  Facilitate problem solving  Comments  Patient agrees to a video session. They are at home and I am in my home office.  Dx.: Depression  Meds: Xanax. Tried antidep. meds, but she has significant side-effects. Goals: They are seeking counseling to resolve significant marital conflict secondary to infidelity. [name] has experienced symptoms of both anxiety and depression during this period and is hoping to reduce these symptoms as the relationship issues improve. [name] is experiencing some depressive symptoms as well, along with considerable anger and frustration. Goal date 12-23   [name] and [name]: She states she wanted to go home, bur her car is still not working and she could not leave. [name] is very upset. Today is the third day of not getting out of bed and not eating. She says she wants to leave but still loves him and doesn't know how to leave. He has resumed communication with the woman he was having an affair with. [name] found out and has tapes of the conversations. She feels she cannot  leave because of their finances. [name] says she has no way home at all. He says that he will drive her back. [name] is enraged and [name]. She cannot conceptualize life without [name] and does not feel she will be able to survive without him. He  claims he will take her home without getting into arguments with her. I met with her individually for a few minutes at the end of the session. Explored if she would be safe and she gave assurances that she is not going to hurt herself. Promises to reach out for help if that changes.                                                                                                    [name], Ph.D.  Time:2:10p-3:00p 60mnutes

## 2022-03-20 ENCOUNTER — Other Ambulatory Visit (HOSPITAL_COMMUNITY): Payer: Self-pay

## 2022-03-20 ENCOUNTER — Ambulatory Visit: Payer: BC Managed Care – PPO | Admitting: Psychology

## 2022-03-20 ENCOUNTER — Encounter: Payer: Self-pay | Admitting: Family Medicine

## 2022-03-20 MED ORDER — SEMAGLUTIDE (2 MG/DOSE) 8 MG/3ML ~~LOC~~ SOPN
2.0000 mg | PEN_INJECTOR | SUBCUTANEOUS | 5 refills | Status: DC
  Filled 2022-03-20: qty 3, 28d supply, fill #0
  Filled 2022-04-30: qty 3, 28d supply, fill #1
  Filled 2022-05-27 – 2022-06-06 (×2): qty 3, 28d supply, fill #2
  Filled 2022-06-30: qty 3, 28d supply, fill #3
  Filled 2022-07-31: qty 3, 28d supply, fill #4
  Filled 2022-09-03: qty 3, 28d supply, fill #5

## 2022-03-20 MED ORDER — PROMETHAZINE HCL 25 MG PO TABS
25.0000 mg | ORAL_TABLET | ORAL | 5 refills | Status: DC | PRN
  Filled 2022-03-20: qty 90, 15d supply, fill #0

## 2022-03-20 NOTE — Telephone Encounter (Signed)
I sent in the Phenergan. She will get better results losing weight  with higher dosages of the Semaglutide, so I sent in a RX for the 2 mg shot every week. If she is not seeing results after a month, We can increase the dose again

## 2022-03-22 DIAGNOSIS — U071 COVID-19: Secondary | ICD-10-CM | POA: Diagnosis not present

## 2022-03-24 ENCOUNTER — Ambulatory Visit (INDEPENDENT_AMBULATORY_CARE_PROVIDER_SITE_OTHER): Payer: BC Managed Care – PPO | Admitting: Psychology

## 2022-03-24 DIAGNOSIS — F339 Major depressive disorder, recurrent, unspecified: Secondary | ICD-10-CM

## 2022-03-24 NOTE — Progress Notes (Signed)
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 (Status: maintained) -- No Description Entered  Medication Status compliance  Safety none  If Suicidal or Homicidal State Action Taken: unspecified  Current Risk: low Medications unspecified Objectives Related Problem: Partners verbally express empathy toward each other and demonstrate shared responsibility for reconstructing the relationship. Description: Clarify what level of trust exists for various aspects of the relationship. Target Date: 2022-05-27 Frequency: Daily Modality: individual Progress: 70%  Related Problem: Partners verbally express empathy toward each other and demonstrate shared responsibility for reconstructing the relationship. Description: List relationship issues that contribute to dissatisfaction and unhappiness and agree to address these in future conjoint sessions. Target Date: 2022-05-27 Frequency: Daily Modality: individual Progress: 50%  Related Problem: Partners verbally express empathy toward each other and demonstrate shared responsibility for reconstructing the relationship. Description: Identify and list behavioral changes for self and partner that would enhance the relationship. Target Date: 2022-05-27 Frequency: Daily Modality: individual Progress: 50%  Related Problem: Partners verbally express empathy toward each other and demonstrate shared responsibility for reconstructing the relationship. Description: Describe pre-affair relationship functioning. Target Date: 2022-05-27 Frequency: Daily Modality: individual Progress: 70%  Related Problem: Partners  verbally express empathy toward each other and demonstrate shared responsibility for reconstructing the relationship. Description: Ask the partners to generate factors that they believe contributed to the affair. Target Date: 2022-05-27 Frequency: Daily Modality: individual Progress: 70%   Problem: Partners verbally express empathy toward each other and demonstrate shared responsibility for reconstructing the relationship. Description: Hurt partner uses anxiety-management techniques to deal with intrusive thoughts about the affair. Target Date: 2022-05-27 Frequency: Daily Modality: individual Progress: 50%  Client Response full compliance  Service Location Location, [address] [place], [place] [address]  Service Code cpt [phone]  Self care activities  Provide education, information  Emotion regulation skills  Validate/empathize  Identify/label emotions  Facilitate problem solving  Comments  Patient agrees to a video session. They are at home and I am in my home office.  Dx.: Depression  Meds: Xanax. Tried antidep. meds, but she has significant side-effects. Goals: They are seeking counseling to resolve significant marital conflict secondary to infidelity. Hannah [name] has experienced symptoms of both anxiety and depression during this period and [name] hoping to reduce these symptoms as the relationship issues improve. Hannah [name] [name] experiencing some depressive symptoms as well, along with considerable anger and frustration. Goal date 12-23   Hannah [name]: Hannah [name] still at the hotel and [name] not getting out of bed or taking care of herself. She claims she cannot go back to her home because of a variety of issues. They are locked in a very dysfunctional dynamic. He [name] enraged with her and she [name] "devastated". I told her she needs to get to the hospital either in [place] or [place]. She [name] taking her Xanax as prescribed 3 times a day. This has only been the recent conflict between them. Hannah [name]  that he will take her to the hospital but Hannah [name] resists. I told him he needs to call 911 to get her to hospital. She continues to perseverate about the state of the marriage and the trauma he has caused. Claims she [name] not suicidal. She [name] not able to care for herself and I  told her that she should not drive. Claims she does not think the hospital will help. Told her it [name] the best choice for her now. Hannah [name] left session and I texted him to call 911 to get her to the hospital. Did not respond after several requests. Hannah [name] did subsequently reports she was "doing okay". Continues to maintain she [name] not at all suicidal, but [name] debilitated by her despair and inability to leave this relationship. She states she will remain at the hotel until her car [name] fixed and she can drive back home.                                                                                                         Hannah [name], Ph.D.  Time:6:10p-7:20p 78minutes

## 2022-03-25 ENCOUNTER — Other Ambulatory Visit (HOSPITAL_COMMUNITY): Payer: Self-pay

## 2022-03-25 ENCOUNTER — Ambulatory Visit: Payer: BC Managed Care – PPO | Admitting: Psychology

## 2022-03-27 ENCOUNTER — Other Ambulatory Visit (HOSPITAL_COMMUNITY): Payer: Self-pay

## 2022-03-28 ENCOUNTER — Other Ambulatory Visit (HOSPITAL_COMMUNITY): Payer: Self-pay

## 2022-03-30 ENCOUNTER — Other Ambulatory Visit (HOSPITAL_COMMUNITY): Payer: Self-pay

## 2022-04-01 ENCOUNTER — Other Ambulatory Visit: Payer: Self-pay | Admitting: Family Medicine

## 2022-04-02 ENCOUNTER — Other Ambulatory Visit (HOSPITAL_COMMUNITY): Payer: Self-pay

## 2022-04-02 MED ORDER — AMPHETAMINE-DEXTROAMPHET ER 30 MG PO CP24
30.0000 mg | ORAL_CAPSULE | Freq: Every day | ORAL | 0 refills | Status: DC
  Filled 2022-04-06: qty 30, 30d supply, fill #0

## 2022-04-03 ENCOUNTER — Telehealth: Payer: Self-pay | Admitting: *Deleted

## 2022-04-03 ENCOUNTER — Ambulatory Visit: Payer: BC Managed Care – PPO | Admitting: Psychology

## 2022-04-03 NOTE — Patient Outreach (Signed)
  Care Coordination   04/03/2022 Name: [name] MRN: [id number] DOB: Aug 15, 1975   Care Coordination Outreach Attempts:  An unsuccessful telephone outreach was attempted today to offer the patient information about available care coordination services as a benefit of their health plan.   Follow Up Plan:  Additional outreach attempts will be made to offer the patient care coordination information and services.   Encounter Outcome:  No Answer  Care Coordination Interventions Activated:  No   Care Coordination Interventions:  No, not indicated    Raina Mina, RN Care Management Coordinator [place] Office [phone]

## 2022-04-06 ENCOUNTER — Other Ambulatory Visit (HOSPITAL_COMMUNITY): Payer: Self-pay

## 2022-04-07 ENCOUNTER — Encounter: Payer: Self-pay | Admitting: Family Medicine

## 2022-04-07 ENCOUNTER — Other Ambulatory Visit: Payer: Self-pay | Admitting: Family Medicine

## 2022-04-07 DIAGNOSIS — R197 Diarrhea, unspecified: Secondary | ICD-10-CM

## 2022-04-07 NOTE — Telephone Encounter (Signed)
Pt LOV was on 02/09/22 Last refill was done on 03/11/22 Please advise

## 2022-04-08 ENCOUNTER — Ambulatory Visit (INDEPENDENT_AMBULATORY_CARE_PROVIDER_SITE_OTHER): Payer: BC Managed Care – PPO | Admitting: Psychology

## 2022-04-08 DIAGNOSIS — F339 Major depressive disorder, recurrent, unspecified: Secondary | ICD-10-CM

## 2022-04-08 NOTE — Progress Notes (Signed)
Date: 04/08/2022  Treatment Plan Diagnosis 296.33 (Major depressive affective disorder, recurrent episode, severe, without mention of psychotic behavior) [n/a]  F43.22 (Adjustment Disorder, With anxiety) [n/a]  Symptoms One partner engages in sexual behavior (e.g., penile-vaginal intercourse, oral sex, anal sex) that violates the explicit or implicit expectations of the relationship. (Status: maintained) -- No Description Entered  Medication Status compliance  Safety none  If Suicidal or Homicidal State Action Taken: unspecified  Current Risk: low Medications unspecified Objectives Related Problem: Partners verbally express empathy toward each other and demonstrate shared responsibility for reconstructing the relationship. Description: Clarify what level of trust exists for various aspects of the relationship. Target Date: 2022-05-27 Frequency: Daily Modality: individual Progress: 70%  Related Problem: Partners verbally express empathy toward each other and demonstrate shared responsibility for reconstructing the relationship. Description: List relationship issues that contribute to dissatisfaction and unhappiness and agree to address these in future conjoint sessions. Target Date: 2022-05-27 Frequency: Daily Modality: individual Progress: 50%  Related Problem: Partners verbally express empathy toward each other and demonstrate shared responsibility for reconstructing the relationship. Description: Identify and list behavioral changes for self and partner that would enhance the relationship. Target Date: 2022-05-27 Frequency: Daily Modality: individual Progress: 50%  Related Problem: Partners verbally express empathy toward each other and demonstrate shared responsibility for reconstructing the relationship. Description: Describe pre-affair relationship functioning. Target Date: 2022-05-27 Frequency: Daily Modality: individual Progress: 70%  Related Problem: Partners  verbally express empathy toward each other and demonstrate shared responsibility for reconstructing the relationship. Description: Ask the partners to generate factors that they believe contributed to the affair. Target Date: 2022-05-27 Frequency: Daily Modality: individual Progress: 70%   Problem: Partners verbally express empathy toward each other and demonstrate shared responsibility for reconstructing the relationship. Description: Hurt partner uses anxiety-management techniques to deal with intrusive thoughts about the affair. Target Date: 2022-05-27 Frequency: Daily Modality: individual Progress: 50%  Client Response full compliance  Service Location Location, [address] [place], [place] [address]  Service Code cpt [phone]  Self care activities  Provide education, information  Emotion regulation skills  Validate/empathize  Identify/label emotions  Facilitate problem solving  Comments  Patient agrees to a video session. They are at home and I am in my home office.  Dx.: Depression  Meds: Xanax. Tried antidep. meds, but she has significant side-effects. Goals: They are seeking counseling to resolve significant marital conflict secondary to infidelity. [name] has experienced symptoms of both anxiety and depression during this period and is hoping to reduce these symptoms as the relationship issues improve. [name] is experiencing some depressive symptoms as well, along with considerable anger and frustration. Goal date 12-23   [name]:She states that she has not been in [place] with [name] until today. She came to help with her 72 month old grandchild. She will be there another two days.                   She shared how she had taped conversations that [name] had with his ex girlfriend. [name] is far better today than at the last session. She is still struggling about what to do in the relationship. She says that [name] continues to lie and not share everything with her. They have  been fine the past few days and anticipate they will be okay in the next week.  [name], Ph.D.  Time:5:10p-6:00p 

## 2022-04-09 ENCOUNTER — Other Ambulatory Visit (HOSPITAL_COMMUNITY): Payer: Self-pay

## 2022-04-09 MED ORDER — OXYCODONE HCL 15 MG PO TABS
15.0000 mg | ORAL_TABLET | ORAL | 0 refills | Status: DC | PRN
  Filled 2022-04-09: qty 42, 7d supply, fill #0

## 2022-04-09 NOTE — Telephone Encounter (Signed)
I sent in a 7 day supply of Oxycodone

## 2022-04-10 ENCOUNTER — Other Ambulatory Visit: Payer: Self-pay | Admitting: Family Medicine

## 2022-04-15 ENCOUNTER — Other Ambulatory Visit (HOSPITAL_COMMUNITY): Payer: Self-pay

## 2022-04-15 ENCOUNTER — Encounter: Payer: Self-pay | Admitting: Family Medicine

## 2022-04-15 ENCOUNTER — Other Ambulatory Visit: Payer: Self-pay | Admitting: Family Medicine

## 2022-04-15 MED ORDER — OXYCODONE HCL 15 MG PO TABS
15.0000 mg | ORAL_TABLET | ORAL | 0 refills | Status: DC | PRN
  Filled 2022-04-18: qty 180, 30d supply, fill #0

## 2022-04-15 NOTE — Telephone Encounter (Signed)
Done

## 2022-04-16 NOTE — Telephone Encounter (Signed)
Pt LOV was on 02/09/22 Last refill was done on 06/04/21 Please advise

## 2022-04-17 ENCOUNTER — Ambulatory Visit (INDEPENDENT_AMBULATORY_CARE_PROVIDER_SITE_OTHER): Payer: BC Managed Care – PPO | Admitting: Psychology

## 2022-04-17 DIAGNOSIS — F339 Major depressive disorder, recurrent, unspecified: Secondary | ICD-10-CM | POA: Diagnosis not present

## 2022-04-17 NOTE — Progress Notes (Signed)
Date: 04/17/2022  Treatment Plan Diagnosis 296.33 (Major depressive affective disorder, recurrent episode, severe, without mention of psychotic behavior) [n/a]  F43.22 (Adjustment Disorder, With anxiety) [n/a]  Symptoms One partner engages in sexual behavior (e.g., penile-vaginal intercourse, oral sex, anal sex) that violates the explicit or implicit expectations of the relationship. (Status: maintained) -- No Description Entered  Medication Status compliance  Safety none  If Suicidal or Homicidal State Action Taken: unspecified  Current Risk: low Medications unspecified Objectives Related Problem: Partners verbally express empathy toward each other and demonstrate shared responsibility for reconstructing the relationship. Description: Clarify what level of trust exists for various aspects of the relationship. Target Date: 2022-05-27 Frequency: Daily Modality: individual Progress: 70%  Related Problem: Partners verbally express empathy toward each other and demonstrate shared responsibility for reconstructing the relationship. Description: List relationship issues that contribute to dissatisfaction and unhappiness and agree to address these in future conjoint sessions. Target Date: 2022-05-27 Frequency: Daily Modality: individual Progress: 50%  Related Problem: Partners verbally express empathy toward each other and demonstrate shared responsibility for reconstructing the relationship. Description: Identify and list behavioral changes for self and partner that would enhance the relationship. Target Date: 2022-05-27 Frequency: Daily Modality: individual Progress: 50%  Related Problem: Partners verbally express empathy toward each other and demonstrate shared responsibility for reconstructing the relationship. Description: Describe pre-affair relationship functioning. Target Date: 2022-05-27 Frequency: Daily Modality: individual Progress: 70%  Related Problem: Partners  verbally express empathy toward each other and demonstrate shared responsibility for reconstructing the relationship. Description: Ask the partners to generate factors that they believe contributed to the affair. Target Date: 2022-05-27 Frequency: Daily Modality: individual Progress: 70%   Problem: Partners verbally express empathy toward each other and demonstrate shared responsibility for reconstructing the relationship. Description: Hurt partner uses anxiety-management techniques to deal with intrusive thoughts about the affair. Target Date: 2022-05-27 Frequency: Daily Modality: individual Progress: 50%  Client Response full compliance  Service Location Location, [address] [place], [place] [address]  Service Code cpt [phone]  Self care activities  Provide education, information  Emotion regulation skills  Validate/empathize  Identify/label emotions  Facilitate problem solving  Comments  Patient agrees to a video session. They are at home and I am in my home office.  Dx.: Depression  Meds: Xanax. Tried antidep. meds, but she has significant side-effects. Goals: They are seeking counseling to resolve significant marital conflict secondary to infidelity. [name] has experienced symptoms of both anxiety and depression during this period and is hoping to reduce these symptoms as the relationship issues improve. [name] is experiencing some depressive symptoms as well, along with considerable anger and frustration. Goal date 12-23   Kierstin: She is back at her house today, but did stay in [place] with [name] for the week. Then, [name] wanted Sarahi to come back to [place] with him this week. They are going to the beach for Thanksgiving and then on a cruise together. She told him not to go on cruise if he plans to communicate with his ex girlfriend. She feels that he has never comforted her when she brings up her upset. He says he has comforted her 1 time in the past. We talked about  [name] being able to give Lavina reassurances. [name] says he is willing to try to give her reassurances. Melvia does not believe him.  [name], Ph.D.  Time:12:10p-1:00p 

## 2022-04-18 ENCOUNTER — Other Ambulatory Visit (HOSPITAL_COMMUNITY): Payer: Self-pay

## 2022-04-22 ENCOUNTER — Ambulatory Visit: Payer: BC Managed Care – PPO | Admitting: Psychology

## 2022-04-22 DIAGNOSIS — U071 COVID-19: Secondary | ICD-10-CM | POA: Diagnosis not present

## 2022-04-30 ENCOUNTER — Other Ambulatory Visit (HOSPITAL_COMMUNITY): Payer: Self-pay

## 2022-05-01 ENCOUNTER — Ambulatory Visit: Payer: BC Managed Care – PPO | Admitting: Psychology

## 2022-05-04 ENCOUNTER — Other Ambulatory Visit (HOSPITAL_COMMUNITY): Payer: Self-pay

## 2022-05-04 ENCOUNTER — Other Ambulatory Visit: Payer: Self-pay | Admitting: Family Medicine

## 2022-05-04 ENCOUNTER — Encounter: Payer: Self-pay | Admitting: Family Medicine

## 2022-05-04 MED ORDER — HYDROMORPHONE HCL 8 MG PO TABS
8.0000 mg | ORAL_TABLET | Freq: Every day | ORAL | 0 refills | Status: DC
  Filled 2022-05-04: qty 30, 30d supply, fill #0

## 2022-05-04 NOTE — Telephone Encounter (Signed)
Done

## 2022-05-05 NOTE — Telephone Encounter (Addendum)
Last refill-04/06/22--30 capsules-0 refills Last VV-02/09/22  No future OV scheduled

## 2022-05-06 ENCOUNTER — Other Ambulatory Visit (HOSPITAL_COMMUNITY): Payer: Self-pay

## 2022-05-06 ENCOUNTER — Ambulatory Visit (INDEPENDENT_AMBULATORY_CARE_PROVIDER_SITE_OTHER): Payer: BC Managed Care – PPO | Admitting: Psychology

## 2022-05-06 DIAGNOSIS — F339 Major depressive disorder, recurrent, unspecified: Secondary | ICD-10-CM

## 2022-05-06 MED ORDER — AMPHETAMINE-DEXTROAMPHET ER 30 MG PO CP24
30.0000 mg | ORAL_CAPSULE | Freq: Every day | ORAL | 0 refills | Status: DC
  Filled 2022-05-06: qty 30, 30d supply, fill #0

## 2022-05-06 NOTE — Progress Notes (Signed)
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 (Status: maintained) -- No Description Entered  Medication Status compliance  Safety none  If Suicidal or Homicidal State Action Taken: unspecified  Current Risk: low Medications unspecified Objectives Related Problem: Partners verbally express empathy toward each other and demonstrate shared responsibility for reconstructing the relationship. Description: Clarify what level of trust exists for various aspects of the relationship. Target Date: 2022-11-26 Frequency: Daily Modality: individual Progress: 70%  Related Problem: Partners verbally express empathy toward each other and demonstrate shared responsibility for reconstructing the relationship. Description: List relationship issues that contribute to dissatisfaction and unhappiness and agree to address these in future conjoint sessions. Target Date: 2022-11-26 Frequency: Daily Modality: individual Progress: 60%  Related Problem: Partners verbally express empathy toward each other and demonstrate shared responsibility for reconstructing the relationship. Description: Identify and list behavioral changes for self and partner that would enhance the relationship. Target Date: 2022-11-26 Frequency: Daily Modality: individual Progress: 70%  Related Problem: Partners verbally express empathy toward each other and demonstrate shared responsibility for reconstructing the relationship. Description: Describe pre-affair relationship functioning. Target Date: 2022-11-26 Frequency: Daily Modality: individual Progress: 50%  Related Problem: Partners verbally  express empathy toward each other and demonstrate shared responsibility for reconstructing the relationship. Description: Ask the partners to generate factors that they believe contributed to the affair. Target Date: 2022-11-26 Frequency: Daily Modality: individual Progress: 70%   Problem: Partners verbally express empathy toward each other and demonstrate shared responsibility for reconstructing the relationship. Description: Hurt partner uses anxiety-management techniques to deal with intrusive thoughts about the affair. Target Date: 2022-11-26 Frequency: Daily Modality: individual Progress: 50%  Client Response full compliance  Service Location Location, [address] [place], [place] [address]  Service Code cpt [phone]  Self care activities  Provide education, information  Emotion regulation skills  Validate/empathize  Identify/label emotions  Facilitate problem solving  Comments  Patient agrees to a video session. They are at home and I am in my home office.  Dx.: Depression  Meds: Xanax. Tried antidep. meds, but she has significant side-effects. Goals: They are seeking counseling to resolve significant marital conflict secondary to infidelity. [name] has experienced symptoms of both anxiety and depression during this period and is hoping to reduce these symptoms as the relationship issues improve. [name] is experiencing some depressive symptoms as well, along with considerable anger and frustration. Goal date 6-24   [name] and [name]: She and husband went to the beach for Thanksgiving. Immediate family plus the 2 girls they raised and her father. She said nobody helped and it was a hot mess. She said that she and [name] got along well until the day they were going to leave for the cruise. She cries saying that the family does not understand the daily pain she has to live with. The day they were going on cruise, she and [name] got into an argument. It lasted for a day, and then they did  okay on the rest of the cruise. Many of their current problems are about their kids. Their son has basically given up his rights to see his child and [name] and [name] now have no access to the child.  [name], Ph.D.  Time:5:05p-6:00p 

## 2022-05-08 ENCOUNTER — Other Ambulatory Visit (HOSPITAL_COMMUNITY): Payer: Self-pay

## 2022-05-09 ENCOUNTER — Other Ambulatory Visit (HOSPITAL_COMMUNITY): Payer: Self-pay

## 2022-05-15 ENCOUNTER — Ambulatory Visit (INDEPENDENT_AMBULATORY_CARE_PROVIDER_SITE_OTHER): Payer: BC Managed Care – PPO | Admitting: Psychology

## 2022-05-15 ENCOUNTER — Other Ambulatory Visit: Payer: Self-pay | Admitting: Family Medicine

## 2022-05-15 DIAGNOSIS — F339 Major depressive disorder, recurrent, unspecified: Secondary | ICD-10-CM | POA: Diagnosis not present

## 2022-05-15 NOTE — Progress Notes (Signed)
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 (Status: maintained) -- No Description Entered  Medication Status compliance  Safety none  If Suicidal or Homicidal State Action Taken: unspecified  Current Risk: low Medications unspecified Objectives Related Problem: Partners verbally express empathy toward each other and demonstrate shared responsibility for reconstructing the relationship. Description: Clarify what level of trust exists for various aspects of the relationship. Target Date: 2022-11-26 Frequency: Daily Modality: individual Progress: 70%  Related Problem: Partners verbally express empathy toward each other and demonstrate shared responsibility for reconstructing the relationship. Description: List relationship issues that contribute to dissatisfaction and unhappiness and agree to address these in future conjoint sessions. Target Date: 2022-11-26 Frequency: Daily Modality: individual Progress: 60%  Related Problem: Partners verbally express empathy toward each other and demonstrate shared responsibility for reconstructing the relationship. Description: Identify and list behavioral changes for self and partner that would enhance the relationship. Target Date: 2022-11-26 Frequency: Daily Modality: individual Progress: 70%  Related Problem: Partners verbally express empathy toward each other and demonstrate shared responsibility for reconstructing the relationship. Description: Describe pre-affair relationship functioning. Target Date: 2022-11-26 Frequency: Daily Modality: individual Progress:  50%  Related Problem: Partners verbally express empathy toward each other and demonstrate shared responsibility for reconstructing the relationship. Description: Ask the partners to generate factors that they believe contributed to the affair. Target Date: 2022-11-26 Frequency: Daily Modality: individual Progress: 70%   Problem: Partners verbally express empathy toward each other and demonstrate shared responsibility for reconstructing the relationship. Description: Hurt partner uses anxiety-management techniques to deal with intrusive thoughts about the affair. Target Date: 2022-11-26 Frequency: Daily Modality: individual Progress: 50%  Client Response full compliance  Service Location Location, [address] [place], [place] [address]  Service Code cpt [phone]  Self care activities  Provide education, information  Emotion regulation skills  Validate/empathize  Identify/label emotions  Facilitate problem solving  Comments  Patient agrees to a video session. They are at home and I am in my home office.  Dx.: Depression  Meds: Xanax. Tried antidep. meds, but she has significant side-effects. Goals: They are seeking counseling to resolve significant marital conflict secondary to infidelity. [name] has experienced symptoms of both anxiety and depression during this period and is hoping to reduce these symptoms as the relationship issues improve. [name] is experiencing some depressive symptoms as well, along with considerable anger and frustration. Goal date 6-24   [name]: [name] is not feeling well and will not enjoy the session. He talked about some of the issues with his kids that has been troubling. He has a hard time turning his back on what is obviously difficult problems for them. His son is having a number of difficulties and continues to "dig a deeper hole for himself". He says he is working to "let go" and not rescue/enable his son. He feels that he and [name] are on the same  page.  He states that he feels [name] is still focused on the comparison of how he treated his "girlfriend" versus how he treats her. He feels he needs to get his anger in control. We talked about how he needs to share feelings. Talked  through ways he can do this with a positive framework.                                                                                                      [name], Ph.D.  Time:11:40a-12:30p 

## 2022-05-18 NOTE — Telephone Encounter (Signed)
Last refill-04/18/22--180 tabs, 0 refills **Last VV-02/09/22  No future OV scheduled.  Is VV required before refill?

## 2022-05-18 NOTE — Telephone Encounter (Signed)
Message sent to patient to schedule PMV.

## 2022-05-18 NOTE — Telephone Encounter (Signed)
She needs a PMV 

## 2022-05-19 ENCOUNTER — Other Ambulatory Visit (HOSPITAL_COMMUNITY): Payer: Self-pay

## 2022-05-19 ENCOUNTER — Other Ambulatory Visit: Payer: Self-pay

## 2022-05-19 MED ORDER — OXYCODONE HCL 15 MG PO TABS
15.0000 mg | ORAL_TABLET | ORAL | 0 refills | Status: DC | PRN
  Filled 2022-05-19 – 2022-05-21 (×2): qty 30, 5d supply, fill #0

## 2022-05-19 MED ORDER — OXYCODONE HCL 15 MG PO TABS
15.0000 mg | ORAL_TABLET | ORAL | 0 refills | Status: DC | PRN
  Filled 2022-05-19: qty 180, 30d supply, fill #0

## 2022-05-19 NOTE — Telephone Encounter (Signed)
Asks if she can get a partial fill oxyCODONE (ROXICODONE) 15 MG to last until Friday.

## 2022-05-19 NOTE — Telephone Encounter (Signed)
I sent in a 5 day supply  

## 2022-05-19 NOTE — Telephone Encounter (Signed)
Dr. Clent Ridges see the My Chart message I sent to the patient on yesterday informing the patient that PMV is required.   Patient is requesting a 1 week supply.

## 2022-05-20 ENCOUNTER — Other Ambulatory Visit (HOSPITAL_COMMUNITY): Payer: Self-pay

## 2022-05-20 ENCOUNTER — Ambulatory Visit (INDEPENDENT_AMBULATORY_CARE_PROVIDER_SITE_OTHER): Payer: BC Managed Care – PPO | Admitting: Psychology

## 2022-05-20 ENCOUNTER — Other Ambulatory Visit: Payer: Self-pay

## 2022-05-20 DIAGNOSIS — F339 Major depressive disorder, recurrent, unspecified: Secondary | ICD-10-CM

## 2022-05-20 NOTE — Progress Notes (Signed)
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 (Status: maintained) -- No Description Entered  Medication Status compliance  Safety none  If Suicidal or Homicidal State Action Taken: unspecified  Current Risk: low Medications unspecified Objectives Related Problem: Partners verbally express empathy toward each other and demonstrate shared responsibility for reconstructing the relationship. Description: Clarify what level of trust exists for various aspects of the relationship. Target Date: 2022-11-26 Frequency: Daily Modality: individual Progress: 70%  Related Problem: Partners verbally express empathy toward each other and demonstrate shared responsibility for reconstructing the relationship. Description: List relationship issues that contribute to dissatisfaction and unhappiness and agree to address these in future conjoint sessions. Target Date: 2022-11-26 Frequency: Daily Modality: individual Progress: 60%  Related Problem: Partners verbally express empathy toward each other and demonstrate shared responsibility for reconstructing the relationship. Description: Identify and list behavioral changes for self and partner that would enhance the relationship. Target Date: 2022-11-26 Frequency: Daily Modality: individual Progress: 70%  Related Problem: Partners verbally express empathy toward each other and demonstrate shared responsibility for reconstructing the relationship. Description: Describe pre-affair relationship functioning. Target Date: 2022-11-26 Frequency: Daily Modality:  individual Progress: 50%  Related Problem: Partners verbally express empathy toward each other and demonstrate shared responsibility for reconstructing the relationship. Description: Ask the partners to generate factors that they believe contributed to the affair. Target Date: 2022-11-26 Frequency: Daily Modality: individual Progress: 70%   Problem: Partners verbally express empathy toward each other and demonstrate shared responsibility for reconstructing the relationship. Description: Hurt partner uses anxiety-management techniques to deal with intrusive thoughts about the affair. Target Date: 2022-11-26 Frequency: Daily Modality: individual Progress: 50%  Client Response full compliance  Service Location Location, [address] [place], [place] [address]  Service Code cpt [phone]  Self care activities  Provide education, information  Emotion regulation skills  Validate/empathize  Identify/label emotions  Facilitate problem solving  Comments  Patient agrees to a video session. They are at home and I am in my home office.  Dx.: Depression  Meds: Xanax. Tried antidep. meds, but she has significant side-effects. Goals: They are seeking counseling to resolve significant marital conflict secondary to infidelity. [name] has experienced symptoms of both anxiety and depression during this period and is hoping to reduce these symptoms as the relationship issues improve. [name] is experiencing some depressive symptoms as well, along with considerable anger and frustration. Goal date 6-24   [name] and Luz: [name] not feeling well today. Caileen says she is not really prepared for the holiday. They continue to have trouble with their children, who appear to manipulate and take advantage of them. We talked about strategies to respond to them without being enabling or being manipulated. They need to improve at setting limits.  [name], Ph.D.  Time:5:10p-6:00p 

## 2022-05-21 ENCOUNTER — Other Ambulatory Visit (HOSPITAL_COMMUNITY): Payer: Self-pay

## 2022-05-21 ENCOUNTER — Other Ambulatory Visit: Payer: Self-pay

## 2022-05-22 ENCOUNTER — Other Ambulatory Visit (HOSPITAL_COMMUNITY): Payer: Self-pay

## 2022-05-22 ENCOUNTER — Other Ambulatory Visit: Payer: Self-pay

## 2022-05-22 ENCOUNTER — Encounter: Payer: Self-pay | Admitting: Family Medicine

## 2022-05-22 ENCOUNTER — Telehealth (INDEPENDENT_AMBULATORY_CARE_PROVIDER_SITE_OTHER): Payer: BC Managed Care – PPO | Admitting: Family Medicine

## 2022-05-22 DIAGNOSIS — F1190 Opioid use, unspecified, uncomplicated: Secondary | ICD-10-CM

## 2022-05-22 DIAGNOSIS — M48061 Spinal stenosis, lumbar region without neurogenic claudication: Secondary | ICD-10-CM

## 2022-05-22 DIAGNOSIS — U071 COVID-19: Secondary | ICD-10-CM | POA: Diagnosis not present

## 2022-05-22 MED ORDER — CYANOCOBALAMIN 1000 MCG/ML IJ SOLN
1000.0000 ug | INTRAMUSCULAR | 11 refills | Status: DC
  Filled 2022-05-22: qty 4, 28d supply, fill #0
  Filled 2022-07-11: qty 4, 28d supply, fill #1
  Filled 2022-09-03: qty 4, 28d supply, fill #2
  Filled 2022-10-05: qty 4, 28d supply, fill #3
  Filled 2022-12-20: qty 4, 28d supply, fill #4
  Filled 2023-01-18: qty 4, 28d supply, fill #5
  Filled 2023-04-26: qty 4, 28d supply, fill #6

## 2022-05-22 MED ORDER — HYDROMORPHONE HCL 8 MG PO TABS
8.0000 mg | ORAL_TABLET | Freq: Every day | ORAL | 0 refills | Status: DC
  Filled 2022-06-03: qty 30, 30d supply, fill #0

## 2022-05-22 MED ORDER — OXYCODONE HCL 15 MG PO TABS
15.0000 mg | ORAL_TABLET | ORAL | 0 refills | Status: DC | PRN
  Filled 2022-05-22: qty 180, 30d supply, fill #0

## 2022-05-22 NOTE — Progress Notes (Signed)
Subjective:    Patient ID: [name], female    DOB: Sep 27, 1975, 46 y.o.   MRN: [id number]  HPI Virtual Visit via Video Note  I connected with the patient on 05/22/22 at 10:00 AM EST by a video enabled telemedicine application and verified that I am speaking with the correct person using two identifiers.  Location patient: home Location provider:work or home office Persons participating in the virtual visit: patient, provider  I discussed the limitations of evaluation and management by telemedicine and the availability of in person appointments. The patient expressed understanding and agreed to proceed.   HPI: Here for a pain management visit. She still deals with back pain every day, and lately she has had shooting pains in both feet when she is lying in bed at night. This pain is different from the numbness and tingling she usually feels. She plans to ask her back surgeon about this at her next visit with him.    ROS: See pertinent positives and negatives per HPI.  Past Medical History:  Diagnosis Date   Allergy    Anxiety    Arthritis    B12 deficiency    Chronic narcotic use    Depression    not currently    Dysrhythmia    hx tachy-was from medication nortriptylline   Esophagitis    rx   Fibromyalgia    GERD (gastroesophageal reflux disease)    History of echocardiogram    Echo 4/18: Mild concentric LVH, vigorous LVF, EF 65-70, normal wall motion, grade 1 diastolic dysfunction   History of kidney stones    Hyperlipidemia    Intervertebral disc protrusion 01/11/2014   Migraine    Obesity (BMI 35.0-39.9 without comorbidity)    Peripheral neuropathy    Polycystic ovaries    Pulmonary embolus ([place])    Spinal stenosis, lumbar    Vitamin D deficiency     Past Surgical History:  Procedure Laterality Date   39 HOUR [place] STUDY N/A 05/27/2016   Procedure: 24 HOUR [place] STUDY;  Surgeon: Manus Gunning, MD;  Location: [facility];  Service: Gastroenterology;   Laterality: N/A;   ABDOMINAL EXPOSURE N/A 05/04/2018   Procedure: ABDOMINAL EXPOSURE;  Surgeon: Rosetta Posner, MD;  Location: [facility];  Service: Vascular;  Laterality: N/A;   ANTERIOR LUMBAR FUSION N/A 05/04/2018   Procedure: Lumbar Five Sacral One Anterior lumbar interbody fusion;  Surgeon: Kary Kos, MD;  Location: [facility];  Service: Neurosurgery;  Laterality: N/A;  Lumbar Five Sacral One Anterior lumbar interbody fusion   APPENDECTOMY     BACK SURGERY  2012   left laminectomy at L3-4 per Dr. Luiz Ochoa    bladder tack  2012   CHOLECYSTECTOMY N/A 10/03/2015   Procedure: LAPAROSCOPIC CHOLECYSTECTOMY;  Surgeon: Mickeal Skinner, MD;  Location: [facility];  Service: General;  Laterality: N/A;   ESOPHAGEAL MANOMETRY N/A 05/27/2016   Procedure: ESOPHAGEAL MANOMETRY (EM);  Surgeon: Manus Gunning, MD;  Location: [facility];  Service: Gastroenterology;  Laterality: N/A;   EXCISION OF SKIN TAG  10/03/2015   Procedure: EXCISION OF SKIN TAG;  Surgeon: Arta Bruce Kinsinger, MD;  Location: [facility];  Service: General;;   LUMBAR [place]  01-31-14   per Dr. Saintclair Halsted    OOPHORECTOMY     left overy   OVARIAN CYST REMOVAL     RECTOCELE REPAIR     SINUSOTOMY  12/14/06   spinal fusion  2016/10   TONSILLECTOMY     TONSILLECTOMY  VAGINAL HYSTERECTOMY     WRIST GANGLION EXCISION Left     Family History  Problem Relation Age of Onset   Fibromyalgia Mother    Diabetes Mother    Thyroid cancer Mother    Liver disease Mother    Neuropathy Mother    Cirrhosis Mother        non-alcoholic   Pulmonary embolism Mother        both lungs   Heart failure Mother    Hypertension Father    Diabetes Father    Heart disease Father    Prostate cancer Father    Heart attack Father        x 2   Colon cancer Neg Hx    Other Neg Hx        pheochromocytoma   Colon polyps Neg Hx      Current Outpatient Medications:    ALPRAZolam (XANAX) 0.5 MG tablet, TAKE 1 TABLET BY MOUTH THREE TIMES A DAY AS NEEDED, Disp: 90  tablet, Rfl: 5   amphetamine-dextroamphetamine (ADDERALL XR) 30 MG 24 hr capsule, Take 1 capsule (30 mg total) by mouth daily., Disp: 30 capsule, Rfl: 0   atorvastatin (LIPITOR) 20 MG tablet, Take 1 tablet (20 mg total) by mouth daily., Disp: 90 tablet, Rfl: 3   B-D 3CC LUER-LOK SYR 25GX1" 25G X 1" 3 ML MISC, USE AS DIRECTED, Disp: 9 each, Rfl: 2   cetirizine (ZYRTEC) 10 MG tablet, Take 10 mg by mouth daily., Disp: , Rfl:    clobetasol (TEMOVATE) 0.05 % external solution, clobetasol 0.05 % scalp solution, Disp: , Rfl:    cyclobenzaprine (FLEXERIL) 10 MG tablet, Take 1 tablet (10 mg total) by mouth 3 (three) times daily as needed for muscle spasms., Disp: 90 tablet, Rfl: 5   esomeprazole (NEXIUM) 40 MG capsule, TAKE 1 CAPSULE BY MOUTH EVERY DAY, Disp: 90 capsule, Rfl: 0   famotidine (PEPCID) 40 MG tablet, Take 1 tablet (40 mg total) by mouth at bedtime., Disp: 90 tablet, Rfl: 1   fluconazole (DIFLUCAN) 150 MG tablet, Take 1 tablet (150 mg total) by mouth daily. -For your yeast infection, start the Diflucan (fluconazole)- Take one pill today (day 1). If you're still having symptoms in 3 days, take the second pill., Disp: 2 tablet, Rfl: 0   Fluocinolone Acetonide Body 0.01 % OIL, fluocinolone 0.01 % scalp oil and shower cap, Disp: , Rfl:    fluticasone (FLONASE) 50 MCG/ACT nasal spray, Place 2 sprays into both nostrils daily., Disp: 16 g, Rfl: 6   furosemide (LASIX) 20 MG tablet, Take 2 tablets by mouth twice daily as needed for fluid., Disp: 90 tablet, Rfl: 1   gabapentin (NEURONTIN) 100 MG capsule, Take 1 capsule (100 mg total) by mouth 3 (three) times daily., Disp: 270 capsule, Rfl: 1   gabapentin (NEURONTIN) 300 MG capsule, Take 1 capsule (300 mg total) by mouth 3 (three) times daily., Disp: 270 capsule, Rfl: 1   hydrochlorothiazide (HYDRODIURIL) 25 MG tablet, TAKE 1 TABLET BY MOUTH EVERY DAY, Disp: 90 tablet, Rfl: 1   lidocaine (LIDODERM) 5 %, Place 1 patch onto the skin daily. Remove & Discard  patch within 12 hours or as directed by MD, Disp: 30 patch, Rfl: 5   Multiple Vitamins-Minerals (MULTI-VITAMIN GUMMIES PO), Take 2 tablets by mouth daily., Disp: , Rfl:    potassium chloride (KLOR-CON 10) 10 MEQ tablet, Take 1 tablet (10 mEq total) by mouth daily., Disp: 90 tablet, Rfl: 3   promethazine (PHENERGAN) 25 MG  tablet, Take 1 tablet (25 mg total) by mouth every 4 (four) hours as needed for nausea or vomiting., Disp: 90 tablet, Rfl: 5   Semaglutide, 2 MG/DOSE, 8 MG/3ML SOPN, Inject 2 mg as directed once a week., Disp: 3 mL, Rfl: 5   sucralfate (CARAFATE) 1 g tablet, Take 1 tablet (1 g total) by mouth 4 (four) times daily., Disp: 120 tablet, Rfl: 2   SUMAtriptan (IMITREX) 100 MG tablet, Take 1 tablet earliest onset of migraine.  May repeat once in 2 hours if headache persists or recurs., Disp: 10 tablet, Rfl: 2   Vitamin D, Ergocalciferol, (DRISDOL) 1.25 MG (50000 UNIT) CAPS capsule, Take 1 capsule by mouth every 7 days., Disp: 12 capsule, Rfl: 3   cyanocobalamin (VITAMIN B12) 1000 MCG/ML injection, Inject 1 mL (1,000 mcg total) into the muscle once a week., Disp: 30 mL, Rfl: 11   [START ON 06/03/2022] HYDROmorphone (DILAUDID) 8 MG tablet, Take 1 tablet (8 mg total) by mouth at bedtime., Disp: 30 tablet, Rfl: 0   oxyCODONE (ROXICODONE) 15 MG immediate release tablet, Take 1 tablet (15 mg total) by mouth every 4 (four) hours as needed for pain., Disp: 180 tablet, Rfl: 0  EXAM:  VITALS per patient if applicable:  GENERAL: alert, oriented, appears well and in no acute distress  HEENT: atraumatic, conjunttiva clear, no obvious abnormalities on inspection of external nose and ears  NECK: normal movements of the head and neck  LUNGS: on inspection no signs of respiratory distress, breathing rate appears normal, no obvious gross SOB, gasping or wheezing  CV: no obvious cyanosis  MS: moves all visible extremities without noticeable abnormality  PSYCH/NEURO: pleasant and cooperative, no  obvious depression or anxiety, speech and thought processing grossly intact  ASSESSMENT AND PLAN: Pain management. Indication for chronic opioid: low back pain Medication and dose: Oxycodone 15 mg and Dilaudid 8 mg # pills per month: 180 and 30 Last UDS date: 08-04-21 Opioid Treatment Agreement signed (Y/N): 04-17-19 Opioid Treatment Agreement last reviewed with patient:  05-22-22 [facility] reviewed this encounter (include red flags): Yes Meds were refilled.  Alysia Penna, MD  Discussed the following assessment and plan:  No diagnosis found.     I discussed the assessment and treatment plan with the patient. The patient was provided an opportunity to ask questions and all were answered. The patient agreed with the plan and demonstrated an understanding of the instructions.   The patient was advised to call back or seek an in-person evaluation if the symptoms worsen or if the condition fails to improve as anticipated.      Review of Systems     Objective:   Physical Exam        Assessment & Plan:

## 2022-05-26 ENCOUNTER — Other Ambulatory Visit (HOSPITAL_COMMUNITY): Payer: Self-pay

## 2022-05-26 ENCOUNTER — Other Ambulatory Visit: Payer: Self-pay

## 2022-05-29 ENCOUNTER — Ambulatory Visit (INDEPENDENT_AMBULATORY_CARE_PROVIDER_SITE_OTHER): Payer: BC Managed Care – PPO | Admitting: Psychology

## 2022-05-29 DIAGNOSIS — F339 Major depressive disorder, recurrent, unspecified: Secondary | ICD-10-CM | POA: Diagnosis not present

## 2022-05-29 NOTE — Progress Notes (Signed)
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 (Status: maintained) -- No Description Entered  Medication Status compliance  Safety none  If Suicidal or Homicidal State Action Taken: unspecified  Current Risk: low Medications unspecified Objectives Related Problem: Partners verbally express empathy toward each other and demonstrate shared responsibility for reconstructing the relationship. Description: Clarify what level of trust exists for various aspects of the relationship. Target Date: 2022-11-26 Frequency: Daily Modality: individual Progress: 70%  Related Problem: Partners verbally express empathy toward each other and demonstrate shared responsibility for reconstructing the relationship. Description: List relationship issues that contribute to dissatisfaction and unhappiness and agree to address these in future conjoint sessions. Target Date: 2022-11-26 Frequency: Daily Modality: individual Progress: 60%  Related Problem: Partners verbally express empathy toward each other and demonstrate shared responsibility for reconstructing the relationship. Description: Identify and list behavioral changes for self and partner that would enhance the relationship. Target Date: 2022-11-26 Frequency: Daily Modality: individual Progress: 70%  Related Problem: Partners verbally express empathy toward each other and demonstrate shared responsibility for reconstructing the relationship. Description: Describe pre-affair relationship functioning. Target Date:  2022-11-26 Frequency: Daily Modality: individual Progress: 50%  Related Problem: Partners verbally express empathy toward each other and demonstrate shared responsibility for reconstructing the relationship. Description: Ask the partners to generate factors that they believe contributed to the affair. Target Date: 2022-11-26 Frequency: Daily Modality: individual Progress: 70%   Problem: Partners verbally express empathy toward each other and demonstrate shared responsibility for reconstructing the relationship. Description: Hurt partner uses anxiety-management techniques to deal with intrusive thoughts about the affair. Target Date: 2022-11-26 Frequency: Daily Modality: individual Progress: 50%  Client Response full compliance  Service Location Location, [address] [place], [place] [address]  Service Code cpt [phone]  Self care activities  Provide education, information  Emotion regulation skills  Validate/empathize  Identify/label emotions  Facilitate problem solving  Comments  Patient agrees to a video session. They are at home and I am in my home office.  Dx.: Depression  Meds: Xanax. Tried antidep. meds, but she has significant side-effects. Goals: They are seeking counseling to resolve significant marital conflict secondary to infidelity. [name] has experienced symptoms of both anxiety and depression during this period and is hoping to reduce these symptoms as the relationship issues improve. [name] is experiencing some depressive symptoms as well, along with considerable anger and frustration. Goal date 6-24   [name] and [name]: [name] went to the hospital after the last session and was admitted for several days with an intestinal obstruction. They ended up delaying Christmas until today. Their son continues to make irrational and irresponsible decisions. They both struggle to understand why their kids treat them so poorly and make such bad decisions. They also expressed their  conflicted feelings about having taken in their niece's to raise. They feel that taking them in had a negative impact on their family and biological children.  [name], Ph.D.  Time:11:45a-12:30p 45 minutes

## 2022-06-03 ENCOUNTER — Other Ambulatory Visit (HOSPITAL_COMMUNITY): Payer: Self-pay

## 2022-06-03 ENCOUNTER — Ambulatory Visit: Payer: BC Managed Care – PPO | Admitting: Psychology

## 2022-06-04 ENCOUNTER — Other Ambulatory Visit (HOSPITAL_COMMUNITY): Payer: Self-pay

## 2022-06-04 ENCOUNTER — Other Ambulatory Visit: Payer: Self-pay

## 2022-06-04 ENCOUNTER — Ambulatory Visit (INDEPENDENT_AMBULATORY_CARE_PROVIDER_SITE_OTHER): Payer: BC Managed Care – PPO | Admitting: Psychology

## 2022-06-04 DIAGNOSIS — F339 Major depressive disorder, recurrent, unspecified: Secondary | ICD-10-CM

## 2022-06-04 NOTE — Progress Notes (Signed)
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 (Status: maintained) -- No Description Entered  Medication Status compliance  Safety none  If Suicidal or Homicidal State Action Taken: unspecified  Current Risk: low Medications unspecified Objectives Related Problem: Partners verbally express empathy toward each other and demonstrate shared responsibility for reconstructing the relationship. Description: Clarify what level of trust exists for various aspects of the relationship. Target Date: 2022-11-26 Frequency: Daily Modality: individual Progress: 70%  Related Problem: Partners verbally express empathy toward each other and demonstrate shared responsibility for reconstructing the relationship. Description: List relationship issues that contribute to dissatisfaction and unhappiness and agree to address these in future conjoint sessions. Target Date: 2022-11-26 Frequency: Daily Modality: individual Progress: 60%  Related Problem: Partners verbally express empathy toward each other and demonstrate shared responsibility for reconstructing the relationship. Description: Identify and list behavioral changes for self and partner that would enhance the relationship. Target Date: 2022-11-26 Frequency: Daily Modality: individual Progress: 70%  Related Problem: Partners verbally express empathy toward each other and demonstrate shared responsibility for reconstructing the relationship. Description: Describe pre-affair relationship functioning. Target Date: 2022-11-26 Frequency: Daily Modality: individual Progress: 50%  Related Problem: Partners verbally  express empathy toward each other and demonstrate shared responsibility for reconstructing the relationship. Description: Ask the partners to generate factors that they believe contributed to the affair. Target Date: 2022-11-26 Frequency: Daily Modality: individual Progress: 70%   Problem: Partners verbally express empathy toward each other and demonstrate shared responsibility for reconstructing the relationship. Description: Hurt partner uses anxiety-management techniques to deal with intrusive thoughts about the affair. Target Date: 2022-11-26 Frequency: Daily Modality: individual Progress: 50%  Client Response full compliance  Service Location Location, [address] [place], [place] [address]  Service Code cpt [phone]  Self care activities  Provide education, information  Emotion regulation skills  Validate/empathize  Identify/label emotions  Facilitate problem solving  Comments  Patient agrees to a video session. They are at home and I am in my home office.  Dx.: Depression  Meds: Xanax. Tried antidep. meds, but she has significant side-effects. Goals: They are seeking counseling to resolve significant marital conflict secondary to infidelity. [name] has experienced symptoms of both anxiety and depression during this period and is hoping to reduce these symptoms as the relationship issues improve. [name] is experiencing some depressive symptoms as well, along with considerable anger and frustration. Goal date 6-24   [name] and Neelam: [name] is back at work and feeling okay. He says everyone showed up for the holiday, but they did not get to see their grandson because the mother would not let him come. They said their kids were both high and intoxicated. It was upsetting for both of them. They had one "disconnect" that ended up in argument between them. [name] got angry and "stormed off and went bowling". They cannot agree about the cause of the argument. We need to revisit the  circumstances in order to pass throught this obstacle.  [name], Ph.D.  Time:1:05p-2:00p 55 minutes

## 2022-06-05 ENCOUNTER — Other Ambulatory Visit: Payer: Self-pay | Admitting: Family Medicine

## 2022-06-05 ENCOUNTER — Other Ambulatory Visit (HOSPITAL_COMMUNITY): Payer: Self-pay

## 2022-06-06 ENCOUNTER — Other Ambulatory Visit (HOSPITAL_COMMUNITY): Payer: Self-pay

## 2022-06-08 ENCOUNTER — Other Ambulatory Visit (HOSPITAL_COMMUNITY): Payer: Self-pay

## 2022-06-08 NOTE — Telephone Encounter (Addendum)
Last VV-05/22/22 Last refill-05/06/22--30 Capsules , 0 refills  No future Ov scheduled.

## 2022-06-09 ENCOUNTER — Other Ambulatory Visit (HOSPITAL_COMMUNITY): Payer: Self-pay

## 2022-06-09 ENCOUNTER — Encounter: Payer: Self-pay | Admitting: Family Medicine

## 2022-06-10 ENCOUNTER — Other Ambulatory Visit (HOSPITAL_COMMUNITY): Payer: Self-pay

## 2022-06-10 ENCOUNTER — Other Ambulatory Visit: Payer: Self-pay

## 2022-06-10 MED ORDER — AMPHETAMINE-DEXTROAMPHET ER 30 MG PO CP24
30.0000 mg | ORAL_CAPSULE | Freq: Every day | ORAL | 0 refills | Status: DC
  Filled 2022-06-10 (×2): qty 30, 30d supply, fill #0

## 2022-06-10 NOTE — Telephone Encounter (Signed)
I refilled it to [facility] this morning

## 2022-06-11 ENCOUNTER — Other Ambulatory Visit (HOSPITAL_COMMUNITY): Payer: Self-pay

## 2022-06-12 ENCOUNTER — Ambulatory Visit: Payer: BC Managed Care – PPO | Admitting: Psychology

## 2022-06-17 ENCOUNTER — Ambulatory Visit: Payer: BC Managed Care – PPO | Admitting: Psychology

## 2022-06-18 ENCOUNTER — Ambulatory Visit (INDEPENDENT_AMBULATORY_CARE_PROVIDER_SITE_OTHER): Payer: BC Managed Care – PPO | Admitting: Psychology

## 2022-06-18 DIAGNOSIS — F339 Major depressive disorder, recurrent, unspecified: Secondary | ICD-10-CM

## 2022-06-18 NOTE — Progress Notes (Signed)
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 (Status: maintained) -- No Description Entered  Medication Status compliance  Safety none  If Suicidal or Homicidal State Action Taken: unspecified  Current Risk: low Medications unspecified Objectives Related Problem: Partners verbally express empathy toward each other and demonstrate shared responsibility for reconstructing the relationship. Description: Clarify what level of trust exists for various aspects of the relationship. Target Date: 2022-11-26 Frequency: Daily Modality: individual Progress: 70%  Related Problem: Partners verbally express empathy toward each other and demonstrate shared responsibility for reconstructing the relationship. Description: List relationship issues that contribute to dissatisfaction and unhappiness and agree to address these in future conjoint sessions. Target Date: 2022-11-26 Frequency: Daily Modality: individual Progress: 60%  Related Problem: Partners verbally express empathy toward each other and demonstrate shared responsibility for reconstructing the relationship. Description: Identify and list behavioral changes for self and partner that would enhance the relationship. Target Date: 2022-11-26 Frequency: Daily Modality: individual Progress: 70%  Related Problem: Partners verbally express empathy toward each other and demonstrate shared responsibility for reconstructing the relationship. Description: Describe pre-affair relationship functioning. Target Date: 2022-11-26 Frequency: Daily Modality: individual Progress: 50%  Related Problem: Partners verbally  express empathy toward each other and demonstrate shared responsibility for reconstructing the relationship. Description: Ask the partners to generate factors that they believe contributed to the affair. Target Date: 2022-11-26 Frequency: Daily Modality: individual Progress: 70%   Problem: Partners verbally express empathy toward each other and demonstrate shared responsibility for reconstructing the relationship. Description: Hurt partner uses anxiety-management techniques to deal with intrusive thoughts about the affair. Target Date: 2022-11-26 Frequency: Daily Modality: individual Progress: 50%  Client Response full compliance  Service Location Location, [address] [place], [place] [address]  Service Code cpt [phone]  Self care activities  Provide education, information  Emotion regulation skills  Validate/empathize  Identify/label emotions  Facilitate problem solving  Comments  Patient agrees to a video session. They are at home and I am in my home office.  Dx.: Depression  Meds: Xanax. Tried antidep. meds, but she has significant side-effects. Goals: They are seeking counseling to resolve significant marital conflict secondary to infidelity. [name] has experienced symptoms of both anxiety and depression during this period and is hoping to reduce these symptoms as the relationship issues improve. [name] is experiencing some depressive symptoms as well, along with considerable anger and frustration. Goal date 6-24   [name] and [name]: [name] says that they are doing well and have had very few conflicts. They are being more cooperative with each other. Bonded around their disappointment with their kids and their lack of responsibility. They are trying to accept that they may never have a relationship with their grandson (son's son). He feels that he and [name] are on the same page with regard to their perceptions of their kids and grandkids.  We talked about a trigger [name] has  when [name] shuts off communication with the statement "I don't want to argue". We agreed that they need to work on intimacy, both physical and emotional. [name] misses this, but it is less of an issue for [name].  [name], Ph.D.  Time:4:10p-5:00p 50 minutes

## 2022-06-22 DIAGNOSIS — U071 COVID-19: Secondary | ICD-10-CM | POA: Diagnosis not present

## 2022-06-24 ENCOUNTER — Other Ambulatory Visit: Payer: Self-pay | Admitting: Family Medicine

## 2022-06-24 ENCOUNTER — Other Ambulatory Visit (HOSPITAL_COMMUNITY): Payer: Self-pay

## 2022-06-24 MED ORDER — OXYCODONE HCL 15 MG PO TABS
15.0000 mg | ORAL_TABLET | ORAL | 0 refills | Status: AC | PRN
  Filled 2022-06-24 – 2022-06-25 (×2): qty 180, 30d supply, fill #0

## 2022-06-24 NOTE — Telephone Encounter (Signed)
Done

## 2022-06-24 NOTE — Telephone Encounter (Signed)
Pt LOV was on 05/22/22 Last refill done on 05/22/22 Please advise

## 2022-06-25 ENCOUNTER — Other Ambulatory Visit: Payer: Self-pay

## 2022-06-25 ENCOUNTER — Other Ambulatory Visit (HOSPITAL_COMMUNITY): Payer: Self-pay

## 2022-06-26 ENCOUNTER — Ambulatory Visit (INDEPENDENT_AMBULATORY_CARE_PROVIDER_SITE_OTHER): Payer: BC Managed Care – PPO | Admitting: Psychology

## 2022-06-26 DIAGNOSIS — F339 Major depressive disorder, recurrent, unspecified: Secondary | ICD-10-CM | POA: Diagnosis not present

## 2022-06-26 NOTE — Progress Notes (Signed)
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 (Status: maintained) -- No Description Entered  Medication Status compliance  Safety none  If Suicidal or Homicidal State Action Taken: unspecified  Current Risk: low Medications unspecified Objectives Related Problem: Partners verbally express empathy toward each other and demonstrate shared responsibility for reconstructing the relationship. Description: Clarify what level of trust exists for various aspects of the relationship. Target Date: 2022-11-26 Frequency: Daily Modality: individual Progress: 70%  Related Problem: Partners verbally express empathy toward each other and demonstrate shared responsibility for reconstructing the relationship. Description: List relationship issues that contribute to dissatisfaction and unhappiness and agree to address these in future conjoint sessions. Target Date: 2022-11-26 Frequency: Daily Modality: individual Progress: 60%  Related Problem: Partners verbally express empathy toward each other and demonstrate shared responsibility for reconstructing the relationship. Description: Identify and list behavioral changes for self and partner that would enhance the relationship. Target Date: 2022-11-26 Frequency: Daily Modality: individual Progress: 70%  Related Problem: Partners verbally express empathy toward each other and demonstrate shared responsibility for reconstructing the relationship. Description: Describe pre-affair relationship functioning. Target Date: 2022-11-26 Frequency: Daily Modality: individual Progress: 50%  Related Problem: Partners verbally  express empathy toward each other and demonstrate shared responsibility for reconstructing the relationship. Description: Ask the partners to generate factors that they believe contributed to the affair. Target Date: 2022-11-26 Frequency: Daily Modality: individual Progress: 70%   Problem: Partners verbally express empathy toward each other and demonstrate shared responsibility for reconstructing the relationship. Description: Hurt partner uses anxiety-management techniques to deal with intrusive thoughts about the affair. Target Date: 2022-11-26 Frequency: Daily Modality: individual Progress: 50%  Client Response full compliance  Service Location Location, [address] [place],  [address]  Service Code cpt [phone]  Self care activities  Provide education, information  Emotion regulation skills  Validate/empathize  Identify/label emotions  Facilitate problem solving  Comments  Patient agrees to a video session. They are at home and I am in my home office.  Dx.: Depression  Meds: Xanax. Tried antidep. meds, but she has significant side-effects. Goals: They are seeking counseling to resolve significant marital conflict secondary to infidelity. [name] has experienced symptoms of both anxiety and depression during this period and is hoping to reduce these symptoms as the relationship issues improve. [name] is experiencing some depressive symptoms as well, along with considerable anger and frustration. Goal date 6-24   [name] and [name]: [name] says he understands [name]'s need and desire for expressions of intimacy. He admits that he often misinterprets communications or jumps ahead or projects his feelings.He feels things are so much better because they do not argue like they did in the past. [name] has a hard time expressing what she needs from [name] without bringing up his affair and that he shared things with his girlfriend he will not say to her. He does things for [name], in his  opinion, that shows he loves her. She, however, is looking for his words of romantic expression from him. I suggested that [name] take this issue to his theapist to discuss why he struggles to share his feelings with [name]. He was apparently very expressive of his romantic feelings when talking with his "girlfriend", but is unable (unwilling?) to do  so with [name].                                                                                                                           [name], Ph.D.  Time:11:10a-12:30p 50 minutes

## 2022-06-30 ENCOUNTER — Other Ambulatory Visit: Payer: Self-pay

## 2022-06-30 ENCOUNTER — Other Ambulatory Visit (HOSPITAL_COMMUNITY): Payer: Self-pay

## 2022-07-01 ENCOUNTER — Ambulatory Visit: Payer: BC Managed Care – PPO | Admitting: Psychology

## 2022-07-03 ENCOUNTER — Other Ambulatory Visit: Payer: Self-pay | Admitting: Family Medicine

## 2022-07-03 NOTE — Telephone Encounter (Signed)
Pt LOV was on 05/22/22 Last refill was done on 06/03/22 Please advise

## 2022-07-06 ENCOUNTER — Other Ambulatory Visit (HOSPITAL_COMMUNITY): Payer: Self-pay

## 2022-07-06 ENCOUNTER — Other Ambulatory Visit: Payer: Self-pay

## 2022-07-06 MED ORDER — HYDROMORPHONE HCL 8 MG PO TABS
8.0000 mg | ORAL_TABLET | Freq: Every day | ORAL | 0 refills | Status: DC
  Filled 2022-07-06: qty 30, 30d supply, fill #0

## 2022-07-08 ENCOUNTER — Telehealth: Payer: Self-pay

## 2022-07-08 NOTE — Patient Instructions (Signed)
Visit Information  Thank you for taking time to visit with me today. Please don't hesitate to contact me if I can be of assistance to you.   Following are the goals we discussed today:   Goals Addressed             This Visit's Progress    COMPLETED: Care Coordination Activities-No follow up required       Interventions Today    Flowsheet Row Most Recent Value  Chronic Disease Discussed/Reviewed   Chronic disease discussed/reviewed during today's visit Hypertension (HTN)  General Interventions   General Interventions Discussed/Reviewed General Interventions Discussed, Health Screening, Doctor Visits  Doctor Visits Discussed/Reviewed Annual Wellness Visits  Health Screening Mammogram  Education Interventions   Education Provided Provided Verbal Education  Provided Verbal Education On When to see the doctor  [place] Discussed  [seeing behavioral health.]              If you are experiencing a Mental Health or [place] or need someone to talk to, please call the Suicide and Crisis Lifeline: 988   Patient verbalizes understanding of instructions and care plan provided today and agrees to view in [place]. Active MyChart status and patient understanding of how to access instructions and care plan via MyChart confirmed with patient.     No further follow up required: decline  Jone Baseman, RN, MSN [facility] Care Management Coordinator Direct Line [phone]

## 2022-07-08 NOTE — Patient Outreach (Signed)
  Care Coordination   Initial Visit Note   07/08/2022 Name: SIRI BUEGE MRN: [id number] DOB: April 19, 1976  Curley Spice Feeley is a 47 y.o. year old female who sees Laurey Morale, MD for primary care. I spoke with  Arsenia P Lipinski by phone today.  What matters to the patients health and wellness today?  none    Goals Addressed             This Visit's Progress    COMPLETED: Care Coordination Activities-No follow up required       Interventions Today    Flowsheet Row Most Recent Value  Chronic Disease Discussed/Reviewed   Chronic disease discussed/reviewed during today's visit Hypertension (HTN)  General Interventions   General Interventions Discussed/Reviewed General Interventions Discussed, Health Screening, Doctor Visits  Doctor Visits Discussed/Reviewed Annual Wellness Visits  Health Screening Mammogram  Education Interventions   Education Provided Provided Verbal Education  Provided Verbal Education On When to see the doctor  [place] Discussed  [seeing behavioral health.]              SDOH assessments and interventions completed:  Yes  SDOH Interventions Today    Flowsheet Row Most Recent Value  SDOH Interventions   Food Insecurity Interventions Intervention Not Indicated  Housing Interventions Intervention Not Indicated        Care Coordination Interventions:  Yes, provided   Follow up plan: No further intervention required.   Encounter Outcome:  Pt. Visit Completed   Jone Baseman, RN, MSN [facility] Care Management Coordinator Direct Line [phone]

## 2022-07-09 ENCOUNTER — Other Ambulatory Visit: Payer: Self-pay

## 2022-07-09 ENCOUNTER — Ambulatory Visit (INDEPENDENT_AMBULATORY_CARE_PROVIDER_SITE_OTHER): Payer: BC Managed Care – PPO | Admitting: Psychology

## 2022-07-09 DIAGNOSIS — F339 Major depressive disorder, recurrent, unspecified: Secondary | ICD-10-CM | POA: Diagnosis not present

## 2022-07-09 NOTE — Progress Notes (Addendum)
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 (Status: maintained) -- No Description Entered  Medication Status compliance  Safety none  If Suicidal or Homicidal State Action Taken: unspecified  Current Risk: low Medications unspecified Objectives Related Problem: Partners verbally express empathy toward each other and demonstrate shared responsibility for reconstructing the relationship. Description: Clarify what level of trust exists for various aspects of the relationship. Target Date: 2022-11-26 Frequency: Daily Modality: individual Progress: 70%  Related Problem: Partners verbally express empathy toward each other and demonstrate shared responsibility for reconstructing the relationship. Description: List relationship issues that contribute to dissatisfaction and unhappiness and agree to address these in future conjoint sessions. Target Date: 2022-11-26 Frequency: Daily Modality: individual Progress: 60%  Related Problem: Partners verbally express empathy toward each other and demonstrate shared responsibility for reconstructing the relationship. Description: Identify and list behavioral changes for self and partner that would enhance the relationship. Target Date: 2022-11-26 Frequency: Daily Modality: individual Progress: 70%  Related Problem: Partners verbally express empathy toward each other and demonstrate shared responsibility for reconstructing the relationship. Description: Describe pre-affair relationship functioning. Target Date: 2022-11-26 Frequency: Daily Modality: individual Progress:  50%  Related Problem: Partners verbally express empathy toward each other and demonstrate shared responsibility for reconstructing the relationship. Description: Ask the partners to generate factors that they believe contributed to the affair. Target Date: 2022-11-26 Frequency: Daily Modality: individual Progress: 70%   Problem: Partners verbally express empathy toward each other and demonstrate shared responsibility for reconstructing the relationship. Description: Hurt partner uses anxiety-management techniques to deal with intrusive thoughts about the affair. Target Date: 2022-11-26 Frequency: Daily Modality: individual Progress: 50%  Client Response full compliance  Service Location Location, [address] [place], [place] [address]  Service Code cpt [phone]  Self care activities  Provide education, information  Emotion regulation skills  Validate/empathize  Identify/label emotions  Facilitate problem solving  Comments  Patient agrees to a video session. They are at home and I am in my home office.  Dx.: Depression  Meds: Xanax. Tried antidep. meds, but she has significant side-effects. Goals: They are seeking counseling to resolve significant marital conflict secondary to infidelity. [name] has experienced symptoms of both anxiety and depression during this period and is hoping to reduce these symptoms as the relationship issues improve. [name] is experiencing some depressive symptoms as well, along with considerable anger and frustration. Goal date 6-24   [name] and [name]: [name] had to go to hospital last night with same GI problems. They told him to go to a GI doc. He has blockage. [name] says "this has been a good week". She had a birthday last week and it was a good day. This represents a significant improvement in [name] expressing positive feelings toward [name].  They were excited to have their granddaughter born a week ago Monday. Their son continues to be irresponsible  and inappropriate. We talked about ways to establish boundaries.  [name], Ph.D.  Time:12:10p-1:00p 50 minutes

## 2022-07-10 ENCOUNTER — Ambulatory Visit: Payer: BC Managed Care – PPO | Admitting: Psychology

## 2022-07-11 ENCOUNTER — Other Ambulatory Visit (HOSPITAL_COMMUNITY): Payer: Self-pay

## 2022-07-11 ENCOUNTER — Other Ambulatory Visit: Payer: Self-pay | Admitting: Family Medicine

## 2022-07-13 ENCOUNTER — Other Ambulatory Visit: Payer: Self-pay | Admitting: Family Medicine

## 2022-07-13 ENCOUNTER — Other Ambulatory Visit: Payer: Self-pay

## 2022-07-13 MED ORDER — ESOMEPRAZOLE MAGNESIUM 40 MG PO CPDR
40.0000 mg | DELAYED_RELEASE_CAPSULE | Freq: Every day | ORAL | 0 refills | Status: DC
  Filled 2022-07-13: qty 30, 30d supply, fill #0
  Filled 2022-08-12: qty 30, 30d supply, fill #1
  Filled 2022-09-17: qty 30, 30d supply, fill #2

## 2022-07-13 NOTE — Telephone Encounter (Signed)
Last VV-05/22/22 Last refill-06/10/22--90 tab, 0 refills  No future OV scheduled.

## 2022-07-14 ENCOUNTER — Other Ambulatory Visit: Payer: Self-pay | Admitting: Family Medicine

## 2022-07-14 ENCOUNTER — Other Ambulatory Visit (HOSPITAL_COMMUNITY): Payer: Self-pay

## 2022-07-14 ENCOUNTER — Encounter: Payer: Self-pay | Admitting: Family Medicine

## 2022-07-14 NOTE — Telephone Encounter (Signed)
Spoke with [facility] state that they have not received a refill for Adderall XR 74m. Please advise

## 2022-07-15 ENCOUNTER — Other Ambulatory Visit: Payer: Self-pay

## 2022-07-15 ENCOUNTER — Ambulatory Visit (INDEPENDENT_AMBULATORY_CARE_PROVIDER_SITE_OTHER): Payer: BC Managed Care – PPO | Admitting: Psychology

## 2022-07-15 DIAGNOSIS — F339 Major depressive disorder, recurrent, unspecified: Secondary | ICD-10-CM | POA: Diagnosis not present

## 2022-07-15 MED ORDER — AMPHETAMINE-DEXTROAMPHET ER 30 MG PO CP24
30.0000 mg | ORAL_CAPSULE | Freq: Every day | ORAL | 0 refills | Status: DC
  Filled 2022-07-15: qty 30, 30d supply, fill #0

## 2022-07-15 NOTE — Progress Notes (Signed)
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 (Status: maintained) -- No Description Entered  Medication Status compliance  Safety none  If Suicidal or Homicidal State Action Taken: unspecified  Current Risk: low Medications unspecified Objectives Related Problem: Partners verbally express empathy toward each other and demonstrate shared responsibility for reconstructing the relationship. Description: Clarify what level of trust exists for various aspects of the relationship. Target Date: 2022-11-26 Frequency: Daily Modality: individual Progress: 70%  Related Problem: Partners verbally express empathy toward each other and demonstrate shared responsibility for reconstructing the relationship. Description: List relationship issues that contribute to dissatisfaction and unhappiness and agree to address these in future conjoint sessions. Target Date: 2022-11-26 Frequency: Daily Modality: individual Progress: 60%  Related Problem: Partners verbally express empathy toward each other and demonstrate shared responsibility for reconstructing the relationship. Description: Identify and list behavioral changes for self and partner that would enhance the relationship. Target Date: 2022-11-26 Frequency: Daily Modality: individual Progress: 70%  Related Problem: Partners verbally express empathy toward each other and demonstrate shared responsibility for reconstructing the relationship. Description: Describe pre-affair relationship functioning. Target Date: 2022-11-26 Frequency: Daily Modality:  individual Progress: 50%  Related Problem: Partners verbally express empathy toward each other and demonstrate shared responsibility for reconstructing the relationship. Description: Ask the partners to generate factors that they believe contributed to the affair. Target Date: 2022-11-26 Frequency: Daily Modality: individual Progress: 70%   Problem: Partners verbally express empathy toward each other and demonstrate shared responsibility for reconstructing the relationship. Description: Hurt partner uses anxiety-management techniques to deal with intrusive thoughts about the affair. Target Date: 2022-11-26 Frequency: Daily Modality: individual Progress: 50%  Client Response full compliance  Service Location Location, [address] [place], [place] [address]  Service Code cpt [phone]  Self care activities  Provide education, information  Emotion regulation skills  Validate/empathize  Identify/label emotions  Facilitate problem solving  Comments  Patient agrees to a video session. They are at home and I am in my home office.  Dx.: Depression  Meds: Xanax. Tried antidep. meds, but she has significant side-effects. Goals: They are seeking counseling to resolve significant marital conflict secondary to infidelity. Jeannetta Nap has experienced symptoms of both anxiety and depression during this period and is hoping to reduce these symptoms as the relationship issues improve. [name] is experiencing some depressive symptoms as well, along with considerable anger and frustration. Goal date 6-24   [name] and Ameia: [name] is still waiting for appointment with GI doctor. He feels lots of stress at work and at home. Is not satisfied with his current team, but the project is only 60% done. Discussed options for other jobs, which includes working [place]. Pattricia says that they have been getting along better and have not had any arguments. She says he has done a good job of not bringing his work home. We  focused on what is "working" for them right now and the elements of successful communications. She feels there has been "lots of monumental changes". Her current issue is that "bad feelings" sometimes creeps in. These are usually  feelings of insecurity.                                                                                                                                 [name], Ph.D.  Time:5:10p-6:00p 50 minutes

## 2022-07-15 NOTE — Telephone Encounter (Signed)
Done

## 2022-07-16 NOTE — Telephone Encounter (Signed)
Pt last Virtual Visit was on 05/22/22 Last refill was done on 02/22/2021 Please advise

## 2022-07-21 DIAGNOSIS — G5773 Causalgia of bilateral lower limbs: Secondary | ICD-10-CM | POA: Diagnosis not present

## 2022-07-23 DIAGNOSIS — U071 COVID-19: Secondary | ICD-10-CM | POA: Diagnosis not present

## 2022-07-24 ENCOUNTER — Other Ambulatory Visit: Payer: Self-pay | Admitting: Family Medicine

## 2022-07-24 ENCOUNTER — Ambulatory Visit (INDEPENDENT_AMBULATORY_CARE_PROVIDER_SITE_OTHER): Payer: BC Managed Care – PPO | Admitting: Psychology

## 2022-07-24 DIAGNOSIS — F339 Major depressive disorder, recurrent, unspecified: Secondary | ICD-10-CM | POA: Diagnosis not present

## 2022-07-24 NOTE — Telephone Encounter (Signed)
Pt LOV was on 05/22/22 Last refill was done on 06/24/22 Please advise

## 2022-07-24 NOTE — Progress Notes (Signed)
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 (Status: maintained) -- No Description Entered  Medication Status compliance  Safety none  If Suicidal or Homicidal State Action Taken: unspecified  Current Risk: low Medications unspecified Objectives Related Problem: Partners verbally express empathy toward each other and demonstrate shared responsibility for reconstructing the relationship. Description: Clarify what level of trust exists for various aspects of the relationship. Target Date: 2022-11-26 Frequency: Daily Modality: individual Progress: 70%  Related Problem: Partners verbally express empathy toward each other and demonstrate shared responsibility for reconstructing the relationship. Description: List relationship issues that contribute to dissatisfaction and unhappiness and agree to address these in future conjoint sessions. Target Date: 2022-11-26 Frequency: Daily Modality: individual Progress: 60%  Related Problem: Partners verbally express empathy toward each other and demonstrate shared responsibility for reconstructing the relationship. Description: Identify and list behavioral changes for self and partner that would enhance the relationship. Target Date: 2022-11-26 Frequency: Daily Modality: individual Progress: 70%  Related Problem: Partners verbally express empathy toward each other and demonstrate shared responsibility for reconstructing the relationship. Description: Describe pre-affair relationship functioning. Target Date: 2022-11-26 Frequency: Daily Modality: individual Progress: 50%  Related Problem: Partners verbally  express empathy toward each other and demonstrate shared responsibility for reconstructing the relationship. Description: Ask the partners to generate factors that they believe contributed to the affair. Target Date: 2022-11-26 Frequency: Daily Modality: individual Progress: 70%   Problem: Partners verbally express empathy toward each other and demonstrate shared responsibility for reconstructing the relationship. Description: Hurt partner uses anxiety-management techniques to deal with intrusive thoughts about the affair. Target Date: 2022-11-26 Frequency: Daily Modality: individual Progress: 50%  Client Response full compliance  Service Location Location, [address] [place], [place] [address]  Service Code cpt [id number]  Self care activities  Provide education, information  Emotion regulation skills  Validate/empathize  Identify/label emotions  Facilitate problem solving  Comments  Patient agrees to a video session. They are at home and I am in my home office.  Dx.: Depression  Meds: Xanax. Tried antidep. meds, but she has significant side-effects. Goals: They are seeking counseling to resolve significant marital conflict secondary to infidelity. Jeannetta Nap has experienced symptoms of both anxiety and depression during this period and is hoping to reduce these symptoms as the relationship issues improve. [name] is experiencing some depressive symptoms as well, along with considerable anger and frustration. Goal date 6-24   [name] and Elverta: [name] continues to feel completely dissatisfied with his work. They state that it was a difficult week because they had an argument. She was upset that he saidsomething critical to her. When she confronted him, he "blew up" and broke things.He asked her to apologize and says she has not. He feels that he has to apologize all the time and she will not. He says he did not name call, which is unusual. She says that she thinks that his taking a testosterone  shot  contributed to his outburst. They are locked in a negative cycle for blaming each other for their inability to resolve conflict.  [name], Ph.D.  Time11:40a-12:30p 50 minutes

## 2022-07-27 ENCOUNTER — Other Ambulatory Visit (HOSPITAL_COMMUNITY): Payer: Self-pay

## 2022-07-27 ENCOUNTER — Encounter: Payer: Self-pay | Admitting: Family Medicine

## 2022-07-28 ENCOUNTER — Other Ambulatory Visit: Payer: Self-pay

## 2022-07-28 ENCOUNTER — Ambulatory Visit (INDEPENDENT_AMBULATORY_CARE_PROVIDER_SITE_OTHER): Payer: BC Managed Care – PPO | Admitting: Psychology

## 2022-07-28 ENCOUNTER — Other Ambulatory Visit (HOSPITAL_COMMUNITY): Payer: Self-pay

## 2022-07-28 DIAGNOSIS — F339 Major depressive disorder, recurrent, unspecified: Secondary | ICD-10-CM | POA: Diagnosis not present

## 2022-07-28 MED ORDER — OXYCODONE HCL 15 MG PO TABS
15.0000 mg | ORAL_TABLET | ORAL | 0 refills | Status: DC | PRN
  Filled 2022-07-28: qty 180, 30d supply, fill #0

## 2022-07-28 NOTE — Telephone Encounter (Signed)
I sent in another month of Oxycodone. Next month she will be due for her PMV. When patients use any [facility] pharmacy, we can only prescribe pain meds one month at a time. She needs to call us every month to refill them

## 2022-07-28 NOTE — Progress Notes (Signed)
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 (Status: maintained) -- No Description Entered  Medication Status compliance  Safety none  If Suicidal or Homicidal State Action Taken: unspecified  Current Risk: low Medications unspecified Objectives Related Problem: Partners verbally express empathy toward each other and demonstrate shared responsibility for reconstructing the relationship. Description: Clarify what level of trust exists for various aspects of the relationship. Target Date: 2022-11-26 Frequency: Daily Modality: individual Progress: 70%  Related Problem: Partners verbally express empathy toward each other and demonstrate shared responsibility for reconstructing the relationship. Description: List relationship issues that contribute to dissatisfaction and unhappiness and agree to address these in future conjoint sessions. Target Date: 2022-11-26 Frequency: Daily Modality: individual Progress: 60%  Related Problem: Partners verbally express empathy toward each other and demonstrate shared responsibility for reconstructing the relationship. Description: Identify and list behavioral changes for self and partner that would enhance the relationship. Target Date: 2022-11-26 Frequency: Daily Modality: individual Progress: 70%  Related Problem: Partners verbally express empathy toward each other and demonstrate shared responsibility for reconstructing the relationship. Description: Describe pre-affair relationship functioning. Target Date: 2022-11-26 Frequency: Daily Modality: individual Progress:  50%  Related Problem: Partners verbally express empathy toward each other and demonstrate shared responsibility for reconstructing the relationship. Description: Ask the partners to generate factors that they believe contributed to the affair. Target Date: 2022-11-26 Frequency: Daily Modality: individual Progress: 70%   Problem: Partners verbally express empathy toward each other and demonstrate shared responsibility for reconstructing the relationship. Description: Hurt partner uses anxiety-management techniques to deal with intrusive thoughts about the affair. Target Date: 2022-11-26 Frequency: Daily Modality: individual Progress: 50%  Client Response full compliance  Service Location Location, [address] [place], [place] [address]  Service Code cpt [phone]  Self care activities  Provide education, information  Emotion regulation skills  Validate/empathize  Identify/label emotions  Facilitate problem solving  Comments  Patient agrees to a video session. They are at home and I am in my home office.  Dx.: Depression  Meds: Xanax. Tried antidep. meds, but she has significant side-effects. Goals: They are seeking counseling to resolve significant marital conflict secondary to infidelity. Jeannetta Nap has experienced symptoms of both anxiety and depression during this period and is hoping to reduce these symptoms as the relationship issues improve. [name] is experiencing some depressive symptoms as well, along with considerable anger and frustration. Goal date 6-24   [name] and Cozette: [name] says that things between them worsened after we last met and then it "got better". Their son got arrested for embezzlement. They argued about who was to take the lead to help him out. She feels they have different perspectives on how to help their son. They argued and Xandria told [name] to deal with the situation. He did that and their discord blew over.  [name], Ph.D.  Time5:05p-6:00p 55 minutes

## 2022-07-29 ENCOUNTER — Ambulatory Visit: Payer: BC Managed Care – PPO | Admitting: Psychology

## 2022-07-29 ENCOUNTER — Other Ambulatory Visit: Payer: Self-pay

## 2022-07-31 ENCOUNTER — Other Ambulatory Visit: Payer: Self-pay

## 2022-08-01 ENCOUNTER — Other Ambulatory Visit (HOSPITAL_COMMUNITY): Payer: Self-pay

## 2022-08-04 ENCOUNTER — Other Ambulatory Visit: Payer: Self-pay | Admitting: Family Medicine

## 2022-08-05 ENCOUNTER — Other Ambulatory Visit: Payer: Self-pay | Admitting: Family Medicine

## 2022-08-05 NOTE — Telephone Encounter (Signed)
Pt LOV was on 05/22/22 Last refill was done on 07/06/22 Please advise

## 2022-08-06 ENCOUNTER — Other Ambulatory Visit: Payer: Self-pay

## 2022-08-06 MED ORDER — HYDROMORPHONE HCL 8 MG PO TABS
8.0000 mg | ORAL_TABLET | Freq: Every day | ORAL | 0 refills | Status: DC
  Filled 2022-08-06: qty 30, 30d supply, fill #0

## 2022-08-07 ENCOUNTER — Ambulatory Visit (INDEPENDENT_AMBULATORY_CARE_PROVIDER_SITE_OTHER): Payer: BC Managed Care – PPO | Admitting: Psychology

## 2022-08-07 DIAGNOSIS — F339 Major depressive disorder, recurrent, unspecified: Secondary | ICD-10-CM

## 2022-08-07 NOTE — Progress Notes (Signed)
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 (Status: maintained) -- No Description Entered  Medication Status compliance  Safety none  If Suicidal or Homicidal State Action Taken: unspecified  Current Risk: low Medications unspecified Objectives Related Problem: Partners verbally express empathy toward each other and demonstrate shared responsibility for reconstructing the relationship. Description: Clarify what level of trust exists for various aspects of the relationship. Target Date: 2022-11-26 Frequency: Daily Modality: individual Progress: 70%  Related Problem: Partners verbally express empathy toward each other and demonstrate shared responsibility for reconstructing the relationship. Description: List relationship issues that contribute to dissatisfaction and unhappiness and agree to address these in future conjoint sessions. Target Date: 2022-11-26 Frequency: Daily Modality: individual Progress: 60%  Related Problem: Partners verbally express empathy toward each other and demonstrate shared responsibility for reconstructing the relationship. Description: Identify and list behavioral changes for self and partner that would enhance the relationship. Target Date: 2022-11-26 Frequency: Daily Modality: individual Progress: 70%  Related Problem: Partners verbally express empathy toward each other and demonstrate shared responsibility for reconstructing the relationship. Description: Describe pre-affair relationship functioning. Target Date: 2022-11-26 Frequency: Daily Modality:  individual Progress: 50%  Related Problem: Partners verbally express empathy toward each other and demonstrate shared responsibility for reconstructing the relationship. Description: Ask the partners to generate factors that they believe contributed to the affair. Target Date: 2022-11-26 Frequency: Daily Modality: individual Progress: 70%   Problem: Partners verbally express empathy toward each other and demonstrate shared responsibility for reconstructing the relationship. Description: Hurt partner uses anxiety-management techniques to deal with intrusive thoughts about the affair. Target Date: 2022-11-26 Frequency: Daily Modality: individual Progress: 50%  Client Response full compliance  Service Location Location, [address] [place], [place] [address]  Service Code cpt [phone]  Self care activities  Provide education, information  Emotion regulation skills  Validate/empathize  Identify/label emotions  Facilitate problem solving  Comments  Patient agrees to a video session. They are at home and I am in my home office.  Dx.: Depression  Meds: Xanax. Tried antidep. meds, but she has significant side-effects. Goals: They are seeking counseling to resolve significant marital conflict secondary to infidelity. [name] has experienced symptoms of both anxiety and depression during this period and is hoping to reduce these symptoms as the relationship issues improve. [name] is experiencing some depressive symptoms as well, along with considerable anger and frustration. 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 He states that his daughter is now having more problems. [name] asked if "justice sensitivity" is a real issue. Told him I have not heard about it. He related to the aspect of the tendency to "make whatever is wrong, right". Talked about this being relevant  in their marital relationship. He is easily triggered when feels unjustly accused. Will research further. We talked about their alignment in dealing with their adult children. They will work to stay aligned and communicate well with each other about  their responses to their kids.                                                                                                                                            [name], Ph.D.  Time 11:40a-12:30p 50 minutes

## 2022-08-10 ENCOUNTER — Encounter (HOSPITAL_COMMUNITY): Payer: Self-pay | Admitting: Physician Assistant

## 2022-08-10 ENCOUNTER — Ambulatory Visit (HOSPITAL_COMMUNITY)
Admission: RE | Admit: 2022-08-10 | Discharge: 2022-08-10 | Disposition: A | Discharge status: Home / Self Care | Payer: BC Managed Care – PPO | Source: Ambulatory Visit | Attending: Surgery | Admitting: Surgery

## 2022-08-10 ENCOUNTER — Other Ambulatory Visit (HOSPITAL_COMMUNITY): Payer: Self-pay | Admitting: Family Medicine

## 2022-08-10 DIAGNOSIS — I739 Peripheral vascular disease, unspecified: Secondary | ICD-10-CM

## 2022-08-10 LAB — VAS US ABI WITH/WO TBI
Left ABI: 1.34
Right ABI: 1.28

## 2022-08-11 ENCOUNTER — Other Ambulatory Visit: Payer: Self-pay | Admitting: Family Medicine

## 2022-08-12 ENCOUNTER — Other Ambulatory Visit (HOSPITAL_COMMUNITY): Payer: Self-pay

## 2022-08-12 ENCOUNTER — Ambulatory Visit (INDEPENDENT_AMBULATORY_CARE_PROVIDER_SITE_OTHER): Payer: BC Managed Care – PPO | Admitting: Psychology

## 2022-08-12 DIAGNOSIS — F339 Major depressive disorder, recurrent, unspecified: Secondary | ICD-10-CM | POA: Diagnosis not present

## 2022-08-12 MED ORDER — AMPHETAMINE-DEXTROAMPHET ER 30 MG PO CP24
30.0000 mg | ORAL_CAPSULE | Freq: Every day | ORAL | 0 refills | Status: DC
  Filled 2022-08-12: qty 30, 30d supply, fill #0

## 2022-08-12 NOTE — Telephone Encounter (Signed)
Last refill-07/15/22--30 tabs, 0 refills Last VV-05/22/22  No future OV scheduled.

## 2022-08-12 NOTE — Progress Notes (Signed)
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 (Status: maintained) -- No Description Entered  Medication Status compliance  Safety none  If Suicidal or Homicidal State Action Taken: unspecified  Current Risk: low Medications unspecified Objectives Related Problem: Partners verbally express empathy toward each other and demonstrate shared responsibility for reconstructing the relationship. Description: Clarify what level of trust exists for various aspects of the relationship. Target Date: 2022-11-26 Frequency: Daily Modality: individual Progress: 70%  Related Problem: Partners verbally express empathy toward each other and demonstrate shared responsibility for reconstructing the relationship. Description: List relationship issues that contribute to dissatisfaction and unhappiness and agree to address these in future conjoint sessions. Target Date: 2022-11-26 Frequency: Daily Modality: individual Progress: 60%  Related Problem: Partners verbally express empathy toward each other and demonstrate shared responsibility for reconstructing the relationship. Description: Identify and list behavioral changes for self and partner that would enhance the relationship. Target Date: 2022-11-26 Frequency: Daily Modality: individual Progress: 70%  Related Problem: Partners verbally express empathy toward each other and demonstrate shared responsibility for reconstructing the relationship. Description: Describe pre-affair relationship functioning. Target Date: 2022-11-26 Frequency: Daily Modality: individual Progress: 50%  Related Problem: Partners verbally  express empathy toward each other and demonstrate shared responsibility for reconstructing the relationship. Description: Ask the partners to generate factors that they believe contributed to the affair. Target Date: 2022-11-26 Frequency: Daily Modality: individual Progress: 70%   Problem: Partners verbally express empathy toward each other and demonstrate shared responsibility for reconstructing the relationship. Description: Hurt partner uses anxiety-management techniques to deal with intrusive thoughts about the affair. Target Date: 2022-11-26 Frequency: Daily Modality: individual Progress: 50%  Client Response full compliance  Service Location Location, [address] [place], [place] [address]  Service Code cpt [phone]  Self care activities  Provide education, information  Emotion regulation skills  Validate/empathize  Identify/label emotions  Facilitate problem solving  Comments  Patient agrees to a video session. They are at home and I am in my home office.  Dx.: Depression  Meds: Xanax. Tried antidep. meds, but she has significant side-effects. Goals: They are seeking counseling to resolve significant marital conflict secondary to infidelity. [name] has experienced symptoms of both anxiety and depression during this period and is hoping to reduce these symptoms as the relationship issues improve. [name] is experiencing some depressive symptoms as well, along with considerable anger and frustration. Goal date 6-24   [name] and [name]: [name] and [name] had some drama with their daughter. We talked about not "taking the bait" and getting into a verbal battle. They tend to unknowingly enable "bad behavior". Discussed how to identify emotional traps. They have anger and frustration about both kids. They are, however, in sync with each other with regard to managing their interactions with the kids.  [name], Ph.D.  Time 5:15p-6:00p 45 minutes

## 2022-08-19 DIAGNOSIS — I739 Peripheral vascular disease, unspecified: Secondary | ICD-10-CM | POA: Diagnosis not present

## 2022-08-19 DIAGNOSIS — M4326 Fusion of spine, lumbar region: Secondary | ICD-10-CM | POA: Diagnosis not present

## 2022-08-21 ENCOUNTER — Ambulatory Visit (INDEPENDENT_AMBULATORY_CARE_PROVIDER_SITE_OTHER): Payer: BC Managed Care – PPO | Admitting: Psychology

## 2022-08-21 DIAGNOSIS — F339 Major depressive disorder, recurrent, unspecified: Secondary | ICD-10-CM

## 2022-08-21 DIAGNOSIS — U071 COVID-19: Secondary | ICD-10-CM | POA: Diagnosis not present

## 2022-08-21 NOTE — Progress Notes (Signed)
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 (Status: maintained) -- No Description Entered  Medication Status compliance  Safety none  If Suicidal or Homicidal State Action Taken: unspecified  Current Risk: low Medications unspecified Objectives Related Problem: Partners verbally express empathy toward each other and demonstrate shared responsibility for reconstructing the relationship. Description: Clarify what level of trust exists for various aspects of the relationship. Target Date: 2022-11-26 Frequency: Daily Modality: individual Progress: 70%  Related Problem: Partners verbally express empathy toward each other and demonstrate shared responsibility for reconstructing the relationship. Description: List relationship issues that contribute to dissatisfaction and unhappiness and agree to address these in future conjoint sessions. Target Date: 2022-11-26 Frequency: Daily Modality: individual Progress: 60%  Related Problem: Partners verbally express empathy toward each other and demonstrate shared responsibility for reconstructing the relationship. Description: Identify and list behavioral changes for self and partner that would enhance the relationship. Target Date: 2022-11-26 Frequency: Daily Modality: individual Progress: 70%  Related Problem: Partners verbally express empathy toward each other and demonstrate shared responsibility for reconstructing the relationship. Description: Describe pre-affair relationship functioning. Target Date: 2022-11-26 Frequency: Daily Modality: individual Progress:  50%  Related Problem: Partners verbally express empathy toward each other and demonstrate shared responsibility for reconstructing the relationship. Description: Ask the partners to generate factors that they believe contributed to the affair. Target Date: 2022-11-26 Frequency: Daily Modality: individual Progress: 70%   Problem: Partners verbally express empathy toward each other and demonstrate shared responsibility for reconstructing the relationship. Description: Hurt partner uses anxiety-management techniques to deal with intrusive thoughts about the affair. Target Date: 2022-11-26 Frequency: Daily Modality: individual Progress: 50%  Client Response full compliance  Service Location Location, [address] [place], [place] [address]  Service Code cpt [phone]  Self care activities  Provide education, information  Emotion regulation skills  Validate/empathize  Identify/label emotions  Facilitate problem solving  Comments  Patient agrees to a video session. They are at home and I am in my home office.  Dx.: Depression  Meds: Xanax. Tried antidep. meds, but she has significant side-effects. Goals: They are seeking counseling to resolve significant marital conflict secondary to infidelity. Jeannetta Nap has experienced symptoms of both anxiety and depression during this period and is hoping to reduce these symptoms as the relationship issues improve. [name] is experiencing some depressive symptoms as well, along with considerable anger and frustration. 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 During session, they got into argument and have difficulty breaking out of a negative cycle. She claims she tapes their conversations, but never reviews them with [name]. They have very different recollections of their interactions. She feels their relationship is void of affection verbally or physically. He  says "I'm living in an alternate universe" because they have such a different recollection of events. Stacye says she does not feel loved or that [name] has not made any progress. The only reason she says she stays with him rather than going home is the finances. He says she wants him to be someone he never was. Ended  with interpersonal conflict od despair and anger.                                                                                                                                                        [name], Ph.D.  Time 11:45a-12:30p 45 minutes

## 2022-08-25 ENCOUNTER — Telehealth: Payer: BC Managed Care – PPO | Admitting: Family Medicine

## 2022-08-26 ENCOUNTER — Encounter: Payer: Self-pay | Admitting: Family Medicine

## 2022-08-26 ENCOUNTER — Telehealth (INDEPENDENT_AMBULATORY_CARE_PROVIDER_SITE_OTHER): Payer: BC Managed Care – PPO | Admitting: Family Medicine

## 2022-08-26 ENCOUNTER — Ambulatory Visit (INDEPENDENT_AMBULATORY_CARE_PROVIDER_SITE_OTHER): Payer: BC Managed Care – PPO | Admitting: Psychology

## 2022-08-26 DIAGNOSIS — F339 Major depressive disorder, recurrent, unspecified: Secondary | ICD-10-CM | POA: Diagnosis not present

## 2022-08-26 DIAGNOSIS — M48061 Spinal stenosis, lumbar region without neurogenic claudication: Secondary | ICD-10-CM | POA: Diagnosis not present

## 2022-08-26 DIAGNOSIS — F1190 Opioid use, unspecified, uncomplicated: Secondary | ICD-10-CM

## 2022-08-26 MED ORDER — HYDROMORPHONE HCL 8 MG PO TABS: 8.0000 mg | ORAL_TABLET | Freq: Every day | ORAL | 0 refills | Status: DC

## 2022-08-26 MED ORDER — OXYCODONE HCL 15 MG PO TABS
15.0000 mg | ORAL_TABLET | ORAL | 0 refills | Status: DC | PRN
  Filled ????-??-??: fill #0

## 2022-08-26 MED ORDER — OXYCODONE HCL 15 MG PO TABS: 15.0000 mg | ORAL_TABLET | ORAL | 0 refills | Status: DC | PRN

## 2022-08-26 NOTE — Progress Notes (Signed)
Subjective:    Patient ID: [name], female    DOB: 09-23-1975, 47 y.o.   MRN: [id number]  HPI Here for pain management. Her back pain is stable, but she complains of worsening pains in her feet. She has neuropathy, and her feet will turn red at times and feel either extremely cold or extremely hot. This seems to be worsening. She asks if this could be due a complex regional pain syndrome (CRPS).  Virtual Visit via Video Note  I connected with the patient on 08/26/22 at  3:00 PM EDT by a video enabled telemedicine application and verified that I am speaking with the correct person using two identifiers.  Location patient: home Location provider:work or home office Persons participating in the virtual visit: patient, provider  I discussed the limitations of evaluation and management by telemedicine and the availability of in person appointments. The patient expressed understanding and agreed to proceed.   HPI:    ROS: See pertinent positives and negatives per HPI.  Past Medical History:  Diagnosis Date   Allergy    Anxiety    Arthritis    B12 deficiency    Chronic narcotic use    Depression    not currently    Dysrhythmia    hx tachy-was from medication nortriptylline   Esophagitis    rx   Fibromyalgia    GERD (gastroesophageal reflux disease)    History of echocardiogram    Echo 4/18: Mild concentric LVH, vigorous LVF, EF 65-70, normal wall motion, grade 1 diastolic dysfunction   History of kidney stones    Hyperlipidemia    Intervertebral disc protrusion 01/11/2014   Migraine    Obesity (BMI 35.0-39.9 without comorbidity)    Peripheral neuropathy    Polycystic ovaries    Pulmonary embolus ([place])    Spinal stenosis, lumbar    Vitamin D deficiency     Past Surgical History:  Procedure Laterality Date   60 HOUR [place] STUDY N/A 05/27/2016   Procedure: 24 HOUR [place] STUDY;  Surgeon: Manus Gunning, MD;  Location: [facility];  Service: Gastroenterology;   Laterality: N/A;   ABDOMINAL EXPOSURE N/A 05/04/2018   Procedure: ABDOMINAL EXPOSURE;  Surgeon: Rosetta Posner, MD;  Location: [facility];  Service: Vascular;  Laterality: N/A;   ANTERIOR LUMBAR FUSION N/A 05/04/2018   Procedure: Lumbar Five Sacral One Anterior lumbar interbody fusion;  Surgeon: Kary Kos, MD;  Location: [facility];  Service: Neurosurgery;  Laterality: N/A;  Lumbar Five Sacral One Anterior lumbar interbody fusion   APPENDECTOMY     BACK SURGERY  2012   left laminectomy at L3-4 per Dr. Luiz Ochoa    bladder tack  2012   CHOLECYSTECTOMY N/A 10/03/2015   Procedure: LAPAROSCOPIC CHOLECYSTECTOMY;  Surgeon: Mickeal Skinner, MD;  Location: [facility];  Service: General;  Laterality: N/A;   ESOPHAGEAL MANOMETRY N/A 05/27/2016   Procedure: ESOPHAGEAL MANOMETRY (EM);  Surgeon: Manus Gunning, MD;  Location: [facility];  Service: Gastroenterology;  Laterality: N/A;   EXCISION OF SKIN TAG  10/03/2015   Procedure: EXCISION OF SKIN TAG;  Surgeon: Arta Bruce Kinsinger, MD;  Location: [facility];  Service: General;;   LUMBAR [place]  01-31-14   per Dr. Saintclair Halsted    OOPHORECTOMY     left overy   OVARIAN CYST REMOVAL     RECTOCELE REPAIR     SINUSOTOMY  12/14/06   spinal fusion  2016/10   TONSILLECTOMY     TONSILLECTOMY  VAGINAL HYSTERECTOMY     WRIST GANGLION EXCISION Left     Family History  Problem Relation Age of Onset   Fibromyalgia Mother    Diabetes Mother    Thyroid cancer Mother    Liver disease Mother    Neuropathy Mother    Cirrhosis Mother        non-alcoholic   Pulmonary embolism Mother        both lungs   Heart failure Mother    Hypertension Father    Diabetes Father    Heart disease Father    Prostate cancer Father    Heart attack Father        x 2   Colon cancer Neg Hx    Other Neg Hx        pheochromocytoma   Colon polyps Neg Hx      Current Outpatient Medications:    ALPRAZolam (XANAX) 0.5 MG tablet, TAKE 1 TABLET BY MOUTH THREE TIMES A DAY AS NEEDED, Disp: 90  tablet, Rfl: 5   amphetamine-dextroamphetamine (ADDERALL XR) 30 MG 24 hr capsule, Take 1 capsule (30 mg total) by mouth daily., Disp: 30 capsule, Rfl: 0   atorvastatin (LIPITOR) 20 MG tablet, Take 1 tablet (20 mg total) by mouth daily., Disp: 90 tablet, Rfl: 3   B-D 3CC LUER-LOK SYR 25GX1" 25G X 1" 3 ML MISC, USE AS DIRECTED, Disp: 9 each, Rfl: 2   cetirizine (ZYRTEC) 10 MG tablet, Take 10 mg by mouth daily., Disp: , Rfl:    clobetasol (TEMOVATE) 0.05 % external solution, clobetasol 0.05 % scalp solution, Disp: , Rfl:    cyanocobalamin (VITAMIN B12) 1000 MCG/ML injection, Inject 1 mL (1,000 mcg total) into the muscle once a week., Disp: 30 mL, Rfl: 11   cyclobenzaprine (FLEXERIL) 10 MG tablet, Take 1 tablet (10 mg total) by mouth 3 (three) times daily as needed for muscle spasms., Disp: 90 tablet, Rfl: 5   esomeprazole (NEXIUM) 40 MG capsule, Take 1 capsule (40 mg total) by mouth daily., Disp: 90 capsule, Rfl: 0   famotidine (PEPCID) 40 MG tablet, Take 1 tablet (40 mg total) by mouth at bedtime., Disp: 90 tablet, Rfl: 1   fluconazole (DIFLUCAN) 150 MG tablet, TAKE 1 TABLET BY MOUTH EVERY DAY, Disp: 5 tablet, Rfl: 5   Fluocinolone Acetonide Body 0.01 % OIL, fluocinolone 0.01 % scalp oil and shower cap, Disp: , Rfl:    fluticasone (FLONASE) 50 MCG/ACT nasal spray, Place 2 sprays into both nostrils daily., Disp: 16 g, Rfl: 6   furosemide (LASIX) 20 MG tablet, Take 2 tablets by mouth twice daily as needed for fluid., Disp: 90 tablet, Rfl: 1   gabapentin (NEURONTIN) 100 MG capsule, Take 1 capsule (100 mg total) by mouth 3 (three) times daily., Disp: 270 capsule, Rfl: 1   gabapentin (NEURONTIN) 300 MG capsule, Take 1 capsule (300 mg total) by mouth 3 (three) times daily., Disp: 270 capsule, Rfl: 1   hydrochlorothiazide (HYDRODIURIL) 25 MG tablet, TAKE 1 TABLET BY MOUTH EVERY DAY, Disp: 90 tablet, Rfl: 0   HYDROmorphone (DILAUDID) 8 MG tablet, Take 1 tablet (8 mg total) by mouth at bedtime., Disp: 30  tablet, Rfl: 0   lidocaine (LIDODERM) 5 %, Place 1 patch onto the skin daily. Remove & Discard patch within 12 hours or as directed by MD, Disp: 30 patch, Rfl: 5   Multiple Vitamins-Minerals (MULTI-VITAMIN GUMMIES PO), Take 2 tablets by mouth daily., Disp: , Rfl:    oxyCODONE (ROXICODONE) 15 MG immediate release  tablet, Take 1 tablet (15 mg total) by mouth every 4 (four) hours as needed for pain., Disp: 180 tablet, Rfl: 0   potassium chloride (KLOR-CON 10) 10 MEQ tablet, Take 1 tablet (10 mEq total) by mouth daily., Disp: 90 tablet, Rfl: 3   promethazine (PHENERGAN) 25 MG tablet, Take 1 tablet (25 mg total) by mouth every 4 (four) hours as needed for nausea or vomiting., Disp: 90 tablet, Rfl: 5   Semaglutide, 2 MG/DOSE, 8 MG/3ML SOPN, Inject 2 mg as directed once a week., Disp: 3 mL, Rfl: 5   sucralfate (CARAFATE) 1 g tablet, Take 1 tablet (1 g total) by mouth 4 (four) times daily., Disp: 120 tablet, Rfl: 2   SUMAtriptan (IMITREX) 100 MG tablet, Take 1 tablet earliest onset of migraine.  May repeat once in 2 hours if headache persists or recurs., Disp: 10 tablet, Rfl: 2   Vitamin D, Ergocalciferol, (DRISDOL) 1.25 MG (50000 UNIT) CAPS capsule, Take 1 capsule by mouth every 7 days., Disp: 12 capsule, Rfl: 3  EXAM:  VITALS per patient if applicable:  GENERAL: alert, oriented, appears well and in no acute distress  HEENT: atraumatic, conjunttiva clear, no obvious abnormalities on inspection of external nose and ears  NECK: normal movements of the head and neck  LUNGS: on inspection no signs of respiratory distress, breathing rate appears normal, no obvious gross SOB, gasping or wheezing  CV: no obvious cyanosis  MS: moves all visible extremities without noticeable abnormality  PSYCH/NEURO: pleasant and cooperative, no obvious depression or anxiety, speech and thought processing grossly intact  ASSESSMENT AND PLAN: Pain management.  Indication for chronic opioid: low back pain Medication  and dose: Oxycodone 15 mg and Dilaudid 8 mg # pills per month: 180 and 30 Last UDS date: 08-04-21 Opioid Treatment Agreement signed (Y/N): 04-17-19 Opioid Treatment Agreement last reviewed with patient:  08-26-22 [facility] reviewed this encounter (include red flags): Yes We refilled her pain meds. She is due for a urine drug screen, so she will stop by the lab tomorrow to give [place] a sample. She has neuropathy in both feet. I am not sure if this would be a CRPS or not. She is already taking 1200 mg of Gabapetin a day, and she does not want to increase the dose. She has tried Lyrica and Nortriptyline in the past, but they caused side effects.  Alysia Penna, MD  Discussed the following assessment and plan:  Chronic narcotic use - Plan: DRUG MONITOR, PANEL 1, W/CONF, URINE     I discussed the assessment and treatment plan with the patient. The patient was provided an opportunity to ask questions and all were answered. The patient agreed with the plan and demonstrated an understanding of the instructions.   The patient was advised to call back or seek an in-person evaluation if the symptoms worsen or if the condition fails to improve as anticipated.     Review of Systems     Objective:   Physical Exam        Assessment & Plan:

## 2022-08-26 NOTE — Progress Notes (Signed)
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 (Status: maintained) -- No Description Entered  Medication Status compliance  Safety none  If Suicidal or Homicidal State Action Taken: unspecified  Current Risk: low Medications unspecified Objectives Related Problem: Partners verbally express empathy toward each other and demonstrate shared responsibility for reconstructing the relationship. Description: Clarify what level of trust exists for various aspects of the relationship. Target Date: 2022-11-26 Frequency: Daily Modality: individual Progress: 70%  Related Problem: Partners verbally express empathy toward each other and demonstrate shared responsibility for reconstructing the relationship. Description: List relationship issues that contribute to dissatisfaction and unhappiness and agree to address these in future conjoint sessions. Target Date: 2022-11-26 Frequency: Daily Modality: individual Progress: 60%  Related Problem: Partners verbally express empathy toward each other and demonstrate shared responsibility for reconstructing the relationship. Description: Identify and list behavioral changes for self and partner that would enhance the relationship. Target Date: 2022-11-26 Frequency: Daily Modality: individual Progress: 70%  Related Problem: Partners verbally express empathy toward each other and demonstrate shared responsibility for reconstructing the relationship. Description: Describe pre-affair relationship functioning. Target Date: 2022-11-26 Frequency: Daily Modality:  individual Progress: 50%  Related Problem: Partners verbally express empathy toward each other and demonstrate shared responsibility for reconstructing the relationship. Description: Ask the partners to generate factors that they believe contributed to the affair. Target Date: 2022-11-26 Frequency: Daily Modality: individual Progress: 70%   Problem: Partners verbally express empathy toward each other and demonstrate shared responsibility for reconstructing the relationship. Description: Hurt partner uses anxiety-management techniques to deal with intrusive thoughts about the affair. Target Date: 2022-11-26 Frequency: Daily Modality: individual Progress: 50%  Client Response full compliance  Service Location Location, [address] [place], [place] [address]  Service Code cpt [phone]  Self care activities  Provide education, information  Emotion regulation skills  Validate/empathize  Identify/label emotions  Facilitate problem solving  Comments  Patient agrees to a video session. They are at home and I am in my home office.  Dx.: Depression  Meds: Xanax. Tried antidep. meds, but she has significant side-effects. Goals: They are seeking counseling to resolve significant marital conflict secondary to infidelity. [name] has experienced symptoms of both anxiety and depression during this period and is hoping to reduce these symptoms as the relationship issues improve. [name] is experiencing some depressive symptoms as well, along with considerable anger and frustration. Goal date 6-24   [name] and [name]: [name] and [name] talked about how they interacted after the last (difficult) session. He says he tried to do something nice for her by scheduling her dental appointment and it turned into an argument. She has a severe dental phobia. He feels she was completely ungrateful for his efforts and he became hostile and verbally abusive. She says that [name] has been mean and angry at her since the  last session. I suggested that he was hurt from the last session, but she rejects that possibility. He has slept on the couch for the past 2 weeks. He says it is more comfortable because of temperature and she says  she doesn't want a relationship with someone who doesn't want to sleep with her. She rejects the notion of staying apart for the week. We talked about a tuce for the week.                                                                                                                                                          [name], Ph.D.  Time 11:45a-12:30p 45 minutes

## 2022-08-27 ENCOUNTER — Telehealth: Payer: Self-pay | Admitting: Family Medicine

## 2022-08-27 ENCOUNTER — Other Ambulatory Visit: Payer: BC Managed Care – PPO

## 2022-08-27 ENCOUNTER — Other Ambulatory Visit (HOSPITAL_COMMUNITY): Payer: Self-pay

## 2022-08-27 ENCOUNTER — Other Ambulatory Visit: Payer: Self-pay | Admitting: Family Medicine

## 2022-08-27 DIAGNOSIS — R197 Diarrhea, unspecified: Secondary | ICD-10-CM

## 2022-08-27 DIAGNOSIS — G5771 Causalgia of right lower limb: Secondary | ICD-10-CM | POA: Diagnosis not present

## 2022-08-27 DIAGNOSIS — F1190 Opioid use, unspecified, uncomplicated: Secondary | ICD-10-CM | POA: Diagnosis not present

## 2022-08-27 MED ORDER — OXYCODONE HCL 15 MG PO TABS
15.0000 mg | ORAL_TABLET | ORAL | 0 refills | Status: AC | PRN
  Filled 2022-08-27: qty 180, 30d supply, fill #0

## 2022-08-27 NOTE — Telephone Encounter (Signed)
Pt form received and placed in Dr Fry red folder 

## 2022-08-27 NOTE — Telephone Encounter (Signed)
This is what happens if I send in 3 months of refills (I explained this to her). The only refill they can see at the pharmacy is the LAST one that I sent in. So I just sent in a RX for the Oxycodone that she can get filled today. She cannot get the Hydromorphone until 09-05-22 as the pharmacy said (she picked up the last one on 08-06-22)

## 2022-08-27 NOTE — Telephone Encounter (Signed)
Request for Medical Consultation to be filled out--placed in dr's folder.  Fax to [phone] upon completion.

## 2022-08-27 NOTE — Telephone Encounter (Signed)
Patient sent my chart message on 08-27-22 with request

## 2022-08-30 LAB — DRUG MONITOR, PANEL 1, W/CONF, URINE
Alphahydroxyalprazolam: 32 ng/mL — ABNORMAL HIGH (ref <25)
Alphahydroxymidazolam: NEGATIVE ng/mL (ref <50)
Alphahydroxytriazolam: NEGATIVE ng/mL (ref <50)
Aminoclonazepam: NEGATIVE ng/mL (ref <25)
Amphetamine: 7512 ng/mL — ABNORMAL HIGH (ref <250)
Amphetamines: POSITIVE ng/mL — AB (ref <500)
Barbiturates: NEGATIVE ng/mL (ref <300)
Benzodiazepines: POSITIVE ng/mL — AB (ref <100)
Cocaine Metabolite: NEGATIVE ng/mL (ref <150)
Codeine: NEGATIVE ng/mL (ref <50)
Creatinine: 248.4 mg/dL (ref >=20.0)
Hydrocodone: NEGATIVE ng/mL (ref <50)
Hydromorphone: >10000 ng/mL — ABNORMAL HIGH (ref <50)
Hydroxyethylflurazepam: NEGATIVE ng/mL (ref <50)
Lorazepam: NEGATIVE ng/mL (ref <50)
Marijuana Metabolite: NEGATIVE ng/mL (ref <20)
Methadone Metabolite: NEGATIVE ng/mL (ref <100)
Methamphetamine: NEGATIVE ng/mL (ref <250)
Morphine: NEGATIVE ng/mL (ref <50)
Nordiazepam: NEGATIVE ng/mL (ref <50)
Norhydrocodone: NEGATIVE ng/mL (ref <50)
Noroxycodone: >10000 ng/mL — ABNORMAL HIGH (ref <50)
Opiates: POSITIVE ng/mL — AB (ref <100)
Oxazepam: NEGATIVE ng/mL (ref <50)
Oxidant: NEGATIVE ug/mL (ref <200)
Oxycodone: >10000 ng/mL — ABNORMAL HIGH (ref <50)
Oxycodone: POSITIVE ng/mL — AB (ref <100)
Oxymorphone: >10000 ng/mL — ABNORMAL HIGH (ref <50)
Phencyclidine: NEGATIVE ng/mL (ref <25)
Temazepam: NEGATIVE ng/mL (ref <50)
pH: 6.2 (ref 4.5–9.0)

## 2022-08-30 LAB — DM TEMPLATE

## 2022-08-31 ENCOUNTER — Other Ambulatory Visit (HOSPITAL_COMMUNITY): Payer: Self-pay

## 2022-08-31 MED ORDER — HYDROMORPHONE HCL 8 MG PO TABS
8.0000 mg | ORAL_TABLET | Freq: Every day | ORAL | 0 refills | Status: DC
  Filled 2022-08-31 – 2022-09-04 (×2): qty 30, 30d supply, fill #0

## 2022-09-01 NOTE — Telephone Encounter (Signed)
Pt form completed and faxed to the number provided

## 2022-09-03 ENCOUNTER — Other Ambulatory Visit: Payer: Self-pay

## 2022-09-04 ENCOUNTER — Other Ambulatory Visit: Payer: Self-pay | Admitting: Family Medicine

## 2022-09-04 ENCOUNTER — Other Ambulatory Visit: Payer: Self-pay

## 2022-09-04 ENCOUNTER — Other Ambulatory Visit (HOSPITAL_COMMUNITY): Payer: Self-pay

## 2022-09-04 ENCOUNTER — Ambulatory Visit (INDEPENDENT_AMBULATORY_CARE_PROVIDER_SITE_OTHER): Payer: BC Managed Care – PPO | Admitting: Psychology

## 2022-09-04 DIAGNOSIS — F339 Major depressive disorder, recurrent, unspecified: Secondary | ICD-10-CM | POA: Diagnosis not present

## 2022-09-04 MED ORDER — CETIRIZINE HCL 10 MG PO TABS
10.0000 mg | ORAL_TABLET | Freq: Every day | ORAL | 3 refills | Status: DC
  Filled 2022-09-04: qty 83, 83d supply, fill #0
  Filled 2023-07-22: qty 83, 83d supply, fill #1

## 2022-09-04 NOTE — Telephone Encounter (Signed)
Last refill-03/09/2017*

## 2022-09-04 NOTE — Progress Notes (Signed)
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 (Status: maintained) -- No Description Entered  Medication Status compliance  Safety none  If Suicidal or Homicidal State Action Taken: unspecified  Current Risk: low Medications unspecified Objectives Related Problem: Partners verbally express empathy toward each other and demonstrate shared responsibility for reconstructing the relationship. Description: Clarify what level of trust exists for various aspects of the relationship. Target Date: 2022-11-26 Frequency: Daily Modality: individual Progress: 70%  Related Problem: Partners verbally express empathy toward each other and demonstrate shared responsibility for reconstructing the relationship. Description: List relationship issues that contribute to dissatisfaction and unhappiness and agree to address these in future conjoint sessions. Target Date: 2022-11-26 Frequency: Daily Modality: individual Progress: 60%  Related Problem: Partners verbally express empathy toward each other and demonstrate shared responsibility for reconstructing the relationship. Description: Identify and list behavioral changes for self and partner that would enhance the relationship. Target Date: 2022-11-26 Frequency: Daily Modality: individual Progress: 70%  Related Problem: Partners verbally express empathy toward each other and demonstrate shared responsibility for reconstructing the relationship. Description: Describe pre-affair relationship functioning. Target Date:  2022-11-26 Frequency: Daily Modality: individual Progress: 50%  Related Problem: Partners verbally express empathy toward each other and demonstrate shared responsibility for reconstructing the relationship. Description: Ask the partners to generate factors that they believe contributed to the affair. Target Date: 2022-11-26 Frequency: Daily Modality: individual Progress: 70%   Problem: Partners verbally express empathy toward each other and demonstrate shared responsibility for reconstructing the relationship. Description: Hurt partner uses anxiety-management techniques to deal with intrusive thoughts about the affair. Target Date: 2022-11-26 Frequency: Daily Modality: individual Progress: 50%  Client Response full compliance  Service Location Location, [address] [place], [place] [address]  Service Code cpt [phone]  Self care activities  Provide education, information  Emotion regulation skills  Validate/empathize  Identify/label emotions  Facilitate problem solving  Comments  Patient agrees to a video session. They are at home and I am in my home office.  Dx.: Depression  Meds: Xanax. Tried antidep. meds, but she has significant side-effects. Goals: They are seeking counseling to resolve significant marital conflict secondary to infidelity. [name] has experienced symptoms of both anxiety and depression during this period and is hoping to reduce these symptoms as the relationship issues improve. [name] is experiencing some depressive symptoms as well, along with considerable anger and frustration. Goal date 6-24   [name]: [name] has a class today. She talked about her extreme dental anxiety in addition to the significant debt they have (partly due to her dental expenses). She thinks that [name] resents her for not working and contributing to the family finances. She says that their arguments are generally triggered by small issues. I am going to request to speak with [name]'s  therapist to better understand why he cannot or will not share loving feelings about [name].  [name],[name], Ph.D.  Time 11:45a-12:30p 45 minutes

## 2022-09-05 ENCOUNTER — Telehealth: Payer: BC Managed Care – PPO | Admitting: Nurse Practitioner

## 2022-09-05 DIAGNOSIS — R399 Unspecified symptoms and signs involving the genitourinary system: Secondary | ICD-10-CM | POA: Diagnosis not present

## 2022-09-05 MED ORDER — PHENAZOPYRIDINE HCL 100 MG PO TABS: 100.0000 mg | ORAL_TABLET | Freq: Three times a day (TID) | ORAL | 0 refills | Status: AC | PRN

## 2022-09-05 MED ORDER — NITROFURANTOIN MONOHYD MACRO 100 MG PO CAPS: 100.0000 mg | ORAL_CAPSULE | Freq: Two times a day (BID) | ORAL | 0 refills | Status: AC

## 2022-09-05 NOTE — Progress Notes (Signed)

## 2022-09-05 NOTE — Progress Notes (Signed)
I have spent 5 minutes in review of e-visit questionnaire, review and updating patient chart, medical decision making and response to patient.  ° °Eliud [place] W Ruthmary Occhipinti, NP ° °  °

## 2022-09-07 ENCOUNTER — Other Ambulatory Visit: Payer: Self-pay

## 2022-09-08 NOTE — Progress Notes (Signed)
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 (Status: maintained) -- No Description Entered  Medication Status compliance  Safety none  If Suicidal or Homicidal State Action Taken: unspecified  Current Risk: low Medications unspecified Objectives Related Problem: Partners verbally express empathy toward each other and demonstrate shared responsibility for reconstructing the relationship. Description: Clarify what level of trust exists for various aspects of the relationship. Target Date: 2022-11-26 Frequency: Daily Modality: individual Progress: 70%  Related Problem: Partners verbally express empathy toward each other and demonstrate shared responsibility for reconstructing the relationship. Description: List relationship issues that contribute to dissatisfaction and unhappiness and agree to address these in future conjoint sessions. Target Date: 2022-11-26 Frequency: Daily Modality: individual Progress: 60%  Related Problem: Partners verbally express empathy toward each other and demonstrate shared responsibility for reconstructing the relationship. Description: Identify and list behavioral changes for self and partner that would enhance the relationship. Target Date: 2022-11-26 Frequency: Daily Modality: individual Progress: 70%  Related Problem: Partners verbally express empathy toward each other and demonstrate shared responsibility for reconstructing the relationship. Description: Describe pre-affair relationship  functioning. Target Date: 2022-11-26 Frequency: Daily Modality: individual Progress: 50%  Related Problem: Partners verbally express empathy toward each other and demonstrate shared responsibility for reconstructing the relationship. Description: Ask the partners to generate factors that they believe contributed to the affair. Target Date: 2022-11-26 Frequency: Daily Modality: individual Progress: 70%   Problem: Partners verbally express empathy toward each other and demonstrate shared responsibility for reconstructing the relationship. Description: Hurt partner uses anxiety-management techniques to deal with intrusive thoughts about the affair. Target Date: 2022-11-26 Frequency: Daily Modality: individual Progress: 50%  Client Response full compliance  Service Location Location, [address] [place], [place] [address]  Service Code cpt [phone]  Self care activities  Provide education, information  Emotion regulation skills  Validate/empathize  Identify/label emotions  Facilitate problem solving  Comments  Patient agrees to a video session. They are at home and I am in my home office.  Dx.: Depression  Meds: Xanax. Tried antidep. meds, but she has significant side-effects. Goals: They are seeking counseling to resolve significant marital conflict secondary to infidelity. [name] has experienced symptoms of both anxiety and depression during this period and is hoping to reduce these symptoms as the relationship issues improve. [name] is experiencing some depressive symptoms as well, along with considerable anger and frustration. Goal date 6-24   [name]: She contacted me after last session to say that she suspects that [name] has been communicating with the woman with whom he had his affair. She is distraught. She confronted him and he was enraged. Says he has been calling her terrible names. Today's session, she reports feeling physically terrible. She has a UTI and is on medication.  [name] logged on and states that he is not seeing this woman and is angry that she "illegally taped him". He is emphatic  that he is not seeing this woman and that he has answers for all of the accusations [name] has made. She is focused on how poorly he is treating her rather than what her next move  Is going to be. He says that she is now reading messages he wrote 2 years ago to his girlfriend. He is enraged with her and she claims she wants to know how to leave. Told her that she needs to make arrangements to leave and get home. He keeps stating that she is threatening court and it continually gets brought up. She will text me when she gets back home. Told her not to leave until she is feeling physically better.                                                                                                                                                                  [name], Ph.D.  Time 3:10p-4:0p 50 minutes

## 2022-09-09 ENCOUNTER — Ambulatory Visit (INDEPENDENT_AMBULATORY_CARE_PROVIDER_SITE_OTHER): Payer: BC Managed Care – PPO | Admitting: Psychology

## 2022-09-09 DIAGNOSIS — F339 Major depressive disorder, recurrent, unspecified: Secondary | ICD-10-CM | POA: Diagnosis not present

## 2022-09-12 ENCOUNTER — Other Ambulatory Visit: Payer: Self-pay | Admitting: Family Medicine

## 2022-09-14 ENCOUNTER — Other Ambulatory Visit (HOSPITAL_COMMUNITY): Payer: Self-pay

## 2022-09-14 ENCOUNTER — Other Ambulatory Visit: Payer: Self-pay

## 2022-09-14 MED ORDER — AMPHETAMINE-DEXTROAMPHET ER 30 MG PO CP24
30.0000 mg | ORAL_CAPSULE | Freq: Every day | ORAL | 0 refills | Status: DC
  Filled 2022-09-14: qty 30, 30d supply, fill #0

## 2022-09-14 NOTE — Telephone Encounter (Signed)
Pt last  PMV visit was done on 08/26/22 Last refill was done on 08/12/2022 Please advise

## 2022-09-15 ENCOUNTER — Other Ambulatory Visit: Payer: Self-pay

## 2022-09-15 ENCOUNTER — Ambulatory Visit (INDEPENDENT_AMBULATORY_CARE_PROVIDER_SITE_OTHER): Payer: BC Managed Care – PPO | Admitting: Psychology

## 2022-09-15 DIAGNOSIS — F339 Major depressive disorder, recurrent, unspecified: Secondary | ICD-10-CM | POA: Diagnosis not present
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
## 2022-09-17 ENCOUNTER — Other Ambulatory Visit (HOSPITAL_COMMUNITY): Payer: Self-pay

## 2022-09-18 ENCOUNTER — Ambulatory Visit (INDEPENDENT_AMBULATORY_CARE_PROVIDER_SITE_OTHER): Payer: BC Managed Care – PPO | Admitting: Psychology

## 2022-09-18 DIAGNOSIS — F339 Major depressive disorder, recurrent, unspecified: Secondary | ICD-10-CM | POA: Diagnosis not present
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
## 2022-09-19 ENCOUNTER — Other Ambulatory Visit (HOSPITAL_COMMUNITY): Payer: Self-pay

## 2022-09-21 ENCOUNTER — Telehealth: Payer: BC Managed Care – PPO | Admitting: Nurse Practitioner

## 2022-09-21 DIAGNOSIS — R300 Dysuria: Secondary | ICD-10-CM

## 2022-09-21 NOTE — Progress Notes (Signed)
[name],   I am sorry to hear you are not completely better. Unfortunately when we have patients that have not improved it is our protocol to refer you for an in person evaluation so you can have urine testing performed.   I would suggest calling your primary care doctor in the morning. If they cannot see you tomorrow you may need to visit a local Urgent Care.   I feel your condition warrants further evaluation and I recommend that you be seen for a face to face visit.  Please contact your primary care physician practice to be seen. Many offices offer virtual options to be seen via video if you are not comfortable going in person to a medical facility at this time.  NOTE: You will NOT be charged for this eVisit.  If you do not have a PCP, [facility] offers a free physician referral service available at [phone]. Our trained staff has the experience, knowledge and resources to put you in touch with a physician who is right for you.    If you are having a true medical emergency please call 911.   Your e-visit answers were reviewed by a board certified advanced clinical practitioner to complete your personal care plan.  Thank you for using e-Visits.

## 2022-09-22 ENCOUNTER — Encounter: Payer: Self-pay | Admitting: Family Medicine

## 2022-09-22 ENCOUNTER — Telehealth: Payer: Self-pay | Admitting: Family Medicine

## 2022-09-22 ENCOUNTER — Telehealth (INDEPENDENT_AMBULATORY_CARE_PROVIDER_SITE_OTHER): Payer: BC Managed Care – PPO | Admitting: Family Medicine

## 2022-09-22 DIAGNOSIS — N390 Urinary tract infection, site not specified: Secondary | ICD-10-CM | POA: Diagnosis not present

## 2022-09-22 MED ORDER — CIPROFLOXACIN HCL 500 MG PO TABS: 500.0000 mg | ORAL_TABLET | Freq: Two times a day (BID) | ORAL | 0 refills | Status: DC

## 2022-09-22 NOTE — Telephone Encounter (Addendum)
Pt has a UTI. Pt had an e-visit with another provider yesterday. Pt was advised to see PCP today and have a urine culture or go to UC. Pt stated she is already taking Azo and antibiotics have not really worked for her in the past. Pt was offered an OV and stated she does not have transportation. Pt cannot come in today, but wanted to ask MD for a stronger antibiotic to treat UTI.  Pt was then offered an OV for tomorrow.  Pt declined that visit as well, stating family member is having surgery.  Please advise.  LOV:  11/03/2021

## 2022-09-22 NOTE — Progress Notes (Signed)
Subjective:    Patient ID: [name], female    DOB: [birth year], 47 y.o.   MRN: [id number]  HPI Virtual Visit via Video Note  I connected with the patient on 09/22/22 at  3:15 PM EDT by a video enabled telemedicine application and verified that I am speaking with the correct person using two identifiers.  Location patient: home Location provider:work or home office Persons participating in the virtual visit: patient, provider  I discussed the limitations of evaluation and management by telemedicine and the availability of in person appointments. The patient expressed understanding and agreed to proceed.   HPI: Here for a UTI. She had an E visit on 09-05-22 for several days of urgency and burning on urination. No fever. She was given 5 days of Macrobid. This has not helped so her symptoms persist. She is using Azo for relief.    ROS: See pertinent positives and negatives per HPI.  Past Medical History:  Diagnosis Date   Allergy    Anxiety    Arthritis    B12 deficiency    Chronic narcotic use    Depression    not currently    Dysrhythmia    hx tachy-was from medication nortriptylline   Esophagitis    rx   Fibromyalgia    GERD (gastroesophageal reflux disease)    History of echocardiogram    Echo 4/18: Mild concentric LVH, vigorous LVF, EF 65-70, normal wall motion, grade 1 diastolic dysfunction   History of kidney stones    Hyperlipidemia    Intervertebral disc protrusion 01/11/2014   Migraine    Obesity (BMI 35.0-39.9 without comorbidity)    Peripheral neuropathy    Polycystic ovaries    Pulmonary embolus    Spinal stenosis, lumbar    Vitamin D deficiency     Past Surgical History:  Procedure Laterality Date   50 HOUR PH STUDY N/A 05/27/2016   Procedure: 24 HOUR PH STUDY;  Surgeon: Ruffin Frederick, MD;  Location: [facility];  Service: Gastroenterology;  Laterality: N/A;   ABDOMINAL EXPOSURE N/A 05/04/2018   Procedure: ABDOMINAL EXPOSURE;  Surgeon:  Larina Earthly, MD;  Location: [facility] OR;  Service: Vascular;  Laterality: N/A;   ANTERIOR LUMBAR FUSION N/A 05/04/2018   Procedure: Lumbar Five Sacral One Anterior lumbar interbody fusion;  Surgeon: Donalee Citrin, MD;  Location: [facility] OR;  Service: Neurosurgery;  Laterality: N/A;  Lumbar Five Sacral One Anterior lumbar interbody fusion   APPENDECTOMY     BACK SURGERY  2012   left laminectomy at L3-4 per Dr. Phoebe Perch    bladder tack  2012   CHOLECYSTECTOMY N/A 10/03/2015   Procedure: LAPAROSCOPIC CHOLECYSTECTOMY;  Surgeon: Rodman Pickle, MD;  Location: [facility] OR;  Service: General;  Laterality: N/A;   ESOPHAGEAL MANOMETRY N/A 05/27/2016   Procedure: ESOPHAGEAL MANOMETRY (EM);  Surgeon: Ruffin Frederick, MD;  Location: [facility];  Service: Gastroenterology;  Laterality: N/A;   EXCISION OF SKIN TAG  10/03/2015   Procedure: EXCISION OF SKIN TAG;  Surgeon: De Blanch Kinsinger, MD;  Location: [facility] OR;  Service: General;;   LUMBAR DISC SURGERY  01-31-14   per Dr. [name]    OOPHORECTOMY     left overy   OVARIAN CYST REMOVAL     RECTOCELE REPAIR     SINUSOTOMY  12/14/06   spinal fusion  2016/10   TONSILLECTOMY     TONSILLECTOMY     VAGINAL HYSTERECTOMY     WRIST GANGLION EXCISION Left  Family History  Problem Relation Age of Onset   Fibromyalgia Mother    Diabetes Mother    Thyroid cancer Mother    Liver disease Mother    Neuropathy Mother    Cirrhosis Mother        non-alcoholic   Pulmonary embolism Mother        both lungs   Heart failure Mother    Hypertension Father    Diabetes Father    Heart disease Father    Prostate cancer Father    Heart attack Father        x 2   Colon cancer Neg Hx    Other Neg Hx        pheochromocytoma   Colon polyps Neg Hx      Current Outpatient Medications:    ALPRAZolam (XANAX) 0.5 MG tablet, TAKE 1 TABLET BY MOUTH THREE TIMES A DAY AS NEEDED, Disp: 90 tablet, Rfl: 5   amphetamine-dextroamphetamine (ADDERALL XR) 30 MG 24 hr capsule, Take 1  capsule (30 mg total) by mouth daily., Disp: 30 capsule, Rfl: 0   atorvastatin (LIPITOR) 20 MG tablet, Take 1 tablet (20 mg total) by mouth daily., Disp: 90 tablet, Rfl: 3   B-D 3CC LUER-LOK SYR 25GX1" 25G X 1" 3 ML MISC, USE AS DIRECTED, Disp: 9 each, Rfl: 2   cetirizine (ZYRTEC) 10 MG tablet, Take 1 tablet (10 mg total) by mouth daily., Disp: 90 tablet, Rfl: 3   clobetasol (TEMOVATE) 0.05 % external solution, clobetasol 0.05 % scalp solution, Disp: , Rfl:    cyanocobalamin (VITAMIN B12) 1000 MCG/ML injection, Inject 1 mL (1,000 mcg total) into the muscle once a week., Disp: 30 mL, Rfl: 11   cyclobenzaprine (FLEXERIL) 10 MG tablet, Take 1 tablet (10 mg total) by mouth 3 (three) times daily as needed for muscle spasms., Disp: 90 tablet, Rfl: 5   esomeprazole (NEXIUM) 40 MG capsule, Take 1 capsule (40 mg total) by mouth daily., Disp: 90 capsule, Rfl: 0   famotidine (PEPCID) 40 MG tablet, Take 1 tablet (40 mg total) by mouth at bedtime., Disp: 90 tablet, Rfl: 1   fluconazole (DIFLUCAN) 150 MG tablet, TAKE 1 TABLET BY MOUTH EVERY DAY, Disp: 5 tablet, Rfl: 5   Fluocinolone Acetonide Body 0.01 % OIL, fluocinolone 0.01 % scalp oil and shower cap, Disp: , Rfl:    fluticasone (FLONASE) 50 MCG/ACT nasal spray, Place 2 sprays into both nostrils daily., Disp: 16 g, Rfl: 6   furosemide (LASIX) 20 MG tablet, Take 2 tablets by mouth twice daily as needed for fluid., Disp: 90 tablet, Rfl: 1   gabapentin (NEURONTIN) 100 MG capsule, Take 1 capsule (100 mg total) by mouth 3 (three) times daily., Disp: 270 capsule, Rfl: 1   gabapentin (NEURONTIN) 300 MG capsule, Take 1 capsule (300 mg total) by mouth 3 (three) times daily., Disp: 270 capsule, Rfl: 1   hydrochlorothiazide (HYDRODIURIL) 25 MG tablet, TAKE 1 TABLET BY MOUTH EVERY DAY, Disp: 90 tablet, Rfl: 0   HYDROmorphone (DILAUDID) 8 MG tablet, Take 1 tablet (8 mg total) by mouth at bedtime., Disp: 30 tablet, Rfl: 0   lidocaine (LIDODERM) 5 %, Place 1 patch onto the  skin daily. Remove & Discard patch within 12 hours or as directed by MD, Disp: 30 patch, Rfl: 5   Multiple Vitamins-Minerals (MULTI-VITAMIN GUMMIES PO), Take 2 tablets by mouth daily., Disp: , Rfl:    oxyCODONE (ROXICODONE) 15 MG immediate release tablet, Take 1 tablet (15 mg total) by mouth every  4 (four) hours as needed for pain., Disp: 180 tablet, Rfl: 0   phenazopyridine (PYRIDIUM) 100 MG tablet, Take 1 tablet (100 mg total) by mouth 3 (three) times daily as needed for pain., Disp: 10 tablet, Rfl: 0   potassium chloride (KLOR-CON 10) 10 MEQ tablet, Take 1 tablet (10 mEq total) by mouth daily., Disp: 90 tablet, Rfl: 3   promethazine (PHENERGAN) 25 MG tablet, Take 1 tablet (25 mg total) by mouth every 4 (four) hours as needed for nausea or vomiting., Disp: 90 tablet, Rfl: 5   Semaglutide, 2 MG/DOSE, 8 MG/3ML SOPN, Inject 2 mg as directed once a week., Disp: 3 mL, Rfl: 5   sucralfate (CARAFATE) 1 g tablet, Take 1 tablet (1 g total) by mouth 4 (four) times daily., Disp: 120 tablet, Rfl: 2   SUMAtriptan (IMITREX) 100 MG tablet, Take 1 tablet earliest onset of migraine.  May repeat once in 2 hours if headache persists or recurs., Disp: 10 tablet, Rfl: 2   Vitamin D, Ergocalciferol, (DRISDOL) 1.25 MG (50000 UNIT) CAPS capsule, Take 1 capsule by mouth every 7 days., Disp: 12 capsule, Rfl: 3  EXAM:  VITALS per patient if applicable:  GENERAL: alert, oriented, appears well and in no acute distress  HEENT: atraumatic, conjunttiva clear, no obvious abnormalities on inspection of external nose and ears  NECK: normal movements of the head and neck  LUNGS: on inspection no signs of respiratory distress, breathing rate appears normal, no obvious gross SOB, gasping or wheezing  CV: no obvious cyanosis  MS: moves all visible extremities without noticeable abnormality  PSYCH/NEURO: pleasant and cooperative, no obvious depression or anxiety, speech and thought processing grossly intact  ASSESSMENT AND  PLAN: UTI, treat with 7 days of Cipro. Drink lots of water.  Gershon Crane, MD  Discussed the following assessment and plan:  No diagnosis found.     I discussed the assessment and treatment plan with the patient. The patient was provided an opportunity to ask questions and all were answered. The patient agreed with the plan and demonstrated an understanding of the instructions.   The patient was advised to call back or seek an in-person evaluation if the symptoms worsen or if the condition fails to improve as anticipated.      Review of Systems     Objective:   Physical Exam        Assessment & Plan:

## 2022-09-23 ENCOUNTER — Ambulatory Visit: Payer: BC Managed Care – PPO | Admitting: Psychology

## 2022-09-23 NOTE — Telephone Encounter (Signed)
Pt had a virtual visit with Dr Clent Ridges on 09/22/22

## 2022-09-24 ENCOUNTER — Ambulatory Visit (INDEPENDENT_AMBULATORY_CARE_PROVIDER_SITE_OTHER): Payer: BC Managed Care – PPO | Admitting: Psychology

## 2022-09-24 DIAGNOSIS — F339 Major depressive disorder, recurrent, unspecified: Secondary | ICD-10-CM

## 2022-09-24 NOTE — Progress Notes (Signed)
Date: 09/24/2022  Treatment Plan Diagnosis 296.33 (Major depressive affective disorder, recurrent episode, severe, without mention of psychotic behavior) [n/a]  F43.22 (Adjustment Disorder, With anxiety) [n/a]  Symptoms One partner engages in sexual behavior (e.g., penile-vaginal intercourse, oral sex, anal sex) that violates the explicit or implicit expectations of the relationship. (Status: maintained) -- No Description Entered  Medication Status compliance  Safety none  If Suicidal or Homicidal State Action Taken: unspecified  Current Risk: low Medications unspecified Objectives Related Problem: Partners verbally express empathy toward each other and demonstrate shared responsibility for reconstructing the relationship. Description: Clarify what level of trust exists for various aspects of the relationship. Target Date: 2022-11-26 Frequency: Daily Modality: individual Progress: 70%  Related Problem: Partners verbally express empathy toward each other and demonstrate shared responsibility for reconstructing the relationship. Description: List relationship issues that contribute to dissatisfaction and unhappiness and agree to address these in future conjoint sessions. Target Date: 2022-11-26 Frequency: Daily Modality: individual Progress: 60%  Related Problem: Partners verbally express empathy toward each other and demonstrate shared responsibility for reconstructing the relationship. Description: Identify and list behavioral changes for self and partner that would enhance the relationship. Target Date: 2022-11-26 Frequency: Daily Modality: individual Progress: 70%  Related Problem: Partners verbally express empathy toward each other and demonstrate shared responsibility for  reconstructing the relationship. Description: Describe pre-affair relationship functioning. Target Date: 2022-11-26 Frequency: Daily Modality: individual Progress: 50%  Related Problem: Partners verbally express empathy toward each other and demonstrate shared responsibility for reconstructing the relationship. Description: Ask the partners to generate factors that they believe contributed to the affair. Target Date: 2022-11-26 Frequency: Daily Modality: individual Progress: 70%   Problem: Partners verbally express empathy toward each other and demonstrate shared responsibility for reconstructing the relationship. Description: Hurt partner uses anxiety-management techniques to deal with intrusive thoughts about the affair. Target Date: 2022-11-26 Frequency: Daily Modality: individual Progress: 50%  Client Response full compliance  Service Location Location, [address] [place], [place] [address]  Service Code cpt [phone]  Self care activities  Provide education, information  Emotion regulation skills  Validate/empathize  Identify/label emotions  Facilitate problem solving  Comments  Patient agrees to a video session. They are at home and I am in my home office.  Dx.: Depression  Meds: Xanax. Tried antidep. meds, but she has significant side-effects. Goals: They are seeking counseling to resolve significant marital conflict secondary to infidelity. [name] has experienced symptoms of both anxiety and depression during this period and is hoping to reduce these symptoms as the relationship issues improve. [name] is experiencing some depressive symptoms as well, along with considerable anger and frustration. Goal date 6-24   Panda and [name]: [name] was to have foot surgery today, but he cancelled it because of problems at work and his concern about losing his job. He wants more info. before scheduling.  [name] shares he is tired  of being called a liar. Both admit when they get mad they  say things to just be hurtful. We discussed whether it is emotionally and physically safe for them to be together. They were together last weekend and "acted normal" but didn't discuss anything. Told them we need to make some decisions about whether to move forward. I consulted with Lennice Sites ([name]'s therapist) to gain a better understanding of [name]'s perspective.                                                                                                                                                                                [name], Ph.D.  Time 3:10p-4:0p 50 minutes

## 2022-09-25 ENCOUNTER — Other Ambulatory Visit: Payer: Self-pay

## 2022-09-29 ENCOUNTER — Encounter: Payer: Self-pay | Admitting: Family Medicine

## 2022-09-30 ENCOUNTER — Encounter: Payer: Self-pay | Admitting: Family Medicine

## 2022-10-01 ENCOUNTER — Other Ambulatory Visit (HOSPITAL_COMMUNITY): Payer: Self-pay

## 2022-10-01 ENCOUNTER — Other Ambulatory Visit: Payer: Self-pay

## 2022-10-01 MED ORDER — OXYCODONE HCL 15 MG PO TABS
15.0000 mg | ORAL_TABLET | ORAL | 0 refills | Status: DC | PRN
  Filled 2022-10-01: qty 180, 30d supply, fill #0

## 2022-10-01 MED ORDER — HYDROMORPHONE HCL 8 MG PO TABS
8.0000 mg | ORAL_TABLET | Freq: Every day | ORAL | 0 refills | Status: DC
  Filled 2022-10-07: qty 30, 30d supply, fill #0

## 2022-10-01 NOTE — Telephone Encounter (Signed)
Done

## 2022-10-01 NOTE — Telephone Encounter (Signed)
This has already been done.

## 2022-10-02 ENCOUNTER — Ambulatory Visit (INDEPENDENT_AMBULATORY_CARE_PROVIDER_SITE_OTHER): Payer: BC Managed Care – PPO | Admitting: Psychology

## 2022-10-02 DIAGNOSIS — F339 Major depressive disorder, recurrent, unspecified: Secondary | ICD-10-CM | POA: Diagnosis not present

## 2022-10-02 NOTE — Progress Notes (Signed)
Date: 10/02/2022  Treatment Plan Diagnosis 296.33 (Major depressive affective disorder, recurrent episode, severe, without mention of psychotic behavior) [n/a]  F43.22 (Adjustment Disorder, With anxiety) [n/a]  Symptoms One partner engages in sexual behavior (e.g., penile-vaginal intercourse, oral sex, anal sex) that violates the explicit or implicit expectations of the relationship. (Status: maintained) -- No Description Entered  Medication Status compliance  Safety none  If Suicidal or Homicidal State Action Taken: unspecified  Current Risk: low Medications unspecified Objectives Related Problem: Partners verbally express empathy toward each other and demonstrate shared responsibility for reconstructing the relationship. Description: Clarify what level of trust exists for various aspects of the relationship. Target Date: 2022-11-26 Frequency: Daily Modality: individual Progress: 70%  Related Problem: Partners verbally express empathy toward each other and demonstrate shared responsibility for reconstructing the relationship. Description: List relationship issues that contribute to dissatisfaction and unhappiness and agree to address these in future conjoint sessions. Target Date: 2022-11-26 Frequency: Daily Modality: individual Progress: 60%  Related Problem: Partners verbally express empathy toward each other and demonstrate shared responsibility for reconstructing the relationship. Description: Identify and list behavioral changes for self and partner that would enhance the relationship. Target Date: 2022-11-26 Frequency: Daily Modality: individual Progress: 70%  Related Problem: Partners verbally express empathy toward each other and  demonstrate shared responsibility for reconstructing the relationship. Description: Describe pre-affair relationship functioning. Target Date: 2022-11-26 Frequency: Daily Modality: individual Progress: 50%  Related Problem: Partners verbally express empathy toward each other and demonstrate shared responsibility for reconstructing the relationship. Description: Ask the partners to generate factors that they believe contributed to the affair. Target Date: 2022-11-26 Frequency: Daily Modality: individual Progress: 70%   Problem: Partners verbally express empathy toward each other and demonstrate shared responsibility for reconstructing the relationship. Description: Hurt partner uses anxiety-management techniques to deal with intrusive thoughts about the affair. Target Date: 2022-11-26 Frequency: Daily Modality: individual Progress: 50%  Client Response full compliance  Service Location Location, [address] [name], [name] [address]  Service Code cpt [phone]  Self care activities  Provide education, information  Emotion regulation skills  Validate/empathize  Identify/label emotions  Facilitate problem solving  Comments  Patient agrees to a video session. They are at home and I am in my home office.  Dx.: Depression  Meds: Xanax. Tried antidep. meds, but she has significant side-effects. Goals: They are seeking counseling to resolve significant marital conflict secondary to infidelity. [name] has experienced symptoms of both anxiety and depression during this period and is hoping to reduce these symptoms as the relationship issues improve. [name] is experiencing some depressive symptoms as well, along with considerable anger and frustration. Goal date 6-24   [name] and [name]: [name] is back at the hotel because she had some dental surgery and needed to be monitored. She has been  in terrible pain. Says she and [name] were getting along well until yesterday. They have multiple  problems right now with both of their children, that acts as a distraction from their marital problems. Asked about the status of the relationship today. Difficult for them to address the fundamental distrust in the relationship. Confronted them whether they can stay married, but both do not seem willing to say the relationship cannot succeed under these circumstances. Told them to consider realistic alternatives. They are at an impasse and unable to make a decision about status of relationship. Shared my concerns of them staying together.                                                                                                                                                                                     [name], Ph.D.  Time 11:40a-12:30p 50 minutes

## 2022-10-05 ENCOUNTER — Other Ambulatory Visit: Payer: Self-pay

## 2022-10-05 ENCOUNTER — Other Ambulatory Visit: Payer: Self-pay | Admitting: Family Medicine

## 2022-10-05 MED ORDER — OZEMPIC (2 MG/DOSE) 8 MG/3ML ~~LOC~~ SOPN
2.0000 mg | PEN_INJECTOR | SUBCUTANEOUS | 5 refills | Status: DC
  Filled 2022-10-05 – 2022-10-28 (×3): qty 3, 28d supply, fill #0

## 2022-10-07 ENCOUNTER — Other Ambulatory Visit (HOSPITAL_COMMUNITY): Payer: Self-pay

## 2022-10-07 ENCOUNTER — Other Ambulatory Visit: Payer: Self-pay

## 2022-10-07 ENCOUNTER — Ambulatory Visit (INDEPENDENT_AMBULATORY_CARE_PROVIDER_SITE_OTHER): Payer: BC Managed Care – PPO | Admitting: Psychology

## 2022-10-07 DIAGNOSIS — F339 Major depressive disorder, recurrent, unspecified: Secondary | ICD-10-CM | POA: Diagnosis not present

## 2022-10-07 NOTE — Progress Notes (Signed)
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 (Status: maintained) -- No Description Entered  Medication Status compliance  Safety none  If Suicidal or Homicidal State Action Taken: unspecified  Current Risk: low Medications unspecified Objectives Related Problem: Partners verbally express empathy toward each other and demonstrate shared responsibility for reconstructing the relationship. Description: Clarify what level of trust exists for various aspects of the relationship. Target Date: 2022-11-26 Frequency: Daily Modality: individual Progress: 70%  Related Problem: Partners verbally express empathy toward each other and demonstrate shared responsibility for reconstructing the relationship. Description: List relationship issues that contribute to dissatisfaction and unhappiness and agree to address these in future conjoint sessions. Target Date: 2022-11-26 Frequency: Daily Modality: individual Progress: 60%  Related Problem: Partners verbally express empathy toward each other and demonstrate shared responsibility for reconstructing the relationship. Description: Identify and list behavioral changes for self and partner that would enhance the relationship. Target Date: 2022-11-26 Frequency: Daily Modality: individual Progress: 70%  Related Problem: Partners verbally express empathy toward each other and demonstrate shared responsibility for reconstructing the relationship. Description: Describe pre-affair relationship functioning. Target Date: 2022-11-26 Frequency: Daily Modality: individual Progress: 50%  Related Problem: Partners verbally  express empathy toward each other and demonstrate shared responsibility for reconstructing the relationship. Description: Ask the partners to generate factors that they believe contributed to the affair. Target Date: 2022-11-26 Frequency: Daily Modality: individual Progress: 70%   Problem: Partners verbally express empathy toward each other and demonstrate shared responsibility for reconstructing the relationship. Description: Hurt partner uses anxiety-management techniques to deal with intrusive thoughts about the affair. Target Date: 2022-11-26 Frequency: Daily Modality: individual Progress: 50%  Client Response full compliance  Service Location Location, [address] [place], [place] [address]  Service Code cpt [phone]  Self care activities  Provide education, information  Emotion regulation skills  Validate/empathize  Identify/label emotions  Facilitate problem solving  Comments  Patient agrees to a video session. They are at home and I am in my home office.  Dx.: Depression  Meds: Xanax. Tried antidep. meds, but she has significant side-effects. Goals: They are seeking counseling to resolve significant marital conflict secondary to infidelity. [name] has experienced symptoms of both anxiety and depression during this period and is hoping to reduce these symptoms as the relationship issues improve. [name] is experiencing some depressive symptoms as well, along with considerable anger and frustration. Goal date 6-24   Uma and [name]: Geraldeen says that she and [name] are acting like things are "normal". Went to church together on Sunday. They used to go together in the past. She says she is still in relationship because she wants things to be right and she loves him (but "doesn't know why"). [name] says that he feels much of his agitation at [place] is related to his work frustrations and his inability to separate work and home. They continue a pattern of significant conflict and  hostility followed by periods of ignoring the discord and acting "as if" all is okay. They are locked in a negative cycle of unresolved conflict.  [name], Ph.D.  Time 5:05p-5:55p 50 minutes

## 2022-10-08 ENCOUNTER — Other Ambulatory Visit: Payer: Self-pay

## 2022-10-16 ENCOUNTER — Ambulatory Visit: Payer: BC Managed Care – PPO | Admitting: Psychology

## 2022-10-18 ENCOUNTER — Encounter: Payer: Self-pay | Admitting: Family Medicine

## 2022-10-18 ENCOUNTER — Other Ambulatory Visit: Payer: Self-pay | Admitting: Family Medicine

## 2022-10-19 ENCOUNTER — Encounter: Payer: Self-pay | Admitting: Pharmacist

## 2022-10-19 ENCOUNTER — Other Ambulatory Visit: Payer: Self-pay

## 2022-10-19 ENCOUNTER — Other Ambulatory Visit (HOSPITAL_COMMUNITY): Payer: Self-pay

## 2022-10-19 MED ORDER — HYDROCHLOROTHIAZIDE 25 MG PO TABS
25.0000 mg | ORAL_TABLET | Freq: Every day | ORAL | 0 refills | Status: DC
  Filled 2022-10-19: qty 30, 30d supply, fill #0
  Filled 2022-11-20: qty 30, 30d supply, fill #1
  Filled 2022-12-20: qty 30, 30d supply, fill #2

## 2022-10-19 MED ORDER — ESOMEPRAZOLE MAGNESIUM 40 MG PO CPDR
40.0000 mg | DELAYED_RELEASE_CAPSULE | Freq: Every day | ORAL | 0 refills | Status: DC
  Filled 2022-10-19 – 2022-10-20 (×2): qty 30, 30d supply, fill #0
  Filled 2022-11-20: qty 30, 30d supply, fill #1
  Filled 2022-12-20: qty 30, 30d supply, fill #2

## 2022-10-19 MED ORDER — FAMOTIDINE 40 MG PO TABS
40.0000 mg | ORAL_TABLET | Freq: Every day | ORAL | 1 refills | Status: DC
  Filled 2022-10-19 – 2022-10-20 (×2): qty 30, 30d supply, fill #0
  Filled 2022-12-20: qty 30, 30d supply, fill #1
  Filled 2023-01-27: qty 30, 30d supply, fill #2

## 2022-10-19 NOTE — Telephone Encounter (Signed)
Pt LOV was on 09/22/22 Last refill was done on 09/14/22 Pleas advise

## 2022-10-20 ENCOUNTER — Encounter: Payer: Self-pay | Admitting: Family Medicine

## 2022-10-20 ENCOUNTER — Other Ambulatory Visit (HOSPITAL_COMMUNITY): Payer: Self-pay

## 2022-10-20 ENCOUNTER — Other Ambulatory Visit: Payer: Self-pay

## 2022-10-20 ENCOUNTER — Other Ambulatory Visit: Payer: Self-pay | Admitting: Family Medicine

## 2022-10-20 MED ORDER — AMPHETAMINE-DEXTROAMPHET ER 30 MG PO CP24
30.0000 mg | ORAL_CAPSULE | Freq: Every day | ORAL | 0 refills | Status: DC
  Filled 2022-10-20 (×3): qty 30, 30d supply, fill #0

## 2022-10-20 MED ORDER — AMPHETAMINE-DEXTROAMPHET ER 30 MG PO CP24
30.0000 mg | ORAL_CAPSULE | Freq: Every day | ORAL | 0 refills | Status: DC
  Filled 2022-10-20: qty 30, 30d supply, fill #0

## 2022-10-20 NOTE — Telephone Encounter (Signed)
Done

## 2022-10-20 NOTE — Telephone Encounter (Signed)
Order that was placed on 5/51/24 was cancelled. [name] states the script says do not fill until 12/20/22. Will fill today since the 12/20/22 is active & I gave permission.  Will inform PCP on how to put in prescription so it won't cancel.

## 2022-10-20 NOTE — Telephone Encounter (Signed)
We got this straightened out

## 2022-10-21 ENCOUNTER — Other Ambulatory Visit: Payer: Self-pay

## 2022-10-21 ENCOUNTER — Ambulatory Visit (INDEPENDENT_AMBULATORY_CARE_PROVIDER_SITE_OTHER): Payer: BC Managed Care – PPO | Admitting: Psychology

## 2022-10-21 DIAGNOSIS — F339 Major depressive disorder, recurrent, unspecified: Secondary | ICD-10-CM | POA: Diagnosis not present
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
## 2022-10-28 ENCOUNTER — Other Ambulatory Visit (HOSPITAL_COMMUNITY): Payer: Self-pay

## 2022-10-28 ENCOUNTER — Other Ambulatory Visit: Payer: Self-pay

## 2022-10-29 ENCOUNTER — Encounter: Payer: Self-pay | Admitting: Pharmacist

## 2022-10-29 ENCOUNTER — Other Ambulatory Visit (HOSPITAL_COMMUNITY): Payer: Self-pay

## 2022-10-29 ENCOUNTER — Other Ambulatory Visit: Payer: Self-pay

## 2022-10-30 ENCOUNTER — Other Ambulatory Visit: Payer: Self-pay | Admitting: Family Medicine

## 2022-10-30 ENCOUNTER — Ambulatory Visit: Payer: BC Managed Care – PPO | Admitting: Psychology

## 2022-11-02 ENCOUNTER — Encounter: Payer: Self-pay | Admitting: Family Medicine

## 2022-11-03 ENCOUNTER — Other Ambulatory Visit: Payer: Self-pay

## 2022-11-03 MED ORDER — OXYCODONE HCL 15 MG PO TABS
15.0000 mg | ORAL_TABLET | ORAL | 0 refills | Status: DC | PRN
  Filled 2022-11-03: qty 180, 30d supply, fill #0

## 2022-11-04 ENCOUNTER — Ambulatory Visit: Payer: BC Managed Care – PPO | Admitting: Psychology

## 2022-11-05 ENCOUNTER — Ambulatory Visit (INDEPENDENT_AMBULATORY_CARE_PROVIDER_SITE_OTHER): Payer: BC Managed Care – PPO | Admitting: Psychology

## 2022-11-05 DIAGNOSIS — F339 Major depressive disorder, recurrent, unspecified: Secondary | ICD-10-CM | POA: Diagnosis not present

## 2022-11-05 NOTE — Progress Notes (Signed)
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 (Status: maintained) -- No Description Entered  Medication Status compliance  Safety none  If Suicidal or Homicidal State Action Taken: unspecified  Current Risk: low Medications unspecified Objectives Related Problem: Partners verbally express empathy toward each other and demonstrate shared responsibility for reconstructing the relationship. Description: Clarify what level of trust exists for various aspects of the relationship. Target Date: 2023-05-28 Frequency: Daily Modality: individual Progress: 75%  Related Problem: Partners verbally express empathy toward each other and demonstrate shared responsibility for reconstructing the relationship. Description: List relationship issues that contribute to dissatisfaction and unhappiness and agree to address these in future conjoint sessions. Target Date: 2023-05-28 Frequency: Daily Modality: individual Progress: 70%  Related Problem: Partners verbally express empathy toward each other and demonstrate shared responsibility for reconstructing the relationship. Description: Identify and list behavioral changes for self and partner that would enhance the relationship. Target Date: 2023-05-28 Frequency: Daily Modality: individual Progress: 75%  Related Problem: Partners verbally express empathy toward each other and demonstrate shared responsibility for reconstructing the relationship. Description: Describe pre-affair relationship functioning. Target Date: 2023-05-28 Frequency: Daily Modality:  individual Progress: 80%  Related Problem: Partners verbally express empathy toward each other and demonstrate shared responsibility for reconstructing the relationship. Description: Ask the partners to generate factors that they believe contributed to the affair. Target Date: 2023-05-28 Frequency: Daily Modality: individual Progress: 80%   Problem: Partners verbally express empathy toward each other and demonstrate shared responsibility for reconstructing the relationship. Description: Hurt partner uses anxiety-management techniques to deal with intrusive thoughts about the affair. Target Date: 2023-05-28 Frequency: Daily Modality: individual Progress: 55%  Client Response full compliance  Service Location Location, [address] [place], [place] [address]  Service Code cpt [phone]  Self care activities  Provide education, information  Emotion regulation skills  Validate/empathize  Identify/label emotions  Facilitate problem solving  Comments  Patient agrees to a video (Caregility) session. They are at home and I am in my home office.  Dx.: Depression  Meds: Xanax. Tried antidep. meds, but she has significant side-effects. Goals: They are seeking counseling to resolve significant marital conflict secondary to infidelity. [name] has experienced symptoms of both anxiety and depression during this period and is hoping to reduce these symptoms as the relationship issues improve. [name] is experiencing some depressive symptoms as well, along with considerable anger and frustration. Goal date 12-24   Irvin and [name]: She says they have good and bad days. She is very upset that she caught [employer]. He denies it and she is convinced he is again lying to her. This is a chronic issue between them and keeps the mistrust alive. He is adament that he is telling the truth and she shares convincing evidence that he is in fact watching porn. With each real or perceived deception, the  divide between them grows. He states he wioll NEVER be trusted and she feels that his "ongoing lies" insure she can never build trust. I told them I want to meet with [name] individually next session. I  do not see a path where they can rebuild a relationship at this point and we need to talk about options.                                                                                                                                                                                                       [name], Ph.D.  Time 3:10p-4:00p 50 minutes

## 2022-11-06 ENCOUNTER — Ambulatory Visit: Payer: BC Managed Care – PPO | Admitting: Psychology

## 2022-11-12 NOTE — Telephone Encounter (Signed)
Pt LOV was on 09/22/22 Last refill was done on 08/31/22 Please advise

## 2022-11-13 ENCOUNTER — Other Ambulatory Visit (HOSPITAL_COMMUNITY): Payer: Self-pay

## 2022-11-13 ENCOUNTER — Ambulatory Visit (INDEPENDENT_AMBULATORY_CARE_PROVIDER_SITE_OTHER): Payer: BC Managed Care – PPO | Admitting: Psychology

## 2022-11-13 ENCOUNTER — Other Ambulatory Visit: Payer: Self-pay

## 2022-11-13 DIAGNOSIS — F339 Major depressive disorder, recurrent, unspecified: Secondary | ICD-10-CM

## 2022-11-13 MED ORDER — OXYCODONE HCL 15 MG PO TABS
15.0000 mg | ORAL_TABLET | ORAL | 0 refills | Status: DC | PRN
  Filled 2022-11-13 – 2022-12-01 (×2): qty 180, 30d supply, fill #0

## 2022-11-13 MED ORDER — HYDROMORPHONE HCL 8 MG PO TABS
8.0000 mg | ORAL_TABLET | Freq: Every day | ORAL | 0 refills | Status: DC
  Filled 2022-11-13: qty 30, 30d supply, fill #0
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
## 2022-11-13 NOTE — Telephone Encounter (Signed)
Done

## 2022-11-13 NOTE — Addendum Note (Signed)
Addended by: Gershon Crane A on: 11/13/2022 07:54 AM   Modules accepted: Orders

## 2022-11-16 ENCOUNTER — Other Ambulatory Visit: Payer: Self-pay

## 2022-11-16 ENCOUNTER — Encounter: Payer: Self-pay | Admitting: Family Medicine

## 2022-11-18 ENCOUNTER — Ambulatory Visit (INDEPENDENT_AMBULATORY_CARE_PROVIDER_SITE_OTHER): Payer: BC Managed Care – PPO | Admitting: Psychology

## 2022-11-18 DIAGNOSIS — F339 Major depressive disorder, recurrent, unspecified: Secondary | ICD-10-CM | POA: Diagnosis not present

## 2022-11-18 NOTE — Telephone Encounter (Signed)
I would be happy to let her try a different medication. Have her find out from her insurance company if they will cover any other GLP-1 medication besides Ozempic

## 2022-11-18 NOTE — Progress Notes (Signed)
Date: 11/18/2022  Treatment Plan Diagnosis 296.33 (Major depressive affective disorder, recurrent episode, severe, without mention of psychotic behavior) [n/a]  F43.22 (Adjustment Disorder, With anxiety) [n/a]  Symptoms One partner engages in sexual behavior (e.g., penile-vaginal intercourse, oral sex, anal sex) that violates the explicit or implicit expectations of the relationship. (Status: maintained) -- No Description Entered  Medication Status compliance  Safety none  If Suicidal or Homicidal State Action Taken: unspecified  Current Risk: low Medications unspecified Objectives Related Problem: Partners verbally express empathy toward each other and demonstrate shared responsibility for reconstructing the relationship. Description: Clarify what level of trust exists for various aspects of the relationship. Target Date: 2023-05-28 Frequency: Daily Modality: individual Progress: 75%  Related Problem: Partners verbally express empathy toward each other and demonstrate shared responsibility for reconstructing the relationship. Description: List relationship issues that contribute to dissatisfaction and unhappiness and agree to address these in future conjoint sessions. Target Date: 2023-05-28 Frequency: Daily Modality: individual Progress: 70%  Related Problem: Partners verbally express empathy toward each other and demonstrate shared responsibility for reconstructing the relationship. Description: Identify and list behavioral changes for self and partner that would enhance the relationship. Target Date: 2023-05-28 Frequency: Daily Modality: individual Progress: 75%  Related Problem: Partners verbally express empathy toward each other and demonstrate shared responsibility for reconstructing the relationship. Description: Describe pre-affair relationship  functioning. Target Date: 2023-05-28 Frequency: Daily Modality: individual Progress: 80%  Related Problem: Partners verbally express empathy toward each other and demonstrate shared responsibility for reconstructing the relationship. Description: Ask the partners to generate factors that they believe contributed to the affair. Target Date: 2023-05-28 Frequency: Daily Modality: individual Progress: 80%   Problem: Partners verbally express empathy toward each other and demonstrate shared responsibility for reconstructing the relationship. Description: Hurt partner uses anxiety-management techniques to deal with intrusive thoughts about the affair. Target Date: 2023-05-28 Frequency: Daily Modality: individual Progress: 55%  Client Response full compliance  Service Location Location, [address] [place], [place] [address]  Service Code cpt [id number]  Self care activities  Provide education, information  Emotion regulation skills  Validate/empathize  Identify/label emotions  Facilitate problem solving  Comments  Patient agrees to a video (Caregility) session and understands the limits of this platform. Patient is at home and provider is in home office.  Dx.: Depression  Meds: Xanax. Tried antidep. meds, but she has significant side-effects. Goals: They are seeking counseling to resolve significant marital conflict secondary to infidelity. [name] has experienced symptoms of both anxiety and depression during this period and is hoping to reduce these symptoms as the relationship issues improve. [name] is experiencing some depressive symptoms as well, along with considerable anger and frustration. Goal date 12-24   [name]: She says she has not followed through with last weeks recommendations because of her depression. They are at an impasse and are not able to give each other what they need. Challenged them to talk about what keeps them in marriage and neither can really  answer. Each feels the other is to blame for their unhappiness. Told them it is time to consider a separation. Their co-dependence is keeping them from making  a decision.                                                                                                                                                                                                                   [name], Ph.D.  Time 5:10p-6:00p 50 minutes

## 2022-11-20 ENCOUNTER — Other Ambulatory Visit: Payer: Self-pay | Admitting: Family Medicine

## 2022-11-20 ENCOUNTER — Other Ambulatory Visit (HOSPITAL_COMMUNITY): Payer: Self-pay

## 2022-11-20 MED ORDER — AMPHETAMINE-DEXTROAMPHET ER 30 MG PO CP24
30.0000 mg | ORAL_CAPSULE | Freq: Every day | ORAL | 0 refills | Status: DC
  Filled 2022-11-20: qty 30, 30d supply, fill #0

## 2022-11-20 NOTE — Telephone Encounter (Signed)
Pt Video Visit was on 09/22/22 Last refill was done on 10/20/22 Please advise

## 2022-11-20 NOTE — Telephone Encounter (Signed)
Done

## 2022-11-23 ENCOUNTER — Other Ambulatory Visit: Payer: Self-pay

## 2022-11-23 ENCOUNTER — Other Ambulatory Visit (HOSPITAL_COMMUNITY): Payer: Self-pay

## 2022-11-24 ENCOUNTER — Other Ambulatory Visit (HOSPITAL_COMMUNITY): Payer: Self-pay

## 2022-11-24 ENCOUNTER — Other Ambulatory Visit: Payer: Self-pay

## 2022-11-24 MED ORDER — WEGOVY 1 MG/0.5ML ~~LOC~~ SOAJ
1.0000 mg | SUBCUTANEOUS | 2 refills | Status: DC
  Filled 2022-11-24 – 2022-11-27 (×2): qty 2, 28d supply, fill #0
  Filled 2022-12-21: qty 2, 28d supply, fill #1

## 2022-11-24 NOTE — Telephone Encounter (Signed)
I sent in [facility] for her try. This is a medium strength dose, so we skipped the first two levels. Report back in 4 weeks

## 2022-11-27 ENCOUNTER — Telehealth: Payer: Self-pay | Admitting: Pharmacy Technician

## 2022-11-27 ENCOUNTER — Other Ambulatory Visit (HOSPITAL_COMMUNITY): Payer: Self-pay

## 2022-11-27 ENCOUNTER — Ambulatory Visit (INDEPENDENT_AMBULATORY_CARE_PROVIDER_SITE_OTHER): Payer: BC Managed Care – PPO | Admitting: Psychology

## 2022-11-27 DIAGNOSIS — F339 Major depressive disorder, recurrent, unspecified: Secondary | ICD-10-CM | POA: Diagnosis not present

## 2022-11-27 NOTE — Telephone Encounter (Signed)
Pharmacy Patient Advocate Encounter  Received notification from [facility] that Prior Authorization for [employer] 1MG /0.5ML auto-injectors has been APPROVED from 11/27/2022 to 06/25/2023.Marland Kitchen  PA #/Case ID/Reference #: [id number]  Key: [id number]

## 2022-11-27 NOTE — Progress Notes (Signed)
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 (Status: maintained) -- No Description Entered  Medication Status compliance  Safety none  If Suicidal or Homicidal State Action Taken: unspecified  Current Risk: low Medications unspecified Objectives Related Problem: Partners verbally express empathy toward each other and demonstrate shared responsibility for reconstructing the relationship. Description: Clarify what level of trust exists for various aspects of the relationship. Target Date: 2023-05-28 Frequency: Daily Modality: individual Progress: 75%  Related Problem: Partners verbally express empathy toward each other and demonstrate shared responsibility for reconstructing the relationship. Description: List relationship issues that contribute to dissatisfaction and unhappiness and agree to address these in future conjoint sessions. Target Date: 2023-05-28 Frequency: Daily Modality: individual Progress: 70%  Related Problem: Partners verbally express empathy toward each other and demonstrate shared responsibility for reconstructing the relationship. Description: Identify and list behavioral changes for self and partner that would enhance the relationship. Target Date: 2023-05-28 Frequency: Daily Modality: individual Progress: 75%  Related Problem: Partners verbally express empathy toward each other and demonstrate shared responsibility for reconstructing the relationship. Description: Describe  pre-affair relationship functioning. Target Date: 2023-05-28 Frequency: Daily Modality: individual Progress: 80%  Related Problem: Partners verbally express empathy toward each other and demonstrate shared responsibility for reconstructing the relationship. Description: Ask the partners to generate factors that they believe contributed to the affair. Target Date: 2023-05-28 Frequency: Daily Modality: individual Progress: 80%   Problem: Partners verbally express empathy toward each other and demonstrate shared responsibility for reconstructing the relationship. Description: Hurt partner uses anxiety-management techniques to deal with intrusive thoughts about the affair. Target Date: 2023-05-28 Frequency: Daily Modality: individual Progress: 55%  Client Response full compliance  Service Location Location, [address] [place], [place] [address]  Service Code cpt [phone]  Self care activities  Provide education, information  Emotion regulation skills  Validate/empathize  Identify/label emotions  Facilitate problem solving  Comments  Patient agrees to a video (Caregility) session and understands the limits of this platform. Patient is at home and provider is in home office.  Dx.: Depression  Meds: Xanax. Tried antidep. meds, but she has significant side-effects. Goals: They are seeking counseling to resolve significant marital conflict secondary to infidelity. [name] has experienced symptoms of both anxiety and depression during this period and is hoping to reduce these symptoms as the relationship issues improve. [name] is experiencing some depressive symptoms as well, along with considerable anger and frustration. Goal date 12-24   [name] and [name]: Says that [name] wanted her to come to hotel with him because it was his birthday yesterday. We talked about how all the non-marital stressors in their life impacts their marital relationship. He says that he often comes into a situation  with a great deal of stress and it doesn't take much to set him off. They  are getting  along much better and [name] is being more expressive of feelings. He says it is easier when he isn't feeling blamed for all the problems. She is much relieved when he shares positive feelings for her.                                                                                                                                                                                                                          [name], Ph.D.  Time 11:40a-12:30p 50 minutes

## 2022-11-28 ENCOUNTER — Other Ambulatory Visit (HOSPITAL_COMMUNITY): Payer: Self-pay

## 2022-12-01 ENCOUNTER — Other Ambulatory Visit: Payer: Self-pay

## 2022-12-01 ENCOUNTER — Other Ambulatory Visit: Payer: Self-pay | Admitting: Family Medicine

## 2022-12-01 MED ORDER — GABAPENTIN 300 MG PO CAPS
300.0000 mg | ORAL_CAPSULE | Freq: Three times a day (TID) | ORAL | 1 refills | Status: DC
  Filled 2022-12-01: qty 90, 30d supply, fill #0
  Filled 2023-01-27: qty 90, 30d supply, fill #1
  Filled 2023-02-22 – 2023-02-25 (×2): qty 90, 30d supply, fill #2
  Filled 2023-04-26: qty 90, 30d supply, fill #3
  Filled 2023-06-26 (×2): qty 90, 30d supply, fill #4
  Filled 2023-07-23 – 2023-08-25 (×2): qty 90, 30d supply, fill #5

## 2022-12-02 ENCOUNTER — Ambulatory Visit: Payer: BC Managed Care – PPO | Admitting: Psychology

## 2022-12-02 ENCOUNTER — Other Ambulatory Visit: Payer: Self-pay

## 2022-12-02 ENCOUNTER — Other Ambulatory Visit (HOSPITAL_COMMUNITY): Payer: Self-pay

## 2022-12-02 DIAGNOSIS — F339 Major depressive disorder, recurrent, unspecified: Secondary | ICD-10-CM

## 2022-12-02 NOTE — Progress Notes (Signed)
Date: 12/02/2022  Treatment Plan Diagnosis 296.33 (Major depressive affective disorder, recurrent episode, severe, without mention of psychotic behavior) [n/a]  F43.22 (Adjustment Disorder, With anxiety) [n/a]  Symptoms One partner engages in sexual behavior (e.g., penile-vaginal intercourse, oral sex, anal sex) that violates the explicit or implicit expectations of the relationship. (Status: maintained) -- No Description Entered  Medication Status compliance  Safety none  If Suicidal or Homicidal State Action Taken: unspecified  Current Risk: low Medications unspecified Objectives Related Problem: Partners verbally express empathy toward each other and demonstrate shared responsibility for reconstructing the relationship. Description: Clarify what level of trust exists for various aspects of the relationship. Target Date: 2023-05-28 Frequency: Daily Modality: individual Progress: 75%  Related Problem: Partners verbally express empathy toward each other and demonstrate shared responsibility for reconstructing the relationship. Description: List relationship issues that contribute to dissatisfaction and unhappiness and agree to address these in future conjoint sessions. Target Date: 2023-05-28 Frequency: Daily Modality: individual Progress: 70%  Related Problem: Partners verbally express empathy toward each other and demonstrate shared responsibility for reconstructing the relationship. Description: Identify and list behavioral changes for self and partner that would enhance the relationship. Target Date: 2023-05-28 Frequency: Daily Modality: individual Progress: 75%  Related Problem: Partners verbally express empathy toward each other and demonstrate shared responsibility for reconstructing the  relationship. Description: Describe pre-affair relationship functioning. Target Date: 2023-05-28 Frequency: Daily Modality: individual Progress: 80%  Related Problem: Partners verbally express empathy toward each other and demonstrate shared responsibility for reconstructing the relationship. Description: Ask the partners to generate factors that they believe contributed to the affair. Target Date: 2023-05-28 Frequency: Daily Modality: individual Progress: 80%   Problem: Partners verbally express empathy toward each other and demonstrate shared responsibility for reconstructing the relationship. Description: Hurt partner uses anxiety-management techniques to deal with intrusive thoughts about the affair. Target Date: 2023-05-28 Frequency: Daily Modality: individual Progress: 55%  Client Response full compliance  Service Location Location, [address] [place], [place] [address]  Service Code cpt [phone]  Self care activities  Provide education, information  Emotion regulation skills  Validate/empathize  Identify/label emotions  Facilitate problem solving  Comments  Patient agrees to a video (Caregility) session and understands the limits of this platform. Patient is at home and provider is in home office.  Dx.: Depression  Meds: Xanax. Tried antidep. meds, but she has significant side-effects. Goals: They are seeking counseling to resolve significant marital conflict secondary to infidelity. [name] has experienced symptoms of both anxiety and depression during this period and is hoping to reduce these symptoms as the relationship issues improve. [name] is experiencing some depressive symptoms as well, along with considerable anger and frustration. Goal date 12-24   [name] and [name]: They plan to go home today for the holiday. They had a significant disagreement about something that was based on whether [name] believed [name]. They again hit the impasse that they frequently  confront. [name] feels "it will never be different" and she  is claiming that she lets go and he "punishes" her emotionally. They remained locked in a negative/hostile cycle, but neither is willing to conclude that they are incompatible. Have recommended, at a number of junctures that they may be better served by spending time apart, as their escalations are emotionally damaging and come too close to physical altercations. She feels she has no alternatives and [name] will not make that decision.                                                                                                                                                                                                                            [name], Ph.D.  Time 11:45a-12:30p 45 minutes

## 2022-12-08 ENCOUNTER — Ambulatory Visit (INDEPENDENT_AMBULATORY_CARE_PROVIDER_SITE_OTHER): Payer: BC Managed Care – PPO | Admitting: Psychology

## 2022-12-08 ENCOUNTER — Ambulatory Visit: Payer: BC Managed Care – PPO | Admitting: Psychology

## 2022-12-08 DIAGNOSIS — F339 Major depressive disorder, recurrent, unspecified: Secondary | ICD-10-CM

## 2022-12-08 NOTE — Progress Notes (Addendum)
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 (Status: maintained) -- No Description Entered  Medication Status compliance  Safety none  If Suicidal or Homicidal State Action Taken: unspecified  Current Risk: low Medications unspecified Objectives Related Problem: Partners verbally express empathy toward each other and demonstrate shared responsibility for reconstructing the relationship. Description: Clarify what level of trust exists for various aspects of the relationship. Target Date: 2023-05-28 Frequency: Daily Modality: individual Progress: 75%  Related Problem: Partners verbally express empathy toward each other and demonstrate shared responsibility for reconstructing the relationship. Description: List relationship issues that contribute to dissatisfaction and unhappiness and agree to address these in future conjoint sessions. Target Date: 2023-05-28 Frequency: Daily Modality: individual Progress: 70%  Related Problem: Partners verbally express empathy toward each other and demonstrate shared responsibility for reconstructing the relationship. Description: Identify and list behavioral changes for self and partner that would enhance the relationship. Target Date: 2023-05-28 Frequency: Daily Modality: individual Progress: 75%  Related Problem: Partners verbally express empathy toward each other and demonstrate shared responsibility  for reconstructing the relationship. Description: Describe pre-affair relationship functioning. Target Date: 2023-05-28 Frequency: Daily Modality: individual Progress: 80%  Related Problem: Partners verbally express empathy toward each other and demonstrate shared responsibility for reconstructing the relationship. Description: Ask the partners to generate factors that they believe contributed to the affair. Target Date: 2023-05-28 Frequency: Daily Modality: individual Progress: 80%   Problem: Partners verbally express empathy toward each other and demonstrate shared responsibility for reconstructing the relationship. Description: Hurt partner uses anxiety-management techniques to deal with intrusive thoughts about the affair. Target Date: 2023-05-28 Frequency: Daily Modality: individual Progress: 55%  Client Response full compliance  Service Location Location, [address] [place], [place] [address]  Service Code cpt [phone]  Self care activities  Provide education, information  Emotion regulation skills  Validate/empathize  Identify/label emotions  Facilitate problem solving  Comments  Patient agrees to a video (Caregility) session and understands the limits of this platform. Patient is at home and provider is in home office.  Dx.: Depression  Meds: Xanax. Tried antidep. meds, but she has significant side-effects. Goals: They are seeking counseling to resolve significant marital conflict secondary to infidelity. [name] has experienced symptoms of both anxiety and depression during this period and is hoping to reduce these symptoms as the relationship issues improve. [name] is experiencing some depressive symptoms as well, along with considerable anger and frustration. Goal date 12-24   [name] and [name]: After last session, [name] states that [name] got angry and threw pillow at her and he was raging. [name] says that [name] acts as though she can say  anything and she always  "brings up the other woman". He feels that is her "default". After session and argument, he just left and went home without her. She had to drive home in other vehicle. He said that their daughter is also now back to saying that [name] "ruined everything" when he had an affair.  She is saying that she "cannot do this anymore" and feels stuck. She says she has been trying to "end the conflict" all week and he will not stop getting off of her case. [name] says half the time that they have a session, he says [name] cuts him off and calls him a Sales promotion account executive. He is going to go to [place] and she is going to stay home at my recommendation. Need to re-discuss separation options.                                                                                                                                                                                                                           [name], Ph.D.  Time 11:45a-12:30p 45 minutes

## 2022-12-11 ENCOUNTER — Ambulatory Visit: Payer: BC Managed Care – PPO | Admitting: Psychology

## 2022-12-16 ENCOUNTER — Ambulatory Visit: Payer: BC Managed Care – PPO | Admitting: Psychology

## 2022-12-16 DIAGNOSIS — F339 Major depressive disorder, recurrent, unspecified: Secondary | ICD-10-CM | POA: Diagnosis not present

## 2022-12-16 NOTE — Progress Notes (Signed)
Date: 12/16/2022  Treatment Plan Diagnosis 296.33 (Major depressive affective disorder, recurrent episode, severe, without mention of psychotic behavior) [n/a]  F43.22 (Adjustment Disorder, With anxiety) [n/a]  Symptoms One partner engages in sexual behavior (e.g., penile-vaginal intercourse, oral sex, anal sex) that violates the explicit or implicit expectations of the relationship. (Status: maintained) -- No Description Entered  Medication Status compliance  Safety none  If Suicidal or Homicidal State Action Taken: unspecified  Current Risk: low Medications unspecified Objectives Related Problem: Partners verbally express empathy toward each other and demonstrate shared responsibility for reconstructing the relationship. Description: Clarify what level of trust exists for various aspects of the relationship. Target Date: 2023-05-28 Frequency: Daily Modality: individual Progress: 75%  Related Problem: Partners verbally express empathy toward each other and demonstrate shared responsibility for reconstructing the relationship. Description: List relationship issues that contribute to dissatisfaction and unhappiness and agree to address these in future conjoint sessions. Target Date: 2023-05-28 Frequency: Daily Modality: individual Progress: 70%  Related Problem: Partners verbally express empathy toward each other and demonstrate shared responsibility for reconstructing the relationship. Description: Identify and list behavioral changes for self and partner that would enhance the relationship. Target Date: 2023-05-28 Frequency: Daily Modality: individual Progress: 75%  Related Problem: Partners verbally express empathy toward each other and  demonstrate shared responsibility for reconstructing the relationship. Description: Describe pre-affair relationship functioning. Target Date: 2023-05-28 Frequency: Daily Modality: individual Progress: 80%  Related Problem: Partners verbally express empathy toward each other and demonstrate shared responsibility for reconstructing the relationship. Description: Ask the partners to generate factors that they believe contributed to the affair. Target Date: 2023-05-28 Frequency: Daily Modality: individual Progress: 80%   Problem: Partners verbally express empathy toward each other and demonstrate shared responsibility for reconstructing the relationship. Description: Hurt partner uses anxiety-management techniques to deal with intrusive thoughts about the affair. Target Date: 2023-05-28 Frequency: Daily Modality: individual Progress: 55%  Client Response full compliance  Service Location Location, [address] [place], [place] [address]  Service Code cpt [phone]  Self care activities  Provide education, information  Emotion regulation skills  Validate/empathize  Identify/label emotions  Facilitate problem solving  Comments  Patient agrees to a video (Caregility) session and understands the limits of this platform. Patient is at home and provider is in home office.  Dx.: Depression  Meds: Xanax. Tried antidep. meds, but she has significant side-effects. Goals: They are seeking counseling to resolve significant marital conflict secondary to infidelity. [name] has experienced symptoms of both anxiety and depression during this period and is hoping to reduce these symptoms as the relationship issues improve. [name] is experiencing some depressive symptoms as well, along with considerable anger and frustration. Goal date 12-24   [name] and [name]: He went to [place] and it was "a big waste of time". He  is seeking an attorney to deal with Government social research officer". People on the job  complained about him to the state. He wants to sue.She feels [name] is satisfied with the current marital state. He says that it is the external stressors that create problems between them.  [name] is upset that her father is posting about dating another woman and this is close to his wedding anniversary to [name]'s mother and the anniversary of her death (4 years ago). She feels her dad is being disrespectful to her mother. Discussed how to talk with her father about her feelings.                                                                                                                                                                                                                               [name], Ph.D.  Time 5:10-6:00p 50 minutes

## 2022-12-21 ENCOUNTER — Other Ambulatory Visit: Payer: Self-pay | Admitting: Family Medicine

## 2022-12-21 ENCOUNTER — Other Ambulatory Visit: Payer: Self-pay

## 2022-12-21 DIAGNOSIS — G5771 Causalgia of right lower limb: Secondary | ICD-10-CM | POA: Diagnosis not present

## 2022-12-22 ENCOUNTER — Other Ambulatory Visit: Payer: Self-pay

## 2022-12-22 ENCOUNTER — Other Ambulatory Visit (HOSPITAL_COMMUNITY): Payer: Self-pay

## 2022-12-22 DIAGNOSIS — G5771 Causalgia of right lower limb: Secondary | ICD-10-CM | POA: Diagnosis not present

## 2022-12-22 MED ORDER — AMPHETAMINE-DEXTROAMPHET ER 30 MG PO CP24
30.0000 mg | ORAL_CAPSULE | Freq: Every day | ORAL | 0 refills | Status: DC
  Filled 2022-12-22: qty 30, 30d supply, fill #0

## 2022-12-22 MED ORDER — AMPHETAMINE-DEXTROAMPHET ER 30 MG PO CP24
30.0000 mg | ORAL_CAPSULE | Freq: Every day | ORAL | 0 refills | Status: DC
  Filled 2022-12-22 – 2023-01-22 (×2): qty 30, 30d supply, fill #0

## 2022-12-22 MED ORDER — VITAMIN D (ERGOCALCIFEROL) 1.25 MG (50000 UNIT) PO CAPS
50000.0000 [IU] | ORAL_CAPSULE | ORAL | 3 refills | Status: DC
  Filled 2022-12-22: qty 4, 28d supply, fill #0
  Filled 2023-01-18: qty 4, 28d supply, fill #1
  Filled 2023-04-26: qty 4, 28d supply, fill #2
  Filled 2023-06-26 (×2): qty 4, 28d supply, fill #3
  Filled 2023-07-23: qty 4, 28d supply, fill #4
  Filled 2023-09-29: qty 4, 28d supply, fill #5
  Filled 2023-10-24: qty 4, 28d supply, fill #6

## 2022-12-22 MED ORDER — AMPHETAMINE-DEXTROAMPHET ER 30 MG PO CP24
30.0000 mg | ORAL_CAPSULE | Freq: Every day | ORAL | 0 refills | Status: DC
Start: 2023-02-22 — End: 2023-03-08
  Filled 2022-12-22 – 2023-02-22 (×2): qty 30, 30d supply, fill #0

## 2022-12-22 NOTE — Telephone Encounter (Signed)
Done

## 2022-12-23 ENCOUNTER — Other Ambulatory Visit: Payer: Self-pay | Admitting: Family Medicine

## 2022-12-25 ENCOUNTER — Other Ambulatory Visit: Payer: Self-pay

## 2022-12-25 ENCOUNTER — Ambulatory Visit (INDEPENDENT_AMBULATORY_CARE_PROVIDER_SITE_OTHER): Payer: BC Managed Care – PPO | Admitting: Psychology

## 2022-12-25 ENCOUNTER — Encounter: Payer: Self-pay | Admitting: Family Medicine

## 2022-12-25 ENCOUNTER — Telehealth: Payer: BC Managed Care – PPO | Admitting: Family Medicine

## 2022-12-25 ENCOUNTER — Other Ambulatory Visit (HOSPITAL_COMMUNITY): Payer: Self-pay

## 2022-12-25 VITALS — Ht 69.5 in | Wt 240.0 lb

## 2022-12-25 DIAGNOSIS — M48061 Spinal stenosis, lumbar region without neurogenic claudication: Secondary | ICD-10-CM

## 2022-12-25 DIAGNOSIS — F339 Major depressive disorder, recurrent, unspecified: Secondary | ICD-10-CM

## 2022-12-25 DIAGNOSIS — G90523 Complex regional pain syndrome I of lower limb, bilateral: Secondary | ICD-10-CM | POA: Insufficient documentation

## 2022-12-25 DIAGNOSIS — F1190 Opioid use, unspecified, uncomplicated: Secondary | ICD-10-CM

## 2022-12-25 MED ORDER — HYDROMORPHONE HCL 8 MG PO TABS
8.0000 mg | ORAL_TABLET | Freq: Every day | ORAL | 0 refills | Status: DC
  Filled 2022-12-25: qty 30, 30d supply, fill #0

## 2022-12-25 MED ORDER — HYDROMORPHONE HCL 8 MG PO TABS
8.0000 mg | ORAL_TABLET | Freq: Every day | ORAL | 0 refills | Status: DC
  Filled 2022-12-25 – 2023-02-11 (×2): qty 30, 30d supply, fill #0

## 2022-12-25 MED ORDER — OXYCODONE HCL 15 MG PO TABS
15.0000 mg | ORAL_TABLET | ORAL | 0 refills | Status: DC | PRN
  Filled 2022-12-25 – 2023-01-01 (×2): qty 180, 30d supply, fill #0

## 2022-12-25 MED ORDER — OXYCODONE HCL 15 MG PO TABS
15.0000 mg | ORAL_TABLET | ORAL | 0 refills | Status: DC | PRN
  Filled 2022-12-25 – 2023-02-02 (×2): qty 180, 30d supply, fill #0

## 2022-12-25 MED ORDER — OXYCODONE HCL 15 MG PO TABS
15.0000 mg | ORAL_TABLET | ORAL | 0 refills | Status: DC | PRN
  Filled 2022-12-25 – 2023-03-06 (×2): qty 180, 30d supply, fill #0

## 2022-12-25 MED ORDER — WEGOVY 1.7 MG/0.75ML ~~LOC~~ SOAJ
1.7000 mg | SUBCUTANEOUS | 5 refills | Status: DC
Start: 2022-12-25 — End: 2023-03-22
  Filled 2022-12-25: qty 3, fill #0
  Filled 2023-01-03: qty 3, 28d supply, fill #0
  Filled 2023-01-27: qty 3, 28d supply, fill #1
  Filled 2023-02-24 – 2023-03-08 (×3): qty 3, 28d supply, fill #2

## 2022-12-25 NOTE — Progress Notes (Signed)
Date: 12/25/2022  Treatment Plan Diagnosis 296.33 (Major depressive affective disorder, recurrent episode, severe, without mention of psychotic behavior) [n/a]  F43.22 (Adjustment Disorder, With anxiety) [n/a]  Symptoms One partner engages in sexual behavior (e.g., penile-vaginal intercourse, oral sex, anal sex) that violates the explicit or implicit expectations of the relationship. (Status: maintained) -- No Description Entered  Medication Status compliance  Safety none  If Suicidal or Homicidal State Action Taken: unspecified  Current Risk: low Medications unspecified Objectives Related Problem: Partners verbally express empathy toward each other and demonstrate shared responsibility for reconstructing the relationship. Description: Clarify what level of trust exists for various aspects of the relationship. Target Date: 2023-05-28 Frequency: Daily Modality: individual Progress: 75%  Related Problem: Partners verbally express empathy toward each other and demonstrate shared responsibility for reconstructing the relationship. Description: List relationship issues that contribute to dissatisfaction and unhappiness and agree to address these in future conjoint sessions. Target Date: 2023-05-28 Frequency: Daily Modality: individual Progress: 70%  Related Problem: Partners verbally express empathy toward each other and demonstrate shared responsibility for reconstructing the relationship. Description: Identify and list behavioral changes for self and partner that would enhance the relationship. Target Date: 2023-05-28 Frequency: Daily Modality: individual Progress: 75%  Related Problem: Partners verbally express empathy toward each other and demonstrate shared responsibility for reconstructing the relationship. Description: Describe pre-affair relationship functioning. Target Date: 2023-05-28 Frequency: Daily Modality: individual Progress: 80%  Related  Problem: Partners verbally express empathy toward each other and demonstrate shared responsibility for reconstructing the relationship. Description: Ask the partners to generate factors that they believe contributed to the affair. Target Date: 2023-05-28 Frequency: Daily Modality: individual Progress: 80%   Problem: Partners verbally express empathy toward each other and demonstrate shared responsibility for reconstructing the relationship. Description: Hurt partner uses anxiety-management techniques to deal with intrusive thoughts about the affair. Target Date: 2023-05-28 Frequency: Daily Modality: individual Progress: 55%  Client Response full compliance  Service Location Location, [address] [place], [place] [address]  Service Code cpt [phone]  Self care activities  Provide education, information  Emotion regulation skills  Validate/empathize  Identify/label emotions  Facilitate problem solving  Comments  Patient agrees to a video (Caregility) session and understands the limits of this platform. Patient is at home and provider is in home office.  Dx.: Depression  Meds: Xanax. Tried antidep. meds, but she has significant side-effects. Goals: They are seeking counseling to resolve significant marital conflict secondary to infidelity. [name] has experienced symptoms of both anxiety and depression during this period and is hoping to reduce these symptoms as the relationship issues improve. [name] is experiencing some depressive symptoms as well, along with considerable anger and frustration. Goal date 12-24   [name] and [name]: [name] had her injections on Monday for her pain and has to return in 2 weeks. She still has foot pain, but much less. [name] has not been working much this week and they have been dealing with lots of issues regarding their children. Daughter is having problems and [name] says her depression and anxiety has changed her. They continue to be disappointed with  their son who is not truthful. [name] and [name] have been getting along around these difficult issues.  [name], Ph.D.  Time 11:40a-12:30p 50 minutes

## 2022-12-25 NOTE — Progress Notes (Signed)
   Subjective:    Patient ID: Jory Ee, female    DOB: 1976-03-02, 47 y.o.   MRN: [id number]  HPI Here for pain management. Her back pain is about the same. She had another nerve block per Dr. [name] recently, and it is too early to tell if this will help much. She has also developed complex regional pain syndrome in both feet. They turn red, they swell, and they are very painful and sensitive to touch.    Review of Systems  Constitutional: Negative.   Musculoskeletal:  Positive for back pain.       Objective:   Physical Exam        Assessment & Plan:  Pain management. Indication for chronic opioid: low back pain Medication and dose: Oxycodone 15 mg and Dilaudid 8 mg # pills per month: 180 and 30 Last UDS date: 08-27-22 Opioid Treatment Agreement signed (Y/N): 04-17-19 Opioid Treatment Agreement last reviewed with patient:  12-25-22 [facility] reviewed this encounter (include red flags): Yes Meds were refilled.  Gershon Crane, MD

## 2022-12-30 ENCOUNTER — Ambulatory Visit (INDEPENDENT_AMBULATORY_CARE_PROVIDER_SITE_OTHER): Payer: BC Managed Care – PPO | Admitting: Psychology

## 2022-12-30 DIAGNOSIS — F332 Major depressive disorder, recurrent severe without psychotic features: Secondary | ICD-10-CM | POA: Diagnosis not present

## 2022-12-30 DIAGNOSIS — F4322 Adjustment disorder with anxiety: Secondary | ICD-10-CM

## 2022-12-30 DIAGNOSIS — F339 Major depressive disorder, recurrent, unspecified: Secondary | ICD-10-CM

## 2022-12-30 NOTE — Progress Notes (Signed)
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 (Status: maintained) -- No Description Entered  Medication Status compliance  Safety none  If Suicidal or Homicidal State Action Taken: unspecified  Current Risk: low Medications unspecified Objectives Related Problem: Partners verbally express empathy toward each other and demonstrate shared responsibility for reconstructing the relationship. Description: Clarify what level of trust exists for various aspects of the relationship. Target Date: 2023-05-28 Frequency: Daily Modality: individual Progress: 75%  Related Problem: Partners verbally express empathy toward each other and demonstrate shared responsibility for reconstructing the relationship. Description: List relationship issues that contribute to dissatisfaction and unhappiness and agree to address these in future conjoint sessions. Target Date: 2023-05-28 Frequency: Daily Modality: individual Progress: 70%  Related Problem: Partners verbally express empathy toward each other and demonstrate shared responsibility for reconstructing the relationship. Description: Identify and list behavioral changes for self and partner that would enhance the relationship. Target Date: 2023-05-28 Frequency: Daily Modality: individual Progress: 75%  Related Problem: Partners verbally express empathy toward each other and demonstrate shared responsibility for reconstructing the relationship. Description: Describe pre-affair relationship functioning. Target Date: 2023-05-28 Frequency: Daily Modality:  individual Progress: 80%  Related Problem: Partners verbally express empathy toward each other and demonstrate shared responsibility for reconstructing the relationship. Description: Ask the partners to generate factors that they believe contributed to the affair. Target Date: 2023-05-28 Frequency: Daily Modality: individual Progress: 80%   Problem: Partners verbally express empathy toward each other and demonstrate shared responsibility for reconstructing the relationship. Description: Hurt partner uses anxiety-management techniques to deal with intrusive thoughts about the affair. Target Date: 2023-05-28 Frequency: Daily Modality: individual Progress: 55%  Client Response full compliance  Service Location Location, [address] [place], [place] [address]  Service Code cpt [phone]  Self care activities  Provide education, information  Emotion regulation skills  Validate/empathize  Identify/label emotions  Facilitate problem solving  Comments  Patient agrees to a video (Caregility) session and understands the limits of this platform. Patient is at home and provider is in home office.  Dx.: Depression  Meds: Xanax. Tried antidep. meds, but she has significant side-effects. Goals: They are seeking counseling to resolve significant marital conflict secondary to infidelity. [name] has experienced symptoms of both anxiety and depression during this period and is hoping to reduce these symptoms as the relationship issues improve. [name] is experiencing some depressive symptoms as well, along with considerable anger and frustration. Goal date 12-24   [name] and [name]: They report they have been getting along better this week. They had their grandson all day Saturday. They both feel that there son and daughter are different and disappointing. [name] is tired of his job and they both are interested in moving away. He is applying to other jobs, many out of town. In spite of their kids and  grandchildren, they feel it is healthier to leave. In fact, we discussed that change in venue will benefit their relationship. Discussed my vacation and how to manage in my absence. Will record any discord to be discussed upon my return.  [name], Ph.D.  Time 11:40a-12:30p 50 minutes

## 2023-01-01 ENCOUNTER — Other Ambulatory Visit: Payer: Self-pay

## 2023-01-01 ENCOUNTER — Telehealth: Payer: BC Managed Care – PPO | Admitting: Physician Assistant

## 2023-01-01 DIAGNOSIS — R3989 Other symptoms and signs involving the genitourinary system: Secondary | ICD-10-CM

## 2023-01-01 MED ORDER — SULFAMETHOXAZOLE-TRIMETHOPRIM 800-160 MG PO TABS: 1.0000 | ORAL_TABLET | Freq: Two times a day (BID) | ORAL | 0 refills | Status: DC

## 2023-01-01 NOTE — Progress Notes (Signed)
E-Visit for Urinary Problems  We are sorry that you are not feeling well.  Here is how we plan to help!  Based on what you shared with me it looks like you most likely have a simple urinary tract infection.  A UTI (Urinary Tract Infection) is a bacterial infection of the bladder.  Most cases of urinary tract infections are simple to treat but a key part of your care is to encourage you to drink plenty of fluids and watch your symptoms carefully.  I have prescribed Bactrim DS One tablet twice a day for 5 days.  Your symptoms should gradually improve. Call us if the burning in your urine worsens, you develop worsening fever, back pain or pelvic pain or if your symptoms do not resolve after completing the antibiotic.  Urinary tract infections can be prevented by drinking plenty of water to keep your body hydrated.  Also be sure when you wipe, wipe from front to back and don't hold it in!  If possible, empty your bladder every 4 hours.  HOME CARE Drink plenty of fluids Compete the full course of the antibiotics even if the symptoms resolve Remember, when you need to go.go. Holding in your urine can increase the likelihood of getting a UTI! GET HELP RIGHT AWAY IF: You cannot urinate You get a high fever Worsening back pain occurs You see blood in your urine You feel sick to your stomach or throw up You feel like you are going to pass out  MAKE SURE YOU  Understand these instructions. Will watch your condition. Will get help right away if you are not doing well or get worse.   Thank you for choosing an e-visit.  Your e-visit answers were reviewed by a board certified advanced clinical practitioner to complete your personal care plan. Depending upon the condition, your plan could have included both over the counter or prescription medications.  Please review your pharmacy choice. Make sure the pharmacy is open so you can pick up prescription now. If there is a problem, you may contact  your provider through MyChart messaging and have the prescription routed to another pharmacy.  Your safety is important to us. If you have drug allergies check your prescription carefully.   For the next 24 hours you can use MyChart to ask questions about today's visit, request a non-urgent call back, or ask for a work or school excuse. You will get an email in the next two days asking about your experience. I hope that your e-visit has been valuable and will speed your recovery.  I have spent 5 minutes in review of e-visit questionnaire, review and updating patient chart, medical decision making and response to patient.    M , PA-C  

## 2023-01-04 ENCOUNTER — Other Ambulatory Visit: Payer: Self-pay

## 2023-01-11 DIAGNOSIS — G5771 Causalgia of right lower limb: Secondary | ICD-10-CM | POA: Diagnosis not present

## 2023-01-12 DIAGNOSIS — G5771 Causalgia of right lower limb: Secondary | ICD-10-CM | POA: Diagnosis not present

## 2023-01-15 ENCOUNTER — Other Ambulatory Visit: Payer: Self-pay | Admitting: Family Medicine

## 2023-01-18 ENCOUNTER — Other Ambulatory Visit (HOSPITAL_COMMUNITY): Payer: Self-pay

## 2023-01-18 ENCOUNTER — Other Ambulatory Visit: Payer: Self-pay | Admitting: Family Medicine

## 2023-01-19 ENCOUNTER — Other Ambulatory Visit: Payer: Self-pay

## 2023-01-20 ENCOUNTER — Other Ambulatory Visit (HOSPITAL_COMMUNITY): Payer: Self-pay

## 2023-01-20 ENCOUNTER — Other Ambulatory Visit: Payer: Self-pay

## 2023-01-20 MED ORDER — ESOMEPRAZOLE MAGNESIUM 40 MG PO CPDR
40.0000 mg | DELAYED_RELEASE_CAPSULE | Freq: Every day | ORAL | 0 refills | Status: DC
Start: 2023-01-20 — End: 2023-04-26
  Filled 2023-01-20: qty 30, 30d supply, fill #0
  Filled 2023-02-22: qty 30, 30d supply, fill #1
  Filled 2023-03-23: qty 30, 30d supply, fill #2

## 2023-01-20 MED ORDER — HYDROCHLOROTHIAZIDE 25 MG PO TABS
25.0000 mg | ORAL_TABLET | Freq: Every day | ORAL | 0 refills | Status: DC
Start: 2023-01-20 — End: 2023-04-26
  Filled 2023-01-20: qty 30, 30d supply, fill #0
  Filled 2023-02-22: qty 30, 30d supply, fill #1
  Filled 2023-03-23: qty 30, 30d supply, fill #2

## 2023-01-21 ENCOUNTER — Other Ambulatory Visit (HOSPITAL_COMMUNITY): Payer: Self-pay

## 2023-01-21 ENCOUNTER — Other Ambulatory Visit: Payer: Self-pay | Admitting: Family Medicine

## 2023-01-22 ENCOUNTER — Other Ambulatory Visit (HOSPITAL_COMMUNITY): Payer: Self-pay

## 2023-01-22 ENCOUNTER — Ambulatory Visit: Payer: BC Managed Care – PPO | Admitting: Psychology

## 2023-01-22 DIAGNOSIS — F32A Depression, unspecified: Secondary | ICD-10-CM | POA: Diagnosis not present

## 2023-01-22 DIAGNOSIS — F339 Major depressive disorder, recurrent, unspecified: Secondary | ICD-10-CM

## 2023-01-22 NOTE — Progress Notes (Signed)
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 (Status: maintained) -- No Description Entered  Medication Status compliance  Safety none  If Suicidal or Homicidal State Action Taken: unspecified  Current Risk: low Medications unspecified Objectives Related Problem: Partners verbally express empathy toward each other and demonstrate shared responsibility for reconstructing the relationship. Description: Clarify what level of trust exists for various aspects of the relationship. Target Date: 2023-05-28 Frequency: Daily Modality: individual Progress: 75%  Related Problem: Partners verbally express empathy toward each other and demonstrate shared responsibility for reconstructing the relationship. Description: List relationship issues that contribute to dissatisfaction and unhappiness and agree to address these in future conjoint sessions. Target Date: 2023-05-28 Frequency: Daily Modality: individual Progress: 70%  Related Problem: Partners verbally express empathy toward each other and demonstrate shared responsibility for reconstructing the relationship. Description: Identify and list behavioral changes for self and partner that would enhance the relationship. Target Date: 2023-05-28 Frequency: Daily Modality: individual Progress: 75%  Related Problem: Partners verbally express empathy toward each other and demonstrate shared responsibility for reconstructing the relationship. Description: Describe pre-affair relationship functioning. Target Date:  2023-05-28 Frequency: Daily Modality: individual Progress: 80%  Related Problem: Partners verbally express empathy toward each other and demonstrate shared responsibility for reconstructing the relationship. Description: Ask the partners to generate factors that they believe contributed to the affair. Target Date: 2023-05-28 Frequency: Daily Modality: individual Progress: 80%   Problem: Partners verbally express empathy toward each other and demonstrate shared responsibility for reconstructing the relationship. Description: Hurt partner uses anxiety-management techniques to deal with intrusive thoughts about the affair. Target Date: 2023-05-28 Frequency: Daily Modality: individual Progress: 55%  Client Response full compliance  Service Location Location, [address] [place], [place] [address]  Service Code cpt [phone]  Self care activities  Provide education, information  Emotion regulation skills  Validate/empathize  Identify/label emotions  Facilitate problem solving  Comments  Patient agrees to a video (Caregility) session and understands the limits of this platform. Patient is at home and provider is in home office.  Dx.: Depression  Meds: Xanax. Tried antidep. meds, but she has significant side-effects. Goals: They are seeking counseling to resolve significant marital conflict secondary to infidelity. [name] has experienced symptoms of both anxiety and depression during this period and is hoping to reduce these symptoms as the relationship issues improve. [name] is experiencing some depressive symptoms as well, along with considerable anger and frustration. Goal date 12-24   [name] and [name]: [name] was in [place] for a week for work. [name] stayed at the hotel and says she was fine. They each talked about how they tend to disconnect when they feel the other is not understanding a communication. She continues to feel that [name] does not "put the family first", and still  expresses resentment for how her daughter has changed and is less interested in the family. We talked about the need to address the issue of blame and resentment. Will have each list their resentments for additional discussion.  [name], Ph.D.  Time 11:40a-12:30p 50 minutes

## 2023-01-27 ENCOUNTER — Ambulatory Visit (INDEPENDENT_AMBULATORY_CARE_PROVIDER_SITE_OTHER): Payer: BC Managed Care – PPO | Admitting: Psychology

## 2023-01-27 ENCOUNTER — Other Ambulatory Visit (HOSPITAL_COMMUNITY): Payer: Self-pay

## 2023-01-27 DIAGNOSIS — F32A Depression, unspecified: Secondary | ICD-10-CM | POA: Diagnosis not present

## 2023-01-27 DIAGNOSIS — F339 Major depressive disorder, recurrent, unspecified: Secondary | ICD-10-CM

## 2023-01-27 NOTE — Progress Notes (Signed)
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 (Status: maintained) -- No Description Entered  Medication Status compliance  Safety none  If Suicidal or Homicidal State Action Taken: unspecified  Current Risk: low Medications unspecified Objectives Related Problem: Partners verbally express empathy toward each other and demonstrate shared responsibility for reconstructing the relationship. Description: Clarify what level of trust exists for various aspects of the relationship. Target Date: 2023-05-28 Frequency: Daily Modality: individual Progress: 75%  Related Problem: Partners verbally express empathy toward each other and demonstrate shared responsibility for reconstructing the relationship. Description: List relationship issues that contribute to dissatisfaction and unhappiness and agree to address these in future conjoint sessions. Target Date: 2023-05-28 Frequency: Daily Modality: individual Progress: 70%  Related Problem: Partners verbally express empathy toward each other and demonstrate shared responsibility for reconstructing the relationship. Description: Identify and list behavioral changes for self and partner that would enhance the relationship. Target Date: 2023-05-28 Frequency: Daily Modality: individual Progress: 75%  Related Problem: Partners verbally express empathy toward each other and demonstrate shared responsibility for reconstructing the relationship. Description: Describe pre-affair relationship  functioning. Target Date: 2023-05-28 Frequency: Daily Modality: individual Progress: 80%  Related Problem: Partners verbally express empathy toward each other and demonstrate shared responsibility for reconstructing the relationship. Description: Ask the partners to generate factors that they believe contributed to the affair. Target Date: 2023-05-28 Frequency: Daily Modality: individual Progress: 80%   Problem: Partners verbally express empathy toward each other and demonstrate shared responsibility for reconstructing the relationship. Description: Hurt partner uses anxiety-management techniques to deal with intrusive thoughts about the affair. Target Date: 2023-05-28 Frequency: Daily Modality: individual Progress: 55%  Client Response full compliance  Service Location Location, [address] [place], [place] [address]  Service Code cpt [phone]  Self care activities  Provide education, information  Emotion regulation skills  Validate/empathize  Identify/label emotions  Facilitate problem solving  Comments  Patient agrees to a video (Caregility) session and understands the limits of this platform. Patient is at home and provider is in home office.  Dx.: Depression  Meds: Xanax. Tried antidep. meds, but she has significant side-effects. Goals: They are seeking counseling to resolve significant marital conflict secondary to infidelity. [name] has experienced symptoms of both anxiety and depression during this period and is hoping to reduce these symptoms as the relationship issues improve. [name] is experiencing some depressive symptoms as well, along with considerable anger and frustration. Goal date 12-24   [name] and [name]: [name] is in [place] working for the week. [name] says she just stay home feeling depressed. She and [name] are planning to go to the beach this weekend with their adopted daughter and their grandson (with his mother). Her sadness in the relationship now is  that she no longer sees the [place] that is compassionate and caring. We discussed the reason why [name] may be struggling with sharing loving feelings toward her, related to parental rejection.  [name] cannot get away from her focus on why [name] was able to share these type of feelings with the other woman. She maintains that she has to understand his behavior in order to move past it. She is convinced that he is in love with the other woman and not her. She will not let herself move forward until she has "the answer" of why he has changed. She doesn't believe [name] is sincere.                                               [name], Ph.D.  Time 5:10p-6:00p 50 minutes

## 2023-01-28 ENCOUNTER — Other Ambulatory Visit: Payer: Self-pay

## 2023-02-02 ENCOUNTER — Other Ambulatory Visit (HOSPITAL_COMMUNITY): Payer: Self-pay

## 2023-02-03 ENCOUNTER — Other Ambulatory Visit: Payer: Self-pay

## 2023-02-05 ENCOUNTER — Ambulatory Visit (INDEPENDENT_AMBULATORY_CARE_PROVIDER_SITE_OTHER): Payer: BC Managed Care – PPO | Admitting: Psychology

## 2023-02-05 DIAGNOSIS — F339 Major depressive disorder, recurrent, unspecified: Secondary | ICD-10-CM

## 2023-02-05 DIAGNOSIS — F32A Depression, unspecified: Secondary | ICD-10-CM | POA: Diagnosis not present
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
## 2023-02-10 ENCOUNTER — Ambulatory Visit: Payer: BC Managed Care – PPO | Admitting: Psychology

## 2023-02-11 ENCOUNTER — Ambulatory Visit (INDEPENDENT_AMBULATORY_CARE_PROVIDER_SITE_OTHER): Payer: BC Managed Care – PPO | Admitting: Psychology

## 2023-02-11 ENCOUNTER — Other Ambulatory Visit: Payer: Self-pay

## 2023-02-11 ENCOUNTER — Other Ambulatory Visit (HOSPITAL_COMMUNITY): Payer: Self-pay

## 2023-02-11 DIAGNOSIS — F339 Major depressive disorder, recurrent, unspecified: Secondary | ICD-10-CM

## 2023-02-11 DIAGNOSIS — F332 Major depressive disorder, recurrent severe without psychotic features: Secondary | ICD-10-CM | POA: Diagnosis not present

## 2023-02-11 DIAGNOSIS — F4322 Adjustment disorder with anxiety: Secondary | ICD-10-CM | POA: Diagnosis not present
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
## 2023-02-16 ENCOUNTER — Other Ambulatory Visit (HOSPITAL_COMMUNITY): Payer: Self-pay

## 2023-02-17 ENCOUNTER — Other Ambulatory Visit (HOSPITAL_COMMUNITY): Payer: Self-pay

## 2023-02-17 ENCOUNTER — Ambulatory Visit (INDEPENDENT_AMBULATORY_CARE_PROVIDER_SITE_OTHER): Payer: BC Managed Care – PPO | Admitting: Psychology

## 2023-02-17 DIAGNOSIS — F332 Major depressive disorder, recurrent severe without psychotic features: Secondary | ICD-10-CM

## 2023-02-17 DIAGNOSIS — F339 Major depressive disorder, recurrent, unspecified: Secondary | ICD-10-CM
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
## 2023-02-18 ENCOUNTER — Other Ambulatory Visit: Payer: Self-pay

## 2023-02-18 ENCOUNTER — Other Ambulatory Visit (HOSPITAL_COMMUNITY): Payer: Self-pay

## 2023-02-18 ENCOUNTER — Telehealth: Payer: Self-pay

## 2023-02-18 NOTE — Telephone Encounter (Signed)
Message sent to Dr Clent Ridges for approval

## 2023-02-19 ENCOUNTER — Other Ambulatory Visit (HOSPITAL_COMMUNITY): Payer: Self-pay

## 2023-02-19 ENCOUNTER — Ambulatory Visit: Payer: BC Managed Care – PPO | Admitting: Psychology

## 2023-02-19 MED ORDER — HYDROMORPHONE HCL 8 MG PO TABS
8.0000 mg | ORAL_TABLET | Freq: Every day | ORAL | 0 refills | Status: AC
Start: 2023-02-19 — End: 2023-02-27
  Filled 2023-02-19: qty 7, 7d supply, fill #0

## 2023-02-19 NOTE — Addendum Note (Signed)
Addended by: Gershon Crane A on: 02/19/2023 07:36 AM   Modules accepted: Orders

## 2023-02-19 NOTE — Telephone Encounter (Signed)
Done for 7 days

## 2023-02-19 NOTE — Telephone Encounter (Signed)
Noted  

## 2023-02-20 ENCOUNTER — Other Ambulatory Visit (HOSPITAL_COMMUNITY): Payer: Self-pay

## 2023-02-22 ENCOUNTER — Other Ambulatory Visit: Payer: Self-pay | Admitting: Family Medicine

## 2023-02-22 ENCOUNTER — Other Ambulatory Visit (HOSPITAL_COMMUNITY): Payer: Self-pay

## 2023-02-23 ENCOUNTER — Other Ambulatory Visit: Payer: Self-pay

## 2023-02-24 ENCOUNTER — Other Ambulatory Visit: Payer: Self-pay

## 2023-02-24 ENCOUNTER — Ambulatory Visit: Payer: BC Managed Care – PPO | Admitting: Psychology

## 2023-02-24 ENCOUNTER — Other Ambulatory Visit (HOSPITAL_COMMUNITY): Payer: Self-pay

## 2023-02-24 DIAGNOSIS — F4322 Adjustment disorder with anxiety: Secondary | ICD-10-CM

## 2023-02-24 DIAGNOSIS — F339 Major depressive disorder, recurrent, unspecified: Secondary | ICD-10-CM | POA: Diagnosis not present

## 2023-02-24 MED ORDER — AMPHETAMINE-DEXTROAMPHET ER 30 MG PO CP24
30.0000 mg | ORAL_CAPSULE | Freq: Every day | ORAL | 0 refills | Status: DC
  Filled 2023-02-24 – 2023-03-25 (×2): qty 30, 30d supply, fill #0

## 2023-02-24 MED ORDER — AMPHETAMINE-DEXTROAMPHET ER 30 MG PO CP24
30.0000 mg | ORAL_CAPSULE | Freq: Every day | ORAL | 0 refills | Status: DC
  Filled 2023-02-24 – 2023-05-28 (×2): qty 30, 30d supply, fill #0

## 2023-02-24 MED ORDER — AMPHETAMINE-DEXTROAMPHET ER 30 MG PO CP24
30.0000 mg | ORAL_CAPSULE | Freq: Every day | ORAL | 0 refills | Status: DC
  Filled 2023-02-24 – 2023-04-26 (×2): qty 30, 30d supply, fill #0

## 2023-02-24 NOTE — Telephone Encounter (Signed)
Pt LOV was on 12/25/22 Last refill was done on 02/22/23 Pt requests refills to her pharmacy for next month Please advise
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
## 2023-02-24 NOTE — Telephone Encounter (Signed)
Done

## 2023-02-25 ENCOUNTER — Other Ambulatory Visit (HOSPITAL_COMMUNITY): Payer: Self-pay

## 2023-02-25 ENCOUNTER — Encounter: Payer: Self-pay | Admitting: Pharmacist

## 2023-02-25 ENCOUNTER — Other Ambulatory Visit: Payer: Self-pay

## 2023-02-25 DIAGNOSIS — N760 Acute vaginitis: Secondary | ICD-10-CM | POA: Diagnosis not present

## 2023-02-25 DIAGNOSIS — Z113 Encounter for screening for infections with a predominantly sexual mode of transmission: Secondary | ICD-10-CM | POA: Diagnosis not present

## 2023-02-25 DIAGNOSIS — Z01419 Encounter for gynecological examination (general) (routine) without abnormal findings: Secondary | ICD-10-CM | POA: Diagnosis not present

## 2023-02-25 DIAGNOSIS — Z6834 Body mass index (BMI) 34.0-34.9, adult: Secondary | ICD-10-CM | POA: Diagnosis not present

## 2023-02-25 DIAGNOSIS — Z1231 Encounter for screening mammogram for malignant neoplasm of breast: Secondary | ICD-10-CM | POA: Diagnosis not present

## 2023-02-26 ENCOUNTER — Other Ambulatory Visit: Payer: Self-pay

## 2023-02-26 ENCOUNTER — Other Ambulatory Visit: Payer: Self-pay | Admitting: Family Medicine

## 2023-02-26 NOTE — Telephone Encounter (Signed)
Pt LOV was  on 12/25/22 Last refill was done on 04/16/22 Please advise

## 2023-03-05 ENCOUNTER — Ambulatory Visit: Payer: BC Managed Care – PPO | Admitting: Psychology

## 2023-03-06 ENCOUNTER — Other Ambulatory Visit: Payer: Self-pay | Admitting: Family Medicine

## 2023-03-06 ENCOUNTER — Other Ambulatory Visit (HOSPITAL_COMMUNITY): Payer: Self-pay

## 2023-03-08 ENCOUNTER — Other Ambulatory Visit: Payer: Self-pay | Admitting: Family Medicine

## 2023-03-08 ENCOUNTER — Other Ambulatory Visit: Payer: Self-pay

## 2023-03-08 ENCOUNTER — Emergency Department (HOSPITAL_BASED_OUTPATIENT_CLINIC_OR_DEPARTMENT_OTHER): Payer: BC Managed Care – PPO

## 2023-03-08 ENCOUNTER — Other Ambulatory Visit (HOSPITAL_COMMUNITY): Payer: Self-pay

## 2023-03-08 ENCOUNTER — Emergency Department (HOSPITAL_BASED_OUTPATIENT_CLINIC_OR_DEPARTMENT_OTHER)
Admission: EM | Admit: 2023-03-08 | Discharge: 2023-03-08 | Disposition: A | Discharge status: Home / Self Care | Payer: BC Managed Care – PPO | Attending: Emergency Medicine | Admitting: Emergency Medicine

## 2023-03-08 ENCOUNTER — Encounter (HOSPITAL_BASED_OUTPATIENT_CLINIC_OR_DEPARTMENT_OTHER): Payer: Self-pay | Admitting: Emergency Medicine

## 2023-03-08 DIAGNOSIS — R0989 Other specified symptoms and signs involving the circulatory and respiratory systems: Secondary | ICD-10-CM | POA: Diagnosis not present

## 2023-03-08 DIAGNOSIS — R059 Cough, unspecified: Secondary | ICD-10-CM | POA: Diagnosis not present

## 2023-03-08 DIAGNOSIS — Z79899 Other long term (current) drug therapy: Secondary | ICD-10-CM | POA: Diagnosis not present

## 2023-03-08 DIAGNOSIS — Z8616 Personal history of COVID-19: Secondary | ICD-10-CM | POA: Diagnosis not present

## 2023-03-08 DIAGNOSIS — E876 Hypokalemia: Secondary | ICD-10-CM | POA: Diagnosis not present

## 2023-03-08 DIAGNOSIS — R0602 Shortness of breath: Secondary | ICD-10-CM | POA: Diagnosis not present

## 2023-03-08 DIAGNOSIS — U071 COVID-19: Secondary | ICD-10-CM | POA: Insufficient documentation

## 2023-03-08 LAB — BASIC METABOLIC PANEL
Anion gap: 9 (ref 5–15)
BUN: 13 mg/dL (ref 6–20)
CO2: 31 mmol/L (ref 22–32)
Calcium: 8.8 mg/dL — ABNORMAL LOW (ref 8.9–10.3)
Chloride: 99 mmol/L (ref 98–111)
Creatinine, Ser: 0.92 mg/dL (ref 0.44–1.00)
GFR, Estimated: >60 mL/min (ref >60)
Glucose, Bld: 97 mg/dL (ref 70–99)
Potassium: 2.8 mmol/L — ABNORMAL LOW (ref 3.5–5.1)
Sodium: 139 mmol/L (ref 135–145)

## 2023-03-08 LAB — RESP PANEL BY RT-PCR (RSV, FLU A&B, COVID)  RVPGX2
Influenza A by PCR: NEGATIVE
Influenza B by PCR: NEGATIVE
Resp Syncytial Virus by PCR: NEGATIVE
SARS Coronavirus 2 by RT PCR: POSITIVE — AB

## 2023-03-08 MED ORDER — ALBUTEROL SULFATE HFA 108 (90 BASE) MCG/ACT IN AERS
4.0000 | INHALATION_SPRAY | Freq: Once | RESPIRATORY_TRACT | Status: AC
  Administered 2023-03-08: 4 via RESPIRATORY_TRACT
  Filled 2023-03-08: qty 6.7

## 2023-03-08 MED ORDER — AEROCHAMBER PLUS FLO-VU LARGE MISC
1.0000 | Freq: Once | Status: DC
  Filled 2023-03-08: qty 1

## 2023-03-08 MED ORDER — PAXLOVID (300/100) 20 X 150 MG & 10 X 100MG PO TBPK
3.0000 | ORAL_TABLET | Freq: Two times a day (BID) | ORAL | 0 refills | Status: AC
  Filled 2023-03-08: qty 30, 5d supply, fill #0

## 2023-03-08 MED ORDER — IPRATROPIUM-ALBUTEROL 0.5-2.5 (3) MG/3ML IN SOLN
3.0000 mL | Freq: Once | RESPIRATORY_TRACT | Status: AC
  Administered 2023-03-08: 3 mL via RESPIRATORY_TRACT
  Filled 2023-03-08: qty 3

## 2023-03-08 MED ORDER — POTASSIUM CHLORIDE CRYS ER 20 MEQ PO TBCR
40.0000 meq | EXTENDED_RELEASE_TABLET | Freq: Every day | ORAL | 0 refills | Status: DC
  Filled 2023-03-08: qty 14, 7d supply, fill #0

## 2023-03-08 MED ORDER — DEXAMETHASONE 4 MG PO TABS
12.0000 mg | ORAL_TABLET | Freq: Once | ORAL | Status: AC
  Administered 2023-03-08: 12 mg via ORAL
  Filled 2023-03-08: qty 3

## 2023-03-08 NOTE — ED Notes (Addendum)
Patient reports body aches, dry cough, congestion, and chest discomfort when breathing. Patient reports she started running fever last night; last time taking Tylenol at 2330 last night. Patient reports going on a cruise, coming back yesterday. Patient speaking in full complete sentences, airway intact, nonlabored and even respirations.

## 2023-03-08 NOTE — ED Provider Notes (Signed)
[name] EMERGENCY DEPARTMENT AT [facility] Provider Note   CSN: [id number] Arrival date & time: 03/08/23  0208     History  Chief Complaint  Patient presents with   Generalized Body Aches    [name] is a 47 y.o. female.  48 year old female who presents ER today secondary to cough and shortness of breath and fever.  Patient states that she was on a cruise recently.  She flew down to [place] and then they flew back.  She states she had a dry cough on the plane but that is related to dry air.  She states that tonight she has had a persistent cough with a tight feeling in her chest which she states to take deep breath.  She states that she has had a fever she is only able to take Tylenol secondary to esophagitis.  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  No sick contacts.  Had COVID in 2021 which was pretty severe and left her with some type of pulmonary fibrosis.        Home Medications Prior to Admission medications   Medication Sig Start Date End Date Taking? Authorizing Provider  nirmatrelvir/ritonavir (PAXLOVID, 300/100,) 20 x 150 MG & 10 x 100MG  TBPK Take 3 tablets by mouth 2 (two) times daily for 5 days. Patient GFR is >60. Take nirmatrelvir (150 mg) two tablets twice daily for 5 days and ritonavir (100 mg) one tablet twice daily for 5 days. 03/08/23 03/13/23 Yes Kathie Posa, Barbara Cower, MD  potassium chloride SA (KLOR-CON M) 20 MEQ tablet Take 2 tablets (40 mEq total) by mouth daily for 7 days. 03/08/23 03/15/23 Yes [name], Barbara Cower, MD  ALPRAZolam [name]) 0.5 MG tablet TAKE 1 TABLET BY MOUTH THREE TIMES A DAY AS NEEDED 02/26/23   [name], MD  amphetamine-dextroamphetamine (ADDERALL XR) 30 MG 24 hr capsule Take 1 capsule (30 mg total) by mouth daily. 02/24/23   [name], MD  amphetamine-dextroamphetamine (ADDERALL XR) 30 MG 24 hr capsule Take 1 capsule (30 mg total) by mouth daily. 03/26/23   [name], MD   amphetamine-dextroamphetamine (ADDERALL XR) 30 MG 24 hr capsule Take 1 capsule (30 mg total) by mouth daily. 04/26/23   [name], MD  atorvastatin (LIPITOR) 20 MG tablet Take 1 tablet (20 mg total) by mouth daily. 11/07/21   [name], MD  B-D 3CC LUER-LOK SYR 25GX1" 25G X 1" 3 ML MISC USE AS DIRECTED 06/10/20   [name], MD  cetirizine (ZYRTEC) 10 MG tablet Take 1 tablet (10 mg total) by mouth daily. 09/04/22   [name], MD  clobetasol (TEMOVATE) 0.05 % external solution clobetasol 0.05 % scalp solution    [provider]  cyanocobalamin (VITAMIN B12) 1000 MCG/ML injection Inject 1 mL (1,000 mcg total) into the muscle once a week. 05/22/22   [name], MD  cyclobenzaprine (FLEXERIL) 10 MG tablet Take 1 tablet (10 mg total) by mouth 3 (three) times daily as needed for muscle spasms. 11/03/21   [name], MD  esomeprazole (NEXIUM) 40 MG capsule Take 1 capsule (40 mg total) by mouth daily. 01/20/23   [name], MD  famotidine (PEPCID) 40 MG tablet Take 1 tablet (40 mg total) by mouth at bedtime. 10/19/22   [name], MD  fluconazole (DIFLUCAN) 150 MG tablet TAKE 1 TABLET BY MOUTH EVERY DAY 01/18/23   [name], MD  Fluocinolone Acetonide  Body 0.01 % OIL fluocinolone 0.01 % scalp oil and shower cap    [provider]  fluticasone (FLONASE) 50 MCG/ACT nasal spray Place 2 sprays into both nostrils daily. 07/13/20   [name], [name], FNP  gabapentin (NEURONTIN) 300 MG capsule Take 1 capsule (300 mg total) by mouth 3 (three) times daily. 12/01/22   [name], MD  hydrochlorothiazide (HYDRODIURIL) 25 MG tablet Take 1 tablet (25 mg total) by mouth daily. 01/20/23   [name], MD  HYDROmorphone (DILAUDID) 8 MG tablet Take 1 tablet (8 mg) by mouth at bedtime. 02/25/23 02/25/23   [name], MD  lidocaine (LIDODERM) 5 % Place 1 patch onto the skin daily. Remove & Discard patch within 12 hours or as directed by MD 11/04/21   [name], MD   Multiple Vitamins-Minerals (MULTI-VITAMIN GUMMIES PO) Take 2 tablets by mouth daily.    [provider]  oxyCODONE (ROXICODONE) 15 MG immediate release tablet Take 1 tablet (15 mg) by mouth every 4 hours as needed for pain. 01/25/23   [name], MD  phenazopyridine (PYRIDIUM) 100 MG tablet Take 1 tablet (100 mg total) by mouth 3 (three) times daily as needed for pain. 09/05/22   [name], NP  potassium chloride (KLOR-CON 10) 10 MEQ tablet Take 1 tablet (10 mEq total) by mouth daily. 02/09/22   [name], MD  promethazine (PHENERGAN) 25 MG tablet Take 1 tablet (25 mg total) by mouth every 4 (four) hours as needed for nausea or vomiting. 03/20/22   [name], MD  Semaglutide-Weight Management (WEGOVY) 1.7 MG/0.75ML SOAJ Inject 1.7 mg into the skin once a week. 12/25/22   [name], MD  sucralfate (CARAFATE) 1 g tablet Take 1 tablet (1 g total) by mouth 4 (four) times daily. 06/08/19   [name], MD  sulfamethoxazole-trimethoprim (BACTRIM DS) 800-160 MG tablet Take 1 tablet by mouth 2 (two) times daily. 01/01/23   [name], PA-C  SUMAtriptan (IMITREX) 100 MG tablet Take 1 tablet earliest onset of migraine.  May repeat once in 2 hours if headache persists or recurs. 10/14/16   [name], DO  Vitamin D, Ergocalciferol, (DRISDOL) 1.25 MG (50000 UNIT) CAPS capsule Take 1 capsule by mouth every 7 days. 12/22/22   [name], MD      Allergies    Gadolinium derivatives, Glycopyrrolate, Keflex [cephalexin], Other, and Zoloft [sertraline hcl]    Review of Systems   Review of Systems  Physical Exam Updated Vital Signs BP 114/76   Pulse 90   Temp 97.9 F (36.6 C)   Resp 20   Ht 5\' 9"  (1.753 m)   Wt 106.6 kg   LMP 12/13/1999 Comment: single oophorectomy//a.c.  SpO2 100%   BMI 34.70 kg/m  Physical Exam Vitals and nursing note reviewed.  Constitutional:      Appearance: She is well-developed.  HENT:     Head: Normocephalic and atraumatic.   Eyes:     Pupils: Pupils are equal, round, and reactive to light.  Cardiovascular:     Rate and Rhythm: Normal rate and regular rhythm.  Pulmonary:     Effort: No respiratory distress.     Breath sounds: No stridor.     Comments: Diminished bilaterally without obvious wheezing Abdominal:     General: There is no distension.  Musculoskeletal:     Cervical back: Normal range of motion.  Skin:    General: Skin is warm and dry.  Neurological:     General: No focal deficit present.     Mental Status: She is alert.     ED Results / Procedures / Treatments   Labs (all labs ordered are listed, but only abnormal results are displayed) Labs Reviewed  RESP PANEL BY RT-PCR (RSV, FLU A&B, COVID)  [id number] - Abnormal; Notable for the following components:      Result Value   SARS Coronavirus 2 by RT PCR POSITIVE (*)    All other components within normal limits  BASIC METABOLIC PANEL - Abnormal; Notable for the following components:   Potassium 2.8 (*)    Calcium 8.8 (*)    All other components within normal limits    EKG None  Radiology DG Chest Portable 1 View  Result Date: 03/08/2023 CLINICAL DATA:  47 year old female with history of cough and shortness of breath. COVID positive patient. EXAM: PORTABLE CHEST 1 VIEW COMPARISON:  Chest x-ray 09/11/2019. FINDINGS: Lung volumes are low. No consolidative airspace disease. No pleural effusions. No pneumothorax. No pulmonary nodule or mass noted. Pulmonary vasculature and the cardiomediastinal silhouette are within normal limits. IMPRESSION: 1. Low lung volumes without radiographic evidence of acute cardiopulmonary disease. Electronically Signed   By: Trudie Reed M.D.   On: 03/08/2023 05:29    Procedures Procedures    Medications Ordered in ED Medications  AeroChamber Plus Flo-Vu Large MISC 1 each (has no administration in time range)  ipratropium-albuterol (DUONEB) 0.5-2.5 (3) MG/3ML nebulizer solution 3 mL (3 mLs Nebulization  Given 03/08/23 0434)  albuterol (VENTOLIN HFA) 108 (90 Base) MCG/ACT inhaler 4 puff (4 puffs Inhalation Given 03/08/23 0609)  dexamethasone (DECADRON) tablet 12 mg (12 mg Oral Given 03/08/23 0555)    ED Course/ Medical Decision Making/ A&P                                 Medical Decision Making Amount and/or Complexity of Data Reviewed Labs: ordered. Radiology: ordered.  Risk Prescription drug management.  COVID-positive with associated fever and bodyaches and generalized illness.  Will get x-ray to make sure there is no complicating features.  She has a history of a provoked PE but low suspicion that this is a PVD with normal vital signs and no other risk factors.  Her airplane ride was only about an hour and a half.  X-ray okay on my interpretation.  Radiology read reviewed.  Discussed with pharmacy and recommended Paxlovid as long as she does not take her statin.  She states she has never started the statin.  Kidney function checked and patient left prior to results.  Kidney function is fine but potassium is a bit low.  This may have been somewhat exacerbated by the breathing treatment before she left but send prescription for Paxlovid and potassium and informed her of the same.  Will return here for new or worsening symptoms otherwise follow-up with PCP to ensure improvement.   Final Clinical Impression(s) / ED Diagnoses Final diagnoses:  COVID  Hypokalemia    Rx / DC Orders ED Discharge Orders          Ordered    nirmatrelvir/ritonavir (PAXLOVID, 300/100,) 20 x 150 MG & 10 x 100MG  TBPK  2 times daily        03/08/23 0647    potassium chloride SA (KLOR-CON M) 20 MEQ tablet  Daily        03/08/23 0647  Marily Memos, MD 03/08/23 [phone]

## 2023-03-08 NOTE — Discharge Instructions (Addendum)
I will send in your Paxlovid prescription when I see your renal function. I suspect it will be normal as it almost always is but need to verify.

## 2023-03-08 NOTE — ED Notes (Signed)
RT educated pt on proper use of MDI w/spacer. Pt able to perform without difficulty. Pt verbalizes understanding.    03/08/23 0609  Aerosol Therapy Tx  $ Hand Held Nebulizer  1  Medications Albuterol  Delivery Device MDI (w/spacer)  Pre-Treatment Pulse 101  Pre-Treatment Respirations 18  Treatment Tolerance Tolerated well  Treatment Given 1  MEWS Score/Color  MEWS Score 0  MEWS Score Color Green  RT Breath Sounds  Bilateral Breath Sounds Clear;Diminished  R Upper  Breath Sounds Clear  L Upper Breath Sounds Clear  R Lower Breath Sounds Diminished  L Lower Breath Sounds Diminished  Oxygen Therapy/Pulse Ox  O2 Device Room Air  O2 Therapy Room air

## 2023-03-08 NOTE — ED Triage Notes (Signed)
Pt presents with dry cough since returning from cruise yesterday.  Hurts to breathe.  Headache.  Body aches.  Temp 102 at home.  Runny nose.

## 2023-03-09 ENCOUNTER — Telehealth: Payer: BC Managed Care – PPO | Admitting: Family Medicine

## 2023-03-09 ENCOUNTER — Other Ambulatory Visit: Payer: Self-pay

## 2023-03-09 ENCOUNTER — Encounter: Payer: Self-pay | Admitting: Family Medicine

## 2023-03-09 ENCOUNTER — Other Ambulatory Visit (HOSPITAL_COMMUNITY): Payer: Self-pay

## 2023-03-09 DIAGNOSIS — M48061 Spinal stenosis, lumbar region without neurogenic claudication: Secondary | ICD-10-CM

## 2023-03-09 DIAGNOSIS — F1190 Opioid use, unspecified, uncomplicated: Secondary | ICD-10-CM | POA: Diagnosis not present

## 2023-03-09 MED ORDER — HYDROMORPHONE HCL 8 MG PO TABS
8.0000 mg | ORAL_TABLET | Freq: Every day | ORAL | 0 refills | Status: DC
  Filled 2023-03-09 – 2023-05-28 (×3): qty 30, 30d supply, fill #0

## 2023-03-09 MED ORDER — OXYCODONE HCL 15 MG PO TABS
15.0000 mg | ORAL_TABLET | ORAL | 0 refills | Status: DC | PRN
  Filled 2023-03-09 – 2023-04-13 (×2): qty 180, 30d supply, fill #0

## 2023-03-09 MED ORDER — OXYCODONE HCL 15 MG PO TABS
15.0000 mg | ORAL_TABLET | ORAL | 0 refills | Status: DC | PRN
  Filled 2023-03-09 – 2023-05-12 (×2): qty 180, 30d supply, fill #0

## 2023-03-09 MED ORDER — OXYCODONE HCL 15 MG PO TABS
15.0000 mg | ORAL_TABLET | ORAL | 0 refills | Status: DC | PRN
  Filled 2023-03-09 – 2023-03-12 (×2): qty 180, 30d supply, fill #0

## 2023-03-09 MED ORDER — HYDROMORPHONE HCL 8 MG PO TABS
8.0000 mg | ORAL_TABLET | Freq: Every day | ORAL | 0 refills | Status: DC
  Filled 2023-03-09 – 2023-03-12 (×2): qty 30, 30d supply, fill #0

## 2023-03-09 MED ORDER — HYDROMORPHONE HCL 8 MG PO TABS
8.0000 mg | ORAL_TABLET | Freq: Every day | ORAL | 0 refills | Status: DC
  Filled 2023-03-09 – 2023-04-26 (×2): qty 30, 30d supply, fill #0

## 2023-03-09 NOTE — Progress Notes (Signed)
Subjective:    Patient ID: [name], female    DOB: April 17, 1976, 47 y.o.   MRN: [id number]  HPI Virtual Visit via Video Note  I connected with the patient on 03/09/23 at  1:30 PM EDT by a video enabled telemedicine application and verified that I am speaking with the correct person using two identifiers.  Location patient: home Location provider:work or home office Persons participating in the virtual visit: patient, provider  I discussed the limitations of evaluation and management by telemedicine and the availability of in person appointments. The patient expressed understanding and agreed to proceed.   HPI: Here for pain management. She is doing about the same.    ROS: See pertinent positives and negatives per HPI.  Past Medical History:  Diagnosis Date   Allergy    Anxiety    Arthritis    B12 deficiency    Chronic narcotic use    Depression    not currently    Dysrhythmia    hx tachy-was from medication nortriptylline   Esophagitis    rx   Fibromyalgia    GERD (gastroesophageal reflux disease)    History of echocardiogram    Echo 4/18: Mild concentric LVH, vigorous LVF, EF 65-70, normal wall motion, grade 1 diastolic dysfunction   History of kidney stones    Hyperlipidemia    Intervertebral disc protrusion 01/11/2014   Migraine    Obesity (BMI 35.0-39.9 without comorbidity)    Peripheral neuropathy    Polycystic ovaries    Pulmonary embolus (HCC)    Spinal stenosis, lumbar    Vitamin D deficiency     Past Surgical History:  Procedure Laterality Date   67 HOUR PH STUDY N/A 05/27/2016   Procedure: 24 HOUR PH STUDY;  Surgeon: Ruffin Frederick, MD;  Location: [facility];  Service: Gastroenterology;  Laterality: N/A;   ABDOMINAL EXPOSURE N/A 05/04/2018   Procedure: ABDOMINAL EXPOSURE;  Surgeon: Larina Earthly, MD;  Location: [facility] OR;  Service: Vascular;  Laterality: N/A;   ANTERIOR LUMBAR FUSION N/A 05/04/2018   Procedure: Lumbar Five Sacral One  Anterior lumbar interbody fusion;  Surgeon: Donalee Citrin, MD;  Location: [facility] OR;  Service: Neurosurgery;  Laterality: N/A;  Lumbar Five Sacral One Anterior lumbar interbody fusion   APPENDECTOMY     BACK SURGERY  2012   left laminectomy at L3-4 per Dr. Phoebe Perch    bladder tack  2012   CHOLECYSTECTOMY N/A 10/03/2015   Procedure: LAPAROSCOPIC CHOLECYSTECTOMY;  Surgeon: Rodman Pickle, MD;  Location: [facility] OR;  Service: General;  Laterality: N/A;   ESOPHAGEAL MANOMETRY N/A 05/27/2016   Procedure: ESOPHAGEAL MANOMETRY (EM);  Surgeon: Ruffin Frederick, MD;  Location: [facility];  Service: Gastroenterology;  Laterality: N/A;   EXCISION OF SKIN TAG  10/03/2015   Procedure: EXCISION OF SKIN TAG;  Surgeon: De Blanch Kinsinger, MD;  Location: [facility] OR;  Service: General;;   LUMBAR DISC SURGERY  01-31-14   per Dr. [name]    OOPHORECTOMY     left overy   OVARIAN CYST REMOVAL     RECTOCELE REPAIR     SINUSOTOMY  12/14/06   spinal fusion  2016/10   TONSILLECTOMY     TONSILLECTOMY     VAGINAL HYSTERECTOMY     WRIST GANGLION EXCISION Left     Family History  Problem Relation Age of Onset   Fibromyalgia Mother    Diabetes Mother    Thyroid cancer Mother    Liver disease Mother  Neuropathy Mother    Cirrhosis Mother        non-alcoholic   Pulmonary embolism Mother        both lungs   Heart failure Mother    Hypertension Father    Diabetes Father    Heart disease Father    Prostate cancer Father    Heart attack Father        x 2   Colon cancer Neg Hx    Other Neg Hx        pheochromocytoma   Colon polyps Neg Hx      Current Outpatient Medications:    ALPRAZolam (XANAX) 0.5 MG tablet, TAKE 1 TABLET BY MOUTH THREE TIMES A DAY AS NEEDED, Disp: 90 tablet, Rfl: 5   amphetamine-dextroamphetamine (ADDERALL XR) 30 MG 24 hr capsule, Take 1 capsule (30 mg total) by mouth daily., Disp: 30 capsule, Rfl: 0   [START ON 03/26/2023] amphetamine-dextroamphetamine (ADDERALL XR) 30 MG 24 hr capsule, Take  1 capsule (30 mg total) by mouth daily., Disp: 30 capsule, Rfl: 0   [START ON 04/26/2023] amphetamine-dextroamphetamine (ADDERALL XR) 30 MG 24 hr capsule, Take 1 capsule (30 mg total) by mouth daily., Disp: 30 capsule, Rfl: 0   atorvastatin (LIPITOR) 20 MG tablet, Take 1 tablet (20 mg total) by mouth daily., Disp: 90 tablet, Rfl: 3   B-D 3CC LUER-LOK SYR 25GX1" 25G X 1" 3 ML MISC, USE AS DIRECTED, Disp: 9 each, Rfl: 2   cetirizine (ZYRTEC) 10 MG tablet, Take 1 tablet (10 mg total) by mouth daily., Disp: 90 tablet, Rfl: 3   clobetasol (TEMOVATE) 0.05 % external solution, clobetasol 0.05 % scalp solution, Disp: , Rfl:    cyanocobalamin (VITAMIN B12) 1000 MCG/ML injection, Inject 1 mL (1,000 mcg total) into the muscle once a week., Disp: 30 mL, Rfl: 11   cyclobenzaprine (FLEXERIL) 10 MG tablet, Take 1 tablet (10 mg total) by mouth 3 (three) times daily as needed for muscle spasms., Disp: 90 tablet, Rfl: 5   esomeprazole (NEXIUM) 40 MG capsule, Take 1 capsule (40 mg total) by mouth daily., Disp: 90 capsule, Rfl: 0   famotidine (PEPCID) 40 MG tablet, Take 1 tablet (40 mg total) by mouth at bedtime., Disp: 90 tablet, Rfl: 1   fluconazole (DIFLUCAN) 150 MG tablet, TAKE 1 TABLET BY MOUTH EVERY DAY, Disp: 5 tablet, Rfl: 5   Fluocinolone Acetonide Body 0.01 % OIL, fluocinolone 0.01 % scalp oil and shower cap, Disp: , Rfl:    fluticasone (FLONASE) 50 MCG/ACT nasal spray, Place 2 sprays into both nostrils daily., Disp: 16 g, Rfl: 6   gabapentin (NEURONTIN) 300 MG capsule, Take 1 capsule (300 mg total) by mouth 3 (three) times daily., Disp: 270 capsule, Rfl: 1   hydrochlorothiazide (HYDRODIURIL) 25 MG tablet, Take 1 tablet (25 mg total) by mouth daily., Disp: 90 tablet, Rfl: 0   HYDROmorphone (DILAUDID) 8 MG tablet, Take 1 tablet (8 mg) by mouth at bedtime. 02/25/23, Disp: 30 tablet, Rfl: 0   lidocaine (LIDODERM) 5 %, Place 1 patch onto the skin daily. Remove & Discard patch within 12 hours or as directed by MD,  Disp: 30 patch, Rfl: 5   Multiple Vitamins-Minerals (MULTI-VITAMIN GUMMIES PO), Take 2 tablets by mouth daily., Disp: , Rfl:    nirmatrelvir/ritonavir (PAXLOVID, 300/100,) 20 x 150 MG & 10 x 100MG  TBPK, Take 3 tablets by mouth 2 (two) times daily for 5 days. Patient GFR is >60. Take nirmatrelvir (150 mg) two tablets twice daily for 5 days  and ritonavir (100 mg) one tablet twice daily for 5 days., Disp: 30 tablet, Rfl: 0   oxyCODONE (ROXICODONE) 15 MG immediate release tablet, Take 1 tablet (15 mg) by mouth every 4 hours as needed for pain., Disp: 180 tablet, Rfl: 0   phenazopyridine (PYRIDIUM) 100 MG tablet, Take 1 tablet (100 mg total) by mouth 3 (three) times daily as needed for pain., Disp: 10 tablet, Rfl: 0   potassium chloride (KLOR-CON 10) 10 MEQ tablet, Take 1 tablet (10 mEq total) by mouth daily., Disp: 90 tablet, Rfl: 3   potassium chloride SA (KLOR-CON M) 20 MEQ tablet, Take 2 tablets (40 mEq total) by mouth daily for 7 days., Disp: 14 tablet, Rfl: 0   promethazine (PHENERGAN) 25 MG tablet, Take 1 tablet (25 mg total) by mouth every 4 (four) hours as needed for nausea or vomiting., Disp: 90 tablet, Rfl: 5   Semaglutide-Weight Management (WEGOVY) 1.7 MG/0.75ML SOAJ, Inject 1.7 mg into the skin once a week., Disp: 3 mL, Rfl: 5   sucralfate (CARAFATE) 1 g tablet, Take 1 tablet (1 g total) by mouth 4 (four) times daily., Disp: 120 tablet, Rfl: 2   sulfamethoxazole-trimethoprim (BACTRIM DS) 800-160 MG tablet, Take 1 tablet by mouth 2 (two) times daily., Disp: 10 tablet, Rfl: 0   SUMAtriptan (IMITREX) 100 MG tablet, Take 1 tablet earliest onset of migraine.  May repeat once in 2 hours if headache persists or recurs., Disp: 10 tablet, Rfl: 2   Vitamin D, Ergocalciferol, (DRISDOL) 1.25 MG (50000 UNIT) CAPS capsule, Take 1 capsule by mouth every 7 days., Disp: 12 capsule, Rfl: 3  EXAM:  VITALS per patient if applicable:  GENERAL: alert, oriented, appears well and in no acute distress  HEENT:  atraumatic, conjunttiva clear, no obvious abnormalities on inspection of external nose and ears  NECK: normal movements of the head and neck  LUNGS: on inspection no signs of respiratory distress, breathing rate appears normal, no obvious gross SOB, gasping or wheezing  CV: no obvious cyanosis  MS: moves all visible extremities without noticeable abnormality  PSYCH/NEURO: pleasant and cooperative, no obvious depression or anxiety, speech and thought processing grossly intact  ASSESSMENT AND PLAN: Pain management. Indication for chronic opioid: low back pain Medication and dose: Oxycodone 15 mg and Hydromorphone 8 mg # pills per month: 180 and 30 Last UDS date: 08-07-22 Opioid Treatment Agreement signed (Y/N): 04-17-19 Opioid Treatment Agreement last reviewed with patient:  03-09-23 [facility] reviewed this encounter (include red flags): Yes Meds were refilled.  Gershon Crane, MD  Discussed the following assessment and plan:  No diagnosis found.     I discussed the assessment and treatment plan with the patient. The patient was provided an opportunity to ask questions and all were answered. The patient agreed with the plan and demonstrated an understanding of the instructions.   The patient was advised to call back or seek an in-person evaluation if the symptoms worsen or if the condition fails to improve as anticipated.      Review of Systems     Objective:   Physical Exam        Assessment & Plan:

## 2023-03-10 ENCOUNTER — Ambulatory Visit (INDEPENDENT_AMBULATORY_CARE_PROVIDER_SITE_OTHER): Payer: BC Managed Care – PPO | Admitting: Psychology

## 2023-03-10 DIAGNOSIS — F339 Major depressive disorder, recurrent, unspecified: Secondary | ICD-10-CM | POA: Diagnosis not present

## 2023-03-10 NOTE — Progress Notes (Signed)
Date: 03/10/2023  Treatment Plan Diagnosis 296.33 (Major depressive affective disorder, recurrent episode, severe, without mention of psychotic behavior) [n/a]  F43.22 (Adjustment Disorder, With anxiety) [n/a]  Symptoms One partner engages in sexual behavior (e.g., penile-vaginal intercourse, oral sex, anal sex) that violates the explicit or implicit expectations of the relationship. (Status: maintained) -- No Description Entered  Medication Status compliance  Safety none  If Suicidal or Homicidal State Action Taken: unspecified  Current Risk: low Medications unspecified Objectives Related Problem: Partners verbally express empathy toward each other and demonstrate shared responsibility for reconstructing the relationship. Description: Clarify what level of trust exists for various aspects of the relationship. Target Date: 2023-05-28 Frequency: Daily Modality: individual Progress: 75%  Related Problem: Partners verbally express empathy toward each other and demonstrate shared responsibility for reconstructing the relationship. Description: List relationship issues that contribute to dissatisfaction and unhappiness and agree to address these in future conjoint sessions. Target Date: 2023-05-28 Frequency: Daily Modality: individual Progress: 70%  Related Problem: Partners verbally express empathy toward each other and demonstrate shared responsibility for reconstructing the relationship. Description: Identify and list behavioral changes for self and partner that would enhance the relationship. Target Date: 2023-05-28 Frequency: Daily Modality: individual Progress: 75%  Related Problem: Partners verbally express empathy  toward each other and demonstrate shared responsibility for reconstructing the relationship. Description: Describe pre-affair relationship functioning. Target Date: 2023-05-28 Frequency: Daily Modality: individual Progress: 80%  Related Problem: Partners verbally express empathy toward each other and demonstrate shared responsibility for reconstructing the relationship. Description: Ask the partners to generate factors that they believe contributed to the affair. Target Date: 2023-05-28 Frequency: Daily Modality: individual Progress: 80%   Problem: Partners verbally express empathy toward each other and demonstrate shared responsibility for reconstructing the relationship. Description: Hurt partner uses anxiety-management techniques to deal with intrusive thoughts about the affair. Target Date: 2023-05-28 Frequency: Daily Modality: individual Progress: 55%  Client Response full compliance  Service Location Location, [address] [place], [place] [address]  Service Code cpt [phone]  Self care activities  Provide education, information  Emotion regulation skills  Validate/empathize  Identify/label emotions  Facilitate problem solving  Comments  Patient agrees to a video (Caregility) session and understands the limits of this platform. Patient is at home and provider is in home office.   Dx.: Depression  Meds: Xanax. Tried antidep. meds, but she has significant side-effects. Goals: They are seeking counseling to resolve significant marital conflict secondary to infidelity. [name] has experienced symptoms of both anxiety and depression during this period and is hoping to reduce these symptoms as the relationship issues improve. [name] is experiencing some depressive symptoms as well, along with considerable anger and frustration. Goal date 12-24   [name] and [name]: They  went on cruise together and she got Covid 19 when she got home. Talked about how they decided to go to the  cruise together. She states she was planning to go without [name], but he decided to go. Asked [name] about how she will feel secure and she says she needs him to be sincere in apology and to express loving feelings toward. He states he does not apologize because "it will never be right". Talked with him about the need to share his feelings in spite of his fear that his words will precipitate further arguing. She maintains that I am encouraging lies because she is convinced he lied about the last contact with his girlfriend and he maintains he was not with her. She is angry that I do not validate her "truth". We need to decide on next step to move beyond impasse.             [name], Ph.D.  Time 5:10p-6:00p 50 minutes

## 2023-03-12 ENCOUNTER — Other Ambulatory Visit (HOSPITAL_COMMUNITY): Payer: Self-pay

## 2023-03-13 ENCOUNTER — Other Ambulatory Visit (HOSPITAL_COMMUNITY): Payer: Self-pay

## 2023-03-17 ENCOUNTER — Telehealth: Payer: Self-pay

## 2023-03-17 NOTE — Telephone Encounter (Signed)
Pharmacy Patient Advocate Encounter  Received notification from [employer] that Prior Authorization for [facility] 1.7MG /0.75ML auto-injectors has been DENIED.  See denial reason below. No denial letter attached in CMM. Will attache denial letter to Media tab once received.   Message from [employer]: Drug is not covered by plan

## 2023-03-18 ENCOUNTER — Ambulatory Visit (INDEPENDENT_AMBULATORY_CARE_PROVIDER_SITE_OTHER): Payer: BC Managed Care – PPO | Admitting: Psychology

## 2023-03-18 DIAGNOSIS — F339 Major depressive disorder, recurrent, unspecified: Secondary | ICD-10-CM

## 2023-03-18 DIAGNOSIS — F4322 Adjustment disorder with anxiety: Secondary | ICD-10-CM | POA: Diagnosis not present

## 2023-03-18 DIAGNOSIS — F332 Major depressive disorder, recurrent severe without psychotic features: Secondary | ICD-10-CM | POA: Diagnosis not present

## 2023-03-18 NOTE — Progress Notes (Signed)
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 (Status: maintained) -- No Description Entered  Medication Status compliance  Safety none  If Suicidal or Homicidal State Action Taken: unspecified  Current Risk: low Medications unspecified Objectives Related Problem: Partners verbally express empathy toward each other and demonstrate shared responsibility for reconstructing the relationship. Description: Clarify what level of trust exists for various aspects of the relationship. Target Date: 2023-05-28 Frequency: Daily Modality: individual Progress: 75%  Related Problem: Partners verbally express empathy toward each other and demonstrate shared responsibility for reconstructing the relationship. Description: List relationship issues that contribute to dissatisfaction and unhappiness and agree to address these in future conjoint sessions. Target Date: 2023-05-28 Frequency: Daily Modality: individual Progress: 70%  Related Problem: Partners verbally express empathy toward each other and demonstrate shared responsibility for reconstructing the relationship. Description: Identify and list behavioral changes for self and partner that would enhance the relationship. Target Date: 2023-05-28 Frequency: Daily Modality: individual Progress: 75%  Related Problem:  Partners verbally express empathy toward each other and demonstrate shared responsibility for reconstructing the relationship. Description: Describe pre-affair relationship functioning. Target Date: 2023-05-28 Frequency: Daily Modality: individual Progress: 80%  Related Problem: Partners verbally express empathy toward each other and demonstrate shared responsibility for reconstructing the relationship. Description: Ask the partners to generate factors that they believe contributed to the affair. Target Date: 2023-05-28 Frequency: Daily Modality: individual Progress: 80%   Problem: Partners verbally express empathy toward each other and demonstrate shared responsibility for reconstructing the relationship. Description: Hurt partner uses anxiety-management techniques to deal with intrusive thoughts about the affair. Target Date: 2023-05-28 Frequency: Daily Modality: individual Progress: 55%  Client Response full compliance  Service Location Location, [address] [place], [place] [address]  Service Code cpt [phone]  Self care activities  Provide education, information  Emotion regulation skills  Validate/empathize  Identify/label emotions  Facilitate problem solving  Comments  Patient agrees to a video (Caregility) session and understands the limits of this platform. Patient is at home and provider is in home office.   Dx.: Depression  Meds: Xanax. Tried antidep. meds, but she has significant side-effects. Goals: They are seeking counseling to resolve significant marital conflict secondary to infidelity. [name] has experienced symptoms of both anxiety and depression during this period and is hoping to reduce these symptoms as the relationship issues improve. [name] is experiencing some depressive symptoms as well,  along with considerable anger and frustration. Goal date 12-24   [name] and [name]: We followed up from last session and discussed the lack of trust she has of [name].  She feels that fundamentally, he will not admit that he had contact with the "ex-girlfriend". He is steadfast in his denial. She says that she can't live with the mistrust she has with [name]. He says that he had hoped things would work out but now understands that it will not. He says his trigger is that he is thinking things are better and [name] finds something to be upset about.  Just found out son has gotten another woman pregnant. This is third child with 3 different woman.                [name], Ph.D.  Time 5:10p-6:00p 50 minutes

## 2023-03-19 ENCOUNTER — Ambulatory Visit: Payer: BC Managed Care – PPO | Admitting: Psychology

## 2023-03-22 ENCOUNTER — Other Ambulatory Visit (HOSPITAL_COMMUNITY): Payer: Self-pay

## 2023-03-22 ENCOUNTER — Encounter: Payer: Self-pay | Admitting: Family Medicine

## 2023-03-22 MED ORDER — WEGOVY 2.4 MG/0.75ML ~~LOC~~ SOAJ
2.4000 mg | SUBCUTANEOUS | 5 refills | Status: DC
Start: 2023-03-22 — End: 2023-09-06
  Filled 2023-03-22 – 2023-04-03 (×3): qty 3, 28d supply, fill #0
  Filled 2023-05-11: qty 3, 28d supply, fill #1
  Filled 2023-06-10 – 2023-06-21 (×4): qty 3, 28d supply, fill #2
  Filled 2023-07-16: qty 3, 28d supply, fill #3
  Filled 2023-08-20 (×2): qty 3, 28d supply, fill #4

## 2023-03-22 NOTE — Telephone Encounter (Signed)
I sent in for the 2.4 mg a week dose (this is the maximum dosage)

## 2023-03-23 ENCOUNTER — Other Ambulatory Visit (HOSPITAL_COMMUNITY): Payer: Self-pay

## 2023-03-23 ENCOUNTER — Other Ambulatory Visit: Payer: Self-pay

## 2023-03-24 ENCOUNTER — Other Ambulatory Visit: Payer: Self-pay

## 2023-03-24 ENCOUNTER — Ambulatory Visit (INDEPENDENT_AMBULATORY_CARE_PROVIDER_SITE_OTHER): Payer: BC Managed Care – PPO | Admitting: Psychology

## 2023-03-24 DIAGNOSIS — F339 Major depressive disorder, recurrent, unspecified: Secondary | ICD-10-CM

## 2023-03-24 DIAGNOSIS — F4322 Adjustment disorder with anxiety: Secondary | ICD-10-CM

## 2023-03-24 DIAGNOSIS — F332 Major depressive disorder, recurrent severe without psychotic features: Secondary | ICD-10-CM

## 2023-03-24 DIAGNOSIS — Z6833 Body mass index (BMI) 33.0-33.9, adult: Secondary | ICD-10-CM | POA: Diagnosis not present

## 2023-03-24 DIAGNOSIS — Z9889 Other specified postprocedural states: Secondary | ICD-10-CM | POA: Diagnosis not present

## 2023-03-24 DIAGNOSIS — G5771 Causalgia of right lower limb: Secondary | ICD-10-CM | POA: Diagnosis not present

## 2023-03-24 NOTE — Progress Notes (Signed)
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 (Status: maintained) -- No Description Entered  Medication Status compliance  Safety none  If Suicidal or Homicidal State Action Taken: unspecified  Current Risk: low Medications unspecified Objectives Related Problem: Partners verbally express empathy toward each other and demonstrate shared responsibility for reconstructing the relationship. Description: Clarify what level of trust exists for various aspects of the relationship. Target Date: 2023-05-28 Frequency: Daily Modality: individual Progress: 75%  Related Problem: Partners verbally express empathy toward each other and demonstrate shared responsibility for reconstructing the relationship. Description: List relationship issues that contribute to dissatisfaction and unhappiness and agree to address these in future conjoint sessions. Target Date: 2023-05-28 Frequency: Daily Modality: individual Progress: 70%  Related Problem: Partners verbally express empathy toward each other and demonstrate shared responsibility for reconstructing the relationship. Description: Identify and list behavioral changes for self and partner that would enhance the relationship. Target Date: 2023-05-28 Frequency: Daily Modality: individual Progress: 75%  Related Problem:  Partners verbally express empathy toward each other and demonstrate shared responsibility for reconstructing the relationship. Description: Describe pre-affair relationship functioning. Target Date: 2023-05-28 Frequency: Daily Modality: individual Progress: 80%  Related Problem: Partners verbally express empathy toward each other and demonstrate shared responsibility for reconstructing the relationship. Description: Ask the partners to generate factors that they believe contributed to the affair. Target Date: 2023-05-28 Frequency: Daily Modality: individual Progress: 80%   Problem: Partners verbally express empathy toward each other and demonstrate shared responsibility for reconstructing the relationship. Description: Hurt partner uses anxiety-management techniques to deal with intrusive thoughts about the affair. Target Date: 2023-05-28 Frequency: Daily Modality: individual Progress: 55%  Client Response full compliance  Service Location Location, [address] [place], [place] [address]  Service Code cpt [phone]  Self care activities  Provide education, information  Emotion regulation skills  Validate/empathize  Identify/label emotions  Facilitate problem solving  Comments  Patient agrees to a video (Caregility) session and understands the limits of this platform. Patient is at home and provider is in home office.   Dx.: Depression  Meds: Xanax. Tried antidep. meds, but she has significant side-effects. Goals: They are seeking counseling to resolve significant marital conflict secondary to infidelity. [name] has experienced symptoms of both anxiety and depression during this period and is hoping to reduce these symptoms as the relationship issues improve. [name] is experiencing some depressive symptoms as well,  along with considerable anger and frustration. Goal date 12-24   [name] and [name]: He states that he is applying for a new job in same company which pays higher and  entails a move. Not sure how he feels about it.  In response to the question if she can envision being happy with [name], she struggles to answer due to the lack of trust. She says that [name]'s happiness lies within having money. He does not disagree. Stepfanie says the only way she can trust [name] again is if he admits to recent communication with the woman he was seeing. He says she will never trust him no matter what he does. She is angry that [name] shows no sympathy for what he has "done to her". He doesn't believe there is anyway  her anger and distrust will go away. Talked about the decision of staying together or breaking up.                       [name], Ph.D.  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 (Status: maintained) -- No Description Entered  Medication Status compliance  Safety none  If Suicidal or Homicidal State Action Taken: unspecified  Current Risk: low Medications unspecified Objectives Related Problem: Partners verbally express empathy toward each other and demonstrate shared responsibility for reconstructing the relationship. Description: Clarify what level of trust exists for various aspects of the relationship. Target Date: 2023-05-28 Frequency: Daily Modality:  individual Progress: 75%  Related Problem: Partners verbally express empathy toward each other and demonstrate shared responsibility for reconstructing the relationship. Description: List relationship issues that contribute to dissatisfaction and unhappiness and agree to address these in future conjoint sessions. Target Date: 2023-05-28 Frequency: Daily Modality: individual Progress: 70%  Related Problem: Partners verbally express empathy toward each other and demonstrate shared responsibility for reconstructing the relationship. Description: Identify and list behavioral changes for self and partner that would enhance the relationship. Target Date: 2023-05-28 Frequency: Daily Modality: individual Progress: 75%  Related Problem: Partners verbally express empathy toward each other and demonstrate shared responsibility for reconstructing the relationship. Description: Describe pre-affair relationship functioning. Target Date: 2023-05-28 Frequency: Daily Modality: individual Progress: 80%  Related Problem: Partners verbally express empathy toward each other and demonstrate shared responsibility  for reconstructing the relationship. Description: Ask the partners to generate factors that they believe contributed to the affair. Target Date: 2023-05-28 Frequency: Daily Modality: individual Progress: 80%   Problem: Partners verbally express empathy toward each other and demonstrate shared responsibility for reconstructing the relationship. Description: Hurt partner uses anxiety-management techniques to deal with intrusive thoughts about the affair. Target Date: 2023-05-28 Frequency: Daily Modality: individual Progress: 55%  Client Response full compliance  Service Location Location, [address] [place], [place] [address]  Service Code cpt [phone]  Self care activities  Provide education, information  Emotion regulation skills  Validate/empathize  Identify/label emotions   Facilitate problem solving  Comments  Patient agrees to a video (Caregility) session and understands the limits of this platform. Patient is at home and provider is in home office.   Dx.: Depression  Meds: Xanax. Tried antidep. meds, but she has significant side-effects. Goals: They are seeking counseling to resolve significant marital conflict secondary to infidelity. [name] has experienced symptoms of both anxiety and depression during this period and is hoping to reduce these symptoms as the relationship issues improve. [name] is experiencing some depressive symptoms as well, along with considerable anger and frustration. Goal date 12-24   [name] and [name]: We followed up from last session and discussed the lack of trust she has of [name]. She feels that fundamentally, he will not admit that he had contact with the "ex-girlfriend". He is steadfast in his denial. She says that she can't live with the mistrust she has with [name]. He says that he had hoped things would work out but now understands that it will not. He says his trigger is that he is thinking things are better and [name] finds something to be upset about.  Just found out son has gotten another woman pregnant. This is third child with 3 different woman.                [name], Ph.D.  Time 5:10p-6:00p 50 minutes

## 2023-03-25 ENCOUNTER — Other Ambulatory Visit (HOSPITAL_COMMUNITY): Payer: Self-pay

## 2023-03-25 ENCOUNTER — Other Ambulatory Visit: Payer: Self-pay

## 2023-03-26 ENCOUNTER — Other Ambulatory Visit (HOSPITAL_COMMUNITY): Payer: Self-pay

## 2023-03-29 ENCOUNTER — Other Ambulatory Visit (HOSPITAL_COMMUNITY): Payer: Self-pay

## 2023-03-29 DIAGNOSIS — N904 Leukoplakia of vulva: Secondary | ICD-10-CM | POA: Diagnosis not present

## 2023-03-29 DIAGNOSIS — N9089 Other specified noninflammatory disorders of vulva and perineum: Secondary | ICD-10-CM | POA: Diagnosis not present

## 2023-04-01 ENCOUNTER — Other Ambulatory Visit: Payer: Self-pay

## 2023-04-02 ENCOUNTER — Telehealth: Payer: Self-pay

## 2023-04-02 ENCOUNTER — Ambulatory Visit: Payer: BC Managed Care – PPO | Admitting: Psychology

## 2023-04-02 ENCOUNTER — Other Ambulatory Visit (HOSPITAL_COMMUNITY): Payer: Self-pay

## 2023-04-02 NOTE — Progress Notes (Unsigned)
Date: 04/02/2023  Treatment Plan Diagnosis 296.33 (Major depressive affective disorder, recurrent episode, severe, without mention of psychotic behavior) [n/a]  F43.22 (Adjustment Disorder, With anxiety) [n/a]  Symptoms One partner engages in sexual behavior (e.g., penile-vaginal intercourse, oral sex, anal sex) that violates the explicit or implicit expectations of the relationship. (Status: maintained) -- No Description Entered  Medication Status compliance  Safety none  If Suicidal or Homicidal State Action Taken: unspecified  Current Risk: low Medications unspecified Objectives Related Problem: Partners verbally express empathy toward each other and demonstrate shared responsibility for reconstructing the relationship. Description: Clarify what level of trust exists for various aspects of the relationship. Target Date: 2023-05-28 Frequency: Daily Modality: individual Progress: 75%  Related Problem: Partners verbally express empathy toward each other and demonstrate shared responsibility for reconstructing the relationship. Description: List relationship issues that contribute to dissatisfaction and unhappiness and agree to address these in future conjoint sessions. Target Date: 2023-05-28 Frequency: Daily Modality: individual Progress: 70%  Related Problem: Partners verbally express empathy toward each other and demonstrate shared responsibility for reconstructing the relationship. Description: Identify and list behavioral changes for self and partner that would enhance the relationship. Target Date: 2023-05-28 Frequency: Daily Modality: individual Progress:  75%  Related Problem: Partners verbally express empathy toward each other and demonstrate shared responsibility for reconstructing the relationship. Description: Describe pre-affair relationship functioning. Target Date: 2023-05-28 Frequency: Daily Modality: individual Progress: 80%  Related Problem: Partners verbally express empathy toward each other and demonstrate shared responsibility for reconstructing the relationship. Description: Ask the partners to generate factors that they believe contributed to the affair. Target Date: 2023-05-28 Frequency: Daily Modality: individual Progress: 80%   Problem: Partners verbally express empathy toward each other and demonstrate shared responsibility for reconstructing the relationship. Description: Hurt partner uses anxiety-management techniques to deal with intrusive thoughts about the affair. Target Date: 2023-05-28 Frequency: Daily Modality: individual Progress: 55%  Client Response full compliance  Service Location Location, [address] [place], [place] [address]  Service Code cpt [phone]  Self care activities  Provide education, information  Emotion regulation skills  Validate/empathize  Identify/label emotions  Facilitate problem solving  Comments  Patient agrees to a video (Caregility) session and understands the limits of this platform. Patient is at home and provider is in home office.   Dx.: Depression  Meds: Xanax. Tried antidep. meds, but she has significant side-effects. Goals: They are seeking counseling to resolve significant marital conflict secondary to infidelity. [name] has experienced symptoms of both anxiety and depression during this period and is hoping to reduce  these symptoms as the relationship issues improve. [name] is experiencing some depressive symptoms as well, along with considerable anger and frustration. Goal date 12-24   [name] and [name]: He states that he is applying for a new job in same company  which pays higher and entails a move. Not sure how he feels about it.  In response to the question if she can envision being happy with [name], she struggles to answer due to the lack of trust. She says that [name]'s happiness lies within having money. He does not disagree. [name] says the only way she can trust [name] again is if he admits to recent communication with the woman he was seeing. He says she will never trust him no matter what he does. She is angry that [name] shows no sympathy for what he has "done to her". He doesn't believe there is anyway  her anger and distrust will go away. Talked about the decision of staying together or breaking up.                       [name], Ph.D.  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 (Status: maintained) -- No Description Entered  Medication Status compliance  Safety none  If Suicidal or Homicidal State Action Taken: unspecified  Current Risk: low Medications unspecified Objectives Related Problem: Partners verbally express empathy toward each other and demonstrate shared responsibility for reconstructing the relationship. Description: Clarify what level of trust exists for various aspects of the relationship. Target Date: 2023-05-28 Frequency:  Daily Modality: individual Progress: 75%  Related Problem: Partners verbally express empathy toward each other and demonstrate shared responsibility for reconstructing the relationship. Description: List relationship issues that contribute to dissatisfaction and unhappiness and agree to address these in future conjoint sessions. Target Date: 2023-05-28 Frequency: Daily Modality: individual Progress: 70%  Related Problem: Partners verbally express empathy toward each other and demonstrate shared responsibility for reconstructing the relationship. Description: Identify and list behavioral changes for self and partner that would enhance the relationship. Target Date: 2023-05-28 Frequency: Daily Modality: individual Progress: 75%  Related Problem: Partners verbally express empathy toward each other and demonstrate shared responsibility for reconstructing the relationship. Description: Describe pre-affair relationship functioning. Target Date: 2023-05-28 Frequency: Daily Modality: individual Progress:  80%  Related Problem: Partners verbally express empathy toward each other and demonstrate shared responsibility for reconstructing the relationship. Description: Ask the partners to generate factors that they believe contributed to the affair. Target Date: 2023-05-28 Frequency: Daily Modality: individual Progress: 80%   Problem: Partners verbally express empathy toward each other and demonstrate shared responsibility for reconstructing the relationship. Description: Hurt partner uses anxiety-management techniques to deal with intrusive thoughts about the affair. Target Date: 2023-05-28 Frequency: Daily Modality: individual Progress: 55%  Client Response full compliance  Service Location Location, [address] [place], [place] [address]  Service Code cpt [phone]  Self care activities  Provide education, information  Emotion regulation skills  Validate/empathize  Identify/label  emotions  Facilitate problem solving  Comments  Patient agrees to a video (Caregility) session and understands the limits of this platform. Patient is at home and provider is in home office.   Dx.: Depression  Meds: Xanax. Tried antidep. meds, but she has significant side-effects. Goals: They are seeking counseling to resolve significant marital conflict secondary to infidelity. [name] has experienced symptoms of both anxiety and depression during this period and is hoping to reduce these symptoms as the relationship issues improve. [name] is experiencing some depressive symptoms as well, along with considerable anger and frustration. Goal date 12-24   [name] and [name]: They are home for the weekend. 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 [name] states that even when they are getting along, their conflict is just under the surface and anything can trigger a fight. We talked about the difference between showing compassion and apologizing. He does not show any compassion and he gets frustrated when she expresses her unhappiness. She also says that one of their issues is that he claims that [name] feels she is superior to him. He agrees that he feels she portrays herself as being better than him, which enrages him.                  [name], Ph.D.  Time 5:10p-6:00p 50 minutes

## 2023-04-02 NOTE — Telephone Encounter (Signed)
Pharmacy Patient Advocate Encounter   Received notification from Patient Advice Request messages that prior authorization for [facility] 2.4MG /0.75ML auto-injectors is required/requested.   Insurance verification completed.   The patient is insured through [employer] .   Per test claim: CANCELLED due to Plan exclusion  Message from [employer]: Drug is not covered by plan   Key: [id number]

## 2023-04-02 NOTE — Telephone Encounter (Signed)
Pt notified via My Chart

## 2023-04-03 ENCOUNTER — Other Ambulatory Visit (HOSPITAL_COMMUNITY): Payer: Self-pay

## 2023-04-05 ENCOUNTER — Other Ambulatory Visit (HOSPITAL_COMMUNITY): Payer: Self-pay

## 2023-04-05 NOTE — Telephone Encounter (Signed)
Pt.notified

## 2023-04-07 ENCOUNTER — Telehealth: Payer: Self-pay

## 2023-04-07 ENCOUNTER — Encounter: Payer: Self-pay | Admitting: Family Medicine

## 2023-04-07 ENCOUNTER — Ambulatory Visit: Payer: BC Managed Care – PPO | Admitting: Psychology

## 2023-04-07 ENCOUNTER — Other Ambulatory Visit (HOSPITAL_COMMUNITY): Payer: Self-pay

## 2023-04-07 DIAGNOSIS — F332 Major depressive disorder, recurrent severe without psychotic features: Secondary | ICD-10-CM

## 2023-04-07 DIAGNOSIS — F339 Major depressive disorder, recurrent, unspecified: Secondary | ICD-10-CM

## 2023-04-07 DIAGNOSIS — F4322 Adjustment disorder with anxiety: Secondary | ICD-10-CM | POA: Diagnosis not present

## 2023-04-07 NOTE — Progress Notes (Signed)
Date: 04/07/2023  Treatment Plan Diagnosis 296.33 (Major depressive affective disorder, recurrent episode, severe, without mention of psychotic behavior) [n/a]  F43.22 (Adjustment Disorder, With anxiety) [n/a]  Symptoms One partner engages in sexual behavior (e.g., penile-vaginal intercourse, oral sex, anal sex) that violates the explicit or implicit expectations of the relationship. (Status: maintained) -- No Description Entered  Medication Status compliance  Safety none  If Suicidal or Homicidal State Action Taken: unspecified  Current Risk: low Medications unspecified Objectives Related Problem: Partners verbally express empathy toward each other and demonstrate shared responsibility for reconstructing the relationship. Description: Clarify what level of trust exists for various aspects of the relationship. Target Date: 2023-05-28 Frequency: Daily Modality: individual Progress: 75%  Related Problem: Partners verbally express empathy toward each other and demonstrate shared responsibility for reconstructing the relationship. Description: List relationship issues that contribute to dissatisfaction and unhappiness and agree to address these in future conjoint sessions. Target Date: 2023-05-28 Frequency: Daily Modality: individual Progress: 70%  Related Problem: Partners verbally express empathy toward each other and demonstrate shared responsibility for reconstructing the relationship. Description: Identify and list behavioral changes for self and partner that would enhance the relationship. Target Date: 2023-05-28 Frequency:  Daily Modality: individual Progress: 75%  Related Problem: Partners verbally express empathy toward each other and demonstrate shared responsibility for reconstructing the relationship. Description: Describe pre-affair relationship functioning. Target Date: 2023-05-28 Frequency: Daily Modality: individual Progress: 80%  Related Problem: Partners verbally express empathy toward each other and demonstrate shared responsibility for reconstructing the relationship. Description: Ask the partners to generate factors that they believe contributed to the affair. Target Date: 2023-05-28 Frequency: Daily Modality: individual Progress: 80%   Problem: Partners verbally express empathy toward each other and demonstrate shared responsibility for reconstructing the relationship. Description: Hurt partner uses anxiety-management techniques to deal with intrusive thoughts about the affair. Target Date: 2023-05-28 Frequency: Daily Modality: individual Progress: 55%  Client Response full compliance  Service Location Location, [address] [place], [place] [address]  Service Code cpt [phone]  Self care activities  Provide education, information  Emotion regulation skills  Validate/empathize  Identify/label emotions  Facilitate problem solving  Comments  Patient agrees to a video (Caregility) session and understands the limits of this platform. Patient is at home and provider is in home office.   Dx.: Depression  Meds: Xanax. Tried antidep. meds, but she has significant side-effects. Goals: They are seeking counseling to resolve significant marital conflict secondary to infidelity. [name] has  experienced symptoms of both anxiety and depression during this period and is hoping to reduce these symptoms as the relationship issues improve. [name] is experiencing some depressive symptoms as well, along with considerable anger and frustration. Goal date 12-24   [name] and [name]: He states that he is  applying for a new job in same company which pays higher and entails a move. Not sure how he feels about it.  In response to the question if she can envision being happy with [name], she struggles to answer due to the lack of trust. She says that [name]'s happiness lies within having money. He does not disagree. Syvilla says the only way she can trust [name] again is if he admits to recent communication with the woman he was seeing. He says she will never trust him no matter what he does. She is angry that [name] shows no sympathy for what he has "done to her". He doesn't believe there is anyway  her anger and distrust will go away. Talked about the decision of staying together or breaking up.                       [name], Ph.D.  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 (Status: maintained) -- No Description Entered  Medication Status compliance  Safety none  If Suicidal or Homicidal State Action Taken: unspecified  Current Risk: low Medications unspecified Objectives Related Problem: Partners verbally express empathy toward each other and demonstrate shared responsibility for reconstructing the relationship. Description: Clarify what level of trust exists for various aspects of the  relationship. Target Date: 2023-05-28 Frequency: Daily Modality: individual Progress: 75%  Related Problem: Partners verbally express empathy toward each other and demonstrate shared responsibility for reconstructing the relationship. Description: List relationship issues that contribute to dissatisfaction and unhappiness and agree to address these in future conjoint sessions. Target Date: 2023-05-28 Frequency: Daily Modality: individual Progress: 70%  Related Problem: Partners verbally express empathy toward each other and demonstrate shared responsibility for reconstructing the relationship. Description: Identify and list behavioral changes for self and partner that would enhance the relationship. Target Date: 2023-05-28 Frequency: Daily Modality: individual Progress: 75%  Related Problem: Partners verbally express empathy toward each other and demonstrate shared responsibility for reconstructing  the relationship. Description: Describe pre-affair relationship functioning. Target Date: 2023-05-28 Frequency: Daily Modality: individual Progress: 80%  Related Problem: Partners verbally express empathy toward each other and demonstrate shared responsibility for reconstructing the relationship. Description: Ask the partners to generate factors that they believe contributed to the affair. Target Date: 2023-05-28 Frequency: Daily Modality: individual Progress: 80%   Problem: Partners verbally express empathy toward each other and demonstrate shared responsibility for reconstructing the relationship. Description: Hurt partner uses anxiety-management techniques to deal with intrusive thoughts about the affair. Target Date: 2023-05-28 Frequency: Daily Modality: individual Progress: 55%  Client Response full compliance  Service Location Location, [address] [place], [place] [address]  Service Code cpt [phone]  Self care activities  Provide education, information  Emotion  regulation skills  Validate/empathize  Identify/label emotions  Facilitate problem solving  Comments  Patient agrees to a video (Caregility) session and understands the limits of this platform. Patient is at home and provider is in home office.   Dx.: Depression  Meds: Xanax. Tried antidep. meds, but she has significant side-effects. Goals: They are seeking counseling to resolve significant marital conflict secondary to infidelity. [name] has experienced symptoms of both anxiety and depression during this period and is hoping to reduce these symptoms as the relationship issues improve. [name] is experiencing some depressive symptoms as well, along with considerable anger and frustration. Goal date 12-24   [name] and [name]: Jaimya says that she feels [name] is a narcissist and is depressed. She also says he will not help himself and that she feels that he is not being honest with Camila Li. [name] is frustrated and angry and says that she has never accepted his effort to say "I'M sorry". They are supposed to go to [place] together, but are having frequent arguments. Questions if it is a good idea, but they feel that they do better when traveling. She is clear that she does not feel loved by [name] and it is "worse now" than ever before. He cannot control his agitation and claims that Iniya participates and keeps the arguments going. Challenged them to consider that under current and longstanding distrust and belief that change is not possible, they need to seriously consider a separation. Both are chronically unhappy.                       [name], Ph.D.  Time 9:35a-10:30a 50 minutes

## 2023-04-08 NOTE — Telephone Encounter (Signed)
Pt.notified

## 2023-04-09 ENCOUNTER — Telehealth: Payer: Self-pay

## 2023-04-09 NOTE — Telephone Encounter (Signed)
 Transition Care Management Unsuccessful Follow-up Telephone Call  Date of discharge and from where:  [facility] 10/7  Attempts:  2nd Attempt  Reason for unsuccessful TCM follow-up call:  No answer/busy   Lenard Forth [place]  [facility], [facility] Guide, Phone: [phone] Website: [email or web address]

## 2023-04-09 NOTE — Telephone Encounter (Signed)
Transition Care Management Unsuccessful Follow-up Telephone Call  Date of discharge and from where:  [facility] 10/7  Attempts:  1st Attempt  Reason for unsuccessful TCM follow-up call:  No answer/busy   Lenard Forth [place]  [facility], [facility] Guide, Phone: [phone] Website: [email or web address]

## 2023-04-13 ENCOUNTER — Other Ambulatory Visit (HOSPITAL_COMMUNITY): Payer: Self-pay

## 2023-04-13 ENCOUNTER — Other Ambulatory Visit: Payer: Self-pay

## 2023-04-16 ENCOUNTER — Ambulatory Visit: Payer: BC Managed Care – PPO | Admitting: Psychology

## 2023-04-16 ENCOUNTER — Other Ambulatory Visit (HOSPITAL_COMMUNITY): Payer: Self-pay

## 2023-04-16 NOTE — Telephone Encounter (Signed)
Pharmacy Patient Advocate Encounter  Received notification from [facility] that Prior Authorization for [facility] 2.4MG /0.75ML auto-injectors has been APPROVED from 03/08/23 to 11/09/23   PA #/Case ID/Reference #: [id number]

## 2023-04-20 ENCOUNTER — Other Ambulatory Visit (HOSPITAL_COMMUNITY): Payer: Self-pay

## 2023-04-21 ENCOUNTER — Ambulatory Visit: Payer: BC Managed Care – PPO | Admitting: Psychology

## 2023-04-21 DIAGNOSIS — F4322 Adjustment disorder with anxiety: Secondary | ICD-10-CM | POA: Diagnosis not present

## 2023-04-21 DIAGNOSIS — F332 Major depressive disorder, recurrent severe without psychotic features: Secondary | ICD-10-CM

## 2023-04-21 DIAGNOSIS — F339 Major depressive disorder, recurrent, unspecified: Secondary | ICD-10-CM

## 2023-04-21 NOTE — Progress Notes (Signed)
Date: 04/21/2023  Treatment Plan Diagnosis 296.33 (Major depressive affective disorder, recurrent episode, severe, without mention of psychotic behavior) [n/a]  F43.22 (Adjustment Disorder, With anxiety) [n/a]  Symptoms One partner engages in sexual behavior (e.g., penile-vaginal intercourse, oral sex, anal sex) that violates the explicit or implicit expectations of the relationship. (Status: maintained) -- No Description Entered  Medication Status compliance  Safety none  If Suicidal or Homicidal State Action Taken: unspecified  Current Risk: low Medications unspecified Objectives Related Problem: Partners verbally express empathy toward each other and demonstrate shared responsibility for reconstructing the relationship. Description: Clarify what level of trust exists for various aspects of the relationship. Target Date: 2023-05-28 Frequency: Daily Modality: individual Progress: 75%  Related Problem: Partners verbally express empathy toward each other and demonstrate shared responsibility for reconstructing the relationship. Description: List relationship issues that contribute to dissatisfaction and unhappiness and agree to address these in future conjoint sessions. Target Date: 2023-05-28 Frequency: Daily Modality: individual Progress: 70%  Related Problem: Partners verbally express empathy toward each other and demonstrate shared responsibility for reconstructing the relationship. Description: Identify and list behavioral changes for self and partner that would enhance the relationship. Target Date: 2023-05-28 Frequency:  Daily Modality: individual Progress: 75%  Related Problem: Partners verbally express empathy toward each other and demonstrate shared responsibility for reconstructing the relationship. Description: Describe pre-affair relationship functioning. Target Date: 2023-05-28 Frequency: Daily Modality: individual Progress: 80%  Related Problem: Partners verbally express empathy toward each other and demonstrate shared responsibility for reconstructing the relationship. Description: Ask the partners to generate factors that they believe contributed to the affair. Target Date: 2023-05-28 Frequency: Daily Modality: individual Progress: 80%   Problem: Partners verbally express empathy toward each other and demonstrate shared responsibility for reconstructing the relationship. Description: Hurt partner uses anxiety-management techniques to deal with intrusive thoughts about the affair. Target Date: 2023-05-28 Frequency: Daily Modality: individual Progress: 55%  Client Response full compliance  Service Location Location, [address] [place], [place] [address]  Service Code cpt [phone]  Self care activities  Provide education, information  Emotion regulation skills  Validate/empathize  Identify/label emotions  Facilitate problem solving  Comments  Patient agrees to a video (Caregility) session and understands the limits of this platform. Patient is at home and provider is in home office.   Dx.: Depression  Meds: Xanax. Tried antidep. meds, but she [name] significant side-effects. Goals: They are seeking counseling to resolve significant marital conflict secondary to infidelity. [name]  experienced symptoms of both anxiety and depression during this period and is hoping to reduce these symptoms as the relationship issues improve. [name] is experiencing some depressive symptoms as well, along with considerable anger and frustration. Goal date 12-24   [name] and [name]: He states that he is  applying for a new job in same company which pays higher and entails a move. Not sure how he feels about it.  In response to the question if she can envision being happy with [name], she struggles to answer due to the lack of trust. She says that [name]'s happiness lies within having money. He does not disagree. [name] says the only way she can trust [name] again is if he admits to recent communication with the woman he was seeing. He says she will never trust him no matter what he does. She is angry that [name] shows no sympathy for what he [name] "done to her". He doesn't believe there is anyway  her anger and distrust will go away. Talked about the decision of staying together or breaking up.                       [name], Ph.D.  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 (Status: maintained) -- No Description Entered  Medication Status compliance  Safety none  If Suicidal or Homicidal State Action Taken: unspecified  Current Risk: low Medications unspecified Objectives Related Problem: Partners verbally express empathy toward each other and demonstrate shared responsibility for reconstructing the relationship. Description: Clarify what level of trust exists for various aspects of the  relationship. Target Date: 2023-05-28 Frequency: Daily Modality: individual Progress: 75%  Related Problem: Partners verbally express empathy toward each other and demonstrate shared responsibility for reconstructing the relationship. Description: List relationship issues that contribute to dissatisfaction and unhappiness and agree to address these in future conjoint sessions. Target Date: 2023-05-28 Frequency: Daily Modality: individual Progress: 70%  Related Problem: Partners verbally express empathy toward each other and demonstrate shared responsibility for reconstructing the relationship. Description: Identify and list behavioral changes for self and partner that would enhance the relationship. Target Date: 2023-05-28 Frequency: Daily Modality: individual Progress: 75%  Related Problem: Partners verbally express empathy toward each other and demonstrate shared responsibility for reconstructing  the relationship. Description: Describe pre-affair relationship functioning. Target Date: 2023-05-28 Frequency: Daily Modality: individual Progress: 80%  Related Problem: Partners verbally express empathy toward each other and demonstrate shared responsibility for reconstructing the relationship. Description: Ask the partners to generate factors that they believe contributed to the affair. Target Date: 2023-05-28 Frequency: Daily Modality: individual Progress: 80%   Problem: Partners verbally express empathy toward each other and demonstrate shared responsibility for reconstructing the relationship. Description: Hurt partner uses anxiety-management techniques to deal with intrusive thoughts about the affair. Target Date: 2023-05-28 Frequency: Daily Modality: individual Progress: 55%  Client Response full compliance  Service Location Location, [address] [place], [place] [address]  Service Code cpt [phone]  Self care activities  Provide education, information  Emotion  regulation skills  Validate/empathize  Identify/label emotions  Facilitate problem solving  Comments  Patient agrees to a video (Caregility) session and understands the limits of this platform. Patient is at home and provider is in home office.   Dx.: Depression  Meds: Xanax. Tried antidep. meds, but she [name] significant side-effects. Goals: They are seeking counseling to resolve significant marital conflict secondary to infidelity. [name] experienced symptoms of both anxiety and depression during this period and is hoping to reduce these symptoms as the relationship issues improve. [name] is experiencing some depressive symptoms as well, along with considerable anger and frustration. Goal date 12-24   [name] and [name]: Karen Kays and [name] shared a letter they got from their son's "second baby mama". She was very hostile and accusatory. [name] wrote a response that they have not sent and [place] doesn't approve. They are unable to script a response they can agree upon. They ware just seeking to be able to see and have a relationship with grandchildren, but the mother wants them to go through their son Rutherford Nail, who is very irresponsible. Suggest that they keep response short and respectable with an eye on their ultimate goal. They report they have been getting along better the past couple of weeks.                          [name], Ph.D.  Time 5:10p-6:00p 50 minutes

## 2023-04-26 ENCOUNTER — Telehealth: Payer: Self-pay | Admitting: Family Medicine

## 2023-04-26 ENCOUNTER — Other Ambulatory Visit: Payer: Self-pay | Admitting: Family Medicine

## 2023-04-26 ENCOUNTER — Other Ambulatory Visit: Payer: Self-pay

## 2023-04-26 NOTE — Telephone Encounter (Signed)
If interested in case management services for patient/care coordination give her a call

## 2023-04-27 ENCOUNTER — Ambulatory Visit: Payer: BC Managed Care – PPO | Admitting: Psychology

## 2023-04-27 ENCOUNTER — Encounter: Payer: Self-pay | Admitting: Family Medicine

## 2023-04-27 DIAGNOSIS — F339 Major depressive disorder, recurrent, unspecified: Secondary | ICD-10-CM

## 2023-04-27 DIAGNOSIS — F32A Depression, unspecified: Secondary | ICD-10-CM

## 2023-04-27 NOTE — Progress Notes (Signed)
    Date: 04/27/2023  Treatment Plan Diagnosis 296.33 (Major depressive affective disorder, recurrent episode, severe, without mention of psychotic behavior) [n/a]  F43.22 (Adjustment Disorder, With anxiety) [n/a]  Symptoms One partner engages in sexual behavior (e.g., penile-vaginal intercourse, oral sex, anal sex) that violates the explicit or implicit expectations of the relationship. (Status: maintained) -- No Description Entered  Medication Status compliance  Safety none  If Suicidal or Homicidal State Action Taken: unspecified  Current Risk: low Medications unspecified Objectives Related Problem: Partners verbally express empathy toward each other and demonstrate shared responsibility for reconstructing the relationship. Description: Clarify what level of trust exists for various aspects of the relationship. Target Date: 2023-05-28 Frequency: Daily Modality: individual Progress: 75%  Related Problem: Partners verbally express empathy toward each other and demonstrate shared responsibility for reconstructing the relationship. Description: List relationship issues that contribute to dissatisfaction and unhappiness and agree to address these in future conjoint sessions. Target Date: 2023-05-28 Frequency: Daily Modality: individual Progress: 70%  Related Problem: Partners verbally express empathy toward each other and demonstrate shared responsibility for reconstructing the relationship. Description: Identify and list behavioral changes for self and partner that would enhance the relationship. Target Date: 2023-05-28 Frequency: Daily Modality: individual Progress: 75%  Related Problem: Partners verbally express empathy toward each other and demonstrate shared responsibility for reconstructing the relationship. Description: Describe pre-affair relationship functioning. Target Date: 2023-05-28 Frequency: Daily Modality: individual Progress: 80%  Related Problem: Partners  verbally express empathy toward each other and demonstrate shared responsibility for reconstructing the relationship. Description: Ask the partners to generate factors that they believe contributed to the affair. Target Date: 2023-05-28 Frequency: Daily Modality: individual Progress: 80%   Problem: Partners verbally express empathy toward each other and demonstrate shared responsibility for reconstructing the relationship. Description: Hurt partner uses anxiety-management techniques to deal with intrusive thoughts about the affair. Target Date: 2023-05-28 Frequency: Daily Modality: individual Progress: 55%  Client Response full compliance  Service Location Location, [address] [place], [place] [address]  Service Code cpt [phone]  Self care activities  Provide education, information  Emotion regulation skills  Validate/empathize  Identify/label emotions  Facilitate problem solving  Comments  Patient agrees to a video (Caregility) session and understands the limits of this platform. Patient is at home and provider is in home office.   Dx.: Depression  Meds: Xanax. Tried antidep. meds, but she has significant side-effects. Goals: They are seeking counseling to resolve significant marital conflict secondary to infidelity. [name] has experienced symptoms of both anxiety and depression during this period and is hoping to reduce these symptoms as the relationship issues improve. [name] is experiencing some depressive symptoms as well, along with considerable anger and frustration. Goal date 12-24   [name] and [name]: [name] and [name] are still unclear of their Thanksgiving plans due to all of the drama with their grandchild's mother. We discussed the best plan of action for them. They discussed future plans and whether they would sell their house and if [name] would consider a new job. She does not believe that [name] will actually follow through with finding a new job "due to his  depression". Discussed how/when to make a decision about the holiday and to not linger.                              [name], Ph.D.  Time 4:15p-5:00p 45 minutes

## 2023-04-28 ENCOUNTER — Other Ambulatory Visit: Payer: Self-pay

## 2023-04-28 ENCOUNTER — Other Ambulatory Visit (HOSPITAL_COMMUNITY): Payer: Self-pay

## 2023-04-28 MED ORDER — ESOMEPRAZOLE MAGNESIUM 40 MG PO CPDR
40.0000 mg | DELAYED_RELEASE_CAPSULE | Freq: Every day | ORAL | 0 refills | Status: DC
Start: 2023-04-28 — End: 2023-07-23
  Filled 2023-04-28: qty 30, 30d supply, fill #0
  Filled 2023-05-28: qty 30, 30d supply, fill #1
  Filled 2023-06-26 (×2): qty 30, 30d supply, fill #2

## 2023-04-28 MED ORDER — HYDROCHLOROTHIAZIDE 25 MG PO TABS
25.0000 mg | ORAL_TABLET | Freq: Every day | ORAL | 0 refills | Status: DC
Start: 2023-04-28 — End: 2023-07-23
  Filled 2023-04-28: qty 30, 30d supply, fill #0
  Filled 2023-05-28: qty 30, 30d supply, fill #1
  Filled 2023-06-26 (×2): qty 30, 30d supply, fill #2

## 2023-04-28 MED ORDER — FAMOTIDINE 40 MG PO TABS
40.0000 mg | ORAL_TABLET | Freq: Every day | ORAL | 1 refills | Status: DC
Start: 2023-04-28 — End: 2023-09-06
  Filled 2023-04-28: qty 30, 30d supply, fill #0
  Filled 2023-05-28: qty 30, 30d supply, fill #1
  Filled 2023-08-27: qty 30, 30d supply, fill #2

## 2023-04-30 ENCOUNTER — Ambulatory Visit: Payer: BC Managed Care – PPO | Admitting: Psychology

## 2023-05-05 ENCOUNTER — Ambulatory Visit: Payer: BC Managed Care – PPO | Admitting: Psychology

## 2023-05-07 ENCOUNTER — Ambulatory Visit: Payer: BC Managed Care – PPO | Admitting: Psychology

## 2023-05-07 DIAGNOSIS — F332 Major depressive disorder, recurrent severe without psychotic features: Secondary | ICD-10-CM | POA: Diagnosis not present

## 2023-05-07 DIAGNOSIS — F4322 Adjustment disorder with anxiety: Secondary | ICD-10-CM

## 2023-05-07 DIAGNOSIS — F339 Major depressive disorder, recurrent, unspecified: Secondary | ICD-10-CM

## 2023-05-07 NOTE — Progress Notes (Signed)
Date: 05/07/2023  Treatment Plan Diagnosis 296.33 (Major depressive affective disorder, recurrent episode, severe, without mention of psychotic behavior) [n/a]  F43.22 (Adjustment Disorder, With anxiety) [n/a]  Symptoms One partner engages in sexual behavior (e.g., penile-vaginal intercourse, oral sex, anal sex) that violates the explicit or implicit expectations of the relationship. (Status: maintained) -- No Description Entered  Medication Status compliance  Safety none  If Suicidal or Homicidal State Action Taken: unspecified  Current Risk: low Medications unspecified Objectives Related Problem: Partners verbally express empathy toward each other and demonstrate shared responsibility for reconstructing the relationship. Description: Clarify what level of trust exists for various aspects of the relationship. Target Date: 2024-05-27 Frequency: Daily Modality: individual Progress: 80%  Related Problem: Partners verbally express empathy toward each other and demonstrate shared responsibility for reconstructing the relationship. Description: List relationship issues that contribute to dissatisfaction and unhappiness and agree to address these in future conjoint sessions. Target Date: 2024-05-27 Frequency: Daily Modality: individual Progress: 75%  Related Problem: Partners verbally express empathy toward each other and demonstrate shared responsibility for reconstructing the relationship. Description: Identify and list behavioral changes for self and partner that would enhance the relationship. Target Date: 2024-05-27 Frequency: Daily Modality: individual Progress: 85%  Related Problem: Partners verbally express empathy toward each other and demonstrate shared responsibility for reconstructing the relationship. Description: Describe pre-affair relationship functioning. Target Date: 2024-05-27 Frequency: Daily Modality: individual Progress:  90%  Related Problem: Partners verbally express empathy toward each other and demonstrate shared responsibility for reconstructing the relationship. Description: Ask the partners to generate factors that they believe contributed to the affair. Target Date: 2024-05-27 Frequency: Daily Modality: individual Progress: 90%   Problem: Partners verbally express empathy toward each other and demonstrate shared responsibility for reconstructing the relationship. Description: Hurt partner uses anxiety-management techniques to deal with intrusive thoughts about the affair. Target Date: 2024-05-27 Frequency: Daily Modality: individual Progress: 65%  Client Response full compliance  Service Location Location, [address] [place], [place] [address]  Service Code cpt [phone]  Self care activities  Provide education, information  Emotion regulation skills  Validate/empathize  Identify/label emotions  Facilitate problem solving  Comments  Patient agrees to a video (Caregility) session and understands the limits of this platform. Patient is at home and provider is in home office.   Dx.: Depression  Meds: Xanax. Tried antidep. meds, but she has significant side-effects. Goals: They are seeking counseling to resolve significant marital conflict secondary to infidelity. [name] has experienced symptoms of both anxiety and depression during this period and is hoping to reduce these symptoms as the relationship issues improve. [name] is experiencing some depressive symptoms as well, along with considerable anger and frustration. Goal date 12-25   [name] and [name]: [name] and [name] came home for Thanksgiving. The kids did not come over and son did not let them know if he would have his kids. Daughter contacted them to asked about Thanksgiving plans and they told her dinner would be at 4 and she responded that she had plans with boyfriend's family. [name] and [name] had a hard time with each other as well.  They did not talk it out and were still upset the next day. They both were hurt about the kids and took it out on each other. We discussed ways to avoid having the conflict with their kids develop into conflict between the two of them.  [name], Ph.D.  Time 12:40p-1:30p 50 minutes

## 2023-05-11 ENCOUNTER — Other Ambulatory Visit: Payer: Self-pay

## 2023-05-11 ENCOUNTER — Ambulatory Visit: Payer: BC Managed Care – PPO | Admitting: Psychology

## 2023-05-11 DIAGNOSIS — F339 Major depressive disorder, recurrent, unspecified: Secondary | ICD-10-CM

## 2023-05-11 NOTE — Progress Notes (Unsigned)
Date: 05/11/2023  Treatment Plan Diagnosis 296.33 (Major depressive affective disorder, recurrent episode, severe, without mention of psychotic behavior) [n/a]  F43.22 (Adjustment Disorder, With anxiety) [n/a]  Symptoms One partner engages in sexual behavior (e.g., penile-vaginal intercourse, oral sex, anal sex) that violates the explicit or implicit expectations of the relationship. (Status: maintained) -- No Description Entered  Medication Status compliance  Safety none  If Suicidal or Homicidal State Action Taken: unspecified  Current Risk: low Medications unspecified Objectives Related Problem: Partners verbally express empathy toward each other and demonstrate shared responsibility for reconstructing the relationship. Description: Clarify what level of trust exists for various aspects of the relationship. Target Date: 2024-05-27 Frequency: Daily Modality: individual Progress: 80%  Related Problem: Partners verbally express empathy toward each other and demonstrate shared responsibility for reconstructing the relationship. Description: List relationship issues that contribute to dissatisfaction and unhappiness and agree to address these in future conjoint sessions. Target Date: 2024-05-27 Frequency: Daily Modality: individual Progress: 75%  Related Problem: Partners verbally express empathy toward each other and demonstrate shared responsibility for reconstructing the relationship. Description: Identify and list behavioral changes for self and partner that would enhance the relationship. Target Date: 2024-05-27 Frequency: Daily Modality: individual Progress: 85%  Related Problem: Partners verbally express empathy toward each other and demonstrate shared responsibility for reconstructing the relationship. Description: Describe pre-affair relationship functioning. Target Date: 2024-05-27 Frequency: Daily Modality: individual Progress:  90%  Related Problem: Partners verbally express empathy toward each other and demonstrate shared responsibility for reconstructing the relationship. Description: Ask the partners to generate factors that they believe contributed to the affair. Target Date: 2024-05-27 Frequency: Daily Modality: individual Progress: 90%   Problem: Partners verbally express empathy toward each other and demonstrate shared responsibility for reconstructing the relationship. Description: Hurt partner uses anxiety-management techniques to deal with intrusive thoughts about the affair. Target Date: 2024-05-27 Frequency: Daily Modality: individual Progress: 65%  Client Response full compliance  Service Location Location, [address] [place], [place] [address]  Service Code cpt [phone]  Self care activities  Provide education, information  Emotion regulation skills  Validate/empathize  Identify/label emotions  Facilitate problem solving  Comments  Patient agrees to a video (Caregility) session and understands the limits of this platform. Patient is at home and provider is in home office.   Dx.: Depression  Meds: Xanax. Tried antidep. meds, but she has significant side-effects. Goals: They are seeking counseling to resolve significant marital conflict secondary to infidelity. [name] has experienced symptoms of both anxiety and depression during this period and is hoping to reduce these symptoms as the relationship issues improve. [name] is experiencing some depressive symptoms as well, along with considerable anger and frustration. Goal date 12-25   [name] and [name]: [name] says things were fine until the weekend, following a terrible Thanksgiving. They had a very conflictual weekend. She says that they did well with each other for a while before having another conflict. This is the ongoing pattern in their relationship.They argued about the facts on their conflict over the weekend. They get caught up in  details of seemingly meaningless topics. They let the upsets with their kids impact how they treat each other. They get locked into conflict and cannot get themselves out of it.  [name], Ph.D.  Time 4:10p-5:00p 50 minutes               [name], PhD

## 2023-05-12 ENCOUNTER — Other Ambulatory Visit: Payer: Self-pay

## 2023-05-17 DIAGNOSIS — G90523 Complex regional pain syndrome I of lower limb, bilateral: Secondary | ICD-10-CM | POA: Diagnosis not present

## 2023-05-17 DIAGNOSIS — Z5181 Encounter for therapeutic drug level monitoring: Secondary | ICD-10-CM | POA: Diagnosis not present

## 2023-05-17 DIAGNOSIS — M1990 Unspecified osteoarthritis, unspecified site: Secondary | ICD-10-CM | POA: Diagnosis not present

## 2023-05-17 DIAGNOSIS — G35 Multiple sclerosis: Secondary | ICD-10-CM | POA: Diagnosis not present

## 2023-05-17 DIAGNOSIS — Z79899 Other long term (current) drug therapy: Secondary | ICD-10-CM | POA: Diagnosis not present

## 2023-05-19 ENCOUNTER — Ambulatory Visit (INDEPENDENT_AMBULATORY_CARE_PROVIDER_SITE_OTHER): Payer: BC Managed Care – PPO | Admitting: Psychology

## 2023-05-19 DIAGNOSIS — F332 Major depressive disorder, recurrent severe without psychotic features: Secondary | ICD-10-CM | POA: Diagnosis not present

## 2023-05-19 DIAGNOSIS — F339 Major depressive disorder, recurrent, unspecified: Secondary | ICD-10-CM

## 2023-05-19 DIAGNOSIS — F4322 Adjustment disorder with anxiety: Secondary | ICD-10-CM
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
## 2023-05-25 ENCOUNTER — Ambulatory Visit: Payer: BC Managed Care – PPO | Admitting: Psychology

## 2023-05-28 ENCOUNTER — Other Ambulatory Visit (HOSPITAL_COMMUNITY): Payer: Self-pay

## 2023-05-28 ENCOUNTER — Other Ambulatory Visit (HOSPITAL_COMMUNITY): Payer: Self-pay | Admitting: Emergency Medicine

## 2023-05-28 ENCOUNTER — Other Ambulatory Visit: Payer: Self-pay

## 2023-06-08 ENCOUNTER — Ambulatory Visit: Payer: BC Managed Care – PPO | Admitting: Psychology

## 2023-06-08 DIAGNOSIS — F339 Major depressive disorder, recurrent, unspecified: Secondary | ICD-10-CM
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
## 2023-06-09 ENCOUNTER — Other Ambulatory Visit (HOSPITAL_COMMUNITY): Payer: Self-pay

## 2023-06-09 ENCOUNTER — Telehealth: Payer: BC Managed Care – PPO | Admitting: Family Medicine

## 2023-06-09 ENCOUNTER — Encounter: Payer: Self-pay | Admitting: Family Medicine

## 2023-06-09 DIAGNOSIS — F1190 Opioid use, unspecified, uncomplicated: Secondary | ICD-10-CM

## 2023-06-09 DIAGNOSIS — M48061 Spinal stenosis, lumbar region without neurogenic claudication: Secondary | ICD-10-CM | POA: Diagnosis not present

## 2023-06-09 MED ORDER — AMPHETAMINE-DEXTROAMPHET ER 30 MG PO CP24
30.0000 mg | ORAL_CAPSULE | Freq: Every day | ORAL | 0 refills | Status: DC
  Filled 2023-06-09 – 2023-09-03 (×3): qty 30, 30d supply, fill #0

## 2023-06-09 MED ORDER — AMPHETAMINE-DEXTROAMPHET ER 30 MG PO CP24
30.0000 mg | ORAL_CAPSULE | Freq: Every day | ORAL | 0 refills | Status: DC
  Filled 2023-06-09 – 2023-08-01 (×3): qty 30, 30d supply, fill #0

## 2023-06-09 MED ORDER — HYDROMORPHONE HCL 8 MG PO TABS
8.0000 mg | ORAL_TABLET | Freq: Every day | ORAL | 0 refills | Status: DC
  Filled 2023-06-09 – 2023-08-20 (×3): qty 30, 30d supply, fill #0

## 2023-06-09 MED ORDER — AMPHETAMINE-DEXTROAMPHET ER 30 MG PO CP24
30.0000 mg | ORAL_CAPSULE | Freq: Every day | ORAL | 0 refills | Status: DC
  Filled 2023-06-09 – 2023-06-26 (×4): qty 30, 30d supply, fill #0

## 2023-06-09 MED ORDER — HYDROMORPHONE HCL 8 MG PO TABS
8.0000 mg | ORAL_TABLET | Freq: Every day | ORAL | 0 refills | Status: DC
  Filled 2023-06-09: qty 30, 30d supply, fill #0

## 2023-06-09 MED ORDER — HYDROMORPHONE HCL 8 MG PO TABS
8.0000 mg | ORAL_TABLET | Freq: Every day | ORAL | 0 refills | Status: DC
  Filled 2023-06-09 – 2023-07-09 (×2): qty 30, 30d supply, fill #0

## 2023-06-09 MED ORDER — OXYCODONE HCL 15 MG PO TABS
15.0000 mg | ORAL_TABLET | ORAL | 0 refills | Status: DC | PRN
  Filled 2023-06-09 – 2023-08-07 (×2): qty 180, 30d supply, fill #0

## 2023-06-09 MED ORDER — OXYCODONE HCL 15 MG PO TABS
15.0000 mg | ORAL_TABLET | ORAL | 0 refills | Status: DC | PRN
  Filled 2023-06-09 – 2023-07-10 (×2): qty 180, 30d supply, fill #0
  Filled ????-??-??: fill #0

## 2023-06-09 MED ORDER — OXYCODONE HCL 15 MG PO TABS
15.0000 mg | ORAL_TABLET | ORAL | 0 refills | Status: DC | PRN
  Filled 2023-06-09: qty 180, 30d supply, fill #0

## 2023-06-09 NOTE — Progress Notes (Signed)
 Subjective:    Patient ID: [name], female    DOB: 1976/04/07, 48 y.o.   MRN: [id number]  HPI Virtual Visit via Video Note  I connected with the patient on 06/09/23 at  8:45 AM EST by a video enabled telemedicine application and verified that I am speaking with the correct person using two identifiers.  Location patient: home Location provider:work or home office Persons participating in the virtual visit: patient, provider  I discussed the limitations of evaluation and management by telemedicine and the availability of in person appointments. The patient expressed understanding and agreed to proceed.   HPI: Here for pain management. She is doing well.   ROS: See pertinent positives and negatives per HPI.  Past Medical History:  Diagnosis Date   Allergy    Anxiety    Arthritis    B12 deficiency    Chronic narcotic use    Depression    not currently    Dysrhythmia    hx tachy-was from medication nortriptylline   Esophagitis    rx   Fibromyalgia    GERD (gastroesophageal reflux disease)    History of echocardiogram    Echo 4/18: Mild concentric LVH, vigorous LVF, EF 65-70, normal wall motion, grade 1 diastolic dysfunction   History of kidney stones    Hyperlipidemia    Intervertebral disc protrusion 01/11/2014   Migraine    Obesity (BMI 35.0-39.9 without comorbidity)    Peripheral neuropathy    Polycystic ovaries    Pulmonary embolus (HCC)    Spinal stenosis, lumbar    Vitamin D  deficiency     Past Surgical History:  Procedure Laterality Date   62 HOUR PH STUDY N/A 05/27/2016   Procedure: 24 HOUR PH STUDY;  Surgeon: Elspeth Deward Naval, MD;  Location: [facility];  Service: Gastroenterology;  Laterality: N/A;   ABDOMINAL EXPOSURE N/A 05/04/2018   Procedure: ABDOMINAL EXPOSURE;  Surgeon: Oris Krystal FALCON, MD;  Location: [facility] OR;  Service: Vascular;  Laterality: N/A;   ANTERIOR LUMBAR FUSION N/A 05/04/2018   Procedure: Lumbar Five Sacral One Anterior lumbar  interbody fusion;  Surgeon: Onetha Kuba, MD;  Location[facility] OR;  Service: Neurosurgery;  Laterality: N/A;  Lumbar Five Sacral One Anterior lumbar interbody fusion   APPENDECTOMY     BACK SURGERY  2012   left laminectomy at L3-4 per Dr. Amon    bladder tack  2012   CHOLECYSTECTOMY N/A 10/03/2015   Procedure: LAPAROSCOPIC CHOLECYSTECTOMY;  Surgeon: Herlene Beverley Bureau, MD;  Location: [facility] OR;  Service: General;  Laterality: N/A;   ESOPHAGEAL MANOMETRY N/A 05/27/2016   Procedure: ESOPHAGEAL MANOMETRY (EM);  Surgeon: Elspeth Deward Naval, MD;  Location: [facility];  Service: Gastroenterology;  Laterality: N/A;   EXCISION OF SKIN TAG  10/03/2015   Procedure: EXCISION OF SKIN TAG;  Surgeon: Herlene Beverley Kinsinger, MD;  Location: [facility] OR;  Service: General;;   LUMBAR DISC SURGERY  01-31-14   per Dr. Onetha    OOPHORECTOMY     left overy   OVARIAN CYST REMOVAL     RECTOCELE REPAIR     SINUSOTOMY  12/14/06   spinal fusion  2016/10   TONSILLECTOMY     TONSILLECTOMY     VAGINAL HYSTERECTOMY     WRIST GANGLION EXCISION Left     Family History  Problem Relation Age of Onset   Fibromyalgia Mother    Diabetes Mother    Thyroid  cancer Mother    Liver disease Mother    Neuropathy Mother  Cirrhosis Mother        non-alcoholic   Pulmonary embolism Mother        both lungs   Heart failure Mother    Hypertension Father    Diabetes Father    Heart disease Father    Prostate cancer Father    Heart attack Father        x 2   Colon cancer Neg Hx    Other Neg Hx        pheochromocytoma   Colon polyps Neg Hx      Current Outpatient Medications:    ALPRAZolam  (XANAX ) 0.5 MG tablet, TAKE 1 TABLET BY MOUTH THREE TIMES A DAY AS NEEDED, Disp: 90 tablet, Rfl: 5   amphetamine -dextroamphetamine  (ADDERALL  XR) 30 MG 24 hr capsule, Take 1 capsule (30 mg total) by mouth daily., Disp: 30 capsule, Rfl: 0   amphetamine -dextroamphetamine  (ADDERALL  XR) 30 MG 24 hr capsule, Take 1 capsule (30 mg total) by mouth  daily., Disp: 30 capsule, Rfl: 0   amphetamine -dextroamphetamine  (ADDERALL  XR) 30 MG 24 hr capsule, Take 1 capsule (30 mg total) by mouth daily., Disp: 30 capsule, Rfl: 0   atorvastatin  (LIPITOR) 20 MG tablet, Take 1 tablet (20 mg total) by mouth daily., Disp: 90 tablet, Rfl: 3   B-D 3CC LUER-LOK SYR 25GX1 25G X 1 3 ML MISC, USE AS DIRECTED, Disp: 9 each, Rfl: 2   cetirizine  (ZYRTEC ) 10 MG tablet, Take 1 tablet (10 mg total) by mouth daily., Disp: 90 tablet, Rfl: 3   clobetasol  (TEMOVATE ) 0.05 % external solution, clobetasol  0.05 % scalp solution, Disp: , Rfl:    cyanocobalamin  (VITAMIN B12) 1000 MCG/ML injection, Inject 1 mL (1,000 mcg total) into the muscle once a week., Disp: 30 mL, Rfl: 11   cyclobenzaprine  (FLEXERIL ) 10 MG tablet, Take 1 tablet (10 mg total) by mouth 3 (three) times daily as needed for muscle spasms., Disp: 90 tablet, Rfl: 5   esomeprazole  (NEXIUM ) 40 MG capsule, Take 1 capsule (40 mg total) by mouth daily., Disp: 90 capsule, Rfl: 0   famotidine  (PEPCID ) 40 MG tablet, Take 1 tablet (40 mg total) by mouth at bedtime., Disp: 90 tablet, Rfl: 1   fluconazole  (DIFLUCAN ) 150 MG tablet, TAKE 1 TABLET BY MOUTH EVERY DAY, Disp: 5 tablet, Rfl: 5   Fluocinolone Acetonide Body 0.01 % OIL, fluocinolone 0.01 % scalp oil and shower cap, Disp: , Rfl:    fluticasone  (FLONASE ) 50 MCG/ACT nasal spray, Place 2 sprays into both nostrils daily., Disp: 16 g, Rfl: 6   gabapentin  (NEURONTIN ) 300 MG capsule, Take 1 capsule (300 mg total) by mouth 3 (three) times daily., Disp: 270 capsule, Rfl: 1   hydrochlorothiazide  (HYDRODIURIL ) 25 MG tablet, Take 1 tablet (25 mg total) by mouth daily., Disp: 90 tablet, Rfl: 0   HYDROmorphone  (DILAUDID ) 8 MG tablet, Take 1 tablet (8 mg) by mouth at bedtime. 02/25/23, Disp: 30 tablet, Rfl: 0   HYDROmorphone  (DILAUDID ) 8 MG tablet, Take 1 tablet (8 mg) by mouth at bedtime., Disp: 30 tablet, Rfl: 0   HYDROmorphone  (DILAUDID ) 8 MG tablet, Take 1 tablet (8 mg) by mouth  at bedtime. 05/09/23, Disp: 30 tablet, Rfl: 0   lidocaine  (LIDODERM ) 5 %, Place 1 patch onto the skin daily. Remove & Discard patch within 12 hours or as directed by MD, Disp: 30 patch, Rfl: 5   Multiple Vitamins-Minerals (MULTI-VITAMIN GUMMIES PO), Take 2 tablets by mouth daily., Disp: , Rfl:    oxyCODONE  (ROXICODONE ) 15 MG immediate release tablet,  Take 1 tablet (15 mg total) by mouth every 4 (four) hours as needed for pain., Disp: 180 tablet, Rfl: 0   oxyCODONE  (ROXICODONE ) 15 MG immediate release tablet, Take 1 tablet (15 mg total) by mouth every 4 (four) hours as needed for pain., Disp: 180 tablet, Rfl: 0   oxyCODONE  (ROXICODONE ) 15 MG immediate release tablet, Take 1 tablet (15 mg total) by mouth every 4 (four) hours as needed for pain., Disp: 180 tablet, Rfl: 0   phenazopyridine  (PYRIDIUM ) 100 MG tablet, Take 1 tablet (100 mg total) by mouth 3 (three) times daily as needed for pain., Disp: 10 tablet, Rfl: 0   potassium chloride  (KLOR-CON  10) 10 MEQ tablet, Take 1 tablet (10 mEq total) by mouth daily., Disp: 90 tablet, Rfl: 3   promethazine  (PHENERGAN ) 25 MG tablet, Take 1 tablet (25 mg total) by mouth every 4 (four) hours as needed for nausea or vomiting., Disp: 90 tablet, Rfl: 5   Semaglutide -Weight Management (WEGOVY ) 2.4 MG/0.75ML SOAJ, Inject 2.4 mg into the skin once a week., Disp: 3 mL, Rfl: 5   sucralfate  (CARAFATE ) 1 g tablet, Take 1 tablet (1 g total) by mouth 4 (four) times daily., Disp: 120 tablet, Rfl: 2   SUMAtriptan  (IMITREX ) 100 MG tablet, Take 1 tablet earliest onset of migraine.  May repeat once in 2 hours if headache persists or recurs., Disp: 10 tablet, Rfl: 2   Vitamin D , Ergocalciferol , (DRISDOL ) 1.25 MG (50000 UNIT) CAPS capsule, Take 1 capsule by mouth every 7 days., Disp: 12 capsule, Rfl: 3   potassium chloride  SA (KLOR-CON  M) 20 MEQ tablet, Take 2 tablets (40 mEq total) by mouth daily for 7 days., Disp: 14 tablet, Rfl: 0  EXAM:  VITALS per patient if  applicable:  GENERAL: alert, oriented, appears well and in no acute distress  HEENT: atraumatic, conjunttiva clear, no obvious abnormalities on inspection of external nose and ears  NECK: normal movements of the head and neck  LUNGS: on inspection no signs of respiratory distress, breathing rate appears normal, no obvious gross SOB, gasping or wheezing  CV: no obvious cyanosis  MS: moves all visible extremities without noticeable abnormality  PSYCH/NEURO: pleasant and cooperative, no obvious depression or anxiety, speech and thought processing grossly intact  ASSESSMENT AND PLAN: Pain management. Indication for chronic opioid: low back pain Medication and dose: Oxycodone  15 mg and Hydromorphone  8 mg  # pills per month: 180 and 30 Last UDS date: 08-07-22 Opioid Treatment Agreement signed (Y/N): 04-17-19 Opioid Treatment Agreement last reviewed with patient:  06-09-23 [facility] reviewed this encounter (include red flags): Yes Meds were refilled.  Garnette Olmsted, MD  Discussed the following assessment and plan:  No diagnosis found.     I discussed the assessment and treatment plan with the patient. The patient was provided an opportunity to ask questions and all were answered. The patient agreed with the plan and demonstrated an understanding of the instructions.   The patient was advised to call back or seek an in-person evaluation if the symptoms worsen or if the condition fails to improve as anticipated.      Review of Systems     Objective:   Physical Exam        Assessment & Plan:

## 2023-06-10 ENCOUNTER — Other Ambulatory Visit (HOSPITAL_COMMUNITY): Payer: Self-pay

## 2023-06-10 ENCOUNTER — Other Ambulatory Visit (HOSPITAL_BASED_OUTPATIENT_CLINIC_OR_DEPARTMENT_OTHER): Payer: Self-pay

## 2023-06-10 ENCOUNTER — Other Ambulatory Visit: Payer: Self-pay

## 2023-06-11 ENCOUNTER — Other Ambulatory Visit (HOSPITAL_COMMUNITY): Payer: Self-pay

## 2023-06-15 ENCOUNTER — Ambulatory Visit: Payer: BC Managed Care – PPO | Admitting: Psychology

## 2023-06-15 DIAGNOSIS — F332 Major depressive disorder, recurrent severe without psychotic features: Secondary | ICD-10-CM

## 2023-06-15 DIAGNOSIS — F339 Major depressive disorder, recurrent, unspecified: Secondary | ICD-10-CM

## 2023-06-15 DIAGNOSIS — F4322 Adjustment disorder with anxiety: Secondary | ICD-10-CM
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
## 2023-06-16 ENCOUNTER — Ambulatory Visit: Payer: BC Managed Care – PPO | Admitting: Psychology

## 2023-06-17 ENCOUNTER — Other Ambulatory Visit (HOSPITAL_COMMUNITY): Payer: Self-pay

## 2023-06-17 ENCOUNTER — Other Ambulatory Visit: Payer: Self-pay

## 2023-06-18 ENCOUNTER — Other Ambulatory Visit: Payer: Self-pay

## 2023-06-18 ENCOUNTER — Other Ambulatory Visit (HOSPITAL_COMMUNITY): Payer: Self-pay

## 2023-06-21 ENCOUNTER — Other Ambulatory Visit (HOSPITAL_COMMUNITY): Payer: Self-pay

## 2023-06-22 ENCOUNTER — Ambulatory Visit: Payer: BC Managed Care – PPO | Admitting: Psychology

## 2023-06-22 ENCOUNTER — Other Ambulatory Visit (HOSPITAL_COMMUNITY): Payer: Self-pay

## 2023-06-22 DIAGNOSIS — F339 Major depressive disorder, recurrent, unspecified: Secondary | ICD-10-CM

## 2023-06-22 DIAGNOSIS — F4322 Adjustment disorder with anxiety: Secondary | ICD-10-CM | POA: Diagnosis not present

## 2023-06-22 NOTE — Progress Notes (Signed)
Date: 06/22/2023  Treatment Plan Diagnosis 296.33 (Major depressive affective disorder, recurrent episode, severe, without mention of psychotic behavior) [n/a]  F43.22 (Adjustment Disorder, With anxiety) [n/a]  Symptoms One partner engages in sexual behavior (e.g., penile-vaginal intercourse, oral sex, anal sex) that violates the explicit or implicit expectations of the relationship. (Status: maintained) -- No Description Entered  Medication Status compliance  Safety none  If Suicidal or Homicidal State Action Taken: unspecified  Current Risk: low Medications unspecified Objectives Related Problem: Partners verbally express empathy toward each other and demonstrate shared responsibility for reconstructing the relationship. Description: Clarify what level of trust exists for various aspects of the relationship. Target Date: 2024-05-27 Frequency: Daily Modality: individual Progress: 80%  Related Problem: Partners verbally express empathy toward each other and demonstrate shared responsibility for reconstructing the relationship. Description: List relationship issues that contribute to dissatisfaction and unhappiness and agree to address these in future conjoint sessions. Target Date: 2024-05-27 Frequency: Daily Modality: individual Progress: 75%  Related Problem: Partners verbally express empathy toward each other and demonstrate shared responsibility for reconstructing the relationship. Description: Identify and list behavioral changes for self and partner that would enhance the relationship. Target Date: 2024-05-27 Frequency: Daily Modality: individual Progress: 85%  Related Problem: Partners verbally express empathy toward each other and demonstrate shared responsibility for reconstructing the relationship. Description: Describe pre-affair relationship  functioning. Target Date: 2024-05-27 Frequency: Daily Modality: individual Progress: 90%  Related Problem: Partners verbally express empathy toward each other and demonstrate shared responsibility for reconstructing the relationship. Description: Ask the partners to generate factors that they believe contributed to the affair. Target Date: 2024-05-27 Frequency: Daily Modality: individual Progress: 90%   Problem: Partners verbally express empathy toward each other and demonstrate shared responsibility for reconstructing the relationship. Description: Hurt partner uses anxiety-management techniques to deal with intrusive thoughts about the affair. Target Date: 2024-05-27 Frequency: Daily Modality: individual Progress: 65%  Client Response full compliance  Service Location Location, [address] [place], [place] [address]  Service Code cpt [id number]  Self care activities  Provide education, information  Emotion regulation skills  Validate/empathize  Identify/label emotions  Facilitate problem solving  Comments  Patient agrees to a video (Caregility) session and understands the limits of this platform. Patient is at home and provider is in home office.   Dx.: Depression  Meds: Xanax. Tried antidep. meds, but she has significant side-effects. Goals: They are seeking counseling to resolve significant marital conflict secondary to infidelity. [name] has experienced symptoms of both anxiety and depression during this period and is hoping to reduce these symptoms as the relationship issues improve. [name] is experiencing some depressive symptoms as well, along with considerable anger and frustration. Goal date 12-25   [name] and [name]: I requested to talk with [name]'s therapist, and he sent [name] an updated permission form. [name] states he has no received the form at this time. After last difficult session, [name] went to help [name] move out of rental space in [place]. [name] admits that he  cannot control himself when he gets enraged. He claims that he feels when things are going to get out of hand and tells [name].  We talked about him taking responsibility for shutting down his rage, regardless how [name] responds (or triggers).                                                        [name], Ph.D.  Time 4:10p-5:00p 50 minutes

## 2023-06-26 ENCOUNTER — Other Ambulatory Visit (HOSPITAL_COMMUNITY): Payer: Self-pay

## 2023-06-28 ENCOUNTER — Other Ambulatory Visit: Payer: Self-pay

## 2023-06-30 ENCOUNTER — Ambulatory Visit: Payer: BC Managed Care – PPO | Admitting: Psychology

## 2023-06-30 DIAGNOSIS — F4322 Adjustment disorder with anxiety: Secondary | ICD-10-CM

## 2023-06-30 DIAGNOSIS — F332 Major depressive disorder, recurrent severe without psychotic features: Secondary | ICD-10-CM | POA: Diagnosis not present

## 2023-06-30 DIAGNOSIS — F339 Major depressive disorder, recurrent, unspecified: Secondary | ICD-10-CM

## 2023-06-30 NOTE — Progress Notes (Signed)
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 (Status: maintained) -- No Description Entered  Medication Status compliance  Safety none  If Suicidal or Homicidal State Action Taken: unspecified  Current Risk: low Medications unspecified Objectives Related Problem: Partners verbally express empathy toward each other and demonstrate shared responsibility for reconstructing the relationship. Description: Clarify what level of trust exists for various aspects of the relationship. Target Date: 2024-05-27 Frequency: Daily Modality: individual Progress: 80%  Related Problem: Partners verbally express empathy toward each other and demonstrate shared responsibility for reconstructing the relationship. Description: List relationship issues that contribute to dissatisfaction and unhappiness and agree to address these in future conjoint sessions. Target Date: 2024-05-27 Frequency: Daily Modality: individual Progress: 75%  Related Problem: Partners verbally express empathy toward each other and demonstrate shared responsibility for reconstructing the relationship. Description: Identify and list behavioral changes for self and partner that would enhance the relationship. Target Date: 2024-05-27 Frequency: Daily Modality: individual Progress: 85%  Related Problem: Partners verbally express empathy toward each other and demonstrate shared responsibility for reconstructing the relationship. Description:  Describe pre-affair relationship functioning. Target Date: 2024-05-27 Frequency: Daily Modality: individual Progress: 90%  Related Problem: Partners verbally express empathy toward each other and demonstrate shared responsibility for reconstructing the relationship. Description: Ask the partners to generate factors that they believe contributed to the affair. Target Date: 2024-05-27 Frequency: Daily Modality: individual Progress: 90%   Problem: Partners verbally express empathy toward each other and demonstrate shared responsibility for reconstructing the relationship. Description: Hurt partner uses anxiety-management techniques to deal with intrusive thoughts about the affair. Target Date: 2024-05-27 Frequency: Daily Modality: individual Progress: 65%  Client Response full compliance  Service Location Location, [address] [place], [place] [address]  Service Code cpt [phone]  Self care activities  Provide education, information  Emotion regulation skills  Validate/empathize  Identify/label emotions  Facilitate problem solving  Comments  Patient agrees to a video (Caregility) session and understands the limits of this platform. Patient is at home and provider is in home office.   Dx.: Depression  Meds: Xanax. Tried antidep. meds, but she has significant side-effects. Goals: They are seeking counseling to resolve significant marital conflict secondary to infidelity. [name] has experienced symptoms of both anxiety and depression during this period and is hoping to reduce these symptoms as the relationship issues improve. [name] is experiencing some depressive symptoms as well, along with considerable anger and frustration. Goal date 12-25   [name] and [name]: She says she cannot leave because she still loves him. Told them about my conversation with [name] regarding my concerns about their relationship. We discussed enabling of both of their kids and the need to set reasonable  limits. [name] has fears about how her daughter will respond if she sets limits and tells daughter "no".  They reviewed an argument about going to a movie together. No rationale for the argument. Neither has the ability at this point to see a clear path to avoid the conflict and they are focused on blaming each other. Will further dissect interaction at next session and try to identify triggers.                                                              [name], Ph.D.  Time 5:10p-6:00p 50 minutes

## 2023-07-06 ENCOUNTER — Ambulatory Visit: Payer: BC Managed Care – PPO | Admitting: Psychology

## 2023-07-06 DIAGNOSIS — F339 Major depressive disorder, recurrent, unspecified: Secondary | ICD-10-CM

## 2023-07-06 NOTE — Progress Notes (Signed)
 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 (Status: maintained) -- No Description Entered  Medication Status compliance  Safety none  If Suicidal or Homicidal State Action Taken: unspecified  Current Risk: low Medications unspecified Objectives Related Problem: Partners verbally express empathy toward each other and demonstrate shared responsibility for reconstructing the relationship. Description: Clarify what level of trust exists for various aspects of the relationship. Target Date: 2024-05-27 Frequency: Daily Modality: individual Progress: 80%  Related Problem: Partners verbally express empathy toward each other and demonstrate shared responsibility for reconstructing the relationship. Description: List relationship issues that contribute to dissatisfaction and unhappiness and agree to address these in future conjoint sessions. Target Date: 2024-05-27 Frequency: Daily Modality: individual Progress: 75%  Related Problem: Partners verbally express empathy toward each other and demonstrate shared responsibility for reconstructing the relationship. Description: Identify and list behavioral changes for self and partner that would enhance the relationship. Target Date: 2024-05-27 Frequency: Daily Modality: individual Progress: 85%  Related Problem: Partners verbally express empathy toward each other and demonstrate shared responsibility for reconstructing the  relationship. Description: Describe pre-affair relationship functioning. Target Date: 2024-05-27 Frequency: Daily Modality: individual Progress: 90%  Related Problem: Partners verbally express empathy toward each other and demonstrate shared responsibility for reconstructing the relationship. Description: Ask the partners to generate factors that they believe contributed to the affair. Target Date: 2024-05-27 Frequency: Daily Modality: individual Progress: 90%   Problem: Partners verbally express empathy toward each other and demonstrate shared responsibility for reconstructing the relationship. Description: Hurt partner uses anxiety-management techniques to deal with intrusive thoughts about the affair. Target Date: 2024-05-27 Frequency: Daily Modality: individual Progress: 65%  Client Response full compliance  Service Location Location, [address] [place], [place] [address]  Service Code cpt [phone]  Self care activities  Provide education, information  Emotion regulation skills  Validate/empathize  Identify/label emotions  Facilitate problem solving  Comments  Patient agrees to a video (Caregility) session and understands the limits of this platform. Patient is at home and provider is in home office.   Dx.: Depression  Meds: Xanax . Tried antidep. meds, but she has significant side-effects. Goals: They are seeking counseling to resolve significant marital conflict secondary to infidelity. Cena has experienced symptoms of both anxiety and depression during this period and is hoping to reduce these symptoms as the relationship issues improve. Marolyn is experiencing some depressive symptoms as well, along with considerable anger and frustration. Goal date 12-25   [name] and [name]: Marolyn has been sick and was in ED all day yesterday. Out of work all day today and will try to work tomorrow. They said there has been lots ofdrama with the kids this past week. His son got both  kids on Friday night and daughter got sick. In middle of night, had to call the mother because baby was sick. Son got caught in a lie with the baby mama, who then went on a rampage and communicating with their son's baby mama. We talked about learning to not enable their kids, particularly Norman. They need boundaries and need to be consistent.                                                                   CONI ALM KERNS, Ph.D.  Time 4:10p-5:00p 50 minutes

## 2023-07-09 ENCOUNTER — Other Ambulatory Visit: Payer: Self-pay | Admitting: Family Medicine

## 2023-07-09 ENCOUNTER — Other Ambulatory Visit (HOSPITAL_COMMUNITY): Payer: Self-pay

## 2023-07-10 ENCOUNTER — Other Ambulatory Visit (HOSPITAL_COMMUNITY): Payer: Self-pay

## 2023-07-14 ENCOUNTER — Ambulatory Visit: Payer: BC Managed Care – PPO | Admitting: Psychology

## 2023-07-17 ENCOUNTER — Other Ambulatory Visit (HOSPITAL_COMMUNITY): Payer: Self-pay

## 2023-07-19 ENCOUNTER — Encounter (HOSPITAL_COMMUNITY): Payer: Self-pay

## 2023-07-19 ENCOUNTER — Other Ambulatory Visit (HOSPITAL_COMMUNITY): Payer: Self-pay

## 2023-07-20 ENCOUNTER — Ambulatory Visit: Payer: BC Managed Care – PPO | Admitting: Psychology

## 2023-07-20 DIAGNOSIS — F332 Major depressive disorder, recurrent severe without psychotic features: Secondary | ICD-10-CM | POA: Diagnosis not present

## 2023-07-20 DIAGNOSIS — F339 Major depressive disorder, recurrent, unspecified: Secondary | ICD-10-CM

## 2023-07-20 NOTE — Progress Notes (Signed)
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 (Status: maintained) -- No Description Entered  Medication Status compliance  Safety none  If Suicidal or Homicidal State Action Taken: unspecified  Current Risk: low Medications unspecified Objectives Related Problem: Partners verbally express empathy toward each other and demonstrate shared responsibility for reconstructing the relationship. Description: Clarify what level of trust exists for various aspects of the relationship. Target Date: 2024-05-27 Frequency: Daily Modality: individual Progress: 80%  Related Problem: Partners verbally express empathy toward each other and demonstrate shared responsibility for reconstructing the relationship. Description: List relationship issues that contribute to dissatisfaction and unhappiness and agree to address these in future conjoint sessions. Target Date: 2024-05-27 Frequency: Daily Modality: individual Progress: 75%  Related Problem: Partners verbally express empathy toward each other and demonstrate shared responsibility for reconstructing the relationship. Description: Identify and list behavioral changes for self and partner that would enhance the relationship. Target Date: 2024-05-27 Frequency: Daily Modality: individual Progress: 85%  Related Problem: Partners verbally express empathy toward each other and demonstrate shared  responsibility for reconstructing the relationship. Description: Describe pre-affair relationship functioning. Target Date: 2024-05-27 Frequency: Daily Modality: individual Progress: 90%  Related Problem: Partners verbally express empathy toward each other and demonstrate shared responsibility for reconstructing the relationship. Description: Ask the partners to generate factors that they believe contributed to the affair. Target Date: 2024-05-27 Frequency: Daily Modality: individual Progress: 90%   Problem: Partners verbally express empathy toward each other and demonstrate shared responsibility for reconstructing the relationship. Description: Hurt partner uses anxiety-management techniques to deal with intrusive thoughts about the affair. Target Date: 2024-05-27 Frequency: Daily Modality: individual Progress: 65%  Client Response full compliance  Service Location Location, [address] [place], [place] [address]  Service Code cpt [phone]  Self care activities  Provide education, information  Emotion regulation skills  Validate/empathize  Identify/label emotions  Facilitate problem solving  Comments  Patient agrees to a video (Caregility) session and understands the limits of this platform. Patient is at home and provider is in home office.   Dx.: Depression  Meds: Xanax. Tried antidep. meds, but she has significant side-effects. Goals: They are seeking counseling to resolve significant marital conflict secondary to infidelity. [name] has experienced symptoms of both anxiety and depression during this period and is hoping to reduce these symptoms as the relationship issues improve. [name] is experiencing some depressive symptoms as well, along with considerable anger and frustration. Goal date 12-25   [name] and [name]: [name] had a birthday on the 5th and they had an Anniversary on the 15th, in addition to Valentine's Day. Will celebrate anniversary this weekend. We  discussed the need to believe each other when they share feelings and intension's. Neither of them fully trust the other and it continuously results in disagreements and anger. They have been getting along (mostly) over the past 2 weeks.                                                                      [name], Ph.D.  Time 4:10p-5:00p 50 minutes

## 2023-07-22 ENCOUNTER — Other Ambulatory Visit: Payer: Self-pay | Admitting: Family Medicine

## 2023-07-22 ENCOUNTER — Other Ambulatory Visit (HOSPITAL_COMMUNITY): Payer: Self-pay

## 2023-07-22 ENCOUNTER — Other Ambulatory Visit: Payer: Self-pay

## 2023-07-23 ENCOUNTER — Other Ambulatory Visit (HOSPITAL_COMMUNITY): Payer: Self-pay

## 2023-07-23 ENCOUNTER — Other Ambulatory Visit: Payer: Self-pay | Admitting: Family Medicine

## 2023-07-23 ENCOUNTER — Other Ambulatory Visit: Payer: Self-pay

## 2023-07-27 ENCOUNTER — Other Ambulatory Visit: Payer: Self-pay

## 2023-07-27 MED ORDER — CYANOCOBALAMIN 1000 MCG/ML IJ SOLN
1000.0000 ug | INTRAMUSCULAR | 11 refills | Status: AC
  Filled 2023-07-27: qty 4, 28d supply, fill #0
  Filled 2023-09-29: qty 4, 28d supply, fill #1
  Filled 2023-10-24: qty 4, 28d supply, fill #2
  Filled 2024-01-18: qty 4, 28d supply, fill #3

## 2023-07-27 MED ORDER — HYDROCHLOROTHIAZIDE 25 MG PO TABS
25.0000 mg | ORAL_TABLET | Freq: Every day | ORAL | 0 refills | Status: DC
  Filled 2023-07-27: qty 30, 30d supply, fill #0
  Filled 2023-08-27: qty 30, 30d supply, fill #1
  Filled 2023-09-29: qty 30, 30d supply, fill #2

## 2023-07-27 MED ORDER — PROMETHAZINE HCL 25 MG PO TABS
25.0000 mg | ORAL_TABLET | ORAL | 5 refills | Status: AC | PRN
  Filled 2023-07-27: qty 90, 15d supply, fill #0

## 2023-07-27 MED ORDER — ESOMEPRAZOLE MAGNESIUM 40 MG PO CPDR
40.0000 mg | DELAYED_RELEASE_CAPSULE | Freq: Every day | ORAL | 0 refills | Status: DC
  Filled 2023-07-27: qty 30, 30d supply, fill #0
  Filled 2023-08-27: qty 30, 30d supply, fill #1
  Filled 2023-09-29: qty 30, 30d supply, fill #2

## 2023-07-28 ENCOUNTER — Ambulatory Visit: Payer: BC Managed Care – PPO | Admitting: Psychology

## 2023-07-28 DIAGNOSIS — F4322 Adjustment disorder with anxiety: Secondary | ICD-10-CM | POA: Diagnosis not present

## 2023-07-28 DIAGNOSIS — F332 Major depressive disorder, recurrent severe without psychotic features: Secondary | ICD-10-CM | POA: Diagnosis not present

## 2023-07-28 DIAGNOSIS — F339 Major depressive disorder, recurrent, unspecified: Secondary | ICD-10-CM

## 2023-07-28 NOTE — Progress Notes (Signed)
 Date: 07/28/2023  Treatment Plan Diagnosis 296.33 (Major depressive affective disorder, recurrent episode, severe, without mention of psychotic behavior) [n/a]  F43.22 (Adjustment Disorder, With anxiety) [n/a]  Symptoms One partner engages in sexual behavior (e.g., penile-vaginal intercourse, oral sex, anal sex) that violates the explicit or implicit expectations of the relationship. (Status: maintained) -- No Description Entered  Medication Status compliance  Safety none  If Suicidal or Homicidal State Action Taken: unspecified  Current Risk: low Medications unspecified Objectives Related Problem: Partners verbally express empathy toward each other and demonstrate shared responsibility for reconstructing the relationship. Description: Clarify what level of trust exists for various aspects of the relationship. Target Date: 2024-05-27 Frequency: Daily Modality: individual Progress: 80%  Related Problem: Partners verbally express empathy toward each other and demonstrate shared responsibility for reconstructing the relationship. Description: List relationship issues that contribute to dissatisfaction and unhappiness and agree to address these in future conjoint sessions. Target Date: 2024-05-27 Frequency: Daily Modality: individual Progress: 75%  Related Problem: Partners verbally express empathy toward each other and demonstrate shared responsibility for reconstructing the relationship. Description: Identify and list behavioral changes for self and partner that would enhance the relationship. Target Date: 2024-05-27 Frequency: Daily Modality: individual Progress: 85%  Related Problem: Partners verbally express empathy toward each other and  demonstrate shared responsibility for reconstructing the relationship. Description: Describe pre-affair relationship functioning. Target Date: 2024-05-27 Frequency: Daily Modality: individual Progress: 90%  Related Problem: Partners verbally express empathy toward each other and demonstrate shared responsibility for reconstructing the relationship. Description: Ask the partners to generate factors that they believe contributed to the affair. Target Date: 2024-05-27 Frequency: Daily Modality: individual Progress: 90%   Problem: Partners verbally express empathy toward each other and demonstrate shared responsibility for reconstructing the relationship. Description: Hurt partner uses anxiety-management techniques to deal with intrusive thoughts about the affair. Target Date: 2024-05-27 Frequency: Daily Modality: individual Progress: 65%  Client Response full compliance  Service Location Location, [address] [place], [place] [address]  Service Code cpt [phone]  Self care activities  Provide education, information  Emotion regulation skills  Validate/empathize  Identify/label emotions  Facilitate problem solving  Comments  Patient agrees to a video (Caregility) session and understands the limits of this platform. Patient is at home and provider is in home office.   Dx.: Depression  Meds: Xanax. Tried antidep. meds, but she has significant side-effects. Goals: They are seeking counseling to resolve significant marital conflict secondary to infidelity. [name] has experienced symptoms of both anxiety and depression during this period and is hoping to reduce these symptoms as the relationship issues improve. [name] is experiencing some depressive symptoms as well, along with considerable anger and frustration. Goal date 12-25   [name] and [name]: [name] is sick with the flu. She is in [place] and  has not yet been to the doctor. They are continuing to be upset with their kids  feeling that they have somehow "damaged them. Son is completely irresponsible and daughter is disconnected. They have gotten along better this past week even though they disagree about managing the kids.                                                                           [name], Ph.D.  Time 5:10p-6:00p 50 minutes

## 2023-07-29 ENCOUNTER — Telehealth: Payer: BC Managed Care – PPO | Admitting: Family Medicine

## 2023-07-29 ENCOUNTER — Encounter: Payer: Self-pay | Admitting: Family Medicine

## 2023-07-29 ENCOUNTER — Ambulatory Visit: Payer: Self-pay | Admitting: Family Medicine

## 2023-07-29 DIAGNOSIS — J111 Influenza due to unidentified influenza virus with other respiratory manifestations: Secondary | ICD-10-CM | POA: Diagnosis not present

## 2023-07-29 MED ORDER — ALBUTEROL SULFATE HFA 108 (90 BASE) MCG/ACT IN AERS: 2.0000 | INHALATION_SPRAY | Freq: Four times a day (QID) | RESPIRATORY_TRACT | 2 refills | Status: AC | PRN

## 2023-07-29 MED ORDER — PREDNISONE 20 MG PO TABS: 40.0000 mg | ORAL_TABLET | Freq: Every day | ORAL | 0 refills | Status: DC

## 2023-07-29 MED ORDER — BENZONATATE 100 MG PO CAPS: ORAL_CAPSULE | ORAL | 0 refills | Status: DC

## 2023-07-29 NOTE — Telephone Encounter (Signed)
 Copied from CRM [id number]. Topic: Clinical - Red Word Triage >> Jul 29, 2023 12:16 PM [name] wrote: [facility] that prompted transfer to Nurse Triage: Patient is presenting with a cough, body aches, and feeling miserable. She was exposed to the flu by her grandson on Friday and began feeling sick on Sunday. Patient is requesting an acute virtual appointment as she does not want to go to the ED or expose anyone else. She is also requesting Tamiflu.   Chief Complaint: Flu Like symptoms--body aches, cough, congestion Symptoms: cough, congestion, body aches, bilateral ear pain, sore throat, headache Frequency: 3 days ago Pertinent Negatives: Patient denies difficulty breathing, nausea, vomiting, diarrhea Disposition: [] ED /[] Urgent Care (no appt availability in office) / [x] Appointment(In office/virtual)/ []  [facility]/ [] Home Care/ [] Refused Recommended Disposition /[] [place]/ []  Follow-up with PCP Additional Notes: Patient called and advised that her grandchildren and their mother have all been diagnosed with the Flu in the past few days. Patient advised that she is immunocompromised and she is also out of town in [place].  She denies any difficulty breathing, nausea, vomiting, diarrhea, and states that her chest feels sore when she coughs. She wanted to do a virtual video since her symptoms are exactly the same as her grandchildren's mother's symptoms.  She didn't want to go expose anyone else for in person visits.  This RN made her an appointment at her PCP office for a virtual visit today 07/29/2023 with Kriste Basque at 5:40 pm.  Patient was also advised that there is a chance that after speaking with a provider via a virtual visit that they may suggest a visit in person for further evaluation of symptoms.  Patient was okay with that being a possibility.  Patient is given Care Advice as per protocol and she is also advised that if she does get worse to go to the emergency room.   Patient verbalized understanding of this.    Reason for Disposition  [1] Continuous (nonstop) coughing interferes with work or school AND [2] no improvement using cough treatment per Care Advice  Answer Assessment - Initial Assessment Questions 1. TYPE of EXPOSURE: "How were you exposed?" (e.g., close contact, not a close contact)     Monday morning (3 days ago) 2. DATE of EXPOSURE: "When did the exposure occur?" (e.g., hour, days, weeks)     Saw family 2-3 days prior who all had Flu A 3. PREGNANCY: "Is there any chance you are pregnant?" "When was your last menstrual period?"     No--hysterectomy at age 48 4. HIGH RISK for COMPLICATIONS: "Do you have any heart or lung problems?" "Do you have a weakened immune system?" (e.g., CHF, COPD, asthma, HIV positive, chemotherapy, renal failure, diabetes mellitus, sickle cell anemia)     No 5. SYMPTOMS: "Do you have any symptoms?" (e.g., cough, fever, sore throat, difficulty breathing).     Cough, fever,  Answer Assessment - Initial Assessment Questions 1. ONSET: "When did the cough begin?"      2-3 days ago 2. SEVERITY: "How bad is the cough today?"      Awful 3. SPUTUM: "Describe the color of your sputum" (none, dry cough; clear, white, yellow, green)     Clear 4. HEMOPTYSIS: "Are you coughing up any blood?" If so ask: "How much?" (flecks, streaks, tablespoons, etc.)     No 5. DIFFICULTY BREATHING: "Are you having difficulty breathing?" If Yes, ask: "How bad is it?" (e.g., mild, moderate, severe)    -  MILD: No SOB at rest, mild SOB with walking, speaks normally in sentences, can lie down, no retractions, pulse < 100.    - MODERATE: SOB at rest, SOB with minimal exertion and prefers to sit, cannot lie down flat, speaks in phrases, mild retractions, audible wheezing, pulse 100-120.    - SEVERE: Very SOB at rest, speaks in single words, struggling to breathe, sitting hunched forward, retractions, pulse > 120      No 6. FEVER: "Do you have a  fever?" If Yes, ask: "What is your temperature, how was it measured, and when did it start?"     Yes  unsure exactly what the temperature is 7. CARDIAC HISTORY: "Do you have any history of heart disease?" (e.g., heart attack, congestive heart failure)      No 8. LUNG HISTORY: "Do you have any history of lung disease?"  (e.g., pulmonary embolus, asthma, emphysema)     No 9. PE RISK FACTORS: "Do you have a history of blood clots?" (or: recent major surgery, recent prolonged travel, bedridden)     Yes 2019 10. OTHER SYMPTOMS: "Do you have any other symptoms?" (e.g., runny nose, wheezing, chest pain)       Congestion, sore throat, both ears hurt, cough, body aches, headache 11. PREGNANCY: "Is there any chance you are pregnant?" "When was your last menstrual period?"       No--hysterectomy 12. TRAVEL: "Have you traveled out of the country in the last month?" (e.g., travel history, exposures)       No  Protocols used: Influenza (Flu) Exposure-A-AH, Cough - Acute Non-Productive-A-AH

## 2023-07-29 NOTE — Patient Instructions (Signed)
-  I sent the medication(s) we discussed to your pharmacy: Meds ordered this encounter  Medications   albuterol (VENTOLIN HFA) 108 (90 Base) MCG/ACT inhaler    Sig: Inhale 2 puffs into the lungs every 6 (six) hours as needed for wheezing or shortness of breath.    Dispense:  8 g    Refill:  2   benzonatate (TESSALON PERLES) 100 MG capsule    Sig: 1-2 capsules up to twice daily as needed for cough.    Dispense:  30 capsule    Refill:  0   predniSONE (DELTASONE) 20 MG tablet    Sig: Take 2 tablets (40 mg total) by mouth daily with breakfast.    Dispense:  8 tablet    Refill:  0     I hope you are feeling better soon!  Seek in person care promptly if your symptoms worsen, new concerns arise or you are not improving with treatment.  It was nice to meet you today. I help [facility] out with telemedicine visits on Tuesdays and Thursdays and am happy to help if you need a virtual follow up visit on those days. Otherwise, if you have any concerns or questions following this visit please schedule a follow up visit with your Primary Care office or seek care at a local urgent care clinic to avoid delays in care. If you are having severe or life threatening symptoms please call 911 and/or go to the nearest emergency room.

## 2023-07-29 NOTE — Progress Notes (Signed)
 Virtual Visit via Video Note  I connected with [name]  on 07/29/23 at  5:40 PM EST by a video enabled telemedicine application and verified that I am speaking with the correct person using two identifiers.  Location patient: [place] Location provider:work or home office Persons participating in the virtual visit: patient, provider  I discussed the limitations and requested verbal permission for telemedicine visit. The patient expressed understanding and agreed to proceed.   HPI:  Acute telemedicine visit for "flu": -Onset: exposed Friday, symptoms started 4 days ago -reports daughter and grandchildren all had confirmed flu (on test at dr office) and she was exposed, then she became sick -Symptoms include:cough, body aches, fever initially (fever now resolved), sore throat initially -cough is the worst symptom -has hx of bronchitis and has required prednisone in the past and is requesting prednisone today -reports has had to used inhalers in the past when sick, feels like needs inhaler - no SOB, but feels congestion -Denies: SOB, CP, persistent fevers, wheezing, chest tightness -Has tried: tylenol -Pertinent past medical history: see below, reports hx bronchitis, also reports hx hospitalization with covid -Pertinent medication allergies: Allergies  Allergen Reactions   Gadolinium Derivatives Swelling and Other (See Comments)    Arm swelling, tingling, & redness. Radiologist Dr. Grace Isaac recommends an injection of steroids, or 13-hour prep if non-emergent, if patient needs a gadolinium based contrast in the future.   Glycopyrrolate Other (See Comments)    Inability to pass urine    Keflex [Cephalexin] Photosensitivity   Other Rash and Other (See Comments)    Glue or tape to seal up wound   Zoloft [Sertraline Hcl] Other (See Comments)    Hair loss  -did not have the flu shot - she gets sick when she gets her flu shots Immunization History  Administered Date(s) Administered    Influenza-Unspecified 04/20/2018   PFIZER(Purple Top)SARS-COV-2 Vaccination 08/18/2019, 12/26/2019   Td 04/07/2007     ROS: See pertinent positives and negatives per HPI.  Past Medical History:  Diagnosis Date   Allergy    Anxiety    Arthritis    B12 deficiency    Chronic narcotic use    Depression    not currently    Dysrhythmia    hx tachy-was from medication nortriptylline   Esophagitis    rx   Fibromyalgia    GERD (gastroesophageal reflux disease)    History of echocardiogram    Echo 4/18: Mild concentric LVH, vigorous LVF, EF 65-70, normal wall motion, grade 1 diastolic dysfunction   History of kidney stones    Hyperlipidemia    Intervertebral disc protrusion 01/11/2014   Migraine    Obesity (BMI 35.0-39.9 without comorbidity)    Peripheral neuropathy    Polycystic ovaries    Pulmonary embolus (HCC)    Spinal stenosis, lumbar    Vitamin D deficiency     Past Surgical History:  Procedure Laterality Date   24 HOUR PH STUDY N/A 05/27/2016   Procedure: 24 HOUR PH STUDY;  Surgeon: Ruffin Frederick, MD;  Location: [facility] ENDOSCOPY;  Service: Gastroenterology;  Laterality: N/A;   ABDOMINAL EXPOSURE N/A 05/04/2018   Procedure: ABDOMINAL EXPOSURE;  Surgeon: Larina Earthly, MD;  Location[facility] OR;  Service: Vascular;  Laterality: N/A;   ANTERIOR LUMBAR FUSION N/A 05/04/2018   Procedure: Lumbar Five Sacral One Anterior lumbar interbody fusion;  Surgeon: Donalee Citrin, MD;  Location: [facility] OR;  Service: Neurosurgery;  Laterality: N/A;  Lumbar Five Sacral One Anterior lumbar interbody fusion  APPENDECTOMY     BACK SURGERY  2012   left laminectomy at L3-4 per Dr. Phoebe Perch    bladder tack  2012   CHOLECYSTECTOMY N/A 10/03/2015   Procedure: LAPAROSCOPIC CHOLECYSTECTOMY;  Surgeon: Rodman Pickle, MD;  Location: [facility] OR;  Service: General;  Laterality: N/A;   ESOPHAGEAL MANOMETRY N/A 05/27/2016   Procedure: ESOPHAGEAL MANOMETRY (EM);  Surgeon: Ruffin Frederick, MD;  Location: [facility]  ENDOS[facility];  Service: Gastroenterology;  Laterality: N/A;   EXCISION OF SKIN TAG  10/03/2015   Procedure: EXCISION OF SKIN TAG;  Surgeon: De Blanch Kinsinger, MD;  Location: [facility] OR;  Service: General;;   LUMBAR DISC SURGERY  01-31-14   per Dr. [name]    OOPHORECTOMY     left overy   OVARIAN CYST REMOVAL     RECTOCELE REPAIR     SINUSOTOMY  12/14/06   spinal fusion  2016/10   TONSILLECTOMY     TONSILLECTOMY     VAGINAL HYSTERECTOMY     WRIST GANGLION EXCISION Left      Current Outpatient Medications:    albuterol (VENTOLIN HFA) 108 (90 Base) MCG/ACT inhaler, Inhale 2 puffs into the lungs every 6 (six) hours as needed for wheezing or shortness of breath., Disp: 8 g, Rfl: 2   ALPRAZolam (XANAX) 0.5 MG tablet, TAKE 1 TABLET BY MOUTH THREE TIMES A DAY AS NEEDED, Disp: 90 tablet, Rfl: 5   amphetamine-dextroamphetamine (ADDERALL XR) 30 MG 24 hr capsule, Take 1 capsule (30 mg total) by mouth daily., Disp: 30 capsule, Rfl: 0   amphetamine-dextroamphetamine (ADDERALL XR) 30 MG 24 hr capsule, Take 1 capsule (30 mg total) by mouth daily., Disp: 30 capsule, Rfl: 0   amphetamine-dextroamphetamine (ADDERALL XR) 30 MG 24 hr capsule, Take 1 capsule (30 mg total) by mouth daily., Disp: 30 capsule, Rfl: 0   [START ON 08/07/2023] amphetamine-dextroamphetamine (ADDERALL XR) 30 MG 24 hr capsule, Take 1 capsule (30 mg total) by mouth daily., Disp: 30 capsule, Rfl: 0   atorvastatin (LIPITOR) 20 MG tablet, Take 1 tablet (20 mg total) by mouth daily., Disp: 90 tablet, Rfl: 3   B-D 3CC LUER-LOK SYR 25GX1" 25G X 1" 3 ML MISC, USE AS DIRECTED, Disp: 9 each, Rfl: 2   benzonatate (TESSALON PERLES) 100 MG capsule, 1-2 capsules up to twice daily as needed for cough., Disp: 30 capsule, Rfl: 0   cetirizine (ZYRTEC) 10 MG tablet, Take 1 tablet (10 mg total) by mouth daily., Disp: 90 tablet, Rfl: 3   clobetasol (TEMOVATE) 0.05 % external solution, clobetasol 0.05 % scalp solution, Disp: , Rfl:    cyanocobalamin (VITAMIN B12) 1000  MCG/ML injection, Inject 1 mL (1,000 mcg total) into the muscle once a week., Disp: 30 mL, Rfl: 11   cyclobenzaprine (FLEXERIL) 10 MG tablet, Take 1 tablet (10 mg total) by mouth 3 (three) times daily as needed for muscle spasms., Disp: 90 tablet, Rfl: 5   esomeprazole (NEXIUM) 40 MG capsule, Take 1 capsule (40 mg total) by mouth daily., Disp: 90 capsule, Rfl: 0   famotidine (PEPCID) 40 MG tablet, Take 1 tablet (40 mg total) by mouth at bedtime., Disp: 90 tablet, Rfl: 1   fluconazole (DIFLUCAN) 150 MG tablet, TAKE 1 TABLET BY MOUTH EVERY DAY, Disp: 5 tablet, Rfl: 5   Fluocinolone Acetonide Body 0.01 % OIL, fluocinolone 0.01 % scalp oil and shower cap, Disp: , Rfl:    fluticasone (FLONASE) 50 MCG/ACT nasal spray, Place 2 sprays into both nostrils daily., Disp: 16 g, Rfl: 6  gabapentin (NEURONTIN) 300 MG capsule, Take 1 capsule (300 mg total) by mouth 3 (three) times daily., Disp: 270 capsule, Rfl: 1   hydrochlorothiazide (HYDRODIURIL) 25 MG tablet, Take 1 tablet (25 mg total) by mouth daily., Disp: 90 tablet, Rfl: 0   HYDROmorphone (DILAUDID) 8 MG tablet, Take 1 tablet (8 mg) by mouth at bedtime., Disp: 30 tablet, Rfl: 0   HYDROmorphone (DILAUDID) 8 MG tablet, Take 1 tablet (8 mg) by mouth at bedtime., Disp: 30 tablet, Rfl: 0   [START ON 08/07/2023] HYDROmorphone (DILAUDID) 8 MG tablet, Take 1 tablet (8 mg) by mouth at bedtime. 02/25/23, Disp: 30 tablet, Rfl: 0   lidocaine (LIDODERM) 5 %, Place 1 patch onto the skin daily. Remove & Discard patch within 12 hours or as directed by MD, Disp: 30 patch, Rfl: 5   Multiple Vitamins-Minerals (MULTI-VITAMIN GUMMIES PO), Take 2 tablets by mouth daily., Disp: , Rfl:    oxyCODONE (ROXICODONE) 15 MG immediate release tablet, Take 1 tablet (15 mg total) by mouth every 4 (four) hours as needed for pain., Disp: 180 tablet, Rfl: 0   oxyCODONE (ROXICODONE) 15 MG immediate release tablet, Take 1 tablet (15 mg total) by mouth every 4 (four) hours as needed for pain., Disp:  180 tablet, Rfl: 0   [START ON 08/07/2023] oxyCODONE (ROXICODONE) 15 MG immediate release tablet, Take 1 tablet (15 mg total) by mouth every 4 (four) hours as needed for pain., Disp: 180 tablet, Rfl: 0   phenazopyridine (PYRIDIUM) 100 MG tablet, Take 1 tablet (100 mg total) by mouth 3 (three) times daily as needed for pain., Disp: 10 tablet, Rfl: 0   potassium chloride (KLOR-CON 10) 10 MEQ tablet, Take 1 tablet (10 mEq total) by mouth daily., Disp: 90 tablet, Rfl: 3   predniSONE (DELTASONE) 20 MG tablet, Take 2 tablets (40 mg total) by mouth daily with breakfast., Disp: 8 tablet, Rfl: 0   promethazine (PHENERGAN) 25 MG tablet, Take 1 tablet (25 mg total) by mouth every 4 (four) hours as needed for nausea or vomiting., Disp: 90 tablet, Rfl: 5   Semaglutide-Weight Management (WEGOVY) 2.4 MG/0.75ML SOAJ, Inject 2.4 mg into the skin once a week., Disp: 3 mL, Rfl: 5   sucralfate (CARAFATE) 1 g tablet, Take 1 tablet (1 g total) by mouth 4 (four) times daily., Disp: 120 tablet, Rfl: 2   SUMAtriptan (IMITREX) 100 MG tablet, Take 1 tablet earliest onset of migraine.  May repeat once in 2 hours if headache persists or recurs., Disp: 10 tablet, Rfl: 2   Vitamin D, Ergocalciferol, (DRISDOL) 1.25 MG (50000 UNIT) CAPS capsule, Take 1 capsule by mouth every 7 days., Disp: 12 capsule, Rfl: 3   potassium chloride SA (KLOR-CON M) 20 MEQ tablet, Take 2 tablets (40 mEq total) by mouth daily for 7 days., Disp: 14 tablet, Rfl: 0  EXAM:  VITALS per patient if applicable: report O2 97%  GENERAL: alert, oriented, appears well and in no acute distress  HEENT: atraumatic, conjunttiva clear, no obvious abnormalities on inspection of external nose and ears  NECK: normal movements of the head and neck  LUNGS: on inspection no signs of respiratory distress, breathing rate appears normal, no obvious gross SOB, gasping or wheezing  CV: no obvious cyanosis  MS: moves all visible extremities without noticeable  abnormality  PSYCH/NEURO: pleasant and cooperative, no obvious depression or anxiety, speech and thought processing grossly intact  ASSESSMENT AND PLAN:  Discussed the following assessment and plan:  Influenza  -we discussed possible serious and  likely etiologies, options for evaluation and workup, limitations of telemedicine visit vs in person visit, treatment, treatment risks and precautions. Pt is agreeable to treatment via telemedicine at this moment. Discussed treatment window for tamiflu and opted against. Sent Tessalon rx for cough, alb to use prn, prednisone to use if albuterol not helping after lengthy discussion risks vs benefits. Advised no dairy or added sweeteners/sugar while sick. Advised of potential complications and advised low threshhold to seek prompt virtual in person care if worsening, new symptoms arise, or if is not improving with treatment as expected per our conversation of expected course. Discussed options for follow up care. Did let this patient know that I do telemedicine on Tuesdays and Thursdays for  and those are the days I am logged into the system. Advised to schedule follow up visit with PCP, [place] virtual visits or [facility] if any further questions or concerns to avoid delays in care.   I discussed the assessment and treatment plan with the patient. The patient was provided an opportunity to ask questions and all were answered. The patient agreed with the plan and demonstrated an understanding of the instructions.     Terressa Koyanagi, DO

## 2023-07-30 NOTE — Telephone Encounter (Signed)
 Pt had a VV visit with Dr Selena Batten on 07/29/23

## 2023-08-02 ENCOUNTER — Other Ambulatory Visit (HOSPITAL_COMMUNITY): Payer: Self-pay

## 2023-08-02 ENCOUNTER — Other Ambulatory Visit: Payer: Self-pay

## 2023-08-03 ENCOUNTER — Ambulatory Visit: Payer: BC Managed Care – PPO | Admitting: Psychology

## 2023-08-03 DIAGNOSIS — F4322 Adjustment disorder with anxiety: Secondary | ICD-10-CM

## 2023-08-03 DIAGNOSIS — F339 Major depressive disorder, recurrent, unspecified: Secondary | ICD-10-CM | POA: Diagnosis not present
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
## 2023-08-05 ENCOUNTER — Other Ambulatory Visit: Payer: Self-pay

## 2023-08-05 ENCOUNTER — Emergency Department (HOSPITAL_BASED_OUTPATIENT_CLINIC_OR_DEPARTMENT_OTHER)
Admission: EM | Admit: 2023-08-05 | Discharge: 2023-08-06 | Disposition: A | Discharge status: Home / Self Care | Attending: Emergency Medicine | Admitting: Emergency Medicine

## 2023-08-05 ENCOUNTER — Encounter (HOSPITAL_BASED_OUTPATIENT_CLINIC_OR_DEPARTMENT_OTHER): Payer: Self-pay

## 2023-08-05 ENCOUNTER — Telehealth: Admitting: Family Medicine

## 2023-08-05 ENCOUNTER — Ambulatory Visit: Payer: Self-pay | Admitting: Family Medicine

## 2023-08-05 ENCOUNTER — Emergency Department (HOSPITAL_BASED_OUTPATIENT_CLINIC_OR_DEPARTMENT_OTHER): Admitting: Radiology

## 2023-08-05 DIAGNOSIS — M79662 Pain in left lower leg: Secondary | ICD-10-CM | POA: Diagnosis not present

## 2023-08-05 DIAGNOSIS — R0789 Other chest pain: Secondary | ICD-10-CM | POA: Diagnosis not present

## 2023-08-05 DIAGNOSIS — M79605 Pain in left leg: Secondary | ICD-10-CM

## 2023-08-05 DIAGNOSIS — R0602 Shortness of breath: Secondary | ICD-10-CM | POA: Diagnosis not present

## 2023-08-05 DIAGNOSIS — R0600 Dyspnea, unspecified: Secondary | ICD-10-CM | POA: Diagnosis not present

## 2023-08-05 DIAGNOSIS — Z87891 Personal history of nicotine dependence: Secondary | ICD-10-CM | POA: Diagnosis not present

## 2023-08-05 DIAGNOSIS — R079 Chest pain, unspecified: Secondary | ICD-10-CM | POA: Diagnosis not present

## 2023-08-05 DIAGNOSIS — J40 Bronchitis, not specified as acute or chronic: Secondary | ICD-10-CM | POA: Insufficient documentation

## 2023-08-05 LAB — CBC
HCT: 48 % — ABNORMAL HIGH (ref 36.0–46.0)
Hemoglobin: 17 g/dL — ABNORMAL HIGH (ref 12.0–15.0)
MCH: 29.7 pg (ref 26.0–34.0)
MCHC: 35.4 g/dL (ref 30.0–36.0)
MCV: 83.8 fL (ref 80.0–100.0)
Platelets: 366 10*3/uL (ref 150–400)
RBC: 5.73 MIL/uL — ABNORMAL HIGH (ref 3.87–5.11)
RDW: 12.6 % (ref 11.5–15.5)
WBC: 13.8 10*3/uL — ABNORMAL HIGH (ref 4.0–10.5)
nRBC: 0 % (ref 0.0–0.2)

## 2023-08-05 LAB — BASIC METABOLIC PANEL
Anion gap: 13 (ref 5–15)
BUN: 12 mg/dL (ref 6–20)
CO2: 24 mmol/L (ref 22–32)
Calcium: 9.4 mg/dL (ref 8.9–10.3)
Chloride: 99 mmol/L (ref 98–111)
Creatinine, Ser: 1.02 mg/dL — ABNORMAL HIGH (ref 0.44–1.00)
GFR, Estimated: >60 mL/min (ref >60)
Glucose, Bld: 116 mg/dL — ABNORMAL HIGH (ref 70–99)
Potassium: 3.3 mmol/L — ABNORMAL LOW (ref 3.5–5.1)
Sodium: 136 mmol/L (ref 135–145)

## 2023-08-05 LAB — TROPONIN I (HIGH SENSITIVITY): Troponin I (High Sensitivity): 3 ng/L (ref <18)

## 2023-08-05 LAB — D-DIMER, QUANTITATIVE: D-Dimer, Quant: <0.27 ug{FEU}/mL (ref 0.00–0.50)

## 2023-08-05 MED ORDER — SODIUM CHLORIDE 0.9 % IV BOLUS
1000.0000 mL | Freq: Once | INTRAVENOUS | Status: AC
  Administered 2023-08-05: 1000 mL via INTRAVENOUS

## 2023-08-05 MED ORDER — MORPHINE SULFATE (PF) 4 MG/ML IV SOLN
4.0000 mg | Freq: Once | INTRAVENOUS | Status: AC
  Administered 2023-08-05: 4 mg via INTRAVENOUS
  Filled 2023-08-05: qty 1

## 2023-08-05 NOTE — ED Triage Notes (Signed)
 Pt arrived POV for CP and SOB for several days, reports the CP is heaviness in chest, telehealth provider would like pt worked up for PNA or DVT. Pt also had the flu 2 weeks ago. Increase weakness since yesterday, some nausea. Recently taking prednisone and tessalon pearls. Pt also reporting left leg pain and chronic back pain, stating her regular pain regiment is not helping ease the pain. NAD noted at current, A&O x4, HR 128, but afebrile.

## 2023-08-05 NOTE — ED Provider Notes (Signed)
 [facility]ERGENCY [facility] Emergency Department Provider Note MRN:  [id number]  Arrival date & time: 08/06/23     Chief Complaint   Chest Pain   History of Present Illness   [name] is a 48 y.o. year-old female with a history of fibromyalgia presenting to the ED with chief complaint of chest pain.  Came down with likely the flu last week, had multiple family members tested positive.  Was feeling generally unwell with flulike symptoms, received a short course of steroids from virtual doctor and was feeling better.  Finished the steroids and now feeling worse again.  Having chest congestion and discomfort, feels short of breath.  Also having pain to the anterior left thigh.  This leg pain feels similar to prior episodes of herniated disks in her back.  Has minimal back pain at this time which is chronic for her.  Denies numbness or weakness to the arms or legs, no bowel or bladder dysfunction.  Not having fever, no sore throat, no ear pain, no abdominal pain, no vomiting or diarrhea.  Review of Systems  A thorough review of systems was obtained and all systems are negative except as noted in the HPI and PMH.   Patient's Health History    Past Medical History:  Diagnosis Date   Allergy    Anxiety    Arthritis    B12 deficiency    Chronic narcotic use    Depression    not currently    Dysrhythmia    hx tachy-was from medication nortriptylline   Esophagitis    rx   Fibromyalgia    GERD (gastroesophageal reflux disease)    History of echocardiogram    Echo 4/18: Mild concentric LVH, vigorous LVF, EF 65-70, normal wall motion, grade 1 diastolic dysfunction   History of kidney stones    Hyperlipidemia    Intervertebral disc protrusion 01/11/2014   Migraine    Obesity (BMI 35.0-39.9 without comorbidity)    Peripheral neuropathy    Polycystic ovaries    Pulmonary embolus (HCC)    Spinal stenosis, lumbar    Vitamin D deficiency     Past Surgical History:   Procedure Laterality Date   86 HOUR PH STUDY N/A 05/27/2016   Procedure: 24 HOUR PH STUDY;  Surgeon: Ruffin Frederick, MD;  Location: [facility];  Service: Gastroenterology;  Laterality: N/A;   ABDOMINAL EXPOSURE N/A 05/04/2018   Procedure: ABDOMINAL EXPOSURE;  Surgeon: Larina Earthly, MD;  Location: [facility] OR;  Service: Vascular;  Laterality: N/A;   ANTERIOR LUMBAR FUSION N/A 05/04/2018   Procedure: Lumbar Five Sacral One Anterior lumbar interbody fusion;  Surgeon: Donalee Citrin, MD;  Location: [facility] OR;  Service: Neurosurgery;  Laterality: N/A;  Lumbar Five Sacral One Anterior lumbar interbody fusion   APPENDECTOMY     BACK SURGERY  2012   left laminectomy at L3-4 per Dr. Phoebe Perch    bladder tack  2012   CHOLECYSTECTOMY N/A 10/03/2015   Procedure: LAPAROSCOPIC CHOLECYSTECTOMY;  Surgeon: Rodman Pickle, MD;  Location: [facility] OR;  Service: General;  Laterality: N/A;   ESOPHAGEAL MANOMETRY N/A 05/27/2016   Procedure: ESOPHAGEAL MANOMETRY (EM);  Surgeon: Ruffin Frederick, MD;  Location: [facility];  Service: Gastroenterology;  Laterality: N/A;   EXCISION OF SKIN TAG  10/03/2015   Procedure: EXCISION OF SKIN TAG;  Surgeon: De Blanch Kinsinger, MD;  Location: [facility] OR;  Service: General;;   LUMBAR DISC SURGERY  01-31-14   per Dr. Fernande Boyden  left overy   OVARIAN CYST REMOVAL     RECTOCELE REPAIR     SINUSOTOMY  12/14/06   spinal fusion  2016/10   TONSILLECTOMY     TONSILLECTOMY     VAGINAL HYSTERECTOMY     WRIST GANGLION EXCISION Left     Family History  Problem Relation Age of Onset   Fibromyalgia Mother    Diabetes Mother    Thyroid cancer Mother    Liver disease Mother    Neuropathy Mother    Cirrhosis Mother        non-alcoholic   Pulmonary embolism Mother        both lungs   Heart failure Mother    Hypertension Father    Diabetes Father    Heart disease Father    Prostate cancer Father    Heart attack Father        x 2   Colon cancer Neg Hx    Other Neg Hx         pheochromocytoma   Colon polyps Neg Hx     Social History   Socioeconomic History   Marital status: Married    Spouse name: Not on file   Number of children: 2   Years of education: Not on file   Highest education level: Not on file  Occupational History   Occupation: housewife  Tobacco Use   Smoking status: Former    Current packs/day: 0.00    Types: Cigarettes    Quit date: 1999    Years since quitting: 26.1   Smokeless tobacco: Never  Vaping Use   Vaping status: Never Used  Substance and Sexual Activity   Alcohol use: No    Alcohol/week: 0.0 standard drinks of alcohol   Drug use: No   Sexual activity: Yes    Birth control/protection: Surgical  Other Topics Concern   Not on file  Social History Narrative   Not on file   Social Drivers of Health   Financial Resource Strain: Not on file  Food Insecurity: No Food Insecurity (07/08/2022)   Hunger Vital Sign    Worried About Running Out of Food in the Last Year: Never true    Ran Out of Food in the Last Year: Never true  Transportation Needs: Not on file  Physical Activity: Not on file  Stress: Not on file  Social Connections: Not on file  Intimate Partner Violence: Not on file     Physical Exam   Vitals:   08/06/23 0300 08/06/23 0400  BP: 131/82 132/89  Pulse:  (!) 116  Resp: 13 (!) 21  Temp:    SpO2:  97%    CONSTITUTIONAL: Well-appearing, appears anxious and uncomfortable NEURO/PSYCH:  Alert and oriented x 3, no focal deficits EYES:  eyes equal and reactive ENT/NECK:  no LAD, no JVD CARDIO: Tachycardic rate, well-perfused, normal S1 and S2 PULM:  CTAB no wheezing or rhonchi GI/GU:  non-distended, non-tender MSK/SPINE:  No gross deformities, no edema SKIN:  no rash, atraumatic   *Additional and/or pertinent findings included in MDM below  Diagnostic and Interventional Summary    EKG Interpretation Date/Time:  Thursday August 05 2023 21:47:32 EST Ventricular Rate:  126 PR  Interval:  130 QRS Duration:  84 QT Interval:  316 QTC Calculation: 457 R Axis:   74  Text Interpretation: Sinus tachycardia with occasional Premature ventricular complexes Otherwise normal ECG When compared with ECG of 13-Sep-2019 06:40, Premature ventricular complexes are now Present Vent. rate has increased BY  47 BPM Confirmed by Kennis Carina [phone]) on 08/05/2023 11:09:57 PM       Labs Reviewed  BASIC METABOLIC PANEL - Abnormal; Notable for the following components:      Result Value   Potassium 3.3 (*)    Glucose, Bld 116 (*)    Creatinine, Ser 1.02 (*)    All other components within normal limits  CBC - Abnormal; Notable for the following components:   WBC 13.8 (*)    RBC 5.73 (*)    Hemoglobin 17.0 (*)    HCT 48.0 (*)    All other components within normal limits  D-DIMER, QUANTITATIVE  TROPONIN I (HIGH SENSITIVITY)    CT Chest Wo Contrast  Final Result    CT Lumbar Spine Wo Contrast  Final Result    DG Chest 2 View  Final Result      Medications  morphine (PF) 4 MG/ML injection 4 mg (has no administration in time range)  sodium chloride 0.9 % bolus 1,000 mL (0 mLs Intravenous Stopped 08/06/23 0310)  morphine (PF) 4 MG/ML injection 4 mg (4 mg Intravenous Given 08/05/23 2359)  dexamethasone (DECADRON) injection 10 mg (10 mg Intravenous Given 08/06/23 0129)  morphine (PF) 4 MG/ML injection 4 mg (4 mg Intravenous Given 08/06/23 0128)     Procedures  /  Critical Care Procedures  ED Course and Medical Decision Making  Initial Impression and Ddx Overall patient seems a bit anxious, seems uncomfortable due to her leg pain.  No obvious pathology to the leg, normal range of motion, neurovascularly intact.  No trauma to warrant imaging.  Not having significant back pain, no signs or symptoms of myelopathy.  No erythema or increased warmth, no edema, overall very low concern for DVT.  Patient is having chest discomfort and shortness of breath however and so VTE is considered.   Patient may have secondary bacterial pneumonia or simply rebound symptoms after finishing her prednisone.  Past medical/surgical history that increases complexity of ED encounter: Fibromyalgia  Interpretation of Diagnostics I personally reviewed the EKG and my interpretation is as follows: Sinus rhythm, mild tachycardia  No significant blood count or electrolyte disturbance.  Some possible hemoconcentration.  Troponin negative, D-dimer negative.  Patient Reassessment and Ultimate Disposition/Management     CT imaging obtained after x-ray showing possible nodule abnormality per radiology.  CT is largely unremarkable, unchanged from prior.  Patient was concerned about her lower back and so CT lumbar was also obtained after shared decision making, also unremarkable.  Patient is now comfortable, tachycardia is improved, on my evaluation down to 98.  Appropriate for discharge.  Patient management required discussion with the following services or consulting groups:  None  Complexity of Problems Addressed Acute illness or injury that poses threat of life of bodily function  Additional Data Reviewed and Analyzed Further history obtained from: Further history from spouse/family member  Additional Factors Impacting ED Encounter Risk Consideration of hospitalization  Elmer Sow. Pilar Plate, MD [facility] Emergency Medicine [facility] [email or web address]  Final Clinical Impressions(s) / ED Diagnoses     ICD-10-CM   1. Bronchitis  J40     2. Pain of left anterior lower extremity  M79.605       ED Discharge Orders     None        Discharge Instructions Discussed with and Provided to Patient:     Discharge Instructions      You were evaluated in the Emergency Department and after careful evaluation, we  did not find any emergent condition requiring admission or further testing in the hospital.  Your exam/testing today was overall reassuring.  Recommend follow-up  with your regular doctors to discuss your symptoms.  Please return to the Emergency Department if you experience any worsening of your condition.  Thank you for allowing [place] to be a part of your care.        Sabas Sous, MD 08/06/23 [phone]

## 2023-08-05 NOTE — Progress Notes (Signed)
 Virtual Visit via Video Note  I connected with [name]  on 08/05/23 at  6:20 PM EST by a video enabled telemedicine application and verified that I am speaking with the correct person using two identifiers.  Location patient: [place] Location provider:work or home office Persons participating in the virtual visit: patient, provider  I discussed the limitations and requested verbal permission for telemedicine visit. The patient expressed understanding and agreed to proceed.   HPI:  Acute telemedicine visit for flu symptoms and leg pain : -Onset: came down with the flu 1.5 -2 weeks ago, was treated with alb and prednisone and was improving and feeling better, but then got worse 2 days ago with chest heaviness, fever returned (she thinks, does not have thermometer)feels winded, thick green mucus, fatigue, also developed pain in the L leg that is severe -she is concerned for blood clots or pneumonia as has had both in the past -Pertinent past medical history: see below, covid, asthma, PE -Pertinent medication allergies: Allergies  Allergen Reactions   Gadolinium Derivatives Swelling and Other (See Comments)    Arm swelling, tingling, & redness. Radiologist Dr. Grace Isaac recommends an injection of steroids, or 13-hour prep if non-emergent, if patient needs a gadolinium based contrast in the future.   Glycopyrrolate Other (See Comments)    Inability to pass urine    Keflex [Cephalexin] Photosensitivity   Other Rash and Other (See Comments)    Glue or tape to seal up wound   Zoloft [Sertraline Hcl] Other (See Comments)    Hair loss   -COVID-19 vaccine status:  Immunization History  Administered Date(s) Administered   Influenza-Unspecified 04/20/2018   PFIZER(Purple Top)SARS-COV-2 Vaccination 08/18/2019, 12/26/2019   Td 04/07/2007     ROS: See pertinent positives and negatives per HPI.  Past Medical History:  Diagnosis Date   Allergy    Anxiety    Arthritis    B12 deficiency    Chronic  narcotic use    Depression    not currently    Dysrhythmia    hx tachy-was from medication nortriptylline   Esophagitis    rx   Fibromyalgia    GERD (gastroesophageal reflux disease)    History of echocardiogram    Echo 4/18: Mild concentric LVH, vigorous LVF, EF 65-70, normal wall motion, grade 1 diastolic dysfunction   History of kidney stones    Hyperlipidemia    Intervertebral disc protrusion 01/11/2014   Migraine    Obesity (BMI 35.0-39.9 without comorbidity)    Peripheral neuropathy    Polycystic ovaries    Pulmonary embolus (HCC)    Spinal stenosis, lumbar    Vitamin D deficiency     Past Surgical History:  Procedure Laterality Date   44 HOUR PH STUDY N/A 05/27/2016   Procedure: 24 HOUR PH STUDY;  Surgeon: Ruffin Frederick, MD;  Location: [facility];  Service: Gastroenterology;  Laterality: N/A;   ABDOMINAL EXPOSURE N/A 05/04/2018   Procedure: ABDOMINAL EXPOSURE;  Surgeon: Larina Earthly, MD;  Location: [facility] OR;  Service: Vascular;  Laterality: N/A;   ANTERIOR LUMBAR FUSION N/A 05/04/2018   Procedure: Lumbar Five Sacral One Anterior lumbar interbody fusion;  Surgeon: Donalee Citrin, MD;  Location: [facility] OR;  Service: Neurosurgery;  Laterality: N/A;  Lumbar Five Sacral One Anterior lumbar interbody fusion   APPENDECTOMY     BACK SURGERY  2012   left laminectomy at L3-4 per Dr. Phoebe Perch    bladder tack  2012   CHOLECYSTECTOMY N/A 10/03/2015   Procedure: LAPAROSCOPIC CHOLECYSTECTOMY;  Surgeon: Rodman Pickle, MD;  Location: [facility] OR;  Service: General;  Laterality: N/A;   ESOPHAGEAL MANOMETRY N/A 05/27/2016   Procedure: ESOPHAGEAL MANOMETRY (EM);  Surgeon: Ruffin Frederick, MD;  Location: [facility];  Service: Gastroenterology;  Laterality: N/A;   EXCISION OF SKIN TAG  10/03/2015   Procedure: EXCISION OF SKIN TAG;  Surgeon: De Blanch Kinsinger, MD;  Location: [facility] OR;  Service: General;;   LUMBAR DISC SURGERY  01-31-14   per Dr. [name]    OOPHORECTOMY     left overy    OVARIAN CYST REMOVAL     RECTOCELE REPAIR     SINUSOTOMY  12/14/06   spinal fusion  2016/10   TONSILLECTOMY     TONSILLECTOMY     VAGINAL HYSTERECTOMY     WRIST GANGLION EXCISION Left      Current Outpatient Medications:    albuterol (VENTOLIN HFA) 108 (90 Base) MCG/ACT inhaler, Inhale 2 puffs into the lungs every 6 (six) hours as needed for wheezing or shortness of breath., Disp: 8 g, Rfl: 2   ALPRAZolam (XANAX) 0.5 MG tablet, TAKE 1 TABLET BY MOUTH THREE TIMES A DAY AS NEEDED, Disp: 90 tablet, Rfl: 5   amphetamine-dextroamphetamine (ADDERALL XR) 30 MG 24 hr capsule, Take 1 capsule (30 mg total) by mouth daily., Disp: 30 capsule, Rfl: 0   amphetamine-dextroamphetamine (ADDERALL XR) 30 MG 24 hr capsule, Take 1 capsule (30 mg total) by mouth daily., Disp: 30 capsule, Rfl: 0   amphetamine-dextroamphetamine (ADDERALL XR) 30 MG 24 hr capsule, Take 1 capsule (30 mg total) by mouth daily., Disp: 30 capsule, Rfl: 0   [START ON 08/07/2023] amphetamine-dextroamphetamine (ADDERALL XR) 30 MG 24 hr capsule, Take 1 capsule (30 mg total) by mouth daily., Disp: 30 capsule, Rfl: 0   atorvastatin (LIPITOR) 20 MG tablet, Take 1 tablet (20 mg total) by mouth daily., Disp: 90 tablet, Rfl: 3   B-D 3CC LUER-LOK SYR 25GX1" 25G X 1" 3 ML MISC, USE AS DIRECTED, Disp: 9 each, Rfl: 2   benzonatate (TESSALON PERLES) 100 MG capsule, 1-2 capsules up to twice daily as needed for cough., Disp: 30 capsule, Rfl: 0   cetirizine (ZYRTEC) 10 MG tablet, Take 1 tablet (10 mg total) by mouth daily., Disp: 90 tablet, Rfl: 3   clobetasol (TEMOVATE) 0.05 % external solution, clobetasol 0.05 % scalp solution, Disp: , Rfl:    cyanocobalamin (VITAMIN B12) 1000 MCG/ML injection, Inject 1 mL (1,000 mcg total) into the muscle once a week., Disp: 30 mL, Rfl: 11   cyclobenzaprine (FLEXERIL) 10 MG tablet, Take 1 tablet (10 mg total) by mouth 3 (three) times daily as needed for muscle spasms., Disp: 90 tablet, Rfl: 5   esomeprazole (NEXIUM) 40 MG  capsule, Take 1 capsule (40 mg total) by mouth daily., Disp: 90 capsule, Rfl: 0   famotidine (PEPCID) 40 MG tablet, Take 1 tablet (40 mg total) by mouth at bedtime., Disp: 90 tablet, Rfl: 1   fluconazole (DIFLUCAN) 150 MG tablet, TAKE 1 TABLET BY MOUTH EVERY DAY, Disp: 5 tablet, Rfl: 5   Fluocinolone Acetonide Body 0.01 % OIL, fluocinolone 0.01 % scalp oil and shower cap, Disp: , Rfl:    fluticasone (FLONASE) 50 MCG/ACT nasal spray, Place 2 sprays into both nostrils daily., Disp: 16 g, Rfl: 6   gabapentin (NEURONTIN) 300 MG capsule, Take 1 capsule (300 mg total) by mouth 3 (three) times daily., Disp: 270 capsule, Rfl: 1   hydrochlorothiazide (HYDRODIURIL) 25 MG tablet, Take 1 tablet (25 mg  total) by mouth daily., Disp: 90 tablet, Rfl: 0   HYDROmorphone (DILAUDID) 8 MG tablet, Take 1 tablet (8 mg) by mouth at bedtime., Disp: 30 tablet, Rfl: 0   HYDROmorphone (DILAUDID) 8 MG tablet, Take 1 tablet (8 mg) by mouth at bedtime., Disp: 30 tablet, Rfl: 0   [START ON 08/07/2023] HYDROmorphone (DILAUDID) 8 MG tablet, Take 1 tablet (8 mg) by mouth at bedtime. 02/25/23, Disp: 30 tablet, Rfl: 0   lidocaine (LIDODERM) 5 %, Place 1 patch onto the skin daily. Remove & Discard patch within 12 hours or as directed by MD, Disp: 30 patch, Rfl: 5   Multiple Vitamins-Minerals (MULTI-VITAMIN GUMMIES PO), Take 2 tablets by mouth daily., Disp: , Rfl:    oxyCODONE (ROXICODONE) 15 MG immediate release tablet, Take 1 tablet (15 mg total) by mouth every 4 (four) hours as needed for pain., Disp: 180 tablet, Rfl: 0   oxyCODONE (ROXICODONE) 15 MG immediate release tablet, Take 1 tablet (15 mg total) by mouth every 4 (four) hours as needed for pain., Disp: 180 tablet, Rfl: 0   [START ON 08/07/2023] oxyCODONE (ROXICODONE) 15 MG immediate release tablet, Take 1 tablet (15 mg total) by mouth every 4 (four) hours as needed for pain., Disp: 180 tablet, Rfl: 0   phenazopyridine (PYRIDIUM) 100 MG tablet, Take 1 tablet (100 mg total) by mouth 3  (three) times daily as needed for pain., Disp: 10 tablet, Rfl: 0   potassium chloride (KLOR-CON 10) 10 MEQ tablet, Take 1 tablet (10 mEq total) by mouth daily., Disp: 90 tablet, Rfl: 3   potassium chloride SA (KLOR-CON M) 20 MEQ tablet, Take 2 tablets (40 mEq total) by mouth daily for 7 days., Disp: 14 tablet, Rfl: 0   predniSONE (DELTASONE) 20 MG tablet, Take 2 tablets (40 mg total) by mouth daily with breakfast., Disp: 8 tablet, Rfl: 0   promethazine (PHENERGAN) 25 MG tablet, Take 1 tablet (25 mg total) by mouth every 4 (four) hours as needed for nausea or vomiting., Disp: 90 tablet, Rfl: 5   Semaglutide-Weight Management (WEGOVY) 2.4 MG/0.75ML SOAJ, Inject 2.4 mg into the skin once a week., Disp: 3 mL, Rfl: 5   sucralfate (CARAFATE) 1 g tablet, Take 1 tablet (1 g total) by mouth 4 (four) times daily., Disp: 120 tablet, Rfl: 2   SUMAtriptan (IMITREX) 100 MG tablet, Take 1 tablet earliest onset of migraine.  May repeat once in 2 hours if headache persists or recurs., Disp: 10 tablet, Rfl: 2   Vitamin D, Ergocalciferol, (DRISDOL) 1.25 MG (50000 UNIT) CAPS capsule, Take 1 capsule by mouth every 7 days., Disp: 12 capsule, Rfl: 3  EXAM:  VITALS per patient if applicable:  GENERAL: alert, oriented, appears to not feel well  HEENT: atraumatic, conjunttiva clear, no obvious abnormalities on inspection of external nose and ears  NECK: normal movements of the head and neck  LUNGS: on inspection no signs of respiratory distress, breathing rate appears normal, no obvious gross SOB, gasping or wheezing  CV: no obvious cyanosis  MS: moves all visible extremities without noticeable abnormality  PSYCH/NEURO: pleasant and cooperative, no obvious depression or anxiety, speech and thought processing grossly intact  ASSESSMENT AND PLAN:  Discussed the following assessment and plan:  Chest discomfort  Dyspnea, unspecified type  -we discussed possible serious and likely etiologies, options for  evaluation and workup, limitations of telemedicine visit vs in person visit, treatment, treatment risks and precautions. Given chest discomfort and dyspnea advise prompt evaluation tonight inperson. Discussed options and she  agrees to go right away to the one of the Med centers near her. She declines EMS.     I discussed the assessment and treatment plan with the patient. The patient was provided an opportunity to ask questions and all were answered. The patient agreed with the plan and demonstrated an understanding of the instructions.     Terressa Koyanagi, DO

## 2023-08-05 NOTE — Patient Instructions (Signed)
 Please go directly to the [facility] for inperson evaluation.

## 2023-08-05 NOTE — Telephone Encounter (Signed)
 Chief Complaint: fatigue Symptoms: fatigue, green mucus, chest congestion, cough Frequency: 2/27 or before Pertinent Negatives: Patient denies CP, SOB Disposition: [] ED /[] Urgent Care (no appt availability in office) / [x] Appointment(In office/virtual)/ []  [place] [facility]/ [] Home Care/ [] Refused Recommended Disposition /[] [place] [facility]/ []  Follow-up with PCP Additional Notes: Pt reports cough, green mucus, fatigue, and chest congestion. States she contracted Flu A from her grandchildren. Had a virtual visit on 2/27. Was prescribed an inhaler which she states she is using and it is helping. Pt originally called in reporting SOB, but then denied SOB to this RN. Pt states she is not SOB, she is just fatigued and exhausted. Pt states her nasal drainage used to be clear, now it is green. Pt endorses intermittent fevers as well. Pt states she does not have transportation at this time because her spouse is working out of town and her car is not working. RN scheduled pt for a virtual visit today at 1820 with Dr. Selena Batten. RN advised pt if she has CP or SOB before that appt she needs to call 911, but for other worsening she should call us back, to which she verbalized understanding.   Copied from CRM [id number]. Topic: Clinical - Red Word Triage >> Aug 05, 2023  3:19 PM [name] wrote: Red Word that prompted transfer to Nurse Triage: Shortness of breath, Feel exhausted/body fatigue/very tired. Still has cough. Feels worse Reason for Disposition  SEVERE coughing spells (e.g., whooping sound after coughing, vomiting after coughing)  Answer Assessment - Initial Assessment Questions .  Answer Assessment - Initial Assessment Questions 1. ONSET: "When did the cough begin?"      2/27 or before then 2. SEVERITY: "How bad is the cough today?"      "Cough is less" 3. SPUTUM: "Describe the color of your sputum" (none, dry cough; clear, white, yellow, green)     Green 4. HEMOPTYSIS: "Are you coughing up  any blood?" If so ask: "How much?" (flecks, streaks, tablespoons, etc.)     No 5. DIFFICULTY BREATHING: "Are you having difficulty breathing?" If Yes, ask: "How bad is it?" (e.g., mild, moderate, severe)    - MILD: No SOB at rest, mild SOB with walking, speaks normally in sentences, can lie down, no retractions, pulse < 100.    - MODERATE: SOB at rest, SOB with minimal exertion and prefers to sit, cannot lie down flat, speaks in phrases, mild retractions, audible wheezing, pulse 100-120.    - SEVERE: Very SOB at rest, speaks in single words, struggling to breathe, sitting hunched forward, retractions, pulse > 120      No 6. FEVER: "Do you have a fever?" If Yes, ask: "What is your temperature, how was it measured, and when did it start?"     Intermittent fevers 7. CARDIAC HISTORY: "Do you have any history of heart disease?" (e.g., heart attack, congestive heart failure)      HTN, PE, SVT 8. LUNG HISTORY: "Do you have any history of lung disease?"  (e.g., pulmonary embolus, asthma, emphysema)     COVID, pneumonia, PE 9. PE RISK FACTORS: "Do you have a history of blood clots?" (or: recent major surgery, recent prolonged travel, bedridden)     Yes, hx of PE after spinal surgery  Protocols used: Breathing Difficulty-A-AH, Cough - Acute Productive-A-AH

## 2023-08-06 ENCOUNTER — Emergency Department (HOSPITAL_BASED_OUTPATIENT_CLINIC_OR_DEPARTMENT_OTHER)

## 2023-08-06 DIAGNOSIS — R911 Solitary pulmonary nodule: Secondary | ICD-10-CM | POA: Diagnosis not present

## 2023-08-06 DIAGNOSIS — M545 Low back pain, unspecified: Secondary | ICD-10-CM | POA: Diagnosis not present

## 2023-08-06 DIAGNOSIS — R079 Chest pain, unspecified: Secondary | ICD-10-CM | POA: Diagnosis not present

## 2023-08-06 DIAGNOSIS — R0600 Dyspnea, unspecified: Secondary | ICD-10-CM | POA: Diagnosis not present

## 2023-08-06 DIAGNOSIS — R918 Other nonspecific abnormal finding of lung field: Secondary | ICD-10-CM | POA: Diagnosis not present

## 2023-08-06 MED ORDER — MORPHINE SULFATE (PF) 4 MG/ML IV SOLN
4.0000 mg | Freq: Once | INTRAVENOUS | Status: AC
  Administered 2023-08-06: 4 mg via INTRAVENOUS
  Filled 2023-08-06: qty 1

## 2023-08-06 MED ORDER — DEXAMETHASONE SODIUM PHOSPHATE 10 MG/ML IJ SOLN
10.0000 mg | Freq: Once | INTRAMUSCULAR | Status: AC
  Administered 2023-08-06: 10 mg via INTRAVENOUS
  Filled 2023-08-06: qty 1

## 2023-08-06 NOTE — Telephone Encounter (Signed)
 Pt was seen at the ED on 08/05/23

## 2023-08-06 NOTE — Discharge Instructions (Addendum)
 You were evaluated in the Emergency Department and after careful evaluation, we did not find any emergent condition requiring admission or further testing in the hospital.  Your exam/testing today was overall reassuring.  Recommend follow-up with your regular doctors to discuss your symptoms.  Please return to the Emergency Department if you experience any worsening of your condition.  Thank you for allowing [place] to be a part of your care.

## 2023-08-07 ENCOUNTER — Other Ambulatory Visit (HOSPITAL_COMMUNITY): Payer: Self-pay

## 2023-08-07 ENCOUNTER — Other Ambulatory Visit (HOSPITAL_BASED_OUTPATIENT_CLINIC_OR_DEPARTMENT_OTHER): Payer: Self-pay

## 2023-08-09 ENCOUNTER — Telehealth: Payer: Self-pay

## 2023-08-09 NOTE — Transitions of Care (Post Inpatient/ED Visit) (Unsigned)
   08/09/2023  Name: [name] MRN: [id number] DOB: May 23, 1976  Today's TOC FU Call Status: Today's TOC FU Call Status:: Unsuccessful Call (1st Attempt) Unsuccessful Call (1st Attempt) Date: 08/09/23  Attempted to reach the patient regarding the most recent Inpatient/ED visit.  Follow Up Plan: Additional outreach attempts will be made to reach the patient to complete the Transitions of Care (Post Inpatient/ED visit) call.   Signature Karena Addison, LPN [facility] Nurse Health Advisor Direct Dial [phone]

## 2023-08-10 ENCOUNTER — Other Ambulatory Visit (HOSPITAL_COMMUNITY): Payer: Self-pay

## 2023-08-11 ENCOUNTER — Ambulatory Visit: Payer: BC Managed Care – PPO | Admitting: Psychology

## 2023-08-11 DIAGNOSIS — F4322 Adjustment disorder with anxiety: Secondary | ICD-10-CM | POA: Diagnosis not present

## 2023-08-11 DIAGNOSIS — F339 Major depressive disorder, recurrent, unspecified: Secondary | ICD-10-CM

## 2023-08-11 NOTE — Progress Notes (Signed)
 Date: 08/11/2023  Treatment Plan Diagnosis 296.33 (Major depressive affective disorder, recurrent episode, severe, without mention of psychotic behavior) [n/a]  F43.22 (Adjustment Disorder, With anxiety) [n/a]  Symptoms One partner engages in sexual behavior (e.g., penile-vaginal intercourse, oral sex, anal sex) that violates the explicit or implicit expectations of the relationship. (Status: maintained) -- No Description Entered  Medication Status compliance  Safety none  If Suicidal or Homicidal State Action Taken: unspecified  Current Risk: low Medications unspecified Objectives Related Problem: Partners verbally express empathy toward each other and demonstrate shared responsibility for reconstructing the relationship. Description: Clarify what level of trust exists for various aspects of the relationship. Target Date: 2024-05-27 Frequency: Daily Modality: individual Progress: 80%  Related Problem: Partners verbally express empathy toward each other and demonstrate shared responsibility for reconstructing the relationship. Description: List relationship issues that contribute to dissatisfaction and unhappiness and agree to address these in future conjoint sessions. Target Date: 2024-05-27 Frequency: Daily Modality: individual Progress: 75%  Related Problem: Partners verbally express empathy toward each other and demonstrate shared responsibility for reconstructing the relationship. Description: Identify and list behavioral changes for self and partner that would enhance the relationship. Target Date: 2024-05-27 Frequency: Daily Modality: individual Progress: 85%  Related  Problem: Partners verbally express empathy toward each other and demonstrate shared responsibility for reconstructing the relationship. Description: Describe pre-affair relationship functioning. Target Date: 2024-05-27 Frequency: Daily Modality: individual Progress: 90%  Related Problem: Partners verbally express empathy toward each other and demonstrate shared responsibility for reconstructing the relationship. Description: Ask the partners to generate factors that they believe contributed to the affair. Target Date: 2024-05-27 Frequency: Daily Modality: individual Progress: 90%   Problem: Partners verbally express empathy toward each other and demonstrate shared responsibility for reconstructing the relationship. Description: Hurt partner uses anxiety-management techniques to deal with intrusive thoughts about the affair. Target Date: 2024-05-27 Frequency: Daily Modality: individual Progress: 65%  Client Response full compliance  Service Location Location, [address] [place], [place] [address]  Service Code cpt [phone]  Self care activities  Provide education, information  Emotion regulation skills  Validate/empathize  Identify/label emotions  Facilitate problem solving  Comments  Patient agrees to a video (Caregility) session and understands the limits of this platform. Patient is at home and provider is in home office.   Dx.: Depression  Meds: Xanax. Tried antidep. meds, but she has significant side-effects. Goals: They are seeking counseling to resolve significant marital conflict secondary to infidelity. [name] has experienced symptoms of both anxiety and depression during this period and is hoping to reduce these symptoms as the relationship issues improve. [name] is experiencing  some depressive symptoms as well, along with considerable anger and frustration. Goal date 12-25   [name] and [name]: [name] ended up in emergency room last week and got steroid shot and  inhalers. [name] came home to take her to the hospital. She then went with [name] to [place] to get their truck for her to drive on Sunday. She ended up staying in [place], They are 'stuck" in a communication pattern that is dysfunctional and blame each other for things getting out of control. He maintains she triggers him by talking over him (which she frequently does in session). Pointed out to her that it needs to stop that pattern. Told [name] that he needs to be responsible for keeping himself from being abusive verbally or physical.                                                                              [name], Ph.D.  Time 5:10p-6:00p 50 minutes

## 2023-08-11 NOTE — Transitions of Care (Post Inpatient/ED Visit) (Signed)
   08/11/2023  Name: [name] MRN: [id number] DOB: 1975/08/13  Today's TOC FU Call Status: Today's TOC FU Call Status:: Unsuccessful Call (2nd Attempt) Unsuccessful Call (1st Attempt) Date: 08/09/23 Unsuccessful Call (2nd Attempt) Date: 08/11/23  Attempted to reach the patient regarding the most recent Inpatient/ED visit.  Follow Up Plan: Additional outreach attempts will be made to reach the patient to complete the Transitions of Care (Post Inpatient/ED visit) call.   Signature Karena Addison, LPN [facility] Nurse Health Advisor Direct Dial [phone]

## 2023-08-11 NOTE — Transitions of Care (Post Inpatient/ED Visit) (Signed)
   08/11/2023  Name: [name] MRN: [id number] DOB: Mar 10, 1976  Today's TOC FU Call Status: Today's TOC FU Call Status:: Unsuccessful Call (3rd Attempt) Unsuccessful Call (1st Attempt) Date: 08/09/23 Unsuccessful Call (2nd Attempt) Date: 08/11/23 Unsuccessful Call (3rd Attempt) Date: 08/11/23  Attempted to reach the patient regarding the most recent Inpatient/ED visit.  Follow Up Plan: No further outreach attempts will be made at this time. We have been unable to contact the patient.  Signature Karena Addison, LPN [facility] [facility] Nurse Health Advisor Direct Dial [phone]

## 2023-08-17 ENCOUNTER — Ambulatory Visit: Payer: BC Managed Care – PPO | Admitting: Psychology

## 2023-08-17 DIAGNOSIS — F332 Major depressive disorder, recurrent severe without psychotic features: Secondary | ICD-10-CM

## 2023-08-17 DIAGNOSIS — F339 Major depressive disorder, recurrent, unspecified: Secondary | ICD-10-CM

## 2023-08-17 DIAGNOSIS — F4322 Adjustment disorder with anxiety: Secondary | ICD-10-CM | POA: Diagnosis not present

## 2023-08-17 NOTE — Progress Notes (Signed)
 Date: 08/17/2023  Treatment Plan Diagnosis 296.33 (Major depressive affective disorder, recurrent episode, severe, without mention of psychotic behavior) [n/a]  F43.22 (Adjustment Disorder, With anxiety) [n/a]  Symptoms One partner engages in sexual behavior (e.g., penile-vaginal intercourse, oral sex, anal sex) that violates the explicit or implicit expectations of the relationship. (Status: maintained) -- No Description Entered  Medication Status compliance  Safety none  If Suicidal or Homicidal State Action Taken: unspecified  Current Risk: low Medications unspecified Objectives Related Problem: Partners verbally express empathy toward each other and demonstrate shared responsibility for reconstructing the relationship. Description: Clarify what level of trust exists for various aspects of the relationship. Target Date: 2024-05-27 Frequency: Daily Modality: individual Progress: 80%  Related Problem: Partners verbally express empathy toward each other and demonstrate shared responsibility for reconstructing the relationship. Description: List relationship issues that contribute to dissatisfaction and unhappiness and agree to address these in future conjoint sessions. Target Date: 2024-05-27 Frequency: Daily Modality: individual Progress: 75%  Related Problem: Partners verbally express empathy toward each other and demonstrate shared responsibility for reconstructing the relationship. Description: Identify and list behavioral changes for self and partner that would enhance the relationship. Target Date: 2024-05-27 Frequency: Daily Modality:  individual Progress: 85%  Related Problem: Partners verbally express empathy toward each other and demonstrate shared responsibility for reconstructing the relationship. Description: Describe pre-affair relationship functioning. Target Date: 2024-05-27 Frequency: Daily Modality: individual Progress: 90%  Related Problem: Partners verbally express empathy toward each other and demonstrate shared responsibility for reconstructing the relationship. Description: Ask the partners to generate factors that they believe contributed to the affair. Target Date: 2024-05-27 Frequency: Daily Modality: individual Progress: 90%   Problem: Partners verbally express empathy toward each other and demonstrate shared responsibility for reconstructing the relationship. Description: Hurt partner uses anxiety-management techniques to deal with intrusive thoughts about the affair. Target Date: 2024-05-27 Frequency: Daily Modality: individual Progress: 65%  Client Response full compliance  Service Location Location, [address] [place], [place] [address]  Service Code cpt [phone]  Self care activities  Provide education, information  Emotion regulation skills  Validate/empathize  Identify/label emotions  Facilitate problem solving  Comments  Patient agrees to a video (Caregility) session and understands the limits of this platform. Patient is at home and provider is in home office.   Dx.: Depression  Meds: Xanax. Tried antidep. meds, but she has significant side-effects. Goals: They are seeking counseling to resolve significant marital conflict secondary to infidelity. [name] has experienced symptoms of both anxiety and depression during this period  and is hoping to reduce these symptoms as the relationship issues improve. [name] is experiencing some depressive symptoms as well, along with considerable anger and frustration. Goal date 12-25   [name] and [name]: [name] says that he is tired of being  blamed for "all the problems". He acknowledges that there is no justification for how he talks to [place] or gets physical. He says that he cannot control his behavior once he gets triggered. [name] did not get her Ketamine treatment on Friday as scheduled. She overslept and had to reschedule to this Thursday. Son [name] going to court Monday, and [name] will be out of town. They are worried that their son will make poor decisions. We talked about the need the reinforce appropriate boundaries. Suggested that [name] not attend the proceedings. [name] and [name] have had a good (better) week with each other.                                                                                [name], Ph.D.  Time 5:10p-6:00p 50 minutes

## 2023-08-19 DIAGNOSIS — Z79899 Other long term (current) drug therapy: Secondary | ICD-10-CM | POA: Diagnosis not present

## 2023-08-19 DIAGNOSIS — Z5181 Encounter for therapeutic drug level monitoring: Secondary | ICD-10-CM | POA: Diagnosis not present

## 2023-08-20 ENCOUNTER — Other Ambulatory Visit (HOSPITAL_COMMUNITY): Payer: Self-pay

## 2023-08-25 ENCOUNTER — Encounter: Payer: Self-pay | Admitting: Family Medicine

## 2023-08-25 ENCOUNTER — Ambulatory Visit (INDEPENDENT_AMBULATORY_CARE_PROVIDER_SITE_OTHER): Payer: BC Managed Care – PPO | Admitting: Psychology

## 2023-08-25 DIAGNOSIS — F332 Major depressive disorder, recurrent severe without psychotic features: Secondary | ICD-10-CM | POA: Diagnosis not present

## 2023-08-25 DIAGNOSIS — F339 Major depressive disorder, recurrent, unspecified: Secondary | ICD-10-CM

## 2023-08-25 NOTE — Progress Notes (Signed)
 Date: 08/25/2023  Treatment Plan Diagnosis 296.33 (Major depressive affective disorder, recurrent episode, severe, without mention of psychotic behavior) [n/a]  F43.22 (Adjustment Disorder, With anxiety) [n/a]  Symptoms One partner engages in sexual behavior (e.g., penile-vaginal intercourse, oral sex, anal sex) that violates the explicit or implicit expectations of the relationship. (Status: maintained) -- No Description Entered  Medication Status compliance  Safety none  If Suicidal or Homicidal State Action Taken: unspecified  Current Risk: low Medications unspecified Objectives Related Problem: Partners verbally express empathy toward each other and demonstrate shared responsibility for reconstructing the relationship. Description: Clarify what level of trust exists for various aspects of the relationship. Target Date: 2024-05-27 Frequency: Daily Modality: individual Progress: 80%  Related Problem: Partners verbally express empathy toward each other and demonstrate shared responsibility for reconstructing the relationship. Description: List relationship issues that contribute to dissatisfaction and unhappiness and agree to address these in future conjoint sessions. Target Date: 2024-05-27 Frequency: Daily Modality: individual Progress: 75%  Related Problem: Partners verbally express empathy toward each other and demonstrate shared responsibility for reconstructing the relationship. Description: Identify and list behavioral changes for self and partner that would enhance the relationship. Target Date: 2024-05-27 Frequency:  Daily Modality: individual Progress: 85%  Related Problem: Partners verbally express empathy toward each other and demonstrate shared responsibility for reconstructing the relationship. Description: Describe pre-affair relationship functioning. Target Date: 2024-05-27 Frequency: Daily Modality: individual Progress: 90%  Related Problem: Partners verbally express empathy toward each other and demonstrate shared responsibility for reconstructing the relationship. Description: Ask the partners to generate factors that they believe contributed to the affair. Target Date: 2024-05-27 Frequency: Daily Modality: individual Progress: 90%   Problem: Partners verbally express empathy toward each other and demonstrate shared responsibility for reconstructing the relationship. Description: Hurt partner uses anxiety-management techniques to deal with intrusive thoughts about the affair. Target Date: 2024-05-27 Frequency: Daily Modality: individual Progress: 65%  Client Response full compliance  Service Location Location, [address] [place], [place] [address]  Service Code cpt [phone]  Self care activities  Provide education, information  Emotion regulation skills  Validate/empathize  Identify/label emotions  Facilitate problem solving  Comments  Patient agrees to a video (Caregility) session and understands the limits of this platform. Patient is at home and provider is in home office.   Dx.: Depression  Meds: Xanax. Tried antidep. meds, but she has significant side-effects. Goals: They are seeking counseling to resolve significant marital conflict  secondary to infidelity. [name] has experienced symptoms of both anxiety and depression during this period and is hoping to reduce these symptoms as the relationship issues improve. [name] is experiencing some depressive symptoms as well, along with considerable anger and frustration. Goal date 12-25   [name] and [name]: [name] thought that  the Ketamine would help her pain in her feet. She is distressed that it did not help and she is feeling hopeless to ever get her pain issues resolved. She will try it again, but is reluctant.  States their son did not end up in jail. The two of them have done well together in the past few days. They are communicating better and [name] feels more stable.                                                                                   [name], Ph.D.  Time 5:10p-6:00p 50 minutes

## 2023-08-26 ENCOUNTER — Other Ambulatory Visit (HOSPITAL_COMMUNITY): Payer: Self-pay

## 2023-08-26 ENCOUNTER — Other Ambulatory Visit: Payer: Self-pay

## 2023-08-27 ENCOUNTER — Other Ambulatory Visit

## 2023-08-27 ENCOUNTER — Other Ambulatory Visit (HOSPITAL_COMMUNITY): Payer: Self-pay

## 2023-08-27 DIAGNOSIS — F1190 Opioid use, unspecified, uncomplicated: Secondary | ICD-10-CM

## 2023-08-27 DIAGNOSIS — R197 Diarrhea, unspecified: Secondary | ICD-10-CM

## 2023-08-28 ENCOUNTER — Other Ambulatory Visit (HOSPITAL_COMMUNITY): Payer: Self-pay

## 2023-08-30 LAB — DRUG MONITOR, PANEL 1, W/CONF, URINE
Amphetamine: 4912 ng/mL — ABNORMAL HIGH (ref <250)
Amphetamines: POSITIVE ng/mL — AB (ref <500)
Barbiturates: NEGATIVE ng/mL (ref <300)
Benzodiazepines: NEGATIVE ng/mL (ref <100)
Cocaine Metabolite: NEGATIVE ng/mL (ref <150)
Codeine: NEGATIVE ng/mL (ref <50)
Creatinine: 274.7 mg/dL (ref >=20.0)
Hydrocodone: NEGATIVE ng/mL (ref <50)
Hydromorphone: >10000 ng/mL — ABNORMAL HIGH (ref <50)
Marijuana Metabolite: NEGATIVE ng/mL (ref <20)
Methadone Metabolite: NEGATIVE ng/mL (ref <100)
Methamphetamine: NEGATIVE ng/mL (ref <250)
Morphine: NEGATIVE ng/mL (ref <50)
Norhydrocodone: NEGATIVE ng/mL (ref <50)
Noroxycodone: >10000 ng/mL — ABNORMAL HIGH (ref <50)
Opiates: POSITIVE ng/mL — AB (ref <100)
Oxidant: NEGATIVE ug/mL (ref <200)
Oxycodone: >10000 ng/mL — ABNORMAL HIGH (ref <50)
Oxycodone: POSITIVE ng/mL — AB (ref <100)
Oxymorphone: >10000 ng/mL — ABNORMAL HIGH (ref <50)
Phencyclidine: NEGATIVE ng/mL (ref <25)
pH: 6.4 (ref 4.5–9.0)

## 2023-08-30 LAB — DM TEMPLATE

## 2023-08-31 ENCOUNTER — Ambulatory Visit (INDEPENDENT_AMBULATORY_CARE_PROVIDER_SITE_OTHER): Payer: BC Managed Care – PPO | Admitting: Psychology

## 2023-08-31 DIAGNOSIS — F4322 Adjustment disorder with anxiety: Secondary | ICD-10-CM

## 2023-08-31 DIAGNOSIS — F332 Major depressive disorder, recurrent severe without psychotic features: Secondary | ICD-10-CM | POA: Diagnosis not present

## 2023-08-31 DIAGNOSIS — F339 Major depressive disorder, recurrent, unspecified: Secondary | ICD-10-CM

## 2023-08-31 NOTE — Progress Notes (Signed)
 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 (Status: maintained) -- No Description Entered  Medication Status compliance  Safety none  If Suicidal or Homicidal State Action Taken: unspecified  Current Risk: low Medications unspecified Objectives Related Problem: Partners verbally express empathy toward each other and demonstrate shared responsibility for reconstructing the relationship. Description: Clarify what level of trust exists for various aspects of the relationship. Target Date: 2024-05-27 Frequency: Daily Modality: individual Progress: 80%  Related Problem: Partners verbally express empathy toward each other and demonstrate shared responsibility for reconstructing the relationship. Description: List relationship issues that contribute to dissatisfaction and unhappiness and agree to address these in future conjoint sessions. Target Date: 2024-05-27 Frequency: Daily Modality: individual Progress: 75%  Related Problem: Partners verbally express empathy toward each other and demonstrate shared responsibility for reconstructing the relationship. Description: Identify and list behavioral changes for self and partner that would enhance the relationship. Target  Date: 2024-05-27 Frequency: Daily Modality: individual Progress: 85%  Related Problem: Partners verbally express empathy toward each other and demonstrate shared responsibility for reconstructing the relationship. Description: Describe pre-affair relationship functioning. Target Date: 2024-05-27 Frequency: Daily Modality: individual Progress: 90%  Related Problem: Partners verbally express empathy toward each other and demonstrate shared responsibility for reconstructing the relationship. Description: Ask the partners to generate factors that they believe contributed to the affair. Target Date: 2024-05-27 Frequency: Daily Modality: individual Progress: 90%   Problem: Partners verbally express empathy toward each other and demonstrate shared responsibility for reconstructing the relationship. Description: Hurt partner uses anxiety-management techniques to deal with intrusive thoughts about the affair. Target Date: 2024-05-27 Frequency: Daily Modality: individual Progress: 65%  Client Response full compliance  Service Location Location, [address] [place], [place] [address]  Service Code cpt [phone]  Self care activities  Provide education, information  Emotion regulation skills  Validate/empathize  Identify/label emotions  Facilitate problem solving  Comments  Patient agrees to a video (Caregility) session and understands the limits of this platform. Patient is at home and provider is in home office.   Dx.: Depression  Meds: Xanax. Tried antidep. meds,  but she has significant side-effects. Goals: They are seeking counseling to resolve significant marital conflict secondary to infidelity. [name] has experienced symptoms of both anxiety and depression during this period and is hoping to reduce these symptoms as the relationship issues improve. [name] is experiencing some depressive symptoms as well, along with considerable anger and frustration. Goal date 12-25   [name]  and [name]: [name] was gone all week to [place] and came back from [place]. He is back in [place] now. Says he a [name] are doing well and they are going, along with [name]'s family, on a cruise (father in law, his girlfriend and their niece). He anticipates everyone will  get along. We discussed how a very minor interaction turned into another argument. They overly dissect each other's words and over interpret. On Sunday they didn't talk until she decided to go with him to [place]. Discussed how to interact in a more cooperative manner.                                                                                      [name], Ph.D.  Time 4:10p-5:00p 50 minutes

## 2023-09-03 ENCOUNTER — Other Ambulatory Visit (HOSPITAL_COMMUNITY): Payer: Self-pay

## 2023-09-06 ENCOUNTER — Ambulatory Visit: Admitting: Family Medicine

## 2023-09-06 ENCOUNTER — Other Ambulatory Visit (HOSPITAL_COMMUNITY): Payer: Self-pay

## 2023-09-06 ENCOUNTER — Encounter: Payer: Self-pay | Admitting: Family Medicine

## 2023-09-06 VITALS — BP 110/78 | HR 98 | Temp 98.7°F | Wt 237.0 lb

## 2023-09-06 DIAGNOSIS — T7840XD Allergy, unspecified, subsequent encounter: Secondary | ICD-10-CM | POA: Diagnosis not present

## 2023-09-06 DIAGNOSIS — M48061 Spinal stenosis, lumbar region without neurogenic claudication: Secondary | ICD-10-CM | POA: Diagnosis not present

## 2023-09-06 DIAGNOSIS — F1190 Opioid use, unspecified, uncomplicated: Secondary | ICD-10-CM

## 2023-09-06 MED ORDER — PANTOPRAZOLE SODIUM 40 MG PO TBEC
40.0000 mg | DELAYED_RELEASE_TABLET | Freq: Two times a day (BID) | ORAL | 3 refills | Status: AC
  Filled 2023-09-06 – 2023-09-07 (×2): qty 60, 30d supply, fill #0
  Filled 2023-10-24: qty 60, 30d supply, fill #1

## 2023-09-06 MED ORDER — OXYCODONE HCL 15 MG PO TABS
15.0000 mg | ORAL_TABLET | ORAL | 0 refills | Status: DC | PRN
  Filled 2023-09-06 – 2023-11-06 (×4): qty 180, 30d supply, fill #0

## 2023-09-06 MED ORDER — HYDROMORPHONE HCL 8 MG PO TABS
8.0000 mg | ORAL_TABLET | Freq: Every day | ORAL | 0 refills | Status: DC
  Filled 2023-09-06 – 2023-09-20 (×2): qty 30, 30d supply, fill #0

## 2023-09-06 MED ORDER — WEGOVY 2.4 MG/0.75ML ~~LOC~~ SOAJ: 2.4000 mg | SUBCUTANEOUS | 3 refills | Status: AC

## 2023-09-06 MED ORDER — AMPHETAMINE-DEXTROAMPHET ER 30 MG PO CP24
30.0000 mg | ORAL_CAPSULE | Freq: Every day | ORAL | 0 refills | Status: DC
  Filled 2023-09-06 – 2023-11-15 (×3): qty 30, 30d supply, fill #0

## 2023-09-06 MED ORDER — POTASSIUM CHLORIDE CRYS ER 20 MEQ PO TBCR
40.0000 meq | EXTENDED_RELEASE_TABLET | Freq: Every day | ORAL | 3 refills | Status: AC
  Filled 2023-09-06 – 2023-09-07 (×2): qty 60, 30d supply, fill #0
  Filled 2024-05-08: qty 60, 30d supply, fill #1

## 2023-09-06 MED ORDER — HYDROMORPHONE HCL 8 MG PO TABS
8.0000 mg | ORAL_TABLET | Freq: Every day | ORAL | 0 refills | Status: DC
  Filled 2023-09-06 – 2023-10-24 (×2): qty 30, 30d supply, fill #0

## 2023-09-06 MED ORDER — AMPHETAMINE-DEXTROAMPHET ER 30 MG PO CP24
30.0000 mg | ORAL_CAPSULE | Freq: Every day | ORAL | 0 refills | Status: DC
  Filled 2023-09-06 – 2023-10-11 (×2): qty 30, 30d supply, fill #0

## 2023-09-06 MED ORDER — OXYCODONE HCL 15 MG PO TABS
15.0000 mg | ORAL_TABLET | ORAL | 0 refills | Status: DC | PRN
  Filled 2023-09-06 – 2023-10-06 (×2): qty 180, 30d supply, fill #0

## 2023-09-06 MED ORDER — OXYCODONE HCL 15 MG PO TABS
15.0000 mg | ORAL_TABLET | ORAL | 0 refills | Status: DC | PRN
  Filled 2023-09-06 – 2023-09-07 (×2): qty 180, 30d supply, fill #0

## 2023-09-06 MED ORDER — AMPHETAMINE-DEXTROAMPHET ER 30 MG PO CP24
30.0000 mg | ORAL_CAPSULE | Freq: Every day | ORAL | 0 refills | Status: DC
  Filled 2023-09-06 – 2023-12-17 (×2): qty 30, 30d supply, fill #0

## 2023-09-06 MED ORDER — HYDROMORPHONE HCL 8 MG PO TABS
8.0000 mg | ORAL_TABLET | Freq: Every day | ORAL | 0 refills | Status: DC
  Filled 2023-09-06: qty 30, 30d supply, fill #0

## 2023-09-06 NOTE — Progress Notes (Signed)
   Subjective:    Patient ID: [name], female    DOB: 08-16-1975, 48 y.o.   MRN: [id number]  HPI Here for pain management. Her back pain is about the same.    Review of Systems  Constitutional: Negative.   Musculoskeletal:  Positive for back pain.       Objective:   Physical Exam Constitutional:      Appearance: Normal appearance.  Neurological:     Mental Status: She is alert.           Assessment & Plan:  Pain management. Indication for chronic opioid: low back pain Medication and dose: Oxycodone 15 mg and Hydromorphone 8 mg # pills per month: 180 and 30 Last UDS date: 08-27-23 Opioid Treatment Agreement signed (Y/N): 04-17-19 Opioid Treatment Agreement last reviewed with patient:  09-06-23 [facility] reviewed this encounter (include red flags): Yes Meds were refilled.  Gershon Crane, MD

## 2023-09-07 ENCOUNTER — Other Ambulatory Visit: Payer: Self-pay

## 2023-09-07 ENCOUNTER — Other Ambulatory Visit (HOSPITAL_COMMUNITY): Payer: Self-pay

## 2023-09-08 ENCOUNTER — Ambulatory Visit (INDEPENDENT_AMBULATORY_CARE_PROVIDER_SITE_OTHER): Payer: BC Managed Care – PPO | Admitting: Psychology

## 2023-09-08 ENCOUNTER — Other Ambulatory Visit (HOSPITAL_COMMUNITY): Payer: Self-pay

## 2023-09-08 DIAGNOSIS — F332 Major depressive disorder, recurrent severe without psychotic features: Secondary | ICD-10-CM

## 2023-09-08 DIAGNOSIS — F4322 Adjustment disorder with anxiety: Secondary | ICD-10-CM | POA: Diagnosis not present

## 2023-09-08 DIAGNOSIS — F339 Major depressive disorder, recurrent, unspecified: Secondary | ICD-10-CM

## 2023-09-08 NOTE — Progress Notes (Signed)
 Date: 09/08/2023  Treatment Plan Diagnosis 296.33 (Major depressive affective disorder, recurrent episode, severe, without mention of psychotic behavior) [n/a]  F43.22 (Adjustment Disorder, With anxiety) [n/a]  Symptoms One partner engages in sexual behavior (e.g., penile-vaginal intercourse, oral sex, anal sex) that violates the explicit or implicit expectations of the relationship. (Status: maintained) -- No Description Entered  Medication Status compliance  Safety none  If Suicidal or Homicidal State Action Taken: unspecified  Current Risk: low Medications unspecified Objectives Related Problem: Partners verbally express empathy toward each other and demonstrate shared responsibility for reconstructing the relationship. Description: Clarify what level of trust exists for various aspects of the relationship. Target Date: 2024-05-27 Frequency: Daily Modality: individual Progress: 80%  Related Problem: Partners verbally express empathy toward each other and demonstrate shared responsibility for reconstructing the relationship. Description: List relationship issues that contribute to dissatisfaction and unhappiness and agree to address these in future conjoint sessions. Target Date: 2024-05-27 Frequency: Daily Modality: individual Progress: 75%  Related Problem: Partners verbally express empathy toward each other and demonstrate shared responsibility for reconstructing the relationship. Description: Identify and list behavioral changes for self and partner that would  enhance the relationship. Target Date: 2024-05-27 Frequency: Daily Modality: individual Progress: 85%  Related Problem: Partners verbally express empathy toward each other and demonstrate shared responsibility for reconstructing the relationship. Description: Describe pre-affair relationship functioning. Target Date: 2024-05-27 Frequency: Daily Modality: individual Progress: 90%  Related Problem: Partners verbally express empathy toward each other and demonstrate shared responsibility for reconstructing the relationship. Description: Ask the partners to generate factors that they believe contributed to the affair. Target Date: 2024-05-27 Frequency: Daily Modality: individual Progress: 90%   Problem: Partners verbally express empathy toward each other and demonstrate shared responsibility for reconstructing the relationship. Description: Hurt partner uses anxiety-management techniques to deal with intrusive thoughts about the affair. Target Date: 2024-05-27 Frequency: Daily Modality: individual Progress: 65%  Client Response full compliance  Service Location Location, [address] [place], [place] [address]  Service Code cpt [phone]  Self care activities  Provide education, information  Emotion regulation skills  Validate/empathize  Identify/label emotions  Facilitate problem solving  Comments  Patient agrees to be seen in provider's office.  Dx.: Depression  Meds: Xanax. Tried antidep. meds, but she  has significant side-effects. Goals: They are seeking counseling to resolve significant marital conflict secondary to infidelity. [name] has experienced symptoms of both anxiety and depression during this period and is hoping to reduce these symptoms as the relationship issues improve. [name] is experiencing some depressive symptoms as well, along with considerable anger and frustration. Goal date 12-25   [name] and [name]: [name] and [name] came to office for the first time  because he is working in town this week. They talked about hoping to have a life that has regular hours. She doesn't believe [name] that he will actively seek another job. She thinks he will not look and he is stuck. [name] says there are options but Olevia doesn't believe him.                                                                                              [name], Ph.D.  Time 5:10p-6:00p 50 minutes

## 2023-09-14 ENCOUNTER — Ambulatory Visit: Payer: BC Managed Care – PPO | Admitting: Psychology

## 2023-09-20 ENCOUNTER — Other Ambulatory Visit (HOSPITAL_COMMUNITY): Payer: Self-pay

## 2023-09-20 ENCOUNTER — Other Ambulatory Visit: Payer: Self-pay

## 2023-09-22 ENCOUNTER — Ambulatory Visit (INDEPENDENT_AMBULATORY_CARE_PROVIDER_SITE_OTHER): Payer: BC Managed Care – PPO | Admitting: Psychology

## 2023-09-22 DIAGNOSIS — F332 Major depressive disorder, recurrent severe without psychotic features: Secondary | ICD-10-CM | POA: Diagnosis not present

## 2023-09-22 DIAGNOSIS — F4322 Adjustment disorder with anxiety: Secondary | ICD-10-CM | POA: Diagnosis not present

## 2023-09-22 DIAGNOSIS — F339 Major depressive disorder, recurrent, unspecified: Secondary | ICD-10-CM

## 2023-09-22 NOTE — Progress Notes (Signed)
 Date: 09/22/2023  Treatment Plan Diagnosis 296.33 (Major depressive affective disorder, recurrent episode, severe, without mention of psychotic behavior) [n/a]  F43.22 (Adjustment Disorder, With anxiety) [n/a]  Symptoms One partner engages in sexual behavior (e.g., penile-vaginal intercourse, oral sex, anal sex) that violates the explicit or implicit expectations of the relationship. (Status: maintained) -- No Description Entered  Medication Status compliance  Safety none  If Suicidal or Homicidal State Action Taken: unspecified  Current Risk: low Medications unspecified Objectives Related Problem: Partners verbally express empathy toward each other and demonstrate shared responsibility for reconstructing the relationship. Description: Clarify what level of trust exists for various aspects of the relationship. Target Date: 2024-05-27 Frequency: Daily Modality: individual Progress: 80%  Related Problem: Partners verbally express empathy toward each other and demonstrate shared responsibility for reconstructing the relationship. Description: List relationship issues that contribute to dissatisfaction and unhappiness and agree to address these in future conjoint sessions. Target Date: 2024-05-27 Frequency: Daily Modality: individual Progress: 75%  Related Problem: Partners verbally express empathy toward each other and demonstrate shared responsibility for reconstructing the relationship. Description: Identify and list behavioral changes for  self and partner that would enhance the relationship. Target Date: 2024-05-27 Frequency: Daily Modality: individual Progress: 85%  Related Problem: Partners verbally express empathy toward each other and demonstrate shared responsibility for reconstructing the relationship. Description: Describe pre-affair relationship functioning. Target Date: 2024-05-27 Frequency: Daily Modality: individual Progress: 90%  Related Problem: Partners verbally express empathy toward each other and demonstrate shared responsibility for reconstructing the relationship. Description: Ask the partners to generate factors that they believe contributed to the affair. Target Date: 2024-05-27 Frequency: Daily Modality: individual Progress: 90%   Problem: Partners verbally express empathy toward each other and demonstrate shared responsibility for reconstructing the relationship. Description: Hurt partner uses anxiety-management techniques to deal with intrusive thoughts about the affair. Target Date: 2024-05-27 Frequency: Daily Modality: individual Progress: 65%  Client Response full compliance  Service Location Location, [address] [place], [place] [address]  Service Code cpt [phone]  Self care activities  Provide education, information  Emotion regulation skills  Validate/empathize  Identify/label emotions  Facilitate problem solving  Comments  Patient agrees to a  video (Caregility) session and understands the limitations of this platform. Patient is at home and provider in his office.   Dx.: Depression  Meds: Xanax . Tried antidep. meds, but she has significant side-effects. Goals: They are seeking counseling to resolve significant marital conflict secondary to infidelity. [name] has experienced symptoms of both anxiety and depression during this period and is hoping to reduce these symptoms as the relationship issues improve. [name] is experiencing some depressive symptoms as well, along with  considerable anger and frustration. Goal date 12-25   [name] and [name]: [name] and [name] were on a family cruise last week. They said it was a terrible trip because of their daughter. They stayed up all night packing and then drove 9 hours. Nce on boat, her foot got swollen and had a large blister. She could not get off boat and had a hard time leaving the room. They did well with each other on the trip, as they "bonded" around their anger at the kids. She says [name] was supportive.                                                                                                  [name], Ph.D.  Time 5:10p-6:00p 50 minutes

## 2023-09-23 ENCOUNTER — Other Ambulatory Visit (HOSPITAL_COMMUNITY): Payer: Self-pay

## 2023-09-28 ENCOUNTER — Ambulatory Visit (INDEPENDENT_AMBULATORY_CARE_PROVIDER_SITE_OTHER): Payer: BC Managed Care – PPO | Admitting: Psychology

## 2023-09-28 DIAGNOSIS — F4322 Adjustment disorder with anxiety: Secondary | ICD-10-CM

## 2023-09-28 DIAGNOSIS — F332 Major depressive disorder, recurrent severe without psychotic features: Secondary | ICD-10-CM | POA: Diagnosis not present

## 2023-09-28 DIAGNOSIS — F339 Major depressive disorder, recurrent, unspecified: Secondary | ICD-10-CM

## 2023-09-28 NOTE — Progress Notes (Signed)
 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 (Status: maintained) -- No Description Entered  Medication Status compliance  Safety none  If Suicidal or Homicidal State Action Taken: unspecified  Current Risk: low Medications unspecified Objectives Related Problem: Partners verbally express empathy toward each other and demonstrate shared responsibility for reconstructing the relationship. Description: Clarify what level of trust exists for various aspects of the relationship. Target Date: 2024-05-27 Frequency: Daily Modality: individual Progress: 80%  Related Problem: Partners verbally express empathy toward each other and demonstrate shared responsibility for reconstructing the relationship. Description: List relationship issues that contribute to dissatisfaction and unhappiness and agree to address these in future conjoint sessions. Target Date: 2024-05-27 Frequency: Daily Modality: individual Progress: 75%  Related Problem: Partners verbally express empathy toward each other and demonstrate shared responsibility for reconstructing the relationship. Description: Identify and  list behavioral changes for self and partner that would enhance the relationship. Target Date: 2024-05-27 Frequency: Daily Modality: individual Progress: 85%  Related Problem: Partners verbally express empathy toward each other and demonstrate shared responsibility for reconstructing the relationship. Description: Describe pre-affair relationship functioning. Target Date: 2024-05-27 Frequency: Daily Modality: individual Progress: 90%  Related Problem: Partners verbally express empathy toward each other and demonstrate shared responsibility for reconstructing the relationship. Description: Ask the partners to generate factors that they believe contributed to the affair. Target Date: 2024-05-27 Frequency: Daily Modality: individual Progress: 90%   Problem: Partners verbally express empathy toward each other and demonstrate shared responsibility for reconstructing the relationship. Description: Hurt partner uses anxiety-management techniques to deal with intrusive thoughts about the affair. Target Date: 2024-05-27 Frequency: Daily Modality: individual Progress: 65%  Client Response full compliance  Service Location Location, [address] [place], [place] [address]  Service Code cpt [phone]  Self care activities  Provide education, information  Emotion regulation skills  Validate/empathize  Identify/label emotions  Facilitate problem solving  Comments  Patient agrees to a video Public affairs consultant) session and understands the limitations of this platform. Patient is at home and provider in his office.   Dx.: Depression  Meds: Xanax . Tried antidep. meds, but she has significant side-effects. Goals: They are seeking counseling to resolve significant marital conflict secondary to infidelity. [name] has experienced symptoms of both anxiety and depression during this period and is hoping to reduce these symptoms as the relationship issues improve. [name] is experiencing some depressive  symptoms as well, along with considerable anger and frustration. Goal date 12-25   [name] and [name]: [name] and [name]: He is now on nights and is very frustrated with his company and how he is treated.She feels [name] is so discontent, he needs to find another job. She feels he will stay unhappy in this job. She doesn't feel [name] lets go of work and needs to find leisure activities or passions.                                                                                                        [name], Ph.D.  Time 4:10p-5:00p 50 minutes

## 2023-09-29 ENCOUNTER — Other Ambulatory Visit (HOSPITAL_COMMUNITY): Payer: Self-pay

## 2023-09-29 ENCOUNTER — Other Ambulatory Visit: Payer: Self-pay

## 2023-09-29 ENCOUNTER — Other Ambulatory Visit: Payer: Self-pay | Admitting: Family Medicine

## 2023-09-30 ENCOUNTER — Other Ambulatory Visit (HOSPITAL_COMMUNITY): Payer: Self-pay

## 2023-09-30 ENCOUNTER — Other Ambulatory Visit: Payer: Self-pay

## 2023-09-30 MED ORDER — GABAPENTIN 300 MG PO CAPS
300.0000 mg | ORAL_CAPSULE | Freq: Three times a day (TID) | ORAL | 1 refills | Status: AC
  Filled 2023-09-30: qty 90, 30d supply, fill #0
  Filled 2024-01-18: qty 90, 30d supply, fill #1
  Filled 2024-02-15: qty 90, 30d supply, fill #2
  Filled 2024-05-27: qty 90, 30d supply, fill #3

## 2023-10-06 ENCOUNTER — Other Ambulatory Visit (HOSPITAL_COMMUNITY): Payer: Self-pay

## 2023-10-06 ENCOUNTER — Ambulatory Visit (INDEPENDENT_AMBULATORY_CARE_PROVIDER_SITE_OTHER): Admitting: Psychology

## 2023-10-06 DIAGNOSIS — F4322 Adjustment disorder with anxiety: Secondary | ICD-10-CM

## 2023-10-06 DIAGNOSIS — F332 Major depressive disorder, recurrent severe without psychotic features: Secondary | ICD-10-CM

## 2023-10-06 DIAGNOSIS — F339 Major depressive disorder, recurrent, unspecified: Secondary | ICD-10-CM

## 2023-10-06 NOTE — Progress Notes (Signed)
 Date: 10/06/2023  Treatment Plan Diagnosis 296.33 (Major depressive affective disorder, recurrent episode, severe, without mention of psychotic behavior) [n/a]  F43.22 (Adjustment Disorder, With anxiety) [n/a]  Symptoms One partner engages in sexual behavior (e.g., penile-vaginal intercourse, oral sex, anal sex) that violates the explicit or implicit expectations of the relationship. (Status: maintained) -- No Description Entered  Medication Status compliance  Safety none  If Suicidal or Homicidal State Action Taken: unspecified  Current Risk: low Medications unspecified Objectives Related Problem: Partners verbally express empathy toward each other and demonstrate shared responsibility for reconstructing the relationship. Description: Clarify what level of trust exists for various aspects of the relationship. Target Date: 2024-05-27 Frequency: Daily Modality: individual Progress: 80%  Related Problem: Partners verbally express empathy toward each other and demonstrate shared responsibility for reconstructing the relationship. Description: List relationship issues that contribute to dissatisfaction and unhappiness and agree to address these in future conjoint sessions. Target Date: 2024-05-27 Frequency: Daily Modality: individual Progress: 75%  Related Problem: Partners verbally express empathy toward each other and demonstrate shared responsibility for reconstructing the  relationship. Description: Identify and list behavioral changes for self and partner that would enhance the relationship. Target Date: 2024-05-27 Frequency: Daily Modality: individual Progress: 85%  Related Problem: Partners verbally express empathy toward each other and demonstrate shared responsibility for reconstructing the relationship. Description: Describe pre-affair relationship functioning. Target Date: 2024-05-27 Frequency: Daily Modality: individual Progress: 90%  Related Problem: Partners verbally express empathy toward each other and demonstrate shared responsibility for reconstructing the relationship. Description: Ask the partners to generate factors that they believe contributed to the affair. Target Date: 2024-05-27 Frequency: Daily Modality: individual Progress: 90%   Problem: Partners verbally express empathy toward each other and demonstrate shared responsibility for reconstructing the relationship. Description: Hurt partner uses anxiety-management techniques to deal with intrusive thoughts about the affair. Target Date: 2024-05-27 Frequency: Daily Modality: individual Progress: 65%  Client Response full compliance  Service Location Location, [address] [place], [place] [address]  Service Code  cpt [id number]  Self care activities  Provide education, information  Emotion regulation skills  Validate/empathize  Identify/label emotions  Facilitate problem solving  Comments  Patient agrees to a video (Caregility) session and understands the limitations of this platform. Patient is at home and provider in his office.   Dx.: Depression  Meds: Xanax . Tried antidep. meds, but she has significant side-effects. Goals: They are seeking counseling to resolve significant marital conflict secondary to infidelity. [name] has experienced symptoms of both anxiety and depression during this period and is hoping to reduce these symptoms as the relationship issues improve.  [name] is experiencing some depressive symptoms as well, along with considerable anger and frustration. Goal date 12-25   [name] and [name]: They got on the the session in the middle of argument. 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 She thought he might be taping himself in the shower, as he had done in the past with his girlfriend. We reviewed ways they could have avoided the anger and rage they are now expressing.                                                                                                         [name], Ph.D.  Time 5:10p-6:00p 50 minutes

## 2023-10-11 ENCOUNTER — Other Ambulatory Visit (HOSPITAL_COMMUNITY): Payer: Self-pay

## 2023-10-11 ENCOUNTER — Other Ambulatory Visit: Payer: Self-pay

## 2023-10-12 ENCOUNTER — Ambulatory Visit: Admitting: Psychology

## 2023-10-12 DIAGNOSIS — F332 Major depressive disorder, recurrent severe without psychotic features: Secondary | ICD-10-CM

## 2023-10-12 DIAGNOSIS — F339 Major depressive disorder, recurrent, unspecified: Secondary | ICD-10-CM

## 2023-10-12 DIAGNOSIS — F4322 Adjustment disorder with anxiety: Secondary | ICD-10-CM | POA: Diagnosis not present

## 2023-10-12 NOTE — Progress Notes (Signed)
 Date: 10/12/2023  Treatment Plan Diagnosis 296.33 (Major depressive affective disorder, recurrent episode, severe, without mention of psychotic behavior) [n/a]  F43.22 (Adjustment Disorder, With anxiety) [n/a]  Symptoms One partner engages in sexual behavior (e.g., penile-vaginal intercourse, oral sex, anal sex) that violates the explicit or implicit expectations of the relationship. (Status: maintained) -- No Description Entered  Medication Status compliance  Safety none  If Suicidal or Homicidal State Action Taken: unspecified  Current Risk: low Medications unspecified Objectives Related Problem: Partners verbally express empathy toward each other and demonstrate shared responsibility for reconstructing the relationship. Description: Clarify what level of trust exists for various aspects of the relationship. Target Date: 2024-05-27 Frequency: Daily Modality: individual Progress: 80%  Related Problem: Partners verbally express empathy toward each other and demonstrate shared responsibility for reconstructing the relationship. Description: List relationship issues that contribute to dissatisfaction and unhappiness and agree to address these in future conjoint sessions. Target Date: 2024-05-27 Frequency: Daily Modality: individual Progress: 75%  Related Problem: Partners verbally express empathy toward each other and demonstrate shared responsibility for  reconstructing the relationship. Description: Identify and list behavioral changes for self and partner that would enhance the relationship. Target Date: 2024-05-27 Frequency: Daily Modality: individual Progress: 85%  Related Problem: Partners verbally express empathy toward each other and demonstrate shared responsibility for reconstructing the relationship. Description: Describe pre-affair relationship functioning. Target Date: 2024-05-27 Frequency: Daily Modality: individual Progress: 90%  Related Problem: Partners verbally express empathy toward each other and demonstrate shared responsibility for reconstructing the relationship. Description: Ask the partners to generate factors that they believe contributed to the affair. Target Date: 2024-05-27 Frequency: Daily Modality: individual Progress: 90%   Problem: Partners verbally express empathy toward each other and demonstrate shared responsibility for reconstructing the relationship. Description: Hurt partner uses anxiety-management techniques to deal with intrusive thoughts about the affair. Target Date: 2024-05-27 Frequency: Daily Modality: individual Progress: 65%  Client Response full compliance  Service Location Location, [address] [place], [place] [address]  Service Code cpt [phone]  Self care activities  Provide education, information  Emotion regulation skills  Validate/empathize  Identify/label emotions  Facilitate problem solving  Comments  Patient agrees to a video (Caregility) session and understands the limitations of this platform. Patient is at home and provider in his office.   Dx.: Depression  Meds: Xanax . Tried antidep. meds, but she has significant side-effects. Goals: They are seeking counseling to resolve significant marital conflict secondary to infidelity. [name] has experienced symptoms of both anxiety and depression during this period and is hoping to reduce these symptoms as the  relationship issues improve. [name] is experiencing some depressive symptoms as well, along with considerable anger and frustration. Goal date 12-25   [name] and [name]: [name] is asking how will she ever regain trust in her relationship. We talked about never being able to have proof. She feels that once she feels there is true remorse, then she will start to trust. We talked about what is reasonable to do when [name]'s insecurities get triggered. Both agree that they can handle better moving forward.  [name] also talked about her disappointment with her daughter with regard to how she handled Mother's Day. Told her that she and daughter need to address their relationship.                                                                                                              [name], Ph.D.  Time 5:10p-6:00p 50 minutes

## 2023-10-20 ENCOUNTER — Ambulatory Visit (INDEPENDENT_AMBULATORY_CARE_PROVIDER_SITE_OTHER): Admitting: Psychology

## 2023-10-20 DIAGNOSIS — F332 Major depressive disorder, recurrent severe without psychotic features: Secondary | ICD-10-CM

## 2023-10-20 DIAGNOSIS — F4322 Adjustment disorder with anxiety: Secondary | ICD-10-CM | POA: Diagnosis not present

## 2023-10-20 DIAGNOSIS — F339 Major depressive disorder, recurrent, unspecified: Secondary | ICD-10-CM

## 2023-10-20 NOTE — Progress Notes (Signed)
 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 (Status: maintained) -- No Description Entered  Medication Status compliance  Safety none  If Suicidal or Homicidal State Action Taken: unspecified  Current Risk: low Medications unspecified Objectives Related Problem: Partners verbally express empathy toward each other and demonstrate shared responsibility for reconstructing the relationship. Description: Clarify what level of trust exists for various aspects of the relationship. Target Date: 2024-05-27 Frequency: Daily Modality: individual Progress: 80%  Related Problem: Partners verbally express empathy toward each other and demonstrate shared responsibility for reconstructing the relationship. Description: List relationship issues that contribute to dissatisfaction and unhappiness and agree to address these in future conjoint sessions. Target Date: 2024-05-27 Frequency: Daily Modality: individual Progress: 75%  Related Problem: Partners verbally express empathy toward each other and  demonstrate shared responsibility for reconstructing the relationship. Description: Identify and list behavioral changes for self and partner that would enhance the relationship. Target Date: 2024-05-27 Frequency: Daily Modality: individual Progress: 85%  Related Problem: Partners verbally express empathy toward each other and demonstrate shared responsibility for reconstructing the relationship. Description: Describe pre-affair relationship functioning. Target Date: 2024-05-27 Frequency: Daily Modality: individual Progress: 90%  Related Problem: Partners verbally express empathy toward each other and demonstrate shared responsibility for reconstructing the relationship. Description: Ask the partners to generate factors that they believe contributed to the affair. Target Date: 2024-05-27 Frequency: Daily Modality: individual Progress: 90%   Problem: Partners verbally express empathy toward each other and demonstrate shared responsibility for reconstructing the relationship. Description: Hurt partner uses anxiety-management techniques to deal with intrusive thoughts about the  affair. Target Date: 2024-05-27 Frequency: Daily Modality: individual Progress: 65%  Client Response full compliance  Service Location Location, [address] [place], [place] [address]  Service Code cpt [phone]  Self care activities  Provide education, information  Emotion regulation skills  Validate/empathize  Identify/label emotions  Facilitate problem solving  Comments  Patient agrees to a video (Caregility) session and understands the limitations of this platform. Patient is at home and provider in his office.   Dx.: Depression  Meds: Xanax . Tried antidep. meds, but she has significant side-effects. Goals: They are seeking counseling to resolve significant marital conflict secondary to infidelity. [name] has experienced symptoms of both anxiety and depression during this period and is hoping to  reduce these symptoms as the relationship issues improve. [name] is experiencing some depressive symptoms as well, along with considerable anger and frustration. Goal date 12-25   [name] and [name]: Taiya says her daughter reached out and seems to be making an effort to forward the relationship. Their son and his "baby mama" have now split up and she is going to be delivering the baby toward end of month. They are confident they are not getting "the whole picture" of what happened. We talked about setting appropriate boundaries with their son and not be enabling.                                                                                                                  [name], Ph.D.  Time 5:10p-6:00p 50 minutes

## 2023-10-24 ENCOUNTER — Other Ambulatory Visit: Payer: Self-pay

## 2023-10-24 ENCOUNTER — Other Ambulatory Visit: Payer: Self-pay | Admitting: Family Medicine

## 2023-10-26 ENCOUNTER — Other Ambulatory Visit (HOSPITAL_COMMUNITY): Payer: Self-pay

## 2023-10-26 ENCOUNTER — Ambulatory Visit: Admitting: Psychology

## 2023-10-26 DIAGNOSIS — F332 Major depressive disorder, recurrent severe without psychotic features: Secondary | ICD-10-CM | POA: Diagnosis not present

## 2023-10-26 DIAGNOSIS — F339 Major depressive disorder, recurrent, unspecified: Secondary | ICD-10-CM

## 2023-10-26 DIAGNOSIS — F4322 Adjustment disorder with anxiety: Secondary | ICD-10-CM | POA: Diagnosis not present

## 2023-10-26 MED ORDER — HYDROCHLOROTHIAZIDE 25 MG PO TABS
25.0000 mg | ORAL_TABLET | Freq: Every day | ORAL | 0 refills | Status: DC
Start: 2023-10-26 — End: 2024-02-15
  Filled 2023-10-26: qty 30, 30d supply, fill #0
  Filled 2023-12-16: qty 30, 30d supply, fill #1
  Filled 2024-01-18: qty 30, 30d supply, fill #2

## 2023-10-26 MED ORDER — ESOMEPRAZOLE MAGNESIUM 40 MG PO CPDR
40.0000 mg | DELAYED_RELEASE_CAPSULE | Freq: Every day | ORAL | 0 refills | Status: DC
  Filled 2023-10-26: qty 30, 30d supply, fill #0
  Filled 2023-12-16: qty 30, 30d supply, fill #1
  Filled 2024-01-18: qty 30, 30d supply, fill #2

## 2023-10-26 NOTE — Progress Notes (Signed)
 Date: 10/26/2023  Treatment Plan Diagnosis 296.33 (Major depressive affective disorder, recurrent episode, severe, without mention of psychotic behavior) [n/a]  F43.22 (Adjustment Disorder, With anxiety) [n/a]  Symptoms One partner engages in sexual behavior (e.g., penile-vaginal intercourse, oral sex, anal sex) that violates the explicit or implicit expectations of the relationship. (Status: maintained) -- No Description Entered  Medication Status compliance  Safety none  If Suicidal or Homicidal State Action Taken: unspecified  Current Risk: low Medications unspecified Objectives Related Problem: Partners verbally express empathy toward each other and demonstrate shared responsibility for reconstructing the relationship. Description: Clarify what level of trust exists for various aspects of the relationship. Target Date: 2024-05-27 Frequency: Daily Modality: individual Progress: 80%  Related Problem: Partners verbally express empathy toward each other and demonstrate shared responsibility for reconstructing the relationship. Description: List relationship issues that contribute to dissatisfaction and unhappiness and agree to address these in future conjoint sessions. Target Date: 2024-05-27 Frequency: Daily Modality: individual Progress: 75%  Related Problem: Partners verbally express empathy  toward each other and demonstrate shared responsibility for reconstructing the relationship. Description: Identify and list behavioral changes for self and partner that would enhance the relationship. Target Date: 2024-05-27 Frequency: Daily Modality: individual Progress: 85%  Related Problem: Partners verbally express empathy toward each other and demonstrate shared responsibility for reconstructing the relationship. Description: Describe pre-affair relationship functioning. Target Date: 2024-05-27 Frequency: Daily Modality: individual Progress: 90%  Related Problem: Partners verbally express empathy toward each other and demonstrate shared responsibility for reconstructing the relationship. Description: Ask the partners to generate factors that they believe contributed to the affair. Target Date: 2024-05-27 Frequency: Daily Modality: individual Progress: 90%   Problem: Partners verbally express empathy toward each other and demonstrate shared responsibility for reconstructing  the relationship. Description: Hurt partner uses anxiety-management techniques to deal with intrusive thoughts about the affair. Target Date: 2024-05-27 Frequency: Daily Modality: individual Progress: 65%  Client Response full compliance  Service Location Location, [address] [place], [place] [address]  Service Code cpt [phone]  Self care activities  Provide education, information  Emotion regulation skills  Validate/empathize  Identify/label emotions  Facilitate problem solving  Comments  Patient agrees to a video (Caregility) session and understands the limitations of this platform. Patient is at home and provider in his office.   Dx.: Depression  Meds: Xanax . Tried antidep. meds, but she has significant side-effects. Goals: They are seeking counseling to resolve significant marital conflict secondary to infidelity. [name] has experienced symptoms of both anxiety and depression during this  period and is hoping to reduce these symptoms as the relationship issues improve. [name] is experiencing some depressive symptoms as well, along with considerable anger and frustration. Goal date 12-25   [name] and [name]: They are on their way back to [place]. They had a bad weekend and [name] fears that talking about it brings up bad feelings and it will ruin the day. They argued about each interrupting the other when trying to communicate. Each accuses the other of hijacking the conversation. He does say that he feels that [name] talks to him in a way that implies she feels superior to him.                                                                                                                     [name], Ph.D.  Time 4:10p-5:00p 50 minutes

## 2023-10-28 ENCOUNTER — Other Ambulatory Visit (HOSPITAL_COMMUNITY): Payer: Self-pay

## 2023-10-28 ENCOUNTER — Telehealth: Payer: Self-pay

## 2023-10-28 NOTE — Telephone Encounter (Signed)
 Pharmacy Patient Advocate Encounter   Received notification from [employer] that prior authorization for Wegovy  2.4MG /0.75ML auto-injectors is required/requested.   Insurance verification completed.   The patient is insured through [employer] .   Per test claim: PA required; PA submitted to above mentioned insurance via CoverMyMeds Key/confirmation #/EOC (Key: [id number])    Status is pending

## 2023-11-03 ENCOUNTER — Ambulatory Visit (INDEPENDENT_AMBULATORY_CARE_PROVIDER_SITE_OTHER): Admitting: Psychology

## 2023-11-03 ENCOUNTER — Other Ambulatory Visit (HOSPITAL_COMMUNITY): Payer: Self-pay

## 2023-11-03 DIAGNOSIS — F339 Major depressive disorder, recurrent, unspecified: Secondary | ICD-10-CM

## 2023-11-03 DIAGNOSIS — F4322 Adjustment disorder with anxiety: Secondary | ICD-10-CM

## 2023-11-03 DIAGNOSIS — F332 Major depressive disorder, recurrent severe without psychotic features: Secondary | ICD-10-CM | POA: Diagnosis not present

## 2023-11-03 NOTE — Progress Notes (Signed)
 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 (Status: maintained) -- No Description Entered  Medication Status compliance  Safety none  If Suicidal or Homicidal State Action Taken: unspecified  Current Risk: low Medications unspecified Objectives Related Problem: Partners verbally express empathy toward each other and demonstrate shared responsibility for reconstructing the relationship. Description: Clarify what level of trust exists for various aspects of the relationship. Target Date: 2024-05-27 Frequency: Daily Modality: individual Progress: 80%  Related Problem: Partners verbally express empathy toward each other and demonstrate shared responsibility for reconstructing the relationship. Description: List relationship issues that contribute to dissatisfaction and unhappiness and agree to address these in future conjoint sessions. Target Date: 2024-05-27 Frequency: Daily Modality: individual Progress: 75%  Related Problem: Partners verbally express empathy toward each other and demonstrate shared responsibility for reconstructing the relationship. Description: Identify and list behavioral changes for self and partner that would enhance the relationship. Target Date: 2024-05-27 Frequency: Daily Modality: individual Progress: 85%  Related Problem: Partners verbally express empathy toward each other and demonstrate shared responsibility for reconstructing the relationship. Description: Describe pre-affair relationship functioning. Target Date: 2024-05-27 Frequency: Daily Modality: individual Progress: 90%  Related Problem: Partners  verbally express empathy toward each other and demonstrate shared responsibility for reconstructing the relationship. Description: Ask the partners to generate factors that they believe contributed to the affair. Target Date: 2024-05-27 Frequency: Daily Modality: individual Progress: 90%   Problem: Partners verbally express empathy toward each other and demonstrate shared responsibility for reconstructing the relationship. Description: Hurt partner uses anxiety-management techniques to deal with intrusive thoughts about the affair. Target Date: 2024-05-27 Frequency: Daily Modality: individual Progress: 65%  Client Response full compliance  Service Location Location, [address] [place], [place] [address]  Service Code cpt [phone]  Self care activities  Provide education, information  Emotion regulation skills  Validate/empathize  Identify/label emotions  Facilitate problem solving  Comments  Patient agrees to a video (Caregility) session and understands the limitations of this platform. Patient is at home and provider in his office.   Dx.: Depression  Meds: Xanax . Tried antidep. meds, but she has significant side-effects. Goals: They are seeking counseling to resolve significant marital conflict secondary to infidelity. [name] has experienced symptoms of both anxiety and depression during this period and is hoping to reduce these symptoms as the relationship issues improve. [name] is experiencing some depressive symptoms as well, along with considerable anger and frustration. Goal date 12-25   [name] and [name]: They continue to have problems with their son and his ongoing bad decisions. We talked about the level of responsibility they feel for their grandchildren and to "rescue" son. They are mostly on the same page , but are more prone to arguments when agitated by son's behavior.  [name], Ph.D.  Time 5:10p-6:00p 50 minutes

## 2023-11-04 ENCOUNTER — Other Ambulatory Visit (HOSPITAL_COMMUNITY): Payer: Self-pay

## 2023-11-04 NOTE — Telephone Encounter (Signed)
 Pharmacy Patient Advocate Encounter  Received notification from [employer] that Prior Authorization for Wegovy  2.4MG /0.75ML auto-injectorshas been DENIED.  Full denial letter will be uploaded to the media tab. See denial reason below.  Pt has not lost atleast 5% of her baseline weight when starting therapy

## 2023-11-05 ENCOUNTER — Other Ambulatory Visit: Payer: Self-pay

## 2023-11-05 NOTE — Telephone Encounter (Signed)
 I suppose she could try to pay herself

## 2023-11-08 ENCOUNTER — Other Ambulatory Visit: Payer: Self-pay

## 2023-11-08 ENCOUNTER — Other Ambulatory Visit (HOSPITAL_COMMUNITY): Payer: Self-pay

## 2023-11-09 ENCOUNTER — Ambulatory Visit: Admitting: Psychology

## 2023-11-09 DIAGNOSIS — F4322 Adjustment disorder with anxiety: Secondary | ICD-10-CM

## 2023-11-09 DIAGNOSIS — F332 Major depressive disorder, recurrent severe without psychotic features: Secondary | ICD-10-CM | POA: Diagnosis not present

## 2023-11-09 DIAGNOSIS — F339 Major depressive disorder, recurrent, unspecified: Secondary | ICD-10-CM

## 2023-11-09 NOTE — Progress Notes (Signed)
 Date: 11/09/2023  Treatment Plan Diagnosis 296.33 (Major depressive affective disorder, recurrent episode, severe, without mention of psychotic behavior) [n/a]  F43.22 (Adjustment Disorder, With anxiety) [n/a]  Symptoms One partner engages in sexual behavior (e.g., penile-vaginal intercourse, oral sex, anal sex) that violates the explicit or implicit expectations of the relationship. (Status: maintained) -- No Description Entered  Medication Status compliance  Safety none  If Suicidal or Homicidal State Action Taken: unspecified  Current Risk: low Medications unspecified Objectives Related Problem: Partners verbally express empathy toward each other and demonstrate shared responsibility for reconstructing the relationship. Description: Clarify what level of trust exists for various aspects of the relationship. Target Date: 2024-05-27 Frequency: Daily Modality: individual Progress: 80%  Related Problem: Partners verbally express empathy toward each other and demonstrate shared responsibility for reconstructing the relationship. Description: List relationship issues that contribute to dissatisfaction and unhappiness and agree to address these in future conjoint sessions. Target Date: 2024-05-27 Frequency: Daily Modality: individual Progress: 75%  Related Problem: Partners verbally express empathy toward each other and demonstrate shared responsibility for reconstructing the relationship. Description: Identify and list behavioral changes for self and partner that would enhance the relationship. Target Date: 2024-05-27 Frequency: Daily Modality: individual Progress: 85%  Related Problem: Partners verbally express empathy toward each other and demonstrate shared responsibility for reconstructing the relationship. Description: Describe pre-affair relationship functioning. Target Date: 2024-05-27 Frequency: Daily Modality: individual Progress:  90%  Related Problem: Partners verbally express empathy toward each other and demonstrate shared responsibility for reconstructing the relationship. Description: Ask the partners to generate factors that they believe contributed to the affair. Target Date: 2024-05-27 Frequency: Daily Modality: individual Progress: 90%   Problem: Partners verbally express empathy toward each other and demonstrate shared responsibility for reconstructing the relationship. Description: Hurt partner uses anxiety-management techniques to deal with intrusive thoughts about the affair. Target Date: 2024-05-27 Frequency: Daily Modality: individual Progress: 65%  Client Response full compliance  Service Location Location, [address] [place], [place] [address]  Service Code cpt [phone]  Self care activities  Provide education, information  Emotion regulation skills  Validate/empathize  Identify/label emotions  Facilitate problem solving  Comments  Patient agrees to a video (Caregility) session and understands the limitations of this platform. Patient is at home and provider in his office.   Dx.: Depression  Meds: Xanax . Tried antidep. meds, but she has significant side-effects. Goals: They are seeking counseling to resolve significant marital conflict secondary to infidelity. Cena has experienced symptoms of both anxiety and depression during this period and is hoping to reduce these symptoms as the relationship issues improve. Marolyn is experiencing some depressive symptoms as well, along with considerable anger and frustration. Goal date 12-25   [name] and [name]: They talked about the ongoing difficulties with their son, who makes not substantial effort to see his kids. They tend to enable and rescue, but are trying to avoid repeating negative/destructive patterns. This is one of the few areas that they agree. Both recognize the dysfunction in their relationship with their kids and how they are  manipulated. They do struggle, however, in their efforts to resist enabling. With recent problems with their son, their relationship conflict with each other has been minimal.  CONI ALM KERNS, Ph.D.  Time 4:10p-5:00p 50 minutes

## 2023-11-16 ENCOUNTER — Other Ambulatory Visit: Payer: Self-pay

## 2023-11-17 ENCOUNTER — Ambulatory Visit: Admitting: Psychology

## 2023-11-17 DIAGNOSIS — F332 Major depressive disorder, recurrent severe without psychotic features: Secondary | ICD-10-CM | POA: Diagnosis not present

## 2023-11-17 DIAGNOSIS — F4322 Adjustment disorder with anxiety: Secondary | ICD-10-CM

## 2023-11-17 DIAGNOSIS — F339 Major depressive disorder, recurrent, unspecified: Secondary | ICD-10-CM

## 2023-11-17 NOTE — Progress Notes (Signed)
 Date: 11/17/2023  Treatment Plan Diagnosis 296.33 (Major depressive affective disorder, recurrent episode, severe, without mention of psychotic behavior) [n/a]  F43.22 (Adjustment Disorder, With anxiety) [n/a]  Symptoms One partner engages in sexual behavior (e.g., penile-vaginal intercourse, oral sex, anal sex) that violates the explicit or implicit expectations of the relationship. (Status: maintained) -- No Description Entered  Medication Status compliance  Safety none  If Suicidal or Homicidal State Action Taken: unspecified  Current Risk: low Medications unspecified Objectives Related Problem: Partners verbally express empathy toward each other and demonstrate shared responsibility for reconstructing the relationship. Description: Clarify what level of trust exists for various aspects of the relationship. Target Date: 2024-05-27 Frequency: Daily Modality: individual Progress: 80%  Related Problem: Partners verbally express empathy toward each other and demonstrate shared responsibility for reconstructing the relationship. Description: List relationship issues that contribute to dissatisfaction and unhappiness and agree to address these in future conjoint sessions. Target Date: 2024-05-27 Frequency: Daily Modality: individual Progress: 75%  Related Problem: Partners verbally express empathy toward each other and demonstrate shared responsibility for reconstructing the relationship. Description: Identify and list behavioral changes for self and partner that would enhance the relationship. Target Date: 2024-05-27 Frequency: Daily Modality: individual Progress: 85%  Related Problem: Partners verbally express empathy toward each other and demonstrate shared responsibility for reconstructing the relationship. Description: Describe pre-affair relationship functioning. Target Date: 2024-05-27 Frequency: Daily Modality:  individual Progress: 90%  Related Problem: Partners verbally express empathy toward each other and demonstrate shared responsibility for reconstructing the relationship. Description: Ask the partners to generate factors that they believe contributed to the affair. Target Date: 2024-05-27 Frequency: Daily Modality: individual Progress: 90%   Problem: Partners verbally express empathy toward each other and demonstrate shared responsibility for reconstructing the relationship. Description: Hurt partner uses anxiety-management techniques to deal with intrusive thoughts about the affair. Target Date: 2024-05-27 Frequency: Daily Modality: individual Progress: 65%  Client Response full compliance  Service Location Location, [address] [place], [place] [address]  Service Code cpt [id number]  Self care activities  Provide education, information  Emotion regulation skills  Validate/empathize  Identify/label emotions  Facilitate problem solving  Comments  Patient agrees to a video (Caregility) session and understands the limitations of this platform. Patient is at home and provider in his office.   Dx.: Depression  Meds: Xanax . Tried antidep. meds, but she has significant side-effects. Goals: They are seeking counseling to resolve significant marital conflict secondary to infidelity. [name] has experienced symptoms of both anxiety and depression during this period and is hoping to reduce these symptoms as the relationship issues improve. [name] is experiencing some depressive symptoms as well, along with considerable anger and frustration. Goal date 12-25   [name] and [name]: [name] says that he and [name] are getting along. He is working extra hours and is very tired. He went bowling with his 2 kids on Father's Day. Their son got 2 of his kids at the bowling alley for a little while. Court again next week and concerns that [name] will sign over rights. Discussed for the two of them to talk and  make sure they are on the same page with regard to their level of involvement.  [name], Ph.D.  Time 4:10p-5:00p 50 minutes

## 2023-11-23 ENCOUNTER — Ambulatory Visit (INDEPENDENT_AMBULATORY_CARE_PROVIDER_SITE_OTHER): Admitting: Psychology

## 2023-11-23 DIAGNOSIS — F332 Major depressive disorder, recurrent severe without psychotic features: Secondary | ICD-10-CM | POA: Diagnosis not present

## 2023-11-23 DIAGNOSIS — F339 Major depressive disorder, recurrent, unspecified: Secondary | ICD-10-CM

## 2023-11-23 DIAGNOSIS — F4322 Adjustment disorder with anxiety: Secondary | ICD-10-CM

## 2023-11-23 NOTE — Progress Notes (Signed)
 Date: 11/23/2023  Treatment Plan Diagnosis 296.33 (Major depressive affective disorder, recurrent episode, severe, without mention of psychotic behavior) [n/a]  F43.22 (Adjustment Disorder, With anxiety) [n/a]  Symptoms One partner engages in sexual behavior (e.g., penile-vaginal intercourse, oral sex, anal sex) that violates the explicit or implicit expectations of the relationship. (Status: maintained) -- No Description Entered  Medication Status compliance  Safety none  If Suicidal or Homicidal State Action Taken: unspecified  Current Risk: low Medications unspecified Objectives Related Problem: Partners verbally express empathy toward each other and demonstrate shared responsibility for reconstructing the relationship. Description: Clarify what level of trust exists for various aspects of the relationship. Target Date: 2024-05-27 Frequency: Daily Modality: individual Progress: 80%  Related Problem: Partners verbally express empathy toward each other and demonstrate shared responsibility for reconstructing the relationship. Description: List relationship issues that contribute to dissatisfaction and unhappiness and agree to address these in future conjoint sessions. Target Date: 2024-05-27 Frequency: Daily Modality: individual Progress: 75%  Related Problem: Partners verbally express empathy toward each other and demonstrate shared responsibility for reconstructing the relationship. Description: Identify and list behavioral changes for self and partner that would enhance the relationship. Target Date: 2024-05-27 Frequency: Daily Modality: individual Progress: 85%  Related Problem: Partners verbally express empathy toward each other and demonstrate shared responsibility for reconstructing the relationship. Description: Describe pre-affair relationship functioning. Target Date: 2024-05-27 Frequency:  Daily Modality: individual Progress: 90%  Related Problem: Partners verbally express empathy toward each other and demonstrate shared responsibility for reconstructing the relationship. Description: Ask the partners to generate factors that they believe contributed to the affair. Target Date: 2024-05-27 Frequency: Daily Modality: individual Progress: 90%   Problem: Partners verbally express empathy toward each other and demonstrate shared responsibility for reconstructing the relationship. Description: Hurt partner uses anxiety-management techniques to deal with intrusive thoughts about the affair. Target Date: 2024-05-27 Frequency: Daily Modality: individual Progress: 65%  Client Response full compliance  Service Location Location, [address] [place], [place] [address]  Service Code cpt [phone]  Self care activities  Provide education, information  Emotion regulation skills  Validate/empathize  Identify/label emotions  Facilitate problem solving  Comments  Patient agrees to a video (Caregility) session and understands the limitations of this platform. Patient is at home and provider in his office.   Dx.: Depression  Meds: Xanax . Tried antidep. meds, but she has significant side-effects. Goals: They are seeking counseling to resolve significant marital conflict secondary to infidelity. [name] has experienced symptoms of both anxiety and depression during this period and is hoping to reduce these symptoms as the relationship issues improve. [name] is experiencing some depressive symptoms as well, along with considerable anger and frustration. Goal date 12-25   [name] and [name]: Their son's baby is still not born and he is already seeing another woman. They are completely distraught by their son's behavior and how their ability to see grandchildren is compromised. They are together in their perceptions of their kids and how to interact with them. [name] and [name] have been  getting along better lately, with no reported flair-ups. Spending very little time at home.  CONI ALM KERNS, Ph.D.  Time 4:10p-5:00p 50 minutes                                        CONI ALM KERNS, PhD

## 2023-12-01 ENCOUNTER — Ambulatory Visit (INDEPENDENT_AMBULATORY_CARE_PROVIDER_SITE_OTHER): Admitting: Psychology

## 2023-12-01 DIAGNOSIS — F332 Major depressive disorder, recurrent severe without psychotic features: Secondary | ICD-10-CM

## 2023-12-01 DIAGNOSIS — F4322 Adjustment disorder with anxiety: Secondary | ICD-10-CM

## 2023-12-01 DIAGNOSIS — F339 Major depressive disorder, recurrent, unspecified: Secondary | ICD-10-CM

## 2023-12-01 NOTE — Progress Notes (Signed)
 \                                      Date: 12/01/2023  Treatment Plan Diagnosis 296.33 (Major depressive affective disorder, recurrent episode, severe, without mention of psychotic behavior) [n/a]  F43.22 (Adjustment Disorder, With anxiety) [n/a]  Symptoms One partner engages in sexual behavior (e.g., penile-vaginal intercourse, oral sex, anal sex) that violates the explicit or implicit expectations of the relationship. (Status: maintained) -- No Description Entered  Medication Status compliance  Safety none  If Suicidal or Homicidal State Action Taken: unspecified  Current Risk: low Medications unspecified Objectives Related Problem: Partners verbally express empathy toward each other and demonstrate shared responsibility for reconstructing the relationship. Description: Clarify what level of trust exists for various aspects of the relationship. Target Date: 2024-05-27 Frequency: Daily Modality: individual Progress: 80%  Related Problem: Partners verbally express empathy toward each other and demonstrate shared responsibility for reconstructing the relationship. Description: List relationship issues that contribute to dissatisfaction and unhappiness and agree to address these in future conjoint sessions. Target Date: 2024-05-27 Frequency: Daily Modality: individual Progress: 75%  Related Problem: Partners verbally express empathy toward each other and demonstrate shared responsibility for reconstructing the relationship. Description: Identify and list behavioral changes for self and partner that would enhance the relationship. Target Date: 2024-05-27 Frequency: Daily Modality: individual Progress: 85%  Related Problem: Partners verbally express empathy toward each other and demonstrate shared responsibility for reconstructing the relationship. Description: Describe pre-affair relationship functioning. Target  Date: 2024-05-27 Frequency: Daily Modality: individual Progress: 90%  Related Problem: Partners verbally express empathy toward each other and demonstrate shared responsibility for reconstructing the relationship. Description: Ask the partners to generate factors that they believe contributed to the affair. Target Date: 2024-05-27 Frequency: Daily Modality: individual Progress: 90%   Problem: Partners verbally express empathy toward each other and demonstrate shared responsibility for reconstructing the relationship. Description: Hurt partner uses anxiety-management techniques to deal with intrusive thoughts about the affair. Target Date: 2024-05-27 Frequency: Daily Modality: individual Progress: 65%  Client Response full compliance  Service Location Location, [address] [place], [place] [address]  Service Code cpt [phone]  Self care activities  Provide education, information  Emotion regulation skills  Validate/empathize  Identify/label emotions  Facilitate problem solving  Comments  Patient agrees to a video (Caregility) session and understands the limitations of this platform. Patient is at home and provider in his office.   Dx.: Depression  Meds: Xanax . Tried antidep. meds, but she has significant side-effects. Goals: They are seeking counseling to resolve significant marital conflict secondary to infidelity. [name] has experienced symptoms of both anxiety and depression during this period and is hoping to reduce these symptoms as the relationship issues improve. [name] is experiencing some depressive symptoms as well, along with considerable anger and frustration. Goal date 12-25   [name] and [name]: Their grandchild was born last week. The mother and their son are back together for now. [name] and [name] visited with the baby in the hospital. Their son is staying at the baby mama's house, except on days that she has her son. Son had court Monday about child support  with first baby mama. [name] has trouble trusting his parents, but easily trusts the mother's of his children. We discussed their desired role with grandchildren and the reality of what they  have to manage. They are on same page for the most part and need to make sure they remain supportive of each other.                                                                                                                                       CONI ALM KERNS, Ph.D.  Time 5:10p-6:00p 50 minutes

## 2023-12-07 ENCOUNTER — Other Ambulatory Visit (HOSPITAL_COMMUNITY): Payer: Self-pay

## 2023-12-07 ENCOUNTER — Encounter: Payer: Self-pay | Admitting: Family Medicine

## 2023-12-07 ENCOUNTER — Other Ambulatory Visit: Payer: Self-pay

## 2023-12-07 ENCOUNTER — Telehealth (INDEPENDENT_AMBULATORY_CARE_PROVIDER_SITE_OTHER): Admitting: Family Medicine

## 2023-12-07 ENCOUNTER — Ambulatory Visit (INDEPENDENT_AMBULATORY_CARE_PROVIDER_SITE_OTHER): Admitting: Psychology

## 2023-12-07 DIAGNOSIS — F339 Major depressive disorder, recurrent, unspecified: Secondary | ICD-10-CM

## 2023-12-07 DIAGNOSIS — F4322 Adjustment disorder with anxiety: Secondary | ICD-10-CM | POA: Diagnosis not present

## 2023-12-07 DIAGNOSIS — G8929 Other chronic pain: Secondary | ICD-10-CM

## 2023-12-07 DIAGNOSIS — M48061 Spinal stenosis, lumbar region without neurogenic claudication: Secondary | ICD-10-CM | POA: Diagnosis not present

## 2023-12-07 DIAGNOSIS — F332 Major depressive disorder, recurrent severe without psychotic features: Secondary | ICD-10-CM | POA: Diagnosis not present

## 2023-12-07 MED ORDER — OXYCODONE HCL 15 MG PO TABS
15.0000 mg | ORAL_TABLET | ORAL | 0 refills | Status: DC | PRN
  Filled 2023-12-07 – 2024-01-11 (×2): qty 180, 30d supply, fill #0

## 2023-12-07 MED ORDER — GABAPENTIN 100 MG PO CAPS
100.0000 mg | ORAL_CAPSULE | Freq: Three times a day (TID) | ORAL | 3 refills | Status: DC
  Filled 2023-12-07: qty 90, 30d supply, fill #0
  Filled 2024-01-18: qty 90, 30d supply, fill #1

## 2023-12-07 MED ORDER — OXYCODONE HCL 15 MG PO TABS
15.0000 mg | ORAL_TABLET | ORAL | 0 refills | Status: DC | PRN
  Filled 2023-12-07: qty 180, 30d supply, fill #0

## 2023-12-07 MED ORDER — HYDROMORPHONE HCL 8 MG PO TABS
8.0000 mg | ORAL_TABLET | Freq: Every day | ORAL | 0 refills | Status: DC
Start: 2023-12-07 — End: 2024-03-10
  Filled 2023-12-07 – 2024-03-08 (×2): qty 30, 30d supply, fill #0

## 2023-12-07 MED ORDER — HYDROMORPHONE HCL 8 MG PO TABS
8.0000 mg | ORAL_TABLET | Freq: Every day | ORAL | 0 refills | Status: DC
  Filled 2023-12-07: qty 30, 30d supply, fill #0

## 2023-12-07 MED ORDER — HYDROMORPHONE HCL 8 MG PO TABS
8.0000 mg | ORAL_TABLET | Freq: Every day | ORAL | 0 refills | Status: DC
  Filled 2023-12-07 – 2024-02-15 (×2): qty 30, 30d supply, fill #0

## 2023-12-07 MED ORDER — OXYCODONE HCL 15 MG PO TABS
15.0000 mg | ORAL_TABLET | ORAL | 0 refills | Status: DC | PRN
  Filled 2023-12-07 – 2024-02-08 (×3): qty 180, 30d supply, fill #0
  Filled ????-??-??: fill #0

## 2023-12-07 NOTE — Progress Notes (Signed)
 Subjective:    Patient ID: [name], female    DOB: [birth year], 48 y.o.   MRN: [id number]  HPI Virtual Visit via Video Note  I connected with the patient on 12/07/23 at  3:30 PM EDT by a video enabled telemedicine application and verified that I am speaking with the correct person using two identifiers.  Location patient: home Location provider:work or home office Persons participating in the virtual visit: patient, provider  I discussed the limitations of evaluation and management by telemedicine and the availability of in person appointments. The patient expressed understanding and agreed to proceed.   HPI: Here for pain management. Her back pain has been worse recently, and she wants to increase the dose of her Gabapentin .                                                                   ROS: See pertinent positives and negatives per HPI.  Past Medical History:  Diagnosis Date   Allergy    Anxiety    Arthritis    B12 deficiency    Chronic narcotic use    Depression    not currently    Dysrhythmia    hx tachy-was from medication nortriptylline   Esophagitis    rx   Fibromyalgia    GERD (gastroesophageal reflux disease)    History of echocardiogram    Echo 4/18: Mild concentric LVH, vigorous LVF, EF 65-70, normal wall motion, grade 1 diastolic dysfunction   History of kidney stones    Hyperlipidemia    Intervertebral disc protrusion 01/11/2014   Migraine    Obesity (BMI 35.0-39.9 without comorbidity)    Peripheral neuropathy    Polycystic ovaries    Pulmonary embolus (HCC)    Spinal stenosis, lumbar    Vitamin D  deficiency     Past Surgical History:  Procedure Laterality Date   35 HOUR PH STUDY N/A 05/27/2016   Procedure: 24 HOUR PH STUDY;  Surgeon: Elspeth Deward Naval, MD;  Location: [facility];  Service: Gastroenterology;  Laterality: N/A;   ABDOMINAL EXPOSURE N/A 05/04/2018   Procedure: ABDOMINAL EXPOSURE;  Surgeon: Oris Krystal FALCON, MD;  Location:  [facility] OR;  Service: Vascular;  Laterality: N/A;   ANTERIOR LUMBAR FUSION N/A 05/04/2018   Procedure: Lumbar Five Sacral One Anterior lumbar interbody fusion;  Surgeon: Onetha Kuba, MD;  Location: [facility] OR;  Service: Neurosurgery;  Laterality: N/A;  Lumbar Five Sacral One Anterior lumbar interbody fusion   APPENDECTOMY     BACK SURGERY  2012   left laminectomy at L3-4 per Dr. Amon    bladder tack  2012   CHOLECYSTECTOMY N/A 10/03/2015   Procedure: LAPAROSCOPIC CHOLECYSTECTOMY;  Surgeon: Herlene Beverley Bureau, MD;  Location: [facility] OR;  Service: General;  Laterality: N/A;   ESOPHAGEAL MANOMETRY N/A 05/27/2016   Procedure: ESOPHAGEAL MANOMETRY (EM);  Surgeon: Elspeth Deward Naval, MD;  Location: [facility];  Service: Gastroenterology;  Laterality: N/A;   EXCISION OF SKIN TAG  10/03/2015   Procedure: EXCISION OF SKIN TAG;  Surgeon: Herlene Beverley Kinsinger, MD;  Location: [facility] OR;  Service: General;;   LUMBAR DISC SURGERY  01-31-14   per Dr. Onetha    OOPHORECTOMY     left overy   OVARIAN CYST REMOVAL  RECTOCELE REPAIR     SINUSOTOMY  12/14/06   spinal fusion  2016/10   TONSILLECTOMY     TONSILLECTOMY     VAGINAL HYSTERECTOMY     WRIST GANGLION EXCISION Left     Family History  Problem Relation Age of Onset   Fibromyalgia Mother    Diabetes Mother    Thyroid  cancer Mother    Liver disease Mother    Neuropathy Mother    Cirrhosis Mother        non-alcoholic   Pulmonary embolism Mother        both lungs   Heart failure Mother    Hypertension Father    Diabetes Father    Heart disease Father    Prostate cancer Father    Heart attack Father        x 2   Colon cancer Neg Hx    Other Neg Hx        pheochromocytoma   Colon polyps Neg Hx      Current Outpatient Medications:    albuterol  (VENTOLIN  HFA) 108 (90 Base) MCG/ACT inhaler, Inhale 2 puffs into the lungs every 6 (six) hours as needed for wheezing or shortness of breath., Disp: 8 g, Rfl: 2   ALPRAZolam  (XANAX ) 0.5 MG tablet, TAKE 1  TABLET BY MOUTH THREE TIMES A DAY AS NEEDED, Disp: 90 tablet, Rfl: 5   amphetamine -dextroamphetamine  (ADDERALL  XR) 30 MG 24 hr capsule, Take 1 capsule (30 mg total) by mouth daily., Disp: 30 capsule, Rfl: 0   amphetamine -dextroamphetamine  (ADDERALL  XR) 30 MG 24 hr capsule, Take 1 capsule (30 mg total) by mouth daily., Disp: 30 capsule, Rfl: 0   amphetamine -dextroamphetamine  (ADDERALL  XR) 30 MG 24 hr capsule, Take 1 capsule (30 mg total) by mouth daily., Disp: 30 capsule, Rfl: 0   B-D 3CC LUER-LOK SYR 25GX1 25G X 1 3 ML MISC, USE AS DIRECTED, Disp: 9 each, Rfl: 2   cetirizine  (ZYRTEC ) 10 MG tablet, Take 1 tablet (10 mg total) by mouth daily., Disp: 90 tablet, Rfl: 3   clobetasol  (TEMOVATE ) 0.05 % external solution, clobetasol  0.05 % scalp solution, Disp: , Rfl:    cyanocobalamin  (VITAMIN B12) 1000 MCG/ML injection, Inject 1 mL (1,000 mcg total) into the muscle once a week., Disp: 30 mL, Rfl: 11   cyclobenzaprine  (FLEXERIL ) 10 MG tablet, Take 1 tablet (10 mg total) by mouth 3 (three) times daily as needed for muscle spasms., Disp: 90 tablet, Rfl: 5   esomeprazole  (NEXIUM ) 40 MG capsule, Take 1 capsule (40 mg total) by mouth daily., Disp: 90 capsule, Rfl: 0   fluconazole  (DIFLUCAN ) 150 MG tablet, TAKE 1 TABLET BY MOUTH EVERY DAY, Disp: 5 tablet, Rfl: 5   Fluocinolone Acetonide Body 0.01 % OIL, fluocinolone 0.01 % scalp oil and shower cap, Disp: , Rfl:    fluticasone  (FLONASE ) 50 MCG/ACT nasal spray, Place 2 sprays into both nostrils daily., Disp: 16 g, Rfl: 6   gabapentin  (NEURONTIN ) 300 MG capsule, Take 1 capsule (300 mg total) by mouth 3 (three) times daily., Disp: 270 capsule, Rfl: 1   hydrochlorothiazide  (HYDRODIURIL ) 25 MG tablet, Take 1 tablet (25 mg total) by mouth daily., Disp: 90 tablet, Rfl: 0   HYDROmorphone  (DILAUDID ) 8 MG tablet, Take 1 tablet (8 mg) by mouth at bedtime., Disp: 30 tablet, Rfl: 0   HYDROmorphone  (DILAUDID ) 8 MG tablet, Take 1 tablet (8 mg) by mouth at bedtime., Disp: 30  tablet, Rfl: 0   HYDROmorphone  (DILAUDID ) 8 MG tablet, Take 1 tablet (8  mg) by mouth at bedtime., Disp: 30 tablet, Rfl: 0   lidocaine  (LIDODERM ) 5 %, Place 1 patch onto the skin daily. Remove & Discard patch within 12 hours or as directed by MD, Disp: 30 patch, Rfl: 5   Multiple Vitamins-Minerals (MULTI-VITAMIN GUMMIES PO), Take 2 tablets by mouth daily., Disp: , Rfl:    oxyCODONE  (ROXICODONE ) 15 MG immediate release tablet, Take 1 tablet (15 mg total) by mouth every 4 (four) hours as needed for pain., Disp: 180 tablet, Rfl: 0   oxyCODONE  (ROXICODONE ) 15 MG immediate release tablet, Take 1 tablet (15 mg total) by mouth every 4 (four) hours as needed for pain., Disp: 180 tablet, Rfl: 0   oxyCODONE  (ROXICODONE ) 15 MG immediate release tablet, Take 1 tablet (15 mg total) by mouth every 4 (four) hours as needed for pain., Disp: 180 tablet, Rfl: 0   pantoprazole  (PROTONIX ) 40 MG tablet, Take 1 tablet (40 mg total) by mouth 2 (two) times daily before a meal., Disp: 180 tablet, Rfl: 3   phenazopyridine  (PYRIDIUM ) 100 MG tablet, Take 1 tablet (100 mg total) by mouth 3 (three) times daily as needed for pain., Disp: 10 tablet, Rfl: 0   potassium chloride  SA (KLOR-CON  M) 20 MEQ tablet, Take 2 tablets (40 mEq total) by mouth daily for 7 days., Disp: 90 tablet, Rfl: 3   promethazine  (PHENERGAN ) 25 MG tablet, Take 1 tablet (25 mg total) by mouth every 4 (four) hours as needed for nausea or vomiting., Disp: 90 tablet, Rfl: 5   Semaglutide -Weight Management (WEGOVY ) 2.4 MG/0.75ML SOAJ, Inject 2.4 mg into the skin once a week., Disp: 9 mL, Rfl: 3   Vitamin D , Ergocalciferol , (DRISDOL ) 1.25 MG (50000 UNIT) CAPS capsule, Take 1 capsule by mouth every 7 days., Disp: 12 capsule, Rfl: 3   atorvastatin  (LIPITOR) 20 MG tablet, Take 1 tablet (20 mg total) by mouth daily. (Patient not taking: Reported on 12/07/2023), Disp: 90 tablet, Rfl: 3   benzonatate  (TESSALON  PERLES) 100 MG capsule, 1-2 capsules up to twice daily as needed  for cough. (Patient not taking: Reported on 12/07/2023), Disp: 30 capsule, Rfl: 0   predniSONE  (DELTASONE ) 20 MG tablet, Take 2 tablets (40 mg total) by mouth daily with breakfast. (Patient not taking: Reported on 12/07/2023), Disp: 8 tablet, Rfl: 0   SUMAtriptan  (IMITREX ) 100 MG tablet, Take 1 tablet earliest onset of migraine.  May repeat once in 2 hours if headache persists or recurs. (Patient not taking: Reported on 12/07/2023), Disp: 10 tablet, Rfl: 2  EXAM:  VITALS per patient if applicable:  GENERAL: alert, oriented, appears well and in no acute distress  HEENT: atraumatic, conjunttiva clear, no obvious abnormalities on inspection of external nose and ears  NECK: normal movements of the head and neck  LUNGS: on inspection no signs of respiratory distress, breathing rate appears normal, no obvious gross SOB, gasping or wheezing  CV: no obvious cyanosis  MS: moves all visible extremities without noticeable abnormality  PSYCH/NEURO: pleasant and cooperative, no obvious depression or anxiety, speech and thought processing grossly intact  ASSESSMENT AND PLAN: Pain management.  Indication for chronic opioid: low back pain Medication and dose: Oxycodone  15 mg and Hydromorphone  8 mg # pills per month: 180 and 30 Last UDS date: 08-27-23 Opioid Treatment Agreement signed (Y/N): 04-17-19 Opioid Treatment Agreement last reviewed with patient:  12-07-23 [facility] reviewed this encounter (include red flags): Yes We will refill the Oxycodone  and Hydromorphone , and we will increase the Gabapentin  to a total of 400 mg TID.  Garnette Olmsted, MD  Discussed the following assessment and plan:  No diagnosis found.     I discussed the assessment and treatment plan with the patient. The patient was provided an opportunity to ask questions and all were answered. The patient agreed with the plan and demonstrated an understanding of the instructions.   The patient was advised to call back or seek an in-person  evaluation if the symptoms worsen or if the condition fails to improve as anticipated.      Review of Systems     Objective:   Physical Exam        Assessment & Plan:

## 2023-12-07 NOTE — Progress Notes (Signed)
 \                                      Date: 12/07/2023  Treatment Plan Diagnosis 296.33 (Major depressive affective disorder, recurrent episode, severe, without mention of psychotic behavior) [n/a]  F43.22 (Adjustment Disorder, With anxiety) [n/a]  Symptoms One partner engages in sexual behavior (e.g., penile-vaginal intercourse, oral sex, anal sex) that violates the explicit or implicit expectations of the relationship. (Status: maintained) -- No Description Entered  Medication Status compliance  Safety none  If Suicidal or Homicidal State Action Taken: unspecified  Current Risk: low Medications unspecified Objectives Related Problem: Partners verbally express empathy toward each other and demonstrate shared responsibility for reconstructing the relationship. Description: Clarify what level of trust exists for various aspects of the relationship. Target Date: 2024-05-27 Frequency: Daily Modality: individual Progress: 80%  Related Problem: Partners verbally express empathy toward each other and demonstrate shared responsibility for reconstructing the relationship. Description: List relationship issues that contribute to dissatisfaction and unhappiness and agree to address these in future conjoint sessions. Target Date: 2024-05-27 Frequency: Daily Modality: individual Progress: 75%  Related Problem: Partners verbally express empathy toward each other and demonstrate shared responsibility for reconstructing the relationship. Description: Identify and list behavioral changes for self and partner that would enhance the relationship. Target Date: 2024-05-27 Frequency: Daily Modality: individual Progress: 85%  Related Problem: Partners verbally express empathy toward each other and demonstrate shared responsibility for reconstructing the relationship. Description: Describe pre-affair  relationship functioning. Target Date: 2024-05-27 Frequency: Daily Modality: individual Progress: 90%  Related Problem: Partners verbally express empathy toward each other and demonstrate shared responsibility for reconstructing the relationship. Description: Ask the partners to generate factors that they believe contributed to the affair. Target Date: 2024-05-27 Frequency: Daily Modality: individual Progress: 90%   Problem: Partners verbally express empathy toward each other and demonstrate shared responsibility for reconstructing the relationship. Description: Hurt partner uses anxiety-management techniques to deal with intrusive thoughts about the affair. Target Date: 2024-05-27 Frequency: Daily Modality: individual Progress: 65%  Client Response full compliance  Service Location Location, [address] [place], [place] [address]  Service Code cpt [phone]  Self care activities  Provide education, information  Emotion regulation skills  Validate/empathize  Identify/label emotions  Facilitate problem solving  Comments  Patient agrees to a video (Caregility) session and understands the limitations of this platform. Patient is at home and provider in his office.   Dx.: Depression  Meds: Xanax . Tried antidep. meds, but she has significant side-effects. Goals: They are seeking counseling to resolve significant marital conflict secondary to infidelity. [name] has experienced symptoms of both anxiety and depression during this period and is hoping to reduce these symptoms as the relationship issues improve. [name] is experiencing some depressive symptoms as well, along with considerable anger and frustration. Goal date 12-25   [name] and [name]: They said their son is still trying to work things out with the current baby mama. She still gets caught up in the conflicts in son's relationship. We talked about establishing more appropriate boundaries. They continue to repeat the same  patterns. They agree to NOT rescue [name].  CONI ALM KERNS, Ph.D.  Time 4:10p-5:00p 50 minutes

## 2023-12-08 ENCOUNTER — Other Ambulatory Visit (HOSPITAL_COMMUNITY): Payer: Self-pay

## 2023-12-08 ENCOUNTER — Other Ambulatory Visit: Payer: Self-pay

## 2023-12-15 ENCOUNTER — Telehealth: Payer: Self-pay

## 2023-12-15 ENCOUNTER — Other Ambulatory Visit (HOSPITAL_COMMUNITY): Payer: Self-pay

## 2023-12-15 ENCOUNTER — Encounter: Payer: Self-pay | Admitting: Family Medicine

## 2023-12-15 ENCOUNTER — Ambulatory Visit (INDEPENDENT_AMBULATORY_CARE_PROVIDER_SITE_OTHER): Admitting: Psychology

## 2023-12-15 DIAGNOSIS — F332 Major depressive disorder, recurrent severe without psychotic features: Secondary | ICD-10-CM | POA: Diagnosis not present

## 2023-12-15 DIAGNOSIS — F4322 Adjustment disorder with anxiety: Secondary | ICD-10-CM | POA: Diagnosis not present

## 2023-12-15 DIAGNOSIS — F339 Major depressive disorder, recurrent, unspecified: Secondary | ICD-10-CM

## 2023-12-15 NOTE — Progress Notes (Signed)
 \                                      Date: 12/15/2023  Treatment Plan Diagnosis 296.33 (Major depressive affective disorder, recurrent episode, severe, without mention of psychotic behavior) [n/a]  F43.22 (Adjustment Disorder, With anxiety) [n/a]  Symptoms One partner engages in sexual behavior (e.g., penile-vaginal intercourse, oral sex, anal sex) that violates the explicit or implicit expectations of the relationship. (Status: maintained) -- No Description Entered  Medication Status compliance  Safety none  If Suicidal or Homicidal State Action Taken: unspecified  Current Risk: low Medications unspecified Objectives Related Problem: Partners verbally express empathy toward each other and demonstrate shared responsibility for reconstructing the relationship. Description: Clarify what level of trust exists for various aspects of the relationship. Target Date: 2024-05-27 Frequency: Daily Modality: individual Progress: 80%  Related Problem: Partners verbally express empathy toward each other and demonstrate shared responsibility for reconstructing the relationship. Description: List relationship issues that contribute to dissatisfaction and unhappiness and agree to address these in future conjoint sessions. Target Date: 2024-05-27 Frequency: Daily Modality: individual Progress: 75%  Related Problem: Partners verbally express empathy toward each other and demonstrate shared responsibility for reconstructing the relationship. Description: Identify and list behavioral changes for self and partner that would enhance the relationship. Target Date: 2024-05-27 Frequency: Daily Modality: individual Progress: 85%  Related Problem: Partners verbally express empathy toward each other and demonstrate shared responsibility for reconstructing the  relationship. Description: Describe pre-affair relationship functioning. Target Date: 2024-05-27 Frequency: Daily Modality: individual Progress: 90%  Related Problem: Partners verbally express empathy toward each other and demonstrate shared responsibility for reconstructing the relationship. Description: Ask the partners to generate factors that they believe contributed to the affair. Target Date: 2024-05-27 Frequency: Daily Modality: individual Progress: 90%   Problem: Partners verbally express empathy toward each other and demonstrate shared responsibility for reconstructing the relationship. Description: Hurt partner uses anxiety-management techniques to deal with intrusive thoughts about the affair. Target Date: 2024-05-27 Frequency: Daily Modality: individual Progress: 65%  Client Response full compliance  Service Location Location, [address] [place], [place] [address]  Service Code cpt [phone]  Self care activities  Provide education, information  Emotion regulation skills  Validate/empathize  Identify/label emotions  Facilitate problem solving  Comments  Patient agrees to a video (Caregility) session and understands the limitations of this platform. Patient is at home and provider in his office.   Dx.: Depression  Meds: Xanax . Tried antidep. meds, but she has significant side-effects. Goals: They are seeking counseling to resolve significant marital conflict secondary to infidelity. [name] has experienced symptoms of both anxiety and depression during this period and is hoping to reduce these symptoms as the relationship issues improve. [name] is experiencing some depressive symptoms as well, along with considerable anger and frustration. Goal date 12-25   [name] and [name]: He is having some more trouble at work. They talked about their financial stress and all of their gifting to their 2 children and the additional 2 they help raise (sister's kids). One of the  girls is thriving while the other is struggling (living with [name]'s father) and not going to  graduate high school. There are ongoing issues/problems with her family, particularly [name]'s sister. They did have a blow-up, and we processed how it could have been handled in a more respectful way.                                                                                                                                            CONI ALM KERNS, Ph.D.  Time 5:10p-6:00p 50 minutes

## 2023-12-15 NOTE — Telephone Encounter (Signed)
 Insurance companies are becoming increasingly stricter about requiring thorough documentation of lifestyle modifications in the patient's chart at each visit. This includes detailed records of diet recommendations (caloric intake, etc), exercise plans (amount of time/wk, etc), and an emphasis on the patient's commitment to continuing these efforts while on medication.  Without this additional documentation in the chart notes, a prior authorization will most likely be denied.    Need new chart notes with current and baseline weight. Current ICD 10 codes.

## 2023-12-16 ENCOUNTER — Other Ambulatory Visit: Payer: Self-pay

## 2023-12-16 ENCOUNTER — Other Ambulatory Visit: Payer: Self-pay | Admitting: Family Medicine

## 2023-12-16 NOTE — Telephone Encounter (Signed)
 Set up another OV with me to document all these things

## 2023-12-17 ENCOUNTER — Other Ambulatory Visit: Payer: Self-pay

## 2023-12-17 ENCOUNTER — Other Ambulatory Visit (HOSPITAL_COMMUNITY): Payer: Self-pay

## 2023-12-20 ENCOUNTER — Other Ambulatory Visit (HOSPITAL_BASED_OUTPATIENT_CLINIC_OR_DEPARTMENT_OTHER): Payer: Self-pay

## 2023-12-20 ENCOUNTER — Other Ambulatory Visit (HOSPITAL_COMMUNITY): Payer: Self-pay

## 2023-12-20 MED ORDER — AMPHETAMINE-DEXTROAMPHET ER 30 MG PO CP24
30.0000 mg | ORAL_CAPSULE | Freq: Every day | ORAL | 0 refills | Status: DC
  Filled 2023-12-20 – 2024-01-18 (×2): qty 30, 30d supply, fill #0

## 2023-12-20 MED ORDER — AMPHETAMINE-DEXTROAMPHET ER 30 MG PO CP24
30.0000 mg | ORAL_CAPSULE | Freq: Every day | ORAL | 0 refills | Status: DC
  Filled 2023-12-20 – 2024-02-15 (×2): qty 30, 30d supply, fill #0

## 2023-12-20 MED ORDER — AMPHETAMINE-DEXTROAMPHET ER 30 MG PO CP24
30.0000 mg | ORAL_CAPSULE | Freq: Every day | ORAL | 0 refills | Status: DC
  Filled 2023-12-20: qty 30, 30d supply, fill #0

## 2023-12-20 NOTE — Telephone Encounter (Signed)
 Done

## 2023-12-21 ENCOUNTER — Ambulatory Visit (INDEPENDENT_AMBULATORY_CARE_PROVIDER_SITE_OTHER): Admitting: Psychology

## 2023-12-21 DIAGNOSIS — F332 Major depressive disorder, recurrent severe without psychotic features: Secondary | ICD-10-CM

## 2023-12-21 DIAGNOSIS — F4322 Adjustment disorder with anxiety: Secondary | ICD-10-CM

## 2023-12-21 DIAGNOSIS — F339 Major depressive disorder, recurrent, unspecified: Secondary | ICD-10-CM
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
## 2023-12-27 NOTE — Telephone Encounter (Signed)
 I have one month's supply of Weygovy samples she can use until she sees us 

## 2023-12-29 ENCOUNTER — Ambulatory Visit (INDEPENDENT_AMBULATORY_CARE_PROVIDER_SITE_OTHER): Admitting: Psychology

## 2023-12-29 DIAGNOSIS — F339 Major depressive disorder, recurrent, unspecified: Secondary | ICD-10-CM

## 2023-12-29 DIAGNOSIS — F332 Major depressive disorder, recurrent severe without psychotic features: Secondary | ICD-10-CM

## 2023-12-29 DIAGNOSIS — F4322 Adjustment disorder with anxiety: Secondary | ICD-10-CM
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
## 2024-01-04 ENCOUNTER — Ambulatory Visit: Admitting: Psychology

## 2024-01-04 DIAGNOSIS — F339 Major depressive disorder, recurrent, unspecified: Secondary | ICD-10-CM

## 2024-01-04 DIAGNOSIS — F332 Major depressive disorder, recurrent severe without psychotic features: Secondary | ICD-10-CM

## 2024-01-04 DIAGNOSIS — F4322 Adjustment disorder with anxiety: Secondary | ICD-10-CM
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
## 2024-01-06 NOTE — Telephone Encounter (Signed)
 They usually only bring us  samples for the starting dose. She can use as many of these together as needed to make a total of 2.4 mg. After that she sill need to purchase them

## 2024-01-11 ENCOUNTER — Other Ambulatory Visit: Payer: Self-pay

## 2024-01-11 ENCOUNTER — Other Ambulatory Visit (HOSPITAL_COMMUNITY): Payer: Self-pay

## 2024-01-12 ENCOUNTER — Ambulatory Visit: Admitting: Psychology

## 2024-01-12 DIAGNOSIS — F332 Major depressive disorder, recurrent severe without psychotic features: Secondary | ICD-10-CM | POA: Diagnosis not present

## 2024-01-12 DIAGNOSIS — F4322 Adjustment disorder with anxiety: Secondary | ICD-10-CM

## 2024-01-12 DIAGNOSIS — F339 Major depressive disorder, recurrent, unspecified: Secondary | ICD-10-CM
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
## 2024-01-18 ENCOUNTER — Ambulatory Visit: Admitting: Psychology

## 2024-01-18 ENCOUNTER — Other Ambulatory Visit (HOSPITAL_COMMUNITY): Payer: Self-pay

## 2024-01-19 ENCOUNTER — Other Ambulatory Visit (HOSPITAL_COMMUNITY): Payer: Self-pay

## 2024-01-20 ENCOUNTER — Ambulatory Visit (INDEPENDENT_AMBULATORY_CARE_PROVIDER_SITE_OTHER): Admitting: Psychology

## 2024-01-20 DIAGNOSIS — F4322 Adjustment disorder with anxiety: Secondary | ICD-10-CM

## 2024-01-20 DIAGNOSIS — F339 Major depressive disorder, recurrent, unspecified: Secondary | ICD-10-CM

## 2024-01-20 DIAGNOSIS — F332 Major depressive disorder, recurrent severe without psychotic features: Secondary | ICD-10-CM | POA: Diagnosis not present
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
## 2024-01-25 ENCOUNTER — Encounter: Payer: Self-pay | Admitting: Family Medicine

## 2024-01-26 ENCOUNTER — Ambulatory Visit (INDEPENDENT_AMBULATORY_CARE_PROVIDER_SITE_OTHER): Admitting: Psychology

## 2024-01-26 DIAGNOSIS — F4322 Adjustment disorder with anxiety: Secondary | ICD-10-CM | POA: Diagnosis not present

## 2024-01-26 DIAGNOSIS — F332 Major depressive disorder, recurrent severe without psychotic features: Secondary | ICD-10-CM | POA: Diagnosis not present

## 2024-01-26 DIAGNOSIS — F339 Major depressive disorder, recurrent, unspecified: Secondary | ICD-10-CM

## 2024-01-26 NOTE — Progress Notes (Signed)
 \                                      Date: 01/26/2024  Treatment Plan Diagnosis 296.33 (Major depressive affective disorder, recurrent episode, severe, without mention of psychotic behavior) [n/a]  F43.22 (Adjustment Disorder, With anxiety) [n/a]  Symptoms One partner engages in sexual behavior (e.g., penile-vaginal intercourse, oral sex, anal sex) that violates the explicit or implicit expectations of the relationship. (Status: maintained) -- No Description Entered  Medication Status compliance  Safety none  If Suicidal or Homicidal State Action Taken: unspecified  Current Risk: low Medications unspecified Objectives Related Problem: Partners verbally express empathy toward each other and demonstrate shared responsibility for reconstructing the relationship. Description: Clarify what level of trust exists for various aspects of the relationship. Target Date: 2024-05-27 Frequency: Daily Modality: individual Progress: 80%  Related Problem: Partners verbally express empathy toward each other and demonstrate shared responsibility for reconstructing the relationship. Description: List relationship issues that contribute to dissatisfaction and unhappiness and agree to address these in future conjoint sessions. Target Date: 2024-05-27 Frequency: Daily Modality: individual Progress: 75%  Related Problem: Partners verbally express empathy toward each other and demonstrate shared responsibility for reconstructing the relationship. Description: Identify and list behavioral changes for self and partner that would enhance the relationship. Target Date: 2024-05-27 Frequency: Daily Modality: individual Progress: 85%   Related Problem: Partners verbally express empathy toward each other and demonstrate shared responsibility for reconstructing the relationship. Description: Describe pre-affair relationship functioning. Target Date: 2024-05-27 Frequency: Daily Modality: individual Progress: 90%  Related Problem: Partners verbally express empathy toward each other and demonstrate shared responsibility for reconstructing the relationship. Description: Ask the partners to generate factors that they believe contributed to the affair. Target Date: 2024-05-27 Frequency: Daily Modality: individual Progress: 90%   Problem: Partners verbally express empathy toward each other and demonstrate shared responsibility for reconstructing the relationship. Description: Hurt partner uses anxiety-management techniques to deal with intrusive thoughts about the affair. Target Date: 2024-05-27 Frequency: Daily Modality: individual Progress: 65%  Client Response full compliance  Service Location Location, [address] [place], [place] [address]  Service Code cpt [phone]  Self care activities  Provide education, information  Emotion regulation skills  Validate/empathize  Identify/label emotions  Facilitate problem solving  Comments  Patient agrees to a video (Caregility) session and understands the limitations of this platform. Patient is at home and provider in his office.   Dx.: Depression  Meds: Xanax . Tried antidep. meds, but she has significant side-effects. Goals: They are seeking counseling to resolve significant marital conflict secondary to infidelity. Cena has experienced symptoms of both anxiety and depression during this period and is hoping to reduce these symptoms as the relationship issues  improve. Marolyn is experiencing some depressive symptoms as well, along with considerable anger and frustration. Goal date 12-25   [name] and [name]: Addressed their overdue balance. She acknowledged that she  keeps forgetting and has already called the office to make arrangements. Also, said they will keep their co-pays up to date. Son had court on Monday and he is now in jail for 30 days. His girlfriend got scammed for 1500 trying to get him out of jail. Their son also signed away his rights to his son, but it is unclear if it is legally binding. They are not sure what is going to happen next, but looks like son will stay in jail for 30 days. They are also having issues with daughter. He asked daughter what her goal end game is and why is she treating them so poorly. Her daughter is now giving up on therapy. [name] told his daughter that she is not to speak with him with disrespect. The relationship is strained and uncertain where it will go. We talked about the best strategy they can attempt, but also need to recognize the limits of what they can do.                          CONI ALM KERNS, Ph.D.  Time 5:15p-6:00p 45 minutes

## 2024-02-01 ENCOUNTER — Ambulatory Visit: Admitting: Psychology

## 2024-02-01 DIAGNOSIS — F332 Major depressive disorder, recurrent severe without psychotic features: Secondary | ICD-10-CM | POA: Diagnosis not present

## 2024-02-01 DIAGNOSIS — F4322 Adjustment disorder with anxiety: Secondary | ICD-10-CM

## 2024-02-01 DIAGNOSIS — F339 Major depressive disorder, recurrent, unspecified: Secondary | ICD-10-CM

## 2024-02-01 NOTE — Progress Notes (Signed)
 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 (Status: maintained) -- No Description Entered  Medication Status compliance  Safety none  If Suicidal or Homicidal State Action Taken: unspecified  Current Risk: low Medications unspecified Objectives Related Problem: Partners verbally express empathy toward each other and demonstrate shared responsibility for reconstructing the relationship. Description: Clarify what level of trust exists for various aspects of the relationship. Target Date: 2024-05-27 Frequency: Daily Modality: individual Progress: 80%  Related Problem: Partners verbally express empathy toward each other and demonstrate shared responsibility for reconstructing the relationship. Description: List relationship issues that contribute to dissatisfaction and unhappiness and agree to address these in future conjoint sessions. Target Date: 2024-05-27 Frequency: Daily Modality: individual Progress: 75%  Related Problem: Partners verbally express empathy toward each other and demonstrate shared responsibility for reconstructing the relationship. Description: Identify and list behavioral changes for self and partner that would enhance the relationship. Target Date: 2024-05-27 Frequency: Daily Modality:  individual Progress: 85%  Related Problem: Partners verbally express empathy toward each other and demonstrate shared responsibility for reconstructing the relationship. Description: Describe pre-affair relationship functioning. Target Date: 2024-05-27 Frequency: Daily Modality: individual Progress: 90%  Related Problem: Partners verbally express empathy toward each other and demonstrate shared responsibility for reconstructing the relationship. Description: Ask the partners to generate factors that they believe contributed to the affair. Target Date: 2024-05-27 Frequency: Daily Modality: individual Progress: 90%   Problem: Partners verbally express empathy toward each other and demonstrate shared responsibility for reconstructing the relationship. Description: Hurt partner uses anxiety-management techniques to deal with intrusive thoughts about the affair. Target Date: 2024-05-27 Frequency: Daily Modality: individual Progress: 65%  Client Response full compliance  Service Location Location, [address] [place], [place] [address]  Service Code cpt [phone]  Self care activities  Provide education, information  Emotion regulation skills  Validate/empathize  Identify/label emotions  Facilitate problem solving  Comments  Patient agrees to a video (Caregility) session and understands the limitations of this platform. Patient is at home and provider in his office.   Dx.: Depression  Meds: Xanax . Tried antidep. meds, but she has significant side-effects. Goals: They are seeking counseling to resolve significant marital conflict secondary to infidelity. [name] has experienced symptoms of both anxiety and  depression during this period and is hoping to reduce these symptoms as the relationship issues improve. [name] is experiencing some depressive symptoms as well, along with considerable anger and frustration. Goal date 12-25   [name] and [name]: She states that they have had a bad  week together. Says that in her opinion, [name] exploded at her when she tried explaining why package was delivered in a specific way. He admits things got out of control. Neither have an understanding of how this ongoing pattern repeats.  We also dicussed the anger their daughter has with them and the hurt they feel. We talked about the best, most productive way to respond.                             CONI ALM KERNS, Ph.D.  Time 4:05p-4:55p 50 minutes

## 2024-02-02 ENCOUNTER — Other Ambulatory Visit (HOSPITAL_COMMUNITY): Payer: Self-pay

## 2024-02-02 ENCOUNTER — Telehealth: Payer: Self-pay

## 2024-02-02 NOTE — Telephone Encounter (Signed)
 Please send  PA for Wegovy  2.4 mg, please include the attached current weight obtained through [facility] app. Please keep me updated

## 2024-02-02 NOTE — Telephone Encounter (Signed)
 Pharmacy Patient Advocate Encounter   Received notification from Pt Calls Messages that prior authorization for Wegovy  2.4 is required/requested.   Insurance verification completed.   The patient is insured through [employer] .   Per test claim: PA required; PA submitted to above mentioned insurance via Latent Key/confirmation #/[facility] Status is pending

## 2024-02-07 ENCOUNTER — Other Ambulatory Visit: Payer: Self-pay

## 2024-02-07 ENCOUNTER — Other Ambulatory Visit (HOSPITAL_COMMUNITY): Payer: Self-pay

## 2024-02-08 ENCOUNTER — Other Ambulatory Visit (HOSPITAL_COMMUNITY): Payer: Self-pay

## 2024-02-09 ENCOUNTER — Ambulatory Visit (INDEPENDENT_AMBULATORY_CARE_PROVIDER_SITE_OTHER): Admitting: Psychology

## 2024-02-09 ENCOUNTER — Other Ambulatory Visit (HOSPITAL_COMMUNITY): Payer: Self-pay

## 2024-02-09 ENCOUNTER — Other Ambulatory Visit: Payer: Self-pay | Admitting: Family Medicine

## 2024-02-09 DIAGNOSIS — F4322 Adjustment disorder with anxiety: Secondary | ICD-10-CM

## 2024-02-09 DIAGNOSIS — F332 Major depressive disorder, recurrent severe without psychotic features: Secondary | ICD-10-CM | POA: Diagnosis not present

## 2024-02-09 DIAGNOSIS — F339 Major depressive disorder, recurrent, unspecified: Secondary | ICD-10-CM

## 2024-02-09 NOTE — Telephone Encounter (Signed)
**Note De-identified  [place] Obfuscation** Please advise 

## 2024-02-09 NOTE — Telephone Encounter (Signed)
 Pharmacy Patient Advocate Encounter  Received notification from [facility] that Prior Authorization for Wegovy  2.4 has been APPROVED from 01/03/24 to 09/06/24. Ran test claim, Copay is $24.99. This test claim was processed through [facility]- copay amounts may vary at other pharmacies due to pharmacy/plan contracts, or as the patient moves through the different stages of their insurance plan.     PA #/Case ID/Reference #: [id number]

## 2024-02-09 NOTE — Progress Notes (Signed)
 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 (Status: maintained) -- No Description Entered  Medication Status compliance  Safety none  If Suicidal or Homicidal State Action Taken: unspecified  Current Risk: low Medications unspecified Objectives Related Problem: Partners verbally express empathy toward each other and demonstrate shared responsibility for reconstructing the relationship. Description: Clarify what level of trust exists for various aspects of the relationship. Target Date: 2024-05-27 Frequency: Daily Modality: individual Progress: 80%  Related Problem: Partners verbally express empathy toward each other and demonstrate shared responsibility for reconstructing the relationship. Description: List relationship issues that contribute to dissatisfaction and unhappiness and agree to address these in future conjoint sessions. Target Date: 2024-05-27 Frequency: Daily Modality: individual Progress: 75%  Related Problem: Partners verbally express empathy toward each other and demonstrate shared responsibility for reconstructing the relationship. Description: Identify and list behavioral changes for self and partner that would enhance the relationship. Target Date: 2024-05-27 Frequency: Daily Modality: individual Progress: 85%  Related Problem: Partners verbally express empathy toward each other and demonstrate shared responsibility for reconstructing the relationship. Description: Describe pre-affair relationship functioning. Target Date: 2024-05-27 Frequency: Daily Modality: individual Progress: 90%  Related Problem: Partners  verbally express empathy toward each other and demonstrate shared responsibility for reconstructing the relationship. Description: Ask the partners to generate factors that they believe contributed to the affair. Target Date: 2024-05-27 Frequency: Daily Modality: individual Progress: 90%   Problem: Partners verbally express empathy toward each other and demonstrate shared responsibility for reconstructing the relationship. Description: Hurt partner uses anxiety-management techniques to deal with intrusive thoughts about the affair. Target Date: 2024-05-27 Frequency: Daily Modality: individual Progress: 65%  Client Response full compliance  Service Location Location, [address] [place], [place] [address]  Service Code cpt [phone]  Self care activities  Provide education, information  Emotion regulation skills  Validate/empathize  Identify/label emotions  Facilitate problem solving  Comments  Patient agrees to a video (Caregility) session and understands the limitations of this platform. Patient is at home and provider in his office.   Dx.: Depression  Meds: Xanax . Tried antidep. meds, but she has significant side-effects. Goals: They are seeking counseling to resolve significant marital conflict secondary to infidelity. Cena has experienced symptoms of both anxiety and depression during this period and is hoping to reduce these symptoms as the relationship issues improve. Marolyn is experiencing some depressive symptoms as well, along with considerable anger and frustration. Goal date 12-25   [name] and [name]: Both say it was a very bad weekend. [name] tried to plead with daughter for her to invite Marolyn to the baby reveal. Daughter responded in very disrespectful way to Dale. [name] made numerous attempts to have her include [name], but she rejected. She is enraged with Marolyn and will not tell him why and with no road to recovery. She is telling them about situations from many  years ago that she never has discussed. In the middle of all of the problem communications, Cena and Marolyn got into conflict. She says she is not sure what happened, but Marolyn says that it was when Cena said that the affair is what changed the relationship with their daughter. They were unable to work out their conflict in a timely way. We talked about  reasons they get stuck.                                 CONI ALM KERNS, Ph.D.  Time 4:15p-5:00p 45 minutes

## 2024-02-15 ENCOUNTER — Other Ambulatory Visit (HOSPITAL_COMMUNITY): Payer: Self-pay

## 2024-02-15 ENCOUNTER — Other Ambulatory Visit: Payer: Self-pay

## 2024-02-15 ENCOUNTER — Ambulatory Visit: Admitting: Psychology

## 2024-02-15 ENCOUNTER — Other Ambulatory Visit: Payer: Self-pay | Admitting: Family Medicine

## 2024-02-15 DIAGNOSIS — F4322 Adjustment disorder with anxiety: Secondary | ICD-10-CM

## 2024-02-15 DIAGNOSIS — F339 Major depressive disorder, recurrent, unspecified: Secondary | ICD-10-CM

## 2024-02-15 DIAGNOSIS — F332 Major depressive disorder, recurrent severe without psychotic features: Secondary | ICD-10-CM

## 2024-02-15 MED ORDER — CETIRIZINE HCL 10 MG PO TABS
10.0000 mg | ORAL_TABLET | Freq: Every day | ORAL | 3 refills | Status: AC
  Filled 2024-02-15: qty 30, 30d supply, fill #0
  Filled 2024-03-20: qty 30, 30d supply, fill #1

## 2024-02-15 MED ORDER — HYDROCHLOROTHIAZIDE 25 MG PO TABS
25.0000 mg | ORAL_TABLET | Freq: Every day | ORAL | 3 refills | Status: AC
  Filled 2024-02-15: qty 30, 30d supply, fill #0
  Filled 2024-03-20: qty 30, 30d supply, fill #1
  Filled 2024-04-23: qty 30, 30d supply, fill #2
  Filled 2024-05-27: qty 30, 30d supply, fill #3
  Filled 2024-06-26: qty 30, 30d supply, fill #4

## 2024-02-15 MED ORDER — ESOMEPRAZOLE MAGNESIUM 40 MG PO CPDR
40.0000 mg | DELAYED_RELEASE_CAPSULE | Freq: Every day | ORAL | 3 refills | Status: AC
  Filled 2024-02-15: qty 30, 30d supply, fill #0
  Filled 2024-03-20: qty 30, 30d supply, fill #1
  Filled 2024-04-23: qty 30, 30d supply, fill #2
  Filled 2024-05-27: qty 30, 30d supply, fill #3
  Filled 2024-06-26: qty 30, 30d supply, fill #4

## 2024-02-15 NOTE — Progress Notes (Signed)
 Date: 02/15/2024  Treatment Plan Diagnosis 296.33 (Major depressive affective disorder, recurrent episode, severe, without mention of psychotic behavior) [n/a]  F43.22 (Adjustment Disorder, With anxiety) [n/a]  Symptoms One partner engages in sexual behavior (e.g., penile-vaginal intercourse, oral sex, anal sex) that violates the explicit or implicit expectations of the relationship. (Status: maintained) -- No Description Entered  Medication Status compliance  Safety none  If Suicidal or Homicidal State Action Taken: unspecified  Current Risk: low Medications unspecified Objectives Related Problem: Partners verbally express empathy toward each other and demonstrate shared responsibility for reconstructing the relationship. Description: Clarify what level of trust exists for various aspects of the relationship. Target Date: 2024-05-27 Frequency: Daily Modality: individual Progress: 80%  Related Problem: Partners verbally express empathy toward each other and demonstrate shared responsibility for reconstructing the relationship. Description: List relationship issues that contribute to dissatisfaction and unhappiness and agree to address these in future conjoint sessions. Target Date: 2024-05-27 Frequency: Daily Modality: individual Progress: 75%  Related Problem: Partners verbally express empathy toward each other and demonstrate shared responsibility for reconstructing the relationship. Description: Identify and list behavioral changes for self and partner that would enhance the relationship. Target Date: 2024-05-27 Frequency: Daily Modality: individual Progress: 85%  Related Problem: Partners verbally express empathy toward each other and demonstrate shared responsibility for reconstructing the relationship. Description: Describe pre-affair relationship functioning. Target Date: 2024-05-27 Frequency: Daily Modality: individual Progress:  90%  Related Problem: Partners verbally express empathy toward each other and demonstrate shared responsibility for reconstructing the relationship. Description: Ask the partners to generate factors that they believe contributed to the affair. Target Date: 2024-05-27 Frequency: Daily Modality: individual Progress: 90%   Problem: Partners verbally express empathy toward each other and demonstrate shared responsibility for reconstructing the relationship. Description: Hurt partner uses anxiety-management techniques to deal with intrusive thoughts about the affair. Target Date: 2024-05-27 Frequency: Daily Modality: individual Progress: 65%  Client Response full compliance  Service Location Location, [address] [place], [place] [address]  Service Code cpt [phone]  Self care activities  Provide education, information  Emotion regulation skills  Validate/empathize  Identify/label emotions  Facilitate problem solving  Comments  Patient agrees to a video (Caregility) session and understands the limitations of this platform. Patient is at home and provider in his office.   Dx.: Depression  Meds: Xanax . Tried antidep. meds, but she has significant side-effects. Goals: They are seeking counseling to resolve significant marital conflict secondary to infidelity. [name] has experienced symptoms of both anxiety and depression during this period and is hoping to reduce these symptoms as the relationship issues improve. [name] is experiencing some depressive symptoms as well, along with considerable anger and frustration. Goal date 12-25   [name] and [name]: He wrote a message to daughter's boyfriend but has not yet sent it. [name] fears that it will come off wrong and further alienate their daughter. [name] and [name] are doing better with each other and not arguing. He leaves for [place]  on business and she will remain here.                                  CONI ALM KERNS, Ph.D.  Time  4:15p-5:00p 45 minutes

## 2024-02-23 ENCOUNTER — Ambulatory Visit (INDEPENDENT_AMBULATORY_CARE_PROVIDER_SITE_OTHER): Admitting: Psychology

## 2024-02-23 DIAGNOSIS — F339 Major depressive disorder, recurrent, unspecified: Secondary | ICD-10-CM

## 2024-02-23 DIAGNOSIS — F4322 Adjustment disorder with anxiety: Secondary | ICD-10-CM | POA: Diagnosis not present

## 2024-02-23 DIAGNOSIS — F332 Major depressive disorder, recurrent severe without psychotic features: Secondary | ICD-10-CM | POA: Diagnosis not present

## 2024-02-23 NOTE — Progress Notes (Signed)
 Date: 02/23/2024  Treatment Plan Diagnosis 296.33 (Major depressive affective disorder, recurrent episode, severe, without mention of psychotic behavior) [n/a]  F43.22 (Adjustment Disorder, With anxiety) [n/a]  Symptoms One partner engages in sexual behavior (e.g., penile-vaginal intercourse, oral sex, anal sex) that violates the explicit or implicit expectations of the relationship. (Status: maintained) -- No Description Entered  Medication Status compliance  Safety none  If Suicidal or Homicidal State Action Taken: unspecified  Current Risk: low Medications unspecified Objectives Related Problem: Partners verbally express empathy toward each other and demonstrate shared responsibility for reconstructing the relationship. Description: Clarify what level of trust exists for various aspects of the relationship. Target Date: 2024-05-27 Frequency: Daily Modality: individual Progress: 80%  Related Problem: Partners verbally express empathy toward each other and demonstrate shared responsibility for reconstructing the relationship. Description: List relationship issues that contribute to dissatisfaction and unhappiness and agree to address these in future conjoint sessions. Target Date: 2024-05-27 Frequency: Daily Modality: individual Progress: 75%  Related Problem: Partners verbally express empathy toward each other and demonstrate shared responsibility for reconstructing the relationship. Description: Identify and list behavioral changes for self and partner that would enhance the relationship. Target Date: 2024-05-27 Frequency: Daily Modality: individual Progress: 85%  Related Problem: Partners verbally express empathy toward each other and demonstrate shared responsibility for reconstructing the relationship. Description: Describe pre-affair relationship functioning. Target Date: 2024-05-27 Frequency: Daily Modality:  individual Progress: 90%  Related Problem: Partners verbally express empathy toward each other and demonstrate shared responsibility for reconstructing the relationship. Description: Ask the partners to generate factors that they believe contributed to the affair. Target Date: 2024-05-27 Frequency: Daily Modality: individual Progress: 90%   Problem: Partners verbally express empathy toward each other and demonstrate shared responsibility for reconstructing the relationship. Description: Hurt partner uses anxiety-management techniques to deal with intrusive thoughts about the affair. Target Date: 2024-05-27 Frequency: Daily Modality: individual Progress: 65%  Client Response full compliance  Service Location Location, [address] [place], [place] [address]  Service Code cpt [phone]  Self care activities  Provide education, information  Emotion regulation skills  Validate/empathize  Identify/label emotions  Facilitate problem solving  Comments  Patient agrees to a video (Caregility) session and understands the limitations of this platform. Patient is at home and provider in his office.   Dx.: Depression  Meds: Xanax . Tried antidep. meds, but she has significant side-effects. Goals: They are seeking counseling to resolve significant marital conflict secondary to infidelity. [name] has experienced symptoms of both anxiety and depression during this period and is hoping to reduce these symptoms as the relationship issues improve. [name] is experiencing some depressive symptoms as well, along with considerable anger and frustration. Goal date 12-25   [name] and [name]: They say they had only 1 argument for this past week. He feels that [place] doesn't ever accept any responsibility for triggering an argument. We dissected why they argued and how they may have communicated differently that could have avoided the disconnect. Talked about [name]'s difficult relationship with daughter.  Helped her script how shw will approach her daughter to better understand their relationship and hopefully initiate productive dialogue.  CONI ALM KERNS, Ph.D.  Time 5:15p-6:00p 45 minutes

## 2024-02-29 ENCOUNTER — Ambulatory Visit: Admitting: Psychology

## 2024-02-29 DIAGNOSIS — F332 Major depressive disorder, recurrent severe without psychotic features: Secondary | ICD-10-CM

## 2024-02-29 DIAGNOSIS — F4322 Adjustment disorder with anxiety: Secondary | ICD-10-CM

## 2024-02-29 DIAGNOSIS — F339 Major depressive disorder, recurrent, unspecified: Secondary | ICD-10-CM

## 2024-02-29 NOTE — Progress Notes (Signed)
 Date: 02/29/2024  Treatment Plan Diagnosis 296.33 (Major depressive affective disorder, recurrent episode, severe, without mention of psychotic behavior) [n/a]  F43.22 (Adjustment Disorder, With anxiety) [n/a]  Symptoms One partner engages in sexual behavior (e.g., penile-vaginal intercourse, oral sex, anal sex) that violates the explicit or implicit expectations of the relationship. (Status: maintained) -- No Description Entered  Medication Status compliance  Safety none  If Suicidal or Homicidal State Action Taken: unspecified  Current Risk: low Medications unspecified Objectives Related Problem: Partners verbally express empathy toward each other and demonstrate shared responsibility for reconstructing the relationship. Description: Clarify what level of trust exists for various aspects of the relationship. Target Date: 2024-05-27 Frequency: Daily Modality: individual Progress: 80%  Related Problem: Partners verbally express empathy toward each other and demonstrate shared responsibility for reconstructing the relationship. Description: List relationship issues that contribute to dissatisfaction and unhappiness and agree to address these in future conjoint sessions. Target Date: 2024-05-27 Frequency: Daily Modality: individual Progress: 75%  Related Problem: Partners verbally express empathy toward each other and demonstrate shared responsibility for reconstructing the relationship. Description: Identify and list behavioral changes for self and partner that would enhance the relationship. Target Date: 2024-05-27 Frequency: Daily Modality: individual Progress: 85%  Related Problem: Partners verbally express empathy toward each other and demonstrate shared responsibility for reconstructing the relationship. Description: Describe pre-affair relationship functioning. Target Date: 2024-05-27 Frequency: Daily Modality:  individual Progress: 90%  Related Problem: Partners verbally express empathy toward each other and demonstrate shared responsibility for reconstructing the relationship. Description: Ask the partners to generate factors that they believe contributed to the affair. Target Date: 2024-05-27 Frequency: Daily Modality: individual Progress: 90%   Problem: Partners verbally express empathy toward each other and demonstrate shared responsibility for reconstructing the relationship. Description: Hurt partner uses anxiety-management techniques to deal with intrusive thoughts about the affair. Target Date: 2024-05-27 Frequency: Daily Modality: individual Progress: 65%  Client Response full compliance  Service Location Location, [address] [place], [place] [address]  Service Code cpt [phone]  Self care activities  Provide education, information  Emotion regulation skills  Validate/empathize  Identify/label emotions  Facilitate problem solving  Comments  Patient agrees to a video (Caregility) session and understands the limitations of this platform. Patient is at home and provider in his office.   Dx.: Depression  Meds: Xanax . Tried antidep. meds, but she has significant side-effects. Goals: They are seeking counseling to resolve significant marital conflict secondary to infidelity. Cena has experienced symptoms of both anxiety and depression during this period and is hoping to reduce these symptoms as the relationship issues improve. Marolyn is experiencing some depressive symptoms as well, along with considerable anger and frustration. Goal date 12-25   [name] and [name]:  Marolyn has been out of town with work so they have not had much contact. [name] reports getting more depressed when at home, especially when home. She is upset that Marolyn is not calling her in the evenings. She feels that Marolyn is exhibiting odd behavior and has no interest in her. She is convinced that Marolyn does not  care because he shows no interest in how she is doing. Marolyn will call remainder of week, but Cena expresses not feeling it is in any way sincere. He does not have explanation of why he feels that calls will end up in  conflict.                                       CONI ALM KERNS, Ph.D.  Time 4:15p-5:00p 45 minutes                                                       CONI ALM KERNS, PhD

## 2024-03-01 DIAGNOSIS — Z113 Encounter for screening for infections with a predominantly sexual mode of transmission: Secondary | ICD-10-CM | POA: Diagnosis not present

## 2024-03-01 DIAGNOSIS — Z6835 Body mass index (BMI) 35.0-35.9, adult: Secondary | ICD-10-CM | POA: Diagnosis not present

## 2024-03-01 DIAGNOSIS — Z1329 Encounter for screening for other suspected endocrine disorder: Secondary | ICD-10-CM | POA: Diagnosis not present

## 2024-03-01 DIAGNOSIS — Z131 Encounter for screening for diabetes mellitus: Secondary | ICD-10-CM | POA: Diagnosis not present

## 2024-03-01 DIAGNOSIS — N760 Acute vaginitis: Secondary | ICD-10-CM | POA: Diagnosis not present

## 2024-03-01 DIAGNOSIS — Z1321 Encounter for screening for nutritional disorder: Secondary | ICD-10-CM | POA: Diagnosis not present

## 2024-03-01 DIAGNOSIS — Z01419 Encounter for gynecological examination (general) (routine) without abnormal findings: Secondary | ICD-10-CM | POA: Diagnosis not present

## 2024-03-01 DIAGNOSIS — Z13220 Encounter for screening for lipoid disorders: Secondary | ICD-10-CM | POA: Diagnosis not present

## 2024-03-08 ENCOUNTER — Encounter: Payer: Self-pay | Admitting: Family Medicine

## 2024-03-08 ENCOUNTER — Other Ambulatory Visit: Payer: Self-pay

## 2024-03-08 ENCOUNTER — Ambulatory Visit (INDEPENDENT_AMBULATORY_CARE_PROVIDER_SITE_OTHER): Admitting: Psychology

## 2024-03-08 DIAGNOSIS — F332 Major depressive disorder, recurrent severe without psychotic features: Secondary | ICD-10-CM

## 2024-03-08 DIAGNOSIS — F339 Major depressive disorder, recurrent, unspecified: Secondary | ICD-10-CM

## 2024-03-08 DIAGNOSIS — F4322 Adjustment disorder with anxiety: Secondary | ICD-10-CM

## 2024-03-08 NOTE — Progress Notes (Signed)
 Date: 03/08/2024  Treatment Plan Diagnosis 296.33 (Major depressive affective disorder, recurrent episode, severe, without mention of psychotic behavior) [n/a]  F43.22 (Adjustment Disorder, With anxiety) [n/a]  Symptoms One partner engages in sexual behavior (e.g., penile-vaginal intercourse, oral sex, anal sex) that violates the explicit or implicit expectations of the relationship. (Status: maintained) -- No Description Entered  Medication Status compliance  Safety none  If Suicidal or Homicidal State Action Taken: unspecified  Current Risk: low Medications unspecified Objectives Related Problem: Partners verbally express empathy toward each other and demonstrate shared responsibility for reconstructing the relationship. Description: Clarify what level of trust exists for various aspects of the relationship. Target Date: 2024-05-27 Frequency: Daily Modality: individual Progress: 80%  Related Problem: Partners verbally express empathy toward each other and demonstrate shared responsibility for reconstructing the relationship. Description: List relationship issues that contribute to dissatisfaction and unhappiness and agree to address these in future conjoint sessions. Target Date: 2024-05-27 Frequency: Daily Modality: individual Progress: 75%  Related Problem: Partners verbally express empathy toward each other and demonstrate shared responsibility for reconstructing the relationship. Description: Identify and list behavioral changes for self and partner that would enhance the relationship. Target Date: 2024-05-27 Frequency: Daily Modality: individual Progress: 85%  Related Problem: Partners verbally express empathy toward each other and demonstrate shared responsibility for reconstructing the relationship. Description: Describe pre-affair relationship functioning. Target Date:  2024-05-27 Frequency: Daily Modality: individual Progress: 90%  Related Problem: Partners verbally express empathy toward each other and demonstrate shared responsibility for reconstructing the relationship. Description: Ask the partners to generate factors that they believe contributed to the affair. Target Date: 2024-05-27 Frequency: Daily Modality: individual Progress: 90%   Problem: Partners verbally express empathy toward each other and demonstrate shared responsibility for reconstructing the relationship. Description: Hurt partner uses anxiety-management techniques to deal with intrusive thoughts about the affair. Target Date: 2024-05-27 Frequency: Daily Modality: individual Progress: 65%  Client Response full compliance  Service Location Location, [address] [place], [place] [address]  Service Code cpt [phone]  Self care activities  Provide education, information  Emotion regulation skills  Validate/empathize  Identify/label emotions  Facilitate problem solving  Comments  Patient agrees to a video (Caregility) session and understands the limitations of this platform. Patient is at home and provider in his office.   Dx.: Depression  Meds: Xanax . Tried antidep. meds, but she has significant side-effects. Goals: They are seeking counseling to resolve significant marital conflict secondary to infidelity. Cena has experienced symptoms of both anxiety and depression during this period and is hoping to reduce these symptoms as the relationship issues improve. Marolyn is experiencing some depressive symptoms as well, along with considerable anger and frustration. Goal date 12-25   [name] and [name]:  Marolyn is back in [place], but has to go back to [place] . They will go on a cruise at the end of the month. Talked about a disagreement, which was recorded. Encouraged them to listen to the tape and analyze. Things with daughter have gotten worse. [name] tried having a nice conversation,  but her daughter was hateful and disrespectful. We talked about what ways may be best to respond.  CONI ALM KERNS, Ph.D.  Time 5:10-6:00p 50 minutes

## 2024-03-09 ENCOUNTER — Other Ambulatory Visit: Payer: Self-pay | Admitting: Family Medicine

## 2024-03-09 NOTE — Telephone Encounter (Signed)
 Copied from CRM [id number]. Topic: Clinical - Medication Question >> Mar 09, 2024  3:07 PM Tysheama G wrote: Reason for CRM: Patient is calling again regarding will run out of her oxyCODONE  (ROXICODONE ) 15 MG today , and her appointment isnt til Monday so she wanted to know if Dr.Fry could fill her medication to hold her over till her appointment. Callback number [phone]

## 2024-03-09 NOTE — Telephone Encounter (Signed)
 FYI Pt has appointment scheduled for 03/13/24

## 2024-03-10 ENCOUNTER — Other Ambulatory Visit: Payer: Self-pay

## 2024-03-10 ENCOUNTER — Other Ambulatory Visit (HOSPITAL_BASED_OUTPATIENT_CLINIC_OR_DEPARTMENT_OTHER): Payer: Self-pay

## 2024-03-10 ENCOUNTER — Encounter: Payer: Self-pay | Admitting: Family Medicine

## 2024-03-10 ENCOUNTER — Encounter (HOSPITAL_COMMUNITY): Payer: Self-pay

## 2024-03-10 ENCOUNTER — Other Ambulatory Visit (HOSPITAL_COMMUNITY): Payer: Self-pay

## 2024-03-10 MED ORDER — OXYCODONE HCL 15 MG PO TABS
15.0000 mg | ORAL_TABLET | ORAL | 0 refills | Status: DC | PRN
  Filled 2024-03-10 (×2): qty 30, 5d supply, fill #0

## 2024-03-10 MED ORDER — HYDROMORPHONE HCL 8 MG PO TABS
8.0000 mg | ORAL_TABLET | Freq: Every day | ORAL | 0 refills | Status: DC
  Filled 2024-03-10: qty 5, 5d supply, fill #0
  Filled ????-??-??: fill #0

## 2024-03-10 NOTE — Telephone Encounter (Signed)
 I sent in 5 day supplies for both meds

## 2024-03-10 NOTE — Telephone Encounter (Signed)
 Pt has appointment for PMV on 03/13/24, pt needs a few to get her through to her appointment on Monday. Please advise

## 2024-03-13 ENCOUNTER — Other Ambulatory Visit (HOSPITAL_COMMUNITY): Payer: Self-pay

## 2024-03-13 ENCOUNTER — Encounter: Payer: Self-pay | Admitting: Family Medicine

## 2024-03-13 ENCOUNTER — Other Ambulatory Visit: Payer: Self-pay

## 2024-03-13 ENCOUNTER — Telehealth: Admitting: Family Medicine

## 2024-03-13 DIAGNOSIS — M48061 Spinal stenosis, lumbar region without neurogenic claudication: Secondary | ICD-10-CM

## 2024-03-13 DIAGNOSIS — G8929 Other chronic pain: Secondary | ICD-10-CM | POA: Diagnosis not present

## 2024-03-13 MED ORDER — OXYCODONE HCL 15 MG PO TABS
15.0000 mg | ORAL_TABLET | ORAL | 0 refills | Status: DC | PRN
  Filled 2024-03-13 – 2024-05-13 (×3): qty 180, 30d supply, fill #0

## 2024-03-13 MED ORDER — OXYCODONE HCL 15 MG PO TABS
15.0000 mg | ORAL_TABLET | ORAL | 0 refills | Status: DC | PRN
  Filled 2024-03-13 – 2024-04-13 (×2): qty 180, 30d supply, fill #0

## 2024-03-13 MED ORDER — HYDROMORPHONE HCL 8 MG PO TABS
8.0000 mg | ORAL_TABLET | Freq: Every day | ORAL | 0 refills | Status: DC
  Filled 2024-03-13 – 2024-03-15 (×2): qty 30, 30d supply, fill #0

## 2024-03-13 MED ORDER — HYDROMORPHONE HCL 8 MG PO TABS
8.0000 mg | ORAL_TABLET | Freq: Every day | ORAL | 0 refills | Status: DC
  Filled 2024-03-13 – 2024-04-23 (×2): qty 30, 30d supply, fill #0

## 2024-03-13 MED ORDER — AMPHETAMINE-DEXTROAMPHET ER 30 MG PO CP24
30.0000 mg | ORAL_CAPSULE | Freq: Every day | ORAL | 0 refills | Status: DC
  Filled 2024-03-13 – 2024-05-27 (×2): qty 30, 30d supply, fill #0

## 2024-03-13 MED ORDER — OXYCODONE HCL 15 MG PO TABS
15.0000 mg | ORAL_TABLET | ORAL | 0 refills | Status: DC | PRN
  Filled 2024-03-13 – 2024-03-14 (×2): qty 180, 30d supply, fill #0

## 2024-03-13 MED ORDER — AMPHETAMINE-DEXTROAMPHET ER 30 MG PO CP24
30.0000 mg | ORAL_CAPSULE | Freq: Every day | ORAL | 0 refills | Status: DC
  Filled 2024-03-13 – 2024-04-23 (×2): qty 30, 30d supply, fill #0

## 2024-03-13 MED ORDER — AMPHETAMINE-DEXTROAMPHET ER 30 MG PO CP24
30.0000 mg | ORAL_CAPSULE | Freq: Every day | ORAL | 0 refills | Status: DC
  Filled 2024-03-13 – 2024-03-20 (×2): qty 30, 30d supply, fill #0

## 2024-03-13 NOTE — Telephone Encounter (Signed)
 Pt.notified

## 2024-03-13 NOTE — Progress Notes (Signed)
 Subjective:    Patient ID: [name], female    DOB: [birth year], 48 y.o.   MRN: [id number]  HPI Virtual Visit via Video Note  I connected with the patient on 03/13/24 at  3:30 PM EDT by a video enabled telemedicine application and verified that I am speaking with the correct person using two identifiers.  Location patient: home Location provider:work or home office Persons participating in the virtual visit: patient, provider  I discussed the limitations of evaluation and management by telemedicine and the availability of in person appointments. The patient expressed understanding and agreed to proceed.   HPI: Here for pain management. She is struggling with her back pain, and she is considering going back to the [facility] to consider more injections. She has also been having cramps in both legs, especially at night.    ROS: See pertinent positives and negatives per HPI.  Past Medical History:  Diagnosis Date   Allergy    Anxiety    Arthritis    B12 deficiency    Chronic narcotic use    Depression    not currently    Dysrhythmia    hx tachy-was from medication nortriptylline   Esophagitis    rx   Fibromyalgia    GERD (gastroesophageal reflux disease)    History of echocardiogram    Echo 4/18: Mild concentric LVH, vigorous LVF, EF 65-70, normal wall motion, grade 1 diastolic dysfunction   History of kidney stones    Hyperlipidemia    Intervertebral disc protrusion 01/11/2014   Migraine    Obesity (BMI 35.0-39.9 without comorbidity)    Peripheral neuropathy    Polycystic ovaries    Pulmonary embolus (HCC)    Spinal stenosis, lumbar    Vitamin D  deficiency     Past Surgical History:  Procedure Laterality Date   85 HOUR PH STUDY N/A 05/27/2016   Procedure: 24 HOUR PH STUDY;  Surgeon: Elspeth Deward Naval, MD;  Location: [facility];  Service: Gastroenterology;  Laterality: N/A;   ABDOMINAL EXPOSURE N/A 05/04/2018   Procedure: ABDOMINAL EXPOSURE;   Surgeon: Oris Krystal FALCON, MD;  Location: [facility] OR;  Service: Vascular;  Laterality: N/A;   ANTERIOR LUMBAR FUSION N/A 05/04/2018   Procedure: Lumbar Five Sacral One Anterior lumbar interbody fusion;  Surgeon: Onetha Kuba, MD;  Location: [facility] OR;  Service: Neurosurgery;  Laterality: N/A;  Lumbar Five Sacral One Anterior lumbar interbody fusion   APPENDECTOMY     BACK SURGERY  2012   left laminectomy at L3-4 per Dr. Amon    bladder tack  2012   CHOLECYSTECTOMY N/A 10/03/2015   Procedure: LAPAROSCOPIC CHOLECYSTECTOMY;  Surgeon: Herlene Beverley Bureau, MD;  Location: [facility] OR;  Service: General;  Laterality: N/A;   ESOPHAGEAL MANOMETRY N/A 05/27/2016   Procedure: ESOPHAGEAL MANOMETRY (EM);  Surgeon: Elspeth Deward Naval, MD;  Location: [facility];  Service: Gastroenterology;  Laterality: N/A;   EXCISION OF SKIN TAG  10/03/2015   Procedure: EXCISION OF SKIN TAG;  Surgeon: Herlene Beverley Kinsinger, MD;  Location: [facility] OR;  Service: General;;   LUMBAR DISC SURGERY  01-31-14   per Dr. Onetha    OOPHORECTOMY     left overy   OVARIAN CYST REMOVAL     RECTOCELE REPAIR     SINUSOTOMY  12/14/06   spinal fusion  2016/10   TONSILLECTOMY     TONSILLECTOMY     VAGINAL HYSTERECTOMY     WRIST GANGLION EXCISION Left     Family History  Problem Relation Age of Onset   Fibromyalgia Mother    Diabetes Mother    Thyroid  cancer Mother    Liver disease Mother    Neuropathy Mother    Cirrhosis Mother        non-alcoholic   Pulmonary embolism Mother        both lungs   Heart failure Mother    Hypertension Father    Diabetes Father    Heart disease Father    Prostate cancer Father    Heart attack Father        x 2   Colon cancer Neg Hx    Other Neg Hx        pheochromocytoma   Colon polyps Neg Hx      Current Outpatient Medications:    albuterol  (VENTOLIN  HFA) 108 (90 Base) MCG/ACT inhaler, Inhale 2 puffs into the lungs every 6 (six) hours as needed for wheezing or shortness of breath., Disp: 8 g, Rfl: 2    ALPRAZolam  (XANAX ) 0.5 MG tablet, TAKE 1 TABLET BY MOUTH THREE TIMES A DAY AS NEEDED, Disp: 90 tablet, Rfl: 5   B-D 3CC LUER-LOK SYR 25GX1 25G X 1 3 ML MISC, USE AS DIRECTED, Disp: 9 each, Rfl: 2   cetirizine  (ZYRTEC ) 10 MG tablet, Take 1 tablet (10 mg total) by mouth daily., Disp: 90 tablet, Rfl: 3   clobetasol  (TEMOVATE ) 0.05 % external solution, clobetasol  0.05 % scalp solution, Disp: , Rfl:    cyanocobalamin  (VITAMIN B12) 1000 MCG/ML injection, Inject 1 mL (1,000 mcg total) into the muscle once a week., Disp: 30 mL, Rfl: 11   cyclobenzaprine  (FLEXERIL ) 10 MG tablet, Take 1 tablet (10 mg total) by mouth 3 (three) times daily as needed for muscle spasms., Disp: 90 tablet, Rfl: 5   esomeprazole  (NEXIUM ) 40 MG capsule, Take 1 capsule (40 mg total) by mouth daily., Disp: 90 capsule, Rfl: 3   fluconazole  (DIFLUCAN ) 150 MG tablet, TAKE 1 TABLET BY MOUTH EVERY DAY (Patient taking differently: Take 150 mg by mouth daily. Pt takes PRN), Disp: 2 tablet, Rfl: 14   Fluocinolone Acetonide Body 0.01 % OIL, fluocinolone 0.01 % scalp oil and shower cap, Disp: , Rfl:    fluticasone  (FLONASE ) 50 MCG/ACT nasal spray, Place 2 sprays into both nostrils daily., Disp: 16 g, Rfl: 6   gabapentin  (NEURONTIN ) 300 MG capsule, Take 1 capsule (300 mg total) by mouth 3 (three) times daily., Disp: 270 capsule, Rfl: 1   hydrochlorothiazide  (HYDRODIURIL ) 25 MG tablet, Take 1 tablet (25 mg total) by mouth daily., Disp: 90 tablet, Rfl: 3   lidocaine  (LIDODERM ) 5 %, Place 1 patch onto the skin daily. Remove & Discard patch within 12 hours or as directed by MD, Disp: 30 patch, Rfl: 5   Multiple Vitamins-Minerals (MULTI-VITAMIN GUMMIES PO), Take 2 tablets by mouth daily., Disp: , Rfl:    pantoprazole  (PROTONIX ) 40 MG tablet, Take 1 tablet (40 mg total) by mouth 2 (two) times daily before a meal., Disp: 180 tablet, Rfl: 3   phenazopyridine  (PYRIDIUM ) 100 MG tablet, Take 1 tablet (100 mg total) by mouth 3 (three) times daily as needed  for pain., Disp: 10 tablet, Rfl: 0   potassium chloride  SA (KLOR-CON  M) 20 MEQ tablet, Take 2 tablets (40 mEq total) by mouth daily for 7 days., Disp: 90 tablet, Rfl: 3   promethazine  (PHENERGAN ) 25 MG tablet, Take 1 tablet (25 mg total) by mouth every 4 (four) hours as needed for nausea or vomiting., Disp: 90 tablet,  Rfl: 5   Semaglutide -Weight Management (WEGOVY ) 2.4 MG/0.75ML SOAJ, Inject 2.4 mg into the skin once a week., Disp: 9 mL, Rfl: 3   Vitamin D , Ergocalciferol , (DRISDOL ) 1.25 MG (50000 UNIT) CAPS capsule, Take 1 capsule by mouth every 7 days., Disp: 12 capsule, Rfl: 3   amphetamine -dextroamphetamine  (ADDERALL  XR) 30 MG 24 hr capsule, Take 1 capsule (30 mg total) by mouth daily., Disp: 30 capsule, Rfl: 0   amphetamine -dextroamphetamine  (ADDERALL  XR) 30 MG 24 hr capsule, Take 1 capsule (30 mg total) by mouth daily., Disp: 30 capsule, Rfl: 0   amphetamine -dextroamphetamine  (ADDERALL  XR) 30 MG 24 hr capsule, Take 1 capsule (30 mg total) by mouth daily., Disp: 30 capsule, Rfl: 0   HYDROmorphone  (DILAUDID ) 8 MG tablet, Take 1 tablet (8 mg) by mouth at bedtime., Disp: 30 tablet, Rfl: 0   HYDROmorphone  (DILAUDID ) 8 MG tablet, Take 1 tablet (8 mg) by mouth at bedtime., Disp: 30 tablet, Rfl: 0   HYDROmorphone  (DILAUDID ) 8 MG tablet, Take 1 tablet (8 mg) by mouth at bedtime., Disp: 30 tablet, Rfl: 0   oxyCODONE  (ROXICODONE ) 15 MG immediate release tablet, Take 1 tablet (15 mg total) by mouth every 4 (four) hours as needed for pain., Disp: 180 tablet, Rfl: 0   oxyCODONE  (ROXICODONE ) 15 MG immediate release tablet, Take 1 tablet (15 mg total) by mouth every 4 (four) hours as needed for pain., Disp: 180 tablet, Rfl: 0   oxyCODONE  (ROXICODONE ) 15 MG immediate release tablet, Take 1 tablet (15 mg total) by mouth every 4 (four) hours as needed for pain., Disp: 180 tablet, Rfl: 0  EXAM:  VITALS per patient if applicable:  GENERAL: alert, oriented, appears well and in no acute distress  HEENT:  atraumatic, conjunttiva clear, no obvious abnormalities on inspection of external nose and ears  NECK: normal movements of the head and neck  LUNGS: on inspection no signs of respiratory distress, breathing rate appears normal, no obvious gross SOB, gasping or wheezing  CV: no obvious cyanosis  MS: moves all visible extremities without noticeable abnormality  PSYCH/NEURO: pleasant and cooperative, no obvious depression or anxiety, speech and thought processing grossly intact  ASSESSMENT AND PLAN: Pain management. Indication for chronic opioid: low back pain Medication and dose: Oxycodone  15 mg and Hydromorphone  8 mg # pills per month: 180 and 30 Last UDS date: 08-27-23 Opioid Treatment Agreement signed (Y/N): 04-17-19 Opioid Treatment Agreement last reviewed with patient:  03-13-24 [facility] reviewed this encounter (include red flags): Yes Meds were refilled. For the leg cramps, I suggested she take 800 to 1200 mg of magnesium  at bedtime.  Garnette Olmsted, MD  Discussed the following assessment and plan:  No diagnosis found.     I discussed the assessment and treatment plan with the patient. The patient was provided an opportunity to ask questions and all were answered. The patient agreed with the plan and demonstrated an understanding of the instructions.   The patient was advised to call back or seek an in-person evaluation if the symptoms worsen or if the condition fails to improve as anticipated.      Review of Systems     Objective:   Physical Exam        Assessment & Plan:

## 2024-03-13 NOTE — Telephone Encounter (Signed)
 Done

## 2024-03-14 ENCOUNTER — Other Ambulatory Visit (HOSPITAL_COMMUNITY): Payer: Self-pay

## 2024-03-14 ENCOUNTER — Ambulatory Visit: Admitting: Psychology

## 2024-03-14 ENCOUNTER — Other Ambulatory Visit: Payer: Self-pay

## 2024-03-14 DIAGNOSIS — F339 Major depressive disorder, recurrent, unspecified: Secondary | ICD-10-CM

## 2024-03-14 DIAGNOSIS — F4322 Adjustment disorder with anxiety: Secondary | ICD-10-CM

## 2024-03-14 DIAGNOSIS — F332 Major depressive disorder, recurrent severe without psychotic features: Secondary | ICD-10-CM | POA: Diagnosis not present

## 2024-03-14 NOTE — Progress Notes (Signed)
 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 (Status: maintained) -- No Description Entered  Medication Status compliance  Safety none  If Suicidal or Homicidal State Action Taken: unspecified  Current Risk: low Medications unspecified Objectives Related Problem: Partners verbally express empathy toward each other and demonstrate shared responsibility for reconstructing the relationship. Description: Clarify what level of trust exists for various aspects of the relationship. Target Date: 2024-05-27 Frequency: Daily Modality: individual Progress: 80%  Related Problem: Partners verbally express empathy toward each other and demonstrate shared responsibility for reconstructing the relationship. Description: List relationship issues that contribute to dissatisfaction and unhappiness and agree to address these in future conjoint sessions. Target Date: 2024-05-27 Frequency: Daily Modality: individual Progress: 75%  Related Problem: Partners verbally express empathy toward each other and demonstrate shared responsibility for reconstructing the relationship. Description: Identify and list behavioral changes for self and partner that would enhance the relationship. Target Date: 2024-05-27 Frequency: Daily Modality: individual Progress: 85%  Related Problem: Partners verbally express empathy toward each other and demonstrate shared responsibility for reconstructing the relationship. Description: Describe pre-affair relationship  functioning. Target Date: 2024-05-27 Frequency: Daily Modality: individual Progress: 90%  Related Problem: Partners verbally express empathy toward each other and demonstrate shared responsibility for reconstructing the relationship. Description: Ask the partners to generate factors that they believe contributed to the affair. Target Date: 2024-05-27 Frequency: Daily Modality: individual Progress: 90%   Problem: Partners verbally express empathy toward each other and demonstrate shared responsibility for reconstructing the relationship. Description: Hurt partner uses anxiety-management techniques to deal with intrusive thoughts about the affair. Target Date: 2024-05-27 Frequency: Daily Modality: individual Progress: 65%  Client Response full compliance  Service Location Location, [address] [place], [place] [address]  Service Code cpt [phone]  Self care activities  Provide education, information  Emotion regulation skills  Validate/empathize  Identify/label emotions  Facilitate problem solving  Comments  Patient agrees to a video (Caregility) session and understands the limitations of this platform. Patient is at home and provider in his office.   Dx.: Depression  Meds: Xanax . Tried antidep. meds, but she has significant side-effects. Goals: They are seeking counseling to resolve significant marital conflict secondary to infidelity. [name] has experienced symptoms of both anxiety and depression during this period and is hoping to reduce these symptoms as the relationship issues improve. [name] is experiencing some depressive symptoms as well, along with considerable anger and frustration. Goal date 12-25   [name] and [name]:  She talked about the fact that they each have depression and neither have been successfully treated. 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 TMS didn't work. [name] simply is non-compliant with  medication. Discussed alternatives, of which there are few.  CONI ALM KERNS, Ph.D.  Time 4:10-5:00p 50 minutes

## 2024-03-15 ENCOUNTER — Other Ambulatory Visit (HOSPITAL_COMMUNITY): Payer: Self-pay

## 2024-03-20 ENCOUNTER — Other Ambulatory Visit: Payer: Self-pay | Admitting: Family Medicine

## 2024-03-20 ENCOUNTER — Other Ambulatory Visit: Payer: Self-pay

## 2024-03-20 ENCOUNTER — Other Ambulatory Visit (HOSPITAL_COMMUNITY): Payer: Self-pay

## 2024-03-21 ENCOUNTER — Other Ambulatory Visit: Payer: Self-pay

## 2024-03-21 ENCOUNTER — Other Ambulatory Visit (HOSPITAL_COMMUNITY): Payer: Self-pay

## 2024-03-21 MED ORDER — VITAMIN D (ERGOCALCIFEROL) 1.25 MG (50000 UNIT) PO CAPS
50000.0000 [IU] | ORAL_CAPSULE | ORAL | 3 refills | Status: AC
  Filled 2024-03-21: qty 4, 28d supply, fill #0

## 2024-03-22 ENCOUNTER — Ambulatory Visit: Admitting: Psychology

## 2024-03-22 DIAGNOSIS — F332 Major depressive disorder, recurrent severe without psychotic features: Secondary | ICD-10-CM

## 2024-03-22 DIAGNOSIS — F339 Major depressive disorder, recurrent, unspecified: Secondary | ICD-10-CM

## 2024-03-22 DIAGNOSIS — F4322 Adjustment disorder with anxiety: Secondary | ICD-10-CM

## 2024-03-22 NOTE — Progress Notes (Signed)
 Date: 03/22/2024  Treatment Plan Diagnosis 296.33 (Major depressive affective disorder, recurrent episode, severe, without mention of psychotic behavior) [n/a]  F43.22 (Adjustment Disorder, With anxiety) [n/a]  Symptoms One partner engages in sexual behavior (e.g., penile-vaginal intercourse, oral sex, anal sex) that violates the explicit or implicit expectations of the relationship. (Status: maintained) -- No Description Entered  Medication Status compliance  Safety none  If Suicidal or Homicidal State Action Taken: unspecified  Current Risk: low Medications unspecified Objectives Related Problem: Partners verbally express empathy toward each other and demonstrate shared responsibility for reconstructing the relationship. Description: Clarify what level of trust exists for various aspects of the relationship. Target Date: 2024-05-27 Frequency: Daily Modality: individual Progress: 80%  Related Problem: Partners verbally express empathy toward each other and demonstrate shared responsibility for reconstructing the relationship. Description: List relationship issues that contribute to dissatisfaction and unhappiness and agree to address these in future conjoint sessions. Target Date: 2024-05-27 Frequency: Daily Modality: individual Progress: 75%  Related Problem: Partners verbally express empathy toward each other and demonstrate shared responsibility for reconstructing the relationship. Description: Identify and list behavioral changes for self and partner that would enhance the relationship. Target Date: 2024-05-27 Frequency: Daily Modality: individual Progress: 85%  Related Problem: Partners verbally express empathy toward each other and demonstrate shared responsibility for reconstructing the relationship. Description: Describe  pre-affair relationship functioning. Target Date: 2024-05-27 Frequency: Daily Modality: individual Progress: 90%  Related Problem: Partners verbally express empathy toward each other and demonstrate shared responsibility for reconstructing the relationship. Description: Ask the partners to generate factors that they believe contributed to the affair. Target Date: 2024-05-27 Frequency: Daily Modality: individual Progress: 90%   Problem: Partners verbally express empathy toward each other and demonstrate shared responsibility for reconstructing the relationship. Description: Hurt partner uses anxiety-management techniques to deal with intrusive thoughts about the affair. Target Date: 2024-05-27 Frequency: Daily Modality: individual Progress: 65%  Client Response full compliance  Service Location Location, [address] [place], [place] [address]  Service Code cpt [id number]  Self care activities  Provide education, information  Emotion regulation skills  Validate/empathize  Identify/label emotions  Facilitate problem solving  Comments  Patient agrees to a video (Caregility) session and understands the limitations of this platform. Patient is at home and provider in his office.   Dx.: Depression  Meds: Xanax . Tried antidep. meds, but she has significant side-effects. Goals: They are seeking counseling to resolve significant marital conflict secondary to infidelity. [name] has experienced symptoms of both anxiety and depression during this period and is hoping to reduce these symptoms as the relationship issues improve. [name] is experiencing some depressive symptoms as well, along with considerable anger and frustration. Goal date 12-25   [name] and [name]:  She talked about [name] being out of town and it was difficult when he wasn't calling or staying in contact. He has gotten much better about contact. She is worried about what to do with the upcoming holidays because of strained  family relations. It does not appear that it would be a good experience given current state  of the relationships.                                             CONI ALM KERNS, Ph.D.  Time 5:10-6:00p 50 minutes

## 2024-03-28 ENCOUNTER — Ambulatory Visit: Admitting: Psychology

## 2024-04-14 ENCOUNTER — Other Ambulatory Visit (HOSPITAL_COMMUNITY): Payer: Self-pay

## 2024-04-14 ENCOUNTER — Other Ambulatory Visit: Payer: Self-pay

## 2024-04-14 ENCOUNTER — Other Ambulatory Visit: Payer: Self-pay | Admitting: Family Medicine

## 2024-04-14 ENCOUNTER — Ambulatory Visit: Payer: Self-pay

## 2024-04-14 NOTE — Telephone Encounter (Addendum)
 FYI Only or Action Required?: FYI only for provider: Advised pt obtain limited refill of oxycodone  at [facility] over the weekend.  Patient was last seen in primary care on 03/13/2024 by Johnny Garnette LABOR, MD.  Called Nurse Triage reporting Medication Refill.  Symptoms began No symptoms at this time besides chronic pain.  Interventions attempted: Prescription medications: Oxycodone  15 mg IR.  Symptoms are: stable.  Triage Disposition: See Physician Within 24 Hours (overriding Call PCP When Office is Open)  Patient/caregiver understands and will follow disposition?: Yes  Reason for Disposition  Caller requesting a CONTROLLED substance prescription refill (e.g., narcotics, ADHD medicines)  Answer Assessment - Initial Assessment Questions Pt reports submitting refill request on MyChart last night for oxycodone  that she has been taking Q4H consistently for past few years. Says she confirmed she clicked the button on Mychart to have them sent in for pick up at [facility] which she has done many times before. Pt called today at 3:47 pm to check if rx was ready for pick up and was told then were being sent via mail order pharmacy and would arrive Monday. Pt explained that they should have been sent in for local pick up and was told it likely is a MyChart error and that this has happened with other patients. Asking if 2 day supply can be filled to get her through the weekend. Office is closed and unable to obtain limited refill from provider at this time. Advised [facility] for limited refill over the weekend, pt reports she will have to find someone to take her.   1. DRUG NAME: What medicine do you need to have refilled?     Oxycodone  15 mg  2. REFILLS REMAINING: How many refills are remaining? Notes: The label on the medicine or pill bottle will show how many refills are remaining. If there are no refills remaining, then a renewal may be needed.     0  3. EXPIRATION DATE: What is the expiration  date? Note: The label states when the prescription will expire, and thus can no longer be refilled.)     *No Answer*  4. PRESCRIBER: Who prescribed it? Note: The prescribing doctor or group is responsible for refill approvals.SABRA Johnny Garnette LABOR, MD  5. PHARMACY: Have you contacted your pharmacy (drugstore)? Note: Some pharmacies will contact the doctor (or NP/PA).      Yes, [facility]  6. SYMPTOMS: Do you have any symptoms?     Chronic pain  Protocols used: Medication Refill and Renewal Call-A-AH

## 2024-04-14 NOTE — Telephone Encounter (Signed)
 This RN attempted to contact patient, no answer, left voicemail to return call to clinic.  Will route HP to clinic.     Message from Alfonso HERO sent at 04/14/2024  5:31 PM EST  Reason for Triage: patient calling because she is going to be out of her Pending oxyCODONE  HCl 15 mg Oral Every 4 hours PRN. The Rx was sent to the mail order pharmacy instead of the regular pharmacy. She says she doesn't know what shw going to do if she runs out because she was told due to her taking it for so long it could be dangerous for her if she doesn't take it. Requesting a callback.

## 2024-04-17 ENCOUNTER — Telehealth: Payer: Self-pay | Admitting: *Deleted

## 2024-04-17 NOTE — Telephone Encounter (Signed)
 Copied from CRM #[id number]. Topic: Clinical - Medication Question >> Apr 14, 2024  4:11 PM [name] wrote: Reason for CRM: Pt's prescription for oxyCODONE  (ROXICODONE ) 15 MG immediate release tablet was supposed to be picked up from [facility] but was mailed instead. Pt said she doesn't have enough pills to last until Monday. She is requesting a weekend supply be sent to [facility]. She said the pharmacy sent over a request and note explaining the situation.

## 2024-04-17 NOTE — Telephone Encounter (Signed)
 FYI Pt Rx will be delivered today per pt pharmacy

## 2024-04-17 NOTE — Telephone Encounter (Signed)
 FYI Spoke with pt state that she took her Dilaudid  over the weekend, stated that the Oxycodone  will be delivered today. Disregard previous messages.Nothing further needed

## 2024-04-18 ENCOUNTER — Other Ambulatory Visit: Payer: Self-pay

## 2024-04-24 ENCOUNTER — Other Ambulatory Visit (HOSPITAL_COMMUNITY): Payer: Self-pay

## 2024-04-24 ENCOUNTER — Other Ambulatory Visit: Payer: Self-pay

## 2024-04-25 ENCOUNTER — Ambulatory Visit: Admitting: Psychology

## 2024-04-25 DIAGNOSIS — F339 Major depressive disorder, recurrent, unspecified: Secondary | ICD-10-CM | POA: Diagnosis not present

## 2024-04-25 NOTE — Progress Notes (Signed)
 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 (Status: maintained) -- No Description Entered  Medication Status compliance  Safety none  If Suicidal or Homicidal State Action Taken: unspecified  Current Risk: low Medications unspecified Objectives Related Problem: Partners verbally express empathy toward each other and demonstrate shared responsibility for reconstructing the relationship. Description: Clarify what level of trust exists for various aspects of the relationship. Target Date: 2024-05-27 Frequency: Daily Modality: individual Progress: 80%  Related Problem: Partners verbally express empathy toward each other and demonstrate shared responsibility for reconstructing the relationship. Description: List relationship issues that contribute to dissatisfaction and unhappiness and agree to address these in future conjoint sessions. Target Date: 2024-05-27 Frequency: Daily Modality: individual Progress: 75%  Related Problem: Partners verbally express empathy toward each other and demonstrate shared responsibility for reconstructing the relationship. Description: Identify and list behavioral changes for self and partner that would enhance the relationship. Target Date: 2024-05-27 Frequency: Daily Modality: individual Progress: 85%  Related Problem: Partners verbally express empathy toward each other and demonstrate shared responsibility for reconstructing the  relationship. Description: Describe pre-affair relationship functioning. Target Date: 2024-05-27 Frequency: Daily Modality: individual Progress: 90%  Related Problem: Partners verbally express empathy toward each other and demonstrate shared responsibility for reconstructing the relationship. Description: Ask the partners to generate factors that they believe contributed to the affair. Target Date: 2024-05-27 Frequency: Daily Modality: individual Progress: 90%   Problem: Partners verbally express empathy toward each other and demonstrate shared responsibility for reconstructing the relationship. Description: Hurt partner uses anxiety-management techniques to deal with intrusive thoughts about the affair. Target Date: 2024-05-27 Frequency: Daily Modality: individual Progress: 65%  Client Response full compliance  Service Location Location, [address] [place], [place] [address]  Service Code cpt [phone]  Self care activities  Provide education, information  Emotion regulation skills  Validate/empathize  Identify/label emotions  Facilitate problem solving  Comments  Patient agrees to a video (Caregility) session and understands the limitations of this platform. Patient is at home and provider in his office.   Dx.: Depression  Meds: Xanax . Tried antidep. meds, but she has significant side-effects. Goals: They are seeking counseling to resolve significant marital conflict secondary to infidelity. Cena has experienced symptoms of both anxiety and depression during this period and is hoping to reduce these symptoms as the relationship issues improve. Marolyn is experiencing some depressive symptoms as well, along with considerable anger and frustration. Goal date 12-25   [name] and Marolyn: States that their son is now living on his own and he is not paying for their child. They are not sure whether to invite their son for Thanksgiving. Their daughter has told them she will not  attend. Marolyn and [name] plan to  go to the beach. They discussed an argument they had and he feels she never accepts any responsibility for contributing to their arguments.                                                     CONI ALM KERNS, Ph.D.  Time 4:10-5:00p 50 minutes

## 2024-05-02 ENCOUNTER — Ambulatory Visit: Admitting: Psychology

## 2024-05-02 DIAGNOSIS — F339 Major depressive disorder, recurrent, unspecified: Secondary | ICD-10-CM

## 2024-05-02 NOTE — Progress Notes (Signed)
 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 (Status: maintained) -- No Description Entered  Medication Status compliance  Safety none  If Suicidal or Homicidal State Action Taken: unspecified  Current Risk: low Medications unspecified Objectives Related Problem: Partners verbally express empathy toward each other and demonstrate shared responsibility for reconstructing the relationship. Description: Clarify what level of trust exists for various aspects of the relationship. Target Date: 2025-05-27 Frequency: Daily Modality: individual Progress: 80%  Related Problem: Partners verbally express empathy toward each other and demonstrate shared responsibility for reconstructing the relationship. Description: List relationship issues that contribute to dissatisfaction and unhappiness and agree to address these in future conjoint sessions. Target Date: 2025-05-27 Frequency: Daily Modality: individual Progress: 75%  Related Problem: Partners verbally express empathy toward each other and demonstrate shared responsibility for reconstructing the relationship. Description: Identify and list behavioral changes for self and partner that would enhance the relationship. Target Date: 2025-05-27 Frequency: Daily Modality: individual Progress: 85%  Related Problem: Partners verbally express empathy toward each other and demonstrate shared responsibility for reconstructing the  relationship. Description: Describe pre-affair relationship functioning. Target Date: 2025-05-27 Frequency: Daily Modality: individual Progress: 90%  Related Problem: Partners verbally express empathy toward each other and demonstrate shared responsibility for reconstructing the relationship. Description: Ask the partners to generate factors that they believe contributed to the affair. Target Date: 2025-05-27 Frequency: Daily Modality: individual Progress: 90%   Problem: Partners verbally express empathy toward each other and demonstrate shared responsibility for reconstructing the relationship. Description: Hurt partner uses anxiety-management techniques to deal with intrusive thoughts about the affair. Target Date: 2025-05-27 Frequency: Daily Modality: individual Progress: 70%  Client Response full compliance  Service Location Location, [address] [place], [place] [address]  Service Code cpt [phone]  Self care activities  Provide education, information  Emotion regulation skills  Validate/empathize  Identify/label emotions  Facilitate problem solving  Comments  Patient agrees to a video (Caregility) session and understands the limitations of this platform. Patient is at home and provider in his office.   Dx.: Depression  Meds: Xanax . Tried antidep. meds, but she has significant side-effects. Goals: They are seeking counseling to resolve significant marital conflict secondary to infidelity. [name] has experienced symptoms of both anxiety and depression during this period and is hoping to reduce these symptoms as the relationship issues improve. [name] is experiencing some depressive symptoms as well, along with considerable anger and frustration. Goal date 12-26   [name] and [name]: Says that neither of his kids were with them for Thanksgiving. They were at the beach and she was very sad to not be with the whole family. They are uncertain about how to handle the Christmas  gathering. We discussed options. [name] is  also struggling to determine whether to attend her daughter's baby shower on Saturday. Talked about best options and ways to respond to daughter's hostility toward them.                                                  CONI ALM KERNS, Ph.D.  Time 4:10-5:00p 50 minutes                           [

## 2024-05-03 ENCOUNTER — Ambulatory Visit: Admitting: Psychology

## 2024-05-08 ENCOUNTER — Other Ambulatory Visit (HOSPITAL_COMMUNITY): Payer: Self-pay

## 2024-05-09 ENCOUNTER — Ambulatory Visit: Admitting: Psychology

## 2024-05-09 DIAGNOSIS — F339 Major depressive disorder, recurrent, unspecified: Secondary | ICD-10-CM

## 2024-05-09 DIAGNOSIS — F4322 Adjustment disorder with anxiety: Secondary | ICD-10-CM | POA: Diagnosis not present

## 2024-05-09 NOTE — Progress Notes (Signed)
 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 (Status: maintained) -- No Description Entered  Medication Status compliance  Safety none  If Suicidal or Homicidal State Action Taken: unspecified  Current Risk: low Medications unspecified Objectives Related Problem: Partners verbally express empathy toward each other and demonstrate shared responsibility for reconstructing the relationship. Description: Clarify what level of trust exists for various aspects of the relationship. Target Date: 2025-05-27 Frequency: Daily Modality: individual Progress: 80%  Related Problem: Partners verbally express empathy toward each other and demonstrate shared responsibility for reconstructing the relationship. Description: List relationship issues that contribute to dissatisfaction and unhappiness and agree to address these in future conjoint sessions. Target Date: 2025-05-27 Frequency: Daily Modality: individual Progress: 75%  Related Problem: Partners verbally express empathy toward each other and demonstrate shared responsibility for reconstructing the relationship. Description: Identify and list behavioral changes for self and partner that would enhance the relationship. Target Date: 2025-05-27 Frequency: Daily Modality: individual Progress: 85%  Related Problem: Partners verbally express empathy toward each other and demonstrate shared responsibility  for reconstructing the relationship. Description: Describe pre-affair relationship functioning. Target Date: 2025-05-27 Frequency: Daily Modality: individual Progress: 90%  Related Problem: Partners verbally express empathy toward each other and demonstrate shared responsibility for reconstructing the relationship. Description: Ask the partners to generate factors that they believe contributed to the affair. Target Date: 2025-05-27 Frequency: Daily Modality: individual Progress: 90%   Problem: Partners verbally express empathy toward each other and demonstrate shared responsibility for reconstructing the relationship. Description: Hurt partner uses anxiety-management techniques to deal with intrusive thoughts about the affair. Target Date: 2025-05-27 Frequency: Daily Modality: individual Progress: 70%  Client Response full compliance  Service Location Location, [address] [place], [place] [address]  Service Code cpt [id number]  Self care activities  Provide education, information  Emotion regulation skills  Validate/empathize  Identify/label emotions  Facilitate problem solving  Comments  Patient agrees to a video (Caregility) session and understands the limitations of this platform. Patient is at home and provider in his office.   Dx.: Depression  Meds: Xanax . Tried antidep. meds, but she has significant side-effects. Goals: They are seeking counseling to resolve significant marital conflict secondary to infidelity. [name] has experienced symptoms of both anxiety and depression during this period and is hoping to reduce these symptoms as the relationship issues improve. [name] is experiencing some depressive symptoms as well, along with considerable anger and frustration. Goal date 12-26   [name] and [name]: She decided to go to the baby shower and it was awkward. Her daughter was cool to her, but did text her afterward. This morning however, her daughter put  her off and told  her everything is not fine in their relationship. [name] is not sure what the problem is with the relationship. We discussed the strategy to take with their daughter to try to resolve their differences. [name] needs to accept that her daughter's reality differs from hers and she need not get into details and defensiveness.                                                      CONI ALM KERNS, Ph.D.  Time 4:10-5:00p 50 minutes                           [

## 2024-05-13 ENCOUNTER — Other Ambulatory Visit (HOSPITAL_BASED_OUTPATIENT_CLINIC_OR_DEPARTMENT_OTHER): Payer: Self-pay

## 2024-05-13 ENCOUNTER — Other Ambulatory Visit (HOSPITAL_COMMUNITY): Payer: Self-pay

## 2024-05-17 ENCOUNTER — Ambulatory Visit: Admitting: Psychology

## 2024-05-17 DIAGNOSIS — F339 Major depressive disorder, recurrent, unspecified: Secondary | ICD-10-CM

## 2024-05-17 NOTE — Progress Notes (Signed)
 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 (Status: maintained) -- No Description Entered  Medication Status compliance  Safety none  If Suicidal or Homicidal State Action Taken: unspecified  Current Risk: low Medications unspecified Objectives Related Problem: Partners verbally express empathy toward each other and demonstrate shared responsibility for reconstructing the relationship. Description: Clarify what level of trust exists for various aspects of the relationship. Target Date: 2025-05-27 Frequency: Daily Modality: individual Progress: 80%  Related Problem: Partners verbally express empathy toward each other and demonstrate shared responsibility for reconstructing the relationship. Description: List relationship issues that contribute to dissatisfaction and unhappiness and agree to address these in future conjoint sessions. Target Date: 2025-05-27 Frequency: Daily Modality: individual Progress: 75%  Related Problem: Partners verbally express empathy toward each other and demonstrate shared responsibility for reconstructing the relationship. Description: Identify and list behavioral changes for self and partner that would enhance the relationship. Target Date: 2025-05-27 Frequency: Daily Modality: individual Progress: 85%  Related Problem: Partners verbally express empathy toward each other and  demonstrate shared responsibility for reconstructing the relationship. Description: Describe pre-affair relationship functioning. Target Date: 2025-05-27 Frequency: Daily Modality: individual Progress: 90%  Related Problem: Partners verbally express empathy toward each other and demonstrate shared responsibility for reconstructing the relationship. Description: Ask the partners to generate factors that they believe contributed to the affair. Target Date: 2025-05-27 Frequency: Daily Modality: individual Progress: 90%   Problem: Partners verbally express empathy toward each other and demonstrate shared responsibility for reconstructing the relationship. Description: Hurt partner uses anxiety-management techniques to deal with intrusive thoughts about the affair. Target Date: 2025-05-27 Frequency: Daily Modality: individual Progress: 70%  Client Response full compliance  Service Location Location, [address] [place], [place] [address]  Service Code cpt [phone]  Self care activities  Provide education, information  Emotion regulation skills  Validate/empathize  Identify/label emotions  Facilitate problem solving  Comments  Patient agrees to a video (Caregility) session and understands the limitations of this platform. Patient is at home and provider in his office.   Dx.: Depression  Meds: Xanax . Tried antidep. meds, but she has significant side-effects. Goals: They are seeking counseling to resolve significant marital conflict secondary to infidelity. [name] has experienced symptoms of both anxiety and depression during this period and is hoping to reduce these symptoms as the relationship issues improve. [name] is experiencing some depressive symptoms as well, along with considerable anger and frustration. Goal date 12-26   [name] and [name]: [name] says that she tries to take on some work to help with finances  because it has been very tight lately. She is mostly not active and  feels very depressed. Says that [name] is also depressed lately and expresses hurt about relationship with the kids. She feels he is also upset that it is the holidays and family relations are so dysfunctional. [name] says he had an episode of anxiety in the meddle of the night and was unable to cool off. States he has never had a panic attack, so is not sure if that is what happened. Discussed relationship problems with daughter [name] and possible ways to approach them.                                                        CONI ALM KERNS, Ph.D.  Time 5:15-6:00p 50 minutes                           [

## 2024-05-18 ENCOUNTER — Ambulatory Visit (INDEPENDENT_AMBULATORY_CARE_PROVIDER_SITE_OTHER)

## 2024-05-18 ENCOUNTER — Ambulatory Visit: Admitting: Podiatry

## 2024-05-18 DIAGNOSIS — G609 Hereditary and idiopathic neuropathy, unspecified: Secondary | ICD-10-CM

## 2024-05-18 DIAGNOSIS — M7751 Other enthesopathy of right foot: Secondary | ICD-10-CM

## 2024-05-18 DIAGNOSIS — G894 Chronic pain syndrome: Secondary | ICD-10-CM | POA: Diagnosis not present

## 2024-05-18 DIAGNOSIS — M7752 Other enthesopathy of left foot: Secondary | ICD-10-CM

## 2024-05-18 NOTE — Progress Notes (Signed)
 Subjective:   Patient ID: [name], female   DOB: 48 y.o.   MRN: [id number]   HPI Patient presents stating she has had numerous back surgeries in the past she has neuropathy from all the different surgeries suffers from depression and has developed what appears to be form of chronic pain syndrome bilateral feet that has been ongoing getting worse and making light relatively intolerable.  Patient tried ketamine  which seemed to give short-term improvement and has tried gabapentin  and pain medicine   Review of Systems  All other systems reviewed and are negative.       Objective:  Physical Exam Vitals and nursing note reviewed.  Constitutional:      Appearance: She is well-developed.  Pulmonary:     Effort: Pulmonary effort is normal.  Musculoskeletal:        General: Normal range of motion.  Skin:    General: Skin is warm.  Neurological:     Mental Status: She is alert.     Neurovascular status intact muscle strength adequate range of motion within normal limits with patient found to have significant redness of both feet pictures that show periods of them getting white and other and extreme intense pain with history of neuropathy also.  Patient also has tentative diagnosis of Raynaud's phenomena     Assessment:  Appears to be a combination of neuropathy with probability for CRPS and I explained all this to the patient.     Plan:  H&P x-ray taken indicating no arthritis appears to be some form of systemic condition and discussed the possibility for Qutenza therapy and I am going to look into this and see whether or not this could be something possible for this patient.  We will contact her and beyond that I do not see anything I can currently  X-rays are negative for signs of arthritis appears to be soft tissue

## 2024-05-23 ENCOUNTER — Ambulatory Visit (INDEPENDENT_AMBULATORY_CARE_PROVIDER_SITE_OTHER): Admitting: Psychology

## 2024-05-23 DIAGNOSIS — F339 Major depressive disorder, recurrent, unspecified: Secondary | ICD-10-CM

## 2024-05-23 DIAGNOSIS — F4322 Adjustment disorder with anxiety: Secondary | ICD-10-CM

## 2024-05-23 NOTE — Progress Notes (Signed)
 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 (Status: maintained) -- No Description Entered  Medication Status compliance  Safety none  If Suicidal or Homicidal State Action Taken: unspecified  Current Risk: low Medications unspecified Objectives Related Problem: Partners verbally express empathy toward each other and demonstrate shared responsibility for reconstructing the relationship. Description: Clarify what level of trust exists for various aspects of the relationship. Target Date: 2025-05-27 Frequency: Daily Modality: individual Progress: 80%  Related Problem: Partners verbally express empathy toward each other and demonstrate shared responsibility for reconstructing the relationship. Description: List relationship issues that contribute to dissatisfaction and unhappiness and agree to address these in future conjoint sessions. Target Date: 2025-05-27 Frequency: Daily Modality: individual Progress: 75%  Related Problem: Partners verbally express empathy toward each other and demonstrate shared responsibility for reconstructing the relationship. Description: Identify and list behavioral changes for self and partner that would enhance the relationship. Target Date: 2025-05-27 Frequency: Daily Modality: individual Progress: 85%  Related Problem: Partners verbally express  empathy toward each other and demonstrate shared responsibility for reconstructing the relationship. Description: Describe pre-affair relationship functioning. Target Date: 2025-05-27 Frequency: Daily Modality: individual Progress: 90%  Related Problem: Partners verbally express empathy toward each other and demonstrate shared responsibility for reconstructing the relationship. Description: Ask the partners to generate factors that they believe contributed to the affair. Target Date: 2025-05-27 Frequency: Daily Modality: individual Progress: 90%   Problem: Partners verbally express empathy toward each other and demonstrate shared responsibility for reconstructing the relationship. Description: Hurt partner uses anxiety-management techniques to deal with intrusive thoughts about the affair. Target Date: 2025-05-27 Frequency: Daily Modality: individual Progress: 70%  Client Response full compliance  Service Location Location, [address] [place], [place] [address]  Service Code cpt [phone]  Self care activities  Provide education, information  Emotion regulation skills  Validate/empathize  Identify/label emotions  Facilitate problem solving  Comments  Patient agrees to a video (Caregility) session and understands the limitations of this platform. Patient is at home and provider in his office.   Dx.: Depression  Meds: Xanax . Tried antidep. meds, but she has significant side-effects. Goals: They are seeking counseling to resolve significant marital conflict secondary to infidelity. Cena has experienced symptoms of both anxiety and depression during this period and is hoping to reduce these symptoms as the relationship issues improve. Marolyn is experiencing some depressive symptoms as well, along with considerable anger and frustration. Goal date 12-26   [facility]  and [name]: Marolyn sent a message to his son asking him to send a message to his sister to apologize for the negative  things he had said. He did send the message to her and she did not respond to him. Simultaneously, [name] sent out a message to the kids that they are having a x-mas day dinner and the time. McKenzie wrote to ask him if her brother is invited and the other daughter said she will try to attend (youngest one) and the middle one will come with fiance'. Their son is planning to attend. Suggested they tell McKenzie that he is coming and let her make her decision. Told them not to get engaged in a back and forth with McKenzie about the decision to invite Norman.                                                           CONI ALM KERNS, Ph.D.  Time 4:15-5:00p 50 minutes                           [                            "

## 2024-05-27 ENCOUNTER — Other Ambulatory Visit (HOSPITAL_COMMUNITY): Payer: Self-pay

## 2024-05-31 DIAGNOSIS — Z1231 Encounter for screening mammogram for malignant neoplasm of breast: Secondary | ICD-10-CM | POA: Diagnosis not present

## 2024-06-02 ENCOUNTER — Encounter: Payer: Self-pay | Admitting: Podiatry

## 2024-06-05 DIAGNOSIS — F339 Major depressive disorder, recurrent, unspecified: Secondary | ICD-10-CM | POA: Diagnosis not present

## 2024-06-05 NOTE — Progress Notes (Signed)
 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 (Status: maintained) -- No Description Entered  Medication Status compliance  Safety none  If Suicidal or Homicidal State Action Taken: unspecified  Current Risk: low Medications unspecified Objectives Related Problem: Partners verbally express empathy toward each other and demonstrate shared responsibility for reconstructing the relationship. Description: Clarify what level of trust exists for various aspects of the relationship. Target Date: 2025-05-27 Frequency: Daily Modality: individual Progress: 80%  Related Problem: Partners verbally express empathy toward each other and demonstrate shared responsibility for reconstructing the relationship. Description: List relationship issues that contribute to dissatisfaction and unhappiness and agree to address these in future conjoint sessions. Target Date: 2025-05-27 Frequency: Daily Modality: individual Progress: 75%  Related Problem: Partners verbally express empathy toward each other and demonstrate shared responsibility for reconstructing the relationship. Description: Identify and list behavioral changes for self and partner that would enhance the relationship. Target Date: 2025-05-27 Frequency: Daily Modality: individual Progress: 85%  Related Problem:  Partners verbally express empathy toward each other and demonstrate shared responsibility for reconstructing the relationship. Description: Describe pre-affair relationship functioning. Target Date: 2025-05-27 Frequency: Daily Modality: individual Progress: 90%  Related Problem: Partners verbally express empathy toward each other and demonstrate shared responsibility for reconstructing the relationship. Description: Ask the partners to generate factors that they believe contributed to the affair. Target Date: 2025-05-27 Frequency: Daily Modality: individual Progress: 90%   Problem: Partners verbally express empathy toward each other and demonstrate shared responsibility for reconstructing the relationship. Description: Hurt partner uses anxiety-management techniques to deal with intrusive thoughts about the affair. Target Date: 2025-05-27 Frequency: Daily Modality: individual Progress: 70%  Client Response full compliance  Service Location Location, [address] [place], [place] [address]  Service Code cpt [phone]  Self care activities  Provide education, information  Emotion regulation skills  Validate/empathize  Identify/label emotions  Facilitate problem solving  Comments  Patient agrees to a video (Caregility) session and understands the limitations of this platform. Patient is at home and provider in his office.   Dx.: Depression  Meds: Xanax . Tried antidep. meds, but she has significant side-effects. Goals: They are seeking counseling to resolve significant marital conflict secondary to infidelity. Cena has experienced symptoms of both anxiety and depression during this period and is hoping to reduce these symptoms as the relationship issues improve. Marolyn is experiencing some depressive  symptoms as well, along with considerable anger and frustration. Goal date 12-26   Cena and [name]: They said that their daughter showed up for Christmas because her brother had the  flu and could not come. It went well with her visit. The two of them got along well throughout the holiday. Their son is now pulling back. They got information that their son is drinking excessively and not paying his obligations for his son. We discussed how to best approach the situation with him.  Yesterday, Shakyla got a text from someone asking is this Marolyn and then later got another message from someone saying hey. It was from a woman in [place]  in the same city where Marolyn was just working. The explanation from this woman and from Marolyn appears suspect and has triggered Liese insecurity and compromised (again) her trust in [place]. He claims that he panicked and that led to a lie, but says he soon corrected himself. Venicia is not sure how to deal with this information and we talked about what she needs to feel more confident.                                                               CONI ALM KERNS, Ph.D.  Time 12:15p-1:00p 45 minutes                           [                            "

## 2024-06-06 ENCOUNTER — Ambulatory Visit: Admitting: Psychology

## 2024-06-12 ENCOUNTER — Other Ambulatory Visit: Payer: Self-pay

## 2024-06-12 ENCOUNTER — Telehealth: Admitting: Family Medicine

## 2024-06-12 ENCOUNTER — Other Ambulatory Visit (HOSPITAL_COMMUNITY): Payer: Self-pay

## 2024-06-12 DIAGNOSIS — M48061 Spinal stenosis, lumbar region without neurogenic claudication: Secondary | ICD-10-CM

## 2024-06-12 DIAGNOSIS — G8929 Other chronic pain: Secondary | ICD-10-CM

## 2024-06-12 DIAGNOSIS — G90523 Complex regional pain syndrome I of lower limb, bilateral: Secondary | ICD-10-CM | POA: Diagnosis not present

## 2024-06-12 MED ORDER — HYDROMORPHONE HCL 8 MG PO TABS
8.0000 mg | ORAL_TABLET | Freq: Every day | ORAL | 0 refills | Status: AC
  Filled 2024-06-12 – 2024-06-26 (×2): qty 30, 30d supply, fill #0

## 2024-06-12 MED ORDER — HYDROMORPHONE HCL 8 MG PO TABS
8.0000 mg | ORAL_TABLET | Freq: Every day | ORAL | 0 refills | Status: AC
  Filled 2024-06-12: qty 30, 30d supply, fill #0

## 2024-06-12 MED ORDER — OXYCODONE HCL 15 MG PO TABS
15.0000 mg | ORAL_TABLET | ORAL | 0 refills | Status: AC | PRN
  Filled 2024-06-12: qty 180, 30d supply, fill #0

## 2024-06-12 MED ORDER — AMPHETAMINE-DEXTROAMPHET ER 30 MG PO CP24
30.0000 mg | ORAL_CAPSULE | Freq: Every day | ORAL | 0 refills | Status: AC
  Filled 2024-06-12: qty 30, 30d supply, fill #0

## 2024-06-12 MED ORDER — AMPHETAMINE-DEXTROAMPHET ER 30 MG PO CP24
30.0000 mg | ORAL_CAPSULE | Freq: Every day | ORAL | 0 refills | Status: AC
  Filled 2024-06-12 – 2024-06-26 (×2): qty 30, 30d supply, fill #0

## 2024-06-12 MED ORDER — OXYCODONE HCL 15 MG PO TABS
15.0000 mg | ORAL_TABLET | ORAL | 0 refills | Status: AC | PRN
  Filled 2024-06-12 – 2024-06-13 (×2): qty 180, 30d supply, fill #0

## 2024-06-12 NOTE — Progress Notes (Signed)
 "  Subjective:    Patient ID: [name], female    DOB: 03/11/1976, 49 y.o.   MRN: [id number]  HPI Virtual Visit via Video Note  I connected with the patient on 06/12/2024 at  8:00 AM EST by a video enabled telemedicine application and verified that I am speaking with the correct person using two identifiers.  Location patient: home Location provider:work or home office Persons participating in the virtual visit: patient, provider  I discussed the limitations of evaluation and management by telemedicine and the availability of in person appointments. The patient expressed understanding and agreed to proceed.   HPI: Here for pain management. 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 She has been working with the [facility] in [place] for this, and they have given her a few Ketamine  infusions with some improvement. Now her insurance will not cover these, so they are considering letting her try nasal Ketamine  instead.    ROS: See pertinent positives and negatives per HPI.  Past Medical History:  Diagnosis Date   Allergy    Anxiety    Arthritis    B12 deficiency    Chronic narcotic use    Depression    not currently    Dysrhythmia    hx tachy-was from medication nortriptylline   Esophagitis    rx   Fibromyalgia    GERD (gastroesophageal reflux disease)    History of echocardiogram    Echo 4/18: Mild concentric LVH, vigorous LVF, EF 65-70, normal wall motion, grade 1 diastolic dysfunction   History of kidney stones    Hyperlipidemia    Intervertebral disc protrusion 01/11/2014   Migraine    Obesity (BMI 35.0-39.9 without comorbidity)    Peripheral neuropathy    Polycystic ovaries    Pulmonary embolus (HCC)    Spinal stenosis, lumbar    Vitamin D  deficiency     Past Surgical History:  Procedure Laterality Date   35 HOUR PH STUDY N/A 05/27/2016   Procedure: 24 HOUR PH STUDY;  Surgeon: Elspeth Deward Naval, MD;  Location: [facility];  Service: Gastroenterology;  Laterality: N/A;   ABDOMINAL EXPOSURE N/A 05/04/2018   Procedure: ABDOMINAL EXPOSURE;  Surgeon: Oris Krystal FALCON, MD;  Location: [facility] OR;  Service: Vascular;  Laterality: N/A;   ANTERIOR LUMBAR FUSION N/A 05/04/2018   Procedure: Lumbar Five Sacral One Anterior lumbar interbody fusion;  Surgeon: Onetha Kuba, MD;  Location: [facility] OR;  Service: Neurosurgery;  Laterality: N/A;  Lumbar Five Sacral One Anterior lumbar interbody fusion   APPENDECTOMY     BACK SURGERY  2012   left laminectomy at L3-4 per Dr. Amon    bladder tack  2012   CHOLECYSTECTOMY N/A 10/03/2015   Procedure: LAPAROSCOPIC CHOLECYSTECTOMY;  Surgeon: Herlene Beverley Bureau, MD;  Location: [facility] OR;  Service: General;  Laterality: N/A;   ESOPHAGEAL MANOMETRY N/A 05/27/2016   Procedure: ESOPHAGEAL MANOMETRY (EM);  Surgeon: Elspeth Deward Naval, MD;  Location: [facility];  Service: Gastroenterology;  Laterality: N/A;   EXCISION OF SKIN TAG  10/03/2015   Procedure: EXCISION OF SKIN TAG;  Surgeon: Herlene Beverley Kinsinger, MD;  Location: [facility] OR;  Service: General;;   LUMBAR DISC SURGERY  01-31-14   per Dr. Onetha    OOPHORECTOMY     left overy   OVARIAN CYST REMOVAL     RECTOCELE REPAIR     SINUSOTOMY  12/14/06   spinal fusion  2016/10   TONSILLECTOMY  TONSILLECTOMY     VAGINAL HYSTERECTOMY     WRIST GANGLION EXCISION Left     Family History  Problem Relation Age of Onset   Fibromyalgia Mother    Diabetes Mother    Thyroid  cancer Mother    Liver disease Mother    Neuropathy Mother    Cirrhosis Mother        non-alcoholic   Pulmonary embolism Mother        both lungs   Heart failure Mother    Hypertension Father    Diabetes Father    Heart disease Father    Prostate cancer Father    Heart attack Father        x 2   Colon cancer Neg Hx    Other Neg Hx        pheochromocytoma   Colon polyps Neg Hx     Current Medications[1]  EXAM:  VITALS per patient if  applicable:  GENERAL: alert, oriented, appears well and in no acute distress  HEENT: atraumatic, conjunttiva clear, no obvious abnormalities on inspection of external nose and ears  NECK: normal movements of the head and neck  LUNGS: on inspection no signs of respiratory distress, breathing rate appears normal, no obvious gross SOB, gasping or wheezing  CV: no obvious cyanosis  MS: moves all visible extremities without noticeable abnormality  PSYCH/NEURO: pleasant and cooperative, no obvious depression or anxiety, speech and thought processing grossly intact  ASSESSMENT AND PLAN: Pain management.  Indication for chronic opioid: low back pain Medication and dose: Oxycodone  15 mg and Hydromorphone  8 mg # pills per month: 180 and 30 Last UDS date: 08-27-23 Opioid Treatment Agreement signed (Y/N): 04-17-19 Opioid Treatment Agreement last reviewed with patient:  06-12-24 [facility] reviewed this encounter (include red flags): Yes Meds were refilled.  Garnette Olmsted, MD  Discussed the following assessment and plan:  No diagnosis found.     I discussed the assessment and treatment plan with the patient. The patient was provided an opportunity to ask questions and all were answered. The patient agreed with the plan and demonstrated an understanding of the instructions.   The patient was advised to call back or seek an in-person evaluation if the symptoms worsen or if the condition fails to improve as anticipated.      Review of Systems     Objective:   Physical Exam        Assessment & Plan:       [1]  Current Outpatient Medications:    albuterol  (VENTOLIN  HFA) 108 (90 Base) MCG/ACT inhaler, Inhale 2 puffs into the lungs every 6 (six) hours as needed for wheezing or shortness of breath., Disp: 8 g, Rfl: 2   ALPRAZolam  (XANAX ) 0.5 MG tablet, TAKE 1 TABLET BY MOUTH THREE TIMES A DAY AS NEEDED, Disp: 90 tablet, Rfl: 5   amphetamine -dextroamphetamine  (ADDERALL  XR) 30 MG 24 hr  capsule, Take 1 capsule (30 mg total) by mouth daily., Disp: 30 capsule, Rfl: 0   amphetamine -dextroamphetamine  (ADDERALL  XR) 30 MG 24 hr capsule, Take 1 capsule (30 mg total) by mouth daily., Disp: 30 capsule, Rfl: 0   amphetamine -dextroamphetamine  (ADDERALL  XR) 30 MG 24 hr capsule, Take 1 capsule (30 mg total) by mouth daily., Disp: 30 capsule, Rfl: 0   B-D 3CC LUER-LOK SYR 25GX1 25G X 1 3 ML MISC, USE AS DIRECTED, Disp: 9 each, Rfl: 2   cetirizine  (ZYRTEC ) 10 MG tablet, Take 1 tablet (10 mg total) by mouth daily., Disp: 90 tablet, Rfl:  3   clobetasol  (TEMOVATE ) 0.05 % external solution, clobetasol  0.05 % scalp solution, Disp: , Rfl:    cyanocobalamin  (VITAMIN B12) 1000 MCG/ML injection, Inject 1 mL (1,000 mcg total) into the muscle once a week., Disp: 30 mL, Rfl: 11   cyclobenzaprine  (FLEXERIL ) 10 MG tablet, Take 1 tablet (10 mg total) by mouth 3 (three) times daily as needed for muscle spasms., Disp: 90 tablet, Rfl: 5   esomeprazole  (NEXIUM ) 40 MG capsule, Take 1 capsule (40 mg total) by mouth daily., Disp: 90 capsule, Rfl: 3   fluconazole  (DIFLUCAN ) 150 MG tablet, TAKE 1 TABLET BY MOUTH EVERY DAY (Patient taking differently: Take 150 mg by mouth daily. Pt takes PRN), Disp: 2 tablet, Rfl: 14   Fluocinolone Acetonide Body 0.01 % OIL, fluocinolone 0.01 % scalp oil and shower cap, Disp: , Rfl:    fluticasone  (FLONASE ) 50 MCG/ACT nasal spray, Place 2 sprays into both nostrils daily., Disp: 16 g, Rfl: 6   gabapentin  (NEURONTIN ) 300 MG capsule, Take 1 capsule (300 mg total) by mouth 3 (three) times daily., Disp: 270 capsule, Rfl: 1   hydrochlorothiazide  (HYDRODIURIL ) 25 MG tablet, Take 1 tablet (25 mg total) by mouth daily., Disp: 90 tablet, Rfl: 3   HYDROmorphone  (DILAUDID ) 8 MG tablet, Take 1 tablet (8 mg) by mouth at bedtime., Disp: 30 tablet, Rfl: 0   HYDROmorphone  (DILAUDID ) 8 MG tablet, Take 1 tablet (8 mg) by mouth at bedtime., Disp: 30 tablet, Rfl: 0   HYDROmorphone  (DILAUDID ) 8 MG tablet,  Take 1 tablet (8 mg) by mouth at bedtime., Disp: 30 tablet, Rfl: 0   lidocaine  (LIDODERM ) 5 %, Place 1 patch onto the skin daily. Remove & Discard patch within 12 hours or as directed by MD, Disp: 30 patch, Rfl: 5   Multiple Vitamins-Minerals (MULTI-VITAMIN GUMMIES PO), Take 2 tablets by mouth daily., Disp: , Rfl:    oxyCODONE  (ROXICODONE ) 15 MG immediate release tablet, Take 1 tablet (15 mg total) by mouth every 4 (four) hours as needed for pain., Disp: 180 tablet, Rfl: 0   oxyCODONE  (ROXICODONE ) 15 MG immediate release tablet, Take 1 tablet (15 mg total) by mouth every 4 (four) hours as needed for pain., Disp: 180 tablet, Rfl: 0   oxyCODONE  (ROXICODONE ) 15 MG immediate release tablet, Take 1 tablet (15 mg total) by mouth every 4 (four) hours as needed for pain., Disp: 180 tablet, Rfl: 0   pantoprazole  (PROTONIX ) 40 MG tablet, Take 1 tablet (40 mg total) by mouth 2 (two) times daily before a meal., Disp: 180 tablet, Rfl: 3   phenazopyridine  (PYRIDIUM ) 100 MG tablet, Take 1 tablet (100 mg total) by mouth 3 (three) times daily as needed for pain., Disp: 10 tablet, Rfl: 0   potassium chloride  SA (KLOR-CON  M) 20 MEQ tablet, Take 2 tablets (40 mEq total) by mouth daily for 7 days., Disp: 90 tablet, Rfl: 3   promethazine  (PHENERGAN ) 25 MG tablet, Take 1 tablet (25 mg total) by mouth every 4 (four) hours as needed for nausea or vomiting., Disp: 90 tablet, Rfl: 5   Semaglutide -Weight Management (WEGOVY ) 2.4 MG/0.75ML SOAJ, Inject 2.4 mg into the skin once a week., Disp: 9 mL, Rfl: 3   Vitamin D , Ergocalciferol , (DRISDOL ) 1.25 MG (50000 UNIT) CAPS capsule, Take 1 capsule by mouth every 7 days., Disp: 12 capsule, Rfl: 3  "

## 2024-06-13 ENCOUNTER — Other Ambulatory Visit: Payer: Self-pay

## 2024-06-13 ENCOUNTER — Other Ambulatory Visit (HOSPITAL_COMMUNITY): Payer: Self-pay

## 2024-06-14 ENCOUNTER — Ambulatory Visit: Admitting: Psychology

## 2024-06-14 DIAGNOSIS — F339 Major depressive disorder, recurrent, unspecified: Secondary | ICD-10-CM | POA: Diagnosis not present

## 2024-06-14 NOTE — Progress Notes (Signed)
 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 (Status: maintained) -- No Description Entered  Medication Status compliance  Safety none  If Suicidal or Homicidal State Action Taken: unspecified  Current Risk: low Medications unspecified Objectives Related Problem: Partners verbally express empathy toward each other and demonstrate shared responsibility for reconstructing the relationship. Description: Clarify what level of trust exists for various aspects of the relationship. Target Date: 2025-05-27 Frequency: Daily Modality: individual Progress: 80%  Related Problem: Partners verbally express empathy toward each other and demonstrate shared responsibility for reconstructing the relationship. Description: List relationship issues that contribute to dissatisfaction and unhappiness and agree to address these in future conjoint sessions. Target Date: 2025-05-27 Frequency: Daily Modality: individual Progress: 75%  Related Problem: Partners verbally express empathy toward each other and demonstrate shared responsibility for reconstructing the relationship. Description: Identify and list behavioral changes for self and partner that would enhance the relationship. Target Date: 2025-05-27 Frequency: Daily Modality:  individual Progress: 85%  Related Problem: Partners verbally express empathy toward each other and demonstrate shared responsibility for reconstructing the relationship. Description: Describe pre-affair relationship functioning. Target Date: 2025-05-27 Frequency: Daily Modality: individual Progress: 90%  Related Problem: Partners verbally express empathy toward each other and demonstrate shared responsibility for reconstructing the relationship. Description: Ask the partners to generate factors that they believe contributed to the affair. Target Date: 2025-05-27 Frequency: Daily Modality: individual Progress: 90%   Problem: Partners verbally express empathy toward each other and demonstrate shared responsibility for reconstructing the relationship. Description: Hurt partner uses anxiety-management techniques to deal with intrusive thoughts about the affair. Target Date: 2025-05-27 Frequency: Daily Modality: individual Progress: 75%  Client Response full compliance  Service Location Location, [address] [place], [place] [address]  Service Code cpt [phone]  Self care activities  Provide education, information  Emotion regulation skills  Validate/empathize  Identify/label emotions  Facilitate problem solving  Comments  Patient agrees to a video (Caregility) session and understands the limitations of this platform. Patient is at home and provider in his office.   Dx.: Depression  Meds: Xanax . Tried antidep. meds, but she has significant side-effects. Goals: They are seeking counseling to resolve significant marital conflict secondary to infidelity. Cena has experienced symptoms of both anxiety and depression during this period and is  hoping to reduce these symptoms as the relationship issues improve. Marolyn is experiencing some depressive symptoms as well, along with considerable anger and frustration. Goal date 12-26   [name] and Marolyn: Cena is distressed about the  situation of the woman in [place]  and doesn't trust she is hearing the truth. She feels Marolyn continues to lie to her yet she isn't willing to leave the relationship. This trigger has set in motion a regression that we need to address.                                                                  CONI ALM KERNS, Ph.D.  Time 5:15p-6:00p 45 minutes                           [                            "

## 2024-06-20 ENCOUNTER — Ambulatory Visit: Admitting: Psychology

## 2024-06-20 DIAGNOSIS — F339 Major depressive disorder, recurrent, unspecified: Secondary | ICD-10-CM | POA: Diagnosis not present

## 2024-06-20 NOTE — Progress Notes (Signed)
 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 (Status: maintained) -- No Description Entered  Medication Status compliance  Safety none  If Suicidal or Homicidal State Action Taken: unspecified  Current Risk: low Medications unspecified Objectives Related Problem: Partners verbally express empathy toward each other and demonstrate shared responsibility for reconstructing the relationship. Description: Clarify what level of trust exists for various aspects of the relationship. Target Date: 2025-05-27 Frequency: Daily Modality: individual Progress: 80%  Related Problem: Partners verbally express empathy toward each other and demonstrate shared responsibility for reconstructing the relationship. Description: List relationship issues that contribute to dissatisfaction and unhappiness and agree to address these in future conjoint sessions. Target Date: 2025-05-27 Frequency: Daily Modality: individual Progress: 75%  Related Problem: Partners verbally express empathy toward each other and demonstrate shared responsibility for reconstructing the relationship. Description: Identify and list behavioral changes for self and partner that would enhance the relationship. Target Date: 2025-05-27 Frequency:  Daily Modality: individual Progress: 85%  Related Problem: Partners verbally express empathy toward each other and demonstrate shared responsibility for reconstructing the relationship. Description: Describe pre-affair relationship functioning. Target Date: 2025-05-27 Frequency: Daily Modality: individual Progress: 90%  Related Problem: Partners verbally express empathy toward each other and demonstrate shared responsibility for reconstructing the relationship. Description: Ask the partners to generate factors that they believe contributed to the affair. Target Date: 2025-05-27 Frequency: Daily Modality: individual Progress: 90%   Problem: Partners verbally express empathy toward each other and demonstrate shared responsibility for reconstructing the relationship. Description: Hurt partner uses anxiety-management techniques to deal with intrusive thoughts about the affair. Target Date: 2025-05-27 Frequency: Daily Modality: individual Progress: 75%  Client Response full compliance  Service Location Location, [address] [place], [place] [address]  Service Code cpt [phone]  Self care activities  Provide education, information  Emotion regulation skills  Validate/empathize  Identify/label emotions  Facilitate problem solving  Comments  Patient agrees to a video (Caregility) session and understands the limitations of this platform. Patient is at home and provider in his office.   Dx.: Depression  Meds: Xanax . Tried antidep. meds, but she has significant side-effects. Goals: They are seeking counseling to resolve significant marital conflict secondary to  infidelity. Cena has experienced symptoms of both anxiety and depression during this period and is hoping to reduce these symptoms as the relationship issues improve. Marolyn is experiencing some depressive symptoms as well, along with considerable anger and frustration. Goal date 12-26   Cena and [name]: Their daughter is  getting married this weekend and they are both invited. They got into an argument that was clearly a substitute from some other situation. We deconstructed the argument, but were unable to determine how the interactions with each other escalated so quickly. Will pick up at next session.                                                                      CONI ALM KERNS, Ph.D.  Time 4:15p-5:00p 45 minutes                           [                            "

## 2024-06-22 ENCOUNTER — Other Ambulatory Visit: Payer: Self-pay | Admitting: Family Medicine

## 2024-06-26 ENCOUNTER — Other Ambulatory Visit (HOSPITAL_COMMUNITY): Payer: Self-pay

## 2024-06-27 ENCOUNTER — Other Ambulatory Visit: Payer: Self-pay

## 2024-06-28 ENCOUNTER — Other Ambulatory Visit: Payer: Self-pay

## 2024-06-28 ENCOUNTER — Ambulatory Visit: Admitting: Psychology

## 2024-06-28 DIAGNOSIS — F339 Major depressive disorder, recurrent, unspecified: Secondary | ICD-10-CM

## 2024-06-28 NOTE — Progress Notes (Signed)
 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 (Status: maintained) -- No Description Entered  Medication Status compliance  Safety none  If Suicidal or Homicidal State Action Taken: unspecified  Current Risk: low Medications unspecified Objectives Related Problem: Partners verbally express empathy toward each other and demonstrate shared responsibility for reconstructing the relationship. Description: Clarify what level of trust exists for various aspects of the relationship. Target Date: 2025-05-27 Frequency: Daily Modality: individual Progress: 80%  Related Problem: Partners verbally express empathy toward each other and demonstrate shared responsibility for reconstructing the relationship. Description: List relationship issues that contribute to dissatisfaction and unhappiness and agree to address these in future conjoint sessions. Target Date: 2025-05-27 Frequency: Daily Modality: individual Progress: 75%  Related Problem: Partners verbally express empathy toward each other and demonstrate shared responsibility for reconstructing the relationship. Description: Identify and list behavioral changes for self and partner that would enhance the relationship. Target Date: 2025-05-27 Frequency: Daily Modality: individual Progress: 85%  Related Problem: Partners verbally express empathy toward each other and demonstrate shared responsibility for reconstructing the relationship. Description: Describe pre-affair relationship functioning. Target Date: 2025-05-27 Frequency: Daily Modality: individual Progress: 90%  Related Problem: Partners  verbally express empathy toward each other and demonstrate shared responsibility for reconstructing the relationship. Description: Ask the partners to generate factors that they believe contributed to the affair. Target Date: 2025-05-27 Frequency: Daily Modality: individual Progress: 90%   Problem: Partners verbally express empathy toward each other and demonstrate shared responsibility for reconstructing the relationship. Description: Hurt partner uses anxiety-management techniques to deal with intrusive thoughts about the affair. Target Date: 2025-05-27 Frequency: Daily Modality: individual Progress: 75%  Client Response full compliance  Service Location Location, [address] [place], [place] [address]  Service Code cpt [phone]  Self care activities  Provide education, information  Emotion regulation skills  Validate/empathize  Identify/label emotions  Facilitate problem solving  Comments  Patient agrees to a video (Caregility) session and understands the limitations of this platform. Patient is at home and provider in his office.   Dx.: Depression  Meds: Xanax . Tried antidep. meds, but she has significant side-effects. Goals: They are seeking counseling to resolve significant marital conflict secondary to infidelity. Cena has experienced symptoms of both anxiety and depression during this period and is hoping to reduce these symptoms as the relationship issues improve. Marolyn is experiencing some depressive symptoms as well, along with considerable anger and frustration. Goal date 12-26   [name] and [name]: Cena had a Ketamine  treatment for pain today. They feel it worked to help her pain. Daughter Ma got married at [employer] and it went well. She is being more accepting of Marolyn and Cena, allowing them into her social media. We discussed how to maintain positive momentum in the relationship with Mckinzie.                                                                            CONI ALM KERNS, Ph.D.  Time 5:15p-6:00p 45 minutes                           [                            "

## 2024-06-29 ENCOUNTER — Other Ambulatory Visit (HOSPITAL_COMMUNITY): Payer: Self-pay

## 2024-07-04 ENCOUNTER — Ambulatory Visit: Admitting: Psychology

## 2024-07-04 DIAGNOSIS — F4322 Adjustment disorder with anxiety: Secondary | ICD-10-CM | POA: Diagnosis not present

## 2024-07-04 DIAGNOSIS — F332 Major depressive disorder, recurrent severe without psychotic features: Secondary | ICD-10-CM

## 2024-07-04 DIAGNOSIS — F339 Major depressive disorder, recurrent, unspecified: Secondary | ICD-10-CM

## 2024-07-04 NOTE — Progress Notes (Signed)
 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 (Status: maintained) -- No Description Entered  Medication Status compliance  Safety none  If Suicidal or Homicidal State Action Taken: unspecified  Current Risk: low Medications unspecified Objectives Related Problem: Partners verbally express empathy toward each other and demonstrate shared responsibility for reconstructing the relationship. Description: Clarify what level of trust exists for various aspects of the relationship. Target Date: 2025-05-27 Frequency: Daily Modality: individual Progress: 80%  Related Problem: Partners verbally express empathy toward each other and demonstrate shared responsibility for reconstructing the relationship. Description: List relationship issues that contribute to dissatisfaction and unhappiness and agree to address these in future conjoint sessions. Target Date: 2025-05-27 Frequency: Daily Modality: individual Progress: 75%  Related Problem: Partners verbally express empathy toward each other and demonstrate shared responsibility for reconstructing the relationship. Description: Identify and list behavioral changes for self and partner that would enhance the relationship. Target Date: 2025-05-27 Frequency: Daily Modality: individual Progress: 85%  Related Problem: Partners verbally express empathy toward each other and demonstrate shared responsibility for reconstructing the relationship. Description: Describe pre-affair relationship functioning. Target Date: 2025-05-27 Frequency: Daily Modality: individual Progress:  90%  Related Problem: Partners verbally express empathy toward each other and demonstrate shared responsibility for reconstructing the relationship. Description: Ask the partners to generate factors that they believe contributed to the affair. Target Date: 2025-05-27 Frequency: Daily Modality: individual Progress: 90%   Problem: Partners verbally express empathy toward each other and demonstrate shared responsibility for reconstructing the relationship. Description: Hurt partner uses anxiety-management techniques to deal with intrusive thoughts about the affair. Target Date: 2025-05-27 Frequency: Daily Modality: individual Progress: 75%  Client Response full compliance  Service Location Location, [address] [place], [place] [address]  Service Code cpt [phone]  Self care activities  Provide education, information  Emotion regulation skills  Validate/empathize  Identify/label emotions  Facilitate problem solving  Comments  Patient agrees to a video ([employer]) session and understands the limitations of this platform. Patient is at home and provider in his office.   Dx.: Depression  Meds: Xanax . Tried antidep. meds, but she has significant side-effects. Goals: They are seeking counseling to resolve significant marital conflict secondary to infidelity. [name] has experienced symptoms of both anxiety and depression during this period and is hoping to reduce these symptoms as the relationship issues improve. [name] is experiencing some depressive symptoms as well, along with considerable anger and frustration. Goal date 12-26   [name] and [name]: [name] says that her Ketamine  treatment was helpful to her foot pain. She talked about her thoughts that [name] does not share any joy for her and her feeling that he doesn't care about him. She says that [name] hasn't planned anything for her 49th birthday in 2 days, which signals her that he doesn't care about her. She claims she has signaled  him that she would like a celebration for her birthday but he has not responded. Told them that they are repeating the same patterns and that it is not likely they will change their trust and rage. Told her she needs to go home rather than stay. [name] rages at session and  she agrees to go home.                                                                               CONI ALM KERNS, Ph.D.  Time 4:15p-5:00p 45 minutes                           [                            "

## 2024-07-12 ENCOUNTER — Ambulatory Visit: Admitting: Psychology

## 2024-07-18 ENCOUNTER — Ambulatory Visit: Admitting: Psychology

## 2024-07-26 ENCOUNTER — Ambulatory Visit: Admitting: Psychology

## 2024-08-01 ENCOUNTER — Ambulatory Visit: Admitting: Psychology

## 2024-08-09 ENCOUNTER — Ambulatory Visit: Admitting: Psychology

## 2024-08-15 ENCOUNTER — Ambulatory Visit: Admitting: Psychology

## 2024-08-23 ENCOUNTER — Ambulatory Visit: Admitting: Psychology

## 2024-08-29 ENCOUNTER — Ambulatory Visit: Admitting: Psychology

## 2024-09-06 ENCOUNTER — Ambulatory Visit: Admitting: Psychology

## 2024-09-12 ENCOUNTER — Ambulatory Visit: Admitting: Psychology

## 2024-09-20 ENCOUNTER — Ambulatory Visit: Admitting: Psychology

## 2024-09-26 ENCOUNTER — Ambulatory Visit: Admitting: Psychology

## 2024-10-04 ENCOUNTER — Ambulatory Visit: Admitting: Psychology
